# Patient Record
Sex: Female | Born: 1955 | Race: White | Hispanic: No | Smoking: Current every day smoker
Health system: Southern US, Community
[De-identification: ages and names within clinical notes are randomized; demographics above are authoritative.]

## PROBLEM LIST (undated history)

## (undated) DIAGNOSIS — I4819 Other persistent atrial fibrillation: Secondary | ICD-10-CM

## (undated) DIAGNOSIS — Z951 Presence of aortocoronary bypass graft: Secondary | ICD-10-CM

## (undated) DIAGNOSIS — R519 Headache, unspecified: Secondary | ICD-10-CM

## (undated) DIAGNOSIS — I4891 Unspecified atrial fibrillation: Secondary | ICD-10-CM

## (undated) DIAGNOSIS — I10 Essential (primary) hypertension: Secondary | ICD-10-CM

## (undated) DIAGNOSIS — R51 Headache: Secondary | ICD-10-CM

## (undated) DIAGNOSIS — I63511 Cerebral infarction due to unspecified occlusion or stenosis of right middle cerebral artery: Secondary | ICD-10-CM

## (undated) DIAGNOSIS — F329 Major depressive disorder, single episode, unspecified: Secondary | ICD-10-CM

## (undated) DIAGNOSIS — E785 Hyperlipidemia, unspecified: Secondary | ICD-10-CM

## (undated) DIAGNOSIS — R918 Other nonspecific abnormal finding of lung field: Secondary | ICD-10-CM

## (undated) DIAGNOSIS — F32A Depression, unspecified: Secondary | ICD-10-CM

## (undated) DIAGNOSIS — J449 Chronic obstructive pulmonary disease, unspecified: Secondary | ICD-10-CM

## (undated) DIAGNOSIS — I429 Cardiomyopathy, unspecified: Secondary | ICD-10-CM

## (undated) DIAGNOSIS — Z9289 Personal history of other medical treatment: Secondary | ICD-10-CM

## (undated) DIAGNOSIS — I2510 Atherosclerotic heart disease of native coronary artery without angina pectoris: Secondary | ICD-10-CM

## (undated) DIAGNOSIS — Z590 Homelessness: Secondary | ICD-10-CM

## (undated) DIAGNOSIS — Z8614 Personal history of Methicillin resistant Staphylococcus aureus infection: Secondary | ICD-10-CM

## (undated) DIAGNOSIS — IMO0002 Reserved for concepts with insufficient information to code with codable children: Secondary | ICD-10-CM

## (undated) DIAGNOSIS — L309 Dermatitis, unspecified: Secondary | ICD-10-CM

## (undated) DIAGNOSIS — K219 Gastro-esophageal reflux disease without esophagitis: Secondary | ICD-10-CM

## (undated) DIAGNOSIS — G4730 Sleep apnea, unspecified: Secondary | ICD-10-CM

## (undated) DIAGNOSIS — E039 Hypothyroidism, unspecified: Secondary | ICD-10-CM

## (undated) HISTORY — DX: Other nonspecific abnormal finding of lung field: R91.8

## (undated) HISTORY — DX: Chronic obstructive pulmonary disease, unspecified: J44.9

## (undated) HISTORY — PX: KNEE ARTHROSCOPY: SHX127

## (undated) HISTORY — DX: Personal history of Methicillin resistant Staphylococcus aureus infection: Z86.14

## (undated) HISTORY — PX: OTHER SURGICAL HISTORY: SHX169

## (undated) HISTORY — DX: Other persistent atrial fibrillation: I48.19

## (undated) HISTORY — DX: Sleep apnea, unspecified: G47.30

## (undated) HISTORY — PX: CORONARY ARTERY BYPASS GRAFT: SHX141

## (undated) HISTORY — DX: Reserved for concepts with insufficient information to code with codable children: IMO0002

## (undated) HISTORY — DX: Cerebral infarction due to unspecified occlusion or stenosis of right middle cerebral artery: I63.511

## (undated) HISTORY — PX: BLADDER SURGERY: SHX569

## (undated) HISTORY — DX: Homelessness: Z59.0

## (undated) HISTORY — DX: Hyperlipidemia, unspecified: E78.5

## (undated) HISTORY — DX: Hypothyroidism, unspecified: E03.9

## (undated) HISTORY — PX: CHEST WALL RECONSTRUCTION: SHX1339

## (undated) HISTORY — PX: HERNIA REPAIR: SHX51

---

## 1998-05-11 ENCOUNTER — Ambulatory Visit (HOSPITAL_BASED_OUTPATIENT_CLINIC_OR_DEPARTMENT_OTHER): Admission: RE | Admit: 1998-05-11 | Discharge: 1998-05-11 | Payer: Self-pay | Admitting: Orthopaedic Surgery

## 1999-03-06 ENCOUNTER — Other Ambulatory Visit: Admission: RE | Admit: 1999-03-06 | Discharge: 1999-03-06 | Payer: Self-pay | Admitting: Obstetrics and Gynecology

## 2000-04-18 ENCOUNTER — Other Ambulatory Visit: Admission: RE | Admit: 2000-04-18 | Discharge: 2000-04-18 | Payer: Self-pay | Admitting: Obstetrics and Gynecology

## 2001-05-13 ENCOUNTER — Other Ambulatory Visit: Admission: RE | Admit: 2001-05-13 | Discharge: 2001-05-13 | Payer: Self-pay | Admitting: Obstetrics and Gynecology

## 2001-09-25 ENCOUNTER — Encounter: Admission: RE | Admit: 2001-09-25 | Discharge: 2001-09-25 | Payer: Self-pay | Admitting: Otolaryngology

## 2001-09-25 ENCOUNTER — Encounter: Payer: Self-pay | Admitting: Otolaryngology

## 2002-01-08 ENCOUNTER — Ambulatory Visit (HOSPITAL_COMMUNITY): Admission: RE | Admit: 2002-01-08 | Discharge: 2002-01-08 | Payer: Self-pay | Admitting: Cardiology

## 2002-01-08 ENCOUNTER — Encounter: Payer: Self-pay | Admitting: Cardiology

## 2002-01-21 ENCOUNTER — Ambulatory Visit (HOSPITAL_BASED_OUTPATIENT_CLINIC_OR_DEPARTMENT_OTHER): Admission: RE | Admit: 2002-01-21 | Discharge: 2002-01-21 | Payer: Self-pay | Admitting: Family Medicine

## 2002-03-29 ENCOUNTER — Ambulatory Visit (HOSPITAL_BASED_OUTPATIENT_CLINIC_OR_DEPARTMENT_OTHER): Admission: RE | Admit: 2002-03-29 | Discharge: 2002-03-29 | Payer: Self-pay | Admitting: Pulmonary Disease

## 2004-07-27 ENCOUNTER — Ambulatory Visit: Payer: Self-pay | Admitting: Internal Medicine

## 2004-08-03 ENCOUNTER — Ambulatory Visit: Payer: Self-pay | Admitting: Internal Medicine

## 2004-08-27 ENCOUNTER — Ambulatory Visit: Payer: Self-pay | Admitting: Internal Medicine

## 2004-10-02 ENCOUNTER — Ambulatory Visit: Payer: Self-pay | Admitting: Internal Medicine

## 2004-10-30 ENCOUNTER — Ambulatory Visit: Payer: Self-pay | Admitting: Internal Medicine

## 2004-11-12 ENCOUNTER — Ambulatory Visit (HOSPITAL_COMMUNITY): Admission: RE | Admit: 2004-11-12 | Discharge: 2004-11-12 | Payer: Self-pay | Admitting: Internal Medicine

## 2005-02-06 ENCOUNTER — Ambulatory Visit: Payer: Self-pay | Admitting: Internal Medicine

## 2005-03-06 ENCOUNTER — Ambulatory Visit: Payer: Self-pay | Admitting: Endocrinology

## 2005-03-11 ENCOUNTER — Ambulatory Visit: Payer: Self-pay | Admitting: Internal Medicine

## 2005-03-13 ENCOUNTER — Ambulatory Visit (HOSPITAL_COMMUNITY): Admission: RE | Admit: 2005-03-13 | Discharge: 2005-03-13 | Payer: Self-pay | Admitting: Endocrinology

## 2005-03-18 ENCOUNTER — Encounter (HOSPITAL_COMMUNITY): Admission: RE | Admit: 2005-03-18 | Discharge: 2005-06-04 | Payer: Self-pay | Admitting: Endocrinology

## 2005-04-04 ENCOUNTER — Ambulatory Visit (HOSPITAL_COMMUNITY): Admission: RE | Admit: 2005-04-04 | Discharge: 2005-04-04 | Payer: Self-pay | Admitting: Endocrinology

## 2005-04-04 ENCOUNTER — Encounter (INDEPENDENT_AMBULATORY_CARE_PROVIDER_SITE_OTHER): Payer: Self-pay | Admitting: Specialist

## 2005-04-12 ENCOUNTER — Ambulatory Visit (HOSPITAL_COMMUNITY): Admission: RE | Admit: 2005-04-12 | Discharge: 2005-04-12 | Payer: Self-pay | Admitting: Endocrinology

## 2005-09-10 ENCOUNTER — Ambulatory Visit: Payer: Self-pay | Admitting: Internal Medicine

## 2005-09-12 ENCOUNTER — Ambulatory Visit: Payer: Self-pay | Admitting: Endocrinology

## 2005-09-26 ENCOUNTER — Other Ambulatory Visit: Admission: RE | Admit: 2005-09-26 | Discharge: 2005-09-26 | Payer: Self-pay | Admitting: Obstetrics & Gynecology

## 2005-11-05 ENCOUNTER — Inpatient Hospital Stay (HOSPITAL_COMMUNITY): Admission: EM | Admit: 2005-11-05 | Discharge: 2005-11-09 | Payer: Self-pay | Admitting: Emergency Medicine

## 2005-11-05 ENCOUNTER — Ambulatory Visit: Payer: Self-pay | Admitting: Cardiology

## 2005-11-07 ENCOUNTER — Encounter: Payer: Self-pay | Admitting: Internal Medicine

## 2005-11-20 ENCOUNTER — Ambulatory Visit: Payer: Self-pay | Admitting: Cardiology

## 2005-11-25 ENCOUNTER — Ambulatory Visit: Payer: Self-pay | Admitting: Endocrinology

## 2005-12-02 ENCOUNTER — Ambulatory Visit: Payer: Self-pay | Admitting: Internal Medicine

## 2005-12-25 ENCOUNTER — Ambulatory Visit: Payer: Self-pay | Admitting: Cardiology

## 2005-12-27 ENCOUNTER — Ambulatory Visit: Payer: Self-pay | Admitting: Internal Medicine

## 2006-01-08 ENCOUNTER — Ambulatory Visit: Payer: Self-pay | Admitting: Cardiology

## 2006-01-08 ENCOUNTER — Ambulatory Visit: Payer: Self-pay | Admitting: Endocrinology

## 2006-01-23 ENCOUNTER — Ambulatory Visit: Payer: Self-pay | Admitting: Cardiology

## 2006-04-15 ENCOUNTER — Ambulatory Visit: Payer: Self-pay | Admitting: Cardiology

## 2006-05-06 ENCOUNTER — Ambulatory Visit: Payer: Self-pay | Admitting: Internal Medicine

## 2006-05-28 ENCOUNTER — Ambulatory Visit: Payer: Self-pay | Admitting: Internal Medicine

## 2006-06-02 ENCOUNTER — Ambulatory Visit: Payer: Self-pay | Admitting: Internal Medicine

## 2006-06-03 ENCOUNTER — Encounter: Admission: RE | Admit: 2006-06-03 | Discharge: 2006-06-03 | Payer: Self-pay | Admitting: Internal Medicine

## 2006-06-13 ENCOUNTER — Ambulatory Visit: Payer: Self-pay | Admitting: Cardiovascular Disease

## 2006-06-13 ENCOUNTER — Inpatient Hospital Stay (HOSPITAL_COMMUNITY): Admission: EM | Admit: 2006-06-13 | Discharge: 2006-06-17 | Payer: Self-pay | Admitting: Emergency Medicine

## 2006-06-19 ENCOUNTER — Ambulatory Visit: Payer: Self-pay | Admitting: Endocrinology

## 2006-07-03 ENCOUNTER — Observation Stay (HOSPITAL_COMMUNITY): Admission: AD | Admit: 2006-07-03 | Discharge: 2006-07-04 | Payer: Self-pay | Admitting: Cardiology

## 2006-07-03 ENCOUNTER — Ambulatory Visit: Payer: Self-pay | Admitting: Cardiology

## 2006-07-11 ENCOUNTER — Ambulatory Visit: Payer: Self-pay | Admitting: Cardiology

## 2006-07-21 ENCOUNTER — Ambulatory Visit: Payer: Self-pay

## 2006-07-30 ENCOUNTER — Ambulatory Visit: Payer: Self-pay | Admitting: Cardiology

## 2006-08-06 ENCOUNTER — Inpatient Hospital Stay (HOSPITAL_COMMUNITY)
Admission: RE | Admit: 2006-08-06 | Discharge: 2006-08-11 | Payer: Self-pay | Admitting: Thoracic Surgery (Cardiothoracic Vascular Surgery)

## 2006-08-26 ENCOUNTER — Ambulatory Visit: Payer: Self-pay | Admitting: Cardiology

## 2006-09-08 ENCOUNTER — Ambulatory Visit: Payer: Self-pay | Admitting: Cardiology

## 2006-09-14 ENCOUNTER — Inpatient Hospital Stay (HOSPITAL_COMMUNITY): Admission: EM | Admit: 2006-09-14 | Discharge: 2006-10-01 | Payer: Self-pay | Admitting: Emergency Medicine

## 2006-09-18 ENCOUNTER — Ambulatory Visit: Payer: Self-pay | Admitting: Infectious Diseases

## 2006-10-13 ENCOUNTER — Encounter
Admission: RE | Admit: 2006-10-13 | Discharge: 2006-10-13 | Payer: Self-pay | Admitting: Thoracic Surgery (Cardiothoracic Vascular Surgery)

## 2006-10-13 ENCOUNTER — Ambulatory Visit: Payer: Self-pay | Admitting: Thoracic Surgery (Cardiothoracic Vascular Surgery)

## 2006-10-27 ENCOUNTER — Ambulatory Visit: Payer: Self-pay | Admitting: Infectious Diseases

## 2006-10-27 LAB — CONVERTED CEMR LAB
Eosinophils Absolute: 0.4 10*3/uL (ref 0.0–0.7)
Eosinophils Relative: 8 % — ABNORMAL HIGH (ref 0–5)
HCT: 36.6 % (ref 36.0–46.0)
Lymphs Abs: 1.2 10*3/uL (ref 0.7–3.3)
MCV: 88.8 fL (ref 78.0–100.0)
Platelets: 239 10*3/uL (ref 150–400)
RDW: 15.3 % — ABNORMAL HIGH (ref 11.5–14.0)
Sed Rate: 46 mm/hr — ABNORMAL HIGH (ref 0–22)
WBC: 5.6 10*3/uL (ref 4.0–10.5)

## 2006-10-28 ENCOUNTER — Ambulatory Visit: Payer: Self-pay | Admitting: Internal Medicine

## 2006-10-28 DIAGNOSIS — E039 Hypothyroidism, unspecified: Secondary | ICD-10-CM

## 2006-10-28 DIAGNOSIS — J45909 Unspecified asthma, uncomplicated: Secondary | ICD-10-CM | POA: Insufficient documentation

## 2006-10-28 DIAGNOSIS — I2510 Atherosclerotic heart disease of native coronary artery without angina pectoris: Secondary | ICD-10-CM | POA: Insufficient documentation

## 2006-10-28 DIAGNOSIS — G4733 Obstructive sleep apnea (adult) (pediatric): Secondary | ICD-10-CM | POA: Insufficient documentation

## 2006-10-28 DIAGNOSIS — I10 Essential (primary) hypertension: Secondary | ICD-10-CM | POA: Insufficient documentation

## 2006-11-26 ENCOUNTER — Ambulatory Visit: Payer: Self-pay | Admitting: Internal Medicine

## 2006-12-08 ENCOUNTER — Ambulatory Visit: Payer: Self-pay | Admitting: Thoracic Surgery (Cardiothoracic Vascular Surgery)

## 2006-12-15 ENCOUNTER — Encounter
Admission: RE | Admit: 2006-12-15 | Discharge: 2006-12-15 | Payer: Self-pay | Admitting: Thoracic Surgery (Cardiothoracic Vascular Surgery)

## 2006-12-15 ENCOUNTER — Ambulatory Visit: Payer: Self-pay | Admitting: Thoracic Surgery (Cardiothoracic Vascular Surgery)

## 2006-12-30 ENCOUNTER — Ambulatory Visit: Payer: Self-pay | Admitting: Endocrinology

## 2007-01-01 ENCOUNTER — Encounter (HOSPITAL_COMMUNITY): Admission: RE | Admit: 2007-01-01 | Discharge: 2007-02-26 | Payer: Self-pay | Admitting: Cardiology

## 2007-02-06 ENCOUNTER — Encounter: Admission: RE | Admit: 2007-02-06 | Discharge: 2007-02-06 | Payer: Self-pay | Admitting: Cardiothoracic Surgery

## 2007-02-06 ENCOUNTER — Ambulatory Visit: Payer: Self-pay | Admitting: Cardiothoracic Surgery

## 2007-02-13 ENCOUNTER — Ambulatory Visit: Payer: Self-pay | Admitting: Cardiothoracic Surgery

## 2007-02-23 ENCOUNTER — Ambulatory Visit: Payer: Self-pay | Admitting: Thoracic Surgery (Cardiothoracic Vascular Surgery)

## 2007-03-02 ENCOUNTER — Ambulatory Visit: Payer: Self-pay | Admitting: Internal Medicine

## 2007-03-17 ENCOUNTER — Ambulatory Visit: Payer: Self-pay | Admitting: Cardiology

## 2007-03-23 ENCOUNTER — Ambulatory Visit: Payer: Self-pay | Admitting: Internal Medicine

## 2007-03-23 ENCOUNTER — Ambulatory Visit: Payer: Self-pay | Admitting: Cardiology

## 2007-03-23 LAB — CONVERTED CEMR LAB
ALT: 17 units/L (ref 0–35)
AST: 18 units/L (ref 0–37)
Basophils Relative: 0.3 % (ref 0.0–1.0)
Bilirubin, Direct: 0.1 mg/dL (ref 0.0–0.3)
CO2: 31 meq/L (ref 19–32)
Calcium: 9.8 mg/dL (ref 8.4–10.5)
Chloride: 104 meq/L (ref 96–112)
Creatinine, Ser: 0.9 mg/dL (ref 0.4–1.2)
Eosinophils Relative: 2.4 % (ref 0.0–5.0)
Glucose, Bld: 112 mg/dL — ABNORMAL HIGH (ref 70–99)
Lymphocytes Relative: 14.1 % (ref 12.0–46.0)
Platelets: 220 10*3/uL (ref 150–400)
RBC: 3.64 M/uL — ABNORMAL LOW (ref 3.87–5.11)
RDW: 12.2 % (ref 11.5–14.6)
Total CHOL/HDL Ratio: 4.2
Total Protein: 7.3 g/dL (ref 6.0–8.3)
Triglycerides: 134 mg/dL (ref 0–149)
VLDL: 27 mg/dL (ref 0–40)
WBC: 5.7 10*3/uL (ref 4.5–10.5)

## 2007-03-25 ENCOUNTER — Encounter: Admission: RE | Admit: 2007-03-25 | Discharge: 2007-03-25 | Payer: Self-pay | Admitting: Internal Medicine

## 2007-03-25 ENCOUNTER — Encounter: Payer: Self-pay | Admitting: Internal Medicine

## 2007-03-30 ENCOUNTER — Ambulatory Visit: Payer: Self-pay | Admitting: Internal Medicine

## 2007-03-30 ENCOUNTER — Encounter: Payer: Self-pay | Admitting: Internal Medicine

## 2007-03-30 DIAGNOSIS — G43909 Migraine, unspecified, not intractable, without status migrainosus: Secondary | ICD-10-CM | POA: Insufficient documentation

## 2007-04-06 ENCOUNTER — Ambulatory Visit: Payer: Self-pay | Admitting: Thoracic Surgery (Cardiothoracic Vascular Surgery)

## 2007-06-02 ENCOUNTER — Ambulatory Visit: Payer: Self-pay | Admitting: Internal Medicine

## 2007-06-02 ENCOUNTER — Encounter: Payer: Self-pay | Admitting: Internal Medicine

## 2007-06-09 ENCOUNTER — Ambulatory Visit: Payer: Self-pay | Admitting: Cardiology

## 2007-06-09 LAB — CONVERTED CEMR LAB
Albumin: 3.3 g/dL — ABNORMAL LOW (ref 3.5–5.2)
Bilirubin, Direct: 0.1 mg/dL (ref 0.0–0.3)
HDL: 44.2 mg/dL (ref >39.0)
Total Bilirubin: 0.6 mg/dL (ref 0.3–1.2)
Total CHOL/HDL Ratio: 3.7
Triglycerides: 116 mg/dL (ref 0–149)
VLDL: 23 mg/dL (ref 0–40)

## 2007-06-15 ENCOUNTER — Ambulatory Visit: Payer: Self-pay | Admitting: Cardiology

## 2007-06-24 ENCOUNTER — Encounter
Admission: RE | Admit: 2007-06-24 | Discharge: 2007-06-24 | Payer: Self-pay | Admitting: Thoracic Surgery (Cardiothoracic Vascular Surgery)

## 2007-07-06 ENCOUNTER — Ambulatory Visit: Payer: Self-pay | Admitting: Internal Medicine

## 2007-07-06 DIAGNOSIS — F4321 Adjustment disorder with depressed mood: Secondary | ICD-10-CM

## 2007-07-06 DIAGNOSIS — R071 Chest pain on breathing: Secondary | ICD-10-CM

## 2007-07-31 ENCOUNTER — Ambulatory Visit: Payer: Self-pay | Admitting: Internal Medicine

## 2007-08-05 ENCOUNTER — Ambulatory Visit: Payer: Self-pay | Admitting: Internal Medicine

## 2007-08-10 ENCOUNTER — Ambulatory Visit: Payer: Self-pay | Admitting: Physical Medicine & Rehabilitation

## 2007-08-10 ENCOUNTER — Encounter
Admission: RE | Admit: 2007-08-10 | Discharge: 2007-08-11 | Payer: Self-pay | Admitting: Physical Medicine & Rehabilitation

## 2007-09-14 ENCOUNTER — Ambulatory Visit: Payer: Self-pay | Admitting: Physical Medicine & Rehabilitation

## 2007-10-14 ENCOUNTER — Telehealth (INDEPENDENT_AMBULATORY_CARE_PROVIDER_SITE_OTHER): Payer: Self-pay | Admitting: *Deleted

## 2007-10-14 ENCOUNTER — Telehealth: Payer: Self-pay | Admitting: Internal Medicine

## 2007-10-19 ENCOUNTER — Telehealth: Payer: Self-pay | Admitting: *Deleted

## 2007-11-03 ENCOUNTER — Ambulatory Visit: Payer: Self-pay | Admitting: Internal Medicine

## 2007-12-02 ENCOUNTER — Encounter: Payer: Self-pay | Admitting: Internal Medicine

## 2008-01-28 ENCOUNTER — Encounter: Payer: Self-pay | Admitting: Internal Medicine

## 2008-02-04 ENCOUNTER — Telehealth: Payer: Self-pay | Admitting: *Deleted

## 2008-04-18 ENCOUNTER — Telehealth: Payer: Self-pay | Admitting: *Deleted

## 2008-05-11 ENCOUNTER — Ambulatory Visit: Payer: Self-pay | Admitting: Cardiology

## 2008-07-06 ENCOUNTER — Telehealth: Payer: Self-pay | Admitting: Internal Medicine

## 2008-07-12 ENCOUNTER — Ambulatory Visit: Payer: Self-pay | Admitting: Internal Medicine

## 2008-08-17 ENCOUNTER — Ambulatory Visit: Payer: Self-pay | Admitting: Internal Medicine

## 2008-10-25 ENCOUNTER — Ambulatory Visit: Payer: Self-pay | Admitting: Internal Medicine

## 2008-11-01 LAB — CONVERTED CEMR LAB
AST: 26 units/L (ref 0–37)
Albumin: 3.6 g/dL (ref 3.5–5.2)
Alkaline Phosphatase: 66 units/L (ref 39–117)
BUN: 14 mg/dL (ref 6–23)
Chloride: 109 meq/L (ref 96–112)
Cholesterol: 145 mg/dL (ref 0–200)
Creatinine, Ser: 0.9 mg/dL (ref 0.4–1.2)
GFR calc non Af Amer: 70 mL/min
Glucose, Bld: 100 mg/dL — ABNORMAL HIGH (ref 70–99)
LDL Cholesterol: 75 mg/dL (ref 0–99)
TSH: 0.72 microintl units/mL (ref 0.35–5.50)
Total Protein: 6.8 g/dL (ref 6.0–8.3)
Triglycerides: 163 mg/dL — ABNORMAL HIGH (ref 0–149)
VLDL: 33 mg/dL (ref 0–40)

## 2008-11-28 ENCOUNTER — Telehealth: Payer: Self-pay | Admitting: Internal Medicine

## 2008-12-06 ENCOUNTER — Ambulatory Visit: Payer: Self-pay | Admitting: Internal Medicine

## 2008-12-07 LAB — CONVERTED CEMR LAB
BUN: 10 mg/dL (ref 6–23)
Calcium: 7.7 mg/dL — ABNORMAL LOW (ref 8.4–10.5)
Chloride: 105 meq/L (ref 96–112)
Creatinine, Ser: 0.9 mg/dL (ref 0.4–1.2)
GFR calc non Af Amer: 70.11 mL/min (ref >60)

## 2009-01-02 ENCOUNTER — Telehealth (INDEPENDENT_AMBULATORY_CARE_PROVIDER_SITE_OTHER): Payer: Self-pay | Admitting: *Deleted

## 2009-01-05 ENCOUNTER — Ambulatory Visit: Payer: Self-pay | Admitting: Internal Medicine

## 2009-01-05 LAB — CONVERTED CEMR LAB
Calcium Ionized: 1.32 mmol/L (ref 1.12–1.32)
Magnesium: 2 mg/dL (ref 1.5–2.5)

## 2009-01-09 ENCOUNTER — Telehealth: Payer: Self-pay | Admitting: Internal Medicine

## 2009-01-09 LAB — CONVERTED CEMR LAB
Calcium: 9.6 mg/dL (ref 8.4–10.5)
GFR calc non Af Amer: 70.09 mL/min (ref >60)
Potassium: 4.5 meq/L (ref 3.5–5.1)
Sodium: 139 meq/L (ref 135–145)

## 2009-01-16 ENCOUNTER — Telehealth: Payer: Self-pay | Admitting: Internal Medicine

## 2009-01-26 ENCOUNTER — Telehealth: Payer: Self-pay | Admitting: Internal Medicine

## 2009-01-29 ENCOUNTER — Encounter: Payer: Self-pay | Admitting: Internal Medicine

## 2009-02-10 ENCOUNTER — Telehealth: Payer: Self-pay | Admitting: Internal Medicine

## 2009-02-15 ENCOUNTER — Ambulatory Visit: Payer: Self-pay | Admitting: Internal Medicine

## 2009-02-16 ENCOUNTER — Encounter: Payer: Self-pay | Admitting: Internal Medicine

## 2009-03-01 ENCOUNTER — Telehealth (INDEPENDENT_AMBULATORY_CARE_PROVIDER_SITE_OTHER): Payer: Self-pay | Admitting: *Deleted

## 2009-03-01 ENCOUNTER — Telehealth: Payer: Self-pay | Admitting: Internal Medicine

## 2009-04-03 ENCOUNTER — Telehealth: Payer: Self-pay | Admitting: Internal Medicine

## 2009-04-18 ENCOUNTER — Ambulatory Visit: Payer: Self-pay | Admitting: Thoracic Surgery (Cardiothoracic Vascular Surgery)

## 2009-05-10 ENCOUNTER — Telehealth: Payer: Self-pay | Admitting: Internal Medicine

## 2009-05-23 ENCOUNTER — Encounter: Payer: Self-pay | Admitting: Internal Medicine

## 2009-05-29 ENCOUNTER — Telehealth (INDEPENDENT_AMBULATORY_CARE_PROVIDER_SITE_OTHER): Payer: Self-pay | Admitting: *Deleted

## 2009-07-10 ENCOUNTER — Encounter: Payer: Self-pay | Admitting: Internal Medicine

## 2009-07-13 ENCOUNTER — Ambulatory Visit: Payer: Self-pay | Admitting: Internal Medicine

## 2009-07-19 ENCOUNTER — Telehealth (INDEPENDENT_AMBULATORY_CARE_PROVIDER_SITE_OTHER): Payer: Self-pay | Admitting: *Deleted

## 2009-07-21 ENCOUNTER — Ambulatory Visit: Payer: Self-pay | Admitting: Internal Medicine

## 2009-07-21 LAB — CONVERTED CEMR LAB
Basophils Relative: 0.9 % (ref 0.0–3.0)
Eosinophils Relative: 2.5 % (ref 0.0–5.0)
Lymphocytes Relative: 20.1 % (ref 12.0–46.0)
Monocytes Absolute: 0.7 10*3/uL (ref 0.1–1.0)
Neutrophils Relative %: 67 % (ref 43.0–77.0)
Platelets: 238 10*3/uL (ref 150.0–400.0)
Pro B Natriuretic peptide (BNP): 162 pg/mL — ABNORMAL HIGH (ref 0.0–100.0)
RBC: 3.86 M/uL — ABNORMAL LOW (ref 3.87–5.11)
WBC: 7.3 10*3/uL (ref 4.5–10.5)

## 2009-08-14 ENCOUNTER — Telehealth: Payer: Self-pay | Admitting: *Deleted

## 2009-08-22 ENCOUNTER — Ambulatory Visit: Payer: Self-pay | Admitting: Internal Medicine

## 2009-12-20 ENCOUNTER — Telehealth (INDEPENDENT_AMBULATORY_CARE_PROVIDER_SITE_OTHER): Payer: Self-pay | Admitting: *Deleted

## 2009-12-20 ENCOUNTER — Ambulatory Visit: Payer: Self-pay | Admitting: Internal Medicine

## 2009-12-22 LAB — CONVERTED CEMR LAB
CO2: 30 meq/L (ref 19–32)
Calcium: 8.6 mg/dL (ref 8.4–10.5)
GFR calc non Af Amer: 79.99 mL/min (ref >60)
Glucose, Bld: 105 mg/dL — ABNORMAL HIGH (ref 70–99)
Potassium: 3.6 meq/L (ref 3.5–5.1)
Sodium: 141 meq/L (ref 135–145)

## 2009-12-27 ENCOUNTER — Encounter: Payer: Self-pay | Admitting: Internal Medicine

## 2009-12-27 ENCOUNTER — Ambulatory Visit (HOSPITAL_COMMUNITY): Admission: RE | Admit: 2009-12-27 | Discharge: 2009-12-27 | Payer: Self-pay | Admitting: Internal Medicine

## 2009-12-27 ENCOUNTER — Ambulatory Visit: Payer: Self-pay | Admitting: Cardiology

## 2010-01-15 ENCOUNTER — Ambulatory Visit: Payer: Self-pay | Admitting: Internal Medicine

## 2010-01-15 LAB — CONVERTED CEMR LAB
ALT: 32 units/L (ref 0–35)
Bilirubin, Direct: 0.1 mg/dL (ref 0.0–0.3)
Direct LDL: 153 mg/dL
HDL: 47.2 mg/dL (ref >39.00)
TSH: 11.16 microintl units/mL — ABNORMAL HIGH (ref 0.35–5.50)
Total Bilirubin: 0.7 mg/dL (ref 0.3–1.2)
Total Protein: 7 g/dL (ref 6.0–8.3)
Triglycerides: 126 mg/dL (ref 0.0–149.0)
VLDL: 25.2 mg/dL (ref 0.0–40.0)

## 2010-01-19 ENCOUNTER — Ambulatory Visit: Payer: Self-pay | Admitting: Internal Medicine

## 2010-01-24 ENCOUNTER — Ambulatory Visit: Payer: Self-pay | Admitting: Internal Medicine

## 2010-03-02 ENCOUNTER — Ambulatory Visit: Payer: Self-pay | Admitting: Internal Medicine

## 2010-03-05 ENCOUNTER — Encounter: Payer: Self-pay | Admitting: Internal Medicine

## 2010-03-16 ENCOUNTER — Encounter: Payer: Self-pay | Admitting: Internal Medicine

## 2010-03-27 ENCOUNTER — Ambulatory Visit: Payer: Self-pay | Admitting: Internal Medicine

## 2010-04-03 LAB — CONVERTED CEMR LAB
ALT: 23 units/L (ref 0–35)
AST: 18 units/L (ref 0–37)
Bilirubin, Direct: 0.1 mg/dL (ref 0.0–0.3)
Cholesterol: 137 mg/dL (ref 0–200)
Total Bilirubin: 0.7 mg/dL (ref 0.3–1.2)
Total Protein: 6.9 g/dL (ref 6.0–8.3)

## 2010-06-14 ENCOUNTER — Ambulatory Visit: Payer: Self-pay | Admitting: Internal Medicine

## 2010-06-15 ENCOUNTER — Telehealth: Payer: Self-pay | Admitting: *Deleted

## 2010-06-15 LAB — CONVERTED CEMR LAB
BUN: 10 mg/dL (ref 6–23)
Basophils Absolute: 0 10*3/uL (ref 0.0–0.1)
CO2: 29 meq/L (ref 19–32)
Calcium: 8.2 mg/dL — ABNORMAL LOW (ref 8.4–10.5)
Creatinine, Ser: 0.9 mg/dL (ref 0.4–1.2)
Eosinophils Absolute: 0.2 10*3/uL (ref 0.0–0.7)
GFR calc non Af Amer: 73.45 mL/min (ref >60)
Glucose, Bld: 74 mg/dL (ref 70–99)
Hgb A1c MFr Bld: 5.4 % (ref 4.6–6.5)
Lymphocytes Relative: 19.1 % (ref 12.0–46.0)
MCHC: 35.1 g/dL (ref 30.0–36.0)
Neutro Abs: 4.8 10*3/uL (ref 1.4–7.7)
Neutrophils Relative %: 69.1 % (ref 43.0–77.0)
Platelets: 262 10*3/uL (ref 150.0–400.0)
RDW: 13.4 % (ref 11.5–14.6)
TSH: 0.53 microintl units/mL (ref 0.35–5.50)

## 2010-06-26 ENCOUNTER — Ambulatory Visit: Payer: Self-pay | Admitting: Internal Medicine

## 2010-07-04 ENCOUNTER — Telehealth: Payer: Self-pay | Admitting: Internal Medicine

## 2010-07-04 LAB — CONVERTED CEMR LAB
BUN: 15 mg/dL (ref 6–23)
Calcium: 9.5 mg/dL (ref 8.4–10.5)
Chloride: 108 meq/L (ref 96–112)
Creatinine, Ser: 0.9 mg/dL (ref 0.4–1.2)
Magnesium: 1.6 mg/dL (ref 1.5–2.5)

## 2010-07-13 ENCOUNTER — Telehealth: Payer: Self-pay | Admitting: Cardiology

## 2010-08-06 ENCOUNTER — Ambulatory Visit: Payer: Self-pay | Admitting: Cardiology

## 2010-08-10 ENCOUNTER — Ambulatory Visit: Payer: Self-pay | Admitting: Cardiology

## 2010-08-10 ENCOUNTER — Ambulatory Visit (HOSPITAL_COMMUNITY)
Admission: RE | Admit: 2010-08-10 | Discharge: 2010-08-10 | Payer: Self-pay | Source: Home / Self Care | Admitting: Cardiology

## 2010-08-13 ENCOUNTER — Encounter: Payer: Self-pay | Admitting: Cardiology

## 2010-08-16 ENCOUNTER — Telehealth: Payer: Self-pay | Admitting: Cardiology

## 2010-08-17 ENCOUNTER — Encounter: Payer: Self-pay | Admitting: Cardiology

## 2010-08-22 ENCOUNTER — Encounter: Payer: Self-pay | Admitting: Cardiology

## 2010-08-24 ENCOUNTER — Ambulatory Visit: Payer: Self-pay | Admitting: Cardiology

## 2010-08-29 ENCOUNTER — Ambulatory Visit: Payer: Self-pay | Admitting: Internal Medicine

## 2010-09-04 ENCOUNTER — Telehealth (INDEPENDENT_AMBULATORY_CARE_PROVIDER_SITE_OTHER): Payer: Self-pay | Admitting: *Deleted

## 2010-09-05 ENCOUNTER — Ambulatory Visit: Payer: Self-pay | Admitting: Cardiology

## 2010-09-09 HISTORY — PX: CARDIAC CATHETERIZATION: SHX172

## 2010-09-22 ENCOUNTER — Ambulatory Visit
Admission: RE | Admit: 2010-09-22 | Discharge: 2010-09-22 | Payer: Self-pay | Source: Home / Self Care | Attending: Family Medicine | Admitting: Family Medicine

## 2010-09-22 DIAGNOSIS — K115 Sialolithiasis: Secondary | ICD-10-CM | POA: Insufficient documentation

## 2010-09-24 ENCOUNTER — Other Ambulatory Visit: Payer: Self-pay | Admitting: Cardiology

## 2010-09-24 ENCOUNTER — Ambulatory Visit: Admission: RE | Admit: 2010-09-24 | Discharge: 2010-09-24 | Payer: Self-pay | Source: Home / Self Care

## 2010-09-25 LAB — BASIC METABOLIC PANEL
BUN: 15 mg/dL (ref 6–23)
CO2: 27 mEq/L (ref 19–32)
Calcium: 9.4 mg/dL (ref 8.4–10.5)
Chloride: 105 mEq/L (ref 96–112)
Creatinine, Ser: 1 mg/dL (ref 0.4–1.2)
GFR: 65.41 mL/min (ref >60.00)
Glucose, Bld: 76 mg/dL (ref 70–99)
Potassium: 3.8 mEq/L (ref 3.5–5.1)
Sodium: 141 mEq/L (ref 135–145)

## 2010-09-25 LAB — CBC WITH DIFFERENTIAL/PLATELET
Basophils Absolute: 0 10*3/uL (ref 0.0–0.1)
Basophils Relative: 0.5 % (ref 0.0–3.0)
Eosinophils Absolute: 0.2 10*3/uL (ref 0.0–0.7)
Eosinophils Relative: 3.2 % (ref 0.0–5.0)
HCT: 37.6 % (ref 36.0–46.0)
Hemoglobin: 13.1 g/dL (ref 12.0–15.0)
Lymphocytes Relative: 23 % (ref 12.0–46.0)
Lymphs Abs: 1.6 10*3/uL (ref 0.7–4.0)
MCHC: 34.8 g/dL (ref 30.0–36.0)
MCV: 92.6 fl (ref 78.0–100.0)
Monocytes Absolute: 0.6 10*3/uL (ref 0.1–1.0)
Monocytes Relative: 7.9 % (ref 3.0–12.0)
Neutro Abs: 4.7 10*3/uL (ref 1.4–7.7)
Neutrophils Relative %: 65.4 % (ref 43.0–77.0)
Platelets: 266 10*3/uL (ref 150.0–400.0)
RBC: 4.05 Mil/uL (ref 3.87–5.11)
RDW: 14.5 % (ref 11.5–14.6)
WBC: 7.1 10*3/uL (ref 4.5–10.5)

## 2010-09-25 LAB — PROTIME-INR
INR: 1.1 ratio — ABNORMAL HIGH (ref 0.8–1.0)
Prothrombin Time: 12.2 s (ref 10.2–12.4)

## 2010-09-27 ENCOUNTER — Ambulatory Visit
Admission: RE | Admit: 2010-09-27 | Discharge: 2010-09-27 | Payer: Self-pay | Source: Home / Self Care | Attending: Cardiology | Admitting: Cardiology

## 2010-09-29 ENCOUNTER — Encounter: Payer: Self-pay | Admitting: Endocrinology

## 2010-09-30 ENCOUNTER — Encounter: Payer: Self-pay | Admitting: Cardiology

## 2010-09-30 ENCOUNTER — Encounter: Payer: Self-pay | Admitting: Thoracic Surgery (Cardiothoracic Vascular Surgery)

## 2010-10-01 ENCOUNTER — Ambulatory Visit: Admit: 2010-10-01 | Payer: Self-pay | Admitting: Cardiology

## 2010-10-01 ENCOUNTER — Encounter: Payer: Self-pay | Admitting: Cardiology

## 2010-10-02 ENCOUNTER — Ambulatory Visit
Admission: RE | Admit: 2010-10-02 | Discharge: 2010-10-02 | Payer: Self-pay | Source: Home / Self Care | Attending: Internal Medicine | Admitting: Internal Medicine

## 2010-10-02 ENCOUNTER — Other Ambulatory Visit: Payer: Self-pay | Admitting: Internal Medicine

## 2010-10-02 LAB — MAGNESIUM: Magnesium: 1.7 mg/dL (ref 1.5–2.5)

## 2010-10-03 ENCOUNTER — Ambulatory Visit: Payer: Self-pay | Admitting: Cardiovascular Disease

## 2010-10-04 NOTE — Procedures (Addendum)
NAMEKENSLEI, HEARTY NO.:  [id number]  MEDICAL RECORD NO.:  [id number]          PATIENT TYPE:  OIB  LOCATION:  1962                         FACILITY:  [facility]  PHYSICIAN:  Arturo Morton. Riley Kill, MD, FACCDATE OF BIRTH:  Apr 23, 1956  DATE OF PROCEDURE:  09/27/2010 DATE OF DISCHARGE:  09/27/2010                           CARDIAC CATHETERIZATION   INDICATIONS:  Ms. Shackleton is a 55 year old.  She presents with some recurrent chest tightness associated with exertion.  She has been evaluated in the office.  CT has suggested COPD and central lobular emphysema with the upper limits of normal pulmonary artery.  However, her echocardiogram has demonstrated no increased transvalvular gradient across the tricuspid valve.  The patient has known obstructive sleep apnea and has been on CPAP.  She has had a V/Q scan to rule out a pulmonary MRI.  The current study was done to assess her grafts.  Her revascularization surgery was done in 2007.  She has been a longstanding smoker.  She was brought to the laboratory for further evaluation.  PROCEDURES: 1. Left heart catheterization. 2. Selective coronary arteriography. 3. Selective left ventriculography. 4. Saphenous vein graft angiography. 5. Selective left internal mammary angiography.  DESCRIPTION OF PROCEDURE:  She was brought to the Catheterization Laboratory and prepped and draped in the usual fashion.  We have extremely difficult time getting access to the femoral artery.  The vessels were really quite deep.  I took as about a part of an hour and we switched groins to the left side.  Ultimately, we were able to get access up to the left femoral artery and a 4-French sheath was placed. Views of the left and right coronary arteries were obtained in multiple angiographic projections.  The vein grafts and internal mammary were injected as well.  Central aortic and left ventricular pressures were measured with pigtail.   Ventriculography was performed in the RAO projection.  On top of this, we then tried for short period to get access to the femoral vein, but the patient had pretty considerable pain with attempts at access and so we elected not to continue.  She was taken to the holding area in satisfactory clinical condition after sheath removal.  HEMODYNAMIC DATA: 1. Central weight pressure is 144/81, mean 107. 2. LV pressure 159/22. 3. No gradient or pullback across the aortic valve.  ANGIOGRAPHIC DATA: 1. The left main is free of critical disease. 2. The left anterior descending artery has a previously placed stent     in its midportion.  There is probably about 30-40% diffuse     narrowing throughout this segment.  There is a diagonal that comes     out in the midportion of the stent and it has about 40-50%     narrowing and then 50-70% narrowing in a very small subbranch.  The     diagonal is diffusely irregular.  The middistal vessel courses to     the apex where it wraps the apical tip. 3. There is a tiny ramus branch which is diffusely plaqued, but     without critical narrowing. 4. There is a fairly  large ramus that has competitive filling.  There     is probably 30-40% narrowing noted proximal to the graft insertion     after.  The graft insertion, there is about 50% narrowing prior to     the distal bifurcation.  There is competitive filling throughout     this area noted. 5. The circumflex coronary artery native vessel has about 40%     narrowing and then a subtotal occlusion prior to the graft     insertion.  There is retrograde filling of this graft. 6. The right coronary artery has about 50% eccentric narrowing     proximally and 50% in the posterolateral branch. 7. The saphenous vein graft sequentially to the intermediate and     distal circumflex is widely patent with good flow. 8. The vein graft to the distal PDA is widely patent with about 40%     narrowing proximally and then  diffuse 40% narrowing in the     midportion. 9. The internal mammary to the distal LAD is atretic at this point in     time. 10.Ventriculography in the RAO projection reveals preserved global     systolic function with vigorous ejection fraction.  CONCLUSION: 1. Normal left ventricular systolic function. 2. Continued patency of the previously placed stent with moderate     diffuse narrowing and an atretic internal mammary to the distal     vessel. 3. Continued patency of the saphenous vein graft sequentially to the     intermediate and distal circumflex. 4. Fairly patent native flow to the distal right coronary artery with     also continued patency of the saphenous vein graft.  DISPOSITION:  At the present time, her coronary anatomy appears to be intact.  Fortunately, she has stopped smoking.  She does have obstructive sleep apnea.  CT exam suggests centrilobular emphysema.  Her echocardiogram does not clearly demonstrate increase in transvalvular velocity on the right.  We may consider a right heart catheterization, but likely would consider an alternative verapamil for this in part because of the difficulty gaining access to the femoral vasculature.     Arturo Morton. Riley Kill, MD, [facility]     TDS/MEDQ  D:  09/27/2010  T:  09/27/2010  Job:  [id number]  cc:   Joni Fears D. Maple Hudson, MD, Tonny Bollman, FACP CV Laboratory  Electronically Signed by Shawnie Pons MD [facility] on 10/04/2010 04:44:07 AM

## 2010-10-09 ENCOUNTER — Telehealth: Payer: Self-pay | Admitting: Cardiology

## 2010-10-09 NOTE — Assessment & Plan Note (Signed)
Summary: 4 MTH ROV // RS   Vital Signs:  Patient profile:   55 year old female Menstrual status:  postmenopausal Weight:      235 pounds O2 Sat:      97 % on Room air Pulse rate:   78 / minute BP sitting:   130 / 80  (left arm) Cuff size:   large  Vitals Entered By: Romualdo Bolk, CMA (AAMA) (June 14, 2010 10:34 AM)  O2 Flow:  Room air CC: follow-up visit, Hypertension Management- Pt wants to discuss going on something less expensive than Crestor.   History of Present Illness: [name] comes in today  for  return office visit of multiple medical problems . Since last visit she has been evaluated for increaseingDOE and less exercise tolerance without new chest pain  fever or cough. Saw Dr young and he did echo and chest ct   copd and ? pah  grade 2 diastolic dysfunction. Felt  from part lund issues but given lasix to use as needed.  Had rle edema   off an on   .  has used med over the last wee.  ? if any change in breathing.    had had to use lasix recently to get fluid off .  Has also   had leg cramps more than ususual.     Chest wall pain is porblematic again . dodoene causes icthing and voltaren gel some help in the past  Mood : as good as possible still no insurance and working fulltime   at [employer]. LIPIDS:  ran out of crestor a month ago  andjsut cant afford this. OSA: on cpap   Hypertension History:      She complains of headache, dyspnea with exertion, orthopnea, peripheral edema, visual symptoms, and neurologic problems, but denies chest pain, palpitations, PND, syncope, and side effects from treatment.  She notes no problems with any antihypertensive medication side effects.        Positive major cardiovascular risk factors include hyperlipidemia and hypertension.  Negative major cardiovascular risk factors include female age less than 71 years old and non-tobacco-user status.        Positive history for target organ damage include ASHD (either angina/prior  MI/prior CABG).     Preventive Screening-Counseling & Management  Alcohol-Tobacco     Alcohol drinks/day: <1     Alcohol type: wine     Smoking Status: quit > 6 months     Year Quit: 2006     Tobacco Counseling: to remain off tobacco products  Caffeine-Diet-Exercise     Caffeine use/day: 1-2     Does Patient Exercise: yes     Type of exercise: walking on campus  Current Medications (verified): 1)  Metoprolol Tartrate 25 Mg Tabs (Metoprolol Tartrate) .... 1/2 By Mouth Two Times A Day 2)  Fluoxetine Hcl 20 Mg Caps (Fluoxetine Hcl) .... Take 1 Tablet By Mouth Two Times A Day 3)  Adult Aspirin Ec Low Strength 81 Mg  Tbec (Aspirin) .... .qd 4)  Prilosec Otc 20 Mg  Tbec (Omeprazole Magnesium) 5)  Klor-Con M10 10 Meq Cr-Tabs (Potassium Chloride Crys Cr) .[name].. 1 By Mouth Three Times A Day 6)  Fluocinonide 0.05 % Oint (Fluocinonide) .... Apply To Hands     Two Times A Day For Hand Dermatitis 7)  Cpap 14 8)  Replacement Cpap Mask of Choice and Supplies 9)  Nuvigil 150 Mg Tabs (Armodafinil) .[name].. 1 Daily If Needed 10)  Temazepam 15  Mg Caps (Temazepam) .[name].. 1-2 For Sleep If Needed 11)  Furosemide 20 Mg Tabs (Furosemide) .[name].. 1 Daily 12)  Levothroid 137 Mcg Tabs (Levothyroxine Sodium) .[name].. 1 By Mouth Once Daily 13)  Crestor 20 Mg Tabs (Rosuvastatin Calcium) .... 1/2 By Mouth Once Daily or As Directed 14)  Cpap Mask of Choice and Supplies  Allergies (verified): 1)  ! Penicillin 2)  ! Sulfa 3)  Amoxicillin (Amoxicillin) 4)  Hydrocodone  Past History:  Past medical, surgical, family and social histories (including risk factors) reviewed, and no changes noted (except as noted below).  Past Medical History: Reviewed history from 07/13/2009 and no changes required. 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 Riley Kill Dr. Everardo All  Past Surgical History: Reviewed history from 08/17/2008 and no changes required. Coronary artery bypass graft Debriment for infection in chest  Chest reconstruction Bilateral knee surgery  Bladder surgery Lf Mastoidectomy Hernia repair  Past History:  Care Management: Pulmonary: [facility]  Family History: Reviewed history from 07/13/2009 and no changes required. Family History of CAD Female 1st degree relative <60 Family History High cholesterol Family History Hypertension Family History of Arthritis RA mom Son  Substance abuse  Mom Dm and recently weight loss       Social History: Reviewed history from 07/13/2009 and no changes required. Divorced Former Smoker still tobacco free Working [employer] no med insurance  Alcohol use-no   HH of 3 pet dog.  and MOM.  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  The patient denies anorexia, fever, weight loss, weight gain, vision loss, decreased hearing, syncope, prolonged cough, abdominal pain, melena, hematochezia, muscle weakness, transient blindness, abnormal bleeding, enlarged lymph nodes, and angioedema.    Physical Exam  General:  Well-developed,well-nourished,in no acute distress; alert,appropriate and cooperative throughout examination   Head:  normocephalic and atraumatic.   Eyes:  vision grossly intact,  pupils equal, and pupils round.   Ears:  R ear normal and L ear normal.   Nose:  no external deformity and no external erythema.   Mouth:  pharynx pink and moist.   Neck:  No deformities, masses, or tenderness noted. n jvd  Chest Wall:  well healed scar midline Lungs:  Normal respiratory effort, chest expands symmetrically. Lungs are clear to auscultation, no crackles or wheezes.no dullness.   Heart:  Normal rate and regular rhythm. S1 and S2 normal without gallop, murmur, click, rub or other extra sounds. Abdomen:  Bowel sounds positive,abdomen soft and non-tender without masses, organomegaly or   noted. Pulses:  pulses intact without delay   Extremities:  trace right pedal edema.   no left edema Neurologic:  alert & oriented X3, strength normal in all extremities, gait normal, and DTRs symmetrical and normal.   no clonus  or fasciculations Skin:  turgor normal, color normal, no ecchymoses, and no petechiae.   Cervical Nodes:  No lymphadenopathy noted Psych:  Oriented X3, memory intact for recent and remote, normally interactive, good eye contact, and not anxious appearing.  tearul at times when discussing situation and appropriate for situation   Impression & Recommendations:  Problem # 1:  HYPERTENSION (ICD-401.9) controlled  Her updated medication list for this problem includes:    Metoprolol Tartrate 25 Mg Tabs (Metoprolol tartrate) .[name]... 1/2 by mouth two times a day    Furosemide 20 Mg Tabs (Furosemide) .[name]... 1 daily  Orders: Specimen Handling (16109) Venipuncture (60454) TLB-BMP (Basic Metabolic Panel-BMET) (80048-METABOL)  Her updated medication list for this problem includes:    Metoprolol Tartrate 25 Mg Tabs (Metoprolol tartrate) .[name]... 1/2 by mouth two times a day    Furosemide 20 Mg Tabs (Furosemide) .[name]... 1 daily  Problem # 2:  DYSPNEA (ICD-786.05) DOE   poss combo cv and pilm  this is more obver the last 3 months    Problem # 3:  MUSCLE CRAMPS (ICD-729.82) r/o  metabolic  potass and mg   she doesnt think this is from the statin and off this recently anyway  Orders: Specimen Handling (09811) Venipuncture (91478) TLB-BMP (Basic Metabolic Panel-BMET) (80048-METABOL) TLB-CBC Platelet - w/Differential (85025-CBCD) TLB-Magnesium (Mg) (83735-MG) TLB-CK Total Only(Creatine Kinase/CPK) (82550-CK)  Problem # 4:  CHEST WALL PAIN, ANTERIOR (ICD-786.52) more bothersome recently   woudl avoid even topical nsaid at this time with hx of doe and edema    disc poss serotonoin syndrome risk with tramadol but can use cautiously at night Her updated medication list for this problem includes:    Adult Aspirin Ec Low Strength 81 Mg Tbec (Aspirin) ..... Marland Kitchenqd  Problem # 5:  CORONARY ARTERY DISEASE (ICD-414.00) Assessment: Comment Only  Her updated medication list for this problem includes:    Metoprolol Tartrate 25 Mg Tabs (Metoprolol tartrate) .[name]... 1/2 by mouth two times a day    Adult Aspirin Ec Low Strength 81 Mg Tbec (Aspirin) ..... Marland Kitchenqd    Furosemide 20 Mg Tabs (Furosemide) .[name]... 1 daily  Problem # 6:  HYPERLIPIDEMIA (ICD-272.4) rand out of med for  cost reasons samples of 10 mg    will give samples as much as possible and  continue to check into onfoing programs . can consider other statins but   for now  stay on crestor .  lipitor generic may be as  expensive .   and others may not be as effective.   Her updated medication list for this problem includes:    Crestor 20 Mg Tabs (Rosuvastatin calcium) .[name]... 1/2 by mouth once daily or as directed  Orders: Specimen Handling (29562) Venipuncture (13086)  Problem # 7:  INADEQUATE MATERIAL RESOURCES (ICD-V60.2) no insurance despite fulltime for  years and college.      interfereing in care  and evaluation.     Complete Medication List: 1)  Metoprolol Tartrate 25 Mg Tabs (Metoprolol tartrate) .... 1/2 by mouth two times a day 2)  Fluoxetine Hcl 20 Mg Caps (Fluoxetine hcl) .... Take 1 tablet by mouth two times a  day 3)  Adult Aspirin Ec Low Strength 81 Mg Tbec (Aspirin) .... .qd 4)  Prilosec Otc 20 Mg Tbec (Omeprazole magnesium) 5)  Klor-con M10 10 Meq Cr-tabs (Potassium chloride crys cr) .[name].. 1 by mouth three times a day 6)  Fluocinonide 0.05 % Oint (Fluocinonide) .... Apply to hands     two times a day for hand dermatitis 7)  Cpap 14  8)  Replacement Cpap Mask of Choice and Supplies  9)  Nuvigil  150 Mg Tabs (Armodafinil) .[name].. 1 daily if needed 10)  Temazepam 15 Mg Caps (Temazepam) .[name].. 1-2 for sleep if needed 11)  Furosemide 20 Mg Tabs (Furosemide) .[name].. 1 daily 12)  Levothroid 137 Mcg Tabs (Levothyroxine sodium) .[name].. 1 by mouth once daily 13)  Crestor 20 Mg Tabs (Rosuvastatin calcium) .... 1/2 by mouth once daily or as directed 14)  Cpap Mask of Choice and Supplies  15)  Tramadol Hcl 50 Mg Tabs (Tramadol hcl) .[name].. 1 by mouth three times a day  as neede for chest wall pain  Other Orders: Admin 1st Vaccine (09811) Flu Vaccine 30yrs + (91478) TLB-TSH (Thyroid Stimulating Hormone) (84443-TSH) TLB-A1C / Hgb A1C (Glycohemoglobin) (83036-A1C)  Hypertension Assessment/Plan:      The patient's hypertensive risk group is category C: Target organ damage and/or diabetes.  Today's blood pressure is 130/80.  Her blood pressure goal is < 140/90.  Patient Instructions: 1)  You will be informed of lab results when available.  2)  I want you to see Dr Riley Kill about your shortness of breath. 3)  try tramadol   for the chest wall pain if needed at night . 4)  Care with this as  can get too much serotonin with the prozac  5)  stop if too jittery . Prescriptions: TRAMADOL HCL 50 MG TABS (TRAMADOL HCL) 1 by mouth three times a day  as neede for chest wall pain  #40 x 0   Entered and Authorized by:   Madelin Headings MD   Signed by:   Madelin Headings MD on 06/14/2010   Method used:   Print then Give to Patient   RxID:   [id number]     Flu Vaccine Consent Questions     Do you have a history of severe  allergic reactions to this vaccine? no    Any prior history of allergic reactions to egg and/or gelatin? no    Do you have a sensitivity to the preservative Thimersol? no    Do you have a past history of Guillan-Barre Syndrome? no    Do you currently have an acute febrile illness? no    Have you ever had a severe reaction to latex? no    Vaccine information given and explained to patient? yes    Are you currently pregnant? no    Lot Number:[id number]   Exp Date:03/09/2011   Site Given  Left Deltoid IMflu1 Romualdo Bolk, CMA (AAMA)  June 14, 2010 10:38 AM

## 2010-10-09 NOTE — Progress Notes (Signed)
   Walk in Patient Form Recieved."Pt needs RX Refill" forwarded to Message Nurse [facility]  December 20, 2009 1:45 PM

## 2010-10-09 NOTE — Assessment & Plan Note (Signed)
Summary: 42M RECK/KLW   Primary Provider/Referring Provider:  Madelin Headings MD  CC:  4 month recheck-cough-? alleriges x 1 month; no help from Symbicort.[name]  History of Present Illness: August 22, 2009  OSA, Dyspnea Returns for f/u and lab review. Dyspneic with 4 minute walk from parking to school, but no real pattern.  Keeps a nonproductive cough with substernal soreness residual after her CABG. Denies smoking now. Did get Flu vax, with pneumovax in 2007. Denies reflux and takes an occasional prilosec. CPAP- still compliannt and helpful PFT-Mild obstruction, small airways, normal lung volumes. DLCO moderately reduced 50%. - 100, 98, 97%, 397  CBC- Nl hgb BNP- 162 CXR-Mild pulm vasc cong  December 20, 2009- OSA, dyspnea, Hx CABG Progressive dyspnea, mainly exertional, waliking one end of home to another. Only sitting relieves it.Marland KitchenMarland KitchenGetting slowly worse since last here.. Denies chest pain, palpitation. swelling. Cough nonproductive, wakes her. Right upper leg ache come and goes. No nocturia. Could not see benefit from Symbicort or Proair.        Current Medications (verified): 1)  Levothroid 125 Mcg Tabs (Levothyroxine Sodium) .... Take 1 Tablet By Mouth Once A Day 2)  Metoprolol Tartrate 25 Mg Tabs (Metoprolol Tartrate) .... 1/2 By Mouth Two Times A Day 3)  Fluoxetine Hcl 20 Mg Caps (Fluoxetine Hcl) .... Take 1 Tablet By Mouth Two Times A Day 4)  Proair Hfa 108 (90 Base) Mcg/act Aers (Albuterol Sulfate) .... 2 Puffs Qid As Needed 5)  Zocor 80 Mg  Tabs (Simvastatin) .[name].. 1 By Mouth Once Daily Stuckey 6)  Adult Aspirin Ec Low Strength 81 Mg  Tbec (Aspirin) .... .qd 7)  Prilosec Otc 20 Mg  Tbec (Omeprazole Magnesium) 8)  Cyclobenzaprine Hcl 10 Mg Tabs (Cyclobenzaprine Hcl) .[name].. 1 By Mouth Hs or As Directed 9)  Klor-Con M10 10 Meq Cr-Tabs (Potassium Chloride Crys Cr) .[name].. 1 By Mouth Three Times A Day 10)  Fluocinonide 0.05 % Oint (Fluocinonide) .... Apply To Hands     Two Times A  Day For Hand Dermatitis 11)  Cpap 14 12)  Replacement Cpap Mask of Choice and Supplies 13)  Nuvigil 150 Mg Tabs (Armodafinil) .[name].. 1 Daily If Needed 14)  Temazepam 15 Mg Caps (Temazepam) .[name].. 1-2 For Sleep If Needed  Allergies (verified): 1)  ! Penicillin 2)  ! 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 Riley Kill Dr. 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  and MOM.  Mom increased med needs and  her health declining School- psychology  [employer]   grad in May. 2011     Risk Factors: Alcohol Use: <1 (07/13/2009) Caffeine Use: 1-2 (07/13/2009) Exercise: yes (07/13/2009)  Risk  Factors: Smoking Status: quit (07/13/2009)  Review of Systems      See HPI       The patient complains of dyspnea on exertion and prolonged cough.  The patient denies anorexia, fever, weight loss, weight gain, vision loss, decreased hearing, hoarseness, chest pain, syncope, peripheral edema, headaches, hemoptysis, abdominal pain, and severe indigestion/heartburn.    Vital Signs:  Patient profile:   55 year old female Menstrual status:  postmenopausal Height:      64 inches Weight:      238.25 pounds BMI:     41.04 O2 Sat:      97 % on Room air Pulse rate:   68 / minute BP sitting:   134 / 86  (right arm) Cuff size:   large  Vitals Entered By: Reynaldo Minium CMA (December 20, 2009 9:07 AM)  O2 Flow:  Room air  Physical Exam  Additional Exam:  General: A/Ox3; pleasant and cooperative, NAD, overweight SKIN: no rash, lesions NODES: no lymphadenopathy HEENT: [place]/AT, EOM- WNL, Conjuctivae- clear, PERRLA, TM-WNL, Nose- clear, Throat- clear and wnl, Melampatti III NECK: Supple w/ fair ROM, JVD- none, normal carotid impulses w/o bruits Thyroid- normal to palpation CHEST: Clear to P&A, sternotomy scar, rough occasional cough, unlabored HEART: RRR, no m/g/r heard ABDOMEN: Soft  DGU:YQIH, nl pulses, no edema , negative Homan's/ some discomfort behind left knee NEURO: Grossly intact to observation        Impression & Recommendations:  Problem # 1:  DYSPNEA (ICD-786.05)  Hx of CABG, without angina or elevated BNP last visit. Not helped by bronchodilators. Question PE, ischemic CM or pulmonary hypertension, although her room air resting sat here is 97%. She may respond to a diuretic, which she will take after her Echo is done. Will order D-dimer and Spiral CT to exclude pulmonary embolism.  Problem # 2:  OBSTRUCTIVE SLEEP APNEA (ICD-327.23)  Still fully compliant and helped by CPAP. With recent power failure she couldn't sleep due to her snoring.  Medications Added to Medication List  This Visit: 1)  Furosemide 20 Mg Tabs (Furosemide) .[name].. 1 daily  Other Orders: Est. Patient Level III (47425) TLB-BMP (Basic Metabolic Panel-BMET) (80048-METABOL) T-D-Dimer Fibrin Derivatives Quantitive (95638-75643) Radiology Referral (Radiology) Echo Referral (Echo)  Patient Instructions: 1)  Please schedule a follow-up appointment in 1 month. 2)  A Spiral CT of the Chest has been recommended.  Your imaging study may require preauthorization.  3)  Lab 4)  A 2D Echocardiogram has been recommended.  Your imaging study may require preauthorization. 5)  Script for lasix 20 mg. After ECHO is done, begin taking one daily alont with 1 extra KlorCon daily, x 7 days. Then one of each daily as needed. Prescriptions: FUROSEMIDE 20 MG TABS (FUROSEMIDE) 1 daily  #30 x 3   Entered and Authorized by:   Waymon Budge MD   Signed by:   Waymon Budge MD on 12/20/2009   Method used:   Print then Give to Patient   RxID:   [id number]

## 2010-10-09 NOTE — Progress Notes (Signed)
Summary: Question about SBE   Phone Note Call from Patient Call back at Home Phone [phone] Call back at [phone]   Reason for Call: Talk to Nurse Details for Reason: Need antibiotic  Summary of Call: Having dental work next week.  Need antibiotic call to   [facility] on [address] [place] [phone] Initial call taken by: Omar Person,  July 13, 2010 10:31 AM  Follow-up for Phone Call        The pt was last seen by Dr Riley Kill 05/14/08.  The pt called because she wanted to know if she needs antibiotics prior to dental appt.  I told the pt that she does not require SBE.  Follow-up by: Julieta Gutting, RN, BSN,  July 13, 2010 11:37 AM

## 2010-10-09 NOTE — Progress Notes (Signed)
Summary: rtc  Phone Note Call from Patient Call back at [phone]   Caller: Patient Call For: Madelin Headings MD Summary of Call: pt is returning shannon call Initial call taken by: Heron Sabins,  June 15, 2010 2:58 PM  Follow-up for Phone Call        Pt aware of results. Follow-up by: Romualdo Bolk, CMA (AAMA),  June 15, 2010 3:07 PM

## 2010-10-09 NOTE — Assessment & Plan Note (Signed)
Summary: ROV 1 MONTH ///KP   Primary Provider/Referring Provider:  Madelin Headings MD  CC:  1 month follow up visit-Dyspnea and OSA; doing good.[name]  History of Present Illness: History of Present Illness: August 22, 2009  OSA, Dyspnea Returns for f/u and lab review. Dyspneic with 4 minute walk from parking to school, but no real pattern.  Keeps a nonproductive cough with substernal soreness residual after her CABG. Denies smoking now. Did get Flu vax, with pneumovax in 2007. Denies reflux and takes an occasional prilosec. CPAP- still compliannt and helpful PFT-Mild obstruction, small airways, normal lung volumes. DLCO moderately reduced 50%. - 100, 98, 97%, 397  CBC- Nl hgb BNP- 162 CXR-Mild pulm vasc cong  December 20, 2009- OSA, dyspnea, Hx CABG Progressive dyspnea, mainly exertional, waliking one end of home to another. Only sitting relieves it.Marland KitchenMarland KitchenGetting slowly worse since last here.. Denies chest pain, palpitation. swelling. Cough nonproductive, wakes her. Right upper leg ache come and goes. No nocturia. Could not see benefit from Symbicort or Proair.   Jan 19, 2010- OSA, Dyspnea, Hx CABG Still some days with worse dyspnea- no pattern, but overall dyspnea is better than last visit. A little dry cough, no wheeze. Feet may swell a little. Denies chest pain or palpitation. Continues well with CPAP. She uses Nuvigil, 150 mg,  5 days/ week, napping on weekends.  Reviewed tests-    Current Medications (verified): 1)  Levothroid 125 Mcg Tabs (Levothyroxine Sodium) .... Take 1 Tablet By Mouth Once A Day 2)  Metoprolol Tartrate 25 Mg Tabs (Metoprolol Tartrate) .... 1/2 By Mouth Two Times A Day 3)  Fluoxetine Hcl 20 Mg Caps (Fluoxetine Hcl) .... Take 1 Tablet By Mouth Two Times A Day 4)  Proair Hfa 108 (90 Base) Mcg/act Aers (Albuterol Sulfate) .... 2 Puffs Qid As Needed 5)  Zocor 80 Mg  Tabs (Simvastatin) .[name].. 1 By Mouth Once Daily Stuckey 6)  Adult Aspirin Ec Low Strength 81 Mg   Tbec (Aspirin) .... .qd 7)  Prilosec Otc 20 Mg  Tbec (Omeprazole Magnesium) 8)  Cyclobenzaprine Hcl 10 Mg Tabs (Cyclobenzaprine Hcl) .[name].. 1 By Mouth Hs or As Directed 9)  Klor-Con M10 10 Meq Cr-Tabs (Potassium Chloride Crys Cr) .[name].. 1 By Mouth Three Times A Day 10)  Fluocinonide 0.05 % Oint (Fluocinonide) .... Apply To Hands     Two Times A Day For Hand Dermatitis 11)  Cpap 14 12)  Replacement Cpap Mask of Choice and Supplies 13)  Nuvigil 150 Mg Tabs (Armodafinil) .[name].. 1 Daily If Needed 14)  Temazepam 15 Mg Caps (Temazepam) .[name].. 1-2 For Sleep If Needed 15)  Furosemide 20 Mg Tabs (Furosemide) .[name].. 1 Daily  Allergies (verified): 1)  ! Penicillin 2)  ! 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 Riley Kill Dr. Everardo All  Past Surgical History: Last updated: 08/17/2008 Coronary artery bypass graft Debriment for infection in chest  Chest reconstruction Bilateral knee surgery  Bladder surgery Lf Mastoidectomy Hernia repair  Family History: Last updated: 07/13/2009  Family History of CAD Female 1st degree relative <60 Family History High cholesterol Family History Hypertension Family History of Arthritis RA mom Son  Substance abuse  Mom Dm and recently  weight loss       Social History: Last updated: 07/13/2009 Divorced Former Smoker still tobacco free Working [employer] Alcohol use-no   HH of 3 pet dog.  and MOM.  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  The patient denies shortness of breath at rest, productive cough, coughing up blood, chest pain, irregular heartbeats, acid heartburn, indigestion, loss of appetite, weight change, abdominal pain, difficulty swallowing, sore throat, tooth/dental problems, headaches, nasal congestion/difficulty breathing through nose, and sneezing.    Vital Signs:  Patient profile:   55 year old female Menstrual status:  postmenopausal Height:      64 inches Weight:      238 pounds BMI:     41.00 O2 Sat:      96 % on Room air Pulse rate:   68 / minute BP sitting:   124 / 68  (left arm) Cuff size:   large  Vitals Entered By: Reynaldo Minium CMA (Jan 19, 2010 10:03 AM)  O2 Flow:  Room air  Physical Exam  Additional Exam:  General: A/Ox3; pleasant and cooperative, NAD, overweight                  Room air sat 96% SKIN: no rash, lesions NODES: no lymphadenopathy HEENT: [place]/AT, EOM- WNL, Conjuctivae- clear, PERRLA, TM-WNL, Nose- clear, Throat- clear and wnl, Mallampati III NECK: Supple w/ fair ROM, JVD- none, normal carotid impulses w/o bruits Thyroid- normal to palpation CHEST: Clear to P&A, sternotomy scar, no cough or wheeze, unlabored HEART: RRR, no m/g/r heard ABDOMEN: Soft  ZOX:WRUE, nl pulses, no edema , no edema, clubbing or cyanosis NEURO: Grossly intact to observation        Impression & Recommendations:  Problem # 1:  DYSPNEA  (ICD-786.05)  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 Her PFT showed mild obstruction. Dyspnea will be a multifactorial result of cardiopulmonary issues. I suggested best intervention for now would be a regular walking program.   Problem # 2:  OBSTRUCTIVE SLEEP APNEA (ICD-327.23)  Good compliance and control with her CPAP. She doesnt think Nuvigil works quite as well as it did. We will give sample of Nuvigil 250. She is going back to gym today.  Other Orders: Est. Patient Level III (45409)  Patient Instructions: 1)  Please schedule a follow-up appointment in 6 months. 2)  regular cardio/ aerobic exercise is likely to be good for you. 3)  Try sample Nuvigil 250, 1 daily as needed.

## 2010-10-09 NOTE — Progress Notes (Signed)
Summary: Still having leg cramps  Phone Note Call from Patient Call back at [phone] (cell)   Caller: Patient Summary of Call: Pt aware of results and still has the leg cramps. She states that there is no difference in them. Initial call taken by: Romualdo Bolk, CMA Duncan Dull),  July 04, 2010 1:39 PM  Follow-up for Phone Call        i am not sure why she is having leg cramps.   However she still mayneed to stay on a MG supplement if mg level keeps dropping.    Repeat  Mg  and  calcium level in 3 -4 weeks  .  Dx  low mg  leg cramps. Follow-up by: Madelin Headings MD,  July 18, 2010 9:37 AM  Additional Follow-up for Phone Call Additional follow up Details #1::        Pt. notified. Additional Follow-up by: Lynann Beaver CMA AAMA,  July 18, 2010 11:13 AM

## 2010-10-09 NOTE — Medication Information (Signed)
Summary: [facility] [facility]  [employer]   Imported By: Lester [place] 03/28/2010 10:58:35  _____________________________________________________________________  External Attachment:    Type:   Image     Comment:   External Document

## 2010-10-09 NOTE — Medication Information (Signed)
Summary: Assist form for Nuvigil/[facility] [facility]  Assist form for Nuvigil/[facility] [facility]   Imported By: Sherian Rein 03/08/2010 07:48:42  _____________________________________________________________________  External Attachment:    Type:   Image     Comment:   External Document

## 2010-10-09 NOTE — Assessment & Plan Note (Signed)
Summary: 1 year return/mhh   Primary Provider/Referring Provider:  Madelin Headings MD  CC:  Follow up visit-sleep; needs new mask and refill for temazepam and proair hfa..  History of Present Illness: December 20, 2009- OSA, dyspnea, Hx CABG Progressive dyspnea, mainly exertional, [place] home [place]. Only sitting relieves it.Marland KitchenMarland KitchenGetting slowly worse since last here.. Denies chest pain, palpitation. swelling. Cough nonproductive, wakes her. Right upper leg ache come and goes. No nocturia. Could not see benefit from Symbicort or Proair.   Jan 19, 2010- OSA, Dyspnea, Hx CABG Still some days with worse dyspnea- no pattern, but overall dyspnea is better than last visit. A little dry cough, no wheeze. Feet may swell a little. Denies chest pain or palpitation. Continues well with CPAP. She uses Nuvigil, 150 mg,  5 days/ week, napping on weekends.  Reviewed tests-  March 02, 2010- OSA, Dyspnea/COPD, Hx CABG Again reviewed CT from April re her dyspnea- no PE but emphysema and ? PHTN. Brings assistance form for Nuvigil. it makes her life better. Fell asleep at work one day last week when she forgot it. She often skips it on weekends, wouldn't go on long drive without it. Well tolerated.  needs new CPAP mask- uses all night every night. Also asks renewal of temazepam. Some weight gain. Years ago told she had sarcoidosis- never mentioned since, never bx'd. She doesn't see need for ACE level now. Still aware of dyspnea as "sizeable  problem" but it was gone 3-4 weeks, then came back. Denies wheeze or cough. Reviewed prior PFT- mild obstruction, moderate reduction of DLCO attributed to emphysema, obesity and her cardiac status. Has been out of rescue inhaler for awhile but admits she would use it if she had it.     Asthma History    Initial Asthma Severity Rating:    Age range: 12+ years    Symptoms: 0-2 days/week    Nighttime Awakenings: 0-2/month    Interferes w/ normal activity: no  limitations    SABA use (not for EIB): 0-2 days/week    Asthma Severity Assessment: Intermittent   Preventive Screening-Counseling & Management  Alcohol-Tobacco     Smoking Status: quit > 6 months     Year Quit: 2006     Tobacco Counseling: to remain off tobacco products  Current Medications (verified): 1)  Levothroid 150 Mcg Tabs (Levothyroxine Sodium) .... Take 1 By Mouth Once Daily 2)  Metoprolol Tartrate 25 Mg Tabs (Metoprolol Tartrate) .... 1/2 By Mouth Two Times A Day 3)  Fluoxetine Hcl 20 Mg Caps (Fluoxetine Hcl) .... Take 1 Tablet By Mouth Two Times A Day 4)  Proair Hfa 108 (90 Base) Mcg/act Aers (Albuterol Sulfate) .... 2 Puffs Qid As Needed 5)  Adult Aspirin Ec Low Strength 81 Mg  Tbec (Aspirin) .... .qd 6)  Prilosec Otc 20 Mg  Tbec (Omeprazole Magnesium) 7)  Klor-Con M10 10 Meq Cr-Tabs (Potassium Chloride Crys Cr) .[name].. 1 By Mouth Three Times A Day 8)  Fluocinonide 0.05 % Oint (Fluocinonide) .... Apply To Hands     Two Times A Day For Hand Dermatitis 9)  Cpap 14 10)  Replacement Cpap Mask of Choice and Supplies 11)  Nuvigil 150 Mg Tabs (Armodafinil) .[name].. 1 Daily If Needed 12)  Temazepam 15 Mg Caps (Temazepam) .[name].. 1-2 For Sleep If Needed 13)  Furosemide 20 Mg Tabs (Furosemide) .[name].. 1 Daily 14)  Levothroid 150 Mcg Tabs (Levothyroxine Sodium) .[name].. 1 By Mouth Once Daily 15)  Crestor 20 Mg  Tabs (Rosuvastatin Calcium) .... 1/2 By Mouth Once Daily or As Directed  Allergies (verified): 1)  ! Penicillin 2)  ! 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 Riley Kill Dr. 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  and MOM.  Mom increased med needs and her health declining School- psychology  [employer]   grad in May. 2011     Risk Factors: Alcohol Use: <1 (01/24/2010) Caffeine Use: 1-2 (01/24/2010) Exercise: yes (01/24/2010)  Risk Factors: Smoking Status: quit > 6 months (03/02/2010)  Social History: Smoking Status:  quit > 6 months  Review of Systems      See HPI       The patient complains of shortness of breath with activity.  The patient denies shortness of breath at rest, productive cough, non-productive cough, coughing up blood, chest pain, irregular heartbeats, acid heartburn, indigestion, loss of appetite, weight change, abdominal pain, difficulty swallowing, sore throat, tooth/dental problems, headaches, nasal congestion/difficulty breathing through nose, and sneezing.    Vital Signs:  Patient profile:   55 year old female Menstrual status:  postmenopausal Height:      64 inches Weight:      235 pounds BMI:     40.48 O2 Sat:      95 % on Room  air Pulse rate:   56 / minute BP sitting:   130 / 80  (left arm) Cuff size:   regular  Vitals Entered By: Reynaldo Minium CMA (March 02, 2010 9:07 AM)  O2 Flow:  Room air  Physical Exam  Additional Exam:  General: A/Ox3; pleasant and cooperative, NAD, overweight                  Room air sat 95% SKIN: no rash, lesions NODES: no lymphadenopathy HEENT: [place]/AT, EOM- WNL, Conjuctivae- clear, PERRLA, TM-WNL, Nose- clear, Throat- clear and wnl, Mallampati III NECK: Supple w/ fair ROM, JVD- none, normal carotid impulses w/o bruits Thyroid- normal to palpation CHEST: Clear to P&A, sternotomy scar, no cough or wheeze, unlabored. There is an audible and palpable click to right of midline anterior chest reflecting lack of sternum complicating her prior CABG surgery. HEART: RRR, no m/g/r heard ABDOMEN: Soft, obese ZOX:WRUE, nl pulses, no edema , no edema, clubbing or cyanosis NEURO: Grossly intact to observation        CT of Chest  Procedure date:  4/11  Findings:      CT ANGIO CHEST W/CM &/OR WO/CM - [id number]  Clinical Data:  Short of breath with exertion.  Chronic sternal pain.  Cough.  CABG.  Staph infection of the sternum.  Previous smoker.   CT ANGIOGRAPHY CHEST WITH CONTRAST   Technique:  Multidetector CT imaging of the chest was performed using the standard protocol during bolus administration of intravenous contrast.  Multiplanar CT image reconstructions including MIPs were obtained to evaluate the vascular anatomy.   Contrast:  80 ml Omnipaque 350 IV.   Comparison:  None.   Findings:  COPD/centrilobular emphysema.  Mild dependent atelectasis at the posterior lung bases.  Main pulmonary artery trunk measures 3 cm in diameter.  This is the upper limits of normal.  It may reflect PAH.  Right lower paratracheal and right perihilar nodes are slightly prominent in size.  There is a 1.2 cm low attenuation right hilar node.   Negative for PE.  No acute aortic abnormality.   Median sternotomy with interposed to soft tissue.  No acute sternal or peri sternal abnormality.  No pericardial or pleural effusions.   Review of the MIP images confirms the above findings.   IMPRESSION: Negative for PE.  Centrilobular emphysema. Query PAH.  Negative for pneumonia.   Read By:  Jonne Ply,  M.D.     Released By:  Jonne Ply,  M.D.  ___________  Impression & Recommendations:  Problem # 1:  DYSPNEA (ICD-786.05)  Multifactorial- deconditioning/ obesity, mild/mod COPD, cardiac disease. She feels some help from a rescue inhaler indicating an occasional reversible bronchospastic component. We will give sample and script.  Problem # 2:  OBSTRUCTIVE SLEEP APNEA (ICD-327.23)  Great compliance and control with CPAP, but with residual sleepiness for which Nuvigil has been very helpful.  Problem # 3:  ASTHMA (ICD-493.90) As per her COPD dx- we will give rescue inhaler with reminder about use and indication.  Medications Added to Medication List This Visit: 1)  Levothroid 150 Mcg Tabs (Levothyroxine sodium) .... Take 1 by mouth once daily 2)  Cpap Mask of Choice and Supplies   Other Orders: Est. Patient Level IV ([address])  Patient Instructions: 1)  Please schedule a follow-up appointment in 6 months. 2)  Sample and script Proair rescue inhaler: 2 puffs qid as needed  3)  Renewed Nuvigil and I will fill out the assistance form 4)  Script for replacement CPAP mask  5)  Scrpt for temazepam refill 6)  Continue CPAP at 14 Prescriptions: TEMAZEPAM 15 MG CAPS (TEMAZEPAM) 1-2 for sleep if needed  #30 x prn   Entered and Authorized by:   Waymon Budge MD   Signed by:   Waymon Budge MD on 03/02/2010   Method used:   Print then Give to Patient   RxID:   [id number] NUVIGIL 150 MG TABS (ARMODAFINIL) 1 daily if needed  #90 x 3   Entered and Authorized by:   Waymon Budge MD   Signed by:   Waymon Budge MD on 03/02/2010   Method used:   Print then  Give to Patient   RxID:   [id number] PROAIR HFA 108 (90 BASE) MCG/ACT AERS (ALBUTEROL SULFATE) 2 puffs qid as needed  #1 x 11   Entered and Authorized by:   Waymon Budge MD   Signed by:   Waymon Budge MD on 03/02/2010   Method used:   Print then Give to Patient   RxID:   [id number] CPAP MASK OF CHOICE AND SUPPLIES   #1 x prn   Entered and  Authorized by:   Waymon Budge MD   Signed by:   Waymon Budge MD on 03/02/2010   Method used:   Print then Give to Patient   RxID:   [id number]      CT of Chest  Procedure date:  4/11  Findings:      CT ANGIO CHEST W/CM &/OR WO/CM - [id number]   Clinical Data:  Short of breath with exertion.  Chronic sternal pain.  Cough.  CABG.  Staph infection of the sternum.  Previous smoker.   CT ANGIOGRAPHY CHEST WITH CONTRAST   Technique:  Multidetector CT imaging of the chest was performed using the standard protocol during bolus administration of intravenous contrast.  Multiplanar CT image reconstructions including MIPs were obtained to evaluate the vascular anatomy.   Contrast:  80 ml Omnipaque 350 IV.   Comparison:  None.   Findings:  COPD/centrilobular emphysema.  Mild dependent atelectasis at the posterior lung bases.  Main pulmonary artery trunk measures 3 cm in diameter.  This is the upper limits of normal.  It may reflect PAH.  Right lower paratracheal and right perihilar nodes are slightly prominent in size.  There is a 1.2 cm low attenuation right hilar node.   Negative for PE.  No acute aortic abnormality.  Median sternotomy with interposed to soft tissue.  No acute sternal or peri sternal abnormality.  No pericardial or pleural effusions.   Review of the MIP images confirms the above findings.   IMPRESSION: Negative for PE.  Centrilobular emphysema. Query PAH.  Negative for pneumonia.   Read By:  Jonne Ply,  M.D.     Released By:  Jonne Ply,  M.D.  ___________

## 2010-10-09 NOTE — Assessment & Plan Note (Signed)
Summary: follow up/ssc   Vital Signs:  Patient profile:   55 year old female Menstrual status:  postmenopausal Weight:      232 pounds BP sitting:   130 / 80  (right arm)  Vitals Entered By: Kathrynn Speed CMA (Jan 24, 2010 11:38 AM) CC: FU meds & labs, Hypertension Management   History of Present Illness: [name]  comes in today  for above  follow up of multiple medical problems .    BP:   doing   ok and no seo fo meds  LIPIDS: on simvastatin for cost reasons   had been on lipitor in the past.  THroid no missed med s.  SOB   eval per dr Maple Hudson  and followed .  Mood:   fluoxetine  less in past .  and better  . Chest pain  sternum  about the same worse at times   coping.   Hypertension History:      She complains of headache, dyspnea with exertion, peripheral edema, and neurologic problems, but denies chest pain, palpitations, visual symptoms, and side effects from treatment.  She notes no problems with any antihypertensive medication side effects.  numbness in hands.        Positive major cardiovascular risk factors include hyperlipidemia and hypertension.  Negative major cardiovascular risk factors include female age less than 39 years old and non-tobacco-user status.        Positive history for target organ damage include ASHD (either angina/prior MI/prior CABG).     Preventive Screening-Counseling & Management  Alcohol-Tobacco     Alcohol drinks/day: <1     Alcohol type: wine     Smoking Status: quit     Year Quit: 05/10/2006  Caffeine-Diet-Exercise     Caffeine use/day: 1-2     Does Patient Exercise: yes     Type of exercise: walking on campus  Current Medications (verified): 1)  Levothroid 125 Mcg Tabs (Levothyroxine Sodium) .... Take 1 Tablet By Mouth Once A Day 2)  Metoprolol Tartrate 25 Mg Tabs (Metoprolol Tartrate) .... 1/2 By Mouth Two Times A Day 3)  Fluoxetine Hcl 20 Mg Caps (Fluoxetine Hcl) .... Take 1 Tablet By Mouth Two Times A Day 4)  Proair Hfa 108  (90 Base) Mcg/act Aers (Albuterol Sulfate) .... 2 Puffs Qid As Needed 5)  Zocor 80 Mg  Tabs (Simvastatin) .[name].. 1 By Mouth Once Daily Stuckey 6)  Adult Aspirin Ec Low Strength 81 Mg  Tbec (Aspirin) .... .qd 7)  Prilosec Otc 20 Mg  Tbec (Omeprazole Magnesium) 8)  Cyclobenzaprine Hcl 10 Mg Tabs (Cyclobenzaprine Hcl) .[name].. 1 By Mouth Hs or As Directed 9)  Klor-Con M10 10 Meq Cr-Tabs (Potassium Chloride Crys Cr) .[name].. 1 By Mouth Three Times A Day 10)  Fluocinonide 0.05 % Oint (Fluocinonide) .... Apply To Hands     Two Times A Day For Hand Dermatitis 11)  Cpap 14 12)  Replacement Cpap Mask of Choice and Supplies 13)  Nuvigil 150 Mg Tabs (Armodafinil) .[name].. 1 Daily If Needed 14)  Temazepam 15 Mg Caps (Temazepam) .[name].. 1-2 For Sleep If Needed 15)  Furosemide 20 Mg Tabs (Furosemide) .[name].. 1 Daily  Allergies (verified): 1)  ! Penicillin 2)  ! Sulfa 3)  Amoxicillin (Amoxicillin) 4)  Hydrocodone  Past History:  Past medical, surgical, family and social histories (including risk factors) reviewed, and no changes noted (except as noted below).  Past Medical History: Reviewed history from 07/13/2009 and no changes required. 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 Riley Kill Dr. Everardo All  Past Surgical History: Reviewed history from 08/17/2008 and no changes required. Coronary artery  bypass graft Debriment for infection in chest  Chest reconstruction Bilateral knee surgery  Bladder surgery Lf Mastoidectomy Hernia repair  Family History: Reviewed history from 07/13/2009 and no changes required. Family History of CAD Female 1st degree relative <60 Family History High cholesterol Family History Hypertension Family History of Arthritis RA mom Son  Substance abuse  Mom Dm and recently weight loss       Social History: Reviewed history from 07/13/2009 and no changes required. Divorced Former Smoker still tobacco free Working [employer] Alcohol use-no   HH of 3 Development worker, international aid.  and MOM.  Mom increased med needs and her health declining School- psychology  [employer]   grad in May. 2011     Review of Systems  The patient denies anorexia, fever, weight loss, weight gain, vision loss, decreased hearing, hoarseness, chest pain, syncope, peripheral edema, prolonged cough, hemoptysis, melena, hematochezia, severe indigestion/heartburn, hematuria, muscle weakness, transient blindness, difficulty walking, depression, abnormal bleeding, enlarged lymph nodes, and angioedema.    Physical Exam  General:  Well-developed,well-nourished,in no acute distress; alert,appropriate and cooperative throughout examination Head:  normocephalic and atraumatic.   Eyes:  vision grossly intact, pupils equal, and pupils round.   Ears:  R ear normal and L ear normal.  no external deformities.   Nose:  no external deformity, no external erythema, and no nasal discharge.   Mouth:  pharynx pink and moist.   Chest Wall:  well healed mid line scar no swelling or crepitus Lungs:  Normal respiratory effort, chest expands symmetrically. Lungs are clear to auscultation, no crackles or wheezes.no dullness.   Heart:  Normal rate and regular rhythm. S1 and S2 normal without gallop, murmur, click, rub or other extra sounds.no lifts.   Abdomen:  Bowel sounds positive,abdomen soft and non-tender without masses,  organomegaly or   noted. Extremities:  no clubbing cyanosis or edema  Neurologic:  alert & oriented X3, strength normal in all extremities, and gait normal.   Skin:  turgor normal, color normal, no ecchymoses, and no petechiae.   Cervical Nodes:  No lymphadenopathy noted Psych:  Oriented X3, normally interactive, good eye contact, and not depressed appearing.     Impression & Recommendations:  Problem # 1:  HYPERLIPIDEMIA (ICD-272.4)  Her updated medication list for this problem includes:    Zocor 80 Mg Tabs (Simvastatin) .[name]... 1 by mouth once daily stuckey    Crestor 20 Mg Tabs (Rosuvastatin calcium) .[name]... 1/2 by mouth once daily or as directed  Labs Reviewed: SGOT: 29 (01/15/2010)   SGPT: 32 (01/15/2010)  Lipid Goals: Chol Goal: 200 (03/30/2007)   HDL Goal: 40 (03/30/2007)   LDL Goal: 100 (03/30/2007)   TG Goal: 150 (03/30/2007)  Prior 10 Yr Risk Heart Disease: N/A (03/30/2007)   HDL:47.20 (01/15/2010), 37.3 (10/25/2008)  LDL:75 (10/25/2008), 96 (06/09/2007)  Chol:212 (01/15/2010), 145 (10/25/2008)  Trig:126.0 (01/15/2010), 163 (10/25/2008)  Problem # 2:  MIGRAINE HEADACHE (ICD-346.90)  Her updated medication list for this problem includes:  Metoprolol Tartrate 25 Mg Tabs (Metoprolol tartrate) .[name]... 1/2 by mouth two times a day    Adult Aspirin Ec Low Strength 81 Mg Tbec (Aspirin) ..... Marland Kitchenqd  Problem # 3:  CHEST WALL PAIN, ANTERIOR (ICD-786.52)  Her updated medication list for this problem includes:    Adult Aspirin Ec Low Strength 81 Mg Tbec (Aspirin) ..... 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 1/2 by mouth two times a day    Adult Aspirin Ec Low  Strength 81 Mg Tbec (Aspirin) ..... Marland Kitchenqd    Furosemide 20 Mg Tabs (Furosemide) .[name]... 1 daily  Problem # 7:  HYPOTHYROIDISM (ICD-244.9) no missed doses per patient     ongeneric   at present.     poss adding to  other signs  Her updated medication list for this problem includes:    Levothroid 125 Mcg Tabs (Levothyroxine sodium) .[name]... Take 1 tablet by mouth once a day    Levothroid 150 Mcg Tabs (Levothyroxine sodium) .[name]... 1 by mouth once daily  Labs Reviewed: TSH: 11.16 (01/15/2010)    Chol: 212 (01/15/2010)   HDL: 47.20 (01/15/2010)   LDL: 75 (10/25/2008)   TG: 126.0 (01/15/2010)  Complete Medication List: 1)  Levothroid 125 Mcg Tabs (Levothyroxine sodium) .... Take 1 tablet by mouth once a day 2)  Metoprolol Tartrate 25 Mg Tabs (Metoprolol tartrate) .... 1/2 by mouth two times a day 3)  Fluoxetine Hcl 20 Mg Caps (Fluoxetine hcl) .... Take 1 tablet by mouth two times a day 4)  Proair Hfa 108 (90 Base) Mcg/act Aers (Albuterol sulfate) .... 2 puffs qid as needed 5)  Zocor 80 Mg Tabs (Simvastatin) .[name].. 1 by mouth once daily stuckey 6)  Adult Aspirin Ec Low Strength 81 Mg Tbec (Aspirin) .... .qd 7)  Prilosec Otc 20 Mg Tbec (Omeprazole magnesium) 8)  Cyclobenzaprine Hcl 10 Mg Tabs (Cyclobenzaprine hcl) .[name].. 1 by mouth hs or as directed 9)  Klor-con M10 10 Meq Cr-tabs (Potassium chloride crys cr) .[name].. 1 by mouth three times a day 10)  Fluocinonide 0.05 % Oint (Fluocinonide) .... Apply to hands     two times a day for hand dermatitis 11)  Cpap 14  12)  Replacement Cpap Mask of Choice and Supplies  13)  Nuvigil 150 Mg Tabs (Armodafinil) .[name].. 1 daily if needed 14)  Temazepam 15 Mg Caps (Temazepam) .[name].. 1-2 for sleep if needed 15)  Furosemide 20 Mg Tabs (Furosemide) .[name].. 1 daily 16)  Levothroid 150 Mcg Tabs (Levothyroxine sodium) .[name].. 1 by mouth once daily 17)  Crestor 20 Mg Tabs (Rosuvastatin calcium) .... 1/2 by mouth once daily or as directed  Hypertension Assessment/Plan:      The patient's  hypertensive risk group is category C: Target organ damage and/or diabetes.  Today's blood pressure is 130/80.  Her blood pressure goal is < 140/90.  Patient Instructions: 1)  check into cost  of lipitor or crestor for your lipids . 2)  Increase thyroid medication.  3)  repeat TSh in 2-3 months on new dose  of thyroid med .  4)  take 10 mg of crestor for now   5)  TSH  LIPID LFTS in  2 months   and then  plan follow up.  Prescriptions: KLOR-CON M10 10 MEQ CR-TABS (POTASSIUM CHLORIDE CRYS CR) 1 by mouth three times a day  #270 x 1   Entered by:   Romualdo Bolk, CMA (AAMA)   Authorized by:   Madelin Headings MD   Signed by:   Romualdo Bolk, CMA (AAMA) on 01/24/2010   Method used:   Electronically to        [facility] [address] (retail)       [address]       [place], [place]  [address]       Ph: [phone]       Fax: [phone]   RxID:   [id number] FLUOXETINE HCL 20 MG CAPS (FLUOXETINE HCL) Take 1 tablet by mouth two times a day  #180 x 1   Entered by:   Romualdo Bolk, CMA (AAMA)   Authorized by:   Madelin Headings MD   Signed by:   Romualdo Bolk, CMA (AAMA) on 01/24/2010   Method used:   Electronically to        [facility] [address]* (retail)       [address]       [place], [place]  [address]       Ph: [phone]       Fax: [phone]   RxID:   [id number] METOPROLOL TARTRATE 25 MG TABS (METOPROLOL TARTRATE) 1/2 by mouth two times a day  #90 x 1   Entered by:   Romualdo Bolk, CMA (AAMA)   Authorized by:   Madelin Headings MD   Signed by:   Romualdo Bolk, CMA (AAMA) on 01/24/2010   Method used:   Electronically to        [facility] [address] (retail)       [address]       [place], [place]  [address]       Ph: [phone]       Fax: [phone]   RxID:   [id number] LEVOTHROID 150 MCG TABS (LEVOTHYROXINE SODIUM) 1 by mouth once daily  #90 x 1   Entered and Authorized by:   Madelin Headings MD   Signed by:   Madelin Headings MD on 01/24/2010   Method used:   Electronically to        [facility] # [address]* (retail)       [address]       [place], [place]  [address]       Ph: [phone]       Fax: [phone]   RxID:   [id number]

## 2010-10-11 NOTE — Assessment & Plan Note (Signed)
Summary: [name]   Primary Provider:  Madelin Headings MD   History of Present Illness: I had [name] come in today to have a sit down visit with both her and her sister regarding her findings, and the workup to date.  Her main complaint has been that of progressive shortness of breath over the past year.  I have reviewed her findings in detail  I reveiwed her CT with the radiologists.  They suggested a repeat in that she had some nodular densities.  They agreed with several items:  emphysema noted, upper normal pulm arteries, and normal R heart size.  She has just modest airflow obstruction.  We did her cath, which was quite difficult, and this demonstrated patent grafts, albeit with better LAD than before, and an atretic LIMA graft.    She has gained some weight, but notes that she continues to be short of breath she thinks out of proportion to her findings.    Allergies: 1)  ! Penicillin 2)  ! Sulfa 3)  Amoxicillin (Amoxicillin) 4)  Hydrocodone   Cardiac Cath  Procedure date:  09/27/2010  Findings:      ANGIOGRAPHIC DATA: 1. The left main is free of critical disease. 2. The left anterior descending artery has a previously placed stent     in its midportion.  There is probably about 30-40% diffuse     narrowing throughout this segment.  There is a diagonal that comes     out in the midportion of the stent and it has about 40-50%     narrowing and then 50-70% narrowing in a very small subbranch.  The     diagonal is diffusely irregular.  The middistal vessel courses to     the apex where it wraps the apical tip. 3. There is a tiny ramus branch which is diffusely plaqued, but     without critical narrowing. 4. There is a fairly large ramus that has competitive filling.  There     is probably 30-40% narrowing noted proximal to the graft insertion     after.  The graft insertion, there is about 50% narrowing prior to     the distal bifurcation.  There is competitive filling  throughout     this area noted. 5. The circumflex coronary artery native vessel has about 40%     narrowing and then a subtotal occlusion prior to the graft     insertion.  There is retrograde filling of this graft. 6. The right coronary artery has about 50% eccentric narrowing     proximally and 50% in the posterolateral branch. 7. The saphenous vein graft sequentially to the intermediate and     distal circumflex is widely patent with good flow. 8. The vein graft to the distal PDA is widely patent with about 40%     narrowing proximally and then diffuse 40% narrowing in the     midportion. 9. The internal mammary to the distal LAD is atretic at this point in     time. 10.Ventriculography in the RAO projection reveals preserved global     systolic function with vigorous ejection fraction.   CONCLUSION: 1. Normal left ventricular systolic function. 2. Continued patency of the previously placed stent with moderate     diffuse narrowing and an atretic internal mammary to the distal     vessel. 3. Continued patency of the saphenous vein graft sequentially to the       VQ Scan  Procedure date:  08/10/2010  Findings:      Comparison:  Chest x-ray from today.    Findings: Multiplanar perfusion imaging shows some patchy decreased   perfusion in the upper lobes, but there is no peripheral wedge   shaped perfusion defect in either lung.    Ventilation imaging shows slightly asymmetric wash-in, decreased on   the right, with some subtle air trapping in the upper lobes.    IMPRESSION:   Low probability for pulmonary embolus.    Emphysema.    Read By:  Kennith Center,  M.D.  CXR  Procedure date:  08/10/2010  Findings:       CHEST - 2 VIEW    Comparison: 07/21/2009    Findings: Previous coronary bypass noted.  Stable cardiomegaly with   vascular congestion and interstitial prominence.  Postop clips over   the right upper lobe.  No definite edema, consolidation, pneumonia,    effusion or pneumothorax.  Midline trachea.  Stable exam.    IMPRESSION:   Postop changes.   Cardiomegaly with vascular congestion.  CT Scan  Procedure date:  12/20/2009  Findings:       IMPRESSION:   Negative for PE.  Centrilobular emphysema. Query PAH.  Negative for   pneumonia.    Read By:  Jonne Ply,  M.D.  Echocardiogram  Procedure date:  12/27/2009  Findings:      Study Conclusions    - Left ventricle: The cavity size was normal. Wall thickness was     increased in a pattern of mild LVH. There was mild focal basal     hypertrophy of the septum. Systolic function was normal. The     estimated ejection fraction was in the range of 60% to 65%.     Regional wall motion abnormalities cannot be excluded. Doppler     parameters are consistent with abnormal left ventricular     relaxation (grade 1 diastolic dysfunction).   - Mitral valve: Calcified annulus.   - Left atrium: The atrium was mildly dilated.   - Right ventricle: The cavity size was mildly dilated.   - Right atrium: The atrium was mildly dilated.   Impressions:  Impression & Recommendations:  Problem # 1:  DYSPNEA (ICD-786.05) I have reviewed all of her studies.  Notably, the RH does not look really large by echo, and there is not a definite increase in RVSP.  ALthough this would be difficult to  eclude.  VQ is neg, CT consistent with emphysema, and cath findings are as noted.  Echo reviewers did not feel RV wqas significantly enlarge, and evidence for PAH is not very high.  We talked about repeat CT based on the findings, as suggested by radiology.  we may require one more fomrla meeting.   Her updated medication list for this problem includes:    Metoprolol Tartrate 25 Mg Tabs (Metoprolol tartrate) .[name]... 1/2 by mouth two times a day    Adult Aspirin Ec Low Strength 81 Mg Tbec (Aspirin) ..... Marland Kitchenqd  Orders: CT Scan  (CT Scan)  Problem # 2:  CAD, ARTERY BYPASS GRAFT (ICD-414.04) extensvely  documented.  Her updated medication list for this problem includes:    Metoprolol Tartrate 25 Mg Tabs (Metoprolol tartrate) .[name]... 1/2 by mouth two times a day    Adult Aspirin Ec Low Strength 81 Mg Tbec (Aspirin) ..... Marland Kitchenqd  Orders: CT Scan  (CT Scan)

## 2010-10-11 NOTE — Cardiovascular Report (Signed)
Summary: Pre Cath Orders   Pre Cath Orders   Imported By: Roderic Ovens 09/25/2010 14:36:59  _____________________________________________________________________  External Attachment:    Type:   Image     Comment:   External Document

## 2010-10-11 NOTE — Assessment & Plan Note (Signed)
Summary: salivary stone   Vital Signs:  Patient profile:   55 year old female Menstrual status:  postmenopausal Height:      64 inches Weight:      233 pounds BMI:     40.14 O2 Sat:      97 % on Room air Temp:     98.9 degrees F oral Pulse rate:   64 / minute BP sitting:   142 / 82  (left arm) Cuff size:   large  Vitals Entered By: Payton Spark CMA (September 22, 2010 10:02 AM)  O2 Flow:  Room air CC: R side of neck swollen and tender x 2 days.   Primary Care Provider:  Madelin Headings MD  CC:  R side of neck swollen and tender x 2 days.[name]  History of Present Illness: 55 yo WF presents for R sided neck swelling.  No sore throat.  No fevers or chills.  She tried a warm washclot.  she has some tenderness with moving her neck.  She had all of her vaccinations as a child.  She lives alone.  she has had dental problems but denies pain over the R lower side.     Allergies: 1)  ! Penicillin 2)  ! Sulfa 3)  Amoxicillin (Amoxicillin) 4)  Hydrocodone  Past History:  Past Medical History: Reviewed history from 07/13/2009 and no changes required. 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 Riley Kill Dr. Everardo All  Social History: Reviewed history from 06/14/2010 and no changes required. Divorced Former Smoker still tobacco free Working [employer] no med insurance    Alcohol use-no   HH of 3 pet dog.  and MOM.  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  obese Head:  normocephalic.  R parotid swellling with mild tenderness, soft Eyes:  conjunctiva clear Nose:  no nasal discharge.   Mouth:  pharynx pink and moist and fair dentition.  no gingivval/ dental abscess in the R lower side.  Stensons Duct appears mildly inflammed able to swallow w/o difficulty Neck:  supple and no masses.  no anterior cervical or submandibular enlargement Lungs:  Normal respiratory effort, chest expands symmetrically. Lungs are clear to auscultation, no crackles or wheezes. Heart:  Normal rate and regular rhythm. S1 and S2 normal without gallop, murmur, click, rub or other extra sounds. Skin:  color normal and no rashes.     Impression & Recommendations:  Problem # 1:  SIALOLITHIASIS (ICD-527.5) Likely R parotid sialolithiasis given exam findings and lack of fevers, sore throat, dental pain. Since pain is mild and this is only day 2 and she does not have fevers or redness, advised her to try lemon drops today and tomororw and to apply topical warm compresses for comfort.  If not resolved by Monday, She needs to f/u with her PCP.  She will likely need a CT scan to make sure this is a  salivary stone which may need retrieval by ENT.  Info on this condition given to pt today.  Complete Medication List: 1)  Metoprolol Tartrate 25 Mg Tabs (Metoprolol tartrate) .... 1/2 by mouth two times a day 2)  Fluoxetine Hcl 20 Mg Caps (Fluoxetine hcl) .... Take 1 tablet by mouth two times a day 3)  Adult Aspirin Ec Low Strength 81 Mg Tbec (Aspirin) .... .qd 4)  Prilosec Otc 20 Mg Tbec (Omeprazole magnesium) .... As needed 5)  Klor-con M10 10 Meq Cr-tabs (Potassium chloride crys cr) .[name].. 1 by mouth three times a  day 6)  Fluocinonide 0.05 % Oint (Fluocinonide) .... Apply to hands     two times a day for hand dermatitis 7)  Nuvigil 150 Mg Tabs (Armodafinil) .[name].. 1 daily if needed 8)  Temazepam 15 Mg Caps (Temazepam) .[name].. 1-2 for sleep if needed 9)  Furosemide 20 Mg Tabs (Furosemide) .[name].. 1 daily as needed 10)  Levothroid 137 Mcg Tabs (Levothyroxine sodium) .[name].. 1 by mouth once daily 11)  Crestor 20 Mg Tabs (Rosuvastatin calcium) .... 1/2 by mouth once daily or as directed. ran out about a week ago 12)  Tramadol Hcl 50 Mg Tabs (Tramadol hcl) .[name].. 1 by mouth three times a day  as neede for chest wall pain 13)  Slow-mag 71.5-119 Mg Tbec (Magnesium cl-calcium carbonate) .... 2 by mouth two times a day  Patient Instructions: 1)  Read thru info on salivary gland stones. 2)  Suck on lemon drops this weekend and apply a warm compress over your neck/ jaw for comfort. 3)  If parotid swelling has not resolved by Monday, call Dr Fabian Sharp for further evalutation.   Orders Added: 1)  Est. Patient Level III [95621]

## 2010-10-11 NOTE — Assessment & Plan Note (Signed)
Summary: ROV   Visit Type:  Follow-up Primary Provider:  Madelin Headings MD  CC:  sob.  History of Present Illness: Patient has had some increased shortness of breath over the past few years.  Has been on CPAP, which she thinks was transfomational in her life.  She had daytime drowsiness before this, but did better after.  Still getting some mild chest tightness with walking.  She is not sure if related to chest wall, or if related to a cardiac cause.  Her SOB has definitely gotten worse.  Now it occurs if she walks to her car on flat ground, and some days would have to stop half way to the ccar.  Now has her degree in psychology.    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) 5)  Adverse Reaction To Medication  (ICD-995.29) 6)  Migraine Headache  (ICD-346.90) 7)  Chest Wall Pain, Anterior  (ICD-786.52) 8)  Disorder, Adjustment W/depressed Mood  (ICD-309.0) 9)  Family History of Cad Female 1st Degree Relative <60  (ICD-V16.49) 10)  Headache  (ICD-784.0) 11)  Hyperglycemia, Fasting  (ICD-790.29) 12)  Hx of Migraine Headache  (ICD-346.90) 13)  Symptom, Nausea With Vomiting  (ICD-787.01) 14)  Arthroscopy, Hx of  (ICD-V45.4) 15)  Bladder Suspension, Hx of  (ICD-V45.89) 16)  Herniorrhaphy, Hx of  (ICD-V45.89) 17)  Obstructive Sleep Apnea  (ICD-327.23) 18)  Osteomyelitis, Acute, Other Spec Site  (ICD-730.08) 19)  Atherosclerosis, Coronary, Artery Bypass Grft  (ICD-414.04) 20)  Hypothyroidism  (ICD-244.9) 21)  Hypertension  (ICD-401.9) 22)  Hyperlipidemia  (ICD-272.4) 23)  Coronary Artery Disease  (ICD-414.00) 24)  Asthma  (ICD-493.90)  Current Medications (verified): 1)  Metoprolol Tartrate 25 Mg Tabs (Metoprolol Tartrate) .... 1/2 By Mouth Two Times A Day 2)  Fluoxetine Hcl 20 Mg Caps (Fluoxetine Hcl) .... Take 1 Tablet By Mouth Two Times A Day 3)  Adult  Aspirin Ec Low Strength 81 Mg  Tbec (Aspirin) .... .qd 4)  Prilosec Otc 20 Mg  Tbec (Omeprazole Magnesium) 5)  Klor-Con M10 10 Meq Cr-Tabs (Potassium Chloride Crys Cr) .[name].. 1 By Mouth Three Times A Day 6)  Fluocinonide 0.05 % Oint (Fluocinonide) .... Apply To Hands     Two Times A Day For Hand Dermatitis 7)  Cpap 14 8)  Replacement Cpap Mask of Choice and Supplies 9)  Nuvigil 150 Mg Tabs (Armodafinil) .[name].. 1 Daily If Needed 10)  Temazepam 15 Mg Caps (Temazepam) .[name].. 1-2 For Sleep If Needed 11)  Furosemide 20 Mg Tabs (Furosemide) .[name].. 1 Daily As Needed 12)  Levothroid 137 Mcg Tabs (Levothyroxine Sodium) .[name].. 1 By Mouth Once Daily 13)  Crestor 20 Mg Tabs (Rosuvastatin Calcium) .... 1/2 By Mouth Once Daily or As Directed 14)  Cpap Mask of Choice and Supplies 15)  Tramadol Hcl 50 Mg Tabs (Tramadol Hcl) .[name].. 1 By Mouth Three Times A Day  As Neede For Chest Wall Pain 16)  Slow-Mag 71.5-119 Mg Tbec (Magnesium Cl-Calcium Carbonate) .... 2 By Mouth Two Times A Day  Allergies (verified): 1)  ! Penicillin 2)  ! 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 Riley Kill Dr. 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  and MOM.  Mom increased med needs and her health declining School- psychology  [employer]   grad in May. 2011     Risk Factors: Alcohol Use: <1 (06/14/2010) Caffeine Use: 1-2 (06/14/2010) Exercise: yes (06/14/2010)  Risk Factors: Smoking Status: quit > 6 months (06/14/2010)  Vital Signs:  Patient profile:   55 year old female Menstrual status:  postmenopausal Height:      64 inches Weight:      232 pounds Pulse rate:   60 / minute Pulse rhythm:   regular BP sitting:   142 / 80  Vitals Entered By: Jacquelin Hawking, CMA (August 06, 2010 2:36 PM)  Physical Exam  General:  Well developed, well nourished, in no acute distress. Head:  normocephalic and atraumatic Eyes:  PERRLA/EOM intact; conjunctiva and lids normal. Lungs:  Clear bilaterally to auscultation and percussion. Heart:  Normal S1 and S2.  Mild SEM.  No DM.   Extremities:  No clubbing or cyanosis. Neurologic:  Alert and oriented x 3.   EKG  Procedure date:  08/06/2010  Findings:      SB..  iRBBB.  Nonspecific T abnormality.  Echocardiogram  Procedure date:  12/27/2009  Findings:      Study Conclusions            - Left ventricle: The cavity size was normal. Wall thickness was       increased in a pattern of mild LVH. There was mild focal basal       hypertrophy of the septum. Systolic function was normal. The       estimated ejection fraction was in the range of 60% to 65%.        Regional wall motion abnormalities cannot be excluded. Doppler       parameters are consistent with abnormal left ventricular       relaxation (grade 1 diastolic dysfunction).     - Mitral valve: Calcified annulus.     - Left atrium: The atrium was mildly dilated.     - Right ventricle: The cavity size was mildly dilated.     - Right atrium: The atrium was mildly dilated.     Impressions:  Impression & Recommendations:  Problem # 1:  DYSPNEA (ICD-786.05) Has some exertional chest pain and also has had prior CABG associated with complications.  Previous stenting with restenosis, could not afford clopidogrel and chose non DES.  With recurrent symptoms would consider possible repeat catheterization, but some challenges with cost of procedure.  Also has mildly dilated RA and RV on last echo in April, and would favor checking CXR and VQ scan.   Her updated medication  list for this problem includes:    Metoprolol Tartrate 25 Mg Tabs (Metoprolol tartrate) .[name]... 1/2 by mouth two times a day    Adult Aspirin Ec Low Strength 81 Mg Tbec (Aspirin) ..... Marland Kitchenqd    Furosemide 20 Mg Tabs (Furosemide) .[name]... 1 daily as needed  Problem # 2:  HYPOTHYROIDISM (ICD-244.9) on replacement treatment. Her updated medication list for this problem includes:    Levothroid 137 Mcg Tabs (Levothyroxine sodium) .[name]... 1 by mouth once daily  Problem # 3:  HYPERTENSION (ICD-401.9) moderate control at present.   Her updated medication list for this problem includes:    Metoprolol Tartrate 25 Mg Tabs (Metoprolol tartrate) .[name]... 1/2 by mouth two times a day    Adult Aspirin Ec Low Strength 81 Mg Tbec (Aspirin) ..... Marland Kitchenqd    Furosemide 20 Mg Tabs (Furosemide) .[name]... 1 daily as needed  Problem # 4:  HYPERLIPIDEMIA (ICD-272.4) followed by Dr. Fabian Sharp.  Her updated medication list for this problem includes:    Crestor 20 Mg Tabs (Rosuvastatin calcium) .[name]... 1/2 by mouth once daily or as directed. ran out about a week ago  Problem  # 5:  CAD, ARTERY BYPASS GRAFT (ICD-414.04) See number 1.  Will do sequential workup as tolerated.   Her updated medication list for this problem includes:    Metoprolol Tartrate 25 Mg Tabs (Metoprolol tartrate) .[name]... 1/2 by mouth two times a day    Adult Aspirin Ec Low Strength 81 Mg Tbec (Aspirin) ..... Marland Kitchenqd  Other Orders: EKG w/ Interpretation (93000) Misc. Referral (Misc. Ref) T-2 View CXR (71020TC)  Patient Instructions: 1)  Your physician recommends that you schedule a follow-up appointment in: 1 WEEK 2)  Your physician recommends that you continue on your current medications as directed. Please refer to the Current Medication list given to you today. 3)  You have been referred for a VQ Scan.

## 2010-10-11 NOTE — Assessment & Plan Note (Signed)
Summary: f/u vq scan done 08-10-10 @ 7:15/sl   Visit Type:  Follow-up Primary Daphane Odekirk:  Madelin Headings MD  CC:  Some palpitations.  History of Present Illness: Does not do well without CPAP.  Psychologically does not seem correct without it.  Results of CXR and VQ reviewed with patient at time of office visit.  No change in clinical status.   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) 6)  Adverse Reaction To Medication  (ICD-995.29) 7)  Migraine Headache  (ICD-346.90) 8)  Chest Wall Pain, Anterior  (ICD-786.52) 9)  Disorder, Adjustment W/depressed Mood  (ICD-309.0) 10)  Family History of Cad Female 1st Degree Relative <60  (ICD-V16.49) 11)  Headache  (ICD-784.0) 12)  Hyperglycemia, Fasting  (ICD-790.29) 13)  Hx of Migraine Headache  (ICD-346.90) 14)  Symptom, Nausea With Vomiting  (ICD-787.01) 15)  Arthroscopy, Hx of  (ICD-V45.4) 16)  Bladder Suspension, Hx of  (ICD-V45.89) 17)  Herniorrhaphy, Hx of  (ICD-V45.89) 18)  Obstructive Sleep Apnea  (ICD-327.23) 19)  Osteomyelitis, Acute, Other Spec Site  (ICD-730.08) 20)  Atherosclerosis, Coronary, Artery Bypass Grft  (ICD-414.04) 21)  Hypothyroidism  (ICD-244.9) 22)  Hypertension  (ICD-401.9) 23)  Hyperlipidemia  (ICD-272.4) 24)  Coronary Artery Disease  (ICD-414.00) 25)  Asthma  (ICD-493.90)  Current Medications (verified): 1)  Metoprolol Tartrate 25 Mg Tabs (Metoprolol Tartrate) .... 1/2 By Mouth Two Times A Day 2)  Fluoxetine Hcl 20 Mg Caps (Fluoxetine Hcl) .... Take 1 Tablet By Mouth Two Times A Day 3)  Adult Aspirin Ec Low Strength 81 Mg  Tbec (Aspirin) .... .qd 4)  Prilosec Otc 20 Mg  Tbec (Omeprazole Magnesium) .... As Needed 5)  Klor-Con M10 10 Meq Cr-Tabs (Potassium Chloride Crys Cr) .[name].. 1 By Mouth Three Times A Day 6)  Fluocinonide 0.05 % Oint  (Fluocinonide) .... Apply To Hands     Two Times A Day For Hand Dermatitis 7)  Nuvigil 150 Mg Tabs (Armodafinil) .[name].. 1 Daily If Needed 8)  Temazepam 15 Mg Caps (Temazepam) .[name].. 1-2 For Sleep If Needed 9)  Furosemide 20 Mg Tabs (Furosemide) .[name].. 1 Daily As Needed 10)  Levothroid 137 Mcg Tabs (Levothyroxine Sodium) .[name].. 1 By Mouth Once Daily 11)  Crestor 20 Mg Tabs (Rosuvastatin Calcium) .... 1/2 By Mouth Once Daily or As Directed. Ran Out About A Week Ago 12)  Tramadol Hcl 50 Mg Tabs (Tramadol Hcl) .[name].. 1 By Mouth Three Times A Day  As Neede For Chest Wall Pain 13)  Slow-Mag 71.5-119 Mg Tbec (Magnesium Cl-Calcium Carbonate) .... 2 By Mouth Two Times A Day  Allergies: 1)  ! Penicillin 2)  ! Sulfa 3)  Amoxicillin (Amoxicillin) 4)  Hydrocodone  Vital Signs:  Patient profile:   55 year old female Menstrual status:  postmenopausal Height:      64 inches Weight:      230.75 pounds BMI:     39.75 Pulse rate:   62 / minute Pulse rhythm:   regular Resp:     18 per minute BP sitting:   160 / 90  (left arm) Cuff size:   large  Vitals Entered By: Vikki Ports (August 10, 2010 10:50 AM)  Physical Exam  General:  Well developed, well nourished, in no acute distress. Head:  normocephalic and atraumatic Lungs:  Clear bilaterally to auscultation and  percussion. Heart:  Pos S4 gallop. Extremities:  No clubbing or cyanosis. Neurologic:  Alert and oriented x 3.   EKG  Procedure date:  08/10/2010  Findings:      NSR.  IRBBB.  LVH with repolarization abnormality.  CXR  Procedure date:  08/10/2010  Findings:      CHEST - 2 VIEW   Comparison: 07/21/2009   Findings: Previous coronary bypass noted.  Stable cardiomegaly with vascular congestion and interstitial prominence.  Postop clips over the right upper lobe.  No definite edema, consolidation, pneumonia, effusion or pneumothorax.  Midline trachea.  Stable exam.   IMPRESSION: Postop changes. Cardiomegaly with vascular  congestion.   Read By:  Sigurd Sos.,  M.D.     Released By:  Sigurd Sos.,  M.D.  VQ Scan  Procedure date:  08/10/2010  Findings:      Comparison:  Chest x-ray from today.   Findings: Multiplanar perfusion imaging shows some patchy decreased perfusion in the upper lobes, but there is no peripheral wedge shaped perfusion defect in either lung.   Ventilation imaging shows slightly asymmetric wash-in, decreased on the right, with some subtle air trapping in the upper lobes.   IMPRESSION: Low probability for pulmonary embolus.   Emphysema.    Impression & Recommendations:  Problem # 1:  DYSPNEA (ICD-786.05) Talked about various strategies.  Would favor recath to assess coronary anatomy and findings.  She is not in disagreement.  Currently insurance situation is a challenge for her, and I told her I would help in any way possible.  Will continue to see in close follow up.  Her updated medication list for this problem includes:    Metoprolol Tartrate 25 Mg Tabs (Metoprolol tartrate) .[name]... 1/2 by mouth two times a day    Adult Aspirin Ec Low Strength 81 Mg Tbec (Aspirin) ..... Marland Kitchenqd    Furosemide 20 Mg Tabs (Furosemide) .[name]... 1 daily as needed  Orders: EKG w/ Interpretation (93000)  Patient Instructions: 1)  Your physician recommends that you schedule a follow-up appointment in: 4 TO 6 WEEKS. 2)  Your physician recommends that you continue on your current medications as directed. Please refer to the Current Medication list given to you today.

## 2010-10-11 NOTE — Letter (Signed)
Summary: Generic Letter  [facility], Main Office  [address]   [place], [place] [address]   Phone: [phone]  Fax: [phone]        August 22, 2010 MRN: [id number]    [name] [address] [place], [place]  [address]    To whom it may concern:  [name] is a patient under my care.   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  She has hypertension, hyperlipidemia, and had post op surgical incision infection.  She also has obstructive sleep apnea.  This verifies a presumptive medical condition.     Sincerely,  Ronaldo Miyamoto, MD, [facility]  This letter has been electronically signed by your physician.

## 2010-10-11 NOTE — Progress Notes (Signed)
Summary: Temazepam refills requested  Phone Note Refill Request Message from:  Fax from Pharmacy on September 04, 2010 4:43 PM  Refills Requested: Medication #1:  TEMAZEPAM 15 MG CAPS 1-2 for sleep if needed   Dosage confirmed as above?Dosage Confirmed   Brand Name Necessary? No   Supply Requested: 1 month   Last Refilled: 08/06/2010   Notes: Dr. Jetty Duhamel is prescribing physician Refill request from [facility] [phone]. Pt cancelled OV w/ CDY on 08/29/2010.   Method Requested: Telephone to Pharmacy Next Appointment Scheduled: no pending appts  Follow-up for Phone Call        Pls advise if okay to refill this medication.Michel Bickers [facility]  September 04, 2010 4:47 PM   Per CDY-okay to refill 1 month x 1 refill no more refills until seen by CDY.Reynaldo Minium CMA  September 04, 2010 5:09 PM   Called refill to [facility] [address]Reynaldo Minium CMA  September 04, 2010 5:09 PM

## 2010-10-11 NOTE — Assessment & Plan Note (Signed)
Summary: 4-6 [email or web address]   Visit Type:  Follow-up Primary Provider:  Madelin Headings MD  CC:  Sob.  History of Present Illness: Now has some insurance, and wants to proceed with cath study.  She says there is a definite change in exercise tolerance gradually worsening over past six months.    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) 6)  Adverse Reaction To Medication  (ICD-995.29) 7)  Migraine Headache  (ICD-346.90) 8)  Chest Wall Pain, Anterior  (ICD-786.52) 9)  Disorder, Adjustment W/depressed Mood  (ICD-309.0) 10)  Family History of Cad Female 1st Degree Relative <60  (ICD-V16.49) 11)  Headache  (ICD-784.0) 12)  Hyperglycemia, Fasting  (ICD-790.29) 13)  Hx of Migraine Headache  (ICD-346.90) 14)  Symptom, Nausea With Vomiting  (ICD-787.01) 15)  Arthroscopy, Hx of  (ICD-V45.4) 16)  Bladder Suspension, Hx of  (ICD-V45.89) 17)  Herniorrhaphy, Hx of  (ICD-V45.89) 18)  Obstructive Sleep Apnea  (ICD-327.23) 19)  Osteomyelitis, Acute, Other Spec Site  (ICD-730.08) 20)  Atherosclerosis, Coronary, Artery Bypass Grft  (ICD-414.04) 21)  Hypothyroidism  (ICD-244.9) 22)  Hypertension  (ICD-401.9) 23)  Hyperlipidemia  (ICD-272.4) 24)  Coronary Artery Disease  (ICD-414.00) 25)  Asthma  (ICD-493.90)  Current Medications (verified): 1)  Metoprolol Tartrate 25 Mg Tabs (Metoprolol Tartrate) .... 1/2 By Mouth Two Times A Day 2)  Fluoxetine Hcl 20 Mg Caps (Fluoxetine Hcl) .... Take 1 Tablet By Mouth Two Times A Day 3)  Adult Aspirin Ec Low Strength 81 Mg  Tbec (Aspirin) .... .qd 4)  Prilosec Otc 20 Mg  Tbec (Omeprazole Magnesium) .... As Needed 5)  Klor-Con M10 10 Meq Cr-Tabs (Potassium Chloride Crys Cr) .[name].. 1 By Mouth Three Times A Day 6)  Fluocinonide 0.05 % Oint (Fluocinonide) .... Apply To Hands     Two Times A Day For  Hand Dermatitis 7)  Nuvigil 150 Mg Tabs (Armodafinil) .[name].. 1 Daily If Needed 8)  Temazepam 15 Mg Caps (Temazepam) .[name].. 1-2 For Sleep If Needed 9)  Furosemide 20 Mg Tabs (Furosemide) .[name].. 1 Daily As Needed 10)  Levothroid 137 Mcg Tabs (Levothyroxine Sodium) .[name].. 1 By Mouth Once Daily 11)  Crestor 20 Mg Tabs (Rosuvastatin Calcium) .... 1/2 By Mouth Once Daily or As Directed. Ran Out About A Week Ago 12)  Tramadol Hcl 50 Mg Tabs (Tramadol Hcl) .[name].. 1 By Mouth Three Times A Day  As Neede For Chest Wall Pain 13)  Slow-Mag 71.5-119 Mg Tbec (Magnesium Cl-Calcium Carbonate) .... 2 By Mouth Two Times A Day  Allergies: 1)  ! Penicillin 2)  ! Sulfa 3)  Amoxicillin (Amoxicillin) 4)  Hydrocodone  Vital Signs:  Patient profile:   55 year old female Menstrual status:  postmenopausal Height:      64 inches Weight:      234 pounds BMI:     40.31 O2 Sat:      98 % on Room air Pulse rate:   76 / minute Pulse rhythm:   regular Resp:     20 per minute BP sitting:   140 / 80  (left arm) Cuff size:   large  Vitals Entered By: Vikki Ports (September 05, 2010 10:11 AM)  O2 Flow:  Room air  Physical Exam  General:  Well developed, well nourished, in no acute distress. Head:  normocephalic and atraumatic Neck:  No carotid bruits Lungs:  Clear bilaterally to auscultation and percussion. Heart:  PMI not related due to sternal removal..  Normal S1 and S2 with occassional extrasystole.  Pulses:  pulses normal in all 4 extremities Extremities:  No clubbing or cyanosis. Neurologic:  Alert and oriented x 3.   Impression & Recommendations:  Problem # 1:  CAD, ARTERY BYPASS GRAFT (ICD-414.04) She would like to proceed in mid January.  I think we do need to find out status of grafts with progressive symptoms.  She is agreeable, and understands the risks Her updated medication list for this problem includes:    Metoprolol Tartrate 25 Mg Tabs (Metoprolol tartrate) .[name]... 1/2 by mouth two times a day     Adult Aspirin Ec Low Strength 81 Mg Tbec (Aspirin) ..... Marland Kitchenqd  Problem # 2:  DYSPNEA (ICD-786.05) likely secondary to number 1--we will evaluate in more detail. Her updated medication list for this problem includes:    Metoprolol Tartrate 25 Mg Tabs (Metoprolol tartrate) .[name]... 1/2 by mouth two times a day    Adult Aspirin Ec Low Strength 81 Mg Tbec (Aspirin) ..... Marland Kitchenqd    Furosemide 20 Mg Tabs (Furosemide) .[name]... 1 daily as needed  Problem # 3:  HYPERLIPIDEMIA (ICD-272.4) remains on lipid lowering therapy. Her updated medication list for this problem includes:    Crestor 20 Mg Tabs (Rosuvastatin calcium) .[name]... 1/2 by mouth once daily or as directed. ran out about a week ago  Other Orders: Cardiac Catheterization (Cardiac Cath)  Patient Instructions: 1)  Your physician recommends that you schedule a follow-up appointment. Will be scheduled after your cath.  2)  Your physician recommends that you return for lab work on 09/24/10 any time after 8:30am.  3)  Your physician recommends that you continue on your current medications as directed. Please refer to the Current Medication list given to you today. 4)  Your physician has requested that you have a cardiac catheterization on 09/27/10 @ 0830. Cardiac catheterization is used to diagnose and/or treat various heart conditions. Doctors may recommend this procedure for a number of different reasons. The most common reason is to evaluate chest pain. Chest pain can be a symptom of coronary artery disease (CAD), and cardiac catheterization can show whether plaque is narrowing or blocking your heart's arteries. This procedure is also used to evaluate the valves, as well as measure the blood flow and oxygen levels in different parts of your heart.  For further information please visit [email or web address]  Please follow instruction sheet, as given.

## 2010-10-11 NOTE — Letter (Signed)
Summary: [employer]  [employer]   Imported By: Marylou Mccoy 09/11/2010 17:10:33  _____________________________________________________________________  External Attachment:    Type:   Image     Comment:   External Document

## 2010-10-11 NOTE — Progress Notes (Signed)
Summary: Need letter from Dr.Stuckey for insurance  Phone Note Call from Patient Call back at [phone]   Caller: Patient Summary of Call: pt apply for insurance and need a letter from Dr.Stuckey with her D/X information  Initial call taken by: Judie Grieve,  August 16, 2010 4:53 PM  Follow-up for Phone Call        Pt. needs a letter from Dr. Riley Kill for [employer] verifying what her diagnosis are for her medical condition of open heart surgery, HTN, angioplasty, CAD, COPD.  She will fax the information to our office. I explained to her that Dr. Riley Kill will not be in the office until Tuesday 12/13. She understands. Whitney Maeola Sarah RN  August 16, 2010 5:12 PM  Follow-up by: Whitney Maeola Sarah RN,  August 16, 2010 5:12 PM

## 2010-10-11 NOTE — Letter (Signed)
Summary: Cardiac Catheterization Instructions- [facility]  [employer], Main Office  [address]   [place], [place] [address]   Phone: [phone]  Fax: [phone]     09/05/2010 MRN: [id number]  [name] [address] [place], [place]  [address]  Dear [name],   You are scheduled for a Cardiac Catheterization on 09/27/10 with Dr.Stuckey.   Please arrive to the 1st floor of the [facility] and V[facility] Center at [facility] at 7:30 am on the day of your procedure. Please do not arrive before 6:30 a.m. Call the Heart and Vascular Center at [phone] if you are unable to make your appointmnet. The Code to get into the parking garage under the building is 0030. Take the elevators to the 1st floor. You must have someone to drive you home. Someone must be with you for the first 24 hours after you arrive home. Please wear clothes that are easy to get on and off and wear slip-on shoes. Do not eat or drink after midnight except water with your medications that morning. Bring all your medications and current insurance cards with you.    _x__ Make sure you take your aspirin.  __x_ You may take ALL of your medications with water that morning. ________________________________________________________________________________________________________________________________    The usual length of stay after your procedure is 2 to 3 hours. This can vary.  If you have any questions, please call the office at the number listed above.   Whitney Maeola Sarah RN

## 2010-10-12 ENCOUNTER — Other Ambulatory Visit: Payer: Self-pay | Admitting: Internal Medicine

## 2010-10-17 NOTE — Progress Notes (Signed)
Summary: pt calling ct results  Phone Note Call from Patient   Caller: Patient [phone] Reason for Call: Talk to Nurse, Lab or Test Results Summary of Call: pt calling for ct results Initial call taken by: Glynda Jaeger,  October 09, 2010 1:36 PM  Follow-up for Phone Call        Pt. given CT results. Impression read to her as per instructions from Dr. Riley Kill.  Pt given information regarding possible cath. Pt aware Dr. Riley Kill will call her when he is back in office next week. Pt states she would like to proceed with cath as soon as recommended by Dr. Riley Kill. Follow-up by: Dossie Arbour, RN, BSN,  October 09, 2010 2:13 PM

## 2010-10-17 NOTE — Assessment & Plan Note (Signed)
Summary: Med recheck/cb   Vital Signs:  Patient profile:   55 year old female Menstrual status:  postmenopausal Weight:      233 pounds Pulse rate:   75 / minute BP sitting:   140 / 82  (left arm) Cuff size:   large  Vitals Entered By: Romualdo Bolk, CMA (AAMA) (October 02, 2010 1:15 PM) CC: Pt is having problems with depression. Prozac is not working.   History of Present Illness: [name] comes in today  for Sda for above reason. Having increaing mood issue and crying ov er the last 3-4 weeks  and  feels more depressed.  had a recnet cath showed stable CAD  mom sick recently  just got out of rehab for hip fx and  having COPD flare.    Leg cramps are better  on mg and now on 1 by mouth two times a day but not checked recnetly   had lab per Dr Kathie Rhodes last week and K was nl.  ? not on a diuretic.  IN the past has used paxil pamelor,effexor and paxil with some se .  had used wellbutrin but ? went off when lost insurance .  Is  Preventive Screening-Counseling & Management  Alcohol-Tobacco     Alcohol drinks/day: <1     Alcohol type: wine     Smoking Status: quit > 6 months     Year Quit: 2006     Tobacco Counseling: to remain off tobacco products  Caffeine-Diet-Exercise     Caffeine use/day: 1-2     Does Patient Exercise: yes     Type of exercise: walking on campus  Current Medications (verified): 1)  Metoprolol Tartrate 25 Mg Tabs (Metoprolol Tartrate) .... 1/2 By Mouth Two Times A Day 2)  Fluoxetine Hcl 20 Mg Caps (Fluoxetine Hcl) .... Take 2 Tablet By Mouth Once Daily 3)  Adult Aspirin Ec Low Strength 81 Mg  Tbec (Aspirin) .... .qd 4)  Prilosec Otc 20 Mg  Tbec (Omeprazole Magnesium) .... As Needed 5)  Klor-Con M10 10 Meq Cr-Tabs (Potassium Chloride Crys Cr) .[name].. 1 By Mouth Three Times A Day 6)  Fluocinonide 0.05 % Oint (Fluocinonide) .... Apply To Hands     Two Times A Day For Hand Dermatitis 7)  Nuvigil 150 Mg Tabs (Armodafinil) .[name].. 1 Daily If Needed 8)  Temazepam  15 Mg Caps (Temazepam) .[name].. 1-2 For Sleep If Needed 9)  Levothroid 137 Mcg Tabs (Levothyroxine Sodium) .[name].. 1 By Mouth Once Daily 10)  Crestor 20 Mg Tabs (Rosuvastatin Calcium) .... 1/2 By Mouth Once Daily or As Directed. Ran Out About A Week Ago 11)  Tramadol Hcl 50 Mg Tabs (Tramadol Hcl) .[name].. 1 By Mouth Three Times A Day  As Neede For Chest Wall Pain 12)  Slow-Mag 71.5-119 Mg Tbec (Magnesium Cl-Calcium Carbonate) .... 2 By Mouth Two Times A Day  Allergies (verified): 1)  ! Penicillin 2)  ! Sulfa 3)  Amoxicillin (Amoxicillin) 4)  Hydrocodone  Past History:  Past medical, surgical, family and social histories (including risk factors) reviewed for relevance to current acute and chronic problems.  Past Medical History: Reviewed history from 07/13/2009 and no changes required. 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 Riley Kill Dr. Everardo All  Past Surgical History: Coronary artery bypass graft Debriment for infection in chest  Chest reconstruction Bilateral knee surgery  Bladder surgery Lf Mastoidectomy Hernia repair Cath 2012   Past History:  Care Management: Pulmonary: [facility]  Family History: Reviewed history from 07/13/2009 and no changes required. Family History of CAD Female 1st degree relative <60 Family History High cholesterol Family History  Hypertension Family History of Arthritis RA mom Son  Substance abuse  Mom Dm and recently weight loss   MOm hip fracture .      Social History: Reviewed history from 06/14/2010 and no changes required. Divorced Former Smoker still tobacco free Working [employer] no med insurance  just got [employer]  Alcohol use-no   HH of 3 pet dog.  and MOM.  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         inc hair on chin past mennopause ? if a hormonal problem .  no inc hair on trunk  or stria.   no acne no  vertexbalding  Physical Exam  General:  Well-developed,well-nourished,in no acute distress; alert,appropriate and cooperative throughout examination Head:  normocephalic and atraumatic.   Eyes:  vision grossly intact.   Neck:  No deformities, masses, or tenderness noted. Lungs:  Normal respiratory effort, chest expands symmetrically. Lungs are clear to auscultation, no crackles or wheezes. Heart:  Normal rate and regular rhythm. S1 and S2 normal without gallop, murmur, click, rub or other extra sounds. Neurologic:  no nfo cal  Skin:  some inc hair on chin but not elsewhere Psych:  Oriented X3, normally interactive, and not depressed appearing.  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    no se of wellb in past .. will add augmentation.  therapy with wellbutrin Expectant management   Problem # 2:  HYPOMAGNESEMIA (ICD-275.2) still on low dose mg    need to check and may need to stop since she is no longer on  diuretics    will have to review record  as to this situation about her K and Mg issues.  She says only lasix a few day s amonth.  Orders: TLB-Magnesium (Mg) (83735-MG)  Problem # 3:  CORONARY ARTERY DISEASE (ICD-414.00) under reevaluation  and  recent cath The following medications were removed from the medication list:    Furosemide 20 Mg Tabs (Furosemide) .[name]... 1 daily as needed Her updated medication list for this problem includes:    Metoprolol Tartrate 25 Mg Tabs (Metoprolol tartrate) .[name]... 1/2 by mouth two times a day    Adult Aspirin Ec Low Strength 81 Mg Tbec (Aspirin) ..... Marland Kitchenqd  Problem # 4:  HYPERTENSION (ICD-401.9)  The following medications were removed from the medication list:    Furosemide 20 Mg Tabs (Furosemide) .[name]... 1 daily as needed Her updated medication list for this problem includes:    Metoprolol Tartrate  25 Mg Tabs (Metoprolol tartrate) .[name]... 1/2 by mouth two times a day  Problem # 5:  MUSCLE CRAMPS (ICD-729.82) slightly improved.  Complete Medication List: 1)  Metoprolol Tartrate 25 Mg Tabs (Metoprolol tartrate) .... 1/2 by mouth two times a day 2)  Fluoxetine Hcl 20 Mg Caps (Fluoxetine hcl) .... Take 2 tablet by mouth once daily 3)  Adult Aspirin Ec Low Strength 81 Mg Tbec (Aspirin) .... .qd 4)  Prilosec Otc 20 Mg Tbec (Omeprazole magnesium) .... As needed 5)  Klor-con M10 10 Meq Cr-tabs (Potassium chloride crys cr) .[name].. 1 by mouth three times a day 6)  Fluocinonide 0.05 % Oint (Fluocinonide) .... Apply to hands     two times a day for hand dermatitis 7)  Nuvigil 150 Mg Tabs (Armodafinil) .[name].. 1 daily if needed 8)  Temazepam 15 Mg Caps (Temazepam) .[name].. 1-2 for sleep if needed 9)  Levothroid 137 Mcg Tabs (Levothyroxine sodium) .[name].. 1 by mouth once daily 10)  Crestor 20 Mg Tabs (Rosuvastatin calcium) .... 1/2 by mouth once daily or as directed. ran out about a week ago 11)  Tramadol Hcl 50 Mg Tabs (Tramadol hcl) .[name].. 1 by mouth three times a day  as neede for  chest wall pain 12)  Slow-mag 71.5-119 Mg Tbec (Magnesium cl-calcium carbonate) .... 2 by mouth two times a day 13)  Bupropion Hcl 150 Mg Xr24h-tab (Bupropion hcl) .[name].. 1 by mouth once daily  may increase to 2 by mouth once daily after 3-4 weeks  Other Orders: Specimen Handling (16109) Venipuncture (60454)  Patient Instructions: 1)  continue prozac 40 per day 2)  add on wellbutrin 150 can increase to 2 by mouth once daily after 3 weeks or so.  3)  return office visit in 3-4 weeks or as needed.  Prescriptions: FLUOXETINE HCL 20 MG CAPS (FLUOXETINE HCL) Take 2 tablet by mouth once daily  #180 x 1   Entered and Authorized by:   Madelin Headings MD   Signed by:   Madelin Headings MD on 10/02/2010   Method used:   Electronically to        [id number]* (retail)       [address]       [place], [place]  [address]       Ph: [phone]       Fax: [phone]   RxID:   [id number] BUPROPION HCL 150 MG XR24H-TAB (BUPROPION HCL) 1 by mouth once daily  may increase to 2 by mouth once daily after 3-4 weeks  #60 x 1   Entered and Authorized by:   Madelin Headings MD   Signed by:   Madelin Headings MD on 10/02/2010   Method used:   Electronically to        [facility] [address] (retail)       [address]       [place], [place]  [address]       Ph: [phone]       Fax: [phone]   RxID:   [id number]    Orders Added: 1)  TLB-Magnesium (Mg) [83735-MG] 2)  Specimen Handling [99000] 3)  Venipuncture [16010] 4)  Est. Patient Level IV [93235]

## 2010-10-24 ENCOUNTER — Telehealth: Payer: Self-pay | Admitting: Cardiology

## 2010-10-30 ENCOUNTER — Ambulatory Visit (HOSPITAL_COMMUNITY)
Admission: RE | Admit: 2010-10-30 | Discharge: 2010-10-30 | Disposition: A | Discharge status: Home / Self Care | Payer: PRIVATE HEALTH INSURANCE | Source: Ambulatory Visit | Attending: Cardiology | Admitting: Cardiology

## 2010-10-30 ENCOUNTER — Other Ambulatory Visit: Payer: PRIVATE HEALTH INSURANCE

## 2010-10-30 DIAGNOSIS — Z01810 Encounter for preprocedural cardiovascular examination: Secondary | ICD-10-CM | POA: Insufficient documentation

## 2010-10-30 DIAGNOSIS — R0989 Other specified symptoms and signs involving the circulatory and respiratory systems: Secondary | ICD-10-CM | POA: Insufficient documentation

## 2010-10-30 DIAGNOSIS — R0602 Shortness of breath: Secondary | ICD-10-CM

## 2010-10-30 DIAGNOSIS — R0609 Other forms of dyspnea: Secondary | ICD-10-CM | POA: Insufficient documentation

## 2010-10-30 DIAGNOSIS — I2510 Atherosclerotic heart disease of native coronary artery without angina pectoris: Secondary | ICD-10-CM | POA: Insufficient documentation

## 2010-10-30 LAB — POCT I-STAT 3, VENOUS BLOOD GAS (G3P V)
Bicarbonate: 26.5 mEq/L — ABNORMAL HIGH (ref 20.0–24.0)
TCO2: 28 mmol/L (ref 0–100)

## 2010-10-30 LAB — BASIC METABOLIC PANEL
BUN: 8 mg/dL (ref 6–23)
Chloride: 108 mEq/L (ref 96–112)
Glucose, Bld: 148 mg/dL — ABNORMAL HIGH (ref 70–99)
Potassium: 3.4 mEq/L — ABNORMAL LOW (ref 3.5–5.1)

## 2010-10-30 LAB — CBC
HCT: 33.9 % — ABNORMAL LOW (ref 36.0–46.0)
MCV: 87.1 fL (ref 78.0–100.0)
RBC: 3.89 MIL/uL (ref 3.87–5.11)
RDW: 13 % (ref 11.5–15.5)
WBC: 6.2 10*3/uL (ref 4.0–10.5)

## 2010-10-31 NOTE — Progress Notes (Addendum)
Summary: Want to schedule cath  Phone Note Call from Patient Call back at [phone]   Caller: Patient Summary of Call: Pt calling about scheduling her cath and need samples of Crestor  Initial call taken by: Judie Grieve,  October 24, 2010 11:47 AM  Follow-up for Phone Call        This is best number to reach pt.  The pt is aware that Dr Riley Kill is going to contact her about treatment plan.  Crestor samples placed at the front desk.  Julieta Gutting, RN, BSN  October 24, 2010 12:11 PM  Additional Follow-up for Phone Call Additional follow up Details #1::        Please schedule patient for cath with Dr. Shirlee Latch R heart for next week.  Case will likely need to be done from internal jugular with doppler.   Additional Follow-up by: Ronaldo Miyamoto, MD, [facility],  October 24, 2010 4:22 PM    Additional Follow-up for Phone Call Additional follow up Details #2::    Note sent to Dr Riley Kill because I am unsure if he spoke with this pt about procedure.  Dr Shirlee Latch is not scheduled in the cath lab again until 11/20/10. I received a response from Dr Riley Kill and he has not spoken with the pt at this time.  He will contact the pt before I schedule cath. Dr Riley Kill would like to try and have Dr Shirlee Latch perform cath next week in main lab as a 7:30 case.  Julieta Gutting, RN, BSN  October 25, 2010 11:17 AM  Additional Follow-up for Phone Call Additional follow up Details #3:: Details for Additional Follow-up Action Taken: I reviewed with Dr Arnold Long heart cath scheduled for 10/30/10 at 7:30am Main Lab- [facility] approach--I discussed with pt and she verbalized understanding

## 2010-11-02 NOTE — Procedures (Signed)
  NAMEDORE, OQUIN NO.:  [id number]  MEDICAL RECORD NO.:  [id number]           PATIENT TYPE:  O  LOCATION:  [facility]                         FACILITY:  [facility]  PHYSICIAN:  Marca Ancona, MD      DATE OF BIRTH:  1956-03-25  DATE OF PROCEDURE:  10/30/2010 DATE OF DISCHARGE:                           CARDIAC CATHETERIZATION   PROCEDURE:  Right heart catheterization.  INDICATIONS:  This is a 55 year old with a history of CAD status post bypass grafting, who has dyspnea that seems to be out of proportion to her degree of coronary disease.  PROCEDURE NOTE:  After informed consent was obtained, the right neck was sterilely prepped and draped.  Lidocaine 1% was used to locally anesthetize the right neck area.  The right internal jugular vein was accessed using modified Seldinger technique under ultrasound guidance. A 6-French venous sheath was placed.  5-French Swan-Ganz balloon tip catheter was used to carry out the right heart catheterization with a sample removed from the PA for oxygen saturation measurement.  There were no complications.  FINDINGS:  Mean right atrial pressure 13 mmHg, RV pressure 49/11 mmHg, PA 49/21 mmHg with mean PA pressure 37 mmHg, mean pulmonary capillary wedge pressure 24 mmHg.  Cardiac output 5.2 liters per minute.  Cardiac index 2.55.  mixed venous oxygen saturation 64%.  IMPRESSION:  This is a 55 year old with coronary artery disease, status post bypass grafting.  She was found on this right heart catheterization to have elevated left and right heart filling pressures.  The pressure do not appear consistent with pericardial constriction.  I would add a diuretic, and we will discuss this with Dr. Riley Kill.     Marca Ancona, MD     DM/MEDQ  D:  10/30/2010  T:  10/30/2010  Job:  [id number]  cc:   Arturo Morton. Riley Kill, MD, [facility]  Electronically Signed by Marca Ancona MD on 11/02/2010 11:03:26 AM

## 2010-11-06 ENCOUNTER — Encounter: Payer: Self-pay | Admitting: Cardiology

## 2010-11-06 ENCOUNTER — Other Ambulatory Visit (INDEPENDENT_AMBULATORY_CARE_PROVIDER_SITE_OTHER): Payer: PRIVATE HEALTH INSURANCE

## 2010-11-06 ENCOUNTER — Other Ambulatory Visit: Payer: Self-pay | Admitting: Cardiology

## 2010-11-06 DIAGNOSIS — I25810 Atherosclerosis of coronary artery bypass graft(s) without angina pectoris: Secondary | ICD-10-CM

## 2010-11-06 LAB — BASIC METABOLIC PANEL
Calcium: 9.1 mg/dL (ref 8.4–10.5)
Creatinine, Ser: 0.9 mg/dL (ref 0.4–1.2)

## 2010-11-07 ENCOUNTER — Encounter (HOSPITAL_COMMUNITY): Payer: Self-pay | Admitting: Radiology

## 2010-11-07 ENCOUNTER — Emergency Department (HOSPITAL_COMMUNITY): Payer: PRIVATE HEALTH INSURANCE

## 2010-11-07 ENCOUNTER — Inpatient Hospital Stay (HOSPITAL_COMMUNITY)
Admission: EM | Admit: 2010-11-07 | Discharge: 2010-11-13 | DRG: 065 | Disposition: A | Discharge status: Rehabilitation Facility | Payer: PRIVATE HEALTH INSURANCE | Attending: Internal Medicine | Admitting: Internal Medicine

## 2010-11-07 DIAGNOSIS — G8190 Hemiplegia, unspecified affecting unspecified side: Secondary | ICD-10-CM | POA: Diagnosis present

## 2010-11-07 DIAGNOSIS — N390 Urinary tract infection, site not specified: Secondary | ICD-10-CM | POA: Diagnosis not present

## 2010-11-07 DIAGNOSIS — Z882 Allergy status to sulfonamides status: Secondary | ICD-10-CM

## 2010-11-07 DIAGNOSIS — I2510 Atherosclerotic heart disease of native coronary artery without angina pectoris: Secondary | ICD-10-CM | POA: Diagnosis present

## 2010-11-07 DIAGNOSIS — R1310 Dysphagia, unspecified: Secondary | ICD-10-CM | POA: Diagnosis present

## 2010-11-07 DIAGNOSIS — R29810 Facial weakness: Secondary | ICD-10-CM | POA: Diagnosis present

## 2010-11-07 DIAGNOSIS — J449 Chronic obstructive pulmonary disease, unspecified: Secondary | ICD-10-CM | POA: Diagnosis present

## 2010-11-07 DIAGNOSIS — E785 Hyperlipidemia, unspecified: Secondary | ICD-10-CM | POA: Diagnosis present

## 2010-11-07 DIAGNOSIS — G4733 Obstructive sleep apnea (adult) (pediatric): Secondary | ICD-10-CM | POA: Diagnosis present

## 2010-11-07 DIAGNOSIS — F3289 Other specified depressive episodes: Secondary | ICD-10-CM | POA: Diagnosis present

## 2010-11-07 DIAGNOSIS — Z951 Presence of aortocoronary bypass graft: Secondary | ICD-10-CM

## 2010-11-07 DIAGNOSIS — E876 Hypokalemia: Secondary | ICD-10-CM | POA: Diagnosis present

## 2010-11-07 DIAGNOSIS — E039 Hypothyroidism, unspecified: Secondary | ICD-10-CM | POA: Diagnosis present

## 2010-11-07 DIAGNOSIS — R471 Dysarthria and anarthria: Secondary | ICD-10-CM | POA: Diagnosis present

## 2010-11-07 DIAGNOSIS — F329 Major depressive disorder, single episode, unspecified: Secondary | ICD-10-CM | POA: Diagnosis present

## 2010-11-07 DIAGNOSIS — Z87891 Personal history of nicotine dependence: Secondary | ICD-10-CM

## 2010-11-07 DIAGNOSIS — J4489 Other specified chronic obstructive pulmonary disease: Secondary | ICD-10-CM | POA: Diagnosis present

## 2010-11-07 DIAGNOSIS — Z880 Allergy status to penicillin: Secondary | ICD-10-CM

## 2010-11-07 DIAGNOSIS — E669 Obesity, unspecified: Secondary | ICD-10-CM | POA: Diagnosis present

## 2010-11-07 DIAGNOSIS — I6330 Cerebral infarction due to thrombosis of unspecified cerebral artery: Principal | ICD-10-CM | POA: Diagnosis present

## 2010-11-07 DIAGNOSIS — I63511 Cerebral infarction due to unspecified occlusion or stenosis of right middle cerebral artery: Secondary | ICD-10-CM

## 2010-11-07 DIAGNOSIS — I10 Essential (primary) hypertension: Secondary | ICD-10-CM | POA: Diagnosis present

## 2010-11-07 HISTORY — DX: Presence of aortocoronary bypass graft: Z95.1

## 2010-11-07 HISTORY — DX: Cerebral infarction due to unspecified occlusion or stenosis of right middle cerebral artery: I63.511

## 2010-11-07 HISTORY — DX: Essential (primary) hypertension: I10

## 2010-11-07 HISTORY — DX: Atherosclerotic heart disease of native coronary artery without angina pectoris: I25.10

## 2010-11-07 LAB — URINALYSIS, ROUTINE W REFLEX MICROSCOPIC
Protein, ur: NEGATIVE mg/dL
Specific Gravity, Urine: 1.025 (ref 1.005–1.030)
Urine Glucose, Fasting: NEGATIVE mg/dL
Urobilinogen, UA: 1 mg/dL (ref 0.0–1.0)

## 2010-11-07 LAB — URINE MICROSCOPIC-ADD ON

## 2010-11-07 LAB — CBC
HCT: 37.9 % (ref 36.0–46.0)
Hemoglobin: 13.8 g/dL (ref 12.0–15.0)
MCV: 86.9 fL (ref 78.0–100.0)
RBC: 4.36 MIL/uL (ref 3.87–5.11)
RDW: 12.7 % (ref 11.5–15.5)
WBC: 11.9 10*3/uL — ABNORMAL HIGH (ref 4.0–10.5)

## 2010-11-07 LAB — CK TOTAL AND CKMB (NOT AT ARMC)
CK, MB: 3.4 ng/mL (ref 0.3–4.0)
Relative Index: 1.9 (ref 0.0–2.5)
Total CK: 180 U/L — ABNORMAL HIGH (ref 7–177)

## 2010-11-07 LAB — DIFFERENTIAL
Basophils Absolute: 0 10*3/uL (ref 0.0–0.1)
Lymphocytes Relative: 8 % — ABNORMAL LOW (ref 12–46)
Lymphs Abs: 0.9 10*3/uL (ref 0.7–4.0)
Neutro Abs: 10.3 10*3/uL — ABNORMAL HIGH (ref 1.7–7.7)

## 2010-11-07 LAB — COMPREHENSIVE METABOLIC PANEL
Albumin: 3.8 g/dL (ref 3.5–5.2)
BUN: 12 mg/dL (ref 6–23)
Calcium: 9 mg/dL (ref 8.4–10.5)
Chloride: 106 mEq/L (ref 96–112)
Creatinine, Ser: 0.83 mg/dL (ref 0.4–1.2)
GFR calc Af Amer: >60 mL/min (ref >60)
Total Bilirubin: 0.7 mg/dL (ref 0.3–1.2)

## 2010-11-07 LAB — PROTIME-INR: INR: 1 (ref 0.00–1.49)

## 2010-11-07 LAB — APTT: aPTT: 27 seconds (ref 24–37)

## 2010-11-08 ENCOUNTER — Inpatient Hospital Stay (HOSPITAL_COMMUNITY): Payer: PRIVATE HEALTH INSURANCE

## 2010-11-08 DIAGNOSIS — Z9289 Personal history of other medical treatment: Secondary | ICD-10-CM

## 2010-11-08 DIAGNOSIS — I6789 Other cerebrovascular disease: Secondary | ICD-10-CM

## 2010-11-08 HISTORY — DX: Personal history of other medical treatment: Z92.89

## 2010-11-08 LAB — APTT: aPTT: 30 seconds (ref 24–37)

## 2010-11-08 LAB — COMPREHENSIVE METABOLIC PANEL
ALT: 17 U/L (ref 0–35)
AST: 16 U/L (ref 0–37)
Albumin: 3.7 g/dL (ref 3.5–5.2)
Calcium: 9.1 mg/dL (ref 8.4–10.5)
GFR calc Af Amer: >60 mL/min (ref >60)
Sodium: 143 mEq/L (ref 135–145)
Total Protein: 6.8 g/dL (ref 6.0–8.3)

## 2010-11-08 LAB — CBC
Hemoglobin: 12.5 g/dL (ref 12.0–15.0)
MCH: 30.4 pg (ref 26.0–34.0)
MCV: 88.3 fL (ref 78.0–100.0)
RBC: 4.11 MIL/uL (ref 3.87–5.11)

## 2010-11-08 LAB — CARDIAC PANEL(CRET KIN+CKTOT+MB+TROPI)
Relative Index: 2.1 (ref 0.0–2.5)
Total CK: 173 U/L (ref 7–177)
Troponin I: <0.01 ng/mL (ref 0.00–0.06)

## 2010-11-08 LAB — PROTIME-INR
INR: 1.01 (ref 0.00–1.49)
Prothrombin Time: 13.5 seconds (ref 11.6–15.2)

## 2010-11-08 LAB — LIPID PANEL
HDL: 40 mg/dL (ref >39)
Total CHOL/HDL Ratio: 3.7 RATIO

## 2010-11-08 LAB — URINALYSIS, ROUTINE W REFLEX MICROSCOPIC
Specific Gravity, Urine: >1.03 — ABNORMAL HIGH (ref 1.005–1.030)
Urobilinogen, UA: 0.2 mg/dL (ref 0.0–1.0)

## 2010-11-08 LAB — URINE CULTURE: Colony Count: NO GROWTH

## 2010-11-08 LAB — URINE MICROSCOPIC-ADD ON

## 2010-11-08 LAB — HEMOGLOBIN A1C: Hgb A1c MFr Bld: 5.5 % (ref <5.7)

## 2010-11-09 DIAGNOSIS — I6330 Cerebral infarction due to thrombosis of unspecified cerebral artery: Secondary | ICD-10-CM

## 2010-11-09 LAB — BASIC METABOLIC PANEL
BUN: 15 mg/dL (ref 6–23)
CO2: 27 mEq/L (ref 19–32)
Chloride: 106 mEq/L (ref 96–112)
Creatinine, Ser: 0.88 mg/dL (ref 0.4–1.2)
Glucose, Bld: 107 mg/dL — ABNORMAL HIGH (ref 70–99)

## 2010-11-09 LAB — CBC
Hemoglobin: 12 g/dL (ref 12.0–15.0)
MCH: 30.8 pg (ref 26.0–34.0)
MCV: 88.9 fL (ref 78.0–100.0)
Platelets: 206 10*3/uL (ref 150–400)
RBC: 3.89 MIL/uL (ref 3.87–5.11)

## 2010-11-10 ENCOUNTER — Inpatient Hospital Stay (HOSPITAL_COMMUNITY): Payer: PRIVATE HEALTH INSURANCE

## 2010-11-10 LAB — URINALYSIS, ROUTINE W REFLEX MICROSCOPIC
Nitrite: POSITIVE — AB
Specific Gravity, Urine: 1.024 (ref 1.005–1.030)
Urobilinogen, UA: 1 mg/dL (ref 0.0–1.0)

## 2010-11-10 LAB — BASIC METABOLIC PANEL
Calcium: 9.1 mg/dL (ref 8.4–10.5)
GFR calc Af Amer: >60 mL/min (ref >60)
GFR calc non Af Amer: >60 mL/min (ref >60)
Sodium: 142 mEq/L (ref 135–145)

## 2010-11-10 LAB — URINE MICROSCOPIC-ADD ON

## 2010-11-10 LAB — CBC
MCHC: 33.9 g/dL (ref 30.0–36.0)
Platelets: 202 10*3/uL (ref 150–400)
RDW: 12.6 % (ref 11.5–15.5)
WBC: 8.6 10*3/uL (ref 4.0–10.5)

## 2010-11-11 ENCOUNTER — Inpatient Hospital Stay (HOSPITAL_COMMUNITY): Payer: PRIVATE HEALTH INSURANCE

## 2010-11-11 LAB — URINE CULTURE
Colony Count: 3000
Culture  Setup Time: 201203031907

## 2010-11-11 NOTE — H&P (Signed)
[name], [name]                ACCOUNT NO.:  [id number]  MEDICAL RECORD NO.:  [id number]           PATIENT TYPE:  E  LOCATION:  [facility]                         FACILITY:  [facility]  PHYSICIAN:  Michiel Cowboy, MDDATE OF BIRTH:  Jul 20, 1956  DATE OF ADMISSION:  11/07/2010 DATE OF DISCHARGE:                             HISTORY & PHYSICAL   ATTENDING PHYSICIAN:  Michiel Cowboy, MD  PRIMARY CARE PROVIDER:  Neta Mends. Panosh, MD  CHIEF COMPLAINT:  The patient was found down.  The patient is a 55 year old female with history of coronary artery disease status post CABG and status post recent right heart catheterization done on October 30, 2010.  Yesterday, she went to bed at 10:00 p.m. and was not heard from until around 4:00 p.m. when she was found by her son laying down on the floor.  Her family thought that she actually got up in the morning, went to work, but she apparently never did.  She stated she woke up at some point and felt very nauseous, tried to get to the bathroom and could not and was trying to crawl to the bathroom, but she never called out for help.  She could not tell me what time that she actually woke up.  She was brought into the emergency department.  She was not considered code stroke given the fact that she last was seen normal at 10:00 p.m. last day.  Otherwise, the patient that did not endorse any chest pain.  She did report some headache and nausea which is currently improving.  She has trouble moving her left hand but able to lift her left leg off the bed.  CT scan of the head was done in the emergency department and showed right MCA stroke.  REVIEW OF SYSTEMS:  Otherwise unremarkable except for HPI.  PAST MEDICAL HISTORY: 1. History of coronary artery disease status post CABG. 2. High cholesterol. 3. Hypertension. 4. Sleep apnea. 5. Obesity.  SOCIAL HISTORY:  The patient is a former smoker, quit 4 years ago, does not drink or abuse drugs.   Lives at home with her elderly mother.  FAMILY HISTORY:  Significant for father who died of a heart attack in his early 40s.  ALLERGIES:  The patient is allergic to AMOXICILLIN, CEPHALOSPORINS, CODEINE, PENICILLIN, and SULFA.  MEDICATIONS: 1. Potassium 10 mEq three times a day. 2. Cyclobenzaprine 10 mg at bedtime. 3. Metoprolol 12.5 mg twice daily. 4. Levothyroxine 137 mcg daily. 5. Lasix, recently started, 20 mg daily. 6. Temazepam 1-2 tablets 15 mg as needed at bedtime. 7. Paroxetine 40 mg daily. 8. Nuvigil as needed 150 mg for excessive sleepiness. 9. Bupropion 150 mg every other day.  PHYSICAL EXAMINATION:  VITAL SIGNS:  Temperature 98.4, blood pressure 145/85, pulse 86, respirations 20, satting 94% on room air, so on 2 liters the patient appears to be in no acute distress. HEENT:  Head:  Nontraumatic.  Moist mucous membranes. LUNGS:  Clear to auscultation bilaterally. HEART:  Regular rate and rhythm.  No murmurs appreciated. ABDOMEN:  Obese but nontender, nondistended. EXTREMITIES:  Lower extremities without clubbing, cyanosis, or edema.  NEUROLOGIC:  Strength appears to be intact in both lower extremities and densely hemiparesis of left upper extremity.  There also appears to be left facial droop.  This patient is having somewhat slurred speech.  He is not able to follow my fingers with her gaze, I am not sure if she is not cooperating or if this is evidence of neurological dysfunction.  LABORATORY DATA:  White blood cell count 11.9, hemoglobin is 13.8. Sodium 140, potassium 4.0, creatinine 0.83, glucose 123.  INR 1.0.  LFTs within normal limits.  Cardiac enzymes, slightly elevated total CK at 180, but otherwise unremarkable.  UA is unremarkable.  CT scan of the head showing acute right MCA stroke. EKG unchanged from prior showing normal sinus rhythm.  ASSESSMENT AND PLAN:  This is a 55 year old female with history of coronary artery disease now with new acute  cerebrovascular accident. 1. Cerebrovascular accident.  We will admit to 3000 telemetry.  The     patient is already ordered MRI/MRA, is going to get it now.  In the     morning, we will get 2-D echo, carotid Dopplers.  Neurology is     aware, I am not sure at this point if able to consult but     requesting the emergency physician to request this. 2. History of coronary artery disease.  Right now, we will cycle     cardiac markers.  Currently chest pain free.  Continue metoprolol     if able to pass swallow eval.  We will make sure she is on full-     dose aspirin. 3. History of sleep apnea.  We will continue continuous positive     airway pressure. 4. Prophylaxis.  We will write for Protonix if able to take and SCDs.     Michiel Cowboy, MD     AVD/MEDQ  D:  11/07/2010  T:  11/07/2010  Job:  [id number]  cc:   Neta Mends. Fabian Sharp, MD  Electronically Signed by Therisa Doyne MD on 11/11/2010 10:13:45 PM

## 2010-11-12 ENCOUNTER — Inpatient Hospital Stay (HOSPITAL_COMMUNITY): Payer: PRIVATE HEALTH INSURANCE

## 2010-11-12 LAB — BASIC METABOLIC PANEL WITH GFR
BUN: 18 mg/dL (ref 6–23)
CO2: 29 meq/L (ref 19–32)
Calcium: 9.1 mg/dL (ref 8.4–10.5)
Chloride: 103 meq/L (ref 96–112)
Creatinine, Ser: 0.85 mg/dL (ref 0.4–1.2)
GFR calc non Af Amer: >60 mL/min
Glucose, Bld: 142 mg/dL — ABNORMAL HIGH (ref 70–99)
Potassium: 3.1 meq/L — ABNORMAL LOW (ref 3.5–5.1)
Sodium: 140 meq/L (ref 135–145)

## 2010-11-12 LAB — CBC
HCT: 36.8 % (ref 36.0–46.0)
Hemoglobin: 12.8 g/dL (ref 12.0–15.0)
MCH: 30.5 pg (ref 26.0–34.0)
MCHC: 34.8 g/dL (ref 30.0–36.0)
MCV: 87.6 fL (ref 78.0–100.0)
Platelets: 241 K/uL (ref 150–400)
RBC: 4.2 MIL/uL (ref 3.87–5.11)
RDW: 12.7 % (ref 11.5–15.5)
WBC: 8.7 K/uL (ref 4.0–10.5)

## 2010-11-13 ENCOUNTER — Inpatient Hospital Stay (HOSPITAL_COMMUNITY)
Admission: RE | Admit: 2010-11-13 | Discharge: 2010-12-04 | DRG: 945 | Disposition: A | Discharge status: Skilled Nursing Facility | Payer: PRIVATE HEALTH INSURANCE | Source: Other Acute Inpatient Hospital | Attending: Physical Medicine & Rehabilitation | Admitting: Physical Medicine & Rehabilitation

## 2010-11-13 ENCOUNTER — Inpatient Hospital Stay (HOSPITAL_COMMUNITY): Payer: PRIVATE HEALTH INSURANCE

## 2010-11-13 DIAGNOSIS — I2510 Atherosclerotic heart disease of native coronary artery without angina pectoris: Secondary | ICD-10-CM

## 2010-11-13 DIAGNOSIS — E876 Hypokalemia: Secondary | ICD-10-CM

## 2010-11-13 DIAGNOSIS — I498 Other specified cardiac arrhythmias: Secondary | ICD-10-CM

## 2010-11-13 DIAGNOSIS — R339 Retention of urine, unspecified: Secondary | ICD-10-CM

## 2010-11-13 DIAGNOSIS — Z5189 Encounter for other specified aftercare: Principal | ICD-10-CM

## 2010-11-13 DIAGNOSIS — G4733 Obstructive sleep apnea (adult) (pediatric): Secondary | ICD-10-CM

## 2010-11-13 DIAGNOSIS — Z7902 Long term (current) use of antithrombotics/antiplatelets: Secondary | ICD-10-CM

## 2010-11-13 DIAGNOSIS — J438 Other emphysema: Secondary | ICD-10-CM

## 2010-11-13 DIAGNOSIS — I10 Essential (primary) hypertension: Secondary | ICD-10-CM

## 2010-11-13 DIAGNOSIS — K5900 Constipation, unspecified: Secondary | ICD-10-CM

## 2010-11-13 DIAGNOSIS — Z8249 Family history of ischemic heart disease and other diseases of the circulatory system: Secondary | ICD-10-CM

## 2010-11-13 DIAGNOSIS — Z7982 Long term (current) use of aspirin: Secondary | ICD-10-CM

## 2010-11-13 DIAGNOSIS — I6350 Cerebral infarction due to unspecified occlusion or stenosis of unspecified cerebral artery: Secondary | ICD-10-CM

## 2010-11-13 DIAGNOSIS — R51 Headache: Secondary | ICD-10-CM

## 2010-11-13 DIAGNOSIS — G8929 Other chronic pain: Secondary | ICD-10-CM

## 2010-11-13 DIAGNOSIS — Z6379 Other stressful life events affecting family and household: Secondary | ICD-10-CM

## 2010-11-13 DIAGNOSIS — Z882 Allergy status to sulfonamides status: Secondary | ICD-10-CM

## 2010-11-13 DIAGNOSIS — R1310 Dysphagia, unspecified: Secondary | ICD-10-CM

## 2010-11-13 DIAGNOSIS — I6330 Cerebral infarction due to thrombosis of unspecified cerebral artery: Secondary | ICD-10-CM

## 2010-11-13 DIAGNOSIS — G8190 Hemiplegia, unspecified affecting unspecified side: Secondary | ICD-10-CM

## 2010-11-13 DIAGNOSIS — Z881 Allergy status to other antibiotic agents status: Secondary | ICD-10-CM

## 2010-11-13 DIAGNOSIS — F3289 Other specified depressive episodes: Secondary | ICD-10-CM

## 2010-11-13 DIAGNOSIS — F329 Major depressive disorder, single episode, unspecified: Secondary | ICD-10-CM

## 2010-11-13 DIAGNOSIS — Z951 Presence of aortocoronary bypass graft: Secondary | ICD-10-CM

## 2010-11-13 DIAGNOSIS — Z87891 Personal history of nicotine dependence: Secondary | ICD-10-CM

## 2010-11-13 DIAGNOSIS — E039 Hypothyroidism, unspecified: Secondary | ICD-10-CM

## 2010-11-13 DIAGNOSIS — Z6838 Body mass index (BMI) 38.0-38.9, adult: Secondary | ICD-10-CM

## 2010-11-13 DIAGNOSIS — G4710 Hypersomnia, unspecified: Secondary | ICD-10-CM

## 2010-11-14 DIAGNOSIS — I6330 Cerebral infarction due to thrombosis of unspecified cerebral artery: Secondary | ICD-10-CM

## 2010-11-14 DIAGNOSIS — I69991 Dysphagia following unspecified cerebrovascular disease: Secondary | ICD-10-CM

## 2010-11-14 DIAGNOSIS — G8110 Spastic hemiplegia affecting unspecified side: Secondary | ICD-10-CM

## 2010-11-14 LAB — COMPREHENSIVE METABOLIC PANEL
AST: 19 U/L (ref 0–37)
Albumin: 3.3 g/dL — ABNORMAL LOW (ref 3.5–5.2)
Alkaline Phosphatase: 68 U/L (ref 39–117)
Chloride: 108 mEq/L (ref 96–112)
GFR calc Af Amer: >60 mL/min (ref >60)
Potassium: 4.4 mEq/L (ref 3.5–5.1)
Total Bilirubin: 0.7 mg/dL (ref 0.3–1.2)

## 2010-11-14 LAB — CBC
Platelets: 237 10*3/uL (ref 150–400)
RBC: 4.14 MIL/uL (ref 3.87–5.11)
WBC: 6.5 10*3/uL (ref 4.0–10.5)

## 2010-11-14 LAB — DIFFERENTIAL
Basophils Relative: 1 % (ref 0–1)
Eosinophils Absolute: 0.3 10*3/uL (ref 0.0–0.7)
Neutrophils Relative %: 69 % (ref 43–77)

## 2010-11-15 ENCOUNTER — Encounter: Payer: Self-pay | Admitting: Internal Medicine

## 2010-11-15 ENCOUNTER — Inpatient Hospital Stay (HOSPITAL_COMMUNITY): Payer: PRIVATE HEALTH INSURANCE

## 2010-11-16 ENCOUNTER — Inpatient Hospital Stay (HOSPITAL_COMMUNITY): Payer: PRIVATE HEALTH INSURANCE

## 2010-11-16 ENCOUNTER — Encounter: Payer: Self-pay | Admitting: Cardiology

## 2010-11-16 LAB — BASIC METABOLIC PANEL
BUN: 12 mg/dL (ref 6–23)
CO2: 27 mEq/L (ref 19–32)
Calcium: 9 mg/dL (ref 8.4–10.5)
Creatinine, Ser: 0.88 mg/dL (ref 0.4–1.2)
GFR calc non Af Amer: >60 mL/min (ref >60)
Glucose, Bld: 102 mg/dL — ABNORMAL HIGH (ref 70–99)
Sodium: 136 mEq/L (ref 135–145)

## 2010-11-20 ENCOUNTER — Inpatient Hospital Stay (HOSPITAL_COMMUNITY): Payer: PRIVATE HEALTH INSURANCE

## 2010-11-20 DIAGNOSIS — I69991 Dysphagia following unspecified cerebrovascular disease: Secondary | ICD-10-CM

## 2010-11-20 DIAGNOSIS — I6330 Cerebral infarction due to thrombosis of unspecified cerebral artery: Secondary | ICD-10-CM

## 2010-11-20 DIAGNOSIS — G8110 Spastic hemiplegia affecting unspecified side: Secondary | ICD-10-CM

## 2010-11-21 DIAGNOSIS — I6350 Cerebral infarction due to unspecified occlusion or stenosis of unspecified cerebral artery: Secondary | ICD-10-CM

## 2010-11-21 DIAGNOSIS — F4322 Adjustment disorder with anxiety: Secondary | ICD-10-CM

## 2010-11-21 LAB — BASIC METABOLIC PANEL
BUN: 10 mg/dL (ref 6–23)
Calcium: 9.4 mg/dL (ref 8.4–10.5)
Creatinine, Ser: 0.98 mg/dL (ref 0.4–1.2)
GFR calc non Af Amer: 59 mL/min — ABNORMAL LOW (ref >60)
Glucose, Bld: 99 mg/dL (ref 70–99)
Potassium: 3.8 mEq/L (ref 3.5–5.1)

## 2010-11-21 NOTE — Discharge Summary (Signed)
[name], [name]                ACCOUNT NO.:  [id number]  MEDICAL RECORD NO.:  [id number]           PATIENT TYPE:  I  LOCATION:  3012                         FACILITY:  [facility]  PHYSICIAN:  Thad Ranger, MD       DATE OF BIRTH:  August 11, 1956  DATE OF ADMISSION:  11/07/2010 DATE OF DISCHARGE:  11/13/2010                              DISCHARGE SUMMARY   DISCHARGE DIAGNOSES: 1. 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 2. Right middle cerebral artery thrombus. 3. Residual dysphagia from the acute cerebrovascular accident on     dysphagia I diet. 4. Hypokalemia. 5. History of coronary artery disease status post CABG and recent     heart catheterization in February 2012. 6. Hypertension. 7. Hyperlipidemia. 8. Obesity. 9. Obstructive sleep apnea. 10.Hypothyroidism. 11.History of depression.  CONSULTATIONS:  Neurology, cardiology, Dr. Gala Romney.  DISCHARGE MEDICATIONS: 1. Metoprolol increased to 25 mg p.o. b.i.d.2. Plavix 75 mg p.o. daily. 3. Crestor 20 mg p.o. daily. 4. Cyclobenzaprine 10 mg p.o. daily at bedtime as needed for muscle     spasms. 5. Fluoxetine 80 mg p.o. b.i.d. 6. Levothroid 137 mcg p.o. q.a.m. 7. Nuvigil  150 mg p.o. q.a.m. 8. Potassium 10 mEq 2 q.a.m., 20 mEq p.o. at bedtime. 9. Prilosec 10 mg p.o. daily. 10.Temazepam 7.5 mg p.o. daily at bedtime as needed for sleep. 11.Tramadol 50 mg p.o. t.i.d. 12.Wellbutrin 75 mg p.o. q.a.m.  BRIEF HISTORY OF PRESENT ILLNESS AT THE TIME OF ADMISSION:  [name] is a 55 year old female with multiple medical problems with a history of coronary disease status post CABG, status post recent right heart catheterization on October 30, 2010.  She went to bed around 10:00 p.m. prior to the admission and was not heard until 4:00 p.m. when she was found by her son lying down on the floor.  Her family thought that she actually got up in the morning, went to work, but she apparently never did.  The patient  stated that she woke up at some point, felt very nauseous, tried to go to the bathroom and could not and hence was trying to crawl to the bathroom but she never called out for help.  The patient was brought to the emergency department.  She was not considered code stroke given that she was seen normal around 10:00 p.m. a day before. CT scan of the head done in the emergency room did show right MCA stroke.  RADIOLOGICAL DATA:  CT head without contrast on February 29 showed acute right MCA distribution infarct.  MRI of the head March 1 showed moderate size right MCA acute infarctions, cytotoxic edema without associated hemorrhage or significant mass effect, evidence of occlusive right MCA thrombus, bilateral mastoid air cell inflammatory changes likely __________ effusion.  MRA 1, right MCA occlusion, 5-mm beyond the region, negative intracranial MRA.  Chest x-ray 2-view March 1 low lung volumes with resulting bibasilar atelectasis.  No airspace disease or acute findings.  Chest x-ray 2-view March 3, right hilar fullness and right suprahilar opacity stable.  This is new since October 03, 2010. Pneumonia would be a consideration.  Abdominal x-ray  March 3, 4th and 5th were in context of the panda tube.  March 4, successful transpyloric placement of 10-French feeding tube.  A 2-D echocardiogram/TEE March 2 showed EF of 50-65% normal wall motion.  No avid dense of agitation on mitral valve.  The atrial septum agitated saline contrast study showed trivial shunt with Valsalva.  Carotid Doppler's on November 09, 2010 showed no evidence of significant ICA stenosis.  BRIEF HOSPITALIZATION COURSE:  [name] is a 55 year old female with history of coronary artery disease status post CABG who presented with unknown time of onset of right MCA and CVA. 1. Acute right MCA infarct.  The patient was admitted to the neurology     floor and underwent the stroke workup.  CT head and MRI confirmed     right  MCA infarct and MRA showed right middle cerebral artery     occlusion.  The patient had presented beyond the time window for     aggressive intervention for t-PA.  She underwent a TEE and carotid     Doppler's which were essentially negative.  The patient had     significant residual dysphagia secondary to acute CVA.  Multiple     attempts of panda tube placements were done.  The patient, however,     successfully passed the swallow evaluation on November 12, 2010.  The     patient will continue dysphagia I diet with honey thick liquids via     teaspoon and meds to be crushed.  There was a question of Plavix in     this patient and she was receiving rectal aspirin all the time due     to dysphagia.  At the time of the discharge to inpatient rehab, I     discussed in detail with Dr. Thana Farr from Neurology     Service.  Given that the patient has a history of coronary artery     disease as well and was on aspirin, her recommendation was to stop     the Plavix and discontinue the aspirin.  The patient was also     closely followed by inpatient rehab and was accepted to the     inpatient rehab on November 13, 2010. 2. Hypokalemia.  This was replaced. 3. Hypertension.  Blood pressure actually was on softer side.  Hence     the patient's home dose of 100 mg metoprolol was decreased to 25 mg     p.o. b.i.d.  It can be titrated up per her BP readings. 4. History of obstructive sleep apnea.  The patient is also on CPAP at     home settings. 5. Depression.  The patient does have a significant depression and     since she has passed her swallow test, she will be started on     Wellbutrin and Prozac.  DISCHARGE PHYSICAL EXAMINATION:  VITAL SIGNS:  At the time of discharge to the inpatient rehab today:  Temperature 97.5, pulse 50, respirations 18, blood pressure 144/93, O2 sats 94%. GENERAL:  The patient is on CPAP this morning and resting. CVS:  S1, S2 clear. CHEST:  Clear anteriorly. ABDOMEN:   Soft, obese. EXTREMITIES:  No cyanosis, clubbing, or edema. NEUROLOGIC:  The patient is resting and did not cooperate with exam. However, per the examinations every day she has 0/5 strength on the left upper extremity with facial droop and dysarthria.  DISPOSITION:  The patient will be discharged to inpatient rehab today.  DISCHARGE TIME: DICTATION ENDS  AT THIS POINT.     Thad Ranger, MD     RR/MEDQ  D:  11/13/2010  T:  11/13/2010  Job:  [id number]  cc:   Bevelyn Buckles. Bensimhon, MD Neta Mends. Panosh, MD Pramod P. Pearlean Brownie, MD  Electronically Signed by Andres Labrum RAI  on 11/21/2010 07:51:48 AM

## 2010-11-23 DIAGNOSIS — I69991 Dysphagia following unspecified cerebrovascular disease: Secondary | ICD-10-CM

## 2010-11-23 DIAGNOSIS — I6330 Cerebral infarction due to thrombosis of unspecified cerebral artery: Secondary | ICD-10-CM

## 2010-11-23 DIAGNOSIS — G8110 Spastic hemiplegia affecting unspecified side: Secondary | ICD-10-CM

## 2010-11-23 NOTE — H&P (Signed)
[name], WILLETTS NO.:  [id number]  MEDICAL RECORD NO.:  [id number]           PATIENT TYPE:  I  LOCATION:  4034                         FACILITY:  [facility]  PHYSICIAN:  Ranelle Oyster, M.D.DATE OF BIRTH:  [birth year]  DATE OF ADMISSION:  11/13/2010 DATE OF DISCHARGE:                             HISTORY & PHYSICAL   CHIEF COMPLAINT:  Weakness and chest pain, which is chronic.  CARDIOLOGIST:  Marca Ancona, MD.  NEUROLOGIST:  Pramod P. Pearlean Brownie, MD.  HISTORY OF PRESENT ILLNESS:  This is a 55 year old white female with history of CAD, obstructive sleep apnea with her recent heart catheterization of February 12 revealing elevated left and right sling pressures.  She was admitted on February 29 with a fall and was found on the floor by son.  She had left-sided weakness and CT showed right MCA infarct.  MRI of the brain showed the same with cytotoxic edema, evidence of occlusive right MCA thrombus, right MCA occlusion 5 mm beyond the origin.  TE was done, ejection fraction of 60-65%, borderline atrial septal aneurysm.  No wall abnormalities, otherwise.  No thrombus was seen.  Carotid Dopplers were done showing no ICA stenosis.  She was placed on Plavix for stroke prophylaxis.  The patient with dysphagia and Panda tube was placed initially.  MBS was done March 5 after she awoke and was more alert and showed severe delay in oral transit and penetration of liquids.  She was able to tolerate a honey D1 thick liquid diet which was started.  The patient has problem with decreased awareness and attention impacting her overall picture.  Rehab has been following along for inpatient rehab admission and felt she could benefit from an inpatient stay.  REVIEW OF SYSTEMS:  Notable for chronic chest pain, right knee pain, weakness, fatigue.  She has had a Foley catheter taken out today.  She has been constipated as well.  Full 12-point review is in the written H and  P.  PAST MEDICAL HISTORY:  Positive for CAD with CABG in 2007 with wound dehiscence and sternal infection with I and D and chronic instability in the chest with chronic pain.  She had hernia repair in 1991, hypothyroidism, hypertension, bilateral knee scopes, T and A in 1963, emphysema per recent CT of the chest, obstructive sleep apnea with hypersomnia, bladder sling, and mastoidectomy in 1998.  FAMILY HISTORY:  Positive CAD and substance abuse.  SOCIAL HISTORY:  The patient lives with her mother, 1-level house, 5 to 7 steps to enter.  Quit smoking in 2008 and occasionally drinks.  ALLERGIES:  SULFA, AVELOX, AMOXICILLIN, VICODIN, CEPHALOSPORINS.  HOME MEDICATIONS:  Prilosec, levothyroxine, Prozac, Wellbutrin, Restoril, metoprolol, Flexeril, Crestor, Ultram, aspirin, KCl.  LABORATORY DATA:  Hemoglobin 12.8, white count 8.7, platelets 241. Sodium 140, potassium 3.1, BUN 18, creatinine 0.85.  PHYSICAL EXAMINATION:  VITAL SIGNS:  Blood pressure is 144/93, pulse 50, respiratory rate 18, temperature 97.5. GENERAL:  The patient is obese, lying in bed, in no acute distress.  She has been lethargic. HEENT:  Pupils equally round and reactive to light.  Ears, nose, and throat exam is  notable for borderline dentition and dry to borderline mucosa. NECK:  Supple without JVD or lymphadenopathy.  There is some pain in the anterior neck with palpation. CHEST:  Notable for occasional rhonchi. HEART:  Regular rate and rhythm with occasional PAC.  She had pain with palpation over the midsternum and a sternal defect noted. ABDOMEN:  Notable for intact bowel sounds and obese abdomen. SKIN:  Notable for chronic changes in wounds at the chest. NEUROLOGIC:  Cranial nerves II-XII notable for left central VII tongue deviation.  She is in grossly intact visual fields and extraocular eye movement.  Does have significant inattention to the left.  Sensation is diminished throughout the left hand and left  leg and arm to pinprick and light touch.  She can still discern the two.  Proprioception is decreased in left.  Judgment was a bit inhibited with poor insight and awareness.  Orientation was generally intact.  Memory was fair to good. Mood was flat to depressed.  Strength in the left upper extremity is trace at the deltoid, biceps, and triceps.  A 0 to 5 with hand intrinsic to the left.  Left lower extremity strength is 1/5 left hip, 1 to 2 left knee, 1/5 left ankle dorsiflexors, plantar flexors.  Right lower extremity, she is 2 to 3 out 5 at the hip, 3 to 4 out of 5 in the knee, 4/5 in the ankle plantar flexion and dorsiflexion.  POST ADMISSION PHYSICIAN EVALUATION: 1. Functional deficits secondary to right MCA infarct with dense left     hemiparesis, left facial droop, dysphagia, left inattention, and     cognitive deficits. 2. The patient is admitted to receive collaborative interdisciplinary     care between the physiatrist, rehab nursing staff, and therapy     team. 3. The patient's level of medical complexity and substantial therapy     needs in context of that medical necessity cannot be provided with     lesser intensity of care.4. The patient has experienced substantial functional loss from her     baseline.  Premorbidly, she is independent and working full time.     Currently, she is max assist dressing lower body, min assist     grooming.  She is total assist 60% for bed mobility, +2 total     assist, 70% for weight shifting and motor planning.  Judging by the     patient's diagnosis, physical exam, functional history, she has     potential for functional progress which will result in measurable     gains while on inpatient rehab.  These gains will be of substantial     and practical use upon discharge to home in facilitating mobility     and self-care.  Interim changes in the medical status since our     rehab consult are detailed above. 5. The physiatrist will provide  24-hour management of the medical     needs as well as oversight of therapy plan/treatment and provide     guidance as appropriate regarding interaction of the two.  Medical     problem list and plan are listed below. 6. Twenty four-hour rehab nursing will assist in management of the     patient's skin care needs as well as bowel and bladder function,     integration of therapy concept, techniques. 7. PT will assess and treat for lower extremity strength,     neuromuscular reeducation, visual perceptual and visual-spatial     training, adaptive techniques, equipment  goals, modified     independent to occasional min assist. 8. OT will assess and treat for upper extremity use, ADLs, adaptive     techniques, equipment, neuromuscular education, strengthening of     left upper extremity, cognitive perceptual and visual perceptual     training with goals modified independent to min assist. 9. Speech Language Pathology will follow for cognitive deficits as     well as swallowing with goals modified independent with swallowing     to supervision with cognition. 10.Case management/social worker will assess and treat for     psychosocial issues and discharge planning. 11.Team conferences will be held weekly to assess progress towards     goals and to determine barriers at discharge. 12.The patient has demonstrated sufficient medical stability, exercise     capacity to tolerate at least 3 hours of therapy per day at least 5     days per week. 13.Estimated length of stay is 3 to 4 weeks.  Prognosis is good.  MEDICAL PROBLEM LIST AND PLAN: 1. DVT prophylaxis with subcu Lovenox.  Follow for any bleeding     complications or any signs or symptoms of DVT. 2. Pain management p.r.n. Ultram.  Need to also support the left upper     and lower extremity and actively and passively range as tolerated. 3. Lethargy:  Resume Provigil to help activation and follow     clinically.  Optimize sleep and diet. 4.  Obstructive sleep apnea:  CPAP at bedtime. 5. Hypokalemia:  Somewhat delusional.  Resume supplementation and     followup admission laboratory values. 6. Honey thick liquids for dysphagia:  Continue honey thick liquids at     D1 diet.  Consider IV fluid at bedtime to avoid dehydration,     although her sister states that she had been drinking vigorously. 7. GU:  Foley has been discharge.  We will check PVRs and toilet the     patient q.2-3 h. for improved continence.     Ranelle Oyster, M.D.     ZTS/MEDQ  D:  11/13/2010  T:  11/14/2010  Job:  [id number]  Electronically Signed by Faith Rogue M.D. on 11/23/2010 04:16:12 PM

## 2010-11-23 NOTE — Consult Note (Signed)
[name], [name]                ACCOUNT NO.:  [id number]  MEDICAL RECORD NO.:  [id number]           PATIENT TYPE:  I  LOCATION:  3012                         FACILITY:  [facility]  PHYSICIAN:  Reshanda Lewey P. Pearlean Brownie, MD    DATE OF BIRTH:  1955/11/19  DATE OF CONSULTATION: DATE OF DISCHARGE:                                CONSULTATION   REFERRING PHYSICIAN:  [facility].  REASON FOR REFERRAL:  Stroke.  HISTORY OF PRESENT ILLNESS:  [name] is a 55 year old Caucasian lady who was unclear as to when she was last seen normal.  She went to sleep on Tuesday at 10 p.m.  She woke up next day but did not show up for work.  Her son returned at 4 p.m. and found her on the floor next to the bed.  She was complaining of nausea and throwing up.  She also had left- sided weakness.  The patient, when asked, is unable to tell us clearly as to when she noticed left-sided weakness whether she had been lying on the floor in the morning or she woke up from sleep with her complaints. Hence, the patient was not a candidate for aggressive intervention because the time of onset was not clear.  Her NIH stroke scale on admission was 5 and a baseline modified Rankin scale was 0.  Her symptoms have remained unchanged since admission without significant improvement.  PAST MEDICAL HISTORY:  Hypertension, hyperlipidemia, obesity, obstructive sleep apnea, coronary artery disease.  HOME MEDICATIONS:  Potassium, Flexeril, metoprolol, levothyroxine, Lasix, temazepam, Paxil, Nuvigil, Wellbutrin.  MEDICATION ALLERGIES:  AMOXICILLIN, CEPHALOSPORINS, CODEINE, PENICILLIN, and SULFA.  FAMILY HISTORY:  Noncontributory.  SOCIAL HISTORY:  The patient lives her mother who is elder and is not in good health.  The patient quit smoking 4 years ago.  She does not drink alcohol.  She does not do drugs.  REVIEW OF SYSTEMS:  Negative for any recent fever, cough, chest pain, diarrhea, or other illness.  PHYSICAL  EXAMINATION:  GENERAL:  Reveals obese middle-aged Caucasian lady who is currently not in distress. VITAL SIGNS:  Temperature 98.2, blood pressure 132/82, heart rate 74 per minute and regular, respiratory rate 19 per minute, distal pulses well felt. HEENT:  Head is nontraumatic. NECK:  Supple.  There is no bruit.  Hearing is normal. CARDIAC:  No murmur, rub, or gallop. LUNGS:  Clear to auscultation. NEUROLOGIC:  The patient is drowsy but arouses easily, opens eyes, follows commands.  She has right gaze preference, but is able to cross the midline and look to the left.  She does not blink to threat on the left, but does so on the right.  There is moderate left lower facial weakness.  Tongue is midline. MUSCULOSKELETAL:  Motor system exam reveals dense left upper extremity flaccid  and weakness with minimal withdrawal to painful stimuli.  She has good left lower extremity strength and has very minimum drift.  She had decreased touch and pinprick sensation on the left hemibody. Coordination is impaired in the left upper extremities, but not in the lower extremities.  Gait was not tested.  DATA REVIEWED:  MRI scan of the brain showed right frontal, middle cerebral artery branch infarct including the insula cortex.  MRA shows abrupt occlusion of the terminal right middle cerebral artery in the distal M1 portion.  Total cholesterol, LDL, HDL, triglycerides are normal.  Hemoglobin A1c is pending.  UA is negative.  Electrolytes are normal.  IMPRESSION:  A 55-year lady with unknown time of onset of right middle cerebral artery branch infarct and the right middle cerebral artery occlusion.  She has presented beyond time window for aggressive intervention.  She has multiple vascular risk factors of hypertension, hyperlipidemia, obesity, sleep apnea, coronary artery disease.  PLAN:  Check carotid Dopplers, 2D echo, and transesophageal echocardiogram.  Consider Plavix when she is able to  swallow, till then use rectal aspirin.[name]  Physical, Speech and Occupation therapy consults, Rehab consults.  I had long discussion with the patient and her sister and son regarding the stroke prognosis, plan for evaluation and treatment, answered questions.  I will be happy to follow the patient in consult.     Travers Goodley P. Pearlean Brownie, MD     PPS/MEDQ  D:  11/08/2010  T:  11/09/2010  Job:  [id number]  Electronically Signed by Delia Heady MD on 11/23/2010 11:16:57 AM

## 2010-11-26 DIAGNOSIS — I69991 Dysphagia following unspecified cerebrovascular disease: Secondary | ICD-10-CM

## 2010-11-26 DIAGNOSIS — I6330 Cerebral infarction due to thrombosis of unspecified cerebral artery: Secondary | ICD-10-CM

## 2010-11-26 DIAGNOSIS — G8110 Spastic hemiplegia affecting unspecified side: Secondary | ICD-10-CM

## 2010-11-27 LAB — BASIC METABOLIC PANEL
Calcium: 9.4 mg/dL (ref 8.4–10.5)
GFR calc non Af Amer: >60 mL/min (ref >60)
Glucose, Bld: 98 mg/dL (ref 70–99)
Potassium: 4.2 mEq/L (ref 3.5–5.1)
Sodium: 133 mEq/L — ABNORMAL LOW (ref 135–145)

## 2010-11-27 LAB — CBC
HCT: 34.9 % — ABNORMAL LOW (ref 36.0–46.0)
MCHC: 34.4 g/dL (ref 30.0–36.0)
Platelets: 206 10*3/uL (ref 150–400)
RDW: 12.8 % (ref 11.5–15.5)
WBC: 4.4 10*3/uL (ref 4.0–10.5)

## 2010-11-27 LAB — TSH: TSH: 2.447 u[IU]/mL (ref 0.350–4.500)

## 2010-12-01 LAB — BASIC METABOLIC PANEL
BUN: 8 mg/dL (ref 6–23)
Calcium: 9.4 mg/dL (ref 8.4–10.5)
GFR calc non Af Amer: >60 mL/min (ref >60)
Glucose, Bld: 95 mg/dL (ref 70–99)
Sodium: 139 mEq/L (ref 135–145)

## 2010-12-04 DIAGNOSIS — I6330 Cerebral infarction due to thrombosis of unspecified cerebral artery: Secondary | ICD-10-CM

## 2010-12-04 DIAGNOSIS — G8110 Spastic hemiplegia affecting unspecified side: Secondary | ICD-10-CM

## 2010-12-04 DIAGNOSIS — I69991 Dysphagia following unspecified cerebrovascular disease: Secondary | ICD-10-CM

## 2010-12-06 NOTE — Consult Note (Signed)
NAMEDAJUANA, PALEN NO.:  [id number]  MEDICAL RECORD NO.:  [id number]           PATIENT TYPE:  I  LOCATION:  3012                         FACILITY:  [facility]  PHYSICIAN:  Bevelyn Buckles. Bensimhon, MDDATE OF BIRTH:  May 01, 1956  DATE OF CONSULTATION:  11/08/2010 DATE OF DISCHARGE:                                CONSULTATION   REQUESTING PROVIDER:  Stroke Service.  REASON FOR CONSULT:  Acute stroke.  Ms. Landers is a 55 year old woman with a history of hypertension, hyperlipidemia, obesity, COPD, and coronary artery disease status post bypass surgery in 2010.  She has been followed by Dr. Riley Kill.  She underwent left heart catheterization on September 28, 2010, which showed patent revascularization.  This was uncomplicated.  She had a followup right heart cath on October 30, 2010, by Dr. Shirlee Latch via the right internal jugular approach to further evaluate dyspnea.  This too was relatively unremarkable.  She did well after both procedures without any problems.  Yesterday, she was found down at home and brought to the ER.  She had evidence of a stroke on exam.  MRI showed total occlusion of the right middle cerebral artery.  She currently has a left-sided facial droop and her left upper extremity is plegic.  She can move her left leg some. She denies any chest pain, shortness of breath, or palpitations.  There is no known history of atrial fibrillation.  Her current EKG and telemetry shows sinus rhythm.  She is pending a TEE for tomorrow.  REVIEW OF SYSTEMS:  Hard to obtain due to her dysarthria.  She does have a headache.  She denies any fevers or chills.  No apparent heart failure symptoms.  The remainder of review of systems is negative except for HPI and problem list.  PROBLEM LIST: 1. Coronary artery disease as described above. 2. Hypertension. 3. Hyperlipidemia. 4. COPD. 5. Obesity. 6. Sleep apnea.  CURRENT MEDICATIONS: 1. Aspirin 325. 2. Prozac 10 mg a  day. 3. Metoprolol 12.5 b.i.d. 4. Protonix 40 mg IV a day.  ALLERGIES:  SULFA, CEPHALOSPORINS, MOXIFLOXACIN, AMOXICILLIN, and VICODIN.  SOCIAL HISTORY:  She is a former smoker, quit 4 years ago.  She is currently retired.  She lives at home with her elderly mother.  FAMILY HISTORY:  Father died of a heart attack in his early 3s.  PHYSICAL EXAMINATION:  GENERAL:  She is lying in bed with a towel over her forehead.  She has significant dysarthria and is very difficult to understand.  Respirations are unlabored. VITAL SIGNS:  Blood pressure is 136/81, heart rate is 74, she is satting 97% on 2 L. HEENT:  Notable for a left-sided facial droop, otherwise normal. NECK:  Supple.  There is no JVD.  Carotids are 2+ bilaterally without any bruits.  There is no lymphadenopathy or thyromegaly appreciated. CARDIAC:  PMI is nondisplaced.  She is regular with soft systolic ejection murmur at the left sternal border. LUNGS:  Clear. ABDOMEN:  Obese, nontender, nondistended. EXTREMITIES:  Warm.  There is no cyanosis or clubbing.  She cannot move her left upper extremity.  She can wiggle toes on  both feet.  Has obvious left-sided facial droop and dysarthria.  EKG shows sinus rhythm at a rate of 86, no ST-T wave abnormalities. There is RSR prime in V1.  Labs show a white count of 8.4, hemoglobin of 12.5, platelet 251.  Sodium 143, potassium 3.7, BUN of 14, creatinine 0.93.  Troponin of less than 0.01.  Hemoglobin A1c of 5.5.  Chest shows low lung volumes, but otherwise clear.  MRI of the brain which I reviewed shows a total occlusion of the right MCA just after its take- off.  There is also evidence of a right MCA stroke.  ASSESSMENT: 1. Acute right middle cerebral artery stroke. 2. Coronary artery disease status post bypass surgery with left heart     cath on September 28, 2010. 3. Hyperlipidemia. 4. Hypertension.  PLAN/DISCUSSION:  Given the timing of the events with a remote cath, her stroke  is not procedurally related.  I explained this to her and her family.  I suspect this is likely secondary to intrinsic vascular disease versus a possible embolic phenomenon, though there is no clear evidence of atrial fibrillation.  We will recommend continued management of her stroke per the Neuro Service, she is scheduled for a TEE tomorrow.  We would resume her Crestor.  Otherwise, no new Rx at this time.  We appreciate the care of the Stroke Service.  Please do not hesitate to call us with questions.     Bevelyn Buckles. Bensimhon, MD     DRB/MEDQ  D:  11/08/2010  T:  11/09/2010  Job:  [id number]  Electronically Signed by Arvilla Meres MD on 12/06/2010 06:45:01 PM

## 2010-12-06 NOTE — Discharge Summary (Signed)
[name], BILLER                ACCOUNT NO.:  [id number]  MEDICAL RECORD NO.:  [id number]           PATIENT TYPE:  I  LOCATION:  4034                         FACILITY:  [facility]  PHYSICIAN:  Erick Colace, M.D.DATE OF BIRTH:  1956-08-09  DATE OF ADMISSION:  11/13/2010 DATE OF DISCHARGE:  12/04/2010                              DISCHARGE SUMMARY   DISCHARGE DIAGNOSES: 1. 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 2. Chronic headaches. 3. Depression. 4. Obstructive sleep apnea. 5. Daytime hypersomnia. 6. Hypertension. 7. Coronary artery disease.  HISTORY OF PRESENT ILLNESS:  [name] is a 55 year old female with history of coronary artery disease, obstructive sleep apnea, chest pain with exertion and recent cath February 2012 revealing elevated left and right filling pressures.  She was admitted on November 07, 2010, with fall and after being found on the floor by her son.  The patient was noted to have left-sided weakness at admission.  CT of head done showed right MCA infarct.  MRI of brain showed right MCA infarct with cytotoxic edema, evidence of occlusive right MCA thrombus with right MCA occlusion 5 mm beyond origin.  TEE done showed EF of 60-65% with borderline atrial septal aneurysm.  No wall abnormality and no thrombus noted.  Carotid Dopplers showed no ICA stenosis.  The patient on Plavix for CVA prophylaxis.  She was noted to have dysphagia and Panda was placed initially.  MBS done on November 22, 2010, shows the patient with severe delay of oral transit with penetration of liquids, thinner than honey. She was started on D1 honey liquids.  Currently, the patient with lethargy that is improving.  Noted to have decreased awareness and attention impacting her overall swallow.  She continues to require cues for safety with mobility as well as noted to have left inattention.  She was started on IV Rocephin for questionable UTI.  The patient  was evaluated by rehab as it was felt that she would benefit from a CIR program.  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  The patient with hernia repair in 1991, hypothyroidism, hypertension, bilateral knee scope, T and A in 1963, emphysema per recent CT of chest, obstructive sleep apnea and hypersomnia.  Bladder sling and mastoidectomy in 1998.  REVIEW OF SYMPTOMS:  Positive for chronic chest pain, right knee pain, weakness, fatigue.  FAMILY HISTORY:  Positive for coronary artery disease and substance abuse.  SOCIAL HISTORY:  The patient lives with mother in 1-level home with 5-7 steps at entry. Patient denis smoking but family reports patient still smoking.  Uses alcohol occasionally.  ALLERGIES:  SULFA, AVELOX, AMOXICILLIN, VICODIN and CEPHALOSPORINS.  PHYSICAL EXAMINATION:  VITAL SIGNS:  Blood pressure 144/93, pulse 50, respiratory rate 18, temperature 97.5. GENERAL:  The patient is obese female lying in bed in no acute distress. Noted to be lethargic. HEENT:  Pupils equal, round, reactive to light.  Oral mucosa is dry with borderline dentition. NECK:  Supple without JVD or lymphadenopathy.  Some  pain in anterior neck with palpation. LUNGS:  Occasional rhonchi. HEART:  Regular rate and rhythm with occasional PACs.  She has pain with palpation over the mid sternum and a sternal defect noted. ABDOMEN:  Soft and nontender with positive bowel sounds. SKIN:  Chronic wound changes in chest. NEUROLOGIC:  Cranial nerves II-XII notable for left central VII with tongue deviation.  She has grossly intact visual field and extraocular eye movement.  Does have significant inattention to the left.  Sensation is diminished throughout on left hand, left leg as well as left arm to pinprick and light touch.  Proprioception is decreased on  left. Judgment is a bit inhibited with poor insight and of poor awareness. Orientation is intact.  Memory is fair to good.  Mood is flat to depressed.  Strength in left upper extremities trace at deltoids, biceps, triceps, 0-5 hand intrinsic on left, left lower extremity 1/5 in left hip, 1 to 2 at left knee, 1/5 left ankle dorsiflexion and plantar flexion.  Right lower extremity she is 2-3/5 at the hip, 3-4/5 at the knee, 4/5 at ankle plantar flexion and dorsiflexion.  HOSPITAL COURSE:  [name] was admitted to rehab on November 12, 2009, for inpatient therapies to consist of PT, OT and speech therapy at least 3 hours 5 days a week.  Past admission physiatrist, rehab RN and therapy team have worked together to provide customized collaborative interdisciplinary care.  Rehab RN has worked with the patient on bowel and bladder program as well as close monitoring of the patient's p.o. intake.  Routine check of labs done during this stay as the patient has had some issues with hypokalemia.  Labs of November 14, 2010, showed potassium at 4.4.  Renal status stable.  LFTs normal.  Last check of labs from December 01, 2010, revealed sodium 139, potassium 4.0, chloride 104, CO2 30, BUN 8, creatinine 0.93, glucose 95.  The patient was noted to have a depressed appearing mood.  Her Effexor was increased to b.i.d. basis.  TSH was checked and was noted to be normal at 2.447.  CBC with diff check reveals hemoglobin 12.0, hematocrit 34.9, white count 4.4, platelets 206.  The patient with some issues with cough as well as chronic shortness of breath.  Therefore, CT of the chest was done on November 16, 2010, revealing stable 1 cm superior segment left lower lobe pleural based ground-glass airspace nodular opacity.  Followup CT recommended in 1 year.  Allowing for technique no new acute abnormality noted.  Speech Therapy has been following the patient on dysphagia treatment.  The patient with dislike of her  dysphagia diet.  This was slowly advanced to D3 diet thin liquids.  The patient also expressed dislike for heart-healthy diet.  To help the patient improve her p.o. intake, good food choices were discussed by dietitian as well as staff. Her diet was liberalized to low salt to prevent fluid overload for now. She is advised to continue a heart-healthy diet past discharge to home. Blood pressures have been checked on b.i.d. basis during this stay. These have been reasonable ranging from 120s to 130s systolic, 70s to 16X diastolic.  Heart rate has been in 50s to low 60s range overall.  O2 sats have been stable.  The patient with chronic insomnia.  She reports being in an unfamiliar environment as well as problems staying asleep. Therefore, her sleeping meds were changed midnight to 1 a.m. to help with better sleep hygiene through the night and this  has been somewhat effective.  She continues on Provigil during the day.  During the patient's stay in rehab, weekly team conferences were held to monitor the patient's progress, set goals as well as discuss barriers to discharge.  At admission, the patient was noted to have dysphagia with decreased speech intelligibility and cognitive deficits characterized by decreased attention for problem solving as well as decreased self monitoring during her self-care tasks.  She was also impacted in her mobility by a left inattention, left lower extremity weakness, decreased sensation, proprioception as well as poor standing balance.  She was noted to have inattention to her left body and environment which further impacted her ability to carry out her ADLs.  At admission, the patient was overall min assist for stand pivot transfers.  She was supervision to sit at the edge of mat.  She could ambulate with +2 total assist for safety for short distances without assisted device.  Berg score was at 1956 100% fall risk.  Physical Therapy has been working with the  patient on strengthening as well as balance deficits to help improve functional movement pattern as well as overall safety.  Currently, the patient is close supervision with intermittent use of cane for ambulating household distances.  She is at close supervision to min assist for navigating stairs.  Balance has greatly improved.  OT has been working with the patient on self-care task.  They have also worked with the patient on utilizing left upper extremities during self-care tasks.  Currently, the patient is at supervision level for bathing, dressing as well as toileting.  Speech Therapy has been working with the patient on monitoring dysphagia diet.  They have also worked with the patient on attending to left environment during functional tasks as well as demonstrating improvement in selective attention for her high-level tasks.  She is currently modified independent for safe swallow strategies.  Able to maintain selective attention to meals at mealtimes with min assist with min directional cues to decrease distraction.  She was able to attend to her left meal at modified independent level during her meal times.  She requires min verbal cues for oral motor exercises. The patient requires 24-hour supervision currently which is unavailable. SNF is recommended and [facility].  The patient is to be discharged to this facility with further follow-up PT, OT and speech therapy to continue past discharge.  DISCHARGE MEDICATIONS: 1. Artificial tears 1 drop each eye t.i.d. p.r.n. 2. Protonix 40 mg a day. 3. Crestor 10 mg p.o. at bedtime. 4. Synthroid 135 mcg p.o. per day. 5. Plavix 75 mg p.o. per day. 6. Provigil 200 mg p.o. q.a.m. 7. Lopressor 12.5 mg p.o. b.i.d. 8. Hydrocortisone cream to irritated areas on face b.i.d. p.r.n. 9. Prozac 40 mg p.o. per day. 10.Wellbutrin 75 mg p.o. b.i.d. 11.Senokot-S two p.o. q. noon and two at bedtime. 12.Flonase  two squirts at each nostril q.p.m. 13.Zyrtec 10 mg p.o. at bedtime. 14.Restoril 7.5 mg p.o. at bedtime and she was supplemented b.i.d. 15.Zanaflex 2 mg p.o. t.i.d.  DIET:  Low-salt with heart healthy restrictions recommended.  ACTIVITY LEVEL:  24-hour supervision.  SPECIAL INSTRUCTIONS:  Progressive PT, OT, speech therapy to continue past discharge.  FOLLOWUP:  The patient to follow up with Dr. Wynn Banker on January 04, 2011, at 11 a.m. for 11:30 appointment.  Follow up with LMD for medical issues.  Follow up with Dr. Riley Kill in 2 weeks.  Follow up with Dr. Janalyn Shy  Sethi in 4 weeks.     Delle Reining, P.A.   ______________________________ Erick Colace, M.D.    PL/MEDQ  D:  12/03/2010  T:  12/04/2010  Job:  [id number]  cc:   Arturo Morton. Riley Kill, MD, [facility] Neta Mends. Panosh, MD Pramod P. Pearlean Brownie, MD  Electronically Signed by Osvaldo Shipper. on 12/06/2010 09:00:41 AM Electronically Signed by Claudette Laws M.D. on 12/06/2010 04:40:21 PM

## 2010-12-18 ENCOUNTER — Ambulatory Visit (INDEPENDENT_AMBULATORY_CARE_PROVIDER_SITE_OTHER): Payer: PRIVATE HEALTH INSURANCE | Admitting: Physician Assistant

## 2010-12-18 ENCOUNTER — Encounter: Payer: Self-pay | Admitting: Physician Assistant

## 2010-12-18 VITALS — BP 124/80 | HR 60 | Ht 64.0 in | Wt 212.0 lb

## 2010-12-18 DIAGNOSIS — I10 Essential (primary) hypertension: Secondary | ICD-10-CM

## 2010-12-18 DIAGNOSIS — I6350 Cerebral infarction due to unspecified occlusion or stenosis of unspecified cerebral artery: Secondary | ICD-10-CM

## 2010-12-18 DIAGNOSIS — E039 Hypothyroidism, unspecified: Secondary | ICD-10-CM | POA: Insufficient documentation

## 2010-12-18 DIAGNOSIS — I63511 Cerebral infarction due to unspecified occlusion or stenosis of right middle cerebral artery: Secondary | ICD-10-CM

## 2010-12-18 DIAGNOSIS — I2510 Atherosclerotic heart disease of native coronary artery without angina pectoris: Secondary | ICD-10-CM

## 2010-12-18 DIAGNOSIS — R002 Palpitations: Secondary | ICD-10-CM

## 2010-12-18 DIAGNOSIS — I5032 Chronic diastolic (congestive) heart failure: Secondary | ICD-10-CM | POA: Insufficient documentation

## 2010-12-18 DIAGNOSIS — R918 Other nonspecific abnormal finding of lung field: Secondary | ICD-10-CM | POA: Insufficient documentation

## 2010-12-18 DIAGNOSIS — I509 Heart failure, unspecified: Secondary | ICD-10-CM

## 2010-12-18 LAB — BASIC METABOLIC PANEL
Calcium: 9 mg/dL (ref 8.4–10.5)
Chloride: 107 mEq/L (ref 96–112)
Creatinine, Ser: 0.8 mg/dL (ref 0.4–1.2)
Sodium: 143 mEq/L (ref 135–145)

## 2010-12-18 MED ORDER — FUROSEMIDE 20 MG PO TABS
20.0000 mg | ORAL_TABLET | Freq: Every day | ORAL | Status: DC
Start: 2010-12-18 — End: 2011-05-22

## 2010-12-18 MED ORDER — POTASSIUM CHLORIDE 10 MEQ PO TBCR: 10.0000 meq | EXTENDED_RELEASE_TABLET | Freq: Every day | ORAL | Status: DC

## 2010-12-18 NOTE — Assessment & Plan Note (Signed)
Repeat chest CT due in 2013

## 2010-12-18 NOTE — Assessment & Plan Note (Signed)
She has some increased dyspnea since she has been off diuretics.  I will place her back on Lasix 20 mg a day along with potassium 10 mEq a day.  She will have a basic metabolic panel obtained today.  She will have a repeat basic metabolic panel in one week.  Followup with Dr. Tedra Senegal in 6 weeks.

## 2010-12-18 NOTE — Assessment & Plan Note (Signed)
Stable without angina.  Continue  Plavix.

## 2010-12-18 NOTE — Patient Instructions (Signed)
Your physician recommends that you return for lab work in: TODAY BMET 428.32.      A PAPER PRESCRIPTION HAS BEEN GIVEN TO YOU TODAY TO BRING BACK TO [facility] FOR LAB WORK, PLEASE GIVE TO YOUR NURSE WHEN YOU RETURN  TODAY.  Your physician has recommended you make the following change in your medication: RESTART LASIX 20 MG 1 TAB DAILY, POTASSIUM 10 mEq 1 TAB DAILY. YOU MAY TAKE AN EXTRA METOPROLOL WHEN YOU FEEL PALPITATIONS AS PER SCOTT WEAVER, PA-C.  Your physician has recommended that you wear a holter monitor 785.1, PT GOES TO WATER THERAPY. Holter monitors are medical devices that record the heart's electrical activity. Doctors most often use these monitors to diagnose arrhythmias. Arrhythmias are problems with the speed or rhythm of the heartbeat. The monitor is a small, portable device. You can wear one while you do your normal daily activities. This is usually used to diagnose what is causing palpitations/syncope (passing out).

## 2010-12-18 NOTE — Progress Notes (Signed)
History of Present Illness: Primary Cardiologist:  Dr.  Shawnie Pons  [name] is a 55 y.o. female with a h/o CAD, s/p CABG, stable anatomy by cath in 09/2010, recent h/o dyspnea thought to be related to diastolic dysfunction after right heart cath and pulmonary nodules monitored by CT scans who was admitted to [facility] 2/29 to 3/6 with an acute right MCA CVA.  She was d/c to rehab for 3/6-3/27 and was discharged to SNF.  She presents for follow up.  She is currently residing at [facility] nursing facility.  She is working with rehabilitation.  She continues to have left-sided weakness in her upper extremity.  Her swallowing is improving.  Her balance is slowly improving.  She still has some left facial droop.  Her appetite is not great but improving.  She has chronic chest soreness from a sternal wound infection after her bypass.  This is unchanged.  She has an occasional palpitation that sounds like a PVC.  This is somewhat uncomfortable.  She denies syncope.  She does have exertional shortness of breath and she works at physical therapy.  She had previously been placed on Lasix for diastolic dysfunction.  This was improving her shortness of breath.  She is currently not on a diuretic.  She sleeps on one pillow do to orthopnea.  She denies PND.  She denies significant pedal edema.   Past Medical History  Diagnosis Date  . CAD (coronary artery disease)     cath 09/2010: LAD stent patent, S-Int/dCFX ok, S-PDA ok, L-LAD atretic  . Hypertension   . Hx of CABG   . Diastolic dysfunction     TEE 11/2010: EF 60-65%, BAE, trivial atrial septal shunt;  right heart cath in 10/2010 with elevated R and L heart pressures and diuretic started  . HLD (hyperlipidemia)   . Hypothyroidism   . Sleep apnea   . COPD (chronic obstructive pulmonary disease)   . Pulmonary nodules     repeat CT due in 11/2011  . Acute right MCA stroke 11/07/10    Current Outpatient Prescriptions  Medication Sig Dispense Refill  .  acetaminophen (TYLENOL) 650 MG CR tablet Take 650 mg by mouth every 8 (eight) hours as needed.        [name] buPROPion (WELLBUTRIN) 75 MG tablet Take 75 mg by mouth 2 (two) times daily.        . cetirizine (ZYRTEC) 10 MG tablet Take 10 mg by mouth daily.        . clopidogrel (PLAVIX) 75 MG tablet Take 75 mg by mouth daily.        [name] FLUoxetine (PROZAC) 40 MG capsule Take 40 mg by mouth at bedtime.        . fluticasone (FLONASE) 50 MCG/ACT nasal spray 2 sprays by Nasal route daily.        [name] levothyroxine (SYNTHROID, LEVOTHROID) 137 MCG tablet TAKE ONE TABLET BY MOUTH ONE TIME DAILY  30 tablet  0  . metoprolol tartrate (LOPRESSOR) 12.5 mg TABS Take 12.5 mg by mouth 2 (two) times daily.        . modafinil (PROVIGIL) 200 MG tablet Take 200 mg by mouth daily.        . NON FORMULARY NYROXYZINE --- 100mg  every 8 hours as needed for itching       . pantoprazole (PROTONIX) 40 MG tablet Take 40 mg by mouth daily.        . rosuvastatin (CRESTOR) 10 MG tablet Take 10 mg  by mouth daily.        [name] senna (SENOKOT) 8.6 MG tablet Take 2 tablets by mouth daily.        . temazepam (RESTORIL) 7.5 MG capsule Take 7.5 mg by mouth at bedtime as needed.        . tizanidine (ZANAFLEX) 2 MG capsule Take 2 mg by mouth 3 (three) times daily.        . traMADol (ULTRAM) 50 MG tablet Take 50 mg by mouth as needed.        . zolpidem (AMBIEN) 5 MG tablet Take 5 mg by mouth at bedtime as needed.        . cyclobenzaprine (FLEXERIL) 10 MG tablet TAKE ONE TABLET BY MOUTH AT BEDTIME OR AS DIRECTED  30 tablet  0    Allergies  Allergen Reactions  . Amoxicillin     REACTION: unspecified  . Avelox (Moxifloxacin Hcl In Nacl)   . Hydrocodone     REACTION: itching  . Penicillins     REACTION: hives  . Sulfonamide Derivatives     REACTION: ?   ROS:  Please see the history of present illness.  No fevers, chills.  She has a nonproductive cough.  No melena.  All other systems reviewed and negative.  Vital Signs: BP 124/80  Pulse 60   Ht 5\' 4"  (1.626 m)  Wt 212 lb (96.163 kg)  BMI 36.39 kg/m2  PHYSICAL EXAM: Well nourished, well developed, in no acute distress, in a wheelchair HEENT: normal except for slight left facial droop Neck: no JVD at 90 degrees Cardiac:  normal S1, S2; RRR; no murmur Lungs:  clear to auscultation bilaterally, no wheezing, rhonchi or rales Abd: soft, nontender Ext: no edema Skin: warm and dry Neuro:  Facial droop as noted; she is AAOx3  EKG:  Sinus rhythm, heart rate 60, normal axis, T wave inversions in leads one, 2, 3, V4-V6; no significant change in compared to tracing dated 08/10/10  ASSESSMENT AND PLAN:

## 2010-12-18 NOTE — Assessment & Plan Note (Signed)
Slowly recovering.  She remains in SNF.

## 2010-12-18 NOTE — Assessment & Plan Note (Signed)
These sound like PVCs.  I will arrange a Holter monitor.  She can take an extra 12.5 mg of metoprolol as needed.  Check a basic metabolic panel today.  Of note, her TSH was normal last month.

## 2010-12-20 ENCOUNTER — Encounter: Payer: Self-pay | Admitting: Internal Medicine

## 2010-12-28 ENCOUNTER — Encounter: Payer: Self-pay | Admitting: Cardiology

## 2010-12-28 ENCOUNTER — Ambulatory Visit (INDEPENDENT_AMBULATORY_CARE_PROVIDER_SITE_OTHER): Payer: PRIVATE HEALTH INSURANCE

## 2010-12-28 DIAGNOSIS — R002 Palpitations: Secondary | ICD-10-CM

## 2011-01-03 ENCOUNTER — Telehealth: Payer: Self-pay | Admitting: Cardiology

## 2011-01-03 NOTE — Telephone Encounter (Signed)
I spoke with the pt and made her aware that per the lab work she had rechecked on 12/25/10 in the rehab facility her potassium was 3.2.  Per the documentation on lab the pt's potassium was increased to daily.  The pt said she has both the and tablets at home and just wanted to clarify which dose she should be taking.  The pt also also a cold at this time and I made her aware that she can take Coricidin HBP OTC for symptoms.

## 2011-01-04 ENCOUNTER — Inpatient Hospital Stay: Payer: PRIVATE HEALTH INSURANCE | Admitting: Physical Medicine & Rehabilitation

## 2011-01-04 ENCOUNTER — Encounter: Payer: PRIVATE HEALTH INSURANCE | Attending: Physical Medicine & Rehabilitation

## 2011-01-04 DIAGNOSIS — I69959 Hemiplegia and hemiparesis following unspecified cerebrovascular disease affecting unspecified side: Secondary | ICD-10-CM | POA: Insufficient documentation

## 2011-01-04 DIAGNOSIS — I2510 Atherosclerotic heart disease of native coronary artery without angina pectoris: Secondary | ICD-10-CM | POA: Insufficient documentation

## 2011-01-04 DIAGNOSIS — G4733 Obstructive sleep apnea (adult) (pediatric): Secondary | ICD-10-CM | POA: Insufficient documentation

## 2011-01-04 DIAGNOSIS — G8110 Spastic hemiplegia affecting unspecified side: Secondary | ICD-10-CM

## 2011-01-04 DIAGNOSIS — Z79899 Other long term (current) drug therapy: Secondary | ICD-10-CM | POA: Insufficient documentation

## 2011-01-04 DIAGNOSIS — I6330 Cerebral infarction due to thrombosis of unspecified cerebral artery: Secondary | ICD-10-CM

## 2011-01-04 NOTE — Assessment & Plan Note (Signed)
REASON FOR VISIT:  Right MCA distribution CVA with left hemiparesis.  A 55 year old female with prior history of coronary artery disease, OSA who was found on the floor by her son November 07, 2010, with fall.  She was noted to have left-sided weakness at admission.  CT showed right MCA infarct.  She had a right MCA thrombus, had a TEE showing a borderline atrial septal aneurysm.  No trauma detected.  Carotid Doppler showed no ICA stenosis.  She was placed on Plavix for CVA prophylaxis.  She was admitted to inpatient rehab November 13, 2010, and was there until December 04, 2010.  She was discharged at a supervision level.  Modified independent with swallowing.  Because of her need for 24-hour supervision, she was transferred to skilled nursing facility.  She stayed there until she was discharged to her sister's home in the last week or two.  She states that she does not like being in her sisters home and wants to move out of there as soon as possible even today.  She states she has not had any further falls since discharging to home.  She has had no home health therapy after getting therapy at the skilled nursing facility.  She would like to go to the outpatient therapy.  She has had no seizures. She has been seen by the doctor at the skilled nursing facility.  She has Dr. Riley Kill for Cardiology followup.  Review of E-chart does not show any recent Cardiology visits, but she has talked to Dr. Rosalyn Charters office by phone.  Her current meds include; Crestor, Slow-Mag, omeprazole, levothyroxine, Provigil, hydroxyzine, temazepam, Plavix, venlafaxine and potassium.  ALLERGIES:  AMOXICILLIN, AVELOX, CODEINE and SULFA.  PHYSICAL EXAMINATION:  She has 5/5 strength in the right side.  On the left side, she has 4/5 in the deltoid, biceps, triceps and grip, 4+ in the left hip flexor, knee extensor and ankle dorsiflexor.  Her gait shows no evidence of toe drag or knee instability.  She can walk with  a cane, in fact does not really need to use it for the most part.  She does have difficulty with tandem gait and needs to intermittently balance with her cane in the wall to do this.  Extraocular movements are intact.  Her visual fields are intact to confrontation testing.  There is no evidence of visual neglect or tactile inattention to confrontation testing.  Her sensory exam is mildly abnormal on the left side.  She is able to distinguish which finger is touched, although she states it does not have full normal feeling as it does on the right side.  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  I think she could live at home with intermittent supervision, but I strongly discouraged her from just moving out of her sister's house and trying to find a apartment today.  She states she would get with her son and talked this over and see if they could coordinate this.  She had concerns that her sister has taken over some of her finances as well.  I received some paperwork.  I have made referral to outpatient therapy, no driving for now and no return to work for now.  Discussed the patient, agrees with plan.     Erick Colace, M.D. Electronically Signed    AEK/MedQ D:  01/04/2011 14:09:56  T:  01/04/2011 23:41:49  Job #:  [id number]  cc:   Arturo Morton. Riley Kill, MD, [facility] [address]  [address] [place] [address]

## 2011-01-10 ENCOUNTER — Other Ambulatory Visit: Payer: Self-pay | Admitting: Internal Medicine

## 2011-01-16 ENCOUNTER — Telehealth: Payer: Self-pay | Admitting: *Deleted

## 2011-01-16 NOTE — Telephone Encounter (Signed)
Pt needs a refill on ultram and effexor 75. No appts made for our office. Pt is seeing Dr. Trecia Rogers at the [facility]

## 2011-01-16 NOTE — Telephone Encounter (Signed)
Per Dr. Fabian Sharp- can refill effexor x 2 months if still on it but ultram needs to be refilled by Dr. Trecia Rogers office. I also denied effexor because I don't see anything in EPIC or centricity on where she is taking this medication. Pt must call our office first about this.

## 2011-01-16 NOTE — Telephone Encounter (Signed)
Please see who first prescribed it.. perhaps the pain management doctor can prescribe this medicine also. I have not seen this patient since her hospitalization.

## 2011-01-17 ENCOUNTER — Ambulatory Visit: Payer: PRIVATE HEALTH INSURANCE | Attending: Physical Medicine & Rehabilitation | Admitting: Occupational Therapy

## 2011-01-17 DIAGNOSIS — M6281 Muscle weakness (generalized): Secondary | ICD-10-CM | POA: Insufficient documentation

## 2011-01-17 DIAGNOSIS — I69919 Unspecified symptoms and signs involving cognitive functions following unspecified cerebrovascular disease: Secondary | ICD-10-CM | POA: Insufficient documentation

## 2011-01-17 DIAGNOSIS — I69998 Other sequelae following unspecified cerebrovascular disease: Secondary | ICD-10-CM | POA: Insufficient documentation

## 2011-01-17 DIAGNOSIS — R279 Unspecified lack of coordination: Secondary | ICD-10-CM | POA: Insufficient documentation

## 2011-01-17 DIAGNOSIS — Z5189 Encounter for other specified aftercare: Secondary | ICD-10-CM | POA: Insufficient documentation

## 2011-01-17 DIAGNOSIS — R269 Unspecified abnormalities of gait and mobility: Secondary | ICD-10-CM | POA: Insufficient documentation

## 2011-01-17 NOTE — Telephone Encounter (Signed)
Left message for pt to call back  °

## 2011-01-18 ENCOUNTER — Encounter: Payer: Self-pay | Admitting: Internal Medicine

## 2011-01-18 ENCOUNTER — Ambulatory Visit (INDEPENDENT_AMBULATORY_CARE_PROVIDER_SITE_OTHER): Payer: PRIVATE HEALTH INSURANCE | Admitting: Internal Medicine

## 2011-01-18 VITALS — BP 120/80 | HR 60 | Temp 97.6°F | Wt 207.0 lb

## 2011-01-18 DIAGNOSIS — L750 Bromhidrosis: Secondary | ICD-10-CM

## 2011-01-18 DIAGNOSIS — J329 Chronic sinusitis, unspecified: Secondary | ICD-10-CM

## 2011-01-18 DIAGNOSIS — G4730 Sleep apnea, unspecified: Secondary | ICD-10-CM

## 2011-01-18 DIAGNOSIS — I639 Cerebral infarction, unspecified: Secondary | ICD-10-CM

## 2011-01-18 DIAGNOSIS — F4321 Adjustment disorder with depressed mood: Secondary | ICD-10-CM

## 2011-01-18 DIAGNOSIS — R350 Frequency of micturition: Secondary | ICD-10-CM

## 2011-01-18 DIAGNOSIS — I6350 Cerebral infarction due to unspecified occlusion or stenosis of unspecified cerebral artery: Secondary | ICD-10-CM

## 2011-01-18 DIAGNOSIS — L748 Other eccrine sweat disorders: Secondary | ICD-10-CM

## 2011-01-18 LAB — POCT URINALYSIS DIPSTICK
Glucose, UA: NEGATIVE
Protein, UA: NEGATIVE
Urobilinogen, UA: 0.2

## 2011-01-18 MED ORDER — CEFUROXIME AXETIL 250 MG PO TABS: 250.0000 mg | ORAL_TABLET | Freq: Two times a day (BID) | ORAL | Status: DC

## 2011-01-18 NOTE — Assessment & Plan Note (Signed)
Agree with followup with neuro psychiatrist.  For medicine management at this time.

## 2011-01-18 NOTE — Progress Notes (Signed)
Subjective:    Patient ID: [name], female    DOB: 07-May-1956, 55 y.o.   MRN: [id number]  HPI Patient comes in today for an acute visit. Since her last visit with [place] much has happened. She was hospitalized February 29 for an acute CVA with left upper arm and facial weakness. Since that time she has been discharged was in the nursing home and is being followed up with rehabilitation medicine  .  She is now able to walk pretty well without falling although she has some difficulty focusing and remembering some things. She states she had neurocognitive functions and did well on nose testing.   However in the meantime there has been a falling out with her home situation.  She gave her sister temporary access to her banking accounts and legal signature. She was previously in a rented home and at least was terminated and she moved in with her sister and her mother.  There was discord and The feeling that her sister was controlling everything about her life.  When the patient refused her sister to go into the exam room with the neurologist  she told her to get out of her to leave her house.  So she did and is now living in a temporary extended stay hotel.  sHe plans on finding a  one from rental. As per dictation is her family son or taxi. While she was in the rehabilitation and hospital she was placed on Effexor for depression. She states that she still has depression but is not suicidal.  She is a reasonably good memory but has a cold the concentrating and staying on task. She has residual weakness in her left upper  extremity. and face.  She then began to have upper respiratory congestion and face pain frontal and right maxillary onset about 3 weeks ago.   Not getting better  No fever  And her breathing is not worse. She has a mild cough which does aggravate her sternal pain  No sob or wheezing. She also has having a change in her urine  being a foul odor sometimes it is difficult to void possibly  from medications. Living in extended stay  .    Review of Systems Neg sob  Cough makes  chest pain worse. No falling no joint swelling no unusual rashes.  Past Medical History  Diagnosis Date  . CAD (coronary artery disease)     cath 09/2010: LAD stent patent, S-Int/dCFX ok, S-PDA ok, L-LAD atretic  . Hypertension   . Hx of CABG   . Diastolic dysfunction     TEE 11/2010: EF 60-65%, BAE, trivial atrial septal shunt;  right heart cath in 10/2010 with elevated R and L heart pressures and diuretic started  . HLD (hyperlipidemia)   . Hypothyroidism   . Sleep apnea   . COPD (chronic obstructive pulmonary disease)   . Pulmonary nodules     repeat CT due in 11/2011  . 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 No pertinent past surgical history.  reports that she quit smoking about 4 years ago. She does not have any smokeless tobacco history on file. She reports that she does not drink alcohol or use illicit drugs. family history includes Arthritis in her mother; COPD in her mother; Depression in her father; Heart attack in her father; Heart disease in her mother; and Hypertension in her father. Allergies  Allergen Reactions  . Amoxicillin     REACTION: unspecified  .  Avelox (Moxifloxacin Hcl In Nacl)   . Hydrocodone     REACTION: itching  . Penicillins     REACTION: hives  . Sulfonamide Derivatives     REACTION: ?        Objective:   Physical Exam Well-developed well-nourished in no acute distress. She is verbal and pretty articulate oriented x3 She has left facial weakness and mild left upper sternal E. Weakness.  She walks pretty steadily although uses a cane for support if needed the right. There is no foot drop. HEENT: Normocephalic ;atraumatic , Eyes;  PERRL, EOMs  Full, lids and conjunctiva clear,,Ears: no deformities, canals nl, TM landmarks normal, Nose: no deformity or congested  Face midly tender   Frontal and right maxilla  OP  without lesion or edema . Airway  adequate.  Chest:  Clear to A&P without wheezes rales or rhonchi CV:  S1-S2 no gallops or murmurs peripheral perfusion is normal Skin no acute rashes      Assessment & Plan:  Prolonged URI  Poss sinusitis  Over 3 weeks  Sensitivity to many antibiotics   Had taken cipro in the past without problem but had hallucination with avelox in hospital per patient. She states that she is not known to be allergic to cephalosporins and has had Ceftin in the remote past.. we'll try this medication with caution.  Urinary difficulties  Small amount of urine  Showed  1+ blood and leuk  S/P cva mca right with left UE paresis.  Depression  Aggravated by all of above and lack of control .     rec psych manage meds for now   Will help and facilitate as much as possible. Difficult social situation  Total visit 30 mins > 50% spent counseling and coordinating care

## 2011-01-18 NOTE — Patient Instructions (Signed)
Take antibiotic for sinusitis and possible UTI. Does not appear that you are allergic to this medicine but there is a possibility. Call or stop if he did any rash itching or swelling.  G mental when the culture is back. Agree with having neuropsychiatrist manage her depression medicines at this point in time. Keep [place] informed in how we can help. Expect improvement in the next 5 days.

## 2011-01-18 NOTE — Telephone Encounter (Signed)
Pt came in for an appt

## 2011-01-18 NOTE — Assessment & Plan Note (Signed)
Residual left upper extremity e and left facial weakness with some dysphasia. Currently on regular liquids  soft solids no popcorn or pretzels.  Currently in rehabilitation.

## 2011-01-19 ENCOUNTER — Emergency Department (HOSPITAL_COMMUNITY): Payer: PRIVATE HEALTH INSURANCE

## 2011-01-19 ENCOUNTER — Emergency Department (HOSPITAL_COMMUNITY)
Admission: EM | Admit: 2011-01-19 | Discharge: 2011-01-19 | Disposition: A | Discharge status: Home / Self Care | Payer: PRIVATE HEALTH INSURANCE | Attending: Emergency Medicine | Admitting: Emergency Medicine

## 2011-01-19 DIAGNOSIS — R51 Headache: Secondary | ICD-10-CM | POA: Insufficient documentation

## 2011-01-19 DIAGNOSIS — R4789 Other speech disturbances: Secondary | ICD-10-CM | POA: Insufficient documentation

## 2011-01-19 DIAGNOSIS — G459 Transient cerebral ischemic attack, unspecified: Secondary | ICD-10-CM | POA: Insufficient documentation

## 2011-01-19 DIAGNOSIS — E7800 Pure hypercholesterolemia, unspecified: Secondary | ICD-10-CM | POA: Insufficient documentation

## 2011-01-19 DIAGNOSIS — I10 Essential (primary) hypertension: Secondary | ICD-10-CM | POA: Insufficient documentation

## 2011-01-19 DIAGNOSIS — I2510 Atherosclerotic heart disease of native coronary artery without angina pectoris: Secondary | ICD-10-CM | POA: Insufficient documentation

## 2011-01-19 DIAGNOSIS — Z8679 Personal history of other diseases of the circulatory system: Secondary | ICD-10-CM | POA: Insufficient documentation

## 2011-01-19 DIAGNOSIS — E039 Hypothyroidism, unspecified: Secondary | ICD-10-CM | POA: Insufficient documentation

## 2011-01-19 DIAGNOSIS — R404 Transient alteration of awareness: Secondary | ICD-10-CM | POA: Insufficient documentation

## 2011-01-19 DIAGNOSIS — R29898 Other symptoms and signs involving the musculoskeletal system: Secondary | ICD-10-CM | POA: Insufficient documentation

## 2011-01-19 DIAGNOSIS — Z951 Presence of aortocoronary bypass graft: Secondary | ICD-10-CM | POA: Insufficient documentation

## 2011-01-19 DIAGNOSIS — I509 Heart failure, unspecified: Secondary | ICD-10-CM | POA: Insufficient documentation

## 2011-01-19 LAB — POCT CARDIAC MARKERS: Myoglobin, poc: 68.9 ng/mL (ref 12–200)

## 2011-01-19 LAB — CBC
Hemoglobin: 11.5 g/dL — ABNORMAL LOW (ref 12.0–15.0)
MCH: 30.1 pg (ref 26.0–34.0)
MCV: 87.7 fL (ref 78.0–100.0)
RBC: 3.82 MIL/uL — ABNORMAL LOW (ref 3.87–5.11)

## 2011-01-19 LAB — DIFFERENTIAL
Eosinophils Absolute: 0.2 10*3/uL (ref 0.0–0.7)
Eosinophils Relative: 3 % (ref 0–5)
Lymphs Abs: 1.2 10*3/uL (ref 0.7–4.0)
Monocytes Absolute: 0.4 10*3/uL (ref 0.1–1.0)
Monocytes Relative: 6 % (ref 3–12)

## 2011-01-19 LAB — URINALYSIS, ROUTINE W REFLEX MICROSCOPIC
Glucose, UA: NEGATIVE mg/dL
Leukocytes, UA: NEGATIVE
Nitrite: NEGATIVE
Protein, ur: NEGATIVE mg/dL
pH: 6 (ref 5.0–8.0)

## 2011-01-19 LAB — APTT: aPTT: 30 seconds (ref 24–37)

## 2011-01-19 LAB — PROTIME-INR: Prothrombin Time: 13.8 seconds (ref 11.6–15.2)

## 2011-01-19 LAB — URINE MICROSCOPIC-ADD ON

## 2011-01-20 LAB — URINE CULTURE: Colony Count: >=100000

## 2011-01-21 ENCOUNTER — Ambulatory Visit: Payer: PRIVATE HEALTH INSURANCE | Admitting: Physical Therapy

## 2011-01-22 NOTE — Assessment & Plan Note (Signed)
[facility]                            CARDIOLOGY OFFICE NOTE   MEGHANA, TULLO                       MRN:          [id number]  DATE:05/11/2008                            DOB:          October 14, 1955    [name] is in for followup.  Most recently, she has noticed some shortness  of breath between classes.  She has kind of felt bad, rather tired and  weak.  She did the second summer session.  She decided not to return  because of a lot of her symptoms.  She is not able to really exercise or  walk, and recently she has not been improved.  Unfortunately, she also  has run out of some of her medicines and not resumed these.  She is  concerned because she thinks a lot of these symptoms are similar to what  she had previously.   MEDICINES:  1. Metoprolol 12.5 mg p.o. b.i.d., which she has run out of.  2. Simvastatin 80, which she ran out of.  3. Levothyroxine 125 mcg, which I believe she ran out of.  4. Wellbutrin 1 daily.  5. Prilosec daily.  6. She is also taking Topamax.   On her examination today, she is somewhat tearful with a blood pressure  of 140/82 and a pulse of 58.  The lung fields are clear and the cardiac  rhythm is regular.  Extremities reveal no definite edema.   Her electrocardiogram demonstrates sinus rhythm with nonspecific T-wave  abnormality.   I am obviously concerned about her symptoms.  She does have small  vessels.  Fortunately, she has not resumed any kind of smoking.  The  patient had a difficult sternal wound repair, and is albeit quite  frustrated.  I suspect the only way to really determine whether there is  a graft issue at this point would be to recommend a repeat  catheterization.  We could do noninvasive testing, but we would not  definitely get the answer.  She is going to think about this, and we  will talk with her about the potential options.     Arturo Morton. Tina Kill, MD, [facility]  Electronically Signed    TDS/MedQ   DD: 05/14/2008  DT: 05/14/2008  Job #: [id number]

## 2011-01-22 NOTE — Assessment & Plan Note (Signed)
OFFICE VISIT   [name], [name]  DOB:  19-Dec-1955                                        April 06, 2007  CHART #:  [id number]   HISTORY OF PRESENT ILLNESS:  The patient returns to the office today for  further evaluation followup related to chronic chest wall pain, status  post sternal wound debridement with bilateral pectoralis muscle flap  reconstruction in January 2008.  She was last seen here in the office on  February 23, 2007.  She reports that since then she has seen a little bit of  improvement.  She tried Neurontin for a month, but she did not think it  helped at all, so she stopped taking it.  Despite that, she has seen  some improvement in her pain.  She still has problems with pain, but the  amount of pain relievers she is taking has decreased considerably.  She  ran out of Fentanyl patches 2 weeks ago, and she decided to just stay  off of it.  She has had to use a little bit more Ultram since then but  overall she is only using about 4 tablets daily at present.  She  otherwise feels well.  Her activity level is quite good and she can  exercise on a treadmill and bicycle without any exertional shortness of  breath or chest discomfort.  She is back at work and getting back to  more normal activity.   The remainder of her review of systems is unrevealing.   The remainder of her past medical history is unchanged.   PHYSICAL EXAMINATION:  General:  Notable for a well-appearing, obese  female.  Vital signs:  Blood pressure 118/66, pulse 56, oxygen  saturation 98% on room air.  Chest:  Reveals surgical incision that has  healed completely.  There is no palpable fluctuance of tenderness to  suggest fluid collection beneath either flap.  Breath sounds are clear  to auscultation and symmetrical bilaterally.  Cardiovascular:  Includes  a regular rate and rhythm.  No murmurs, rubs, or gallops are noted.  Abdomen:  Soft, nontender.  Extremities:  Warm and well  perfused.   IMPRESSION:  Slow improvement with chronic pain related to previous  sternal wound debridement with bilateral pectoralis major muscle flap  closure in January 2008.   The patient is slowly improving, although she still has some pain.   PLAN:  We will continue with Ultram as needed for pain and the patient  will continue to try and wean herself off as tolerated.  She uses  nonsteroidal antiinflammatory agents occasionally as well with some  improvement.  We will plan to see her back in 6 months if needed.   Salvatore Decent. Cornelius Moras, M.D.  Electronically Signed   CHO/MEDQ  D:  04/06/2007  T:  04/06/2007  Job:  [id number]

## 2011-01-22 NOTE — Assessment & Plan Note (Signed)
[facility]                             PULMONARY OFFICE NOTE   MELESSIA, KAUS                       MRN:          [id number]  DATE:07/31/2007                            DOB:          Jul 15, 1956    PROBLEMS:  1. Obstructive sleep apnea.  2. Dyspnea.  3. Coronary artery disease/bypass graft/stent.   HISTORY:  Her primary concern remains excessive daytime sleepiness.  She  has prior experience with stimulant alerting medications, but none  recent.  She is concerned about a teenage child getting access to some  classes of medicines.  Auto titration suggested we increase her CPAP  pressure 1 CWP, and I cannot anticipate that that will make a dramatic  change in the tiredness that she feels.  We do need to keep watching to  be sure that her complaint really is sleepiness, and we may end up  needing to do a multiple sleep latency test if the issue remains  ambiguous.  Medicines were reviewed.  She is using CPAP every night.   OBJECTIVE:  Weight 219 pounds, BP 124/78, pulse 70, room air saturation  99%.  She is alert, no pressure marks on her face, no evident nasal  congestion.  Speech is clear.  Breathing unlabored, pulse regular.   IMPRESSION:  Obstructive sleep apnea with excessive daytime somnolence.  Dyspnea is not currently a primary complaint.   PLAN:  1. [facility] is going to increase her continuous positive airway      pressure to 14 CWP, and she is given written prescription for that.      She will contact them.  2. Sample and prescription Provigil 200 mg to use 1/2 or 1 daily      p.r.n.  We discussed this very carefully.  Continue efforts of good      sleep hygiene.  Schedule return 6 months, earlier p.r.n.  Question      multiple sleep latency test.     [name], MD, [facility], FACP  Electronically Signed    CDY/MedQ  DD: 07/31/2007  DT: 07/31/2007  Job #: [id number]   cc:   Neta Mends. Fabian Sharp, MD

## 2011-01-22 NOTE — Assessment & Plan Note (Signed)
[facility]                             PULMONARY OFFICE NOTE   [name]                       MRN:          [id number]  DATE:07/31/2007                            DOB:          13-Apr-1956    PROBLEMS:  1. Obstructive sleep apnea.  2. Dyspnea.  3. Coronary artery disease/bypass graft/stent.  4. Excessive daytime somnolence.   HISTORY:  We titrated her CPAP which had been at 13. The new pressure  suggestion is 13.8 and we will take her up to 14, but realistically, it  does not seem likely that this will make a great difference in her  persistent complaint of excessive daytime somnolence, which we have  discussed again. She sleeps alone with no reports available, but she is  not aware of significant snoring or movement disorder in her sleep. She  is trying to be pretty consistent with her sleep hygiene.   MEDICATIONS:  Reviewed and charted. Noting that she wears a Phentanyl  patch and takes Topamax, both of which may contribute to daytime  sleepiness.   She avoids caffeine because it may raise her blood pressure. I do not  get a history suggesting cataplexy or sleep paralysis and we discussed  possible interventions for her complaint of sleepiness because we do not  want to compound emotional problems or her blood pressure.   OBJECTIVE:  Weight 219 pounds, blood pressure 124/78, pulse 70, room air  saturation 99%. Alert and appropriate. There is no tremor. Neurologic  was unremarkable to observation. Mood seemed normal. There were no  pressure marks on her face from CPAP.   IMPRESSION:  Obstructive sleep apnea with residual complaint of daytime  sleepiness. We will adjust her CPAP pressure, but I am going to let her  try Provigil for therapeutic affect after a detailed discussion of the  medication. She had used it years ago and does not remember why she  stopped. She wants to avoid Ritalin.   PLAN:  1. [facility] is going to  increase CPAP to 14 CWP.  2. Provigil 200 mg samples and prescription 1/2 to 1 daily p.r.n.  3. Schedule return 6 months, question MSLT.     [name], MD, [facility], FACP  Electronically Signed   CDY/MedQ  DD: 08/01/2007  DT: 08/02/2007  Job #: [id number]   cc:   Tina Mends. Fabian Sharp, MD

## 2011-01-22 NOTE — Assessment & Plan Note (Signed)
[facility]                            CARDIOLOGY OFFICE NOTE   [name], [name]                       MRN:          [id number]  DATE:06/15/2007                            DOB:          [birth year]    [name] is in for a followup visit.   In general, she is stable.  She has not been having any ongoing chest  pain or shortness of breath.  The biggest problem has been popping of  the sternum.  She has had to substantially decrease her activity because  her sternum is popping again.  This has just developed in the last month  or two.  She had seen both Dr. Odis Luster and Dr. Barry Dienes in the past, and  this problem had stabilized but seems to be worse at the present time  limiting her activity.   MEDICATIONS:  1. Metoprolol 12.5 mg p.o. b.i.d.  2. Aspirin 81 mg daily.  3. Simvastatin 80 mg q.h.s.  4. Levothyroxine 125 mcg daily.  5. Fluoxetine 20 mg p.o. b.i.d.  6. Wellbutrin daily.  7. Prilosec 20 mg daily.   PHYSICAL EXAMINATION:  GENERAL:  She is alert and oriented.  VITAL SIGNS:  Blood pressure is 102/70 and the pulse is 56.  The weight  is 212 pounds.  LUNGS:  Even with breathing one can feel the sternal pop.  CARDIAC:  Rhythm is regular without a significant murmur, rub, or  gallop.   EKG today reveals normal sinus rhythm.  There is nonspecific T wave  abnormality.   Recent lipid profile reveals normal liver functions and an LDL of 96  with an HDL of 44 on simvastatin 80.   IMPRESSION:  1. Coronary artery disease status post coronary artery bypass graft      surgery complicated by a sternal infection.  2. Status post sternal reconstruction with recent recurrence of      popping with breaths.  3. Hypercholesterolemia on lipid-lowering therapy, not at target.  4. Prior history of smoking, now resolved.  5. Obstructive sleep apnea.   PLAN:  1. The patient is on a titration program for her sleep apnea at the      present time.  2. I  have suggested that she see Dr. Barry Dienes back in followup given the      recurrence of the problem at the present time, I think he would be      the one most appropriate to see at present.  3. We talked about options with regard to her lipids.  One could add      Zetia but in light of her recent data, we probably will leave her      where she is.  Weight loss would be helpful in trying to normalize      her LDL.  Because of the economic challenges she faces, it will be      difficult to switch her from a generic to a non-generic form.  4. Return to the clinic in 3 months.     Arturo Morton. Riley Kill, MD, [facility]  Electronically Signed  TDS/MedQ  DD: 06/15/2007  DT: 06/15/2007  Job #: [id number]

## 2011-01-22 NOTE — Assessment & Plan Note (Signed)
[facility]                             PULMONARY OFFICE NOTE   [name], [name]                       MRN:          [id number]  DATE:06/02/2007                            DOB:          01/28/1956    PROBLEM:  1. Obstructive sleep apnea.  2. Dyspnea.  3. Coronary artery disease/bypass graft/stent.   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  She had been using CPAP, initiated by Dr.  Shelle Iron.  She did not return for followup.  She sleeps alone.  Daytime  sleepiness is getting oppressive.  Night-time sleep was irregular when  she was out of work for her heart problems.  Now, bedtime is between 9  and 10 p.m., recognizing only a short sleep latency, and she is getting  up between 5:30 and 5:45 a.m.  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  She is using CPAP every night and has a recent  new mask.  She is not sure what her CPAP pressure setting is.  She  remains off cigarettes, does drink some caffeine.   MEDICATION:  1. Aspirin 81 mg.  2. Levothyroxine 125 micrograms.  3. Furosemide 20 mg and potassium 20 mEq are taken every other day.  4. Metoprolol 25 mg b.i.d.  5. Vytorin 10/40.  6. Fluoxetine 20 mg b.i.d.  7. Wellbutrin.  8. The p.r.n. use of nitroglycerin.  9. Albuterol inhaler p.r.n.   DRUG INTOLERANCE:  SULFA and AMOXICILLIN.   OBJECTIVE:  Weight 207 pounds, BP 136/78, pulse regular 56, room air  saturation 98%.  She is alert.  There is a well-healed sternal incision  scar.  Heart sounds are regular, no murmur or gallop is heard.  Neck  veins are not distended.  There is no peripheral edema.  Lung sounds are  clear.   IMPRESSION:  Obstructive sleep apnea with excessive daytime sleepiness.  Sleep hygiene seems adequate.   PLAN:  1. We are going to check with [facility] to see what they have as her      current CPAP pressure.  2. We are then going to  re-titrate her CPAP.  3. Schedule return in two months and we will look at treatment options      depending on how adequate her current CPAP control seems to be.     [name], MD, [facility], FACP  Electronically Signed    CDY/MedQ  DD: 06/02/2007  DT: 06/03/2007  Job #: [id number]   cc:   Neta Mends. Fabian Sharp, MD

## 2011-01-22 NOTE — Group Therapy Note (Signed)
Consult requested by Salvatore Decent. Cornelius Moras, M.D., in regard to chronic chest  wall pain, chronic postoperative post-CABG pain.   HISTORY:  A 55 year old female who has a history of coronary artery  disease and underwent coronary artery bypass grafting x4 on August 06, 2006.  She had a postoperative mediastinitis and underwent sternal  debridement, right pectoralis muscle turnover flap, left pectoralis  major muscle advancement flap by Drs. Achille Rich on September 23, 2006.  She has had, as expected, chronic instability of the sternal  area.  She has had chronic pain since that time.  She was initially  managed with narcotic analgesics and was on a fentanyl patch 25 mcg  q.72h.  She actually took herself off of this and maintained on  tramadol.  She has been able to get to work on a full-time basis.  She  has used some intermittent nonsteroidal anti-inflammatories as well.  She has had repeat CT of the chest on June 24, 2007, showing no  evidence of infection in the chest area.  There was an incidental  finding of a ground-glass nodule, right lower lobe, with follow-up  recommended.  She has been seeing pulmonary, although they have mainly  followed her for her sleep apnea.   She has gone to [facility] for evaluation of some reconstructive surgery of  the chest to help with instability, but the patient has decided against  undergoing further surgery at this time.  Her pain is worst at night  when she is trying to sleep, and she sleeps on her side.  She has  difficulty sleeping on her back, and this is habitual for her.  Her  average pain is 4/10, currently 6/10, and it interferes with activity at  a 2/10 level.  She can walk on a treadmill 15 minutes at a time until  her chest starts bothering her.  She notices when she takes a deep  breath it bothers her as well.  She feels an intermittent clicking when  she takes a deep breath.  Her relief from medications is poor.  She  walks without  assistance.  She climbs steps.  She drives.  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  She cannot do certain household  duties such as vacuuming.   REVIEW OF SYSTEMS:  Positive for depression as well as shortness of  breath and coughing, but this is more recent since she has had a cold.   PAST SURGICAL HISTORY:  Also significant for a bladder sling in November  1999, mastoidectomy in 1998, arthroscopic surgery of the knee in 1997,  hernia repair in 1991, tonsillectomy in 1963.   She also has a history of hypothyroidism and hypertension.   She is divorced, lives with her two sons and her mother.  She uses  alcohol less than one time per month.   FAMILY HISTORY:  Positive for heart disease, lung disease, high blood  pressure, psychiatric problems, alcohol abuse.   Her blood pressure is 123/68, pulse 70, respiratory rate is 18, O2  saturation is 98% on room air.  GENERAL:  In no acute distress.  Mood and affect appropriate.  BACK:  No tenderness to palpation.  NECK:  No tenderness to palpation.  Deep tendon reflexes are 3+ bilateral biceps, triceps, brachioradialis,  knees and ankles.  Her strength in the upper extremities is 4/5 in the  triceps, otherwise 5/5 in the deltoids, biceps and grip.  Lower  extremity  strength is 5/5.  Her sternotomy scar is nontender to palpation.  No keloid or hypertrophy  of the scar.  She does have tenderness along the incision.  She has  audible clicking in the sternal area with deep inspiration and the chest  freely moves at multiple areas when the ribs are pressed upon.  LUNGS:  Clear.  HEART:  Regular rate and rhythm.  ABDOMEN:  Positive bowel sounds, soft, nontender.   IMPRESSION:  1. 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  2. Coronary artery disease, status post coronary artery bypass      grafting.  No cardiac symptoms.  Has gone through cardiac rehab.  3. Depression contributing  to her overall distress about her      situation.  She has already seen Dr. Fabian Sharp and has been on      bupropion 300 mg a day and fluoxetine 40 mg a day.  She is also on      Topamax for headaches, 50 mg twice a day, as well as Provigil.   RECOMMENDATIONS:  1. Given that she wants to avoid narcotic analgesic medications, will      start her on a Lidoderm patch on each side of this incision at      night, given this is her main time when she is having difficulties.  2. If this does not prove to be successful, will consider Voltaren      patch versus gel to that area.  3. Consider physical therapy, more for desensitization in that area as      well as increasing exercise tolerance, although upper extremity      weights cannot be done secondary to her sternal instability.   I will see her back in about 2 weeks to see how she is doing with the  medication change.  Acupuncture may be a consideration should other  treatments fail.   Thank you very much for this interesting consultation.  I discussed this  case with Dr. Cornelius Moras via phone on the date of consultation, August 11, 2007.      Erick Colace, M.D.  Electronically Signed     AEK/MedQ  D:  08/11/2007 17:02:14  T:  08/12/2007 07:33:09  Job #:  [id number]

## 2011-01-22 NOTE — Assessment & Plan Note (Signed)
OFFICE VISIT   [name], [name]  DOB:  July 31, 1956                                        February 23, 2007  CHART #:  [id number]   SUBJECTIVE:  The patient returns for further followup today related to  chronic pain, status post sternal wound debridement with muscle flap  reconstruction in January 2008.  She was last seen here in the office on  February 13, 2007 by Dr. Donata Clay.  Since then, she has seen no improvement  in her chronic pain.  She continues to deny any fevers, chills, or  cough.  She has not had any shortness of breath.  Her pain is  exacerbated by motions and moving her arms and shoulders, and seems  clearly to be musculoskeletal in origin.  At times, she feels her  pectoralis muscles twitching.  The remainder of her review of systems is  unchanged.   PAST MEDICAL HISTORY:  The remainder of her past medical history is  unchanged.   PHYSICAL EXAMINATION:  General:  Notable for a well-appearing female.  Vital Signs:  Blood pressure 121/76, pulse 59, oxygen saturation 97%.  Chest:  Examination is notable for entirely benign physical exam with  completely healed skin incision.  There are no areas of palpable  crepitus or fluctuance to suggest fluid collection.  Some pain is  elicited on palpation of her chest wall.  Breath sounds are clear.  Cardiovascular:  Exam is notable for a regular rhythm.  The remainder of  her physical exam is unrevealing.   IMPRESSION:  Continued problems with chronic pain, status post  pectoralis major muscle flap reconstruction following deep sternal wound  infection with sternal debridement.   PLAN:  I have attempted to reassure the patient that usually, with time,  symptoms of pain abate, although it has already been 5 months and she  continues to have considerable trouble.  At some point, if she does not  see some improvement, she may need referral to a chronic pain  specialist.  She is scheduled to see Dr. Odis Luster  later this week for  further followup as well.  At this point, we will continue her fentanyl  patch 25 mcg per hour, with Ultram tablets as necessary for severe pain.  I have also given her a prescription for Neurontin 300 mg three times  daily to begin this week to see if this may assist with some of the  chronic pain.  This is typically more helpful for patients with  neuropathic pain, but in this situation, it may be of some benefit and  should be without any significant side effects.  We will have the  patient return for further followup in six weeks.  She has been given  100-tablet refill for her Ultram tablets today, and she has been given  10 additional Duragesic patches with refills available.   Salvatore Decent. Cornelius Moras, M.D.  Electronically Signed   CHO/MEDQ  D:  02/23/2007  T:  02/23/2007  Job:  [id number]

## 2011-01-22 NOTE — Assessment & Plan Note (Signed)
OFFICE VISIT   [name], [name]  DOB:  02/19/1956                                        Feb 06, 2007  CHART #:  [id number]   CURRENT PROBLEMS:  1. CABG, November 2007, by Dr. Tressie Stalker.  2. 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  3. Severe chest pain, unbearable, times 1 week.   PRESENT ILLNESS:  The patient presents to the office with assistance  with unbearable anterior chest pain for the past week.  She has had a  prior CABG with a sternal infection, debridement, and subsequent  pectoralis flap reconstruction.  She has been off antibiotics for well  over a month and has been undergoing rehab.  She has been more active  recently.  This past week she has had constant, unbearable pain which  has prevented her sleeping or performing normal daily activities.  She  has been taking tramadol with some assistance.  She denies fever.  She  denies redness, shortness of breath, or angina.   PAST MEDICAL HISTORY:  1. Hypertension.  2. Hyperlipidemia.  3. Sleep apnea.  4. History of tobacco abuse.   PHYSICAL EXAMINATION:  Her blood pressure is 130/80, pulse is 60,  respirations are 18, saturation 97%.  She is upset and anxious.  Breath  sounds are clear and equal.  The sternum has been debrided, but the  sternal incision is perfectly healed without any erythema.  She has  instability of the sternal edges with respiration or movement of the  trunk.  Cardiac rhythm is regular, there is no murmur or gallop.  Abdominal exam is soft.  Extremities are not cool or modeled.  Neurological exam is intact.   IMPRESSION/PLAN:  The patient has fairly atypical chest pain which  sounds musculoskeletal.  Reviewing her records, she was evaluated in  March by Dr. Cornelius Moras and was found to have similar pain, and a CT scan was  performed which showed proper healing of the mediastinum.  I feel  that a  repeat CT scan would be indicated to make sure there is no possibility  of occult infection or herniation of any mediastinal structures.  I  provided her with a prescription for some fentanyl patches, as she  states the oxycodone or hydrocodone have not helped or have, in fact,  made her anxious and worse.  I plan on seeing her back in 1 week with a  followup exam, and I told her not to do any upper extremity exercises or  lifting.  I suspect this may just be more soft tissue/musculoskeletal  pain from increased activity of her upper extremities and pulling on  some fresh scar tissue.   Kerin Perna, M.D.  Electronically Signed   PV/MEDQ  D:  02/06/2007  T:  02/06/2007  Job:  [id number]

## 2011-01-22 NOTE — Assessment & Plan Note (Signed)
[facility]                            CARDIOLOGY OFFICE NOTE   HILARY, MILKS                       MRN:          [id number]  DATE:03/17/2007                            DOB:          1956-03-27    Ms. Cavness is in for a followup visit.  She actually is doing reasonably  well.  She had a prolonged hospitalization for an infection in the  sternum.  She has slowly and gradually improved.  She says her heart  rate gets pretty slow.  She is not smoking whatsoever.  She denies any  ongoing chest pain.  She does have some soreness though.   MEDICATIONS:  Include:  1. Aspirin 81 mg daily.  2. Levothyroxine 125 mcg daily.  3. Furosemide 20 mg daily.  4. KCl 20 mg daily.  5. Metoprolol 25 mg b.i.d.  6. Zocor daily.  7. Fluoxetine 20 mg b.i.d.   PHYSICAL EXAMINATION:  Blood pressure 110/60.  The pulse is 48.  LUNG FIELDS:  Are actually quite clear.  The sternum is unchanged, but healing.   EKG reveals marked sinus bradycardia, nonspecific T wave abnormality.   IMPRESSION:  Coronary artery disease status post coronary artery bypass  graft surgery following stent restenosis.   PLAN:  1. Continue aspirin.  2. Check TSH.  3. Discontinue furosemide and KCl.  4. Decrease metoprolol to 12.5 mg p.o. b.i.d.  5. Check lipid and liver profile.  6. Return to clinic in 6 months.     Arturo Morton. Riley Kill, MD, [facility]  Electronically Signed    TDS/MedQ  DD: 03/17/2007  DT: 03/17/2007  Job #: [id number]

## 2011-01-22 NOTE — Assessment & Plan Note (Signed)
OFFICE VISIT   RAMANDA, PAULES  DOB:  April 13, 1956                                        February 13, 2007  CHART #:  [id number]   CURRENT PROBLEMS:  1. Coronary artery bypass graft in November 2007, by Dr. Tressie Stalker.  2. 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  3. Severe chest pain and problems with instability and clicking.   HISTORY OF PRESENT ILLNESS:  Ms. Osorto returns for her second recent  office visit for incisional pain and discomfort.  She was seen last  Friday with severe discomfort, very distraught and a CT scan was  performed to rule out recurrent deep infection.  The CT scan, in fact,  showed the deep structures to be healing well without any fluid or  evidence of recurrent infection.  She was given narcotics and Ultram and  she now feels better, but still has episodes where she has difficulty  laying down due to instability of the sternal edges with popping and  clicking.  She appears to be a better emotional state this week.   PHYSICAL EXAMINATION:  VITAL SIGNS:  Blood pressure 110/70, pulse 60,  respirations 18, saturations 98%.  GENERAL:  She is alert and fairly pleasant this week.  CHEST:  The incision remains entirely benign and appears to be healing  well.  With breathing, you can feel some clicking of the sternal edge on  the left side of her thorax.  CARDIAC:  Regular rhythm without rub or gallop with no peripheral edema.   IMPRESSION/PLAN:  I told Ms. Engelbrecht that these problems will improve as  the site of reconstruction scars in.  The muscle flaps were performed in  February, and will need at least 6 months to scar and become more  stable.  One additional intervention to help the patient would be to use  a sternal brace apparatus which has been used in the past by our  practice.  This could be placed at  night and may help her symptoms when  she is supine in bed.  At this point, our office does not have one of  these items in stock, however, our cardiac surgery nurse, Julious Payer, who has been involved with this program in the past, have some  in her possession and we will try to obtain one of these braces in our  office so Ms. Dorfman can try that at her next visit later this month.  In the meantime, I have provided her with additional prescription for  Ultram and a Fentanyl patch and she plans on returning in approximately  3 weeks.   Kerin Perna, M.D.  Electronically Signed   PV/MEDQ  D:  02/13/2007  T:  02/14/2007  Job:  [id number]

## 2011-01-23 ENCOUNTER — Encounter: Payer: PRIVATE HEALTH INSURANCE | Attending: Physical Medicine & Rehabilitation | Admitting: Neurosurgery

## 2011-01-23 DIAGNOSIS — I6330 Cerebral infarction due to thrombosis of unspecified cerebral artery: Secondary | ICD-10-CM

## 2011-01-23 DIAGNOSIS — Z79899 Other long term (current) drug therapy: Secondary | ICD-10-CM | POA: Insufficient documentation

## 2011-01-23 DIAGNOSIS — I2510 Atherosclerotic heart disease of native coronary artery without angina pectoris: Secondary | ICD-10-CM | POA: Insufficient documentation

## 2011-01-23 DIAGNOSIS — G4733 Obstructive sleep apnea (adult) (pediatric): Secondary | ICD-10-CM | POA: Insufficient documentation

## 2011-01-23 DIAGNOSIS — G894 Chronic pain syndrome: Secondary | ICD-10-CM

## 2011-01-23 DIAGNOSIS — I69959 Hemiplegia and hemiparesis following unspecified cerebrovascular disease affecting unspecified side: Secondary | ICD-10-CM | POA: Insufficient documentation

## 2011-01-24 NOTE — Assessment & Plan Note (Signed)
[name] is a patient who is scheduled to see Dr. Wynn Banker following a CVA in February 2012.  She is scheduled for Feb 03, 2011.  She states she was put on unscheduled day for a quick check status post TIA she had this past Saturday.  She was hospitalized for the day but not overnight.  The patient has no real pain associated with it.  She states her pain level is about 2-3 on average, describes what pain as she does have a sharp in nature.  General activity level is not really affected, problems are worse at night.  She is not sure what aggravates things, rest tend to help.  Mobility, she does carry a cane.  She appears to ambulate independently today.  She can walk for about 30 minutes or so without any difficulty.  She can climb steps.  She does not drive.  She is on disability.  REVIEW OF SYSTEMS:  Notable for those problems as well some respiratory infection, shortness of breath, sleep apnea, cough, fever and chills, anxiety, depression.  She does have some numbness from time to time.  PRIMARY CARE DOCTOR:  Neta Mends. Fabian Sharp, MD and Arturo Morton. Riley Kill, MD, [facility] has worked with her as well.  PAST MEDICAL HISTORY:  Significant for heart problems, lung problems, stroke, thyroid, and high blood pressure.  She had a CABG in 2007. Chest wall problem in 2008.  SOCIAL HISTORY:  She is single, divorced, lives alone.  FAMILY HISTORY:  Significant for heart disease, lung disease, diabetes, hypertension, psychiatric problems.  PHYSICAL EXAMINATION:  VITAL SIGNS:  Blood pressure 147/80, pulse 60, respirations 18, O2 sats 97 on room air. GENERAL:  She appears to be alert and oriented x3.  Her affect is bright.  She is constitutionally within normal limits. NEUROLOGIC:  Shows her extraocular movements to be intact.  Rapid arm movement is noticed.  Finger-to-nose is adequate although she is somewhat tremor on the left.  Her grip strength and intrinsics are about 4/5 on the left and 4+/5  on the right.  Her speech appears to be clear and fluent, but she does have some left-sided facial weakness as residual from the February 2012 stroke.  She does not have any word- finding difficulties.  She is not aphasic at all.  She does not have any dysphagia.  As described, she was placed on Plavix.  She states she had taken herself off blood thinners which may what caused the TIA.  She is to stay on her Plavix religiously.  She is taking aspirin also  ASSESSMENT:  Status post cerebrovascular accident in February 2012, with transient ischemic attack, resulting this past Saturday.  She was not hospitalized overnight.  PLAN:  She will continue her Plavix.  She is going to follow up with Dr. Wynn Banker on Feb 03, 2011.  If she notices any difficulty, she is to go back to the emergency room and contact primary care doctor emergently. Her questions were encouraged and answered.  No prescriptions were given.     Brian Zeitlin L. Blima Dessert Electronically Signed    RLW/MedQ D:  01/23/2011 12:27:23  T:  01/23/2011 23:56:44  Job #:  [id number]

## 2011-01-25 NOTE — H&P (Signed)
NAMESHALEE, PAOLO NO.:  [id number]   MEDICAL RECORD NO.:  [id number]          PATIENT TYPE:  EMS   LOCATION:  MAJO                         FACILITY:  [facility]   PHYSICIAN:  Salvadore Farber, M.D. LHCDATE OF BIRTH:  1955-12-14   DATE OF ADMISSION:  11/05/2005  DATE OF DISCHARGE:                                HISTORY & PHYSICAL   PHYSICIANS:  Primary care physician: Neta Mends. Panosh, M.D.  Primary cardiologist: Arturo Morton. Riley Kill, M.D.   CHIEF COMPLAINT:  Chest pain and shortness of breath.   HISTORY OF PRESENT ILLNESS:  Ms. Latorre is a 55 year old female with no  known history of coronary artery disease.  She describes increasing  shortness of breath over the last 2 days.  Normally when she gets these  symptoms, they are associated with edema, and she treats them with p.r.n.  Lasix.  Today she was in a meeting and developed left-sided chest pain that  was associated with diaphoresis as well as shortness of breath but no nausea  or vomiting.  It started between 8 and 9 a.m. today.  It got worse after  noon and reached a 5/10.  She was at work, but when she told them she was  having chest pain, the sent her to Urgent Care.  She received oxygen and  aspirin there, and her symptoms resolved.  She is still short of breath with  exertion, but on O2 she is not short of breath at rest.   Ms. Regal has a history of episodic shortness of breath that improves with  diuretics. She had Lasix which she planned to take today but had not done so  as yet.  She has never had chest pain before today except for occasionally  she gets a tingling or fluttering substernal chest pain at night.  She has  never been evaluated by catheterization. She is currently symptoms free.   PAST MEDICAL HISTORY:  She has no history of diabetes but has a history of  hypertension, untreated hyperlipidemia, ongoing tobacco use, family history  of coronary artery disease and obesity.  She also has a  history of COPD,  obstructive sleep apnea, depression, gastroesophageal reflux disease, and in  2003 she had an abnormal chest CT without contrast for which followup was  recommended, but I do not see further imaging studies.  There was a question  of sarcoid.   PAST SURGICAL HISTORY:  She has had bilateral knee arthroscopes as well as  bladder tack, mastoidectomy, and a thyroid nodule biopsy.   ALLERGIES:  AMOXICILLIN and SULFA.   CURRENT MEDICATIONS:  1.  Metoprolol 25 mg daily (prescribed twice daily).  2.  Levothyroxine 100 mcg a day.  3.  Albuterol MDI.   Of note, she states that she has been on Effexor in the past but could not  afford it, so she stopped it 4 to 6 weeks ago.   SOCIAL HISTORY:  She lives in [place] with two sons and her mother.  She  is a Recruitment consultant.  She has approximately a 25-pack-year history of  tobacco  use.  Does not abuse alcohol or drugs.   FAMILY HISTORY:  Her mother is alive at age 32 and has had bypass surgery.  Her father died at age 28 with an MI.  She has one sibling without heart  disease.   REVIEW OF SYSTEMS:  Positive for the chest pain, shortness of breath, as  well as dyspnea on exertion, orthopnea, PND, and lower extremity edema.  She  has a nonproductive cough. She is having problems with depression. She has a  history of stress incontinence.  She has occasional diarrhea which has been  worse recently. She has had no reflux symptoms recently.  She has  arthralgias.  Review of Systems is otherwise negative.   PHYSICAL EXAMINATION:  VITAL SIGNS:  Temperature 98.6, blood pressure  138/80, pulse 62, respiratory rate 20, O2 saturation 100% on 2 liters.  GENERAL: Well-developed, obese white female in no acute distress.  HEENT: Her head is normocephalic and atraumatic.  Pupils equal, round, and  reactive to light and accommodation.  Extraocular movements intact.  Sclerae  clear. Nares without discharge.  NECK:  Supple and  without lymphadenopathy, thyromegaly, or bruit.  JVD is  difficult to assess secondary to body habitus.  CARDIOVASCULAR:  Her heart is regular in rate and rhythm with an S1 and S2  and a 2/6 systolic ejection murmur at the left upper sternal border.  Distal  pulses are 2+, and no femoral bruits are appreciated.  LUNGS:  She has some rales in the bases but are otherwise clear.  SKIN: No rashes or lesions are noted.  ABDOMEN:  Soft and nontender with active bowel sounds.  No  hepatosplenomegaly is noted.  EXTREMITIES:  She has 1 to 2+ lower extremity edema.  MUSCULOSKELETAL: She has no joint deformity or effusion and no spine or CVA  tenderness.  NEUROLOGIC: She is alert and oriented.  Cranial nerves II-XII grossly  intact.   Chest x-ray performed at Urgent Care reportedly shows edema.   EKG: Sinus rhythm, rate 62 with inferolateral T wave changes that, by  description, are similar to a prior EKG.   LABORATORY DATA:  CK-MB and troponin I negative x1.  Sodium 140, potassium  3.9, chloride 108, BUN 12, creatinine 0.7, glucose 98.  Hemoglobin 13.5,  hematocrit 38.4, WBC 7.2, platelets 235.   IMPRESSION:  Ms. Candy is a 55 year old female with a longstanding history  of hypertension as well as ongoing tobacco use, family history of premature  atherosclerotic disease.  She has a history of an abnormal electrocardiogram  and questionable hypertrophic cardiomyopathy with minimal anteroseptal  hypertrophy but no systolic anterior motion.  She now presents with several  months of dyspnea and new substernal chest pain today.  Examination is  remarkable for bibasilar rales and 2+ dorsalis pedis pulses.  No murmurs  appreciated.  Electrocardiogram shows lateral ST and T wave changes.  Chest  x-ray shows hilar prominence and interstitial prominence.   The impression is possible unstable angina versus anxiety in the setting of hypertensive cardiomyopathy.  Chest x-ray raises concern for  sarcoid.   PLAN:  1.  The plan is, therefore, right and left heart catheterization tomorrow.  2.  CT of the chest will be performed without contrast.  3.  We will check a BMP as well as ACE level.  4.  Cardiac enzymes will be cycled to rule out myocardial infarction.  5.  Further evaluation and treatment will depend on the results of the above  testing.   This is Theodore Demark, P.A.-C. dictating for Salvadore Farber, M.D., who  saw the patient and determined the plan of care.      Theodore Demark, P.A. [facility]      Salvadore Farber, M.D. [facility]  Electronically Signed    RB/MEDQ  D:  11/05/2005  T:  11/05/2005  Job:  [id number]

## 2011-01-25 NOTE — Cardiovascular Report (Signed)
[name], [name] NO.:  [id number]   MEDICAL RECORD NO.:  [id number]          PATIENT TYPE:  INP   LOCATION:  3732                         FACILITY:  [facility]   PHYSICIAN:  Arturo Morton. Riley Kill, M.D. 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 She has had a previous history of some left ventricular  hypertrophy with some proximal septal thickening. She also had an abnormal  CT and has a history of sleep apnea with wearing a mask. She has continued  to smoke since that time. She has had some episode of chest pain and was  admitted. She had a CT which did not demonstrate progression of her  pulmonary findings. Cardiac catheterization was recommended. She was brought  to the catheterization laboratory after being evaluated by Dr. Samule Ohm.   PROCEDURES:  1.  Right and left heart catheterization.  2.  Selective coronary arteriography.  3.  Selective left ventriculography in biplane.   DESCRIPTION OF PROCEDURE:  The patient was brought to the catheterization  laboratory and prepped and draped in the usual fashion. Through an anterior  puncture, using a Smart needle, the femoral vein was entered. A 7-French  sheath was placed. We then did a superior vena cava saturation. Sequential  right heart pressures were then obtained and a pulmonary artery saturation  was obtained. Thermodilution cardiac outputs were performed. Following this,  the femoral artery was entered and 6-French sheath was placed using a Smart  needle. The central aortic and left ventricular pressures were measured with  a pigtail catheter. Simultaneous wedge LV was performed. Saturations were  obtained in the systemic circuit. Ventriculography was then performed both  in the RAO and LAO projections. Following this, we did  simultaneous RV/LV to  document that they were not equivalent pressure. After removal of the LV and  RV catheters, coronary arteriography was then performed in multiple  angiographic views. Other than her back bothering her, the patient tolerated  the procedure reasonably well and was taken to the holding area in  satisfactory clinical condition.   HEMODYNAMIC DATA:  1.  Right atrial pressure 16.  2.  RV 54/19.  3.  54/30.  4.  Pulmonary capillary wedge 29.  5.  Aortic 169/93, mean 123.  6.  LV 171/26.  7.  No gradient on pullback across the aortic valve.  8.  Aortic saturation 97%.  9.  Superior vena cava saturation 59%.  10. Pulmonary artery saturation 58%.  11. Thermodilution cardiac output 4.2 liters per minute.  12. Thermodilution cardiac index 2.06 liters per minute per meter squared.   ANGIOGRAPHIC DATA:  1.  Ventriculography was done in the RAO and LAO projections. Ejection      fraction was greater than 60%. There does not appear to be midsystolic      closure in the left ventricular outflow tract. Significant mitral      regurgitation also does not appear to be  present.  2.  Left main is free of critical disease.  3.  The LAD courses to the apex. The LAD itself was somewhat diffusely      diseased. There is 80% focal stenosis prior to the bifurcation of the      diagonal and the LAD. The diagonal itself also has a fair amount luminal      irregularity.  4.  The circumflex provides a first marginal that is without critical      narrowing. The AV circumflex has about 50% narrowing prior to takeoff of      the small marginal branch and 70% just after supplying a smaller      posterolateral branch.  5.  The right coronary is large dominant vessel with about 30% proximal      narrowing. There is a 75% area of focal stenosis in the midportion of      the posterior descending branch.   CONCLUSION:  1.  Preserved overall left ventricular systolic function by contrast       angiography.  2.  Pulmonary hypertension with elevated left ventricular end-diastolic      pressure.  3.  No evidence of shunt.  4.  Coronary artery disease with an 80% focal stenosis of the proximal left      anterior descending artery and a 75% stenosis in the midposterior      descending artery.   DISPOSITION:  We will review the various options. The patient likely will  need percutaneous intervention. She absolutely needs to stop smoking.  Aggressive management of her sleep apnea is warranted in light of her  elevated left heart pressures.      Arturo Morton. Riley Kill, M.D. [facility]  Electronically Signed     TDS/MEDQ  D:  11/06/2005  T:  11/06/2005  Job:  [id number]   cc:   Joni Fears D. Maple Hudson, M.D.  [facility]  [address]  [place]  [place] [address]   Neta Mends. Fabian Sharp, M.D. [facility]  [address] [place]  [place] [address]   [facility]   Patient's chart

## 2011-01-25 NOTE — Discharge Summary (Signed)
[name], [name] NO.:  [id number]   MEDICAL RECORD NO.:  [id number]          PATIENT TYPE:  INP   LOCATION:  2306                         FACILITY:  [facility]   PHYSICIAN:  Arturo Morton. Riley Kill, MD, FACCDATE OF BIRTH:  12/23/1955   DATE OF ADMISSION:  06/13/2006  DATE OF DISCHARGE:  06/17/2006                                 DISCHARGE SUMMARY   PRIMARY CARE PHYSICIAN:  Neta Mends. Panosh, M.D.   PRIMARY CARDIOLOGIST:  Dr. Shawnie Pons.   PRINCIPAL DIAGNOSIS:  Unstable angina and coronary artery disease.   SECONDARY DIAGNOSES:  1. Hypertension.  2. Hyperlipidemia.  3. Obesity.  4. Obstructive sleep apnea.  5. Pulmonary hypertension.  6. Hypothyroidism.  7. Ongoing tobacco abuse.  8. COPD.   ALLERGIES:  AMOXICILLIN.   PROCEDURE:  Left heart cardiac catheterization and successful PCI and  cutting balloon angioplasty of in-stent restenosis in the proximal LAD   HISTORY OF PRESENT ILLNESS:  A 55 year old Caucasian female with prior  history of CAD status post stenting of the LAD with Express II bare metal  stent in February 2007 who was in usual state of health until approximately  3-4 weeks prior to admission when she began to experience daily 2-3/10  exertional substernal chest pressure and tightness associated with shortness  of breath and resolving with rest after approximately 1 to 10 minutes or  with sublingual nitroglycerin immediately when taken.  Symptoms were felt to  be similar to previous angina, and she was seen our office on October 5, and  decision was made to admit her from the office for left heart cardiac  catheterization.   HOSPITAL COURSE:  Ms. Trani was admitted from the office and underwent left  heart cardiac catheterization on October 5 revealing 70-80% in-stent  restenosis within the proximal LAD stent as well as a 60% stenosis in the  first diagonal, a 70% stenosis in the mid left circumflex, and a 75%  stenosis in the distal RCA.   EF was normal.  It was felt that she was both a  candidate for redo PCI within the LAD stent as well as bypass surgery given  multivessel disease. Films were reviewed with Dr. Tyrone Sage, from [facility], who  agreed that she would be a candidate for bypass surgery if that were what  she opted for.  After further discussion with the patient, the decision was  made to pursue one-vessel PCI, and she was taken back to the cardiac  catheterization lab on October 8 and underwent successful cutting balloon  angioplasty within the previously placed LAD stent with good results.  She  did have some postprocedural chest pain without any ECG changes as well as  brief episode of mild hypotension and bradycardia that responded well to  atropine.  This morning she has been ambulating without recurrent  discomfort, hypertension or bradycardia and is being discharged home today  in satisfactory condition.   DISCHARGE LABORATORY DATA:  Hemoglobin 12.2, hematocrit 35.5, WBC 5.6,  platelets 196, MCV 96.5.  Sodium 137, potassium 4.1, chloride 104, CO2 25,  BUN 9, creatinine 0.8, glucose  108, PT 13.6, INR 1.0, PTT 32. Total  bilirubin 0.4, alkaline phosphatase 59, AST 16, ALT 17. Albumin 3.6. CK 63,  MB 1.5. Calcium 8.6.  TSH 2.057. Hemoglobin A1c 5.7.   DISPOSITION:  The patient is being discharged home today in good condition.   FOLLOWUP APPOINTMENTS:  She will have followup appointment with Dr.  [name]. on November 5 at 9:15 a.m.   DISCHARGE MEDICATIONS:  1. Metoprolol 25 mg b.i.d.  2. Synthroid 0.1250 mg daily.  3. Lasix 20 mg daily.  4. Zocor 40 mg daily.  5. Zetia 10 mg daily.  6. Potassium chloride 20 mEq daily.  7. Prozac 20 mg nightly.  8. Aspirin 325 mg daily.  9. Chantix as previously prescribed.  10.Plavix 75 mg daily.  11.Nitroglycerin 0.4 mg sublingual p.r.n. chest pain.   PENDING LABORATORY STUDIES:  None.   Duration of discharge encounter:  40 minutes including physician  time.     ______________________________  Nicolasa Ducking, ANP      Arturo Morton. Riley Kill, MD, [facility]  Electronically Signed    CB/MEDQ  D:  06/17/2006  T:  06/18/2006  Job:  [id number]   cc:   Neta Mends. Fabian Sharp, MD

## 2011-01-25 NOTE — Cardiovascular Report (Signed)
NAMECHANTEL, [name] NO.:  [id number]   MEDICAL RECORD NO.:  [id number]          PATIENT TYPE:  INP   LOCATION:  4733                         FACILITY:  [facility]   PHYSICIAN:  Veverly Fells. Excell Seltzer, MD  DATE OF BIRTH:  17-Jun-1956   DATE OF PROCEDURE:  DATE OF DISCHARGE:  07/04/2006                              CARDIAC CATHETERIZATION   DATE OF BIRTH:  Jan 28, 1956   PROCEDURE:  Left heart catheterization, selective coronary angiography, left  ventricular angiography.   INDICATION:  Stable angina.  [name] is a very pleasant 55 year old woman  with triple vessel coronary artery disease.  She has been treated with a  bare-metal stent in her LAD and has required cutting balloon angioplasty for  in-stent restenosis.  She has moderate disease in her left circumflex system  and right coronary artery system.  She has been treated medically for those  lesions.  She presents today for persistent symptoms of chest pain and  fatigue.   PROCEDURAL DETAILS:  Risks and indications of the procedure were explained  in detail to the patient.  Informed consent was obtained.  The right groin  was prepped, draped, and anesthetized with 1% lidocaine.  Using the modified  Seldinger technique, a 6-French sheath was placed in the right femoral  artery.  Multiple angiographic views of the left and right coronary arteries  were taken.  For the left coronary artery, a 6-French JL4 catheter was used.  For the right coronary artery a 6-French JR4 catheter was used.  Following  selective coronary angiography, an angled pigtail catheter was placed in the  left ventricle and left ventricular pressures were recorded.  A 30-degree  RAO left ventriculogram was performed.  Following left ventriculography, a  pullback across the aortic valve was performed.  All catheter exchanges were  performed over a guide wire.   FINDINGS:  Aortic pressure 109/54 with a mean of 78.  Left ventricular  pressure 110/7 with an end-diastolic pressure of 22.   The left main stem has nonobstructive plaque.  It trifurcates into the LAD,  ramus intermedius, and left circumflex.  The LAD is a medium caliber vessel  that courses down to the left ventricular apex.  There is a stent in the  proximal LAD that is patent.  There is mild in-stent restenosis in the  proximal portion of the stent.  It does not appear obstructive.  There is a  diagonal branch originating from the proximal region of the stent that has  an ostial stenosis in the range of 50% to 70%.  The diagonal branch is small  in diameter.  The remainder of the LAD has mild diffuse disease that is  nonobstructive.  The ramus intermedius is a large diameter vessel and has  diffuse nonobstructive disease.   The left circumflex system is small caliber.  There are serial lesions in  the mid left circumflex, with the most proximal lesion 60% and the more  distal lesion 70% to 75%.  The vessel appears to be 2 mm in diameter.   The right coronary artery is  dominant.  There is a proximal 30% stenosis  that is nonobstructive.  It gives off an RV marginal branch in its mid  portion.  Distally, it bifurcates into the PDA and posterior AV segment.  The posterior AV segment gives off 2 posterolateral branches and the AV  nodal artery.  The posterolateral branches have diffuse disease.  The  posterior AV segment has a 30% lesion.  The PDA has a 70% lesion that  appears unchanged from prior angiogram.   The left ventriculogram performed in the 30-degree RAO projection shows  hyperdynamic left ventricular function with an estimated left ventricular  ejection fraction of 75%.   ASSESSMENT:  1. Moderate 3-vessel coronary artery disease with a patent stent in the      proximal left anterior descending.  2. Hyperdynamic left ventricular function.   PLAN:  Recommend continuation of medical therapy.  The patient does not have  any critical stenoses.   She has moderate disease in a small circumflex, as  well as her PDA.  Her LAD stent is patent.  She would be a candidate for PCI  of her PDA lesion.  Her left circumflex lesion could be treated as well, but  it would require a small diameter, fairly long, bare-metal stent, which  would not be optimal.  I reviewed her case with Dr. Riley Kill and we have  elected to increase her antianginal medications.  She will continue on  aspirin and Plavix.      Veverly Fells. Excell Seltzer, MD  Electronically Signed     MDC/MEDQ  D:  07/04/2006  T:  07/05/2006  Job:  [id number]   cc:   Arturo Morton. Riley Kill, MD, [facility]  Neta Mends. Fabian Sharp, MD

## 2011-01-25 NOTE — Letter (Signed)
August 06, 2006     RE:  [name], [name]  MRN:  [id number]  /  DOB:  1956/07/04   To Whom It May Concern,   [name] is a patient under my care.  She has coronary artery disease  and is undergoing coronary artery bypass graft surgery.  With this, she  will need to be out of work and more than likely, this will require  treatment until March 1.  We will keep you updated.  She will be  completely and totally disabled until that time.    Sincerely,      Arturo Morton. Riley Kill, MD, [facility]  Electronically Signed    TDS/MedQ  DD: 08/06/2006  DT: 08/06/2006  Job #: [id number]

## 2011-01-25 NOTE — Discharge Summary (Signed)
[name], [name]                ACCOUNT NO.:  [id number]   MEDICAL RECORD NO.:  [id number]          PATIENT TYPE:  INP   LOCATION:  2019                         FACILITY:  [facility]   PHYSICIAN:  Salvatore Decent. Cornelius Moras, M.D. DATE OF BIRTH:  06-Dec-1955   DATE OF ADMISSION:  08/06/2006  DATE OF DISCHARGE:                               DISCHARGE SUMMARY   ADMIT DIAGNOSIS:  Multivessel coronary artery disease.   PAST MEDICAL HISTORY AND DISCHARGE DIAGNOSES:  1. Hypertension.  2. Hyperlipidemia.  3. Obstructive sleep apnea.  4. Asthma.  5. Long-standing tobacco abuse.  6. Hernia repair.  7. Bladder suspension.  8. Left ear surgery.  9. Bilateral knee arthroscopy.  10.Coronary artery disease status post percutaneous intervention with      stenting x2 of the left anterior descending, status post coronary      artery bypass grafting x4.   ALLERGIES:  SULFA.   BRIEF HISTORY:  The patient is a 55 year old obese Caucasian female with  a known history of coronary artery disease who originally presented in  February 2007 with symptoms of chest pain consistent with angina.  She  underwent cardiac cath by Dr. Riley Kill on November 06, 2005, and was  found to have an 80% stenosis of her LAD with three-vessel coronary  artery disease and a normal left ventricular function.  She was also  noted to have significant pulmonary hypertension.  She underwent  percutaneous coronary intervention and stenting of the LAD, utilizing a  nondrug-eluting stent.  The patient reports that she initially did well  after this intervention.  However, over the last 2 to 3 months, she has  had recurrent symptoms of exertional chest tightness and shortness of  breath.  She returned to Dr. Riley Kill and underwent repeat cardiac cath  in early October at which time she was found to have persistent three-  vessel coronary artery disease with some in-stent restenosis of the LAD.  She underwent rotablator atherectomy and repeat  percutaneous  intervention of the LAD.  After that time, she reports that her symptoms  did not improve.  A followup cath was also performed in late October,  confirming the persistence of significant in-stent restenosis of the LAD  as well as three-vessel coronary artery disease and a normal left  ventricular function.  Secondary to these findings, the patient was  referred to Dr. Tressie Stalker of the [facility] Service regarding surgical  revascularization.  Dr. Cornelius Moras evaluated the patient.  It was his opinion  that the patient should proceed with coronary artery bypass grafting.   The patient was admitted and came to the OR on August 06, 2006, for  coronary artery bypass grafting x4.  The left internal mammary artery  was grafted to the LAD, saphenous vein was grafted to the PD, and  saphenous vein was grafted in sequence to the distal circumflex and  intermediate.  Endoscopic vessel harvesting was performed on the right  lower extremity.  The patient tolerated the procedure well and was  hemodynamically stable immediately postoperatively.  The patient was  transferred from the OR to the SICU  in stable condition.  The patient  was extubated without complication and woke up from anesthesia  neurologically intact.   On postoperative day one , the patient's only complaint was of soreness  in her chest and right shoulder.  She was breathing comfortably.  She  was afebrile with stable vital signs and AAI-paced at 70.  Her chest x-  ray was clear.  She was doing well on postoperative day one.  All  invasive lines and chest tubes were discontinued in a routine manner,  and she tolerated this well.  The patient has been significantly volume  overloaded postoperatively and has been diuresed accordingly.   The patient began cardiac rehab on postoperative day one and has  increased her tolerance to a satisfactory level at this time.  The  patient was noted to have an acute blood loss anemia  postoperatively,  for which she was started on iron and folic acid.  This has stabilized.  On postoperative day four, the patient is without complaint.  Her bowel  function has yet to return, but she was given a suppository.  She is  afebrile with stable vital signs and maintaining a normal sinus rhythm.   On physical exam, cardiac is regular rate and rhythm.  There are  expiratory wheezes present bilaterally on the lung exam with decreased  breath sounds in the left base.  Abdomen is benign.  The incisions are  clean, dry, and intact, and there is edema present in the bilateral  lower extremities.   The patient is in stable condition at this time and as long as she  continues to progress in the current manner, she will be ready for  discharge home within the next one to two days pending morning round  reevaluation.   LABORATORY:  CBC on August 09, 2006:  White count 8.6, hemoglobin 8.7,  hematocrit 25.2, platelets 232.  BMP on August 09, 2006:  Sodium 135,  potassium 3.9, BUN 11, creatinine 0.7, glucose 105.   CONDITION ON DISCHARGE:  Improved.   INSTRUCTIONS:  Medications:  1. Aspirin 325 mg daily.  2. Toprol XL 25 mg daily.  3. Vytorin 10/40 mg daily.  4. Synthroid 125 mcg daily.  5. Prozac 10 mg b.i.d.  6. Lasix 40 mg daily x7 days.  7. K-Dur 20 mEq daily x7 days.  8. Pulmicort 0.5 mg every 12 hours.  9. Iron 325 mg daily.  10.Folic acid 1 mg daily.  11.Tylox one to two every 4-6 hours p.r.n. pain.   The patient received specific instructions regarding activity, diet, and  wound care.   Followup appointment with Dr. Riley Kill.  The patient was instructed to  contact his office for an appointment two weeks after discharge, at  which time a PA and lateral chest x-ray will be taken.   Dr. Cornelius Moras three weeks after discharge; the CVTS office will arrange the date and time of this appointment for the patient and contact her with  this information.      Pecola Leisure,  PA      Salvatore Decent. Cornelius Moras, M.D.  Electronically Signed    AY/MEDQ  D:  08/10/2006  T:  08/11/2006  Job:  [id number]   cc:   Salvatore Decent. Cornelius Moras, M.D.  Tina Morton. Riley Kill, MD, [facility]

## 2011-01-25 NOTE — Discharge Summary (Signed)
NAMEMASHELL, SIEBEN NO.:  [id number]   MEDICAL RECORD NO.:  [id number]          PATIENT TYPE:  INP   LOCATION:  2019                         FACILITY:  [facility]   PHYSICIAN:  Evelene Croon, M.D.     DATE OF BIRTH:  08-03-1956   DATE OF ADMISSION:  09/14/2006  DATE OF DISCHARGE:  10/01/2006                               DISCHARGE SUMMARY   HISTORY OF PRESENT ILLNESS:  The patient is a 55 year old female who  underwent coronary artery bypass grafting x4 by Dr. Cornelius Moras August 06, 2006.  Her initial postoperative course was uncomplicated.  She saw Dr.  Cornelius Moras in the office a week or two ago, and at the time there was some  drainage from the inferior aspect of her sternal incision, and she was  treated with local peroxide.  Over the previous 48 hours prior to  admission, she noted increasing pain and drainage of the area.  She then  measured her temperature of 99.5 but did not have any shaking chills or  sweats.  She presented to the emergency room on the day of admission and  was felt to require admission for further evaluation and treatment  including pain control and management of her wound.   PAST MEDICAL HISTORY:  Significant for:  1. Coronary artery disease status post coronary artery bypass      grafting.  2. Hypertension.  3. Hyperlipidemia.  4. Asthma.  5. Obstructive sleep apnea.  6. Tobacco abuse.  7. Hernia repair.  8. Bladder suspension.  9. Knee arthroscopies.   MEDICATIONS PRIOR TO ADMISSION.:  1. Simvastatin 40 mg daily,  2. Aspirin 325 mg daily.  3. Levothyroxine 125 mcg daily.  4. Lasix 20 mg daily.  5. Potassium chloride 10 mEq daily.  6. Zetia 10 mg daily.  7. Metoprolol 25 mg b.i.d.  8. Fluoxetine 20 mg b.i.d.   ALLERGIES:  PENICILLIN and SULFA.   FAMILY HISTORY SOCIAL HISTORY REVIEW OF SYSTEMS AND PHYSICAL  EXAMINATION:  Please see the History and Physical done at time of  admission.   HOSPITAL COURSE:  The patient was admitted, had  increasing pain and  drainage from the inferior aspect of her sternal wound and was felt to  require aggressive intravenous antibiotics as well as pain control and  local wound care.  It was initially hoped that she could be managed  conservatively.  However, she did show worsening evidence of infection  in the sternal wound.  She ultimately was felt to require exploration  for debridement of the sternal wound, and this was performed on September 17, 2006, by Dr. Laneta Simmers.  Findings included pus and necrotic debris  extending from the bottom of the sternum on the top of the sternum.  There was also pus beneath the sternum in the mid and lower portions.  The three lower-most sternal wires were noted to have been pulled  through the sternum.  The wound was packed with saline, and the patient  was started on a course of VAC dressing changes.  The patient was  treated aggressively with intravenous antibiotics,  and infectious  disease consultation was obtained.  The patient was found to have  methicillin-sensitive staph aureus.  The patient was initially started  on vancomycin.  However, the patient developed leukopenia and  thrombocytopenia, and this was subsequently changed Avelox.  The patient  then developed an acute delirium, and it was uncertain as the exact  etiology, and she is known to have depression and anxiety.  However, due  to his, the Avelox was discontinued, and the patient was started Cubicin  intravenously.  The patient was also seen during this acute delirium by  psychiatry, and medications were given, and she has had a complete  resolution to her baseline mental status.   The patient was monitored and treated aggressively with wound care;  however, it ultimately was deemed that the patient would require muscle  flaps, and she was seen in plastic surgery consultation by Dr. Odis Luster.  The patient subsequently was felt to require this procedure, and on  September 23, 2006, she  underwent #1, sternal debridement; #2, bilateral  pectoralis muscle flaps.  The patient tolerated this procedure well and  was taken to the postanesthesia care unit in stable condition.   Second procedure postoperative course:  The patient has done well.  She  was continued to be managed closely with both infectious disease and  plastic surgery monitoring her wound and antibiotic care.  The patient  did develop a urinary tract infection but has been placed  postoperatively on Cipro for this, and this will be continued as an  outpatient.  Additionally, the patient did require a course of Diflucan  for a yeast infection associated with her urinary tract.  She overall is  felt to be stable, and discharge is currently pending.  Her current  medical status is felt to be fairly stable, although she is requiring  aggressive pulmonary toilet and is having frequent coughing spells and  is still requiring nebulized treatments.  Overall, the patient is felt  to be stable, and tentatively the discharge will be on October 01, 2006,  pending morning round reevaluation.   MEDICATIONS CURRENTLY AT THE TIME OF DISCHARGE:  1. Diflucan 100 mg daily until October 03, 2006.  2. Beneprotein powder one scoop three times daily with Ensure or other      liquid.  3. Seroquel 50 mg b.i.d.  4. Bacitracin ointment to sternal wound daily.  5. She is to continue Levothyroxine 125 mcg daily.  6. Zetia 10 mg daily.  7. Simvastatin 40 mg daily.  8. Metoprolol 25 mg daily.  9. Metoprolol 25 mg b.i.d.  10.Fluoxetine 20 mg b.i.d.  11.Lasix 20 mg daily.  12.Potassium chloride 10 mEq daily.  13.Cipro 400 mg IV twice daily through October 21, 2006, then 500      b.i.d. for 1 month p.o.   FOLLOWUP:  With Dr. Cornelius Moras, Dr. Odis Luster, Dr. Roxan Hockey, and psychiatry.  This will be determined at time of discharge.  Tentatively, Dr. Odis Luster will see the patient on Monday, October 06, 2006.   CONDITION ON DISCHARGE:  Stable,  improved.   INSTRUCTIONS:  The patient received written instructions regarding  medications, activity, diet, wound care followup.   FINAL DIAGNOSES:  1. Status post sternal debridement and muscle flap as described above.  2. Urinary tract infection.  3. Urinary yeast infection.  4. Coronary artery disease status post coronary artery bypass      grafting.  5. Hypertension.  6. Hyperlipidemia.  7. Asthma.  8. Obstructive sleep apnea.  9. Tobacco abuse.  10.Hernia repair.  11.Bladder suspension.  12.Knee arthroscopies.      Rowe Clack, P.A.-C.      Evelene Croon, M.D.  Electronically Signed    WEG/MEDQ  D:  09/30/2006  T:  09/30/2006  Job:  [id number]   cc:   Etter Sjogren, M.D.  Antonietta Breach, M.D.  Rockey Situ. Flavia Shipper., M.D.

## 2011-01-25 NOTE — H&P (Signed)
NAMESURINA, STORTS                ACCOUNT NO.:  [id number]   MEDICAL RECORD NO.:  [id number]            PATIENT TYPE:   LOCATION:                                 FACILITY:   PHYSICIAN:  Salvatore Decent. Cornelius Moras, M.D.      DATE OF BIRTH:   DATE OF ADMISSION:  08/07/2006  DATE OF DISCHARGE:                                HISTORY & PHYSICAL   PRESENTING CHIEF COMPLAINT:  Exertional chest pain and shortness of breath.   HISTORY OF PRESENT ILLNESS:  Ms. Petitfrere is a 55 year old obese white female  with history of coronary artery disease, hypertension and hyperlipidemia.  The patient originally presented in February 2007 with symptoms of chest  pain consistent with angina pectoris.  She underwent cardiac catheterization  by Dr. Riley Kill on November 06, 2005.  She was found to have 80% stenosis of  the left anterior descending coronary artery with three-vessel coronary  artery disease and normal left ventricular function.  She was also noted to  have significant pulmonary hypertension which was attributed to her history  of obstructive sleep apnea and obesity.  She underwent percutaneous coronary  intervention and stenting of the left anterior descending coronary artery  using nondrug-eluting stent.  This was chosen due to concerns regarding the  patient's ability to take Plavix in the long-term.   Ms. Stipp reports that initially she did well after this intervention.  However, over the last 2-3 months, she has had recurrent symptoms of  exertional chest tightness and shortness of breath.  She returned to see Dr.  Riley Kill and underwent cardiac catheterization in early October.  She was  found to have persistent three-vessel coronary artery disease with some in-  stent restenosis in the left anterior descending coronary artery.  She  underwent Rotablator atherectomy and repeat percutaneous coronary  intervention on the left anterior descending coronary artery.  However, the  patient reports that  her symptoms did not improve.  A follow-up  catheterization was also performed in late October, confirming the  persistence of significant in-stent restenosis of the left anterior  descending coronary artery as well as three-vessel coronary artery disease  and normal left ventricular function.  Ms. Franko was strongly encouraged to  quit smoking which she finally did.  She has now been referred to consider  surgical revascularization.  She continues to have frequent symptoms of  exertional chest tightness and shortness of breath.  She denies any chest  discomfort or shortness of breath occurring at rest.  She denies any  nocturnal angina.  She denies orthopnea or lower extremity edema.  She has  not had palpitations.   REVIEW OF SYSTEMS:  GENERAL:  The patient reports that she has gained 10  pounds in weight over the last 6 weeks since she quit smoking.  She blames  this on her lipid lowering drug that she is taking.  CARDIAC:  Notable for  exertional angina and shortness of breath.  The patient denies symptoms  suggestive of congestive heart failure.  The patient denies resting angina.  RESPIRATORY:  Notable for the absence  of productive cough, hemoptysis,  wheezing.  GASTROINTESTINAL:  Notable for some occasional mild symptoms of  reflux that have been controlled on medical therapy.  She reports no  difficulty swallowing.  She reports normal bowel function.  She denies  hematochezia, hematemesis, melena.  GENITOURINARY:  Notable for the absence  of urinary urgency or frequency.  MUSCULOSKELETAL:  Notable for the absence  of arthritis or arthralgias.  NEUROLOGIC:  Negative.  The patient does  report occasional headaches.  She denies transient monocular blindness or  transient numbness or weakness.  HEMATOLOGIC:  Negative.  PSYCHIATRIC:  Notable for some problems with depression.  HEENT:  Negative.   PAST MEDICAL HISTORY:  1. Coronary artery disease.  2. Hypertension.  3.  Hyperlipidemia.  4. Obstructive sleep apnea.  5. Asthma.  6. Longstanding tobacco abuse, quit smoking October 2007.   PAST SURGICAL HISTORY:  1. Hernia repair.  2. Bladder resuspension.  3. Left ear surgery.  4. Bilateral knee arthroscopy.   FAMILY HISTORY:  The patient's father died of acute myocardial infarction at  age 58.   SOCIAL HISTORY:  The patient is divorced and lives with her mother and two  teenage sons here in [place].  She works as a Warden/ranger at [employer].  She quit smoking in October having previously smoked one-half to one pack of  cigarettes per day for many years.  The patient denies excessive alcohol  consumption.   CURRENT MEDICATIONS:  1. Simvastatin 40 mg daily.  2. Aspirin 325 mg daily.  3. Levothyroxine 125 mcg daily.  4. Metoprolol 25 mg twice daily.  5. Lasix 20 mg once daily.  6. Potassium chloride 20 mEq once daily.  7. Fluoxetine 10 mg twice daily.  8. Zetia 10 mg daily.  9. Naprosyn 500 mg one to two daily as needed.  10.Plavix 75 mg daily.  11.Prilosec over-the-counter 1 tablet daily as needed.   DRUG ALLERGIES:  SULFA.   PHYSICAL EXAM:  The patient is a well-appearing obese female who appears her  stated age in no acute distress.  Blood pressure is 112/60 and pulse is 58  and regular, oxygen saturation is 99% on room air.  She is afebrile.  HEENT  exam is grossly unremarkable.  The neck is supple.  There is no cervical nor  supraclavicular lymphadenopathy.  There is no jugular venous distension.  No  carotid bruits are noted.  Auscultation of the chest demonstrates clear  breath sounds which are symmetrical bilaterally.  No wheezes or rhonchi  demonstrated.  Cardiovascular exam includes regular rate and rhythm.  No  murmurs, rubs or gallops are noted.  Heart sounds are distant.  The abdomen  is obese but soft and nontender.  There are no palpable masses.  Bowel sounds are present.  The extremities are warm and adequately perfused.   There is no lower extremity edema.  Distal pulses are palpable, although  somewhat thready and slightly diminished bilaterally.  There is no sign of  significant venous insufficiency.  The skin is clean, dry, healthy-appearing  throughout.  Rectal and GU exams are both deferred.  Neurologic examination  is grossly nonfocal.   DIAGNOSTIC TESTS:  Cardiac catheterization performed November 06, 2005,  June 13, 2006, June 16, 2006 and July 04, 2006 are all reviewed.  These demonstrates three-vessel coronary artery disease with initially high-  grade stenosis of the left anterior descending coronary artery which was  initially corrected with percutaneous coronary intervention.  The patient  did develop restenosis  of this vessel which persists at this time.  There  are no critical high-grade lesions in any of the major coronary arteries,  but nonetheless the patient has significant flow-limiting lesions in all  three vascular territories.  Specifically, there is long segment tubular 70%  stenosis of the mid left anterior descending coronary artery.  There is 80%  ostial stenosis of the diagonal branch which is somewhat small but  nonetheless may be large enough for grafting.  There is a huge ramus  intermediate branch that has perhaps 30-40% proximal stenosis.  The distal  left circumflex coronary artery has 75-80% stenosis.  There is right-  dominant coronary circulation.  There is 70% stenosis of the mid-posterior  descending coronary artery.  Left ventricular function appears normal.   IMPRESSION:  Severe three-vessel coronary artery disease with normal left  ventricular function and persistent symptoms of exertional chest tightness  and shortness of breath consistent with angina pectoris.  The patient's  symptoms are reliably relieved by administration of sublingual  nitroglycerin.  She has quit smoking and appears to be on optimal medical  therapy.  She has had attempted percutaneous  coronary intervention in the  past and her symptoms persist.  I believe she would probably best be treated  with surgical revascularization.   PLAN:  I have discussed options at length with Ms. Maultsby here in the office  today.  Alternative treatment strategies have been discussed.  She  understands and accepts all associated risks of surgery including but not  limited to risk of death, stroke, myocardial infarction, congestive heart  failure, respiratory failure, pneumonia, bleeding requiring blood  transfusion, arrhythmia, heart block with bradycardia, recurrent coronary  artery disease.  She understands the potential increased risk for possible  wound complications due to her obesity.  This may also increase her risk of  respiratory compromise as well.  All of her questions have been addressed.  We tentatively plan to proceed with surgery on Thursday, August 07, 2006. Ms. Osland has been instructed not to take any more  Plavix between now and  the time of surgery.  She will otherwise continue on all of her other  previous medications.      Salvatore Decent. Cornelius Moras, M.D.  Electronically Signed     CHO/MEDQ  D:  07/28/2006  T:  07/29/2006  Job:  [id number]   cc:   Arturo Morton. Riley Kill, MD, [facility]  Neta Mends. Fabian Sharp, MD

## 2011-01-25 NOTE — Discharge Summary (Signed)
[name], [name] NO.:  [id number]   MEDICAL RECORD NO.:  [id number]          PATIENT TYPE:  INP   LOCATION:  6522                         FACILITY:  [facility]   PHYSICIAN:  Ok Anis, NPDATE OF BIRTH:  1956-08-18   DATE OF ADMISSION:  11/05/2005  DATE OF DISCHARGE:  11/09/2005                                 DISCHARGE SUMMARY   PRINCIPAL DIAGNOSIS:  Coronary artery disease.   OTHER DIAGNOSES:  1.  Hypertension.  2.  Hyperlipidemia.  3.  Obstructive sleep apnea.  4.  Obesity.  5.  Pulmonary hypertension.  6.  History of hyperthyroidism, status post I-131 with subsequent      hypothyroidism.  7.  Chronic obstructive pulmonary disease.  8.  Ongoing tobacco abuse.   ALLERGIES:  AMOXICILLIN, SULFA.   PROCEDURE:  Right and left heart cardiac catheterization, successful PCI and  stenting of the left LAD.   HISTORY OF PRESENT ILLNESS:  A 55 year old white female with no prior  history of coronary artery disease, who was admitted November 05, 2005  following a 2 day history of increasing shortness of breath along with left  sided chest pain and diaphoresis.   HOSPITAL COURSE:  Following admission, cardiac markers were negative. ECG  revealed sinus rhythm with T wave inversion in leads 1, AVL, and V4 through  V6, which was not a new finding. She underwent right and left heart cardiac  catheterization on November 06, 2005 revealing a significant 80% stenosis in  the proximal LAD with otherwise, non-obstructive coronary disease. Right  heart pressures were moderately elevated with an RA of 16, RV of 54/19, PA  of 54/30 and a wedge of 29. EF was greater than 60% without evidence of  mitral regurgitation. A followup echocardiogram revealed an EF of 70% to 75%  with normal wall motion and normal RV function without significant valvular  abnormalities. She was taken back to the cardiac catheterization lab on  November 08, 2005 for successful PCI and stenting  of the proximal LAD with  placement of a 2.5 x 24 mm Express II Baer metal stent. She tolerated this  procedure well and was enrolled in the [place] study. This morning, she has  been ambulating without any chest pain or shortness of breath. Cardiac  markers are negative and ECG is stable. She is being discharged home today  in satisfactory condition.   LABORATORY DATA:  On discharge hemoglobin 12.3, hematocrit 34.4. White blood  cell count 6.1. Platelets 193,000. MCV 95.4. Sodium 138, potassium 3.4,  chloride 107, CO2 27, BUN 7, creatinine 0.9, glucose 112. Total bilirubin  0.8. Alkaline phosphatase 51. AST 14, ALT 15, albumin 3.3. CK 53, MB 1.0.  Troponin I 0.04. Total cholesterol 199, triglycerides 173, HDL 34, LDL 130.  Calcium is 8.4. TSH is 9.996.   DISPOSITION:  The patient is being discharged home today in good condition.   FOLLOW UP:  1.  She is asked to followup with Dr. Riley Kill in approximately 2 weeks and      she will contacted with an appointment.  2.  She is  also asked to followup with her Endocrinologist, Dr. Everardo All in 3      to 4 weeks for additional hypothyroidism management.   SPECIAL INSTRUCTIONS:  She has been counseled on the importance of smoking  cessation.   DISCHARGE MEDICATIONS:  1.  Aspirin 325 mg daily.  2.  Plavix 75 mg daily x30 days.  3.  Metoprolol 25 mg b.i.d.  4.  Simvastatin 40 mg q.h.s.  5.  Levothyroxine 100 mcg daily.  6.  Albuterol MDI q.4 hours p.r.n.  7.  Nitroglycerin 0.4 mg sublingual p.r.n. chest pain.  8.  Wellbutrin SR 150 mg b.i.d. (for smoking cessation).  9.  Lasix 20 mg daily.  10. Potassium chloride 20 meq daily.   OUTSTANDING LABORATORY STUDIES:  None.   DURATION OF DISCHARGE ENCOUNTER:  45 minutes including physician time.      Ok Anis, NP     CRB/MEDQ  D:  11/09/2005  T:  11/10/2005  Job:  [id number]   cc:   Neta Mends. Fabian Sharp, M.D. [facility]  [address] [place]  [place] [address]   Arturo Morton.  Riley Kill, M.D. [facility]  [address]  [address]  [place]  [place] [address]   Cleophas Dunker. Everardo All, M.D. [facility]  [address]  [place]  [place] [address]

## 2011-01-25 NOTE — H&P (Signed)
[facility] ADMISSION   [name], [name]                       MRN:          [id number]  DATE:06/13/2006                            DOB:          1955/09/27    [name] is a patient of Dr. Rosalyn Charters.  She was seen by the PA today.  I  actually take care of [name].   She is status post bare-metal stenting of Tina LAD earlier in 2007.  She had  some residual 70% AV groove circumflex disease as well.   She has been having increasing chest pain relived with nitroglycerin.  This  tends to occur after walking only 200-400 feet.  She has immediate relief  with nitroglycerin.  She has not had any prolonged pain.  I had along  discussion with Tina patient and explained to Tina that these symptoms were  worrisome and I though she should be hospitalized and have a heart  catheterization today.  We will call over to Tina hospital and discuss with  Dr. Riley Kill.  She has been compliant with Tina medications.   She has not had any PND, orthopnea, cough, or lower extremity edema.   Tina family is under a little bit of stress.  Tina patient's [name] lost a  child recently and they are trying to get custody.   Tina review of systems otherwise benign.   MEDICATIONS:  1. Lopressor 25 b.i.d.  2. Simvastatin 40 a day.  3. Levoxyl 120 mcg a day.  4. Lasix 20 a day.  5. KCl  20 a day.  6. Zetia 10 a day.  7. Fluoxetine 10 mg a day.   She is allergic to SULFA and AMOXICILLIN.  She last ate at 8 o'clock.  She  did have a chest x-ray last week.   EXAMINATION:  GENERAL:  She is overweight.  VITAL SIGNS:  Tina blood pressure is 122/80, pulse of 50 and regular.  LUNGS:  Clear.  CARDIOVASCULAR:  Carotids normal.  There is an S1, S2 with normal heart  sounds.  NECK:  There is no thyromegaly.  There is no lymphadenopathy.  ABDOMEN:  Benign.  EXTREMITIES:  Distal pulses intact with no edema.   We will do an EKG here in Tina  office.   IMPRESSION:  1. Recurrent angina with minimal exertion.  2. History of bare-metal stenting of Tina left anterior descending coronary      artery.   Hospital today.  Place on heparin with Plavix bolus.  Catheterization today  by Dr. Riley Kill.   Continue beta blocker, followup lipid profile on simvastatin 40 mg a day.            ______________________________  Noralyn Pick. Eden Emms, MD, [facility]      PCN/MedQ  DD:  06/13/2006  DT:  06/13/2006  Job #:  [id number]

## 2011-01-25 NOTE — H&P (Signed)
Tina, [name] NO.:  [id number]   MEDICAL RECORD NO.:  [id number]          PATIENT TYPE:  INP   LOCATION:  4733                         FACILITY:  [facility]   PHYSICIAN:  Arturo Morton. Riley Kill, MD, FACCDATE OF BIRTH:  01-17-1956   DATE OF ADMISSION:  07/03/2006  DATE OF DISCHARGE:                                HISTORY & PHYSICAL   CHIEF COMPLAINT:  Chest pain.   HISTORY OF PRESENT ILLNESS:  [name] is a very pleasant 55 year old female who  was in her usual state of health until February 2007.  At that time she  presented with chest pain and some shortness of breath.  She was admitted to  the hospital, and ultimately underwent cardiac catheterization.  The  catheterization study was remarkable for the fact that she had some proximal  septal thickening by echocardiography, and she had had a history of sleep  apnea.  She underwent right and left heart catheterization demonstrating  some pulmonary hypertension with a right atrial pressure 16, mean wedge of  629, and elevated pulmonary pressures.  There was no step-up between the  superior vena cava and pulmonary artery.  Of note, the patient had an 80%  stenosis of the left anterior descending artery.  There was scattered  disease in both the PDA and the circumflex coronary artery.  Her overall LV  function was preserved.  We talked about various options.  The patient has  had some struggle with finances, and she was not felt to be a good candidate  for drug-eluting stent implantation.  We ultimately placed a 2.5 x 24  Express non-drug-eluting platform in the LAD and she had an uncomplicated  hospital course.  She then re-presented this past month with recurrent  symptoms, was noted to have in-stent restenosis and underwent repeat  dilatation of the LAD.  This was done with a cutting balloon and a regular  balloon.  A DES was considered again, but again there were issues about  finances, and there was also a side  branch involvement.  I also had Dr. Ofilia Neas review her films.  Dr. Tyrone Sage felt that she would be a candidate  for surgery, but we decided to treat her medically initially.   PAST MEDICAL HISTORY:  Remarkable for:  1. Hypertension.  2. Hyperlipidemia.  3. Obstructive sleep apnea.  4. Obesity.  5. Pulmonary hypertension.  6. History of hyperthyroidism status post I-131.  7. COPD.  8. History of tobacco use, although she quit 2 weeks ago.   MEDICINES:  1. Aspirin 325 mg daily.  2. Metoprolol 25 mg b.i.d.  3. Simvastatin 40 mg q.h.s.  4. Levothyroxine 125 mcg daily.  5. Lasix 20 mg daily.  6. KCl 20 mEq daily.  7. Zetia 10 mg daily.  8. Fluoxetine 10 mg two tablets daily.  9. Plavix 75 mg daily.  10.Naproxen 500 mg p.r.n.   REVIEW OF SYSTEMS:  The patient has been able to stop smoking.  She has not  had substantial weight loss.   FAMILY HISTORY:  Father had CABG at 47.  Mother  had an MI.   SOCIAL HISTORY:  The patient goes to [employer].  She has been a  chronic smoker, but this has improved recently.   PHYSICAL EXAMINATION:  GENERAL:  She is an alert, oriented female in no  acute distress.  VITAL SIGNS:  Weight is 228 pounds, blood pressure 115/70, pulse 52.  LUNG FIELDS:  Clear.  CARDIAC:  Rhythm is regular.  EXTREMITIES:  Real no edema.  Pulses are intact.  NEUROLOGIC:  Exam was nonfocal.   EKG reveals sinus bradycardia, otherwise unremarkable.   Recent chest x-ray suggested changes of sarcoidosis without acute findings.   IMPRESSION:  1. Recurrent chest pain over the past few days on a daily basis suggestive      of recurrent ischemic symptoms.  2. 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  3. History of obstructive sleep apnea.  4. Chronic tobacco abuse.  5. Hyperthyroidism status post treatment.   The patient will be admitted and undergo repeat cardiac catheterization.  She understands the risks and benefits and is agreeable to proceed.   I will  arrange this with my partners.      Arturo Morton. Riley Kill, MD, [facility]  Electronically Signed     TDS/MEDQ  D:  07/03/2006  T:  07/04/2006  Job:  [id number]

## 2011-01-25 NOTE — Assessment & Plan Note (Signed)
[facility]                            CARDIOLOGY OFFICE NOTE   [name], [name]                       MRN:          [id number]  DATE:08/26/2006                            DOB:          [birth year]    [name] is in for followup. She recently underwent revascularization  surgery, slowly and then surely she has continued to get better.  She is  a little bit sore over the incision.   Her medications include:  1. Simvastatin 40.  2. Aspirin. 325 mg daily.  3. Levothyroxine 125 mcg daily.  4. Furosemide 20 mg daily.  5. KCl 20 mEq daily.  6. Zetia 10 mg daily.  7. Metoprolol 25 mg b.i.d.  8. Fluoxetine 20 mg b.i.d.   PHYSICAL EXAMINATION:  The blood pressure is 108/70, pulse 55, weight  216 pounds.  LUNGS:  Fields are really quite clear with slight decrease in the bases.   The patient's chest x-ray is under penetrated and there appears to be a  small left effusion which would be appropriate from a post bypass  standpoint.   Overall, this nice lady is slowly improving.  She will need followup  labs.  She needs to continue to move and not smoke.  We will plan to see  her back in followup in 4 to 6 weeks.     Arturo Morton. Riley Kill, MD, [facility]  Electronically Signed    TDS/MedQ  DD: 09/10/2006  DT: 09/10/2006  Job #: [id number]

## 2011-01-25 NOTE — Op Note (Signed)
NAMEZAIRE, VANBUSKIRK NO.:  [id number]   MEDICAL RECORD NO.:  [id number]          PATIENT TYPE:  INP   LOCATION:  2315                         FACILITY:  [facility]   PHYSICIAN:  Etter Sjogren, M.D.     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   POSTOPERATIVE DIAGNOSIS:  Sternal dehiscence, mediastinitis and  complicated wound to the chest.   PROCEDURE:  1. Sternal debridement.  2. Right pectoralis muscle turnover flap.  3. Left pectoralis major muscle advancement flap.   ASSISTANT:  Evelene Croon, M.D.   ANESTHESIA:  General.   ESTIMATED BLOOD LOSS:  400 mL.   DRAINS:  Four Blake's were left.   CLINICAL NOTE:  This is a 55 year old woman who has had a coronary  bypass graft back in November.  She recently suffered a sternal  infection and a dehiscence and had debridement and placement of a VAC.  She now presents for sternal debridement and wound closure using  pectoralis major muscle flaps.  Bilateral muscle flaps were planned.  A  right turnover flap and a left advancement flap.  Procedure risks and  possible complications were discussed with her in detail and she  understood those risks and possible complications and wished to proceed.   DESCRIPTION OF PROCEDURE:  The patient was taken to the operating room,  placed supine.  After successful administration of general anesthesia,  she was prepped with Betadine and draped with sterile drapes.  Sternal  debridement was performed using a rongeur.  This was a meticulous  debridement.  The wound was then excised using a scalpel.  Meticulous  hemostasis using electrocautery.  The dissection then carried out on top  of the left pectoralis major muscle, back to the shoulder where the  muscle was divided from its insertion under  direct vision.  The muscle  was then lifted on its posterior surface and the thoracoacromial pedicle  was identified and preserved.  The muscle was separated from the  clavicular head and also was advanced in position, advanced very easily  to fill the upper half of the wound.  Noticed it had excellent color and  was viable.   Attention was then directed to the right side where the dissection was  carried out on the anterior surface of the muscle, out laterally.  A  counter incision was made at the axilla and the pectoralis major was  released off of the insertion.  Again, it was separated from the  clavicular head and the muscle was swept medially with the  thoracoacromial pedicle identified and then this pedicle was divided  carefully, triple ligated proximally, and double ligated distally with  Liga clips and divided.  The muscle was reflected in an inferomedial  direction and was taken down off of the first two intercostales.  Meticulous hemostasis with electrocautery.  The muscle reflected easily  into position to fill the inferior half of the wound.  Thorough  irrigation with saline.  Excellent hemostasis was confirmed.  Blake  drains were positioned, brought through separate stab wounds inferiorly  and secured with 3-0 Prolene sutures.  One was placed in the right  chest, one in the left chest, one deep to the muscle flaps, and one  anterior to the muscle flaps.  The muscles were inset with 3-0 Vicryl  interrupted horizontal mattress sutures and sutured to one another also  using 3-0 Vicryl sutures.  The skin was advanced and closed with 2-0  Vicryl and 2-0 Monocryl interrupted inverted deep dermal sutures and  skin staples.  Skin edges appeared to have good color at the conclusion  of this closure.  Antibiotic ointment and dry sterile dressing lightly  applied and she is transported back to the intensive care unit in stable  condition, having tolerated the procedure  well.      Etter Sjogren, M.D.  Electronically Signed     DB/MEDQ  D:  09/23/2006  T:  09/24/2006  Job:  [id number]

## 2011-01-25 NOTE — Cardiovascular Report (Signed)
NAMESELINE, ENZOR NO.:  [id number]   MEDICAL RECORD NO.:  [id number]          PATIENT TYPE:  INP   LOCATION:  3799                         FACILITY:  [facility]   PHYSICIAN:  Arturo Morton. Riley Kill, M.D. [facility] OF BIRTH:  1956/09/04   DATE OF PROCEDURE:  11/08/2005  DATE OF DISCHARGE:                              CARDIAC CATHETERIZATION   INDICATIONS:  Ms. Bergey is a delightful 55 year old female who has recently  presented with chest pain.  She underwent catheterization demonstrating a  moderate stenosis in the PDA, some disease in the distal circumflex and a  moderately high-grade stenosis in the mid left anterior descending artery.  We were worried about the LAD lesion and felt that percutaneous intervention  was needed.  The patient does have also evidence of pulmonary hypertension  associated with sleep apnea, hypertension, and elevated end-diastolic  pressure.  She has been diuresed while in the hospital.  We have encouraged  discontinuation of smoking.  Percutaneous intervention was recommended.  After a long discussion with the patient, she desired to have a non-DES  platform.  She was brought to the lab for further evaluation.  She was  agreeable to enrollment in the [place] protocol.   PROCEDURE:  Percutaneous stenting of the left anterior descending artery.   DESCRIPTION OF PROCEDURE:  The patient was brought to the catheterization  laboratory after informed consent AND prepped and draped in usual fashion.  using a Smart needle, We entered the front wall of the left femoral artery.  A 6-French sheath was placed.  a JL-3.5 guide was utilized.  Bivalrudin was  given according to protocol.  Randomization in the [place] protocol with  cangrelor and/or clopidogrel was begun.  A Prowater wire was then placed  down the left anterior descending artery and predilatation done with a 15 x  2.25 Maverick balloon.  Following this a 2.5 x 24 Express non drug-eluting  platform was placed across the lesion.  This also subtended across the  diagonal branch.  We were able to take this stent up to 14 atmospheres.  Post dilatation was then done with a 2.75 Quantum Maverick balloon at  moderately high pressures ,with careful attention to stay with inside the  edges of the stent.  Intracoronary nitroglycerin was then administered and  final angiographic views obtained.  The final angiographic result was felt  to be excellent.  There were no complications.  The femoral sheath sewn into  place and she was taken to the holding area in satisfactory clinical  condition.   ANGIOGRAPHIC DATA:  The left anterior descending artery demonstrates a  moderately high-grade stenosis of approximately 80% in the midvessel.  Just  distal to this is the takeoff of the diagonal branch but at a reasonable  angle.  Following the deployment of the stent the stenosis was reduced to 0%  residual luminal narrowing with an excellent angiographic appearance.  There  were no complications.  There was excellent runoff into the distal vessel.  The distal vessel does have a mild area of about 50% narrowing in the distal-  most portion  of the artery.  Also, the diagonal has a mid stenosis of about  50%.  There were no complications noted.   CONCLUSION:  Successful percutaneous stenting of the left anterior  descending artery using a non-drug-eluting stent platform.   DISPOSITION:  The patient will be treated with aspirin and Plavix.  She will  need Plavix for about a month.  There has been significant concern among the  patient about her ability to afford Plavix on a long-term basis as she works  a full-time job and is also a Consulting civil engineer.  We discussed these issues in some  detail.      Arturo Morton. Riley Kill, M.D. [facility]  Electronically Signed     TDS/MEDQ  D:  11/08/2005  T:  11/09/2005  Job:  [id number]   cc:   Neta Mends. Fabian Sharp, M.D. [facility]  [address] [place]  [place] [address]    Arturo Morton. Riley Kill, M.D. [facility]  [address]  [address]  [place]  [place] [address]   Cardiovascular Laboratory

## 2011-01-25 NOTE — Assessment & Plan Note (Signed)
[facility]                              CARDIOLOGY OFFICE NOTE   [name], [name]                       MRN:          [id number]  DATE:07/11/2006                            DOB:          1955/12/22    [name] is in for followup.  She has had some mild intermittent chest  discomfort.  She was readmitted with chest discomfort, and underwent repeat  catheterization by Dr. Excell Seltzer.  This demonstrated about the same findings.  There is modest restenosis of her LAD, although it certainly does not appear  to be critical in any way.  There is a moderate stenosis of her mid PDA.  There is scattered irregularity of the circumflex vessel.  Her overall LV  function is excellent.  The length of the LAD lesion is really quite long,  and I suspect it may continue to re-narrow.  The patient did not feel that  she was a candidate for a drug-eluting stent, in part because of finances  and inability to afford Plavix.  At the present time, her major complaint is  that she feels inordinately fatigued.  She has had an occasional episode of  chest pain, but it has been mostly the fatigue.  She wishes to consider  revascularization surgery as an option, and I have previously reviewed her  films with Dr. Ofilia Neas.   PHYSICAL EXAMINATION:  The blood pressure is 118/70, the pulse is 54.  The lung fields are clear.  The cardiac exam is unremarkable.   We are going to go ahead at this point and cut back her metoprolol to 25 mg  p.o. b.i.d.  It is quite conceivable that this is responsible for the  fatigue.  If she should experience increasing chest discomfort, she is to  notify our office promptly.  In the interim we are also going to get her an  appointment to see Dr. Ofilia Neas as a possible candidate for  revascularization surgery.  Although the LAD is not critical, it is at least  moderate, and going forward it is quite conceivable that she will require a  revascularization surgery, and she would like to go ahead and meet the  surgeon and possibly explore current options.  I will see her back in  followup in 2 weeks, and she is to keep close contact with our office with  any change in symptomatology.     Arturo Morton. Riley Kill, MD, [facility]  Electronically Signed    TDS/MedQ  DD: 07/11/2006  DT: 07/12/2006  Job #: [id number]

## 2011-01-25 NOTE — Cardiovascular Report (Signed)
[name], [name] NO.:  [id number]   MEDICAL RECORD NO.:  [id number]          PATIENT TYPE:  INP   LOCATION:  2306                         FACILITY:  [facility]   PHYSICIAN:  Tina Morton. Riley Kill, MD, FACCDATE OF BIRTH:  [birth year]   DATE OF PROCEDURE:  06/16/2006  DATE OF DISCHARGE:                              CARDIAC CATHETERIZATION   INDICATIONS:  [name] is a 55 year old who is well-known to me.  She  previously underwent stenting of the left anterior descending artery.  The  stent was a non drug-eluting platform, and this was placed after an  extensive discussion with the patient regarding stent options and potential  long-term consequences.  She has developed some recent chest discomfort.  She underwent repeat cardiac catheterization.  The catheterization study  demonstrated a fair amount of in-stent restenosis with some stenosis just  distal to the stent. There remains some disease of the distal circumflex as  well as the PDA.  [name] and I reviewed the films and it was felt that  best option would be to leave both the RCA and circumflex lesions along with  a strong recommendation to discontinue smoking.  Repeat PCI was thought to  be a possibility but we entertained three different options with the  patient.  One was to place a drug-eluting stent in the left anterior  descending artery, with the understanding that there could be side branch  loss due to the dual stents.  There would also be the long-term necessity of  dual antiplatelet therapy potentially.  The other option was to repeat  dilatation of the LAD.  The third option was to consider revascularization  surgery, and I reviewed the films with [name] who felt that she  potentially was a surgical candidate.  I discussed all these options with  the patient, and we explained to her that surgery would be the most  definitive option.  However, there are multiple competing issues right  now  and the patient preferred an attempt at repeat dilatation of the restenosed  stent.  With this, the plan was to proceed on.   She understood the risks, benefits and alternatives.   PROCEDURE:  PTCA of the left anterior descending artery.   DESCRIPTION OF PROCEDURE:  The patient was brought to the catheterization  laboratory, prepped and draped in the usual fashion.  With a Smart needle,  the right femoral artery was entered.  A 6-French sheath was placed.  Bivalirudin was given according to protocol and ACT checked and found to be  appropriate.  As in the previous procedure, a JL-3.5 guiding catheter was  utilized to intubate the left main.  A Prowater wire was then passed down  the left anterior descending artery and we initially used a 2.5 x 15 cutting  balloon.  Cutting balloon angioplasty was done just proximal to the stent  and also just distal to the stent.  After this, we took a 2.75 x 20 Maverick  and dilated distal to the stent and proximal to the stent.  Intracoronary  nitroglycerin was administer because  of some spasm and documented on the  film.  We waited to reassess the lesion in the laboratory and based on the  final angiographic results felt that this would be the best angiographic  result at the present time.  The procedure was completed and the patient was  taken to the holding area in satisfactory clinical condition after removal  of the catheters, and sewing in of the femoral sheath.  There were no major  complications.   ANGIOGRAPHIC DATA:  The left main coronary has a little bit of tapering but  it is not significant angiographically.  The LAD has about 30% proximal  narrowing, and then there is about 40-50% proximal narrowing just proximal  to the stent and there is diffuse 70-80% in the proximal portion of the  stent.  The distal portion of the stent is a little bit more open but there  is a second lesion just distal to the stent site.  Following  intracoronary  nitroglycerin, this area opens up.  Following balloon dilatation, there was  marked improvement in the entire segment and there was no loss of the  diagonal which itself has about 70-80% narrowing right at the ostium.  We  elected to leave this alone.   There was TIMI III flow in the distal vessel.  The AV circumflex appeared  unchanged.   CONCLUSION:  Successful percutaneous angioplasty of the left anterior  descending artery.   DISPOSITION:  We will try to treat her with aspirin and Plavix, continued  medical therapy will be warranted.  Discontinuation of smoking would be  helpful.      Tina Morton. Riley Kill, MD, [facility]  Electronically Signed     TDS/MEDQ  D:  06/16/2006  T:  06/17/2006  Job:  [id number]   cc:   Tina Mends. Panosh, MD  CV Laboratory  Patient's medical record

## 2011-01-25 NOTE — Op Note (Signed)
NAMECHRISTEAN, SILVESTRI NO.:  [id number]   MEDICAL RECORD NO.:  [id number]          PATIENT TYPE:  INP   LOCATION:  2305                         FACILITY:  [facility]   PHYSICIAN:  Salvatore Decent. Cornelius Moras, M.D. DATE OF BIRTH:  14-Mar-1956   DATE OF PROCEDURE:  08/06/2006  DATE OF DISCHARGE:                               OPERATIVE REPORT   PREOPERATIVE DIAGNOSIS:  Severe 3-vessel coronary artery disease.   POSTOPERATIVE DIAGNOSES:  Severe 3-vessel coronary artery disease.   PROCEDURE:  Median sternotomy for coronary artery bypass grafting x4  (left internal mammary artery to distal left anterior descending  coronary artery, saphenous vein graft to ramus intermediate branch and  sequential saphenous vein graft to distal left circumflex coronary  artery, saphenous vein graft to posterior descending coronary artery,  endoscopic saphenous vein harvest from right thigh.)   SURGEON:  Salvatore Decent. Cornelius Moras, M.D.   ASSISTANT:  Constance Holster, PA.   ANESTHESIA:  General.   BRIEF CLINICAL NOTE:  The patient is a 55 year old female with a history  of hypertension, hyperlipidemia, obstructive sleep apnea, obesity,  longstanding tobacco abuse, and a family history of coronary artery  disease.  The patient originally presented in February 2007 with  symptoms consistent with angina pectoris.  She underwent cardiac  catheterization followed by percutaneous coronary intervention of the  left anterior descending coronary artery.  The patient initially did  well, but developed recurrent symptoms of chest pain.  She has had  multiple repeat cardiac catheterizations and percutaneous coronary  intervention; and most recent catheterization demonstrates severe 3-  vessel coronary artery disease with restenosis of the stent involving  the left anterior descending coronary artery.  Left ventricular function  remains normal.  A full consultation note has been dictated previously.   OPERATIVE CONSENT:   The patient has been counseled regarding the  indications, risks, and potential benefits of surgery.  Alternative  treatment strategies have been discussed.  She understands and accepts  all associated risks of surgery; including but not limited to risk of  death, stroke, myocardial infarction, congestive heart failure,  respiratory failure, pneumonia, bleeding requiring blood transfusion,  arrhythmia, infection, recurrent coronary artery disease.  All of her  questions have been addressed.   OPERATIVE FINDINGS:  1. Normal left ventricular systolic function with mild left      ventricular hypertrophy and mild diastolic dysfunction.  2. Good-quality left internal mammary artery and saphenous vein      conduit.  3. Intramyocardial left anterior descending coronary artery.  4. Good-quality target vessels for grafting with the exception of the      distal left circumflex coronary artery which was small and      diffusely diseased.  5. Diagonal branch off the left anterior descending coronary artery      too small for grafting.   OPERATIVE NOTE IN DETAIL:  The patient was brought to the operating room  on the above-mentioned date; and central monitoring was established by  the anesthesia service under the care and direction of Dr. Quita Skye.  Krista Blue.  Specifically, a Swan-Ganz catheter is  placed through the right  internal jugular approach.  A radial arterial line is placed.  Intravenous antibiotics are administered.  Following induction with  general endotracheal anesthesia, a Foley catheter is placed.  The  patient's chest, abdomen, both groins, and both lower extremities are  prepared and draped in a sterile manner.   A median sternotomy incision is performed and the left internal mammary  artery was dissected from the chest wall and prepared for bypass  grafting.  The left internal mammary artery is of good-quality conduit.  Simultaneously, saphenous vein was obtained from the  patient's right  thigh using endoscopic vein harvest technique.  The saphenous vein is  good-quality conduit.  After the saphenous vein is removed, the small  surgical incision in the right lower extremity is closed in multiple  layers with running absorbable suture.  The patient is heparinized  systemically.  The left internal mammary artery is transected distally  and is noted to have excellent flow.   The pericardium is opened.  The ascending aorta is normal in appearance.  The ascending aorta and the right atrium are cannulated for  cardiopulmonary bypass.  Adequate heparinization is verified.  Cardiopulmonary bypass is begun and the surface of the heart is  inspected.  Distal sites are selected for coronary bypass grafting.  A  temperature probe is placed in the left ventricular septum.  A  cardioplegic catheter placed in the ascending aorta.   The patient is allowed to cool passively to 32 degrees systemic  temperature.  The aortic crossclamp is applied and cold blood  cardioplegia is administered in antegrade fashion through the aortic  root.  Iced saline slush is applied for topical hypothermia.  The  initial cardioplegic arrest and myocardial cooling are felt to be  excellent.  Repeat doses of cardioplegia are administered intermittently  throughout the crossclamp portion of the operation, through the aortic  root and down subsequently placed vein grafts to maintain left  ventricular septal temperature below 15 degrees centigrade.   The following distal coronary anastomoses are performed:  1. The posterior descending coronary artery is grafted with a      saphenous vein graft in an end-to-side fashion.  This vessel      measures 2.0 mm in diameter and is of good quality at the site of      distal bypass.  2. The ramus intermediate branch is grafted with a saphenous vein      graft in a side-to-side fashion.  This vessel measures 2.0 mm in     diameter and is a fair-to-good  quality target.  It is diffusely      diseased throughout.  3. The distal left circumflex coronary artery is grafted using a      sequential saphenous vein graft off of the vein placed to the ramus      intermediate branch.  This vessel measures 1.0 mm in diameter; and      is diffusely diseased at the site of distal bypass.  4. The distal left anterior descending coronary artery is grafted with      the left internal mammary artery in an end-to-side fashion.  This      vessel is intramyocardial.  It measures 1.5 mm in diameter at the      site of distal bypass; and is a good-quality target vessel.   Both proximal saphenous vein anastomoses are performed directly to the  ascending aorta prior to removal of the aortic crossclamp.  The  left  ventricular septal temperature rises rapidly with reperfusion of the  left internal mammary artery.  The aortic crossclamp is removed after a  total crossclamp time of 74 minutes.  The heart begins to beat  spontaneously without the need for cardioversion.  All proximal and  distal coronary anastomoses are inspected for hemostasis and appropriate  graft orientation.  Epicardial pacing wires are fixed to the right  ventricular outflow tract into the right atrial appendage.  The patient  is rewarmed to 37 degrees centigrade temperature.  The patient is weaned  from cardiopulmonary bypass without difficulty.  The patient's rhythm at  separation from bypass is sinus rhythm.  Atrial pacing is employed to  increase the heart rate.  Total cardiopulmonary bypass time for the  operation is 88 minutes.  No inotropic support is required.   The venous and arterial cannulae are removed uneventfully.  Protamine is  administered to reverse the anticoagulation.  The mediastinum and the  left chest are irrigated with saline solution containing vancomycin.  Meticulous surgical hemostasis is ascertained.  The mediastinum and the  left chest are drained using 3 chest  tubes exited through separate stab  incisions inferiorly.  The soft tissues anterior to the aorta are  reapproximated loosely.  The sternum is closed with double-strength  sternal wire.  The soft tissues anterior to the sternum are closed in  multiple layers; and the skin is closed with a running subcuticular skin  closure.   The patient tolerated the procedure well and is transported to the  surgical intensive care unit in stable condition.  There are no  intraoperative complications.  All sponge, instrument and needle counts  were verified correct at completion of the operation.  No blood products  were administered.      Salvatore Decent. Cornelius Moras, M.D.  Electronically Signed     CHO/MEDQ  D:  08/06/2006  T:  08/06/2006  Job:  [id number]   cc:   Arturo Morton. Riley Kill, MD, [facility]  Neta Mends. Fabian Sharp, MD

## 2011-01-25 NOTE — Consult Note (Signed)
[name], CLAY NO.:  [id number]   MEDICAL RECORD NO.:  [id number]          PATIENT TYPE:  INP   LOCATION:  2315                         FACILITY:  [facility]   PHYSICIAN:  Etter Sjogren, M.D.     DATE OF BIRTH:  Apr 02, 1956   DATE OF CONSULTATION:  DATE OF DISCHARGE:                                 CONSULTATION   REQUESTING PHYSICIAN:  Evelene Croon, M.D.   CHIEF COMPLAINT:  Sternal dehiscence and infection.   HISTORY OF PRESENT ILLNESS:  This 55 year old woman is status post  coronary artery bypass grafting x4 in November 2007.  She has developed  some fever and positive blood cultures and underwent incision and  drainage of her sternum yesterday by Dr. Evelene Croon.  A saline  dressing was placed, and she was left on the vent overnight.  A plastic  surgery consultation was requested for evaluation for pectoralis muscle  flaps and wound closure.   PAST MEDICAL HISTORY:  Positive for coronary artery disease, positive  for asthma, positive for sleep apnea, and positive for hypertension.  Negative diabetes, renal disease, or GI disease.   SOCIAL HISTORY:  Positive for tobacco abuse.   REVIEW OF SYSTEMS:  Positive for the sleep apnea and no other noted in  the chart.   PHYSICAL EXAMINATION:  The sternal wound is fairly clean.  There is not  much granulation but there would not be much expected due to the recent  incision and drainage.   The recommendation will be for sternal debridement and pectoralis muscle  flaps.  A VAC will be placed in the time being, and prealbumin is  pending.  We will return at a later time to discuss these surgical plans  with her once she is extubated and out from under the sedation.      Etter Sjogren, M.D.  Electronically Signed     DB/MEDQ  D:  09/18/2006  T:  09/18/2006  Job:  [id number]   cc:   Evelene Croon, M.D.

## 2011-01-25 NOTE — Assessment & Plan Note (Signed)
[facility]                              CARDIOLOGY OFFICE NOTE   ELLISA, DEVIVO                       MRN:          [id number]  DATE:04/15/2006                            DOB:          19-Dec-1955    HISTORY OF PRESENT ILLNESS:  [name] is in for followup.  She is doing well.  She is not having any ongoing chest pain or shortness of breath.  She is now  out six months from percutaneous intervention.  She had a Bare metal stent  to the proximal left anterior descending on November 06, 2005.  She has  pulmonary hypertension.  She has obstructive sleep apnea, obesity, and  continued tobacco use.  Nonetheless, she is continuing to get along well.   PHYSICAL EXAMINATION:  VITAL SIGNS:  Blood pressure is 118/78, pulse is 58.  LUNGS:  Lung fields are clear.  CARDIAC:  The cardiac rhythm is regular.   LABORATORY DATA:  Electrocardiogram demonstrates normal sinus rhythm with  minor nonspecific T-wave abnormality.   Overall, the patient is doing well.  She will need to have lipid and liver  profile when she sees Dr. Fabian Sharp back, and also can talk with her about  strategies for discontinuation of smoking.  We will see her back in followup  in six months.                              Arturo Morton. Riley Kill, MD, [facility]    TDS/MedQ  DD:  04/15/2006  DT:  04/15/2006  Job #:  [id number]   cc:   Neta Mends. Fabian Sharp, MD

## 2011-01-25 NOTE — Cardiovascular Report (Signed)
[name], [name] NO.:  [id number]   MEDICAL RECORD NO.:  [id number]          PATIENT TYPE:  INP   LOCATION:  2028                         FACILITY:  [facility]   PHYSICIAN:  Arturo Morton. Riley Kill, MD, FACCDATE OF BIRTH:  04-21-1956   DATE OF PROCEDURE:  DATE OF DISCHARGE:                              CARDIAC CATHETERIZATION   INDICATIONS:  [name] is a very nice 55 year old white female with a history  of sleep apnea, moderate pulmonary hypertension, and coronary artery  disease.  She continues to smoke.  She has developed recent recurrence of  symptoms, and has been referred for repeat cardiac catheterization.   The patient underwent stenting of the left anterior descending artery with a  non drug-eluting stent by choice.  She has felt all long that she would not  be able to afford Plavix.   PROCEDURE:  1. Left heart catheterization.  2. Selective coronary angiography.  3. Selective left ventriculography.  4. Subclavian angiography.   DESCRIPTION OF PROCEDURE:  The procedure was performed from the right  femoral artery using 5-French catheter.  She tolerated the procedure without  complication and was taken to the holding area in satisfactory clinical  condition.   Angiographic data:  1. Ventriculography was done in the RAO projection.  Overall systolic      function is vigorous with an ejection fraction in excess of 60%.  2. The subclavian artery is widely patent as is the internal mammary.  3. The left main is free of critical disease.  4. The left anterior descending has a stent in the midportion there is an      area of diffuse in-stent restenosis of about 70 to 80% in the proximal      portion and perhaps slightly less in the more distal portion.  It      extends beyond the area of the stent about 3-5 mm.  The ostium of the      diagonal has about 50% narrowing in the diagonal which is a 1.5 mm      vessel, has about 60% narrowing in its midportion.  5.  The circumflex has a first marginal with about 30-40% mid-narrowing.      The AV circumflex has 50 and 70% tandem lesions.  6. The right coronary artery is a large dominant vessel.  There is mild      irregularity of the proximal mid junction of 20%. The mid PDA has a      stenosis of about 75% which is slightly hyperdense, the posterolateral      branches without critical narrowing.   CONCLUSION:  1. Well-preserved left ventricular function.  2. Patent internal mammary artery.  3. Moderate in-stent restenosis of the previously placed LAD stent with      some involvement of the diagonal.  4. Moderately high-grade stenosis of the mid posterior descending branch.   RECOMMENDATIONS:  The patient has continued to smoke.  I have reviewed three  basic options with her.  These include repeat PTCA with a standard balloon  which has a moderate chance of restenosis.  The second option would be drug-  eluting stent treatment for in-stent restenosis, and this would carry at  least some risk of side branch occlusion as well as the potential for late  stent thrombosis in the situation where the patient cannot afford Plavix.  Finally, revascularization surgery is an option with an internal mammary to  the LAD and possibly a vein graft to the distal right coronary and possibly  circumflex marginal.  However, the patient is fairly young, she does have  modest pulmonary hypertension, probably on the basis of some component of  lung disease and/or sleep apnea.  We will review all of the options with the  patient.  I have reviewed the films of Dr. Ofilia Neas, and she is a  candidate for surgery from a cardiac standpoint.      Arturo Morton. Riley Kill, MD, [facility]  Electronically Signed     TDS/MEDQ  D:  06/13/2006  T:  06/15/2006  Job:  [id number]   cc:   Arturo Morton. Riley Kill, MD, [facility]  Neta Mends. Panosh, MD  ?

## 2011-01-25 NOTE — Consult Note (Signed)
NAMEKALSEY, LULL NO.:  [id number]   MEDICAL RECORD NO.:  [id number]          PATIENT TYPE:  INP   LOCATION:  2019                         FACILITY:  [facility]   PHYSICIAN:  Antonietta Breach, M.D.  DATE OF BIRTH:  Feb 06, 1956   DATE OF CONSULTATION:  09/19/2006  DATE OF DISCHARGE:  10/01/2006                                 CONSULTATION   REASON FOR CONSULTATION:  Psychosis.   HISTORY OF PRESENT ILLNESS:  Ms. Fredenburg is a 55 year old female admitted  to the [facility] on September 14, 2006 due to increased pain and  drainage from a sternal surgical wound.   Ms. Skilton underwent coronary artery bypass grafting on August 06, 2006. She has had increased drainage from the wound site, and just prior  to admission, she began to have an elevated temperature. She has had  some positive blood cultures and underwent incision and drainage of her  sternum on January 9.   The patient has developed severe ideas of reference regarding something  on the gauze that the R.N.'s are using. She has intermittent thought  disorganization, agitation and easy distractibility. She also is having  a severe amount of self condemnation and critique regarding her past  without evidence that she has committed any significant moral  violations.   The patient is not combative, and she is cooperative with bedside care.  She does maintain time orientation as well as place orientation and self-  orientation.   She is not having any thoughts of harming herself or others.   PAST PSYCHIATRIC HISTORY:  The patient has no prior history of symptoms  of psychosis and delirium as above. She does have a history of several  weeks of depressed mood, decreased concentration, poor energy, decreased  interest and insomnia.   She also has a history of excessive worry, feeling on edge and muscle  tension for several years. She has undergone psychotherapy in the past.  She has no past psychiatric  admissions. She has never tried to harm  herself.   Past psychotropic trials include Pamelor, Paxil, Prozac, Effexor and  Zoloft.   She is currently on Prozac 20 mg b.i.d. and has had good results from  that.   FAMILY PSYCHIATRIC HISTORY:  None known.   SOCIAL HISTORY:  Marital status:  Divorced. She does not use any illicit  drugs. She has occasion use of alcohol with temperance and no social,  occupational, legal or financial complications.   She lives with her mother and her 2 children, 14 years and 57 years old.   She is a Consulting civil engineer at [employer]. She also  continues to work at this time. Religion is Methodist. She has a strong  support system.   GENERAL MEDICAL PROBLEMS:  Please see above.  1. Hypertension.  2. Asthma.  3. Obstructive sleep apnea.  4. History of hernia repair.  5. History of bladder suspension.  6. History of knee arthroscopy.   LABORATORY DATA:  SGOT 16, albumin 3.3, total protein 6.6, prealbumin  5.9, SGPT 15.   REVIEW OF SYSTEMS:  CONSTITUTIONAL:  Afebrile. HEAD:  No trauma. EYES:  No visual changes. EARS:  No hearing impairment. NOSE:  No rhinorrhea.  MOUTH AND THROAT:  No sore throat. NEUROLOGICAL:  Unremarkable.  PSYCHIATRIC:  As above. CARDIOVASCULAR:  She has chest pain from the  surgical wound. There are no palpitations or edema. RESPIRATORY:  No  coughing or wheezing. GASTROINTESTINAL:  No nausea, vomiting, diarrhea.  GENITOURINARY:  No dysuria. SKIN:  Unremarkable except for the surgical  wound. HEMATOLOGIC/LYMPHATIC:  Unremarkable. ENDOCRINE/METABOLIC:  Unremarkable. MUSCULOSKELETAL:  No deformities.   VITAL SIGNS:  Temperature afebrile. Pulse 78. Respirations 24. Blood  pressure is 117/76.   MENTAL STATUS EXAMINATION:  Ms. Orrison is a 55 year old female appearing  her chronologic age, lying in a supine position in her intensive care  unit bed with anxious affect and anxious mood. Her eye contact is  partial.  Her psychomotor tone is slightly agitated. She is well groomed.  She has anxious faces. She is oriented to the year, the day of the week  and the day of the month as well as place and person. Her thought  process involves intermittent disorganization on thought content. She  has some paranoid delusions and ideas of reference. She has no thoughts  of harming herself or others. Concentration:  Easily distractable. Fund  of knowledge and intelligence below that of her estimated premorbid  baseline. Sleep is slightly pressured but no dysarthria. Judgment is  impaired. Insight is poor. Memory is partially impaired for short term  and recent.   ASSESSMENT:  AXIS 1:  293.00, delirium not otherwise specified. 293.84,  anxiety disorder not otherwise specified. 293.83, mood disorder not  otherwise specified.   The patient appears to have a history of major depression and  generalized anxiety with delirium superimposed. She does have a number  of factors from a general medical point of view that could predispose  her to acute delirium.   AXIS II:  Deferred.  AXIS III:  See general medical problems.  AXIS IV:  General medical.  AXIS V:  20.   RECOMMENDATIONS:  1. Would begin Seroquel at 25 mg b.i.d. and then titrate as tolerated      and needed to an approximate trial dose of 50 mg b.i.d. for      antipsychosis agitation.  2. No change in the Prozac 20 mg b.i.d.  3. Low-stimulation ego supportive psychotherapy with memory and      orientation cues in the room.  4. Preliminary discharge planning is pending.      Antonietta Breach, M.D.  Electronically Signed     JW/MEDQ  D:  10/03/2006  T:  10/03/2006  Job:  [id number]

## 2011-01-25 NOTE — Op Note (Signed)
[name], WITTING NO.:  [id number]   MEDICAL RECORD NO.:  [id number]          PATIENT TYPE:  INP   LOCATION:  6705                         FACILITY:  [facility]   PHYSICIAN:  Evelene Croon, M.D.     DATE OF BIRTH:  17-Sep-1955   DATE OF PROCEDURE:  09/17/2006  DATE OF DISCHARGE:                               OPERATIVE REPORT   PREOPERATIVE AND POSTOPERATIVE DIAGNOSIS:  Deep sternal wound infection.   OPERATIVE PROCEDURE:  Excisional debridement of sternal wound.   ATTENDING SURGEON:  Evelene Croon, M.D.   ANESTHESIA:  General endotracheal.   CLINICAL HISTORY:  This patient is a 55 year old obese woman who  underwent coronary bypass graft surgery by Dr. Cornelius Moras on August 06, 2006.  Her initial course was apparent uncomplicated she was seen back  in the office a couple of weeks ago by Dr. Mechele Collin at which time she was  noted to have a small amount of drainage from the inferior aspect of her  chest incision.  This was treated with local wound care.  She was  admitted to the hospital on 09/14/2006 by Dr. Dorris Fetch after  presenting with low grade temperature of 99.5 and increasing pain and  drainage from that area.  She was also found have positive blood  cultures for gram positive cocci.  CT scan of the chest was obtained and  showed some retrosternal edema posterior to the sternum that ran the  length of the sternum and was suspicious for possible developing  abscess.  There was some bone resorption on the edges of the sternotomy  defect.  Further examination of the wound over the ensuing days showed  increasing drainage and it was obvious that this would require  debridement in the operating room.  I was asked to debride the patient  by Dr. Dorris Fetch.  I examined the wound and discussed the operative  procedure with the patient including the likely need to open the entire  wound and remove the sternal wires.  I discussed alternatives, benefits,  and risks  including but not limited to bleeding, blood transfusion,  persistent infection, injury to the heart, need for further surgical  procedures to debride and close the defect with the patient.  We  discussed the and the possible long course of treating this wound  infection.  She understood and agreed to proceed.   OPERATIVE PROCEDURE:  The patient was taken to operating room, placed on  table supine position.  After induction of general endotracheal  anesthesia a Foley cath was placed in bladder using sterile technique.  Then the chest were prepped with Betadine soap and solution and draped  usual sterile manner.  The lower end of the sternal wound was opened  further.  It was quickly obvious that this wound infection continued  cephalad.  There was creamy white pus as well as well as the necrotic  fibrinous material at the level of the sternum.  This extended  superiorly up to the upper end of the sternum.  The lower end of the  sternum had separated due to lower three  wires pulling through the  sternum.  There was pus coming from beneath the sternum and I felt it  was necessary to remove all the sternal wires.  The sternal edges  inferiorly were separated by about 5 mm.  In the mid and upper portion  of the sternum, the sternum appeared adherent to the structures below  it.  The lower edges of the sternum were debrided with rongeurs and  there was some bleeding present although the lower most edges of the  sternum appeared friable and necrotic with no bleeding.  The patient did  ooze some blood and since she had been on Lovenox as well as aspirin.  Total blood loss about 50-100 mL.  The wound was packed with a saline  moistened Kerlix sponge to allow for hemostasis.  My plan would be to  keep her on the ventilator overnight and plan dressing change the  morning with insertion of a vac sponge.  We will consult plastic surgery  for further wound evaluation and possible closure  with  musculocutaneous flaps.  The sponge, needle and instrument counts  correct according to scrub nurses.  Dry sterile dressing applied over  the wound.  The patient was then transported to the post anesthesia care  unit intubated in satisfactory stable condition.      Evelene Croon, M.D.  Electronically Signed     BB/MEDQ  D:  09/17/2006  T:  09/17/2006  Job:  [id number]

## 2011-01-25 NOTE — H&P (Signed)
[name], [name] NO.:  [id number]   MEDICAL RECORD NO.:  [id number]          PATIENT TYPE:  INP   LOCATION:  6705                         FACILITY:  [facility]   PHYSICIAN:  Salvatore Decent. Dorris Fetch, M.D.DATE OF BIRTH:  1956-02-10   DATE OF ADMISSION:  09/14/2006  DATE OF DISCHARGE:                              HISTORY & PHYSICAL   CHIEF COMPLAINT:  Increased pain and drainage from sternal wound.   HISTORY OF PRESENT ILLNESS:  [name] is a 55 year old female who  underwent coronary artery bypass grafting x4 by Dr. Cornelius Moras August 06, 2006.  Her initial postoperative course was uncomplicated.  She saw Dr.  Cornelius Moras in the office a week or two ago, at which time there was some  drainage from the inferior aspect of her sternal incision and she was  treating this locally with peroxide.  Over the past 48 hours she has  noted increasing pain and drainage in that area.  She said that she  measured her temperature at 99.5 but has not had any shaking, chills, or  sweats.  She came into the emergency room today for further evaluation  and while here required morphine for pain.  She currently is more  comfortable.   PAST MEDICAL HISTORY:  Significant for:  1. Coronary artery disease status post coronary artery bypass      grafting.  2. Hypertension.  3. Hyperlipidemia.  4. Asthma.  5. Obstructive sleep apnea.  6. Tobacco abuse.  7. Hernia repair.  8. Bladder suspension.  9. Knee arthroscopies.   MEDICATIONS:  Medications as of her last office visit on August 26, 2006 were:  1. Simvastatin 40 mg daily.  2. Aspirin 325 mg daily.  3. Levothyroxine 125 mcg daily.  4. Lasix 20 mg daily.  5. KCl 10 mEq daily.  6. Zetia 10 mg daily.  7. Metoprolol 25 mg b.i.d.  8. Fluoxetine 20 mg b.i.d.   ALLERGIES:  1. PENICILLIN.  2. SULFA.   FAMILY HISTORY:  Noncontributory.   SOCIAL HISTORY:  She has a history of tobacco abuse, she is divorced,  she lives with her mother,  she is a Designer, multimedia for [employer].   REVIEW OF SYSTEMS:  See HPI, otherwise unremarkable.   PHYSICAL EXAMINATION:  GENERAL:  [name] is a mildly obese 55-year-  old white female in no acute distress.  VITAL SIGNS:  Blood pressure 100/52, pulse 84 and regular, respirations  20, temperature 99.6, 99% saturated on room air.  NEURO:  She alert and oriented x3.  She is appropriate and grossly  intact.  HEENT:  Within normal limits.  NECK:  Supple without adenopathy.  CHEST:  Clear breath sounds bilaterally.  CARDIAC:  Regular rate and rhythm, normal S1 S2.  Her sternal wound is  intact superiorly.  The inferior 1/3 there is 2 areas of skin  separation.  The most inferior part appears to be very superficial.  The  more superior part was probed and a pocket that was entered, there was  some murky serous fluid.  Cultures were sent.  There is no  extension  deep or significantly superiorly or inferiorly from this area.  The  wound was packed with 2 x 2 gauze.  The sternum itself is stable with  coughing and deep breathing maneuvers.  EXTREMITIES:  1+ edema.  CHEST X-RAY:  Shows mild edema, the wires are intact and inline.   IMPRESSION:  [name] is a 55-yeaer-old female patient of Dr. Barry Dienes  who presents with increasing pain and drainage from the inferior aspect  of her sternal wound.  She needs to be admitted for aggressive IV  antibiotics as well as IV pain control and local wound care.  Hopefully  this will remain very limited and she would be able to be switched to  oral antibiotics and discharged home over the next 2 to 3 days, but I do  not think we can take any chance with this, particularly with her  significant pain.  This may turn out to be more than it initially would  appear.           ______________________________  Salvatore Decent. Dorris Fetch, M.D.     SCH/MEDQ  D:  09/14/2006  T:  09/14/2006  Job:  [id number]   cc:   Salvatore Decent. Cornelius Moras, M.D.  Arturo Morton. Riley Kill, MD, [facility]

## 2011-01-25 NOTE — Discharge Summary (Signed)
[name], [name] NO.:  [id number]   MEDICAL RECORD NO.:  [id number]          PATIENT TYPE:  INP   LOCATION:  4733                         FACILITY:  [facility]   PHYSICIAN:  Veverly Fells. Excell Seltzer, MD  DATE OF BIRTH:  [birth year]   DATE OF ADMISSION:  07/03/2006  DATE OF DISCHARGE:  07/04/2006                           DISCHARGE SUMMARY - REFERRING   DISCHARGE DIAGNOSES:  1. Chest discomfort of uncertain etiology.  2. Moderate coronary artery disease with normal left ventricular function,      continue medical therapy.   History as noted below.   PROCEDURES PERFORMED:  Cardiac catheterization, July 04, 2006, by Dr.  Excell Seltzer.   SUMMARY OF HISTORY:  [name] is a 55 year old white female, who is well  known by Dr. Riley Kill, reoccurring chest discomfort thus her admission.  Please refer to Dr. Rosalyn Charters history and physical dictation.   HISTORY:  1. Her history is notable for prior angioplasty of the LAD with stent in      2007.  2. She also has history of obesity.  3. Hyperlipidemia.  4. Hypothyroidism   LABORATORY:  Chest x-ray from June 13, 2006, shows chronic changes of  sarcoidosis, no acute findings.  Admission weight was 227.4.  H&H 12.0,  34.5, normal indices, platelets 181, WBCs 5.4.  PTT 30, PT 13.8.  Sodium  140, potassium 4.3, BUN 15, creatinine 0.7.  Normal LFTs.  Glucose 102.  EKGs showed normal sinus rhythm, left axis deviation, LVH, nonspecific ST-T  wave changes.   HOSPITAL COURSE:  [name] was admitted to [facility] and  underwent cardiac catheterization on July 04, 2006, by Dr. Excell Seltzer.  According to Dr. Earmon Phoenix progress note, her EF was hyperdynamic with a 75%  ejection fraction.  He felt that she had moderate three-vessel coronary  artery disease.  However, her angiogram grossly appeared unchanged.  He  adjusted her medications and added Imdur 30 mg and recommended follow-up  with Dr. Riley Kill for continued medical  treatment.  Post sheath removal and  bedrest, she was ambulating in the halls without difficulty.  I removed the  dressing at the catheterization site.  This did not show any signs of  ecchymosis, hematoma or bruit.  Pulses were intact.  Thus she was discharged  home.   DISPOSITION:  [name] is discharged home, asked to maintain a low salt,  fat, cholesterol diet.   Her activity and wound care as per supplemental discharge sheet.   She was asked to bring all medications to all appointments.   1. Her metoprolol was increased from 25 mg to 50 mg, twice a day.  2. She also received a new prescription for Imdur 30 mg every day.  3. She is asked to continue on her simvastatin 40 mg q.h.s.  4. Aspirin 325 mg p.o. every day.  5. Levothyroxine 125 mcg every day.  6. Furosemide 20 mg every day.  7. KCl 20 mEq every day.  8. Fluoxetine 10 mg b.i.d.  9. Zetia 10 mg every day.  10.Plavix 75 every day.  11.Naprosyn 500 mg as previously.  12.Nitroglycerin 0.4 as needed.  13.Imdur 30 mg p.o. every day.   She will follow up with Dr. Rosalyn Charters PA on July 14, 2006 at 9:15 a.m.  since Dr. Riley Kill is not available.   Discharge time less than 30 minutes.      Joellyn Rued, PA-C      Veverly Fells. Excell Seltzer, MD  Electronically Signed    EW/MEDQ  D:  07/04/2006  T:  07/04/2006  Job:  [id number]   cc:   Arturo Morton. Riley Kill, MD, [facility]  Neta Mends. Fabian Sharp, MD

## 2011-01-25 NOTE — Consult Note (Signed)
NAMELEAANN, NEVILS NO.:  [id number]   MEDICAL RECORD NO.:  [id number]          PATIENT TYPE:  INP   LOCATION:  2315                         FACILITY:  [facility]   PHYSICIAN:  Antonietta Breach, M.D.  DATE OF BIRTH:  [birth year]   DATE OF CONSULTATION:  09/22/2006  DATE OF DISCHARGE:                                 CONSULTATION   FOLLOWUP CONSULTATION   Ms. Dishon is much more improved.  She is no longer having  hallucinations or delusions.  She is not having any adverse psychotropic  effects.  She is able to not catastrophize about her future general  medical care and condition.  Also, she is not looking back with self-  condemnation regarding her past life.  She is able to enjoy visits with  a friend.  She is not having any thoughts of harming herself or others.  She is cooperative and socially appropriate with medical care.   LABORATORY DATA:  WBC 4.0, hemoglobin 10.9, platelet count 219.  The INR  is 1.2.  The metabolic panel is unremarkable.  Calcium was 8.4.   EXAMINATION:  VITAL SIGNS:  Temperature 98.8.  Pulse 71.  Respirations  is 22.  Blood pressure is 122/75.   MENTAL STATUS EXAM:  Ms. Perlow has good eye contact.  She is lying in a  partially reclined position in her ICU hospital bed.  She is oriented to  all spheres.  Her memory is intact to immediate, recent and remote.  Her  affect is calm.  Thought process is coherent.  Thought content:  No  thoughts of harming herself.  No thoughts of harming others.  No  delusions.  No hallucinations.  Judgment is intact.  Insight is good.   ASSESSMENT:  1. 293.81 psychotic disorder, not otherwise specified.  2. 293.84 anxiety disorder, not otherwise specified.  3. Major depression.   The undersigned provided ego supportive psychotherapy and education.   RECOMMENDATIONS:  1. Would continue with the Prozac 20 mg b.i.d. for antianxiety, as      well as antidepression.  2. Would also continue with Seroquel 50  mg b.i.d. for antipsychosis.  3. Xanax 0.25 mg q.4 hours p.r.n. mild anxiety.  4. Preliminary discharge planning:  Would ask the case manager to set      this patient up with her psychiatrist of choice upon discharge.      Other options include the [facility], [facility] or [facility].     Antonietta Breach, M.D.  Electronically Signed    JW/MEDQ  D:  09/24/2006  T:  09/24/2006  Job:  [id number]

## 2011-01-31 ENCOUNTER — Ambulatory Visit: Payer: PRIVATE HEALTH INSURANCE | Admitting: Occupational Therapy

## 2011-01-31 ENCOUNTER — Ambulatory Visit (INDEPENDENT_AMBULATORY_CARE_PROVIDER_SITE_OTHER): Payer: PRIVATE HEALTH INSURANCE | Admitting: Internal Medicine

## 2011-01-31 ENCOUNTER — Telehealth: Payer: Self-pay | Admitting: Internal Medicine

## 2011-01-31 ENCOUNTER — Encounter: Payer: Self-pay | Admitting: Internal Medicine

## 2011-01-31 VITALS — BP 120/80 | Temp 98.5°F | Wt 204.0 lb

## 2011-01-31 DIAGNOSIS — I6350 Cerebral infarction due to unspecified occlusion or stenosis of unspecified cerebral artery: Secondary | ICD-10-CM

## 2011-01-31 DIAGNOSIS — L538 Other specified erythematous conditions: Secondary | ICD-10-CM

## 2011-01-31 DIAGNOSIS — R350 Frequency of micturition: Secondary | ICD-10-CM

## 2011-01-31 DIAGNOSIS — E039 Hypothyroidism, unspecified: Secondary | ICD-10-CM

## 2011-01-31 DIAGNOSIS — I2510 Atherosclerotic heart disease of native coronary artery without angina pectoris: Secondary | ICD-10-CM

## 2011-01-31 DIAGNOSIS — R252 Cramp and spasm: Secondary | ICD-10-CM

## 2011-01-31 DIAGNOSIS — G4733 Obstructive sleep apnea (adult) (pediatric): Secondary | ICD-10-CM

## 2011-01-31 DIAGNOSIS — L304 Erythema intertrigo: Secondary | ICD-10-CM

## 2011-01-31 DIAGNOSIS — M62838 Other muscle spasm: Secondary | ICD-10-CM

## 2011-01-31 DIAGNOSIS — F4321 Adjustment disorder with depressed mood: Secondary | ICD-10-CM

## 2011-01-31 DIAGNOSIS — I10 Essential (primary) hypertension: Secondary | ICD-10-CM

## 2011-01-31 DIAGNOSIS — I63511 Cerebral infarction due to unspecified occlusion or stenosis of right middle cerebral artery: Secondary | ICD-10-CM

## 2011-01-31 LAB — POCT URINALYSIS DIPSTICK
Glucose, UA: NEGATIVE
Ketones, UA: NEGATIVE
Spec Grav, UA: 1.03
Urobilinogen, UA: 0.2

## 2011-01-31 LAB — BASIC METABOLIC PANEL
BUN: 12 mg/dL (ref 6–23)
CO2: 28 mEq/L (ref 19–32)
GFR: 63.77 mL/min (ref >60.00)
Glucose, Bld: 102 mg/dL — ABNORMAL HIGH (ref 70–99)
Potassium: 3.5 mEq/L (ref 3.5–5.1)

## 2011-01-31 MED ORDER — KETOCONAZOLE 2 % EX CREA: TOPICAL_CREAM | Freq: Two times a day (BID) | CUTANEOUS | Status: DC

## 2011-01-31 MED ORDER — NITROFURANTOIN MONOHYD MACRO 100 MG PO CAPS: 100.0000 mg | ORAL_CAPSULE | Freq: Two times a day (BID) | ORAL | Status: AC

## 2011-01-31 MED ORDER — VENLAFAXINE HCL ER 75 MG PO CP24: 75.0000 mg | ORAL_CAPSULE | Freq: Every day | ORAL | Status: DC

## 2011-01-31 NOTE — Progress Notes (Signed)
  Subjective:    Patient ID: [name], female    DOB: 08-Oct-1955, 55 y.o.   MRN: [id number]  HPI Patient comes in today is an acute care visit. She has a number of  Concerns to address.She is initially accompanied by her son who helped drive her #Charley horses legs at night and all over Including abdomen and chest..  postional numbness in legs. After sitting a long time. Mostly when she's on the toilet. # UTI symptoms have not been resolved after taking the Ceftin. Her sinus infection is better. # has a vaginal yeast symptom which has responded to over-the-counter Monistat. # she has a rash under her skin folds on the left lower abdomen that is been difficult to treat. It is now odiferous and weepy. Some itching some pain. She has used Lotrimin on it. #She is out of her affects are for almost a week. She does not have an appointment with her physical medicine doctor who prescribes until next week. # she has needs for refills of the number of medications from her specialists including old tram Plavix and the rest role which was given to her in the nursing home rehab.  Her last laboratory tests were over a month ago .  She has some itching all over that is not new. Was taking Benadryl and switch to hydroxyzine. # she may need refills on her new vigil that got changed to Provigil per Dr. Maple Hudson.  Review of Systems Negative for chest pain that is new except for her anterior chest pain no change in shortness of breath sinus congestion is better. No incontinence headache fever. Rest as per HPI  Past history family history social history reviewed in the electronic medical record.     Objective:   Physical Exam Well developed well-nourished with mild paresis  but no obvious spasms abnormal movements.  HEENT: nose congestion is improved  Neck no masses Chest:  Clear to A&P without wheezes rales or rhonchi CV:  S1-S2 no gallops or murmurs peripheral perfusion is normal Legs  No spasm or  fasiculations walks with a cane. Speech somehat flat but  Coherent and articulate.  Skin:  A large area of intertrigo on the left lower abdomen. This is moist but non  Blisters. Review of lab s     Assessment & Plan:  Muscle    Spasms  More than leg cramps  Could be multiple causes consider drug withdrawal effects   Effexor?  She is taking Zanaflex three times a day at the discharge after her cva  S/p CVA CAD Intertrigo   Disc rx and management keep dry and new antifungal  Poss uti not resolved. after  Ceftin.   The cx showed multiple spps OSA Polypharmacy multiple medicines still a bit confusing about who is prescribing which medications.  At this time we will refill her effexor  To avoid withdrawal symptoms.. Check electrolytes and magnesium in the meantime Urine culture and begin another antibiotic. ITching  ? Cause   But apparently predated discharge from hospital.

## 2011-01-31 NOTE — Telephone Encounter (Signed)
Pls advise status of this form thanks

## 2011-01-31 NOTE — Patient Instructions (Addendum)
Keep area dry  And add different  Anti fungal.   Change antibioitc for UTI sx  Will notify you  of labs when available. Will refill effexor for once   To avoid withdrawal. Have Dr Hezzie Bump the rest of spasm pain management and meds   Management.

## 2011-02-01 ENCOUNTER — Telehealth: Payer: Self-pay | Admitting: Internal Medicine

## 2011-02-01 ENCOUNTER — Telehealth: Payer: Self-pay | Admitting: Cardiology

## 2011-02-01 ENCOUNTER — Ambulatory Visit: Payer: PRIVATE HEALTH INSURANCE | Admitting: Occupational Therapy

## 2011-02-01 ENCOUNTER — Other Ambulatory Visit: Payer: Self-pay

## 2011-02-01 DIAGNOSIS — F331 Major depressive disorder, recurrent, moderate: Secondary | ICD-10-CM

## 2011-02-01 LAB — URINE CULTURE: Colony Count: >=100000

## 2011-02-01 MED ORDER — CLOPIDOGREL BISULFATE 75 MG PO TABS: 75.0000 mg | ORAL_TABLET | Freq: Every day | ORAL | Status: DC

## 2011-02-01 MED ORDER — MAGNESIUM CHLORIDE 64 MG PO TBEC: DELAYED_RELEASE_TABLET | ORAL | Status: DC

## 2011-02-01 NOTE — Telephone Encounter (Signed)
Refill plavix 75 mg.  [facility] on [address] [phone]

## 2011-02-01 NOTE — Telephone Encounter (Signed)
RX sent into pharmacy. PT notified. 

## 2011-02-05 ENCOUNTER — Other Ambulatory Visit: Payer: Self-pay | Admitting: Internal Medicine

## 2011-02-05 ENCOUNTER — Telehealth: Payer: Self-pay | Admitting: Internal Medicine

## 2011-02-05 ENCOUNTER — Other Ambulatory Visit (INDEPENDENT_AMBULATORY_CARE_PROVIDER_SITE_OTHER): Payer: PRIVATE HEALTH INSURANCE

## 2011-02-05 ENCOUNTER — Ambulatory Visit: Payer: PRIVATE HEALTH INSURANCE | Admitting: Physical Medicine & Rehabilitation

## 2011-02-05 LAB — CALCIUM: Calcium: 9.1 mg/dL (ref 8.4–10.5)

## 2011-02-05 LAB — MAGNESIUM: Magnesium: 1.4 mg/dL — ABNORMAL LOW (ref 1.5–2.5)

## 2011-02-05 MED ORDER — ARMODAFINIL 150 MG PO TABS: 150.0000 mg | ORAL_TABLET | Freq: Every day | ORAL | Status: DC

## 2011-02-05 MED ORDER — TEMAZEPAM 7.5 MG PO CAPS: 7.5000 mg | ORAL_CAPSULE | Freq: Every evening | ORAL | Status: DC | PRN

## 2011-02-05 NOTE — Telephone Encounter (Signed)
Spoke with patient-states she now has insurance and would like for CY to call in Rx's for Nuvigil and Temazepam. CY has okayed for patient to have both Rx's as requested sent to [facility] on [address].

## 2011-02-05 NOTE — Telephone Encounter (Signed)
I spoke with ION and informed them we are waiting on paper chart to come from [facility] to get old sleep study to fax with paperwork-they are aware this could take a few days but I will hold the message and fax asap.

## 2011-02-06 ENCOUNTER — Ambulatory Visit: Payer: PRIVATE HEALTH INSURANCE | Admitting: Physical Therapy

## 2011-02-06 ENCOUNTER — Telehealth: Payer: Self-pay | Admitting: *Deleted

## 2011-02-06 ENCOUNTER — Ambulatory Visit: Payer: PRIVATE HEALTH INSURANCE | Admitting: Occupational Therapy

## 2011-02-06 NOTE — Telephone Encounter (Signed)
Pt aware and appt made.

## 2011-02-06 NOTE — Procedures (Signed)
[name], [name] NO.:  [id number]  MEDICAL RECORD NO.:  [id number]           PATIENT TYPE:  O  LOCATION:  TPC                          FACILITY:  [facility]  PHYSICIAN:  Erick Colace, M.D.DATE OF BIRTH:  23-May-1956  DATE OF PROCEDURE: DATE OF DISCHARGE:                              OPERATIVE REPORT  This is a left subacromial bursa injection.  INDICATION:  Left subacromial bursitis status post stroke causing left hemiparesis.  Pain is only partially responsive to medication management.  She is not a good candidate for nonsteroidals due to recent stroke.  Informed consent was obtained after describing risks and benefits of the procedure with the patient.  These include bleeding, bruising, infection.  She elects to proceed and has given written consent. Posterolateral approach utilized.  Area marked and prepped with Betadine alcohol, entered with 25-gauge inch and half needle.  After negative drawback for blood, 1 mL of 40 mg/mL Depo-Medrol and 4 mL of 1% lidocaine injected.  The patient tolerated procedure well. Postprocedure instructions given.     Erick Colace, M.D. Electronically Signed    AEK/MEDQ  D:  02/05/2011 15:00:47  T:  02/06/2011 03:17:24  Job:  [id number]

## 2011-02-06 NOTE — Telephone Encounter (Signed)
I am not finding this form. If we haven't sent it yet, then we need to ask for another copy.

## 2011-02-06 NOTE — Telephone Encounter (Signed)
Message copied by Romualdo Bolk on Wed Feb 06, 2011  1:47 PM ------      Message from: [facility] [place] [facility]      Created: Tue Feb 05, 2011  5:49 PM       Tell patient that her magnesium level is better  but still slightly below normal.    ? Are her spasms better?       . Continue on the medication slo mag  4 -6 pills a day.      Recheck magnesium level  in another    7 days.

## 2011-02-06 NOTE — Assessment & Plan Note (Signed)
Please refer to health and history form.  CHIEF COMPLAINT:  Left shoulder pain.  SECONDARY COMPLAINT:  Right-sided chest pain.  A 55 year old female who has had CVA, right MCA distribution infarct, left hemiparesis, left shoulder pain.  She has gone through inpatient rehab from November 13, 2010, to December 04, 2010, discharged initially to home health, but now with outpatient.  SOCIAL HISTORY:  Has a disagreement with her sister, moved out of sister's home, lives in extended stay, but has a new apartment that she is moving into on Friday.  Her average pain is 7/10 in the left shoulder, interferes with sleep. Pain improves with rest and medication and relief from meds is only fair.  VOCATIONAL:  She is on short-term disability as business Warden/ranger. She would like to return to work when possible.  REVIEW OF SYSTEMS:  Positive for numbness, tremor, depression anxiety, spasms, fever, constipation, painful urination, abdominal pain, poor appetite, coughing, shortness breath, sleep apnea.  She has been fitted for CPAP.  Other providers include Dr. Eula Flax from Neuropsychology.  PHYSICAL EXAMINATION:  VITAL SIGNS:  Blood pressure 130/83, pulse 65, respirations 18, O2 sat 96% on room air. GENERAL:  No acute distress.  Mood and affect appropriate. MUSCULOSKELETAL:  Her left shoulder has positive impingement sign. Right chest has some tenderness around the pectoralis as well as the costochondral junction.  She has normal strength in right upper extremity.  Left upper extremity is 3/5 strength in deltoid, biceps, triceps grip and 4- in the hip flexor, knee extensor, and ankle dorsiflexor.  IMPRESSION: 1. Right subcortical infarct with left hemiparesis. 2. Left post-stroke shoulder pain. 3. Left subacromial bursitis. 4. Costochondritis on the right.  PLAN: 1. We will do injection, left shoulder. 2. Counter irritant cream on the right chest followed by stretches     that I  instructed how to do corner stretching of the pectoralis     muscles.  If this is not helpful, may trial some tramadol, avoid     nonsteroidals given her history of CVA, consider topical NSAIDs.  Discussed with patient, agrees with plan.     Erick Colace, M.D. Electronically Signed    AEK/MedQ D:  02/05/2011 14:59:43  T:  02/06/2011 03:14:47  Job #:  [id number]  cc:   Neta Mends. Fabian Sharp, MD [address] [place], [place] [address]  Arturo Morton. Riley Kill, MD, [facility] [address] [place] [address]

## 2011-02-07 ENCOUNTER — Telehealth: Payer: Self-pay | Admitting: Cardiology

## 2011-02-07 NOTE — Telephone Encounter (Signed)
This form and sleep study have been faxed back.

## 2011-02-07 NOTE — Telephone Encounter (Signed)
I spoke with the pt and today is the first day that she has checked her BP at home and it was high.  The pt was seen last week by PCP and BP was normal.  I instructed the pt to check her BP on a daily basis and keep a record.  I also made the pt aware of her heart monitor results.  I scheduled the pt to follow-up with Dr Riley Kill on 02/13/11.  The pt will bring her BP readings and cuff into the office (check for accuracy). The pt agreed with plan and will contact our office with any other questions or concerns.

## 2011-02-07 NOTE — Telephone Encounter (Signed)
Pt wanted report for moniter she wore last month and her b/p is running 164/95 and she wants to know what she can do

## 2011-02-07 NOTE — Telephone Encounter (Signed)
This has been faxed to [facility] already as well with sleep study results.

## 2011-02-08 ENCOUNTER — Emergency Department (HOSPITAL_COMMUNITY)
Admission: EM | Admit: 2011-02-08 | Discharge: 2011-02-08 | Disposition: A | Discharge status: Home / Self Care | Payer: PRIVATE HEALTH INSURANCE | Attending: Emergency Medicine | Admitting: Emergency Medicine

## 2011-02-08 ENCOUNTER — Ambulatory Visit: Payer: PRIVATE HEALTH INSURANCE | Admitting: Physical Therapy

## 2011-02-08 ENCOUNTER — Telehealth: Payer: Self-pay | Admitting: Internal Medicine

## 2011-02-08 ENCOUNTER — Ambulatory Visit: Payer: PRIVATE HEALTH INSURANCE | Attending: Internal Medicine | Admitting: Occupational Therapy

## 2011-02-08 ENCOUNTER — Emergency Department (HOSPITAL_COMMUNITY): Payer: PRIVATE HEALTH INSURANCE

## 2011-02-08 DIAGNOSIS — R5381 Other malaise: Secondary | ICD-10-CM | POA: Insufficient documentation

## 2011-02-08 DIAGNOSIS — R29810 Facial weakness: Secondary | ICD-10-CM | POA: Insufficient documentation

## 2011-02-08 DIAGNOSIS — R5383 Other fatigue: Secondary | ICD-10-CM | POA: Insufficient documentation

## 2011-02-08 DIAGNOSIS — N390 Urinary tract infection, site not specified: Secondary | ICD-10-CM | POA: Insufficient documentation

## 2011-02-08 DIAGNOSIS — R29898 Other symptoms and signs involving the musculoskeletal system: Secondary | ICD-10-CM | POA: Insufficient documentation

## 2011-02-08 DIAGNOSIS — R269 Unspecified abnormalities of gait and mobility: Secondary | ICD-10-CM | POA: Insufficient documentation

## 2011-02-08 DIAGNOSIS — I2510 Atherosclerotic heart disease of native coronary artery without angina pectoris: Secondary | ICD-10-CM | POA: Insufficient documentation

## 2011-02-08 DIAGNOSIS — R279 Unspecified lack of coordination: Secondary | ICD-10-CM | POA: Insufficient documentation

## 2011-02-08 DIAGNOSIS — I69919 Unspecified symptoms and signs involving cognitive functions following unspecified cerebrovascular disease: Secondary | ICD-10-CM | POA: Insufficient documentation

## 2011-02-08 DIAGNOSIS — E789 Disorder of lipoprotein metabolism, unspecified: Secondary | ICD-10-CM | POA: Insufficient documentation

## 2011-02-08 DIAGNOSIS — Z8673 Personal history of transient ischemic attack (TIA), and cerebral infarction without residual deficits: Secondary | ICD-10-CM | POA: Insufficient documentation

## 2011-02-08 DIAGNOSIS — I10 Essential (primary) hypertension: Secondary | ICD-10-CM | POA: Insufficient documentation

## 2011-02-08 DIAGNOSIS — M6281 Muscle weakness (generalized): Secondary | ICD-10-CM | POA: Insufficient documentation

## 2011-02-08 DIAGNOSIS — Z5189 Encounter for other specified aftercare: Secondary | ICD-10-CM | POA: Insufficient documentation

## 2011-02-08 DIAGNOSIS — I69998 Other sequelae following unspecified cerebrovascular disease: Secondary | ICD-10-CM | POA: Insufficient documentation

## 2011-02-08 DIAGNOSIS — R4789 Other speech disturbances: Secondary | ICD-10-CM | POA: Insufficient documentation

## 2011-02-08 DIAGNOSIS — E039 Hypothyroidism, unspecified: Secondary | ICD-10-CM | POA: Insufficient documentation

## 2011-02-08 DIAGNOSIS — Z951 Presence of aortocoronary bypass graft: Secondary | ICD-10-CM | POA: Insufficient documentation

## 2011-02-08 DIAGNOSIS — I509 Heart failure, unspecified: Secondary | ICD-10-CM | POA: Insufficient documentation

## 2011-02-08 DIAGNOSIS — Z79899 Other long term (current) drug therapy: Secondary | ICD-10-CM | POA: Insufficient documentation

## 2011-02-08 LAB — DIFFERENTIAL
Basophils Relative: 0 % (ref 0–1)
Eosinophils Absolute: 0.6 10*3/uL (ref 0.0–0.7)
Eosinophils Relative: 7 % — ABNORMAL HIGH (ref 0–5)
Neutrophils Relative %: 68 % (ref 43–77)

## 2011-02-08 LAB — COMPREHENSIVE METABOLIC PANEL
Albumin: 3.9 g/dL (ref 3.5–5.2)
Alkaline Phosphatase: 90 U/L (ref 39–117)
BUN: 15 mg/dL (ref 6–23)
Chloride: 102 mEq/L (ref 96–112)
Creatinine, Ser: 0.85 mg/dL (ref 0.4–1.2)
Glucose, Bld: 81 mg/dL (ref 70–99)
Potassium: 3.9 mEq/L (ref 3.5–5.1)
Total Bilirubin: 0.3 mg/dL (ref 0.3–1.2)

## 2011-02-08 LAB — TROPONIN I: Troponin I: <0.3 ng/mL (ref <0.30)

## 2011-02-08 LAB — CBC
MCH: 30.2 pg (ref 26.0–34.0)
MCV: 85.5 fL (ref 78.0–100.0)
Platelets: 211 10*3/uL (ref 150–400)
RBC: 4.01 MIL/uL (ref 3.87–5.11)
RDW: 12.3 % (ref 11.5–15.5)
WBC: 8.3 10*3/uL (ref 4.0–10.5)

## 2011-02-08 LAB — URINE MICROSCOPIC-ADD ON

## 2011-02-08 LAB — POCT I-STAT, CHEM 8
Calcium, Ion: 1.27 mmol/L (ref 1.12–1.32)
Glucose, Bld: 81 mg/dL (ref 70–99)
HCT: 35 % — ABNORMAL LOW (ref 36.0–46.0)
Hemoglobin: 11.9 g/dL — ABNORMAL LOW (ref 12.0–15.0)
TCO2: 29 mmol/L (ref 0–100)

## 2011-02-08 LAB — CK TOTAL AND CKMB (NOT AT ARMC)
CK, MB: 1.4 ng/mL (ref 0.3–4.0)
Relative Index: INVALID (ref 0.0–2.5)

## 2011-02-08 LAB — URINALYSIS, ROUTINE W REFLEX MICROSCOPIC
Bilirubin Urine: NEGATIVE
Specific Gravity, Urine: 1.018 (ref 1.005–1.030)
Urobilinogen, UA: 0.2 mg/dL (ref 0.0–1.0)

## 2011-02-08 NOTE — Telephone Encounter (Signed)
Katie pls track this down and see what is needed so we can fax it back, thanks

## 2011-02-08 NOTE — Telephone Encounter (Signed)
Spoke with Wendi at [facility] that we needed to check the box at bottom of page stating Administrator, Civil Service. This has been done and re-faxed.[name]

## 2011-02-09 LAB — URINE CULTURE: Colony Count: >=100000

## 2011-02-10 ENCOUNTER — Emergency Department (HOSPITAL_COMMUNITY): Payer: PRIVATE HEALTH INSURANCE

## 2011-02-10 ENCOUNTER — Inpatient Hospital Stay (HOSPITAL_COMMUNITY)
Admission: EM | Admit: 2011-02-10 | Discharge: 2011-02-14 | DRG: 315 | Disposition: A | Discharge status: Home / Self Care | Payer: PRIVATE HEALTH INSURANCE | Attending: Internal Medicine | Admitting: Internal Medicine

## 2011-02-10 DIAGNOSIS — I10 Essential (primary) hypertension: Secondary | ICD-10-CM | POA: Diagnosis present

## 2011-02-10 DIAGNOSIS — I69959 Hemiplegia and hemiparesis following unspecified cerebrovascular disease affecting unspecified side: Secondary | ICD-10-CM

## 2011-02-10 DIAGNOSIS — Z7902 Long term (current) use of antithrombotics/antiplatelets: Secondary | ICD-10-CM

## 2011-02-10 DIAGNOSIS — N390 Urinary tract infection, site not specified: Secondary | ICD-10-CM | POA: Diagnosis present

## 2011-02-10 DIAGNOSIS — J449 Chronic obstructive pulmonary disease, unspecified: Secondary | ICD-10-CM | POA: Diagnosis present

## 2011-02-10 DIAGNOSIS — I69992 Facial weakness following unspecified cerebrovascular disease: Secondary | ICD-10-CM

## 2011-02-10 DIAGNOSIS — G4733 Obstructive sleep apnea (adult) (pediatric): Secondary | ICD-10-CM | POA: Diagnosis present

## 2011-02-10 DIAGNOSIS — Z9861 Coronary angioplasty status: Secondary | ICD-10-CM

## 2011-02-10 DIAGNOSIS — R471 Dysarthria and anarthria: Secondary | ICD-10-CM | POA: Diagnosis present

## 2011-02-10 DIAGNOSIS — Z79899 Other long term (current) drug therapy: Secondary | ICD-10-CM

## 2011-02-10 DIAGNOSIS — E785 Hyperlipidemia, unspecified: Secondary | ICD-10-CM | POA: Diagnosis present

## 2011-02-10 DIAGNOSIS — Z5900 Homelessness unspecified: Secondary | ICD-10-CM

## 2011-02-10 DIAGNOSIS — J4489 Other specified chronic obstructive pulmonary disease: Secondary | ICD-10-CM | POA: Diagnosis present

## 2011-02-10 DIAGNOSIS — I2510 Atherosclerotic heart disease of native coronary artery without angina pectoris: Secondary | ICD-10-CM | POA: Diagnosis present

## 2011-02-10 DIAGNOSIS — Z951 Presence of aortocoronary bypass graft: Secondary | ICD-10-CM

## 2011-02-10 DIAGNOSIS — E039 Hypothyroidism, unspecified: Secondary | ICD-10-CM | POA: Diagnosis present

## 2011-02-10 DIAGNOSIS — I498 Other specified cardiac arrhythmias: Secondary | ICD-10-CM | POA: Diagnosis present

## 2011-02-10 DIAGNOSIS — I959 Hypotension, unspecified: Principal | ICD-10-CM | POA: Diagnosis present

## 2011-02-10 DIAGNOSIS — T448X5A Adverse effect of centrally-acting and adrenergic-neuron-blocking agents, initial encounter: Secondary | ICD-10-CM | POA: Diagnosis present

## 2011-02-10 LAB — CBC
HCT: 33.7 % — ABNORMAL LOW (ref 36.0–46.0)
Hemoglobin: 12 g/dL (ref 12.0–15.0)
MCH: 30.1 pg (ref 26.0–34.0)
MCHC: 35.6 g/dL (ref 30.0–36.0)
MCV: 84.5 fL (ref 78.0–100.0)

## 2011-02-10 LAB — DIFFERENTIAL
Basophils Relative: 0 % (ref 0–1)
Lymphocytes Relative: 18 % (ref 12–46)
Lymphs Abs: 1.4 10*3/uL (ref 0.7–4.0)
Monocytes Absolute: 0.7 10*3/uL (ref 0.1–1.0)
Monocytes Relative: 9 % (ref 3–12)
Neutro Abs: 5.1 10*3/uL (ref 1.7–7.7)

## 2011-02-10 LAB — PRO B NATRIURETIC PEPTIDE: Pro B Natriuretic peptide (BNP): 238.8 pg/mL — ABNORMAL HIGH (ref 0–125)

## 2011-02-10 LAB — BASIC METABOLIC PANEL
Calcium: 9.3 mg/dL (ref 8.4–10.5)
Creatinine, Ser: 0.92 mg/dL (ref 0.4–1.2)
GFR calc Af Amer: >60 mL/min (ref >60)
GFR calc non Af Amer: >60 mL/min (ref >60)
Sodium: 138 mEq/L (ref 135–145)

## 2011-02-10 LAB — CK TOTAL AND CKMB (NOT AT ARMC)
CK, MB: 1.5 ng/mL (ref 0.3–4.0)
Total CK: 49 U/L (ref 7–177)

## 2011-02-10 LAB — TROPONIN I: Troponin I: <0.3 ng/mL (ref <0.30)

## 2011-02-11 ENCOUNTER — Inpatient Hospital Stay (HOSPITAL_COMMUNITY): Payer: PRIVATE HEALTH INSURANCE

## 2011-02-11 DIAGNOSIS — I517 Cardiomegaly: Secondary | ICD-10-CM

## 2011-02-11 LAB — COMPREHENSIVE METABOLIC PANEL
ALT: 8 U/L (ref 0–35)
AST: 11 U/L (ref 0–37)
Calcium: 8.8 mg/dL (ref 8.4–10.5)
Creatinine, Ser: 0.82 mg/dL (ref 0.4–1.2)
GFR calc Af Amer: >60 mL/min (ref >60)
GFR calc non Af Amer: >60 mL/min (ref >60)
Glucose, Bld: 99 mg/dL (ref 70–99)
Sodium: 140 mEq/L (ref 135–145)
Total Protein: 6 g/dL (ref 6.0–8.3)

## 2011-02-11 LAB — URINALYSIS, MICROSCOPIC ONLY
Bilirubin Urine: NEGATIVE
Glucose, UA: NEGATIVE mg/dL
Specific Gravity, Urine: 1.014 (ref 1.005–1.030)
Urobilinogen, UA: 0.2 mg/dL (ref 0.0–1.0)

## 2011-02-11 LAB — GLUCOSE, CAPILLARY: Glucose-Capillary: 98 mg/dL (ref 70–99)

## 2011-02-11 LAB — LIPID PANEL
HDL: 40 mg/dL (ref >39)
LDL Cholesterol: 75 mg/dL (ref 0–99)
Total CHOL/HDL Ratio: 3.4 RATIO
Triglycerides: 94 mg/dL (ref <150)

## 2011-02-11 LAB — HEMOGLOBIN A1C
Hgb A1c MFr Bld: 5.4 % (ref <5.7)
Mean Plasma Glucose: 108 mg/dL (ref <117)

## 2011-02-12 ENCOUNTER — Ambulatory Visit: Payer: PRIVATE HEALTH INSURANCE | Admitting: Occupational Therapy

## 2011-02-12 ENCOUNTER — Telehealth: Payer: Self-pay | Admitting: Internal Medicine

## 2011-02-12 ENCOUNTER — Ambulatory Visit: Payer: PRIVATE HEALTH INSURANCE | Admitting: Physical Therapy

## 2011-02-12 ENCOUNTER — Encounter: Payer: Self-pay | Admitting: Cardiology

## 2011-02-12 DIAGNOSIS — I498 Other specified cardiac arrhythmias: Secondary | ICD-10-CM

## 2011-02-12 LAB — URINE CULTURE
Colony Count: NO GROWTH
Culture  Setup Time: 201206041225
Culture: NO GROWTH

## 2011-02-12 LAB — GLUCOSE, CAPILLARY: Glucose-Capillary: 84 mg/dL (ref 70–99)

## 2011-02-12 NOTE — Telephone Encounter (Signed)
Received PA form from [employer].  Hamilton Capri at [phone], spoke with Weston Brass.  He will fax form to triage fax #.  Will await fax.

## 2011-02-12 NOTE — Telephone Encounter (Signed)
Form received and placed on CY's cart.  Will forward message to him so he is aware.

## 2011-02-12 NOTE — Telephone Encounter (Signed)
Called, spoke with pt.  State nuvigil needs PA.  Advised we will get the process started for this and could take up to 2 wks.  Pt verbalized understanding of this.  I do not see PA form in triage.  Called [employer], they will refax to triage.

## 2011-02-13 ENCOUNTER — Ambulatory Visit: Payer: PRIVATE HEALTH INSURANCE | Admitting: Cardiology

## 2011-02-13 ENCOUNTER — Other Ambulatory Visit: Payer: PRIVATE HEALTH INSURANCE

## 2011-02-13 LAB — GLUCOSE, CAPILLARY
Glucose-Capillary: 89 mg/dL (ref 70–99)
Glucose-Capillary: 97 mg/dL (ref 70–99)

## 2011-02-13 NOTE — Telephone Encounter (Signed)
I have faxed the needed information to the insurance company-papers are in Triage waiting for approval/denial.

## 2011-02-14 ENCOUNTER — Telehealth: Payer: Self-pay | Admitting: *Deleted

## 2011-02-14 LAB — GLUCOSE, CAPILLARY: Glucose-Capillary: 79 mg/dL (ref 70–99)

## 2011-02-14 NOTE — Telephone Encounter (Signed)
I called patient at [phone](hospital room) to find out what troubles she is having with her CPAP mask fitting. I was unable to reach patient and will need to call back later.   If trouble with mask while in hospital she needs to discuss with her nurse-as they can have Resp help with this. If trouble with mask at home then we can place order for her DME company to help pt with fitting of mask.

## 2011-02-15 ENCOUNTER — Ambulatory Visit: Payer: PRIVATE HEALTH INSURANCE | Admitting: *Deleted

## 2011-02-15 ENCOUNTER — Encounter: Payer: PRIVATE HEALTH INSURANCE | Admitting: Occupational Therapy

## 2011-02-18 NOTE — Telephone Encounter (Signed)
Per e-chart, pt was discharged from [facility] on 6.7.12.  LMOM TCB x1 for pt to see if anything further is needed.  Per discharge, pt is to follow up w/ CDY, though no time frame is provided.  No pending appts with CDY.

## 2011-02-19 ENCOUNTER — Ambulatory Visit: Payer: PRIVATE HEALTH INSURANCE | Admitting: Occupational Therapy

## 2011-02-19 ENCOUNTER — Ambulatory Visit: Payer: PRIVATE HEALTH INSURANCE | Admitting: Physical Therapy

## 2011-02-20 NOTE — Consult Note (Signed)
[name], [name] NO.:  [id number]  MEDICAL RECORD NO.:  [id number]  LOCATION:  2508                         FACILITY:  [facility]  PHYSICIAN:  Levie Heritage, MD       DATE OF BIRTH:  06/27/1956  DATE OF CONSULTATION:  02/11/2011 DATE OF DISCHARGE:                                CONSULTATION   REASON FOR CONSULTATION:  Questionable new stroke in the setting of slurred speech.  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  The patient states that approximately 4 weeks ago, she was diagnosed with the UTI and 3 weeks ago, she had a transient period of slurred speech.  This transient slurred speech only lasted for about 15 minutes and then resolved.  On February 09, 2011, the patient noted a second 30-40-minute period of slurred speech that has now fully resolved.  During this episode, she states that she had a sensation with generalized weakness.  During this time period, the patient denies any numbness, tingling, abnormal muscle movements, headache, or blurred vision.  The patient states that the slurred speech was very much like the symptoms she had when she had had her previous stroke which was back in March 2012.  At that time, the patient presented with left leg, left arm weakness, slurred speech, and headache.  At the time of arrival, the patient's speech had cleared, however, it should be noted that the patient was found to be in sinus bradycardia with a rate of 40 and her blood pressure being hypotensive at 90/50. She remains bradycardic at this time with a heart rate of 45.  She states that she has been on aspirin and Plavix for some time secondary to having cardiac stent.  She has had no problems with being on aspirin and Plavix.  Neurology was consulted for further  evaluation.  PAST MEDICAL HISTORY:  CAD, COPD, hypertension, right MCA CVA, hypothyroidism.  PAST SURGICAL HISTORY:  Heart bypass and cardiac stent.  ALLERGIES:  PENICILLIN, SULFA, CODEINE, and AVELOX.  MEDICATIONS: 1. The patient is on PROVIGIL which she has not taken for about 10     days. 2. Lopressor 25 mg b.i.d. 3. Plavix. 4. Crestor. 5. Synthroid 137 mcg/day. 6. Temazepam. 7. Tramadol. 8. Zanaflex.  REVIEW OF SYSTEMS:  Otherwise, unremarkable for the 10 review of systems.  SOCIAL HISTORY:  She is currently homeless.  She lives in a hotel.  Her son has been taken care of her and she is anticipating an apartment in a week or two.  She denies drug use, tobacco, or alcohol use.  FAMILY HISTORY:  Noncontributory.  PHYSICAL EXAMINATION:  VITAL SIGNS:  Blood pressure is 110/63, heart rate is 48, temperature is 98.6, respirations 18. GENERAL:  The patient is alert and oriented x3.  Carries about 2- and 3- step commands without difficulty. NEUROLOGIC:  Pupils were equal, round, and reactive to light and accommodating.  Conjugate gaze.  Extraocular movements are intact. Vision is intact to double simultaneous stimuli.  Face shows a left  nasolabial and left corner of her mouth facial droop.  Tongue is midline.  Uvula is midline.  The patient shows no dysarthria, no aphasia.  No slurred speech.  Facial sensation is stated to be intact bilaterally.  Shoulder shrug, head turn is within normal limits. Coordination:  Finger-to-nose and heel-to-shin are smooth.  Fine motor movements smooth.  Motor:  The patient shows slight residual weakness with her left shoulder abduction, triceps extension, and left hip flexion, otherwise the patient shows a good strong 5/5 strength.  Deep tendon reflexes are 2+ throughout with equivocal toes.  The patient does show a drift in her left upper extremity, however, it should be noted that she is having some shoulder pain at this time.  Sensation is  stated to be intact to pinprick, light touch, vibration. PULMONARY:  Clear to auscultation. CARDIOVASCULAR:  S1 and S2 were audible. NECK:  Negative for bruits.  LABS:  Urinalysis was negative.  Urine culture pending.  TSH was 0.796. CMP shows sodium of 140, potassium 3.9, chloride 106, CO2 27, BUN 15, creatinine 0.82, glucose 99.  HbA1c is 5.4.  Cholesterol 134, triglycerides 94, HDL 40, LDL 75.  BNP shows 238.  IMAGING:  CT of head showed no acute infarction.  It did show low attenuation, changes in the deep white matter consistent with small vessel ischemia, focal area of encephalomalacia involving the right posterior frontal lobe and anterior temporal region, consistent with old MCA infarct.  No mass effect and no mass or lesion.  ASSESSMENT:  This is a 55 year old female with previous right middle cerebral artery infarct in March 2012.  She now presents with transient slurring of speech in the setting of urinary tract infection, bradycardia, and hypotension.  Symptoms most likely due to low cerebral fusion with heart rate in the 40s and hypotension.  RECOMMENDATIONS:  At this time; 1. Obtain MRI of brain to fully rule out any new infarct. 2. Continue the patient on aspirin and Plavix as she has been placed     on by Cardiology secondary to having cardiac stents placed.  Dr. Hoy Morn has seen the patient and agrees with the above-mentioned.  If MRI is negative, anticipating no further followup.     Felicie Morn, PA-C  I have examined the patient and agree with above clinical findings. ______________________________ Levie Heritage, MD    DS/MEDQ  D:  02/11/2011  T:  02/12/2011  Job:  [id number]  cc:   Levie Heritage, MD  Electronically Signed by Felicie Morn PA-C on 02/20/2011 12:38:25 PM Electronically Signed by Levie Heritage MD on 02/20/2011 01:10:37 PM

## 2011-02-20 NOTE — H&P (Signed)
[name], [name] NO.:  [id number]  MEDICAL RECORD NO.:  [id number]  LOCATION:                                 FACILITY:  PHYSICIAN:  Houston Siren, MD           DATE OF BIRTH:  1956-07-26  DATE OF ADMISSION: DATE OF DISCHARGE:                             HISTORY & PHYSICAL   PRIMARY CARE PHYSICIAN:  None.  ADVANCE DIRECTIVE:  Full code.  REASON FOR ADMISSION:  Bradycardia and TIA.  HISTORY OF PRESENT ILLNESS:  This is a 55 year old female with history of coronary artery disease with sternal infection, status post sternotomy, COPD, hypertension, prior CVA with residual left facial droop, hypothyroidism, sleep apnea, on Provigil, presents with 45 minutes of increased slurred speech.  She denied any focal weakness, lightheadedness, or any other neurological problems.  Her symptoms resolved spontaneously upon arrival to the emergency room.  She said that she has been on Lopressor 25 mg b.i.d. for many years along with Provigil, but she did not get her Provigil about a week ago.  This was due to insurance problems.  Evaluation in the emergency room included a head CT, which shows diffuse atrophy and small-vessel ischemic disease, and old right MCA infarct, but otherwise no acute changes.  Her electrolytes were rather unremarkable and her troponin was negative.  Her EKG survey shows sinus bradycardia with a rate of 40 and her blood pressure is 90/50.  Her blood pressure was low, and she is bradycardic, but in the emergency room even with these parameters, she is rather asymptomatic and was able to ambulate to the bathroom without any problem.  Because of that, no intervention was implemented. Hospitalist was asked to admit the patient for observation and to further evaluate her possible TIA.  PAST MEDICAL HISTORY:  Coronary artery disease, COPD, hypertension, CVA, hypothyroidism.  PAST SURGICAL HISTORY:  Heart bypass and cardiac stent.  ALLERGIES:   PENICILLIN, SULFA, CODEINE, AND AVELOX.  CURRENT MEDICATIONS: 1. Provigil which she had not taken for about 10 days. 2. Lopressor 25 mg b.i.d. 3. Plavix. 4. Crestor. 5. Synthroid 137 mcg per day. 6. Temazepam. 7. Tramadol. 8. Zanaflex.  REVIEW OF SYSTEMS:  Otherwise unremarkable.  SOCIAL HISTORY:  She is currently homeless.  She lives in a hotel.  Her son has been taking care of her and she is anticipating getting an apartment in a week or two.  She denied tobacco, alcohol, or drug use.  FAMILY HISTORY:  Noncontributory.  PHYSICAL EXAM:  GENERAL:  She is alert, oriented, and conversing, mentating well.  She has a very mild dysarthria. VITAL SIGNS:  Blood pressure 90/60, pulse of 40, respiratory rate of 16, temperature 97.9. HEENT:  Pupils equal and reactive.  Tongue is midline.  Uvula elevated with phonation.  She does have slight left facial droop. CARDIAC:  Revealed S1-S2, bradycardia.  No murmur, rub, or gallop. LUNGS:  Clear. ABDOMEN:  Slightly obese, nondistended, nontender. EXTREMITIES:  No edema.  No calf tenderness.  She does have strength that are strong bilaterally.  Babinski is negative. NEUROLOGICAL:  Unremarkable otherwise PSYCHIATRIC:  Unremarkable otherwise.  OBJECTIVE FINDINGS:  Troponin less than 0.3,  BUN of 238, potassium 3.9, creatinine 0.92, blood glucose of 98.  White count of 7500, hemoglobin of 12.0, platelet count 223,000.  INR 0.99.  Chest x-ray shows shallow inspiration, but otherwise negative.  IMPRESSION:  This is a 55 year old female with prior cerebrovascular accident, heart disease, chronic obstructive pulmonary disease, hypertension, hypothyroidism, and sleep apnea, on Provigil, presented to the emergency room with what appeared to be a transient ischemic attack. She is now found to be hypotensive and bradycardic, on beta-blocker, but with little symptoms.  I suspect that she had been able to tolerate the Lopressor because she was on  Provigil in the past, but since the discontinuation of the Provigil, she might unmask the bradycardia with her metoprolol.  In any event, we will admit her for TIA workup and to monitor her.  She should have her metoprolol discontinued perhaps indefinitely.  We will give her aspirin and Plavix and continue her medications.  Hypothyroid can certainly do this, and we will check TSH as well as continue her on her 137 mcg of Synthroid.  She is stable and will be admitted to telemetry unit under [facility].     Houston Siren, MD     PL/MEDQ  D:  02/11/2011  T:  02/11/2011  Job:  [id number]  Electronically Signed by Houston Siren  on 02/20/2011 03:05:10 AM

## 2011-02-21 ENCOUNTER — Ambulatory Visit: Payer: PRIVATE HEALTH INSURANCE | Admitting: Occupational Therapy

## 2011-02-21 ENCOUNTER — Ambulatory Visit: Payer: PRIVATE HEALTH INSURANCE | Admitting: Physical Therapy

## 2011-02-22 ENCOUNTER — Telehealth: Payer: Self-pay | Admitting: *Deleted

## 2011-02-22 DIAGNOSIS — R252 Cramp and spasm: Secondary | ICD-10-CM

## 2011-02-22 NOTE — Telephone Encounter (Signed)
Pt was in the hospital last week and today she woke up with leg cramps. They d/c her magnesium in the hospital and she started it back up on 6/8 when she got out of the hospital. She is wanting to know if she needs to stay on it, increase it or d/c it?

## 2011-02-23 NOTE — Discharge Summary (Signed)
[name], [name] NO.:  [id number]  MEDICAL RECORD NO.:  [id number]  LOCATION:  2508                         FACILITY:  [facility]  PHYSICIAN:  Erick Blinks, MD     DATE OF BIRTH:  March 13, 1956  DATE OF ADMISSION:  02/10/2011 DATE OF DISCHARGE:  02/14/2011                              DISCHARGE SUMMARY   PRIMARY CARE PHYSICIAN:  Neta Mends. Panosh, MD, of [place].  CARDIOLOGIST:  Arturo Morton. Riley Kill, MD, [facility]  PULMONOLOGIST:  Rennis Chris. Young, MD, FCCP, FACP  DISCHARGE DIAGNOSES: 1. Transient dysarthria secondary to decreased cerebral perfusion due     to bradycardia and hypotension, resolved. 2. Sinus bradycardia, secondary to beta-blocker and also uncontrolled     sleep apnea. 3. Obstructive sleep apnea. 4. Obesity. 5. Recent right middle cerebral artery infarct with left hemiparesis. 6. Coronary artery disease. 7. 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 8. Daytime somnolence on Provigil. 9. Urinary tract infection, present on admission, treated. 10.Hyperlipidemia. 11.Hypothyroidism.  DISCHARGE MEDICATIONS: 1. Aspirin 81 mg daily p.o. 2. Provigil 200 mg p.o. daily. 3. Plavix 75 mg p.o. daily. 4. Amlodipine 5 mg p.o. daily. 5. Crestor 20 mg p.o. daily. 6. Levothyroxine 137 mcg 1 tablet p.o. daily. 7. Potassium chloride 20 mEq p.o. daily. 8. Temazepam 7.5 mg 1 capsule p.o. at bedtime p.r.n. 9. Tramadol 50 mg 1 tablet p.o. t.i.d. p.r.n. 10.Prilosec 10 mg over the counter 1 capsule p.o. daily. 11.Magnesium chloride 2 tablets in the morning, 4 tablets in the     evening p.o.  MEDICATIONS STOPPED IN THE HOSPITAL:  Metoprolol 25 mg p.o. b.i.d.  ADMISSION HISTORY:  This is a 55 year old female with history of sleep apnea, coronary artery disease, as well as recent stroke, who presents to the emergency room with bradycardia and transient dysarthria.  The patient was found to be bradycardic with heart rate of 40 as  well as a blood pressure of 90/50.  She has been not using her CPAP for last 2 weeks due to problems with her mask.  She has also been on a beta- blocker.  She was subsequently admitted for further workup.  For details, please refer to the history and physical dictated by Dr. Nedra Hai.  HOSPITAL COURSE: 1. Transient dysarthria.  The patient was seen in consultation by Dr.     Hoy Morn from the [facility].  It was felt that her     symptoms were likely secondary to cerebral hypoperfusion in the     setting of bradycardia and hypotension.  She had an MRI of the     brain done which did not show any acute ischemia.  Her beta-blocker     was held.  Her blood pressure has since improved as has her heart     rate and her symptoms have since resolved.  She should be continued     on aspirin and Plavix as she was previously doing and she does not     need any further workup for her symptoms.  She is to continue     physical therapy as she was doing prior to admission for  her recent     stroke. 2. Bradycardia.  This was felt to be secondary to her beta-blocker     along with her uncontrolled sleep apnea.  Her beta-blocker was     held.  Her blood pressure has since improved as has her heart rate.     She is working on getting a CPAP mask through her pulmonologist,     Dr. Roxy Cedar office.  She was started on amlodipine to help control     her blood pressure.  She may follow up with Dr. Riley Kill for further     treatment for this. 3. The remainder the patient's medical problems have remained stable.  CONSULTATIONS: 1. Neurology, Dr. Hoy Morn. 2. Cardiology, Dr. Riley Kill.  PROCEDURES:  None.  DIAGNOSTIC IMAGING: 1. Chest x-ray on admission shows shallow inspiration.  No definite     evidence of active pulmonary disease. 2. CT head on admission shows diffuse atrophy and small vessel     ischemic changes with right middle cerebral artery infarct, no     significant change since  previous study.  No evidence of acute     intracranial hemorrhage, mass, lesion, or acute infarct. 3. MRI of the brain on February 11, 2011, shows no evidence of acute     ischemia, sequela of remote ischemia involving the right putamen     extending into the right peri-insular cortex, moderate inflammatory     mucosal thickening of the paranasal sinuses with stable appearance     of maxillary sinus, mucus retention cyst, retained secretions     versus inflammatory reaction of the mastoid air cells, also grossly     stable. 4. Carotid Dopplers were recently done on March 2, 202, and therefore     were not repeated.  These studies had showed no significant     extracranial carotid artery stenosis.  Vertebral arteries were     patent with antegrade flow. 5. A 2-D echocardiogram done on February 11, 2011, shows wall thickness     with increasing in a pattern of mild LVH.  Systolic functional     normal.  Ejection fraction of 60% to 65%.  Left atrium was mildly     dilated.  DISCHARGE INSTRUCTIONS:  The patient is to follow up with her primary care physician in next 1-2 weeks.  She will also need to follow up with Dr. Riley Kill in the next few weeks.  She will follow up with Dr. Maple Hudson to obtain her CPAP mask.  She should continue on a heart-healthy diet, conduct her activity as tolerated.  She will need to continue with outpatient physical therapy as she was doing prior to admission.  She is stable for discharge.  CONDITION AT TIME OF DISCHARGE:  Improved.     Erick Blinks, MD     JM/MEDQ  D:  02/14/2011  T:  02/15/2011  Job:  [id number]  cc:   Joni Fears D. Maple Hudson, MD, FCCP, FACP Arturo Morton. Riley Kill, MD, [facility] Neta Mends. Fabian Sharp, MD  Electronically Signed by Erick Blinks  on 02/23/2011 03:30:34 PM

## 2011-02-24 NOTE — Telephone Encounter (Signed)
This needs to be checked .Marland KitchenMarland KitchenI dont see any evidence of  addressing this in the hospital summaries .  check BMP and MAGNESIUM LEVEL    Plan  Post hospital visit depending on  her labs .    Please document how many pills she is taking per day so I can interpret the tests.  She may not remain on daily  doses chronically . It depends on her other meds.    Continue her neuro rehab  In the meantime.

## 2011-02-25 NOTE — Telephone Encounter (Signed)
Pt aware and will call back to schedule this appt.

## 2011-02-26 ENCOUNTER — Ambulatory Visit: Payer: PRIVATE HEALTH INSURANCE | Admitting: Occupational Therapy

## 2011-02-26 ENCOUNTER — Ambulatory Visit: Payer: PRIVATE HEALTH INSURANCE | Admitting: Physical Therapy

## 2011-02-26 ENCOUNTER — Other Ambulatory Visit: Payer: Self-pay | Admitting: *Deleted

## 2011-02-26 ENCOUNTER — Other Ambulatory Visit (INDEPENDENT_AMBULATORY_CARE_PROVIDER_SITE_OTHER): Payer: PRIVATE HEALTH INSURANCE

## 2011-02-26 DIAGNOSIS — R252 Cramp and spasm: Secondary | ICD-10-CM

## 2011-02-26 LAB — BASIC METABOLIC PANEL
BUN: 21 mg/dL (ref 6–23)
CO2: 28 mEq/L (ref 19–32)
Calcium: 9.6 mg/dL (ref 8.4–10.5)
Chloride: 106 mEq/L (ref 96–112)
Creatinine, Ser: 1 mg/dL (ref 0.4–1.2)
Glucose, Bld: 112 mg/dL — ABNORMAL HIGH (ref 70–99)

## 2011-02-26 MED ORDER — LEVOTHYROXINE SODIUM 137 MCG PO TABS: 137.0000 ug | ORAL_TABLET | Freq: Every day | ORAL | Status: DC

## 2011-02-28 ENCOUNTER — Encounter: Payer: PRIVATE HEALTH INSURANCE | Admitting: Occupational Therapy

## 2011-02-28 ENCOUNTER — Ambulatory Visit: Payer: PRIVATE HEALTH INSURANCE | Admitting: Physical Therapy

## 2011-03-04 ENCOUNTER — Telehealth: Payer: Self-pay | Admitting: *Deleted

## 2011-03-04 NOTE — Telephone Encounter (Signed)
Refills on tizanidine 2mg  and K-dur 

## 2011-03-05 ENCOUNTER — Ambulatory Visit: Payer: PRIVATE HEALTH INSURANCE | Admitting: Physical Therapy

## 2011-03-05 ENCOUNTER — Ambulatory Visit: Payer: PRIVATE HEALTH INSURANCE | Admitting: Physical Medicine & Rehabilitation

## 2011-03-05 ENCOUNTER — Encounter: Payer: PRIVATE HEALTH INSURANCE | Attending: Physical Medicine & Rehabilitation

## 2011-03-05 DIAGNOSIS — G8110 Spastic hemiplegia affecting unspecified side: Secondary | ICD-10-CM

## 2011-03-05 DIAGNOSIS — G4733 Obstructive sleep apnea (adult) (pediatric): Secondary | ICD-10-CM | POA: Insufficient documentation

## 2011-03-05 DIAGNOSIS — Z79899 Other long term (current) drug therapy: Secondary | ICD-10-CM | POA: Insufficient documentation

## 2011-03-05 DIAGNOSIS — I2510 Atherosclerotic heart disease of native coronary artery without angina pectoris: Secondary | ICD-10-CM | POA: Insufficient documentation

## 2011-03-05 DIAGNOSIS — I6340 Cerebral infarction due to embolism of unspecified cerebral artery: Secondary | ICD-10-CM

## 2011-03-05 DIAGNOSIS — I69959 Hemiplegia and hemiparesis following unspecified cerebrovascular disease affecting unspecified side: Secondary | ICD-10-CM | POA: Insufficient documentation

## 2011-03-05 DIAGNOSIS — M75100 Unspecified rotator cuff tear or rupture of unspecified shoulder, not specified as traumatic: Secondary | ICD-10-CM

## 2011-03-06 ENCOUNTER — Other Ambulatory Visit: Payer: Self-pay | Admitting: Internal Medicine

## 2011-03-06 ENCOUNTER — Emergency Department (HOSPITAL_COMMUNITY)
Admission: EM | Admit: 2011-03-06 | Discharge: 2011-03-07 | Disposition: A | Discharge status: Home / Self Care | Payer: PRIVATE HEALTH INSURANCE | Attending: Emergency Medicine | Admitting: Emergency Medicine

## 2011-03-06 ENCOUNTER — Telehealth: Payer: Self-pay | Admitting: *Deleted

## 2011-03-06 DIAGNOSIS — Z79899 Other long term (current) drug therapy: Secondary | ICD-10-CM | POA: Insufficient documentation

## 2011-03-06 DIAGNOSIS — R404 Transient alteration of awareness: Secondary | ICD-10-CM | POA: Insufficient documentation

## 2011-03-06 DIAGNOSIS — I10 Essential (primary) hypertension: Secondary | ICD-10-CM | POA: Insufficient documentation

## 2011-03-06 DIAGNOSIS — I69992 Facial weakness following unspecified cerebrovascular disease: Secondary | ICD-10-CM | POA: Insufficient documentation

## 2011-03-06 DIAGNOSIS — E876 Hypokalemia: Secondary | ICD-10-CM

## 2011-03-06 DIAGNOSIS — I2510 Atherosclerotic heart disease of native coronary artery without angina pectoris: Secondary | ICD-10-CM | POA: Insufficient documentation

## 2011-03-06 DIAGNOSIS — J4489 Other specified chronic obstructive pulmonary disease: Secondary | ICD-10-CM | POA: Insufficient documentation

## 2011-03-06 DIAGNOSIS — H538 Other visual disturbances: Secondary | ICD-10-CM | POA: Insufficient documentation

## 2011-03-06 DIAGNOSIS — G459 Transient cerebral ischemic attack, unspecified: Secondary | ICD-10-CM | POA: Insufficient documentation

## 2011-03-06 DIAGNOSIS — E039 Hypothyroidism, unspecified: Secondary | ICD-10-CM | POA: Insufficient documentation

## 2011-03-06 DIAGNOSIS — R4182 Altered mental status, unspecified: Secondary | ICD-10-CM | POA: Insufficient documentation

## 2011-03-06 DIAGNOSIS — Z951 Presence of aortocoronary bypass graft: Secondary | ICD-10-CM | POA: Insufficient documentation

## 2011-03-06 DIAGNOSIS — F29 Unspecified psychosis not due to a substance or known physiological condition: Secondary | ICD-10-CM | POA: Insufficient documentation

## 2011-03-06 DIAGNOSIS — J449 Chronic obstructive pulmonary disease, unspecified: Secondary | ICD-10-CM | POA: Insufficient documentation

## 2011-03-06 DIAGNOSIS — I69959 Hemiplegia and hemiparesis following unspecified cerebrovascular disease affecting unspecified side: Secondary | ICD-10-CM | POA: Insufficient documentation

## 2011-03-06 DIAGNOSIS — Z7982 Long term (current) use of aspirin: Secondary | ICD-10-CM | POA: Insufficient documentation

## 2011-03-06 MED ORDER — POTASSIUM CHLORIDE CRYS ER 20 MEQ PO TBCR: 20.0000 meq | EXTENDED_RELEASE_TABLET | Freq: Every day | ORAL | Status: DC

## 2011-03-06 NOTE — Telephone Encounter (Signed)
Message copied by Romualdo Bolk on Wed Mar 06, 2011  8:51 AM ------      Message from: [facility], [place]      Created: Tue Mar 05, 2011  5:07 PM       Advise patient continue same  Potassium dose and take   Magnesium 4 pills per day        Her level is  1.7 low normal.               Repeat Potassium and Magnesium  level in  1 month.

## 2011-03-06 NOTE — Assessment & Plan Note (Signed)
CHIEF COMPLAINT:  Left shoulder pain.  HISTORY:  [name] is a 55 year old female, right MCP distribution infarct left hemiparesis.  She went through inpatient rehab from November 13, 2010, through December 04, 2010.  Initially, discharged through home health now, then went through outpatient therapy.  She had to quit outpatient therapy because she became hypotensive, she was reportedly admitted to [facility] as a result.  Her beta blocker was stopped. She was felt to have problems with sleep apnea.  She has had chronic problems with this, had some problem with mask-fitting, has followed up in the past with Dr. Jetty Duhamel, but she really has not followed up with him for long time.  She is currently on short-term disability.  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  She has had no falls.  She lives alone.  She is divorced.  She sees Dr. Leonides Cave, for Psychology, used to see a psychiatrist, but does not see one currently, Dr. Fabian Sharp, her primary physician has been handling her medications.  I did look over her admission February 10, 2011, to February 14, 2011, for transient dysarthria due to cerebral perfusion, reduction due to bradycardia, hypertension which resolved.  She was seen by Neurology, not felt to have a new stroke.  Continue on aspirin and Plavix.  REVIEW OF SYSTEMS:  Positive for numbness, tremor, spasms, confusion, depression, also has difficulty with taste and smell.  In regards to her left shoulder, no recent trauma.  She has had good results with previous injection, left subacromial bursa.  PHYSICAL EXAMINATION:  VITAL SIGNS:  Blood pressure 143/88, pulse 84, respirations 18, O2 sat 98% on room air. MUSCULOSKELETAL:  She has some mild pain in subacromial areas, no pain over the [facility] joint.  She has positive impingement sign.  She has 3-/5 strength in deltoid biceps triceps grip, 4- in the hip flexion, knee extension, ankle dorsiflexor. PSYCHIATRIC:   Mood and affect are flat.  IMPRESSION: 1. 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 2. Left post-stroke shoulder pain. 3. Left subacromial bursitis.  PLAN: 1. Repeat injection in left shoulder. 2. Shoulder ultrasound. Discussed with patient, agrees with plan.  She will continue outpatient therapy. For her post-stroke depression and cough, followup with Dr. Leonides Cave, she may need a dosage adjustment for her venlafaxine, I would defer to Dr. Fabian Sharp on this. 1. I will resume her tramadol 50 b.i.d. as well as her Zanaflex 2 mg     t.i.d.     Erick Colace, M.D. Electronically Signed    AEK/MedQ D:  03/05/2011 13:53:57  T:  03/06/2011 01:07:45  Job #:  [id number]  cc:   Arturo Morton. Riley Kill, MD, [facility] [address]  [address] [place] [address]

## 2011-03-06 NOTE — Telephone Encounter (Signed)
Tried to call pt but this number doesn't accept incoming calls. I mailed pt a letter to call the office and about the results.

## 2011-03-07 ENCOUNTER — Ambulatory Visit: Payer: PRIVATE HEALTH INSURANCE | Admitting: Physical Therapy

## 2011-03-07 ENCOUNTER — Emergency Department (HOSPITAL_COMMUNITY): Payer: PRIVATE HEALTH INSURANCE

## 2011-03-07 LAB — BASIC METABOLIC PANEL
Calcium: 9 mg/dL (ref 8.4–10.5)
Chloride: 104 mEq/L (ref 96–112)
Creatinine, Ser: 0.87 mg/dL (ref 0.50–1.10)
GFR calc Af Amer: >60 mL/min (ref >60)
Sodium: 135 mEq/L (ref 135–145)

## 2011-03-07 LAB — DIFFERENTIAL
Eosinophils Absolute: 0.1 10*3/uL (ref 0.0–0.7)
Eosinophils Relative: 2 % (ref 0–5)
Lymphocytes Relative: 20 % (ref 12–46)
Lymphs Abs: 1.2 10*3/uL (ref 0.7–4.0)
Monocytes Absolute: 0.6 10*3/uL (ref 0.1–1.0)
Monocytes Relative: 9 % (ref 3–12)

## 2011-03-07 LAB — TROPONIN I: Troponin I: <0.3 ng/mL (ref <0.30)

## 2011-03-07 LAB — CK TOTAL AND CKMB (NOT AT ARMC): Relative Index: INVALID (ref 0.0–2.5)

## 2011-03-07 LAB — APTT: aPTT: 33 seconds (ref 24–37)

## 2011-03-07 LAB — CBC
HCT: 33.2 % — ABNORMAL LOW (ref 36.0–46.0)
MCH: 29.3 pg (ref 26.0–34.0)
MCV: 82.4 fL (ref 78.0–100.0)
Platelets: 188 10*3/uL (ref 150–400)
RDW: 12.2 % (ref 11.5–15.5)
WBC: 6.3 10*3/uL (ref 4.0–10.5)

## 2011-03-07 LAB — GLUCOSE, CAPILLARY: Glucose-Capillary: 109 mg/dL — ABNORMAL HIGH (ref 70–99)

## 2011-03-07 MED ORDER — GADOBENATE DIMEGLUMINE 529 MG/ML IV SOLN: 19.0000 mL | Freq: Once | INTRAVENOUS | Status: DC

## 2011-03-11 ENCOUNTER — Ambulatory Visit: Payer: PRIVATE HEALTH INSURANCE | Attending: Internal Medicine | Admitting: Occupational Therapy

## 2011-03-11 ENCOUNTER — Ambulatory Visit: Payer: PRIVATE HEALTH INSURANCE | Admitting: Physical Therapy

## 2011-03-11 DIAGNOSIS — R269 Unspecified abnormalities of gait and mobility: Secondary | ICD-10-CM | POA: Insufficient documentation

## 2011-03-11 DIAGNOSIS — R279 Unspecified lack of coordination: Secondary | ICD-10-CM | POA: Insufficient documentation

## 2011-03-11 DIAGNOSIS — M6281 Muscle weakness (generalized): Secondary | ICD-10-CM | POA: Insufficient documentation

## 2011-03-11 DIAGNOSIS — Z5189 Encounter for other specified aftercare: Secondary | ICD-10-CM | POA: Insufficient documentation

## 2011-03-11 DIAGNOSIS — I69998 Other sequelae following unspecified cerebrovascular disease: Secondary | ICD-10-CM | POA: Insufficient documentation

## 2011-03-11 DIAGNOSIS — I69919 Unspecified symptoms and signs involving cognitive functions following unspecified cerebrovascular disease: Secondary | ICD-10-CM | POA: Insufficient documentation

## 2011-03-12 NOTE — Telephone Encounter (Signed)
[name], have you received any approval or denial on this pt? Please advise, thanks!

## 2011-03-12 NOTE — Telephone Encounter (Signed)
Paperwork was in my in-box on my wall-I have RE-FAXED them again!!! Will wait for decision and re-check on Thursday from insurance company.

## 2011-03-15 ENCOUNTER — Ambulatory Visit: Payer: PRIVATE HEALTH INSURANCE | Admitting: Physical Therapy

## 2011-03-15 ENCOUNTER — Ambulatory Visit: Payer: PRIVATE HEALTH INSURANCE | Admitting: Occupational Therapy

## 2011-03-15 NOTE — Telephone Encounter (Signed)
Patient and pharmacy aware medication approved from now to 02-13-2012.

## 2011-03-18 ENCOUNTER — Encounter: Payer: Self-pay | Admitting: Internal Medicine

## 2011-03-18 ENCOUNTER — Telehealth: Payer: Self-pay | Admitting: Cardiology

## 2011-03-18 ENCOUNTER — Ambulatory Visit (INDEPENDENT_AMBULATORY_CARE_PROVIDER_SITE_OTHER): Payer: PRIVATE HEALTH INSURANCE | Admitting: Internal Medicine

## 2011-03-18 DIAGNOSIS — R431 Parosmia: Secondary | ICD-10-CM

## 2011-03-18 DIAGNOSIS — R439 Unspecified disturbances of smell and taste: Secondary | ICD-10-CM

## 2011-03-18 DIAGNOSIS — I639 Cerebral infarction, unspecified: Secondary | ICD-10-CM

## 2011-03-18 DIAGNOSIS — M62838 Other muscle spasm: Secondary | ICD-10-CM

## 2011-03-18 DIAGNOSIS — I10 Essential (primary) hypertension: Secondary | ICD-10-CM

## 2011-03-18 DIAGNOSIS — F4321 Adjustment disorder with depressed mood: Secondary | ICD-10-CM

## 2011-03-18 DIAGNOSIS — I6350 Cerebral infarction due to unspecified occlusion or stenosis of unspecified cerebral artery: Secondary | ICD-10-CM

## 2011-03-18 NOTE — Progress Notes (Signed)
  Subjective:    Patient ID: [name], female    DOB: September 12, 1955, 55 y.o.   MRN: [id number]  HPI Come  In today after fu of hospital evaluation for episode of slurred speech without motor effects .  June 27. Had neg eval and felt not to be new stroke although TIA possible.   Mg  4 x per day .   Not  Addressed while in hospital  needs repeat  Had just started taking Tramadol and xanaflex.       3 x per day. When this event happened  Left shoulder still problematic    Had steroid shot with some help Now has an apt  At at [facility]  and ground floor.   And son helps.  Review of Systems Difficulty swallowing so puts her pills into pudding and takes them twice a day does have her son help check the pill count. No falling. Still gets some leg cramps nocturnally on the left lower extremity. Food smells bad and has affected her appetit Past history family history social history reviewed in the electronic medical record.     Objective:   Physical Exam Alert and good eye contact  Independent walking  Left hemiparesis No acute distress somewhat flat affect Ambulates with a cane unassisted otherwise. No foot drop. Left upper extremity weakness and left facial droopiness. Speech however appears to be normal. Chest:  Clear to A&P without wheezes rales or rhonchi CV:  S1-S2 no gallops or murmurs peripheral perfusion is normal BP os 106/76 right  No clubbing cyanosis or edema     See hosp records.  Assessment & Plan:  Episodes   of slurred speech emergency room thought it could have been something else with negative MRI and chemistries. Possible side effects of medications that she had taken but unsure.  Depression Has been problematic difficulty concentrating unsure how much is from the stroke and how much is reaction in life events Agree with seeing a psychiatrist  for optimal medicine management.. Name is given.    Smell   alteration has been problematic for her . Unsure cause to try to have her  neurology appointment moved up. Hypertension blood pressure running on the low side but above 100.  Hypomagnesemia   is much better still in normal taking 4 pills a day we'll plan followup with BMP and magnesium in 2-3 weeks. We will then plan followup depending on results. ? from diuretic use lasix.    [name]

## 2011-03-18 NOTE — Telephone Encounter (Signed)
Pt needs refill on crestor 10mg  qd and norvasc 5mg  qd called into [facility] on [address] ([address]

## 2011-03-18 NOTE — Patient Instructions (Signed)
Continue magnesium  Check mag and bmp in 2-3 weeks. Then plan follow up . Agree with seeing psychiatrist for   Depression medication Will  Contact neurology about seeing you earlier than end of august.

## 2011-03-19 ENCOUNTER — Ambulatory Visit: Payer: PRIVATE HEALTH INSURANCE | Admitting: Physical Therapy

## 2011-03-19 ENCOUNTER — Ambulatory Visit: Payer: PRIVATE HEALTH INSURANCE | Admitting: Occupational Therapy

## 2011-03-19 MED ORDER — AMLODIPINE BESYLATE 5 MG PO TABS: 5.0000 mg | ORAL_TABLET | Freq: Every day | ORAL | Status: DC

## 2011-03-19 MED ORDER — ROSUVASTATIN CALCIUM 5 MG PO TABS: 5.0000 mg | ORAL_TABLET | Freq: Every day | ORAL | Status: DC

## 2011-03-21 ENCOUNTER — Ambulatory Visit: Payer: PRIVATE HEALTH INSURANCE | Admitting: Physical Therapy

## 2011-03-21 ENCOUNTER — Ambulatory Visit: Payer: PRIVATE HEALTH INSURANCE | Admitting: Occupational Therapy

## 2011-03-21 DIAGNOSIS — F331 Major depressive disorder, recurrent, moderate: Secondary | ICD-10-CM

## 2011-03-24 ENCOUNTER — Encounter: Payer: Self-pay | Admitting: Internal Medicine

## 2011-03-25 ENCOUNTER — Other Ambulatory Visit: Payer: Self-pay | Admitting: Internal Medicine

## 2011-03-25 ENCOUNTER — Ambulatory Visit: Payer: PRIVATE HEALTH INSURANCE | Admitting: Physical Therapy

## 2011-03-25 ENCOUNTER — Encounter: Payer: PRIVATE HEALTH INSURANCE | Admitting: Occupational Therapy

## 2011-03-26 ENCOUNTER — Telehealth: Payer: Self-pay | Admitting: *Deleted

## 2011-03-26 MED ORDER — POTASSIUM CHLORIDE CRYS ER 20 MEQ PO TBCR: EXTENDED_RELEASE_TABLET | ORAL | Status: DC

## 2011-03-26 NOTE — Telephone Encounter (Signed)
Go ahead and give her the 2 tabs daily per Dr. Fabian Sharp.

## 2011-03-26 NOTE — Telephone Encounter (Signed)
Received a fax from the pharmacy stating that pt is suppose to be taking 2 tabs daily for her potassium.  I called pt and pt states that she thought she was suppose to be taking 2 potassium and 4 magn. Please advise on this.

## 2011-03-26 NOTE — Telephone Encounter (Signed)
Rx sent to pharmacy   

## 2011-03-27 ENCOUNTER — Ambulatory Visit: Payer: PRIVATE HEALTH INSURANCE | Admitting: Occupational Therapy

## 2011-03-27 ENCOUNTER — Ambulatory Visit: Payer: PRIVATE HEALTH INSURANCE | Admitting: Physical Therapy

## 2011-03-28 ENCOUNTER — Encounter: Payer: Self-pay | Admitting: Cardiology

## 2011-03-28 ENCOUNTER — Ambulatory Visit (INDEPENDENT_AMBULATORY_CARE_PROVIDER_SITE_OTHER): Payer: PRIVATE HEALTH INSURANCE | Admitting: Cardiology

## 2011-03-28 DIAGNOSIS — I6350 Cerebral infarction due to unspecified occlusion or stenosis of unspecified cerebral artery: Secondary | ICD-10-CM

## 2011-03-28 DIAGNOSIS — I63511 Cerebral infarction due to unspecified occlusion or stenosis of right middle cerebral artery: Secondary | ICD-10-CM

## 2011-03-28 DIAGNOSIS — I2510 Atherosclerotic heart disease of native coronary artery without angina pectoris: Secondary | ICD-10-CM

## 2011-03-28 DIAGNOSIS — G4733 Obstructive sleep apnea (adult) (pediatric): Secondary | ICD-10-CM

## 2011-03-28 DIAGNOSIS — I5032 Chronic diastolic (congestive) heart failure: Secondary | ICD-10-CM

## 2011-03-28 DIAGNOSIS — I509 Heart failure, unspecified: Secondary | ICD-10-CM

## 2011-03-28 DIAGNOSIS — D649 Anemia, unspecified: Secondary | ICD-10-CM

## 2011-03-28 DIAGNOSIS — E785 Hyperlipidemia, unspecified: Secondary | ICD-10-CM

## 2011-03-28 MED ORDER — POTASSIUM CHLORIDE 20 MEQ/15ML (10%) PO LIQD: 20.0000 meq | Freq: Every day | ORAL | Status: DC

## 2011-03-28 MED ORDER — PANTOPRAZOLE SODIUM 40 MG PO TBEC: 40.0000 mg | DELAYED_RELEASE_TABLET | Freq: Every day | ORAL | Status: DC

## 2011-03-28 NOTE — Patient Instructions (Signed)
Your physician recommends that you schedule a follow-up appointment in: 2 months  Your physician has recommended you make the following change in your medication: STOP PRILOSEC and START PROTONIX 40 mg daily. START POTASSIUM 20 meq/ 15 ml daily.

## 2011-04-01 ENCOUNTER — Telehealth: Payer: Self-pay | Admitting: *Deleted

## 2011-04-01 MED ORDER — FLUOCINONIDE 0.05 % EX OINT: TOPICAL_OINTMENT | Freq: Two times a day (BID) | CUTANEOUS | Status: DC

## 2011-04-01 NOTE — Telephone Encounter (Signed)
rx sent to pharmacy

## 2011-04-01 NOTE — Assessment & Plan Note (Addendum)
Her shortness of breath has improved a lot since she was initiated on furosemide after right heart cath.  Will continue, and monitor. Getting some magnesium and potassium supplementation.

## 2011-04-01 NOTE — Assessment & Plan Note (Signed)
Given all the things she has faced, this is important.  She is dedicated to getting treated, and it made a substantial difference in her fatigue in the past.  She is working with Dr. Roxy Cedar office on other masks at this point.

## 2011-04-01 NOTE — Progress Notes (Signed)
HPI:  [name] is in for follow up.  Her shortness of breath is improved.  She has slowly improved, and is working with physical therapy.  She still has some limitations, specifically with the left hand.  She is living independently with her son's help, and does remains somewhat estranged from her sister.  She denies chest pain. Her ambulation is still not great, but improving I think.  I am please overall with her progress.   The one problematic thing is that she has been having trouble with her CPAP.  The mask has not fit well, and she has not been able to use without it cutting her face.  She is working with Dr. Roxy Cedar office on this.   Her event monitor demonstrated some short bursts of SVT, but not evident evidence of atrial fib.  She is on DAPT, which seems appropriate and was done by the Stroke team.  Currently stable on her regimen, and making some progression.  I congratulated her on her toughness through all of this.    Current Outpatient Prescriptions  Medication Sig Dispense Refill  . amLODipine (NORVASC) 5 MG tablet Take 1 tablet (5 mg total) by mouth daily.  30 tablet  9  . Armodafinil (NUVIGIL) 150 MG tablet Take 1 tablet (150 mg total) by mouth daily.  30 tablet  2  . aspirin 81 MG tablet Take 81 mg by mouth daily.        . clopidogrel (PLAVIX) 75 MG tablet Take 1 tablet (75 mg total) by mouth daily.  30 tablet  6  . furosemide (LASIX) 20 MG tablet Take 1 tablet (20 mg total) by mouth daily.  30 tablet  11  . levothyroxine (SYNTHROID, LEVOTHROID) 137 MCG tablet Take 137 mcg by mouth daily.  30 tablet  2  . magnesium chloride (SLOW-MAG) 64 MG TBEC Take 2 tablets by mouth 2 (two) times daily. Take 4 tabs per day       . potassium chloride SA (K-DUR,KLOR-CON) 20 MEQ tablet 20 mEq daily.        . rosuvastatin (CRESTOR) 5 MG tablet Take 1 tablet (5 mg total) by mouth daily.  30 tablet  9  . temazepam (RESTORIL) 7.5 MG capsule Take 1 capsule (7.5 mg total) by mouth at bedtime as needed.  30  capsule  2  . tizanidine (ZANAFLEX) 2 MG capsule Take 2 mg by mouth 3 (three) times daily.        . traMADol (ULTRAM) 50 MG tablet Take 50 mg by mouth as needed.        . venlafaxine (EFFEXOR-XR) 75 MG 24 hr capsule TAKE ONE CAPSULE BY MOUTH ONE TIME DAILY  30 capsule  4  . fluocinonide (LIDEX) 0.05 % ointment Apply topically 2 (two) times daily.  30 g  0  . pantoprazole (PROTONIX) 40 MG tablet Take 1 tablet (40 mg total) by mouth daily.  30 tablet  12  . potassium chloride 20 MEQ/15ML (10%) solution Take 15 mLs (20 mEq total) by mouth daily.  450 mL  12    Allergies  Allergen Reactions  . Amoxicillin     REACTION: unspecified  . Avelox (Moxifloxacin Hcl In Nacl)   . Hydrocodone     REACTION: itching  . Penicillins     REACTION: hives  . Sulfonamide Derivatives     REACTION: ?    Past Medical History  Diagnosis Date  . CAD (coronary artery disease)     cath 09/2010: LAD stent  patent, S-Int/dCFX ok, S-PDA ok, L-LAD atretic  . Hypertension   . Hx of CABG   . Diastolic dysfunction     TEE 11/2010: EF 60-65%, BAE, trivial atrial septal shunt;  right heart cath in 10/2010 with elevated R and L heart pressures and diuretic started  . HLD (hyperlipidemia)   . Hypothyroidism   . Sleep apnea   . COPD (chronic obstructive pulmonary disease)   . Pulmonary nodules     repeat CT due in 11/2011  . Acute right MCA stroke 11/07/10  . Hx MRSA infection     Chest wall syndrome post CABG    Past Surgical History  Procedure Date  . Coronary artery bypass graft   . Debriment for infection in chest   . Chest wall reconstruction   . Bilateral knee surgery   . Bladder surgery   . Left mastoidectomy   . Hernia repair   . Cath 2012     Family History  Problem Relation Age of Onset  . COPD Mother   . Heart disease Mother   . Arthritis Mother     Rheumatoid and PMR  . Heart attack Father   . Depression Father   . Hypertension Father   . Osteoporosis Mother     Mom fractured hip  .  Diabetes type II Mother     History   Social History  . Marital Status: Divorced    Spouse Name: N/A    Number of Children: N/A  . Years of Education: N/A   Occupational History  . Not on file.   Social History Main Topics  . Smoking status: Former Smoker    Quit date: 06/14/2006  . Smokeless tobacco: Not on file  . Alcohol Use: No  . Drug Use: No  . Sexually Active: Not on file   Other Topics Concern  . Not on file   Social History Narrative   DivorcedAfter stroke was living with sister and mother after sister asked her to leave.Difficulties over control.Ex-smokerCurrently living in  An appt. 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  All other systems reviewed and negative.  PHYSICAL EXAM:  BP 138/80  Pulse 60  Resp 18  Ht 5\' 4"  (1.626 m)  Wt 186 lb 12.8 oz (84.732 kg)  BMI 32.06 kg/m2  General: Well developed, well nourished, in no acute distress. Head:  Normocephalic and atraumatic.  Mild facial droop on the left.   Neck: no JVD Lungs: Clear to auscultation and percussion. Heart: Normal S1 and S2.  No murmur, rubs or gallops.  Abdomen:  Normal bowel sounds; soft; non tender; no organomegaly Extremities: No clubbing or cyanosis. No edema. Neurologic: Alert and oriented x 3.  L facial droop.  Some assistance to left hand from the right, with perhaps some clumsiness.  We reviewed typing issues. df  EKG:  ASSESSMENT AND PLAN:

## 2011-04-01 NOTE — Assessment & Plan Note (Signed)
LDL is 75 on rosuva at low dose.  Will reassess at next office visit, and then discuss adjustments if they are necessary.

## 2011-04-01 NOTE — Assessment & Plan Note (Signed)
She seems pretty stable.  On DAPT at this point.  Currently in rehab and doing quite well with it.  Pleased with her progress.  Will continue medical therapy at this point, and physical therapy.  Hopefully she will continue to do well.

## 2011-04-01 NOTE — Assessment & Plan Note (Signed)
Remains stable.  No angina at present.

## 2011-04-03 ENCOUNTER — Telehealth: Payer: Self-pay | Admitting: *Deleted

## 2011-04-03 ENCOUNTER — Observation Stay (HOSPITAL_COMMUNITY)
Admission: EM | Admit: 2011-04-03 | Discharge: 2011-04-03 | Disposition: A | Discharge status: Home / Self Care | Payer: PRIVATE HEALTH INSURANCE | Source: Ambulatory Visit | Attending: Emergency Medicine | Admitting: Emergency Medicine

## 2011-04-03 ENCOUNTER — Emergency Department (HOSPITAL_COMMUNITY): Payer: PRIVATE HEALTH INSURANCE

## 2011-04-03 ENCOUNTER — Ambulatory Visit: Payer: PRIVATE HEALTH INSURANCE | Admitting: Physical Therapy

## 2011-04-03 ENCOUNTER — Encounter: Payer: PRIVATE HEALTH INSURANCE | Admitting: Occupational Therapy

## 2011-04-03 DIAGNOSIS — I2510 Atherosclerotic heart disease of native coronary artery without angina pectoris: Secondary | ICD-10-CM | POA: Insufficient documentation

## 2011-04-03 DIAGNOSIS — I10 Essential (primary) hypertension: Secondary | ICD-10-CM | POA: Insufficient documentation

## 2011-04-03 DIAGNOSIS — I69998 Other sequelae following unspecified cerebrovascular disease: Secondary | ICD-10-CM | POA: Insufficient documentation

## 2011-04-03 DIAGNOSIS — R112 Nausea with vomiting, unspecified: Secondary | ICD-10-CM | POA: Insufficient documentation

## 2011-04-03 DIAGNOSIS — R5381 Other malaise: Secondary | ICD-10-CM | POA: Insufficient documentation

## 2011-04-03 DIAGNOSIS — J449 Chronic obstructive pulmonary disease, unspecified: Secondary | ICD-10-CM | POA: Insufficient documentation

## 2011-04-03 DIAGNOSIS — J4489 Other specified chronic obstructive pulmonary disease: Secondary | ICD-10-CM | POA: Insufficient documentation

## 2011-04-03 DIAGNOSIS — E860 Dehydration: Principal | ICD-10-CM | POA: Insufficient documentation

## 2011-04-03 LAB — DIFFERENTIAL
Basophils Absolute: 0 10*3/uL (ref 0.0–0.1)
Basophils Relative: 0 % (ref 0–1)
Monocytes Absolute: 0.8 10*3/uL (ref 0.1–1.0)
Neutro Abs: 5.8 10*3/uL (ref 1.7–7.7)
Neutrophils Relative %: 75 % (ref 43–77)

## 2011-04-03 LAB — URINALYSIS, ROUTINE W REFLEX MICROSCOPIC
Bilirubin Urine: NEGATIVE
Hgb urine dipstick: NEGATIVE
Nitrite: NEGATIVE
Protein, ur: NEGATIVE mg/dL
Urobilinogen, UA: 0.2 mg/dL (ref 0.0–1.0)

## 2011-04-03 LAB — TROPONIN I: Troponin I: <0.3 ng/mL (ref <0.30)

## 2011-04-03 LAB — COMPREHENSIVE METABOLIC PANEL
ALT: 15 U/L (ref 0–35)
Albumin: 4 g/dL (ref 3.5–5.2)
Alkaline Phosphatase: 96 U/L (ref 39–117)
Glucose, Bld: 110 mg/dL — ABNORMAL HIGH (ref 70–99)
Potassium: 3.1 mEq/L — ABNORMAL LOW (ref 3.5–5.1)
Sodium: 141 mEq/L (ref 135–145)
Total Protein: 7.7 g/dL (ref 6.0–8.3)

## 2011-04-03 LAB — CBC
Hemoglobin: 13.1 g/dL (ref 12.0–15.0)
MCHC: 35.8 g/dL (ref 30.0–36.0)
RDW: 13.2 % (ref 11.5–15.5)
WBC: 7.8 10*3/uL (ref 4.0–10.5)

## 2011-04-03 LAB — CK TOTAL AND CKMB (NOT AT ARMC)
CK, MB: 5.8 ng/mL — ABNORMAL HIGH (ref 0.3–4.0)
Relative Index: 5.8 — ABNORMAL HIGH (ref 0.0–2.5)

## 2011-04-03 LAB — URINE MICROSCOPIC-ADD ON

## 2011-04-03 MED ORDER — IOHEXOL 300 MG/ML  SOLN
100.0000 mL | Freq: Once | INTRAMUSCULAR | Status: AC | PRN
  Administered 2011-04-03: 100 mL via INTRAVENOUS

## 2011-04-03 NOTE — Telephone Encounter (Signed)
[facility] is at [facility]. They took her bp it was 152/108, pt has been vomiting since 7/25 and hasn't kept water down. Her pulse was 96 and is now 60. She is pale and shaky. I spoke to pt and is constipated with high abd. Pain. She has been siping on soda. Her last meal was early Sunday and doesn't feel like eating anything due to nausea.   Per Dr. Fabian Sharp- Pt needs to go to the ED to be evaluated.

## 2011-04-03 NOTE — Telephone Encounter (Signed)
Doesn't sound like GE   Concern about Bowel obstruc pattern with constipation abd pain and vomiting  For days.

## 2011-04-03 NOTE — Telephone Encounter (Signed)
Dana from [facility] calls b/c pt is vomiting. She states pt has been vomiting "everything she eats since Sunday".  Her blood pressure is 152/108 pulse 96 now is 60.   Advised to go to ED and call pcp.  Annabelle Harman will talk with pt and see what she wants to do.  She is not having any chest pain or anginal symptoms.  She does have someone to drive her if she does not want to call 911 Mylo Red RN

## 2011-04-04 ENCOUNTER — Ambulatory Visit: Payer: PRIVATE HEALTH INSURANCE | Admitting: Physical Therapy

## 2011-04-05 ENCOUNTER — Ambulatory Visit: Payer: PRIVATE HEALTH INSURANCE | Admitting: Occupational Therapy

## 2011-04-05 ENCOUNTER — Encounter: Payer: Self-pay | Admitting: Internal Medicine

## 2011-04-05 ENCOUNTER — Ambulatory Visit (INDEPENDENT_AMBULATORY_CARE_PROVIDER_SITE_OTHER): Payer: PRIVATE HEALTH INSURANCE | Admitting: Internal Medicine

## 2011-04-05 VITALS — BP 130/72 | HR 91 | Temp 97.5°F | Wt 186.0 lb

## 2011-04-05 DIAGNOSIS — I25810 Atherosclerosis of coronary artery bypass graft(s) without angina pectoris: Secondary | ICD-10-CM

## 2011-04-05 DIAGNOSIS — R439 Unspecified disturbances of smell and taste: Secondary | ICD-10-CM

## 2011-04-05 DIAGNOSIS — Z8673 Personal history of transient ischemic attack (TIA), and cerebral infarction without residual deficits: Secondary | ICD-10-CM

## 2011-04-05 DIAGNOSIS — R112 Nausea with vomiting, unspecified: Secondary | ICD-10-CM

## 2011-04-05 DIAGNOSIS — R431 Parosmia: Secondary | ICD-10-CM

## 2011-04-05 DIAGNOSIS — L299 Pruritus, unspecified: Secondary | ICD-10-CM

## 2011-04-05 DIAGNOSIS — M542 Cervicalgia: Secondary | ICD-10-CM

## 2011-04-05 DIAGNOSIS — E876 Hypokalemia: Secondary | ICD-10-CM

## 2011-04-05 DIAGNOSIS — K5900 Constipation, unspecified: Secondary | ICD-10-CM

## 2011-04-05 DIAGNOSIS — E039 Hypothyroidism, unspecified: Secondary | ICD-10-CM

## 2011-04-05 LAB — BASIC METABOLIC PANEL
CO2: 33 mEq/L — ABNORMAL HIGH (ref 19–32)
Glucose, Bld: 82 mg/dL (ref 70–99)
Potassium: 2.7 mEq/L — CL (ref 3.5–5.1)
Sodium: 141 mEq/L (ref 135–145)

## 2011-04-05 NOTE — Progress Notes (Signed)
Subjective:    Patient ID: [name], female    DOB: 02-25-1956, 55 y.o.   MRN: [id number]  HPI  Patient comes in today for followup from hospital ED  visit  7/ 25 where she presented with nausea vomiting for 3 days with epigastric pain and constipation. She had an evaluation which showed a potassium level of 3.1 and abdominal CT the chest show constipation. She was given IV fluids including potassium and sent home with Phenergan. She hasn't taken it because of concern about sedation side effects.  ? If whole thing was triggered  By potassium  Prep  she has a hard time swallowing the potassium anyway and then the smells make her nauseous she was given a liquid version of potassium by Dr. Tedra Senegal has been having a hard time keeping it down. Wonders what else to do. Today she has no significant abdominal pain but does feel a bit weak and washed out as usual. There is no fever or vomiting today or diarrhea  .  She is on Lasix once a day and this hasn't changed.  She has  recurrence of her left neck pain that was diagnosed as a blocked salivary didn't gland in the past. She had to cancel and reschedule her neurologic appointment because of her visit to the emergency room. She still has difficulty with smells and swallowing. There are no new neurologic events. She is taking magnesium for a day.   Review of Systems No new chest pain but some from vomiting and lower terminal discomfort. Constipation as above. Itching all over that she has had for a while needs to change dermatologist because of network problems.    Objective:   Physical Exam Well developed moderately nourished not acute but tired and more frail appearing in no acute distress. She is nonicteric her speech is monotone she has some left hemiparesthesias. Thought process appears to be normal. HEENT normocephalic eyes clear OP no obvious lesions left neck no major masses but some tenderness in the left salivary gland area Chest:  Clear  to A&P without wheezes rales or rhonchi CV:  S1-S2 no gallops or murmurs peripheral perfusion is normal Abdomen:  Sof,t normal bowel sounds without hepatosplenomegaly, no guarding rebound or masses no CVA tenderness   CBC was normal in the emergency department chemistries showed a creatinine of 1.4 potassium 3.1 lipase was normal troponin negative UA 7-10 wbcxs    Assessment & Plan:  Status post nausea vomiting constipation Could be from medications she thinks from the potassium but also perhaps the tramadol although she's only taking it a couple times a day. Hypokalemia and history of low magnesium  problem taking potassium supplement.  On Lasix  Dysphasia and aversion to certain smells and tastes. She is having difficulty taking her potassium but is able to take magnesium. A recent change to liquid which hasn't really helped I discussed the importance of taking her medicine and we'll check her level today however discussed with her pharmacy other options.   Left neck pain presumed recurrent  salivary gland problem of them ensure that today no alarm symptoms with this. Because of her problems with taste smell and left neck pain we discussed seeing the ear nose and throat doctor will do a referral.  Itching she's had this off and on has Dr. Danella Deis is a dermatologist but now out of network. This is possibly a medicine but she wants to see a dermatologist. Maryclare Labrador do referral.   We'll get BMP TSH  and magnesium level today was due for this next week anyway and plan followup.  Total visit > 50% spent counseling and coordinating care

## 2011-04-05 NOTE — Patient Instructions (Signed)
Will notify you  of labs when available. Will do ENT .    Consults about the pain and smell issues .  Consitpation and urin ary issues. could be from medication Will Do derm  Consult.

## 2011-04-08 ENCOUNTER — Other Ambulatory Visit: Payer: PRIVATE HEALTH INSURANCE

## 2011-04-08 ENCOUNTER — Emergency Department (HOSPITAL_COMMUNITY)
Admission: EM | Admit: 2011-04-08 | Discharge: 2011-04-08 | Disposition: A | Discharge status: Home / Self Care | Payer: PRIVATE HEALTH INSURANCE | Attending: Emergency Medicine | Admitting: Emergency Medicine

## 2011-04-08 ENCOUNTER — Telehealth: Payer: Self-pay | Admitting: *Deleted

## 2011-04-08 ENCOUNTER — Ambulatory Visit: Payer: PRIVATE HEALTH INSURANCE | Admitting: Physical Medicine & Rehabilitation

## 2011-04-08 ENCOUNTER — Encounter: Payer: PRIVATE HEALTH INSURANCE | Attending: Physical Medicine & Rehabilitation

## 2011-04-08 ENCOUNTER — Emergency Department (HOSPITAL_COMMUNITY): Payer: PRIVATE HEALTH INSURANCE

## 2011-04-08 DIAGNOSIS — J4489 Other specified chronic obstructive pulmonary disease: Secondary | ICD-10-CM | POA: Insufficient documentation

## 2011-04-08 DIAGNOSIS — J449 Chronic obstructive pulmonary disease, unspecified: Secondary | ICD-10-CM | POA: Insufficient documentation

## 2011-04-08 DIAGNOSIS — Z79899 Other long term (current) drug therapy: Secondary | ICD-10-CM | POA: Insufficient documentation

## 2011-04-08 DIAGNOSIS — E876 Hypokalemia: Secondary | ICD-10-CM | POA: Insufficient documentation

## 2011-04-08 DIAGNOSIS — K5900 Constipation, unspecified: Secondary | ICD-10-CM | POA: Insufficient documentation

## 2011-04-08 DIAGNOSIS — E039 Hypothyroidism, unspecified: Secondary | ICD-10-CM | POA: Insufficient documentation

## 2011-04-08 DIAGNOSIS — I2510 Atherosclerotic heart disease of native coronary artery without angina pectoris: Secondary | ICD-10-CM | POA: Insufficient documentation

## 2011-04-08 DIAGNOSIS — R5383 Other fatigue: Secondary | ICD-10-CM | POA: Insufficient documentation

## 2011-04-08 DIAGNOSIS — R1110 Vomiting, unspecified: Secondary | ICD-10-CM | POA: Insufficient documentation

## 2011-04-08 DIAGNOSIS — R5381 Other malaise: Secondary | ICD-10-CM | POA: Insufficient documentation

## 2011-04-08 DIAGNOSIS — I10 Essential (primary) hypertension: Secondary | ICD-10-CM | POA: Insufficient documentation

## 2011-04-08 DIAGNOSIS — I69959 Hemiplegia and hemiparesis following unspecified cerebrovascular disease affecting unspecified side: Secondary | ICD-10-CM | POA: Insufficient documentation

## 2011-04-08 DIAGNOSIS — Z8673 Personal history of transient ischemic attack (TIA), and cerebral infarction without residual deficits: Secondary | ICD-10-CM | POA: Insufficient documentation

## 2011-04-08 DIAGNOSIS — M75100 Unspecified rotator cuff tear or rupture of unspecified shoulder, not specified as traumatic: Secondary | ICD-10-CM

## 2011-04-08 DIAGNOSIS — G4733 Obstructive sleep apnea (adult) (pediatric): Secondary | ICD-10-CM | POA: Insufficient documentation

## 2011-04-08 LAB — POCT I-STAT, CHEM 8
Chloride: 100 mEq/L (ref 96–112)
Creatinine, Ser: 1.1 mg/dL (ref 0.50–1.10)
Glucose, Bld: 95 mg/dL (ref 70–99)
Potassium: 3.2 mEq/L — ABNORMAL LOW (ref 3.5–5.1)

## 2011-04-08 NOTE — Consult Note (Signed)
[name], [name] NO.:  [id number]  MEDICAL RECORD NO.:  [id number]  LOCATION:  2508                         FACILITY:  [facility]  PHYSICIAN:  Colleen Can. Deborah Chalk, M.D.DATE OF BIRTH:  09/14/1955  DATE OF CONSULTATION:  02/12/2011 DATE OF DISCHARGE:                                CONSULTATION   Thank you very much for the opportunity to see [name] for evaluation of bradycardia.  Tina is a very pleasant 55 year old female with a complex medical history.  Tina had the onset of coronary artery disease in approximately 2007, had stents placed, and subsequently lead to coronary artery bypass grafting in 2007.  This was complicated by a sternal wound infection with mediastinitis and Tina had Tina sternum debrided and subsequent flaps to recreate Tina sternal and anterior chest repair.  Tina has been having chronic chest pain since that time.  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  Tina has had episodes of recurrent TIAs on at least 2-3 occasions since that time. Tina had one episode of urinary tract infection that also presented as if Tina was having recurrent neurologic symptoms.  The current issue started over the last couple of weeks but particularly on February 10, 2011.  Tina son noted that Tina had more slurred speech and appeared to be "drugged."  It was noted that in the emergency room that Tina was hypotensive with bradycardia at times during these events.  Tina has had issues with insurance reasons for having a CPAP over the last 2 weeks.  Tina is off Provigil over the last 2 weeks also because of insurance reasons.  Tina has been taking Provigil for management of daytime drowsiness.  Other medical problems include a history of COPD related to approximately 32-pack years of smoking.  Tina stopped smoking 4-1/2 years ago.  Tina has a longstanding history of hypertension that has been well- controlled.  Tina has a history of sleep  apnea secondary obesity but Tina has lost approximately 20-30 pounds of weight since Tina stroke.  Tina has had a history of hypothyroidism created by radioactive iodine and has been on replacement.  Tina has had a history of intermittent depression in the past.  Tina has been on metoprolol for several years in large part for management of Tina hypertension.  ALLERGIES:  PENICILLIN, SULFA, CODEINE, and AVELOX.  ADMISSION MEDICATIONS: 1. Provigil (not taken for about 10 days to 2 weeks). 2. Lopressor 25 mg b.i.d. 3. Plavix. 4. Crestor. 5. Synthroid 137 mcg per day. 6. Temazepam. 7. Tramadol. 8. Zanaflex.  REVIEW OF SYSTEMS:  Remarkable for some intermittent spasms in Tina legs as well as some tingling.  Tina does have a history of emphysema secondary to Tina cigarette smoking.  Tina has been more depressed since Tina stroke.  Tina has had poor conditioning since Tina had Tina sternal repair.  SOCIAL HISTORY:  Tina is currently homeless and lives in a hotel.  Tina son has been taking care of Tina as Tina is anticipating an apartment in 1- 2 weeks.  Tina denies any drug or tobacco abuse.  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  Tina graduated from [employer] in May 2011 with a degree in psychology.  PAST SURGICAL HISTORY:  As noted above and include bladder repair, arthroscopy, and mastoid surgery on the left ear.  PHYSICAL EXAMINATION:  VITAL SIGNS:  Current heart rate is 55.  Blood pressure is 148/75.  Temperature is 98.  Respirations 23.  Room air O2 sats 98%. GENERAL:  Tina is pleasant and gives appropriate history.  Tina has mild left facial palsy and mild decreased movement on Tina left side with Tina arm being weaker than Tina leg. HEENT:  Unremarkable otherwise. NECK:  Supple without bruits. LUNGS:  Clear. HEART:  Bradycardia but without murmur. ABDOMEN:  Soft and nontender. EXTREMITIES:  Without edema.  PERTINENT LABORATORY DATA:  Tina TSH is 0.79.  Urine  culture showed no growth.  Tina total cholesterol is 134, HDL is 40, and LDL is 75.  Tina BUN is 15, creatinine 0.8, and potassium is 3.9.  Pro-BNP was 238.  2-D echocardiogram showed LV wall thickness with slight increase in pattern of mild LVH with a normal ejection fraction.  Left atrium was mildly enlarged.  IMPRESSION: 1. Bradycardia with associated hypotension, probably associated with     recurrent neurological symptoms in watershed areas. 2. Status post coronary artery bypass grafting with sternal wound     infection in 2007. 3. Left hemiplegia secondary to cerebrovascular accident on November 07, 2010. 4. History of daytime drowsiness, on Provigil until 2 weeks ago     secondary to insurance. 5. Sleep apnea, off CPAP for approximately 2 weeks secondary to     insurance reasons.  PLAN: 1. I agree with holding metoprolol. 2. I would like to avoid permanent transvenous pacemaker if at all     possible for several reasons of which some include the history of     staph infection in Tina sternum.  It would be satisfactory to place     a pacemaker in Tina sternum if needed. 3. While not on Provigil, it may be beneficial with increased     alertness and I do not think we need to be     concerned about tachycardia at this point in time. 4. I would like to watch on telemetry for approximately 24-48 hours to     see if heart rate increases off metoprolol. 5. I would restart CPAP.  We will follow with you on telemetry.     Colleen Can. Deborah Chalk, M.D.     SNT/MEDQ  D:  02/12/2011  T:  02/13/2011  Job:  [id number]  cc:   Arturo Morton. Riley Kill, MD, [facility]  Electronically Signed by Roger Shelter M.D. on 04/08/2011 11:16:18 AM

## 2011-04-08 NOTE — Telephone Encounter (Signed)
Message copied by Romualdo Bolk on Mon Apr 08, 2011  9:29 AM ------      Message from: [facility], [place] K      Created: Fri Apr 05, 2011  5:04 PM       Tell patient that her potassium is now even lower than when she went to the emergency room and now in the  critical range   2.7 . Her magnesium is low normal.              She needs to stop the Lasix over the weekend and she absolutely  has to take oral potassium twice a day and if she cannot do this then she should go to the emergency room again to get IV  .              Her potassium and creatinine should be repeated next week or depending on how she is doing

## 2011-04-08 NOTE — Telephone Encounter (Signed)
Pt called saying that she can't keep the potassium down. She is going to go to the ED to get IV fluids.

## 2011-04-08 NOTE — Telephone Encounter (Signed)
Pt aware of this. Pt is to come in on Friday for labs to be done. Order placed in Epic.

## 2011-04-09 ENCOUNTER — Ambulatory Visit: Payer: PRIVATE HEALTH INSURANCE | Admitting: Physical Therapy

## 2011-04-09 ENCOUNTER — Ambulatory Visit: Payer: PRIVATE HEALTH INSURANCE | Admitting: Occupational Therapy

## 2011-04-09 DIAGNOSIS — F331 Major depressive disorder, recurrent, moderate: Secondary | ICD-10-CM

## 2011-04-09 NOTE — Procedures (Signed)
[name], [name] NO.:  [id number]  MEDICAL RECORD NO.:  [id number]           PATIENT TYPE:  O  LOCATION:  [facility]H                         FACILITY:  [facility]  PHYSICIAN:  Erick Colace, M.D.DATE OF BIRTH:  08-24-1956  DATE OF PROCEDURE: DATE OF DISCHARGE:                              OPERATIVE REPORT  This is a left shoulder complete ultrasound.  INDICATION:  Left shoulder pain, status post CVA.  PROCEDURE:  The patient was placed in a seated position.  She was positioned without difficulty.  The right biceps tendon was measured at 0.5 cm which is increased compared to normal.  Longitudinal views were taken as well biceps tendon, no discontinuity.  There was some irregularity of the bicipital groove.  The infraspinatus long axis view showed no evidence of discontinuity, no evidence of tears.  The supraspinatus tendon long axis thickness was 0.23 cm which is reduced. There is evidence of articular surface tear extending at least 1.7 cm. Supraspinatus short axis view measurements of 0.27 cm and saw no lucency at the articular surface.  The short axis infraspinatus was 0.2.  The subacromial bursa was normal at less than or equal to 2 mm.  AC joint showed no evidence of joint capsule tear.  Articular surface was mildly irregular.  IMPRESSION: 1. Abnormal study. 2. Evidence of left biceps tendinosis. 3. Evidence of articular surface supraspinatus tear, partial.  Results     discussed with the patient.     Erick Colace, M.D. Electronically Signed    AEK/MEDQ  D:  04/09/2011 09:41:13  T:  04/09/2011 12:57:58  Job:  [id number]

## 2011-04-10 ENCOUNTER — Telehealth: Payer: Self-pay | Admitting: Cardiology

## 2011-04-10 NOTE — Telephone Encounter (Signed)
Pt called and has several things she needs to discuss.  Please call her when you get a few minutes.

## 2011-04-11 ENCOUNTER — Telehealth: Payer: Self-pay | Admitting: *Deleted

## 2011-04-11 NOTE — Telephone Encounter (Signed)
lmtcb

## 2011-04-11 NOTE — Telephone Encounter (Signed)
Pt wanted Dr. Riley Kill to know that pt stopped lasix and double pt potassium. Pt wanted to know what the ramifications of that were if regards to heart failure. Pt said wanted MD to also know he can go ahead and starting working towards scheduling pts cardio-pulmonary rehab. Please return pt call to advise/discuss.

## 2011-04-12 ENCOUNTER — Ambulatory Visit: Payer: PRIVATE HEALTH INSURANCE | Admitting: Occupational Therapy

## 2011-04-12 ENCOUNTER — Other Ambulatory Visit (INDEPENDENT_AMBULATORY_CARE_PROVIDER_SITE_OTHER): Payer: PRIVATE HEALTH INSURANCE

## 2011-04-12 ENCOUNTER — Ambulatory Visit: Payer: PRIVATE HEALTH INSURANCE | Attending: Physical Medicine & Rehabilitation | Admitting: Physical Therapy

## 2011-04-12 DIAGNOSIS — I10 Essential (primary) hypertension: Secondary | ICD-10-CM

## 2011-04-12 DIAGNOSIS — I69919 Unspecified symptoms and signs involving cognitive functions following unspecified cerebrovascular disease: Secondary | ICD-10-CM | POA: Insufficient documentation

## 2011-04-12 DIAGNOSIS — R269 Unspecified abnormalities of gait and mobility: Secondary | ICD-10-CM | POA: Insufficient documentation

## 2011-04-12 DIAGNOSIS — I69998 Other sequelae following unspecified cerebrovascular disease: Secondary | ICD-10-CM | POA: Insufficient documentation

## 2011-04-12 DIAGNOSIS — M6281 Muscle weakness (generalized): Secondary | ICD-10-CM | POA: Insufficient documentation

## 2011-04-12 DIAGNOSIS — R279 Unspecified lack of coordination: Secondary | ICD-10-CM | POA: Insufficient documentation

## 2011-04-12 DIAGNOSIS — Z5189 Encounter for other specified aftercare: Secondary | ICD-10-CM | POA: Insufficient documentation

## 2011-04-12 LAB — BASIC METABOLIC PANEL
BUN: 14 mg/dL (ref 6–23)
CO2: 30 mEq/L (ref 19–32)
Chloride: 104 mEq/L (ref 96–112)
Creatinine, Ser: 1 mg/dL (ref 0.4–1.2)
Glucose, Bld: 88 mg/dL (ref 70–99)

## 2011-04-16 ENCOUNTER — Ambulatory Visit: Payer: PRIVATE HEALTH INSURANCE | Admitting: Physical Therapy

## 2011-04-16 ENCOUNTER — Ambulatory Visit: Payer: PRIVATE HEALTH INSURANCE | Admitting: Occupational Therapy

## 2011-04-16 ENCOUNTER — Telehealth: Payer: Self-pay | Admitting: *Deleted

## 2011-04-16 NOTE — Telephone Encounter (Signed)
[name] wanted to inform Dr. Riley Kill that Dr. Fabian Sharp stopped her Lasix due to hypokalemia. Her Potassium was increased. She denies LE edema, weight gain, or SOB. Informed her to contact us if she starts to experience any of these symptoms. She is to f/u with Dr.Panosh next week.

## 2011-04-16 NOTE — Telephone Encounter (Signed)
Would like lab results, please.

## 2011-04-17 NOTE — Telephone Encounter (Signed)
Pt aware that Dr. Fabian Sharp is out of the office this week and we will call her as soon as she looks at them.

## 2011-04-18 ENCOUNTER — Ambulatory Visit: Payer: PRIVATE HEALTH INSURANCE | Admitting: Occupational Therapy

## 2011-04-18 ENCOUNTER — Ambulatory Visit: Payer: PRIVATE HEALTH INSURANCE | Admitting: Physical Therapy

## 2011-04-23 ENCOUNTER — Encounter: Payer: Self-pay | Admitting: Internal Medicine

## 2011-04-23 ENCOUNTER — Ambulatory Visit (INDEPENDENT_AMBULATORY_CARE_PROVIDER_SITE_OTHER): Payer: PRIVATE HEALTH INSURANCE | Admitting: Internal Medicine

## 2011-04-23 ENCOUNTER — Telehealth: Payer: Self-pay | Admitting: *Deleted

## 2011-04-23 DIAGNOSIS — R790 Abnormal level of blood mineral: Secondary | ICD-10-CM

## 2011-04-23 DIAGNOSIS — E876 Hypokalemia: Secondary | ICD-10-CM

## 2011-04-23 DIAGNOSIS — G4733 Obstructive sleep apnea (adult) (pediatric): Secondary | ICD-10-CM

## 2011-04-23 DIAGNOSIS — R439 Unspecified disturbances of smell and taste: Secondary | ICD-10-CM

## 2011-04-23 DIAGNOSIS — R431 Parosmia: Secondary | ICD-10-CM

## 2011-04-23 DIAGNOSIS — R252 Cramp and spasm: Secondary | ICD-10-CM

## 2011-04-23 DIAGNOSIS — I25810 Atherosclerosis of coronary artery bypass graft(s) without angina pectoris: Secondary | ICD-10-CM

## 2011-04-23 DIAGNOSIS — F4321 Adjustment disorder with depressed mood: Secondary | ICD-10-CM

## 2011-04-23 NOTE — Progress Notes (Signed)
  Subjective:    Patient ID: [name], female    DOB: [birth year], 55 y.o.   MRN: [id number]  HPI Patient comes in today for follow up of  multiple medical problems.   Since last visit  She has seen ENT for abnormal smell and poor taste  Difficulties taking some foods and meds  ENT   No intervention for now. To see Dr Pearlean Brownie next week.  Trying to take 15 cc kcl bid ( 20 bid ) very difficult.No cough cp sob currently still feels week and has muscle cramps. Taking 4 slo mag per day.  Is holding lasix temporarily as requested to help with  Low potassium and mg that has taken her to the ed in the recent  past. . No edema  . To fu with dr Riley Kill soon.  Mood : now in cymbalta with some help but depressed about finances.  On cpap for slttp apnea  Recently finished doxycycline for infection  Review of Systems NO fever vision change  Falling .  Had rehab.  No cough sob. See hpi no new CP sob. No edema  Past history family history social history reviewed in the electronic medical record.     Objective:   Physical Exam WDWN in nad  oriented and speech normal except somewhat flat.  Cognition seems adequate.  Neck supple no masses Chest:  Clear to A&P without wheezes rales or rhonchi CV:  S1-S2 no gallops or murmurs peripheral perfusion is normal Abdomen:  Sof,t normal bowel sounds without hepatosplenomegaly, no guarding rebound or masses no CVA tenderness Ext no fasciculations left arm minimal weakness decrease grip no  atrophy     Assessment & Plan:  Hypokalemia Low magnesium  Felt to be from lasix and ppi and poor intake with potassium although seems quite severe for this situation but is  adding to her weak feeling. Otherwise recovering from major stroke and under treatment by psych for depression. Loss of smell adding to intake issues

## 2011-04-23 NOTE — Patient Instructions (Signed)
Will notify you  of labs when available. And then plan   Of follow up.

## 2011-04-23 NOTE — Telephone Encounter (Signed)
Message copied by Romualdo Bolk on Tue Apr 23, 2011  4:57 PM ------      Message from: [facility], [place] K      Created: Tue Apr 23, 2011  4:40 PM       Tell patient that magnesium is dangerously low again.    Increase magnesium to 6-8 per day for the next week  . Her potassium is 3.0 still low .        I am unsure why she has such a continued low magnesium now off the lasix.  It is possible that the protonix is interfering  with the magnesium absorption  But usually not that severe.  Continue same dose of potassium .            Repeat magnesium  And  Bmp in  1 week.

## 2011-04-23 NOTE — Telephone Encounter (Signed)
Pt aware and will call back to schedule an appt 

## 2011-04-26 ENCOUNTER — Ambulatory Visit: Payer: PRIVATE HEALTH INSURANCE | Admitting: Occupational Therapy

## 2011-04-28 ENCOUNTER — Encounter: Payer: Self-pay | Admitting: Internal Medicine

## 2011-04-30 ENCOUNTER — Ambulatory Visit: Payer: PRIVATE HEALTH INSURANCE | Admitting: Occupational Therapy

## 2011-05-01 NOTE — Progress Notes (Signed)
See phone note about this. Pt is already aware of this.

## 2011-05-02 ENCOUNTER — Ambulatory Visit: Payer: PRIVATE HEALTH INSURANCE | Admitting: Occupational Therapy

## 2011-05-06 ENCOUNTER — Encounter: Payer: PRIVATE HEALTH INSURANCE | Admitting: Physical Medicine & Rehabilitation

## 2011-05-07 ENCOUNTER — Encounter: Payer: PRIVATE HEALTH INSURANCE | Admitting: Occupational Therapy

## 2011-05-07 ENCOUNTER — Encounter: Payer: PRIVATE HEALTH INSURANCE | Attending: Physical Medicine & Rehabilitation

## 2011-05-07 ENCOUNTER — Ambulatory Visit: Payer: PRIVATE HEALTH INSURANCE | Admitting: Physical Medicine & Rehabilitation

## 2011-05-07 DIAGNOSIS — Z951 Presence of aortocoronary bypass graft: Secondary | ICD-10-CM | POA: Insufficient documentation

## 2011-05-07 DIAGNOSIS — Z8673 Personal history of transient ischemic attack (TIA), and cerebral infarction without residual deficits: Secondary | ICD-10-CM | POA: Insufficient documentation

## 2011-05-07 DIAGNOSIS — R259 Unspecified abnormal involuntary movements: Secondary | ICD-10-CM | POA: Insufficient documentation

## 2011-05-07 DIAGNOSIS — M25519 Pain in unspecified shoulder: Secondary | ICD-10-CM | POA: Insufficient documentation

## 2011-05-07 DIAGNOSIS — M7520 Bicipital tendinitis, unspecified shoulder: Secondary | ICD-10-CM | POA: Insufficient documentation

## 2011-05-07 DIAGNOSIS — M75100 Unspecified rotator cuff tear or rupture of unspecified shoulder, not specified as traumatic: Secondary | ICD-10-CM

## 2011-05-07 DIAGNOSIS — R209 Unspecified disturbances of skin sensation: Secondary | ICD-10-CM | POA: Insufficient documentation

## 2011-05-08 ENCOUNTER — Ambulatory Visit: Payer: PRIVATE HEALTH INSURANCE | Admitting: Occupational Therapy

## 2011-05-08 DIAGNOSIS — F331 Major depressive disorder, recurrent, moderate: Secondary | ICD-10-CM

## 2011-05-08 NOTE — Assessment & Plan Note (Signed)
HISTORY:  A 55 year old female who had a right MCA distribution infarct. She has had previous CABG with post sternotomy pain syndrome.  She had left shoulder ultrasound for persistent left shoulder pain, had evidence of partial tear of the supraspinatus articular surface.  In addition, the patient had evidence of fluid around the biceps tendon as well as some irregularity around the bicipital tendon groove.  She is having more anterior shoulder pain, although her range of motion is improved after some further therapy.  She is starting cardiopulmonary rehab next month.  She continues to have some memory and focus problems since her right MCA distribution stroke.  She needs assistance with meal prep, household duties, and shopping, but otherwise is independent.  REVIEW OF SYSTEMS:  Numbness, tremor, tingling, confusion, depression, anxiety, suicidal thoughts, more passive ideation nothing active, limb swelling, shortness of breath.  Sees Dr. Carmon Ginsberg at [facility] for psychiatry .  SOCIAL HISTORY:  Divorced, lives alone.  She has been taken off of Effexor and started on the Cymbalta.  PHYSICAL EXAMINATION:  VITAL SIGNS:  Blood pressure 140/94, pulse 76, respirations 18, and O2 sat 98% on room air. GENERAL:  No acute stress.  Mood and affect appropriate. NEUROLOGIC:  Her left shoulder has mildly positive impingement sign. She has more pain with resisted biceps flexion as well as tenderness over the bicipital tendon, sheath or bicipital groove.  Her upper extremity strength is 4-/5  in deltoid, biceps, grip.  She has decreased fine motor in left upper extremity, some hyperactive reflexes, but no overt spasticity.  IMPRESSION:  Bicipital tendonitis I believe this is causing the majority of her shoulder pain.  No evidence of RSD.  She does have some elements of her rotator cuff syndrome as well.  PLAN: 1. We will set up for bicipital tendon sheath injection under  ultrasound guidance. 2. Trial Voltaren gel first.  Certainly if this is very helpful, may     be able to skip the injection.  Discussed with the patient and     agrees with plan.  Continue outpatient therapy.     Erick Colace, M.D. Electronically Signed    AEK/MedQ D:  05/07/2011 13:57:38  T:  05/07/2011 16:45:54  Job #:  [id number]  cc:   Neta Mends. Fabian Sharp, MD [address] [place], [place] [address]  Arturo Morton. Riley Kill, MD, [facility] [address]  [address] [place] [address]

## 2011-05-14 ENCOUNTER — Ambulatory Visit: Payer: PRIVATE HEALTH INSURANCE | Attending: Internal Medicine | Admitting: Occupational Therapy

## 2011-05-14 DIAGNOSIS — R279 Unspecified lack of coordination: Secondary | ICD-10-CM | POA: Insufficient documentation

## 2011-05-14 DIAGNOSIS — R269 Unspecified abnormalities of gait and mobility: Secondary | ICD-10-CM | POA: Insufficient documentation

## 2011-05-14 DIAGNOSIS — Z5189 Encounter for other specified aftercare: Secondary | ICD-10-CM | POA: Insufficient documentation

## 2011-05-14 DIAGNOSIS — I69998 Other sequelae following unspecified cerebrovascular disease: Secondary | ICD-10-CM | POA: Insufficient documentation

## 2011-05-14 DIAGNOSIS — M6281 Muscle weakness (generalized): Secondary | ICD-10-CM | POA: Insufficient documentation

## 2011-05-14 DIAGNOSIS — I69919 Unspecified symptoms and signs involving cognitive functions following unspecified cerebrovascular disease: Secondary | ICD-10-CM | POA: Insufficient documentation

## 2011-05-21 DIAGNOSIS — F331 Major depressive disorder, recurrent, moderate: Secondary | ICD-10-CM

## 2011-05-22 ENCOUNTER — Ambulatory Visit (INDEPENDENT_AMBULATORY_CARE_PROVIDER_SITE_OTHER): Payer: PRIVATE HEALTH INSURANCE | Admitting: Cardiology

## 2011-05-22 ENCOUNTER — Other Ambulatory Visit (INDEPENDENT_AMBULATORY_CARE_PROVIDER_SITE_OTHER): Payer: PRIVATE HEALTH INSURANCE | Admitting: *Deleted

## 2011-05-22 ENCOUNTER — Encounter: Payer: Self-pay | Admitting: Cardiology

## 2011-05-22 DIAGNOSIS — E876 Hypokalemia: Secondary | ICD-10-CM

## 2011-05-22 DIAGNOSIS — R790 Abnormal level of blood mineral: Secondary | ICD-10-CM

## 2011-05-22 DIAGNOSIS — E039 Hypothyroidism, unspecified: Secondary | ICD-10-CM

## 2011-05-22 DIAGNOSIS — I2510 Atherosclerotic heart disease of native coronary artery without angina pectoris: Secondary | ICD-10-CM

## 2011-05-22 DIAGNOSIS — I5032 Chronic diastolic (congestive) heart failure: Secondary | ICD-10-CM

## 2011-05-22 DIAGNOSIS — Z8673 Personal history of transient ischemic attack (TIA), and cerebral infarction without residual deficits: Secondary | ICD-10-CM

## 2011-05-22 DIAGNOSIS — I10 Essential (primary) hypertension: Secondary | ICD-10-CM

## 2011-05-22 LAB — HEPATIC FUNCTION PANEL
ALT: 15 U/L (ref 0–35)
AST: 24 U/L (ref 0–37)
Alkaline Phosphatase: 67 U/L (ref 39–117)
Bilirubin, Direct: 0.1 mg/dL (ref 0.0–0.3)
Total Bilirubin: 0.8 mg/dL (ref 0.3–1.2)

## 2011-05-22 LAB — BASIC METABOLIC PANEL
Creatinine, Ser: 1.1 mg/dL (ref 0.4–1.2)
Potassium: 2.6 mEq/L — CL (ref 3.5–5.1)

## 2011-05-22 NOTE — Assessment & Plan Note (Signed)
Controlled.  Despite low K, BP is controlled, and does not have the appearance of a hyperaldo state.

## 2011-05-22 NOTE — Assessment & Plan Note (Addendum)
Will be rechecked today--she missed last week at gen med and send to Dr. Fabian Sharp.  I do not understand mechanism.  No diuretic or diahreaa.  Not eating at present.  Depression has zapped appetite.  Will discuss with Dr. Fabian Sharp and recheck levels.  She has had some nausea and vomiting, but not seemingly enough to account for levels of K and Mg.  If these remain this low, she may require admission to get fixed as this has been going on for a bit.

## 2011-05-22 NOTE — Patient Instructions (Addendum)
Your physician recommends that you return for lab work in: today  (BMET, Hepatic, Magnesium, TSH)  (414.01)  Your physician recommends that you schedule a follow-up appointment in: 2 months

## 2011-05-22 NOTE — Progress Notes (Signed)
HPI:  She has been incredibly depressed.  Her meds have been adjusted.  She has trouble concentrating and has not been eating.  She has had a low K and Mg, unclear reasons.  She has been followed in general medicine fairly closely, and missed her recent lab studies.    Denies any diahrrea, and denies taking a diuretic.  Had some feet swelling and mild SOB, but not obvious today.  Will eat popcorn to the exclusion of other things.   Chart reviewed regarding recent low levels of k and Mg.    Current Outpatient Prescriptions  Medication Sig Dispense Refill  . amLODipine (NORVASC) 5 MG tablet Take 1 tablet (5 mg total) by mouth daily.  30 tablet  9  . Armodafinil (NUVIGIL) 150 MG tablet Take 1 tablet (150 mg total) by mouth daily.  30 tablet  2  . aspirin 81 MG tablet Take 81 mg by mouth daily.        . clopidogrel (PLAVIX) 75 MG tablet Take 1 tablet (75 mg total) by mouth daily.  30 tablet  6  . diclofenac sodium (VOLTAREN) 1 % GEL Apply 1 application topically 4 (four) times daily.        . DULoxetine (CYMBALTA) 30 MG capsule Take 30 mg by mouth daily.        . DULoxetine (CYMBALTA) 60 MG capsule Take 60 mg by mouth daily.       . fluocinonide (LIDEX) 0.05 % ointment Apply topically 2 (two) times daily.  30 g  0  . levothyroxine (SYNTHROID, LEVOTHROID) 137 MCG tablet Take 137 mcg by mouth daily.  30 tablet  2  . magnesium chloride (SLOW-MAG) 64 MG TBEC Take 2 tablets by mouth 2 (two) times daily. Take 4 tabs per day       . pantoprazole (PROTONIX) 40 MG tablet Take 1 tablet (40 mg total) by mouth daily.  30 tablet  12  . potassium chloride 20 MEQ/15ML (10%) solution Take by mouth daily. 6 teaspoons daily       . rosuvastatin (CRESTOR) 5 MG tablet Take 1 tablet (5 mg total) by mouth daily.  30 tablet  9  . senna (SENOKOT) 8.6 MG tablet Take 1 tablet by mouth daily.        . temazepam (RESTORIL) 7.5 MG capsule Take 1 capsule (7.5 mg total) by mouth at bedtime as needed.  30 capsule  2  . tizanidine  (ZANAFLEX) 2 MG capsule Take 2 mg by mouth 2 (two) times daily.       . traMADol (ULTRAM) 50 MG tablet Take 50 mg by mouth as needed.          Allergies  Allergen Reactions  . Amoxicillin     REACTION: unspecified  . Avelox (Moxifloxacin Hcl In Nacl)   . Hydrocodone     REACTION: itching  . Penicillins     REACTION: hives  . Sulfonamide Derivatives     REACTION: ?    Past Medical History  Diagnosis Date  . CAD (coronary artery disease)     cath 09/2010: LAD stent patent, S-Int/dCFX ok, S-PDA ok, L-LAD atretic  . Hypertension   . Hx of CABG   . Diastolic dysfunction     TEE 11/2010: EF 60-65%, BAE, trivial atrial septal shunt;  right heart cath in 10/2010 with elevated R and L heart pressures and diuretic started  . HLD (hyperlipidemia)   . Hypothyroidism   . Sleep apnea   . COPD (chronic  obstructive pulmonary disease)   . Pulmonary nodules     repeat CT due in 11/2011  . Acute right MCA stroke 11/07/10  . Hx MRSA infection     Chest wall syndrome post CABG    Past Surgical History  Procedure Date  . Coronary artery bypass graft   . Debriment for infection in chest   . Chest wall reconstruction   . Bilateral knee surgery   . Bladder surgery   . Left mastoidectomy   . Hernia repair   . Cath 2012     Family History  Problem Relation Age of Onset  . COPD Mother   . Heart disease Mother   . Arthritis Mother     Rheumatoid and PMR  . Heart attack Father   . Depression Father   . Hypertension Father   . Osteoporosis Mother     Mom fractured hip  . Diabetes type II Mother     History   Social History  . Marital Status: Divorced    Spouse Name: N/A    Number of Children: N/A  . Years of Education: N/A   Occupational History  . Not on file.   Social History Main Topics  . Smoking status: Former Smoker    Quit date: 06/14/2006  . Smokeless tobacco: Not on file  . Alcohol Use: No  . Drug Use: No  . Sexually Active: Not on file   Other Topics Concern  .  Not on file   Social History Narrative   DivorcedAfter stroke was living with sister and mother after sister asked her to leave.Difficulties over control.Living by self now. tranportaion difficult son  helpingEx-smokerCurrently living in  An appt. alone son lives close by and helps with the medication in the pill box.Work status regular  before got sick on short-term disabilityCollege graduate ;psychology [employer] graduated May 2011Has children    ROS: Please see the HPI.  All other systems reviewed and negative.  PHYSICAL EXAM:  BP 116/86  Pulse 66  Ht 5\' 4"  (1.626 m)  Wt 174 lb 1.9 oz (78.98 kg)  BMI 29.89 kg/m2  General: Well developed, well nourished, in no acute distress. Head:  Normocephalic and atraumatic. Neck: no JVD Lungs: Clear to auscultation and percussion. Heart: Normal S1 and S2.  No murmur, rubs or gallops.  Abdomen:  Normal bowel sounds; soft; non tender; no organomegaly Pulses: Pulses normal in all 4 extremities. Extremities: No clubbing or cyanosis. No edema. Neurologic: Alert and oriented x 3.  Still using cane, but more mobile.   EKG:    ASSESSMENT AND PLAN:

## 2011-05-22 NOTE — Assessment & Plan Note (Signed)
No current symptoms

## 2011-05-23 ENCOUNTER — Inpatient Hospital Stay (HOSPITAL_COMMUNITY): Payer: PRIVATE HEALTH INSURANCE

## 2011-05-23 ENCOUNTER — Inpatient Hospital Stay (HOSPITAL_COMMUNITY)
Admission: AD | Admit: 2011-05-23 | Discharge: 2011-05-26 | DRG: 641 | Disposition: A | Discharge status: Home Health | Payer: PRIVATE HEALTH INSURANCE | Source: Ambulatory Visit | Attending: Family Medicine | Admitting: Family Medicine

## 2011-05-23 DIAGNOSIS — R339 Retention of urine, unspecified: Secondary | ICD-10-CM | POA: Diagnosis not present

## 2011-05-23 DIAGNOSIS — I498 Other specified cardiac arrhythmias: Secondary | ICD-10-CM

## 2011-05-23 DIAGNOSIS — R404 Transient alteration of awareness: Secondary | ICD-10-CM | POA: Diagnosis present

## 2011-05-23 DIAGNOSIS — Z951 Presence of aortocoronary bypass graft: Secondary | ICD-10-CM

## 2011-05-23 DIAGNOSIS — F329 Major depressive disorder, single episode, unspecified: Secondary | ICD-10-CM | POA: Diagnosis present

## 2011-05-23 DIAGNOSIS — I2510 Atherosclerotic heart disease of native coronary artery without angina pectoris: Secondary | ICD-10-CM | POA: Diagnosis present

## 2011-05-23 DIAGNOSIS — Z7982 Long term (current) use of aspirin: Secondary | ICD-10-CM

## 2011-05-23 DIAGNOSIS — E785 Hyperlipidemia, unspecified: Secondary | ICD-10-CM | POA: Diagnosis present

## 2011-05-23 DIAGNOSIS — R630 Anorexia: Secondary | ICD-10-CM | POA: Diagnosis present

## 2011-05-23 DIAGNOSIS — G4733 Obstructive sleep apnea (adult) (pediatric): Secondary | ICD-10-CM | POA: Diagnosis present

## 2011-05-23 DIAGNOSIS — I69991 Dysphagia following unspecified cerebrovascular disease: Secondary | ICD-10-CM

## 2011-05-23 DIAGNOSIS — K5900 Constipation, unspecified: Secondary | ICD-10-CM | POA: Diagnosis present

## 2011-05-23 DIAGNOSIS — L259 Unspecified contact dermatitis, unspecified cause: Secondary | ICD-10-CM | POA: Diagnosis present

## 2011-05-23 DIAGNOSIS — E876 Hypokalemia: Principal | ICD-10-CM | POA: Diagnosis present

## 2011-05-23 DIAGNOSIS — E669 Obesity, unspecified: Secondary | ICD-10-CM | POA: Diagnosis present

## 2011-05-23 DIAGNOSIS — E039 Hypothyroidism, unspecified: Secondary | ICD-10-CM | POA: Diagnosis present

## 2011-05-23 LAB — POTASSIUM: Potassium: 2.5 mEq/L — CL (ref 3.5–5.1)

## 2011-05-23 LAB — CBC
HCT: 29.8 % — ABNORMAL LOW (ref 36.0–46.0)
Hemoglobin: 10.9 g/dL — ABNORMAL LOW (ref 12.0–15.0)
MCHC: 36.6 g/dL — ABNORMAL HIGH (ref 30.0–36.0)
RBC: 3.7 MIL/uL — ABNORMAL LOW (ref 3.87–5.11)

## 2011-05-23 LAB — GLUCOSE, CAPILLARY: Glucose-Capillary: 99 mg/dL (ref 70–99)

## 2011-05-23 LAB — BASIC METABOLIC PANEL
BUN: 13 mg/dL (ref 6–23)
Chloride: 103 mEq/L (ref 96–112)
GFR calc Af Amer: >60 mL/min (ref >60)
GFR calc non Af Amer: >60 mL/min (ref >60)
Glucose, Bld: 88 mg/dL (ref 70–99)
Potassium: 2.4 mEq/L — CL (ref 3.5–5.1)
Sodium: 139 mEq/L (ref 135–145)

## 2011-05-23 LAB — MAGNESIUM: Magnesium: 1.4 mg/dL — ABNORMAL LOW (ref 1.5–2.5)

## 2011-05-24 LAB — CBC
Hemoglobin: 10.7 g/dL — ABNORMAL LOW (ref 12.0–15.0)
MCH: 29.9 pg (ref 26.0–34.0)
Platelets: 196 10*3/uL (ref 150–400)
RBC: 3.58 MIL/uL — ABNORMAL LOW (ref 3.87–5.11)
WBC: 3.9 10*3/uL — ABNORMAL LOW (ref 4.0–10.5)

## 2011-05-24 LAB — PREALBUMIN: Prealbumin: 13.7 mg/dL — ABNORMAL LOW (ref 17.0–34.0)

## 2011-05-24 LAB — MAGNESIUM: Magnesium: 1.8 mg/dL (ref 1.5–2.5)

## 2011-05-25 LAB — BASIC METABOLIC PANEL
CO2: 28 mEq/L (ref 19–32)
CO2: 28 mEq/L (ref 19–32)
Calcium: 8.7 mg/dL (ref 8.4–10.5)
Chloride: 104 mEq/L (ref 96–112)
Chloride: 107 mEq/L (ref 96–112)
Glucose, Bld: 92 mg/dL (ref 70–99)
Glucose, Bld: 107 mg/dL — ABNORMAL HIGH (ref 70–99)
Potassium: 2.6 mEq/L — CL (ref 3.5–5.1)
Potassium: 4.6 mEq/L (ref 3.5–5.1)
Sodium: 141 mEq/L (ref 135–145)
Sodium: 140 mEq/L (ref 135–145)

## 2011-05-25 LAB — MAGNESIUM: Magnesium: 1.7 mg/dL (ref 1.5–2.5)

## 2011-05-26 NOTE — Assessment & Plan Note (Signed)
She had what we thought was likely some diastolic dysfunction.  She has OSA, and does use CPAP regularly, but it has been a bit spotty.  Her SOB was better with diuretics, but she had a stroke, and also had hypokalemia and hypomagnesemia.  She also had a bit of a watershed when she got volume contracted and hypotensive, so it is a mixed bag with regarding to using diuretics for her.  Probably better not to use at this point until the issues with electrolyte disturbances get resolved.

## 2011-05-26 NOTE — Consult Note (Signed)
[name], [name] NO.:  [id number]  MEDICAL RECORD NO.:  [id number]  LOCATION:                                 FACILITY:  PHYSICIAN:  Natalia Leatherwood, MD    DATE OF BIRTH:  01/05/1956  DATE OF CONSULTATION:  05/26/2011 DATE OF DISCHARGE:                                CONSULTATION   REQUESTING PROVIDER:  Mauro Kaufmann, MD  CHIEF COMPLAINT:  Urinary retention.  HISTORY OF PRESENT ILLNESS:  This is a 55 year old female who was admitted for hypokalemia and hypomagnesemia.  She was found to be having difficulty voiding.  The patient refused catheterization but a straight catheter resulted in 600 mL of urine being drained from her bladder.  The patient has pertinent past medical history of procedure for urinary incontinence.  The patient states she does not remember the name of the physician who performed the procedure but she believes it was one of the urologists in the area.  The patient also has a pertinent past medical history of having a stroke.  Her urinary problems are as follows.  She states that she has had urgency, frequency, and nocturia.  She denies any dysuria or gross hematuria.  She states she does not have a weak stream, but occasionally needs to strain to void.  Today we discussed the risk of urinary retention.  I explained to the patient that retaining urine ultimately damages the kidneys and that is why it is important that we make sure the bladder empties.  We discussed different options and I explained due to her past history with surgery and a stroke that her bladder may be very complicated as far as its function. Unfortunately the patient does not remember the type of surgical procedure she had or the physician who performed it.  I explained to her that she would likely need urodynamics to better assess her bladder function.  I said in the meantime in order to protect her kidneys, I recommended indwelling Foley catheter versus learning  self intermittent catheterization.  We discussed the risks and benefits of each, both of which having urinary tract infection as a possibility with the benefit of preserving renal function.  At this point, the patient is interested in learning self intermittent catheterization.  PAST MEDICAL HISTORY: 1. Sleep apnea. 2. 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 3. Coronary artery disease. 4. Hyperlipidemia. 5. Hypothyroidism. 6. Obesity. 7. Congestive heart failure. 8. Eczema.  PAST SURGICAL HISTORY: 1. Bladder sling. 2. Coronary artery bypass graft. 3. Arthroscopic knee surgery. 4. Mastoidectomy. 5. Tonsils and adenoids.  ALLERGIES: 1. PENICILLIN. 2. CELL PHONE 3. CODEINE. 4. AVELOX.  MEDICATIONS:  Medical reconciliation reviewed.  This is available in the medical record.  SOCIAL HISTORY:  The patient has a significant history of smoking.  She states she has quit approximately 5 years ago.  She denies use of daily alcohol.  FAMILY HISTORY:  Negative for bladder cancer and renal cancer as far she knows.  REVIEW OF SYSTEMS:  Positive for weight loss, emesis, nausea, and constipation.  She denies any chest pain or shortness of breath.  She also denies abdominal pain or flank pain.  She is also positive for fatigue, dysphasia, numbness in her left face, and depression.  Further 10-point review of systems is negative.  PHYSICAL EXAMINATION:  GENERAL:  No acute distress.  Awake and alert. ENT:  Normocephalic.  Atraumatic. NECK:  Supple.  No masses. CARDIOVASCULAR:  Regular rate.  Regular rhythm. LUNGS:  Equal effort bilaterally.  Clear to auscultation bilaterally. ABDOMEN:  Soft.  No masses palpated. EXTREMITIES:  No gross deformity in the bilateral upper extremity.  No gross deformity in bilateral lower extremity. NEUROLOGICAL:  Able to move bilateral upper extremities appropriately. Able to move bilateral lower extremities  appropriately. SKIN:  Good turgor.  No visible rash. GENITOURINARY:  No Foley catheter is in place.  ASSESSMENT:  Urinary retention due to unknown etiology.  PLAN: 1. As discussed in the history of present illness I have recommended     the patient have her urine drained from her bladder in some form.     She has elected for intermittent catheterization.  I have written     an order for this to be taught by the nursing staff and for her to     be discharged home with lubrication and sterile catheters to be     used once daily.  She will be discharged with 3 weeks worth of     catheters. 2. I recommended the patient follow up with a urologist in 2 weeks.     She would prefer to follow up with the urologist who performed her     sling surgery.  I will try to help find out who this is if she     cannot remember and if     I cannot find out, then I will be happy to make an appointment for     her to follow with me. 3. She did give me her cell phone number as [phone].  I will contact     her with an appointment for me or give her an appointment with     another physician.  I also gave her the number of our practice.          ______________________________ Natalia Leatherwood, MD     DW/MEDQ  D:  05/26/2011  T:  05/26/2011  Job:  [id number]  Electronically Signed by Natalia Leatherwood MD on 05/26/2011 09:54:46 PM

## 2011-05-26 NOTE — Assessment & Plan Note (Signed)
She seems to be improving in terms of functional status, but remains incredibly depressed.  She has issues with her sister, and her mother lives with her sister.  She is supported by her son, but the history of depression extends back a long way.  She no longer smokes fortunately, and is well tuned to her disease process, but how this may be contributing to her electrolyte issues is not clear.  I will speak with Dr. Fabian Sharp.

## 2011-05-29 ENCOUNTER — Telehealth: Payer: Self-pay | Admitting: *Deleted

## 2011-05-29 NOTE — Telephone Encounter (Signed)
Needs an order faxed to them for a Child psychotherapist

## 2011-05-30 ENCOUNTER — Ambulatory Visit: Payer: PRIVATE HEALTH INSURANCE | Admitting: Physical Medicine & Rehabilitation

## 2011-05-30 ENCOUNTER — Encounter: Payer: PRIVATE HEALTH INSURANCE | Attending: Physical Medicine & Rehabilitation

## 2011-05-30 DIAGNOSIS — I2510 Atherosclerotic heart disease of native coronary artery without angina pectoris: Secondary | ICD-10-CM | POA: Insufficient documentation

## 2011-05-30 DIAGNOSIS — Z79899 Other long term (current) drug therapy: Secondary | ICD-10-CM | POA: Insufficient documentation

## 2011-05-30 DIAGNOSIS — I69959 Hemiplegia and hemiparesis following unspecified cerebrovascular disease affecting unspecified side: Secondary | ICD-10-CM | POA: Insufficient documentation

## 2011-05-30 DIAGNOSIS — M7520 Bicipital tendinitis, unspecified shoulder: Secondary | ICD-10-CM

## 2011-05-30 DIAGNOSIS — G4733 Obstructive sleep apnea (adult) (pediatric): Secondary | ICD-10-CM | POA: Insufficient documentation

## 2011-05-30 NOTE — Telephone Encounter (Signed)
Order faxed.

## 2011-05-31 ENCOUNTER — Ambulatory Visit: Payer: PRIVATE HEALTH INSURANCE | Admitting: Internal Medicine

## 2011-06-03 ENCOUNTER — Encounter: Payer: Self-pay | Admitting: Internal Medicine

## 2011-06-03 ENCOUNTER — Ambulatory Visit (INDEPENDENT_AMBULATORY_CARE_PROVIDER_SITE_OTHER): Payer: PRIVATE HEALTH INSURANCE | Admitting: Internal Medicine

## 2011-06-03 ENCOUNTER — Telehealth: Payer: Self-pay | Admitting: Internal Medicine

## 2011-06-03 DIAGNOSIS — M62838 Other muscle spasm: Secondary | ICD-10-CM

## 2011-06-03 DIAGNOSIS — I10 Essential (primary) hypertension: Secondary | ICD-10-CM

## 2011-06-03 DIAGNOSIS — E46 Unspecified protein-calorie malnutrition: Secondary | ICD-10-CM

## 2011-06-03 DIAGNOSIS — R339 Retention of urine, unspecified: Secondary | ICD-10-CM

## 2011-06-03 DIAGNOSIS — F4321 Adjustment disorder with depressed mood: Secondary | ICD-10-CM

## 2011-06-03 DIAGNOSIS — Z8673 Personal history of transient ischemic attack (TIA), and cerebral infarction without residual deficits: Secondary | ICD-10-CM

## 2011-06-03 DIAGNOSIS — E876 Hypokalemia: Secondary | ICD-10-CM

## 2011-06-03 NOTE — Progress Notes (Signed)
  Subjective:    Patient ID: [name], female    DOB: [birth year], 55 y.o.   MRN: [id number]  HPI Patient comes in for followup of hospitalization for her low magnesium/ potassium which was replaced by IV therapy.   She is no longer on a diuretic and has no vomiting or diarrhea. She was discharged on September 16. Since that time her muscle cramps are better.due for repeat lab test.  Urinary retention: In hospital she had some difficulty and describes this as"Hesitancy  "  Sometimes cant urinate   Supposed to do post void  In and out cath... out of catheters.    And supposed to follow up with urology.   Went overnight  Didn't void.   Nutrition: Appears to be somewhat insecure and difficulties with her swallowing nutrition consult in hospital. Protein calorie malnutrition ; Supposed to take in Ensure twice a day. To use MiraLax as needed  Depression: Tearful most of the time when she is not asleep she is on 90 mg of Cymbalta. She has no money in the bank and financial distress. She is dependent on others for transportation additionally. She is to see psychiatry soon. She is tearful and pretty hopeless feeling.   Review of Systems No fever cp   Some dyspnea at times no cough leg edema slight  No bleeding no falling  No drooling or other dysphagia  No new Nausea. walks with a  Cane . Has lost weight  Has some teeth abscess no money to work on this.  Glasses  No money to get scrip.  Past history family history social history reviewed in the electronic medical record.     Objective:   Physical Exam WD alert orented depressed appearing in nad  Walks with cane slow motor planning No tremor or rigiditity  Chest:  Clear to A&P without wheezes rales or rhonchi CV:  S1-S2 no gallops or murmurs peripheral perfusion is normal No clubbing cyanosis trc + edema Psych depressed but good eye contact and thought process Skin no bleeding nodule on scalp Hosp summary reviewed       Assessment & Plan:    Fu Hypo mag and HYpokal severe and persistent  Now corrected and seemingly has no reason to relapse in this area. Avoiding diuretics and is not vomiting. 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  To psych for meds  Urinary retention hx     Call uro about the cath supply and plan . / if meds contributing  Hypertension  Continue to monitor  Financial distress SW consult pending  To go  On food stamps   Total visit 40 mins > 50% spent counseling and coordinating care

## 2011-06-03 NOTE — Patient Instructions (Signed)
Will notify you  of labs when available. Continue with depression evaluation and medication management   and call  Urology about catheters.   Plan follow up depending on labs .

## 2011-06-03 NOTE — Telephone Encounter (Signed)
Pt returned call & can be reached at [phone].  [name]

## 2011-06-03 NOTE — Assessment & Plan Note (Signed)
Improved after electrolytes normalized

## 2011-06-03 NOTE — Assessment & Plan Note (Signed)
Significant depression and tearfulness today seems to be her major  difficulty now that her electrolytes are replaced.  She has a planned followup with psychiatry. We also have a social work consult pending to see if there is more help for her severe financial situation. She apparently was denied social security disability . Although she appears significantly disabled to me in regard to her motor executive function/ from the CVA  depression and other medical difficulties.

## 2011-06-03 NOTE — Telephone Encounter (Signed)
PATIENT CALLED BACK AND ADDED THAT SHE NEEDS REFILL OF TEMAZEPAM.  PHARMACY IS [facility] ON [address].  SHE SAYS THAT SHE CANNOT SLEEP WITHOUT IT.

## 2011-06-03 NOTE — Telephone Encounter (Signed)
LMTCB

## 2011-06-04 DIAGNOSIS — F331 Major depressive disorder, recurrent, moderate: Secondary | ICD-10-CM

## 2011-06-04 LAB — MAGNESIUM: Magnesium: 1.6 mg/dL (ref 1.5–2.5)

## 2011-06-04 MED ORDER — TEMAZEPAM 7.5 MG PO CAPS: 7.5000 mg | ORAL_CAPSULE | Freq: Every evening | ORAL | Status: DC | PRN

## 2011-06-04 NOTE — Telephone Encounter (Signed)
Spoke with pt. She is requesting refill on temazepam, completely out of med. She also states having some issues with her CPAP and wanted ov with [facility] to discuss. I have sched her the first available appt for 06/12/11 and pt okay with this. Please advise on temazepam. Thanks!

## 2011-06-04 NOTE — Procedures (Signed)
NAMEHAYLIN, CAMILLI NO.:  [id number]  MEDICAL RECORD NO.:  [id number]           PATIENT TYPE:  LOCATION:                                 FACILITY:  PHYSICIAN:  Erick Colace, M.D.DATE OF BIRTH:  Dec 08, 1955  DATE OF PROCEDURE: DATE OF DISCHARGE:                              OPERATIVE REPORT  PROCEDURE:  Left bicipital tendon injection under ultrasound guidance.  INDICATION:  Left biceps tenosynovitis, status post CVA.  This was demonstrated on musculoskeletal ultrasound.  She has tried Voltaren gel.  She is not a good candidate for nonsteroidals due to recent stroke.  Informed consent was obtained after describing risks and benefits of the procedure with the patient.  These include bleeding, bruising and infection.  She elects to proceed and has given written consent.  The patient placed prone on fluoroscopy table.  Betadine prep, sterile drape, 25-gauge inch and half needle was used to anesthetize skin and subcu tissue 1% lidocaine x2 mL.  Then a, 22-gauge, 40-mm echo block needle was inserted under ultrasound guidance with direct real time visualization of the bicipital tendon sheath.  Needle easily entered this tendon sheath and after negative drawback for blood 1 mL of 40 mg/mL Depo-Medrol and 4 mL of 1% lidocaine were injected with observed distention of the bicipital tendon sheath both anterior and posterior to the tendon.  The patient tolerated the procedure well.  Postprocedure instructions given.     Erick Colace, M.D. Electronically Signed    AEK/MEDQ  D:  05/30/2011 11:40:26  T:  05/30/2011 11:52:30  Job:  [id number]

## 2011-06-04 NOTE — Telephone Encounter (Signed)
Per CY-okay to refill this time;keep appt for 06-12-11, get new RX then.

## 2011-06-04 NOTE — Telephone Encounter (Signed)
Pt aware rx was called [facility] and needs to keep OV for new rx

## 2011-06-05 LAB — BASIC METABOLIC PANEL
CO2: 26 mEq/L (ref 19–32)
Chloride: 103 mEq/L (ref 96–112)
Creatinine, Ser: 0.9 mg/dL (ref 0.4–1.2)
Sodium: 141 mEq/L (ref 135–145)

## 2011-06-05 NOTE — Progress Notes (Signed)
Addended by: Rita Ohara R on: 06/05/2011 04:02 PM   Modules accepted: Orders

## 2011-06-06 ENCOUNTER — Telehealth: Payer: Self-pay | Admitting: *Deleted

## 2011-06-06 NOTE — Telephone Encounter (Signed)
Pt aware of results. Orders placed in epic. 

## 2011-06-06 NOTE — Telephone Encounter (Signed)
Message copied by Romualdo Bolk on Thu Jun 06, 2011 11:22 AM ------      Message from: [facility] [place] [facility]      Created: Wed Jun 05, 2011 12:16 PM       Tell patient mg is  Low normal now   .      (i do not see the bmp in the system please call and see where this is  So we can tell her result. )            If normal   BMP, then ROV in about 2 months or if needed before then.

## 2011-06-10 ENCOUNTER — Other Ambulatory Visit (HOSPITAL_COMMUNITY): Payer: Self-pay | Admitting: Urology

## 2011-06-11 ENCOUNTER — Encounter: Payer: PRIVATE HEALTH INSURANCE | Admitting: Physician Assistant

## 2011-06-12 ENCOUNTER — Ambulatory Visit (INDEPENDENT_AMBULATORY_CARE_PROVIDER_SITE_OTHER): Payer: PRIVATE HEALTH INSURANCE | Admitting: Internal Medicine

## 2011-06-12 ENCOUNTER — Ambulatory Visit (INDEPENDENT_AMBULATORY_CARE_PROVIDER_SITE_OTHER): Payer: PRIVATE HEALTH INSURANCE | Admitting: Physician Assistant

## 2011-06-12 ENCOUNTER — Ambulatory Visit (HOSPITAL_COMMUNITY)
Admission: RE | Admit: 2011-06-12 | Discharge: 2011-06-12 | Disposition: A | Discharge status: Home / Self Care | Payer: PRIVATE HEALTH INSURANCE | Source: Ambulatory Visit | Attending: Urology | Admitting: Urology

## 2011-06-12 ENCOUNTER — Encounter: Payer: Self-pay | Admitting: Internal Medicine

## 2011-06-12 ENCOUNTER — Encounter: Payer: Self-pay | Admitting: Physician Assistant

## 2011-06-12 VITALS — BP 140/92 | HR 92 | Ht 64.0 in | Wt 176.0 lb

## 2011-06-12 DIAGNOSIS — R0602 Shortness of breath: Secondary | ICD-10-CM

## 2011-06-12 DIAGNOSIS — I10 Essential (primary) hypertension: Secondary | ICD-10-CM

## 2011-06-12 DIAGNOSIS — E876 Hypokalemia: Secondary | ICD-10-CM

## 2011-06-12 DIAGNOSIS — G4733 Obstructive sleep apnea (adult) (pediatric): Secondary | ICD-10-CM

## 2011-06-12 DIAGNOSIS — E279 Disorder of adrenal gland, unspecified: Secondary | ICD-10-CM | POA: Insufficient documentation

## 2011-06-12 DIAGNOSIS — R002 Palpitations: Secondary | ICD-10-CM

## 2011-06-12 DIAGNOSIS — K8020 Calculus of gallbladder without cholecystitis without obstruction: Secondary | ICD-10-CM | POA: Insufficient documentation

## 2011-06-12 DIAGNOSIS — I2510 Atherosclerotic heart disease of native coronary artery without angina pectoris: Secondary | ICD-10-CM

## 2011-06-12 MED ORDER — GADOBENATE DIMEGLUMINE 529 MG/ML IV SOLN
16.0000 mL | Freq: Once | INTRAVENOUS | Status: AC | PRN
  Administered 2011-06-12: 16 mL via INTRAVENOUS

## 2011-06-12 NOTE — Progress Notes (Signed)
HPI- 06/12/11- 55 year old female former smoker followed for obstructive sleep apnea complicated by dyspnea, depression, hypothyroid, CAD/CABG, CVA. PCP Dr Fabian Sharp Last here 03/02/2010. Has had flu vaccine for this year. Since last here she reports having had a stroke with weakness left side and some confusion. She spent time with rehabilitation and PT. Her sense of smell has been affected which has reduced her appetite and lead to weight loss. Hospitalized twice for abnormal electrolytes. Her weight is now down about 60 pounds. Since her stroke, Nuvigil no longer has much affect on the alertness and she feels sleepy throughout the day. CPAP remains at 14 but she is having difficulty with it, blaming nasal congestion. She had to change to a full face mask but it leaks too much.  ROS Constitutional:   +  weight loss, no- night sweats, fevers, chills,+ fatigue, lassitude. HEENT:   No-  headaches, difficulty swallowing, tooth/dental problems, sore throat,       No-  sneezing, itching, ear ache, nasal congestion, post nasal drip,  CV:  No-   chest pain, orthopnea, PND, swelling in lower extremities, anasarca, dizziness, palpitations Resp: +  shortness of breath with exertion or at rest.              No-   productive cough,  No non-productive cough,  No-  coughing up of blood.              No-   change in color of mucus.  No- wheezing.   Skin: No-   rash or lesions. GI:  No-   heartburn, indigestion, abdominal pain, nausea, vomiting, diarrhea,                 change in bowel habits, loss of appetite GU: No-   dysuria, change in color of urine, no urgency or frequency.  No- flank pain. MS:  No-   joint pain or swelling.  No- decreased range of motion.  No- back pain. Neuro- grossly normal to observation, Or:  Psych:  No- change in mood or affect. + depression or anxiety.  No memory loss.   OBJ- General- Alert, Oriented, Affect-appropriate, Distress- none acute Skin- rash-none, lesions- none,  excoriation- none Lymphadenopathy- none Head- atraumatic            Eyes- Gross vision intact, PERRLA, conjunctivae clear secretions            Ears- Hearing, canals-normal            Nose- Clear, no-Septal dev, mucus, polyps, erosion, perforation             Throat- Mallampati III , mucosa clear , drainage- none, tonsils- atrophic Neck- flexible , trachea midline, no stridor , thyroid nl, carotid no bruit Chest - symmetrical excursion , unlabored           Heart/CV- RRR , no murmur , no gallop  , no rub, nl s1 s2                           - JVD- none , edema- none, stasis changes- none, varices- none           Lung- faint squeaks, wheeze- none, cough- none , dullness-none, rub- none           Chest wall- sternotomy scar Abd- tender-no, distended-no, bowel sounds-present, HSM- no Br/ Gen/ Rectal- Not done, not indicated Extrem- cyanosis- none, clubbing, none, atrophy- none, strength- nl Neuro- she is alert  and fully oriented. Not moving her left arm much. Droop left lower face, left palate does not rise, speech is clear.

## 2011-06-12 NOTE — Assessment & Plan Note (Signed)
Stable.  No chest pain.  Continue aspirin and Plavix and statin.

## 2011-06-12 NOTE — Assessment & Plan Note (Signed)
Increased recently.  However, her weight is stable.  Her volume appears stable on exam.  Diuretics have been problematic in the past due to electrolyte abnormalities.  I would hold off on adding Lasix unless we have objective evidence of congestive heart failure.  Check a basic metabolic panel today to followup on her potassium and a BNP.  She has had a cough recently.  I will also check a chest x-ray.  She has followup with Dr. Riley Kill in November and she can keep that appointment.

## 2011-06-12 NOTE — Assessment & Plan Note (Signed)
Recent lab work demonstrated low normal potassium and magnesium.  Repeat basic metabolic panel and magnesium today.

## 2011-06-12 NOTE — Patient Instructions (Addendum)
Order- [facility]- autotitrate CPAP x 2 weeks, 5-15 cwp, for pressure recommendation. Dx OSA  Try otc nasal saline spray - use as much as you need for dryness in your nose.

## 2011-06-12 NOTE — Progress Notes (Signed)
History of Present Illness: Primary Cardiologist:  Dr.  Shawnie Pons  IRAIS MOTTRAM is a 55 y.o. female for post hospital follow up.  She has a h/o CAD, s/p CABG, stable anatomy by cath in 09/2010, recent h/o dyspnea thought to be related to diastolic dysfunction after right heart cath and pulmonary nodules monitored by CT scans and right MCA CVA in 2/12.   Other hx includes HTN, HLP, hypothyroidism, sleep apnea, COPD and bradycardia prompting d/c of beta blocker in 6/12.  Echocardiogram 6/12: Mild LVH, EF 60-65%, mild LAE.  She was admitted 9/13-9/16 with severe hypokalemia and hypomagnesemia.  Low magnesium thought to be due to poor po intake and this resulted in low K+.  This was replaced and she was seen by nutrition.  Follow up labs post d/c 9/24: K 3.5, creatinine 0.9, Mg 1.6.    Just had cymbalta changed to seroquel.  Feels tired with this.  Also getting CPAP titrated.  Notes increased DOE this week.  No orthopnea or PND.  Does have some edema.  No significant weight changes since last visit.  Eating ok.  Trying to use supplements.  No chest pain.  No syncope.  Still has palpitations.  Note, holter in 5/12 was negative for AFib.    Past Medical History  Diagnosis Date  . CAD (coronary artery disease)     cath 09/2010: LAD stent patent, S-Int/dCFX ok, S-PDA ok, L-LAD atretic  . Hypertension   . Hx of CABG   . Diastolic dysfunction     TEE 11/2010: EF 60-65%, BAE, trivial atrial septal shunt;  right heart cath in 10/2010 with elevated R and L heart pressures and diuretic started  . HLD (hyperlipidemia)   . Hypothyroidism   . Sleep apnea   . COPD (chronic obstructive pulmonary disease)   . Pulmonary nodules     repeat CT due in 11/2011  . Acute right MCA stroke 11/07/10  . Hx MRSA infection     Chest wall syndrome post CABG    Current Outpatient Prescriptions  Medication Sig Dispense Refill  . amLODipine (NORVASC) 5 MG tablet Take 1 tablet (5 mg total) by mouth daily.  30 tablet  9  .  Armodafinil (NUVIGIL) 150 MG tablet Take 1 tablet (150 mg total) by mouth daily.  30 tablet  2  . aspirin 81 MG tablet Take 81 mg by mouth daily.        . clopidogrel (PLAVIX) 75 MG tablet Take 1 tablet (75 mg total) by mouth daily.  30 tablet  6  . diclofenac sodium (VOLTAREN) 1 % GEL Apply 1 application topically 4 (four) times daily.        . DULoxetine (CYMBALTA) 30 MG capsule Take 30 mg by mouth daily.        . fluocinonide (LIDEX) 0.05 % ointment Apply topically 2 (two) times daily.  30 g  0  . levothyroxine (SYNTHROID, LEVOTHROID) 137 MCG tablet Take 137 mcg by mouth daily.  30 tablet  2  . pantoprazole (PROTONIX) 40 MG tablet Take 1 tablet (40 mg total) by mouth daily.  30 tablet  12  . QUEtiapine (SEROQUEL XR) 200 MG 24 hr tablet 200 mg at bedtime.        . rosuvastatin (CRESTOR) 5 MG tablet Take 1 tablet (5 mg total) by mouth daily.  30 tablet  9  . senna (SENOKOT) 8.6 MG tablet Take 1 tablet by mouth daily.        . temazepam (  RESTORIL) 7.5 MG capsule Take 1 capsule (7.5 mg total) by mouth at bedtime as needed.  30 capsule  0  . tizanidine (ZANAFLEX) 2 MG capsule Take 2 mg by mouth 2 (two) times daily.       . traMADol (ULTRAM) 50 MG tablet Take 50 mg by mouth as needed.          Allergies  Allergen Reactions  . Amoxicillin     REACTION: unspecified  . Avelox (Moxifloxacin Hcl In Nacl)   . Hydrocodone     REACTION: itching  . Penicillins     REACTION: hives  . Sulfonamide Derivatives     REACTION: ?   ROS:  Please see the history of present illness.  ? Fever recently.  Notes a cough that is nonproductive.  All other systems reviewed and negative.  Vital Signs: BP 142/88  Pulse 82  Ht 5\' 4"  (1.626 m)  Wt 174 lb 6.4 oz (79.107 kg)  BMI 29.94 kg/m2  PHYSICAL EXAM: Well nourished, well developed, in no acute distress HEENT: normal  Neck: no JVD at 45 degrees Cardiac:  normal S1, S2; RRR; no murmur Lungs:  clear to auscultation bilaterally, no wheezing, rhonchi or  rales Abd: soft, nontender Ext: very trace bilateral edema Skin: warm and dry Neuro:  Alert and orient x 3 Psych: flat affect  EKG:  Sinus rhythm, heart rate 82, normal axis, NSSTTW changes; no significant change in compared to tracings  ASSESSMENT AND PLAN:

## 2011-06-12 NOTE — Patient Instructions (Signed)
Your physician recommends that you return for lab work in: bmet, magnesium, bnp 786.05 sob, 414.01 cad  A chest x-ray DX 786.05 takes a picture of the organs and structures inside the chest, including the heart, lungs, and blood vessels. This test can show several things, including, whether the heart is enlarges; whether fluid is building up in the lungs; and whether pacemaker / defibrillator leads are still in place.

## 2011-06-12 NOTE — Assessment & Plan Note (Signed)
Of note, Holter monitor in May negative for atrial fibrillation.

## 2011-06-12 NOTE — Assessment & Plan Note (Signed)
Somewhat elevated.  Continue to monitor.  If this continues to be elevated, her amlodipine will need to be adjusted.

## 2011-06-13 ENCOUNTER — Ambulatory Visit (HOSPITAL_COMMUNITY)
Admission: RE | Admit: 2011-06-13 | Discharge: 2011-06-13 | Disposition: A | Discharge status: Home / Self Care | Payer: PRIVATE HEALTH INSURANCE | Source: Ambulatory Visit | Attending: Physician Assistant | Admitting: Physician Assistant

## 2011-06-13 DIAGNOSIS — R5381 Other malaise: Secondary | ICD-10-CM | POA: Insufficient documentation

## 2011-06-13 DIAGNOSIS — R0602 Shortness of breath: Secondary | ICD-10-CM | POA: Insufficient documentation

## 2011-06-13 DIAGNOSIS — I2510 Atherosclerotic heart disease of native coronary artery without angina pectoris: Secondary | ICD-10-CM

## 2011-06-13 LAB — BRAIN NATRIURETIC PEPTIDE: Pro B Natriuretic peptide (BNP): 46 pg/mL (ref 0.0–100.0)

## 2011-06-14 ENCOUNTER — Other Ambulatory Visit: Payer: PRIVATE HEALTH INSURANCE | Admitting: *Deleted

## 2011-06-14 ENCOUNTER — Other Ambulatory Visit: Payer: Self-pay | Admitting: *Deleted

## 2011-06-14 ENCOUNTER — Other Ambulatory Visit: Payer: Self-pay | Admitting: Physician Assistant

## 2011-06-14 DIAGNOSIS — I2510 Atherosclerotic heart disease of native coronary artery without angina pectoris: Secondary | ICD-10-CM

## 2011-06-14 LAB — BASIC METABOLIC PANEL
CO2: 29 mEq/L (ref 19–32)
Calcium: 8.3 mg/dL — ABNORMAL LOW (ref 8.4–10.5)
GFR: 95.95 mL/min (ref >60.00)
Glucose, Bld: 85 mg/dL (ref 70–99)
Potassium: 2.8 mEq/L — CL (ref 3.5–5.1)
Sodium: 138 mEq/L (ref 135–145)

## 2011-06-14 MED ORDER — MAGNESIUM OXIDE 400 MG PO TABS: 400.0000 mg | ORAL_TABLET | Freq: Two times a day (BID) | ORAL | Status: DC

## 2011-06-14 MED ORDER — POTASSIUM CHLORIDE 20 MEQ/15ML (10%) PO LIQD: 40.0000 meq | Freq: Every day | ORAL | Status: DC

## 2011-06-15 NOTE — Assessment & Plan Note (Signed)
I am not impressed by nasal congestion on exam today but it seems to be a focus of concern for her during the night and may be  something positional. We will auto titrate her CPAP for pressure check. She has had significant weight loss and hopefully that will reduce her pressure requirement.

## 2011-06-17 ENCOUNTER — Telehealth: Payer: Self-pay | Admitting: *Deleted

## 2011-06-17 ENCOUNTER — Other Ambulatory Visit (INDEPENDENT_AMBULATORY_CARE_PROVIDER_SITE_OTHER): Payer: PRIVATE HEALTH INSURANCE | Admitting: *Deleted

## 2011-06-17 DIAGNOSIS — I10 Essential (primary) hypertension: Secondary | ICD-10-CM

## 2011-06-17 DIAGNOSIS — I2510 Atherosclerotic heart disease of native coronary artery without angina pectoris: Secondary | ICD-10-CM

## 2011-06-17 DIAGNOSIS — E876 Hypokalemia: Secondary | ICD-10-CM

## 2011-06-17 LAB — BASIC METABOLIC PANEL
CO2: 32 mEq/L (ref 19–32)
Calcium: 7.9 mg/dL — ABNORMAL LOW (ref 8.4–10.5)
GFR: 85.69 mL/min (ref >60.00)
Glucose, Bld: 80 mg/dL (ref 70–99)
Potassium: 2.4 mEq/L — CL (ref 3.5–5.1)
Sodium: 141 mEq/L (ref 135–145)

## 2011-06-17 LAB — MAGNESIUM: Magnesium: 1.1 mg/dL — ABNORMAL LOW (ref 1.5–2.5)

## 2011-06-17 NOTE — Discharge Summary (Signed)
NAMELATEEFAH, [name] NO.:  [id number]  MEDICAL RECORD NO.:  [id number]  LOCATION:  [facility]                         FACILITY:  [facility]  PHYSICIAN:  Mauro Kaufmann, MD         DATE OF BIRTH:  22-Sep-1955  DATE OF ADMISSION:  05/08/2011 DATE OF DISCHARGE:  05/08/2011                              DISCHARGE SUMMARY   ADMISSION DIAGNOSES: 1. Hypokalemia. 2. Hypomagnesemia. 3. Constipation. 4. Severe weight loss. 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 6. Vomiting. 7. Depression. 8. History of cerebrovascular accident.  DISCHARGE DIAGNOSES: 1. Hypokalemia, resolved. 2. Hypomagnesemia, resolved. 3. Constipation. 4. Questionable urinary retention. 5. Protein-calorie malnutrition. 6. Hyperlipidemia. 7. Hypothyroidism. 8. Depression.  IMAGING STUDIES:  Tests performed in the hospital stay include x-ray of the abdomen on June 09, 2011, showed moderate amount of feces, mainly in the right colon and transverse colon.  No obstruction.  LABORATORY DATA:  Pertinent labs, the patient had white count of 3.9, hemoglobin 10.7, hematocrit 29.1, and platelet count of 196.  Sodium 140, potassium 4.6, chloride is 107, CO2 of 28, BUN 10, creatinine 0.73, glucose 107, and magnesium at this time is 1.7.  On the day of admission, the patient's magnesium was 1.1 with a potassium of 2.6.  The patient's prealbumin is 13.7.  CONSULTATIONS:  Consults obtained in hospice include nutrition consult.  BRIEF HISTORY AND PHYSICAL:  This is a 55 year old female, who came to the hospital with a low potassium and low magnesium, which has been going on for the past 20 weeks and found blood work done by Dr. Rosalyn Charters office.  The patient was told to come to the hospital for replacement of magnesium or potassium.  So, the patient was admitted for IV potassium and magnesium.  BRIEF HOSPITAL COURSE: 1. Hypokalemia.  The patient was given IV potassium 10 mEq  IV KCl,     and also required IV magnesium  sulfate.  After replacement, the     patient's potassium has improved to 4.6.  At this time, I am going     to stop the patient's potassium supplements she was taking at home.     The cause of hypokalemia could be secondary to persistent     hypomagnesemia.  Due to the poor p.o. intake. 2. Hypomagnesemia.  Again, the patient's magnesium has been replaced     and she is encouraged to take p.o. diet.  She also got nutritionist     consult for that.  The patient does not need magnesium     supplementation at this time. 3. As her magnesium is good, she should have magnesium levels checked     in 1 week time at Dr. Riley Kill or Dr. Rosezella Florida office, if she     requires any further supplementation at home. 4. Protein-calorie malnutrition.  The patient was seen by Dr. Noreene Filbert     and Dr. Noreene Filbert has recommended the patient to take Ensure 1 can     p.o. t.i.d., which she should take at home.  The patient also has     been tolerating it very well, so hopefully she will take the Ensure     at home. 5.  Depression.  The patient is currently on Cymbalta 90 mg p.o. daily,     which she will be continuing. 6. Urinary retention.  During the hospitalization, the patient was     found to have hesitancy of urination and the bladder scan showed     517 mL.  Foley catheter was inserted.  At this time, the urine     retention is thought to be a combination of the patient's     medication, which she takes Benadryl as well as Cymbalta, which can     have anticholinergic effects and also severe hypokalemia, which     could have controlled to urinary retention.  We are going to     discontinue the Foley and check the voiding trial.  If the patient     is urinate, she will be going home.  The patient does not want to     go home on Foley catheter, so she wants to have voiding trial.     Unfortunately, the patient's medications are the primary factor     contributing to the urinary retention, which needs to be  changed by     the psychiatrist, which include the Cymbalta and also the patient     should not take Benadryl, which also urinary retention.  Hopefully     by correcting the potassium, the hesitancy/urinary retention should     have improved.  We will check it before discharging the patient. 7. Constipation.  The patient has constipation.  She was started on     MiraLax.  We will see if the patient can get enema as constipation     also can make the urine retention worse.  We will see the patient     if she can get enema before going home.  DISCHARGE MEDICATIONS: 1. Amlodipine 5 mg p.o. daily. 2. Aspirin enteric-coated 81 mg p.o. daily. 3. Benadryl 25 mg 2 tablets by mouth daily as needed. 4. Crestor 5 mg 1 tablet p.o. in the morning. 5. Cymbalta 30 mg 1 capsule every morning. 6. Cymbalta 60 mg 1 capsule every morning. 7. Levothyroxine 137 mcg p.o. daily. 8. Lidex one application topically 3 times a day as needed. 9. Nuvigil 1 tablet p.o. every morning. 10.Pantoprazole 40 mg p.o. every morning. 11.Plavix 75 mg p.o. daily. 12.Senokot over-the-counter 1 tablet p.o. daily. 13.Temazepam 7.5 mg p.o. daily at bedtime. 14.Tizanidine 2 mg 1 tablet p.o. b.i.d. 15.Tramadol 50 mg p.o. b.i.d.     Mauro Kaufmann, MD    GL/MEDQ  D:  05/25/2011  T:  05/25/2011  Job:  [id number]  Electronically Signed by Sibyl Parr Carliss Quast  on 06/17/2011 04:10:54 PM

## 2011-06-17 NOTE — Telephone Encounter (Signed)
Notes Recorded by Jacqlyn Krauss, RN on 06/17/2011 at 5:49 PM I discussed with pt. She verbalized understanding. She states she will return for lab tomorrow morning.-pt knows she should be here by 9am but she is not sure she can make it that early. ------  Notes Recorded by Beatrice Lecher, PA on 06/17/2011 at 5:13 PM Spoke with patient. She admits to feeling tired. I spoke with her PCP as well (Dr. Fabian Sharp). We both agreed the patient should go to the ED. I advised the patient to go to the ED tonight. However, she cannot go tonight. I explained to her that not correcting her K+ and Mg2+ soon can be dangerous. She states she is willing to go to the ED tomorrow morning. Of note, she states she did not get the K+ or MagOxide until yesterday. She took the K+ yesterday, but has not taken yet today. She has taken the MagOxide yesterday and today. Have her take a total of 80 mEq of K+ tonight (she can take 30 mL now and repeat in 4 hours - she has elixir with dose of 20 mEq/53mL). Take 2 extra MagOxide tablets tonight. Repeat stat BMET and Magnesium in the morning - she needs to come in by 9am. If numbers unchanged, she will need to go to the ED. I will cc this to Dr. Fabian Sharp as well to keep her informed. Tereso Newcomer, PA-C

## 2011-06-17 NOTE — Discharge Summary (Signed)
  NAMETHEDA, PAYER NO.:  [id number]  MEDICAL RECORD NO.:  [id number]  LOCATION:                                 FACILITY:  PHYSICIAN:  Mauro Kaufmann, MD         DATE OF BIRTH:  03-05-1956  DATE OF ADMISSION: DATE OF DISCHARGE:                              DISCHARGE SUMMARY   ADDENDUM  The patient's discharge was held yesterday because of the urinary retention.  A Urology consultation was obtained and at this time, the patient wants to do straight cath which she will be taught before she is discharged and also, she will have a home health nurse come to her house to help her with the straight cath.  The patient will follow up with the Urology as outpatient.  Dr. Margarita Grizzle saw this patient in the hospital. Also, the patient is not being sent on supplements at this time because her magnesium level is 1.7 and potassium was 4.6 as of May 25, 2011.  The patient should have a followup appointment with Dr. Fabian Sharp and Dr. Riley Kill in 1 week and get labs checked.  If the patient still has low magnesium and potassium, then she has to be on supplements.  At this time, we recommend the patient continue taking her Ensure as advised by the nutritionist and also the patient will be sent home on MiraLax for 7 days for the constipation as well as Senokot tablets.  We will give milk of magnesia before the patient is discharged.  The patient is stable for discharge home.  Please send a copy of the discharge summary which was done earlier on May 25, 2011, as well as the addendum to Dr. Riley Kill, Cardiology.  The job number for the earlier done discharge summary is [id number].  Please send that summary also to Dr. Fabian Sharp.  On the day of discharge, the patient's vitals were stable.  Blood pressure 109/82, respirations 16, pulse 56, temperature 97.8, O2 sat 98% on room air.     Mauro Kaufmann, MD     GL/MEDQ  D:  05/26/2011  T:  05/26/2011  Job:  [id number]  cc:   Arturo Morton. Riley Kill, MD, [facility] Neta Mends. Fabian Sharp, MD  Electronically Signed by Mauro Kaufmann  on 06/17/2011 04:11:06 PM

## 2011-06-18 ENCOUNTER — Inpatient Hospital Stay (HOSPITAL_COMMUNITY)
Admission: EM | Admit: 2011-06-18 | Discharge: 2011-06-20 | DRG: 641 | Disposition: A | Discharge status: Home / Self Care | Payer: PRIVATE HEALTH INSURANCE | Source: Ambulatory Visit | Attending: Internal Medicine | Admitting: Internal Medicine

## 2011-06-18 ENCOUNTER — Telehealth: Payer: Self-pay | Admitting: Physician Assistant

## 2011-06-18 ENCOUNTER — Other Ambulatory Visit (INDEPENDENT_AMBULATORY_CARE_PROVIDER_SITE_OTHER): Payer: PRIVATE HEALTH INSURANCE | Admitting: *Deleted

## 2011-06-18 DIAGNOSIS — Z951 Presence of aortocoronary bypass graft: Secondary | ICD-10-CM

## 2011-06-18 DIAGNOSIS — R071 Chest pain on breathing: Secondary | ICD-10-CM | POA: Diagnosis present

## 2011-06-18 DIAGNOSIS — E669 Obesity, unspecified: Secondary | ICD-10-CM | POA: Diagnosis present

## 2011-06-18 DIAGNOSIS — E279 Disorder of adrenal gland, unspecified: Secondary | ICD-10-CM | POA: Diagnosis present

## 2011-06-18 DIAGNOSIS — D649 Anemia, unspecified: Secondary | ICD-10-CM | POA: Diagnosis present

## 2011-06-18 DIAGNOSIS — F331 Major depressive disorder, recurrent, moderate: Secondary | ICD-10-CM

## 2011-06-18 DIAGNOSIS — E785 Hyperlipidemia, unspecified: Secondary | ICD-10-CM | POA: Diagnosis present

## 2011-06-18 DIAGNOSIS — I2510 Atherosclerotic heart disease of native coronary artery without angina pectoris: Secondary | ICD-10-CM | POA: Diagnosis present

## 2011-06-18 DIAGNOSIS — E039 Hypothyroidism, unspecified: Secondary | ICD-10-CM | POA: Diagnosis present

## 2011-06-18 DIAGNOSIS — Z7902 Long term (current) use of antithrombotics/antiplatelets: Secondary | ICD-10-CM

## 2011-06-18 DIAGNOSIS — I10 Essential (primary) hypertension: Secondary | ICD-10-CM

## 2011-06-18 DIAGNOSIS — Z8673 Personal history of transient ischemic attack (TIA), and cerebral infarction without residual deficits: Secondary | ICD-10-CM

## 2011-06-18 DIAGNOSIS — E876 Hypokalemia: Secondary | ICD-10-CM

## 2011-06-18 DIAGNOSIS — G4733 Obstructive sleep apnea (adult) (pediatric): Secondary | ICD-10-CM | POA: Diagnosis present

## 2011-06-18 DIAGNOSIS — Z79899 Other long term (current) drug therapy: Secondary | ICD-10-CM

## 2011-06-18 DIAGNOSIS — Z9861 Coronary angioplasty status: Secondary | ICD-10-CM

## 2011-06-18 LAB — POCT I-STAT, CHEM 8
BUN: 12 mg/dL (ref 6–23)
Calcium, Ion: 1.05 mmol/L — ABNORMAL LOW (ref 1.12–1.32)
Creatinine, Ser: 1 mg/dL (ref 0.50–1.10)
Glucose, Bld: 87 mg/dL (ref 70–99)
Hemoglobin: 9.9 g/dL — ABNORMAL LOW (ref 12.0–15.0)
Sodium: 140 mEq/L (ref 135–145)
TCO2: 27 mmol/L (ref 0–100)

## 2011-06-18 LAB — BASIC METABOLIC PANEL
BUN: 11 mg/dL (ref 6–23)
Chloride: 102 mEq/L (ref 96–112)
Creatinine, Ser: 0.9 mg/dL (ref 0.4–1.2)
Glucose, Bld: 92 mg/dL (ref 70–99)
Potassium: 2.8 mEq/L — CL (ref 3.5–5.1)

## 2011-06-18 NOTE — Telephone Encounter (Signed)
K+ and Magnesium too low. Discussed with Dr. Riley Kill. Please call patient and instruct her to go to the ED. I have called the Triage RN at [facility]. Thanks Tereso Newcomer, PA-C

## 2011-06-18 NOTE — Telephone Encounter (Signed)
I spoke with the patient and made her aware that she should go to the ER immediately due to low potassium and magnesium.  Pt agreed with plan.

## 2011-06-19 ENCOUNTER — Emergency Department (HOSPITAL_COMMUNITY): Payer: PRIVATE HEALTH INSURANCE

## 2011-06-19 ENCOUNTER — Observation Stay (HOSPITAL_COMMUNITY): Payer: PRIVATE HEALTH INSURANCE

## 2011-06-19 LAB — CARDIAC PANEL(CRET KIN+CKTOT+MB+TROPI)
CK, MB: 2.2 ng/mL (ref 0.3–4.0)
CK, MB: 2 ng/mL (ref 0.3–4.0)
Relative Index: INVALID (ref 0.0–2.5)
Total CK: 42 U/L (ref 7–177)
Troponin I: <0.3 ng/mL (ref <0.30)
Troponin I: <0.3 ng/mL

## 2011-06-19 LAB — CBC
HCT: 29.7 % — ABNORMAL LOW (ref 36.0–46.0)
MCH: 30.1 pg (ref 26.0–34.0)
MCV: 84.4 fL (ref 78.0–100.0)
Platelets: 230 10*3/uL (ref 150–400)
RDW: 13.5 % (ref 11.5–15.5)
WBC: 4.6 10*3/uL (ref 4.0–10.5)

## 2011-06-19 LAB — BASIC METABOLIC PANEL
Chloride: 104 mEq/L (ref 96–112)
GFR calc Af Amer: >90 mL/min (ref >90)
Potassium: 3.5 mEq/L (ref 3.5–5.1)

## 2011-06-19 LAB — TSH: TSH: 0.295 u[IU]/mL — ABNORMAL LOW (ref 0.350–4.500)

## 2011-06-19 LAB — CK TOTAL AND CKMB (NOT AT ARMC)
CK, MB: 2.1 ng/mL (ref 0.3–4.0)
Relative Index: INVALID (ref 0.0–2.5)
Total CK: 49 U/L (ref 7–177)

## 2011-06-19 LAB — DIFFERENTIAL
Eosinophils Absolute: 0.1 10*3/uL (ref 0.0–0.7)
Eosinophils Relative: 3 % (ref 0–5)
Lymphocytes Relative: 23 % (ref 12–46)
Lymphs Abs: 1.1 10*3/uL (ref 0.7–4.0)
Monocytes Absolute: 0.6 10*3/uL (ref 0.1–1.0)
Monocytes Relative: 12 % (ref 3–12)

## 2011-06-19 LAB — MAGNESIUM: Magnesium: 1.7 mg/dL (ref 1.5–2.5)

## 2011-06-19 LAB — TROPONIN I: Troponin I: <0.3 ng/mL (ref <0.30)

## 2011-06-20 DIAGNOSIS — I2510 Atherosclerotic heart disease of native coronary artery without angina pectoris: Secondary | ICD-10-CM

## 2011-06-20 DIAGNOSIS — E46 Unspecified protein-calorie malnutrition: Secondary | ICD-10-CM

## 2011-06-20 DIAGNOSIS — Z8673 Personal history of transient ischemic attack (TIA), and cerebral infarction without residual deficits: Secondary | ICD-10-CM

## 2011-06-20 DIAGNOSIS — F339 Major depressive disorder, recurrent, unspecified: Secondary | ICD-10-CM

## 2011-06-20 DIAGNOSIS — R339 Retention of urine, unspecified: Secondary | ICD-10-CM

## 2011-06-20 LAB — BASIC METABOLIC PANEL
BUN: 8 mg/dL (ref 6–23)
Chloride: 108 mEq/L (ref 96–112)
GFR calc Af Amer: >90 mL/min (ref >90)
Glucose, Bld: 90 mg/dL (ref 70–99)
Potassium: 3.7 mEq/L (ref 3.5–5.1)

## 2011-06-20 LAB — CBC
HCT: 30.2 % — ABNORMAL LOW (ref 36.0–46.0)
Hemoglobin: 10.6 g/dL — ABNORMAL LOW (ref 12.0–15.0)
RDW: 13.5 % (ref 11.5–15.5)
WBC: 4.4 10*3/uL (ref 4.0–10.5)

## 2011-06-20 NOTE — Progress Notes (Signed)
Spoke with hospitalist today. To be discharged after repletion iv and po. Aldo pra levels pending because she was noted to have a small adrenal adenoma in the past admissions. As poss cause of her recurrent hypokalemia  With out other obvious cause.   Will get lab this week here with rx and then fu  As scheduled on oct 23.   Consider endo  Consult.  WKP

## 2011-06-24 ENCOUNTER — Other Ambulatory Visit (INDEPENDENT_AMBULATORY_CARE_PROVIDER_SITE_OTHER): Payer: PRIVATE HEALTH INSURANCE

## 2011-06-24 ENCOUNTER — Ambulatory Visit: Payer: PRIVATE HEALTH INSURANCE | Admitting: Physical Medicine & Rehabilitation

## 2011-06-24 ENCOUNTER — Encounter: Payer: PRIVATE HEALTH INSURANCE | Attending: Physical Medicine & Rehabilitation

## 2011-06-24 DIAGNOSIS — D649 Anemia, unspecified: Secondary | ICD-10-CM

## 2011-06-24 DIAGNOSIS — Z79899 Other long term (current) drug therapy: Secondary | ICD-10-CM | POA: Insufficient documentation

## 2011-06-24 DIAGNOSIS — G4733 Obstructive sleep apnea (adult) (pediatric): Secondary | ICD-10-CM | POA: Insufficient documentation

## 2011-06-24 DIAGNOSIS — I2510 Atherosclerotic heart disease of native coronary artery without angina pectoris: Secondary | ICD-10-CM

## 2011-06-24 DIAGNOSIS — E876 Hypokalemia: Secondary | ICD-10-CM

## 2011-06-24 DIAGNOSIS — I69959 Hemiplegia and hemiparesis following unspecified cerebrovascular disease affecting unspecified side: Secondary | ICD-10-CM | POA: Insufficient documentation

## 2011-06-24 LAB — CBC WITH DIFFERENTIAL/PLATELET
Basophils Relative: 0.4 % (ref 0.0–3.0)
Eosinophils Absolute: 0.1 10*3/uL (ref 0.0–0.7)
Eosinophils Relative: 2 % (ref 0.0–5.0)
Hemoglobin: 12.9 g/dL (ref 12.0–15.0)
Lymphocytes Relative: 17.5 % (ref 12.0–46.0)
MCHC: 33.9 g/dL (ref 30.0–36.0)
Monocytes Relative: 7.3 % (ref 3.0–12.0)
Neutro Abs: 5.1 10*3/uL (ref 1.4–7.7)
RBC: 4.19 Mil/uL (ref 3.87–5.11)
WBC: 7 10*3/uL (ref 4.5–10.5)

## 2011-06-24 LAB — BASIC METABOLIC PANEL
CO2: 28 mEq/L (ref 19–32)
Calcium: 9.5 mg/dL (ref 8.4–10.5)
Sodium: 142 mEq/L (ref 135–145)

## 2011-06-24 LAB — MAGNESIUM: Magnesium: 1.8 mg/dL (ref 1.5–2.5)

## 2011-06-24 LAB — POTASSIUM: Potassium: 4 mEq/L (ref 3.5–5.1)

## 2011-06-26 NOTE — Progress Notes (Signed)
Pt aware of results 

## 2011-06-26 NOTE — H&P (Signed)
NAMEJAYDALEE, [name] NO.:  [id number]  MEDICAL RECORD NO.:  [id number]  LOCATION:                                 FACILITY:  PHYSICIAN:  Calvert Cantor, M.D.     DATE OF BIRTH:  1956-05-28  DATE OF ADMISSION:  05/23/2011 DATE OF DISCHARGE:                             HISTORY & PHYSICAL   PRIMARY CARE PHYSICIAN:  Neta Mends. Panosh, MD  CARDIOLOGIST:  Arturo Morton. Riley Kill, MD, [facility]  PRESENTING COMPLAINT:  Sent for abnormal lab.  HISTORY OF PRESENT ILLNESS:  This is a 55 year old female, who is being admitted directly to [facility] for low potassium and low magnesium. The patient states that she had blood work done in Dr. Rosalyn Charters office yesterday and apparently this was found to be of normal and she was told to come to the hospital.  She states that her doctors have been having trouble with her potassium and magnesium for few months now.  The patient was apparently on Lasix from February until early August andthis was discontinued in early August.  However, her potassium and magnesium continue to be low.  The patient states that she was initially having trouble swallowing potassium tablets and this was causing a significant amount of vomiting.  These tablets were switched over to potassium liquid, which then was irritating her stomach and causing continued vomiting.  However, in the past 2 weeks, she has had very limited amount of vomiting and states she has been able to tolerate her potassium.  She states she has been tolerating her magnesium just fine.  The patient had a CVA in February 2012, which according to her records was a left MCA infarct.  She states that since then her depression has gotten worse.  She has had a poor appetite.  She has lost a lot of weight.  She attributes part of her poor appetite to change in the sense of smell ever since her infarct.  She does have trouble swallowing tablets, but does not have trouble swallowing any other food.   In eChart, it appears that she has had multiple swallow studies under fluoroscopy, although the results are not in eChart.  The patient states that her diet has not been changed based on these studies.  She also states that she has not had a bowel movement in the past 3 weeks, which could be because she is barely eating anything and also because she was vomiting.  PAST MEDICAL HISTORY: 1. 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 2. Residual dysphagia from the CVA.  The patient was discharged on a     dysphagia I diet.  No mention of thickened liquids. 3. Obstructive sleep apnea. 4. Episode of bradycardia, hypotension and a 45-minute episode of     slurred speech on February 11, 2011, which was diagnosed as a TIA. 5. Coronary artery disease status post stenting and then a subsequent     CABG. 6. Daytime somnolence. 7. Hyperlipidemia. 8. Hypothyroidism. 9. Obesity. 10.The patient states she has diastolic heart failure, but I have     looked back  at her last 2 echoes and there is no mention of this. 11.Eczema.  PAST SURGICAL HISTORY: 1. CABG. 2. Arthroscopic knee surgeries. 3. Bladder sling. 4. Mastoidectomy. 5. Tonsillectomy and adenoidectomy.  ALLERGIES:  PENICILLIN, SULFA, CODEINE, AVELOX is also mentioned in her allergies, however, the patient is uncertain of this.  MEDICATIONS:  Per med rec, 1. Lidex three times a day as needed on hands. 2. Senna OTC 1 tablet daily. 3. Pantoprazole 40 mg daily. 4. Benadryl 25 mg 2 tablets daily as needed for itching. 5. Temazepam 7.5 mg daily at bedtime. 6. Tramadol 50 mg twice a day. 7. Nuvigil 125 mg daily. 8. Slow-Mag 2 tablets twice a day. 9. Potassium chloride oral liquid 10% three teaspoons twice a day. 10.Amlodipine 5 mg daily. 11.Aspirin enteric-coated 81 mg daily. 12.Tizanidine 2 mg twice a day. 13.Levothyroxine 137 mg daily. 14.Plavix 75 mg  daily. 15.Crestor 5 mg daily. 16.Cymbalta 30 mg plus 60 mg tab every morning.  SOCIAL HISTORY:  The patient stopped smoking about 5 years ago, does not drink any alcohol, is not working after her stroke.  She lives alone. Her son lives nearby.  FAMILY HISTORY:  Heart disease.  Mother has diabetes type 2.  Also history of depression in the family.  REVIEW OF SYSTEMS:  Positive for a 60-pound weight loss since February 2012 associated with poor appetite and change in sense of smell.  She is quite fatigued all the time.  HEENT:  No sore throat, sinus trouble, earache, or change in vision.  She only has trouble swallowing pills and usually need some applesauce or a lot of water.  Otherwise, she states she is fine with liquids and with food.  RESPIRATORY:  No cough or wheezing lately.  CARDIAC:  No chest pain, palpitations, or pedal edema. GI:  Positive for nausea and vomiting attributed to her potassium tablets and then the potassium liquid, which she was taking on an empty stomach.  She does not admit to any abdominal pain.  She has been constipated for 3 weeks.  She states she has not had a single bowel movement in 3 weeks.  GU:  No dysuria or hematuria.  She does have difficulty starting urination, but afterwards has no trouble. MUSCULOSKELETAL:  No arthritis or back pain.  NEUROLOGIC:  Occasional numbness in the left side of her face.  Otherwise, she states or stroke symptoms have resolved.  No history of seizures.  PSYCHOLOGIC:  She states she is very depressed.  She does see her psychiatrist and therapist regularly.  PHYSICAL EXAM:  GENERAL:  Middle-aged female lying in bed, in no acute distress. VITALS:  Temperature 98.1, pulse 74, respirations 22, blood pressure 118/78, pulse ox 99% on room air. HEENT:  Pupils equal, round, reactive to light.  Extraocular movements are intact.  Conjunctiva is pink.  No scleral icterus.  Oral mucosa is quite dry.  Oropharynx clear. NECK:   Supple.  No thyromegaly, lymphadenopathy, or carotid bruits. HEART:  Regular rate and rhythm.  No murmurs. LUNGS:  Clear bilaterally.  Normal respiratory effort.  No use of accessory muscles. ABDOMEN:  Soft.  There is mild diffuse tenderness throughout her abdomen.  It is nondistended.  Bowel sounds are positive. EXTREMITIES:  No cyanosis, clubbing, or edema.  Pedal pulses positive. NEUROLOGIC:  Cranial nerves II through XII intact.  She is able to move all four extremities. PSYCHOLOGIC:  She is awake, alert, oriented x3.  She has a very, very flat affect.  LABORATORY DATA:  Blood work  thus far have metabolic panel only which is from yesterday.  Sodium was 140, potassium 2.6, chloride 101, bicarb 29, glucose 94, BUN 15, creatinine 1.11.  LFTs are within normal limits. Magnesium is low at 1.1.  ASSESSMENT AND PLAN: 1. Hypokalemia and hypomagnesemia.  I am not sure if the patient is     having some absorption difficulties or the dosages that she is on     are just inadequate for her.  For now, we will replace these IV.     We will recheck levels regularly.  Eventually, we will place her     back on her oral medications and try to find the right dosages for     her. 2. Severe constipation.  This may be related to her poor p.o. intake.     I will get a KUB to ensure that she is not impacted. 3. Severe weight loss due to depression and related to anorexia.  We     may need to start her on an appetite stimulant.  For now, I will     get a prealbumin level to assess her nutritional status. 4. Vomiting.  Apparently was essentially due to taking the potassium     tablets and then taking the potassium liquid on an empty stomach.     However, we will continue to watch her while she is here.  If she     does have a significant amount of vomiting, we may need to get GI     involved.  She is already on a proton pump inhibitor which I will     continue. 5. Major depression.  This is being managed  by her psychiatrist and     psychologist as an outpatient. 6. Right middle cerebral artery infarct. 7. Coronary artery disease. 8. Obstructive sleep apnea.  We will get her a machine to use while     she is here.  Time on admission was 60 minutes.     Calvert Cantor, M.D.     SR/MEDQ  D:  05/23/2011  T:  05/23/2011  Job:  [id number]  cc:   Neta Mends. Fabian Sharp, MD Arturo Morton. Riley Kill, MD, [facility]  Electronically Signed by Calvert Cantor M.D. on 06/26/2011 07:15:38 PM

## 2011-06-27 ENCOUNTER — Telehealth: Payer: Self-pay | Admitting: Cardiology

## 2011-06-27 NOTE — Telephone Encounter (Signed)
PT WAS GIVEN A PERSCRIPTION FOR K+ AND MAGNISIUM AT LAST VISIT WITH [facility] AND SHE LOST IT PLEASE CALL INTO [facility] ON [address]

## 2011-06-28 ENCOUNTER — Other Ambulatory Visit: Payer: Self-pay | Admitting: Internal Medicine

## 2011-06-29 LAB — ALDOSTERONE + RENIN ACTIVITY W/ RATIO: Aldosterone: 13 ng/dL

## 2011-07-01 ENCOUNTER — Telehealth: Payer: Self-pay | Admitting: *Deleted

## 2011-07-01 NOTE — Telephone Encounter (Signed)
Agree. Patient has not taken K+ supplementation since discharge.  Recent K+ normal by labs done at PCPs office. She can discuss with Dr. Fabian Sharp at [facility] tomorrow what dose of K+ to be on, etc. Tereso Newcomer, PA-C

## 2011-07-01 NOTE — Telephone Encounter (Signed)
This is an addendum to pt's call. I d/w Tereso Newcomer, PA-C about pt wanting to go back on the tablet form of K+ he reviewed her last ov note with him as well as the hospital d/c med list. He was fine with switching pt back to tablet form of K+ however she needed to f/u bmet with Dr. Fabian Sharp office. I cb pt and advised about the K+ rx and when I verified again the K+ dose pt now states to me that she did not have the tablets since the hospital and she d/c liquid K+ due to nausea and so she has not had any K+ since the hospital. Due to the NEW information from the pt I d/w again with Tereso Newcomer, PA-C and he advised we do not send in the K+ today and that Dr. Fabian Sharp needs to d/w pt since she drew her last labs and has appt 07/02/11 w/pt and that Dr. Fabian Sharp needs to be the one to adjust K+ for the pt. I re-inerated this message several times to pt but I still am not sure if she completely understood.

## 2011-07-01 NOTE — Telephone Encounter (Signed)
When I called pt back to tell her that we would send in rx K+, I verified again what she states she has been taking and the pt now states she has not taken any K+ since she has been out of the hospital because she did not have the tablets and said the liquid makes her nauseated, also states since she was told her K+ level normal she has not taken K+. As per Tereso Newcomer, PA-C after the new information from the pt we are not going to fill her K+ and that she needs to d/w PCP Dr. Fabian Sharp @ 07/02/11 OV and since their office the last lab work last week that Dr. Fabian Sharp needs to be the one to follow up on her K+, I re-inerated this message several times to the pt but I am still not sure if she truly understood. Danielle Rankin

## 2011-07-01 NOTE — Telephone Encounter (Signed)
S/w pt about RX for K+ and magnesium. Pt said she does not need mag filled. Pt did not pick up meds on the 06/14/11 and so the pharmacy put meds back on the shelf, pt calling today and wanting refills now. Pt states she was in the hospital again and was switched back over to K+ 20 meq tablet 2 tabs bid, from previously being on liquid K+, pt states she wants to stay on the tablet form. I explained that I needed to d/w PA to have the change made. Advised wcb when rx called in, pt gave verbal understanding today. Danielle Rankin  Ok per Tereso Newcomer, PA-C to change K+ back to tablet form 20 meq 2 tabs bid per hospital d/c 06/20/11  Advised pt she needs to have f/u bmet with her PCP Dr. Fabian Sharp as per Tereso Newcomer, PA-C

## 2011-07-01 NOTE — Telephone Encounter (Signed)
error 

## 2011-07-02 ENCOUNTER — Encounter: Payer: Self-pay | Admitting: Internal Medicine

## 2011-07-02 ENCOUNTER — Ambulatory Visit (INDEPENDENT_AMBULATORY_CARE_PROVIDER_SITE_OTHER): Payer: PRIVATE HEALTH INSURANCE | Admitting: Internal Medicine

## 2011-07-02 VITALS — BP 126/80 | Temp 98.6°F | Wt 170.0 lb

## 2011-07-02 DIAGNOSIS — R1110 Vomiting, unspecified: Secondary | ICD-10-CM

## 2011-07-02 DIAGNOSIS — G4730 Sleep apnea, unspecified: Secondary | ICD-10-CM

## 2011-07-02 DIAGNOSIS — K8020 Calculus of gallbladder without cholecystitis without obstruction: Secondary | ICD-10-CM

## 2011-07-02 DIAGNOSIS — E876 Hypokalemia: Secondary | ICD-10-CM

## 2011-07-02 DIAGNOSIS — E785 Hyperlipidemia, unspecified: Secondary | ICD-10-CM

## 2011-07-02 DIAGNOSIS — R110 Nausea: Secondary | ICD-10-CM

## 2011-07-02 DIAGNOSIS — F4321 Adjustment disorder with depressed mood: Secondary | ICD-10-CM

## 2011-07-02 DIAGNOSIS — E039 Hypothyroidism, unspecified: Secondary | ICD-10-CM

## 2011-07-02 DIAGNOSIS — I10 Essential (primary) hypertension: Secondary | ICD-10-CM

## 2011-07-02 DIAGNOSIS — R431 Parosmia: Secondary | ICD-10-CM

## 2011-07-02 DIAGNOSIS — R439 Unspecified disturbances of smell and taste: Secondary | ICD-10-CM

## 2011-07-02 DIAGNOSIS — E46 Unspecified protein-calorie malnutrition: Secondary | ICD-10-CM

## 2011-07-02 LAB — MAGNESIUM: Magnesium: 1.6 mg/dL (ref 1.5–2.5)

## 2011-07-02 LAB — TSH: TSH: 0.14 u[IU]/mL — ABNORMAL LOW (ref 0.35–5.50)

## 2011-07-02 LAB — BASIC METABOLIC PANEL
CO2: 28 mEq/L (ref 19–32)
Chloride: 101 mEq/L (ref 96–112)
Potassium: 3.1 mEq/L — ABNORMAL LOW (ref 3.5–5.1)
Sodium: 140 mEq/L (ref 135–145)

## 2011-07-02 LAB — T4, FREE: Free T4: 1.04 ng/dL (ref 0.60–1.60)

## 2011-07-02 NOTE — H&P (Signed)
[name], KAHAN NO.:  [id number]  MEDICAL RECORD NO.:  [id number]  LOCATION:                                 FACILITY:  PHYSICIAN:  Osvaldo Shipper, MD     DATE OF BIRTH:  [birth year]  DATE OF ADMISSION:  06/19/2011 DATE OF DISCHARGE:                             HISTORY & PHYSICAL   PRIMARY CARE PHYSICIAN:  Neta Mends. Panosh, MD  CARDIOLOGIST:  Arturo Morton. Riley Kill, MD, [facility] with [facility]  PULMONOLOGIST AND SLEEP SPECIALIST:  [name], MD, FCCP, FACP  ADMISSION DIAGNOSES: 1. Severe hypokalemia along with hypomagnesemia. 2. History of coronary artery disease, status post coronary artery     bypass graft. 3. History of stroke earlier this year. 4. History of sleep apnea. 5. Recent admission for hypokalemia and hypomagnesemia.  CHIEF COMPLAINT:  Low potassium levels.  HISTORY OF PRESENT ILLNESS:  The patient is a 55 year old Caucasian female with a past medical history of coronary artery disease, stroke, obstructive sleep apnea, who was in the hospital in September when she was admitted for about 4 days because of hypokalemia.  She was found to have hypomagnesemia as well.  This was aggressively corrected in the hospital, and she was discharged home.  The patient tells me that when she was discharged she was not prescribed any magnesium or potassium. She went to see her cardiologist a few days ago, had lab work done, which revealed a low potassium level and low magnesium level.  He was recommended that the patient be admitted to the hospital; however, she wanted to wait a day or two.  She tried taking some potassium at home. However, despite this, the potassium levels not come up.  So after discussions with the primary care physician, the patient was recommended to come back into the emergency department.  The patient otherwise denies any muscle cramps per se at this time.  She tells me that she has been dealing with her low magnesium and low  potassium levels for the past 3 months or so.  At that time, she was on Lasix, however, she was taken off of it.  The exact reasons for her low magnesium and low potassium levels are not clear, however, has been blamed on nutritional etiology.  She tells me that over the last few weeks she has noticed some worsening shortness of breath and swelling of her ankles.  She did see her cardiologist for the same; however; they were not quite impressed by her symptomatology.  I did see her ambulate in the emergency department without any difficulty.  MEDICATIONS AT HOME: 1. Nuvigil 150 mg daily. 2. Crestor 5 mg daily. 3. Hydroxyzine 10 mg every 4-6 hours as needed for itching. 4. Levothyroxine 137 mcg daily. 5. Norvasc 5 mg daily. 6. Plavix 75 mg daily. 7. Protonix 40 mg daily. 8. Seroquel XR 200 mg q.p.m. 9. Temazepam 7.5 mg at bedtime, however, she has not been taking this     since she started taking Seroquel recently. 10.Tizanidine 2 mg b.i.d. 11.Tramadol 50 mg b.i.d. to t.i.d. as needed for shoulder pain as well     as chest pain. 12.Cymbalta 30 mg daily.  ALLERGIES: 1. PENICILLIN. 2. SULFA. 3. CODEINE. She tells me that when she took AVELOX she had some episode with psychosis.  SURGICAL HISTORY:  Includes coronary artery bypass grafting and this was apparently complicated by infection and she required multiple debridements.  As a result, she has chronic chest wall pain.  She also had arthroscopic knee surgeries, bladder sling, mastoidectomy, tonsillectomy and adenoidectomy.  PAST MEDICAL HISTORY:  Coronary artery disease, status post CABG.  She has had stenting in the past as well.  History of right MCA infarct in February 2012 resulting in left upper extremity weakness, left facial droop, and slurred speech.  She has had some dysphagia in the past.  She has had a history of obstructive sleep apnea, history of bradycardia, hypertension and a 45-minute episode of slurred  speech on February 11, 2011, which was diagnosed with TIA.  History of daytime somnolence, hyperlipidemia, hypothyroidism, obesity.  She tells me she had diastolic heart failure, but no mention of this in the echocardiograms.  History of eczema.  SOCIAL HISTORY:  Lives in [place] by herself.  Her son lives close by.  She quit smoking 5 years ago.  Denies any alcohol use.  No illicit drug use.  FAMILY HISTORY:  Positive for heart disease in her father who died of a sudden heart attack at the age of 53.  Mother had rheumatoid arthritis, depression, COPD, hypertension.  REVIEW OF SYSTEMS:  GENERAL:  Positive for a 53-pound weight loss since February with decreased appetite and change in her sense of smell.  She is complains of weakness and fatigue.  HEENT:  Otherwise unremarkable. CARDIOVASCULAR:  As in HPI.  RESPIRATORY:  As in HPI.  GI:  Denies any diarrhea.  GU:  Admits to some urinary retention issues for which she is followed by a urologist, Dr. Margarita Grizzle.  NEUROLOGIC:  Unremarkable currently.  PSYCHIATRIC:  Unremarkable currently.  Other systems reviewed and found to be negative.  PHYSICAL EXAMINATION:  VITAL SIGNS:  Temperature 97.7, blood pressure 126/85, heart rate 62, respiratory rate 20, saturation 100% on room air. GENERAL:  She is a thin white female, in no distress. HEENT:  Head is normocephalic, atraumatic.  Pupils are equal, reacting. No pallor.  No icterus.  Oral mucous membrane is moist.  No oral lesions are noted. NECK:  Soft and supple.  No thyromegaly is appreciated. LUNGS:  Clear to auscultation bilaterally with no wheezing, rales, or rhonchi. CARDIOVASCULAR:  S1 and S2 is normal, regular.  No S3, S4, rubs, murmurs, or bruits. ABDOMEN:  Soft, nontender, and nondistended.  Bowel sounds are present. No masses or organomegaly is appreciated.  There is no cervical, supraclavicular, or inguinal lymphadenopathy appreciated. GU:  Deferred. MUSCULOSKELETAL:  Normal  muscle mass and tone. NEUROLOGIC:  She is alert, oriented x3.  No focal neurological deficits are present. SKIN:  Does not reveal any rashes.  LABORATORY DATA:  Her white cell count is 4.6, hemoglobin is 10.6, platelet count is 230, MCV is 84.  Potassium 2.6.  Other electrolytes are normal.  Renal function is normal.  Magnesium level was 1.2 initially and after she got intravenous magnesium, it came up to 2.0. No imaging studies have been done.  She did have an EKG, which shows sinus rhythm, 67, normal axis, intervals appear to be in the normal range.  No Q-waves.  No concerning ST changes.  There is diffuse T inversion.  This EKG is similar to the one from July.  ASSESSMENT:  This is a 55 year old Caucasian  female with medical history as stated earlier presents with hypokalemia.  The last time she was discharged from the hospital, she was not put on any supplementation. The reason for her hypokalemia and, hypomagnesemia is thought to be due to her nutritional status.  No other etiology is found at this time.  PLAN: 1. Hypokalemia and hypomagnesemia.  This will be repleted     aggressively.  Magnesium and potassium levels will be checked over     the course of the day today. 2. Subjective dyspnea.  We will check a chest x-ray.  She does not     have any wheezing at this time, so no treatment will be provided     for now.  Her lungs are clear on examination. 3. History of coronary artery disease, status post CABG is stable.     Continue with current medications. 4. History of depression.  Continue with Cymbalta and Seroquel. 5. History of hypothyroidism.  Continue with levothyroxine.  She     mentions constipation.  We will check her thyroid function tests,     put her on laxatives as well. 6. Chronic chest wall pain.  Continue with as needed tramadol. 7. Anemia, appears to be chronic.  Defer management and evaluation to     PCP. 8. The patient is a full code. 9. History of  sleep apnea, CPAP at night. 10.Further management decisions will depend on results of further     testing and the patient's response to treatment.   Osvaldo Shipper, MD     GK/MEDQ  D:  06/19/2011  T:  06/19/2011  Job:  [id number]  cc:   Neta Mends. Fabian Sharp, MD  Electronically Signed by Osvaldo Shipper MD on 07/02/2011 07:06:56 PM

## 2011-07-02 NOTE — Progress Notes (Signed)
  Subjective:    Patient ID: [name], female    DOB: [birth year], 55 y.o.   MRN: [id number]  HPI Patient comes in today for follow up of  multiple medical problems.   Depression ;   Under care  off the cymbalta  Cause of urinary se but thinks it helped. GI: ocass upper abd pain  Vomited x 1 ? Cause  Nausea nd throat closed up even when eating  pudding    .   Still smells bad odors no suggestion by neuro  and hard to eat. No diuretics at all and no supplement since hospitalization. CVA motor:   Larey Seat out of be once a few weeks ago  Messing with cpap to adjust    Thyroid taking med regularly.  Review of Systems No fever  New ha cp has had some leg cramps at night reviewed medication at length. Chronic pain  rx no help  Past history family history social history reviewed in the electronic medical record. Just hospitalized 10 10 and rx for low mg and potassium    Adrenal MRI and aldo pra done pending at dc since normal ad adrenal i determinant   rec fu imaging in future.    Objective:   Physical Exam Wdwn in nad  Looks somewhat sad but more alert. heent : No lesion facial drooping Neck no masses  Chest:  Clear to A&P without wheezes rales or rhonchi CV:  S1-S2 no gallops or murmurs peripheral perfusion is normal No clubbing cyanosis or edema  Reviewed hospital labs and abd mri  Non definitive adrenal nodule  PRA also ratio is normal     Assessment & Plan:  Hypokalemia and mg  Unsure of cause as persists despite d/cing poss inciters  Better  Still has  Nausea and vomit one time ? gastroparesis  Vs poss gall bladder disease .  No diuretic adrenal lesion non speci fic  and pra /aldo is normal  Thyroid     Abnormal could be   Sick thyroid issue  / if need to increase. Depression  Better on cymbalta  But had gu se . Social  Living   Alone   Looking into shared household.  OSA reeval  Equipment that may help fatigue.  CVA and cad Very difficult situation for patient and many issues  causing her distress but actually improved from prev visits . Get labs today and then plan next step  Poss gi and endo input.   Total visit > 50% spent counseling and coordinating care  Review information .

## 2011-07-02 NOTE — Discharge Summary (Signed)
NAMETANJA, GIFT NO.:  [id number]  MEDICAL RECORD NO.:  [id number]  LOCATION:                                 FACILITY:  PHYSICIAN:  Osvaldo Shipper, MD     DATE OF BIRTH:  Jul 30, 1956  DATE OF ADMISSION:  06/18/2011 DATE OF DISCHARGE:  06/20/2011                              DISCHARGE SUMMARY   PRIMARY CARE PHYSICIAN:  Neta Mends. Panosh, MD  No consultations obtained during this admission.  IMAGING STUDIES:  A chest x-ray, which showed no evidence for acute cardiopulmonary process.  Chronic interstitial markings were noted.  PERTINENT LABORATORY DATA:  Hemoglobin of 10.6 with normal MCV. Potassium was 2.6, magnesium was 1.2 at admission, currently 3.7 and 1.7 respectively.  TSH is 0.29, however free T4 is 0.72.  DISCHARGE DIAGNOSES: 1. Hypokalemia and hypomagnesemia, etiology unclear. 2. Left adrenal mass. 3. Rule out aldosterone secreting adrenal tumor. 4. History of hypertension. 5. History of coronary artery disease status post coronary artery     bypass grafting, stable. 6. History of sleep apnea, stable.  BRIEF HOSPITAL COURSE: 1. Hypokalemia and hypomagnesemia.  This is a 55 year old Caucasian     female who presented to the hospital after she was found to have     persistent hypokalemia.  She was admitted back in mid September     with similar complaints.  She was evaluated in the ED, they tried     to replete her potassium in the ED with no success, and so we were     called for inpatient hospitalization.  The patient was given IV     potassium as well as oral potassium along with oral magnesium.     Both her levels have improved.  Again reason for her low magnesium     and potassium levels are not entirely clear.  Dr. Margarita Grizzle, her     urologist who was following her for some urinary retention issues     called me up once he found out that she was admitted for     hypokalemia.  He had done an MRI of her renal system and had     detected  an adrenal mass in the left side.  It was 1.2 cm, it was     nonspecific.  However in view of her electrolyte abnormalities, an     aldosterone secreting tumor cannot be excluded.  So in order to     further characterize this, we have ordered renin activity levels as     well as aldosterone levels.  All of these are pending at this time.     I have spoken to Dr. Fabian Sharp this morning and I have apprised her     that these labs are pending and should be followed up as an     outpatient.  If these are abnormal, the patient should be referred     to an endocrinologist.  Dr. Margarita Grizzle also told me that he did order     some other labs in his office and her serum free metanephrine level     was less than 25, which is the normal range and his serum  free     normetanephrine was 162, normal being less than 148. 2. Rest of her medical issues were all stable during this     hospitalization.  She did require CPAP machine during the night for     sleep apnea.  On the day of discharge, the patient is feeling well, keen on going home.  Denies any new complaints.  Her vital signs are all stable.  Her examination is completely benign.  ASSESSMENT/PLAN:  As per above.  DISCHARGE MEDICATIONS: 1. Magnesium oxide 400 mg 3 times a day. 2. Potassium chloride 20 mEq tablets, 2 tablets twice daily. 3. Amlodipine 5 mg every morning. 4. Aspirin 81 mg every morning. 5. Crestor 5 mg every morning. 6. Cymbalta 30 mg every morning. 7. Hydroxyzine 10 mg 4 times a day and 2 tablets at bedtime. 8. Levothyroxine 137 mcg every morning. 9. Lidex cream 3 times a day as needed topically for eczema. 10.Nuvigil 150 mg every morning. 11.Protonix 40 mg every morning. 12.Plavix 75 mg every morning. 13.Senokot over-the-counter daily. 14.Seroquel 200 mg every evening. 15.Tizanidine 2 mg 3 times a day. 16.Tramadol 50 mg 3 times a day.  DISCHARGE INSTRUCTIONS: 1. Follow up with Dr. Fabian Sharp, she has an appointment on July 02, 2011. 2. BMET and magnesium level on June 24, 2011, a prescription has     been written.  RECOMMENDATIONS TO THE PCP: 1. Please follow up on results of renin activity and aldosterone renin     ratios. 2. Please repeat her thyroid function tests in 4-6 weeks. 3. Diet, heart healthy. 4. Physical activity as before.  Total time on this discharge encounter 35 minutes.  Osvaldo Shipper, MD     GK/MEDQ  D:  06/20/2011  T:  06/20/2011  Job:  [id number]  cc:   Neta Mends. Fabian Sharp, MD  Electronically Signed by Osvaldo Shipper MD on 07/02/2011 07:06:16 PM

## 2011-07-02 NOTE — Patient Instructions (Addendum)
Will notify you  of labs when available. For now no Mg and no potassium supplements.  We need to look into the reason for the nausea now. POss glallbladder or meds or ? gastoparesis  pain.   Tak with your psychiatrist about the cymbalta.  You thyroid may need to be adjutsed.   Plan follow up depending on labs

## 2011-07-03 ENCOUNTER — Encounter: Payer: PRIVATE HEALTH INSURANCE | Attending: Neurosurgery | Admitting: Neurosurgery

## 2011-07-03 DIAGNOSIS — G894 Chronic pain syndrome: Secondary | ICD-10-CM

## 2011-07-03 DIAGNOSIS — M7520 Bicipital tendinitis, unspecified shoulder: Secondary | ICD-10-CM

## 2011-07-03 DIAGNOSIS — Z951 Presence of aortocoronary bypass graft: Secondary | ICD-10-CM | POA: Insufficient documentation

## 2011-07-03 DIAGNOSIS — R071 Chest pain on breathing: Secondary | ICD-10-CM | POA: Insufficient documentation

## 2011-07-03 LAB — PROLACTIN: Prolactin: 7.4 ng/mL

## 2011-07-03 MED ORDER — POTASSIUM CHLORIDE 10 MEQ PO TBCR: 10.0000 meq | EXTENDED_RELEASE_TABLET | Freq: Three times a day (TID) | ORAL | Status: DC

## 2011-07-03 NOTE — Progress Notes (Signed)
Quick Note:  Pt is aware and will call back tomorrow to make an appt for labs. ______

## 2011-07-04 ENCOUNTER — Other Ambulatory Visit: Payer: Self-pay | Admitting: Internal Medicine

## 2011-07-04 ENCOUNTER — Encounter: Payer: Self-pay | Admitting: Internal Medicine

## 2011-07-04 DIAGNOSIS — E876 Hypokalemia: Secondary | ICD-10-CM

## 2011-07-05 ENCOUNTER — Other Ambulatory Visit (INDEPENDENT_AMBULATORY_CARE_PROVIDER_SITE_OTHER): Payer: PRIVATE HEALTH INSURANCE

## 2011-07-05 DIAGNOSIS — E876 Hypokalemia: Secondary | ICD-10-CM

## 2011-07-05 LAB — POTASSIUM: Potassium: 3 mEq/L — ABNORMAL LOW (ref 3.5–5.1)

## 2011-07-07 ENCOUNTER — Encounter: Payer: Self-pay | Admitting: Internal Medicine

## 2011-07-08 NOTE — Assessment & Plan Note (Signed)
This is a patient of Dr. Wynn Banker, seen status post right MCA distribution infarct.  She is status post CABG with post-sternotomy pain syndrome.  She has also had some left shoulder pain.  She states that her left shoulder injection she had some relief for a few days, but nothing that lasted.  She states she feels like her sternal pain is worse.  She rates her pain at 7-9, sharp, burning, stabbing pain. General activity level is 6-10.  Pain is worse in the morning.  Sleep patterns are fair.  She states aggravating factors vary.  Med helps some.  MOBILITY:  She uses a cane for ambulation.  She does not climb steps or drive.  She can walk about 30 minutes at a time.  She is on disability. She needs help with meal prep, household duties, and shopping.  REVIEW OF SYSTEMS:  Notable for difficulties described above as well as fevers, night sweats, weight loss, constipation, nausea, poor appetite, abdominal pain, urinary retention, cough and shortness of breath, sleep apnea, numbness, tingling, dizziness, confusion, depression, and anxiety.  No suicidal thoughts.  She is not on narcotics from this office.  PAST MEDICAL HISTORY:  Unchanged.  SOCIAL HISTORY:  She lives alone.  FAMILY HISTORY:  Unchanged.  PHYSICAL EXAM:  Blood pressure is 125/82, pulse 77, respirations 18, O2 sats 96 on room air.  Motor strength and sensation are intact.  She does have tenderness across her sternum.  Constitutionally, she is within normal limits.  She is oriented x3, somewhat depressed and anxious.  Has a normal gait.  IMPRESSION: 1. History of biceps tendinitis, resolved with injection. 2. Chest wall pain, status post coronary artery bypass grafting.  PLAN:  She will continue her current medications which are Voltaren gel and Zanaflex.  She states she also takes tramadol which appears to be discontinued on her chart.  She had no refill needs today.  No prescriptions were given.  Questions were  encouraged and answered.  She has an appointment with Dr. Wynn Banker for 2 weeks.     Eder Macek L. Blima Dessert Electronically Signed    RLW/MedQ D:  07/03/2011 16:10:96  T:  07/03/2011 13:01:15  Job #:  [id number]

## 2011-07-09 ENCOUNTER — Telehealth: Payer: Self-pay | Admitting: Internal Medicine

## 2011-07-09 DIAGNOSIS — E876 Hypokalemia: Secondary | ICD-10-CM

## 2011-07-09 NOTE — Telephone Encounter (Signed)
Message copied by Romualdo Bolk on Tue Jul 09, 2011  3:15 PM ------      Message from: [facility], [place] [facility]      Created: Tue Jul 09, 2011  9:26 AM       Tell patient that her potassium 3.0 on Friday .  We need to recheck potassium with magnesium level tomorrow and if not coming up then will add aldactone  Med .             Repeat aldosterone level is not back yet.   ( will probably  Be normal)        Please arrange  Referral to  Nephrology  In the meantime.

## 2011-07-09 NOTE — Telephone Encounter (Signed)
Pt called req lab results. Pls call back asap.

## 2011-07-09 NOTE — Telephone Encounter (Signed)
Pt aware of results and order placed in epic

## 2011-07-10 ENCOUNTER — Ambulatory Visit: Payer: PRIVATE HEALTH INSURANCE | Admitting: Internal Medicine

## 2011-07-10 ENCOUNTER — Ambulatory Visit (INDEPENDENT_AMBULATORY_CARE_PROVIDER_SITE_OTHER): Payer: PRIVATE HEALTH INSURANCE | Admitting: Physician Assistant

## 2011-07-10 ENCOUNTER — Encounter: Payer: Self-pay | Admitting: Physician Assistant

## 2011-07-10 ENCOUNTER — Other Ambulatory Visit (INDEPENDENT_AMBULATORY_CARE_PROVIDER_SITE_OTHER): Payer: PRIVATE HEALTH INSURANCE

## 2011-07-10 VITALS — BP 110/76 | HR 58 | Ht 64.0 in | Wt 167.2 lb

## 2011-07-10 DIAGNOSIS — E876 Hypokalemia: Secondary | ICD-10-CM

## 2011-07-10 DIAGNOSIS — Z7901 Long term (current) use of anticoagulants: Secondary | ICD-10-CM

## 2011-07-10 DIAGNOSIS — R110 Nausea: Secondary | ICD-10-CM

## 2011-07-10 DIAGNOSIS — R1013 Epigastric pain: Secondary | ICD-10-CM

## 2011-07-10 LAB — POTASSIUM: Potassium: 3.3 mEq/L — ABNORMAL LOW (ref 3.5–5.1)

## 2011-07-10 LAB — MAGNESIUM: Magnesium: 1.4 mg/dL — ABNORMAL LOW (ref 1.5–2.5)

## 2011-07-10 NOTE — Patient Instructions (Signed)
You have been scheduled for an endoscopy on 08-07-11.  Instructions have been given You may continue taking Protonix. Stay on your aspirin and plavix.

## 2011-07-10 NOTE — Progress Notes (Signed)
Subjective:    Patient ID: [name], female    DOB: [birth year], 55 y.o.   MRN: [id number]  HPI [name] is a complicated 55 year old female who 2 GI today who is referred with complaints of epigastric pain weight loss and nausea. She reports a very remote history of peptic ulcer disease. She says her current symptoms started in April of 2012 after she had a stroke at the end of February 2012. She is maintained on aspirin and Plavix. She has been on protonic 40 mg daily for several months with no improvement in her symptoms. She complains primarily of epigastric discomfort intermittent nausea and has had a couple of occasions of vomiting. She says her pain is fairly constant. She says her appetite has been poor since she had her stroke and that her sense of smell and taste or off. She says she takes easily with certain textures and feels that her weight loss is doing prior to these factors as well as some neurogenic dysphasia which present persists. She says generally she feels better after meals. She does not noted any changes in her symptoms after bowel movements she has been somewhat constipated in using senna on a daily basis which does work. She had been given MiraLax but I cannot tolerate the texture. She has not noted any melena or hematochezia. Patient has lost weight since February 2012, patent patient unsure of amount probably 15-20 pounds.  Recent lab done 06/24/2011 showed WBC of 7 hemoglobin 12.9 hematocrit 37.9 MCV of 90 platelets 299 potassium 4 magnesium 1.8 BUN 14 creatinine 1  MRI of the abdomen done 06/12/2011 to followup on an indeterminate left adrenal mass seen on CT scan shows a 1.2 cm nonspecific left adrenal mass no worrisome imaging features reasonably benign recommended followup in 12 months cholelithiasis is noted but no evidence of cholecystitis or biliary ductal dilation liver pancreas spleen and kidneys have normal appearance.  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    Review of Systems  Constitutional: Positive for fatigue.  HENT: Positive for trouble swallowing.   Eyes: Negative.   Respiratory: Negative.   Cardiovascular: Positive for chest pain.  Gastrointestinal: Positive for nausea and abdominal pain.  Genitourinary: Negative.   Musculoskeletal: Positive for gait problem.  Skin: Negative.   Neurological: Positive for facial asymmetry and weakness.  Hematological: Negative.   Psychiatric/Behavioral: Negative.    Outpatient Prescriptions Prior to Visit  Medication Sig Dispense Refill  . amLODipine (NORVASC) 5 MG tablet Take 1 tablet (5 mg total) by mouth daily.  30 tablet  9  . aspirin 81 MG tablet Take 81 mg by mouth daily.        . clopidogrel (PLAVIX) 75 MG tablet Take 1 tablet (75 mg total) by mouth daily.  30 tablet  6  . fluocinonide (LIDEX) 0.05 % ointment Apply topically 2 (two) times daily.  30 g  0  . levothyroxine (SYNTHROID, LEVOTHROID) 137 MCG tablet TAKE ONE TABLET BY MOUTH ONE TIME DAILY  30 tablet  1  . pantoprazole (PROTONIX) 40 MG tablet Take 1 tablet (40 mg total) by mouth daily.  30 tablet  12  . QUEtiapine (SEROQUEL XR) 200 MG 24 hr tablet 200 mg at bedtime.        . rosuvastatin (CRESTOR) 5 MG tablet Take 1  tablet (5 mg total) by mouth daily.  30 tablet  9  . senna (SENOKOT) 8.6 MG tablet Take 1 tablet by mouth daily.        . temazepam (RESTORIL) 7.5 MG capsule Take 1 capsule (7.5 mg total) by mouth at bedtime as needed.  30 capsule  0  . tizanidine (ZANAFLEX) 2 MG capsule Take 2 mg by mouth 2 (two) times daily.       . traMADol (ULTRAM) 50 MG tablet Take 50 mg by mouth as needed.        . Armodafinil (NUVIGIL) 150 MG tablet Take 1 tablet (150 mg total) by mouth daily.  30 tablet  2  .  diclofenac sodium (VOLTAREN) 1 % GEL Apply 1 application topically 4 (four) times daily.        . potassium chloride (KLOR-CON) 10 MEQ CR tablet Take 1 tablet (10 mEq total) by mouth 3 (three) times daily.  90 tablet  1   Allergies  Allergen Reactions  . Amoxicillin     REACTION: unspecified  . Avelox (Moxifloxacin Hcl In Nacl)   . Hydrocodone     REACTION: itching  . Penicillins     REACTION: hives  . Sulfonamide Derivatives     REACTION: ?       Objective:   Physical Exam Well-developed white female in no acute distress, chronically ill-appearing blood pressure 110/76 pulse 58 HEENT mild facial droop on the left, anicteric,Neck; Supple no JVD, Cardiovascular; regular rate and rhythm with S1 and S2, soft systolic murmur, Pulmonary; clear bilaterally ,Abdomen; soft mildly tender in the epigastrium ,no guarding, no rebound, no palpable mass or hepatosplenomegaly ,bowel sounds active, Rectal; Hemoccult-negative, Extremities; no clubbing cyanosis or edema ,she does have weakness of the left upper ext and LLe,Psych; mood and affect appropriate.        Assessment & Plan:  #28 55 year old female with history of coronary artery disease status post CABG and CVA  February 2012 with residual deficits as outlined above including taste and smell disorder presenting with several month history of epigastric discomfort nausea and weight loss. She does have documented cholelithiasis, but symptoms at this time are not consistent with biliary colic. Rule out peptic ulcer disease or medication-induced gastric gastropathy. Suspect at least a component of her weight loss is due to altered taste and smell with her CVA.  Plan; increase Protonix to 40 mg by mouth twice daily Schedule for upper endoscopy with Dr. Russella Dar with propofol. Procedure discussed in detail with patient and she is agreeable to proceed, she will remain on her aspirin and Plavix given recent stroke. If the EGD is negative would consider CCK  HIDA scan, however she is a poor surgical candidate given her multiple comorbidities.

## 2011-07-11 ENCOUNTER — Telehealth: Payer: Self-pay | Admitting: *Deleted

## 2011-07-11 ENCOUNTER — Encounter: Payer: Self-pay | Admitting: Physician Assistant

## 2011-07-11 DIAGNOSIS — E876 Hypokalemia: Secondary | ICD-10-CM

## 2011-07-11 MED ORDER — SPIRONOLACTONE 25 MG PO TABS: 25.0000 mg | ORAL_TABLET | Freq: Every day | ORAL | Status: DC

## 2011-07-11 NOTE — Progress Notes (Signed)
Reviewed and agree with management. Malcolm T. Stark MD FACG 

## 2011-07-11 NOTE — Progress Notes (Signed)
Reviewed and agree with management. Lashunta Frieden T. Yeng Frankie MD FACG 

## 2011-07-11 NOTE — Telephone Encounter (Signed)
Spoke to pt- She thought that we d/c her potassium.  Spirolactone 25mg  daily. Do not take klor con. Repeat bmp in thur. Next dx hypoklamia and hypomagemia.  Pt aware of this. She states that she may not be able to get this filled due to her having ins problems. Pt to call us and let us know what she is going to do.

## 2011-07-16 ENCOUNTER — Encounter: Payer: Self-pay | Admitting: Internal Medicine

## 2011-07-16 ENCOUNTER — Telehealth: Payer: Self-pay | Admitting: Cardiology

## 2011-07-16 DIAGNOSIS — E876 Hypokalemia: Secondary | ICD-10-CM

## 2011-07-16 NOTE — Telephone Encounter (Signed)
[name]-from [facility] wants to talk to you about her meds. She has been off of her meds because she couldn't afford them please call

## 2011-07-17 NOTE — Telephone Encounter (Signed)
Phone number for [name] is incorrect.  I spoke with the pt and she lost her insurance and is going to run out of her medications in the next few weeks.  I placed a 35 day supply of crestor at the front desk.  I also had some samples of Plavix to help assist the pt with this medication.  The pt will call back with the correct phone number for [facility].

## 2011-07-17 NOTE — Telephone Encounter (Signed)
Pt wants to talk to you about phone call from [facility]

## 2011-07-18 ENCOUNTER — Other Ambulatory Visit (INDEPENDENT_AMBULATORY_CARE_PROVIDER_SITE_OTHER): Payer: Self-pay

## 2011-07-18 DIAGNOSIS — E876 Hypokalemia: Secondary | ICD-10-CM

## 2011-07-18 LAB — BASIC METABOLIC PANEL
Chloride: 103 mEq/L (ref 96–112)
GFR: 67.67 mL/min (ref >60.00)
Potassium: 3 mEq/L — ABNORMAL LOW (ref 3.5–5.1)
Sodium: 141 mEq/L (ref 135–145)

## 2011-07-19 ENCOUNTER — Encounter: Payer: Self-pay | Admitting: Internal Medicine

## 2011-07-19 ENCOUNTER — Other Ambulatory Visit: Payer: Self-pay | Admitting: Internal Medicine

## 2011-07-19 ENCOUNTER — Ambulatory Visit: Payer: PRIVATE HEALTH INSURANCE | Admitting: Physical Medicine & Rehabilitation

## 2011-07-19 DIAGNOSIS — E876 Hypokalemia: Secondary | ICD-10-CM

## 2011-07-22 LAB — ALDOSTERONE + RENIN ACTIVITY W/ RATIO: Aldosterone: 16 ng/dL

## 2011-07-23 NOTE — Telephone Encounter (Signed)
I called the pt and she gave me [facility]'s phone number.  The phone number is [phone].  I left a message for Misty Stanley to call our office.

## 2011-07-24 ENCOUNTER — Ambulatory Visit: Payer: PRIVATE HEALTH INSURANCE | Admitting: Internal Medicine

## 2011-07-25 ENCOUNTER — Telehealth: Payer: Self-pay | Admitting: Internal Medicine

## 2011-07-25 ENCOUNTER — Other Ambulatory Visit (INDEPENDENT_AMBULATORY_CARE_PROVIDER_SITE_OTHER): Payer: Self-pay

## 2011-07-25 DIAGNOSIS — E876 Hypokalemia: Secondary | ICD-10-CM

## 2011-07-25 LAB — MAGNESIUM: Magnesium: 1.1 mg/dL — ABNORMAL LOW (ref 1.5–2.5)

## 2011-07-25 LAB — POTASSIUM: Potassium: 2.6 mEq/L — CL (ref 3.5–5.1)

## 2011-07-25 NOTE — Telephone Encounter (Signed)
Lab results show low potassium of 2.6.  Per Dr Kirtland Bouchard - potassium 10 2 tabs bid and Mg oxide 1 tab bid.  Patient is aware. FYI.  Patient also was told  A lab appointment and office visit will be needed on Monday.

## 2011-07-26 NOTE — Telephone Encounter (Signed)
Opened in error

## 2011-07-29 ENCOUNTER — Ambulatory Visit: Payer: PRIVATE HEALTH INSURANCE | Admitting: Physical Medicine & Rehabilitation

## 2011-07-30 ENCOUNTER — Encounter: Payer: Self-pay | Admitting: Cardiology

## 2011-07-30 ENCOUNTER — Ambulatory Visit (INDEPENDENT_AMBULATORY_CARE_PROVIDER_SITE_OTHER): Payer: Self-pay | Admitting: Cardiology

## 2011-07-30 DIAGNOSIS — I6350 Cerebral infarction due to unspecified occlusion or stenosis of unspecified cerebral artery: Secondary | ICD-10-CM

## 2011-07-30 DIAGNOSIS — M6281 Muscle weakness (generalized): Secondary | ICD-10-CM

## 2011-07-30 DIAGNOSIS — E876 Hypokalemia: Secondary | ICD-10-CM

## 2011-07-30 DIAGNOSIS — I2510 Atherosclerotic heart disease of native coronary artery without angina pectoris: Secondary | ICD-10-CM

## 2011-07-30 DIAGNOSIS — I5032 Chronic diastolic (congestive) heart failure: Secondary | ICD-10-CM

## 2011-07-30 DIAGNOSIS — I509 Heart failure, unspecified: Secondary | ICD-10-CM

## 2011-07-30 DIAGNOSIS — I10 Essential (primary) hypertension: Secondary | ICD-10-CM

## 2011-07-30 DIAGNOSIS — R918 Other nonspecific abnormal finding of lung field: Secondary | ICD-10-CM

## 2011-07-30 DIAGNOSIS — I639 Cerebral infarction, unspecified: Secondary | ICD-10-CM

## 2011-07-30 LAB — BASIC METABOLIC PANEL
GFR: 62.17 mL/min (ref >60.00)
Potassium: 3.1 mEq/L — ABNORMAL LOW (ref 3.5–5.1)
Sodium: 141 mEq/L (ref 135–145)

## 2011-07-30 LAB — MAGNESIUM: Magnesium: 1.2 mg/dL — ABNORMAL LOW (ref 1.5–2.5)

## 2011-07-30 NOTE — Progress Notes (Signed)
HPI: She is in for follow up.  She was called by Dr. Fabian Sharp with her abnormal labs, but did not go to the ER to get reevaluated.  Her situation with her family is still not that improved overall, she talks to her mom, but not to her sister.  She is having trouble with her living arrangements, but her son is helping her quite a bit.  Her meds have been adjusted.  Using CPAP, but not all of the time.  SOB not a lot worse.  Scheduled to see Dr. Fabian Sharp soon in follow up.    Current Outpatient Prescriptions  Medication Sig Dispense Refill  . amLODipine (NORVASC) 5 MG tablet Take 1 tablet (5 mg total) by mouth daily.  30 tablet  9  . aspirin 81 MG tablet Take 81 mg by mouth daily.        . clopidogrel (PLAVIX) 75 MG tablet Take 1 tablet (75 mg total) by mouth daily.  30 tablet  6  . fluocinonide (LIDEX) 0.05 % ointment Apply topically 2 (two) times daily.  30 g  0  . hydrOXYzine (ATARAX/VISTARIL) 10 MG tablet 10 mg. Take one tablet by mouth 3 times a day and 2 at bedtime       . levothyroxine (SYNTHROID, LEVOTHROID) 137 MCG tablet TAKE ONE TABLET BY MOUTH ONE TIME DAILY  30 tablet  1  . pantoprazole (PROTONIX) 40 MG tablet Take 1 tablet (40 mg total) by mouth daily.  30 tablet  12  . potassium chloride (K-DUR) 10 MEQ tablet Take 10 mEq by mouth daily.        . QUEtiapine (SEROQUEL XR) 200 MG 24 hr tablet 200 mg at bedtime.        . rosuvastatin (CRESTOR) 5 MG tablet Take 1 tablet (5 mg total) by mouth daily.  30 tablet  9  . senna (SENOKOT) 8.6 MG tablet Take 1 tablet by mouth daily.        [name] spironolactone (ALDACTONE) 25 MG tablet Take 25 mg by mouth 2 (two) times daily.        . temazepam (RESTORIL) 7.5 MG capsule Take 1 capsule (7.5 mg total) by mouth at bedtime as needed.  30 capsule  0  . tizanidine (ZANAFLEX) 2 MG capsule Take 2 mg by mouth 2 (two) times daily.       . traMADol (ULTRAM) 50 MG tablet Take 50 mg by mouth as needed.          Allergies  Allergen Reactions  . Amoxicillin    REACTION: unspecified  . Avelox (Moxifloxacin Hcl In Nacl)   . Hydrocodone     REACTION: itching  . Penicillins     REACTION: hives  . Sulfonamide Derivatives     REACTION: ?    Past Medical History  Diagnosis Date  . CAD (coronary artery disease)     cath 09/2010: LAD stent patent, S-Int/dCFX ok, S-PDA ok, L-LAD atretic  . Hypertension   . Hx of CABG   . Diastolic dysfunction     TEE 11/2010: EF 60-65%, BAE, trivial atrial septal shunt;  right heart cath in 10/2010 with elevated R and L heart pressures and diuretic started  . HLD (hyperlipidemia)   . Hypothyroidism   . Sleep apnea   . COPD (chronic obstructive pulmonary disease)   . Pulmonary nodules     repeat CT due in 11/2011  . Acute right MCA stroke 11/07/10  . Hx MRSA infection     Chest  wall syndrome post CABG    Past Surgical History  Procedure Date  . Coronary artery bypass graft   . Debriment for infection in chest   . Chest wall reconstruction   . Bilateral knee surgery   . Bladder surgery   . Left mastoidectomy   . Hernia repair   . Cath 2012     Family History  Problem Relation Age of Onset  . COPD Mother   . Heart disease Mother   . Arthritis Mother     Rheumatoid and PMR  . Heart attack Father   . Depression Father   . Hypertension Father   . Osteoporosis Mother     Mom fractured hip  . Diabetes type II Mother     History   Social History  . Marital Status: Divorced    Spouse Name: N/A    Number of Children: 2  . Years of Education: N/A   Occupational History  . DISABLE     Social History Main Topics  . Smoking status: Former Smoker -- 1.0 packs/day for 30 years    Quit date: 06/14/2006  . Smokeless tobacco: Never Used  . Alcohol Use: No  . Drug Use: No  . Sexually Active: Not on file   Other Topics Concern  . Not on file   Social History Narrative   DivorcedAfter stroke was living with sister and mother after sister asked her to leave.Difficulties over control.Living by self  now. tranportaion difficult son  helpingEx-smokerCurrently living in  An appt. alone son lives close by and helps with the medication in the pill box.Work status regular  before got sick on short-term disabilityCollege graduate ;psychology [place] graduated May 2011Has children    ROS: Please see the HPI.  All other systems reviewed and negative.  PHYSICAL EXAM:  BP136/82  P70 R 20 No fever. General: Well developed, well nourished, somewhat depressed affect. Head:  Normocephalic and atraumatic. Neck: no JVD Lungs: Clear to auscultation and percussion. Heart: Normal S1 and S2.  No murmur, rubs or gallops. Chest:  Stable  Pulses: Pulses normal in all 4 extremities. Extremities: No clubbing or cyanosis. No edema. Neurologic: Alert and oriented x 3.  EKG:  NSR.  LVH.  Non specific T abnormality.  QTc 414 msec.   ASSESSMENT AND PLAN:

## 2011-07-30 NOTE — Progress Notes (Signed)
Patient ID: [name], female   DOB: [birth year], 55 y.o.   MRN: [id number]

## 2011-07-30 NOTE — Patient Instructions (Signed)
Your physician recommends that you schedule a follow-up appointment in: 3 months.   Your physician recommends that you continue on your current medications as directed. Please refer to the Current Medication list given to you today.  You had lab work done today 

## 2011-07-31 ENCOUNTER — Telehealth: Payer: Self-pay | Admitting: Cardiology

## 2011-07-31 DIAGNOSIS — E876 Hypokalemia: Secondary | ICD-10-CM

## 2011-07-31 MED ORDER — POTASSIUM CHLORIDE CRYS ER 10 MEQ PO TBCR: EXTENDED_RELEASE_TABLET | ORAL | Status: DC

## 2011-07-31 MED ORDER — MAGNESIUM OXIDE 400 MG PO TABS: 400.0000 mg | ORAL_TABLET | Freq: Two times a day (BID) | ORAL | Status: DC

## 2011-07-31 NOTE — Telephone Encounter (Signed)
New Msg: pt call to find out lab results. Please return pt call to discuss further.

## 2011-07-31 NOTE — Telephone Encounter (Signed)
Left message for pt to call back.  I will speak with this pt when she call back into the office.

## 2011-07-31 NOTE — Telephone Encounter (Signed)
Pt aware of results by phone.  The pt will increase Potassium Chloride take 2 tablets by mouth three times a day and Mag Ox 400mg  take one by mouth twice a day.  Pt will come into the office on Friday for a repeat BMP.

## 2011-08-02 ENCOUNTER — Other Ambulatory Visit (INDEPENDENT_AMBULATORY_CARE_PROVIDER_SITE_OTHER): Payer: Self-pay | Admitting: *Deleted

## 2011-08-02 DIAGNOSIS — E876 Hypokalemia: Secondary | ICD-10-CM

## 2011-08-02 LAB — BASIC METABOLIC PANEL
CO2: 25 mEq/L (ref 19–32)
Calcium: 9.2 mg/dL (ref 8.4–10.5)
Chloride: 111 mEq/L (ref 96–112)
Glucose, Bld: 105 mg/dL — ABNORMAL HIGH (ref 70–99)
Potassium: 3.6 mEq/L (ref 3.5–5.1)
Sodium: 145 mEq/L (ref 135–145)

## 2011-08-05 ENCOUNTER — Other Ambulatory Visit: Payer: Self-pay

## 2011-08-05 ENCOUNTER — Telehealth: Payer: Self-pay | Admitting: Gastroenterology

## 2011-08-05 DIAGNOSIS — E876 Hypokalemia: Secondary | ICD-10-CM

## 2011-08-05 NOTE — Telephone Encounter (Signed)
If procedure was scheduled for today or Wednesday please charge her. I doubt her insurance was just cancelled today.

## 2011-08-06 ENCOUNTER — Other Ambulatory Visit (INDEPENDENT_AMBULATORY_CARE_PROVIDER_SITE_OTHER): Payer: Self-pay | Admitting: *Deleted

## 2011-08-06 DIAGNOSIS — E876 Hypokalemia: Secondary | ICD-10-CM

## 2011-08-06 LAB — MAGNESIUM: Magnesium: 1.1 mg/dL — ABNORMAL LOW (ref 1.5–2.5)

## 2011-08-06 LAB — BASIC METABOLIC PANEL
BUN: 13 mg/dL (ref 6–23)
CO2: 24 mEq/L (ref 19–32)
Chloride: 111 mEq/L (ref 96–112)
Glucose, Bld: 90 mg/dL (ref 70–99)
Potassium: 4.6 mEq/L (ref 3.5–5.1)

## 2011-08-07 ENCOUNTER — Encounter: Payer: PRIVATE HEALTH INSURANCE | Admitting: Gastroenterology

## 2011-08-08 ENCOUNTER — Other Ambulatory Visit: Payer: Self-pay

## 2011-08-08 DIAGNOSIS — I2510 Atherosclerotic heart disease of native coronary artery without angina pectoris: Secondary | ICD-10-CM

## 2011-08-08 DIAGNOSIS — E876 Hypokalemia: Secondary | ICD-10-CM

## 2011-08-09 ENCOUNTER — Telehealth: Payer: Self-pay | Admitting: *Deleted

## 2011-08-09 NOTE — Telephone Encounter (Signed)
Pt is having pain in both legs and arms, muscle weakness in legs and arms, some back pain, sob all started 1 week ago. Also some chest wall pain but that has been going on for 6 years ago.   Per Dr. Fabian Sharp go to ED- Pt is staying with friends. Pt has been going to [address] and cards checking labs. She is suppose to have a repeat on Monday.

## 2011-08-10 ENCOUNTER — Encounter (HOSPITAL_COMMUNITY): Payer: Self-pay | Admitting: *Deleted

## 2011-08-10 ENCOUNTER — Emergency Department (HOSPITAL_COMMUNITY)
Admission: EM | Admit: 2011-08-10 | Discharge: 2011-08-10 | Disposition: A | Discharge status: Home / Self Care | Payer: Self-pay | Attending: Emergency Medicine | Admitting: Emergency Medicine

## 2011-08-10 ENCOUNTER — Emergency Department (HOSPITAL_COMMUNITY): Payer: Self-pay

## 2011-08-10 DIAGNOSIS — I10 Essential (primary) hypertension: Secondary | ICD-10-CM | POA: Insufficient documentation

## 2011-08-10 DIAGNOSIS — E785 Hyperlipidemia, unspecified: Secondary | ICD-10-CM | POA: Insufficient documentation

## 2011-08-10 DIAGNOSIS — M79605 Pain in left leg: Secondary | ICD-10-CM

## 2011-08-10 DIAGNOSIS — J449 Chronic obstructive pulmonary disease, unspecified: Secondary | ICD-10-CM | POA: Insufficient documentation

## 2011-08-10 DIAGNOSIS — I2510 Atherosclerotic heart disease of native coronary artery without angina pectoris: Secondary | ICD-10-CM | POA: Insufficient documentation

## 2011-08-10 DIAGNOSIS — M79604 Pain in right leg: Secondary | ICD-10-CM

## 2011-08-10 DIAGNOSIS — Z8673 Personal history of transient ischemic attack (TIA), and cerebral infarction without residual deficits: Secondary | ICD-10-CM | POA: Insufficient documentation

## 2011-08-10 DIAGNOSIS — M25529 Pain in unspecified elbow: Secondary | ICD-10-CM | POA: Insufficient documentation

## 2011-08-10 DIAGNOSIS — N390 Urinary tract infection, site not specified: Secondary | ICD-10-CM | POA: Insufficient documentation

## 2011-08-10 DIAGNOSIS — J4489 Other specified chronic obstructive pulmonary disease: Secondary | ICD-10-CM | POA: Insufficient documentation

## 2011-08-10 DIAGNOSIS — M79609 Pain in unspecified limb: Secondary | ICD-10-CM | POA: Insufficient documentation

## 2011-08-10 DIAGNOSIS — Z79899 Other long term (current) drug therapy: Secondary | ICD-10-CM | POA: Insufficient documentation

## 2011-08-10 DIAGNOSIS — Z7982 Long term (current) use of aspirin: Secondary | ICD-10-CM | POA: Insufficient documentation

## 2011-08-10 DIAGNOSIS — R29898 Other symptoms and signs involving the musculoskeletal system: Secondary | ICD-10-CM | POA: Insufficient documentation

## 2011-08-10 LAB — CBC
HCT: 33.7 % — ABNORMAL LOW (ref 36.0–46.0)
Hemoglobin: 12 g/dL (ref 12.0–15.0)
RDW: 12.7 % (ref 11.5–15.5)
WBC: 6 10*3/uL (ref 4.0–10.5)

## 2011-08-10 LAB — URINALYSIS, ROUTINE W REFLEX MICROSCOPIC
Nitrite: NEGATIVE
Specific Gravity, Urine: 1.016 (ref 1.005–1.030)
Urobilinogen, UA: 0.2 mg/dL (ref 0.0–1.0)

## 2011-08-10 LAB — DIFFERENTIAL
Basophils Absolute: 0 10*3/uL (ref 0.0–0.1)
Lymphocytes Relative: 17 % (ref 12–46)
Monocytes Absolute: 0.5 10*3/uL (ref 0.1–1.0)
Monocytes Relative: 9 % (ref 3–12)
Neutro Abs: 4.3 10*3/uL (ref 1.7–7.7)

## 2011-08-10 LAB — URINE MICROSCOPIC-ADD ON

## 2011-08-10 LAB — BASIC METABOLIC PANEL
CO2: 26 mEq/L (ref 19–32)
Chloride: 105 mEq/L (ref 96–112)
Creatinine, Ser: 0.98 mg/dL (ref 0.50–1.10)
Potassium: 4.3 mEq/L (ref 3.5–5.1)

## 2011-08-10 LAB — CARDIAC PANEL(CRET KIN+CKTOT+MB+TROPI)
CK, MB: 2.1 ng/mL (ref 0.3–4.0)
Total CK: 34 U/L (ref 7–177)

## 2011-08-10 LAB — MAGNESIUM: Magnesium: 1.3 mg/dL — ABNORMAL LOW (ref 1.5–2.5)

## 2011-08-10 MED ORDER — MAGNESIUM SULFATE 50 % IJ SOLN
2.0000 g | Freq: Once | INTRAMUSCULAR | Status: DC
  Filled 2011-08-10: qty 4

## 2011-08-10 MED ORDER — SODIUM CHLORIDE 0.9 % IV BOLUS (SEPSIS): 1000.0000 mL | Freq: Once | INTRAVENOUS | Status: DC

## 2011-08-10 MED ORDER — NITROFURANTOIN MONOHYD MACRO 100 MG PO CAPS: 100.0000 mg | ORAL_CAPSULE | Freq: Two times a day (BID) | ORAL | Status: AC

## 2011-08-10 MED ORDER — MAGNESIUM SULFATE 40 MG/ML IJ SOLN
2.0000 g | Freq: Once | INTRAMUSCULAR | Status: AC
  Administered 2011-08-10: 2 g via INTRAVENOUS
  Filled 2011-08-10: qty 50

## 2011-08-10 NOTE — ED Notes (Signed)
In and out cath was completed at 1700 and was unsuccessful

## 2011-08-10 NOTE — ED Notes (Signed)
I tried two times to in and out cath the patient. I was unsuccessful both times.

## 2011-08-10 NOTE — ED Notes (Signed)
Pt reports hx of stroke in February. Since then pt reports weight loss due to decreased appetite. At this time pt c/o weakness in bilateral legs/ arms. Pt reports weakness has worsened over past week. Pt c/o chest wall pain, which states " is normal for me". Pt is alert and oriented. NAD. Vitals stable

## 2011-08-10 NOTE — ED Notes (Signed)
Pt has multiple complaints.  Pt has chest wall pain that is chronic.  Pt has bilateral leg pain and weakness that started x 1 week ago.  Pt went to PCP on Wed and has abnormal magnesium and potassium values.  Pt states that she has a little SOB that is an ongoing issue

## 2011-08-10 NOTE — ED Notes (Signed)
Patient transported to X-ray 

## 2011-08-10 NOTE — ED Notes (Signed)
Pt undressed and placed in gown. Pt on cardiac monitor, bp cuff, and pulse ox.

## 2011-08-10 NOTE — ED Provider Notes (Signed)
History     CSN: [id number] Arrival date & time: 08/10/2011 11:41 AM   First MD Initiated Contact with Patient 08/10/11 1325      Chief Complaint  Patient presents with  . Extremity Weakness  . Leg Pain  . Pleurisy    (Consider location/radiation/quality/duration/timing/severity/associated sxs/prior treatment) HPI  Past Medical History  Diagnosis Date  . CAD (coronary artery disease)     cath 09/2010: LAD stent patent, S-Int/dCFX ok, S-PDA ok, L-LAD atretic  . Hypertension   . Hx of CABG   . Diastolic dysfunction     TEE 11/2010: EF 60-65%, BAE, trivial atrial septal shunt;  right heart cath in 10/2010 with elevated R and L heart pressures and diuretic started  . HLD (hyperlipidemia)   . Hypothyroidism   . Sleep apnea   . COPD (chronic obstructive pulmonary disease)   . Pulmonary nodules     repeat CT due in 11/2011  . Acute right MCA stroke 11/07/10  . Hx MRSA infection     Chest wall syndrome post CABG    Past Surgical History  Procedure Date  . Coronary artery bypass graft   . Debriment for infection in chest   . Chest wall reconstruction   . Bilateral knee surgery   . Bladder surgery   . Left mastoidectomy   . Hernia repair   . Cath 2012     Family History  Problem Relation Age of Onset  . COPD Mother   . Heart disease Mother   . Arthritis Mother     Rheumatoid and PMR  . Heart attack Father   . Depression Father   . Hypertension Father   . Osteoporosis Mother     Mom fractured hip  . Diabetes type II Mother     History  Substance Use Topics  . Smoking status: Former Smoker -- 1.0 packs/day for 30 years    Quit date: 06/14/2006  . Smokeless tobacco: Never Used  . Alcohol Use: No    OB History    Grav Para Term Preterm Abortions TAB SAB Ect Mult Living                  Review of Systems  Allergies  Amoxicillin; Avelox; Hydrocodone; Penicillins; and Sulfa antibiotics  Home Medications   Current Outpatient Rx  Name Route Sig Dispense  Refill  . ASPIRIN 81 MG PO TABS Oral Take 81 mg by mouth daily.      [name] CLOPIDOGREL BISULFATE 75 MG PO TABS Oral Take 1 tablet (75 mg total) by mouth daily. 30 tablet 6  . DULOXETINE HCL 30 MG PO CPEP Oral Take 30 mg by mouth daily.      [name] FLUOCINONIDE 0.05 % EX OINT Topical Apply 1 application topically 2 (two) times daily.      [name] HYDROXYZINE HCL 10 MG PO TABS Oral Take 10-20 mg by mouth 4 (four) times daily. Take one tablet by mouth 3 times a day and 2 at bedtime    . LEVOTHYROXINE SODIUM 137 MCG PO TABS Oral Take 137 mcg by mouth daily.      [name] MAGNESIUM OXIDE 400 MG PO TABS Oral Take 1 tablet (400 mg total) by mouth 2 (two) times daily. 60 tablet 6  . POTASSIUM CHLORIDE CRYS CR 10 MEQ PO TBCR  Take 2 tablets by mouth two times a day    . QUETIAPINE FUMARATE 200 MG PO TB24 Oral Take 200 mg by mouth every evening.     [name]  ROSUVASTATIN CALCIUM 5 MG PO TABS Oral Take 1 tablet (5 mg total) by mouth daily. 30 tablet 9  . SENNOSIDES 8.6 MG PO TABS Oral Take 1 tablet by mouth daily.      [name] SPIRONOLACTONE 25 MG PO TABS Oral Take 25 mg by mouth 2 (two) times daily.      [name] TIZANIDINE HCL 2 MG PO CAPS Oral Take 2 mg by mouth 2 (two) times daily.       BP 122/86  Pulse 100  Temp(Src) 98.1 F (36.7 C) (Oral)  Resp 19  SpO2 98%  Physical Exam  ED Course  Procedures (including critical care time)   Labs Reviewed  CBC  DIFFERENTIAL  BASIC METABOLIC PANEL  MAGNESIUM  CK  URINALYSIS, ROUTINE W REFLEX MICROSCOPIC   No results found.   No diagnosis found.    MDM  Patient here complaining of extremity cramping and pain. Was seen by her cardiologist on Wednesday and found to have low magnesium and potassium. She has been taking replacements however no improvement. Also about 2 weeks ago she stopped taking her tramadol. Also over the last few days patient patient has had cough and shortness of breath. She has chronic chest pain due to a staph infection after a CABG and reconstructive surgery on  her chest. She states the pain in her chest at the same but the shortness of breath and cough is new. She denies any infectious symptoms at this time. On exam there is no sign of lower extremity edema. We will get an EKG and chest x-ray, CBC, BMP, magnesium, UA to further evaluate.         Gwyneth Sprout, MD 08/10/11 [phone]

## 2011-08-10 NOTE — ED Notes (Signed)
ekg completed in triage.

## 2011-08-10 NOTE — ED Notes (Signed)
Pt has left sided facial drooping, left sided weakness from previous stroke in February. No new deficits noted.

## 2011-08-10 NOTE — ED Provider Notes (Signed)
History    55yF with multiple complaints. B/l leg pain. Diffuse from hips to toes. Denies trauma. NO swelling no fever or chills. Gradual onset about a week ago. Intermittent withotu exacerbating or relieving factors. No n/v. No acute abdominal or back pain. No n/v. Chest pain which "is an antique." Denies acute change in her CP. No rash. No recent med changes. Denies hx of blood clot. Recently saw pcp and told had low potassium and magnesium. Also c/o b/l elbow pain. No swelling. No trauma.  CSN: [id number] Arrival date & time: 08/10/2011 11:41 AM   First MD Initiated Contact with Patient 08/10/11 1325      Chief Complaint  Patient presents with  . Extremity Weakness  . Leg Pain  . Pleurisy    (Consider location/radiation/quality/duration/timing/severity/associated sxs/prior treatment) HPI  Past Medical History  Diagnosis Date  . CAD (coronary artery disease)     cath 09/2010: LAD stent patent, S-Int/dCFX ok, S-PDA ok, L-LAD atretic  . Hypertension   . Hx of CABG   . Diastolic dysfunction     TEE 11/2010: EF 60-65%, BAE, trivial atrial septal shunt;  right heart cath in 10/2010 with elevated R and L heart pressures and diuretic started  . HLD (hyperlipidemia)   . Hypothyroidism   . Sleep apnea   . COPD (chronic obstructive pulmonary disease)   . Pulmonary nodules     repeat CT due in 11/2011  . Acute right MCA stroke 11/07/10  . Hx MRSA infection     Chest wall syndrome post CABG    Past Surgical History  Procedure Date  . Coronary artery bypass graft   . Debriment for infection in chest   . Chest wall reconstruction   . Bilateral knee surgery   . Bladder surgery   . Left mastoidectomy   . Hernia repair   . Cath 2012     Family History  Problem Relation Age of Onset  . COPD Mother   . Heart disease Mother   . Arthritis Mother     Rheumatoid and PMR  . Heart attack Father   . Depression Father   . Hypertension Father   . Osteoporosis Mother     Mom fractured hip   . Diabetes type II Mother     History  Substance Use Topics  . Smoking status: Former Smoker -- 1.0 packs/day for 30 years    Quit date: 06/14/2006  . Smokeless tobacco: Never Used  . Alcohol Use: No    OB History    Grav Para Term Preterm Abortions TAB SAB Ect Mult Living                  Review of Systems   Review of symptoms negative unless otherwise noted in HPI.   Allergies  Amoxicillin; Avelox; Hydrocodone; Penicillins; and Sulfa antibiotics  Home Medications   Current Outpatient Rx  Name Route Sig Dispense Refill  . ASPIRIN 81 MG PO TABS Oral Take 81 mg by mouth daily.      [name] CLOPIDOGREL BISULFATE 75 MG PO TABS Oral Take 1 tablet (75 mg total) by mouth daily. 30 tablet 6  . DULOXETINE HCL 30 MG PO CPEP Oral Take 30 mg by mouth daily.      [name] FLUOCINONIDE 0.05 % EX OINT Topical Apply 1 application topically 2 (two) times daily.      [name] HYDROXYZINE HCL 10 MG PO TABS Oral Take 10-20 mg by mouth 4 (four) times daily. Take one tablet by  mouth 3 times a day and 2 at bedtime    . LEVOTHYROXINE SODIUM 137 MCG PO TABS Oral Take 137 mcg by mouth daily.      [name] MAGNESIUM OXIDE 400 MG PO TABS Oral Take 1 tablet (400 mg total) by mouth 2 (two) times daily. 60 tablet 6  . POTASSIUM CHLORIDE CRYS CR 10 MEQ PO TBCR  Take 2 tablets by mouth two times a day    . QUETIAPINE FUMARATE 200 MG PO TB24 Oral Take 200 mg by mouth every evening.     [name] ROSUVASTATIN CALCIUM 5 MG PO TABS Oral Take 1 tablet (5 mg total) by mouth daily. 30 tablet 9  . SENNOSIDES 8.6 MG PO TABS Oral Take 1 tablet by mouth daily.      [name] SPIRONOLACTONE 25 MG PO TABS Oral Take 25 mg by mouth 2 (two) times daily.      [name] TIZANIDINE HCL 2 MG PO CAPS Oral Take 2 mg by mouth 2 (two) times daily.       BP 120/80  Pulse 76  Temp(Src) 98.1 F (36.7 C) (Oral)  Resp 24  SpO2 98%  Physical Exam  Nursing note and vitals reviewed. Constitutional: She is oriented to person, place, and time. She appears well-developed and  well-nourished. No distress.  HENT:  Head: Normocephalic and atraumatic.  Right Ear: External ear normal.  Left Ear: External ear normal.  Eyes: Conjunctivae are normal. Pupils are equal, round, and reactive to light. Right eye exhibits no discharge. Left eye exhibits no discharge. No scleral icterus.  Neck: Neck supple.  Cardiovascular: Normal rate, regular rhythm and normal heart sounds.  Exam reveals no gallop and no friction rub.   No murmur heard. Pulmonary/Chest: Effort normal and breath sounds normal. No respiratory distress.  Abdominal: Soft. She exhibits no distension. There is no tenderness.  Musculoskeletal: She exhibits no edema and no tenderness.  Neurological: She is alert and oriented to person, place, and time. No cranial nerve deficit.       Mild decrease in strength L upper/lower extremities otherwise nonfocal neuro exam.  Skin: Skin is warm and dry. No erythema. No pallor.  Psychiatric: She has a normal mood and affect. Her behavior is normal. Thought content normal.    ED Course  Procedures (including critical care time)  Labs Reviewed  CBC - Abnormal; Notable for the following:    HCT 33.7 (*)    All other components within normal limits  BASIC METABOLIC PANEL - Abnormal; Notable for the following:    GFR calc non Af Amer 65 (*)    GFR calc Af Amer 75 (*)    All other components within normal limits  MAGNESIUM - Abnormal; Notable for the following:    Magnesium 1.3 (*)    All other components within normal limits  DIFFERENTIAL  CK  CARDIAC PANEL(CRET KIN+CKTOT+MB+TROPI)  URINALYSIS, ROUTINE W REFLEX MICROSCOPIC   Dg Chest 2 View  08/10/2011  *RADIOLOGY REPORT*  Clinical Data: Shortness of breath.  CHEST - 2 VIEW  Comparison: Chest x-ray 06/19/2011.  Findings: The cardiac silhouette, mediastinal and hilar contours are within normal limits and stable.  There are chronic bronchitic type lung changes but no infiltrates, edema or effusions.  The bony thorax is  intact and stable.  IMPRESSION: Chronic bronchitic type lung changes but no acute pulmonary findings.  Original Report Authenticated By: P. Loralie Champagne, M.D.     1. Hypomagnesemia   2. Leg pain, bilateral   3. Elbow  pain       MDM  55yF with intermittent b/l leg and elbow pain. Exam nonsuggestive. Mild L sided weakness which is chronic from prior cva. Non focal neuro exam otherwise. Doubt dvt. No trauma. Doubt b/l septic hips and elbows. hypoMag but doubt etiology of symptoms. repleted in ED. Pt on potassium and mag supplementation and told to increase mag for next couple days and fu back up with pcp.         Raeford Razor, MD 08/10/11 1754

## 2011-08-10 NOTE — ED Notes (Signed)
Patient is unable to void at present. 

## 2011-08-10 NOTE — ED Notes (Signed)
Pt passed stroke swallow screen. Temp 98.4, lung sounds clear bilaterally.

## 2011-08-10 NOTE — ED Notes (Signed)
3 attempts for In and Out Cath. Unable to get any urine.

## 2011-08-12 ENCOUNTER — Other Ambulatory Visit (INDEPENDENT_AMBULATORY_CARE_PROVIDER_SITE_OTHER): Payer: Self-pay | Admitting: *Deleted

## 2011-08-12 ENCOUNTER — Other Ambulatory Visit: Payer: Self-pay | Admitting: Internal Medicine

## 2011-08-12 DIAGNOSIS — E876 Hypokalemia: Secondary | ICD-10-CM

## 2011-08-12 LAB — BASIC METABOLIC PANEL
CO2: 27 mEq/L (ref 19–32)
Chloride: 106 mEq/L (ref 96–112)
Glucose, Bld: 88 mg/dL (ref 70–99)
Potassium: 3.5 mEq/L (ref 3.5–5.1)
Sodium: 141 mEq/L (ref 135–145)

## 2011-08-12 NOTE — Telephone Encounter (Signed)
sched is full. will have to work her in end of  Clinic this Thursday dec 6th   at 11 30.am  Also please  Block/ save the 2 remaining slots for triage  Or sick kids only on that day.  thanks

## 2011-08-12 NOTE — Telephone Encounter (Signed)
Pt went to ED on Saturday for sob, legs and arm pain. Pt was told to follow up with doc panosh in 2 day. Pt is somewhat better. Where can I work pt in?

## 2011-08-13 NOTE — Telephone Encounter (Signed)
Pt aware and appt made.

## 2011-08-15 ENCOUNTER — Ambulatory Visit (INDEPENDENT_AMBULATORY_CARE_PROVIDER_SITE_OTHER): Payer: Self-pay | Admitting: Internal Medicine

## 2011-08-15 ENCOUNTER — Encounter: Payer: Self-pay | Admitting: Internal Medicine

## 2011-08-15 DIAGNOSIS — R071 Chest pain on breathing: Secondary | ICD-10-CM

## 2011-08-15 DIAGNOSIS — R0602 Shortness of breath: Secondary | ICD-10-CM

## 2011-08-15 DIAGNOSIS — E876 Hypokalemia: Secondary | ICD-10-CM

## 2011-08-15 DIAGNOSIS — I25810 Atherosclerosis of coronary artery bypass graft(s) without angina pectoris: Secondary | ICD-10-CM

## 2011-08-15 DIAGNOSIS — Z91120 Patient's intentional underdosing of medication regimen due to financial hardship: Secondary | ICD-10-CM

## 2011-08-15 DIAGNOSIS — G4733 Obstructive sleep apnea (adult) (pediatric): Secondary | ICD-10-CM

## 2011-08-15 DIAGNOSIS — G4730 Sleep apnea, unspecified: Secondary | ICD-10-CM

## 2011-08-15 DIAGNOSIS — F4321 Adjustment disorder with depressed mood: Secondary | ICD-10-CM

## 2011-08-15 DIAGNOSIS — M6281 Muscle weakness (generalized): Secondary | ICD-10-CM

## 2011-08-15 DIAGNOSIS — Y639 Failure in dosage during unspecified surgical and medical care: Secondary | ICD-10-CM

## 2011-08-15 NOTE — Patient Instructions (Signed)
Take  Magnesium 1 twice a day as on med list  Continue  Same potassium 2 twice a day Repeat BMP magnesium in 1 week or so and cbc diff.  Then plan follow up.

## 2011-08-16 ENCOUNTER — Encounter: Payer: Self-pay | Admitting: Internal Medicine

## 2011-08-16 NOTE — Progress Notes (Signed)
  Subjective:    Patient ID: [name], female    DOB: 27-Oct-1955, 55 y.o.   MRN: [id number]  HPI Pt comes in for fu from ed visit for sob and feeling weak.  c xray more c/w chronic bronchitis and has had uti recently rx with macrobid .   Mg was again low and was repleted with K  Only taking 1 magnesium and 40 me of potassium .  She is off the spironolactone cause of cost (? 70 $)  No insurance  CWP ongoing  Used patches in past  .  Mood ran out of cymbalta  Got appt time wrong at behavioral health.  SOB about the same no change and no progressive cough or fever . Had NAD on x ray in ed. Was rx for uti  With macrobid  Cannot find culture  cx appt with uro cause of cost  At present and no Hinsurance   Review of Systems NO v or d able to keep meds down no fever change in vision bleeding  Depression ongoing  Past history family history social history reviewed in the electronic medical record.     Objective:   Physical Exam WD depressed appearing but verbal in nad quiet respirations. Neck: Supple without adenopathy or masses or bruits Chest:  Clear to A&P without wheezes rales or rhonchi CV:  S1-S2 no gallops or murmurs peripheral perfusion is normal No clubbing cyanosis or edema Neruo walks with cane and paresis mild tremor left . Skin no bruising or bleeding Reviewed labs with pt done at cardiology Mg up to 1.6 range and k 3.5          Assessment & Plan:  Sob no change  Low mg and k  Recurrent no active vomiting  Has stopped the ppi incase   Has appt with but has no insurance at this point.  Has had neg pra/ aldo ratio normal x 2 in am . No confirmed hyper also. Unsure why she has magnesium wasting at this time but continue orals and increase to 2 per day as on med list.  Stopped th spirono cause of cost . Others told her to stop the mg in the past and then mg level went down.  Lack of clarity about doing of med but currently seems stable.  Take 2 mg per day ( was only taking 1)  continue potassium  Has dropped from 4.3 to 3.5 in less than a week.   Depression pain  Out of cymbalta  For a week  Poss contributing samples x 56 given in themeantime. Restart this. Hesitant to use any narcotic or mind altering med  in her situation at this time.  Encouraged to get help to get disability  And thus health insurance benefits.

## 2011-08-21 ENCOUNTER — Other Ambulatory Visit (INDEPENDENT_AMBULATORY_CARE_PROVIDER_SITE_OTHER): Payer: Self-pay

## 2011-08-21 DIAGNOSIS — E876 Hypokalemia: Secondary | ICD-10-CM

## 2011-08-21 LAB — CBC WITH DIFFERENTIAL/PLATELET
Basophils Absolute: 0 10*3/uL (ref 0.0–0.1)
HCT: 36 % (ref 36.0–46.0)
Hemoglobin: 12.4 g/dL (ref 12.0–15.0)
Lymphs Abs: 1 10*3/uL (ref 0.7–4.0)
MCV: 88.3 fl (ref 78.0–100.0)
Monocytes Absolute: 0.3 10*3/uL (ref 0.1–1.0)
Monocytes Relative: 6.6 % (ref 3.0–12.0)
Neutro Abs: 3.2 10*3/uL (ref 1.4–7.7)
Platelets: 225 10*3/uL (ref 150.0–400.0)
RDW: 13 % (ref 11.5–14.6)

## 2011-08-21 LAB — BASIC METABOLIC PANEL
BUN: 16 mg/dL (ref 6–23)
CO2: 27 mEq/L (ref 19–32)
Chloride: 106 mEq/L (ref 96–112)
Glucose, Bld: 102 mg/dL — ABNORMAL HIGH (ref 70–99)
Potassium: 4.8 mEq/L (ref 3.5–5.1)
Sodium: 140 mEq/L (ref 135–145)

## 2011-08-22 ENCOUNTER — Other Ambulatory Visit: Payer: Self-pay | Admitting: Internal Medicine

## 2011-08-22 DIAGNOSIS — E876 Hypokalemia: Secondary | ICD-10-CM

## 2011-08-22 NOTE — Progress Notes (Signed)
Quick Note:  Left message on machine about results. ______ 

## 2011-08-30 ENCOUNTER — Other Ambulatory Visit: Payer: Self-pay

## 2011-08-30 DIAGNOSIS — E876 Hypokalemia: Secondary | ICD-10-CM

## 2011-09-03 NOTE — Assessment & Plan Note (Signed)
Controlled at present.  

## 2011-09-03 NOTE — Assessment & Plan Note (Signed)
Slowly improving from this.  Remains on DAPT.

## 2011-09-03 NOTE — Assessment & Plan Note (Signed)
In part related to OSA.

## 2011-09-03 NOTE — Assessment & Plan Note (Signed)
Repeat CT due in March 2013

## 2011-09-03 NOTE — Assessment & Plan Note (Signed)
Possibly due to low K and Mg.  Values very low the other day.  Recheck again today.

## 2011-09-03 NOTE — Assessment & Plan Note (Signed)
No recurrent chest pain at this time.  

## 2011-09-03 NOTE — Assessment & Plan Note (Signed)
Accompanied by low Mg.  Recheck values today, and replace appropriately.

## 2011-09-04 ENCOUNTER — Telehealth: Payer: Self-pay | Admitting: Cardiology

## 2011-09-04 ENCOUNTER — Other Ambulatory Visit: Payer: Self-pay | Admitting: *Deleted

## 2011-09-04 NOTE — Telephone Encounter (Signed)
I spoke with the pt and she is not coming into the office today for lab work.  The pt has not taken her potassium supplement for almost a week.  The pt said she did not have the money to purchase med or transportation to pick up her potassium refill.  The pt is going to try and get her son to pick up her potassium today.  The pt's normal dosage is take 2 tablets twice a day.  I instructed the pt to take tonight when she gets her medication and in the morning and then go back to her previous dose.  I further instructed the pt that she needs to come into the office to have her lab checked this week.  The pt said she would try and get transportation to have her lab checked on Friday.  I have rescheduled her lab work to Friday.

## 2011-09-04 NOTE — Telephone Encounter (Signed)
New message:  Pt called and is out of potassium for 1 week and will not come for lab today.  How many days should she take before coming in for potassium check.

## 2011-09-06 ENCOUNTER — Other Ambulatory Visit: Payer: Self-pay | Admitting: *Deleted

## 2011-09-06 NOTE — Progress Notes (Signed)
Addended by: Micki Riley C on: 09/06/2011 11:49 AM   Modules accepted: Orders

## 2011-09-11 ENCOUNTER — Other Ambulatory Visit: Payer: Self-pay | Admitting: *Deleted

## 2011-09-11 ENCOUNTER — Telehealth: Payer: Self-pay | Admitting: Cardiology

## 2011-09-11 ENCOUNTER — Other Ambulatory Visit: Payer: Self-pay | Admitting: Internal Medicine

## 2011-09-11 NOTE — Telephone Encounter (Signed)
New msg Pt wants to know if she soul get samples of crestor

## 2011-09-11 NOTE — Telephone Encounter (Signed)
Pt was notified that all we have are 21 days of Crestor samples.  These were left at the front desk for her.  She also wanted to report that she will come in tomorrow for labs.

## 2011-09-12 ENCOUNTER — Other Ambulatory Visit (INDEPENDENT_AMBULATORY_CARE_PROVIDER_SITE_OTHER): Payer: Self-pay | Admitting: *Deleted

## 2011-09-12 DIAGNOSIS — E876 Hypokalemia: Secondary | ICD-10-CM

## 2011-09-12 LAB — BASIC METABOLIC PANEL
CO2: 27 mEq/L (ref 19–32)
Calcium: 9.8 mg/dL (ref 8.4–10.5)
Creatinine, Ser: 1.4 mg/dL — ABNORMAL HIGH (ref 0.4–1.2)
GFR: 41.66 mL/min — ABNORMAL LOW (ref >60.00)
Glucose, Bld: 104 mg/dL — ABNORMAL HIGH (ref 70–99)
Sodium: 140 mEq/L (ref 135–145)

## 2011-09-13 ENCOUNTER — Telehealth: Payer: Self-pay

## 2011-09-13 DIAGNOSIS — K219 Gastro-esophageal reflux disease without esophagitis: Secondary | ICD-10-CM

## 2011-09-13 DIAGNOSIS — E876 Hypokalemia: Secondary | ICD-10-CM

## 2011-09-13 NOTE — Telephone Encounter (Signed)
The pt's BMP is in her chart (it did not result at the same time as Magnesium).  The pt will decrease Mag Ox to once a day and decrease potassium to take two in the morning (previously two by mouth twice a day). The pt will come into the office on 09/19/11 for a repeat BMP.  The pt also wants to know what she can take for reflux.  She has been taking MOM and I recommended that she take TUMS as needed. I will forward this note to Dr Riley Kill to review.

## 2011-09-16 NOTE — Telephone Encounter (Signed)
Per Dr Riley Kill the pt can take Pepcid 20mg  daily.  I made the pt aware of this information.

## 2011-09-20 ENCOUNTER — Other Ambulatory Visit (INDEPENDENT_AMBULATORY_CARE_PROVIDER_SITE_OTHER): Payer: Self-pay | Admitting: *Deleted

## 2011-09-20 DIAGNOSIS — E876 Hypokalemia: Secondary | ICD-10-CM

## 2011-09-20 LAB — BASIC METABOLIC PANEL
BUN: 22 mg/dL (ref 6–23)
CO2: 28 mEq/L (ref 19–32)
Calcium: 9.6 mg/dL (ref 8.4–10.5)
Chloride: 105 mEq/L (ref 96–112)
Creatinine, Ser: 1.1 mg/dL (ref 0.4–1.2)
GFR: 58.06 mL/min — ABNORMAL LOW (ref >60.00)
Glucose, Bld: 88 mg/dL (ref 70–99)
Potassium: 3.7 mEq/L (ref 3.5–5.1)
Sodium: 141 mEq/L (ref 135–145)

## 2011-09-21 ENCOUNTER — Other Ambulatory Visit: Payer: Self-pay | Admitting: Internal Medicine

## 2011-09-23 ENCOUNTER — Telehealth: Payer: Self-pay

## 2011-09-23 DIAGNOSIS — E876 Hypokalemia: Secondary | ICD-10-CM

## 2011-09-23 NOTE — Telephone Encounter (Signed)
I spoke with the pt and she will have a BMP and Magnesium rechecked on 09/30/11.  Samples of Crestor placed at the front desk. The pt has not tried Pepcid at this time.

## 2011-09-30 ENCOUNTER — Other Ambulatory Visit: Payer: Self-pay | Admitting: *Deleted

## 2011-10-02 ENCOUNTER — Encounter: Payer: Self-pay | Admitting: Internal Medicine

## 2011-10-04 ENCOUNTER — Ambulatory Visit (INDEPENDENT_AMBULATORY_CARE_PROVIDER_SITE_OTHER): Payer: Self-pay | Admitting: *Deleted

## 2011-10-04 DIAGNOSIS — I25810 Atherosclerosis of coronary artery bypass graft(s) without angina pectoris: Secondary | ICD-10-CM

## 2011-10-04 DIAGNOSIS — G43909 Migraine, unspecified, not intractable, without status migrainosus: Secondary | ICD-10-CM

## 2011-10-04 DIAGNOSIS — E785 Hyperlipidemia, unspecified: Secondary | ICD-10-CM

## 2011-10-04 LAB — BASIC METABOLIC PANEL
Calcium: 9.5 mg/dL (ref 8.4–10.5)
GFR: 52.26 mL/min — ABNORMAL LOW (ref >60.00)
Potassium: 3.9 mEq/L (ref 3.5–5.1)
Sodium: 139 mEq/L (ref 135–145)

## 2011-10-04 LAB — MAGNESIUM: Magnesium: 1.7 mg/dL (ref 1.5–2.5)

## 2011-10-11 ENCOUNTER — Encounter: Payer: Self-pay | Admitting: Internal Medicine

## 2011-10-11 ENCOUNTER — Ambulatory Visit (INDEPENDENT_AMBULATORY_CARE_PROVIDER_SITE_OTHER): Payer: Self-pay | Admitting: Internal Medicine

## 2011-10-11 VITALS — BP 120/80 | HR 78 | Temp 97.8°F | Wt 154.0 lb

## 2011-10-11 DIAGNOSIS — R300 Dysuria: Secondary | ICD-10-CM

## 2011-10-11 DIAGNOSIS — I2510 Atherosclerotic heart disease of native coronary artery without angina pectoris: Secondary | ICD-10-CM

## 2011-10-11 DIAGNOSIS — Z8673 Personal history of transient ischemic attack (TIA), and cerebral infarction without residual deficits: Secondary | ICD-10-CM

## 2011-10-11 DIAGNOSIS — E876 Hypokalemia: Secondary | ICD-10-CM

## 2011-10-11 DIAGNOSIS — R339 Retention of urine, unspecified: Secondary | ICD-10-CM

## 2011-10-11 DIAGNOSIS — I509 Heart failure, unspecified: Secondary | ICD-10-CM

## 2011-10-11 DIAGNOSIS — I5032 Chronic diastolic (congestive) heart failure: Secondary | ICD-10-CM

## 2011-10-11 LAB — POCT URINALYSIS DIPSTICK
Glucose, UA: NEGATIVE
Nitrite, UA: NEGATIVE
Urobilinogen, UA: 0.2

## 2011-10-11 MED ORDER — NITROFURANTOIN MONOHYD MACRO 100 MG PO CAPS: 100.0000 mg | ORAL_CAPSULE | Freq: Two times a day (BID) | ORAL | Status: DC

## 2011-10-11 NOTE — Patient Instructions (Signed)
Will notify you  of labs when available. macrobid   Keep [place] informed  About how you are doing.

## 2011-10-12 ENCOUNTER — Encounter: Payer: Self-pay | Admitting: Internal Medicine

## 2011-10-12 NOTE — Progress Notes (Signed)
  Subjective:    Patient ID: [name], female    DOB: 26-Apr-1956, 56 y.o.   MRN: [id number]  HPI Patient comes in today for SDA  For acute problem evaluation. Reported as possible UTI with increase frequency some dysuria a lot like previous uti. No fever or chills.   Had to move back to sisters home far away because of her financial destitution and no insurance. Acceptance to medicare etc still pending.    ON a number of different meds per psych and rehab . No new dx recently Potassium and magnesium supplements and spironolactone  Continue and  Got last labs per dr Riley Kill.  No reg vomiting diarrhea per pat.  She has delayed renal consult because of cost at this point but no recent hospitalization.    Review of Systems Non fever still had anterior cp and sob but no wheezing edema  Itches all the time and takes hydroxyzine at night with her other meds  And now on seroquel x r   Past history family history social history reviewed in the electronic medical record.     Objective:   Physical Exam WD ambulatory by cane forlorn looking in and alert verbal and articulate. Neck no mass or jvd Chest:  Clear to A&P without wheezes rales or rhonchi CV:  S1-S2 no gallops or murmurs peripheral perfusion is normal No flank pain  Skin: No clubbing cyanosis or edema rough red hands some excoriation  UA + leuk 1+ protein       Assessment & Plan:  Change in urinary sx x UTI with hx of same   Ur cx done reviewed record and has allergy to many  meds but tolerated macrobid in the past without problem  Financial social situation   Discussed  Dependent on family and other transportation at this point.   Hypokalemia and hypomagnesia    On supplementation   With low dose spironolactone  Now coordinated per Dr Louretta Shorten office  Better   Disc last lab with her but should expect call from their office.  Unclear cause of this metabolic situation but appears stable a t this point.

## 2011-10-13 LAB — URINE CULTURE: Colony Count: >=100000

## 2011-10-13 NOTE — Progress Notes (Signed)
Quick Note:  Tell pt urine culture showed multiple bacteria but not predominant germ. Cannot tell if she has UTI. But she probably does ... Finish the antibiotic And if not getting better then Plan repeat culture trying to get a good clean catch urine. ______

## 2011-10-15 NOTE — Progress Notes (Signed)
Quick Note:  Left message on machine about results. ______ 

## 2011-10-25 ENCOUNTER — Telehealth: Payer: Self-pay | Admitting: *Deleted

## 2011-10-25 NOTE — Telephone Encounter (Signed)
Left message to call back in regards to pt's hip pain appt.

## 2011-10-28 NOTE — Telephone Encounter (Signed)
Left message to call back  

## 2011-10-28 NOTE — Progress Notes (Signed)
Quick Note:  Left message to call back. ______ 

## 2011-10-29 ENCOUNTER — Ambulatory Visit: Payer: Self-pay | Admitting: Internal Medicine

## 2011-10-29 DIAGNOSIS — Z0289 Encounter for other administrative examinations: Secondary | ICD-10-CM

## 2011-10-30 ENCOUNTER — Encounter: Payer: Self-pay | Admitting: Cardiology

## 2011-10-30 ENCOUNTER — Ambulatory Visit (INDEPENDENT_AMBULATORY_CARE_PROVIDER_SITE_OTHER): Payer: Self-pay | Admitting: Cardiology

## 2011-10-30 DIAGNOSIS — E876 Hypokalemia: Secondary | ICD-10-CM

## 2011-10-30 DIAGNOSIS — I509 Heart failure, unspecified: Secondary | ICD-10-CM

## 2011-10-30 DIAGNOSIS — I2510 Atherosclerotic heart disease of native coronary artery without angina pectoris: Secondary | ICD-10-CM

## 2011-10-30 DIAGNOSIS — I10 Essential (primary) hypertension: Secondary | ICD-10-CM

## 2011-10-30 DIAGNOSIS — I6350 Cerebral infarction due to unspecified occlusion or stenosis of unspecified cerebral artery: Secondary | ICD-10-CM

## 2011-10-30 DIAGNOSIS — I639 Cerebral infarction, unspecified: Secondary | ICD-10-CM

## 2011-10-30 DIAGNOSIS — I5032 Chronic diastolic (congestive) heart failure: Secondary | ICD-10-CM

## 2011-10-30 LAB — BASIC METABOLIC PANEL
BUN: 17 mg/dL (ref 6–23)
Creatinine, Ser: 1.1 mg/dL (ref 0.4–1.2)
GFR: 55.58 mL/min — ABNORMAL LOW (ref >60.00)
Glucose, Bld: 89 mg/dL (ref 70–99)
Potassium: 3.9 mEq/L (ref 3.5–5.1)

## 2011-10-30 NOTE — Assessment & Plan Note (Signed)
Current BP is ok.

## 2011-10-30 NOTE — Assessment & Plan Note (Signed)
Seems improved at the present time.  No current symptoms except still some issue with her left hand.

## 2011-10-30 NOTE — Assessment & Plan Note (Signed)
Will recheck.  See note on hypokalemia.

## 2011-10-30 NOTE — Progress Notes (Signed)
HPI:  [name] is here in follow up.  She is stable.  She was living with her sister, but that did not go so well, so for now she is at the shelter at [facility].  I am a bit worried about her, as she continues to seem down.  She does have some shortness of breath, but has picked up some weight, is less active, and currently has not been using her CPAP machine because of her mask.  Regarding her stroke, her speech seems much improved.  However, she remains with difficulty in using her left hand which does not allow her to type well.    Current Outpatient Prescriptions  Medication Sig Dispense Refill  . aspirin 81 MG tablet Take 81 mg by mouth daily.        . clopidogrel (PLAVIX) 75 MG tablet Take 1 tablet (75 mg total) by mouth daily.  30 tablet  6  . DULoxetine (CYMBALTA) 30 MG capsule Take 30 mg by mouth daily.        . famotidine (PEPCID) 20 MG tablet Take 1 tablet (20 mg total) by mouth daily.      . fluocinonide ointment (LIDEX) 0.05 % Apply 1 application topically 2 (two) times daily.        . hydrOXYzine (ATARAX/VISTARIL) 10 MG tablet Take 10-20 mg by mouth 4 (four) times daily. Take one tablet by mouth 3 times a day and 2 at bedtime      . magnesium oxide (MAG-OX) 400 MG tablet Take 1 tablet (400 mg total) by mouth daily.      . potassium chloride (K-DUR,KLOR-CON) 10 MEQ tablet Take 2 tablets (20 mEq total) by mouth daily.      . QUEtiapine (SEROQUEL XR) 200 MG 24 hr tablet Take 200 mg by mouth every evening. 300mg       . rosuvastatin (CRESTOR) 5 MG tablet Take 1 tablet (5 mg total) by mouth daily.  30 tablet  9  . senna (SENOKOT) 8.6 MG tablet Take 1 tablet by mouth daily.        [name] spironolactone (ALDACTONE) 25 MG tablet TAKE ONE TABLET BY MOUTH ONE TIME DAILY  30 tablet  0  . tizanidine (ZANAFLEX) 2 MG capsule Take 2 mg by mouth 2 (two) times daily.       [name] levothyroxine (SYNTHROID, LEVOTHROID) 137 MCG tablet TAKE ONE TABLET BY MOUTH ONE TIME DAILY  30 tablet  0    Allergies    Allergen Reactions  . Amoxicillin Hives  . Avelox (Moxifloxacin Hcl In Nacl) Other (See Comments)    Mental breakdown.  [name] Hydrocodone Itching  . Penicillins Hives  . Sulfa Antibiotics Other (See Comments)    unknown    Past Medical History  Diagnosis Date  . CAD (coronary artery disease)     cath 09/2010: LAD stent patent, S-Int/dCFX ok, S-PDA ok, L-LAD atretic  . Hypertension   . Hx of CABG   . Diastolic dysfunction     TEE 11/2010: EF 60-65%, BAE, trivial atrial septal shunt;  right heart cath in 10/2010 with elevated R and L heart pressures and diuretic started  . HLD (hyperlipidemia)   . Hypothyroidism   . Sleep apnea   . COPD (chronic obstructive pulmonary disease)   . Pulmonary nodules     repeat CT due in 11/2011  . Acute right MCA stroke 11/07/10  . Hx MRSA infection     Chest wall syndrome post CABG    Past Surgical  History  Procedure Date  . Coronary artery bypass graft   . Debriment for infection in chest   . Chest wall reconstruction   . Bilateral knee surgery   . Bladder surgery   . Left mastoidectomy   . Hernia repair   . Cath 2012     Family History  Problem Relation Age of Onset  . COPD Mother   . Heart disease Mother   . Arthritis Mother     Rheumatoid and PMR  . Heart attack Father   . Depression Father   . Hypertension Father   . Osteoporosis Mother     Mom fractured hip  . Diabetes type II Mother     History   Social History  . Marital Status: Divorced    Spouse Name: N/A    Number of Children: 2  . Years of Education: N/A   Occupational History  . DISABLE     Social History Main Topics  . Smoking status: Former Smoker -- 1.0 packs/day for 30 years    Quit date: 06/14/2006  . Smokeless tobacco: Never Used  . Alcohol Use: No  . Drug Use: No  . Sexually Active: Not on file   Other Topics Concern  . Not on file   Social History Narrative   DivorcedAfter stroke was living with sister and mother after sister asked her to  leave.Difficulties over control. Now having to move back in with sister in a difficult situation.Living by self now. tranportaion difficult son  helpingEx-smokerWas living at friends and is near homeless son lives  helps with the medication in the pill box.Work status regular  before got sick on short-term disability now lost insurance  And denied ssi reapplyingCollege graduate ;psychology [place] graduated May 2011Has children    ROS: Please see the HPI.  All other systems reviewed and negative.  PHYSICAL EXAM:  BP 112/74  Pulse 68  Ht 5\' 4"  (1.626 m)  Wt 160 lb 12.8 oz (72.938 kg)  BMI 27.60 kg/m2  General: Well developed, well nourished, in no acute distress. Head:  Normocephalic and atraumatic. Neck: no JVD Lungs: Clear to auscultation and percussion. Heart: Normal S1 and S2. Pos S4 gallop.   Pulses: Pulses normal in all 4 extremities. Extremities: No clubbing or cyanosis. No edema. Neurologic: Alert and oriented x 3.  EKG:  NSR.  Nonspecific T wave abnormality.    ASSESSMENT AND PLAN:

## 2011-10-30 NOTE — Patient Instructions (Addendum)
Your physician recommends that you have lab work today:  BMP and Magnesium  Your physician recommends that you schedule a follow-up appointment in: 4 MONTHS  Your physician recommends that you continue on your current medications as directed. Please refer to the Current Medication list given to you today.

## 2011-10-30 NOTE — Assessment & Plan Note (Signed)
Will recheck.  Thought to be made worse by hypomagnesemia. This was thought exacerbated by PPI.

## 2011-10-30 NOTE — Assessment & Plan Note (Signed)
Noted at [facility] to have elevated pressures.  Suspect she had diastolic heart failure and that this is exacerbated by incresaed weight, untreated OSA, and reduced activity.  Her SOB was thought to be a direct issue related to this.

## 2011-10-31 ENCOUNTER — Encounter: Payer: Self-pay | Admitting: *Deleted

## 2011-10-31 ENCOUNTER — Telehealth: Payer: Self-pay | Admitting: *Deleted

## 2011-10-31 NOTE — Telephone Encounter (Signed)
Pt called back saying that still having hip pain but has lost her cain and she thinks this is the problem. She is having a lot of pressure in the abd., odor and trying to get a stream. Pt doesn't know when she can get back over here until next week. Pt is going to call us next week and I told her that we would try to work her in when she called [place].  She is not trusting [facility] because they left her stranded at Dr. Rosalyn Charters office yesterday. Pt is at DSS now to try to get help.

## 2011-10-31 NOTE — Telephone Encounter (Signed)
Pt didn't show up for appt

## 2011-11-05 ENCOUNTER — Encounter (HOSPITAL_COMMUNITY): Payer: Self-pay | Admitting: Emergency Medicine

## 2011-11-05 ENCOUNTER — Emergency Department (HOSPITAL_COMMUNITY)
Admission: EM | Admit: 2011-11-05 | Discharge: 2011-11-06 | Disposition: A | Discharge status: Home / Self Care | Payer: Self-pay | Attending: Emergency Medicine | Admitting: Emergency Medicine

## 2011-11-05 DIAGNOSIS — E039 Hypothyroidism, unspecified: Secondary | ICD-10-CM | POA: Insufficient documentation

## 2011-11-05 DIAGNOSIS — L301 Dyshidrosis [pompholyx]: Secondary | ICD-10-CM | POA: Insufficient documentation

## 2011-11-05 DIAGNOSIS — I25810 Atherosclerosis of coronary artery bypass graft(s) without angina pectoris: Secondary | ICD-10-CM | POA: Insufficient documentation

## 2011-11-05 DIAGNOSIS — I10 Essential (primary) hypertension: Secondary | ICD-10-CM | POA: Insufficient documentation

## 2011-11-05 DIAGNOSIS — M79609 Pain in unspecified limb: Secondary | ICD-10-CM | POA: Insufficient documentation

## 2011-11-05 HISTORY — DX: Dermatitis, unspecified: L30.9

## 2011-11-05 MED ORDER — MUPIROCIN CALCIUM 2 % EX CREA: TOPICAL_CREAM | Freq: Two times a day (BID) | CUTANEOUS | Status: DC

## 2011-11-05 MED ORDER — MUPIROCIN CALCIUM 2 % EX CREA
TOPICAL_CREAM | CUTANEOUS | Status: AC
  Administered 2011-11-05: 1 via TOPICAL
  Filled 2011-11-05: qty 15

## 2011-11-05 NOTE — ED Provider Notes (Signed)
History     CSN: [id number]  Arrival date & time 11/05/11  1959   First MD Initiated Contact with Patient 11/05/11 2137      Chief Complaint  Patient presents with  . Hand Pain    (Consider location/radiation/quality/duration/timing/severity/associated sxs/prior treatment) HPI Comments: Patient with a history of eczema, especially on her hands now has an outbreak, worse on the left and right including the finger webs, now with skin breakdown and edema, erythema  Patient is a 56 y.o. female presenting with hand pain. The history is provided by the patient.  Hand Pain This is a recurrent problem. The current episode started in the past 7 days. The problem occurs constantly. Associated symptoms include a rash. Pertinent negatives include no fever. The symptoms are aggravated by exertion. She has tried nothing for the symptoms. The treatment provided no relief.    Past Medical History  Diagnosis Date  . CAD (coronary artery disease)     cath 09/2010: LAD stent patent, S-Int/dCFX ok, S-PDA ok, L-LAD atretic  . Hypertension   . Hx of CABG   . Diastolic dysfunction     TEE 11/2010: EF 60-65%, BAE, trivial atrial septal shunt;  right heart cath in 10/2010 with elevated R and L heart pressures and diuretic started  . HLD (hyperlipidemia)   . Hypothyroidism   . Sleep apnea   . COPD (chronic obstructive pulmonary disease)   . Pulmonary nodules     repeat CT due in 11/2011  . Acute right MCA stroke 11/07/10  . 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 Eczema     Past Surgical History  Procedure Date  . Coronary artery bypass graft   . Debriment for infection in chest   . Chest wall reconstruction   . Bilateral knee surgery   . Bladder surgery   . Left mastoidectomy   . Hernia repair   . Cath 2012     Family History  Problem Relation Age of Onset  . COPD Mother   . Heart disease Mother   . Arthritis Mother     Rheumatoid and PMR  . Heart attack Father   .  Depression Father   . Hypertension Father   . Osteoporosis Mother     Mom fractured hip  . Diabetes type II Mother     History  Substance Use Topics  . Smoking status: Former Smoker -- 1.0 packs/day for 30 years    Quit date: 06/14/2006  . Smokeless tobacco: Never Used  . Alcohol Use: No    OB History    Grav Para Term Preterm Abortions TAB SAB Ect Mult Living                  Review of Systems  Constitutional: Negative for fever.  Skin: Positive for rash and wound.  Neurological: Positive for dizziness.    Allergies  Amoxicillin; Avelox; Hydrocodone; Penicillins; and Sulfa antibiotics  Home Medications   Current Outpatient Rx  Name Route Sig Dispense Refill  . ASPIRIN 81 MG PO TABS Oral Take 81 mg by mouth daily.      [name] CLOPIDOGREL BISULFATE 75 MG PO TABS Oral Take 1 tablet (75 mg total) by mouth daily. 30 tablet 6  . DULOXETINE HCL 60 MG PO CPEP Oral Take 60 mg by mouth daily.    [name] FAMOTIDINE 20 MG PO TABS Oral Take 1 tablet (20 mg total) by mouth daily.    [name] FLUOCINONIDE  0.05 % EX OINT Topical Apply 1 application topically 2 (two) times daily.      [name] HYDROXYZINE HCL 10 MG PO TABS Oral Take 10-20 mg by mouth 4 (four) times daily. Take one tablet by mouth 3 times a day and 2 at bedtime    . LEVOTHYROXINE SODIUM 137 MCG PO TABS Oral Take 137 mcg by mouth daily.    [name] MAGNESIUM OXIDE 400 MG PO TABS Oral Take 1 tablet (400 mg total) by mouth daily.    [name] POTASSIUM CHLORIDE CRYS ER 10 MEQ PO TBCR Oral Take 2 tablets (20 mEq total) by mouth daily.    . QUETIAPINE FUMARATE ER 200 MG PO TB24 Oral Take 200 mg by mouth every evening.     [name] ROSUVASTATIN CALCIUM 5 MG PO TABS Oral Take 1 tablet (5 mg total) by mouth daily. 30 tablet 9  . SENNOSIDES 8.6 MG PO TABS Oral Take 1 tablet by mouth daily.      [name] SPIRONOLACTONE 25 MG PO TABS Oral Take 25 mg by mouth daily.    [name] TIZANIDINE HCL 2 MG PO CAPS Oral Take 2 mg by mouth 2 (two) times daily.       BP 154/85  Pulse 62  Temp(Src)  98.1 F (36.7 C) (Oral)  Resp 20  SpO2 95%  Physical Exam  Constitutional: She appears well-developed and well-nourished.  HENT:  Head: Normocephalic.  Eyes: Pupils are equal, round, and reactive to light.  Cardiovascular: Normal rate.   Pulmonary/Chest: Effort normal.  Musculoskeletal: Normal range of motion. She exhibits edema.  Neurological: She is alert.  Skin:       ED Course  Procedures (including critical care time)  Labs Reviewed - No data to display No results found.   1. Eczema, dyshidrotic     Due to patient's multiple drug allergies discussed at length.  Best treatment for her eczema breakdown with superimposed infection and decided to go with go with an oral antibiotic  MDM  Infected eczema with multiple drug allergies        Arman Filter, NP 11/05/11 2222  Arman Filter, NP 11/05/11 2344  Arman Filter, NP 11/05/11 2344

## 2011-11-05 NOTE — Discharge Instructions (Signed)
Use the provided Bactroban twice a day for 7 days.  Please return at the end of that time for recheck.  Change.  Your appointment for better treatment of just the eczema without the superimposed infection.  Please return sooner if not improving

## 2011-11-05 NOTE — ED Notes (Signed)
Patient complaining of an eczema flare-up for about a week.  Patient complaining of left hand swelling with bloody/yellow seeping drainage.  Patient states that the nurse at the shelter where the patient is living recommended that she come to the ED.  Patient states that she has been scratching at both hands; both hands wrapped in gauze (wrapped by nurse in triage).  Patient has been using band-aids at the shelter; has not applied any ointment/medications to the sores.  Patient complaining of generalized itching all over her body; patient states that this is a chronic problem.  Patient alert and oriented x4; PERRL present.  Will continue to monitor.

## 2011-11-05 NOTE — ED Notes (Signed)
Bandages applied to fingers and hand after Bactroban has been applied.

## 2011-11-05 NOTE — ED Notes (Signed)
Pt c/o bil hand swelling and drainage from hands  Pt st's she has eczema and is having a flare up.  Has been living at the shelter and is out of her meds for same

## 2011-11-05 NOTE — ED Notes (Signed)
Waiting on Bactroban (second tube) to be sent from pharmacy.

## 2011-11-05 NOTE — ED Notes (Signed)
Patient currently resting quietly; no respiratory or acute distress noted.  Patient updated on plan of care; informed patient that we are waiting on Bactroban from pharmacy.  Patient has no other questions or concerns at this time; will continue to monitor.

## 2011-11-05 NOTE — ED Notes (Signed)
Waiting from pharmacy for bactroban cream.

## 2011-11-06 MED ORDER — MUPIROCIN CALCIUM 2 % EX CREA: TOPICAL_CREAM | CUTANEOUS | Status: DC

## 2011-11-06 NOTE — ED Provider Notes (Signed)
Medical screening examination/treatment/procedure(s) were performed by non-physician practitioner and as supervising physician I was immediately available for consultation/collaboration.   Celene Kras, MD 11/06/11 [phone]

## 2011-11-08 ENCOUNTER — Emergency Department (HOSPITAL_COMMUNITY): Payer: Self-pay

## 2011-11-08 ENCOUNTER — Encounter (HOSPITAL_COMMUNITY): Payer: Self-pay | Admitting: Emergency Medicine

## 2011-11-08 ENCOUNTER — Emergency Department (HOSPITAL_COMMUNITY)
Admission: EM | Admit: 2011-11-08 | Discharge: 2011-11-08 | Disposition: A | Discharge status: Home Health | Payer: Self-pay | Attending: Emergency Medicine | Admitting: Emergency Medicine

## 2011-11-08 DIAGNOSIS — L2989 Other pruritus: Secondary | ICD-10-CM | POA: Insufficient documentation

## 2011-11-08 DIAGNOSIS — E785 Hyperlipidemia, unspecified: Secondary | ICD-10-CM | POA: Insufficient documentation

## 2011-11-08 DIAGNOSIS — Z7982 Long term (current) use of aspirin: Secondary | ICD-10-CM | POA: Insufficient documentation

## 2011-11-08 DIAGNOSIS — E039 Hypothyroidism, unspecified: Secondary | ICD-10-CM | POA: Insufficient documentation

## 2011-11-08 DIAGNOSIS — L02519 Cutaneous abscess of unspecified hand: Secondary | ICD-10-CM | POA: Insufficient documentation

## 2011-11-08 DIAGNOSIS — L0390 Cellulitis, unspecified: Secondary | ICD-10-CM

## 2011-11-08 DIAGNOSIS — L309 Dermatitis, unspecified: Secondary | ICD-10-CM

## 2011-11-08 DIAGNOSIS — R21 Rash and other nonspecific skin eruption: Secondary | ICD-10-CM | POA: Insufficient documentation

## 2011-11-08 DIAGNOSIS — L298 Other pruritus: Secondary | ICD-10-CM | POA: Insufficient documentation

## 2011-11-08 DIAGNOSIS — I10 Essential (primary) hypertension: Secondary | ICD-10-CM | POA: Insufficient documentation

## 2011-11-08 DIAGNOSIS — L259 Unspecified contact dermatitis, unspecified cause: Secondary | ICD-10-CM | POA: Insufficient documentation

## 2011-11-08 DIAGNOSIS — I2510 Atherosclerotic heart disease of native coronary artery without angina pectoris: Secondary | ICD-10-CM | POA: Insufficient documentation

## 2011-11-08 DIAGNOSIS — Z79899 Other long term (current) drug therapy: Secondary | ICD-10-CM | POA: Insufficient documentation

## 2011-11-08 MED ORDER — AZITHROMYCIN 250 MG PO TABS: 250.0000 mg | ORAL_TABLET | Freq: Every day | ORAL | Status: DC

## 2011-11-08 MED ORDER — HYDROXYZINE HCL 25 MG PO TABS
25.0000 mg | ORAL_TABLET | Freq: Once | ORAL | Status: AC
  Administered 2011-11-08: 25 mg via ORAL
  Filled 2011-11-08: qty 1

## 2011-11-08 MED ORDER — HYDROXYZINE HCL 25 MG PO TABS: 25.0000 mg | ORAL_TABLET | Freq: Four times a day (QID) | ORAL | Status: DC

## 2011-11-08 MED ORDER — AZITHROMYCIN 250 MG PO TABS
500.0000 mg | ORAL_TABLET | Freq: Once | ORAL | Status: AC
  Administered 2011-11-08: 500 mg via ORAL
  Filled 2011-11-08: qty 2

## 2011-11-08 MED ORDER — DEXTROSE 5 % IV SOLN: 500.0000 mg | Freq: Once | INTRAVENOUS | Status: DC

## 2011-11-08 NOTE — ED Notes (Signed)
Pt ambulated to and from x-ray.  NP in to see pt.  Pt denies any needs at this time.

## 2011-11-08 NOTE — ED Notes (Signed)
Pt was seen in here Tuesday night for  Eczema to both hands, pt was given a topical antibiotic and wants her hands rechecked

## 2011-11-08 NOTE — ED Notes (Signed)
Went in to assess pt.  Pt not found in room.

## 2011-11-08 NOTE — ED Notes (Signed)
Provided pt with snack and drinks

## 2011-11-08 NOTE — ED Provider Notes (Signed)
History     CSN: [id number]  Arrival date & time 11/08/11  1354   First MD Initiated Contact with Patient 11/08/11 1632      Chief Complaint  Patient presents with  . Rash    (Consider location/radiation/quality/duration/timing/severity/associated sxs/prior treatment) Patient is a 56 y.o. female presenting with rash. The history is provided by the patient. No language interpreter was used.  Rash  This is a chronic problem. The current episode started more than 1 week ago. The problem has been gradually worsening. There has been no fever. The rash is present on the right hand, left hand and face. The pain is moderate. The pain has been fluctuating since onset. Associated symptoms include itching. She has tried antibiotic cream for the symptoms. The treatment provided no relief.    Past Medical History  Diagnosis Date  . CAD (coronary artery disease)     cath 09/2010: LAD stent patent, S-Int/dCFX ok, S-PDA ok, L-LAD atretic  . Hypertension   . Hx of CABG   . Diastolic dysfunction     TEE 11/2010: EF 60-65%, BAE, trivial atrial septal shunt;  right heart cath in 10/2010 with elevated R and L heart pressures and diuretic started  . HLD (hyperlipidemia)   . Hypothyroidism   . Sleep apnea   . COPD (chronic obstructive pulmonary disease)   . Pulmonary nodules     repeat CT due in 11/2011  . Acute right MCA stroke 11/07/10  . 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 Eczema     Past Surgical History  Procedure Date  . Coronary artery bypass graft   . Debriment for infection in chest   . Chest wall reconstruction   . Bilateral knee surgery   . Bladder surgery   . Left mastoidectomy   . Hernia repair   . Cath 2012     Family History  Problem Relation Age of Onset  . COPD Mother   . Heart disease Mother   . Arthritis Mother     Rheumatoid and PMR  . Heart attack Father   . Depression Father   . Hypertension Father   . Osteoporosis Mother     Mom fractured hip   . Diabetes type II Mother     History  Substance Use Topics  . Smoking status: Former Smoker -- 1.0 packs/day for 30 years    Quit date: 06/14/2006  . Smokeless tobacco: Never Used  . Alcohol Use: No    OB History    Grav Para Term Preterm Abortions TAB SAB Ect Mult Living                  Review of Systems  Skin: Positive for itching and rash.  All other systems reviewed and are negative.    Allergies  Amoxicillin; Avelox; Hydrocodone; Penicillins; and Sulfa antibiotics  Home Medications   Current Outpatient Rx  Name Route Sig Dispense Refill  . ASPIRIN EC 81 MG PO TBEC Oral Take 81 mg by mouth daily.    [name] CLOPIDOGREL BISULFATE 75 MG PO TABS Oral Take 75 mg by mouth daily.    . DULOXETINE HCL 60 MG PO CPEP Oral Take 60 mg by mouth daily.    [name] FAMOTIDINE 20 MG PO TABS Oral Take 1 tablet (20 mg total) by mouth daily.    [name] FLUOCINONIDE 0.05 % EX OINT Topical Apply 1 application topically 2 (two) times daily.      [name]  HYDROXYZINE HCL 10 MG PO TABS Oral Take 10-20 mg by mouth 4 (four) times daily. Take one tablet by mouth 3 times a day and 2 at bedtime    . LEVOTHYROXINE SODIUM 137 MCG PO TABS Oral Take 137 mcg by mouth daily.    [name] MAGNESIUM OXIDE 400 MG PO TABS Oral Take 1 tablet (400 mg total) by mouth daily.    [name] MUPIROCIN CALCIUM 2 % EX CREA Topical Apply topically to Emergency Dept-[facility].    [name] POTASSIUM CHLORIDE CRYS ER 10 MEQ PO TBCR Oral Take 2 tablets (20 mEq total) by mouth daily.    . QUETIAPINE FUMARATE ER 200 MG PO TB24 Oral Take 200 mg by mouth every evening.     [name] ROSUVASTATIN CALCIUM 5 MG PO TABS Oral Take 5 mg by mouth daily.    . SENNOSIDES 8.6 MG PO TABS Oral Take 1 tablet by mouth daily.      [name] SPIRONOLACTONE 25 MG PO TABS Oral Take 25 mg by mouth daily.    [name] TIZANIDINE HCL 2 MG PO CAPS Oral Take 2 mg by mouth 2 (two) times daily.       BP 139/82  Pulse 64  Temp(Src) 97.5 F (36.4 C) (Oral)  Resp 18  SpO2 100%  Physical Exam  Nursing note and  vitals reviewed. Constitutional: She is oriented to person, place, and time. She appears well-developed and well-nourished.  HENT:  Head: Normocephalic and atraumatic.  Eyes: Conjunctivae are normal. Pupils are equal, round, and reactive to light.  Neck: Normal range of motion. Neck supple.  Cardiovascular: Normal rate, regular rhythm, normal heart sounds and intact distal pulses.   Pulmonary/Chest: Breath sounds normal.  Abdominal: Soft. Bowel sounds are normal.  Musculoskeletal: Normal range of motion.  Neurological: She is alert and oriented to person, place, and time.  Skin: Skin is warm and dry. Rash noted. There is erythema.       ED Course  Procedures (including critical care time)  Labs Reviewed - No data to display Dg Hand Complete Left  11/08/2011  *RADIOLOGY REPORT*  Clinical Data: Left hand pain with swelling.  Infection.  Eczema.  LEFT HAND - COMPLETE 3+ VIEW  Comparison: None.  Findings: There is diffuse soft tissue swelling in the dorsum of the hand.  No foreign body or soft tissue emphysema is identified. There is generalized osteopenia without evidence of acute bone destruction, fracture or dislocation.  Mild interphalangeal and intercarpal degenerative changes are noted.  IMPRESSION:  1.  Dorsal soft tissue swelling without evidence of foreign body or soft tissue emphysema. 2.  No acute or focal osseous abnormalities identified.  Original Report Authenticated By: Gerrianne Scale, M.D.     No diagnosis found.   Eczema with early cellulitis. MDM   5:53 PM Discussed with Dr. Juleen China.  Will treat with azithromycin d/t PCN, sulfa allergy.       Jimmye Norman, NP 11/08/11 [phone]

## 2011-11-08 NOTE — Discharge Instructions (Signed)

## 2011-11-09 NOTE — ED Provider Notes (Signed)
Medical screening examination/treatment/procedure(s) were performed by non-physician practitioner and as supervising physician I was immediately available for consultation/collaboration.  Ayham Word, MD 11/09/11 0058 

## 2011-11-11 ENCOUNTER — Telehealth: Payer: Self-pay | Admitting: Internal Medicine

## 2011-11-12 ENCOUNTER — Ambulatory Visit (INDEPENDENT_AMBULATORY_CARE_PROVIDER_SITE_OTHER): Payer: Self-pay | Admitting: Internal Medicine

## 2011-11-12 ENCOUNTER — Encounter: Payer: Self-pay | Admitting: Internal Medicine

## 2011-11-12 VITALS — BP 130/90 | HR 78 | Wt 158.0 lb

## 2011-11-12 DIAGNOSIS — I2510 Atherosclerotic heart disease of native coronary artery without angina pectoris: Secondary | ICD-10-CM

## 2011-11-12 DIAGNOSIS — E039 Hypothyroidism, unspecified: Secondary | ICD-10-CM

## 2011-11-12 DIAGNOSIS — L0390 Cellulitis, unspecified: Secondary | ICD-10-CM

## 2011-11-12 DIAGNOSIS — L0291 Cutaneous abscess, unspecified: Secondary | ICD-10-CM

## 2011-11-12 DIAGNOSIS — L309 Dermatitis, unspecified: Secondary | ICD-10-CM | POA: Insufficient documentation

## 2011-11-12 DIAGNOSIS — Z5900 Homelessness unspecified: Secondary | ICD-10-CM

## 2011-11-12 DIAGNOSIS — L259 Unspecified contact dermatitis, unspecified cause: Secondary | ICD-10-CM

## 2011-11-12 HISTORY — DX: Homelessness unspecified: Z59.00

## 2011-11-12 LAB — BASIC METABOLIC PANEL
BUN: 16 mg/dL (ref 6–23)
Calcium: 8.5 mg/dL (ref 8.4–10.5)
Creatinine, Ser: 1 mg/dL (ref 0.4–1.2)

## 2011-11-12 LAB — TSH: TSH: 0.06 u[IU]/mL — ABNORMAL LOW (ref 0.35–5.50)

## 2011-11-12 LAB — MAGNESIUM: Magnesium: 1.2 mg/dL — ABNORMAL LOW (ref 1.5–2.5)

## 2011-11-12 MED ORDER — SPIRONOLACTONE 25 MG PO TABS: 25.0000 mg | ORAL_TABLET | Freq: Every day | ORAL | Status: DC

## 2011-11-12 MED ORDER — LEVOTHYROXINE SODIUM 125 MCG PO TABS: 125.0000 ug | ORAL_TABLET | Freq: Every day | ORAL | Status: DC

## 2011-11-12 MED ORDER — CEPHALEXIN 500 MG PO CAPS: 500.0000 mg | ORAL_CAPSULE | Freq: Three times a day (TID) | ORAL | Status: DC

## 2011-11-12 MED ORDER — HYDROXYZINE HCL 25 MG PO TABS: 25.0000 mg | ORAL_TABLET | Freq: Four times a day (QID) | ORAL | Status: AC

## 2011-11-12 MED ORDER — FLUOCINONIDE 0.05 % EX OINT: 1.0000 "application " | TOPICAL_OINTMENT | Freq: Two times a day (BID) | CUTANEOUS | Status: DC

## 2011-11-12 NOTE — Progress Notes (Signed)
Subjective:    Patient ID: [name], female    DOB: Mar 29, 1956, 56 y.o.   MRN: [id number]  HPI  Patient comes in for followup from emergency room evaluation for infection cellulitis of her left hand and also refills on medications that we can help her with.  Since her last visit with [place] she and her sister had another falling out and she had to leave the residence. She has been in a homeless shelter for the last 3-4 weeks currently in the [facility]. She has no insurance no income and   was recently denied SS DI again. She is not eligible for Medicaid because she has no dependent children.  At the homeless shelter she has to wipe down tables with warm to hot waters of bleach for cleaning. This has irritated her hands although recently they have let her use gloves. Her hand dermatitis has gotten worse and more recently has had left hand swelling and pain without using. She went to the emergency room they did an x-ray and eventually placed her on azithromycin. After 3 days she noticed some itching in her abdomen with some kind of rash so she stopped that medication in case it was an allergy. She never had hives chest pain or shortness of breath over baseline She had been using a steroid topical and was given an antibiotic ointment. .   She was told to go back on her magnesium which I believe she has needs a refill on Spiriva lactone Lidex ointment and her thyroid medication. She also asks for the muscle relaxant although hasn't been on it a while.  She is no longer in rehabilitation because they cannot see her without insurance.  She has seen specialists for her depression and this is getting worse and she is still on these medications.    Review of Systems No change in her chest pain no fever or UTI symptoms are better from last time. No bleeding or bruising no recent falling she lost her cane in his having to walk without assistance has some increased hip pain recently. She  still has some weakness in her left upper extremity difficulty swallowing pain in her left shoulder especially with elevation. She feels like her speech is sometimes off.  She remember simple things but feels like she has lost her high functioning executive function.  That enabled her to get a college degree.  Past history family history social history reviewed in the electronic medical record.     Objective:   Physical Exam WD  Alert WF in nad  Able to walk with out cane but sme minimal limp.  Neck no masses  Chest:  Clear to A&P without wheezes rales or rhonchi CV:  S1-S2 no gallops or murmurs peripheral perfusion is normal Skin shows severely cracked eczematous dermatitis bilaterally but the left dorsal hand is severely swollen +2 without streaking there is no joint involvement. There a few pustules on the palm.  She has some dry skin  and on her to have some excoriated rash no hives or pustules. Back is almost clear. Face is clear.  Review emergency room visits.    Assessment & Plan:  Left hand cellulitis infection underlying eczema hand dermatitis probably aggravated by having to use Clorox hot water and hand chills at the homeless shelter.   Unsure if she really has an allergy to the azithromycin. She was able to take Keflex in the past why this for anti-staph coverage she is now able  to use gloves at the homeless shelter to avoid contact dermatitis and she can use the steroid ointment as needed.   She should follow up if she is not getting better over the next few days. Or certainly if she's having any fever. Can refill the Atarax but care with [place].   Homelessness currently now in a shelter.  Unclear future  Plans. Says she needs to try to get a job.   Incredible fall into poverty for medical  and psychiatric ( depressive and cognitive ) reasons after her stroke and denial from the medical care disability Social Security system  Unable to  obtain Medicare health coverage  which appears  unbelievable.    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  Recurrent hypomagnesemia and hypokalemia with history of hospitalizations has been somewhat stable recently just put back on her magnesium. Due for a check. refilled his spironolactone also.  Depression   on multiple meds some increase in Cymbalta. Would recommend trying off of the muscle relaxant at this point in time as this could cause mental fogginess also.  Thyroid replacement   her last TSH was in the fall and was a bit over suppressed but this was not attended to because of her inability to followup and her other metabolic factors. She states she has been taking her medicine daily. We'll plan on decreasing her dose appeared only and checking the level today. Her prescription and given  for .125 mg  Instead of  .137.    Addendum  :See tsh  Plan top go down to 0.100mg  per day  If can contact her about this.

## 2011-11-12 NOTE — Patient Instructions (Addendum)
Will let you know about the labs when available. for now plan on lower dose of thyroid replacement. [name]125  Mg per day.   Your hand dermatitis has a secondary infection on top of the eczema  Agree keeping your hands out of harsh chemicals and such. Posey Rea if rash is from the azithromycin or not.  Take keflex for  this infection for now.

## 2011-11-13 NOTE — Progress Notes (Signed)
Quick Note:  Left a message for pt to return call. ______ 

## 2011-11-14 ENCOUNTER — Telehealth: Payer: Self-pay | Admitting: *Deleted

## 2011-11-14 NOTE — Telephone Encounter (Signed)
Left message to call back  

## 2011-11-14 NOTE — Telephone Encounter (Signed)
[facility] called pt dropped of rx's there and they didn't have md's dea number. So they called for it and while I was on the phone with them, I got them to change the levothyroxine as well.

## 2011-11-14 NOTE — Progress Notes (Signed)
Quick Note:  Left message for pt to call back ______ 

## 2011-11-14 NOTE — Telephone Encounter (Signed)
Message copied by Romualdo Bolk on Thu Nov 14, 2011  8:41 AM ------      Message from: [facility] [place] [facility]      Created: Tue Nov 12, 2011  6:59 PM       Sande Rives             I  Wrote  for levothyroxine 0.125  Mg but if she hasn't gotten it filled yet I would prefer her go down to .100 mg of levothyroxin eper day.            Will have to re write or call in to where she thinks would be most economical .            See result note also.         thnaks      wp

## 2011-11-15 LAB — WOUND CULTURE
Gram Stain: NONE SEEN
Gram Stain: NONE SEEN
Gram Stain: NONE SEEN
Organism ID, Bacteria: NO GROWTH

## 2011-11-15 NOTE — Progress Notes (Signed)
Quick Note:  Left message to call back. ______ 

## 2011-11-18 ENCOUNTER — Telehealth: Payer: Self-pay

## 2011-11-18 DIAGNOSIS — E876 Hypokalemia: Secondary | ICD-10-CM

## 2011-11-18 NOTE — Telephone Encounter (Signed)
I left a message on the number listed for the pt. I also called the pt's son and spoke with him about the pt's lab results.  He said that the pt had recently gotten a new cell phone and that he did not have the new number.  He will be seeing his mom and said that he would give her the message about her results and medication.  He does help fill her medication bottles.  I made him aware that the pt needs to come into the office next week for a repeat BMP and Magnesium.

## 2011-11-21 ENCOUNTER — Telehealth: Payer: Self-pay | Admitting: Cardiology

## 2011-11-21 ENCOUNTER — Telehealth: Payer: Self-pay | Admitting: Internal Medicine

## 2011-11-21 DIAGNOSIS — E876 Hypokalemia: Secondary | ICD-10-CM

## 2011-11-21 DIAGNOSIS — E039 Hypothyroidism, unspecified: Secondary | ICD-10-CM

## 2011-11-21 MED ORDER — MAGNESIUM OXIDE 400 MG PO TABS: 400.0000 mg | ORAL_TABLET | Freq: Two times a day (BID) | ORAL | Status: DC

## 2011-11-21 NOTE — Telephone Encounter (Signed)
Addended by: Dossie Arbour on: 11/21/2011 12:40 PM   Modules accepted: Orders

## 2011-11-21 NOTE — Progress Notes (Signed)
Quick Note:  Left message on machine about results. ______ 

## 2011-11-21 NOTE — Telephone Encounter (Signed)
Spoke with pt.  Her magnesium was low when seen by Dr. Riley Kill on Feb. 20, 2013. When results received orders given to increase Magnesium. Julieta Gutting tried several times to reach pt but was unable to reach her. Pt states she did receive message from November 18, 2011 with instructions regarding Magnesium but she has not started it as she has changed pharmacies.  Pt reports she was taking Magnesium 400 mg daily when she saw Dr. Riley Kill on Feb. 20. Since then she has run out and has not been taking for "a couple of weeks". Pt not sure when she ran out. She saw Dr. Fabian Sharp on November 12, 2011 and lab work was done that day but pt does not remember if she had run out of Magnesium prior to that office visit.  Will review with Dr. Daleen Squibb as to dose of Magnesium pt should be on and when follow up lab work needed. Pt is changing to [facility] --[address]Phone is [phone]

## 2011-11-21 NOTE — Telephone Encounter (Signed)
FU Call: Pt returning call to Lauren. Please return pt call to discuss further.  

## 2011-11-21 NOTE — Telephone Encounter (Signed)
.  Prescribe 400mg  po bid . Stress compliance and needs Mg level in 10 days.

## 2011-11-21 NOTE — Progress Notes (Signed)
Quick Note:  Left message on machine about results and to call back to make a lab appt in 6 weeks. ______

## 2011-11-21 NOTE — Telephone Encounter (Signed)
Pt is return [name] °

## 2011-11-21 NOTE — Telephone Encounter (Signed)
Spoke with pt and gave her instructions regarding increase in Magnesium dosage and lab work. I told her prescription has been sent electronically to her pharmacy.  She is unable to start medication until Saturday due to financial reasons so will come in for BMP and Mg on December 02, 2011.  Pt aware of need to be compliant with meds and lab work.

## 2011-11-25 ENCOUNTER — Other Ambulatory Visit: Payer: Self-pay

## 2011-12-02 ENCOUNTER — Other Ambulatory Visit: Payer: Self-pay | Admitting: *Deleted

## 2011-12-02 ENCOUNTER — Other Ambulatory Visit: Payer: Self-pay

## 2011-12-02 ENCOUNTER — Telehealth: Payer: Self-pay

## 2011-12-02 ENCOUNTER — Telehealth: Payer: Self-pay | Admitting: Cardiology

## 2011-12-02 DIAGNOSIS — E876 Hypokalemia: Secondary | ICD-10-CM

## 2011-12-02 MED ORDER — POTASSIUM CHLORIDE CRYS ER 10 MEQ PO TBCR: 20.0000 meq | EXTENDED_RELEASE_TABLET | Freq: Every day | ORAL | Status: DC

## 2011-12-02 NOTE — Telephone Encounter (Signed)
Done

## 2011-12-02 NOTE — Telephone Encounter (Signed)
Per Dr. Fabian Sharp called pt to get symptoms due to pt having a history of stokes.  Left a message for pt to return call.  Pls triage in the morning.

## 2011-12-02 NOTE — Telephone Encounter (Signed)
Pt needs refill of potassium chloride, uses [facility]

## 2011-12-03 ENCOUNTER — Encounter: Payer: Self-pay | Admitting: Internal Medicine

## 2011-12-03 ENCOUNTER — Ambulatory Visit (INDEPENDENT_AMBULATORY_CARE_PROVIDER_SITE_OTHER): Payer: Self-pay | Admitting: Internal Medicine

## 2011-12-03 VITALS — BP 164/98 | HR 60 | Wt 162.0 lb

## 2011-12-03 DIAGNOSIS — I10 Essential (primary) hypertension: Secondary | ICD-10-CM

## 2011-12-03 DIAGNOSIS — I6350 Cerebral infarction due to unspecified occlusion or stenosis of unspecified cerebral artery: Secondary | ICD-10-CM

## 2011-12-03 DIAGNOSIS — E039 Hypothyroidism, unspecified: Secondary | ICD-10-CM

## 2011-12-03 DIAGNOSIS — Z590 Homelessness: Secondary | ICD-10-CM

## 2011-12-03 DIAGNOSIS — I639 Cerebral infarction, unspecified: Secondary | ICD-10-CM

## 2011-12-03 MED ORDER — METOPROLOL SUCCINATE ER 25 MG PO TB24: 12.5000 mg | ORAL_TABLET | Freq: Every day | ORAL | Status: DC

## 2011-12-03 NOTE — Patient Instructions (Addendum)
Our blood pressure if high today.    Low dose metoprolol.  For now. 12. 5 mg per day   Check bp readings in 1-2 weeks  .and cal this in .   We may increase dose as tolerated.   Get your labs done.    As planned .

## 2011-12-03 NOTE — Assessment & Plan Note (Signed)
Elevated blood pressure today similar to what the nurses taught.  She basically only been on spironolactone concerned her elevations are high risk for her. She was on low-dose metoprolol in the past we'll restart this at a 12.5 mg a day half of the 25 extended release.   We will plan some kind of followup she states that she can try to get someone to take her blood pressure and call in readings. She should make sure that she follows up with laboratory studies with her potassium levels and thyroid as we discussed. She delayed this test because she had run out.

## 2011-12-03 NOTE — Telephone Encounter (Signed)
Left message to call back  

## 2011-12-03 NOTE — Assessment & Plan Note (Signed)
Still at the [facility] at this point

## 2011-12-03 NOTE — Progress Notes (Signed)
  Subjective:    Patient ID: [name], female    DOB: 1956/04/29, 56 y.o.   MRN: [id number]  HPI Patient comes in today for NDA for  New problem worsening problem.  See above.  Complex medical patinet without insurance with manu risk factors  Living [facility] a [facility] today with  Concern about her blood pressure and trying to have a TIA.    After talking to son on phone  [facility]  Son said her speech was a little bit like when she had her TIA. She went and got her blood pressure checked and it was. 180/95 range it was repeated and was also elevated 1 6170 range.  She has been out ofout of spironolactone for a while .  Just restarted  spriolonalcton 5 days ago.  Out of  potassium for about a week  To get today.  Delayed labs because was out of med.  To get done next week.  Feeling stange  Yesterday.  on asa and plavix.v some better today  Still weak in left hand grip. And left lower extremity she also has some pain in her left buttocks hip area and is tired.  No new shortness of breath or chest pain no fever no falling no bleeding Review of Systems See history of present illness.   No current coughing some of the cosmetic odor sprayed by her roommate bothers her lungs but she is unable to afford these things. Past history family history social history reviewed in the electronic medical record.     Objective:   Physical Exam  WDWN in nad  No change in speech  By my view Alert in nad \ BP 140/80  Pulse 60  Wt 162 lb (73.483 kg)  SpO2 100% Wt Readings from Last 3 Encounters:  12/03/11 162 lb (73.483 kg)  11/12/11 158 lb (71.668 kg)  10/30/11 160 lb 12.8 oz (72.938 kg)    Repeat blood pressure readings right 164/98 left 160/100    These are both sitting. Pulse is in the 70 range Speech is somewhat flat left facial droop no obvious change  .  No obvious dysarthria otherwise. Neck without masses  Cardiac S1-S2 regular rhythm. Skin no  acute bruising.  Dermatitis on hands is much better but still a bit peeling She walks with a slight limp and is able to ambulate without a cane. Little bit unsteady.      Assessment & Plan:   Some symptoms yesterday with associated elevated blood pressure readings.  Uncertain  Causes but her blood pressure readings have been elevated at least 3 times and was on medication in the past. We'll put her back on low-dose metoprolol. She's been off spironolactone and a potassium unsure what her metabolic state is but she will followup.  Hypothyroid a bit over suppressed adjustments have been made  Homelessness lack of funds  Relying  on her son who helps put medications out. This  make her  Sad if he does not have financial resources.

## 2011-12-03 NOTE — Telephone Encounter (Signed)
Carollee Herter please call pt per Dr Rosezella [place] request

## 2011-12-03 NOTE — Assessment & Plan Note (Signed)
Over suppressed at this time on a reduced dose plan is to check her TSH in the next month.

## 2011-12-04 NOTE — Telephone Encounter (Signed)
Pt came in for appt. 

## 2011-12-05 ENCOUNTER — Telehealth: Payer: Self-pay | Admitting: *Deleted

## 2011-12-05 NOTE — Telephone Encounter (Signed)
Please update her med list. Thanks.

## 2011-12-05 NOTE — Telephone Encounter (Signed)
Pt wanted Dr. Fabian Sharp to know that her pyschologist? Lowered her Cymbalta to 30 mg. And Lexapro to 10 mg.  She thinks it was 10 mg.

## 2011-12-05 NOTE — Telephone Encounter (Signed)
Medication list updated.

## 2011-12-16 ENCOUNTER — Ambulatory Visit (INDEPENDENT_AMBULATORY_CARE_PROVIDER_SITE_OTHER): Payer: Self-pay | Admitting: *Deleted

## 2011-12-16 ENCOUNTER — Ambulatory Visit (INDEPENDENT_AMBULATORY_CARE_PROVIDER_SITE_OTHER): Payer: Self-pay

## 2011-12-16 VITALS — BP 124/72 | HR 72 | Ht 64.0 in | Wt 161.4 lb

## 2011-12-16 DIAGNOSIS — E876 Hypokalemia: Secondary | ICD-10-CM

## 2011-12-16 DIAGNOSIS — I2510 Atherosclerotic heart disease of native coronary artery without angina pectoris: Secondary | ICD-10-CM

## 2011-12-16 DIAGNOSIS — E039 Hypothyroidism, unspecified: Secondary | ICD-10-CM

## 2011-12-16 LAB — BASIC METABOLIC PANEL
Calcium: 9.6 mg/dL (ref 8.4–10.5)
Creatinine, Ser: 1.1 mg/dL (ref 0.4–1.2)
GFR: 56.76 mL/min — ABNORMAL LOW (ref >60.00)
Sodium: 141 mEq/L (ref 135–145)

## 2011-12-16 LAB — TSH: TSH: 0.21 u[IU]/mL — ABNORMAL LOW (ref 0.35–5.50)

## 2011-12-16 LAB — POTASSIUM: Potassium: 4.2 mEq/L (ref 3.5–5.1)

## 2011-12-16 NOTE — Progress Notes (Signed)
Patient was at office this am to have lab done,wanted to have B/P checked .

## 2011-12-26 ENCOUNTER — Emergency Department (HOSPITAL_COMMUNITY): Payer: Medicaid Other

## 2011-12-26 ENCOUNTER — Encounter (HOSPITAL_COMMUNITY): Payer: Self-pay | Admitting: Emergency Medicine

## 2011-12-26 ENCOUNTER — Emergency Department (HOSPITAL_COMMUNITY)
Admission: EM | Admit: 2011-12-26 | Discharge: 2011-12-26 | Disposition: A | Discharge status: Home / Self Care | Payer: Medicaid Other | Attending: Emergency Medicine | Admitting: Emergency Medicine

## 2011-12-26 DIAGNOSIS — R29898 Other symptoms and signs involving the musculoskeletal system: Secondary | ICD-10-CM | POA: Insufficient documentation

## 2011-12-26 DIAGNOSIS — I10 Essential (primary) hypertension: Secondary | ICD-10-CM | POA: Insufficient documentation

## 2011-12-26 DIAGNOSIS — M79609 Pain in unspecified limb: Secondary | ICD-10-CM | POA: Insufficient documentation

## 2011-12-26 DIAGNOSIS — R5383 Other fatigue: Secondary | ICD-10-CM | POA: Insufficient documentation

## 2011-12-26 DIAGNOSIS — W19XXXA Unspecified fall, initial encounter: Secondary | ICD-10-CM | POA: Insufficient documentation

## 2011-12-26 DIAGNOSIS — I69998 Other sequelae following unspecified cerebrovascular disease: Secondary | ICD-10-CM | POA: Insufficient documentation

## 2011-12-26 DIAGNOSIS — R5381 Other malaise: Secondary | ICD-10-CM | POA: Insufficient documentation

## 2011-12-26 DIAGNOSIS — M79606 Pain in leg, unspecified: Secondary | ICD-10-CM

## 2011-12-26 DIAGNOSIS — F17200 Nicotine dependence, unspecified, uncomplicated: Secondary | ICD-10-CM | POA: Insufficient documentation

## 2011-12-26 DIAGNOSIS — IMO0002 Reserved for concepts with insufficient information to code with codable children: Secondary | ICD-10-CM | POA: Insufficient documentation

## 2011-12-26 DIAGNOSIS — M25579 Pain in unspecified ankle and joints of unspecified foot: Secondary | ICD-10-CM | POA: Insufficient documentation

## 2011-12-26 DIAGNOSIS — I25810 Atherosclerosis of coronary artery bypass graft(s) without angina pectoris: Secondary | ICD-10-CM | POA: Insufficient documentation

## 2011-12-26 HISTORY — DX: Major depressive disorder, single episode, unspecified: F32.9

## 2011-12-26 HISTORY — DX: Depression, unspecified: F32.A

## 2011-12-26 MED ORDER — OXYCODONE-ACETAMINOPHEN 5-325 MG PO TABS
0.5000 | ORAL_TABLET | Freq: Once | ORAL | Status: AC
  Administered 2011-12-26: 0.5 via ORAL
  Filled 2011-12-26: qty 1

## 2011-12-26 MED ORDER — OXYCODONE-ACETAMINOPHEN 5-325 MG PO TABS: 0.5000 | ORAL_TABLET | Freq: Four times a day (QID) | ORAL | Status: DC | PRN

## 2011-12-26 NOTE — ED Notes (Signed)
Pt unable to place weight on right foot and unable to ambulate.

## 2011-12-26 NOTE — ED Provider Notes (Signed)
History     CSN: [id number]  Arrival date & time 12/26/11  1047   First MD Initiated Contact with Patient 12/26/11 1105      Chief Complaint  Patient presents with  . Fall  . Leg Pain    (Consider location/radiation/quality/duration/timing/severity/associated sxs/prior treatment) HPI Comments: Patient presents after a mechanical fall from standing prior to arrival. Patient states she has felt weak for 3 days. Patient states that her legs "gave out" and she fell onto her left side. Patient currently complains of pain in her right knee and ankle and also has a small abrasion to her left hand. Patient did not hit her head. She denies vision changes, neck pain, vomiting. No CP or SOB. No treatments prior to arrival. Walking makes R lower extremity pain worse. Nothing makes it better.   Patient is a 56 y.o. female presenting with fall and leg pain. The history is provided by the patient.  Fall The accident occurred 1 to 2 hours ago. The fall occurred while walking. She landed on a hard floor. The volume of blood lost was minimal. The point of impact was the left hip and left shoulder. The pain is present in the right knee. Pertinent negatives include no numbness. She has tried nothing for the symptoms.  Leg Pain  Pertinent negatives include no numbness.    Past Medical History  Diagnosis Date  . CAD (coronary artery disease)     cath 09/2010: LAD stent patent, S-Int/dCFX ok, S-PDA ok, L-LAD atretic  . Hypertension   . Hx of CABG   . Diastolic dysfunction     TEE 11/2010: EF 60-65%, BAE, trivial atrial septal shunt;  right heart cath in 10/2010 with elevated R and L heart pressures and diuretic started  . HLD (hyperlipidemia)   . Hypothyroidism   . Sleep apnea   . COPD (chronic obstructive pulmonary disease)   . Pulmonary nodules     repeat CT due in 11/2011  . Acute right MCA stroke 11/07/10  . 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 Eczema   . Depression     Past  Surgical History  Procedure Date  . Coronary artery bypass graft   . Debriment for infection in chest   . Chest wall reconstruction   . Bilateral knee surgery   . Bladder surgery   . Left mastoidectomy   . Hernia repair   . Cath 2012     Family History  Problem Relation Age of Onset  . COPD Mother   . Heart disease Mother   . Arthritis Mother     Rheumatoid and PMR  . Heart attack Father   . Depression Father   . Hypertension Father   . Osteoporosis Mother     Mom fractured hip  . Diabetes type II Mother     History  Substance Use Topics  . Smoking status: Current Some Day Smoker -- 1.0 packs/day for 30 years    Last Attempt to Quit: 06/14/2006  . Smokeless tobacco: Never Used  . Alcohol Use: No    OB History    Grav Para Term Preterm Abortions TAB SAB Ect Mult Living                  Review of Systems  Constitutional: Positive for fatigue. Negative for activity change.  HENT: Negative for neck pain.   Musculoskeletal: Positive for arthralgias and gait problem. Negative for back pain and joint swelling.  Skin: Positive for wound.  Neurological: Negative for weakness, light-headedness and numbness.    Allergies  Amoxicillin; Avelox; Hydrocodone; Penicillins; and Sulfa antibiotics  Home Medications   Current Outpatient Rx  Name Route Sig Dispense Refill  . ASPIRIN EC 81 MG PO TBEC Oral Take 81 mg by mouth daily.    [name] CLOPIDOGREL BISULFATE 75 MG PO TABS Oral Take 75 mg by mouth daily.    . DULOXETINE HCL 30 MG PO CPEP Oral Take 30 mg by mouth daily.    [name] ESCITALOPRAM OXALATE 10 MG PO TABS Oral Take 10 mg by mouth daily.    [name] FLUOCINONIDE 0.05 % EX OINT Topical Apply 1 application topically 2 (two) times daily.    [name] LEVOTHYROXINE SODIUM 125 MCG PO TABS Oral Take 125 mcg by mouth daily.    [name] MAGNESIUM OXIDE 400 MG PO TABS Oral Take 400 mg by mouth 2 (two) times daily.    [name] METOPROLOL SUCCINATE ER 25 MG PO TB24 Oral Take 25 mg by mouth daily.    [name] POTASSIUM  CHLORIDE CRYS ER 10 MEQ PO TBCR Oral Take 10 mEq by mouth 2 (two) times daily.    . QUETIAPINE FUMARATE ER 200 MG PO TB24 Oral Take 200 mg by mouth every evening.     [name] ROSUVASTATIN CALCIUM 5 MG PO TABS Oral Take 5 mg by mouth daily.    . SENNOSIDES 8.6 MG PO TABS Oral Take 1 tablet by mouth daily.      . OXYCODONE-ACETAMINOPHEN 5-325 MG PO TABS Oral Take 0.5 tablets by mouth every 6 (six) hours as needed for pain. 8 tablet 0    BP 101/73  Pulse 62  Temp(Src) 98 F (36.7 C) (Oral)  Resp 18  SpO2 100%  Physical Exam  Nursing note and vitals reviewed. Constitutional: She is oriented to person, place, and time. She appears well-developed and well-nourished.  HENT:  Head: Normocephalic and atraumatic.  Eyes: Pupils are equal, round, and reactive to light.  Neck: Normal range of motion. Neck supple.  Cardiovascular: Exam reveals no decreased pulses.   Pulses:      Radial pulses are 2+ on the right side, and 2+ on the left side.  Musculoskeletal: Normal range of motion. She exhibits tenderness. She exhibits no edema.       Right knee: She exhibits normal range of motion, no swelling and no effusion. tenderness found. Medial joint line and lateral joint line tenderness noted.       Right ankle: She exhibits normal range of motion and no swelling. tenderness. Lateral malleolus and medial malleolus tenderness found. No head of 5th metatarsal and no proximal fibula tenderness found. Achilles tendon normal.       Left hand: She exhibits tenderness and laceration (superficial abrasion). normal sensation noted. Normal strength noted.       Right lower leg: She exhibits tenderness. She exhibits no bony tenderness and no swelling.  Neurological: She is alert and oriented to person, place, and time. No sensory deficit.       Motor, sensation, and vascular distal to the injury is fully intact.   Skin: Skin is warm and dry.  Psychiatric: She has a normal mood and affect.    ED Course  Procedures  (including critical care time)  Labs Reviewed - No data to display Dg Ankle Complete Right  12/26/2011  *RADIOLOGY REPORT*  Clinical Data: Pain after fall  RIGHT ANKLE - COMPLETE 3+ VIEW  Comparison: None.  Findings: There are  fairly large posterior and plantar calcaneal spurs.  No evidence of fracture or dislocation.  The overlying soft tissues are unremarkable.  The ankle mortise is intact.  IMPRESSION:  There is no evidence of acute osseous injury.  Original Report Authenticated By: Brandon Melnick, M.D.     1. Leg pain    Patient seen and examined. Work-up initiated. Medications ordered.   Vital signs reviewed and are as follows: Filed Vitals:   12/26/11 1052  BP: 101/73  Pulse: 62  Temp: 98 F (36.7 C)  Resp: 18   X-ray negative. Pt informed. Pain medicine given in ED (percocet) and patient did not have itching. Order placed for wheelchair as pt has previous stroke with residual left-sided weakness and now has right foot pain.   Urged to return with worsening or other concerns. Allergy to hydrocodone, patient has had ultram in past but I'd like to avoid this as she is on several psychiatric medications. Patient tolerated percocet well in ED.   MDM  Patient after fall. No head or neck injury suspected. Ankle x-ray neg. Will treat patient conservatively with pain medicine and rest.         Renne Crigler, PA 12/26/11 1559

## 2011-12-26 NOTE — ED Notes (Signed)
Per EMS-  Pt has been weak and achy x3 days.  [facility] building today, pt stated  " my legs gave out".  When [facility], she was alert and oriented x3.  Pt has a hx of left sided weakness due to previous stroke in 2012.  Pt denies head injury, no swelling, or deformities.  Small puncture on left hand.

## 2011-12-26 NOTE — Progress Notes (Signed)
Received a return call from [facility] at [address] [place] [phone] who left a voice message at [phone] requesting at return call. CM attempted to return a call at [phone] but not answer at [facility]

## 2011-12-26 NOTE — Progress Notes (Signed)
ED CM spoke with pt after finding her waiting for [facility] transportation outside.  Pt discussed other needs of tub bench, reacher, Rx called in to pharmacy.  Confirmed with [facility] staff that pt will be charged for tub bench and reacher Pt informed.  Attempted to call in pt Percocet to [facility] on [address] [phone] narcotic can not be called in. Cm Left a voice message for Hervey Ard at [address] [place] [phone] including Cm return number

## 2011-12-26 NOTE — Progress Notes (Signed)
ED CM contacted by ED RN, [place] to assist with DME for d/c Orders to attempt ambulation Pt unable to bear wt.  Order for w/c Cm spoke with [facility] staff, Lucretia, for self pay pt referral.

## 2011-12-26 NOTE — Discharge Instructions (Signed)
Please read and follow all provided instructions.  Your diagnoses today include:  1. Leg pain     Tests performed today include:  X-ray of your ankle that showed no broken bones  Vital signs. See below for your results today.   Medications prescribed:   Percocet (oxycodone/acetaminophen) - narcotic pain medication  You have been prescribed narcotic pain medication such as Vicodin or Percocet: DO NOT drive or perform any activities that require you to be awake and alert because this medicine can make you drowsy. BE VERY CAREFUL not to take multiple medicines containing Tylenol (also called acetaminophen). Doing so can lead to an overdose which can damage your liver and cause liver failure and possibly death.  Discontinue if you have significant itching or a rash.   Take any prescribed medications only as directed.  Home care instructions:  Follow any educational materials contained in this packet.  BE VERY CAREFUL not to take multiple medicines containing Tylenol (also called acetaminophen). Doing so can lead to an overdose which can damage your liver and cause liver failure and possibly death.   Follow-up instructions: Please follow-up with your primary care provider in the next 3 days for further evaluation of your symptoms. If you do not have a primary care doctor -- see below for referral information.   Return instructions:   Please return to the Emergency Department if you experience worsening symptoms.   Please return if you have any other emergent concerns.  Additional Information:  Your vital signs today were: BP 101/73  Pulse 62  Temp(Src) 98 F (36.7 C) (Oral)  Resp 18  SpO2 100% If your blood pressure (BP) was elevated above 135/85 this visit, please have this repeated by your doctor within one month. -------------- No Primary Care Doctor Call [facility]  [phone] Other agencies that provide inexpensive medical care    [facility]   [phone]    [facility]  [phone]    [facility]  [phone]    [facility]  [phone]    [facility]  [phone]    [facility]  [phone] -------------- RESOURCE GUIDE:  Dental Problems  Patients with Medicaid: [facility] [phone] [address]                                            [phone] [address] Phone:  [phone]                                                   Phone:  [phone]  If unable to pay or uninsured, contact:  [facility] or [facility]. to become qualified for the adult dental clinic.  Chronic Pain Problems Contact [facility]  [phone] Patients need to be referred by their primary care doctor.  Insufficient Money for Medicine Contact United Way:  call "211" or [facility] ([phone].  [facility]  [phone] [facility]  [phone] [facility]   [phone] (emergency services [phone])  Substance Abuse Resources Alcohol and Drug  Services  4108209823 [facility] [phone] [facility] [phone] Floydene Flock [phone] Residential & Outpatient Substance Abuse Program  [phone]  Abuse/Neglect [facility] Child Abuse Hotline [phone] [facility] Child Abuse Hotline 629-248-0252 (After Hours)  Emergency Shelter [facility] 7695676546  Maternity Homes Room at the [place] [facility] [phone] [place] [facility] [phone]  [facility] Resources  [facility]     United [employer]                          [facility] Dept. [address] [place]                       [address]      [address]  Blondell Reveal Phone:  [phone]                                    Phone:  [phone]                 Phone:  [phone]  [facility] Phone:  [phone]  [facility] Child Abuse Hotline 726-604-5117 [phone] (After Hours)

## 2011-12-27 NOTE — ED Provider Notes (Signed)
Medical screening examination/treatment/procedure(s) were performed by non-physician practitioner and as supervising physician I was immediately available for consultation/collaboration.  Gerhard Munch, MD 12/27/11 [phone]

## 2011-12-30 ENCOUNTER — Encounter: Payer: Self-pay | Admitting: Internal Medicine

## 2011-12-30 ENCOUNTER — Ambulatory Visit (INDEPENDENT_AMBULATORY_CARE_PROVIDER_SITE_OTHER): Payer: Self-pay | Admitting: Internal Medicine

## 2011-12-30 VITALS — BP 132/88 | Temp 98.5°F | Wt 161.0 lb

## 2011-12-30 DIAGNOSIS — M6281 Muscle weakness (generalized): Secondary | ICD-10-CM

## 2011-12-30 DIAGNOSIS — Z8673 Personal history of transient ischemic attack (TIA), and cerebral infarction without residual deficits: Secondary | ICD-10-CM

## 2011-12-30 DIAGNOSIS — S8991XA Unspecified injury of right lower leg, initial encounter: Secondary | ICD-10-CM

## 2011-12-30 DIAGNOSIS — I10 Essential (primary) hypertension: Secondary | ICD-10-CM

## 2011-12-30 DIAGNOSIS — Z9181 History of falling: Secondary | ICD-10-CM

## 2011-12-30 DIAGNOSIS — I2510 Atherosclerotic heart disease of native coronary artery without angina pectoris: Secondary | ICD-10-CM

## 2011-12-30 DIAGNOSIS — Z590 Homelessness: Secondary | ICD-10-CM

## 2011-12-30 DIAGNOSIS — S8990XA Unspecified injury of unspecified lower leg, initial encounter: Secondary | ICD-10-CM

## 2011-12-30 NOTE — Progress Notes (Signed)
Subjective:    Patient ID: [name], female    DOB: 1955/10/17, 56 y.o.   MRN: [id number]  HPI  Comes in for fu of ed visit for falling  When felt weak  Outside [address] Where she is staying because she is homeless. People saw it happened.  No LOC. Felt like clammy.   Bp says has  High bp   ( nl last week and at hospital)  Still in homeless shelter   [employer].  No has wheel chair  And not a lot of access.   Unable to use cpap since January.   Needs new mask not enough  Money.   Last test jan  Time. Little sleep with the environment.    Roommate  Keeps lights on.   Afraid of the dark.   This makes this patient exhausted and sleep deprived.  Review of Systems Negative for fever sweats bleeding chest pain or shortness of breath that is new.  Past history family history social history reviewed in the electronic medical record.     Objective:   Physical Exam BP 132/88  Temp(Src) 98.5 F (36.9 C) (Oral)  Wt 161 lb (73.029 kg) Wt Readings from Last 3 Encounters:  12/30/11 161 lb (73.029 kg)  12/16/11 161 lb 6.4 oz (73.211 kg)  12/03/11 162 lb (73.483 kg)   well-developed alert in no acute distress except when manipulating her right ankle coherent. She is in a wheelchair HEENT is atraumatic. Cardiac regular rhythm. Extremity right lower shows an abrasion on the right proximal shin with significant swelling and hematoma underneath no signs of infection or redness. Right distal extremity shows +2 swelling around the ankle bilaterally medially and laterally tender on squeeze test and on right lateral malleolar. Pulses are present. Skin shows some scaling on both hands but minimal erythema a healed puncture wound on the left hand. Air cast large right was obtained and personally placed on her right ankle with some improvement however we did not give a trial of ambulation.    Assessment & Plan:   Status post fall that did not appear to be a cardiovascular neurologic  event but she felt weak and has left-sided weakness. She had sleep deprivation and untreated sleep apnea because she is living in a homeless shelter.  Evaluation emergency room showed a negative x-ray for fracture and she was given an Ace bandage and a wheelchair. However this has been problematic because she is not in a healthcare facility where she sleeps. She requests a note to give her medical permission to be inside the facility for rest during the day. She may have a high ankle sprain because of the location and swelling that she house if this is not improved in a reasonable amount of time we need to find a way to get her orthopedic care   I feel that she would benefit from occupational therapy and perhaps even PT with her ankle injury. Will refer to [facility]. The right side is her strong side which does make it problematic for mobility at this time. She has no insurance still has reapplied for disability that has been unbelievably denied.  Fortunately her last set of electrolytes were very good. Note written to be able to take her potassium pills with carbonated beverages which helps her swallow them easier. Prolonged visit and interview   Addendum: PATEINT seen in ed next day  back to ed with proximal fracture fibular  Uncertain follow up  with ortho  Will attempt office contact  her to see what  Is the disposition.  Facilitate if possible. Will hold on the OT PT until see what status is.

## 2011-12-30 NOTE — Patient Instructions (Signed)
Use the air cast splint for the next week or so you can take it off and on. It will fitting shoe. I will continue to elevate and use cold if possible.  Make a referral to neuro rehabilitation for OT and PT in regard to your left-sided weakness and now your right lower extremity injury.   I agree with you reapplying for disability.  If your right ankle if not improved in another week or so I recommend orthopedic evaluation. Letter as you requested.

## 2011-12-31 ENCOUNTER — Emergency Department (HOSPITAL_COMMUNITY): Payer: Medicaid Other

## 2011-12-31 ENCOUNTER — Encounter (HOSPITAL_COMMUNITY): Payer: Self-pay

## 2011-12-31 ENCOUNTER — Emergency Department (HOSPITAL_COMMUNITY)
Admission: EM | Admit: 2011-12-31 | Discharge: 2012-01-02 | Disposition: A | Discharge status: Home / Self Care | Payer: Medicaid Other | Attending: Internal Medicine | Admitting: Internal Medicine

## 2011-12-31 DIAGNOSIS — J4489 Other specified chronic obstructive pulmonary disease: Secondary | ICD-10-CM | POA: Insufficient documentation

## 2011-12-31 DIAGNOSIS — Z87891 Personal history of nicotine dependence: Secondary | ICD-10-CM | POA: Insufficient documentation

## 2011-12-31 DIAGNOSIS — S82831A Other fracture of upper and lower end of right fibula, initial encounter for closed fracture: Secondary | ICD-10-CM

## 2011-12-31 DIAGNOSIS — Z951 Presence of aortocoronary bypass graft: Secondary | ICD-10-CM | POA: Insufficient documentation

## 2011-12-31 DIAGNOSIS — F329 Major depressive disorder, single episode, unspecified: Secondary | ICD-10-CM | POA: Insufficient documentation

## 2011-12-31 DIAGNOSIS — M79609 Pain in unspecified limb: Secondary | ICD-10-CM | POA: Insufficient documentation

## 2011-12-31 DIAGNOSIS — R5381 Other malaise: Secondary | ICD-10-CM | POA: Insufficient documentation

## 2011-12-31 DIAGNOSIS — J449 Chronic obstructive pulmonary disease, unspecified: Secondary | ICD-10-CM | POA: Insufficient documentation

## 2011-12-31 DIAGNOSIS — I10 Essential (primary) hypertension: Secondary | ICD-10-CM | POA: Insufficient documentation

## 2011-12-31 DIAGNOSIS — I2510 Atherosclerotic heart disease of native coronary artery without angina pectoris: Secondary | ICD-10-CM | POA: Insufficient documentation

## 2011-12-31 DIAGNOSIS — E039 Hypothyroidism, unspecified: Secondary | ICD-10-CM | POA: Insufficient documentation

## 2011-12-31 DIAGNOSIS — Y9289 Other specified places as the place of occurrence of the external cause: Secondary | ICD-10-CM | POA: Insufficient documentation

## 2011-12-31 DIAGNOSIS — W19XXXA Unspecified fall, initial encounter: Secondary | ICD-10-CM | POA: Insufficient documentation

## 2011-12-31 DIAGNOSIS — S82839A Other fracture of upper and lower end of unspecified fibula, initial encounter for closed fracture: Secondary | ICD-10-CM | POA: Insufficient documentation

## 2011-12-31 DIAGNOSIS — R5383 Other fatigue: Secondary | ICD-10-CM | POA: Insufficient documentation

## 2011-12-31 DIAGNOSIS — E785 Hyperlipidemia, unspecified: Secondary | ICD-10-CM | POA: Insufficient documentation

## 2011-12-31 DIAGNOSIS — F3289 Other specified depressive episodes: Secondary | ICD-10-CM | POA: Insufficient documentation

## 2011-12-31 LAB — DIFFERENTIAL
Eosinophils Absolute: 0.2 10*3/uL (ref 0.0–0.7)
Lymphs Abs: 1.3 10*3/uL (ref 0.7–4.0)
Monocytes Relative: 8 % (ref 3–12)
Neutro Abs: 4.1 10*3/uL (ref 1.7–7.7)
Neutrophils Relative %: 67 % (ref 43–77)

## 2011-12-31 LAB — CBC
Hemoglobin: 12.2 g/dL (ref 12.0–15.0)
MCH: 30.6 pg (ref 26.0–34.0)
RBC: 3.99 MIL/uL (ref 3.87–5.11)

## 2011-12-31 LAB — BASIC METABOLIC PANEL
BUN: 16 mg/dL (ref 6–23)
Chloride: 103 mEq/L (ref 96–112)
Glucose, Bld: 94 mg/dL (ref 70–99)
Potassium: 3.9 mEq/L (ref 3.5–5.1)

## 2011-12-31 MED ORDER — HYDROMORPHONE HCL 2 MG PO TABS
2.0000 mg | ORAL_TABLET | ORAL | Status: DC | PRN
  Administered 2011-12-31 – 2012-01-02 (×8): 2 mg via ORAL
  Filled 2011-12-31 (×8): qty 1

## 2011-12-31 MED ORDER — LEVOTHYROXINE SODIUM 125 MCG PO TABS
125.0000 ug | ORAL_TABLET | Freq: Every day | ORAL | Status: DC
  Administered 2012-01-01 – 2012-01-02 (×2): 125 ug via ORAL
  Filled 2011-12-31 (×4): qty 1

## 2011-12-31 MED ORDER — OXYCODONE-ACETAMINOPHEN 5-325 MG PO TABS
0.5000 | ORAL_TABLET | Freq: Four times a day (QID) | ORAL | Status: DC | PRN
  Filled 2011-12-31: qty 1

## 2011-12-31 MED ORDER — METOPROLOL SUCCINATE ER 25 MG PO TB24
25.0000 mg | ORAL_TABLET | Freq: Every day | ORAL | Status: DC
Start: 2011-12-31 — End: 2012-01-02
  Administered 2012-01-01 – 2012-01-02 (×2): 25 mg via ORAL
  Filled 2011-12-31 (×3): qty 1

## 2011-12-31 MED ORDER — POTASSIUM CHLORIDE CRYS ER 20 MEQ PO TBCR
10.0000 meq | EXTENDED_RELEASE_TABLET | Freq: Two times a day (BID) | ORAL | Status: DC
  Administered 2011-12-31 – 2012-01-02 (×4): 10 meq via ORAL
  Filled 2011-12-31: qty 0.5
  Filled 2011-12-31 (×4): qty 1

## 2011-12-31 MED ORDER — ESCITALOPRAM OXALATE 10 MG PO TABS
10.0000 mg | ORAL_TABLET | Freq: Every day | ORAL | Status: DC
  Administered 2012-01-01 – 2012-01-02 (×2): 10 mg via ORAL
  Filled 2011-12-31 (×3): qty 1

## 2011-12-31 MED ORDER — MAGNESIUM OXIDE 400 MG PO TABS
400.0000 mg | ORAL_TABLET | Freq: Two times a day (BID) | ORAL | Status: DC
  Administered 2011-12-31 – 2012-01-02 (×4): 400 mg via ORAL
  Filled 2011-12-31 (×6): qty 1

## 2011-12-31 MED ORDER — ATORVASTATIN CALCIUM 10 MG PO TABS
10.0000 mg | ORAL_TABLET | Freq: Every day | ORAL | Status: DC
  Filled 2011-12-31 (×2): qty 1

## 2011-12-31 MED ORDER — ASPIRIN EC 81 MG PO TBEC
81.0000 mg | DELAYED_RELEASE_TABLET | Freq: Every day | ORAL | Status: DC
Start: 2011-12-31 — End: 2012-01-02
  Administered 2012-01-01 – 2012-01-02 (×2): 81 mg via ORAL
  Filled 2011-12-31 (×3): qty 1

## 2011-12-31 MED ORDER — DULOXETINE HCL 30 MG PO CPEP
30.0000 mg | ORAL_CAPSULE | Freq: Every day | ORAL | Status: DC
Start: 2011-12-31 — End: 2012-01-02
  Administered 2012-01-01 – 2012-01-02 (×2): 30 mg via ORAL
  Filled 2011-12-31 (×3): qty 1

## 2011-12-31 MED ORDER — QUETIAPINE FUMARATE ER 200 MG PO TB24
200.0000 mg | ORAL_TABLET | Freq: Every evening | ORAL | Status: DC
  Administered 2011-12-31: 200 mg via ORAL
  Filled 2011-12-31 (×3): qty 1

## 2011-12-31 MED ORDER — CLOPIDOGREL BISULFATE 75 MG PO TABS
75.0000 mg | ORAL_TABLET | Freq: Every day | ORAL | Status: DC
  Administered 2012-01-01 – 2012-01-02 (×2): 75 mg via ORAL
  Filled 2011-12-31 (×3): qty 1

## 2011-12-31 NOTE — ED Provider Notes (Addendum)
Consult from [facility] hospitalists appreciated. No medical indication for admission. I've also reviewed the x-rays and the fibular fracture is in an area that is nonweightbearing and does not require surgical management. Patient will be discharged.  Dione Booze, MD 12/31/11 1740 Case management has made arrangements for patient to be admitted to an assisted living facility but transfer cannot be done until morning. She will be placed in TCU overnight until the assisted living facility is ready to accept her.   Dione Booze, MD 12/31/11 1800

## 2011-12-31 NOTE — Progress Notes (Signed)
ED CM consulted with ED SW & EDP, Clarene Duke about pt.  Discussed on last ED visit pt made contact calls to son via her cell phone. CM asked pt if the option of staying with her son was available and she stated that his landlord will not let her stay if she is not on the lease.  Pt denied support other family, friends and church members.  CM offered suggestion of local hotel/motel stay with support from son until she is able to return to shelter.  Pt stated she has previously been in a [employer] handicap room.  CM informed her all hotels are accessed with handicap rooms so she does have a choice of hotel/motels.  Pt states son has helped her with her medications.  Pt seen by ED SW prior to ED CM visit.  Pt states she has a court date for medicaid pending.  CM encouraged pt to review her options for disposition and CM will assist if possible

## 2011-12-31 NOTE — ED Notes (Signed)
EDPA Josh at bedside. 

## 2011-12-31 NOTE — Progress Notes (Signed)
ED CM & SW spoke with pt after consulting with [facility] hospitalist MDs x 2 (who consulted with their director), SW supervisor, Director of [facility] and EDP.  [facility] staff will evaluate pt on 01/01/12 for possible placement as explained to pt by ED SW.  Pt states she believes she has seen [facility] while riding [facility].  Pt voiced understanding and agreed to possible placement if found a candidate to [facility] on 01/01/12.  CM encourage pt to assist also by contacting the "extended family" she presently mentions that "may be able to" assist her "in [place]" and if there is any other suggestion she could offer for disposition that CM/SW could coordinate services for.  Instructions provided on how to use ED room phone (her cell is needing to be charged) Still states son unable to assist. W/c and other personal belongings remain at shelter.  Pt voiced appreciation of assistance from ED CM and SW. Pt inquired about pain medication CM spoke with ED staff about pain medication request

## 2011-12-31 NOTE — Progress Notes (Signed)
CM spoke with pt again to offer assistance but informed she did not know what CM could assist with.  Requested CM have staff assist to bedside commode. CM informed CNA and RN.  Pt stated she saw pcp "yesterday" who provided a letter for her to the shelter stating she needed to rest for 20 hrs Shelter compiled but pt states her room mate kept lights on and she could not rest.

## 2011-12-31 NOTE — ED Notes (Signed)
Pt resides at the [facility]- Seen here last week for ankle injury and is currently in soft splint "air cast" to the right ankle. Pt presents with left sided weakness result from CVA not recent.  No new weakness- Pt 's mobility is with wheelchair. Per pt she is now unable to care for herself and has no where to go.  She reports she is here for placement to facility because she is unable to stay at current place

## 2011-12-31 NOTE — ED Notes (Signed)
Social work Mindi Junker has evaluated this pt
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
## 2011-12-31 NOTE — Progress Notes (Signed)
Gave pt list of extended hotel/motel in [place].  Cm inquired again if pt has suggestion for disposition that CM can assist with. Pt states her son who has called with director of shelter but not with resolution. Pt states again she has no other support systems.  CM updated EDP

## 2011-12-31 NOTE — Progress Notes (Signed)
CSW met with patient to assess needs. Patient reports that the [facility] is not equipped to handle any individual with a wheelchair and she would like to know what her options were. Patient reports she is waiting for her Medicaid Disability hearing date which should be come up soon. Reviewed in previous documentation that patient has a son in the area. When asked if patient can stay with her son temporarily, pt reported that his apartment is not equipped for her and his landlord will not permit her to move in.  Patient also mentioned her mother and sister who she reports have a strained relationship with. CSW encouraged patient to explore possibility of     Staying with her sister until her hearing, which she reported "Absolutely not."  CSW offered patient resources (Sports coach at [facility], Sports coach at [facility], Supportive Housing) in the community, all of which patient reported "They are not at all helpful" and refused to pursue any of them.  Patient asked CSW for a blanket, beverage and crackers. Notified patient's nurse.  CSW will speak with PA Josh about assessment and patient will be cleared from social worker side.  Manson Passey Irene Mitcham ANN S , MSW, LCSWA 12/31/2011 11:37 AM [phone]

## 2011-12-31 NOTE — Discharge Instructions (Signed)
Please read and follow all provided instructions.  Your diagnoses today include:  1. Fracture of right proximal fibula     Tests performed today include:  X-ray of leg that shows mildly displaced fracture of fibula  Blood counts and electrolytes that were normal.  Vital signs. See below for your results today.   Medications prescribed:   None   Home care instructions:  Follow any educational materials contained in this packet.  Follow-up instructions: Please follow-up with your primary care provider and the provided orthopedic physician (bone doctor) in the next 1 week for further evaluation of your symptoms. If you do not have a primary care doctor -- see below for referral information.   Return instructions:   Please return to the Emergency Department if you experience worsening symptoms.   Please return if you have any other emergent concerns.  Additional Information:  Your vital signs today were: BP 116/89  Pulse 58  Temp(Src) 98.6 F (37 C) (Oral)  Resp 18  SpO2 100% If your blood pressure (BP) was elevated above 135/85 this visit, please have this repeated by your doctor within one month. -------------- No Primary Care Doctor Call [facility]  [phone] Other agencies that provide inexpensive medical care    [facility]  [phone]    [facility]  [phone]    [facility]  [phone]    [facility]  [phone]    [facility]  [phone]    [facility]  [phone] -------------- RESOURCE GUIDE:  Dental Problems  Patients with Medicaid: [facility] [phone]                                            [phone] [address] Phone:  [phone]                                                   Phone:  [phone]  If unable to pay or uninsured, contact:  [facility] or [facility]. to become qualified for the adult dental  clinic.  Chronic Pain Problems Contact [facility]  [phone] Patients need to be referred by their primary care doctor.  Insufficient Money for Medicine Contact [employer]:  call "211" or [facility] [phone]) 702-1577.  Psychological Services [facility]  [phone] [facility]  [phone] [facility]   [phone] (emergency services [phone])  Substance Abuse Resources [facility]  [phone] [facility] [phone] [facility] [phone] Floydene Flock [phone] Residential & Outpatient Substance Abuse Program  [phone]  Abuse/Neglect [facility] Child Abuse Hotline 4637033281 [facility] Child Abuse Hotline (361)223-3222 (After Hours)  Emergency Shelter [facility] [phone])[phone]  Maternity Homes Room at the [facility] of [facility] [phone] [facility] [phone]  [facility] [place]  [facility] [address] [place]                       [address]      [address]  [place]                                                [place]                            [place] Phone:  [phone]                                   Phone:  [phone]                 Phone:  [phone]  [place] [facility] Phone:  [phone]  [place] Child Abuse Hotline (336) 342-1394 [phone]) [phone] (After Hours)    

## 2011-12-31 NOTE — ED Notes (Signed)
CSW was contacted by ED CM to assist with creating a safe and appropriate discharge plan for the pt. Pt has a fractured fibula, however does not meet criteria for a medical admission. Per pervious CSW notes, pt has currently been staying at [facility] though she is unable to return due to requiring more assistance in completing ADLs then [facility] is able to provide. Pt states that she has spoken with her son who has assisted her in the past with payment for medications to inquire as to whether he is able to provide assistance with living arrangements. According to pt, son is unable to allow the pt to stay with him due to issues with the apartment and landlord. Pt states that her sister is unable to assist as well. Pt has requested a stay in a ALF or SNF. Pt does not require a SNF at this time and currently has no insurance. Pt states that she has previously applied for Medicaid and was denied, however she has appealed this denial as of December 06, 2011. Pt states that she believes the name of her Medicaid worker at [employer]. DSS is Lavell Islam. Pt is unable to provide CSW with Curry's contact number.   CSW spoke with [facility] Hospitalist Dr. Tyrell Antonio, who states that the pt does not meet inpatient criteria and that Ortho states that there is no reason for admission either.   CSW contacted CSW Director, [facility], to discuss pt's situation to problem solve for a safe discharge plan. Per CSW Director, if [facility]F is able to accept pt for admission, CSW Director will provide ALF with a letter of guarantee for Medicaid. CSW contacted Haroon [phone]), director of admissions at [facility], who states that once he receives the pt's FL2 and reviews it the morning of 01/01/12, pt can be accepted with a Letter of Guarantee. CSW has completed pt's FL2 and is securing a PASSR #.   CSW has reviewed discharge plan with EDP who agrees that pt can remain in the ED overnight. CSW discussed plan with pt who is  agreeable and appreciative. CSW discussed with the pt the fact that she needs to continue contacting family members to discuss placement options in the event that she is not accepted by [facility]. Pt is agreeable and states that she has an extended family member that lives in [place], [place] that she may be able to contact. Pt still will not provide CSW with family member's contact info or provide consent to talk with family members.   CSW will continue to follow for safe and appropriate discharge plan.
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
## 2011-12-31 NOTE — ED Notes (Signed)
Pt c/o pain in right leg and unable to void. Pt refused to take oxycodone d/t pt states "it makes me feel funny and does not work for the pain." MD Preston Fleeting informed and and stated to place foley and he would change pain meds. Pt informed.

## 2012-01-01 NOTE — ED Notes (Signed)
Pt tried to use the bed pan, but still could not go.

## 2012-01-01 NOTE — Progress Notes (Signed)
This assessment/evaluation/note was completed by a Child psychotherapist of Social Work Warden/ranger and as Investment banker, corporate, I was immediately available for consultation/collaboration. I am in agreement with the contents and disposition reflected in the assessment/evaluation/note(s).   Manson Passey Subrena Devereux ANN S , MSW, LCSWA 01/01/2012 3:27 PM [phone]

## 2012-01-01 NOTE — Progress Notes (Signed)
ED CM consulted with ED SW about admission status (unchanged from 12/31/11-outpatient).  CM reviewed EPIC notes, labs, imaging from 12/31/11 Noted no labs, imaging for 01/01/12.  Pending PASSAR

## 2012-01-01 NOTE — Progress Notes (Signed)
This assessment/evaluation/note was completed by a Child psychotherapist of Social Work Warden/ranger and as Investment banker, corporate, I was immediately available for consultation/collaboration. I am in agreement with the contents and disposition reflected in the assessment/evaluation/note(s).   Manson Passey Marjan Rosman ANN S , MSW, LCSWA 01/01/2012 3:09 PM [phone]

## 2012-01-01 NOTE — Progress Notes (Signed)
Patient was assessed by Amy- Administrator of [facility]. They will accept patient pending removal of foley catheter and patient being able to void.  She has a PASARR number for SNF- will require a PASARR number for ALF's. PASARR initiated. Patient will require Home Health at facility; notified Selena Batten- [facility] who will arrange with [facility]. Patient and son are aware of above.  Facility is in standby for admission pending patient being able to void.  Patient requests Health Care Power of Attorney - to be given to her son. Bradly Bienenstock, SW Intern 01/01/2012 2:57 PM

## 2012-01-01 NOTE — Progress Notes (Signed)
Health Care Power of Attorney completed with patient. Copy to chart; original and copies given to son. Patient has not voided as of this time.  Son can be contacted via cell phone to assist with d/c.  Bradly Bienenstock, SW Intern 01/01/2012 3:22 PM
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
## 2012-01-01 NOTE — ED Notes (Signed)
[facility] representative here to see patient, son at bedside, patient repositioned and meal given

## 2012-01-01 NOTE — ED Notes (Signed)
Pt has been accepted to [facility] pending an ALF PASSR# and ability to void now that catheter removed. If pt is unable to void, she will require a higher level of care.   CSW contacted CSW Director, [facility], who states that if pt requires a SNF, a SNF letter of guarantee will be offered. CSW contacted [facility] who states that they are able to accept letters of guarantee and have bed availability. CSW will fax pt's FL2 to Arline Asp, Interior and spatial designer of Admissions at [facility], once ED RN confirms with CSW whether pt will need to have the catheter placed again.   CSW continues to follow to ensure safe and appropriate discharge plan.

## 2012-01-01 NOTE — Progress Notes (Signed)
ED CM spoke with Darl Pikes of [facility]) for referral for home health services for PT/OT

## 2012-01-01 NOTE — Progress Notes (Signed)
ED CM consulted by ED RN, Raynelle Fanning about re evaluation of ortho DME needs.  CM spoke with EDP, Freida Busman, about RN concern He states pt to be re evaluated.  Discussed home health services Orders entered in EPIC.

## 2012-01-01 NOTE — ED Notes (Signed)
Informed patient that her "daughter" had called and she adamantly states that she does not have a daughter and her son is not married and does not have a girlfirend, she states that it could have been her niece or her sister trying to get information, explained to patient that with HIPPA laws we are not allowed to give info over the phone and that we would refer the callers to her the patient to get info. Patient appreciative.

## 2012-01-01 NOTE — Progress Notes (Signed)
This assessment/evaluation/note was completed by a Child psychotherapist of Social Work Warden/ranger and as Investment banker, corporate, I was immediately available for consultation/collaboration. I am in agreement with the contents and disposition reflected in the assessment/evaluation/note(s).   Manson Passey Kaydynce Pat ANN S , MSW, LCSWA 01/01/2012 2:27 PM [phone]

## 2012-01-01 NOTE — ED Notes (Addendum)
Pt stated she did not want to eat anything, b/c the food smell bad.

## 2012-01-01 NOTE — ED Notes (Signed)
Patients daughter called to inquire about patient, stated that patient had her cell phone and she could call into her room and get an update from patient personally, daughter states that she will be coming to visit later today.

## 2012-01-01 NOTE — Progress Notes (Signed)
Call received from [facility], Health visitor. Director of [facility]. Patient's FL2 reviewed and discussed need for Letter of Guarantee. She will need to come and see patient today prior to accepting. Plans to come to hospital by 12 noon.  Will monitor and assist with d/c.  Bradly Bienenstock, SW Intern 01/01/2012 11:15 AM

## 2012-01-01 NOTE — Progress Notes (Signed)
Amy from [facility] in to talk to patient;  Her son is in room as well.  Patient has a foley cath and states that she cannot pivot bed to chair due to fx of right leg and generalized weakness on the right secondary to CVA.  Discussed briefly with Amy- these may present barriers to their ability to accept patient.  Bradly Bienenstock, SW Intern 01/01/2012 1:15 PM

## 2012-01-01 NOTE — Progress Notes (Signed)
This assessment/evaluation/note was completed by a Child psychotherapist of Social Work Warden/ranger and as Investment banker, corporate, I was immediately available for consultation/collaboration. I am in agreement with the contents and disposition reflected in the assessment/evaluation/note(s).   Manson Passey Elizeth Weinrich ANN S , MSW, LCSWA 01/01/2012 11:23 AM [phone]

## 2012-01-01 NOTE — ED Notes (Signed)
Lupita Leash, SW in to see patient

## 2012-01-02 LAB — URINE CULTURE: Culture  Setup Time: 201304240219

## 2012-01-02 MED ORDER — OXYCODONE-ACETAMINOPHEN 5-325 MG PO TABS: 0.5000 | ORAL_TABLET | Freq: Four times a day (QID) | ORAL | Status: DC | PRN

## 2012-01-02 NOTE — Progress Notes (Signed)
CSW checked for PASSAR number. Not in at this time. To continue to follow.  Teron Blais C. Tuck [place] MSW, LCSW [phone]

## 2012-01-02 NOTE — ED Notes (Signed)
Assumed care at this time. Received report from Logan, Charity fundraiser.

## 2012-01-02 NOTE — ED Notes (Signed)
Pt has still not voided per RN. CSW and RN spoke with EDP to see whether pt would require a catheter, however EDP states that is not necessary at this time. Pt is still awaiting placement due to inability to void and waiting ton PASSR#. Oncoming covering CSW has been informed of pt's ongoing placement needs and will follow up with pt on 01/02/12.

## 2012-01-02 NOTE — ED Notes (Signed)
Pt was able to void of urine.

## 2012-01-02 NOTE — Progress Notes (Addendum)
CSW conferenced with CSW director, [facility], Pt will  Need to go to SNF on a letter of guarantee and a 30 day note. CSW submitted 30 date note for SNF passar. Submitted information to [facility] for SNF with LOG.  Izabella Marcantel C. Arhianna Ebey MSW, LCSW [phone]

## 2012-01-02 NOTE — ED Notes (Signed)
I received this pt at approximately midnight. 2 meds (seroquel and lipitor) were ordered at 1830 but were not given. Pt is resting well and in no acute distress.

## 2012-01-02 NOTE — ED Provider Notes (Signed)
  Physical Exam  BP 132/82  Pulse 80  Temp(Src) 98.7 F (37.1 C) (Oral)  Resp 18  SpO2 99%  Physical Exam  ED Course  Procedures  MDM Patient has been accepted at the nursing home. Paperwork has been finished.      Juliet Rude. Rubin Payor, MD 01/02/12 1438

## 2012-01-02 NOTE — ED Provider Notes (Signed)
BP 148/97  Pulse 62  Temp(Src) 98.2 F (36.8 C) (Oral)  Resp 24  SpO2 100%  Discussed with nursing staff. Patient was able to urinate. She will possibly be placed at [facility] in the morning. SW following.  Forbes Cellar, MD 01/02/12 1914

## 2012-01-02 NOTE — Progress Notes (Signed)
Patient accepted to [facility] under letter of guarantee. 30 day passar received. conferenced case with CSW director [facility], she will fax LOG to [facility]. Met with patient. She is agreeable to same and will contact her son. Packet copied and given to nurse. [facility] called for transportation.  Vernida Mcnicholas C. Gearald Stonebraker MSW, LCSW [phone]

## 2012-01-02 NOTE — Progress Notes (Signed)
Late entry for 1535 01/02/12 Spoke with sus[facility] to update that pt will be d/c to snf

## 2012-01-04 DIAGNOSIS — S8991XA Unspecified injury of right lower leg, initial encounter: Secondary | ICD-10-CM | POA: Insufficient documentation

## 2012-01-04 DIAGNOSIS — Z9181 History of falling: Secondary | ICD-10-CM | POA: Insufficient documentation

## 2012-01-04 NOTE — Assessment & Plan Note (Signed)
Air cast elevation if not getting  better  Needs to see ortho.

## 2012-01-20 ENCOUNTER — Encounter: Payer: Self-pay | Admitting: Internal Medicine

## 2012-01-20 ENCOUNTER — Ambulatory Visit (INDEPENDENT_AMBULATORY_CARE_PROVIDER_SITE_OTHER): Payer: Self-pay | Admitting: Internal Medicine

## 2012-01-20 VITALS — BP 120/80 | HR 68 | Temp 98.6°F | Wt 174.0 lb

## 2012-01-20 DIAGNOSIS — Z8673 Personal history of transient ischemic attack (TIA), and cerebral infarction without residual deficits: Secondary | ICD-10-CM

## 2012-01-20 DIAGNOSIS — I509 Heart failure, unspecified: Secondary | ICD-10-CM

## 2012-01-20 DIAGNOSIS — I5032 Chronic diastolic (congestive) heart failure: Secondary | ICD-10-CM

## 2012-01-20 DIAGNOSIS — R439 Unspecified disturbances of smell and taste: Secondary | ICD-10-CM

## 2012-01-20 DIAGNOSIS — F4321 Adjustment disorder with depressed mood: Secondary | ICD-10-CM

## 2012-01-20 DIAGNOSIS — I2510 Atherosclerotic heart disease of native coronary artery without angina pectoris: Secondary | ICD-10-CM

## 2012-01-20 DIAGNOSIS — E039 Hypothyroidism, unspecified: Secondary | ICD-10-CM

## 2012-01-20 DIAGNOSIS — I25810 Atherosclerosis of coronary artery bypass graft(s) without angina pectoris: Secondary | ICD-10-CM

## 2012-01-20 DIAGNOSIS — Z9181 History of falling: Secondary | ICD-10-CM

## 2012-01-20 NOTE — Patient Instructions (Addendum)
You are due to get your thyroid and level checked next month.  Also repeat  chemistry panel.  All see if we can have Dr. Riley Kill softest check thyroid TSH along with her chemistry panel.  rov in 2 months or as needed otherwise

## 2012-01-22 ENCOUNTER — Encounter: Payer: Self-pay | Admitting: Internal Medicine

## 2012-01-22 ENCOUNTER — Other Ambulatory Visit: Payer: Self-pay | Admitting: Internal Medicine

## 2012-01-22 NOTE — Progress Notes (Signed)
  Subjective:    Patient ID: [name], female    DOB: 1956-06-10, 56 y.o.   MRN: [id number]  HPI [name] comes in today for followup of multiple medical problems. Since her last visit her right lower tremor the either was reentered her reevaluated and was found to have a proximal fibular fracture. She has been in a nursing home rehabilitation situation for the last couple weeks. She is now getting physical therapy and occupational therapy. Breathing stable at this time without wheezing. Chest pain her sternum is bothering him little more recently feels like things are pushing out of her chest. No obvious heart pain at this time. She's not had any falling because she is in nursing home. She's been given pain medication she gets itching and nausea with codeine derivatives was switched to the lauded with help but then switched back by Dr. Leanord Hawking to hydrocodone which causes diffuse itching. She's been placed on an antihistamine for the itching. She has a rash on her forearms. She's not having active nausea vomiting or new leg cramps. No significant change in her psychiatric medications. She continues to take her thyroid medicine every day and is due for thyroid check soon. Blood work was done at the nursing home but we do not have access to that.   Review of Systems No fever no new weight loss no change in her dysphasia taste or smell issues no current cough or choking.  Past history family history social history reviewed in the electronic medical record. It is unclear where she will be living after she is discharged from the nursing home. She is considering looking into staying with her aunt in [place]    Objective:   Physical Exam BP 120/80  Pulse 68  Temp(Src) 98.6 F (37 C) (Oral)  Wt 174 lb (78.926 kg)  SpO2 97% Well-developed lady in a wheelchair alert oriented x3 speech somewhat flat looks better than last visit. Normocephalic atraumatic chest clear to auscultation Cardiac S1-S2  don't hear gallop or murmur today sitting in wheelchair Extremities right lower extremity in Aircast with some edema. Skin normal perfusion thickened area over the dorsum of both hands that is improved and some petechial type lesions on both forearms but not on the rest of her body.  Nursing home meds list and forms reviewed.    Assessment & Plan:  Fracture right lower extremity from a fall  fortunately now she is able to get some physical therapy and occupational therapy  but only because of her fracture. Risk for falls status post CVA Hypertension much better possibly because of pain relief and rest Labs done in the emergency room show normal potassium and magnesium which is reassuring. Pulmonary stable at this time. CPAP OSA  Mood  continuing on medications per psychiatry Thyroid  last time she was a bit over suppressed   we are going to repeat after adjustments but it is too early today to repeat her TSH.  Like this repeated in the next 2-4 weeks she has an appointment with Dr. Riley Kill in June and perhaps we can arrange to get her TSH done at that visit. Itching probably from the narcotics given I suspect the rash on her forearms may be from scratching in her sleep I do not see other evidence of bleeding diathesis she is on Plavix and aspirin.

## 2012-02-06 ENCOUNTER — Telehealth: Payer: Self-pay | Admitting: Internal Medicine

## 2012-02-06 NOTE — Telephone Encounter (Signed)
Pt wants to know when she need to sch her next ov?

## 2012-02-06 NOTE — Telephone Encounter (Signed)
Per Dr. Rosezella Florida last office note pt is to be seen in 2 months.  Called and spoke with pt and pt is aware.

## 2012-02-26 ENCOUNTER — Ambulatory Visit: Payer: Self-pay | Admitting: Cardiology

## 2012-11-07 IMAGING — CT CT CHEST W/O CM
2 of 3 series · 15 of 36 positions shown, 18 images · IV contrast (Omnipaque 300)
Comparison: CT chest of 12/20/2009

CLINICAL DATA: Emphysema, cough, shortness of breath with exertion,
follow-up of lung nodule

CT CHEST WITHOUT CONTRAST
TECHNIQUE: Multidetector CT imaging of the chest was performed
following the standard protocol without IV contrast.

[Series 2: chest routine with · axial · 0.72mm/px · z∈[-277,-47]mm · 12 of 54 slices shown, 15 images]
[im 4/54  mediastinal]
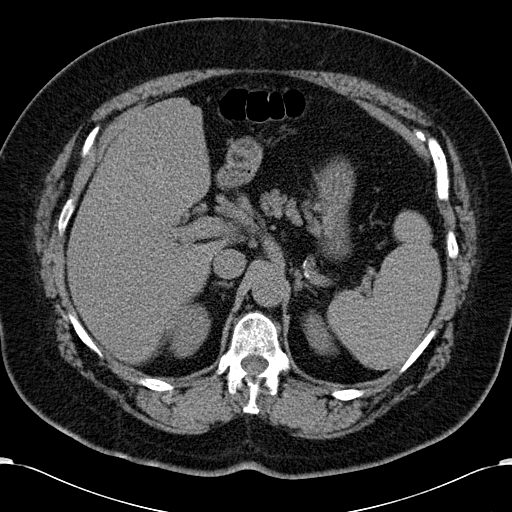
[im 4/54  lung]
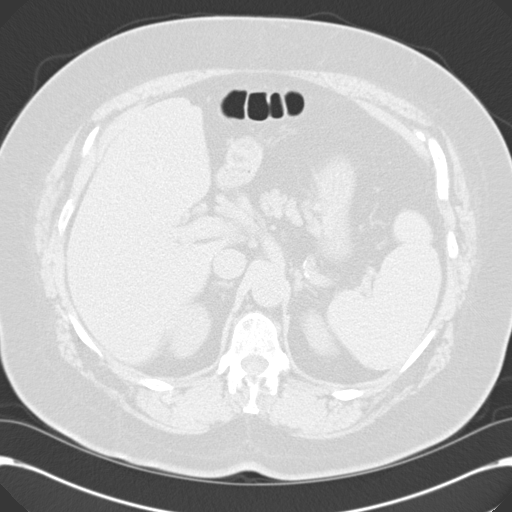
[im 8/54  lung]
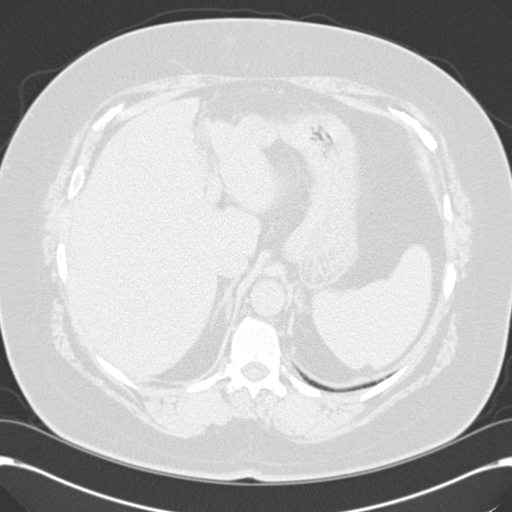
[im 12/54  lung]
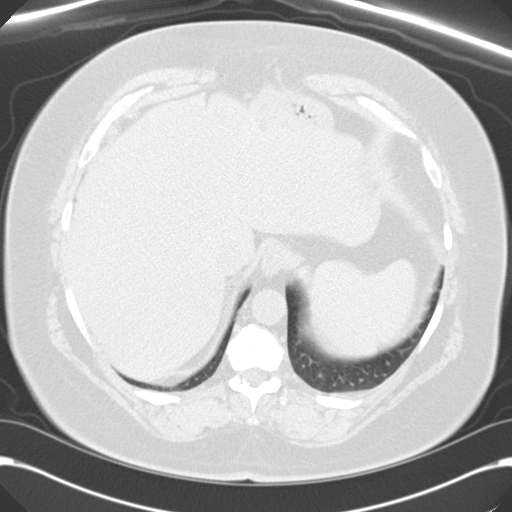
[im 16/54  lung]
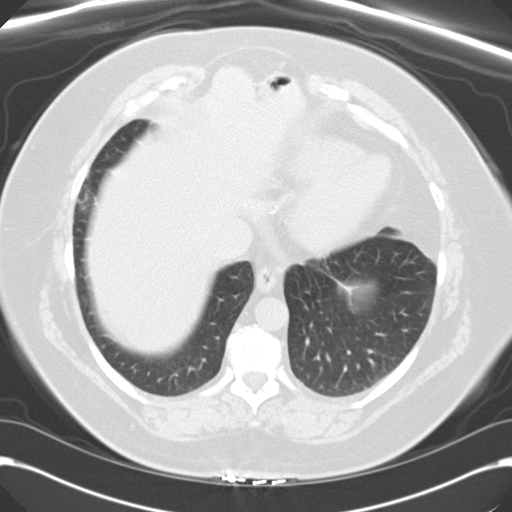
[im 20/54  mediastinal]
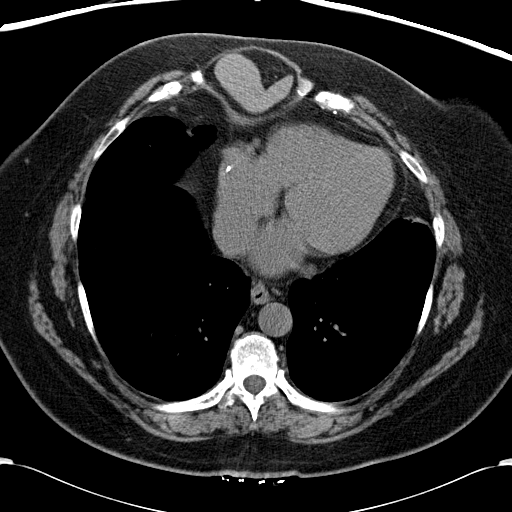
[im 20/54  lung]
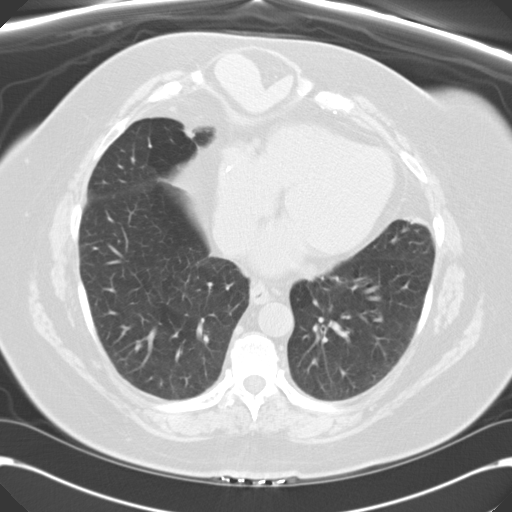
[im 24/54  lung]
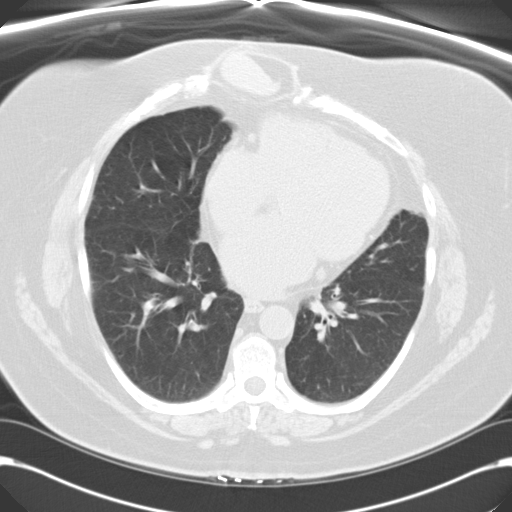
[im 30/54  lung]
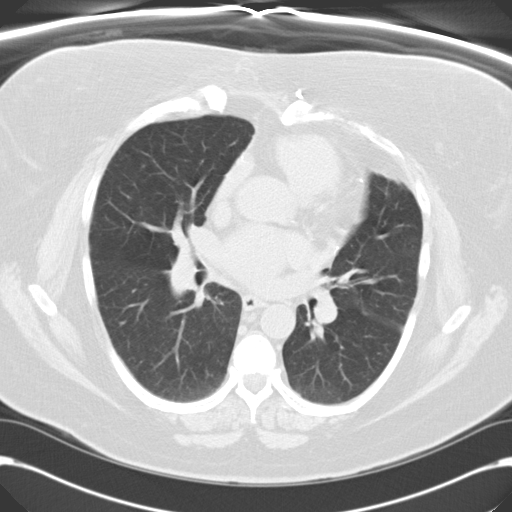
[im 34/54  lung]
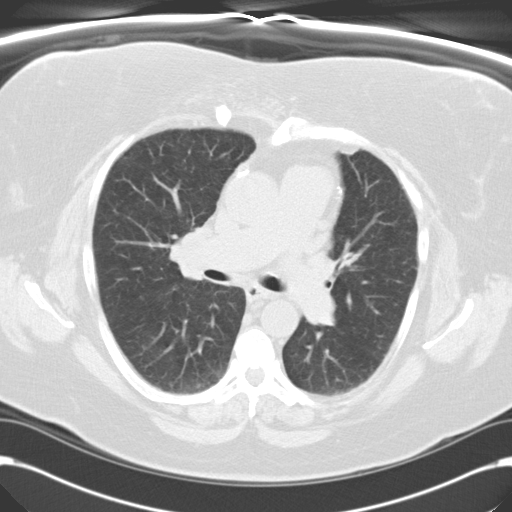
[im 38/54  mediastinal]
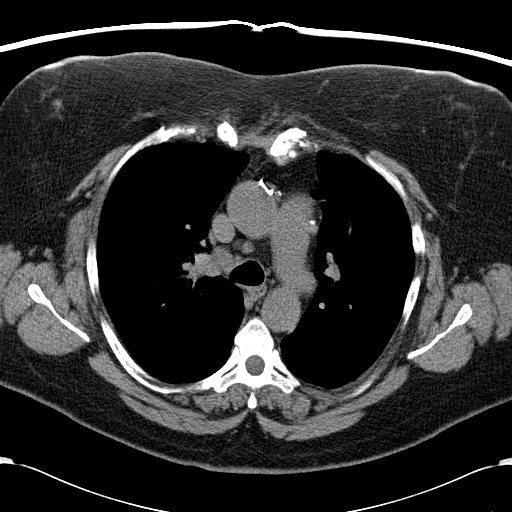
[im 38/54  lung]
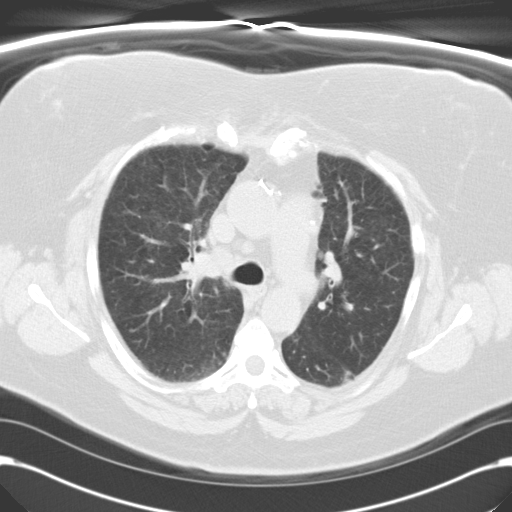
[im 42/54  lung]
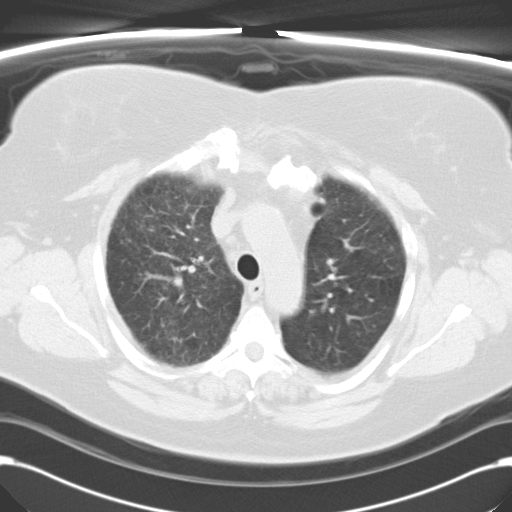
[im 46/54  lung]
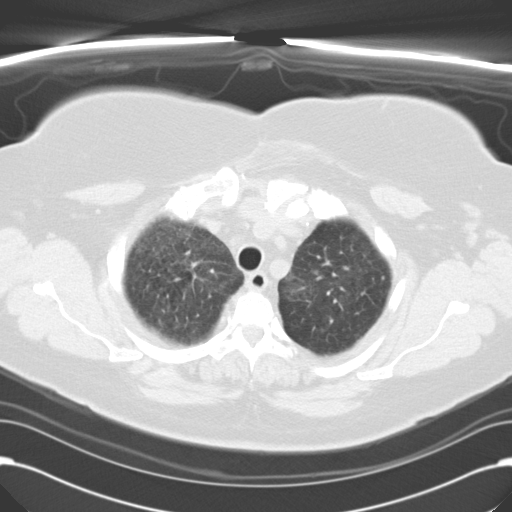
[im 50/54  lung]
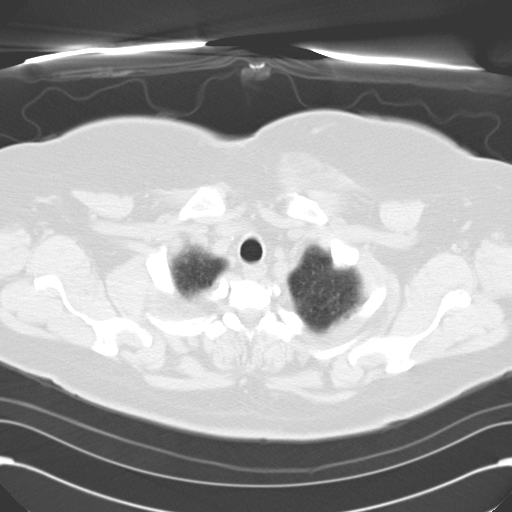

[Series 602: <mpr thick range> · coronal · 0.72mm/px · 3 of 138 slices shown]
[im 28/138  lung]
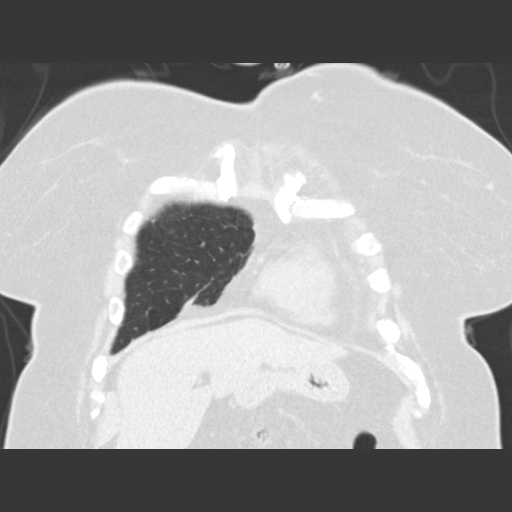
[im 55/138  lung]
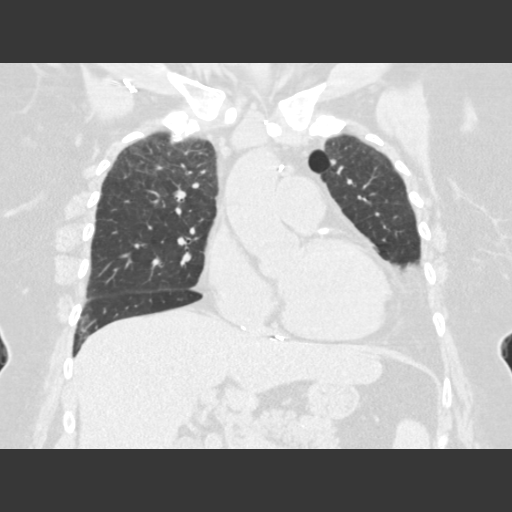
[im 83/138  lung]
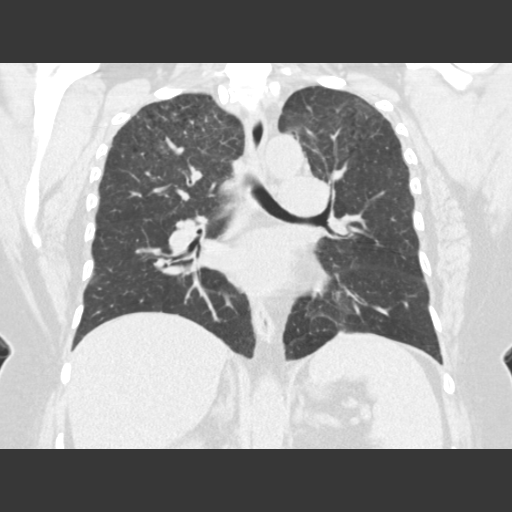

[15 of 36 positions shown; findings below may reference images not displayed]

FINDINGS: Diffuse changes of emphysema are noted particularly
involving the upper lobes.  A 5 mm noncalcified nodule is stable
within the anterior medial left upper lobe.  Ground-glass opacity
posteriorly in the superior segment of the left lower lobe is
relatively stable and may represent scarring.  Continued follow up
of this area is recommended to exclude a developing bronchoalveolar
cell carcinoma which is thought unlikely.  Follow-up is recommended
in 6-12 months.  No pleural effusion is seen.

On soft tissue window images, the right hilar node previously noted
has short-axis diameter of 12 mm compared 11 mm previously, not
significantly changed.  There is a slightly more prominent
precarinal node of 9 x 17 mm compared to my measurements previously
of 9 x 13 mm.  No definite mediastinal or hilar adenopathy is seen.
Coronary artery calcifications are present.  Laxity of the fascial
planes of the upper abdomen in the midline anteriorly appears
stable.
IMPRESSION: 1.  Pleural based ground-glass opacity in the superior segment of
the left lower lobe may represent scarring.  Recommend continued
follow up of this area with CT chest scan and 6-12 months as noted
above.
2.  Stable 5 mm noncalcified nodule in the anterior medial left
upper lobe.
3.  Diffuse changes of emphysema.
4.  Coronary artery calcifications.

## 2013-05-03 DIAGNOSIS — F0630 Mood disorder due to known physiological condition, unspecified: Secondary | ICD-10-CM | POA: Diagnosis not present

## 2013-05-04 DIAGNOSIS — F329 Major depressive disorder, single episode, unspecified: Secondary | ICD-10-CM | POA: Diagnosis not present

## 2013-05-04 DIAGNOSIS — E876 Hypokalemia: Secondary | ICD-10-CM | POA: Diagnosis not present

## 2013-05-04 DIAGNOSIS — E7800 Pure hypercholesterolemia, unspecified: Secondary | ICD-10-CM | POA: Diagnosis not present

## 2013-05-04 DIAGNOSIS — Z780 Asymptomatic menopausal state: Secondary | ICD-10-CM | POA: Diagnosis not present

## 2013-05-04 DIAGNOSIS — Z9989 Dependence on other enabling machines and devices: Secondary | ICD-10-CM | POA: Diagnosis not present

## 2013-05-06 DIAGNOSIS — R3989 Other symptoms and signs involving the genitourinary system: Secondary | ICD-10-CM | POA: Diagnosis not present

## 2013-05-07 DIAGNOSIS — M25539 Pain in unspecified wrist: Secondary | ICD-10-CM | POA: Diagnosis not present

## 2013-05-07 DIAGNOSIS — J449 Chronic obstructive pulmonary disease, unspecified: Secondary | ICD-10-CM | POA: Diagnosis not present

## 2013-05-07 DIAGNOSIS — I2510 Atherosclerotic heart disease of native coronary artery without angina pectoris: Secondary | ICD-10-CM | POA: Diagnosis not present

## 2013-05-07 DIAGNOSIS — R079 Chest pain, unspecified: Secondary | ICD-10-CM | POA: Diagnosis not present

## 2013-05-07 DIAGNOSIS — Z886 Allergy status to analgesic agent status: Secondary | ICD-10-CM | POA: Diagnosis not present

## 2013-05-07 DIAGNOSIS — S20219A Contusion of unspecified front wall of thorax, initial encounter: Secondary | ICD-10-CM | POA: Diagnosis not present

## 2013-05-07 DIAGNOSIS — I10 Essential (primary) hypertension: Secondary | ICD-10-CM | POA: Diagnosis not present

## 2013-05-07 DIAGNOSIS — Z7982 Long term (current) use of aspirin: Secondary | ICD-10-CM | POA: Diagnosis not present

## 2013-05-07 DIAGNOSIS — Z951 Presence of aortocoronary bypass graft: Secondary | ICD-10-CM | POA: Diagnosis not present

## 2013-05-07 DIAGNOSIS — I69998 Other sequelae following unspecified cerebrovascular disease: Secondary | ICD-10-CM | POA: Diagnosis not present

## 2013-05-07 DIAGNOSIS — S79919A Unspecified injury of unspecified hip, initial encounter: Secondary | ICD-10-CM | POA: Diagnosis not present

## 2013-05-07 DIAGNOSIS — S7000XA Contusion of unspecified hip, initial encounter: Secondary | ICD-10-CM | POA: Diagnosis not present

## 2013-05-07 DIAGNOSIS — Z79899 Other long term (current) drug therapy: Secondary | ICD-10-CM | POA: Diagnosis not present

## 2013-05-07 DIAGNOSIS — R5381 Other malaise: Secondary | ICD-10-CM | POA: Diagnosis not present

## 2013-05-31 DIAGNOSIS — R413 Other amnesia: Secondary | ICD-10-CM | POA: Diagnosis not present

## 2013-05-31 DIAGNOSIS — J449 Chronic obstructive pulmonary disease, unspecified: Secondary | ICD-10-CM | POA: Diagnosis not present

## 2013-05-31 DIAGNOSIS — I6350 Cerebral infarction due to unspecified occlusion or stenosis of unspecified cerebral artery: Secondary | ICD-10-CM | POA: Diagnosis not present

## 2013-05-31 DIAGNOSIS — I2510 Atherosclerotic heart disease of native coronary artery without angina pectoris: Secondary | ICD-10-CM | POA: Diagnosis not present

## 2013-05-31 DIAGNOSIS — R259 Unspecified abnormal involuntary movements: Secondary | ICD-10-CM | POA: Diagnosis not present

## 2013-05-31 DIAGNOSIS — M6281 Muscle weakness (generalized): Secondary | ICD-10-CM | POA: Diagnosis not present

## 2013-05-31 DIAGNOSIS — I69998 Other sequelae following unspecified cerebrovascular disease: Secondary | ICD-10-CM | POA: Diagnosis not present

## 2013-06-04 DIAGNOSIS — M899 Disorder of bone, unspecified: Secondary | ICD-10-CM | POA: Diagnosis not present

## 2013-06-15 DIAGNOSIS — F332 Major depressive disorder, recurrent severe without psychotic features: Secondary | ICD-10-CM | POA: Diagnosis not present

## 2013-07-19 DIAGNOSIS — E039 Hypothyroidism, unspecified: Secondary | ICD-10-CM | POA: Diagnosis not present

## 2013-08-17 DIAGNOSIS — F332 Major depressive disorder, recurrent severe without psychotic features: Secondary | ICD-10-CM | POA: Diagnosis not present

## 2013-10-28 ENCOUNTER — Ambulatory Visit (INDEPENDENT_AMBULATORY_CARE_PROVIDER_SITE_OTHER): Payer: Medicare Other | Admitting: Emergency Medicine

## 2013-10-28 VITALS — BP 140/86 | HR 73 | Temp 98.3°F | Resp 16 | Ht 63.0 in | Wt 171.6 lb

## 2013-10-28 DIAGNOSIS — I2510 Atherosclerotic heart disease of native coronary artery without angina pectoris: Secondary | ICD-10-CM

## 2013-10-28 DIAGNOSIS — I10 Essential (primary) hypertension: Secondary | ICD-10-CM | POA: Diagnosis not present

## 2013-10-28 DIAGNOSIS — Z5900 Homelessness unspecified: Secondary | ICD-10-CM

## 2013-10-28 DIAGNOSIS — Z8673 Personal history of transient ischemic attack (TIA), and cerebral infarction without residual deficits: Secondary | ICD-10-CM

## 2013-10-28 DIAGNOSIS — F4321 Adjustment disorder with depressed mood: Secondary | ICD-10-CM

## 2013-10-28 DIAGNOSIS — F17200 Nicotine dependence, unspecified, uncomplicated: Secondary | ICD-10-CM | POA: Diagnosis not present

## 2013-10-28 MED ORDER — ASPIRIN EC 81 MG PO TBEC: 81.0000 mg | DELAYED_RELEASE_TABLET | Freq: Every day | ORAL | Status: DC

## 2013-10-28 MED ORDER — DULOXETINE HCL 30 MG PO CPEP: 60.0000 mg | ORAL_CAPSULE | Freq: Two times a day (BID) | ORAL | Status: DC

## 2013-10-28 MED ORDER — PANTOPRAZOLE SODIUM 40 MG PO TBEC: 40.0000 mg | DELAYED_RELEASE_TABLET | Freq: Every day | ORAL | Status: DC

## 2013-10-28 MED ORDER — ATORVASTATIN CALCIUM 40 MG PO TABS: 40.0000 mg | ORAL_TABLET | Freq: Every day | ORAL | Status: DC

## 2013-10-28 MED ORDER — CLOPIDOGREL BISULFATE 75 MG PO TABS: 75.0000 mg | ORAL_TABLET | Freq: Every day | ORAL | Status: DC

## 2013-10-28 MED ORDER — ARIPIPRAZOLE 10 MG PO TABS: 10.0000 mg | ORAL_TABLET | Freq: Every day | ORAL | Status: DC

## 2013-10-28 MED ORDER — LISINOPRIL 20 MG PO TABS: 20.0000 mg | ORAL_TABLET | Freq: Every day | ORAL | Status: DC

## 2013-10-28 NOTE — Progress Notes (Signed)
[facility] [address], [place] [address] [phone]  Date:  10/28/2013   Name:  [name]   DOB:  1955-12-15   MRN:  [id number]  PCP:  [name], MD    Chief Complaint: Doesnt feel right   History of Present Illness:  [name] is a 58 y.o. very pleasant female patient who presents with the following:  Multiple chronic medical problems.  Is living in a motel now.  Smoker.  History of CVA and CAD.  Disabled.  Has been out of medication for at least two months and requests refills on her medications. She has no acute complaint.  No improvement with over the counter medications or other home remedies. Denies other complaint or health concern today.   Patient Active Problem List   Diagnosis Date Noted  . Right leg injury 01/04/2012  . Hx of completed stroke 01/04/2012  . Hx of falling, presenting hazards to health 01/04/2012  . Homelessness 11/12/2011  . Eczema 11/12/2011  . Dysuria 10/12/2011  . Muscle weakness (generalized) 08/16/2011  . Urinary retention with incomplete bladder emptying 06/03/2011  . Protein calorie malnutrition 06/03/2011  . Neck pain 04/05/2011  . Constipation 04/05/2011  . Hypokalemia 04/05/2011  . History of CVA (cerebrovascular accident) 04/05/2011  . Abnormal smell 03/24/2011  . Smell and taste disorder 03/24/2011  . Muscle spasm 01/31/2011  . Intertrigo 01/31/2011  . Urinary frequency 01/18/2011  . Sleep apnea   . Palpitations 12/18/2010  . CAD (coronary artery disease)   . Hypertension   . Chronic diastolic heart failure   . Pulmonary nodules   . CVA (cerebral infarction) 11/15/2010  . Acute right MCA stroke 11/07/2010  . HYPOMAGNESEMIA 10/02/2010  . SIALOLITHIASIS 09/22/2010  . CHEST PAIN 09/05/2010  . MUSCLE CRAMPS 06/14/2010  . DYSPNEA 07/21/2009  . ADVERSE REACTION TO MEDICATION 10/25/2008  . DISORDER, ADJUSTMENT W/DEPRESSED MOOD 07/06/2007  . HEADACHE 07/06/2007  . CHEST WALL PAIN,  ANTERIOR 07/06/2007  . MIGRAINE HEADACHE 03/30/2007  . HYPERGLYCEMIA, FASTING 03/30/2007  . HYPOTHYROIDISM 10/28/2006  . HYPERLIPIDEMIA 10/28/2006  . OBSTRUCTIVE SLEEP APNEA 10/28/2006  . ASTHMA 10/28/2006  . ARTHROSCOPY, HX OF 10/28/2006  . Other Postprocedural Status 10/28/2006  . OSTEOMYELITIS, ACUTE, OTHER [facility] SITE 09/13/2006  . ATHEROSCLEROSIS, CORONARY, ARTERY BYPASS GRFT 07/29/2006    Past Medical History  Diagnosis Date  . CAD (coronary artery disease)     cath 09/2010: LAD stent patent, S-Int/dCFX ok, S-PDA ok, L-LAD atretic  . Hypertension   . Hx of CABG   . Diastolic dysfunction     TEE 11/2010: EF 60-65%, BAE, trivial atrial septal shunt;  right heart cath in 10/2010 with elevated R and L heart pressures and diuretic started  . HLD (hyperlipidemia)   . Hypothyroidism   . Sleep apnea   . COPD (chronic obstructive pulmonary disease)   . Pulmonary nodules     repeat CT due in 11/2011  . Acute right MCA stroke 11/07/10  . 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 Eczema   . Depression     Past Surgical History  Procedure Laterality Date  . Coronary artery bypass graft    . Debriment for infection in chest    . Chest wall reconstruction    . Bilateral knee surgery    . Bladder surgery    . Left mastoidectomy    . Hernia repair    . Cath 2012  History  Substance Use Topics  . Smoking status: Current Every Day Smoker -- 0.25 packs/day for 30 years  . Smokeless tobacco: Never Used  . Alcohol Use: No    Family History  Problem Relation Age of Onset  . COPD Mother   . Heart disease Mother   . Arthritis Mother     Rheumatoid and PMR  . Heart attack Father   . Depression Father   . Hypertension Father   . Osteoporosis Mother     Mom fractured hip  . Diabetes type II Mother     Allergies  Allergen Reactions  . Amoxicillin Hives  . Avelox [Moxifloxacin Hcl In Nacl] Other (See Comments)    Mental breakdown.  [name]. Hydrocodone Itching  .  Penicillins Hives  . Sulfa Antibiotics Other (See Comments)    unknown    Medication list has been reviewed and updated.  Current Outpatient Prescriptions on File Prior to Visit  Medication Sig Dispense Refill  . aspirin EC 81 MG tablet Take 81 mg by mouth daily.      . DULoxetine (CYMBALTA) 30 MG capsule Take 60 mg by mouth 2 (two) times daily.       [name]. levothyroxine (SYNTHROID, LEVOTHROID) 125 MCG tablet Take 125 mcg by mouth daily.      . clopidogrel (PLAVIX) 75 MG tablet Take 75 mg by mouth daily.      . fluocinonide ointment (LIDEX) 0.05 % Apply 1 application topically 2 (two) times daily.      . magnesium oxide (MAG-OX) 400 MG tablet Take 400 mg by mouth 2 (two) times daily.      . metoprolol succinate (TOPROL-XL) 25 MG 24 hr tablet Take 25 mg by mouth daily.      . potassium chloride (K-DUR,KLOR-CON) 10 MEQ tablet Take 10 mEq by mouth 2 (two) times daily.      . QUEtiapine (SEROQUEL) 400 MG tablet Take 400 mg by mouth at bedtime.      . rosuvastatin (CRESTOR) 5 MG tablet Take 5 mg by mouth daily.       No current facility-administered medications on file prior to visit.    Review of Systems:  As per HPI, otherwise negative.    Physical Examination: Filed Vitals:   10/28/13 2016  BP: 140/86  Pulse: 73  Temp: 98.3 F (36.8 C)  Resp: 16   Filed Vitals:   10/28/13 2016  Height: 5\' 3"  (1.6 m)  Weight: 171 lb 9.6 oz (77.837 kg)   Body mass index is 30.41 kg/(m^2). Ideal Body Weight: Weight in (lb) to have BMI = 25: 140.8  GEN: WDWN, NAD, Non-toxic, A & O x 3 HEENT: Atraumatic, Normocephalic. Neck supple. No masses, No LAD. Ears and Nose: No external deformity. CV: RRR, No M/G/R. No JVD. No thrill. No extra heart sounds. PULM: CTA B, no wheezes, crackles, rhonchi. No retractions. No resp. distress. No accessory muscle use. ABD: S, NT, ND, +BS. No rebound. No HSM. EXTR: No c/c/e NEURO Normal gait.  PSYCH: Normally interactive. Conversant. Not depressed or anxious  appearing.  Calm demeanor.    Assessment and Plan: Depression Hypertension Hypothyroid CVA CAD MUST come in one month from now fasting for labs   Signed,  Phillips OdorJeffery Derinda Bartus, MD

## 2013-10-28 NOTE — Patient Instructions (Signed)

## 2013-10-29 ENCOUNTER — Telehealth: Payer: Self-pay

## 2013-10-29 NOTE — Telephone Encounter (Signed)
Pharm faxed req for PA for cymbalta. Called [place] [employer] and was advised that if pharm runs through w/brand name it will be covered w/out a PA. Called pharm who stated it is still giving them problems but they will contact pharm help desk if needed.

## 2013-11-16 ENCOUNTER — Ambulatory Visit (INDEPENDENT_AMBULATORY_CARE_PROVIDER_SITE_OTHER): Payer: Medicare Other | Admitting: Family Medicine

## 2013-11-16 VITALS — BP 132/92 | HR 59 | Temp 98.2°F | Resp 16 | Ht 64.25 in | Wt 177.0 lb

## 2013-11-16 DIAGNOSIS — R071 Chest pain on breathing: Secondary | ICD-10-CM | POA: Diagnosis not present

## 2013-11-16 DIAGNOSIS — F329 Major depressive disorder, single episode, unspecified: Secondary | ICD-10-CM | POA: Diagnosis not present

## 2013-11-16 DIAGNOSIS — F3289 Other specified depressive episodes: Secondary | ICD-10-CM

## 2013-11-16 DIAGNOSIS — I25810 Atherosclerosis of coronary artery bypass graft(s) without angina pectoris: Secondary | ICD-10-CM

## 2013-11-16 DIAGNOSIS — K219 Gastro-esophageal reflux disease without esophagitis: Secondary | ICD-10-CM

## 2013-11-16 DIAGNOSIS — H6690 Otitis media, unspecified, unspecified ear: Secondary | ICD-10-CM

## 2013-11-16 DIAGNOSIS — I6350 Cerebral infarction due to unspecified occlusion or stenosis of unspecified cerebral artery: Secondary | ICD-10-CM | POA: Diagnosis not present

## 2013-11-16 DIAGNOSIS — I639 Cerebral infarction, unspecified: Secondary | ICD-10-CM

## 2013-11-16 DIAGNOSIS — Z0000 Encounter for general adult medical examination without abnormal findings: Secondary | ICD-10-CM

## 2013-11-16 DIAGNOSIS — E039 Hypothyroidism, unspecified: Secondary | ICD-10-CM | POA: Diagnosis not present

## 2013-11-16 DIAGNOSIS — R0789 Other chest pain: Secondary | ICD-10-CM

## 2013-11-16 DIAGNOSIS — F32A Depression, unspecified: Secondary | ICD-10-CM

## 2013-11-16 LAB — POCT URINALYSIS DIPSTICK
Bilirubin, UA: NEGATIVE
Blood, UA: NEGATIVE
Glucose, UA: NEGATIVE
Ketones, UA: NEGATIVE
Nitrite, UA: NEGATIVE
Spec Grav, UA: 1.025
Urobilinogen, UA: 0.2
pH, UA: 6

## 2013-11-16 LAB — POCT CBC
Granulocyte percent: 65.1 %G (ref 37–80)
HCT, POC: 40.9 % (ref 37.7–47.9)
Hemoglobin: 13.1 g/dL (ref 12.2–16.2)
Lymph, poc: 1.9 (ref 0.6–3.4)
MCH, POC: 31.2 pg (ref 27–31.2)
MCHC: 32 g/dL (ref 31.8–35.4)
MCV: 97.5 fL — AB (ref 80–97)
MID (cbc): 0.5 (ref 0–0.9)
MPV: 10 fL (ref 0–99.8)
POC Granulocyte: 4.4 (ref 2–6.9)
POC LYMPH PERCENT: 27.8 %L (ref 10–50)
POC MID %: 7.1 %M (ref 0–12)
Platelet Count, POC: 232 10*3/uL (ref 142–424)
RBC: 4.2 M/uL (ref 4.04–5.48)
RDW, POC: 16.9 %
WBC: 6.8 10*3/uL (ref 4.6–10.2)

## 2013-11-16 LAB — COMPREHENSIVE METABOLIC PANEL
ALT: 8 U/L (ref 0–35)
AST: 13 U/L (ref 0–37)
Albumin: 4.2 g/dL (ref 3.5–5.2)
Alkaline Phosphatase: 61 U/L (ref 39–117)
BUN: 13 mg/dL (ref 6–23)
CO2: 31 mEq/L (ref 19–32)
Calcium: 8.9 mg/dL (ref 8.4–10.5)
Chloride: 102 mEq/L (ref 96–112)
Creat: 1.01 mg/dL (ref 0.50–1.10)
Glucose, Bld: 81 mg/dL (ref 70–99)
Potassium: 4.1 mEq/L (ref 3.5–5.3)
Sodium: 139 mEq/L (ref 135–145)
Total Bilirubin: 0.5 mg/dL (ref 0.2–1.2)
Total Protein: 6.8 g/dL (ref 6.0–8.3)

## 2013-11-16 LAB — LIPID PANEL
Cholesterol: 237 mg/dL — ABNORMAL HIGH (ref 0–200)
HDL: 68 mg/dL (ref >39)
LDL Cholesterol: 152 mg/dL — ABNORMAL HIGH (ref 0–99)
Total CHOL/HDL Ratio: 3.5 Ratio
Triglycerides: 85 mg/dL (ref <150)
VLDL: 17 mg/dL (ref 0–40)

## 2013-11-16 LAB — POCT SEDIMENTATION RATE: POCT SED RATE: 29 mm/hr — AB (ref 0–22)

## 2013-11-16 LAB — TSH: TSH: 255.666 u[IU]/mL — ABNORMAL HIGH (ref 0.350–4.500)

## 2013-11-16 MED ORDER — LAMOTRIGINE 100 MG PO TABS: 100.0000 mg | ORAL_TABLET | Freq: Every day | ORAL | Status: DC

## 2013-11-16 MED ORDER — ATORVASTATIN CALCIUM 40 MG PO TABS: 40.0000 mg | ORAL_TABLET | Freq: Every day | ORAL | Status: DC

## 2013-11-16 MED ORDER — CEFUROXIME AXETIL 250 MG PO TABS: 250.0000 mg | ORAL_TABLET | Freq: Two times a day (BID) | ORAL | Status: DC

## 2013-11-16 MED ORDER — TIZANIDINE HCL 4 MG PO CAPS: 4.0000 mg | ORAL_CAPSULE | Freq: Every evening | ORAL | Status: DC | PRN

## 2013-11-16 MED ORDER — CLOPIDOGREL BISULFATE 75 MG PO TABS: 75.0000 mg | ORAL_TABLET | Freq: Every day | ORAL | Status: DC

## 2013-11-16 MED ORDER — DULOXETINE HCL 30 MG PO CPEP: 60.0000 mg | ORAL_CAPSULE | Freq: Two times a day (BID) | ORAL | Status: DC

## 2013-11-16 MED ORDER — PANTOPRAZOLE SODIUM 40 MG PO TBEC: 40.0000 mg | DELAYED_RELEASE_TABLET | Freq: Every day | ORAL | Status: DC

## 2013-11-16 MED ORDER — LEVOTHYROXINE SODIUM 125 MCG PO TABS: 125.0000 ug | ORAL_TABLET | Freq: Every day | ORAL | Status: DC

## 2013-11-16 NOTE — Progress Notes (Signed)
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  Other problems include chronic otitis, depression, GERD, h/o hyperthyroidism with Radioactive I131 ablation.  She has two sons.  Objective:   Alert, NAD HEENT:  Retracted TM's bilat

## 2013-11-16 NOTE — Progress Notes (Signed)
Is a 58 year old woman who has been homeless and comes in for getting reestablished. She's had a stroke and heart attack. Her heart attack was followed by CABG and then sternal cellulitis. She has left hemiparesis. She is an unstable gait.  Patient has had hyperthyroidism treated with radioactive iodine and now she is hypothyroid being replaced.  Patient has been complaining of left ear pain for several days. Has long history of chronic otitis which has been treated with myringotomy tubes in the past.   Objective: Patient in no acute distress but has a flat affect HEENT: Bilateral scars on TMs with retraction of the eardrums. Oropharynx clear. Pupils equal and reactive with normal extraocular movement Neck: Supple no adenopathy or thyromegaly Chest: Clear Heart: Regular with no murmur, scarred sternum Abdomen: Soft nontender Extremities: Weak on left side diffusely  Assessment: Otitis media, chronic depression, status post CABG, status post CVA on anticoagulation chronically, hypothyroidism  Plan:CVA (cerebral infarction) - Plan: POCT CBC, POCT SEDIMENTATION RATE, POCT urinalysis dipstick, Comprehensive metabolic panel, Lipid panel, TSH, clopidogrel (PLAVIX) 75 MG tablet  CAD (coronary artery disease) of artery bypass graft - Plan: POCT CBC, POCT SEDIMENTATION RATE, POCT urinalysis dipstick, Comprehensive metabolic panel, Lipid panel, TSH, clopidogrel (PLAVIX) 75 MG tablet, atorvastatin (LIPITOR) 40 MG tablet  Hypothyroidism - Plan: TSH, levothyroxine (SYNTHROID, LEVOTHROID) 125 MCG tablet  Chest wall pain - Plan: tiZANidine (ZANAFLEX) 4 MG capsule  GERD (gastroesophageal reflux disease) - Plan: pantoprazole (PROTONIX) 40 MG tablet  Depression - Plan: lamoTRIgine (LAMICTAL) 100 MG tablet, DULoxetine (CYMBALTA) 30 MG capsule  Otitis media - Plan: cefUROXime (CEFTIN) 250 MG tablet  Health care maintenance - Plan: Ambulatory referral to Gastroenterology  Signed, Elvina Sidle,  MD \

## 2013-11-29 ENCOUNTER — Other Ambulatory Visit: Payer: Self-pay | Admitting: Emergency Medicine

## 2013-12-01 ENCOUNTER — Encounter: Payer: Self-pay | Admitting: Gastroenterology

## 2013-12-06 ENCOUNTER — Encounter: Payer: Self-pay | Admitting: Gastroenterology

## 2013-12-06 ENCOUNTER — Other Ambulatory Visit: Payer: Self-pay

## 2013-12-06 ENCOUNTER — Ambulatory Visit (INDEPENDENT_AMBULATORY_CARE_PROVIDER_SITE_OTHER): Payer: Medicare Other | Admitting: Gastroenterology

## 2013-12-06 VITALS — BP 144/80 | HR 72 | Ht 64.0 in | Wt 176.0 lb

## 2013-12-06 DIAGNOSIS — I2510 Atherosclerotic heart disease of native coronary artery without angina pectoris: Secondary | ICD-10-CM

## 2013-12-06 DIAGNOSIS — Z1211 Encounter for screening for malignant neoplasm of colon: Secondary | ICD-10-CM | POA: Insufficient documentation

## 2013-12-06 MED ORDER — MOVIPREP 100 G PO SOLR: 1.0000 | Freq: Once | ORAL | Status: DC

## 2013-12-06 NOTE — Progress Notes (Signed)
12/06/2013 [name] [id number] 1956-06-18   History of Present Illness:  This is a 58 year old female who presents to our office today to schedule a screening colonoscopy.  She has past medical history of CAD with stent as well as CABG (cath ok in 2012), hypertension, HLD, hypothyroid, OSA (on CPAP but currently without a well-fitting mask), and right MCA stroke in 10/2010.  She was referred here by Dr. Milus Glazier to discuss colonoscopy.  She is on Plavix.  She has never undergone colonoscopy in the past.  She has some mild constipation that is controlled with fiber and fluids in her diet.  Denies rectal bleeding, abdominal pain, or weight loss.  She just recently moved back to the [place] area from [place] and has not yet established care with any physicians, except for Dr. Milus Glazier, who has been following her and prescribing her medications.  She is on pantoprazole 40 mg once daily, which controls her GERD.  Has occasional dysphagia to pills only since her stroke.   Current Medications, Allergies, Past Medical History, Past Surgical History, Family History and Social History were reviewed in [employer] record.   Physical Exam: BP 144/80  Pulse 72  Ht 5\' 4"  (1.626 m)  Wt 176 lb (79.833 kg)  BMI 30.20 kg/m2 General: Well developed white female in no acute distress Head: Normocephalic and atraumatic Eyes:  Sclerae anicteric, conjunctiva pink  Ears: Normal auditory acuity Lungs: Clear throughout to auscultation Heart: Regular rate and rhythm Abdomen: Soft, non-distended.  Normal bowel sounds.  Non-tender. Rectal:  Deferred.  Will be done at the time of colonoscopy. Musculoskeletal: Symmetrical with no gross deformities  Extremities: No edema  Neurological: Alert oriented x 4, grossly non-focal Psychological:  Alert and cooperative. Normal mood and affect  Assessment and Recommendations: -Screening colonoscopy:  Will schedule with Dr. Russella Dar.  The risks,  benefits, and alternatives were discussed with the patient and she consents to proceed.  The risks benefits and alternatives to a temporary hold of anti-coagulants/anti-platelets for the procedure were discussed with the patient she consents to proceed. Obtain clearance from Dr. Milus Glazier. -GERD:  Well-controlled on pantoprazole 40 mg daily.  Will continue.

## 2013-12-06 NOTE — Patient Instructions (Signed)
You have been scheduled for a colonoscopy with propofol. Please follow written instructions given to you at your visit today.  Please pick up your prep kit at the pharmacy within the next 1-3 days. If you use inhalers (even only as needed), please bring them with you on the day of your procedure. Your physician has requested that you go to www.startemmi.com and enter the access code given to you at your visit today. This web site gives a general overview about your procedure. However, you should still follow specific instructions given to you by our office regarding your preparation for the procedure. You will be contacted by our office prior to your procedure for directions on holding your Plavix.  If you do not hear from our office 1 week prior to your scheduled procedure, please call [phone] to discuss.  CC:  Shanon Ace MD

## 2013-12-07 NOTE — Progress Notes (Signed)
Reviewed and agree with management plan.  Rhemi Balbach T. Aurther Harlin, MD FACG 

## 2013-12-12 ENCOUNTER — Ambulatory Visit (INDEPENDENT_AMBULATORY_CARE_PROVIDER_SITE_OTHER): Payer: Medicare Other | Admitting: Emergency Medicine

## 2013-12-12 VITALS — BP 122/72 | HR 62 | Temp 97.4°F | Resp 16 | Ht 64.0 in | Wt 181.2 lb

## 2013-12-12 DIAGNOSIS — I25810 Atherosclerosis of coronary artery bypass graft(s) without angina pectoris: Secondary | ICD-10-CM | POA: Diagnosis not present

## 2013-12-12 DIAGNOSIS — F3289 Other specified depressive episodes: Secondary | ICD-10-CM

## 2013-12-12 DIAGNOSIS — I6350 Cerebral infarction due to unspecified occlusion or stenosis of unspecified cerebral artery: Secondary | ICD-10-CM | POA: Diagnosis not present

## 2013-12-12 DIAGNOSIS — I639 Cerebral infarction, unspecified: Secondary | ICD-10-CM

## 2013-12-12 DIAGNOSIS — F32A Depression, unspecified: Secondary | ICD-10-CM

## 2013-12-12 DIAGNOSIS — F329 Major depressive disorder, single episode, unspecified: Secondary | ICD-10-CM | POA: Diagnosis not present

## 2013-12-12 MED ORDER — TRAMADOL HCL 50 MG PO TABS: 50.0000 mg | ORAL_TABLET | Freq: Two times a day (BID) | ORAL | Status: DC

## 2013-12-12 NOTE — Progress Notes (Signed)
Subjective:    Patient ID: [name], female    DOB: [birth year], 58 y.o.   MRN: [id number]  Chief Complaint  Patient presents with  . referral for dermatologist    pt needs referral for psych also needs cpap mask replacement and med refills   This chart was scribed for Arlyss Queen, MD by Zettie Pho, ED Scribe.   HPI [name] is a 58 y.o. female who presents to [facility] requesting a dermatology referral. Patient reports that she went to have laser hair removal surgery, but was told that the procedure could not be done while she is taking Plavix. Patient presents today requesting written permission to temporarily stop taking Plavix so she can have the procedure done. 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 She reports that she was was seen by Dr. Leonie Man at [facility] after the stroke.   Patient is also requesting replacement of her CPAP mask. She states she last had a sleep study 6 months ago. She reports that she has not had any problems with the mask, but that it does not fit as well as she would like. Patient reports some associated decreased sleep lately. Patient has a history of obstructive sleep apnea.   Patient is also requesting a cardiology referral. Patient reports that she last saw a cardiologist in [place], [place], and had a catheterization about 1.5 years ago that she states was normal. Patient also has a history of CAD, HTN, hyperlipidemia, diastolic dysfunction/chronic diastolic heart failure, and CABG. Patient also has a history of COPD and pulmonary nodules. She reports that she recently quit smoking.  Patient is also requesting a psychiatric referral. She states that she will often start crying "for no reason." Patient reports that she has seen Dr. Valentina Shaggy (psychologist) in the past. She also has a history of depression. Patient also has a history of homelessness, but states that she  currently has somewhere to stay.   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   Patient Active Problem List   Diagnosis Date Noted  . Special screening for malignant neoplasms, colon 12/06/2013  . Right leg injury 01/04/2012  . Hx of falling, presenting hazards to health 01/04/2012  . Homelessness 11/12/2011  . Eczema 11/12/2011  . Chronic diastolic heart failure   . Pulmonary nodules   . Acute right MCA stroke 11/07/2010  . SIALOLITHIASIS 09/22/2010  . DISORDER, ADJUSTMENT W/DEPRESSED MOOD 07/06/2007  . CHEST WALL PAIN, ANTERIOR 07/06/2007  . MIGRAINE HEADACHE 03/30/2007  . HYPOTHYROIDISM 10/28/2006  . OBSTRUCTIVE SLEEP APNEA 10/28/2006  . ASTHMA 10/28/2006   Past Medical History  Diagnosis Date  . CAD (coronary artery disease)     cath 09/2010: LAD stent patent, S-Int/dCFX ok, S-PDA ok, L-LAD atretic  . Hypertension   . Hx of CABG   . Diastolic dysfunction     TEE 11/2010: EF 60-65%, BAE, trivial atrial septal shunt;  right heart cath in 10/2010 with elevated R and L heart pressures and diuretic started  . HLD (hyperlipidemia)   . Hypothyroidism   . Sleep apnea   . COPD (chronic obstructive pulmonary disease)   . Pulmonary nodules     repeat CT due in 11/2011  . Acute right MCA stroke 11/07/10  . Hx MRSA infection  Chest wall syndrome post CABG  . Eczema   . Depression    Current Outpatient Prescriptions on File Prior to Visit  Medication Sig Dispense Refill  . ABILIFY 10 MG tablet TAKE 1 TABLET (10 MG TOTAL) BY MOUTH DAILY.  30 tablet  5  . aspirin EC 81 MG tablet Take 1 tablet (81 mg total) by mouth daily.  30 tablet  0  . atorvastatin (LIPITOR) 40 MG tablet Take 1 tablet (40 mg total) by mouth daily.  30 tablet  11  . cefUROXime (CEFTIN) 250 MG tablet Take 1 tablet (250 mg total) by mouth 2 (two) times daily with a meal.  14 tablet  0    . clopidogrel (PLAVIX) 75 MG tablet Take 1 tablet (75 mg total) by mouth daily.  30 tablet  11  . DULoxetine (CYMBALTA) 30 MG capsule Take 2 capsules (60 mg total) by mouth 2 (two) times daily.  60 capsule  11  . lamoTRIgine (LAMICTAL) 100 MG tablet Take 1 tablet (100 mg total) by mouth daily.  30 tablet  11  . levothyroxine (SYNTHROID, LEVOTHROID) 125 MCG tablet Take 1 tablet (125 mcg total) by mouth daily.  30 tablet  11  . lisinopril (PRINIVIL,ZESTRIL) 20 MG tablet TAKE 1 TABLET BY MOUTH DAILY.  30 tablet  5  . MOVIPREP 100 G SOLR Take 1 kit (200 g total) by mouth once.  1 kit  0  . pantoprazole (PROTONIX) 40 MG tablet Take 1 tablet (40 mg total) by mouth daily.  30 tablet  11  . tiZANidine (ZANAFLEX) 4 MG capsule Take 1 capsule (4 mg total) by mouth at bedtime as needed and may repeat dose one time if needed for muscle spasms. Take one tablet by mouth at bedtime PRn Spasm  30 capsule  11   No current facility-administered medications on file prior to visit.   Allergies  Allergen Reactions  . Amoxicillin Hives  . Avelox [Moxifloxacin Hcl In Nacl] Other (See Comments)    Mental breakdown.  [name] Hydrocodone Itching  . Penicillins Hives  . Sulfa Antibiotics Other (See Comments)    unknown      Review of Systems  All other systems reviewed and are negative.       Objective:   Physical Exam  CONSTITUTIONAL: Well developed/well nourished; tearful female with no acute distress HEAD: Normocephalic/atraumatic EYES: EOMI/PERRL ENMT: Mucous membranes moist NECK: supple no meningeal signs SPINE:entire spine nontender CV: S1/S2 noted, no murmurs/rubs/gallops noted; CABG scar, no palpable sternum LUNGS: Lungs are clear to auscultation bilaterally, no apparent distress ABDOMEN: soft, nontender, no rebound or guarding GU:no cva tenderness NEURO: Pt is awake/alert, moves all extremitiesx4; spastic left hemiparesis  EXTREMITIES: pulses normal, full ROM SKIN: warm, color normal PSYCH: no  abnormalities of mood noted  BP 122/72  Pulse 62  Temp(Src) 97.4 F (36.3 C) (Oral)  Resp 16  Ht _0  (1.626 m)  Wt 181 lb 3.2 oz (82.192 kg)  BMI 31.09 kg/m2  SpO2 99%       Assessment & Plan:  2:14 PM- Will refer patient to Dr. Leonie Man at [facility]. Discussed that she will need to be evaluated there to determine if it is safe for her to stop taking the Plavix. Will provide patient with a new CPAP mask. Will refer patient to cardiology for routine follow up of her significant cardiac problems. Will refer patient to Dr. Valentina Shaggy and discussed that he will be able to refer her to a psychiatrist if needed.  Will provide patient with a 30 day supply of tramadol. Discussed treatment plan with patient at bedside and patient verbalized agreement. The patient has been on Ultram for 2 years. I discussed the situation with serotonin syndrome with her. She has never had any difficulty with this and understands the risk of taking the medication but feels that she needs ultram to help her. She is comfortable taking the medication since she has been on for 2 years without difficulty. She is going to followup with Dr. Joseph Art.  I personally performed the services described in this documentation, which was scribed in my presence. The recorded information has been reviewed and is accurate.

## 2013-12-12 NOTE — Patient Instructions (Signed)
I have made referrals for you to see the neurologist, cardiologist, and the psychologist. Please be sure you followup with Dr. Milus Glazier

## 2013-12-12 NOTE — Progress Notes (Signed)
Subjective:    Patient ID: [name], female    DOB: 12-08-1955, 58 y.o.   MRN: [id number]  Chief Complaint  Patient presents with   referral for dermatologist    pt needs referral for psych also needs cpap mask replacement and med refills   This chart was scribed for Arlyss Queen, MD by Zettie Pho, ED Scribe.   HPI [name] is a 58 y.o. female who presents to [facility] requesting a dermatology referral. Patient reports that she went to have laser hair removal surgery, but was told that the procedure could not be done while she is taking Plavix. Patient presents today requesting written permission to temporarily stop taking Plavix so she can have the procedure done. 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 She reports that she was was seen by Dr. Leonie Man at [facility] after the stroke.   Patient is also requesting replacement of her CPAP mask. She states she last had a sleep study 6 months ago. She reports that she has not had any problems with the mask, but that it does not fit as well as she would like. Patient reports some associated decreased sleep lately. Patient has a history of obstructive sleep apnea.   Patient is also requesting a cardiology referral. Patient reports that she last saw a cardiologist in [place], [place], and had a catheterization about 1.5 years ago that she states was normal. Patient also has a history of CAD, HTN, hyperlipidemia, diastolic dysfunction/chronic diastolic heart failure, and CABG. Patient also has a history of COPD and pulmonary nodules. She reports that she recently quit smoking.  Patient is also requesting a psychiatric referral. She states that she will often start crying "for no reason." Patient reports that she has seen Dr. Valentina Shaggy (psychologist) in the past. She also has a history of depression. Patient also has a history of homelessness, but states that she  currently has somewhere to stay.   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   Patient Active Problem List   Diagnosis Date Noted   Special screening for malignant neoplasms, colon 12/06/2013   Right leg injury 01/04/2012   Hx of falling, presenting hazards to health 01/04/2012   Homelessness 11/12/2011   Eczema 11/12/2011   Chronic diastolic heart failure    Pulmonary nodules    Acute right MCA stroke 11/07/2010   SIALOLITHIASIS 09/22/2010   DISORDER, ADJUSTMENT W/DEPRESSED MOOD 07/06/2007   CHEST WALL PAIN, ANTERIOR 07/06/2007   MIGRAINE HEADACHE 03/30/2007   HYPOTHYROIDISM 10/28/2006   OBSTRUCTIVE SLEEP APNEA 10/28/2006   ASTHMA 10/28/2006   Past Medical History  Diagnosis Date   CAD (coronary artery disease)     cath 09/2010: LAD stent patent, S-Int/dCFX ok, S-PDA ok, L-LAD atretic   Hypertension    Hx of CABG    Diastolic dysfunction     TEE 11/2010: EF 60-65%, BAE, trivial atrial septal shunt;  right heart cath in 10/2010 with elevated R and L heart pressures and diuretic started   HLD (hyperlipidemia)    Hypothyroidism    Sleep apnea    COPD (chronic obstructive pulmonary disease)    Pulmonary nodules     repeat CT due in 11/2011   Acute right MCA stroke 11/07/10   Hx MRSA infection  Chest wall syndrome post CABG   Eczema    Depression    Current Outpatient Prescriptions on File Prior to Visit  Medication Sig Dispense Refill   ABILIFY 10 MG tablet TAKE 1 TABLET (10 MG TOTAL) BY MOUTH DAILY.  30 tablet  5   aspirin EC 81 MG tablet Take 1 tablet (81 mg total) by mouth daily.  30 tablet  0   atorvastatin (LIPITOR) 40 MG tablet Take 1 tablet (40 mg total) by mouth daily.  30 tablet  11   cefUROXime (CEFTIN) 250 MG tablet Take 1 tablet (250 mg total) by mouth 2 (two) times daily with a meal.  14 tablet  0     clopidogrel (PLAVIX) 75 MG tablet Take 1 tablet (75 mg total) by mouth daily.  30 tablet  11   DULoxetine (CYMBALTA) 30 MG capsule Take 2 capsules (60 mg total) by mouth 2 (two) times daily.  60 capsule  11   lamoTRIgine (LAMICTAL) 100 MG tablet Take 1 tablet (100 mg total) by mouth daily.  30 tablet  11   levothyroxine (SYNTHROID, LEVOTHROID) 125 MCG tablet Take 1 tablet (125 mcg total) by mouth daily.  30 tablet  11   lisinopril (PRINIVIL,ZESTRIL) 20 MG tablet TAKE 1 TABLET BY MOUTH DAILY.  30 tablet  5   MOVIPREP 100 G SOLR Take 1 kit (200 g total) by mouth once.  1 kit  0   pantoprazole (PROTONIX) 40 MG tablet Take 1 tablet (40 mg total) by mouth daily.  30 tablet  11   tiZANidine (ZANAFLEX) 4 MG capsule Take 1 capsule (4 mg total) by mouth at bedtime as needed and may repeat dose one time if needed for muscle spasms. Take one tablet by mouth at bedtime PRn Spasm  30 capsule  11   traMADol (ULTRAM) 50 MG tablet Take 50 mg by mouth 2 (two) times daily.       No current facility-administered medications on file prior to visit.   Allergies  Allergen Reactions   Amoxicillin Hives   Avelox [Moxifloxacin Hcl In Nacl] Other (See Comments)    Mental breakdown.   Hydrocodone Itching   Penicillins Hives   Sulfa Antibiotics Other (See Comments)    unknown      Review of Systems  All other systems reviewed and are negative.       Objective:   Physical Exam  CONSTITUTIONAL: Well developed/well nourished; tearful female with no acute distress HEAD: Normocephalic/atraumatic EYES: EOMI/PERRL ENMT: Mucous membranes moist NECK: supple no meningeal signs SPINE:entire spine nontender CV: S1/S2 noted, no murmurs/rubs/gallops noted; CABG scar, no palpable sternum LUNGS: Lungs are clear to auscultation bilaterally, no apparent distress ABDOMEN: soft, nontender, no rebound or guarding GU:no cva tenderness NEURO: Pt is awake/alert, moves all extremitiesx4; spastic left  hemiparesis  EXTREMITIES: pulses normal, full ROM SKIN: warm, color normal PSYCH: no abnormalities of mood noted  BP 122/72   Pulse 62   Temp(Src) 97.4 F (36.3 C) (Oral)   Resp 16   Ht '5\' 4"'  (1.626 m)   Wt 181 lb 3.2 oz (82.192 kg)   BMI 31.09 kg/m2   SpO2 99%       Assessment & Plan:  2:14 PM- Will refer patient to Dr. Leonie Man at [facility]. Discussed that she will need to be evaluated there to determine if it is safe for her to stop taking the Plavix. Will provide patient with a new CPAP mask. Will refer patient to cardiology for routine follow  up of her significant cardiac problems. Will refer patient to Dr. Valentina Shaggy and discussed that he will be able to refer her to a psychiatrist if needed. Will provide patient with a 30 day supply of tramadol. Discussed treatment plan with patient at bedside and patient verbalized agreement. The patient has been on Ultram for 2 years. I discussed the situation with serotonin syndrome with her. She has never had any difficulty with this and understands the risk of taking the medication but feels that she needs ultram to help her. She is comfortable taking the medication since she has been on for 2 years without difficulty. She is going to followup with Dr. Joseph Art.  I personally performed the services described in this documentation, which was scribed in my presence. The recorded information has been reviewed and is accurate.

## 2013-12-13 ENCOUNTER — Telehealth: Payer: Self-pay

## 2013-12-13 NOTE — Telephone Encounter (Signed)
Message copied by Donata Duff on Mon Dec 13, 2013  9:14 AM ------      Message from: Donata Duff      Created: Mon Dec 06, 2013  9:50 AM       Waiting on anti coag  ------

## 2013-12-13 NOTE — Telephone Encounter (Signed)
Anti coag letter resent 

## 2013-12-20 ENCOUNTER — Telehealth: Payer: Self-pay

## 2013-12-20 NOTE — Telephone Encounter (Signed)
Call placed to Dr Milus Glazier regarding anticoag I spoke with the nurse and she will speak with Dr Johnnette Litter today and get an answer for me

## 2013-12-20 NOTE — Telephone Encounter (Signed)
Message copied by Donata Duff on Mon Dec 20, 2013  8:32 AM ------      Message from: Jessee Avers      Created: Fri Dec 17, 2013  8:22 AM                   ----- Message -----         From: Donata Duff, CMA         Sent: 12/17/2013           To: Donata Duff, CMA                        ----- Message -----         From: Donata Duff, CMA         Sent: 12/13/2013           To: Donata Duff, CMA            Waiting on anti coag  ------

## 2013-12-22 NOTE — Telephone Encounter (Signed)
I spoke with Huntley Dec and she will make sure I get a response today

## 2013-12-24 NOTE — Telephone Encounter (Signed)
No. We need 5 day hold of Plavix. She should see her Cardiologist to evaluate and recommend. If we need to postpone the colonoscopy to allow for Cardiology evaluation that is fine.

## 2013-12-24 NOTE — Telephone Encounter (Signed)
Left message on machine to call back  

## 2013-12-24 NOTE — Telephone Encounter (Signed)
I spoke with Huntley Dec again today and she will get the letter signed and faxed to me today

## 2013-12-24 NOTE — Telephone Encounter (Signed)
Dr Russella Dar, Dr Milus Glazier only gave ok to hold plavix for  48 hours prior to 02/03/14 colon.  Is this ok?

## 2013-12-27 NOTE — Telephone Encounter (Signed)
Left message on machine to call back  

## 2013-12-28 NOTE — Telephone Encounter (Signed)
Multiple messages left with the pt to call regarding colon appt, no response appt has been cx and letter mailed to the pt to call our office

## 2013-12-29 ENCOUNTER — Telehealth: Payer: Self-pay | Admitting: Gastroenterology

## 2013-12-29 NOTE — Telephone Encounter (Signed)
I have spoken with the pt her procedure was cx until she has cardio appt

## 2013-12-29 NOTE — Telephone Encounter (Signed)
error 

## 2014-01-03 ENCOUNTER — Encounter: Payer: Self-pay | Admitting: Neurology

## 2014-01-03 ENCOUNTER — Encounter (INDEPENDENT_AMBULATORY_CARE_PROVIDER_SITE_OTHER): Payer: Self-pay

## 2014-01-03 ENCOUNTER — Ambulatory Visit (INDEPENDENT_AMBULATORY_CARE_PROVIDER_SITE_OTHER): Payer: Medicare Other | Admitting: Neurology

## 2014-01-03 VITALS — BP 142/97 | HR 66 | Ht 63.5 in | Wt 179.0 lb

## 2014-01-03 DIAGNOSIS — I2510 Atherosclerotic heart disease of native coronary artery without angina pectoris: Secondary | ICD-10-CM | POA: Diagnosis not present

## 2014-01-03 DIAGNOSIS — G44209 Tension-type headache, unspecified, not intractable: Secondary | ICD-10-CM | POA: Diagnosis not present

## 2014-01-03 DIAGNOSIS — I6789 Other cerebrovascular disease: Secondary | ICD-10-CM | POA: Diagnosis not present

## 2014-01-03 MED ORDER — TRAMADOL HCL 50 MG PO TABS: 100.0000 mg | ORAL_TABLET | Freq: Four times a day (QID) | ORAL | Status: DC | PRN

## 2014-01-03 MED ORDER — TOPIRAMATE 50 MG PO TABS: 50.0000 mg | ORAL_TABLET | Freq: Two times a day (BID) | ORAL | Status: DC

## 2014-01-03 NOTE — Patient Instructions (Addendum)
I had a long discussion with the patient regarding her headaches, discussed plan for evaluation, treatment and answered questions. Recommend increasing the tramadol to 100 mg every 6 hourly when necessary but not more than 2 days per week. Start Topamax 50 mg at night for one week increase if tolerated without side effects to 11m  twice daily.Check MRI scan of the brain with and without contrast, carotid Dopplers, transcranial Dopplers, polysomnogram and ESR.  Continue Plavix for secondary stroke prevention with strict control of hypertension with blood pressure goal below 130/90, lipids with LDL cholesterol goal below 100 mg percent and diabetes with HbA1c goal  below 6.5%. Patient has an upcoming visit with her cardiologist Dr NMeda Coffeeand plans to change Lipitor to Crestor which has worked better for her in the past. I also advised her to keep a headache diary and participate in activities for stress relaxation like regular exercise, swimming, meditation and yoga. Return for followup in 2 months, call earlier if necessary.  Tension Headache A tension headache is a feeling of pain, pressure, or aching often felt over the front and sides of the head. The pain can be dull or can feel tight (constricting). It is the most common type of headache. Tension headaches are not normally associated with nausea or vomiting and do not get worse with physical activity. Tension headaches can last 30 minutes to several days.  CAUSES  The exact cause is not known, but it may be caused by chemicals and hormones in the brain that lead to pain. Tension headaches often begin after stress, anxiety, or depression. Other triggers may include:  Alcohol.  Caffeine (too much or withdrawal).  Respiratory infections (colds, flu, sinus infections).  Dental problems or teeth clenching.  Fatigue.  Holding your head and neck in one position too long while using a computer. SYMPTOMS   Pressure around the head.   Dull, aching  head pain.   Pain felt over the front and sides of the head.   Tenderness in the muscles of the head, neck, and shoulders. DIAGNOSIS  A tension headache is often diagnosed based on:   Symptoms.   Physical examination.   A CT scan or MRI of your head. These tests may be ordered if symptoms are severe or unusual. TREATMENT  Medicines may be given to help relieve symptoms.  HOME CARE INSTRUCTIONS   Only take over-the-counter or prescription medicines for pain or discomfort as directed by your caregiver.   Lie down in a dark, quiet room when you have a headache.   Keep a journal to find out what may be triggering your headaches. For example, write down:  What you eat and drink.  How much sleep you get.  Any change to your diet or medicines.  Try massage or other relaxation techniques.   Ice packs or heat applied to the head and neck can be used. Use these 3 to 4 times per day for 15 to 20 minutes each time, or as needed.   Limit stress.   Sit up straight, and do not tense your muscles.   Quit smoking if you smoke.  Limit alcohol use.  Decrease the amount of caffeine you drink, or stop drinking caffeine.  Eat and exercise regularly.  Get 7 to 9 hours of sleep, or as recommended by your caregiver.  Avoid excessive use of pain medicine as recurrent headaches can occur.  SEEK MEDICAL CARE IF:   You have problems with the medicines you were prescribed.  Your  medicines do not work.  You have a change from the usual headache.  You have nausea or vomiting. SEEK IMMEDIATE MEDICAL CARE IF:   Your headache becomes severe.  You have a fever.  You have a stiff neck.  You have loss of vision.  You have muscular weakness or loss of muscle control.  You lose your balance or have trouble walking.  You feel faint or pass out.  You have severe symptoms that are different from your first symptoms. MAKE SURE YOU:   Understand these instructions.  Will  watch your condition.  Will get help right away if you are not doing well or get worse. Document Released: 08/26/2005 Document Revised: 11/18/2011 Document Reviewed: 08/16/2011 [facility] Patient Information 2014 [place], [place].  Stroke Prevention Some medical conditions and behaviors are associated with an increased chance of having a stroke. You may prevent a stroke by making healthy choices and managing medical conditions. HOW CAN I REDUCE MY RISK OF HAVING A STROKE?   Stay physically active. Get at least 30 minutes of activity on most or all days.  Do not smoke. It may also be helpful to avoid exposure to secondhand smoke.  Limit alcohol use. Moderate alcohol use is considered to be:  No more than 2 drinks per day for men.  No more than 1 drink per day for nonpregnant women.  Eat healthy foods. This involves  Eating 5 or more servings of fruits and vegetables a day.  Following a diet that addresses high blood pressure (hypertension), high cholesterol, diabetes, or obesity.  Manage your cholesterol levels.  A diet low in saturated fat, trans fat, and cholesterol and high in fiber may control cholesterol levels.  Take any prescribed medicines to control cholesterol as directed by your health care provider.  Manage your diabetes.  A controlled-carbohydrate, controlled-sugar diet is recommended to manage diabetes.  Take any prescribed medicines to control diabetes as directed by your health care provider.  Control your hypertension.  A low-salt (sodium), low-saturated fat, low-trans fat, and low-cholesterol diet is recommended to manage hypertension.  Take any prescribed medicines to control hypertension as directed by your health care provider.  Maintain a healthy weight.  A reduced-calorie, low-sodium, low-saturated fat, low-trans fat, low-cholesterol diet is recommended to manage weight.  Stop drug abuse.  Avoid taking birth control pills.  Talk to your health  care provider about the risks of taking birth control pills if you are over 51 years old, smoke, get migraines, or have ever had a blood clot.  Get evaluated for sleep disorders (sleep apnea).  Talk to your health care provider about getting a sleep evaluation if you snore a lot or have excessive sleepiness.  Take medicines as directed by your health care provider.  For some people, aspirin or blood thinners (anticoagulants) are helpful in reducing the risk of forming abnormal blood clots that can lead to stroke. If you have the irregular heart rhythm of atrial fibrillation, you should be on a blood thinner unless there is a good reason you cannot take them.  Understand all your medicine instructions.  Make sure that other other conditions (such as anemia or atherosclerosis) are addressed. SEEK IMMEDIATE MEDICAL CARE IF:   You have sudden weakness or numbness of the face, arm, or leg, especially on one side of the body.  Your face or eyelid droops to one side.  You have sudden confusion.  You have trouble speaking (aphasia) or understanding.  You have sudden trouble seeing in one  or both eyes.  You have sudden trouble walking.  You have dizziness.  You have a loss of balance or coordination.  You have a sudden, severe headache with no known cause.  You have new chest pain or an irregular heartbeat. Any of these symptoms may represent a serious problem that is an emergency. Do not wait to see if the symptoms will go away. Get medical help at once. Call your local emergency services  (911 in [place] Do not drive yourself to the hospital. Document Released: 10/03/2004 Document Revised: 06/16/2013 Document Reviewed: 02/26/2013 [facility] Patient Information 2014 [place].

## 2014-01-03 NOTE — Progress Notes (Signed)
[facility] [address] [place]. [place] [address] [phone]       OFFICE CONSULT NOTE  Tina. [name] Date of Birth:  04/07/1956 Medical Record Number:  [id number]   Referring MD:  Robyn Haber  Reason for Referral:   headaches HPI: [name] is 89 year Caucasian lady who has a long-standing history of migraine headaches which were previously quite infrequent but who the last 3-4 months or so she noted increasing frequency and severity of headaches. She describes the headache as bitemporal pressure-like moderate to severe 8/10 in severity. Headache is a complaint by mild nausea but no vomiting. She does have light sensitivity and feels better if she sleeps. She takes Ultram which seems to help but going to sleep as necessary. The past she is to get relief with Tylenol No. 3 but she developed allergy to codeine with itching and had stopped it. Headache frequency is every second or third day. She does remember being on Topamax in the past for migraine prophylaxis but stopped 10 years ago. She denies any visual symptoms with the headache, accompanying focal neurological symptoms. She is unable to address this retrieval for headache. 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 She was seen by me at that time she also had a TIA in June 2012 with an MRI showing no acute findings. She was homeless and had no finances and hence she did not keep subsequent followup in our office. She in fact move to [place] is to stay with family. She wasn't Medicaid for a while and recently got Medicaid approval and has moved back to [place]. She was seen on 11/16/13 in urgent care by Dr. Kern Alberta who ordered some lab work which showed is significantly limited TSH and lipids. The patient had been off her thyroid medication for more than a month at that time. She has subsequently been started on thyroxine but has not noticed  significant improvement. She in fact was seen by a neurologist in [place] for tremor which seems to have resolved. She is on Lipitor 40 mg but states she was switched from Crestor which worked quite well for her but Medicaid would not cover crestor. Now that she has Medicare   she wants to go back on Crestor but wants to wait for this until she sees cardiologist Dr. Meda Coffee next week. She states she takes her medication regularly now and her blood pressure is well controlled though it is slightly limited in the office today and 142/97. She has not had any screening carotid Dopplers done for several years. She denies any recurrent TIA or stroke symptoms for the last 3 years since her last visit. She denies any scalp tenderness, jaw claudication, myalgias or loss of vision or blurred vision with her headaches.  ROS:   14 system review of systems is positive for headache, facial drooping, or easy bruising, confusion, decreased concentration, depression, itching, muscle cramps, joint pain, bladder urgency and frequency, daytime sleepiness, frequent waking, apnea, excessive thirst, itching, swelling, trouble swallowing and ear pain.  PMH:  Past Medical History  Diagnosis Date  . CAD (coronary artery disease)     cath 09/2010: LAD stent patent, S-Int/dCFX ok, S-PDA ok, L-LAD atretic  . Hypertension   . Hx of CABG   . Diastolic dysfunction     TEE 11/2010: EF 60-65%, BAE, trivial atrial septal shunt;  right heart cath in 10/2010 with elevated R and L  heart pressures and diuretic started  . HLD (hyperlipidemia)   . Hypothyroidism   . Sleep apnea   . COPD (chronic obstructive pulmonary disease)   . Pulmonary nodules     repeat CT due in 11/2011  . Acute right MCA stroke 11/07/10  . 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 Eczema   . Depression     Social History:  History   Social History  . Marital Status: Divorced    Spouse Name: N/A    Number of Children: 2  . Years of  Education: N/A   Occupational History  . DISABLE     Social History Main Topics  . Smoking status: Current Every Day Smoker -- 0.25 packs/day for 30 years  . Smokeless tobacco: Never Used  . Alcohol Use: No  . Drug Use: No  . Sexual Activity: Not on file   Other Topics Concern  . Not on file   Social History Narrative   Divorced   After stroke was living with sister and mother after sister asked her to leave.   Difficulties over control.    Then moved back in for about 6 weeks.   . tranportaion difficult son  helping   Ex-smoker   Was living at friends  Sister and her had argument and she was told to leave .       She is now living in a homeless shelter for the last 3 weeks [facility]    son had help with transportation and medication       Work status regular  before got sick on short-term disability now lost t insurance after the stroke and couldn't work was  denied ssi    2 times.  She is not eligible for Medicaid because she doesn't have dependent children.         College graduate ;psychology [employer] graduated May 2011   Has children    Medications:   Current Outpatient Prescriptions on File Prior to Visit  Medication Sig Dispense Refill  . ABILIFY 10 MG tablet TAKE 1 TABLET (10 MG TOTAL) BY MOUTH DAILY.  30 tablet  5  . aspirin EC 81 MG tablet Take 1 tablet (81 mg total) by mouth daily.  30 tablet  0  . atorvastatin (LIPITOR) 40 MG tablet Take 1 tablet (40 mg total) by mouth daily.  30 tablet  11  . clopidogrel (PLAVIX) 75 MG tablet Take 1 tablet (75 mg total) by mouth daily.  30 tablet  11  . DULoxetine (CYMBALTA) 30 MG capsule Take 2 capsules (60 mg total) by mouth 2 (two) times daily.  60 capsule  11  . lamoTRIgine (LAMICTAL) 100 MG tablet Take 1 tablet (100 mg total) by mouth daily.  30 tablet  11  . levothyroxine (SYNTHROID, LEVOTHROID) 125 MCG tablet Take 1 tablet (125 mcg total) by mouth daily.  30 tablet  11  . lisinopril (PRINIVIL,ZESTRIL) 20 MG tablet  TAKE 1 TABLET BY MOUTH DAILY.  30 tablet  5  . pantoprazole (PROTONIX) 40 MG tablet Take 1 tablet (40 mg total) by mouth daily.  30 tablet  11  . tiZANidine (ZANAFLEX) 4 MG capsule Take 1 capsule (4 mg total) by mouth at bedtime as needed and may repeat dose one time if needed for muscle spasms. Take one tablet by mouth at bedtime PRn Spasm  30 capsule  11   No current facility-administered medications on file prior to visit.  Allergies:   Allergies  Allergen Reactions  . Amoxicillin Hives  . Avelox [Moxifloxacin Hcl In Nacl] Other (See Comments)    Mental breakdown.  [name] Hydrocodone Itching  . Penicillins Hives  . Sulfa Antibiotics Other (See Comments)    unknown    Physical Exam General: well developed, well nourished, seated, in no evident distress Head: head normocephalic and atraumatic. Orohparynx benign Neck: supple with no carotid or supraclavicular bruits Cardiovascular: regular rate and rhythm, no murmurs Musculoskeletal: no deformity Skin:  no rash/petichiae Vascular:  Normal pulses all extremities Filed Vitals:   01/03/14 1406  BP: 142/97  Pulse: 66    Neurologic Exam Mental Status: Awake and fully alert. Oriented to place and time. Recent and remote memory intact. Attention span, concentration and fund of knowledge appropriate. Mood and affect appropriate.  Cranial Nerves: Fundoscopic exam reveals sharp disc margins. Pupils equal, briskly reactive to light. Extraocular movements full without nystagmus. Visual fields full to confrontation. Hearing intact. Facial sensation intact. Mild left lower facial asymmetry.Tongue, palate moves normally and symmetrically.  Motor: Normal bulk and tone. Normal strength in all tested extremity muscles. Diminished fine finger movements on the left. Orbits right over left upper extremity. Minimum weakness of the left grip. Sensory.: intact to tough and pinprick and vibratory.  Coordination: Rapid alternating movements normal in all  extremities. Finger-to-nose and heel-to-shin performed accurately bilaterally. Gait and Station: Arises from chair without difficulty. Stance is normal. Gait demonstrates normal stride length and balance . Able to heel, toe and tandem walk without difficulty.  Reflexes: 1+ and symmetric. Toes downgoing.   NIHSS  1 Modified Rankin  1   ASSESSMENT: 89 year lady with long-standing history of migraine headaches with now mixed tension and migraine headaches which have increased in frequency and severity over the last 3-4 months. Remote history of right MCA infarct in February 2012 and TIA in June 2012 with mild residual left sided weakness. Multiple risk factors of hypertension, hyperlipidemia, coronary artery disease and suspected sleep apnoea    PLAN: I had a long discussion with the patient regarding her headaches, discussed plan for evaluation, treatment and answered questions. Recommend increasing the tramadol to 100 mg every 6 hourly when necessary but not more than 2 days per week. Start Topamax 50 mg at night for one week increase if tolerated without side effects to 4m  twice daily. Continue Plavix for secondary stroke prevention with strict control of hypertension with blood pressure goal below 130/90, lipids with LDL cholesterol goal below 100 mg percent and diabetes with HbA1c goal  below 6.5%. Patient has an upcoming visit with her cardiologist Dr NMeda Coffeeand plans to change Lipitor to Crestor which has worked better for her in the past. I also advised her to keep a headache diary and participate in activities for stress relaxation like regular exercise, swimming, meditation and yoga. Check MRI scan of the brain with and without contrast, carotid Dopplers, transcranial Dopplers, polysomnogram and ESR. Return for followup in 2 months, call earlier if necessary.  Note: This document was prepared with digital dictation and possible smart phrase technology. Any transcriptional errors that result  from this process are unintentional.

## 2014-01-04 ENCOUNTER — Institutional Professional Consult (permissible substitution): Payer: Medicare Other | Admitting: Cardiology

## 2014-01-04 LAB — SEDIMENTATION RATE: Sed Rate: 17 mm/hr (ref 0–40)

## 2014-01-08 ENCOUNTER — Ambulatory Visit (INDEPENDENT_AMBULATORY_CARE_PROVIDER_SITE_OTHER): Payer: Medicare Other | Admitting: Family Medicine

## 2014-01-08 VITALS — BP 128/88 | HR 66 | Temp 97.8°F | Resp 16 | Ht 64.0 in | Wt 180.0 lb

## 2014-01-08 DIAGNOSIS — R400 Somnolence: Secondary | ICD-10-CM

## 2014-01-08 DIAGNOSIS — R404 Transient alteration of awareness: Secondary | ICD-10-CM

## 2014-01-08 DIAGNOSIS — R51 Headache: Secondary | ICD-10-CM | POA: Diagnosis not present

## 2014-01-08 LAB — COMPREHENSIVE METABOLIC PANEL
ALT: 12 U/L (ref 0–35)
AST: 15 U/L (ref 0–37)
Albumin: 4.2 g/dL (ref 3.5–5.2)
Alkaline Phosphatase: 72 U/L (ref 39–117)
BUN: 11 mg/dL (ref 6–23)
CO2: 25 mEq/L (ref 19–32)
Calcium: 9.1 mg/dL (ref 8.4–10.5)
Chloride: 108 mEq/L (ref 96–112)
Creat: 1.12 mg/dL — ABNORMAL HIGH (ref 0.50–1.10)
Glucose, Bld: 89 mg/dL (ref 70–99)
Potassium: 4.1 mEq/L (ref 3.5–5.3)
Sodium: 141 mEq/L (ref 135–145)
Total Bilirubin: 0.6 mg/dL (ref 0.2–1.2)
Total Protein: 6.7 g/dL (ref 6.0–8.3)

## 2014-01-08 LAB — POCT CBC
Granulocyte percent: 73.6 %G (ref 37–80)
HCT, POC: 38.9 % (ref 37.7–47.9)
Hemoglobin: 12.7 g/dL (ref 12.2–16.2)
Lymph, poc: 1.3 (ref 0.6–3.4)
MCH, POC: 32.2 pg — AB (ref 27–31.2)
MCHC: 32.6 g/dL (ref 31.8–35.4)
MCV: 98.5 fL — AB (ref 80–97)
MID (cbc): 0.4 (ref 0–0.9)
MPV: 9.6 fL (ref 0–99.8)
POC Granulocyte: 4.7 (ref 2–6.9)
POC LYMPH PERCENT: 20 %L (ref 10–50)
POC MID %: 6.4 %M (ref 0–12)
Platelet Count, POC: 227 10*3/uL (ref 142–424)
RBC: 3.95 M/uL — AB (ref 4.04–5.48)
RDW, POC: 13.5 %
WBC: 6.4 10*3/uL (ref 4.6–10.2)

## 2014-01-08 LAB — TSH: TSH: 4.065 u[IU]/mL (ref 0.350–4.500)

## 2014-01-08 LAB — T4, FREE: Free T4: 1.55 ng/dL (ref 0.80–1.80)

## 2014-01-08 NOTE — Progress Notes (Signed)
58 y.o. Female presents to clinic to follow up on lab work. Reports seeing neuro recently and being started on Topamax at bedtime. Reports having trouble with fatigue and residual left side weakness from stroke. Per pt neuro wanted her to have thyroid lever checked. Pt reports feeling stable on her feet. States that she use to use a cane which she no longer uses x 2 weeks. Reports having taken Provigil in the past and would like to start that again.   Quit smoking 4 weeks ago.  Neuro: Dr. Pearlean Brownie    Objective:  Results for orders placed in visit on 01/03/14  SEDIMENTATION RATE      Result Value Ref Range   Sed Rate 17  0 - 40 mm/hr   Patient is very sluggish and has very poor eye contact. He is slow to respond, moves with shuffling gait.  Cranial nerves III through XII are intact Oropharynx: No acute lesions Neck: Supple no adenopathy or thyromegaly Chest: Clear to auscultation although I do hear some audible expiratory wheezes at times mom just listen to the patient pre- Heart: Regular no murmur Abdomen: Soft nontender no HSM or masses extremities: No edema Skin: 3 separate cysts on scalp  Assessment: I do not feel comfortable ordering Provigil for patient. I do think she needs close followup in the neurology folks to consider this.  Plan: Check thyroid function today along with cemented and CBC Have patient see neurology urgently to reevaluate for somnolence Referral to our physician assistant, cerebral for cyst removal of scalp.  Signed, Sheila Oats.D.

## 2014-01-12 ENCOUNTER — Telehealth: Payer: Self-pay | Admitting: Family Medicine

## 2014-01-12 NOTE — Progress Notes (Signed)
Pt will call back to make an appt when she has her calender for dates

## 2014-01-12 NOTE — Telephone Encounter (Signed)
Message copied by Tilman Neat on Wed Jan 12, 2014  1:30 PM ------      Message from: Elvina Sidle      Created: Sat Jan 08, 2014 11:02 AM       appt for scalp cyst removal with Maralyn Sago needs 45 minutes ------

## 2014-01-12 NOTE — Telephone Encounter (Signed)
Pt will return call when she has access to her calender

## 2014-01-13 ENCOUNTER — Encounter: Payer: Medicare Other | Admitting: Family Medicine

## 2014-01-14 ENCOUNTER — Ambulatory Visit (INDEPENDENT_AMBULATORY_CARE_PROVIDER_SITE_OTHER): Payer: Medicare Other

## 2014-01-14 DIAGNOSIS — I6789 Other cerebrovascular disease: Secondary | ICD-10-CM

## 2014-01-14 DIAGNOSIS — Z0289 Encounter for other administrative examinations: Secondary | ICD-10-CM

## 2014-01-17 ENCOUNTER — Institutional Professional Consult (permissible substitution): Payer: Medicare Other | Admitting: Cardiology

## 2014-01-19 ENCOUNTER — Ambulatory Visit (INDEPENDENT_AMBULATORY_CARE_PROVIDER_SITE_OTHER): Payer: Medicare Other | Admitting: Family Medicine

## 2014-01-19 VITALS — BP 130/98 | HR 61 | Temp 98.0°F | Resp 18 | Ht 62.5 in | Wt 175.6 lb

## 2014-01-19 DIAGNOSIS — R300 Dysuria: Secondary | ICD-10-CM

## 2014-01-19 DIAGNOSIS — R350 Frequency of micturition: Secondary | ICD-10-CM

## 2014-01-19 DIAGNOSIS — N390 Urinary tract infection, site not specified: Secondary | ICD-10-CM

## 2014-01-19 LAB — POCT UA - MICROSCOPIC ONLY
Casts, Ur, LPF, POC: NEGATIVE
Crystals, Ur, HPF, POC: NEGATIVE
Mucus, UA: NEGATIVE
Yeast, UA: NEGATIVE

## 2014-01-19 LAB — POCT URINALYSIS DIPSTICK
Glucose, UA: NEGATIVE
Nitrite, UA: POSITIVE
Protein, UA: 100
Spec Grav, UA: >=1.03
Urobilinogen, UA: 1
pH, UA: 5.5

## 2014-01-19 MED ORDER — NITROFURANTOIN MONOHYD MACRO 100 MG PO CAPS: 100.0000 mg | ORAL_CAPSULE | Freq: Two times a day (BID) | ORAL | Status: DC

## 2014-01-19 MED ORDER — FLUCONAZOLE 150 MG PO TABS
150.0000 mg | ORAL_TABLET | Freq: Once | ORAL | Status: DC
Start: 2014-01-19 — End: 2014-02-16

## 2014-01-19 NOTE — Progress Notes (Signed)
   Subjective:    Patient ID: [name], female    DOB: 11-03-1955, 58 y.o.   MRN: [id number]  HPI Patient presents with UTI symptoms. Used to get UTIs frequently, but has not had one for 25 years. Currently getting burning with urination as well as hesistancy and frequency. No fever, vomiting. Having some left sided back pain for 1 week, much before urinary symptoms started. Does not feel acutely ill.   Review of Systems  All other systems reviewed and are negative.     Objective:   Physical Exam  Constitutional: She is oriented to person, place, and time. She appears well-developed and well-nourished. No distress.  HENT:  Head: Normocephalic and atraumatic.  Eyes: Conjunctivae are normal. No scleral icterus.  Cardiovascular: Normal rate, regular rhythm, normal heart sounds and intact distal pulses.   Pulmonary/Chest: Effort normal and breath sounds normal. No respiratory distress.  Abdominal: Soft. She exhibits no distension. There is no tenderness. There is no rebound and no guarding.  Musculoskeletal: Normal range of motion.  Neurological: She is alert and oriented to person, place, and time.  Skin: Skin is warm and dry. She is not diaphoretic.  Psychiatric: She has a normal mood and affect. Her behavior is normal.  She has lumbar and thoracic paraspinal TTP just with gentle palpation of muscles diffusely. Does not appear to have CVA TTP.     Assessment & Plan:  #1. UTI. Do not suspect pyelo. - send for culture - macrobid - diflucan as patient often gets UTI with abx - return precautions.

## 2014-01-19 NOTE — Patient Instructions (Signed)
Thank you for coming in today  You have a urinary tract infection We will send your urine for culture Start nitrofurantoin 2x per day for 10 days Only use diflucan if you get a yeast infection Return for any fever, vomiting, worsening illness

## 2014-01-20 ENCOUNTER — Ambulatory Visit
Admission: RE | Admit: 2014-01-20 | Discharge: 2014-01-20 | Disposition: A | Discharge status: Home / Self Care | Payer: Medicare Other | Source: Ambulatory Visit | Attending: Neurology | Admitting: Neurology

## 2014-01-20 DIAGNOSIS — I6789 Other cerebrovascular disease: Secondary | ICD-10-CM | POA: Diagnosis not present

## 2014-01-20 DIAGNOSIS — R51 Headache: Secondary | ICD-10-CM

## 2014-01-20 DIAGNOSIS — G44209 Tension-type headache, unspecified, not intractable: Secondary | ICD-10-CM | POA: Diagnosis not present

## 2014-01-20 MED ORDER — GADOBENATE DIMEGLUMINE 529 MG/ML IV SOLN
16.0000 mL | Freq: Once | INTRAVENOUS | Status: AC | PRN
  Administered 2014-01-20: 16 mL via INTRAVENOUS

## 2014-01-22 LAB — URINE CULTURE

## 2014-01-29 DIAGNOSIS — R229 Localized swelling, mass and lump, unspecified: Secondary | ICD-10-CM | POA: Diagnosis not present

## 2014-02-03 ENCOUNTER — Encounter: Payer: Medicare Other | Admitting: Gastroenterology

## 2014-02-04 ENCOUNTER — Telehealth: Payer: Self-pay | Admitting: *Deleted

## 2014-02-04 NOTE — Telephone Encounter (Signed)
I called and relayed to pt that carotid and TCD results normal.  Repeat in 12 months. Pt verbalized understanding.

## 2014-02-16 ENCOUNTER — Encounter: Payer: Self-pay | Admitting: Cardiology

## 2014-02-16 ENCOUNTER — Inpatient Hospital Stay (HOSPITAL_COMMUNITY)
Admission: AD | Admit: 2014-02-16 | Discharge: 2014-02-20 | DRG: 292 | Disposition: A | Discharge status: Home / Self Care | Payer: Medicare Other | Source: Ambulatory Visit | Attending: Cardiology | Admitting: Cardiology

## 2014-02-16 ENCOUNTER — Ambulatory Visit (INDEPENDENT_AMBULATORY_CARE_PROVIDER_SITE_OTHER): Payer: Medicare Other | Admitting: Cardiology

## 2014-02-16 ENCOUNTER — Encounter: Payer: Self-pay | Admitting: Physician Assistant

## 2014-02-16 VITALS — BP 122/80 | HR 116 | Ht 62.5 in | Wt 172.0 lb

## 2014-02-16 DIAGNOSIS — I5041 Acute combined systolic (congestive) and diastolic (congestive) heart failure: Secondary | ICD-10-CM | POA: Insufficient documentation

## 2014-02-16 DIAGNOSIS — F3289 Other specified depressive episodes: Secondary | ICD-10-CM | POA: Diagnosis present

## 2014-02-16 DIAGNOSIS — F329 Major depressive disorder, single episode, unspecified: Secondary | ICD-10-CM | POA: Diagnosis present

## 2014-02-16 DIAGNOSIS — I2510 Atherosclerotic heart disease of native coronary artery without angina pectoris: Secondary | ICD-10-CM | POA: Insufficient documentation

## 2014-02-16 DIAGNOSIS — R071 Chest pain on breathing: Secondary | ICD-10-CM

## 2014-02-16 DIAGNOSIS — I10 Essential (primary) hypertension: Secondary | ICD-10-CM | POA: Diagnosis present

## 2014-02-16 DIAGNOSIS — Z5900 Homelessness unspecified: Secondary | ICD-10-CM | POA: Diagnosis not present

## 2014-02-16 DIAGNOSIS — Z951 Presence of aortocoronary bypass graft: Secondary | ICD-10-CM | POA: Diagnosis not present

## 2014-02-16 DIAGNOSIS — E876 Hypokalemia: Secondary | ICD-10-CM | POA: Diagnosis not present

## 2014-02-16 DIAGNOSIS — Z9861 Coronary angioplasty status: Secondary | ICD-10-CM

## 2014-02-16 DIAGNOSIS — I509 Heart failure, unspecified: Secondary | ICD-10-CM | POA: Diagnosis not present

## 2014-02-16 DIAGNOSIS — J449 Chronic obstructive pulmonary disease, unspecified: Secondary | ICD-10-CM | POA: Diagnosis not present

## 2014-02-16 DIAGNOSIS — J4489 Other specified chronic obstructive pulmonary disease: Secondary | ICD-10-CM | POA: Diagnosis present

## 2014-02-16 DIAGNOSIS — I5031 Acute diastolic (congestive) heart failure: Secondary | ICD-10-CM

## 2014-02-16 DIAGNOSIS — Z79899 Other long term (current) drug therapy: Secondary | ICD-10-CM

## 2014-02-16 DIAGNOSIS — F4321 Adjustment disorder with depressed mood: Secondary | ICD-10-CM | POA: Diagnosis not present

## 2014-02-16 DIAGNOSIS — E039 Hypothyroidism, unspecified: Secondary | ICD-10-CM | POA: Diagnosis present

## 2014-02-16 DIAGNOSIS — Z8673 Personal history of transient ischemic attack (TIA), and cerebral infarction without residual deficits: Secondary | ICD-10-CM

## 2014-02-16 DIAGNOSIS — Z7982 Long term (current) use of aspirin: Secondary | ICD-10-CM

## 2014-02-16 DIAGNOSIS — E785 Hyperlipidemia, unspecified: Secondary | ICD-10-CM | POA: Diagnosis present

## 2014-02-16 DIAGNOSIS — G4733 Obstructive sleep apnea (adult) (pediatric): Secondary | ICD-10-CM | POA: Diagnosis present

## 2014-02-16 DIAGNOSIS — I4891 Unspecified atrial fibrillation: Secondary | ICD-10-CM | POA: Diagnosis present

## 2014-02-16 DIAGNOSIS — R0602 Shortness of breath: Secondary | ICD-10-CM | POA: Diagnosis not present

## 2014-02-16 DIAGNOSIS — Z8614 Personal history of Methicillin resistant Staphylococcus aureus infection: Secondary | ICD-10-CM

## 2014-02-16 DIAGNOSIS — I25810 Atherosclerosis of coronary artery bypass graft(s) without angina pectoris: Secondary | ICD-10-CM

## 2014-02-16 DIAGNOSIS — I5043 Acute on chronic combined systolic (congestive) and diastolic (congestive) heart failure: Secondary | ICD-10-CM | POA: Diagnosis present

## 2014-02-16 DIAGNOSIS — I5032 Chronic diastolic (congestive) heart failure: Secondary | ICD-10-CM | POA: Diagnosis not present

## 2014-02-16 DIAGNOSIS — Z87891 Personal history of nicotine dependence: Secondary | ICD-10-CM

## 2014-02-16 DIAGNOSIS — Z8249 Family history of ischemic heart disease and other diseases of the circulatory system: Secondary | ICD-10-CM | POA: Diagnosis not present

## 2014-02-16 DIAGNOSIS — I428 Other cardiomyopathies: Secondary | ICD-10-CM | POA: Diagnosis present

## 2014-02-16 DIAGNOSIS — I429 Cardiomyopathy, unspecified: Secondary | ICD-10-CM | POA: Diagnosis not present

## 2014-02-16 DIAGNOSIS — I63511 Cerebral infarction due to unspecified occlusion or stenosis of right middle cerebral artery: Secondary | ICD-10-CM

## 2014-02-16 HISTORY — DX: Unspecified atrial fibrillation: I48.91

## 2014-02-16 HISTORY — DX: Personal history of other medical treatment: Z92.89

## 2014-02-16 HISTORY — DX: Cardiomyopathy, unspecified: I42.9

## 2014-02-16 LAB — CBC
HCT: 34.5 % — ABNORMAL LOW (ref 36.0–46.0)
Hemoglobin: 12 g/dL (ref 12.0–15.0)
MCH: 31.7 pg (ref 26.0–34.0)
MCHC: 34.8 g/dL (ref 30.0–36.0)
MCV: 91 fL (ref 78.0–100.0)
Platelets: 186 10*3/uL (ref 150–400)
RBC: 3.79 MIL/uL — ABNORMAL LOW (ref 3.87–5.11)
RDW: 12.7 % (ref 11.5–15.5)
WBC: 5.5 10*3/uL (ref 4.0–10.5)

## 2014-02-16 LAB — PROTIME-INR
INR: 1.2 (ref 0.00–1.49)
Prothrombin Time: 14.9 seconds (ref 11.6–15.2)

## 2014-02-16 LAB — MRSA PCR SCREENING: MRSA by PCR: NEGATIVE

## 2014-02-16 MED ORDER — SODIUM CHLORIDE 0.9 % IJ SOLN
3.0000 mL | Freq: Two times a day (BID) | INTRAMUSCULAR | Status: DC
  Administered 2014-02-17 – 2014-02-19 (×6): 3 mL via INTRAVENOUS

## 2014-02-16 MED ORDER — SODIUM CHLORIDE 0.9 % IV SOLN: INTRAVENOUS | Status: DC

## 2014-02-16 MED ORDER — LEVOTHYROXINE SODIUM 125 MCG PO TABS
125.0000 ug | ORAL_TABLET | Freq: Every day | ORAL | Status: DC
  Administered 2014-02-17 – 2014-02-20 (×4): 125 ug via ORAL
  Filled 2014-02-16 (×5): qty 1

## 2014-02-16 MED ORDER — ATORVASTATIN CALCIUM 40 MG PO TABS
40.0000 mg | ORAL_TABLET | Freq: Every day | ORAL | Status: DC
  Administered 2014-02-16 – 2014-02-17 (×2): 40 mg via ORAL
  Filled 2014-02-16 (×3): qty 1

## 2014-02-16 MED ORDER — SODIUM CHLORIDE 0.9 % IJ SOLN
3.0000 mL | Freq: Two times a day (BID) | INTRAMUSCULAR | Status: DC
  Administered 2014-02-17 – 2014-02-19 (×3): 3 mL via INTRAVENOUS

## 2014-02-16 MED ORDER — HEPARIN (PORCINE) IN NACL 100-0.45 UNIT/ML-% IJ SOLN
1200.0000 [IU]/h | INTRAMUSCULAR | Status: AC
  Administered 2014-02-16 – 2014-02-17 (×2): 1000 [IU]/h via INTRAVENOUS
  Administered 2014-02-18: 1100 [IU]/h via INTRAVENOUS
  Filled 2014-02-16 (×5): qty 250

## 2014-02-16 MED ORDER — METOPROLOL TARTRATE 25 MG PO TABS
25.0000 mg | ORAL_TABLET | Freq: Two times a day (BID) | ORAL | Status: DC
  Administered 2014-02-16 – 2014-02-17 (×2): 25 mg via ORAL
  Filled 2014-02-16 (×3): qty 1

## 2014-02-16 MED ORDER — METOPROLOL TARTRATE 1 MG/ML IV SOLN
5.0000 mg | Freq: Once | INTRAVENOUS | Status: AC
  Administered 2014-02-16: 5 mg via INTRAVENOUS

## 2014-02-16 MED ORDER — DULOXETINE HCL 60 MG PO CPEP
60.0000 mg | ORAL_CAPSULE | Freq: Two times a day (BID) | ORAL | Status: DC
  Administered 2014-02-16 – 2014-02-20 (×8): 60 mg via ORAL
  Filled 2014-02-16 (×9): qty 1

## 2014-02-16 MED ORDER — SODIUM CHLORIDE 0.9 % IV SOLN: 250.0000 mL | INTRAVENOUS | Status: DC

## 2014-02-16 MED ORDER — ARIPIPRAZOLE 10 MG PO TABS
10.0000 mg | ORAL_TABLET | Freq: Every day | ORAL | Status: DC
  Administered 2014-02-16 – 2014-02-20 (×5): 10 mg via ORAL
  Filled 2014-02-16 (×5): qty 1

## 2014-02-16 MED ORDER — ASPIRIN EC 81 MG PO TBEC
81.0000 mg | DELAYED_RELEASE_TABLET | Freq: Every day | ORAL | Status: DC
  Administered 2014-02-17 – 2014-02-20 (×4): 81 mg via ORAL
  Filled 2014-02-16 (×4): qty 1

## 2014-02-16 MED ORDER — LEVOTHYROXINE SODIUM 125 MCG PO TABS: 125.0000 ug | ORAL_TABLET | Freq: Every day | ORAL | Status: DC

## 2014-02-16 MED ORDER — LISINOPRIL 20 MG PO TABS
20.0000 mg | ORAL_TABLET | Freq: Every day | ORAL | Status: DC
  Administered 2014-02-17 – 2014-02-20 (×4): 20 mg via ORAL
  Filled 2014-02-16 (×4): qty 1

## 2014-02-16 MED ORDER — ACETAMINOPHEN 325 MG PO TABS
650.0000 mg | ORAL_TABLET | Freq: Four times a day (QID) | ORAL | Status: DC | PRN
  Administered 2014-02-16: 650 mg via ORAL
  Filled 2014-02-16: qty 2

## 2014-02-16 MED ORDER — HEPARIN BOLUS VIA INFUSION
4000.0000 [IU] | Freq: Once | INTRAVENOUS | Status: AC
  Administered 2014-02-16: 4000 [IU] via INTRAVENOUS
  Filled 2014-02-16: qty 4000

## 2014-02-16 MED ORDER — METOPROLOL TARTRATE 1 MG/ML IV SOLN
INTRAVENOUS | Status: AC
  Filled 2014-02-16: qty 5

## 2014-02-16 MED ORDER — SODIUM CHLORIDE 0.9 % IJ SOLN: 3.0000 mL | INTRAMUSCULAR | Status: DC | PRN

## 2014-02-16 MED ORDER — LAMOTRIGINE 100 MG PO TABS
100.0000 mg | ORAL_TABLET | Freq: Every day | ORAL | Status: DC
  Administered 2014-02-16 – 2014-02-20 (×5): 100 mg via ORAL
  Filled 2014-02-16 (×5): qty 1

## 2014-02-16 NOTE — Progress Notes (Signed)
Admitted from the MD's office by ambulance due to sob and A-fib RVR. Denied any discomfort.`

## 2014-02-16 NOTE — Progress Notes (Signed)
ANTICOAGULATION CONSULT NOTE - Initial Consult  Pharmacy Consult for heparin Indication: atrial fibrillation (new diagnosis)  Allergies  Allergen Reactions  . Amoxicillin Hives  . Avelox [Moxifloxacin Hcl In Nacl] Other (See Comments)    Mental breakdown.  [name] Hydrocodone Itching  . Penicillins Hives  . Sulfa Antibiotics Other (See Comments)    unknown    Patient Measurements: Height: 5\' 3"  (160 cm) Weight: 177 lb 7.5 oz (80.5 kg) IBW/kg (Calculated) : 52.4 Heparin Dosing Weight: 70 kg  Vital Signs: Temp: 98.7 F (37.1 C) (06/10 1645) Temp src: Oral (06/10 1645) BP: 145/99 mmHg (06/10 1730) Pulse Rate: 174 (06/10 1730)  Labs: No results found for this basename: HGB, HCT, PLT, APTT, LABPROT, INR, HEPARINUNFRC, CREATININE, CKTOTAL, CKMB, TROPONINI,  in the last 72 hours  Estimated Creatinine Clearance: 56.3 ml/min (by C-G formula based on Cr of 1.12).   Medical History: Past Medical History  Diagnosis Date  . CAD (coronary artery disease)     cath 09/2010: LAD stent patent, S-Int/dCFX ok, S-PDA ok, L-LAD atretic  . Hypertension   . Hx of CABG   . Diastolic dysfunction     TEE 11/2010: EF 60-65%, BAE, trivial atrial septal shunt;  right heart cath in 10/2010 with elevated R and L heart pressures and diuretic started  . HLD (hyperlipidemia)   . Hypothyroidism   . Sleep apnea   . COPD (chronic obstructive pulmonary disease)   . Pulmonary nodules     repeat CT due in 11/2011  . Acute right MCA stroke 11/07/10  . 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 Eczema   . Depression     Medications:  Scheduled:  . ARIPiprazole  10 mg Oral Daily  . aspirin EC  81 mg Oral Daily  . atorvastatin  40 mg Oral Daily  . DULoxetine  60 mg Oral BID  . lamoTRIgine  100 mg Oral Daily  . levothyroxine  125 mcg Oral Daily  . lisinopril  20 mg Oral Daily  . sodium chloride  3 mL Intravenous Q12H   Infusions:    Assessment: 58 yo female with afib will be started on  heparin therapy.  No baseline CBC yet. Patient is not on any anticoagulants prior to admission.  Patient has a hx of CAD, HTN, CABG, HLD, and diastolic dysfunction. Will potentially have TEE with cardioversion tom per MD's note.  Goal of Therapy:  Heparin level 0.3-0.7 units/ml Monitor platelets by anticoagulation protocol: Yes   Plan:  1) Heparin 4000 units iv bolus x1, then start heparin drip at 1000 units/hr 2) Check a 6hr heparin level after drip is started 3) Daily heparin level and CBC  [name], Tsz-Yin 02/16/2014,5:46 PM

## 2014-02-16 NOTE — Progress Notes (Signed)
Patient ID: [name], female   DOB: 03/05/1956, 58 y.o.   MRN: [id number]     Patient Name: [name] Date of Encounter: 02/16/2014  Primary Care Provider:  [name], MD Primary Cardiologist:  Lars Masson  Problem List   Past Medical History  Diagnosis Date  . CAD (coronary artery disease)     cath 09/2010: LAD stent patent, S-Int/dCFX ok, S-PDA ok, L-LAD atretic  . Hypertension   . Hx of CABG   . Diastolic dysfunction     TEE 11/2010: EF 60-65%, BAE, trivial atrial septal shunt;  right heart cath in 10/2010 with elevated R and L heart pressures and diuretic started  . HLD (hyperlipidemia)   . Hypothyroidism   . Sleep apnea   . COPD (chronic obstructive pulmonary disease)   . Pulmonary nodules     repeat CT due in 11/2011  . Acute right MCA stroke 11/07/10  . 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 Eczema   . Depression    Past Surgical History  Procedure Laterality Date  . Coronary artery bypass graft    . Debriment for infection in chest    . Chest wall reconstruction    . Bilateral knee surgery    . Bladder surgery    . Left mastoidectomy    . Hernia repair    . Cath 2012      Allergies  Allergies  Allergen Reactions  . Amoxicillin Hives  . Avelox [Moxifloxacin Hcl In Nacl] Other (See Comments)    Mental breakdown.  [name] Hydrocodone Itching  . Penicillins Hives  . Sulfa Antibiotics Other (See Comments)    unknown    HPI  58 year old female with prior medical history of coronary artery disease, tension, hyperlipidemia, stroke who is coming for concern of progressively worsening shortness of breath and orthopnea. The patient has a prior medical history of coronary artery bypass disease in 2008 and she was followed in our clinic by Dr. Riley Kill until approximately 20 years ago. She afterwards had a stroke became homeless and moved to [place].  She not get back to [facility] about 6 months ago and was seen in urgent care  for a spider bite which she was told by a physician that her heartbeat are irregular. The patient mentions that over the last months she has noted progressively worsening shortness of breath, orthopnea and paroxysmal nocturnal dyspnea in the last week and inability to sleep. She is currently short of breath at rest and gasping for breath she has noticed that her heart rate has been elevated and on exertion could be as high as 180 beats per minute. She attributed this to smoking, so she quit just 3 days ago and is currently using nicotine patches. She denies any chest pain, syncope.  Home Medications  Prior to Admission medications   Medication Sig Start Date End Date Taking? Authorizing Provider  ABILIFY 10 MG tablet TAKE 1 TABLET (10 MG TOTAL) BY MOUTH DAILY. 11/29/13  Yes Elvina Sidle, MD  aspirin EC 81 MG tablet Take 1 tablet (81 mg total) by mouth daily. 10/28/13  Yes Phillips Odor, MD  atorvastatin (LIPITOR) 40 MG tablet Take 1 tablet (40 mg total) by mouth daily. 11/16/13  Yes Elvina Sidle, MD  clopidogrel (PLAVIX) 75 MG tablet Take 1 tablet (75 mg total) by mouth daily. 11/16/13  Yes Elvina Sidle, MD  DULoxetine (CYMBALTA) 30 MG capsule Take 2 capsules (60 mg total) by  mouth 2 (two) times daily. 11/16/13  Yes Elvina Sidle, MD  lamoTRIgine (LAMICTAL) 100 MG tablet Take 1 tablet (100 mg total) by mouth daily. 11/16/13  Yes Elvina Sidle, MD  levothyroxine (SYNTHROID, LEVOTHROID) 125 MCG tablet Take 1 tablet (125 mcg total) by mouth daily. 11/16/13  Yes Elvina Sidle, MD  lisinopril (PRINIVIL,ZESTRIL) 20 MG tablet TAKE 1 TABLET BY MOUTH DAILY. 11/29/13  Yes Elvina Sidle, MD  topiramate (TOPAMAX) 50 MG tablet Take 1 tablet (50 mg total) by mouth 2 (two) times daily. Start one tablet at night x 1 week then twice daily 01/03/14  Yes Micki Riley, MD  traMADol (ULTRAM) 50 MG tablet Take 2 tablets (100 mg total) by mouth every 6 (six) hours as needed for moderate pain. 01/03/14  Yes  Micki Riley, MD    Family History  Family History  Problem Relation Age of Onset  . COPD Mother   . Heart disease Mother   . Arthritis Mother     Rheumatoid and PMR  . Heart attack Father   . Depression Father   . Hypertension Father   . Osteoporosis Mother     Mom fractured hip  . Diabetes type II Mother     Social History  History   Social History  . Marital Status: Divorced    Spouse Name: N/A    Number of Children: 2  . Years of Education: N/A   Occupational History  . DISABLE     Social History Main Topics  . Smoking status: Former Smoker -- 0.25 packs/day for 30 years    Types: Cigarettes    Quit date: 02/14/2014  . Smokeless tobacco: Never Used  . Alcohol Use: No  . Drug Use: No  . Sexual Activity: Not on file   Other Topics Concern  . Not on file   Social History Narrative   Divorced   After stroke was living with sister and mother after sister asked her to leave.   Difficulties over control.    Then moved back in for about 6 weeks.   . tranportaion difficult son  helping   Ex-smoker   Was living at friends  Sister and her had argument and she was told to leave .       She is now living in a homeless shelter for the last 3 weeks [facility]    son had help with transportation and medication       Work status regular  before got sick on short-term disability now lost t insurance after the stroke and couldn't work was  denied ssi    2 times.  She is not eligible for Medicaid because she doesn't have dependent children.         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  Cardiovascular:  No chest pain, dyspnea on exertion, edema, orthopnea, palpitations, paroxysmal nocturnal dyspnea. Dermatological: No rash, lesions/masses Respiratory: No cough, dyspnea Urologic: No hematuria, dysuria Abdominal:    No nausea, vomiting, diarrhea, bright red blood per rectum, melena, or hematemesis Neurologic:  No visual changes, wkns, changes in mental status. All other systems reviewed and are otherwise negative except as noted above.  Physical Exam  Blood pressure 122/80, pulse 116, height 5' 2.5" (1.588 m), weight 172 lb (78.019 kg).  General: Pleasant, NAD Psych: Normal affect. Neuro: Alert and oriented  X 3. Moves all extremities spontaneously. HEENT: Normal  Neck: Supple without bruits or JVD. Lungs:  Resp regular and unlabored, CTA. Heart: RRR no s3, s4, or murmurs. Abdomen: Soft, non-tender, non-distended, BS + x 4.  Extremities: No clubbing, cyanosis or edema. DP/PT/Radials 2+ and equal bilaterally.  Labs:  No results found for this basename: CKTOTAL, CKMB, TROPONINI,  in the last 72 hours Lab Results  Component Value Date   WBC 6.4 01/08/2014   HGB 12.7 01/08/2014   HCT 38.9 01/08/2014   MCV 98.5* 01/08/2014   PLT 224 12/31/2011       Component Value Date/Time   NA 141 01/08/2014 1106   K 4.1 01/08/2014 1106   CL 108 01/08/2014 1106   CO2 25 01/08/2014 1106   GLUCOSE 89 01/08/2014 1106   BUN 11 01/08/2014 1106   CREATININE 1.12* 01/08/2014 1106   CREATININE 0.85 12/31/2011 1505   CALCIUM 9.1 01/08/2014 1106   PROT 6.7 01/08/2014 1106   ALBUMIN 4.2 01/08/2014 1106   AST 15 01/08/2014 1106   ALT 12 01/08/2014 1106   ALKPHOS 72 01/08/2014 1106   BILITOT 0.6 01/08/2014 1106   GFRNONAA 76* 12/31/2011 1505   GFRAA 88* 12/31/2011 1505   Lab Results  Component Value Date   CHOL 237* 11/16/2013   HDL 68 11/16/2013   LDLCALC 152* 11/16/2013   TRIG 85 11/16/2013   Accessory Clinical Findings  Echocardiogram - 12/27/2009 Left ventricle: The cavity size was normal. Wall thickness was increased in a pattern of mild LVH. There was mild focal basal hypertrophy of the septum. Systolic function was normal. The estimated ejection fraction was in the range of 60% to 65%. Regional wall motion abnormalities cannot be  excluded. Doppler parameters are consistent with abnormal left ventricular relaxation (grade 1 diastolic dysfunction). - Mitral valve: Calcified annulus. - Left atrium: The atrium was mildly dilated. - Right ventricle: The cavity size was mildly dilated. - Right atrium: The atrium was mildly dilated. Impressions:  - Technically difficult; LV function is vigorous; focal basal septal hypertrophy but no significant LVOT gradient at rest.  ECG - atrial fibrillation with rapid ventricular response, 116 beats per minute diffuse ST depressions in inferior anterolateral leads    Assessment & Plan  58 year old female  1. new diagnosis of atrial fibrillation with rapid ventricular response - with symptoms of heart failure and resting shortness of breath. I don't feel comfortable sending the patient with these symptoms home and we'll send her to the emergency room for further management. The patient needs to be anticoagulated as soon as possible especially since she has previous history of stroke. Considering her rapid ventricular response and relatively low blood pressure I would recommend to schedule a TEE with cardioversion for tomorrow.   2. acute heart failure - most probably tachycardia-induced, she will need repeat echocardiogram since she hasn't had one in the last 4 years. The last one in 2011 showed preserved left ventricular ejection fraction with impaired right station and mildly dilated left atrium.  3. CAD - the patient has diffuse ST depressions however those might be just related to rapid ventricular response. If does persist after cardioversion she might need further ischemic workup. Continue aspirin, atorvastatin, lisinopril. Start beta blocker if blood pressure allows.  4. smoking cessation - continue nicotine patch  She will be transported with EMS and admitted to stepdown  Lars MassonNELSON, Graysin Luczynski H, MD, [facility]aksFACC 02/16/2014, 2:49 PM

## 2014-02-16 NOTE — Patient Instructions (Addendum)
Dr Delton See is referring you to the Hospital for Afib with RVR, and SOB.   You will be a direct admit to [facility] at the new [facility] Step Down Unit 2 Heart.   [facility] will transport you telemetry to the Hospital to Step Down.

## 2014-02-16 NOTE — Progress Notes (Signed)
Pt being admitted from office for AF-RVR and SOB. Per request by Dr. Delton See, have scheduled for [facility] for tomorrow at 12pm with Dr. Tenny Craw. Linton Stolp PA-C

## 2014-02-16 NOTE — Progress Notes (Signed)
Heart rate is 120's to 130's, bp-150/118 asymptomatic, PA made aware. Lopressor 5 mg iv given continue to monitor.

## 2014-02-17 ENCOUNTER — Inpatient Hospital Stay (HOSPITAL_COMMUNITY): Admission: RE | Admit: 2014-02-17 | Payer: Medicare Other | Source: Ambulatory Visit | Admitting: Internal Medicine

## 2014-02-17 ENCOUNTER — Encounter (HOSPITAL_COMMUNITY): Payer: Medicare Other | Admitting: Anesthesiology

## 2014-02-17 ENCOUNTER — Encounter (HOSPITAL_COMMUNITY): Payer: Self-pay | Admitting: *Deleted

## 2014-02-17 ENCOUNTER — Inpatient Hospital Stay (HOSPITAL_COMMUNITY): Payer: Medicare Other | Admitting: Anesthesiology

## 2014-02-17 ENCOUNTER — Other Ambulatory Visit: Payer: Self-pay

## 2014-02-17 ENCOUNTER — Encounter (HOSPITAL_COMMUNITY)
Admission: AD | Disposition: A | Discharge status: Home / Self Care | Payer: Self-pay | Source: Ambulatory Visit | Attending: Cardiology

## 2014-02-17 DIAGNOSIS — I4891 Unspecified atrial fibrillation: Secondary | ICD-10-CM

## 2014-02-17 HISTORY — PX: CARDIOVERSION: SHX1299

## 2014-02-17 HISTORY — PX: TEE WITHOUT CARDIOVERSION: SHX5443

## 2014-02-17 LAB — CBC
HCT: 33.7 % — ABNORMAL LOW (ref 36.0–46.0)
Hemoglobin: 11.5 g/dL — ABNORMAL LOW (ref 12.0–15.0)
MCH: 31.5 pg (ref 26.0–34.0)
MCHC: 34.1 g/dL (ref 30.0–36.0)
MCV: 92.3 fL (ref 78.0–100.0)
PLATELETS: 188 10*3/uL (ref 150–400)
RBC: 3.65 MIL/uL — AB (ref 3.87–5.11)
RDW: 12.8 % (ref 11.5–15.5)
WBC: 5.1 10*3/uL (ref 4.0–10.5)

## 2014-02-17 LAB — HEPARIN LEVEL (UNFRACTIONATED)
Heparin Unfractionated: 0.33 IU/mL (ref 0.30–0.70)
Heparin Unfractionated: 0.36 IU/mL (ref 0.30–0.70)

## 2014-02-17 SURGERY — ECHOCARDIOGRAM, TRANSESOPHAGEAL
Anesthesia: Monitor Anesthesia Care

## 2014-02-17 MED ORDER — MIDAZOLAM HCL 5 MG/5ML IJ SOLN
INTRAMUSCULAR | Status: DC | PRN
  Administered 2014-02-17 (×2): 0.5 mg via INTRAVENOUS
  Administered 2014-02-17: 2 mg via INTRAVENOUS

## 2014-02-17 MED ORDER — LACTATED RINGERS IV SOLN: INTRAVENOUS | Status: DC | PRN

## 2014-02-17 MED ORDER — FENTANYL CITRATE 0.05 MG/ML IJ SOLN
INTRAMUSCULAR | Status: DC | PRN
  Administered 2014-02-17: 50 ug via INTRAVENOUS

## 2014-02-17 MED ORDER — FENTANYL CITRATE 0.05 MG/ML IJ SOLN
INTRAMUSCULAR | Status: AC
  Filled 2014-02-17: qty 5

## 2014-02-17 MED ORDER — MIDAZOLAM HCL 2 MG/2ML IJ SOLN
INTRAMUSCULAR | Status: AC
  Filled 2014-02-17: qty 2

## 2014-02-17 MED ORDER — MIDAZOLAM HCL 2 MG/2ML IJ SOLN
INTRAMUSCULAR | Status: AC
Start: 2014-02-17 — End: 2014-02-17
  Filled 2014-02-17: qty 2

## 2014-02-17 MED ORDER — LIDOCAINE HCL (CARDIAC) 20 MG/ML IV SOLN
INTRAVENOUS | Status: DC | PRN
  Administered 2014-02-17: 50 mg via INTRAVENOUS

## 2014-02-17 MED ORDER — EPHEDRINE SULFATE 50 MG/ML IJ SOLN
INTRAMUSCULAR | Status: DC | PRN
  Administered 2014-02-17: 10 mg via INTRAVENOUS
  Administered 2014-02-17 (×2): 5 mg via INTRAVENOUS

## 2014-02-17 MED ORDER — SODIUM CHLORIDE 0.9 % IV SOLN
INTRAVENOUS | Status: DC
Start: 2014-02-18 — End: 2014-02-17
  Administered 2014-02-17: 11:00:00 via INTRAVENOUS
  Administered 2014-02-17: 500 mL via INTRAVENOUS

## 2014-02-17 MED ORDER — SODIUM CHLORIDE 0.9 % IV SOLN: 250.0000 mL | INTRAVENOUS | Status: DC

## 2014-02-17 MED ORDER — SODIUM CHLORIDE 0.9 % IV SOLN
INTRAVENOUS | Status: DC | PRN
  Administered 2014-02-17: 11:00:00 via INTRAVENOUS

## 2014-02-17 MED ORDER — PROPOFOL 10 MG/ML IV BOLUS
INTRAVENOUS | Status: DC | PRN
Start: 2014-02-17 — End: 2014-02-17
  Administered 2014-02-17: 30 mg via INTRAVENOUS
  Administered 2014-02-17: 20 mg via INTRAVENOUS

## 2014-02-17 NOTE — Op Note (Signed)
Patient anesthetized with 40 mg propofol IV  With pads in AP position patient cardioverted to SB with 200 J synchronized biphasic energy.

## 2014-02-17 NOTE — Progress Notes (Signed)
ANTICOAGULATION CONSULT NOTE - Follow Up Consult  Pharmacy Consult for Heparin  Indication: atrial fibrillation  Allergies  Allergen Reactions  . Amoxicillin Hives  . Avelox [Moxifloxacin Hcl In Nacl] Other (See Comments)    Mental breakdown.  [name] Hydrocodone Itching  . Penicillins Hives  . Sulfa Antibiotics Other (See Comments)    unknown   Patient Measurements: Height: 5\' 3"  (160 cm) Weight: 177 lb 7.5 oz (80.5 kg) IBW/kg (Calculated) : 52.4 Heparin Dosing Weight: 70 kg  Vital Signs: Temp: 98.1 F (36.7 C) (06/11 0000) Temp src: Oral (06/11 0000) BP: 129/98 mmHg (06/11 0000) Pulse Rate: 97 (06/11 0000)  Labs:  Recent Labs  02/16/14 1832 02/17/14 0050  HGB 12.0 11.5*  HCT 34.5* 33.7*  PLT 186 188  LABPROT 14.9  --   INR 1.20  --   HEPARINUNFRC  --  0.33   Estimated Creatinine Clearance: 56.3 ml/min (by C-G formula based on Cr of 1.12).  Medications:  Heparin 1000 units/hr  Assessment: 58 y/o F on heparin for afib. HL is 0.33. Other labs as above.   Goal of Therapy:  Heparin level 0.3-0.7 units/ml Monitor platelets by anticoagulation protocol: Yes   Plan:  -Continue heparin at 1000 units/hr -0800 HL to confirm -Daily CBC/HL -Monitor for bleeding  Abran Duke 02/17/2014,2:27 AM

## 2014-02-17 NOTE — Anesthesia Preprocedure Evaluation (Signed)
Anesthesia Evaluation  Patient identified by MRN, date of birth, ID band Patient awake    Reviewed: Allergy & Precautions, H&P , NPO status , Patient's Chart, lab work & pertinent test results  History of Anesthesia Complications Negative for: history of anesthetic complications  Airway Mallampati: III TM Distance: >3 FB Neck ROM: Full    Dental  (+)    Pulmonary shortness of breath and with exertion, sleep apnea and Continuous Positive Airway Pressure Ventilation , COPDformer smoker,  breath sounds clear to auscultation        Cardiovascular hypertension, Pt. on medications + angina + CAD, + Cardiac Stents, + CABG and + DOE + dysrhythmias Atrial Fibrillation Rhythm:Irregular     Neuro/Psych  Headaches, PSYCHIATRIC DISORDERS Depression Left hand numbness CVA, Residual Symptoms    GI/Hepatic Neg liver ROS, GERD-  Poorly Controlled,  Endo/Other  Hypothyroidism   Renal/GU negative Renal ROS     Musculoskeletal   Abdominal   Peds  Hematology  (+) anemia ,   Anesthesia Other Findings   Reproductive/Obstetrics                           Anesthesia Physical Anesthesia Plan  ASA: III  Anesthesia Plan: MAC   Post-op Pain Management:    Induction: Intravenous  Airway Management Planned: Natural Airway and Nasal Cannula  Additional Equipment: None  Intra-op Plan:   Post-operative Plan:   Informed Consent: I have reviewed the patients History and Physical, chart, labs and discussed the procedure including the risks, benefits and alternatives for the proposed anesthesia with the patient or authorized representative who has indicated his/her understanding and acceptance.   Dental advisory given  Plan Discussed with: CRNA and Surgeon  Anesthesia Plan Comments:         Anesthesia Quick Evaluation

## 2014-02-17 NOTE — Progress Notes (Signed)
ANTICOAGULATION CONSULT NOTE   Pharmacy Consult for Heparin  Indication: atrial fibrillation  Allergies  Allergen Reactions  . Amoxicillin Hives  . Avelox [Moxifloxacin Hcl In Nacl] Other (See Comments)    Mental breakdown.  [name] Hydrocodone Itching  . Penicillins Hives  . Sulfa Antibiotics Other (See Comments)    unknown   Patient Measurements: Height: 5\' 3"  (160 cm) Weight: 177 lb 7.5 oz (80.5 kg) IBW/kg (Calculated) : 52.4 Heparin Dosing Weight: 70 kg  Vital Signs: Temp: 98.4 F (36.9 C) (06/11 0746) Temp src: Oral (06/11 0746) BP: 116/76 mmHg (06/11 0746) Pulse Rate: 64 (06/11 0746)  Labs:  Recent Labs  02/16/14 1832 02/17/14 0050 02/17/14 0800  HGB 12.0 11.5*  --   HCT 34.5* 33.7*  --   PLT 186 188  --   LABPROT 14.9  --   --   INR 1.20  --   --   HEPARINUNFRC  --  0.33 0.36   Estimated Creatinine Clearance: 56.3 ml/min (by C-G formula based on Cr of 1.12).  Medications:  Heparin 1000 units/hr  Assessment: 58 y/o F on heparin for afib. HL is at goal on 1000 units/hr. No bleeding issues noted. Plan for TEE/DCCV today.  Goal of Therapy:  Heparin level 0.3-0.7 units/ml Monitor platelets by anticoagulation protocol: Yes   Plan:  -Continue heparin at 1000 units/hr -Daily CBC/HL -Monitor for bleeding  Sheppard Coil PharmD., BCPS Clinical Pharmacist Pager [phone] 02/17/2014 9:36 AM

## 2014-02-17 NOTE — Progress Notes (Signed)
Back from endoscopy awake and alert. breathing easy and regular, on 02 2l [place]

## 2014-02-17 NOTE — Progress Notes (Signed)
Echocardiogram Echocardiogram Transesophageal has been performed.  Dorothey Baseman 02/17/2014, 11:21 AM

## 2014-02-17 NOTE — Anesthesia Postprocedure Evaluation (Signed)
  Anesthesia Post-op Note  Patient: [name]  Procedure(s) Performed: Procedure(s): TRANSESOPHAGEAL ECHOCARDIOGRAM (TEE) (N/A) CARDIOVERSION (N/A)  Patient Location: Endoscopy Unit  Anesthesia Type:General  Level of Consciousness: awake, alert , oriented and patient cooperative  Airway and Oxygen Therapy: Patient Spontanous Breathing and Patient connected to nasal cannula oxygen  Post-op Pain: none  Post-op Assessment: Post-op Vital signs reviewed, Patient's Cardiovascular Status Stable, Respiratory Function Stable, Patent Airway, No signs of Nausea or vomiting, Adequate PO intake, Pain level controlled, No headache, No backache, No residual numbness and No residual motor weakness  Post-op Vital Signs: Reviewed and stable  Last Vitals:  Filed Vitals:   02/17/14 1118  BP: 87/53  Pulse:   Temp:   Resp: 27    Complications: No apparent anesthesia complications

## 2014-02-17 NOTE — Care Management Note (Signed)
    Page 1 of 1   02/17/2014     8:44:09 AM CARE MANAGEMENT NOTE 02/17/2014  Patient:  [name], [name]   Account Number:  [id number]  Date Initiated:  02/17/2014  Documentation initiated by:  Junius Creamer  Subjective/Objective Assessment:   adm w at fib     Action/Plan:   lives w roommates, pcp dr Berniece Andreas   Anticipated DC Date:     Anticipated DC Plan:           Choice offered to / List presented to:             Status of service:   Medicare Important Message given?   (If response is "NO", the following Medicare IM given date fields will be blank) Date Medicare IM given:   Date Additional Medicare IM given:    Discharge Disposition:    Per UR Regulation:  Reviewed for med. necessity/level of care/duration of stay  If discussed at Long Length of Stay Meetings, dates discussed:    Comments:

## 2014-02-17 NOTE — Op Note (Signed)
LA, LA appendage with smoke but no thrombus.  Prominent pectinate muscles LVEF appears to be mildly depressed. Full report to follow.

## 2014-02-17 NOTE — Progress Notes (Signed)
Nursing: Discussed with patient the plan of care for the night shift. This RN asked questions regarding living situation. Patient reports that she is currently living with a roommate and has no financial concerns. I clarified this due to part of the note from Dr. Delton See regarding her being homeless and possible needing social worker consult.

## 2014-02-17 NOTE — H&P (View-Only) (Signed)
Patient ID: [name], female   DOB: 04/17/1956, 58 y.o.   MRN: [id number]     Patient Name: [name] Date of Encounter: 02/16/2014  Primary Care Provider:  PANOSH,[name], MD Primary Cardiologist:  Naeemah Jasmer H  Problem List   Past Medical History  Diagnosis Date  . CAD (coronary artery disease)     cath 09/2010: LAD stent patent, S-Int/dCFX ok, S-PDA ok, L-LAD atretic  . Hypertension   . Hx of CABG   . Diastolic dysfunction     TEE 11/2010: EF 60-65%, BAE, trivial atrial septal shunt;  right heart cath in 10/2010 with elevated R and L heart pressures and diuretic started  . HLD (hyperlipidemia)   . Hypothyroidism   . Sleep apnea   . COPD (chronic obstructive pulmonary disease)   . Pulmonary nodules     repeat CT due in 11/2011  . Acute right MCA stroke 11/07/10  . 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 Eczema   . Depression    Past Surgical History  Procedure Laterality Date  . Coronary artery bypass graft    . Debriment for infection in chest    . Chest wall reconstruction    . Bilateral knee surgery    . Bladder surgery    . Left mastoidectomy    . Hernia repair    . Cath 2012      Allergies  Allergies  Allergen Reactions  . Amoxicillin Hives  . Avelox [Moxifloxacin Hcl In Nacl] Other (See Comments)    Mental breakdown.  . Hydrocodone Itching  . Penicillins Hives  . Sulfa Antibiotics Other (See Comments)    unknown    HPI  58-year-old female with prior medical history of coronary artery disease, tension, hyperlipidemia, stroke who is coming for concern of progressively worsening shortness of breath and orthopnea. The patient has a prior medical history of coronary artery bypass disease in 2008 and she was followed in our clinic by Dr. Stuckey until approximately 20 years ago. She afterwards had a stroke became homeless and moved to [place].  She not get back to [place] about 6 months ago and was seen in urgent care  for a spider bite which she was told by a physician that her heartbeat are irregular. The patient mentions that over the last months she has noted progressively worsening shortness of breath, orthopnea and paroxysmal nocturnal dyspnea in the last week and inability to sleep. She is currently short of breath at rest and gasping for breath she has noticed that her heart rate has been elevated and on exertion could be as high as 180 beats per minute. She attributed this to smoking, so she quit just 3 days ago and is currently using nicotine patches. She denies any chest pain, syncope.  Home Medications  Prior to Admission medications   Medication Sig Start Date End Date Taking? Authorizing Provider  ABILIFY 10 MG tablet TAKE 1 TABLET (10 MG TOTAL) BY MOUTH DAILY. 11/29/13  Yes Kurt Lauenstein, MD  aspirin EC 81 MG tablet Take 1 tablet (81 mg total) by mouth daily. 10/28/13  Yes Jeffery Anderson, MD  atorvastatin (LIPITOR) 40 MG tablet Take 1 tablet (40 mg total) by mouth daily. 11/16/13  Yes Kurt Lauenstein, MD  clopidogrel (PLAVIX) 75 MG tablet Take 1 tablet (75 mg total) by mouth daily. 11/16/13  Yes Kurt Lauenstein, MD  DULoxetine (CYMBALTA) 30 MG capsule Take 2 capsules (60 mg total) by   mouth 2 (two) times daily. 11/16/13  Yes Kurt Lauenstein, MD  lamoTRIgine (LAMICTAL) 100 MG tablet Take 1 tablet (100 mg total) by mouth daily. 11/16/13  Yes Kurt Lauenstein, MD  levothyroxine (SYNTHROID, LEVOTHROID) 125 MCG tablet Take 1 tablet (125 mcg total) by mouth daily. 11/16/13  Yes Kurt Lauenstein, MD  lisinopril (PRINIVIL,ZESTRIL) 20 MG tablet TAKE 1 TABLET BY MOUTH DAILY. 11/29/13  Yes Kurt Lauenstein, MD  topiramate (TOPAMAX) 50 MG tablet Take 1 tablet (50 mg total) by mouth 2 (two) times daily. Start one tablet at night x 1 week then twice daily 01/03/14  Yes Pramod S Sethi, MD  traMADol (ULTRAM) 50 MG tablet Take 2 tablets (100 mg total) by mouth every 6 (six) hours as needed for moderate pain. 01/03/14  Yes  Pramod S Sethi, MD    Family History  Family History  Problem Relation Age of Onset  . COPD Mother   . Heart disease Mother   . Arthritis Mother     Rheumatoid and PMR  . Heart attack Father   . Depression Father   . Hypertension Father   . Osteoporosis Mother     Mom fractured hip  . Diabetes type II Mother     Social History  History   Social History  . Marital Status: Divorced    Spouse Name: N/A    Number of Children: 2  . Years of Education: N/A   Occupational History  . DISABLE     Social History Main Topics  . Smoking status: Former Smoker -- 0.25 packs/day for 30 years    Types: Cigarettes    Quit date: 02/14/2014  . Smokeless tobacco: Never Used  . Alcohol Use: No  . Drug Use: No  . Sexual Activity: Not on file   Other Topics Concern  . Not on file   Social History Narrative   Divorced   After stroke was living with sister and mother after sister asked her to leave.   Difficulties over control.    Then moved back in for about 6 weeks.   . tranportaion difficult son  helping   Ex-smoker   Was living at friends  Sister and her had argument and she was told to leave .       She is now living in a homeless shelter for the last 3 weeks [facility]    son had help with transportation and medication       Work status regular  before got sick on short-term disability now lost t insurance after the stroke and couldn't work was  denied ssi    2 times.  She is not eligible for Medicaid because she doesn't have dependent children.         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  Cardiovascular:  No chest pain, dyspnea on exertion, edema, orthopnea, palpitations, paroxysmal nocturnal dyspnea. Dermatological: No rash, lesions/masses Respiratory: No cough, dyspnea Urologic: No hematuria, dysuria Abdominal:    No nausea, vomiting, diarrhea, bright red blood per rectum, melena, or hematemesis Neurologic:  No visual changes, wkns, changes in mental status. All other systems reviewed and are otherwise negative except as noted above.  Physical Exam  Blood pressure 122/80, pulse 116, height 5' 2.5" (1.588 m), weight 172 lb (78.019 kg).  General: Pleasant, NAD Psych: Normal affect. Neuro: Alert and oriented   X 3. Moves all extremities spontaneously. HEENT: Normal  Neck: Supple without bruits or JVD. Lungs:  Resp regular and unlabored, CTA. Heart: RRR no s3, s4, or murmurs. Abdomen: Soft, non-tender, non-distended, BS + x 4.  Extremities: No clubbing, cyanosis or edema. DP/PT/Radials 2+ and equal bilaterally.  Labs:  No results found for this basename: CKTOTAL, CKMB, TROPONINI,  in the last 72 hours Lab Results  Component Value Date   WBC 6.4 01/08/2014   HGB 12.7 01/08/2014   HCT 38.9 01/08/2014   MCV 98.5* 01/08/2014   PLT 224 12/31/2011       Component Value Date/Time   NA 141 01/08/2014 1106   K 4.1 01/08/2014 1106   CL 108 01/08/2014 1106   CO2 25 01/08/2014 1106   GLUCOSE 89 01/08/2014 1106   BUN 11 01/08/2014 1106   CREATININE 1.12* 01/08/2014 1106   CREATININE 0.85 12/31/2011 1505   CALCIUM 9.1 01/08/2014 1106   PROT 6.7 01/08/2014 1106   ALBUMIN 4.2 01/08/2014 1106   AST 15 01/08/2014 1106   ALT 12 01/08/2014 1106   ALKPHOS 72 01/08/2014 1106   BILITOT 0.6 01/08/2014 1106   GFRNONAA 76* 12/31/2011 1505   GFRAA 88* 12/31/2011 1505   Lab Results  Component Value Date   CHOL 237* 11/16/2013   HDL 68 11/16/2013   LDLCALC 152* 11/16/2013   TRIG 85 11/16/2013   Accessory Clinical Findings  Echocardiogram - 12/27/2009 Left ventricle: The cavity size was normal. Wall thickness was increased in a pattern of mild LVH. There was mild focal basal hypertrophy of the septum. Systolic function was normal. The estimated ejection fraction was in the range of 60% to 65%. Regional wall motion abnormalities cannot be  excluded. Doppler parameters are consistent with abnormal left ventricular relaxation (grade 1 diastolic dysfunction). - Mitral valve: Calcified annulus. - Left atrium: The atrium was mildly dilated. - Right ventricle: The cavity size was mildly dilated. - Right atrium: The atrium was mildly dilated. Impressions:  - Technically difficult; LV function is vigorous; focal basal septal hypertrophy but no significant LVOT gradient at rest.  ECG - atrial fibrillation with rapid ventricular response, 116 beats per minute diffuse ST depressions in inferior anterolateral leads    Assessment & Plan  58-year-old female  1. new diagnosis of atrial fibrillation with rapid ventricular response - with symptoms of heart failure and resting shortness of breath. I don't feel comfortable sending the patient with these symptoms home and we'll send her to the emergency room for further management. The patient needs to be anticoagulated as soon as possible especially since she has previous history of stroke. Considering her rapid ventricular response and relatively low blood pressure I would recommend to schedule a TEE with cardioversion for tomorrow.   2. acute heart failure - most probably tachycardia-induced, she will need repeat echocardiogram since she hasn't had one in the last 4 years. The last one in 2011 showed preserved left ventricular ejection fraction with impaired right station and mildly dilated left atrium.  3. CAD - the patient has diffuse ST depressions however those might be just related to rapid ventricular response. If does persist after cardioversion she might need further ischemic workup. Continue aspirin, atorvastatin, lisinopril. Start beta blocker if blood pressure allows.  4. smoking cessation - continue nicotine patch  She will be transported with EMS and admitted to stepdown  Ngina Royer H, MD, FACC 02/16/2014, 2:49 PM     

## 2014-02-17 NOTE — Interval H&P Note (Signed)
History and Physical Interval Note:  02/17/2014 10:33 AM  [name]  has presented today for surgery, with the diagnosis of A FIB   The various methods of treatment have been discussed with the patient and family. After consideration of risks, benefits and other options for treatment, the patient has consented to  Procedure(s): TRANSESOPHAGEAL ECHOCARDIOGRAM (TEE) (N/A) CARDIOVERSION (N/A) as a surgical intervention .  The patient's history has been reviewed, patient examined, no change in status, stable for surgery.  I have reviewed the patient's chart and labs.  Questions were answered to the patient's satisfaction.     Dietrich Pates

## 2014-02-17 NOTE — Transfer of Care (Signed)
Immediate Anesthesia Transfer of Care Note  Patient: [name]  Procedure(s) Performed: Procedure(s): TRANSESOPHAGEAL ECHOCARDIOGRAM (TEE) (N/A) CARDIOVERSION (N/A)  Patient Location: Endoscopy Unit  Anesthesia Type:General  Level of Consciousness: awake, alert , oriented, patient cooperative and responds to stimulation  Airway & Oxygen Therapy: Patient Spontanous Breathing and Patient connected to face mask oxygen  Post-op Assessment: Report given to PACU RN and Post -op Vital signs reviewed and stable  Post vital signs: Reviewed and stable  Complications: No apparent anesthesia complications

## 2014-02-17 NOTE — Progress Notes (Signed)
To endoscopy for TEE/Cardioversion by bed stable.

## 2014-02-18 ENCOUNTER — Encounter (HOSPITAL_COMMUNITY): Payer: Self-pay | Admitting: Internal Medicine

## 2014-02-18 ENCOUNTER — Encounter: Payer: Self-pay | Admitting: Neurology

## 2014-02-18 DIAGNOSIS — I5041 Acute combined systolic (congestive) and diastolic (congestive) heart failure: Secondary | ICD-10-CM

## 2014-02-18 DIAGNOSIS — E785 Hyperlipidemia, unspecified: Secondary | ICD-10-CM | POA: Diagnosis present

## 2014-02-18 DIAGNOSIS — I429 Cardiomyopathy, unspecified: Secondary | ICD-10-CM

## 2014-02-18 DIAGNOSIS — I428 Other cardiomyopathies: Secondary | ICD-10-CM

## 2014-02-18 DIAGNOSIS — I2510 Atherosclerotic heart disease of native coronary artery without angina pectoris: Secondary | ICD-10-CM

## 2014-02-18 DIAGNOSIS — E039 Hypothyroidism, unspecified: Secondary | ICD-10-CM

## 2014-02-18 DIAGNOSIS — F4321 Adjustment disorder with depressed mood: Secondary | ICD-10-CM

## 2014-02-18 HISTORY — DX: Cardiomyopathy, unspecified: I42.9

## 2014-02-18 LAB — COMPREHENSIVE METABOLIC PANEL
ALT: 7 U/L (ref 0–35)
AST: 9 U/L (ref 0–37)
Albumin: 3.2 g/dL — ABNORMAL LOW (ref 3.5–5.2)
Alkaline Phosphatase: 59 U/L (ref 39–117)
BUN: 12 mg/dL (ref 6–23)
CO2: 25 mEq/L (ref 19–32)
Calcium: 8.3 mg/dL — ABNORMAL LOW (ref 8.4–10.5)
Chloride: 110 mEq/L (ref 96–112)
Creatinine, Ser: 0.97 mg/dL (ref 0.50–1.10)
GFR calc Af Amer: 74 mL/min — ABNORMAL LOW (ref >90)
GFR calc non Af Amer: 64 mL/min — ABNORMAL LOW (ref >90)
Glucose, Bld: 98 mg/dL (ref 70–99)
Potassium: 3.1 mEq/L — ABNORMAL LOW (ref 3.7–5.3)
Sodium: 147 mEq/L (ref 137–147)
Total Bilirubin: 0.4 mg/dL (ref 0.3–1.2)
Total Protein: 5.7 g/dL — ABNORMAL LOW (ref 6.0–8.3)

## 2014-02-18 LAB — TSH: TSH: 1.09 u[IU]/mL (ref 0.350–4.500)

## 2014-02-18 LAB — CBC
HCT: 32.6 % — ABNORMAL LOW (ref 36.0–46.0)
Hemoglobin: 10.9 g/dL — ABNORMAL LOW (ref 12.0–15.0)
MCH: 31.3 pg (ref 26.0–34.0)
MCHC: 33.4 g/dL (ref 30.0–36.0)
MCV: 93.7 fL (ref 78.0–100.0)
Platelets: 158 10*3/uL (ref 150–400)
RBC: 3.48 MIL/uL — ABNORMAL LOW (ref 3.87–5.11)
RDW: 13.2 % (ref 11.5–15.5)
WBC: 4.7 10*3/uL (ref 4.0–10.5)

## 2014-02-18 LAB — HEPARIN LEVEL (UNFRACTIONATED): Heparin Unfractionated: 0.31 IU/mL (ref 0.30–0.70)

## 2014-02-18 LAB — T4, FREE: Free T4: 1.78 ng/dL (ref 0.80–1.80)

## 2014-02-18 LAB — PRO B NATRIURETIC PEPTIDE: Pro B Natriuretic peptide (BNP): 1629 pg/mL — ABNORMAL HIGH (ref 0–125)

## 2014-02-18 MED ORDER — ATORVASTATIN CALCIUM 80 MG PO TABS
80.0000 mg | ORAL_TABLET | Freq: Every day | ORAL | Status: DC
  Administered 2014-02-18 – 2014-02-19 (×2): 80 mg via ORAL
  Filled 2014-02-18 (×3): qty 1

## 2014-02-18 MED ORDER — FUROSEMIDE 10 MG/ML IJ SOLN
40.0000 mg | Freq: Two times a day (BID) | INTRAMUSCULAR | Status: DC
  Administered 2014-02-18 – 2014-02-19 (×3): 40 mg via INTRAVENOUS
  Filled 2014-02-18 (×4): qty 4

## 2014-02-18 MED ORDER — POTASSIUM CHLORIDE CRYS ER 20 MEQ PO TBCR
40.0000 meq | EXTENDED_RELEASE_TABLET | ORAL | Status: AC
  Administered 2014-02-18 (×2): 40 meq via ORAL
  Filled 2014-02-18 (×2): qty 2

## 2014-02-18 NOTE — Progress Notes (Signed)
DAILY PROGRESS NOTE  Subjective:  She says she feels better, but appears dyspneic. S/p successful TEE/Cardioversion yesterday. Echocardiogram report not in the chart, however, I have reviewed her limited TEE images. LVEF is probably 40%, at least moderate to severe biatrial enlargement - there is RV dysfunction as well. Remains on IV heparin.   Objective:  Temp:  [97.6 F (36.4 C)-98.9 F (37.2 C)] 98.6 F (37 C) (06/12 0835) Pulse Rate:  [31-97] 56 (06/12 0800) Resp:  [16-33] 33 (06/12 0800) BP: (87-125)/(53-77) 125/73 mmHg (06/12 0800) SpO2:  [83 %-100 %] 96 % (06/12 0800) Weight change:   Intake/Output from previous day: 06/11 0701 - 06/12 0700 In: 870 [P.O.:300; I.V.:570] Out: -   Intake/Output from this shift: Total I/O In: 180 [P.O.:150; I.V.:30] Out: -   Medications: Current Facility-Administered Medications  Medication Dose Route Frequency Provider Last Rate Last Dose  . 0.9 %  sodium chloride infusion  250 mL Intravenous Continuous Lars MassonKatarina H Nelson, MD      . acetaminophen (TYLENOL) tablet 650 mg  650 mg Oral Q6H PRN Lars MassonKatarina H Nelson, MD   650 mg at 02/16/14 2113  . ARIPiprazole (ABILIFY) tablet 10 mg  10 mg Oral Daily Brittainy Simmons, PA-C   10 mg at 02/18/14 [id number]  . aspirin EC tablet 81 mg  81 mg Oral Daily Brittainy Simmons, PA-C   81 mg at 02/18/14 [id number]  . atorvastatin (LIPITOR) tablet 40 mg  40 mg Oral q1800 Brittainy Simmons, PA-C   40 mg at 02/17/14 1721  . DULoxetine (CYMBALTA) DR capsule 60 mg  60 mg Oral BID Brittainy Simmons, PA-C   60 mg at 02/18/14 0937  . heparin ADULT infusion 100 units/mL (25000 units/250 mL)  1,000 Units/hr Intravenous Continuous Lars MassonKatarina H Nelson, MD 10 mL/hr at 02/17/14 1430 1,000 Units/hr at 02/17/14 1430  . lamoTRIgine (LAMICTAL) tablet 100 mg  100 mg Oral Daily Brittainy Simmons, PA-C   100 mg at 02/18/14 0937  . levothyroxine (SYNTHROID, LEVOTHROID) tablet 125 mcg  125 mcg Oral QAC breakfast Lars MassonKatarina H Nelson, MD   125  mcg at 02/18/14 0757  . lisinopril (PRINIVIL,ZESTRIL) tablet 20 mg  20 mg Oral Daily Brittainy Simmons, PA-C   20 mg at 02/18/14 [id number]  . sodium chloride 0.9 % injection 3 mL  3 mL Intravenous Q12H Lars MassonKatarina H Nelson, MD   3 mL at 02/18/14 0938  . sodium chloride 0.9 % injection 3 mL  3 mL Intravenous Q12H Lars MassonKatarina H Nelson, MD   3 mL at 02/18/14 0938  . sodium chloride 0.9 % injection 3 mL  3 mL Intravenous PRN Lars MassonKatarina H Nelson, MD        Physical Exam: General appearance: alert and mild distress Neck: JVD - 3 cm above sternal notch and no carotid bruit Lungs: diminished breath sounds bibasilar Heart: regular rate and rhythm, S1, S2 normal, no murmur, click, rub or gallop Abdomen: soft, non-tender; bowel sounds normal; no masses,  no organomegaly Extremities: extremities normal, atraumatic, no cyanosis or edema Pulses: 2+ and symmetric Skin: Skin color, texture, turgor normal. No rashes or lesions Neurologic: Mental status: Awake and oriented Psych: Ambivalent mood, flat affect, schizoid  Lab Results: Results for orders placed during the hospital encounter of 02/16/14 (from the past 48 hour(s))  MRSA PCR SCREENING     Status: None   Collection Time    02/16/14  4:35 PM      Result Value Ref Range   MRSA by PCR NEGATIVE  NEGATIVE  Comment:            The GeneXpert MRSA Assay (FDA     approved for NASAL specimens     only), is one component of a     comprehensive MRSA colonization     surveillance program. It is not     intended to diagnose MRSA     infection nor to guide or     monitor treatment for     MRSA infections.  CBC     Status: Abnormal   Collection Time    02/16/14  6:32 PM      Result Value Ref Range   WBC 5.5  4.0 - 10.5 K/uL   RBC 3.79 (*) 3.87 - 5.11 MIL/uL   Hemoglobin 12.0  12.0 - 15.0 g/dL   HCT 63.8 (*) 45.3 - 64.6 %   MCV 91.0  78.0 - 100.0 fL   MCH 31.7  26.0 - 34.0 pg   MCHC 34.8  30.0 - 36.0 g/dL   RDW 80.3  21.2 - 24.8 %   Platelets 186  150 -  400 K/uL  PROTIME-INR     Status: None   Collection Time    02/16/14  6:32 PM      Result Value Ref Range   Prothrombin Time 14.9  11.6 - 15.2 seconds   INR 1.20  0.00 - 1.49  CBC     Status: Abnormal   Collection Time    02/17/14 12:50 AM      Result Value Ref Range   WBC 5.1  4.0 - 10.5 K/uL   RBC 3.65 (*) 3.87 - 5.11 MIL/uL   Hemoglobin 11.5 (*) 12.0 - 15.0 g/dL   HCT 25.0 (*) 03.7 - 04.8 %   MCV 92.3  78.0 - 100.0 fL   MCH 31.5  26.0 - 34.0 pg   MCHC 34.1  30.0 - 36.0 g/dL   RDW 88.9  16.9 - 45.0 %   Platelets 188  150 - 400 K/uL  HEPARIN LEVEL (UNFRACTIONATED)     Status: None   Collection Time    02/17/14 12:50 AM      Result Value Ref Range   Heparin Unfractionated 0.33  0.30 - 0.70 IU/mL   Comment:            IF HEPARIN RESULTS ARE BELOW     EXPECTED VALUES, AND PATIENT     DOSAGE HAS BEEN CONFIRMED,     SUGGEST FOLLOW UP TESTING     OF ANTITHROMBIN III LEVELS.  HEPARIN LEVEL (UNFRACTIONATED)     Status: None   Collection Time    02/17/14  8:00 AM      Result Value Ref Range   Heparin Unfractionated 0.36  0.30 - 0.70 IU/mL   Comment:            IF HEPARIN RESULTS ARE BELOW     EXPECTED VALUES, AND PATIENT     DOSAGE HAS BEEN CONFIRMED,     SUGGEST FOLLOW UP TESTING     OF ANTITHROMBIN III LEVELS.  CBC     Status: Abnormal   Collection Time    02/18/14  2:51 AM      Result Value Ref Range   WBC 4.7  4.0 - 10.5 K/uL   RBC 3.48 (*) 3.87 - 5.11 MIL/uL   Hemoglobin 10.9 (*) 12.0 - 15.0 g/dL   HCT 38.8 (*) 82.8 - 00.3 %   MCV 93.7  78.0 - 100.0 fL  MCH 31.3  26.0 - 34.0 pg   MCHC 33.4  30.0 - 36.0 g/dL   RDW 16.1  09.6 - 04.5 %   Platelets 158  150 - 400 K/uL  HEPARIN LEVEL (UNFRACTIONATED)     Status: None   Collection Time    02/18/14  2:51 AM      Result Value Ref Range   Heparin Unfractionated 0.31  0.30 - 0.70 IU/mL   Comment:            IF HEPARIN RESULTS ARE BELOW     EXPECTED VALUES, AND PATIENT     DOSAGE HAS BEEN CONFIRMED,     SUGGEST  FOLLOW UP TESTING     OF ANTITHROMBIN III LEVELS.    Imaging: No results found.  Assessment:  Principal Problem:   Atrial fibrillation with rapid ventricular response Active Problems:   HYPOTHYROIDISM   OBSTRUCTIVE SLEEP APNEA   Coronary artery disease   Acute combined systolic and diastolic heart failure   Cardiomyopathy   Hyperlipidemia   Plan:  1. Check complete 2D echo today - will need ischemic evaluation. Consider stress test tomorrow versus LHC on Monday. She has a history of CAD and prior stent to the LAD by cath in 2012. New cardiomyopathy may be tachycardia-related or secondary to ischemia. Recent cholesterol from 11/16/13 was elevated with LDL of 152. Increase lipitor to 80 mg QHS. Check am TSH/free t4 with labs, BMP, BNP.  Start IV lasix today 40 mg BID and follow urine output. Keep on heparin until coronary evaluation is complete, then could consider switch to oral anticoagulant. No b-blocker at this time due to bradycardia. She wants to leave the hospital but I highly advised against this.  Time Spent Directly with Patient:  30 minutes  Length of Stay:  LOS: 2 days   Chrystie Nose, MD, [facility] Attending Cardiologist [facility]  Demetre Monaco C 02/18/2014, 10:30 AM

## 2014-02-18 NOTE — Progress Notes (Signed)
ANTICOAGULATION CONSULT NOTE   Pharmacy Consult for Heparin  Indication: atrial fibrillation  Allergies  Allergen Reactions  . Amoxicillin Hives  . Avelox [Moxifloxacin Hcl In Nacl] Other (See Comments)    Mental breakdown.  [name] Hydrocodone Itching  . Penicillins Hives  . Sulfa Antibiotics Other (See Comments)    unknown   Patient Measurements: Height: 5\' 3"  (160 cm) Weight: 177 lb 7.5 oz (80.5 kg) IBW/kg (Calculated) : 52.4 Heparin Dosing Weight: 70 kg  Vital Signs: Temp: 98.6 F (37 C) (06/12 0835) Temp src: Oral (06/12 0835) BP: 125/73 mmHg (06/12 0800) Pulse Rate: 56 (06/12 0800)  Labs:  Recent Labs  02/16/14 1832 02/17/14 0050 02/17/14 0800 02/18/14 0251  HGB 12.0 11.5*  --  10.9*  HCT 34.5* 33.7*  --  32.6*  PLT 186 188  --  158  LABPROT 14.9  --   --   --   INR 1.20  --   --   --   HEPARINUNFRC  --  0.33 0.36 0.31   Estimated Creatinine Clearance: 56.3 ml/min (by C-G formula based on Cr of 1.12).  Medications:  Heparin 1000 units/hr  Assessment: 58 y/o F on heparin for afib. HL is at goal on 1000 units/hr. H/h trending down, no bleeding issues noted. Patient was cardioverted yesterday to nsr/sinus brady - HR still in 50s this am.   Goal of Therapy:  Heparin level 0.3-0.7 units/ml Monitor platelets by anticoagulation protocol: Yes   Plan:  -Continue heparin at 1000 units/hr -Daily CBC/HL -Monitor for bleeding  Sheppard Coil PharmD., BCPS Clinical Pharmacist Pager [phone] 02/18/2014 8:36 AM

## 2014-02-19 ENCOUNTER — Inpatient Hospital Stay (HOSPITAL_COMMUNITY): Payer: Medicare Other

## 2014-02-19 ENCOUNTER — Encounter (HOSPITAL_COMMUNITY): Payer: Self-pay | Admitting: Physician Assistant

## 2014-02-19 ENCOUNTER — Other Ambulatory Visit: Payer: Self-pay

## 2014-02-19 DIAGNOSIS — E876 Hypokalemia: Secondary | ICD-10-CM

## 2014-02-19 DIAGNOSIS — I428 Other cardiomyopathies: Secondary | ICD-10-CM

## 2014-02-19 LAB — BASIC METABOLIC PANEL
BUN: 14 mg/dL (ref 6–23)
CALCIUM: 8.5 mg/dL (ref 8.4–10.5)
CO2: 23 meq/L (ref 19–32)
Chloride: 102 mEq/L (ref 96–112)
Creatinine, Ser: 0.92 mg/dL (ref 0.50–1.10)
GFR calc Af Amer: 79 mL/min — ABNORMAL LOW (ref >90)
GFR calc non Af Amer: 68 mL/min — ABNORMAL LOW (ref >90)
GLUCOSE: 93 mg/dL (ref 70–99)
POTASSIUM: 3.2 meq/L — AB (ref 3.7–5.3)
Sodium: 140 mEq/L (ref 137–147)

## 2014-02-19 LAB — CBC
HEMATOCRIT: 33.8 % — AB (ref 36.0–46.0)
Hemoglobin: 11.6 g/dL — ABNORMAL LOW (ref 12.0–15.0)
MCH: 31.6 pg (ref 26.0–34.0)
MCHC: 34.3 g/dL (ref 30.0–36.0)
MCV: 92.1 fL (ref 78.0–100.0)
Platelets: 164 10*3/uL (ref 150–400)
RBC: 3.67 MIL/uL — ABNORMAL LOW (ref 3.87–5.11)
RDW: 12.9 % (ref 11.5–15.5)
WBC: 5.2 10*3/uL (ref 4.0–10.5)

## 2014-02-19 LAB — POTASSIUM: Potassium: 3.7 mEq/L (ref 3.7–5.3)

## 2014-02-19 LAB — HEPARIN LEVEL (UNFRACTIONATED): Heparin Unfractionated: 0.29 IU/mL — ABNORMAL LOW (ref 0.30–0.70)

## 2014-02-19 MED ORDER — POTASSIUM CHLORIDE CRYS ER 20 MEQ PO TBCR
20.0000 meq | EXTENDED_RELEASE_TABLET | Freq: Two times a day (BID) | ORAL | Status: DC
  Administered 2014-02-19 – 2014-02-20 (×2): 20 meq via ORAL
  Filled 2014-02-19: qty 1
  Filled 2014-02-19: qty 2
  Filled 2014-02-19: qty 1

## 2014-02-19 MED ORDER — FUROSEMIDE 40 MG PO TABS
40.0000 mg | ORAL_TABLET | Freq: Two times a day (BID) | ORAL | Status: DC
  Administered 2014-02-19 – 2014-02-20 (×2): 40 mg via ORAL
  Filled 2014-02-19 (×3): qty 1

## 2014-02-19 MED ORDER — POTASSIUM CHLORIDE CRYS ER 20 MEQ PO TBCR
40.0000 meq | EXTENDED_RELEASE_TABLET | ORAL | Status: AC
  Administered 2014-02-19 (×2): 40 meq via ORAL
  Filled 2014-02-19 (×2): qty 2

## 2014-02-19 MED ORDER — POTASSIUM CHLORIDE CRYS ER 20 MEQ PO TBCR
40.0000 meq | EXTENDED_RELEASE_TABLET | Freq: Once | ORAL | Status: AC
  Administered 2014-02-19: 40 meq via ORAL
  Filled 2014-02-19: qty 2

## 2014-02-19 MED ORDER — TECHNETIUM TC 99M SESTAMIBI GENERIC - CARDIOLITE
30.0000 | Freq: Once | INTRAVENOUS | Status: AC | PRN
  Administered 2014-02-19: 30 via INTRAVENOUS

## 2014-02-19 MED ORDER — RIVAROXABAN 20 MG PO TABS
20.0000 mg | ORAL_TABLET | Freq: Every day | ORAL | Status: DC
  Administered 2014-02-19: 20 mg via ORAL
  Filled 2014-02-19 (×2): qty 1

## 2014-02-19 MED ORDER — REGADENOSON 0.4 MG/5ML IV SOLN
0.4000 mg | Freq: Once | INTRAVENOUS | Status: AC
  Administered 2014-02-19: 0.4 mg via INTRAVENOUS
  Filled 2014-02-19: qty 5

## 2014-02-19 MED ORDER — TECHNETIUM TC 99M SESTAMIBI GENERIC - CARDIOLITE
10.0000 | Freq: Once | INTRAVENOUS | Status: AC | PRN
  Administered 2014-02-19: 10 via INTRAVENOUS

## 2014-02-19 MED ORDER — REGADENOSON 0.4 MG/5ML IV SOLN
INTRAVENOUS | Status: AC
  Filled 2014-02-19: qty 5

## 2014-02-19 NOTE — Progress Notes (Addendum)
ANTICOAGULATION CONSULT NOTE - Follow Up Consult  Pharmacy Consult for Heparin  Indication: atrial fibrillation  Allergies  Allergen Reactions  . Amoxicillin Hives  . Avelox [Moxifloxacin Hcl In Nacl] Other (See Comments)    Mental breakdown.  [name] Hydrocodone Itching  . Penicillins Hives  . Sulfa Antibiotics Other (See Comments)    unknown   Patient Measurements: Height: 5\' 3"  (160 cm) Weight: 177 lb 14.6 oz (80.7 kg) IBW/kg (Calculated) : 52.4 Heparin Dosing Weight: 70 kg  Vital Signs: Temp: 98.1 F (36.7 C) (06/12 2336) Temp src: Oral (06/12 2336) BP: 106/74 mmHg (06/12 2336) Pulse Rate: 54 (06/12 2336)  Labs:  Recent Labs  02/16/14 1832  02/17/14 0050 02/17/14 0800 02/18/14 0251 02/18/14 1205 02/19/14 0259  HGB 12.0  --  11.5*  --  10.9*  --  11.6*  HCT 34.5*  --  33.7*  --  32.6*  --  33.8*  PLT 186  --  188  --  158  --  164  LABPROT 14.9  --   --   --   --   --   --   INR 1.20  --   --   --   --   --   --   HEPARINUNFRC  --   < > 0.33 0.36 0.31  --  0.29*  CREATININE  --   --   --   --   --  0.97 0.92  < > = values in this interval not displayed. Estimated Creatinine Clearance: 68.7 ml/min (by C-G formula based on Cr of 0.92).  Medications:  Heparin 1100 units/hr  Assessment: 58 y/o F on heparin for afib. HL is 0.29. Other labs as above.   Goal of Therapy:  Heparin level 0.3-0.7 units/ml Monitor platelets by anticoagulation protocol: Yes   Plan:  -Increase heparin to 1200 units/hr -1100 HL  -Daily CBC/HL -Monitor for bleeding  Abran Duke 02/19/2014,4:21 AM

## 2014-02-19 NOTE — Progress Notes (Addendum)
  Progress Note   Subjective:  Denies CP.  Still short of breath.  Breathing slightly improved since DCCV. Admitted with newly identified AF RVR EF mild depressed  HR slow with no meds  Objective:  Filed Vitals:   02/18/14 2336 02/19/14 0429 02/19/14 0500 02/19/14 0917  BP: 106/74 112/54  141/94  Pulse: 54 58  57  Temp: 98.1 F (36.7 C) 99 F (37.2 C)    TempSrc: Oral Oral    Resp: 19 18  18   Height:      Weight:   173 lb 4.5 oz (78.6 kg)   SpO2: 98% 99%      Intake/Output from previous day:  Intake/Output Summary (Last 24 hours) at 02/19/14 1033 Last data filed at 02/19/14 0800  Gross per 24 hour  Intake 921.62 ml  Output   3900 ml  Net -2978.38 ml    PHYSICAL EXAM: No acute distress Neck: no JVD Cardiac:  normal S1, S2; RRR; no murmur Lungs:  clear to auscultation bilaterally, no wheezing, rhonchi or rales Abd: soft, nontender, no hepatomegaly Ext: no edema Skin: warm and dry Neuro:  CNs 2-12 intact, no focal abnormalities noted   Lab Results:  Basic Metabolic Panel:  Recent Labs  38/18/40 1205 02/19/14 0259  NA 147 140  K 3.1* 3.2*  CL 110 102  CO2 25 23  GLUCOSE 98 93  BUN 12 14  CREATININE 0.97 0.92  CALCIUM 8.3* 8.5    CBC:  Recent Labs  02/18/14 0251 02/19/14 0259  WBC 4.7 5.2  HGB 10.9* 11.6*  HCT 32.6* 33.8*  MCV 93.7 92.1  PLT 158 164    Cardiac Enzymes: No results found for this basename: CKTOTAL, CKMB, CKMBINDEX, TROPONINI,  in the last 72 hours   Assessment/Plan:   1. Atrial Fibrillation with RVR s/p TEE-DCCV:  Maintaining NSR.  She is on Heparin.  CHADS2-VASc=6.  She will need coumadin vs NOAC.  Will await nuc med results prior to starting.    2. CAD s/p CABG:  Myoview pending today. 3. Cardiomyopathy with EF 40% by TEE:  ? Ischemic vs tachy mediated.  Myoview pending today to assess for ischemia. 4. Acute on Chronic Systolic CHF:  Continue IV lasix. 5. Hypokalemia:  Replete K+. 6. HTN:  Continue Lisinopril. 7. HLD:   Continue Atorvastatin. 8. Disposition:  Lexiscan Myoview done today.  Tolerated well with complaints of HA.  ECG with NSR and inf-lat TWI during infusion.  Images pending. 9. Sinus bradycardia  Tereso Newcomer, PA-C   02/19/2014 10:33 AM  Pager # [phone]   Anxious to go home  Will replete K and recheck this evening Will transition from hep>>Rivaroxaban if myoview unrevealing as i suspect   Home if both occur Will try later and introduce a beta blocker as currently HR is slow

## 2014-02-19 NOTE — Discharge Summary (Signed)
Discharge Summary   Patient ID: [name], MRN: [id number], DOB/AGE: [birth year] 58 y.o.  Admit date: 02/16/2014 Discharge date: 02/20/2014   Primary Care Physician:  Lorretta HarpPANOSH,[name]   Primary Cardiologist:  Dr. Tobias AlexanderKatarina Nelson    Reason for Admission:  A/C Combined Systolic and Diastolic CHF in the setting of AFib with RVR   Primary Discharge Diagnoses:  Principal Problem:   Atrial fibrillation with rapid ventricular response s/p TEE-DCCV (02/17/14) Active Problems:   HYPOTHYROIDISM   OBSTRUCTIVE SLEEP APNEA   Coronary artery disease s/p CABG and prior PCI to LAD   Acute combined systolic and diastolic heart failure   Cardiomyopathy with EF 40% at TEE 02/17/14 (likely tachycardia mediated - Myoview 02/19/14 neg for ischemia with normal EF)   Hyperlipidemia   Hypokalemia     Wt Readings from Last 3 Encounters:  02/20/14 166 lb 3.2 oz (75.388 kg)  02/20/14 166 lb 3.2 oz (75.388 kg)  02/16/14 172 lb (78.019 kg)    Secondary Discharge Diagnoses:   Past Medical History  Diagnosis Date  . CAD (coronary artery disease)     a.  cath 09/2010: LAD stent patent, S-Int/dCFX ok, S-PDA ok, L-LAD atretic;  b. Lexiscan Myoview (02/2014):  no ischemia, EF 55%   . Hypertension   . Hx of CABG   . Hx of transesophageal echocardiography (TEE) for monitoring 11/2010    TEE 11/2010: EF 60-65%, BAE, trivial atrial septal shunt;  right heart cath in 10/2010 with elevated R and L heart pressures and diuretic started  . HLD (hyperlipidemia)   . Hypothyroidism   . Sleep apnea   . COPD (chronic obstructive pulmonary disease)   . Pulmonary nodules     repeat CT due in 11/2011  . Acute right MCA stroke 11/07/10  . 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 Eczema   . Depression   . Atrial fibrillation     a. s/p TEE-DCCV 02/2104; b. Xarelto started  . Cardiomyopathy with EF 40% at TEE 02/17/14 (likely tachycardia mediated - Myoview 02/19/14 neg for ischemia with normal EF)  02/18/2014      Allergies:    Allergies  Allergen Reactions  . Amoxicillin Hives  . Avelox [Moxifloxacin Hcl In Nacl] Other (See Comments)    Mental breakdown.  [name]. Hydrocodone Itching  . Penicillins Hives  . Sulfa Antibiotics Other (See Comments)    unknown      Procedures Performed This Admission:    1. TEE (02/17/14):   Left ventricle: LV systolic function appears mildly depressed. No evidence of thrombus. Aortic valve: AV is mildly thickened. Trace AI. Mitral valve: MV is normal. Mild MR. Left atrium: LA appendage is large with prominent pectinates. There is spontaneous contrast seen sworling but no formed thrombus. There was a thrombusin the atrial cavity or appendage. Right ventricle: RV appears mildly dilated and RVEF is mild to moderately depressed. Tricuspid valve: TV is grossly normal. Mild TR.  2.  DCCV (02/17/14)    Hospital Course:  [name] is a 58 y.o. female with a hx of CAD s/p CABG and prior stent to LAD, HTN, HL, hypothyroidism, OSA, COPD, prior R MCA CVA. Previously followed by Dr.  Shawnie Ponshomas Stuckey.  She had moved to [place] for some time and recently moved back to [place]. Patient presented to the office on the day of admission with progressively worsening SOB, orthopnea, PND.  She was noted to be in AFib with RVR and she was  admitted for further evaluation and management.  She was placed on IV Heparin and underwent TEE guided DCCV on 02/17/14 with restoration of NSR.  Dr. Zoila Shutter reviewed her echo images and felt her EF was 40%.  She was placed on IV Lasix for diuresis and set up for inpatient Lexiscan Myoview.  This was done 02/19/14 and demonstrated no ischemia and an EF 55%.  CHADS2-VASc=6.  Heparin was changed over to Xarelto for stroke prophylaxis.  She did develop hypokalemia in the setting of IV diuresis.  Potassium was replaced.  She was transitioned over to oral Lasix and was continued on a maintainence dose of supplemental potassium. She  had no other issues. Her symptoms had resolved and she continued in NSR. She was last seen and examined by Dr. Graciela Husbands, who determined she was stable for discharge home. Post hospital f/u will be arranged either with Dr. Delton See or an APP.   Discharge Vitals:   Blood pressure 117/65, pulse 57, temperature 97 F (36.1 C), temperature source Oral, resp. rate 20, height 5\' 3"  (1.6 m), weight 166 lb 3.2 oz (75.388 kg), SpO2 99.00%.   Labs:   Recent Labs  02/18/14 0251 02/19/14 0259 02/20/14 0436  WBC 4.7 5.2 5.0  HGB 10.9* 11.6* 12.9  HCT 32.6* 33.8* 38.5  MCV 93.7 92.1 93.7  PLT 158 164 188     Recent Labs  02/18/14 1205 02/19/14 0259 02/19/14 1630 02/20/14 0436  NA 147 140  --  142  K 3.1* 3.2* 3.7 3.7  CL 110 102  --  100  CO2 25 23  --  25  BUN 12 14  --  11  CREATININE 0.97 0.92  --  0.94  CALCIUM 8.3* 8.5  --  9.3  PROT 5.7*  --   --   --   BILITOT 0.4  --   --   --   ALKPHOS 59  --   --   --   ALT 7  --   --   --   AST 9  --   --   --     Lab Results  Component Value Date   TSH 1.090 02/18/2014    No results found for this basename: INR,  in the last 72 hours   Diagnostic Procedures and Studies:   Nm Myocar Multi W/spect W/wall Motion / Ef   02/19/2014    IMPRESSION: No evidence for reversibility or myocardial ischemia.  Calculated ejection fraction is 55%.   Electronically Signed   By: Richarda Overlie M.D.   On: 02/19/2014 14:21     Disposition:   Pt is being discharged home today in good condition.  Follow-up Plans & Appointments      Follow-up Information   Follow up with Lars Masson, MD In 2 weeks. (office will call for appointment)    Specialty:  Cardiology   Contact information:   [address] [place] [address] [phone]       Discharge Medications    Medication List    STOP taking these medications       ARIPiprazole 10 MG tablet  Commonly known as:  ABILIFY     aspirin EC 81 MG tablet     clopidogrel  75 MG tablet  Commonly known as:  PLAVIX      TAKE these medications       atorvastatin 80 MG tablet  Commonly known as:  LIPITOR  Take 0.5 tablets (40 mg  total) by mouth daily.     DULoxetine 30 MG capsule  Commonly known as:  CYMBALTA  Take 2 capsules (60 mg total) by mouth 2 (two) times daily.     furosemide 40 MG tablet  Commonly known as:  LASIX  Take 1 tablet (40 mg total) by mouth 2 (two) times daily.     lamoTRIgine 100 MG tablet  Commonly known as:  LAMICTAL  Take 1 tablet (100 mg total) by mouth daily.     levothyroxine 125 MCG tablet  Commonly known as:  SYNTHROID, LEVOTHROID  Take 1 tablet (125 mcg total) by mouth daily.     lisinopril 20 MG tablet  Commonly known as:  PRINIVIL,ZESTRIL  Take 1 tablet (20 mg total) by mouth daily.     potassium chloride SA 20 MEQ tablet  Commonly known as:  K-DUR,KLOR-CON  Take 1 tablet (20 mEq total) by mouth 2 (two) times daily.     rivaroxaban 20 MG Tabs tablet  Commonly known as:  XARELTO  Take 1 tablet (20 mg total) by mouth daily with supper.     topiramate 50 MG tablet  Commonly known as:  TOPAMAX  Take 1 tablet (50 mg total) by mouth 2 (two) times daily. Start one tablet at night x 1 week then twice daily     traMADol 50 MG tablet  Commonly known as:  ULTRAM  Take 2 tablets (100 mg total) by mouth every 6 (six) hours as needed for moderate pain.         Duration of Discharge Encounter: Greater than 30 minutes including physician and PA time.  Signed, Tereso Newcomer, PA-C   02/20/2014 11:21 AM

## 2014-02-19 NOTE — Progress Notes (Signed)
Received call from RN regarding potential d/c. Patient is due for K+ ~ 5pm and needs a BMET after this to determine if K+ is normal. She is being placed on Xarelto. She is still on Lasix IV. Reviewed with Dr. Sherryl Manges by phone.   Will keep her in the hospital tonight.  Monitor K+.  Will ask CM to see tomorrow regarding cost feasibility of Xarelto. Change Lasix to po. Will plan on d/c tomorrow barring any changes in her condition. Tereso Newcomer, PA-C   02/19/2014 4:22 PM

## 2014-02-19 NOTE — Progress Notes (Signed)
ANTICOAGULATION CONSULT NOTE - Initial Consult  Pharmacy Consult for Xarelto Indication: atrial fibrillation  Allergies  Allergen Reactions  . Amoxicillin Hives  . Avelox [Moxifloxacin Hcl In Nacl] Other (See Comments)    Mental breakdown.  [name] Hydrocodone Itching  . Penicillins Hives  . Sulfa Antibiotics Other (See Comments)    unknown    Patient Measurements: Height: 5\' 3"  (160 cm) Weight: 173 lb 4.5 oz (78.6 kg) IBW/kg (Calculated) : 52.4 Vital Signs: Temp: 97.9 F (36.6 C) (06/13 1250) Temp src: Oral (06/13 1250) BP: 147/87 mmHg (06/13 1250) Pulse Rate: 68 (06/13 1250) Labs:  Recent Labs  02/16/14 1832  02/17/14 0050 02/17/14 0800 02/18/14 0251 02/18/14 1205 02/19/14 0259  HGB 12.0  --  11.5*  --  10.9*  --  11.6*  HCT 34.5*  --  33.7*  --  32.6*  --  33.8*  PLT 186  --  188  --  158  --  164  LABPROT 14.9  --   --   --   --   --   --   INR 1.20  --   --   --   --   --   --   HEPARINUNFRC  --   < > 0.33 0.36 0.31  --  0.29*  CREATININE  --   --   --   --   --  0.97 0.92  < > = values in this interval not displayed.  Estimated Creatinine Clearance: 67.8 ml/min (by C-G formula based on Cr of 0.92).  Medical History: Past Medical History  Diagnosis Date  . CAD (coronary artery disease)     cath 09/2010: LAD stent patent, S-Int/dCFX ok, S-PDA ok, L-LAD atretic  . Hypertension   . Hx of CABG   . Diastolic dysfunction     TEE 11/2010: EF 60-65%, BAE, trivial atrial septal shunt;  right heart cath in 10/2010 with elevated R and L heart pressures and diuretic started  . HLD (hyperlipidemia)   . Hypothyroidism   . Sleep apnea   . COPD (chronic obstructive pulmonary disease)   . Pulmonary nodules     repeat CT due in 11/2011  . Acute right MCA stroke 11/07/10  . 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 Eczema   . Depression    Medications:  Infusions:  . sodium chloride    . heparin 1,200 Units/hr (02/19/14 0423)    Assessment: 58 YOF on IV  heparin for atrial fibrillation now changing to Xarelto this PM. Britt Bottom (Pharmacy resident) is Nature conservation officer to stop heparin and begin Xarelto with evening meal.   Goal of Therapy:  Monitor platelets by anticoagulation protocol: Yes   Plan:  Stop heparin at 1700 PM when start Xarelto.  Xarelto 20mg  po daily with supper.   Link Snuffer, PharmD, BCPS Clinical Pharmacist [phone] 02/19/2014,3:49 PM

## 2014-02-19 NOTE — Progress Notes (Addendum)
Patient ID: [name], female   DOB: 1956/02/27, 58 y.o.   MRN: [id number]   Nuclear stress study shows normal LV function. There is no significant ischemia. With this finding, it is safe to start Xarelto as outlined by Dr. Graciela Husbands. I have ordered this. Heparin is being stopped.  Jerral Bonito, MD

## 2014-02-20 LAB — BASIC METABOLIC PANEL
BUN: 11 mg/dL (ref 6–23)
CALCIUM: 9.3 mg/dL (ref 8.4–10.5)
CHLORIDE: 100 meq/L (ref 96–112)
CO2: 25 meq/L (ref 19–32)
CREATININE: 0.94 mg/dL (ref 0.50–1.10)
GFR calc Af Amer: 77 mL/min — ABNORMAL LOW (ref >90)
GFR calc non Af Amer: 67 mL/min — ABNORMAL LOW (ref >90)
Glucose, Bld: 83 mg/dL (ref 70–99)
Potassium: 3.7 mEq/L (ref 3.7–5.3)
Sodium: 142 mEq/L (ref 137–147)

## 2014-02-20 LAB — CBC
HEMATOCRIT: 38.5 % (ref 36.0–46.0)
Hemoglobin: 12.9 g/dL (ref 12.0–15.0)
MCH: 31.4 pg (ref 26.0–34.0)
MCHC: 33.5 g/dL (ref 30.0–36.0)
MCV: 93.7 fL (ref 78.0–100.0)
Platelets: 188 10*3/uL (ref 150–400)
RBC: 4.11 MIL/uL (ref 3.87–5.11)
RDW: 12.9 % (ref 11.5–15.5)
WBC: 5 10*3/uL (ref 4.0–10.5)

## 2014-02-20 MED ORDER — ATORVASTATIN CALCIUM 80 MG PO TABS: 40.0000 mg | ORAL_TABLET | Freq: Every day | ORAL | Status: DC

## 2014-02-20 MED ORDER — LISINOPRIL 20 MG PO TABS: 20.0000 mg | ORAL_TABLET | Freq: Every day | ORAL | Status: DC

## 2014-02-20 MED ORDER — POTASSIUM CHLORIDE CRYS ER 20 MEQ PO TBCR: 20.0000 meq | EXTENDED_RELEASE_TABLET | Freq: Two times a day (BID) | ORAL | Status: DC

## 2014-02-20 MED ORDER — RIVAROXABAN 20 MG PO TABS: 20.0000 mg | ORAL_TABLET | Freq: Every day | ORAL | Status: DC

## 2014-02-20 MED ORDER — ZOLPIDEM TARTRATE 5 MG PO TABS
5.0000 mg | ORAL_TABLET | Freq: Every evening | ORAL | Status: DC | PRN
  Administered 2014-02-20: 5 mg via ORAL
  Filled 2014-02-20: qty 1

## 2014-02-20 MED ORDER — FUROSEMIDE 40 MG PO TABS: 40.0000 mg | ORAL_TABLET | Freq: Two times a day (BID) | ORAL | Status: DC

## 2014-02-20 NOTE — Care Management (Signed)
1125 6-0-14-15 CM did call [facility] and xarelto is available. Co pay will be 3.60. Pt has 30 day free card. Pt will need Rx for 30 day free card. No further needs from CM at this time. Gala Lewandowsky, RN,BSN [phone]

## 2014-02-20 NOTE — Progress Notes (Signed)
Patient Profile: 58 y/o female with h/o CAD, s/p CABG in 2008, HTN, HLD and stroke, admitted with newly identified AF RVR. EF mild depressed.   Subjective: Feels much better. Denies SOB, palpitations and chest pain.   Objective: Vital signs in last 24 hours: Temp:  [97.4 F (36.3 C)-98.4 F (36.9 C)] 97.8 F (36.6 C) (06/14 0500) Pulse Rate:  [57-77] 57 (06/13 1703) Resp:  [18-24] 20 (06/13 1042) BP: (103-147)/(61-94) 103/61 mmHg (06/14 0500) SpO2:  [95 %-98 %] 95 % (06/13 2338) Weight:  [166 lb 3.2 oz (75.388 kg)] 166 lb 3.2 oz (75.388 kg) (06/14 0500) Last BM Date: 02/14/14  Intake/Output from previous day: 06/13 0701 - 06/14 0700 In: 646 [P.O.:610; I.V.:36] Out: 800 [Urine:800] Intake/Output this shift:    Medications Current Facility-Administered Medications  Medication Dose Route Frequency Provider Last Rate Last Dose  . 0.9 %  sodium chloride infusion  250 mL Intravenous Continuous Lars Masson, MD      . acetaminophen (TYLENOL) tablet 650 mg  650 mg Oral Q6H PRN Lars Masson, MD   650 mg at 02/16/14 2113  . ARIPiprazole (ABILIFY) tablet 10 mg  10 mg Oral Daily Brittainy Simmons, PA-C   10 mg at 02/19/14 1229  . aspirin EC tablet 81 mg  81 mg Oral Daily Duke Salvia, MD   81 mg at 02/19/14 1229  . atorvastatin (LIPITOR) tablet 80 mg  80 mg Oral q1800 Chrystie Nose, MD   80 mg at 02/19/14 1743  . DULoxetine (CYMBALTA) DR capsule 60 mg  60 mg Oral BID Brittainy Simmons, PA-C   60 mg at 02/19/14 2144  . furosemide (LASIX) tablet 40 mg  40 mg Oral BID Beatrice Lecher, PA-C   40 mg at 02/19/14 1743  . lamoTRIgine (LAMICTAL) tablet 100 mg  100 mg Oral Daily Brittainy Simmons, PA-C   100 mg at 02/19/14 1229  . levothyroxine (SYNTHROID, LEVOTHROID) tablet 125 mcg  125 mcg Oral QAC breakfast Lars Masson, MD   125 mcg at 02/19/14 1228  . lisinopril (PRINIVIL,ZESTRIL) tablet 20 mg  20 mg Oral Daily Brittainy Simmons, PA-C   20 mg at 02/19/14 1228  . potassium  chloride SA (K-DUR,KLOR-CON) CR tablet 20 mEq  20 mEq Oral BID Beatrice Lecher, PA-C   20 mEq at 02/19/14 2144  . rivaroxaban (XARELTO) tablet 20 mg  20 mg Oral Q supper Gala Lewandowsky [place], RPH   20 mg at 02/19/14 1743  . sodium chloride 0.9 % injection 3 mL  3 mL Intravenous Q12H Lars Masson, MD   3 mL at 02/19/14 2145  . sodium chloride 0.9 % injection 3 mL  3 mL Intravenous Q12H Lars Masson, MD   3 mL at 02/19/14 2145  . sodium chloride 0.9 % injection 3 mL  3 mL Intravenous PRN Lars Masson, MD      . zolpidem (AMBIEN) tablet 5 mg  5 mg Oral QHS PRN Quintella Reichert, MD   5 mg at 02/20/14 0047    PE: General appearance: alert, cooperative and no distress Neck: no JVD Lungs: clear to auscultation bilaterally Heart: regular rate and rhythm Extremities: no LEE Pulses: 2+ and symmetric Skin: warm and dry Neurologic: Grossly normal  Lab Results:   Recent Labs  02/18/14 0251 02/19/14 0259 02/20/14 0436  WBC 4.7 5.2 5.0  HGB 10.9* 11.6* 12.9  HCT 32.6* 33.8* 38.5  PLT 158 164 188   BMET  Recent Labs  02/18/14 1205 02/19/14 0259 02/19/14 1630 02/20/14 0436  NA 147 140  --  142  K 3.1* 3.2* 3.7 3.7  CL 110 102  --  100  CO2 25 23  --  25  GLUCOSE 98 93  --  83  BUN 12 14  --  11  CREATININE 0.97 0.92  --  0.94  CALCIUM 8.3* 8.5  --  9.3   Studies/Results:  NST 02/19/13 FINDINGS: MYOCARDIAL IMAGING WITH SPECT (REST AND PHARMACOLOGIC-STRESS)  Myocardial perfusion is normal on the stress images. No evidence for reversibility or myocardial ischemia.  GATED LEFT VENTRICULAR WALL MOTION STUDY  Review of the gated images demonstrates normal wall motion.  LEFT VENTRICULAR EJECTION FRACTION  QGS ejection fraction measures 55% , with an end-diastolic volume of 91 ml and an end-systolic volume of 41 ml.  IMPRESSION: No evidence for reversibility or myocardial ischemia.  Calculated ejection fraction is 55%.     Assessment/Plan  Principal  Problem:   Atrial fibrillation with rapid ventricular response s/p TEE-DCCV (02/17/14) Active Problems:   HYPOTHYROIDISM   OBSTRUCTIVE SLEEP APNEA   Coronary artery disease s/p CABG and prior PCI to LAD   Acute combined systolic and diastolic heart failure   Cardiomyopathy with EF 40% at TEE 02/17/14 (likely tachycardia mediated - Myoview 02/19/14 neg for ischemia with normal EF)   Hyperlipidemia   Hypokalemia  1. Atrial Fibrillation: s/p successful TEE/ DCCV back to NSR. She continues in NS/SB with rates ~ 59-62. No BB/CCB at this time due to bradycardia. Will need to re-evaluate as an OP in 1-2 weeks. Continue Xarelto for stroke prophylaxis.    2. Chronic oral anticoagulation: on Xarelto. No signs of abnormal bleeding.   3. CAD: Denies CP. NST yesterday negative for ischemia. Continue ASA and statin. No BB at this time due to bradycardia.   4. Acute Combined Systolic + Diastolic CHF(EF 40%): status improved. Denies dyspnea. Appears euvolemic on physical exam. Continue PO Lasix + Potassium and ACE-I.   5. Hypokalemia: Resolved. Potassium was repleted. K is 3.7 today. Will need to continue daily supplemental K, in the setting of daily PO diuretic.   6. HTN: controlled.   7: HLD: Continue statin. She is on high dose Lipitor (80 mg). Last lipid panel was 11/16/13. LDL was not at goal, at 152 mg/dL. Goal in the setting of CAD is <70. Consider rechecking as an OP. If still not at goal, consider adding Zeita.    LOS: 4 days    Brittainy M. Delmer IslamSimmons, PA-C 02/20/2014 7:19 AM  Discharge to home

## 2014-03-03 ENCOUNTER — Ambulatory Visit (INDEPENDENT_AMBULATORY_CARE_PROVIDER_SITE_OTHER): Payer: Medicare Other | Admitting: Family Medicine

## 2014-03-03 ENCOUNTER — Encounter: Payer: Self-pay | Admitting: Physician Assistant

## 2014-03-03 ENCOUNTER — Other Ambulatory Visit: Payer: Self-pay

## 2014-03-03 ENCOUNTER — Inpatient Hospital Stay (HOSPITAL_COMMUNITY)
Admission: EM | Admit: 2014-03-03 | Discharge: 2014-03-06 | DRG: 309 | Disposition: A | Discharge status: Home Health | Payer: Medicare Other | Attending: Internal Medicine | Admitting: Internal Medicine

## 2014-03-03 ENCOUNTER — Encounter (HOSPITAL_COMMUNITY): Payer: Self-pay | Admitting: Emergency Medicine

## 2014-03-03 ENCOUNTER — Inpatient Hospital Stay (HOSPITAL_COMMUNITY): Payer: Medicare Other

## 2014-03-03 ENCOUNTER — Emergency Department (HOSPITAL_COMMUNITY): Payer: Medicare Other

## 2014-03-03 VITALS — BP 89/64 | HR 73 | Temp 97.5°F | Resp 18 | Ht 63.5 in | Wt 162.4 lb

## 2014-03-03 DIAGNOSIS — Z87891 Personal history of nicotine dependence: Secondary | ICD-10-CM | POA: Diagnosis not present

## 2014-03-03 DIAGNOSIS — I2510 Atherosclerotic heart disease of native coronary artery without angina pectoris: Secondary | ICD-10-CM | POA: Diagnosis not present

## 2014-03-03 DIAGNOSIS — I5022 Chronic systolic (congestive) heart failure: Secondary | ICD-10-CM | POA: Diagnosis present

## 2014-03-03 DIAGNOSIS — J45909 Unspecified asthma, uncomplicated: Secondary | ICD-10-CM

## 2014-03-03 DIAGNOSIS — I428 Other cardiomyopathies: Secondary | ICD-10-CM | POA: Diagnosis present

## 2014-03-03 DIAGNOSIS — R944 Abnormal results of kidney function studies: Secondary | ICD-10-CM | POA: Diagnosis not present

## 2014-03-03 DIAGNOSIS — R5381 Other malaise: Secondary | ICD-10-CM | POA: Diagnosis not present

## 2014-03-03 DIAGNOSIS — J42 Unspecified chronic bronchitis: Secondary | ICD-10-CM

## 2014-03-03 DIAGNOSIS — R5383 Other fatigue: Secondary | ICD-10-CM | POA: Diagnosis not present

## 2014-03-03 DIAGNOSIS — R300 Dysuria: Secondary | ICD-10-CM

## 2014-03-03 DIAGNOSIS — I4891 Unspecified atrial fibrillation: Secondary | ICD-10-CM | POA: Diagnosis not present

## 2014-03-03 DIAGNOSIS — Z833 Family history of diabetes mellitus: Secondary | ICD-10-CM

## 2014-03-03 DIAGNOSIS — E039 Hypothyroidism, unspecified: Secondary | ICD-10-CM

## 2014-03-03 DIAGNOSIS — J4489 Other specified chronic obstructive pulmonary disease: Secondary | ICD-10-CM | POA: Diagnosis present

## 2014-03-03 DIAGNOSIS — Z951 Presence of aortocoronary bypass graft: Secondary | ICD-10-CM | POA: Diagnosis not present

## 2014-03-03 DIAGNOSIS — N390 Urinary tract infection, site not specified: Secondary | ICD-10-CM

## 2014-03-03 DIAGNOSIS — Z8614 Personal history of Methicillin resistant Staphylococcus aureus infection: Secondary | ICD-10-CM | POA: Diagnosis not present

## 2014-03-03 DIAGNOSIS — Z7901 Long term (current) use of anticoagulants: Secondary | ICD-10-CM

## 2014-03-03 DIAGNOSIS — R404 Transient alteration of awareness: Secondary | ICD-10-CM | POA: Diagnosis not present

## 2014-03-03 DIAGNOSIS — Z9861 Coronary angioplasty status: Secondary | ICD-10-CM

## 2014-03-03 DIAGNOSIS — I509 Heart failure, unspecified: Secondary | ICD-10-CM | POA: Diagnosis present

## 2014-03-03 DIAGNOSIS — I5032 Chronic diastolic (congestive) heart failure: Secondary | ICD-10-CM | POA: Diagnosis not present

## 2014-03-03 DIAGNOSIS — Z8249 Family history of ischemic heart disease and other diseases of the circulatory system: Secondary | ICD-10-CM

## 2014-03-03 DIAGNOSIS — F329 Major depressive disorder, single episode, unspecified: Secondary | ICD-10-CM | POA: Diagnosis present

## 2014-03-03 DIAGNOSIS — I272 Pulmonary hypertension, unspecified: Secondary | ICD-10-CM | POA: Insufficient documentation

## 2014-03-03 DIAGNOSIS — E785 Hyperlipidemia, unspecified: Secondary | ICD-10-CM | POA: Diagnosis present

## 2014-03-03 DIAGNOSIS — I959 Hypotension, unspecified: Secondary | ICD-10-CM

## 2014-03-03 DIAGNOSIS — R071 Chest pain on breathing: Secondary | ICD-10-CM | POA: Diagnosis present

## 2014-03-03 DIAGNOSIS — Z5900 Homelessness unspecified: Secondary | ICD-10-CM

## 2014-03-03 DIAGNOSIS — I10 Essential (primary) hypertension: Secondary | ICD-10-CM | POA: Diagnosis present

## 2014-03-03 DIAGNOSIS — J449 Chronic obstructive pulmonary disease, unspecified: Secondary | ICD-10-CM | POA: Diagnosis present

## 2014-03-03 DIAGNOSIS — Z880 Allergy status to penicillin: Secondary | ICD-10-CM

## 2014-03-03 DIAGNOSIS — F3289 Other specified depressive episodes: Secondary | ICD-10-CM | POA: Diagnosis present

## 2014-03-03 DIAGNOSIS — T502X5A Adverse effect of carbonic-anhydrase inhibitors, benzothiadiazides and other diuretics, initial encounter: Secondary | ICD-10-CM | POA: Diagnosis present

## 2014-03-03 DIAGNOSIS — I429 Cardiomyopathy, unspecified: Secondary | ICD-10-CM

## 2014-03-03 DIAGNOSIS — R918 Other nonspecific abnormal finding of lung field: Secondary | ICD-10-CM

## 2014-03-03 DIAGNOSIS — N179 Acute kidney failure, unspecified: Secondary | ICD-10-CM

## 2014-03-03 DIAGNOSIS — Z8673 Personal history of transient ischemic attack (TIA), and cerebral infarction without residual deficits: Secondary | ICD-10-CM

## 2014-03-03 DIAGNOSIS — A498 Other bacterial infections of unspecified site: Secondary | ICD-10-CM | POA: Diagnosis present

## 2014-03-03 DIAGNOSIS — R309 Painful micturition, unspecified: Secondary | ICD-10-CM

## 2014-03-03 DIAGNOSIS — I952 Hypotension due to drugs: Secondary | ICD-10-CM

## 2014-03-03 DIAGNOSIS — G4730 Sleep apnea, unspecified: Secondary | ICD-10-CM | POA: Diagnosis present

## 2014-03-03 DIAGNOSIS — R0602 Shortness of breath: Secondary | ICD-10-CM | POA: Diagnosis not present

## 2014-03-03 DIAGNOSIS — G4733 Obstructive sleep apnea (adult) (pediatric): Secondary | ICD-10-CM

## 2014-03-03 LAB — I-STAT CHEM 8, ED
BUN: 39 mg/dL — ABNORMAL HIGH (ref 6–23)
Calcium, Ion: 1.17 mmol/L (ref 1.12–1.23)
Chloride: 103 mEq/L (ref 96–112)
Creatinine, Ser: 2.9 mg/dL — ABNORMAL HIGH (ref 0.50–1.10)
Glucose, Bld: 78 mg/dL (ref 70–99)
HEMATOCRIT: 43 % (ref 36.0–46.0)
Hemoglobin: 14.6 g/dL (ref 12.0–15.0)
Potassium: 4.4 mEq/L (ref 3.7–5.3)
Sodium: 138 mEq/L (ref 137–147)
TCO2: 20 mmol/L (ref 0–100)

## 2014-03-03 LAB — CBC WITH DIFFERENTIAL/PLATELET
BASOS ABS: 0 10*3/uL (ref 0.0–0.1)
BASOS PCT: 1 % (ref 0–1)
EOS PCT: 3 % (ref 0–5)
Eosinophils Absolute: 0.1 10*3/uL (ref 0.0–0.7)
HEMATOCRIT: 40.5 % (ref 36.0–46.0)
Hemoglobin: 14 g/dL (ref 12.0–15.0)
Lymphocytes Relative: 24 % (ref 12–46)
Lymphs Abs: 1 10*3/uL (ref 0.7–4.0)
MCH: 31.8 pg (ref 26.0–34.0)
MCHC: 34.6 g/dL (ref 30.0–36.0)
MCV: 92 fL (ref 78.0–100.0)
MONO ABS: 0.4 10*3/uL (ref 0.1–1.0)
Monocytes Relative: 10 % (ref 3–12)
Neutro Abs: 2.7 10*3/uL (ref 1.7–7.7)
Neutrophils Relative %: 62 % (ref 43–77)
Platelets: 136 10*3/uL — ABNORMAL LOW (ref 150–400)
RBC: 4.4 MIL/uL (ref 3.87–5.11)
RDW: 12 % (ref 11.5–15.5)
WBC: 4.4 10*3/uL (ref 4.0–10.5)

## 2014-03-03 LAB — URINALYSIS, ROUTINE W REFLEX MICROSCOPIC
Bilirubin Urine: NEGATIVE
Glucose, UA: NEGATIVE mg/dL
KETONES UR: NEGATIVE mg/dL
NITRITE: POSITIVE — AB
PH: 5 (ref 5.0–8.0)
Protein, ur: NEGATIVE mg/dL
Specific Gravity, Urine: 1.013 (ref 1.005–1.030)
Urobilinogen, UA: 0.2 mg/dL (ref 0.0–1.0)

## 2014-03-03 LAB — COMPREHENSIVE METABOLIC PANEL
ALT: 9 U/L (ref 0–35)
AST: 11 U/L (ref 0–37)
Albumin: 3.8 g/dL (ref 3.5–5.2)
Alkaline Phosphatase: 72 U/L (ref 39–117)
BUN: 43 mg/dL — ABNORMAL HIGH (ref 6–23)
CALCIUM: 8.9 mg/dL (ref 8.4–10.5)
CO2: 21 mEq/L (ref 19–32)
Chloride: 101 mEq/L (ref 96–112)
Creatinine, Ser: 2.76 mg/dL — ABNORMAL HIGH (ref 0.50–1.10)
GFR calc non Af Amer: 18 mL/min — ABNORMAL LOW (ref >90)
GFR, EST AFRICAN AMERICAN: 21 mL/min — AB (ref >90)
GLUCOSE: 77 mg/dL (ref 70–99)
Potassium: 4.5 mEq/L (ref 3.7–5.3)
SODIUM: 139 meq/L (ref 137–147)
Total Bilirubin: 0.4 mg/dL (ref 0.3–1.2)
Total Protein: 6.8 g/dL (ref 6.0–8.3)

## 2014-03-03 LAB — I-STAT TROPONIN, ED: Troponin i, poc: 0.03 ng/mL (ref 0.00–0.08)

## 2014-03-03 LAB — URINE MICROSCOPIC-ADD ON

## 2014-03-03 LAB — HEPARIN LEVEL (UNFRACTIONATED): HEPARIN UNFRACTIONATED: 0.28 [IU]/mL — AB (ref 0.30–0.70)

## 2014-03-03 LAB — TSH: TSH: 1.52 u[IU]/mL (ref 0.350–4.500)

## 2014-03-03 LAB — APTT: aPTT: 31 seconds (ref 24–37)

## 2014-03-03 LAB — PRO B NATRIURETIC PEPTIDE: Pro B Natriuretic peptide (BNP): 644.7 pg/mL — ABNORMAL HIGH (ref 0–125)

## 2014-03-03 MED ORDER — ACETAMINOPHEN 325 MG PO TABS
650.0000 mg | ORAL_TABLET | Freq: Four times a day (QID) | ORAL | Status: DC | PRN
  Administered 2014-03-03 – 2014-03-04 (×2): 650 mg via ORAL
  Filled 2014-03-03 (×3): qty 2

## 2014-03-03 MED ORDER — ACETAMINOPHEN 650 MG RE SUPP: 650.0000 mg | Freq: Four times a day (QID) | RECTAL | Status: DC | PRN

## 2014-03-03 MED ORDER — SODIUM CHLORIDE 0.9 % IJ SOLN
3.0000 mL | Freq: Two times a day (BID) | INTRAMUSCULAR | Status: DC
  Administered 2014-03-03 – 2014-03-06 (×6): 3 mL via INTRAVENOUS

## 2014-03-03 MED ORDER — DILTIAZEM HCL 25 MG/5ML IV SOLN
10.0000 mg | Freq: Once | INTRAVENOUS | Status: AC
  Administered 2014-03-03: 5 mg via INTRAVENOUS
  Filled 2014-03-03: qty 5

## 2014-03-03 MED ORDER — LAMOTRIGINE 100 MG PO TABS
100.0000 mg | ORAL_TABLET | Freq: Every day | ORAL | Status: DC
  Administered 2014-03-04 – 2014-03-06 (×3): 100 mg via ORAL
  Filled 2014-03-03 (×3): qty 1

## 2014-03-03 MED ORDER — ARIPIPRAZOLE 10 MG PO TABS
10.0000 mg | ORAL_TABLET | Freq: Every day | ORAL | Status: DC
  Administered 2014-03-03 – 2014-03-06 (×4): 10 mg via ORAL
  Filled 2014-03-03 (×4): qty 1

## 2014-03-03 MED ORDER — RIVAROXABAN 20 MG PO TABS: 20.0000 mg | ORAL_TABLET | Freq: Every day | ORAL | Status: DC

## 2014-03-03 MED ORDER — DULOXETINE HCL 60 MG PO CPEP
60.0000 mg | ORAL_CAPSULE | Freq: Two times a day (BID) | ORAL | Status: DC
  Administered 2014-03-03 – 2014-03-06 (×6): 60 mg via ORAL
  Filled 2014-03-03 (×7): qty 1

## 2014-03-03 MED ORDER — ATORVASTATIN CALCIUM 40 MG PO TABS
40.0000 mg | ORAL_TABLET | Freq: Every day | ORAL | Status: DC
  Administered 2014-03-03 – 2014-03-06 (×4): 40 mg via ORAL
  Filled 2014-03-03 (×4): qty 1

## 2014-03-03 MED ORDER — TOPIRAMATE 25 MG PO TABS
50.0000 mg | ORAL_TABLET | Freq: Two times a day (BID) | ORAL | Status: DC
Start: 2014-03-03 — End: 2014-03-06
  Administered 2014-03-03 – 2014-03-06 (×6): 50 mg via ORAL
  Filled 2014-03-03 (×7): qty 2

## 2014-03-03 MED ORDER — METOPROLOL TARTRATE 12.5 MG HALF TABLET
12.5000 mg | ORAL_TABLET | Freq: Two times a day (BID) | ORAL | Status: DC
  Administered 2014-03-04 (×2): 12.5 mg via ORAL
  Filled 2014-03-03 (×4): qty 1

## 2014-03-03 MED ORDER — SODIUM CHLORIDE 0.9 % IV SOLN: INTRAVENOUS | Status: DC

## 2014-03-03 MED ORDER — LEVOTHYROXINE SODIUM 125 MCG PO TABS
125.0000 ug | ORAL_TABLET | Freq: Every day | ORAL | Status: DC
  Administered 2014-03-04 – 2014-03-06 (×3): 125 ug via ORAL
  Filled 2014-03-03 (×4): qty 1

## 2014-03-03 MED ORDER — TRAMADOL HCL 50 MG PO TABS
50.0000 mg | ORAL_TABLET | Freq: Once | ORAL | Status: AC
  Administered 2014-03-04: 50 mg via ORAL
  Filled 2014-03-03: qty 1

## 2014-03-03 MED ORDER — DILTIAZEM HCL 100 MG IV SOLR
5.0000 mg/h | INTRAVENOUS | Status: DC
  Administered 2014-03-03: 5 mg/h via INTRAVENOUS

## 2014-03-03 MED ORDER — IPRATROPIUM-ALBUTEROL 0.5-2.5 (3) MG/3ML IN SOLN: 3.0000 mL | Freq: Four times a day (QID) | RESPIRATORY_TRACT | Status: DC | PRN

## 2014-03-03 MED ORDER — SODIUM CHLORIDE 0.9 % IV BOLUS (SEPSIS)
1000.0000 mL | Freq: Once | INTRAVENOUS | Status: AC
  Administered 2014-03-03: 1000 mL via INTRAVENOUS

## 2014-03-03 MED ORDER — ONDANSETRON HCL 4 MG PO TABS: 4.0000 mg | ORAL_TABLET | Freq: Four times a day (QID) | ORAL | Status: DC | PRN

## 2014-03-03 MED ORDER — SODIUM CHLORIDE 0.9 % IV BOLUS (SEPSIS): 250.0000 mL | INTRAVENOUS | Status: DC | PRN

## 2014-03-03 MED ORDER — HEPARIN BOLUS VIA INFUSION
3000.0000 [IU] | Freq: Once | INTRAVENOUS | Status: AC
  Administered 2014-03-03: 3000 [IU] via INTRAVENOUS
  Filled 2014-03-03: qty 3000

## 2014-03-03 MED ORDER — HEPARIN (PORCINE) IN NACL 100-0.45 UNIT/ML-% IJ SOLN
700.0000 [IU]/h | INTRAMUSCULAR | Status: AC
  Administered 2014-03-03: 800 [IU]/h via INTRAVENOUS
  Administered 2014-03-04 – 2014-03-05 (×2): 700 [IU]/h via INTRAVENOUS
  Filled 2014-03-03 (×3): qty 250

## 2014-03-03 MED ORDER — AMIODARONE HCL 200 MG PO TABS
400.0000 mg | ORAL_TABLET | Freq: Two times a day (BID) | ORAL | Status: DC
  Administered 2014-03-03 – 2014-03-06 (×6): 400 mg via ORAL
  Filled 2014-03-03 (×7): qty 2

## 2014-03-03 MED ORDER — ONDANSETRON HCL 4 MG/2ML IJ SOLN: 4.0000 mg | Freq: Four times a day (QID) | INTRAMUSCULAR | Status: DC | PRN

## 2014-03-03 NOTE — ED Notes (Addendum)
Pt reports to the ED for eval of atrial fibrillation with RVR. Pts HR 120-170s. Pt was admitted a few weeks ago and dx with A. Fib. She went to an [facility] for an unrelated complaint (cyst on scalp) and they found her HR to be in the 120s-170s. Pt complaining of generalized weakness, dizziness, and SOB. Pt has hx of chronic chest discomfort. Pt hypotensive en route at 90s systolic. Pt was given 2 L of O2 via nasal cannula for subjective sensation of SOB and pt reports this reduced her SOB. Pt 12 lead shows A. Fib RVR. A&Ox4, resp e/u, and skin warm and dry.

## 2014-03-03 NOTE — Progress Notes (Signed)
Pt states she has been feeling dizzy, weak and fatigued for the past two days. She states her urination frequency has increased since 02/20/14 - hospitalization and has become painful in the last two days. She states without having any energy she keeps falling to sleep very easily and does not realize she was drowsy until she wakes up.  PMHx:  CAD, s/p CVA with left facial weakness ROS:  Positive for lightheadness, shortness of breath, chronic chest wall pain (unchanged); dysuria x 2 days Negative for edema, abdominal pain, nausea, headache, loss of consciousness, new skin lesions  She was recently discharged after converting from A.Fib while in hospital.  She had presented with a low potassium and this seemed to be the etiology of the a.fib.  She has been taking her potassium right along.  Objective:  NAD but patient appears weak and unsteady HEENT: NAD Neck:  Supple, no JVD at 30 degrees elevation, no adenopathy or thyromegaly Chest:  Clear Heart:  Irreg, irreg.  I/VI systolic murmur Abdomen:  Soft, nontender, no HSM or mass Ext: livedo changes on thighs, no edema, 1 cm cyst on scalp (parietal) Wide-based gait Neuro:  Left facial weakness  Assessment:  Recurrent A.Fib; unstable with low BP and dizziness  Plan:  To hospital via EMT for further evaluation and treatment  Elvina Sidle, MD

## 2014-03-03 NOTE — Progress Notes (Signed)
ANTICOAGULATION CONSULT NOTE - Initial Consult  Pharmacy Consult for Xarelto to Heparin (renal insufficiency) Indication: atrial fibrillation  Allergies  Allergen Reactions  . Amoxicillin Hives  . Avelox [Moxifloxacin Hcl In Nacl] Other (See Comments)    Mental breakdown.  [name] Hydrocodone Itching  . Penicillins Hives  . Sulfa Antibiotics Other (See Comments)    unknown    Patient Measurements: Height: 5' 3.5" (161.3 cm) Weight: 167 lb 1.7 oz (75.8 kg) IBW/kg (Calculated) : 53.55  Vital Signs: Temp: 97.9 F (36.6 C) (06/25 2016) Temp src: Oral (06/25 2016) BP: 101/74 mmHg (06/25 2016) Pulse Rate: 83 (06/25 2016)  Labs:  Recent Labs  03/03/14 1539 03/03/14 1547  HGB 14.0 14.6  HCT 40.5 43.0  PLT 136*  --   CREATININE 2.76* 2.90*    Estimated Creatinine Clearance: 21.4 ml/min (by C-G formula based on Cr of 2.9).   Medical History: Past Medical History  Diagnosis Date  . CAD (coronary artery disease)     a.  cath 09/2010: LAD stent patent, S-Int/dCFX ok, S-PDA ok, L-LAD atretic;  b. Lexiscan Myoview (02/2014):  no ischemia, EF 55%   . Hypertension   . Hx of CABG   . Hx of transesophageal echocardiography (TEE) for monitoring 11/2010    TEE 11/2010: EF 60-65%, BAE, trivial atrial septal shunt;  right heart cath in 10/2010 with elevated R and L heart pressures and diuretic started  . HLD (hyperlipidemia)   . Hypothyroidism   . Sleep apnea   . COPD (chronic obstructive pulmonary disease)   . Pulmonary nodules     repeat CT due in 11/2011  . Acute right MCA stroke 11/07/10  . 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 Eczema   . Depression   . Atrial fibrillation     a. s/p TEE-DCCV 02/2104; b. Xarelto started  . Cardiomyopathy with EF 40% at TEE 02/17/14 (likely tachycardia mediated - Myoview 02/19/14 neg for ischemia with normal EF) 02/18/2014    Assessment: 58 year old female on Xarelto PTA for Afib.  Now with renal insufficiency -- to transition to  heparin until renal function recovers  Goal of Therapy:  Heparin level 0.3-0.7 units/ml PTT = 66 to 102 seconds Monitor platelets by anticoagulation protocol: Yes   Plan:  1) Obtain baseline PTT / heparin 2) Further heparin dosing based on labs  Thank you. Okey Regal, PharmD [phone]  Elwin Sleight 03/03/2014,9:02 PM

## 2014-03-03 NOTE — ED Provider Notes (Addendum)
CSN: [id number]     Arrival date & time 03/03/14  1449 History   First MD Initiated Contact with Patient 03/03/14 1506     Chief Complaint  Patient presents with  . Atrial Fibrillation     (Consider location/radiation/quality/duration/timing/severity/associated sxs/prior Treatment) Patient is a 58 y.o. female presenting with atrial fibrillation. The history is provided by the patient.  Atrial Fibrillation This is a recurrent (pt went to urgent care today to get a cyst removed and found to be in a.fib rvr) problem. The current episode started 2 days ago. The problem occurs constantly. The problem has not changed since onset.Associated symptoms comments: DOE, lightheaded, fatigue.  Chronic chest wall pain that pt states is no different.  Dry cough without fever, swelling or abd pain or vomiting.. The symptoms are aggravated by walking and standing. The symptoms are relieved by rest. She has tried rest for the symptoms. The treatment provided no relief.    Past Medical History  Diagnosis Date  . CAD (coronary artery disease)     a.  cath 09/2010: LAD stent patent, S-Int/dCFX ok, S-PDA ok, L-LAD atretic;  b. Lexiscan Myoview (02/2014):  no ischemia, EF 55%   . Hypertension   . Hx of CABG   . Hx of transesophageal echocardiography (TEE) for monitoring 11/2010    TEE 11/2010: EF 60-65%, BAE, trivial atrial septal shunt;  right heart cath in 10/2010 with elevated R and L heart pressures and diuretic started  . HLD (hyperlipidemia)   . Hypothyroidism   . Sleep apnea   . COPD (chronic obstructive pulmonary disease)   . Pulmonary nodules     repeat CT due in 11/2011  . Acute right MCA stroke 11/07/10  . 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 Eczema   . Depression   . Atrial fibrillation     a. s/p TEE-DCCV 02/2104; b. Xarelto started  . Cardiomyopathy with EF 40% at TEE 02/17/14 (likely tachycardia mediated - Myoview 02/19/14 neg for ischemia with normal EF) 02/18/2014   Past  Surgical History  Procedure Laterality Date  . Coronary artery bypass graft    . Debriment for infection in chest    . Chest wall reconstruction    . Bilateral knee surgery    . Bladder surgery    . Left mastoidectomy    . Hernia repair    . Cath 2012    . Tee without cardioversion N/A 02/17/2014    Procedure: TRANSESOPHAGEAL ECHOCARDIOGRAM (TEE);  Surgeon: Pricilla RifflePaula Ross V, MD;  [facility];  Service: Cardiovascular;  Laterality: N/A;  . Cardioversion N/A 02/17/2014    Procedure: CARDIOVERSION;  Surgeon: Pricilla RifflePaula Ross V, MD;  [facility];  Service: Cardiovascular;  Laterality: N/A;   Family History  Problem Relation Age of Onset  . COPD Mother   . Heart disease Mother   . Arthritis Mother     Rheumatoid and PMR  . Heart attack Father   . Depression Father   . Hypertension Father   . Osteoporosis Mother     Mom fractured hip  . Diabetes type II Mother    History  Substance Use Topics  . Smoking status: Former Smoker -- 0.25 packs/day for 30 years    Types: Cigarettes    Quit date: 02/14/2014  . Smokeless tobacco: Never Used  . Alcohol Use: No   OB History   Grav Para Term Preterm Abortions TAB SAB Ect Mult Living  Review of Systems  All other systems reviewed and are negative.     Allergies  Amoxicillin; Avelox; Hydrocodone; Penicillins; and Sulfa antibiotics  Home Medications   Prior to Admission medications   Medication Sig Start Date End Date Taking? Authorizing Jerene Yeager  atorvastatin (LIPITOR) 80 MG tablet Take 0.5 tablets (40 mg total) by mouth daily. 02/20/14   Brittainy Simmons, PA-C  DULoxetine (CYMBALTA) 30 MG capsule Take 2 capsules (60 mg total) by mouth 2 (two) times daily. 11/16/13   Elvina Sidle, MD  furosemide (LASIX) 40 MG tablet Take 1 tablet (40 mg total) by mouth 2 (two) times daily. 02/20/14   Brittainy Simmons, PA-C  lamoTRIgine (LAMICTAL) 100 MG tablet Take 1 tablet (100 mg total) by mouth daily. 11/16/13   Elvina Sidle, MD  levothyroxine (SYNTHROID, LEVOTHROID) 125 MCG tablet Take 1 tablet (125 mcg total) by mouth daily. 11/16/13   Elvina Sidle, MD  lisinopril (PRINIVIL,ZESTRIL) 20 MG tablet Take 1 tablet (20 mg total) by mouth daily. 02/20/14   Brittainy Simmons, PA-C  potassium chloride SA (K-DUR,KLOR-CON) 20 MEQ tablet Take 1 tablet (20 mEq total) by mouth 2 (two) times daily. 02/20/14   Brittainy Simmons, PA-C  rivaroxaban (XARELTO) 20 MG TABS tablet Take 1 tablet (20 mg total) by mouth daily with supper. 02/20/14   Brittainy Simmons, PA-C  topiramate (TOPAMAX) 50 MG tablet Take 1 tablet (50 mg total) by mouth 2 (two) times daily. Start one tablet at night x 1 week then twice daily 01/03/14   Micki Riley, MD  traMADol (ULTRAM) 50 MG tablet Take 2 tablets (100 mg total) by mouth every 6 (six) hours as needed for moderate pain. 01/03/14   Micki Riley, MD   BP 81/53  Pulse 126  Temp(Src) 97.2 F (36.2 C) (Oral)  Resp 14  SpO2 99% Physical Exam  Nursing note and vitals reviewed. Constitutional: She is oriented to person, place, and time. She appears well-developed and well-nourished. No distress.  HENT:  Head: Normocephalic and atraumatic.  Mouth/Throat: Oropharynx is clear and moist.  Eyes: Conjunctivae and EOM are normal. Pupils are equal, round, and reactive to light.  Neck: Normal range of motion. Neck supple.  Cardiovascular: Intact distal pulses.  An irregularly irregular rhythm present. Tachycardia present.   No murmur heard. Pulses palpable in all ext  Pulmonary/Chest: Effort normal and breath sounds normal. No respiratory distress. She has no wheezes. She has no rales. She exhibits no tenderness.  Abdominal: Soft. She exhibits no distension. There is no tenderness. There is no rebound and no guarding.  Musculoskeletal: Normal range of motion. She exhibits no edema and no tenderness.  Neurological: She is alert and oriented to person, place, and time.  Skin: Skin is warm and dry.  No rash noted. No erythema.  Feet are pale but <2sec cap refill  Psychiatric: She has a normal mood and affect. Her behavior is normal.    ED Course  Procedures (including critical care time) Labs Review Labs Reviewed  CBC WITH DIFFERENTIAL - Abnormal; Notable for the following:    Platelets 136 (*)    All other components within normal limits  COMPREHENSIVE METABOLIC PANEL - Abnormal; Notable for the following:    BUN 43 (*)    Creatinine, Ser 2.76 (*)    GFR calc non Af Amer 18 (*)    GFR calc Af Amer 21 (*)    All other components within normal limits  PRO B NATRIURETIC PEPTIDE - Abnormal; Notable for the following:  Pro B Natriuretic peptide (BNP) 644.7 (*)    All other components within normal limits  I-STAT CHEM 8, ED - Abnormal; Notable for the following:    BUN 39 (*)    Creatinine, Ser 2.90 (*)    All other components within normal limits  URINALYSIS, ROUTINE W REFLEX MICROSCOPIC  I-STAT TROPOININ, ED    Imaging Review Dg Chest Port 1 View  03/03/2014   CLINICAL DATA:  Shortness breath. Cough. Chest wall pain near sternum. Infection post CABG with removal of portion of the sternum filled with muscle from arm. Hypertension.  EXAM: PORTABLE CHEST - 1 VIEW  COMPARISON:  08/10/2011  FINDINGS: Post CABG.  Cardiomegaly.  Central pulmonary vascular prominence without pulmonary edema.  Surgical clips project over the lateral aspect the right upper lobe.  No segmental consolidation or gross pneumothorax.  Limited evaluation of the sternum by portable chest examination. If there were a clinical suspicion of abnormality at this level, CT imaging would be necessary for further delineation.  IMPRESSION: Post CABG.  Cardiomegaly.  Central pulmonary vascular prominence without pulmonary edema.  No segmental consolidation.  Limited evaluation of the sternum by portable chest examination. If there were a clinical suspicion of abnormality at this level, CT imaging would be necessary for  further delineation.   Electronically Signed   By: Bridgett Larsson M.D.   On: 03/03/2014 15:56     Date: 03/03/2014  Rate: 121  Rhythm: atrial fibrillation  QRS Axis: normal  Intervals: normal  ST/T Wave abnormalities: nonspecific ST/T changes  Conduction Disutrbances:none  Narrative Interpretation:   Old EKG Reviewed: changes noted recurrent a.fib. Last EKG showed sinus bradycardia    MDM   Final diagnoses:  Atrial fibrillation with RVR  Acute renal failure, unspecified acute renal failure type  Hypotension, unspecified hypotension type    Pt with recent a.fib rvr with TEE and cardioversion currently on xarelto.  Presents today with 2 days of weakness, fatigue, DOE and found to be in a.fib RVR at urgent care.  Pt here has HR from 110-140 and BP in the high 80's.  She is mentating normally and perfusing.  No signs of fluid overload on exam.  Pt did take her lisinopril today. Pt's EKG consistent with a.fib RVR.  She denies any recent infectious sx, stressors or new chest pain.  She get chronic chest wall pain all the time but states no different.   Will start with cardizem bolus and gtt for HR control.  Looking at recent hospitalization pt's BP ranged from 90's-120's.  CBC, CMP, BNP, trop, CXR pending.  4:47 PM No signs of fluid overload at this time. However on testing patient can't have new renal failure with a creatinine of 2.76 from .93.  HR controlled in the 90's and BP seems to be the same in the high 80's and low 90's.  Discussed with cardiology who will consult on the pt.  Will have [facility] admit  CRITICAL CARE Performed by: Gwyneth Sprout Total critical care time: 30 Critical care time was exclusive of separately billable procedures and treating other patients. Critical care was necessary to treat or prevent imminent or life-threatening deterioration. Critical care was time spent personally by me on the following activities: development of treatment plan with patient and/or  surrogate as well as nursing, discussions with consultants, evaluation of patient's response to treatment, examination of patient, obtaining history from patient or surrogate, ordering and performing treatments and interventions, ordering and review of laboratory studies, ordering and review of radiographic  studies, pulse oximetry and re-evaluation of patient's condition.   Gwyneth Sprout, MD 03/03/14 1655  Gwyneth Sprout, MD 03/03/14 1709  Gwyneth Sprout, MD 03/03/14 1733  Gwyneth Sprout, MD 03/03/14 1733

## 2014-03-03 NOTE — H&P (Signed)
[facility] History and Physical  [name] UQJ:[id number] DOB: 1956-01-07 DOA: 03/03/2014  Referring physician:  PCP: [name], MD  Specialists:   Chief Complaint: dizziness, palpitations   HPI: [name] is a 58 y.o. female with PMH of HTN, CAD s/p CABG, CHF, COPD, h/o CVA, recently admitted by cardiology for PAF s/p DCCV (6/11)  Presented with SOB, dizziness, palpitations, found a fib RVR, and AKI (new); Pt reports being dizzy for several days, associated with generalized weakness, occasional palpitations; denies chest pain, no PND, or orthopnea, no LE edema; Pt reports stopping smoking tobacco but still have intermittent cough, nonproductive, no fever, chills, no nausea, vomiting or diarrhea    Review of Systems: The patient denies anorexia, fever, weight loss,, vision loss, decreased hearing, hoarseness, chest pain, syncope, dyspnea on exertion, peripheral edema, balance deficits, hemoptysis, abdominal pain, melena, hematochezia, severe indigestion/heartburn, hematuria, incontinence, genital sores, muscle weakness, suspicious skin lesions, transient blindness, difficulty walking, depression, unusual weight change, abnormal bleeding, enlarged lymph nodes, angioedema, and breast masses.    Past Medical History  Diagnosis Date  . CAD (coronary artery disease)     a.  cath 09/2010: LAD stent patent, S-Int/dCFX ok, S-PDA ok, L-LAD atretic;  b. Lexiscan Myoview (02/2014):  no ischemia, EF 55%   . Hypertension   . Hx of CABG   . Hx of transesophageal echocardiography (TEE) for monitoring 11/2010    TEE 11/2010: EF 60-65%, BAE, trivial atrial septal shunt;  right heart cath in 10/2010 with elevated R and L heart pressures and diuretic started  . HLD (hyperlipidemia)   . Hypothyroidism   . Sleep apnea   . COPD (chronic obstructive pulmonary disease)   . Pulmonary nodules     repeat CT due in 11/2011  . Acute right MCA stroke 11/07/10  . Hx MRSA infection     Chest wall  syndrome post CABG  . Eczema   . Depression   . Atrial fibrillation     a. s/p TEE-DCCV 02/2104; b. Xarelto started  . Cardiomyopathy with EF 40% at TEE 02/17/14 (likely tachycardia mediated - Myoview 02/19/14 neg for ischemia with normal EF) 02/18/2014   Past Surgical History  Procedure Laterality Date  . Coronary artery bypass graft    . Debriment for infection in chest    . Chest wall reconstruction    . Bilateral knee surgery    . Bladder surgery    . Left mastoidectomy    . Hernia repair    . Cath 2012    . Tee without cardioversion N/A 02/17/2014    Procedure: TRANSESOPHAGEAL ECHOCARDIOGRAM (TEE);  Surgeon: Pricilla Riffle, MD;  Location[facility] ENDOSCOPY;  Service: Cardiovascular;  Laterality: N/A;  . Cardioversion N/A 02/17/2014    Procedure: CARDIOVERSION;  Surgeon: Pricilla Riffle, MD;  Location: [facility] ENDOSCOPY;  Service: Cardiovascular;  Laterality: N/A;   Social History:  reports that she quit smoking about 2 weeks ago. Her smoking use included Cigarettes. She has a 7.5 pack-year smoking history. She has never used smokeless tobacco. She reports that she does not drink alcohol or use illicit drugs. [place];  where does patient live--home, ALF, SNF? and with whom if at home? Yes;  Can patient participate in ADLs?  Allergies  Allergen Reactions  . Amoxicillin Hives  . Avelox [Moxifloxacin Hcl In Nacl] Other (See Comments)    Mental breakdown.  [name] Hydrocodone Itching  . Penicillins Hives  . Sulfa Antibiotics Other (See Comments)    unknown  Family History  Problem Relation Age of Onset  . COPD Mother   . Heart disease Mother   . Arthritis Mother     Rheumatoid and PMR  . Heart attack Father   . Depression Father   . Hypertension Father   . Osteoporosis Mother     Mom fractured hip  . Diabetes type II Mother     (be sure to complete)  Prior to Admission medications   Medication Sig Start Date End Date Taking? Authorizing Provider  ARIPiprazole (ABILIFY) 10 MG tablet Take 10  mg by mouth daily.   Yes Historical Provider, MD  atorvastatin (LIPITOR) 80 MG tablet Take 0.5 tablets (40 mg total) by mouth daily. 02/20/14  Yes Brittainy Sharol Harness, PA-C  DULoxetine (CYMBALTA) 30 MG capsule Take 2 capsules (60 mg total) by mouth 2 (two) times daily. 11/16/13  Yes Elvina Sidle, MD  furosemide (LASIX) 40 MG tablet Take 1 tablet (40 mg total) by mouth 2 (two) times daily. 02/20/14  Yes Brittainy Sharol Harness, PA-C  lamoTRIgine (LAMICTAL) 100 MG tablet Take 1 tablet (100 mg total) by mouth daily. 11/16/13  Yes Elvina Sidle, MD  levothyroxine (SYNTHROID, LEVOTHROID) 125 MCG tablet Take 1 tablet (125 mcg total) by mouth daily. 11/16/13  Yes Elvina Sidle, MD  lisinopril (PRINIVIL,ZESTRIL) 20 MG tablet Take 1 tablet (20 mg total) by mouth daily. 02/20/14  Yes Brittainy Simmons, PA-C  potassium chloride SA (K-DUR,KLOR-CON) 20 MEQ tablet Take 1 tablet (20 mEq total) by mouth 2 (two) times daily. 02/20/14  Yes Brittainy Sharol Harness, PA-C  rivaroxaban (XARELTO) 20 MG TABS tablet Take 1 tablet (20 mg total) by mouth daily with supper. 02/20/14  Yes Brittainy Sharol Harness, PA-C  topiramate (TOPAMAX) 50 MG tablet Take 1 tablet (50 mg total) by mouth 2 (two) times daily. Start one tablet at night x 1 week then twice daily 01/03/14  Yes Micki Riley, MD  traMADol (ULTRAM) 50 MG tablet Take 2 tablets (100 mg total) by mouth every 6 (six) hours as needed for moderate pain. 01/03/14  Yes Micki Riley, MD   Physical Exam: Filed Vitals:   03/03/14 1715  BP: 98/43  Pulse: 92  Temp:   Resp: 21     General:  alert  Eyes: eom-i  ENT: no oral ulcers   Neck: supple, no JVD  Cardiovascular: s1,s2 tachy, irregular   Respiratory: poor ventilation in LL  Abdomen: soft, nt,nd   Skin: some ecchymosis   Musculoskeletal: no LE edeam  Psychiatric: no hallucinations   Neurologic: CN 2-12 intact; motor 5/5 BL symmetric   Labs on Admission:  Basic Metabolic Panel:  Recent Labs Lab 03/03/14 1539  03/03/14 1547  NA 139 138  K 4.5 4.4  CL 101 103  CO2 21  --   GLUCOSE 77 78  BUN 43* 39*  CREATININE 2.76* 2.90*  CALCIUM 8.9  --    Liver Function Tests:  Recent Labs Lab 03/03/14 1539  AST 11  ALT 9  ALKPHOS 72  BILITOT 0.4  PROT 6.8  ALBUMIN 3.8   No results found for this basename: LIPASE, AMYLASE,  in the last 168 hours No results found for this basename: AMMONIA,  in the last 168 hours CBC:  Recent Labs Lab 03/03/14 1539 03/03/14 1547  WBC 4.4  --   NEUTROABS 2.7  --   HGB 14.0 14.6  HCT 40.5 43.0  MCV 92.0  --   PLT 136*  --    Cardiac Enzymes: No results found for  this basename: CKTOTAL, CKMB, CKMBINDEX, TROPONINI,  in the last 168 hours  BNP (last 3 results)  Recent Labs  02/18/14 1205 03/03/14 1539  PROBNP 1629.0* 644.7*   CBG: No results found for this basename: GLUCAP,  in the last 168 hours  Radiological Exams on Admission: Dg Chest Port 1 View  03/03/2014   CLINICAL DATA:  Shortness breath. Cough. Chest wall pain near sternum. Infection post CABG with removal of portion of the sternum filled with muscle from arm. Hypertension.  EXAM: PORTABLE CHEST - 1 VIEW  COMPARISON:  08/10/2011  FINDINGS: Post CABG.  Cardiomegaly.  Central pulmonary vascular prominence without pulmonary edema.  Surgical clips project over the lateral aspect the right upper lobe.  No segmental consolidation or gross pneumothorax.  Limited evaluation of the sternum by portable chest examination. If there were a clinical suspicion of abnormality at this level, CT imaging would be necessary for further delineation.  IMPRESSION: Post CABG.  Cardiomegaly.  Central pulmonary vascular prominence without pulmonary edema.  No segmental consolidation.  Limited evaluation of the sternum by portable chest examination. If there were a clinical suspicion of abnormality at this level, CT imaging would be necessary for further delineation.   Electronically Signed   By: Bridgett LarssonSteve  Olson M.D.   On:  03/03/2014 15:56    EKG: Independently reviewed.   Assessment/Plan Principal Problem:   Atrial fibrillation with RVR Active Problems:   AKI (acute kidney injury)   Hypotension   58 y.o. female with PMH of HTN, CAD s/p CABG, CHF, COPD, h/o CVA, recently admitted by cardiology for PAF s/p DCCV (6/11)  Presented with SOB, dizziness, palpitations, found in a fib RVR, and AKI (new);  1. A fib RVR; HR improving on IV Cardizem started in ED; but BP is low -monitor in SDU, cont IV cardizem titrate per HR/BP; IVF; cont xarelto;  Check tsh;  -s/p recent DCCV (6/11); cardiology following   2. AKI likely ATN/hypovolemia on diuretics, on top of ACE -obtain UA, renal US, cont IVF, close monitor volume status due to chronic CHF, I/O, urine output  -hold diuretics, ACE 3. Chronic systolic CHF; clinically euvolemic;  -cont gentle IVF for renal failure; monitor fluid status closely; hold ACE; -resume lasix as needed clinical/lab evaluation  4. CAD s/p CABG; denies chest pain; start BB(transition from cardizem) cont statin;  5. Hypothyroidism, recheck tsh; cont levothyroxine  6. COPD, Pt reports stopping smoking this month; no wheezing on exam; cont inhaler prn   Cardiology;  if consultant consulted, please document name and whether formally or informally consulted  Code Status: full (must indicate code status--if unknown or must be presumed, indicate so) Family Communication:  D/w patient  (indicate person spoken with, if applicable, with phone number if by telephone) Disposition Plan: home pend clinical improvement  (indicate anticipated LOS)  Time spent: >35 minutes   Esperanza SheetsBURIEV, ULUGBEK N [facility] Pager [phone]  If 7PM-7AM, please contact night-coverage [email or web address] Password [email or web address] 03/03/2014, 5:23 PM

## 2014-03-03 NOTE — Consult Note (Signed)
Reason for Consult: rapid a fib   Referring Physician:  Dr. Daleen Bo PCP: Lottie Dawson, MD Primary Cardiologist: Dr. Ena Dawley  [name] is an 58 y.o. female.    Chief Complaint: dizzy and palpitations   HPI:   Asked to see this patient for evaluation of recurrent atrial fibrillation. She has a complicated cardiac history with prior coronary artery disease with previous bypass grafting complicated by mediastinitis with extensive sternal debridement to repair it and has left her with chronic chest pain. She also has known left hemiplegia do to a prior CVA in February 2012 with recurrent TIAs.  She was recently admitted with atrial fibrillation and had a TEE cardioversion. Following the cardioversion she had some bradycardia and was not discharged on any AV nodal blocking agents. She does have a prior history of bradycardia in the past going back to 2012. She has a difficult social situation in addition. She was discharged on XARELTO and was doing fairly well until she developed recurrent shortness of breath yesterday and felt poorly overnight. She kept an appointment have a cyst removed from her scalp and she was in urgent care today and was noted to be in rapid atrial fibrillation and was sent to the emergency room here. She was found to be in acute renal failure having been on lisinopril and furosemide. She was on intravenous diltiazem and her rate is currently 80 and she has been mildly hypotensive. She currently has some usual chest wall pain.  Past Medical History  Diagnosis Date  . CAD (coronary artery disease)     a.  cath 09/2010: LAD stent patent, S-Int/dCFX ok, S-PDA ok, L-LAD atretic;  b. Lexiscan Myoview (02/2014):  no ischemia, EF 55%   . Hypertension   . Hx of CABG   . Hx of transesophageal echocardiography (TEE) for monitoring 11/2010    TEE 11/2010: EF 60-65%, BAE, trivial atrial septal shunt;  right heart cath in 10/2010 with elevated R and L heart  pressures and diuretic started  . HLD (hyperlipidemia)   . Hypothyroidism   . Sleep apnea   . COPD (chronic obstructive pulmonary disease)   . Pulmonary nodules     repeat CT due in 11/2011  . Acute right MCA stroke 11/07/10  . 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 Eczema   . Depression   . Atrial fibrillation     a. s/p TEE-DCCV 02/2104; b. Xarelto started  . Cardiomyopathy with EF 40% at TEE 02/17/14 (likely tachycardia mediated - Myoview 02/19/14 neg for ischemia with normal EF) 02/18/2014    Past Surgical History  Procedure Laterality Date  . Coronary artery bypass graft    . Debriment for infection in chest    . Chest wall reconstruction    . Bilateral knee surgery    . Bladder surgery    . Left mastoidectomy    . Hernia repair    . Cath 2012    . Tee without cardioversion N/A 02/17/2014    Procedure: TRANSESOPHAGEAL ECHOCARDIOGRAM (TEE);  Surgeon: Fay Records, MD;  Location: [facility] ENDOSCOPY;  Service: Cardiovascular;  Laterality: N/A;  . Cardioversion N/A 02/17/2014    Procedure: CARDIOVERSION;  Surgeon: Fay Records, MD;  Location: [facility] ENDOSCOPY;  Service: Cardiovascular;  Laterality: N/A;    Family History  Problem Relation Age of Onset  . COPD Mother   . Heart disease Mother   . Arthritis Mother  Rheumatoid and PMR  . Heart attack Father   . Depression Father   . Hypertension Father   . Osteoporosis Mother     Mom fractured hip  . Diabetes type II Mother    Social History:  reports that she quit smoking about 2 weeks ago. Her smoking use included Cigarettes. She has a 7.5 pack-year smoking history. She has never used smokeless tobacco. She reports that she does not drink alcohol or use illicit drugs.  Allergies:  Allergies  Allergen Reactions  . Amoxicillin Hives  . Avelox [Moxifloxacin Hcl In Nacl] Other (See Comments)    Mental breakdown.  [name] Hydrocodone Itching  . Penicillins Hives  . Sulfa Antibiotics Other (See Comments)    unknown   No  current facility-administered medications on file prior to encounter.   Current Outpatient Prescriptions on File Prior to Encounter  Medication Sig Dispense Refill  . atorvastatin (LIPITOR) 80 MG tablet Take 0.5 tablets (40 mg total) by mouth daily.  30 tablet  11  . DULoxetine (CYMBALTA) 30 MG capsule Take 2 capsules (60 mg total) by mouth 2 (two) times daily.  60 capsule  11  . furosemide (LASIX) 40 MG tablet Take 1 tablet (40 mg total) by mouth 2 (two) times daily.  60 tablet  5  . lamoTRIgine (LAMICTAL) 100 MG tablet Take 1 tablet (100 mg total) by mouth daily.  30 tablet  11  . levothyroxine (SYNTHROID, LEVOTHROID) 125 MCG tablet Take 1 tablet (125 mcg total) by mouth daily.  30 tablet  11  . lisinopril (PRINIVIL,ZESTRIL) 20 MG tablet Take 1 tablet (20 mg total) by mouth daily.  30 tablet  5  . potassium chloride SA (K-DUR,KLOR-CON) 20 MEQ tablet Take 1 tablet (20 mEq total) by mouth 2 (two) times daily.  60 tablet  5  . rivaroxaban (XARELTO) 20 MG TABS tablet Take 1 tablet (20 mg total) by mouth daily with supper.  30 tablet  10  . topiramate (TOPAMAX) 50 MG tablet Take 1 tablet (50 mg total) by mouth 2 (two) times daily. Start one tablet at night x 1 week then twice daily  100 tablet  1  . traMADol (ULTRAM) 50 MG tablet Take 2 tablets (100 mg total) by mouth every 6 (six) hours as needed for moderate pain.  30 tablet  1     Results for orders placed during the hospital encounter of 03/03/14 (from the past 48 hour(s))  CBC WITH DIFFERENTIAL     Status: Abnormal   Collection Time    03/03/14  3:39 PM      Result Value Ref Range   WBC 4.4  4.0 - 10.5 K/uL   RBC 4.40  3.87 - 5.11 MIL/uL   Hemoglobin 14.0  12.0 - 15.0 g/dL   HCT 40.5  36.0 - 46.0 %   MCV 92.0  78.0 - 100.0 fL   MCH 31.8  26.0 - 34.0 pg   MCHC 34.6  30.0 - 36.0 g/dL   RDW 12.0  11.5 - 15.5 %   Platelets 136 (*) 150 - 400 K/uL   Neutrophils Relative % 62  43 - 77 %   Neutro Abs 2.7  1.7 - 7.7 K/uL   Lymphocytes  Relative 24  12 - 46 %   Lymphs Abs 1.0  0.7 - 4.0 K/uL   Monocytes Relative 10  3 - 12 %   Monocytes Absolute 0.4  0.1 - 1.0 K/uL   Eosinophils Relative 3  0 -  5 %   Eosinophils Absolute 0.1  0.0 - 0.7 K/uL   Basophils Relative 1  0 - 1 %   Basophils Absolute 0.0  0.0 - 0.1 K/uL  COMPREHENSIVE METABOLIC PANEL     Status: Abnormal   Collection Time    03/03/14  3:39 PM      Result Value Ref Range   Sodium 139  137 - 147 mEq/L   Potassium 4.5  3.7 - 5.3 mEq/L   Chloride 101  96 - 112 mEq/L   CO2 21  19 - 32 mEq/L   Glucose, Bld 77  70 - 99 mg/dL   BUN 43 (*) 6 - 23 mg/dL   Creatinine, Ser 2.76 (*) 0.50 - 1.10 mg/dL   Calcium 8.9  8.4 - 10.5 mg/dL   Total Protein 6.8  6.0 - 8.3 g/dL   Albumin 3.8  3.5 - 5.2 g/dL   AST 11  0 - 37 U/L   ALT 9  0 - 35 U/L   Alkaline Phosphatase 72  39 - 117 U/L   Total Bilirubin 0.4  0.3 - 1.2 mg/dL   GFR calc non Af Amer 18 (*) >90 mL/min   GFR calc Af Amer 21 (*) >90 mL/min   Comment: (NOTE)     The eGFR has been calculated using the CKD EPI equation.     This calculation has not been validated in all clinical situations.     eGFR's persistently <90 mL/min signify possible Chronic Kidney     Disease.  PRO B NATRIURETIC PEPTIDE     Status: Abnormal   Collection Time    03/03/14  3:39 PM      Result Value Ref Range   Pro B Natriuretic peptide (BNP) 644.7 (*) 0 - 125 pg/mL  I-STAT CHEM 8, ED     Status: Abnormal   Collection Time    03/03/14  3:47 PM      Result Value Ref Range   Sodium 138  137 - 147 mEq/L   Potassium 4.4  3.7 - 5.3 mEq/L   Chloride 103  96 - 112 mEq/L   BUN 39 (*) 6 - 23 mg/dL   Creatinine, Ser 2.90 (*) 0.50 - 1.10 mg/dL   Glucose, Bld 78  70 - 99 mg/dL   Calcium, Ion 1.17  1.12 - 1.23 mmol/L   TCO2 20  0 - 100 mmol/L   Hemoglobin 14.6  12.0 - 15.0 g/dL   HCT 43.0  36.0 - 46.0 %  I-STAT TROPOININ, ED     Status: None   Collection Time    03/03/14  3:47 PM      Result Value Ref Range   Troponin i, poc 0.03  0.00 -  0.08 ng/mL   Comment 3            Comment: Due to the release kinetics of cTnI,     a negative result within the first hours     of the onset of symptoms does not rule out     myocardial infarction with certainty.     If myocardial infarction is still suspected,     repeat the test at appropriate intervals.   Dg Chest Port 1 View  03/03/2014   CLINICAL DATA:  Shortness breath. Cough. Chest wall pain near sternum. Infection post CABG with removal of portion of the sternum filled with muscle from arm. Hypertension.  EXAM: PORTABLE CHEST - 1 VIEW  COMPARISON:  08/10/2011  FINDINGS: Post  CABG.  Cardiomegaly.  Central pulmonary vascular prominence without pulmonary edema.  Surgical clips project over the lateral aspect the right upper lobe.  No segmental consolidation or gross pneumothorax.  Limited evaluation of the sternum by portable chest examination. If there were a clinical suspicion of abnormality at this level, CT imaging would be necessary for further delineation.  IMPRESSION: Post CABG.  Cardiomegaly.  Central pulmonary vascular prominence without pulmonary edema.  No segmental consolidation.  Limited evaluation of the sternum by portable chest examination. If there were a clinical suspicion of abnormality at this level, CT imaging would be necessary for further delineation.   Electronically Signed   By: Chauncey Cruel M.D.   On: 03/03/2014 15:56    ROS: She has had a productive cough and has had some mild shortness of breath. She has had intermittent dizziness and has had some weakness on her left side. She has a cyst involving her scalp. She has chest wall tenderness. Other than as noted above the remainder of the review of systems is unremarkable.   Blood pressure 97/64, pulse 54, temperature 97.2 F (36.2 C), temperature source Oral, resp. rate 19, SpO2 92.00%. PE: General:Pleasant affect but flat, NAD Skin:Warm and dry, brisk capillary refill HEENT:normocephalic, sclera clear, mucus  membranes moist Neck:supple, no JVD, no bruits  Heart:S1S2 irreg irreg without murmur, gallup, rub or click Lungs:clear without rales, rhonchi, or wheezes OXB:DZHG, non tender, + BS, do not palpate liver spleen or masses Ext:no lower ext edema, 2+ pedal pulses, 2+ radial pulses Neuro:alert and oriented, MAE, follows commands, + facial symmetry    Assessment/Plan  1. Recurrent atrial fibrillation with rapid response 2. Recent cardiomyopathy thought to be tachycardia mediated 3. Coronary artery disease with previous bypass grafting previous PCI to the LAD 4. Chronic diastolic dysfunction with elevated pulmonary pressures previously 5. 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 History of stroke and TIAs  7. COPD 8. History bradycardia going back several years 9. Acute renal failure  Recommendations:  She currently is controlled and would discontinue diltiazem at the present time. Go ahead and start oral amiodarone. She may not be a candidate for the novel oral anticoagulants if she has fluctuating renal status. Cover with heparin for the time being. May need an early cardioversion if she does not convert on amiodarone. I would  holdher Lasix at the time and stop her lisinopril and follow renal function.   Kerry Hough MD [facility]

## 2014-03-03 NOTE — ED Notes (Signed)
Hospitalist at the bedside assessing patient

## 2014-03-03 NOTE — Patient Instructions (Signed)
Pt states she has been feeling dizzy, weak and fatigued for the past two days. She states her urination frequency has increased since 02/20/14 - hospitalization and has become painful in the last two days. She states without having any energy she keeps falling to sleep very easily and does not realize she was drowsy until she wakes up.  She was recently discharged after converting from A.Fib while in hospital.  She had presented with a low potassium and this seemed to be the etiology of the a.fib  Objective:  NAD but patient appears weak and unsteady HEENT: NAD Neck:  Supple, no JVD at 30 degrees elevation, no adenopathy or thyromegaly Chest:  Clear Heart:  Irreg, irreg.  I/VI systolic murmur Abdomen:  Soft, nontender, no HSM or mass Ext: livedo changes on thighs, no edema, 1 cm cyst on scalp (parietal) Wide-based gait  Assessment:  Recurrent A.Fib; unstable with low BP and dizziness  Plan:  To hospital via EMT for further evaluation and treatment  Elvina Sidle, MD

## 2014-03-03 NOTE — ED Notes (Signed)
Pt wants to wait 20 minutes while she is getting the NS bolus before urinating.

## 2014-03-04 DIAGNOSIS — I5032 Chronic diastolic (congestive) heart failure: Secondary | ICD-10-CM | POA: Diagnosis not present

## 2014-03-04 DIAGNOSIS — I4891 Unspecified atrial fibrillation: Principal | ICD-10-CM

## 2014-03-04 DIAGNOSIS — N179 Acute kidney failure, unspecified: Secondary | ICD-10-CM

## 2014-03-04 LAB — CBC
HCT: 36.8 % (ref 36.0–46.0)
Hemoglobin: 12.9 g/dL (ref 12.0–15.0)
MCH: 32.1 pg (ref 26.0–34.0)
MCHC: 35.1 g/dL (ref 30.0–36.0)
MCV: 91.5 fL (ref 78.0–100.0)
PLATELETS: 126 10*3/uL — AB (ref 150–400)
RBC: 4.02 MIL/uL (ref 3.87–5.11)
RDW: 12.1 % (ref 11.5–15.5)
WBC: 5 10*3/uL (ref 4.0–10.5)

## 2014-03-04 LAB — BASIC METABOLIC PANEL
BUN: 36 mg/dL — AB (ref 6–23)
CHLORIDE: 105 meq/L (ref 96–112)
CO2: 18 meq/L — AB (ref 19–32)
Calcium: 8.3 mg/dL — ABNORMAL LOW (ref 8.4–10.5)
Creatinine, Ser: 1.96 mg/dL — ABNORMAL HIGH (ref 0.50–1.10)
GFR calc Af Amer: 32 mL/min — ABNORMAL LOW (ref >90)
GFR calc non Af Amer: 27 mL/min — ABNORMAL LOW (ref >90)
Glucose, Bld: 87 mg/dL (ref 70–99)
Potassium: 4 mEq/L (ref 3.7–5.3)
Sodium: 139 mEq/L (ref 137–147)

## 2014-03-04 LAB — APTT
aPTT: 110 seconds — ABNORMAL HIGH (ref 24–37)
aPTT: 73 seconds — ABNORMAL HIGH (ref 24–37)

## 2014-03-04 LAB — HEPARIN LEVEL (UNFRACTIONATED): Heparin Unfractionated: 0.44 IU/mL (ref 0.30–0.70)

## 2014-03-04 MED ORDER — SODIUM CHLORIDE 0.9 % IV SOLN
INTRAVENOUS | Status: DC
  Administered 2014-03-04 – 2014-03-05 (×4): via INTRAVENOUS

## 2014-03-04 MED ORDER — TRAMADOL HCL 50 MG PO TABS
50.0000 mg | ORAL_TABLET | Freq: Two times a day (BID) | ORAL | Status: DC | PRN
  Administered 2014-03-04: 50 mg via ORAL
  Filled 2014-03-04: qty 1

## 2014-03-04 MED ORDER — LEVOFLOXACIN 250 MG PO TABS
250.0000 mg | ORAL_TABLET | Freq: Every day | ORAL | Status: DC
  Administered 2014-03-04 – 2014-03-06 (×3): 250 mg via ORAL
  Filled 2014-03-04 (×3): qty 1

## 2014-03-04 NOTE — Care Management Note (Signed)
    Page 1 of 1   03/04/2014     8:36:13 AM CARE MANAGEMENT NOTE 03/04/2014  Patient:  KAYLIANA, ACKERLEY   Account Number:  [id number]  Date Initiated:  03/04/2014  Documentation initiated by:  Junius Creamer  Subjective/Objective Assessment:   adm w at fib     Action/Plan:   lives w roommates, pcpd r wanda panash   Anticipated DC Date:     Anticipated DC Plan:           Choice offered to / List presented to:             Status of service:   Medicare Important Message given?   (If response is "NO", the following Medicare IM given date fields will be blank) Date Medicare IM given:   Date Additional Medicare IM given:    Discharge Disposition:    Per UR Regulation:  Reviewed for med. necessity/level of care/duration of stay  If discussed at Long Length of Stay Meetings, dates discussed:    Comments:

## 2014-03-04 NOTE — Progress Notes (Signed)
ANTICOAGULATION CONSULT NOTE - Follow -Up Consult  Pharmacy Consult for Xarelto to Heparin (renal insufficiency) Indication: atrial fibrillation  Allergies  Allergen Reactions  . Amoxicillin Hives  . Avelox [Moxifloxacin Hcl In Nacl] Other (See Comments)    Mental breakdown.  [name] Hydrocodone Itching  . Penicillins Hives  . Sulfa Antibiotics Other (See Comments)    unknown    Patient Measurements: Height: 5' 3.5" (161.3 cm) Weight: 167 lb 1.7 oz (75.8 kg) IBW/kg (Calculated) : 53.55  Vital Signs: Temp: 97.3 F (36.3 C) (06/26 1635) Temp src: Oral (06/26 1635) BP: 79/54 mmHg (06/26 1641)  Labs:  Recent Labs  03/03/14 1539 03/03/14 1547 03/03/14 2147 03/04/14 0642 03/04/14 0737 03/04/14 1555  HGB 14.0 14.6  --  12.9  --   --   HCT 40.5 43.0  --  36.8  --   --   PLT 136*  --   --  126*  --   --   APTT  --   --  31 110*  --  73*  HEPARINUNFRC  --   --  0.28*  --   --  0.44  CREATININE 2.76* 2.90*  --   --  1.96*  --     Estimated Creatinine Clearance: 31.6 ml/min (by C-G formula based on Cr of 1.96).   Medical History: Past Medical History  Diagnosis Date  . CAD (coronary artery disease)     a.  cath 09/2010: LAD stent patent, S-Int/dCFX ok, S-PDA ok, L-LAD atretic;  b. Lexiscan Myoview (02/2014):  no ischemia, EF 55%   . Hypertension   . Hx of CABG   . Hx of transesophageal echocardiography (TEE) for monitoring 11/2010    TEE 11/2010: EF 60-65%, BAE, trivial atrial septal shunt;  right heart cath in 10/2010 with elevated R and L heart pressures and diuretic started  . HLD (hyperlipidemia)   . Hypothyroidism   . Sleep apnea   . COPD (chronic obstructive pulmonary disease)   . Pulmonary nodules     repeat CT due in 11/2011  . Acute right MCA stroke 11/07/10  . 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 Eczema   . Depression   . Atrial fibrillation     a. s/p TEE-DCCV 02/2104; b. Xarelto started  . Cardiomyopathy with EF 40% at TEE 02/17/14 (likely  tachycardia mediated - Myoview 02/19/14 neg for ischemia with normal EF) 02/18/2014    Assessment: 58 year old female on Xarelto PTA for Afib.  Now with renal insufficiency -- to transition to heparin until renal function recovers. APTT and heparin level now at goal on IV heparin 700 units/hr.  No bleeding or complications noted.  Interestingly, last Xarelto dose reportedly taken 6/24 PTA, but heparin level was not elevated on admission.  Goal of Therapy:  Heparin level 0.3-0.7 units/ml PTT = 66 to 102 seconds Monitor platelets by anticoagulation protocol: Yes   Plan:  1) Continue heparin at current rate. 2) Will d/c PTT monitoring, and just follow heparin levels. 3) Daily heparin level and CBC.  Tad Moore, BCPS  Clinical Pharmacist Pager [phone]  03/04/2014 4:46 PM

## 2014-03-04 NOTE — Progress Notes (Signed)
[facility] PROGRESS NOTE  Laural BenesKathi G Harbeson EXB:[id number] DOB: Oct 01, 1955 DOA: 03/03/2014 PCP: Lorretta HarpPANOSH,[name], MD  Assessment/Plan: Principal Problem:  Atrial fibrillation with RVR  Active Problems:  AKI (acute kidney injury)  Hypotension   58 y.o. female with PMH of HTN, CAD s/p CABG, CHF, COPD, h/o CVA, recently admitted by cardiology for PAF s/p DCCV (6/11) Presented with SOB, dizziness, palpitations, found in a fib RVR, and AKI, UTI  1. A fib RVR; off Cardizem per cardiology; hemodynamically labile BP -monitor in SDU, xarelto on hold due to AKI, on IV heparin; on PO amio per cardiology, BB; appreciate the input  -s/p recent DCCV (6/11);  cardiology following  2. AKI likely ATN/hypovolemia on diuretics, on top of ACE  -renal NU:UVOZDGS:Normal ultrasound appearance of the kidneys and bladder, UA + infection  -pend BMP; cont gentle IVF, close monitor volume status due to chronic CHF, I/O, urine output  -hold diuretics, ACE  3. Chronic systolic CHF;  -cont gentle IVF for renal failure; monitor fluid status closely; hold ACE;  -resume lasix as needed clinical/lab evaluation  4. CAD s/p CABG; denies chest pain; cont statin, added BB  5. Hypothyroidism, recheck tsh-1.5; cont levothyroxine  6. COPD, Pt reports stopping smoking this month; no wheezing on exam; cont inhaler prn  7. UTI, start levofloxacin; (Pt reports rash to amoxicillin, but anxiety, irritation to levofloxacin) -try levofloxacin; monitor   Cardiology; if consultant consulted, please document name and whether formally or informally consulted   Code Status: full (must indicate code status--if unknown or must be presumed, indicate so)  Family Communication: D/w patient she updated her son, called to update her son at [phone] no answer. Try later  (indicate person spoken with, if applicable, with phone number if by telephone)  Disposition Plan: home pend clinical improvement (indicate anticipated  LOS)   Consultants:  cardiology   Procedures:  Pend echo   Antibiotics:  levofloxacin (indicate start date, and stop date if known)  HPI/Subjective: alert  Objective: Filed Vitals:   03/04/14 0700  BP: 94/68  Pulse:   Temp:   Resp: 26    Intake/Output Summary (Last 24 hours) at 03/04/14 0809 Last data filed at 03/04/14 0600  Gross per 24 hour  Intake 2298.67 ml  Output    710 ml  Net 1588.67 ml   Filed Weights   03/03/14 1815  Weight: 75.8 kg (167 lb 1.7 oz)    Exam:   General:  alert  Cardiovascular: s1,s2 rrr  Respiratory: few crackles in LL  Abdomen: soft, nt,nd   Musculoskeletal: pedal mild edema    Data Reviewed: Basic Metabolic Panel:  Recent Labs Lab 03/03/14 1539 03/03/14 1547  NA 139 138  K 4.5 4.4  CL 101 103  CO2 21  --   GLUCOSE 77 78  BUN 43* 39*  CREATININE 2.76* 2.90*  CALCIUM 8.9  --    Liver Function Tests:  Recent Labs Lab 03/03/14 1539  AST 11  ALT 9  ALKPHOS 72  BILITOT 0.4  PROT 6.8  ALBUMIN 3.8   No results found for this basename: LIPASE, AMYLASE,  in the last 168 hours No results found for this basename: AMMONIA,  in the last 168 hours CBC:  Recent Labs Lab 03/03/14 1539 03/03/14 1547 03/04/14 0642  WBC 4.4  --  5.0  NEUTROABS 2.7  --   --   HGB 14.0 14.6 12.9  HCT 40.5 43.0 36.8  MCV 92.0  --  91.5  PLT 136*  --  126*   Cardiac Enzymes: No results found for this basename: CKTOTAL, CKMB, CKMBINDEX, TROPONINI,  in the last 168 hours BNP (last 3 results)  Recent Labs  02/18/14 1205 03/03/14 1539  PROBNP 1629.0* 644.7*   CBG: No results found for this basename: GLUCAP,  in the last 168 hours  No results found for this or any previous visit (from the past 240 hour(s)).   Studies: US Renal  03/04/2014   CLINICAL DATA:  Increasing creatinine.  EXAM: RENAL/URINARY TRACT ULTRASOUND COMPLETE  COMPARISON:  CT abdomen and pelvis 04/03/2011  FINDINGS: Right Kidney:  Length: 9.3 cm.  Echogenicity within normal limits. No mass or hydronephrosis visualized.  Left Kidney:  Length: 9.6 cm. Echogenicity within normal limits. No mass or hydronephrosis visualized.  Bladder:  Appears normal for degree of bladder distention. Prevoid volume is measured at 122 ml.  IMPRESSION: Normal ultrasound appearance of the kidneys and bladder. No evidence of obstruction.   Electronically Signed   By: Burman Nieves M.D.   On: 03/04/2014 02:40   Dg Chest Port 1 View  03/03/2014   CLINICAL DATA:  Shortness breath. Cough. Chest wall pain near sternum. Infection post CABG with removal of portion of the sternum filled with muscle from arm. Hypertension.  EXAM: PORTABLE CHEST - 1 VIEW  COMPARISON:  08/10/2011  FINDINGS: Post CABG.  Cardiomegaly.  Central pulmonary vascular prominence without pulmonary edema.  Surgical clips project over the lateral aspect the right upper lobe.  No segmental consolidation or gross pneumothorax.  Limited evaluation of the sternum by portable chest examination. If there were a clinical suspicion of abnormality at this level, CT imaging would be necessary for further delineation.  IMPRESSION: Post CABG.  Cardiomegaly.  Central pulmonary vascular prominence without pulmonary edema.  No segmental consolidation.  Limited evaluation of the sternum by portable chest examination. If there were a clinical suspicion of abnormality at this level, CT imaging would be necessary for further delineation.   Electronically Signed   By: Bridgett Larsson M.D.   On: 03/03/2014 15:56    Scheduled Meds: . amiodarone  400 mg Oral BID  . ARIPiprazole  10 mg Oral Daily  . atorvastatin  40 mg Oral Daily  . DULoxetine  60 mg Oral BID  . lamoTRIgine  100 mg Oral Daily  . levothyroxine  125 mcg Oral QAC breakfast  . metoprolol tartrate  12.5 mg Oral BID  . sodium chloride  3 mL Intravenous Q12H  . topiramate  50 mg Oral BID   Continuous Infusions: . heparin 800 Units/hr (03/03/14 2310)    Principal  Problem:   Atrial fibrillation with RVR Active Problems:   AKI (acute kidney injury)   Hypotension   Long-term (current) use of anticoagulants    Time spent: >35 minutes     Esperanza Sheets  [facility] Pager [phone]. If 7PM-7AM, please contact night-coverage at [email or web address], password [facility] 03/04/2014, 8:09 AM  LOS: 1 day

## 2014-03-04 NOTE — Progress Notes (Addendum)
ANTICOAGULATION CONSULT NOTE - Initial Consult  Pharmacy Consult for Xarelto to Heparin (renal insufficiency) Indication: atrial fibrillation  Allergies  Allergen Reactions  . Amoxicillin Hives  . Avelox [Moxifloxacin Hcl In Nacl] Other (See Comments)    Mental breakdown.  [name] Hydrocodone Itching  . Penicillins Hives  . Sulfa Antibiotics Other (See Comments)    unknown    Patient Measurements: Height: 5' 3.5" (161.3 cm) Weight: 167 lb 1.7 oz (75.8 kg) IBW/kg (Calculated) : 53.55  Vital Signs: Temp: 98.3 F (36.8 C) (06/26 0400) Temp src: Oral (06/26 0400) BP: 94/68 mmHg (06/26 0700)  Labs:  Recent Labs  03/03/14 1539 03/03/14 1547 03/03/14 2147 03/04/14 0642  HGB 14.0 14.6  --  12.9  HCT 40.5 43.0  --  36.8  PLT 136*  --   --  126*  APTT  --   --  31 110*  HEPARINUNFRC  --   --  0.28*  --   CREATININE 2.76* 2.90*  --   --     Estimated Creatinine Clearance: 21.4 ml/min (by C-G formula based on Cr of 2.9).   Medical History: Past Medical History  Diagnosis Date  . CAD (coronary artery disease)     a.  cath 09/2010: LAD stent patent, S-Int/dCFX ok, S-PDA ok, L-LAD atretic;  b. Lexiscan Myoview (02/2014):  no ischemia, EF 55%   . Hypertension   . Hx of CABG   . Hx of transesophageal echocardiography (TEE) for monitoring 11/2010    TEE 11/2010: EF 60-65%, BAE, trivial atrial septal shunt;  right heart cath in 10/2010 with elevated R and L heart pressures and diuretic started  . HLD (hyperlipidemia)   . Hypothyroidism   . Sleep apnea   . COPD (chronic obstructive pulmonary disease)   . Pulmonary nodules     repeat CT due in 11/2011  . Acute right MCA stroke 11/07/10  . 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 Eczema   . Depression   . Atrial fibrillation     a. s/p TEE-DCCV 02/2104; b. Xarelto started  . Cardiomyopathy with EF 40% at TEE 02/17/14 (likely tachycardia mediated - Myoview 02/19/14 neg for ischemia with normal EF) 02/18/2014     Assessment: 58 year old female on Xarelto PTA for Afib.  Now with renal insufficiency -- to transition to heparin until renal function recovers. Aptt this am was 110, just above therapeutic range, will recheck aptt and anti-xa level this afternoon.  Goal of Therapy:  Heparin level 0.3-0.7 units/ml PTT = 66 to 102 seconds Monitor platelets by anticoagulation protocol: Yes   Plan:  1) Reduce heparin to 700 units/hr 2) Recheck HL/aptt at noon  Sheppard Coil PharmD., BCPS Clinical Pharmacist Pager [phone] 03/04/2014 8:28 AM  Addendum:  Pharmacy consulted to start levofloxacin for uti. Noted renal dysfunction will adjust to low dose daily. U/a shows many bacteria, positive for nitrite/leukocytes. No fevers noted, wbc 5.  Plan: Levaquin 250mg  po daily would recommend 3 days of treatment  03/04/2014 8:45 AM

## 2014-03-04 NOTE — Progress Notes (Signed)
     SUBJECTIVE: Feeling better this am. No SOB or pain.   BP 94/68  Pulse 83  Temp(Src) 98.3 F (36.8 C) (Oral)  Resp 26  Ht 5' 3.5" (1.613 m)  Wt 167 lb 1.7 oz (75.8 kg)  BMI 29.13 kg/m2  SpO2 90%  Intake/Output Summary (Last 24 hours) at 03/04/14 1962 Last data filed at 03/04/14 0800  Gross per 24 hour  Intake 2554.67 ml  Output    710 ml  Net 1844.67 ml    PHYSICAL EXAM General: Well developed, well nourished, in no acute distress. Alert and oriented x 3.  Psych:  Good affect, responds appropriately Neck: No JVD. No masses noted.  Lungs: Clear bilaterally with no wheezes or rhonci noted.  Heart: Irreg irreg with no murmurs noted. Abdomen: Bowel sounds are present. Soft, non-tender.  Extremities: No lower extremity edema.   LABS: Basic Metabolic Panel:  Recent Labs  22/97/98 1539 03/03/14 1547 03/04/14 0737  NA 139 138 139  K 4.5 4.4 4.0  CL 101 103 105  CO2 21  --  18*  GLUCOSE 77 78 87  BUN 43* 39* 36*  CREATININE 2.76* 2.90* 1.96*  CALCIUM 8.9  --  8.3*   CBC:  Recent Labs  03/03/14 1539 03/03/14 1547 03/04/14 0642  WBC 4.4  --  5.0  NEUTROABS 2.7  --   --   HGB 14.0 14.6 12.9  HCT 40.5 43.0 36.8  MCV 92.0  --  91.5  PLT 136*  --  126*   Current Meds: . amiodarone  400 mg Oral BID  . ARIPiprazole  10 mg Oral Daily  . atorvastatin  40 mg Oral Daily  . DULoxetine  60 mg Oral BID  . lamoTRIgine  100 mg Oral Daily  . levofloxacin  250 mg Oral Daily  . levothyroxine  125 mcg Oral QAC breakfast  . metoprolol tartrate  12.5 mg Oral BID  . sodium chloride  3 mL Intravenous Q12H  . topiramate  50 mg Oral BID    ASSESSMENT AND PLAN:  1. Atrial fibrillation with RVR:  Rate controlled on po amiodarone. Would continue amiodarone for now. She has been on Xarelto at home but his has been held due to her acute renal failure. Continue IV Heparin for now. Hopefully her renal function will continue to improve over the weekend and she can be started  back on Xarelto before discharge. Continue low dose beta blocker.   2. Acute renal failure: Likely due to overdiuresis. Lasix on hold. Resume Ace-inh after renal function normalizes.   Should be ok for telemetry unit today.   MCALHANY,CHRISTOPHER  6/26/20159:07 AM

## 2014-03-05 DIAGNOSIS — I4891 Unspecified atrial fibrillation: Secondary | ICD-10-CM | POA: Diagnosis not present

## 2014-03-05 DIAGNOSIS — I959 Hypotension, unspecified: Secondary | ICD-10-CM

## 2014-03-05 DIAGNOSIS — N179 Acute kidney failure, unspecified: Secondary | ICD-10-CM | POA: Diagnosis not present

## 2014-03-05 LAB — CBC
HCT: 35 % — ABNORMAL LOW (ref 36.0–46.0)
Hemoglobin: 12 g/dL (ref 12.0–15.0)
MCH: 31.1 pg (ref 26.0–34.0)
MCHC: 34.3 g/dL (ref 30.0–36.0)
MCV: 90.7 fL (ref 78.0–100.0)
PLATELETS: 101 10*3/uL — AB (ref 150–400)
RBC: 3.86 MIL/uL — ABNORMAL LOW (ref 3.87–5.11)
RDW: 12.1 % (ref 11.5–15.5)
WBC: 2.9 10*3/uL — ABNORMAL LOW (ref 4.0–10.5)

## 2014-03-05 LAB — BASIC METABOLIC PANEL
BUN: 33 mg/dL — ABNORMAL HIGH (ref 6–23)
CO2: 18 mEq/L — ABNORMAL LOW (ref 19–32)
Calcium: 7.9 mg/dL — ABNORMAL LOW (ref 8.4–10.5)
Chloride: 107 mEq/L (ref 96–112)
Creatinine, Ser: 1.49 mg/dL — ABNORMAL HIGH (ref 0.50–1.10)
GFR calc Af Amer: 44 mL/min — ABNORMAL LOW (ref >90)
GFR calc non Af Amer: 38 mL/min — ABNORMAL LOW (ref >90)
Glucose, Bld: 78 mg/dL (ref 70–99)
Potassium: 4.2 mEq/L (ref 3.7–5.3)
SODIUM: 139 meq/L (ref 137–147)

## 2014-03-05 LAB — HEPARIN LEVEL (UNFRACTIONATED): Heparin Unfractionated: 0.35 IU/mL (ref 0.30–0.70)

## 2014-03-05 MED ORDER — RIVAROXABAN 20 MG PO TABS
20.0000 mg | ORAL_TABLET | Freq: Every day | ORAL | Status: DC
  Administered 2014-03-05 – 2014-03-06 (×2): 20 mg via ORAL
  Filled 2014-03-05 (×2): qty 1

## 2014-03-05 MED ORDER — METOPROLOL TARTRATE 12.5 MG HALF TABLET
12.5000 mg | ORAL_TABLET | Freq: Two times a day (BID) | ORAL | Status: DC
  Filled 2014-03-05 (×2): qty 1

## 2014-03-05 NOTE — Progress Notes (Signed)
Subjective:  Feeling better at the present time but had some low blood pressure overnight. Chronic chest wall pain. No shortness of breath area and  Objective:  Vital Signs in the last 24 hours: BP 84/66  Pulse 76  Temp(Src) 97.5 F (36.4 C) (Oral)  Resp 15  Ht 5' 3.5" (1.613 m)  Wt 76.749 kg (169 lb 3.2 oz)  BMI 29.50 kg/m2  SpO2 98%  Physical Exam: Pleasant female in no acute distress, appears depressed Lungs:  Clear Cardiac: Irregular rhythm, normal S1 and S2, no S3 Extremities:  No edema present  Intake/Output from previous day: 06/26 0701 - 06/27 0700 In: 3732 [P.O.:1920; I.V.:1812] Out: 1900 [Urine:1900]  Weight Filed Weights   03/03/14 1815 03/05/14 0500  Weight: 75.8 kg (167 lb 1.7 oz) 76.749 kg (169 lb 3.2 oz)    Lab Results: Basic Metabolic Panel:  Recent Labs  07/37/10 0737 03/05/14 0311  NA 139 139  K 4.0 4.2  CL 105 107  CO2 18* 18*  GLUCOSE 87 78  BUN 36* 33*  CREATININE 1.96* 1.49*   CBC:  Recent Labs  03/03/14 1539  03/04/14 0642 03/05/14 0311  WBC 4.4  --  5.0 2.9*  NEUTROABS 2.7  --   --   --   HGB 14.0  < > 12.9 12.0  HCT 40.5  < > 36.8 35.0*  MCV 92.0  --  91.5 90.7  PLT 136*  --  126* 101*  < > = values in this interval not displayed.  Cardiac Enzymes: Troponin (Point of Care Test)  Recent Labs  03/03/14 1547  TROPIPOC 0.03    Telemetry: Reviewed   Assessment/Plan:  1. Recurrent atrial fibrillation currently rate controlled 2. Hypotension overnight of uncertain cause 3. Coronary artery disease with previous bypass grafting 4. Chronic chest pain syndrome 5. Acute renal failure improving  Recommendations:  I would stop her beta blocker in view of her low blood pressure at this time. Continue hydration. Watch fluid status carefully. Continue amiodarone loading. Watching in  unit for the time being. Consider restarting XARELTO as renal function improves.     Darden Palmer  MD  [facility]  03/05/2014, 12:04 PM

## 2014-03-05 NOTE — Progress Notes (Addendum)
ANTICOAGULATION CONSULT NOTE - Follow -Up Consult  Pharmacy Consult for Xarelto to Heparin (renal insufficiency) Indication: atrial fibrillation  Allergies  Allergen Reactions  . Amoxicillin Hives  . Avelox [Moxifloxacin Hcl In Nacl] Other (See Comments)    Mental breakdown.  [name] Hydrocodone Itching  . Penicillins Hives  . Sulfa Antibiotics Other (See Comments)    unknown    Patient Measurements: Height: 5' 3.5" (161.3 cm) Weight: 169 lb 3.2 oz (76.749 kg) IBW/kg (Calculated) : 53.55  Vital Signs: Temp: 97.6 F (36.4 C) (06/27 0400) Temp src: Oral (06/27 0400) BP: 84/66 mmHg (06/27 0737) Pulse Rate: 76 (06/27 0737)  Labs:  Recent Labs  03/03/14 1539 03/03/14 1547 03/03/14 2147 03/04/14 0642 03/04/14 0737 03/04/14 1555 03/05/14 0311  HGB 14.0 14.6  --  12.9  --   --  12.0  HCT 40.5 43.0  --  36.8  --   --  35.0*  PLT 136*  --   --  126*  --   --  101*  APTT  --   --  31 110*  --  73*  --   HEPARINUNFRC  --   --  0.28*  --   --  0.44 0.35  CREATININE 2.76* 2.90*  --   --  1.96*  --  1.49*    Estimated Creatinine Clearance: 41.8 ml/min (by C-G formula based on Cr of 1.49).   Medical History: Past Medical History  Diagnosis Date  . CAD (coronary artery disease)     a.  cath 09/2010: LAD stent patent, S-Int/dCFX ok, S-PDA ok, L-LAD atretic;  b. Lexiscan Myoview (02/2014):  no ischemia, EF 55%   . Hypertension   . Hx of CABG   . Hx of transesophageal echocardiography (TEE) for monitoring 11/2010    TEE 11/2010: EF 60-65%, BAE, trivial atrial septal shunt;  right heart cath in 10/2010 with elevated R and L heart pressures and diuretic started  . HLD (hyperlipidemia)   . Hypothyroidism   . Sleep apnea   . COPD (chronic obstructive pulmonary disease)   . Pulmonary nodules     repeat CT due in 11/2011  . Acute right MCA stroke 11/07/10  . 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 Eczema   . Depression   . Atrial fibrillation     a. s/p TEE-DCCV 02/2104;  b. Xarelto started  . Cardiomyopathy with EF 40% at TEE 02/17/14 (likely tachycardia mediated - Myoview 02/19/14 neg for ischemia with normal EF) 02/18/2014    Assessment: 58 year old female on Xarelto PTA for Afib.  Now with renal insufficiency -- to transition to heparin until renal function recovers.   Heparin level now at goal on IV heparin 700 units/hr.  No bleeding or complications noted. Anti-xa levels appropriately correlated now, aptt's no longer needed. No rate changes needed  Goal of Therapy:  Heparin level 0.3-0.7 units/ml Monitor platelets by anticoagulation protocol: Yes   Plan:  1) Continue heparin at current rate. 2) Will d/c PTT monitoring, and just follow heparin levels. 3) Daily heparin level and CBC.  Sheppard Coil PharmD., BCPS Clinical Pharmacist Pager [phone] 03/05/2014 8:07 AM  Addendum:  New orders to transition back to xarelto this evening. Will stop IV heparin at that time.   Will restart Xarelto at 20mg  daily (prior home dose), expect renal function to continue to improve.  03/05/2014 11:05 AM

## 2014-03-05 NOTE — Progress Notes (Addendum)
[facility] PROGRESS NOTE  [name] EHO:[id number] DOB: 1956/08/07 DOA: 03/03/2014 PCP: [name], MD  Assessment/Plan: Principal Problem:  Atrial fibrillation with RVR  Active Problems:  AKI (acute kidney injury)  Hypotension   58 y.o. female with PMH of HTN, CAD s/p CABG, CHF, COPD, h/o CVA, recently admitted by cardiology for PAF s/p DCCV (6/11) Presented with SOB, dizziness, palpitations, found in a fib RVR, and AKI, UTI  1. A fib RVR; off Cardizem per cardiology; hemodynamically labile BP -monitor in SDU, on IV heparin, resume xarelto per pharmacy; on PO amio per cardiology, BB; appreciate the input  -s/p recent DCCV (6/11);  cardiology following  2. AKI likely ATN/hypovolemia on diuretics, on top of ACE  -renal BB:CWUGQB ultrasound appearance of the kidneys and bladder, UA + infection  -BMP is improving; cont gentle IVF, close monitor volume status due to chronic CHF, I/O, urine output  -hold diuretics, ACE  3. Chronic systolic CHF;  -euvolemic, cont gentle IVF for renal failure; monitor fluid status closely; hold ACE;  -resume lasix as needed clinical/lab evaluation  4. CAD s/p CABG; denies chest pain; cont statin, added BB  5. Hypothyroidism, recheck tsh-1.5; cont levothyroxine  6. COPD, Pt reports stopping smoking this month; no wheezing on exam; cont inhaler prn  7. UTI, start levofloxacin; (Pt reports rash to amoxicillin, but anxiety, irritation to levofloxacin) -try levofloxacin; monitor  8. Hypotension, l;ikely in the setting of dehydration, AKI, UTI;  -cont IVF, IV atx;  -if BP improves TF to [facility];  monitor   Cardiology; if consultant consulted, please document name and whether formally or informally consulted   Code Status: full (must indicate code status--if unknown or must be presumed, indicate so)  Family Communication: D/w patient she updated her son, called to update her son at [phone] no answer. Try later  (indicate person spoken with,  if applicable, with phone number if by telephone)  Disposition Plan: home pend clinical improvement (indicate anticipated LOS)   Consultants:  cardiology   Procedures:  Pend echo   Antibiotics:  levofloxacin (indicate start date, and stop date if known)  HPI/Subjective: alert  Objective: Filed Vitals:   03/05/14 0737  BP: 84/66  Pulse: 76  Temp: 97.5 F (36.4 C)  Resp: 15    Intake/Output Summary (Last 24 hours) at 03/05/14 0945 Last data filed at 03/05/14 0700  Gross per 24 hour  Intake   2922 ml  Output   1900 ml  Net   1022 ml   Filed Weights   03/03/14 1815 03/05/14 0500  Weight: 75.8 kg (167 lb 1.7 oz) 76.749 kg (169 lb 3.2 oz)    Exam:   General:  alert  Cardiovascular: s1,s2 rrr  Respiratory: few crackles in LL  Abdomen: soft, nt,nd   Musculoskeletal: pedal mild edema    Data Reviewed: Basic Metabolic Panel:  Recent Labs Lab 03/03/14 1539 03/03/14 1547 03/04/14 0737 03/05/14 0311  NA 139 138 139 139  K 4.5 4.4 4.0 4.2  CL 101 103 105 107  CO2 21  --  18* 18*  GLUCOSE 77 78 87 78  BUN 43* 39* 36* 33*  CREATININE 2.76* 2.90* 1.96* 1.49*  CALCIUM 8.9  --  8.3* 7.9*   Liver Function Tests:  Recent Labs Lab 03/03/14 1539  AST 11  ALT 9  ALKPHOS 72  BILITOT 0.4  PROT 6.8  ALBUMIN 3.8   No results found for this basename: LIPASE, AMYLASE,  in the last 168 hours No results  found for this basename: AMMONIA,  in the last 168 hours CBC:  Recent Labs Lab 03/03/14 1539 03/03/14 1547 03/04/14 0642 03/05/14 0311  WBC 4.4  --  5.0 2.9*  NEUTROABS 2.7  --   --   --   HGB 14.0 14.6 12.9 12.0  HCT 40.5 43.0 36.8 35.0*  MCV 92.0  --  91.5 90.7  PLT 136*  --  126* 101*   Cardiac Enzymes: No results found for this basename: CKTOTAL, CKMB, CKMBINDEX, TROPONINI,  in the last 168 hours BNP (last 3 results)  Recent Labs  02/18/14 1205 03/03/14 1539  PROBNP 1629.0* 644.7*   CBG: No results found for this basename: GLUCAP,  in  the last 168 hours  No results found for this or any previous visit (from the past 240 hour(s)).   Studies: [place] Renal  03/04/2014   CLINICAL DATA:  Increasing creatinine.  EXAM: RENAL/URINARY TRACT ULTRASOUND COMPLETE  COMPARISON:  CT abdomen and pelvis 04/03/2011  FINDINGS: Right Kidney:  Length: 9.3 cm. Echogenicity within normal limits. No mass or hydronephrosis visualized.  Left Kidney:  Length: 9.6 cm. Echogenicity within normal limits. No mass or hydronephrosis visualized.  Bladder:  Appears normal for degree of bladder distention. Prevoid volume is measured at 122 ml.  IMPRESSION: Normal ultrasound appearance of the kidneys and bladder. No evidence of obstruction.   Electronically Signed   By: Burman NievesWilliam  Stevens M.D.   On: 03/04/2014 02:40   Dg Chest Port 1 View  03/03/2014   CLINICAL DATA:  Shortness breath. Cough. Chest wall pain near sternum. Infection post CABG with removal of portion of the sternum filled with muscle from arm. Hypertension.  EXAM: PORTABLE CHEST - 1 VIEW  COMPARISON:  08/10/2011  FINDINGS: Post CABG.  Cardiomegaly.  Central pulmonary vascular prominence without pulmonary edema.  Surgical clips project over the lateral aspect the right upper lobe.  No segmental consolidation or gross pneumothorax.  Limited evaluation of the sternum by portable chest examination. If there were a clinical suspicion of abnormality at this level, CT imaging would be necessary for further delineation.  IMPRESSION: Post CABG.  Cardiomegaly.  Central pulmonary vascular prominence without pulmonary edema.  No segmental consolidation.  Limited evaluation of the sternum by portable chest examination. If there were a clinical suspicion of abnormality at this level, CT imaging would be necessary for further delineation.   Electronically Signed   By: Bridgett LarssonSteve  Olson M.D.   On: 03/03/2014 15:56    Scheduled Meds: . amiodarone  400 mg Oral BID  . ARIPiprazole  10 mg Oral Daily  . atorvastatin  40 mg Oral Daily   . DULoxetine  60 mg Oral BID  . lamoTRIgine  100 mg Oral Daily  . levofloxacin  250 mg Oral Daily  . levothyroxine  125 mcg Oral QAC breakfast  . metoprolol tartrate  12.5 mg Oral BID  . sodium chloride  3 mL Intravenous Q12H  . topiramate  50 mg Oral BID   Continuous Infusions: . sodium chloride 75 mL/hr at 03/04/14 2124  . heparin 700 Units/hr (03/05/14 0445)    Principal Problem:   Atrial fibrillation with RVR Active Problems:   AKI (acute kidney injury)   Hypotension   Long-term (current) use of anticoagulants    Time spent: >35 minutes     Esperanza SheetsBURIEV, ULUGBEK N  [facility] Pager [phone]. If 7PM-7AM, please contact night-coverage at [email or web address], password [email or web address] 03/05/2014, 9:45 AM  LOS: 2 days

## 2014-03-05 NOTE — Progress Notes (Signed)
Transferred to 3east room23 by wheelchair, stable. Report given to RN, belongings with pt.

## 2014-03-05 NOTE — Discharge Instructions (Signed)

## 2014-03-06 DIAGNOSIS — I4891 Unspecified atrial fibrillation: Secondary | ICD-10-CM | POA: Diagnosis not present

## 2014-03-06 DIAGNOSIS — N390 Urinary tract infection, site not specified: Secondary | ICD-10-CM

## 2014-03-06 DIAGNOSIS — N179 Acute kidney failure, unspecified: Secondary | ICD-10-CM | POA: Diagnosis not present

## 2014-03-06 DIAGNOSIS — I5032 Chronic diastolic (congestive) heart failure: Secondary | ICD-10-CM | POA: Diagnosis not present

## 2014-03-06 LAB — BASIC METABOLIC PANEL
BUN: 20 mg/dL (ref 6–23)
CO2: 19 meq/L (ref 19–32)
Calcium: 8 mg/dL — ABNORMAL LOW (ref 8.4–10.5)
Chloride: 110 mEq/L (ref 96–112)
Creatinine, Ser: 1.22 mg/dL — ABNORMAL HIGH (ref 0.50–1.10)
GFR calc Af Amer: 56 mL/min — ABNORMAL LOW (ref >90)
GFR calc non Af Amer: 49 mL/min — ABNORMAL LOW (ref >90)
GLUCOSE: 82 mg/dL (ref 70–99)
POTASSIUM: 3.7 meq/L (ref 3.7–5.3)
Sodium: 143 mEq/L (ref 137–147)

## 2014-03-06 LAB — CBC
HCT: 32.8 % — ABNORMAL LOW (ref 36.0–46.0)
Hemoglobin: 11.1 g/dL — ABNORMAL LOW (ref 12.0–15.0)
MCH: 30.7 pg (ref 26.0–34.0)
MCHC: 33.8 g/dL (ref 30.0–36.0)
MCV: 90.9 fL (ref 78.0–100.0)
Platelets: 119 10*3/uL — ABNORMAL LOW (ref 150–400)
RBC: 3.61 MIL/uL — ABNORMAL LOW (ref 3.87–5.11)
RDW: 12 % (ref 11.5–15.5)
WBC: 3.3 10*3/uL — AB (ref 4.0–10.5)

## 2014-03-06 LAB — URINE CULTURE: SPECIAL REQUESTS: NORMAL

## 2014-03-06 MED ORDER — FUROSEMIDE 40 MG PO TABS: 20.0000 mg | ORAL_TABLET | Freq: Every day | ORAL | Status: DC

## 2014-03-06 MED ORDER — LEVOFLOXACIN 250 MG PO TABS: 250.0000 mg | ORAL_TABLET | Freq: Every day | ORAL | Status: DC

## 2014-03-06 MED ORDER — ALBUTEROL SULFATE HFA 108 (90 BASE) MCG/ACT IN AERS: 2.0000 | INHALATION_SPRAY | Freq: Four times a day (QID) | RESPIRATORY_TRACT | Status: DC | PRN

## 2014-03-06 MED ORDER — FUROSEMIDE 40 MG PO TABS: 40.0000 mg | ORAL_TABLET | Freq: Every day | ORAL | Status: DC

## 2014-03-06 MED ORDER — AMIODARONE HCL 400 MG PO TABS: 400.0000 mg | ORAL_TABLET | Freq: Two times a day (BID) | ORAL | Status: DC

## 2014-03-06 NOTE — Discharge Summary (Signed)
Physician Discharge Summary  Tina BenesKathi G [name] ZOX:[id number] DOB: [birth year] DOA: 03/03/2014  PCP: Tina HarpPANOSH,Tina KOTVAN, MD  Admit date: 03/03/2014 Discharge date: 03/06/2014  Time spent: >35 minutes  Recommendations for Outpatient Follow-up:  F/u with PCP next week  F/u with cardiology in 2-3 days  Discharge Diagnoses:  Principal Problem:   Atrial fibrillation with RVR Active Problems:   AKI (acute kidney injury)   Hypotension   Long-term (current) use of anticoagulants   Discharge Condition: stable   Diet recommendation: DM, low sodium   Filed Weights   03/05/14 0500 03/05/14 1730 03/06/14 0601  Weight: 76.749 kg (169 lb 3.2 oz) 77.701 kg (171 lb 4.8 oz) 78.109 kg (172 lb 3.2 oz)    History of present illness:   58 y.o. female with PMH of HTN, CAD s/p CABG, CHF, COPD, h/o CVA, recently admitted by cardiology for PAF s/p DCCV (6/11) Presented with SOB, dizziness, palpitations, found in a fib RVR, and AKI, UTI   Hospital Course:  1. A fib RVR; started on PO amio per cardiology, HR improved; off Cardizem per cardiology; hemodynamically stable;  -resume xarelto per pharmacy; BB on hold due to low BP;  appreciate the input  -s/p recent DCCV (6/11);  cardiology following  2. AKI likely ATN/hypovolemia on diuretics, on top of ACE  -renal US: Normal ultrasound appearance of the kidneys and bladder, UA + infection  -BMP is improved after holding ACE, diuretic; +gentle IVF  -hold ACE; clinically euvolemic; resume lasix upon d/c 20 QD;  BMP next week  3. Chronic systolic CHF;  -euvolemic, hold ACE due to renal falure; resume lasix 20 QD, adjust as needed as OP next week  4. CAD s/p CABG; denies chest pain; cont statin 5. Hypothyroidism, recheck tsh-1.5; cont levothyroxine  6. COPD, Pt reports stopping smoking this month; no wheezing on exam; cont inhaler prn  7. UTI, start levofloxacin; afebrile, no leukocytosis, symptoms improved; (Pt reports rash to amoxicillin, but anxiety,  irritation to levofloxacin) -complete levofloxacin OP; monitor  -cutures E coli pend sens; Pt wants to go home and f/u cultures with PCP on Monday 8. Hypotension, likely in the setting of dehydration, AKI,  -improved;     Procedures:   (i.e. Studies not automatically included, echos, thoracentesis, etc; not x-rays)  Consultations:  Cardiology   Discharge Exam: Filed Vitals:   03/06/14 0601  BP: 103/62  Pulse: 81  Temp: 97.3 F (36.3 C)  Resp: 18    General: alert Cardiovascular: s1,s2 rrr Respiratory: few LL crackles   Discharge Instructions     Medication List    STOP taking these medications       lisinopril 20 MG tablet  Commonly known as:  PRINIVIL,ZESTRIL      TAKE these medications       albuterol 108 (90 BASE) MCG/ACT inhaler  Commonly known as:  PROVENTIL HFA;VENTOLIN HFA  Inhale 2 puffs into the lungs every 6 (six) hours as needed for wheezing or shortness of breath.     amiodarone 400 MG tablet  Commonly known as:  PACERONE  Take 1 tablet (400 mg total) by mouth 2 (two) times daily.     ARIPiprazole 10 MG tablet  Commonly known as:  ABILIFY  Take 10 mg by mouth daily.     atorvastatin 80 MG tablet  Commonly known as:  LIPITOR  Take 0.5 tablets (40 mg total) by mouth daily.     DULoxetine 30 MG capsule  Commonly known as:  CYMBALTA  Take 2  capsules (60 mg total) by mouth 2 (two) times daily.     furosemide 40 MG tablet  Commonly known as:  LASIX  Take 0.5 tablets (20 mg total) by mouth daily.     lamoTRIgine 100 MG tablet  Commonly known as:  LAMICTAL  Take 1 tablet (100 mg total) by mouth daily.     levofloxacin 250 MG tablet  Commonly known as:  LEVAQUIN  Take 1 tablet (250 mg total) by mouth daily.     levothyroxine 125 MCG tablet  Commonly known as:  SYNTHROID, LEVOTHROID  Take 1 tablet (125 mcg total) by mouth daily.     potassium chloride SA 20 MEQ tablet  Commonly known as:  K-DUR,KLOR-CON  Take 1 tablet (20 mEq total)  by mouth 2 (two) times daily.     rivaroxaban 20 MG Tabs tablet  Commonly known as:  XARELTO  Take 1 tablet (20 mg total) by mouth daily with supper.     topiramate 50 MG tablet  Commonly known as:  TOPAMAX  Take 1 tablet (50 mg total) by mouth 2 (two) times daily. Start one tablet at night x 1 week then twice daily     traMADol 50 MG tablet  Commonly known as:  ULTRAM  Take 2 tablets (100 mg total) by mouth every 6 (six) hours as needed for moderate pain.       Allergies  Allergen Reactions  . Amoxicillin Hives  . Avelox [Moxifloxacin Hcl In Nacl] Other (See Comments)    Mental breakdown.  [name] Hydrocodone Itching  . Penicillins Hives  . Sulfa Antibiotics Other (See Comments)    unknown      The results of significant diagnostics from this hospitalization (including imaging, microbiology, ancillary and laboratory) are listed below for reference.    Significant Diagnostic Studies: US Renal  03/04/2014   CLINICAL DATA:  Increasing creatinine.  EXAM: RENAL/URINARY TRACT ULTRASOUND COMPLETE  COMPARISON:  CT abdomen and pelvis 04/03/2011  FINDINGS: Right Kidney:  Length: 9.3 cm. Echogenicity within normal limits. No mass or hydronephrosis visualized.  Left Kidney:  Length: 9.6 cm. Echogenicity within normal limits. No mass or hydronephrosis visualized.  Bladder:  Appears normal for degree of bladder distention. Prevoid volume is measured at 122 ml.  IMPRESSION: Normal ultrasound appearance of the kidneys and bladder. No evidence of obstruction.   Electronically Signed   By: [name] M.D.   On: 03/04/2014 02:40   Nm Myocar Multi W/spect W/wall Motion / Ef  02/19/2014   CLINICAL DATA:  Cardiomyopathy.  EXAM: MYOCARDIAL IMAGING WITH SPECT (REST AND PHARMACOLOGIC-STRESS)  GATED LEFT VENTRICULAR WALL MOTION STUDY  LEFT VENTRICULAR EJECTION FRACTION  TECHNIQUE: Standard myocardial SPECT imaging performed after resting intravenous injection of 10 mCi Tc-13m sestimibi. Subsequently,  intravenous infusion of regadenoson performed under the supervision of the Cardiology staff. At peak effect of the drug, 30 mCi Tc-65m sestimibi injected intravenously and standard myocardial SPECT imaging performed. Quantitative gated imaging also performed to evaluate left ventricular wall motion and estimate left ventricular ejection fraction.  FINDINGS: MYOCARDIAL IMAGING WITH SPECT (REST AND PHARMACOLOGIC-STRESS)  Myocardial perfusion is normal on the stress images. No evidence for reversibility or myocardial ischemia.  GATED LEFT VENTRICULAR WALL MOTION STUDY  Review of the gated images demonstrates normal wall motion.  LEFT VENTRICULAR EJECTION FRACTION  QGS ejection fraction measures 55% , with an end-diastolic volume of 91 ml and an end-systolic volume of 41 ml.  IMPRESSION: No evidence for reversibility or myocardial ischemia.  Calculated  ejection fraction is 55%.   Electronically Signed   By: Richarda Overlie M.D.   On: 02/19/2014 14:21   Dg Chest Port 1 View  03/03/2014   CLINICAL DATA:  Shortness breath. Cough. Chest wall pain near sternum. Infection post CABG with removal of portion of the sternum filled with muscle from arm. Hypertension.  EXAM: PORTABLE CHEST - 1 VIEW  COMPARISON:  08/10/2011  FINDINGS: Post CABG.  Cardiomegaly.  Central pulmonary vascular prominence without pulmonary edema.  Surgical clips project over the lateral aspect the right upper lobe.  No segmental consolidation or gross pneumothorax.  Limited evaluation of the sternum by portable chest examination. If there were a clinical suspicion of abnormality at this level, CT imaging would be necessary for further delineation.  IMPRESSION: Post CABG.  Cardiomegaly.  Central pulmonary vascular prominence without pulmonary edema.  No segmental consolidation.  Limited evaluation of the sternum by portable chest examination. If there were a clinical suspicion of abnormality at this level, CT imaging would be necessary for further  delineation.   Electronically Signed   By: Bridgett Larsson M.D.   On: 03/03/2014 15:56    Microbiology: Recent Results (from the past 240 hour(s))  URINE CULTURE     Status: None   Collection Time    03/04/14  9:25 AM      Result Value Ref Range Status   Specimen Description URINE, RANDOM   Final   Special Requests Normal   Final   Culture  Setup Time     Final   Value: 03/04/2014 12:42     Performed at [facility] Count     Final   Value: >=100,000 COLONIES/ML     Performed at [facility]   Culture     Final   Value: ESCHERICHIA COLI     Performed at [facility]   Report Status PENDING   Incomplete     Labs: Basic Metabolic Panel:  Recent Labs Lab 03/03/14 1539 03/03/14 1547 03/04/14 0737 03/05/14 0311 03/06/14 0430  NA 139 138 139 139 143  K 4.5 4.4 4.0 4.2 3.7  CL 101 103 105 107 110  CO2 21  --  18* 18* 19  GLUCOSE 77 78 87 78 82  BUN 43* 39* 36* 33* 20  CREATININE 2.76* 2.90* 1.96* 1.49* 1.22*  CALCIUM 8.9  --  8.3* 7.9* 8.0*   Liver Function Tests:  Recent Labs Lab 03/03/14 1539  AST 11  ALT 9  ALKPHOS 72  BILITOT 0.4  PROT 6.8  ALBUMIN 3.8   No results found for this basename: LIPASE, AMYLASE,  in the last 168 hours No results found for this basename: AMMONIA,  in the last 168 hours CBC:  Recent Labs Lab 03/03/14 1539 03/03/14 1547 03/04/14 0642 03/05/14 0311 03/06/14 0430  WBC 4.4  --  5.0 2.9* 3.3*  NEUTROABS 2.7  --   --   --   --   HGB 14.0 14.6 12.9 12.0 11.1*  HCT 40.5 43.0 36.8 35.0* 32.8*  MCV 92.0  --  91.5 90.7 90.9  PLT 136*  --  126* 101* 119*   Cardiac Enzymes: No results found for this basename: CKTOTAL, CKMB, CKMBINDEX, TROPONINI,  in the last 168 hours BNP: BNP (last 3 results)  Recent Labs  02/18/14 1205 03/03/14 1539  PROBNP 1629.0* 644.7*   CBG: No results found for this basename: GLUCAP,  in the last 168 hours     Signed:  Jonette Mate N  [facility] 03/06/2014, 8:34 AM

## 2014-03-06 NOTE — Progress Notes (Signed)
[facility] PROGRESS NOTE  [name] WIO:[id number] DOB: 1955/10/24 DOA: 03/03/2014 PCP: [name], MD  Assessment/Plan: Principal Problem:  Atrial fibrillation with RVR  Active Problems:  AKI (acute kidney injury)  Hypotension   58 y.o. female with PMH of HTN, CAD s/p CABG, CHF, COPD, h/o CVA, recently admitted by cardiology for PAF s/p DCCV (6/11) Presented with SOB, dizziness, palpitations, found in a fib RVR, and AKI, UTI  1. A fib RVR; off Cardizem per cardiology; hemodynamically labile BP -resume xarelto per pharmacy; on PO amio per cardiology, BB on hold;  appreciate the input  -s/p recent DCCV (6/11);  cardiology following  2. AKI likely ATN/hypovolemia on diuretics, on top of ACE  -renal EQ:ASTMHD ultrasound appearance of the kidneys and bladder, UA + infection  -BMP is improving; cont gentle IVF, close monitor volume status due to chronic CHF, I/O, urine output  -hold diuretics, ACE; resume as needed  3. Chronic systolic CHF;  -euvolemic, cont gentle IVF for renal failure; monitor fluid status closely; hold ACE;  -resume lasix as needed clinical/lab evaluation  4. CAD s/p CABG; denies chest pain; cont statin, added BB  5. Hypothyroidism, recheck tsh-1.5; cont levothyroxine  6. COPD, Pt reports stopping smoking this month; no wheezing on exam; cont inhaler prn  7. UTI, start levofloxacin; (Pt reports rash to amoxicillin, but anxiety, irritation to levofloxacin) -cont levofloxacin; monitor  8. Hypotension, l;ikely in the setting of dehydration, AKI, UTI;  -cont IVF, IV atx; cutures E coli pend sens; Pt wants to go hoem and f/u cultures with PCP on Monday  Possible d/c today after PT eval   Cardiology; if consultant consulted, please document name and whether formally or informally consulted   Code Status: full (must indicate code status--if unknown or must be presumed, indicate so)  Family Communication: D/w patient she updated her son, called to  update her son at [phone] no answer. Try later  (indicate person spoken with, if applicable, with phone number if by telephone)  Disposition Plan: home pend clinical improvement (indicate anticipated LOS)   Consultants:  cardiology   Procedures:  Pend echo   Antibiotics:  levofloxacin (indicate start date, and stop date if known)  HPI/Subjective: alert  Objective: Filed Vitals:   03/06/14 0601  BP: 103/62  Pulse: 81  Temp: 97.3 F (36.3 C)  Resp: 18    Intake/Output Summary (Last 24 hours) at 03/06/14 0832 Last data filed at 03/06/14 0700  Gross per 24 hour  Intake   2961 ml  Output   1700 ml  Net   1261 ml   Filed Weights   03/05/14 0500 03/05/14 1730 03/06/14 0601  Weight: 76.749 kg (169 lb 3.2 oz) 77.701 kg (171 lb 4.8 oz) 78.109 kg (172 lb 3.2 oz)    Exam:   General:  alert  Cardiovascular: s1,s2 rrr  Respiratory: few crackles in LL  Abdomen: soft, nt,nd   Musculoskeletal: pedal mild edema    Data Reviewed: Basic Metabolic Panel:  Recent Labs Lab 03/03/14 1539 03/03/14 1547 03/04/14 0737 03/05/14 0311 03/06/14 0430  NA 139 138 139 139 143  K 4.5 4.4 4.0 4.2 3.7  CL 101 103 105 107 110  CO2 21  --  18* 18* 19  GLUCOSE 77 78 87 78 82  BUN 43* 39* 36* 33* 20  CREATININE 2.76* 2.90* 1.96* 1.49* 1.22*  CALCIUM 8.9  --  8.3* 7.9* 8.0*   Liver Function Tests:  Recent Labs Lab 03/03/14 1539  AST 11  ALT 9  ALKPHOS 72  BILITOT 0.4  PROT 6.8  ALBUMIN 3.8   No results found for this basename: LIPASE, AMYLASE,  in the last 168 hours No results found for this basename: AMMONIA,  in the last 168 hours CBC:  Recent Labs Lab 03/03/14 1539 03/03/14 1547 03/04/14 0642 03/05/14 0311 03/06/14 0430  WBC 4.4  --  5.0 2.9* 3.3*  NEUTROABS 2.7  --   --   --   --   HGB 14.0 14.6 12.9 12.0 11.1*  HCT 40.5 43.0 36.8 35.0* 32.8*  MCV 92.0  --  91.5 90.7 90.9  PLT 136*  --  126* 101* 119*   Cardiac Enzymes: No results found for this  basename: CKTOTAL, CKMB, CKMBINDEX, TROPONINI,  in the last 168 hours BNP (last 3 results)  Recent Labs  02/18/14 1205 03/03/14 1539  PROBNP 1629.0* 644.7*   CBG: No results found for this basename: GLUCAP,  in the last 168 hours  Recent Results (from the past 240 hour(s))  URINE CULTURE     Status: None   Collection Time    03/04/14  9:25 AM      Result Value Ref Range Status   Specimen Description URINE, RANDOM   Final   Special Requests Normal   Final   Culture  Setup Time     Final   Value: 03/04/2014 12:42     [facility]   [place] Count     Final   Value: >=100,000 COLONIES/ML     [facility]formed at [facility]   Culture     Final   Value: ESCHERICHIA COLI     [facility]formed at [facility]   Report Status PENDING   Incomplete     Studies: No results found.  Scheduled Meds: . amiodarone  400 mg Oral BID  . ARIPiprazole  10 mg Oral Daily  . atorvastatin  40 mg Oral Daily  . DULoxetine  60 mg Oral BID  . lamoTRIgine  100 mg Oral Daily  . levofloxacin  250 mg Oral Daily  . levothyroxine  125 mcg Oral QAC breakfast  . rivaroxaban  20 mg Oral Q supper  . sodium chloride  3 mL Intravenous Q12H  . topiramate  50 mg Oral BID   Continuous Infusions: . sodium chloride 100 mL/hr at 03/05/14 2042    Principal Problem:   Atrial fibrillation with RVR Active Problems:   AKI (acute kidney injury)   Hypotension   Long-term (current) use of anticoagulants    Time spent: >35 minutes     Esperanza SheetsBURIEV, ULUGBEK N  [facility] Pager [phone]. If 7PM-7AM, please contact night-coverage at [email or web address], password [email or web address] 03/06/2014, 8:32 AM  LOS: 3 days

## 2014-03-06 NOTE — Progress Notes (Addendum)
Consulting Cardiologist: Donnie Aho Primary Cardiologist: Andreas Ohm  Subjective:    58 y.o. female with PMH of HTN, CAD s/p CABG, CHF, COPD, h/o CVA, recently admitted by cardiology for PAF s/p DCCV (6/11) Presented with SOB, dizziness, palpitations, found a fib RVR, and AKI (new); Pt reports being dizzy for several days, associated with generalized weakness, occasional palpitations; denies chest pain, no PND, or orthopnea, no LE edema. Now on amiodarone loading.   Complains of continued shortness of breath. No chest pain. No rapid HR.    Objective:   Temp:  [97.3 F (36.3 C)-98.4 F (36.9 C)] 97.3 F (36.3 C) (06/28 0601) Pulse Rate:  [74-81] 81 (06/28 0601) Resp:  [15-26] 18 (06/28 0601) BP: (90-112)/(59-83) 103/62 mmHg (06/28 0601) SpO2:  [93 %-98 %] 98 % (06/28 0601) Weight:  [171 lb 4.8 oz (77.701 kg)-172 lb 3.2 oz (78.109 kg)] 172 lb 3.2 oz (78.109 kg) (06/28 0601) Last BM Date: 03/03/14  Filed Weights   03/05/14 0500 03/05/14 1730 03/06/14 0601  Weight: 169 lb 3.2 oz (76.749 kg) 171 lb 4.8 oz (77.701 kg) 172 lb 3.2 oz (78.109 kg)    Intake/Output Summary (Last 24 hours) at 03/06/14 0905 Last data filed at 03/06/14 0700  Gross per 24 hour  Intake   2879 ml  Output   1700 ml  Net   1179 ml    Telemetry:Atrial fibrillation, rate of 78-80 bpm.  Exam:  General: No acute distress. Sitting up in a chair. HEENT: Conjunctiva and lids normal, oropharynx clear.  Lungs: Clear to auscultation, nonlabored.  Cardiac: No elevated JVP or bruits. Regular RRR, no gallop or rub.   Abdomen: Normoactive bowel sounds, nontender, nondistended.  Extremities: No pitting edema, distal pulses full.  Neuropsychiatric: Alert and oriented x3, affect appropriate.   Lab Results:  Basic Metabolic Panel:  Recent Labs Lab 03/04/14 0737 03/05/14 0311 03/06/14 0430  NA 139 139 143  K 4.0 4.2 3.7  CL 105 107 110  CO2 18* 18* 19  GLUCOSE 87 78 82  BUN 36* 33* 20  CREATININE  1.96* 1.49* 1.22*  CALCIUM 8.3* 7.9* 8.0*    Liver Function Tests:  Recent Labs Lab 03/03/14 1539  AST 11  ALT 9  ALKPHOS 72  BILITOT 0.4  PROT 6.8  ALBUMIN 3.8    CBC:  Recent Labs Lab 03/04/14 0642 03/05/14 0311 03/06/14 0430  WBC 5.0 2.9* 3.3*  HGB 12.9 12.0 11.1*  HCT 36.8 35.0* 32.8*  MCV 91.5 90.7 90.9  PLT 126* 101* 119*    Cardiac Enzymes: No results found for this basename: CKTOTAL, CKMB, CKMBINDEX, TROPONINI,  in the last 168 hours  BNP:  Recent Labs  02/18/14 1205 03/03/14 1539  PROBNP 1629.0* 644.7*     Medications:   Scheduled Medications: . amiodarone  400 mg Oral BID  . ARIPiprazole  10 mg Oral Daily  . atorvastatin  40 mg Oral Daily  . DULoxetine  60 mg Oral BID  . lamoTRIgine  100 mg Oral Daily  . levofloxacin  250 mg Oral Daily  . levothyroxine  125 mcg Oral QAC breakfast  . rivaroxaban  20 mg Oral Q supper  . sodium chloride  3 mL Intravenous Q12H  . topiramate  50 mg Oral BID    Infusions: . sodium chloride 100 mL/hr at 03/05/14 2042    PRN Medications: acetaminophen, acetaminophen, ipratropium-albuterol, ondansetron (ZOFRAN) IV, ondansetron, sodium chloride, traMADol   Assessment and Plan:   1.Atrial Fib with RVR: She is  on amiodarone loading, now on 400mg  twice a day. She denies palpitations, continues to have some shortness of breath with minimal exertion. She has been restarted on Xarelto, with creatinine of 1.22. Dose is 20 mg daily. She has been taken off of Lasix currently due to renal insufficiency with a creatinine of 1.96 on admission. No evidence of fluid retention. However she is complaining of some mild shortness of breath, lungs are clear.   2. Hypotension: Continues hypotensive blood pressures between 84/66-103/62 per last 24-hour documentation. She denies dizziness. Is currently not on any antihypertensive medications. Review of home medications demonstrated she had been on lisinopril 20 mg daily also Lasix 40  mg twice a day. Echocardiogram completed 02/17/2014 demonstrated LV function mildly depressed, as read by Dr. Tenny Crawoss, with no quantification listed. LA appendage was large with prominent pectinate. There was spontaneous contrast seen swirling but no formed thrombus. He had prior myocardial perfusion study revealed 55%.  3. CAD, with previous coronary artery bypass grafting: She is no longer on beta blocker or ACE inhibitor in the setting of hypotension and acute renal failure. Creatinine is improved, heart rate is controlled on amiodarone.   4. UTI: Currently being treated with antibiotics, with Escherichia coli noted and urine culture.  5. Acute renal failure has resolved with holding ACE inhibitors and diuretics    She is quite anxious to go home today. Currently feels fine. Her atrial fibrillation rate is up some today since beta blockers were stopped yesterday.  I think it would be fine to send her home but she should have an early cardiology followup this week. Send home on amiodarone 400 mg twice daily and have her seen this week.  At the present time no diuretics or beta blockers. Hold ACE inhibitor.    Darden PalmerW. Spencer Theola Cuellar, Jr. MD [facility]

## 2014-03-06 NOTE — Evaluation (Addendum)
Physical Therapy Evaluation Patient Details Name: [name] MRN: [id number] DOB: 1956-02-12 Today's Date: 03/06/2014   History of Present Illness  58 y.o. female with PMH of HTN, CAD s/p CABG, CHF, COPD, h/o CVA, recently admitted by cardiology for PAF s/p DCCV (6/11) Presented with SOB, dizziness, palpitations, found in a fib RVR, and AKI, UTI  Clinical Impression  Patient evaluated by Physical Therapy with no further acute PT needs identified. All education has been completed and the patient has no further questions.    Per RN pt is to be discharged today, and is OK for dc home from PT standpoint;   See below for any follow-up Physial Therapy or equipment needs. PT is signing off. Thank you for this referral.     Follow Up Recommendations Home health PT Can potentially benefit form Outpt PT after HHPT course HHRN for chronic disease management    Equipment Recommendations  None recommended by PT    Recommendations for Other Services       Precautions / Restrictions Precautions Precaution Comments: Some decr balance, with noted pt reaching out fo rUE support; typically uses cane at home prn Restrictions Weight Bearing Restrictions: No      Mobility  Bed Mobility Overal bed mobility: Independent                Transfers Overall transfer level: Modified independent Equipment used:  (unilateral UE support on furniture)             General transfer comment: safe with UE assist  Ambulation/Gait Ambulation/Gait assistance: Supervision Ambulation Distance (Feet): 120 Feet Assistive device:  (unilateral UE support prn on hallway rail) Gait Pattern/deviations: Step-through pattern;Wide base of support Gait velocity: decr Gait velocity interpretation: <1.8 ft/sec, indicative of risk for recurrent falls General Gait Details: wide base of support with wide step width and decr translation of center of mass laterally over stance LE R and L; one sitting rest break  (post flight of steps)  Stairs Stairs: Yes Stairs assistance: Min guard Stair Management: Two rails;Alternating pattern;Forwards Number of Stairs: 12 General stair comments: Cues to self-monitor for activity tolerance; Noted dyspnea with exertion of steps  Wheelchair Mobility    Modified Rankin (Stroke Patients Only)       Balance Overall balance assessment: Needs assistance   Sitting balance-Leahy Scale: Good     Standing balance support: Single extremity supported Standing balance-Leahy Scale: Fair                               Pertinent Vitals/Pain Session conducted on [employer]; DOE 3/4; recovered with seated rest; HR max noted 120 with stairs    Home Living Family/patient expects to be discharged to:: Private residence Living Arrangements: Other (Comment) (2 housemates) Available Help at Discharge: Friend(s);Other (Comment) (housemates are largely independent of each other) Type of Home: House Home Access: Stairs to enter Entrance Stairs-Rails: None Entrance Stairs-Number of Steps: 4 Home Layout: Two level;Bed/bath upstairs Home Equipment: Cane - single point      Prior Function Level of Independence: Independent with assistive device(s)         Comments: cane prn     Hand Dominance        Extremity/Trunk Assessment   Upper Extremity Assessment: Overall WFL for tasks assessed (for simple tasks)           Lower Extremity Assessment: Overall WFL for tasks assessed (though noted some gait deviations)  Communication   Communication: No difficulties  Cognition Arousal/Alertness: Awake/alert Behavior During Therapy: WFL for tasks assessed/performed Overall Cognitive Status: Within Functional Limits for tasks assessed                      General Comments      Exercises        Assessment/Plan    PT Assessment All further PT needs can be met in the next venue of care  PT Diagnosis Difficulty walking   PT  Problem List Decreased activity tolerance;Decreased balance;Decreased coordination;Decreased knowledge of use of DME  PT Treatment Interventions     PT Goals (Current goals can be found in the Care Plan section) Acute Rehab PT Goals Patient Stated Goal: Back to driving PT Goal Formulation: No goals set, d/c therapy    Frequency     Barriers to discharge        Co-evaluation               End of Session Equipment Utilized During Treatment: Gait belt Activity Tolerance: Patient tolerated treatment well Patient left: in chair;with call bell/phone within reach Nurse Communication: Mobility status         Time: 7847-8412 PT Time Calculation (min): 26 min   Charges:   PT Evaluation $Initial PT Evaluation Tier I: 1 Procedure PT Treatments $Gait Training: 8-22 mins   PT G Codes:          Quin Hoop 03/06/2014, 9:51 AM  Roney Marion, [place] Pager [phone] Office [phone]

## 2014-03-06 NOTE — Progress Notes (Signed)
Pt. given discharge instruction. Verbalized understanding. Pt states her shirt is missing, called 2H, 2H unable to find. Called SW to get a shirt for discharge. SW was able to provide shirt. Discharged home via wheelchair.

## 2014-03-10 ENCOUNTER — Ambulatory Visit (INDEPENDENT_AMBULATORY_CARE_PROVIDER_SITE_OTHER): Payer: Medicare Other | Admitting: Cardiology

## 2014-03-10 ENCOUNTER — Encounter: Payer: Self-pay | Admitting: *Deleted

## 2014-03-10 ENCOUNTER — Encounter: Payer: Self-pay | Admitting: Cardiology

## 2014-03-10 VITALS — BP 145/111 | HR 123 | Ht 64.0 in | Wt 169.0 lb

## 2014-03-10 DIAGNOSIS — I5032 Chronic diastolic (congestive) heart failure: Secondary | ICD-10-CM | POA: Diagnosis not present

## 2014-03-10 DIAGNOSIS — I483 Typical atrial flutter: Secondary | ICD-10-CM

## 2014-03-10 DIAGNOSIS — I2510 Atherosclerotic heart disease of native coronary artery without angina pectoris: Secondary | ICD-10-CM | POA: Diagnosis not present

## 2014-03-10 DIAGNOSIS — I428 Other cardiomyopathies: Secondary | ICD-10-CM

## 2014-03-10 DIAGNOSIS — I4891 Unspecified atrial fibrillation: Secondary | ICD-10-CM

## 2014-03-10 DIAGNOSIS — I4892 Unspecified atrial flutter: Secondary | ICD-10-CM | POA: Insufficient documentation

## 2014-03-10 DIAGNOSIS — I429 Cardiomyopathy, unspecified: Secondary | ICD-10-CM

## 2014-03-10 MED ORDER — DILTIAZEM HCL ER COATED BEADS 120 MG PO CP24: 120.0000 mg | ORAL_CAPSULE | Freq: Every day | ORAL | Status: DC

## 2014-03-10 NOTE — Addendum Note (Signed)
Addended by: Lars Masson on: 03/10/2014 04:22 PM   Modules accepted: Orders

## 2014-03-10 NOTE — Patient Instructions (Signed)
Your physician has recommended you make the following change in your medication:   START TAKING CARDIZEM CD 120 MG PO DAILY    YOU ARE SCHEDULED FOR A CARDIOVERSION ON Monday March 14, 2014 AT 12 PM- YOU MUST ARRIVE AT THE [facility] AT 10:30 AM.    PLEASE CONTINUE TO TAKE ALL YOUR MEDICATIONS PRESCRIBED

## 2014-03-10 NOTE — Progress Notes (Signed)
Patient ID: [name], female   DOB: 02-06-1956, 58 y.o.   MRN: [id number]    Patient Name: CHANCI COPE Date of Encounter: 03/10/2014  Primary Care Provider:  [name], MD Primary Cardiologist:  Lars Masson  Problem List   Past Medical History  Diagnosis Date  . CAD (coronary artery disease)     a.  cath 09/2010: LAD stent patent, S-Int/dCFX ok, S-PDA ok, L-LAD atretic;  b. Lexiscan Myoview (02/2014):  no ischemia, EF 55%   . Hypertension   . Hx of CABG   . Hx of transesophageal echocardiography (TEE) for monitoring 11/2010    TEE 11/2010: EF 60-65%, BAE, trivial atrial septal shunt;  right heart cath in 10/2010 with elevated R and L heart pressures and diuretic started  . HLD (hyperlipidemia)   . Hypothyroidism   . Sleep apnea   . COPD (chronic obstructive pulmonary disease)   . Pulmonary nodules     repeat CT due in 11/2011  . Acute right MCA stroke 11/07/10  . 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 Eczema   . Depression   . Atrial fibrillation     a. s/p TEE-DCCV 02/2104; b. Xarelto started  . Cardiomyopathy with EF 40% at TEE 02/17/14 (likely tachycardia mediated - Myoview 02/19/14 neg for ischemia with normal EF) 02/18/2014   Past Surgical History  Procedure Laterality Date  . Coronary artery bypass graft    . Debriment for infection in chest    . Chest wall reconstruction    . Bilateral knee surgery    . Bladder surgery    . Left mastoidectomy    . Hernia repair    . Cath 2012    . Tee without cardioversion N/A 02/17/2014    Procedure: TRANSESOPHAGEAL ECHOCARDIOGRAM (TEE);  Surgeon: Pricilla Riffle, MD;  Location: [facility] ENDOSCOPY;  Service: Cardiovascular;  Laterality: N/A;  . Cardioversion N/A 02/17/2014    Procedure: CARDIOVERSION;  Surgeon: Pricilla Riffle, MD;  Location: [facility] ENDOSCOPY;  Service: Cardiovascular;  Laterality: N/A;    Allergies  Allergies  Allergen Reactions  . Amoxicillin Hives  . Avelox [Moxifloxacin Hcl In Nacl] Other  (See Comments)    Mental breakdown.  [name] Hydrocodone Itching  . Penicillins Hives  . Sulfa Antibiotics Other (See Comments)    unknown    HPI  58 year old female with prior medical history of coronary artery disease, tension, hyperlipidemia, stroke who is coming for concern of progressively worsening shortness of breath and orthopnea. The patient has a prior medical history of coronary artery bypass disease in 2008 and she was followed in our clinic by Dr. Riley Kill until approximately 20 years ago. She afterwards had a stroke became homeless and moved to [place].  She not get back to [facility] about 6 months ago and was seen in urgent care for a spider bite which she was told by a physician that her heartbeat are irregular. The patient mentions that over the last months she has noted progressively worsening shortness of breath, orthopnea and paroxysmal nocturnal dyspnea in the last week and inability to sleep. She is currently short of breath at rest and gasping for breath she has noticed that her heart rate has been elevated and on exertion could be as high as 180 beats per minute. She attributed this to smoking, so she quit just 3 days ago and is currently using nicotine patches. She denies any chest pain, syncope.  The patient was admitted  to the hospital and cardioverted on 02/17/2014. The patient was found to have mildly impaired left ventricle systolic function.  The patient was discharged on 02/21/2014 in sinus rhythm on Xarelto. The patient showed up in the ER again on June 26 with recurrent A. fib with RVR. She was symptomatic with shortness of breath and fatigue she was started on oral amiodarone. Because of her in her kidney function her Lasix also hold and lisinopril was held as well. She was euvolemic on the discharge day 03/06/2014. She was hypotensive during the hospital visit and therefore Cardizem was held to.   Home Medications  Prior to Admission medications   Medication Sig  Start Date End Date Taking? Authorizing Provider  ABILIFY 10 MG tablet TAKE 1 TABLET (10 MG TOTAL) BY MOUTH DAILY. 11/29/13  Yes Elvina Sidle, MD  aspirin EC 81 MG tablet Take 1 tablet (81 mg total) by mouth daily. 10/28/13  Yes Phillips Odor, MD  atorvastatin (LIPITOR) 40 MG tablet Take 1 tablet (40 mg total) by mouth daily. 11/16/13  Yes Elvina Sidle, MD  clopidogrel (PLAVIX) 75 MG tablet Take 1 tablet (75 mg total) by mouth daily. 11/16/13  Yes Elvina Sidle, MD  DULoxetine (CYMBALTA) 30 MG capsule Take 2 capsules (60 mg total) by mouth 2 (two) times daily. 11/16/13  Yes Elvina Sidle, MD  lamoTRIgine (LAMICTAL) 100 MG tablet Take 1 tablet (100 mg total) by mouth daily. 11/16/13  Yes Elvina Sidle, MD  levothyroxine (SYNTHROID, LEVOTHROID) 125 MCG tablet Take 1 tablet (125 mcg total) by mouth daily. 11/16/13  Yes Elvina Sidle, MD  lisinopril (PRINIVIL,ZESTRIL) 20 MG tablet TAKE 1 TABLET BY MOUTH DAILY. 11/29/13  Yes Elvina Sidle, MD  topiramate (TOPAMAX) 50 MG tablet Take 1 tablet (50 mg total) by mouth 2 (two) times daily. Start one tablet at night x 1 week then twice daily 01/03/14  Yes Micki Riley, MD  traMADol (ULTRAM) 50 MG tablet Take 2 tablets (100 mg total) by mouth every 6 (six) hours as needed for moderate pain. 01/03/14  Yes Micki Riley, MD    Family History  Family History  Problem Relation Age of Onset  . COPD Mother   . Heart disease Mother   . Arthritis Mother     Rheumatoid and PMR  . Heart attack Father   . Depression Father   . Hypertension Father   . Osteoporosis Mother     Mom fractured hip  . Diabetes type II Mother     Social History  History   Social History  . Marital Status: Divorced    Spouse Name: N/A    Number of Children: 2  . Years of Education: N/A   Occupational History  . DISABLE     Social History Main Topics  . Smoking status: Former Smoker -- 0.25 packs/day for 30 years    Types: Cigarettes    Quit date: 02/14/2014    . Smokeless tobacco: Never Used  . Alcohol Use: No  . Drug Use: No  . Sexual Activity: Not on file   Other Topics Concern  . Not on file   Social History Narrative   Divorced   After stroke was living with sister and mother after sister asked her to leave.   Difficulties over control.    Then moved back in for about 6 weeks.   . tranportaion difficult son  helping   Ex-smoker   Was living at friends  Sister and her had argument and she was  told to leave .       She is now living in a homeless shelter for the last 3 weeks [facility]    son had help with transportation and medication       Work status regular  before got sick on short-term disability now lost t insurance after the stroke and couldn't work was  denied ssi    2 times.  She is not eligible for Medicaid because she doesn't have dependent children.         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  Cardiovascular:  No chest pain, dyspnea on exertion, edema, orthopnea, palpitations, paroxysmal nocturnal dyspnea. Dermatological: No rash, lesions/masses Respiratory: No cough, dyspnea Urologic: No hematuria, dysuria Abdominal:   No nausea, vomiting, diarrhea, bright red blood per rectum, melena, or hematemesis Neurologic:  No visual changes, wkns, changes in mental status. All other systems reviewed and are otherwise negative except as noted above.  Physical Exam  Blood pressure 145/111, pulse 123, height 5\' 4"  (1.626 m), weight 169 lb (76.658 kg).  General: Pleasant, NAD Psych: Normal affect. Neuro: Alert and oriented X 3. Moves all extremities spontaneously. HEENT: Normal  Neck: Supple without bruits or JVD. Lungs:  Resp regular and unlabored, CTA. Heart: RRR no s3, s4, or murmurs. Abdomen: Soft, non-tender, non-distended, BS + x 4.  Extremities: No clubbing,  cyanosis or edema. DP/PT/Radials 2+ and equal bilaterally.  Labs:  No results found for this basename: CKTOTAL, CKMB, TROPONINI,  in the last 72 hours Lab Results  Component Value Date   WBC 3.3* 03/06/2014   HGB 11.1* 03/06/2014   HCT 32.8* 03/06/2014   MCV 90.9 03/06/2014   PLT 119* 03/06/2014       Component Value Date/Time   NA 143 03/06/2014 0430   K 3.7 03/06/2014 0430   CL 110 03/06/2014 0430   CO2 19 03/06/2014 0430   GLUCOSE 82 03/06/2014 0430   BUN 20 03/06/2014 0430   CREATININE 1.22* 03/06/2014 0430   CREATININE 1.12* 01/08/2014 1106   CALCIUM 8.0* 03/06/2014 0430   PROT 6.8 03/03/2014 1539   ALBUMIN 3.8 03/03/2014 1539   AST 11 03/03/2014 1539   ALT 9 03/03/2014 1539   ALKPHOS 72 03/03/2014 1539   BILITOT 0.4 03/03/2014 1539   GFRNONAA 49* 03/06/2014 0430   GFRAA 56* 03/06/2014 0430   Lab Results  Component Value Date   CHOL 237* 11/16/2013   HDL 68 11/16/2013   LDLCALC 152* 11/16/2013   TRIG 85 11/16/2013   Accessory Clinical Findings  Echocardiogram - 12/27/2009 Left ventricle: The cavity size was normal. Wall thickness was increased in a pattern of mild LVH. There was mild focal basal hypertrophy of the septum. Systolic function was normal. The estimated ejection fraction was in the range of 60% to 65%. Regional wall motion abnormalities cannot be excluded. Doppler parameters are consistent with abnormal left ventricular relaxation (grade 1 diastolic dysfunction). - Mitral valve: Calcified annulus. - Left atrium: The atrium was mildly dilated. - Right ventricle: The cavity size was mildly dilated. - Right atrium: The atrium was mildly dilated. Impressions:  - Technically difficult; LV function is vigorous; focal basal septal hypertrophy but no significant LVOT gradient at rest.  ECG - atrial fibrillation with rapid ventricular response, 116 beats per minute diffuse ST depressions in inferior anterolateral leads  Assessment & Plan  58 year old female  1. New  diagnosis of atrial flutter with variable block, ventricular rate 120s rate - patient was just discharged from the hospital, and doesn't want to go back for the Fourth of July weekend. We'll start her back on diltiazem 120 CD daily as her blood pressure allows it right now. We will bring her to the hospital on Monday, July 6 for DC cardioversion, no tea needed as she is anticoagulated with Xarelto.  2. Atrial Fibrillation: s/p successful TEE/ DCCV back to NSR. She continues in NS/SB with rates ~ 59-62. No BB/CCB at this time due to bradycardia. Will need to re-evaluate as an OP in 1-2 weeks. Continue Xarelto for stroke prophylaxis.   3. Chronic oral anticoagulation: on Xarelto. No signs of abnormal bleeding.   4. CAD: Denies CP. NST yesterday negative for ischemia. Continue ASA and statin. No BB at this time due to bradycardia.   5. Acute Combined Systolic + Diastolic CHF(EF 40%): status improved. Denies dyspnea. Appears euvolemic on physical exam. Continue PO Lasix + Potassium and ACE-I.   6. Hypokalemia: Resolved. Potassium was repleted. K is 3.7 today. Will need to continue daily supplemental K, in the setting of daily PO diuretic.   7. HTN: controlled.   8: HLD: Continue statin. She is on high dose Lipitor (80 mg). Last lipid panel was 11/16/13. LDL was not at goal, at 152 mg/dL. Goal in the setting of CAD is <70. Consider rechecking as an OP. If still not at goal, consider adding Zeita.     Lars MassonNELSON, Hobson Lax H, MD, North[facility] Va Medical CenterFACC 03/10/2014, 12:22 PM

## 2014-03-14 ENCOUNTER — Encounter (HOSPITAL_COMMUNITY)
Admission: RE | Disposition: A | Discharge status: Home / Self Care | Payer: Self-pay | Source: Ambulatory Visit | Attending: Cardiology

## 2014-03-14 ENCOUNTER — Encounter (HOSPITAL_COMMUNITY): Payer: Self-pay | Admitting: Anesthesiology

## 2014-03-14 ENCOUNTER — Ambulatory Visit (HOSPITAL_COMMUNITY)
Admission: RE | Admit: 2014-03-14 | Discharge: 2014-03-14 | Disposition: A | Discharge status: Home / Self Care | Payer: Medicare Other | Source: Ambulatory Visit | Attending: Cardiology | Admitting: Cardiology

## 2014-03-14 ENCOUNTER — Other Ambulatory Visit: Payer: Self-pay

## 2014-03-14 ENCOUNTER — Telehealth: Payer: Self-pay | Admitting: Neurology

## 2014-03-14 DIAGNOSIS — Z7901 Long term (current) use of anticoagulants: Secondary | ICD-10-CM | POA: Insufficient documentation

## 2014-03-14 DIAGNOSIS — I498 Other specified cardiac arrhythmias: Secondary | ICD-10-CM | POA: Insufficient documentation

## 2014-03-14 DIAGNOSIS — I4891 Unspecified atrial fibrillation: Secondary | ICD-10-CM | POA: Diagnosis not present

## 2014-03-14 DIAGNOSIS — Z5309 Procedure and treatment not carried out because of other contraindication: Secondary | ICD-10-CM | POA: Diagnosis not present

## 2014-03-14 SURGERY — CANCELLED PROCEDURE

## 2014-03-14 MED ORDER — SODIUM CHLORIDE 0.9 % IV SOLN: INTRAVENOUS | Status: DC

## 2014-03-14 NOTE — Anesthesia Preprocedure Evaluation (Deleted)
Anesthesia Evaluation  Patient identified by MRN, date of birth, ID band Patient awake    Reviewed: Allergy & Precautions, H&P , NPO status , Patient's Chart, lab work & pertinent test results, reviewed documented beta blocker date and time   Airway Mallampati: II TM Distance: >3 FB Neck ROM: full    Dental   Pulmonary asthma , sleep apnea , COPDformer smoker,  breath sounds clear to auscultation        Cardiovascular hypertension, + CAD and + CABG Atrial Fibrillation Rhythm:regular     Neuro/Psych PSYCHIATRIC DISORDERS CVA    GI/Hepatic negative GI ROS, Neg liver ROS,   Endo/Other  negative endocrine ROSHypothyroidism   Renal/GU Renal disease  negative genitourinary   Musculoskeletal   Abdominal   Peds  Hematology negative hematology ROS (+)   Anesthesia Other Findings See surgeon's H&P   Reproductive/Obstetrics negative OB ROS                           Anesthesia Physical Anesthesia Plan  ASA: III  Anesthesia Plan: General   Post-op Pain Management:    Induction: Intravenous  Airway Management Planned: Mask  Additional Equipment:   Intra-op Plan:   Post-operative Plan:   Informed Consent: I have reviewed the patients History and Physical, chart, labs and discussed the procedure including the risks, benefits and alternatives for the proposed anesthesia with the patient or authorized representative who has indicated his/her understanding and acceptance.   Dental Advisory Given  Plan Discussed with: CRNA and Surgeon  Anesthesia Plan Comments:         Anesthesia Quick Evaluation

## 2014-03-14 NOTE — Telephone Encounter (Signed)
Patient calling to schedule an appointment with Dr. Pearlean Brownie soon, states that she is having problems staying awake during the day. Please return call to patient and advise.

## 2014-03-14 NOTE — H&P (Signed)
The patient presented in sinus bradycardia. No cardioversion was performed. Follow up in the office in 2-3 weeks. Continue Xarelto.  Lars Masson 03/14/2014

## 2014-03-17 NOTE — Telephone Encounter (Signed)
Called pt to set up appt with Dr. Pearlean Brownie on 03/28/14. I advised the pt the if she has any other problems, questions or concerns to call the office. Pt verbalized understanding.

## 2014-03-18 ENCOUNTER — Encounter: Payer: Self-pay | Admitting: Internal Medicine

## 2014-03-18 ENCOUNTER — Ambulatory Visit (INDEPENDENT_AMBULATORY_CARE_PROVIDER_SITE_OTHER): Payer: Medicare Other | Admitting: Internal Medicine

## 2014-03-18 VITALS — BP 142/88 | HR 62 | Temp 98.7°F | Ht 63.0 in | Wt 168.0 lb

## 2014-03-18 DIAGNOSIS — I4891 Unspecified atrial fibrillation: Secondary | ICD-10-CM

## 2014-03-18 DIAGNOSIS — I25810 Atherosclerosis of coronary artery bypass graft(s) without angina pectoris: Secondary | ICD-10-CM

## 2014-03-18 DIAGNOSIS — Z114 Encounter for screening for human immunodeficiency virus [HIV]: Secondary | ICD-10-CM

## 2014-03-18 DIAGNOSIS — I2789 Other specified pulmonary heart diseases: Secondary | ICD-10-CM

## 2014-03-18 DIAGNOSIS — Z1159 Encounter for screening for other viral diseases: Secondary | ICD-10-CM | POA: Diagnosis not present

## 2014-03-18 DIAGNOSIS — I428 Other cardiomyopathies: Secondary | ICD-10-CM

## 2014-03-18 DIAGNOSIS — Z23 Encounter for immunization: Secondary | ICD-10-CM | POA: Diagnosis not present

## 2014-03-18 DIAGNOSIS — I10 Essential (primary) hypertension: Secondary | ICD-10-CM

## 2014-03-18 DIAGNOSIS — I272 Pulmonary hypertension, unspecified: Secondary | ICD-10-CM

## 2014-03-18 DIAGNOSIS — F17200 Nicotine dependence, unspecified, uncomplicated: Secondary | ICD-10-CM | POA: Diagnosis not present

## 2014-03-18 DIAGNOSIS — I2510 Atherosclerotic heart disease of native coronary artery without angina pectoris: Secondary | ICD-10-CM

## 2014-03-18 DIAGNOSIS — F39 Unspecified mood [affective] disorder: Secondary | ICD-10-CM

## 2014-03-18 DIAGNOSIS — Z79899 Other long term (current) drug therapy: Secondary | ICD-10-CM

## 2014-03-18 DIAGNOSIS — Z8673 Personal history of transient ischemic attack (TIA), and cerebral infarction without residual deficits: Secondary | ICD-10-CM

## 2014-03-18 DIAGNOSIS — D649 Anemia, unspecified: Secondary | ICD-10-CM | POA: Diagnosis not present

## 2014-03-18 DIAGNOSIS — I5032 Chronic diastolic (congestive) heart failure: Secondary | ICD-10-CM

## 2014-03-18 DIAGNOSIS — J438 Other emphysema: Secondary | ICD-10-CM

## 2014-03-18 DIAGNOSIS — I429 Cardiomyopathy, unspecified: Secondary | ICD-10-CM

## 2014-03-18 LAB — CBC WITH DIFFERENTIAL/PLATELET
BASOS PCT: 0.5 % (ref 0.0–3.0)
Basophils Absolute: 0 10*3/uL (ref 0.0–0.1)
EOS ABS: 0.2 10*3/uL (ref 0.0–0.7)
Eosinophils Relative: 3.1 % (ref 0.0–5.0)
HCT: 37 % (ref 36.0–46.0)
HEMOGLOBIN: 12.5 g/dL (ref 12.0–15.0)
LYMPHS PCT: 24.8 % (ref 12.0–46.0)
Lymphs Abs: 1.3 10*3/uL (ref 0.7–4.0)
MCHC: 33.7 g/dL (ref 30.0–36.0)
MCV: 93.5 fl (ref 78.0–100.0)
Monocytes Absolute: 0.4 10*3/uL (ref 0.1–1.0)
Monocytes Relative: 8.6 % (ref 3.0–12.0)
NEUTROS ABS: 3.3 10*3/uL (ref 1.4–7.7)
Neutrophils Relative %: 63 % (ref 43.0–77.0)
Platelets: 239 10*3/uL (ref 150.0–400.0)
RBC: 3.96 Mil/uL (ref 3.87–5.11)
RDW: 12.8 % (ref 11.5–15.5)
WBC: 5.2 10*3/uL (ref 4.0–10.5)

## 2014-03-18 LAB — BASIC METABOLIC PANEL
BUN: 14 mg/dL (ref 6–23)
CO2: 25 mEq/L (ref 19–32)
CREATININE: 1.1 mg/dL (ref 0.4–1.2)
Calcium: 8.1 mg/dL — ABNORMAL LOW (ref 8.4–10.5)
Chloride: 103 mEq/L (ref 96–112)
GFR: 53.95 mL/min — ABNORMAL LOW (ref >60.00)
Glucose, Bld: 72 mg/dL (ref 70–99)
Potassium: 3.4 mEq/L — ABNORMAL LOW (ref 3.5–5.1)
Sodium: 138 mEq/L (ref 135–145)

## 2014-03-18 NOTE — Patient Instructions (Addendum)
We need help for your medication management.  Psychiatry   Uncertain what to use instead of cymbalta .  Try [facility] outpatient intake [phone] request medication evaluation  And tell them PCP name and office . Dr Donell Beers  Also . Will notify you  of labs when available. Continue stop tobacco. ROV in 2 months or as needed   Ask dr Delton See about the cholesterol meds .

## 2014-03-18 NOTE — Progress Notes (Signed)
Pre visit review using our clinic review tool, if applicable. No additional management support is needed unless otherwise documented below in the visit note.  Chief Complaint  Patient presents with  . Hospitalization Follow-up    HPI: Patient comes in today for follow up hosptialization for cardiology reasons of   Last seen in our practice  May of 2013  And moved out of area when homeless  [facility]   In the last  6 monmonths  . Didn't realize she had medicare coverage and finally go ssd   Here to reestablish care also .[name].    Back from sisters house     recedntly admit to hosp for sob that was from new onset afib.  That self resolved. Now on anticoagulation with xeralto and on amiodarone .   Had uti treated e coli Tobacco  Relapse when homeless and qutitting  3-5 x  On patch.   Finally  Got DSS  .  [place] [employer]  Driving now back  Has licence  .  Went through OT      CAD s/p cabg  NO new sx  S/P cva  Some lef weakness   Doing ok no  Progression no new  Sx no recently falling  PSYCH: now on meds  Had psych prev prescribe meds but since now on medicare needs new psych asks for advice  On abilify lamictal  alos on topamax was on cymbalta but "insurance dosent cover so currently not taking) Below synopsis of hospitalization Admit date: 03/03/2014  Discharge date: 03/06/2014  Time spent: >35 minutes  Recommendations for Outpatient Follow-up:  F/u with PCP next week  F/u with cardiology in 2-3 days  Discharge Diagnoses:  Principal Problem:  Atrial fibrillation with RVR  Active Problems:  AKI (acute kidney injury)  Hypotension  Long-term (current) use of anticoagulants  Discharge Condition: stable  Diet recommendation: DM, low sodium  Filed Weights    03/05/14 0500  03/05/14 1730  03/06/14 0601   Weight:  76.749 kg (169 lb 3.2 oz)  77.701 kg (171 lb 4.8 oz)  78.109 kg (172 lb 3.2 oz)   Hospital Course:  1. A fib RVR; started on PO amio  per cardiology, HR improved; off Cardizem per cardiology; hemodynamically stable;  -resume xarelto per pharmacy; BB on hold due to low BP; appreciate the input  -s/p recent DCCV (6/11); cardiology following  2. AKI likely ATN/hypovolemia on diuretics, on top of ACE  -renal US: Normal ultrasound appearance of the kidneys and bladder, UA + infection  -BMP is improved after holding ACE, diuretic; +gentle IVF  -hold ACE; clinically euvolemic; resume lasix upon d/c 20 QD; BMP next week  3. Chronic systolic CHF;  -euvolemic, hold ACE due to renal falure; resume lasix 20 QD, adjust as needed as OP next week  4. CAD s/p CABG; denies chest pain; cont statin  5. Hypothyroidism, recheck tsh-1.5; cont levothyroxine  6. COPD, Pt reports stopping smoking this month; no wheezing on exam; cont inhaler prn  7. UTI, start levofloxacin; afebrile, no leukocytosis, symptoms improved; (Pt reports rash to amoxicillin, but anxiety, irritation to levofloxacin)  -complete levofloxacin OP; monitor  -cutures E coli pend sens; Pt wants to go home and f/u cultures with PCP on Monday  8. Hypotension, likely in the setting of dehydration, AKI,  -improved;    Health Maintenance  Topic Date Due  . Tetanus/tdap  10/04/1976  . Colonoscopy  10/05/2007  . Pap  Smear  05/08/2012  . Mammogram  07/09/2013  . Influenza Vaccine  04/09/2014   Health Maintenance Review No etoh  Bf from home had done ivd  And concern about upicin past no sx   ROS:  GEN/ HEENT: No fever, significant weight changes sweats vision problems better from stroke hearing changes,no recent falling  Has stable CV/ PULM; No chest pain shortness of breath cough, syncope,edema  change in exercise tolerance. GI /GU: No adominal pain, vomiting, change in bowel habits. No blood in the stool. No significant GU symptoms. SKIN/HEME: ,no acute skin rashes suspicious lesions or bleeding. No lymphadenopathy, nodules, masses. Bruise easy on xeralto NEURO/ PSYCH:   Residual cva  Ambulatory independent better no tremor cognition improved .from last 2 years sleepy a lot  Neg etoh some mj ocass  IMM/ Allergy: No unusual infections.  Allergy .   REST of 12 system review negative except as per HPI   Past Medical History  Diagnosis Date  . CAD (coronary artery disease)     a.  cath 09/2010: LAD stent patent, S-Int/dCFX ok, S-PDA ok, L-LAD atretic;  b. Lexiscan Myoview (02/2014):  no ischemia, EF 55%   . Hypertension   . Hx of CABG   . Hx of transesophageal echocardiography (TEE) for monitoring 11/2010    TEE 11/2010: EF 60-65%, BAE, trivial atrial septal shunt;  right heart cath in 10/2010 with elevated R and L heart pressures and diuretic started  . HLD (hyperlipidemia)   . Hypothyroidism   . Sleep apnea   . COPD (chronic obstructive pulmonary disease)   . Pulmonary nodules     repeat CT due in 11/2011  . Acute right MCA stroke 11/07/10  . 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 Eczema   . Depression   . Atrial fibrillation     a. s/p TEE-DCCV 02/2104; b. Xarelto started  . Cardiomyopathy with EF 40% at TEE 02/17/14 (likely tachycardia mediated - Myoview 02/19/14 neg for ischemia with normal EF) 02/20/14   Mom passed away last year [name] had been our pt in past had sig copd and also pmr and anxiety ht Family History  Problem Relation Age of Onset  . COPD Mother   . Heart disease Mother   . Arthritis Mother     Rheumatoid and PMR  . Heart attack Father   . Depression Father   . Hypertension Father   . Osteoporosis Mother     Mom fractured hip  . Diabetes type II Mother     History   Social History  . Marital Status: Divorced    Spouse Name: N/A    Number of Children: 2  . Years of Education: N/A   Occupational History  . DISABLE     Social History Main Topics  . Smoking status: Former Smoker -- 0.25 packs/day for 30 years    Types: Cigarettes    Quit date: 02/14/2014  . Smokeless tobacco: Never Used  . Alcohol Use: No    . Drug Use: No  . Sexual Activity: None   Other Topics Concern  . None   Social History Narrative   Divorced   After stroke was living with sister and mother after sister asked her to leave.   Difficulties over control.    Then moved back in for about 6 weeks.   . tranportaion difficult son  helping   Ex-smoker   Was living at friends  Sister and her had  argument and she was told to leave .       She is now living in a homeless shelter for the last 3 weeks [facility]    son had help with transportation and medication       Work status regular  before got sick on short-term disability now lost t insurance after the stroke and couldn't work was  denied ssi    2 times.  She is not eligible for Medicaid because she doesn't have dependent children.         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 albuterol (PROVENTIL HFA;VENTOLIN HFA) 108 (90 BASE) MCG/ACT inhaler Inhale 2 puffs into the lungs every 6 (six) hours as needed for wheezing or shortness of breath.  [name] amiodarone (PACERONE) 400 MG tablet Take 1 tablet (400 mg total) by mouth 2 (two) times daily.  . ARIPiprazole (ABILIFY) 10 MG tablet Take 10 mg by mouth daily.  [name] atorvastatin (LIPITOR) 80 MG tablet Take 0.5 tablets (40 mg total) by mouth daily.  [name] diltiazem (CARDIZEM CD) 120 MG 24 hr capsule Take 1 capsule (120 mg total) by mouth daily.  . furosemide (LASIX) 40 MG tablet Take 0.5 tablets (20 mg total) by mouth daily.  [name] lamoTRIgine (LAMICTAL) 100 MG tablet Take 1 tablet (100 mg total) by mouth daily.  [name] levothyroxine (SYNTHROID, LEVOTHROID) 125 MCG tablet Take 1 tablet (125 mcg total) by mouth daily.  . potassium chloride SA (K-DUR,KLOR-CON) 20 MEQ tablet Take 1 tablet (20 mEq total) by mouth 2 (two) times daily.  . rivaroxaban (XARELTO) 20 MG TABS tablet Take 1 tablet (20 mg total) by mouth daily with supper.  .  topiramate (TOPAMAX) 50 MG tablet Take 50 mg by mouth 2 (two) times daily.  . traMADol (ULTRAM) 50 MG tablet Take 2 tablets (100 mg total) by mouth every 6 (six) hours as needed for moderate pain.  . [DISCONTINUED] topiramate (TOPAMAX) 50 MG tablet Take 1 tablet (50 mg total) by mouth 2 (two) times daily. Start one tablet at night x 1 week then twice daily  . [DISCONTINUED] DULoxetine (CYMBALTA) 30 MG capsule Take 2 capsules (60 mg total) by mouth 2 (two) times daily.  . [DISCONTINUED] levofloxacin (LEVAQUIN) 250 MG tablet Take 1 tablet (250 mg total) by mouth daily.    EXAM:  BP 142/88  Pulse 62  Temp(Src) 98.7 F (37.1 C) (Oral)  Ht 5\' 3"  (1.6 m)  Wt 168 lb (76.204 kg)  BMI 29.77 kg/m2  SpO2 99%  Body mass index is 29.77 kg/(m^2).  Physical Exam: Vital signs reviewed RUE:AVWU is a well-developed well-nourished alert cooperative    who appearsr stated age in no acute distress. Monotone speech but normal insighgt and converstaion non toxi  HEENT: normocephalic atraumatic , Eyes: PERRL EOM's full, conjunctiva clear, Nares: paten,t no deformity discharge or tenderness., Ears: no deformity EAC's clear TMs with normal landmarks. OP, no lesions, edema.  Moist mucous membranes. Dentition in adequate repair. NECK: supple without masses, thyromegaly or bruits. CHEST/PULM:  Clear to auscultation and percussion breath sounds equal no wheeze , rales or rhonchi. Well healed midline scar CV: PMI is nondisplaced, S1 S2 no gallops, murmurs,RR rubs. Peripheral pulses are present no JVD . ABDOMEN: Bowel sounds normal nontender  No guard or rebound, no hepato splenomegal no CVA tenderness. . Extremtities:  No clubbing cyanosis or edema, no acute joint swelling or  redness no focal atrophy NEURO:  Oriented x3, cranial nerves 3-12 appear to be intact,  No tremor some cogwheeling  Some left sided weakness  But can raise arms above head without resistance SKIN: No acute rashes normal turgor, color, bruise left  forarm or petechiae. PSYCH: Oriented, good eye contact, no obvious depression anxiety, cognition and judgment appear normal. LN: no cervical  adenopathy  Lab Results  Component Value Date   WBC 5.2 03/18/2014   HGB 12.5 03/18/2014   HCT 37.0 03/18/2014   PLT 239.0 03/18/2014   GLUCOSE 72 03/18/2014   CHOL 237* 11/16/2013   TRIG 85 11/16/2013   HDL 68 11/16/2013   LDLDIRECT 153.0 01/15/2010   LDLCALC 152* 11/16/2013   ALT 9 03/03/2014   AST 11 03/03/2014   NA 138 03/18/2014   K 3.4* 03/18/2014   CL 103 03/18/2014   CREATININE 1.1 03/18/2014   BUN 14 03/18/2014   CO2 25 03/18/2014   TSH 1.520 03/03/2014   INR 1.20 02/16/2014   HGBA1C  Value: 5.4 (NOTE)                                                                       According to the ADA Clinical Practice Recommendations for 2011, when HbA1c is used as a screening test:   >=6.5%   Diagnostic of Diabetes Mellitus           (if abnormal result  is confirmed)  5.7-6.4%   Increased risk of developing Diabetes Mellitus  References:Diagnosis and Classification of Diabetes Mellitus,Diabetes Care,2011,34(Suppl 1):S62-S69 and Standards of Medical Care in         Diabetes - 2011,Diabetes Care,2011,34  (Suppl 1):S11-S61. 02/11/2011    ASSESSMENT AND PLAN:  Discussed the following assessment and plan:  Anemia, unspecified - Plan: HIV antibody, Hepatitis C antibody, CBC with Differential, Basic metabolic panel  Screening for HIV (human immunodeficiency virus) - Plan: HIV antibody, Hepatitis C antibody, CBC with Differential, Basic metabolic panel  Unspecified essential hypertension - Plan: HIV antibody, Hepatitis C antibody, CBC with Differential, Basic metabolic panel  Tobacco use disorder - pt plans on stopping  has done this before relapse with homelessness  High risk medication use  Need for pneumococcal vaccination - Plan: Pneumococcal conjugate vaccine 13-valent  Chronic diastolic heart failure  Coronary artery disease involving autologous vein  coronary bypass graft without angina pectoris  History of right MCA stroke  Other emphysema  Pulmonary hypertension  Cardiomyopathy with EF 40% at TEE 02/17/14 (likely tachycardia mediated - Myoview 02/19/14 neg for ischemia with normal EF)  Atrial fibrillation with RVR  Mood disorder address PV issues at fu prevnar 13 today Patient Care Team: Madelin Headings, MD as PCP - General Gladstone Pih, PsyD (Psychiatry) Waymon Budge, MD (Pulmonary Disease) Lars Masson, MD as Consulting Physician (Cardiology) Elvina Sidle, MD as Consulting Physician [facility] Medicine) Patient Instructions  We need help for your medication management.  Psychiatry   Uncertain what to use instead of cymbalta .  Try [facility] outpatient intake [phone] request medication evaluation  And tell them PCP name and office . Dr Donell Beers  Also . Will notify you  of labs when available. Continue stop tobacco. ROV in 2 months or as needed  Ask dr Delton See about the cholesterol meds .     Neta Mends. Signa Cheek M.D.  Total visit > 50% spent counseling and coordinating care regarding reestablishing care and multiple medical issues and risks   Tobacco cessation important . Counseled. Healthy lifes tyle changes and helps  Dennie Bible does have internet

## 2014-03-19 LAB — HEPATITIS C ANTIBODY: HCV Ab: NEGATIVE

## 2014-03-19 LAB — HIV ANTIBODY (ROUTINE TESTING W REFLEX): HIV 1&2 Ab, 4th Generation: NONREACTIVE

## 2014-03-20 DIAGNOSIS — I10 Essential (primary) hypertension: Secondary | ICD-10-CM | POA: Insufficient documentation

## 2014-03-20 DIAGNOSIS — F17200 Nicotine dependence, unspecified, uncomplicated: Secondary | ICD-10-CM | POA: Insufficient documentation

## 2014-03-20 DIAGNOSIS — D649 Anemia, unspecified: Secondary | ICD-10-CM | POA: Insufficient documentation

## 2014-03-20 DIAGNOSIS — Z79899 Other long term (current) drug therapy: Secondary | ICD-10-CM | POA: Insufficient documentation

## 2014-03-20 DIAGNOSIS — F39 Unspecified mood [affective] disorder: Secondary | ICD-10-CM | POA: Insufficient documentation

## 2014-03-21 ENCOUNTER — Ambulatory Visit (INDEPENDENT_AMBULATORY_CARE_PROVIDER_SITE_OTHER): Payer: Medicare Other | Admitting: Family Medicine

## 2014-03-21 ENCOUNTER — Telehealth: Payer: Self-pay | Admitting: Internal Medicine

## 2014-03-21 VITALS — BP 122/78 | HR 62 | Temp 97.4°F | Resp 16 | Ht 63.0 in | Wt 167.0 lb

## 2014-03-21 DIAGNOSIS — I2510 Atherosclerotic heart disease of native coronary artery without angina pectoris: Secondary | ICD-10-CM | POA: Diagnosis not present

## 2014-03-21 DIAGNOSIS — Z113 Encounter for screening for infections with a predominantly sexual mode of transmission: Secondary | ICD-10-CM | POA: Diagnosis not present

## 2014-03-21 DIAGNOSIS — Z124 Encounter for screening for malignant neoplasm of cervix: Secondary | ICD-10-CM

## 2014-03-21 LAB — POCT WET PREP WITH KOH
Clue Cells Wet Prep HPF POC: NEGATIVE
KOH Prep POC: NEGATIVE
RBC Wet Prep HPF POC: NEGATIVE
Trichomonas, UA: NEGATIVE
Yeast Wet Prep HPF POC: NEGATIVE

## 2014-03-21 NOTE — Telephone Encounter (Signed)
Relevant patient education assigned to patient using Emmi. ° °

## 2014-03-21 NOTE — Progress Notes (Signed)
 Chief Complaint:  Chief Complaint  Patient presents with  . Annual Exam    with pap  std check.         HPI: [name] is a 58 y.o. female who is here for  Pap only  Annual exam was 5 years ago with Dr Fabian Sharp, she recently moved back to the area Last pap was 2010, no prior abnormal paps LMP January 2008 Last mamogram was 2012.  She sees Dr. Fabian Sharp as her PCP. She saw her yesterday for Hep C and HIV since that was all she as worried about since he is an IVDA G2L2   Past Medical History  Diagnosis Date  . CAD (coronary artery disease)     a.  cath 09/2010: LAD stent patent, S-Int/dCFX ok, S-PDA ok, L-LAD atretic;  b. Lexiscan Myoview (02/2014):  no ischemia, EF 55%   . Hypertension   . Hx of CABG   . Hx of transesophageal echocardiography (TEE) for monitoring 11/2010    TEE 11/2010: EF 60-65%, BAE, trivial atrial septal shunt;  right heart cath in 10/2010 with elevated R and L heart pressures and diuretic started  . HLD (hyperlipidemia)   . Hypothyroidism   . Sleep apnea   . COPD (chronic obstructive pulmonary disease)   . Pulmonary nodules     repeat CT due in 11/2011  . Acute right MCA stroke 11/07/10  . 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 Eczema   . Depression   . Atrial fibrillation     a. s/p TEE-DCCV 02/2104; b. Xarelto started  . Cardiomyopathy with EF 40% at TEE 02/17/14 (likely tachycardia mediated - Myoview 02/19/14 neg for ischemia with normal EF) 02/18/2014   Past Surgical History  Procedure Laterality Date  . Coronary artery bypass graft    . Debriment for infection in chest    . Chest wall reconstruction    . Bilateral knee surgery    . Bladder surgery    . Left mastoidectomy    . Hernia repair    . Cath 2012    . Tee without cardioversion N/A 02/17/2014    Procedure: TRANSESOPHAGEAL ECHOCARDIOGRAM (TEE);  Surgeon: Pricilla Riffle, MD;  Location: [facility] ENDOSCOPY;  Service: Cardiovascular;  Laterality: N/A;  . Cardioversion N/A 02/17/2014      Procedure: CARDIOVERSION;  Surgeon: Pricilla Riffle, MD;  Location: [facility] ENDOSCOPY;  Service: Cardiovascular;  Laterality: N/A;   History   Social History  . Marital Status: Divorced    Spouse Name: N/A    Number of Children: 2  . Years of Education: N/A   Occupational History  . DISABLE     Social History Main Topics  . Smoking status: Current Every Day Smoker -- 0.25 packs/day for 30 years    Types: Cigarettes    Last Attempt to Quit: 02/14/2014  . Smokeless tobacco: Never Used  . Alcohol Use: No  . Drug Use: No  . Sexual Activity: None   Other Topics Concern  . None   Social History Narrative   Divorced   After stroke was living with sister and mother after sister asked her to leave.   Difficulties over control.    Then moved back in for about 6 weeks.   . tranportaion difficult son  helping   Ex-smoker   Was living at friends  Sister and her had argument and she was told to leave .  She is now living in a homeless shelter for the last 3 weeks [facility]    son had help with transportation and medication       Work status regular  before got sick on short-term disability now lost t insurance after the stroke and couldn't work was  denied ssi    2 times.  She is not eligible for Medicaid because she doesn't have dependent children.         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 COPD Mother   . Heart disease Mother   . Arthritis Mother     Rheumatoid and PMR  . Heart attack Father   . Depression Father   . Hypertension Father   . Osteoporosis Mother     Mom fractured hip  . Diabetes type II Mother    Allergies  Allergen Reactions  . Amoxicillin Hives  . Avelox [Moxifloxacin Hcl In Nacl] Other (See Comments)    Mental breakdown.  [name]. Hydrocodone Itching  . Penicillins Hives  . Sulfa Antibiotics Other (See Comments)    unknown   Prior to Admission medications    Medication Sig Start Date End Date Taking? Authorizing Provider  albuterol (PROVENTIL HFA;VENTOLIN HFA) 108 (90 BASE) MCG/ACT inhaler Inhale 2 puffs into the lungs every 6 (six) hours as needed for wheezing or shortness of breath. 03/06/14  Yes Esperanza SheetsUlugbek N Buriev, MD  amiodarone (PACERONE) 400 MG tablet Take 1 tablet (400 mg total) by mouth 2 (two) times daily. 03/06/14  Yes Esperanza SheetsUlugbek N Buriev, MD  ARIPiprazole (ABILIFY) 10 MG tablet Take 10 mg by mouth daily.   Yes Historical Provider, MD  atorvastatin (LIPITOR) 80 MG tablet Take 0.5 tablets (40 mg total) by mouth daily. 02/20/14  Yes Brittainy Sharol HarnessSimmons, PA-C  diltiazem (CARDIZEM CD) 120 MG 24 hr capsule Take 1 capsule (120 mg total) by mouth daily. 03/10/14  Yes Lars MassonKatarina H Nelson, MD  furosemide (LASIX) 40 MG tablet Take 0.5 tablets (20 mg total) by mouth daily. 03/06/14  Yes Esperanza SheetsUlugbek N Buriev, MD  lamoTRIgine (LAMICTAL) 100 MG tablet Take 1 tablet (100 mg total) by mouth daily. 11/16/13  Yes Elvina SidleKurt Lauenstein, MD  levothyroxine (SYNTHROID, LEVOTHROID) 125 MCG tablet Take 1 tablet (125 mcg total) by mouth daily. 11/16/13  Yes Elvina SidleKurt Lauenstein, MD  potassium chloride SA (K-DUR,KLOR-CON) 20 MEQ tablet Take 1 tablet (20 mEq total) by mouth 2 (two) times daily. 02/20/14  Yes Brittainy Sharol HarnessSimmons, PA-C  rivaroxaban (XARELTO) 20 MG TABS tablet Take 1 tablet (20 mg total) by mouth daily with supper. 02/20/14  Yes Brittainy Sharol HarnessSimmons, PA-C  topiramate (TOPAMAX) 50 MG tablet Take 50 mg by mouth 2 (two) times daily. 01/03/14  Yes Delia HeadyPramod Sethi, MD  traMADol (ULTRAM) 50 MG tablet Take 2 tablets (100 mg total) by mouth every 6 (six) hours as needed for moderate pain. 01/03/14  Yes Delia HeadyPramod Sethi, MD     ROS: The patient denies fevers, chills, night sweats, unintentional weight loss, chest pain, palpitations, wheezing, dyspnea on exertion, nausea, vomiting, abdominal pain, dysuria, hematuria, melena, acute numbness, weakness, or tingling.   All other systems have been reviewed and were  otherwise negative with the exception of those mentioned in the HPI and as above.    PHYSICAL EXAM: Filed Vitals:   03/21/14 1314  BP: 122/78  Pulse: 62  Temp: 97.4 F (36.3 C)  Resp: 16   Filed Vitals:  03/21/14 1314  Height: 5\' 3"  (1.6 m)  Weight: 167 lb (75.751 kg)   Body mass index is 29.59 kg/(m^2).  General: Alert, no acute distress HEENT:  Normocephalic, atraumatic, oropharynx patent. EOMI, PERRLA Cardiovascular:  Regular rate and rhythm, no rubs murmurs or gallops.  No Carotid bruits, radial pulse intact. No pedal edema.  Respiratory: Clear to auscultation bilaterally.  No wheezes, rales, or rhonchi.  No cyanosis, no use of accessory musculature GI: No organomegaly, abdomen is soft and non-tender, positive bowel sounds.  No masses. Skin: No rashes. Neurologic: Facial musculature symmetric. Psychiatric: Patient is appropriate throughout our interaction. Lymphatic: No cervical lymphadenopathy Musculoskeletal: Gait intact but slowed GU-cerivx is normal NO CMT, rashes + vaginal dc, white but no odor   LABS: Results for orders placed in visit on 03/18/14  HIV ANTIBODY (ROUTINE TESTING)      Result Value Ref Range   HIV 1&2 Ab, 4th Generation NONREACTIVE  NONREACTIVE  HEPATITIS C ANTIBODY      Result Value Ref Range   HCV Ab NEGATIVE  NEGATIVE  CBC WITH DIFFERENTIAL      Result Value Ref Range   WBC 5.2  4.0 - 10.5 K/uL   RBC 3.96  3.87 - 5.11 Mil/uL   Hemoglobin 12.5  12.0 - 15.0 g/dL   HCT 45.8  09.9 - 83.3 %   MCV 93.5  78.0 - 100.0 fl   MCHC 33.7  30.0 - 36.0 g/dL   RDW 82.5  05.3 - 97.6 %   Platelets 239.0  150.0 - 400.0 K/uL   Neutrophils Relative % 63.0  43.0 - 77.0 %   Lymphocytes Relative 24.8  12.0 - 46.0 %   Monocytes Relative 8.6  3.0 - 12.0 %   Eosinophils Relative 3.1  0.0 - 5.0 %   Basophils Relative 0.5  0.0 - 3.0 %   Neutro Abs 3.3  1.4 - 7.7 K/uL   Lymphs Abs 1.3  0.7 - 4.0 K/uL   Monocytes Absolute 0.4  0.1 - 1.0 K/uL   Eosinophils  Absolute 0.2  0.0 - 0.7 K/uL   Basophils Absolute 0.0  0.0 - 0.1 K/uL  BASIC METABOLIC PANEL      Result Value Ref Range   Sodium 138  135 - 145 mEq/L   Potassium 3.4 (*) 3.5 - 5.1 mEq/L   Chloride 103  96 - 112 mEq/L   CO2 25  19 - 32 mEq/L   Glucose, Bld 72  70 - 99 mg/dL   BUN 14  6 - 23 mg/dL   Creatinine, Ser 1.1  0.4 - 1.2 mg/dL   Calcium 8.1 (*) 8.4 - 10.5 mg/dL   GFR 73.41 (*) >93.79 mL/min     EKG/XRAY:   Primary read interpreted by Dr. Conley Rolls at [facility].   ASSESSMENT/PLAN: Encounter Diagnoses  Name Primary?  . Pap smear for cervical cancer screening Yes  . Screening for STD (sexually transmitted disease)    58 y/o female her for pap ony and also screening for STD since worried about STD infection Pap 3 with G/C STD screening labs pending, no HIV since recently done F/u prn  Gross sideeffects, risk and benefits, and alternatives of medications d/w patient. Patient is aware that all medications have potential sideeffects and we are unable to predict every sideeffect or drug-drug interaction that may occur.  ,  PHUONG, DO 03/21/2014 2:00 PM

## 2014-03-22 LAB — HSV(HERPES SIMPLEX VRS) I + II AB-IGG
HSV 1 Glycoprotein G Ab, IgG: 8.82 IV — ABNORMAL HIGH
HSV 2 Glycoprotein G Ab, IgG: 0.36 IV

## 2014-03-22 LAB — RPR

## 2014-03-23 ENCOUNTER — Telehealth: Payer: Self-pay | Admitting: Radiology

## 2014-03-23 DIAGNOSIS — R87610 Atypical squamous cells of undetermined significance on cytologic smear of cervix (ASC-US): Secondary | ICD-10-CM

## 2014-03-23 LAB — PAP IG, CT-NG, RFX HPV ASCU
Chlamydia Probe Amp: NEGATIVE
GC Probe Amp: NEGATIVE

## 2014-03-23 NOTE — Telephone Encounter (Signed)
Pt is calling about labs but they haven't been reviewed yet.

## 2014-03-24 LAB — HUMAN PAPILLOMAVIRUS, HIGH RISK: HPV DNA High Risk: DETECTED — AB

## 2014-03-28 ENCOUNTER — Ambulatory Visit (INDEPENDENT_AMBULATORY_CARE_PROVIDER_SITE_OTHER): Payer: Medicare Other | Admitting: Neurology

## 2014-03-28 ENCOUNTER — Encounter: Payer: Self-pay | Admitting: Neurology

## 2014-03-28 VITALS — BP 149/90 | HR 54 | Wt 168.8 lb

## 2014-03-28 DIAGNOSIS — I2510 Atherosclerotic heart disease of native coronary artery without angina pectoris: Secondary | ICD-10-CM

## 2014-03-28 DIAGNOSIS — G4710 Hypersomnia, unspecified: Secondary | ICD-10-CM

## 2014-03-28 DIAGNOSIS — G4719 Other hypersomnia: Secondary | ICD-10-CM

## 2014-03-28 NOTE — Progress Notes (Signed)
[facility] [address] [place]. [place] [id number] [phone]       OFFICE FOLLOW UP VISIT NOTE  Tina. Laural BenesKathi G [name] Date of Birth:  March 02, 1956 Medical Record Number:  [id number]   Referring MD:  Elvina SidleKurt Lauenstein  Reason for Referral:   headaches HPI: [name] is [age] year Caucasian lady who has a long-standing history of migraine headaches which were previously quite infrequent but who the last 3-4 months or so she noted increasing frequency and severity of headaches. She describes the headache as bitemporal pressure-like moderate to severe 8/10 in severity. Headache is a complaint by mild nausea but no vomiting. She does have light sensitivity and feels better if she sleeps. She takes Ultram which seems to help but going to sleep as necessary. The past she is to get relief with Tylenol No. 3 but she developed allergy to codeine with itching and had stopped it. Headache frequency is every second or third day. She does remember being on Topamax in the past for migraine prophylaxis but stopped 10 years ago. She denies any visual symptoms with the headache, accompanying focal neurological symptoms. She is unable to address this retrieval for headache. 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 She was seen by me at that time she also had a TIA in June 2012 with an MRI showing no acute findings. She was homeless and had no finances and hence she did not keep subsequent followup in our office. She in fact [place] [facility] [place] to stay with family. She wasn't Medicaid for a while and recently got Medicaid approval and has moved back to [place]. She was seen on 11/16/13 in urgent care by Dr. Maye Hidesurt Lowenstein who ordered some lab work which showed is significantly limited TSH and lipids. The patient had been off her thyroid medication for more than a month at that time. She has subsequently been started on thyroxine but has not  noticed significant improvement. She in fact was seen by a neurologist in [place] for tremor which seems to have resolved. She is on Lipitor 40 mg but states she was switched from Crestor which worked quite well for her but Medicaid would not cover crestor. Now that she has Medicare   she wants to go back on Crestor but wants to wait for this until she sees cardiologist Dr. Delton SeeNelson next week. She states she takes her medication regularly now and her blood pressure is well controlled though it is slightly limited in the office today and 142/97. She has not had any screening carotid Dopplers done for several years. She denies any recurrent TIA or stroke symptoms for the last 3 years since her last visit. She denies any scalp tenderness, jaw claudication, myalgias or loss of vision or blurred vision with her headaches. Update 03/28/2014 : She returns for f/u after initial consult on 01/03/14. She states she's noticed improvement in her headaches which now occur only to 3 times per week and only in the mornings and respond well to determine all. She is tolerating Topamax 50 mg twice daily without any side effects and feels overall better. She however is complaining of excessive daytime sleepiness and she in fact his care to drive as she fell asleep once. She does have a history of sleep apnea and was prescribed a CPAP but she has not been taking it for a year. She states she has a mask which fits but does not  have a CPAP machine. She wants referral to a sleep physician. She was diagnosed with atrial flutter transiently and switched from Plavix to Xarelto. She was admitted for elective cardioversion but spontaneously converted to sinus rhythm. She had MRI scan the brain done on 01/20/2014 which I have personally reviewed shows remote age right middle cerebral artery infarct with encephalomalacia and gliosis. There were moderate changes of chronic microvascular ischemia which appeared more advanced compared with previous  MRI scan dated 03/07/2011. Carotid ultrasound dated 01/14/14 personally reviewed shows no significant except anastomosis and passed Doppler studies show mildly abnormal velocities in the right anterior cerebral artery and left middle cerebral artery of unclear significance. ROS:   14 system review of systems is positive for headache, f confusion, decreased concentration, depression, , daytime sleepiness, frequent waking, sleep apnea, and all other systems negative PMH:  Past Medical History  Diagnosis Date  . CAD (coronary artery disease)     a.  cath 09/2010: LAD stent patent, S-Int/dCFX ok, S-PDA ok, L-LAD atretic;  b. Lexiscan Myoview (02/2014):  no ischemia, EF 55%   . Hypertension   . Hx of CABG   . Hx of transesophageal echocardiography (TEE) for monitoring 11/2010    TEE 11/2010: EF 60-65%, BAE, trivial atrial septal shunt;  right heart cath in 10/2010 with elevated R and L heart pressures and diuretic started  . HLD (hyperlipidemia)   . Hypothyroidism   . Sleep apnea   . COPD (chronic obstructive pulmonary disease)   . Pulmonary nodules     repeat CT due in 11/2011  . Acute right MCA stroke 11/07/10  . 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 Eczema   . Depression   . Atrial fibrillation     a. s/p TEE-DCCV 02/2104; b. Xarelto started  . Cardiomyopathy with EF 40% at TEE 02/17/14 (likely tachycardia mediated - Myoview 02/19/14 neg for ischemia with normal EF) 02/18/2014    Social History:  History   Social History  . Marital Status: Divorced    Spouse Name: N/A    Number of Children: 2  . Years of Education: N/A   Occupational History  . DISABLE     Social History Main Topics  . Smoking status: Current Every Day Smoker -- 0.25 packs/day for 30 years    Types: Cigarettes    Last Attempt to Quit: 02/14/2014  . Smokeless tobacco: Never Used  . Alcohol Use: No  . Drug Use: No  . Sexual Activity: Not on file   Other Topics Concern  . Not on file   Social History  Narrative   Divorced   After stroke was living with sister and mother after sister asked her to leave.   Difficulties over control.    Then moved back in for about 6 weeks.   . tranportaion difficult son  helping   Ex-smoker   Was living at friends  Sister and her had argument and she was told to leave .       She is now living in a homeless shelter for the last 3 weeks [facility]    son had help with transportation and medication       Work status regular  before got sick on short-term disability now lost t insurance after the stroke and couldn't work was  denied ssi    2 times.  She is not eligible for Medicaid because she doesn't have dependent children.  College graduate ;psychology [employer] graduated May 2011   Has children    Medications:   Current Outpatient Prescriptions on File Prior to Visit  Medication Sig Dispense Refill  . albuterol (PROVENTIL HFA;VENTOLIN HFA) 108 (90 BASE) MCG/ACT inhaler Inhale 2 puffs into the lungs every 6 (six) hours as needed for wheezing or shortness of breath.  1 Inhaler  2  . amiodarone (PACERONE) 400 MG tablet Take 1 tablet (400 mg total) by mouth 2 (two) times daily.  30 tablet  0  . ARIPiprazole (ABILIFY) 10 MG tablet Take 10 mg by mouth daily.      [name] atorvastatin (LIPITOR) 80 MG tablet Take 0.5 tablets (40 mg total) by mouth daily.  30 tablet  11  . diltiazem (CARDIZEM CD) 120 MG 24 hr capsule Take 1 capsule (120 mg total) by mouth daily.  90 capsule  4  . furosemide (LASIX) 40 MG tablet Take 0.5 tablets (20 mg total) by mouth daily.  60 tablet  5  . lamoTRIgine (LAMICTAL) 100 MG tablet Take 1 tablet (100 mg total) by mouth daily.  30 tablet  11  . levothyroxine (SYNTHROID, LEVOTHROID) 125 MCG tablet Take 1 tablet (125 mcg total) by mouth daily.  30 tablet  11  . potassium chloride SA (K-DUR,KLOR-CON) 20 MEQ tablet Take 1 tablet (20 mEq total) by mouth 2 (two) times daily.  60 tablet  5  . rivaroxaban (XARELTO) 20 MG TABS tablet Take  1 tablet (20 mg total) by mouth daily with supper.  30 tablet  10  . topiramate (TOPAMAX) 50 MG tablet Take 50 mg by mouth 2 (two) times daily.      . traMADol (ULTRAM) 50 MG tablet Take 2 tablets (100 mg total) by mouth every 6 (six) hours as needed for moderate pain.  30 tablet  1   No current facility-administered medications on file prior to visit.    Allergies:   Allergies  Allergen Reactions  . Amoxicillin Hives  . Avelox [Moxifloxacin Hcl In Nacl] Other (See Comments)    Mental breakdown.  [name] Hydrocodone Itching  . Penicillins Hives  . Sulfa Antibiotics Other (See Comments)    unknown    Physical Exam General: well developed, well nourished, seated, in no evident distress Head: head normocephalic and atraumatic. Orohparynx benign Neck: supple with no carotid or supraclavicular bruits Cardiovascular: regular rate and rhythm, no murmurs Musculoskeletal: no deformity Skin:  no rash/petichiae Vascular:  Normal pulses all extremities Filed Vitals:   03/28/14 1050  BP: 149/90  Pulse: 54    Neurologic Exam Mental Status: Awake and fully alert. Oriented to place and time. Recent and remote memory intact. Recall 3/3. Animal naming 9. Attention span, concentration and fund of knowledge appropriate. Mood and affect appropriate.  Cranial Nerves: Fundoscopic exam not done  . Pupils equal, briskly reactive to light. Extraocular movements full without nystagmus. Visual fields full to confrontation. Hearing intact. Facial sensation intact. Mild left lower facial asymmetry.Tongue, palate moves normally and symmetrically.  Motor: Normal bulk and tone. Normal strength in all tested extremity muscles. Diminished fine finger movements on the left. Orbits right over left upper extremity. Minimum weakness of the left grip. Sensory.: intact to tough and pinprick and vibratory.  Coordination: Rapid alternating movements normal in all extremities. Finger-to-nose and heel-to-shin performed  accurately bilaterally. Gait and Station: Arises from chair without difficulty. Stance is normal. Gait demonstrates normal stride length and balance . Able to heel, toe and tandem walk without difficulty.  Reflexes: 1+  and symmetric. Toes downgoing.       ASSESSMENT: 83 year lady with long-standing history of migraine headaches with now mixed tension and migraine headaches which have increased in frequency and severity over the last 3-4 months. Remote history of right MCA infarct in February 2012 and TIA in June 2012 with mild residual left sided weakness. Multiple risk factors of hypertension, hyperlipidemia, coronary artery disease and   sleep apnoea. New complaint of excessive daytime sleepiness likely related to untreated sleep apnea    PLAN: I had a long discussion with the patient regarding her headaches which seem to improve. I recommend increase Topamax to 50 mg at am and 100 mg at night. Continue tramadol as needed. Referral to Dr. Frances Furbish for treatment for sleep apnea and excessive daytime sleepiness. Continue Xarelto for stroke prevention and   maintain strict control of hypertension with blood pressure goal below 130/90, diabetes with hemoglobin A1c goal below 6.5% and lipids with LDL cholesterol goal below 100 mg/dL. Followup in the future with me in  6 months  Note: This document was prepared with digital dictation and possible smart phrase technology. Any transcriptional errors that result from this process are unintentional.

## 2014-03-28 NOTE — Patient Instructions (Addendum)
I had a long discussion with the patient regarding her headaches which seem to improve. I recommend increase Topamax to 50 mg at am and 100 mg at night. Continue tramadol as needed. Referral to Dr. Frances Furbish for treatment for sleep apnea and excessive daytime sleepiness. Continue Xarelto for stroke prevention and   maintain strict control of hypertension with blood pressure goal below 130/90, diabetes with hemoglobin A1c goal below 6.5% and lipids with LDL cholesterol goal below 100 mg/dL. Followup in the future with me in  6 months

## 2014-03-28 NOTE — Telephone Encounter (Signed)
LM to call me back . I am calling in regards to Rest of Pap test came back and had ASCUS with + HPV, thought she only had ASCUS , since + HPV will refer to ob/gyn for ASCUS with + HPV. Had already previously spoken to her about rest of labs.

## 2014-03-30 DIAGNOSIS — R87610 Atypical squamous cells of undetermined significance on cytologic smear of cervix (ASC-US): Secondary | ICD-10-CM | POA: Diagnosis not present

## 2014-03-31 ENCOUNTER — Telehealth: Payer: Self-pay | Admitting: Neurology

## 2014-03-31 DIAGNOSIS — G4733 Obstructive sleep apnea (adult) (pediatric): Secondary | ICD-10-CM

## 2014-03-31 DIAGNOSIS — I639 Cerebral infarction, unspecified: Secondary | ICD-10-CM

## 2014-03-31 NOTE — Telephone Encounter (Signed)
..   Dr. Delia Heady is referring [name], 58 y.o. female, for an attended sleep study.  Wt: 168 lbs Ht: 63 in BMI: 29.59  Diagnoses: Obstructive Sleep Apnea Stroke Headaches Excessive Daytime Sleepiness CAD Hypertension COPD Hyperlipidemia Atrial Fibrillation Cardiomyopathy  Medication List: Current Outpatient Prescriptions  Medication Sig Dispense Refill  . albuterol (PROVENTIL HFA;VENTOLIN HFA) 108 (90 BASE) MCG/ACT inhaler Inhale 2 puffs into the lungs every 6 (six) hours as needed for wheezing or shortness of breath.  1 Inhaler  2  . amiodarone (PACERONE) 400 MG tablet Take 1 tablet (400 mg total) by mouth 2 (two) times daily.  30 tablet  0  . ARIPiprazole (ABILIFY) 10 MG tablet Take 10 mg by mouth daily.      [name] atorvastatin (LIPITOR) 80 MG tablet Take 0.5 tablets (40 mg total) by mouth daily.  30 tablet  11  . diltiazem (CARDIZEM CD) 120 MG 24 hr capsule Take 1 capsule (120 mg total) by mouth daily.  90 capsule  4  . furosemide (LASIX) 40 MG tablet Take 0.5 tablets (20 mg total) by mouth daily.  60 tablet  5  . lamoTRIgine (LAMICTAL) 100 MG tablet Take 1 tablet (100 mg total) by mouth daily.  30 tablet  11  . levothyroxine (SYNTHROID, LEVOTHROID) 125 MCG tablet Take 1 tablet (125 mcg total) by mouth daily.  30 tablet  11  . potassium chloride SA (K-DUR,KLOR-CON) 20 MEQ tablet Take 1 tablet (20 mEq total) by mouth 2 (two) times daily.  60 tablet  5  . rivaroxaban (XARELTO) 20 MG TABS tablet Take 1 tablet (20 mg total) by mouth daily with supper.  30 tablet  10  . topiramate (TOPAMAX) 50 MG tablet Take 50 mg by mouth 2 (two) times daily.      . traMADol (ULTRAM) 50 MG tablet Take 2 tablets (100 mg total) by mouth every 6 (six) hours as needed for moderate pain.  30 tablet  1   No current facility-administered medications for this visit.    This patient presents to Dr. Delia Heady in follow up for migraine/headache.  She has a hx of right MCA infarct.  Pt was dx'd with  obstructive sleep apnea several years ago.  No longer has CPAP equipment.  She is complaining of excessive daytime sleepiness and has reportedly fallen asleep behind the wheel.  She awakens frequently throughout the night and has non-restorative sleep.  Pt has extensive medical hx which includes COPD and afib.  Please review for an attended sleep study.  Insurance:  Medicare - prior approval is not required

## 2014-04-13 NOTE — Telephone Encounter (Signed)
Noted  

## 2014-04-19 ENCOUNTER — Other Ambulatory Visit: Payer: Self-pay | Admitting: Neurology

## 2014-04-20 ENCOUNTER — Ambulatory Visit: Payer: Medicare Other | Admitting: Neurology

## 2014-04-20 NOTE — Telephone Encounter (Signed)
Rx has been faxed.

## 2014-04-20 NOTE — Telephone Encounter (Signed)
Dr Sethi is out of the office.  Forwarding request to [facility] for approval.   

## 2014-04-21 ENCOUNTER — Ambulatory Visit (INDEPENDENT_AMBULATORY_CARE_PROVIDER_SITE_OTHER): Payer: Medicare Other | Admitting: Neurology

## 2014-04-21 DIAGNOSIS — I639 Cerebral infarction, unspecified: Secondary | ICD-10-CM

## 2014-04-21 DIAGNOSIS — G4710 Hypersomnia, unspecified: Secondary | ICD-10-CM | POA: Diagnosis not present

## 2014-04-21 DIAGNOSIS — G4733 Obstructive sleep apnea (adult) (pediatric): Secondary | ICD-10-CM

## 2014-04-29 ENCOUNTER — Telehealth: Payer: Self-pay | Admitting: Cardiology

## 2014-04-29 NOTE — Telephone Encounter (Signed)
New problem ° ° ° °Pt having SOB.. °

## 2014-04-29 NOTE — Telephone Encounter (Signed)
Left message for pt of orders from Dr Delton See and her recommendation to be seen in the ED. Per her chart pt is already taking potassium Chl 20 MEQ BID.  Requested she call back and let us know if is doesn't go to the ED so that we can see about getting her scheduled on Wednesday (Dr Delton See is Reader and does not have an appt schedule)

## 2014-04-29 NOTE — Telephone Encounter (Signed)
Spoke with pt above s/s of CHF - she reports abdominal swelling, bilateral ankle edema "I have cankles", increased SOB and feeling weak and dizzy.  She reports an increase in weight of about 1 lb per day.  Attempted to have her go to the ER for evaluation but she states she can not do that today.  She is taking Furosemide 40 mg 1/2 tablet daily.  Last K+ was 3.4 and renal function has been elevated in the past.  She is requesting to wait to see Dr Delton See next week in the office.  Advised her schedule is full however we may be able to add her on to her schedule on Tuesday at 12N.  Left message for Gilda Crease - Dr Lindaann Slough scheduler to see if pt can be put in that spot.  I again strongly encouraged pt to go to ED for eval and if she doesn't go today and s/s continue to worsen she should report this weekend.  She states understanding.  She will await c/b about appt.

## 2014-04-29 NOTE — Telephone Encounter (Signed)
Please advise her to take 40 mg of Lasix and prescribe her 10 mEq of KCl daily. I strongly encourage her to go to the ER. If she is not able to go, I can see her on Wednesday. KN

## 2014-05-02 NOTE — Telephone Encounter (Signed)
Following up on her call from Fri 8/21.  She states she is feeling much better.  States she got 6 lbs of fluid off the first 24hrs. Ankles are not swollen now and no SOB. States she has an appointment to see Dr. Delton See tomorrow 8/25.

## 2014-05-03 ENCOUNTER — Ambulatory Visit: Payer: Medicare Other | Admitting: Cardiology

## 2014-05-09 ENCOUNTER — Telehealth: Payer: Self-pay | Admitting: Neurology

## 2014-05-09 ENCOUNTER — Other Ambulatory Visit: Payer: Self-pay | Admitting: Neurology

## 2014-05-09 ENCOUNTER — Encounter: Payer: Self-pay | Admitting: Neurology

## 2014-05-09 NOTE — Telephone Encounter (Signed)
I called the patient and reviewed the results of her sleep study.  She is aware that no significant obstructive sleep apnea was found.  She was advised of Dr. Oliva Bustard recommendation to follow up with her cardiologist as her EKG did reveal atrial fibrillation.  Also with her pcp for a referral for a pulmonary work up.  A copy of the sleep study report was sent to the pt's cardiologist and her primary care physician.  The patient will receive her sleep study report via mail.

## 2014-05-11 ENCOUNTER — Ambulatory Visit (INDEPENDENT_AMBULATORY_CARE_PROVIDER_SITE_OTHER): Payer: Medicare Other | Admitting: Family Medicine

## 2014-05-11 VITALS — BP 142/80 | HR 61 | Temp 97.6°F | Resp 15 | Ht 63.0 in | Wt 168.6 lb

## 2014-05-11 DIAGNOSIS — I6990 Unspecified sequelae of unspecified cerebrovascular disease: Secondary | ICD-10-CM | POA: Diagnosis not present

## 2014-05-11 DIAGNOSIS — N390 Urinary tract infection, site not specified: Secondary | ICD-10-CM | POA: Diagnosis not present

## 2014-05-11 DIAGNOSIS — Z9181 History of falling: Secondary | ICD-10-CM | POA: Diagnosis not present

## 2014-05-11 DIAGNOSIS — S50312A Abrasion of left elbow, initial encounter: Secondary | ICD-10-CM

## 2014-05-11 DIAGNOSIS — IMO0002 Reserved for concepts with insufficient information to code with codable children: Secondary | ICD-10-CM

## 2014-05-11 DIAGNOSIS — I2510 Atherosclerotic heart disease of native coronary artery without angina pectoris: Secondary | ICD-10-CM

## 2014-05-11 DIAGNOSIS — I6930 Unspecified sequelae of cerebral infarction: Secondary | ICD-10-CM

## 2014-05-11 DIAGNOSIS — R296 Repeated falls: Secondary | ICD-10-CM

## 2014-05-11 LAB — POCT CBC
Granulocyte percent: 64 %G (ref 37–80)
HCT, POC: 43.2 % (ref 37.7–47.9)
Hemoglobin: 14.1 g/dL (ref 12.2–16.2)
Lymph, poc: 2.6 (ref 0.6–3.4)
MCH, POC: 31.3 pg — AB (ref 27–31.2)
MCHC: 32.7 g/dL (ref 31.8–35.4)
MCV: 95.7 fL (ref 80–97)
MID (cbc): 0.3 (ref 0–0.9)
MPV: 8.6 fL (ref 0–99.8)
POC Granulocyte: 5.2 (ref 2–6.9)
POC LYMPH PERCENT: 32 %L (ref 10–50)
POC MID %: 4 % (ref 0–12)
Platelet Count, POC: 283 10*3/uL (ref 142–424)
RBC: 4.51 M/uL (ref 4.04–5.48)
RDW, POC: 14.9 %
WBC: 8.2 10*3/uL (ref 4.6–10.2)

## 2014-05-11 LAB — POCT UA - MICROSCOPIC ONLY
Casts, Ur, LPF, POC: NEGATIVE
Crystals, Ur, HPF, POC: NEGATIVE
Mucus, UA: NEGATIVE
Yeast, UA: NEGATIVE

## 2014-05-11 LAB — POCT URINALYSIS DIPSTICK
Bilirubin, UA: NEGATIVE
Blood, UA: NEGATIVE
Glucose, UA: NEGATIVE
Ketones, UA: NEGATIVE
Nitrite, UA: NEGATIVE
Protein, UA: NEGATIVE
Spec Grav, UA: 1.01
Urobilinogen, UA: 0.2
pH, UA: 5.5

## 2014-05-11 MED ORDER — NITROFURANTOIN MONOHYD MACRO 100 MG PO CAPS: 100.0000 mg | ORAL_CAPSULE | Freq: Two times a day (BID) | ORAL | Status: DC

## 2014-05-11 MED ORDER — FLUCONAZOLE 150 MG PO TABS: 150.0000 mg | ORAL_TABLET | Freq: Once | ORAL | Status: DC

## 2014-05-11 NOTE — Progress Notes (Signed)
Chief Complaint:  Chief Complaint  Patient presents with  . Arm Pain    patient fell scraped left arm    HPI: [name] is a 58 y.o. female who is here for  Left elbow abrasion, fell, she was standing and the fell and scraped her hand, she states she did not hit her head or have LOC, she feels weak inher legs , she just topples over, she has no warning signs. She states she has just fallen 2-3 times in the last 2 weeks. She feels her legs are weak. She ahs not been exercising much since she was told she had Afib/futter and was put on Xarelto after cardioversion, she has been walking around in her house. She has had no LOC, did not hit head. She is on Xarelto. She ahs some bleeding, she has  Full fxn of her elbow, she is not in pain. She denies CP, SOB, palpitations. She has no urianry sxs. Denies any irregular heart beat.  She has redsidula left sided weakness from a prior stroke. She know she is not well conditoned.  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 CAD (coronary artery disease)     a.  cath 09/2010: LAD stent patent, S-Int/dCFX ok, S-PDA ok, L-LAD atretic;  b. Lexiscan Myoview (02/2014):  no ischemia, EF 55%   . Hypertension   . Hx of CABG   . Hx of transesophageal echocardiography (TEE) for monitoring 11/2010    TEE 11/2010: EF 60-65%, BAE, trivial atrial septal shunt;  right heart cath in 10/2010 with elevated R and L heart pressures and diuretic started  . HLD (hyperlipidemia)   . Hypothyroidism   . COPD (chronic obstructive pulmonary disease)   . Pulmonary nodules     repeat CT due in 11/2011  . Acute right MCA stroke 11/07/10  . Hx MRSA infection     Chest wall syndrome post CABG   . Eczema   . Depression   . Atrial fibrillation     a. s/p TEE-DCCV 02/2104; b. Xarelto started  . Cardiomyopathy with EF 40% at TEE 02/17/14 (likely tachycardia mediated - Myoview 02/19/14 neg for ischemia with normal EF) 02/18/2014  . 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 Sleep apnea     recent sleep study  in 04/2014 per chart review  shows no significant OSA   Past Surgical History  Procedure Laterality Date  . Coronary artery bypass graft    . Debriment for infection in chest    . Chest wall reconstruction    . Bilateral knee surgery    . Bladder surgery    . Left mastoidectomy    . Hernia repair    . Cath 2012    . Tee without cardioversion N/A 02/17/2014    Procedure: TRANSESOPHAGEAL ECHOCARDIOGRAM (TEE);  Surgeon: Pricilla Riffle, MD;  Location: [facility] ENDOSCOPY;  Service: Cardiovascular;  Laterality: N/A;  . Cardioversion N/A 02/17/2014    Procedure: CARDIOVERSION;  Surgeon: Pricilla Riffle, MD;  Location: [facility] ENDOSCOPY;  Service: Cardiovascular;  Laterality: N/A;   History  Social History  . Marital Status: Divorced    Spouse Name: N/A    Number of Children: 2  . Years of Education: N/A   Occupational History  . DISABLE     Social History Main Topics  . Smoking status: Former Smoker -- 0.25 packs/day for 30 years    Types: Cigarettes    Quit date: 05/09/2014  . Smokeless tobacco: Never Used  . Alcohol Use: No  . Drug Use: No  . Sexual Activity: None   Other Topics Concern  . None   Social History Narrative   Divorced   After stroke was living with sister and mother after sister asked her to leave.   Difficulties over control.    Then moved back in for about 6 weeks.   . tranportaion difficult son  helping   Ex-smoker   Was living at friends  Sister and her had argument and she was told to leave .       She is now living in a homeless shelter for the last 3 weeks [facility]    son had help with transportation and medication        Work status regular  before got sick on short-term disability now lost t insurance after the stroke and couldn't work was  denied ssi    2 times.  She is not eligible for Medicaid because she doesn't have dependent children.         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 COPD Mother   . Heart disease Mother   . Arthritis Mother     Rheumatoid and PMR  . Heart attack Father   . Depression Father   . Hypertension Father   . Osteoporosis Mother     Mom fractured hip  . Diabetes type II Mother    Allergies  Allergen Reactions  . Amoxicillin Hives  . Avelox [Moxifloxacin Hcl In Nacl] Other (See Comments)    Mental breakdown.  [name] Hydrocodone Itching  . Penicillins Hives  . Sulfa Antibiotics Other (See Comments)    unknown   Prior to Admission medications   Medication Sig Start Date End Date Taking? Authorizing Provider  albuterol (PROVENTIL HFA;VENTOLIN HFA) 108 (90 BASE) MCG/ACT inhaler Inhale 2 puffs into the lungs every 6 (six) hours as needed for wheezing or shortness of breath. 03/06/14  Yes Esperanza Sheets, MD  amiodarone (PACERONE) 400 MG tablet Take 1 tablet (400 mg total) by mouth 2 (two) times daily. 03/06/14  Yes Esperanza Sheets, MD  ARIPiprazole (ABILIFY) 10 MG tablet Take 10 mg by mouth daily.   Yes Historical Provider, MD  atorvastatin (LIPITOR) 80 MG tablet Take 0.5 tablets (40 mg total) by mouth daily. 02/20/14  Yes Brittainy Sharol Harness, PA-C  diltiazem (CARDIZEM CD) 120 MG 24 hr capsule Take 1 capsule (120 mg total) by mouth daily. 03/10/14  Yes Lars Masson, MD  furosemide (LASIX) 40 MG tablet Take 0.5 tablets (20 mg total) by mouth daily. 03/06/14  Yes Esperanza Sheets, MD  lamoTRIgine (LAMICTAL) 100 MG tablet Take 1 tablet (100 mg total) by mouth daily. 11/16/13  Yes Elvina Sidle, MD  levothyroxine (SYNTHROID, LEVOTHROID) 125 MCG tablet Take 1 tablet (125 mcg total) by mouth daily.  11/16/13  Yes Elvina Sidle, MD  potassium chloride SA (K-DUR,KLOR-CON) 20 MEQ tablet Take 1 tablet (20 mEq total) by mouth 2 (two) times daily. 02/20/14  Yes Brittainy Simmons, PA-C  rivaroxaban (XARELTO) 20 MG TABS tablet Take 1 tablet (20 mg total) by mouth daily with supper. 02/20/14  Yes Brittainy Sharol Harness, PA-C  topiramate (TOPAMAX) 50 MG tablet  in am and  at night 05/09/14  Yes Delia Heady, MD  traMADol (ULTRAM) 50 MG tablet TAKE 2 TABLETS BY MOUTH EVERY 6 HOURS AS NEEDED FOR MODERATE PAIN 04/20/14  Yes Omelia Blackwater, DO     ROS: The patient denies fevers, chills, night sweats, unintentional weight loss, chest pain, palpitations, wheezing, dyspnea on exertion, nausea, vomiting, abdominal pain, dysuria, hematuria, melena, numbness,  or tingling.   All other systems have been reviewed and were otherwise negative with the exception of those mentioned in the HPI and as above.    PHYSICAL EXAM: Filed Vitals:   05/11/14 1941  BP: 142/80  Pulse: 61  Temp: 97.6 F (36.4 C)  Resp: 15   Filed Vitals:   05/11/14 1941  Height:  (1.6 m)  Weight: 168 lb 9.6 oz (76.476 kg)   Body mass index is 29.87 kg/(m^2).  General: Alert, no acute distress HEENT:  Normocephalic, atraumatic, oropharynx patent. EOMI, PERRLA, tm . fundo both bl Cardiovascular:  Regular rate and rhythm, no rubs murmurs or gallops.  No radial pulse intact. No pedal edema.  Respiratory: Clear to auscultation bilaterally.  No wheezes, rales, or rhonchi.  No cyanosis, no use of accessory musculature GI: No organomegaly, abdomen is soft and non-tender, positive bowel sounds.  No masses. Skin: + left superficial elbow abrasion, she has full ROM of her elbow, it is nontender, there is no excessive bleeding.  Neurologic: Facial musculature symmetric. Psychiatric: Patient is appropriate throughout our interaction. Lymphatic: No cervical lymphadenopathy Musculoskeletal: Gait intact. She has 4/5 strength on left  vs right   LABS: Results for orders placed in visit on 05/11/14  COMPLETE METABOLIC PANEL WITH GFR      Result Value Ref Range   Sodium 141  135 - 145 mEq/L   Potassium 3.1 (*) 3.5 - 5.3 mEq/L   Chloride 99  96 - 112 mEq/L   CO2 31  19 - 32 mEq/L   Glucose, Bld 107 (*) 70 - 99 mg/dL   BUN 17  6 - 23 mg/dL   Creat 1.61 (*) 0.96 - 1.10 mg/dL   Total Bilirubin 0.7  0.2 - 1.2 mg/dL   Alkaline Phosphatase 84  39 - 117 U/L   AST 13  0 - 37 U/L   ALT <8  0 - 35 U/L   Total Protein 7.7  6.0 - 8.3 g/dL   Albumin 4.7  3.5 - 5.2 g/dL   Calcium 9.4  8.4 - 04.5 mg/dL   GFR, Est African American 54 (*)    GFR, Est Non African American 47 (*)   POCT CBC      Result Value Ref Range   WBC 8.2  4.6 - 10.2 K/uL   Lymph, poc 2.6  0.6 - 3.4   POC LYMPH PERCENT 32.0  10 - 50 %L   MID (cbc) 0.3  0 - 0.9   POC MID % 4.0  0 - 12 %M   POC Granulocyte 5.2  2 - 6.9   Granulocyte percent 64.0  37 - 80 %G   RBC 4.51  4.04 - 5.48 M/uL   Hemoglobin 14.1  12.2 - 16.2 g/dL   HCT, POC 40.9  81.1 - 47.9 %   MCV 95.7  80 - 97 fL  MCH, POC 31.3 (*) 27 - 31.2 pg   MCHC 32.7  31.8 - 35.4 g/dL   RDW, POC 57.8     Platelet Count, POC 283.0  142 - 424 K/uL   MPV 8.6  0 - 99.8 fL  POCT UA - MICROSCOPIC ONLY      Result Value Ref Range   WBC, Ur, HPF, POC 3-5     RBC, urine, microscopic 0-2     Bacteria, U Microscopic trace     Mucus, UA neg     Epithelial cells, urine per micros 5-10     Crystals, Ur, HPF, POC neg     Casts, Ur, LPF, POC neg     Yeast, UA neg    POCT URINALYSIS DIPSTICK      Result Value Ref Range   Color, UA yellow     Clarity, UA hazy     Glucose, UA neg     Bilirubin, UA neg     Ketones, UA neg     Spec Grav, UA 1.010     Blood, UA neg     pH, UA 5.5     Protein, UA neg     Urobilinogen, UA 0.2     Nitrite, UA neg     Leukocytes, UA Trace       EKG/XRAY:   Primary read interpreted by Dr. Conley Rolls at [facility].   ASSESSMENT/PLAN: Encounter Diagnoses  Name Primary?  . Abrasion  of elbow, left, initial encounter Yes  . Falls frequently   . History of stroke with residual deficit   . UTI (urinary tract infection), uncomplicated    Ms Ismael is a 58 y.o female with a complicated cardiac history ( CM, CAD s/p CABG, recent cardioversion for afib currently on xarelto, prior right MCA stroke with residula left sided weakness, pulm HTN)  And psych history who is here for a left elbow abrasion which she sustained after abruptly falling and scraping her elbow, she states she ahs fallen 2-3 time like this  In last 2 weeks without any warning, she states she does not have any sxs with this ie palpitations, CP, SOB, URI or UTI sxs.  Orthostatics normal, she has no dizziness with ROM of head Prior h/o E coli sensitive to macrobid, will rx macorbid CMP, Urine culture pedning Wound was cleaned with sopa and water and dry dressing places, no eo infecton advise to put very think layer of vasoline for barrier protection adn dry nonadhesive pads.  F/u prn  She is to see Dr Fabian Sharp and Dr Delton See soon.   Gross sideeffects, risk and benefits, and alternatives of medications d/w patient. Patient is aware that all medications have potential sideeffects and we are unable to predict every sideeffect or drug-drug interaction that may occur.  Carlyle Achenbach PHUONG, DO 05/12/2014 4:56 PM   Called pt and left message about labs. Potassium is low at 3.1. She is on LAsix 20 mg daily. Advise her to take KCL 20 meQ bid x 3 days , if already doing that then need to bump it up to TID, and get Potassium level rechecked either with [place] as a lab only or with Dr Fabian Sharp or Delton See, she ahs an appt soon with them. Will put in futire order for BMP.

## 2014-05-12 ENCOUNTER — Encounter: Payer: Self-pay | Admitting: Family Medicine

## 2014-05-12 ENCOUNTER — Other Ambulatory Visit: Payer: Self-pay | Admitting: Family Medicine

## 2014-05-12 ENCOUNTER — Telehealth: Payer: Self-pay | Admitting: Family Medicine

## 2014-05-12 DIAGNOSIS — E876 Hypokalemia: Secondary | ICD-10-CM

## 2014-05-12 LAB — COMPLETE METABOLIC PANEL WITH GFR
Albumin: 4.7 g/dL (ref 3.5–5.2)
BUN: 17 mg/dL (ref 6–23)
CO2: 31 mEq/L (ref 19–32)
Calcium: 9.4 mg/dL (ref 8.4–10.5)
Chloride: 99 mEq/L (ref 96–112)
GFR, Est African American: 54 mL/min — ABNORMAL LOW
GFR, Est Non African American: 47 mL/min — ABNORMAL LOW
Glucose, Bld: 107 mg/dL — ABNORMAL HIGH (ref 70–99)
Potassium: 3.1 mEq/L — ABNORMAL LOW (ref 3.5–5.3)
Sodium: 141 mEq/L (ref 135–145)
Total Bilirubin: 0.7 mg/dL (ref 0.2–1.2)
Total Protein: 7.7 g/dL (ref 6.0–8.3)

## 2014-05-12 LAB — COMPLETE METABOLIC PANEL WITHOUT GFR
ALT: <8 U/L (ref 0–35)
AST: 13 U/L (ref 0–37)
Alkaline Phosphatase: 84 U/L (ref 39–117)
Creat: 1.28 mg/dL — ABNORMAL HIGH (ref 0.50–1.10)

## 2014-05-12 NOTE — Telephone Encounter (Signed)
Spoke with son, he needs to let mom know about labs and also how to take potassium pills. They will call me abck.

## 2014-05-13 LAB — URINE CULTURE: Colony Count: 50000

## 2014-05-15 ENCOUNTER — Other Ambulatory Visit (INDEPENDENT_AMBULATORY_CARE_PROVIDER_SITE_OTHER): Payer: Medicare Other | Admitting: Radiology

## 2014-05-15 DIAGNOSIS — E876 Hypokalemia: Secondary | ICD-10-CM | POA: Diagnosis not present

## 2014-05-15 LAB — BASIC METABOLIC PANEL WITH GFR
BUN: 16 mg/dL (ref 6–23)
Chloride: 103 meq/L (ref 96–112)
Creat: 1.22 mg/dL — ABNORMAL HIGH (ref 0.50–1.10)
Glucose, Bld: 99 mg/dL (ref 70–99)
Potassium: 3.3 meq/L — ABNORMAL LOW (ref 3.5–5.3)

## 2014-05-15 LAB — BASIC METABOLIC PANEL
CO2: 26 mEq/L (ref 19–32)
Calcium: 9.5 mg/dL (ref 8.4–10.5)
Sodium: 141 mEq/L (ref 135–145)

## 2014-05-15 NOTE — Progress Notes (Signed)
Pt here for labs only. 

## 2014-05-19 ENCOUNTER — Ambulatory Visit (INDEPENDENT_AMBULATORY_CARE_PROVIDER_SITE_OTHER): Payer: Medicare Other | Admitting: Internal Medicine

## 2014-05-19 ENCOUNTER — Encounter: Payer: Self-pay | Admitting: Internal Medicine

## 2014-05-19 VITALS — BP 136/78 | Temp 97.8°F | Ht 63.0 in | Wt 167.0 lb

## 2014-05-19 DIAGNOSIS — Z9181 History of falling: Secondary | ICD-10-CM

## 2014-05-19 DIAGNOSIS — R799 Abnormal finding of blood chemistry, unspecified: Secondary | ICD-10-CM

## 2014-05-19 DIAGNOSIS — R404 Transient alteration of awareness: Secondary | ICD-10-CM

## 2014-05-19 DIAGNOSIS — I429 Cardiomyopathy, unspecified: Secondary | ICD-10-CM

## 2014-05-19 DIAGNOSIS — R7989 Other specified abnormal findings of blood chemistry: Secondary | ICD-10-CM

## 2014-05-19 DIAGNOSIS — I2510 Atherosclerotic heart disease of native coronary artery without angina pectoris: Secondary | ICD-10-CM

## 2014-05-19 DIAGNOSIS — R400 Somnolence: Secondary | ICD-10-CM

## 2014-05-19 DIAGNOSIS — E876 Hypokalemia: Secondary | ICD-10-CM | POA: Diagnosis not present

## 2014-05-19 DIAGNOSIS — L680 Hirsutism: Secondary | ICD-10-CM | POA: Diagnosis not present

## 2014-05-19 DIAGNOSIS — I6930 Unspecified sequelae of cerebral infarction: Secondary | ICD-10-CM

## 2014-05-19 DIAGNOSIS — Z23 Encounter for immunization: Secondary | ICD-10-CM | POA: Diagnosis not present

## 2014-05-19 DIAGNOSIS — I6990 Unspecified sequelae of unspecified cerebrovascular disease: Secondary | ICD-10-CM | POA: Diagnosis not present

## 2014-05-19 DIAGNOSIS — I428 Other cardiomyopathies: Secondary | ICD-10-CM

## 2014-05-19 DIAGNOSIS — I25810 Atherosclerosis of coronary artery bypass graft(s) without angina pectoris: Secondary | ICD-10-CM | POA: Diagnosis not present

## 2014-05-19 DIAGNOSIS — L678 Other hair color and hair shaft abnormalities: Secondary | ICD-10-CM

## 2014-05-19 DIAGNOSIS — F39 Unspecified mood [affective] disorder: Secondary | ICD-10-CM

## 2014-05-19 MED ORDER — EFLORNITHINE HCL 13.9 % EX CREA: TOPICAL_CREAM | CUTANEOUS | Status: DC

## 2014-05-19 NOTE — Patient Instructions (Addendum)
Stay on 3   -20 meq dose of potassium a day and add magnesium oxide  One tab to start  Or slo mag  One   Per day. i will put in order for future labs  After review record about hormone levels and plan .ie testosterone dhea etc.  vaniqua is a topical that can beused daily but unknown cost .  Also there may be medications such as spironolactone that  May help but  Have to be carefully monitored ( but can increase potassium level ) this would be best done by advised by endocrinologist )  Keep appt dr Donell Beers and you cardiologist .  need  Fu potassium level with  Magnesium level next week .  Plan ROV depending on  All of the labs etc or   Will put colon cancer screening in on hold for now as discussed . Will arrange PT referral regarding falls.

## 2014-05-19 NOTE — Progress Notes (Signed)
Pre visit review using our clinic review tool, if applicable. No additional management support is needed unless otherwise documented below in the visit note.  Chief Complaint  Patient presents with  . Follow-up    multiple issues     HPI: [name] is a 58 year old ex smoking lady with history of coronary artery disease hyperlipidemia stroke on anticoagulation for A. fib with a history of depression on medications followed by cardiology who comes in today for followup of multiple issues. Her last visit was to reestablish with PCP.   Has been seen x 2 in [facility] once pr pap  And recetly for abrasion from fall . She states she lost her balance no loss of consciousness was on the stairs   Noted to have low K     Elbow  abrasion doing better. She's been taking 3-20 mEq potassium pills per day for the last 4-5 days. Had a repeat potassium done doesn't know the result yet. Cardiology had increased her Lasix from 20 mg to 40 mg.  Tends to feel weak in the legs but no major changes otherwise  Would like a handicap sticker she hasn't been driving since she fell until she gets better balance.  No sleep apnea.  On recheck.  Dates that she has increased facial hair and asks about going off anticoagulation to have the laser treatment. She tends to have to shave every day there is no change in the rest of her body hair such as her chest or back. Wonders if it could be hormonal.  She's due for colonoscopy but doesn't want to take the risk of going off for several to for that procedure negative family history negative history of polyps per se willing to do stool cards. No active bleeding.  She feels sleepy all the time is on a number of pills for her depression to see Dr. Donell Beers tomorrow which hopefully will help  She's recently been treated for UTI on Macrobid.  ROS: See pertinent positives and negatives per HPI. Has questions about boyfriend coming back from [place]. Hard to swallow large  potassium pilss but does this no v diarrhea  Does get headaches   Past Medical History  Diagnosis Date  . CAD (coronary artery disease)     a.  cath 09/2010: LAD stent patent, S-Int/dCFX ok, S-PDA ok, L-LAD atretic;  b. Lexiscan Myoview (02/2014):  no ischemia, EF 55%   . Hypertension   . Hx of CABG   . Hx of transesophageal echocardiography (TEE) for monitoring 11/2010    TEE 11/2010: EF 60-65%, BAE, trivial atrial septal shunt;  right heart cath in 10/2010 with elevated R and L heart pressures and diuretic started  . HLD (hyperlipidemia)   . Hypothyroidism   . COPD (chronic obstructive pulmonary disease)   . Pulmonary nodules     repeat CT due in 11/2011  . Acute right MCA stroke 11/07/10  . 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 Eczema   . Depression   . Atrial fibrillation     a. s/p TEE-DCCV 02/2104; b. Xarelto started  . Cardiomyopathy with EF 40% at TEE 02/17/14 (likely tachycardia mediated - Myoview 02/19/14 neg for ischemia with normal EF) 02/18/2014  . 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 Sleep apnea     recent sleep study  in 04/2014 per chart review  shows no significant OSA  Family History  Problem Relation Age of Onset  . COPD Mother   . Heart disease Mother   . Arthritis Mother     Rheumatoid and PMR  . Heart attack Father   . Depression Father   . Hypertension Father   . Osteoporosis Mother     Mom fractured hip  . Diabetes type II Mother     History   Social History  . Marital Status: Divorced    Spouse Name: N/A    Number of Children: 2  . Years of Education: N/A   Occupational History  . DISABLE     Social History Main Topics  . Smoking status: Former Smoker -- 0.25 packs/day for 30 years    Types: Cigarettes    Quit date: 05/09/2014  . Smokeless tobacco: Never Used  . Alcohol Use: No  . Drug Use: No  . Sexual Activity: None   Other Topics Concern  . None   Social History Narrative    Divorced   After stroke was living with sister and mother after sister asked her to leave. Mom has since deceased    Difficulties over control.    Then moved back in for about 6 weeks.   . tranportaion difficult son  helping   Ex-smoker   Was living at friends  Sister and her had argument and she was told to leave .    She was living in a homeless shelter for the last 3 weeks [facility]    son had help with transportation and medication       Work status regular  before got sick on short-term disability now lost t insurance after the stroke and couldn't work was  denied ssi    2 times.  She is not eligible for Medicaid because she doesn't have dependent children.         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    Outpatient Encounter Prescriptions as of 05/19/2014  Medication Sig  . albuterol (PROVENTIL HFA;VENTOLIN HFA) 108 (90 BASE) MCG/ACT inhaler Inhale 2 puffs into the lungs every 6 (six) hours as needed for wheezing or shortness of breath.  [name] amiodarone (PACERONE) 400 MG tablet Take 1 tablet (400 mg total) by mouth 2 (two) times daily.  [name] atorvastatin (LIPITOR) 80 MG tablet Take 0.5 tablets (40 mg total) by mouth daily.  [name] diltiazem (CARDIZEM CD) 120 MG 24 hr capsule Take 1 capsule (120 mg total) by mouth daily.  . fluconazole (DIFLUCAN) 150 MG tablet Take 1 tablet (150 mg total) by mouth once. May repeat if symptoms persist  . furosemide (LASIX) 40 MG tablet Take 0.5 tablets (20 mg total) by mouth daily.  [name] levothyroxine (SYNTHROID, LEVOTHROID) 125 MCG tablet Take 1 tablet (125 mcg total) by mouth daily.  . nitrofurantoin, macrocrystal-monohydrate, (MACROBID) 100 MG capsule Take 1 capsule (100 mg total) by mouth 2 (two) times daily.  . potassium chloride SA (K-DUR,KLOR-CON) 20 MEQ tablet Take 1 tablet (20 mEq total) by mouth 2  (two) times daily.  . rivaroxaban (XARELTO) 20 MG TABS tablet Take 1 tablet (20 mg total) by mouth daily with supper.  . topiramate (TOPAMAX) 50 MG tablet  in am and  at night  . traMADol (ULTRAM) 50 MG tablet TAKE 2 TABLETS BY MOUTH EVERY 6 HOURS AS  NEEDED FOR MODERATE PAIN  . [DISCONTINUED] ARIPiprazole (ABILIFY) 10 MG tablet Take 10 mg by mouth daily.  . [DISCONTINUED] lamoTRIgine (LAMICTAL) 100 MG tablet Take 1 tablet (100 mg total) by mouth daily.  . Eflornithine HCl (VANIQA) 13.9 % cream Apply to facial  hair bid    EXAM:  BP 136/78  Temp(Src) 97.8 F (36.6 C) (Oral)  Ht  (1.6 m)  Wt 167 lb (75.751 kg)  BMI 29.59 kg/m2  Body mass index is 29.59 kg/(m^2).  GENERAL: vitals reviewed and listed above, alert, oriented, appears well hydrated and in no acute distress speech is normal except for monitor on she is well-groomed HEENT: atraumatic, conjunctiva  clear, no obvious abnormalities on inspection of external nose and ears  NECK: no obvious masses on inspection palpation  LUNGS: clear to auscultation bilaterally, no wheezes, rales or rhonchi,  CV: HRRR, no clubbing cyanosis or slight peripheral edema nl cap refill  MS: moves all extremities walks with cane tilted legs appear straight walking Skin no acute rashes no midline hair has she appeared like hair over her upper lip and beard area. Healing abrasion left elbow no signs of infection Neuro abnormal gait mild facial assymetry PSYCH: pleasant and cooperative,  anxiety Lab Results  Component Value Date   WBC 8.2 05/11/2014   HGB 14.1 05/11/2014   HCT 43.2 05/11/2014   PLT 239.0 03/18/2014   GLUCOSE 99 05/15/2014   CHOL 237* 11/16/2013   TRIG 85 11/16/2013   HDL 68 11/16/2013   LDLDIRECT 153.0 01/15/2010   LDLCALC 152* 11/16/2013   ALT <8 05/11/2014   AST 13 05/11/2014   NA 141 05/15/2014   K 3.3* 05/15/2014   CL 103 05/15/2014   CREATININE 1.22* 05/15/2014   BUN 16 05/15/2014   CO2 26 05/15/2014   TSH 1.520 03/03/2014   INR 1.20  02/16/2014   HGBA1C  Value: 5.4 (NOTE)                                                                       According to the ADA Clinical Practice Recommendations for 2011, when HbA1c is used as a screening test:   >=6.5%   Diagnostic of Diabetes Mellitus           (if abnormal result  is confirmed)  5.7-6.4%   Increased risk of developing Diabetes Mellitus  References:Diagnosis and Classification of Diabetes Mellitus,Diabetes Care,2011,34(Suppl 1):S62-S69 and Standards of Medical Care in         Diabetes - 2011,Diabetes Care,2011,34  (Suppl 1):S11-S61. 02/11/2011    ASSESSMENT AND PLAN:  Discussed the following assessment and plan:  Hypokalemia - History of same on diuretics fairly significant by memory with need for high-dose replacement. Stay on 60 mEq a day add or 1 dose magnesium and check labs next  - Plan: Basic metabolic panel, Magnesium, Testosterone,Free and Total, DHEA-sulfate, TSH  Need for prophylactic vaccination and inoculation against influenza - Plan: Flu Vaccine QUAD 36+ mos PF IM (Fluarix Quad PF)  Abnormal facial hair - fiarly dramatic no other findings cosmetic trial would not go off xeralto for laser rx  - Plan: Testosterone,Free and Total, DHEA-sulfate, TSH  History of stroke with residual deficit - Plan: Ambulatory referral to Physical Therapy  History of recent fall - PT may be helpful handicatpped sticker filled out - Plan: Ambulatory referral to Physical Therapy  Cardiomyopathy  Somnolence - Possibly from medication or underlying  - Plan: Testosterone,Free and Total, DHEA-sulfate, TSH  Coronary artery disease involving autologous vein coronary bypass graft without angina pectoris - Plan: Testosterone,Free and Total, DHEA-sulfate, TSH  Mood disorder - Plan: Testosterone,Free and Total, DHEA-sulfate, TSH  Creatinine elevation - 1.2 on last check  - Plan: Testosterone,Free and Total, DHEA-sulfate, TSH Pt referral hx of fall handicapped ; Sticker. Form completed and  signed  Reviewed  will put in orders future for our facility  but needs potassium check ed next week  With mag  return or notify health care team  if symptoms worsen ,persist or new concerns arise. Pt declines  And plans to delay colonoscopy et al screening at this time  Neg hx and neg fam hx   Patient Instructions  Stay on 3   -20 meq dose of potassium a day and add magnesium oxide  One tab to start  Or slo mag  One   Per day. i will put in order for future labs  After review record about hormone levels and plan .ie testosterone dhea etc.  vaniqua is a topical that can beused daily but unknown cost .  Also there may be medications such as spironolactone that  May help but  Have to be carefully monitored ( but can increase potassium level ) this would be best done by advised by endocrinologist )  Keep appt dr Donell Beers and you cardiologist .  need  Fu potassium level with  Magnesium level next week .  Plan ROV depending on  All of the labs etc or   Will put colon cancer screening in on hold for now as discussed . Will arrange PT referral regarding falls.    Neta Mends. Mackenzey Crownover M.D. Total visit > 50% spent counseling and coordinating care

## 2014-05-20 ENCOUNTER — Ambulatory Visit (INDEPENDENT_AMBULATORY_CARE_PROVIDER_SITE_OTHER): Payer: Medicare Other | Admitting: Psychiatry

## 2014-05-20 VITALS — BP 136/89 | HR 73 | Ht 63.0 in | Wt 170.4 lb

## 2014-05-20 DIAGNOSIS — F329 Major depressive disorder, single episode, unspecified: Secondary | ICD-10-CM

## 2014-05-20 DIAGNOSIS — F320 Major depressive disorder, single episode, mild: Secondary | ICD-10-CM

## 2014-05-20 DIAGNOSIS — F32A Depression, unspecified: Secondary | ICD-10-CM

## 2014-05-20 MED ORDER — DULOXETINE HCL 60 MG PO CPEP: 60.0000 mg | ORAL_CAPSULE | Freq: Every day | ORAL | Status: DC

## 2014-05-20 MED ORDER — LAMOTRIGINE 100 MG PO TABS: 100.0000 mg | ORAL_TABLET | Freq: Every day | ORAL | Status: DC

## 2014-05-20 MED ORDER — ARIPIPRAZOLE 5 MG PO TABS: ORAL_TABLET | ORAL | Status: DC

## 2014-05-20 NOTE — Progress Notes (Signed)
Psychiatric Assessment Adult  Patient Identification:  [name] Date of Evaluation:  05/20/2014 Chief Complaint: Need a new provider This patient is a 58 year old white divorced mother who is a patient who I met and evaluated a decade ago. She was treated by myself for depression and given Pamelor. The patient since had moved to [place] and been there up until this past December. There she was receiving care from a psychiatrist, Dr. Brigitte Pulse. The patient has been divorced since 1994. She has 2 adult children. One of her children her son lives in [place] and she's planning to move in with him. They're looking for an apartment together. Presently she shares an apartment. The patient is on disability for a right CVA. She got on disability 3 years ago. Her CVAs characterized by problems with balance and some left hand weakness. The patient is a Probation officer. Presently the patient is fairly stable. The patient denies daily depression. She is sleeping chronically poorly. She does describe some sleep problems. She describes being drowsy and sleepy during the day and even falling asleep at times. It is noted that she recently had a sleep study that was negative. She was seen in our system and not diagnosed with narcolepsy. The patient is eating well has good energy. She denies any psychomotor dysfunction. She enjoys writing reading watching TV and looking forward to getting back her dog. The patient is concentrating fairly well. She says is off a little bit since the stroke. She denies a feeling of worthlessness. The patient denies being suicidal now or ever. She's never made a suicide attempt. A close evaluation the patient denies ever having symptoms consistent with major depression. This hard to imagine that I treated her 10 years ago with an antidepressant. As noted is the patient has been on multiple antidepressants with poor success. Presently the patient takes Cymbalta at 120 mg of Abilify 10 mg and Lamictal  100 mg. A close of a lesion the patient denies ever having manic symptoms. The patient denies use of alcohol or drugs. She's never been psychotic. She denies specific anxiety symptoms of a generalized anxiety disorder. She denies panic disorder or OCD. The patient has significant medical problems including her CVA in 2012, coronary artery disease with stent placement and bypass surgery and has residual chest wall pain. The patient also has hypertension. This patient denies ever being in a psychiatric hospital. She's been in therapy most recently with Dr. Antionette Poles. The patient smokes one pack a day of cigarettes. 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 Presently she's involved no new relationships. The patient has no past history of physical or sexual abuse. She's never lost consciousness. She's never had a seizure. The patient denies any shortness of breath. The patient denies any new neurological symptoms. History of Chief Complaint:  No chief complaint on file.   HPI Review of Systems Physical Exam  Depressive Symptoms: hypersomnia,  (Hypo) Manic Symptoms:   Elevated Mood:  No Irritable Mood:  No Grandiosity:  No Distractibility:  No Labiality of Mood:  No Delusions:  No Hallucinations:  No Impulsivity:  No Sexually Inappropriate Behavior:  No Financial Extravagance:  No Flight of Ideas:  No  Anxiety Symptoms: Excessive Worry:  No Panic Symptoms:  No Agoraphobia:  NA Obsessive Compulsive: No  Symptoms: None, Specific Phobias:  No Social Anxiety:  No  Psychotic Symptoms:  Hallucinations: NA None Delusions:  No Paranoia:  No   Ideas of Reference:  No  PTSD Symptoms: Ever had a traumatic exposure:  No Had a traumatic exposure in the last month:  No Re-experiencing: No None Hypervigilance:  No Hyperarousal: No  Avoidance:    Traumatic Brain Injury:    Past Psychiatric History: Diagnosis: Major depression   Hospitalizations: Never   Outpatient Care: Psychotherapy in the  community   Substance Abuse Care:   Self-Mutilation:   Suicidal Attempts:   Violent Behaviors:    Past Medical History:   Past Medical History  Diagnosis Date  . CAD (coronary artery disease)     a.  cath 09/2010: LAD stent patent, S-Int/dCFX ok, S-PDA ok, L-LAD atretic;  b. Lexiscan Myoview (02/2014):  no ischemia, EF 55%   . Hypertension   . Hx of CABG   . Hx of transesophageal echocardiography (TEE) for monitoring 11/2010    TEE 11/2010: EF 60-65%, BAE, trivial atrial septal shunt;  right heart cath in 10/2010 with elevated R and L heart pressures and diuretic started  . HLD (hyperlipidemia)   . Hypothyroidism   . COPD (chronic obstructive pulmonary disease)   . Pulmonary nodules     repeat CT due in 11/2011  . Acute right MCA stroke 11/07/10  . 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 Eczema   . Depression   . Atrial fibrillation     a. s/p TEE-DCCV 02/2104; b. Xarelto started  . Cardiomyopathy with EF 40% at TEE 02/17/14 (likely tachycardia mediated - Myoview 02/19/14 neg for ischemia with normal EF) 02/18/2014  . HPV test positive     with Ascus on pap 2015, followed by Dr Matthew Saras  . Sleep apnea     recent sleep study  in 04/2014 per chart review  shows no significant OSA   History of Loss of Consciousness:   Seizure History:   Cardiac History:  Yes Allergies:   Allergies  Allergen Reactions  . Amoxicillin Hives  . Avelox [Moxifloxacin Hcl In Nacl] Other (See Comments)    Mental breakdown.  [name] Hydrocodone Itching  . Penicillins Hives  . Sulfa Antibiotics Other (See Comments)    unknown   Current Medications:  Current Outpatient Prescriptions  Medication Sig Dispense Refill  . albuterol (PROVENTIL HFA;VENTOLIN HFA) 108 (90 BASE) MCG/ACT inhaler Inhale 2 puffs into the lungs every 6 (six) hours as needed for wheezing or shortness of breath.  1 Inhaler  2  . amiodarone (PACERONE) 400 MG tablet Take 1 tablet (400 mg total) by mouth 2 (two) times daily.  30 tablet   0  . ARIPiprazole (ABILIFY) 5 MG tablet 1 qam  30 tablet  5  . atorvastatin (LIPITOR) 80 MG tablet Take 0.5 tablets (40 mg total) by mouth daily.  30 tablet  11  . diltiazem (CARDIZEM CD) 120 MG 24 hr capsule Take 1 capsule (120 mg total) by mouth daily.  90 capsule  4  . DULoxetine (CYMBALTA) 60 MG capsule Take 1 capsule (60 mg total) by mouth daily.  30 capsule  3  . Eflornithine HCl (VANIQA) 13.9 % cream Apply to facial  hair bid  30 g  2  . fluconazole (DIFLUCAN) 150 MG tablet Take 1 tablet (150 mg total) by mouth once. May repeat if symptoms persist  2 tablet  0  . furosemide (LASIX) 40 MG tablet Take 0.5 tablets (20 mg total) by mouth daily.  60 tablet  5  . lamoTRIgine (LAMICTAL) 100 MG tablet Take 1 tablet (100 mg total) by mouth daily.  30 tablet  11  .  levothyroxine (SYNTHROID, LEVOTHROID) 125 MCG tablet Take 1 tablet (125 mcg total) by mouth daily.  30 tablet  11  . nitrofurantoin, macrocrystal-monohydrate, (MACROBID) 100 MG capsule Take 1 capsule (100 mg total) by mouth 2 (two) times daily.  14 capsule  0  . potassium chloride SA (K-DUR,KLOR-CON) 20 MEQ tablet Take 1 tablet (20 mEq total) by mouth 2 (two) times daily.  60 tablet  5  . rivaroxaban (XARELTO) 20 MG TABS tablet Take 1 tablet (20 mg total) by mouth daily with supper.  30 tablet  10  . topiramate (TOPAMAX) 50 MG tablet 47m in am and 1013mat night  100 tablet  3  . traMADol (ULTRAM) 50 MG tablet TAKE 2 TABLETS BY MOUTH EVERY 6 HOURS AS NEEDED FOR MODERATE PAIN  30 tablet  3   No current facility-administered medications for this visit.    Previous Psychotropic Medications:  Medication Dose   Cymbalta   120 mg   Abilify   10 mg   Lamictal   100 mg                 Medical Consequences of Substance Abuse:   Legal Consequences of Substance Abuse:   Family Consequences of Substance Abuse:   Blackouts:   DT's:   Withdrawal Symptoms:     Social History: [place] Place of [place] Birth:   Family Members:  Marital Status:  Divorced Children: 2  Sons:   Daughters:  Relationships:  Education:  CoDentistroblems/Performance:  Religious Beliefs/Practices:  History of Abuse:  OcShip brokeristory:   Legal History:  Hobbies/Interests:   Family History:   Family History  Problem Relation Age of Onset  . COPD Mother   . Heart disease Mother   . Arthritis Mother     Rheumatoid and PMR  . Heart attack Father   . Depression Father   . Hypertension Father   . Osteoporosis Mother     Mom fractured hip  . Diabetes type II Mother     Mental Status Examination/Evaluation: Objective:  Appearance: Casual  Eye Contact::  Good  Speech:  Clear and Coherent  Volume:  Normal  Mood: Normal   Affect:  Appropriate  Thought Process:  Goal Directed  Orientation:  Full (Time, Place, and Person)  Thought Content:  WDL  Suicidal Thoughts:  No  Homicidal Thoughts:  No  Judgement:  NA  Insight:  Good  Psychomotor Activity:  Normal  Akathisia:  No  Handed:  Right  AIMS (if indicated):    Assets:  Communication Skills    Laboratory/X-Ray Psychological Evaluation(s)        Assessment:  Axis I: Major Depression, single episode  AXIS I Major Depression, single episode  AXIS II Deferred  AXIS III Past Medical History  Diagnosis Date  . CAD (coronary artery disease)     a.  cath 09/2010: LAD stent patent, S-Int/dCFX ok, S-PDA ok, L-LAD atretic;  b. Lexiscan Myoview (02/2014):  no ischemia, EF 55%   . Hypertension   . Hx of CABG   . Hx of transesophageal echocardiography (TEE) for monitoring 11/2010    TEE 11/2010: EF 60-65%, BAE, trivial atrial septal shunt;  right heart cath in 10/2010 with elevated R and L heart pressures and diuretic started  . HLD (hyperlipidemia)   . Hypothyroidism   . COPD (chronic obstructive pulmonary disease)   . Pulmonary nodules     repeat CT due in 11/2011  . Acute right  MCA stroke 11/07/10  . Hx MRSA infection      Chest wall syndrome post CABG  . Eczema   . Depression   . Atrial fibrillation     a. s/p TEE-DCCV 02/2104; b. Xarelto started  . Cardiomyopathy with EF 40% at TEE 02/17/14 (likely tachycardia mediated - Myoview 02/19/14 neg for ischemia with normal EF) 02/18/2014  . HPV test positive     with Ascus on pap 2015, followed by Dr Matthew Saras  . Sleep apnea     recent sleep study  in 04/2014 per chart review  shows no significant OSA     AXIS IV economic problems  AXIS V 61-70 mild symptoms   Treatment Plan/Recommendations:  Plan of Care: At this time we she'll change her Cymbalta from taking 120 mg to take a dose of 60 mg. It should be noted the patient is not taking Cymbalta for pain. It should be also noted that she does take tramadol on a when necessary basis so that since the last Cymbalta the better. I am not composure this patient benefits from antidepressants but this time it seems reasonable to continue it for the time being. The patient will reduce her Abilify from 10 mg to 5 mg. I shared with her the potential albeit small dangers of Abilify especially with long-term use. We will be getting a release of information from Dr. Brigitte Pulse her previous psychiatrist in [place] to see why Abilify was never used. According to the patient she's never been psychotic.is not clear to me that she has a severe depression at all. At this time we'll reduce her Abilify to 5 mg and hopefully over the next few months discontinue it. For the time being she'll take Lamictal 100 mg but even this agent I do not believe is indicated. We'll reevaluate this agent as well. The patient to return to see me in 2 months. She she'll bring her 29 year old son with her at that time. This patient is not suicidal and is functioning fairly well.   Laboratory:    Psychotherapy: None none  Medications: Abilify 10 mg, Cymbalta 120 mg, Lamictal 100 mg   Routine PRN Medications:    Consultations:   Safety Concerns:    Other:       Zanai Mallari, Charlestine Night, MD 9/11/201510:50 AM

## 2014-05-22 ENCOUNTER — Encounter: Payer: Self-pay | Admitting: Internal Medicine

## 2014-05-22 DIAGNOSIS — E876 Hypokalemia: Secondary | ICD-10-CM | POA: Insufficient documentation

## 2014-05-22 DIAGNOSIS — L678 Other hair color and hair shaft abnormalities: Secondary | ICD-10-CM | POA: Insufficient documentation

## 2014-05-22 DIAGNOSIS — R7989 Other specified abnormal findings of blood chemistry: Secondary | ICD-10-CM | POA: Insufficient documentation

## 2014-05-22 DIAGNOSIS — I6930 Unspecified sequelae of cerebral infarction: Secondary | ICD-10-CM | POA: Insufficient documentation

## 2014-05-22 DIAGNOSIS — R400 Somnolence: Secondary | ICD-10-CM | POA: Insufficient documentation

## 2014-05-22 DIAGNOSIS — Z9181 History of falling: Secondary | ICD-10-CM | POA: Insufficient documentation

## 2014-05-23 ENCOUNTER — Encounter: Payer: Self-pay | Admitting: Cardiology

## 2014-05-23 ENCOUNTER — Ambulatory Visit (INDEPENDENT_AMBULATORY_CARE_PROVIDER_SITE_OTHER): Payer: Medicare Other | Admitting: Cardiology

## 2014-05-23 VITALS — BP 114/82 | HR 64 | Ht 63.0 in | Wt 173.6 lb

## 2014-05-23 DIAGNOSIS — I4891 Unspecified atrial fibrillation: Secondary | ICD-10-CM | POA: Diagnosis not present

## 2014-05-23 DIAGNOSIS — I5032 Chronic diastolic (congestive) heart failure: Secondary | ICD-10-CM

## 2014-05-23 DIAGNOSIS — I4892 Unspecified atrial flutter: Secondary | ICD-10-CM

## 2014-05-23 DIAGNOSIS — I2510 Atherosclerotic heart disease of native coronary artery without angina pectoris: Secondary | ICD-10-CM

## 2014-05-23 DIAGNOSIS — I5021 Acute systolic (congestive) heart failure: Secondary | ICD-10-CM | POA: Diagnosis not present

## 2014-05-23 DIAGNOSIS — I429 Cardiomyopathy, unspecified: Secondary | ICD-10-CM

## 2014-05-23 DIAGNOSIS — I428 Other cardiomyopathies: Secondary | ICD-10-CM

## 2014-05-23 LAB — COMPREHENSIVE METABOLIC PANEL
ALT: 11 U/L (ref 0–35)
AST: 14 U/L (ref 0–37)
Albumin: 3.7 g/dL (ref 3.5–5.2)
Alkaline Phosphatase: 75 U/L (ref 39–117)
BUN: 15 mg/dL (ref 6–23)
CO2: 23 mEq/L (ref 19–32)
Calcium: 9 mg/dL (ref 8.4–10.5)
Chloride: 105 mEq/L (ref 96–112)
Creatinine, Ser: 1.1 mg/dL (ref 0.4–1.2)
GFR: 55.06 mL/min — ABNORMAL LOW (ref >60.00)
Glucose, Bld: 82 mg/dL (ref 70–99)
Potassium: 3.8 mEq/L (ref 3.5–5.1)
Sodium: 138 mEq/L (ref 135–145)
Total Bilirubin: 0.4 mg/dL (ref 0.2–1.2)
Total Protein: 7 g/dL (ref 6.0–8.3)

## 2014-05-23 LAB — TSH: TSH: 1.77 u[IU]/mL (ref 0.35–4.50)

## 2014-05-23 MED ORDER — DILTIAZEM HCL ER COATED BEADS 120 MG PO CP24: 60.0000 mg | ORAL_CAPSULE | Freq: Every day | ORAL | Status: DC

## 2014-05-23 NOTE — Progress Notes (Signed)
Patient ID: [name], female   DOB: 1956-08-16, 58 y.o.   MRN: [id number]    Patient Name: [name] Date of Encounter: 05/23/2014  Primary Care Provider:  [name], MD Primary Cardiologist:  Lars Masson  Problem List   Past Medical History  Diagnosis Date  . CAD (coronary artery disease)     a.  cath 09/2010: LAD stent patent, S-Int/dCFX ok, S-PDA ok, L-LAD atretic;  b. Lexiscan Myoview (02/2014):  no ischemia, EF 55%   . Hypertension   . Hx of CABG   . Hx of transesophageal echocardiography (TEE) for monitoring 11/2010    TEE 11/2010: EF 60-65%, BAE, trivial atrial septal shunt;  right heart cath in 10/2010 with elevated R and L heart pressures and diuretic started  . HLD (hyperlipidemia)   . Hypothyroidism   . COPD (chronic obstructive pulmonary disease)   . Pulmonary nodules     repeat CT due in 11/2011  . Acute right MCA stroke 11/07/10  . 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 Eczema   . Depression   . Atrial fibrillation     a. s/p TEE-DCCV 02/2104; b. Xarelto started  . Cardiomyopathy with EF 40% at TEE 02/17/14 (likely tachycardia mediated - Myoview 02/19/14 neg for ischemia with normal EF) 02/18/2014  . 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 Sleep apnea     recent sleep study  in 04/2014 per chart review  shows no significant OSA   Past Surgical History  Procedure Laterality Date  . Coronary artery bypass graft    . Debriment for infection in chest    . Chest wall reconstruction    . Bilateral knee surgery    . Bladder surgery    . Left mastoidectomy    . Hernia repair    . Cath 2012    . Tee without cardioversion N/A 02/17/2014    Procedure: TRANSESOPHAGEAL ECHOCARDIOGRAM (TEE);  Surgeon: Pricilla Riffle, MD;  Location[facility] ENDOSCOPY;  Service: Cardiovascular;  Laterality: N/A;  . Cardioversion N/A 02/17/2014    Procedure: CARDIOVERSION;  Surgeon: Pricilla Riffle, MD;  Location: [facility] ENDOSCOPY;  Service:  Cardiovascular;  Laterality: N/A;    Allergies  Allergies  Allergen Reactions  . Amoxicillin Hives  . Avelox [Moxifloxacin Hcl In Nacl] Other (See Comments)    Mental breakdown.  [name] Hydrocodone Itching  . Penicillins Hives  . Sulfa Antibiotics Other (See Comments)    unknown    HPI  58 year old female with prior medical history of coronary artery disease, tension, hyperlipidemia, stroke who is coming for concern of progressively worsening shortness of breath and orthopnea. The patient has a prior medical history of coronary artery bypass disease in 2008 and she was followed in our clinic by Dr. Riley Kill until approximately 20 years ago. She afterwards had a stroke became homeless and moved to [place].  She not get back to [facility] about 6 months ago and was seen in urgent care for a spider bite which she was told by a physician that her heartbeat are irregular. The patient mentions that over the last months she has noted progressively worsening shortness of breath, orthopnea and paroxysmal nocturnal dyspnea in the last week and inability to sleep. She is currently short of breath at rest and gasping for breath she has noticed that her heart rate has been elevated and on exertion could be as  high as 180 beats per minute. She attributed this to smoking, so she quit just 3 days ago and is currently using nicotine patches. She denies any chest pain, syncope.  The patient was admitted to the hospital and cardioverted on 02/17/2014. The patient was found to have mildly impaired left ventricle systolic function.  The patient was discharged on 02/21/2014 in sinus rhythm on Xarelto. The patient showed up in the ER again on June 26 with recurrent A. fib with RVR. She was symptomatic with shortness of breath and fatigue she was started on oral amiodarone. Because of her in her kidney function her Lasix also hold and lisinopril was held as well. She was euvolemic on the discharge day 03/06/2014. She  was hypotensive during the hospital visit and therefore Cardizem was held.  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 No dizziness or syncope. She underwent a sleep study that showed hypoxia with COPD but no hypoventilation or apnea. They also detected a-fib the entire monitoring time with HR 53-64 BPM. She denies palpitations, chest pain, she has baseline stable SOB, walks with a cane. She is complaint with her meds.    Home Medications  Prior to Admission medications   Medication Sig Start Date End Date Taking? Authorizing Provider  ABILIFY 10 MG tablet TAKE 1 TABLET (10 MG TOTAL) BY MOUTH DAILY. 11/29/13  Yes Elvina Sidle, MD  aspirin EC 81 MG tablet Take 1 tablet (81 mg total) by mouth daily. 10/28/13  Yes Phillips Odor, MD  atorvastatin (LIPITOR) 40 MG tablet Take 1 tablet (40 mg total) by mouth daily. 11/16/13  Yes Elvina Sidle, MD  clopidogrel (PLAVIX) 75 MG tablet Take 1 tablet (75 mg total) by mouth daily. 11/16/13  Yes Elvina Sidle, MD  DULoxetine (CYMBALTA) 30 MG capsule Take 2 capsules (60 mg total) by mouth 2 (two) times daily. 11/16/13  Yes Elvina Sidle, MD  lamoTRIgine (LAMICTAL) 100 MG tablet Take 1 tablet (100 mg total) by mouth daily. 11/16/13  Yes Elvina Sidle, MD  levothyroxine (SYNTHROID, LEVOTHROID) 125 MCG tablet Take 1 tablet (125 mcg total) by mouth daily. 11/16/13  Yes Elvina Sidle, MD  lisinopril (PRINIVIL,ZESTRIL) 20 MG tablet TAKE 1 TABLET BY MOUTH DAILY. 11/29/13  Yes Elvina Sidle, MD  topiramate (TOPAMAX) 50 MG tablet Take 1 tablet (50 mg total) by mouth 2 (two) times daily. Start one tablet at night x 1 week then twice daily 01/03/14  Yes Micki Riley, MD  traMADol (ULTRAM) 50 MG tablet Take 2 tablets (100 mg total) by mouth every 6 (six) hours as needed for moderate pain. 01/03/14  Yes Micki Riley, MD    Family History  Family History  Problem Relation Age of Onset  .  COPD Mother   . Heart disease Mother   . Arthritis Mother     Rheumatoid and PMR  . Heart attack Father   . Depression Father   . Hypertension Father   . Osteoporosis Mother     Mom fractured hip  . Diabetes type II Mother     Social History  History   Social History  . Marital Status: Divorced    Spouse Name: N/A    Number of Children: 2  . Years of Education: N/A   Occupational History  . DISABLE     Social History Main Topics  . Smoking status: Former Smoker -- 0.25 packs/day for 30 years  Types: Cigarettes    Quit date: 05/09/2014  . Smokeless tobacco: Never Used  . Alcohol Use: No  . Drug Use: No  . Sexual Activity: Not on file   Other Topics Concern  . Not on file   Social History Narrative   Divorced   After stroke was living with sister and mother after sister asked her to leave. Mom has since deceased    Difficulties over control.    Then moved back in for about 6 weeks.   . tranportaion difficult son  helping   Ex-smoker   Was living at friends  Sister and her had argument and she was told to leave .    She was living in a homeless shelter for the last 3 weeks [facility]    son had help with transportation and medication       Work status regular  before got sick on short-term disability now lost t insurance after the stroke and couldn't work was  denied ssi    2 times.  She is not eligible for Medicaid because she doesn't have dependent children.         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     Review of Systems, as per HPI, otherwise negative General:  No chills, fever, night sweats or weight changes.  Cardiovascular:  No chest pain, dyspnea on exertion, edema, orthopnea, palpitations, paroxysmal nocturnal dyspnea. Dermatological: No rash, lesions/masses Respiratory: No cough,  dyspnea Urologic: No hematuria, dysuria Abdominal:   No nausea, vomiting, diarrhea, bright red blood per rectum, melena, or hematemesis Neurologic:  No visual changes, wkns, changes in mental status. All other systems reviewed and are otherwise negative except as noted above.  Physical Exam  Height 5\' 3"  (1.6 m), weight 173 lb 9.6 oz (78.744 kg).  General: Pleasant, NAD Psych: Normal affect. Neuro: Alert and oriented X 3. Moves all extremities spontaneously. HEENT: Normal  Neck: Supple without bruits or JVD. Lungs:  Resp regular and unlabored, CTA. Heart: RRR no s3, s4, or murmurs. Abdomen: Soft, non-tender, non-distended, BS + x 4.  Extremities: No clubbing, cyanosis or edema. DP/PT/Radials 2+ and equal bilaterally.  Labs:  No results found for this basename: CKTOTAL, CKMB, TROPONINI,  in the last 72 hours Lab Results  Component Value Date   WBC 8.2 05/11/2014   HGB 14.1 05/11/2014   HCT 43.2 05/11/2014   MCV 95.7 05/11/2014   PLT 239.0 03/18/2014       Component Value Date/Time   NA 141 05/15/2014 1442   K 3.3* 05/15/2014 1442   CL 103 05/15/2014 1442   CO2 26 05/15/2014 1442   GLUCOSE 99 05/15/2014 1442   BUN 16 05/15/2014 1442   CREATININE 1.22* 05/15/2014 1442   CREATININE 1.1 03/18/2014 1215   CALCIUM 9.5 05/15/2014 1442   PROT 7.7 05/11/2014 2059   ALBUMIN 4.7 05/11/2014 2059   AST 13 05/11/2014 2059   ALT <8 05/11/2014 2059   ALKPHOS 84 05/11/2014 2059   BILITOT 0.7 05/11/2014 2059   GFRNONAA 47* 05/11/2014 2059   GFRNONAA 49* 03/06/2014 0430   GFRAA 54* 05/11/2014 2059   GFRAA 56* 03/06/2014 0430   Lab Results  Component Value Date   CHOL 237* 11/16/2013   HDL 68 11/16/2013   LDLCALC 152* 11/16/2013   TRIG 85 11/16/2013  Accessory Clinical Findings  Echocardiogram - 12/27/2009 Left ventricle: The cavity size was normal. Wall thickness was increased in a pattern of mild LVH. There was mild focal basal hypertrophy of the septum. Systolic function was normal. The estimated ejection fraction  was in the range of 60% to 65%. Regional wall motion abnormalities cannot be excluded. Doppler parameters are consistent with abnormal left ventricular relaxation (grade 1 diastolic dysfunction). - Mitral valve: Calcified annulus. - Left atrium: The atrium was mildly dilated. - Right ventricle: The cavity size was mildly dilated. - Right atrium: The atrium was mildly dilated. Impressions:  - Technically difficult; LV function is vigorous; focal basal septal hypertrophy but no significant LVOT gradient at rest.  ECG -SR, 64 BPM, STD and negative T waves in the inferolateral leads.    Assessment & Plan  58 year old female  1. New diagnosis of atrial flutter with variable block, currently in SR, on Diltiazem for flutter but appears to be not tolerating low BP, we will stop.   2. Atrial Fibrillation: s/p successful TEE/ DCCV back to NSR. She continues in NS/SB with rates ~ 53-65. stop Diltiazem. Continue Xarelto for stroke prophylaxis.   3. Chronic oral anticoagulation: on Xarelto. No signs of abnormal bleeding. CHADS-Vasc 5.   4. CAD: Denies CP. NST yesterday negative for ischemia. Continue ASA and statin. No BB at this time due to bradycardia.   5. 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 Denies dyspnea. Appears euvolemic on physical exam. Continue PO Lasix + Potassium, ACEI held for CKD, we will recheck today. We will also repeat echocardiogram to re-evaluate LVEF, LVH.  6. Hypokalemia: recheck today.  7. STD and negative T waves in the inferolateral leads -  Same as the last ECG and also negative recent nuclear stress test, no chest pain.   8. HTN: controlled.   9. HLD: Continue statin. She is on high dose Lipitor (80 mg). Last lipid panel was 11/16/13. LDL was not at goal, at 152 mg/dL. Goal in the setting of CAD is <70. Consider rechecking as an OP. If still not at goal, consider adding Zeita.    Lars Masson, MD, [facility] 05/23/2014, 8:22  AM

## 2014-05-23 NOTE — Patient Instructions (Addendum)
Your physician has recommended you make the following change in your medication:Stop taking Diltiazem   Your physician has requested that you have an echocardiogram. Echocardiography is a painless test that uses sound waves to create images of your heart. It provides your doctor with information about the size and shape of your heart and how well your heart's chambers and valves are working. This procedure takes approximately one hour. There are no restrictions for this procedure.  Your physician recommends that you return for lab work in: today  Your physician recommends that you schedule a follow-up appointment in: 2 months.

## 2014-05-24 ENCOUNTER — Other Ambulatory Visit (HOSPITAL_COMMUNITY): Payer: Self-pay

## 2014-05-27 ENCOUNTER — Ambulatory Visit (HOSPITAL_COMMUNITY): Payer: Medicare Other | Attending: Cardiovascular Disease | Admitting: Radiology

## 2014-05-27 DIAGNOSIS — I5021 Acute systolic (congestive) heart failure: Secondary | ICD-10-CM

## 2014-05-27 DIAGNOSIS — I4891 Unspecified atrial fibrillation: Secondary | ICD-10-CM | POA: Diagnosis not present

## 2014-05-27 DIAGNOSIS — I2510 Atherosclerotic heart disease of native coronary artery without angina pectoris: Secondary | ICD-10-CM | POA: Insufficient documentation

## 2014-05-27 DIAGNOSIS — I10 Essential (primary) hypertension: Secondary | ICD-10-CM | POA: Diagnosis not present

## 2014-05-27 DIAGNOSIS — Z87891 Personal history of nicotine dependence: Secondary | ICD-10-CM | POA: Diagnosis not present

## 2014-05-27 DIAGNOSIS — I509 Heart failure, unspecified: Secondary | ICD-10-CM | POA: Diagnosis present

## 2014-05-27 DIAGNOSIS — I429 Cardiomyopathy, unspecified: Secondary | ICD-10-CM

## 2014-05-27 DIAGNOSIS — E669 Obesity, unspecified: Secondary | ICD-10-CM | POA: Diagnosis not present

## 2014-05-27 DIAGNOSIS — I5032 Chronic diastolic (congestive) heart failure: Secondary | ICD-10-CM

## 2014-05-27 NOTE — Progress Notes (Signed)
Echocardiogram performed.  

## 2014-05-30 ENCOUNTER — Encounter: Payer: Self-pay | Admitting: *Deleted

## 2014-05-30 ENCOUNTER — Telehealth: Payer: Self-pay | Admitting: *Deleted

## 2014-05-30 NOTE — Telephone Encounter (Signed)
Message copied by Loa Socks on Mon May 30, 2014  5:32 PM ------      Message from: Lars Masson      Created: Mon May 30, 2014  5:04 PM       Her echo shows improved LVEF, now 55-60%, it was 40% in June.      Please let her know,      Thank you,      KN ------

## 2014-05-30 NOTE — Telephone Encounter (Signed)
Have tried contacting pt on number listed and on [facility] numbers as well and all numbers listed state that either number will accept incoming calls.  Will send a result letter to follow up to pts current residence.

## 2014-06-16 ENCOUNTER — Ambulatory Visit (INDEPENDENT_AMBULATORY_CARE_PROVIDER_SITE_OTHER): Payer: Medicare Other

## 2014-06-16 ENCOUNTER — Ambulatory Visit (INDEPENDENT_AMBULATORY_CARE_PROVIDER_SITE_OTHER): Payer: Medicare Other | Admitting: Emergency Medicine

## 2014-06-16 VITALS — BP 118/82 | HR 73 | Temp 98.5°F | Resp 18 | Ht 64.0 in | Wt 167.0 lb

## 2014-06-16 DIAGNOSIS — I2510 Atherosclerotic heart disease of native coronary artery without angina pectoris: Secondary | ICD-10-CM | POA: Diagnosis not present

## 2014-06-16 DIAGNOSIS — S161XXA Strain of muscle, fascia and tendon at neck level, initial encounter: Secondary | ICD-10-CM

## 2014-06-16 DIAGNOSIS — Z1231 Encounter for screening mammogram for malignant neoplasm of breast: Secondary | ICD-10-CM | POA: Diagnosis not present

## 2014-06-16 DIAGNOSIS — Z779 Other contact with and (suspected) exposures hazardous to health: Secondary | ICD-10-CM | POA: Diagnosis not present

## 2014-06-16 MED ORDER — CYCLOBENZAPRINE HCL 5 MG PO TABS: 5.0000 mg | ORAL_TABLET | Freq: Three times a day (TID) | ORAL | Status: DC | PRN
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
## 2014-06-16 NOTE — Patient Instructions (Signed)

## 2014-07-13 ENCOUNTER — Other Ambulatory Visit: Payer: Self-pay | Admitting: Neurology

## 2014-07-18 ENCOUNTER — Other Ambulatory Visit: Payer: Self-pay

## 2014-07-18 MED ORDER — TRAMADOL HCL 50 MG PO TABS: ORAL_TABLET | ORAL | Status: DC

## 2014-07-20 ENCOUNTER — Ambulatory Visit (INDEPENDENT_AMBULATORY_CARE_PROVIDER_SITE_OTHER): Payer: Medicare Other | Admitting: Psychiatry

## 2014-07-20 ENCOUNTER — Other Ambulatory Visit: Payer: Self-pay | Admitting: Neurology

## 2014-07-20 VITALS — BP 128/82 | HR 78 | Ht 63.0 in | Wt 160.4 lb

## 2014-07-20 DIAGNOSIS — F332 Major depressive disorder, recurrent severe without psychotic features: Secondary | ICD-10-CM

## 2014-07-20 DIAGNOSIS — F32A Depression, unspecified: Secondary | ICD-10-CM

## 2014-07-20 DIAGNOSIS — F329 Major depressive disorder, single episode, unspecified: Secondary | ICD-10-CM

## 2014-07-20 DIAGNOSIS — F331 Major depressive disorder, recurrent, moderate: Secondary | ICD-10-CM

## 2014-07-20 MED ORDER — ARIPIPRAZOLE 5 MG PO TABS: ORAL_TABLET | ORAL | Status: DC

## 2014-07-20 MED ORDER — DULOXETINE HCL 30 MG PO CPEP: ORAL_CAPSULE | ORAL | Status: DC

## 2014-07-20 MED ORDER — LAMOTRIGINE 100 MG PO TABS: 100.0000 mg | ORAL_TABLET | Freq: Every day | ORAL | Status: DC

## 2014-07-20 MED ORDER — BUPROPION HCL ER (XL) 150 MG PO TB24: ORAL_TABLET | ORAL | Status: DC
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
## 2014-07-25 ENCOUNTER — Encounter: Payer: Self-pay | Admitting: Cardiology

## 2014-07-25 ENCOUNTER — Ambulatory Visit (INDEPENDENT_AMBULATORY_CARE_PROVIDER_SITE_OTHER): Payer: Medicare Other | Admitting: Cardiology

## 2014-07-25 VITALS — BP 124/74 | HR 61 | Ht 64.0 in | Wt 162.0 lb

## 2014-07-25 DIAGNOSIS — I2510 Atherosclerotic heart disease of native coronary artery without angina pectoris: Secondary | ICD-10-CM

## 2014-07-25 DIAGNOSIS — I25810 Atherosclerosis of coronary artery bypass graft(s) without angina pectoris: Secondary | ICD-10-CM

## 2014-07-25 MED ORDER — LISINOPRIL 2.5 MG PO TABS: 2.5000 mg | ORAL_TABLET | Freq: Every day | ORAL | Status: DC

## 2014-07-25 NOTE — Progress Notes (Signed)
Patient ID: [name], female   DOB: [birth year], 58 y.o.   MRN: [id number]    Patient Name: [name] Date of Encounter: 07/25/2014  Primary Care Provider:  [name], MD Primary Cardiologist:  Lars Masson  Problem List   Past Medical History  Diagnosis Date  . CAD (coronary artery disease)     a.  cath 09/2010: LAD stent patent, S-Int/dCFX ok, S-PDA ok, L-LAD atretic;  b. Lexiscan Myoview (02/2014):  no ischemia, EF 55%   . Hypertension   . Hx of CABG   . Hx of transesophageal echocardiography (TEE) for monitoring 11/2010    TEE 11/2010: EF 60-65%, BAE, trivial atrial septal shunt;  right heart cath in 10/2010 with elevated R and L heart pressures and diuretic started  . HLD (hyperlipidemia)   . Hypothyroidism   . COPD (chronic obstructive pulmonary disease)   . Pulmonary nodules     repeat CT due in 11/2011  . Acute right MCA stroke 11/07/10  . 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 Eczema   . Depression   . Atrial fibrillation     a. s/p TEE-DCCV 02/2104; b. Xarelto started  . Cardiomyopathy with EF 40% at TEE 02/17/14 (likely tachycardia mediated - Myoview 02/19/14 neg for ischemia with normal EF) 02/18/2014  . 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 Sleep apnea     recent sleep study  in 04/2014 per chart review  shows no significant OSA   Past Surgical History  Procedure Laterality Date  . Coronary artery bypass graft    . Debriment for infection in chest    . Chest wall reconstruction    . Bilateral knee surgery    . Bladder surgery    . Left mastoidectomy    . Hernia repair    . Cath 2012    . Tee without cardioversion N/A 02/17/2014    Procedure: TRANSESOPHAGEAL ECHOCARDIOGRAM (TEE);  Surgeon: Pricilla Riffle, MD;  Location: [facility] ENDOSCOPY;  Service: Cardiovascular;  Laterality: N/A;  . Cardioversion N/A 02/17/2014    Procedure: CARDIOVERSION;  Surgeon: Pricilla Riffle, MD;  Location: [facility] ENDOSCOPY;   Service: Cardiovascular;  Laterality: N/A;    Allergies  Allergies  Allergen Reactions  . Amoxicillin Hives  . Avelox [Moxifloxacin Hcl In Nacl] Other (See Comments)    Mental breakdown.  [name] Hydrocodone Itching  . Penicillins Hives  . Sulfa Antibiotics Other (See Comments)    unknown    HPI  58 year old female with prior medical history of coronary artery disease, hypertension, hyperlipidemia, stroke who is coming for concern of progressively worsening shortness of breath and orthopnea. The patient has a prior medical history of coronary artery bypass disease in 2008 and she was followed in our clinic by Dr. Riley Kill until approximately 20 years ago. She afterwards had a stroke became homeless and moved to [place].  She not get back to [facility] about 6 months ago and was seen in urgent care for a spider bite which she was told by a physician that her heartbeat are irregular. The patient mentions that over the last months she has noted progressively worsening shortness of breath, orthopnea and paroxysmal nocturnal dyspnea in the last week and inability to sleep. She is currently short of breath at rest and gasping for breath she has noticed that her heart rate has been elevated and on exertion could be as  high as 180 beats per minute. She attributed this to smoking, so she quit just 3 days ago and is currently using nicotine patches. She denies any chest pain, syncope.  The patient was admitted to the hospital and cardioverted on 02/17/2014. The patient was found to have mildly impaired left ventricle systolic function.  The patient was discharged on 02/21/2014 in sinus rhythm on Xarelto. The patient showed up in the ER again on June 26 with recurrent A. fib with RVR. She was symptomatic with shortness of breath and fatigue she was started on oral amiodarone. Because of her in her kidney function her Lasix also hold and lisinopril was held as well. She was euvolemic on the discharge day  03/06/2014. She was hypotensive during the hospital visit and therefore Cardizem was held.  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 No dizziness or syncope. She underwent a sleep study that showed hypoxia with COPD but no hypoventilation or apnea. They also detected SR the entire monitoring time with HR 53-64 BPM. She denies palpitations, chest pain, she has baseline stable SOB, walks with a cane. She is complaint with her meds.   07/25/14 - the patient ran out of amiodarone 2 months ago, no palpitations, no chest pain. No LE edema.    Home Medications  Prior to Admission medications   Medication Sig Start Date End Date Taking? Authorizing Provider  ABILIFY 10 MG tablet TAKE 1 TABLET (10 MG TOTAL) BY MOUTH DAILY. 11/29/13  Yes Elvina SidleKurt Lauenstein, MD  aspirin EC 81 MG tablet Take 1 tablet (81 mg total) by mouth daily. 10/28/13  Yes Phillips OdorJeffery Anderson, MD  atorvastatin (LIPITOR) 40 MG tablet Take 1 tablet (40 mg total) by mouth daily. 11/16/13  Yes Elvina SidleKurt Lauenstein, MD  clopidogrel (PLAVIX) 75 MG tablet Take 1 tablet (75 mg total) by mouth daily. 11/16/13  Yes Elvina SidleKurt Lauenstein, MD  DULoxetine (CYMBALTA) 30 MG capsule Take 2 capsules (60 mg total) by mouth 2 (two) times daily. 11/16/13  Yes Elvina SidleKurt Lauenstein, MD  lamoTRIgine (LAMICTAL) 100 MG tablet Take 1 tablet (100 mg total) by mouth daily. 11/16/13  Yes Elvina SidleKurt Lauenstein, MD  levothyroxine (SYNTHROID, LEVOTHROID) 125 MCG tablet Take 1 tablet (125 mcg total) by mouth daily. 11/16/13  Yes Elvina SidleKurt Lauenstein, MD  lisinopril (PRINIVIL,ZESTRIL) 20 MG tablet TAKE 1 TABLET BY MOUTH DAILY. 11/29/13  Yes Elvina SidleKurt Lauenstein, MD  topiramate (TOPAMAX) 50 MG tablet Take 1 tablet (50 mg total) by mouth 2 (two) times daily. Start one tablet at night x 1 week then twice daily 01/03/14  Yes Micki RileyPramod S Sethi, MD  traMADol (ULTRAM) 50 MG tablet Take 2 tablets (100 mg total) by mouth every 6 (six) hours as needed for  moderate pain. 01/03/14  Yes Micki RileyPramod S Sethi, MD    Family History  Family History  Problem Relation Age of Onset  . COPD Mother   . Heart disease Mother   . Arthritis Mother     Rheumatoid and PMR  . Heart attack Father   . Depression Father   . Hypertension Father   . Osteoporosis Mother     Mom fractured hip  . Diabetes type II Mother     Social History  History   Social History  . Marital Status: Divorced    Spouse Name: N/A    Number of Children: 2  . Years of Education: N/A   Occupational History  . DISABLE  Social History Main Topics  . Smoking status: Former Smoker -- 0.25 packs/day for 30 years    Types: Cigarettes    Quit date: 05/09/2014  . Smokeless tobacco: Never Used  . Alcohol Use: No  . Drug Use: No  . Sexual Activity: Not on file   Other Topics Concern  . Not on file   Social History Narrative   Divorced   After stroke was living with sister and mother after sister asked her to leave. Mom has since deceased    Difficulties over control.    Then moved back in for about 6 weeks.   . tranportaion difficult son  helping   Ex-smoker   Was living at friends  Sister and her had argument and she was told to leave .    She was living in a homeless shelter for the last 3 weeks [facility]    son had help with transportation and medication       Work status regular  before got sick on short-term disability now lost t insurance after the stroke and couldn't work was  denied ssi    2 times.  She is not eligible for Medicaid because she doesn't have dependent children.         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     Review of Systems, as per HPI, otherwise negative General:  No chills, fever, night sweats or weight changes.  Cardiovascular:  No chest pain, dyspnea on exertion, edema,  orthopnea, palpitations, paroxysmal nocturnal dyspnea. Dermatological: No rash, lesions/masses Respiratory: No cough, dyspnea Urologic: No hematuria, dysuria Abdominal:   No nausea, vomiting, diarrhea, bright red blood per rectum, melena, or hematemesis Neurologic:  No visual changes, wkns, changes in mental status. All other systems reviewed and are otherwise negative except as noted above.  Physical Exam  Blood pressure 124/74, pulse 61, height 5\' 4"  (1.626 m), weight 162 lb (73.483 kg).  General: Pleasant, NAD Psych: Normal affect. Neuro: Alert and oriented X 3. Moves all extremities spontaneously. HEENT: Normal  Neck: Supple without bruits or JVD. Lungs:  Resp regular and unlabored, CTA. Heart: RRR no s3, s4, or murmurs. Abdomen: Soft, non-tender, non-distended, BS + x 4.  Extremities: No clubbing, cyanosis or edema. DP/PT/Radials 2+ and equal bilaterally.  Labs:  No results for input(s): CKTOTAL, CKMB, TROPONINI in the last 72 hours. Lab Results  Component Value Date   WBC 8.2 05/11/2014   HGB 14.1 05/11/2014   HCT 43.2 05/11/2014   MCV 95.7 05/11/2014   PLT 239.0 03/18/2014       Component Value Date/Time   NA 138 05/23/2014 0854   K 3.8 05/23/2014 0854   CL 105 05/23/2014 0854   CO2 23 05/23/2014 0854   GLUCOSE 82 05/23/2014 0854   BUN 15 05/23/2014 0854   CREATININE 1.1 05/23/2014 0854   CREATININE 1.22* 05/15/2014 1442   CALCIUM 9.0 05/23/2014 0854   PROT 7.0 05/23/2014 0854   ALBUMIN 3.7 05/23/2014 0854   AST 14 05/23/2014 0854   ALT 11 05/23/2014 0854   ALKPHOS 75 05/23/2014 0854   BILITOT 0.4 05/23/2014 0854   GFRNONAA 47* 05/11/2014 2059   GFRNONAA 49* 03/06/2014 0430   GFRAA 54* 05/11/2014 2059   GFRAA 56* 03/06/2014 0430   Lab Results  Component Value Date  CHOL 237* 11/16/2013   HDL 68 11/16/2013   LDLCALC 152* 11/16/2013   TRIG 85 11/16/2013   Accessory Clinical Findings  Echocardiogram - 05/27/2014 Left ventricle: The cavity size was  normal. Wall thickness was increased in a pattern of moderate LVH. Systolic function was normal. The estimated ejection fraction was in the range of 55% to 60%. Wall motion was normal; there were no regional wall motion abnormalities. Features are consistent with a pseudonormal left ventricular filling pattern, with concomitant abnormal relaxation and increased filling pressure (grade 2 diastolic dysfunction). - Left atrium: The atrium was mildly dilated.    Assessment & Plan  58 year old female  1. Chronic Combined Systolic + Diastolic CHF (EF 81% in 02/2014): improved to 55-60% on echo in September 2015, believed to be sec to a-fib with RVR . Denies dyspnea. Appears euvolemic on physical exam. Continue PO Lasix + Potassium, restart lisinopril 2.5 mg po daily (held for CKD before), recheck BMP in 1 month.  2. New diagnosis of atrial flutter with variable block, currently in SR, on Diltiazem for flutter but appears to be not tolerating low BP, we will stop.   3. Atrial Fibrillation: s/p successful TEE/ DCCV back to NSR. She continues in NS/SB with rates ~ 53-65. stop Diltiazem. Continue Xarelto for stroke prophylaxis. We will hold amiodarone.  4. Chronic oral anticoagulation: on Xarelto. No signs of abnormal bleeding. CHADS-Vasc 5.   5. CAD: Denies CP. NST yesterday negative for ischemia. Continue ASA and statin. No BB at this time due to bradycardia.   6. STD and negative T waves in the inferolateral leads -  Same as the last ECG and also negative recent nuclear stress test, no chest pain.   7. HTN: controlled.   8. HLD: Continue statin. She is on high dose Lipitor (80 mg). Last lipid panel was 11/16/13. LDL was not at goal, at 152 mg/dL. Goal in the setting of CAD is <70. Consider rechecking as an OP. If still not at goal, consider adding Zeita.   Encouraged to continue going to the gym.   Follow up in 6 months.  Lars Masson, MD, [facility] 07/25/2014, 8:30  AM

## 2014-07-25 NOTE — Patient Instructions (Signed)
START LISINOPRIL 2.5 MG 1 TABLET DAILY; RX SENT IN  BMET 08/22/14  Your physician wants you to follow-up in: 6 MONTHS WITH DR. Johnell Comings will receive a reminder letter in the mail two months in advance. If you don't receive a letter, please call our office to schedule the follow-up appointment.

## 2014-08-18 ENCOUNTER — Encounter (HOSPITAL_COMMUNITY): Payer: Self-pay | Admitting: Clinical

## 2014-08-18 ENCOUNTER — Ambulatory Visit (INDEPENDENT_AMBULATORY_CARE_PROVIDER_SITE_OTHER): Payer: Medicare Other | Admitting: Clinical

## 2014-08-18 DIAGNOSIS — F332 Major depressive disorder, recurrent severe without psychotic features: Secondary | ICD-10-CM
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
## 2014-08-22 ENCOUNTER — Other Ambulatory Visit: Payer: Self-pay

## 2014-08-25 ENCOUNTER — Encounter (HOSPITAL_COMMUNITY): Payer: Self-pay | Admitting: Clinical

## 2014-08-25 ENCOUNTER — Encounter: Payer: Self-pay | Admitting: Internal Medicine

## 2014-08-25 ENCOUNTER — Ambulatory Visit (INDEPENDENT_AMBULATORY_CARE_PROVIDER_SITE_OTHER): Payer: Medicare Other | Admitting: Internal Medicine

## 2014-08-25 ENCOUNTER — Ambulatory Visit (INDEPENDENT_AMBULATORY_CARE_PROVIDER_SITE_OTHER): Payer: Medicare Other | Admitting: Clinical

## 2014-08-25 DIAGNOSIS — R748 Abnormal levels of other serum enzymes: Secondary | ICD-10-CM | POA: Diagnosis not present

## 2014-08-25 DIAGNOSIS — I2510 Atherosclerotic heart disease of native coronary artery without angina pectoris: Secondary | ICD-10-CM

## 2014-08-25 DIAGNOSIS — F39 Unspecified mood [affective] disorder: Secondary | ICD-10-CM

## 2014-08-25 DIAGNOSIS — R400 Somnolence: Secondary | ICD-10-CM | POA: Diagnosis not present

## 2014-08-25 DIAGNOSIS — L682 Localized hypertrichosis: Secondary | ICD-10-CM | POA: Diagnosis not present

## 2014-08-25 DIAGNOSIS — I429 Cardiomyopathy, unspecified: Secondary | ICD-10-CM

## 2014-08-25 DIAGNOSIS — E876 Hypokalemia: Secondary | ICD-10-CM | POA: Diagnosis not present

## 2014-08-25 DIAGNOSIS — R229 Localized swelling, mass and lump, unspecified: Secondary | ICD-10-CM

## 2014-08-25 DIAGNOSIS — F332 Major depressive disorder, recurrent severe without psychotic features: Secondary | ICD-10-CM | POA: Diagnosis not present

## 2014-08-25 DIAGNOSIS — I25810 Atherosclerosis of coronary artery bypass graft(s) without angina pectoris: Secondary | ICD-10-CM

## 2014-08-25 DIAGNOSIS — L678 Other hair color and hair shaft abnormalities: Secondary | ICD-10-CM

## 2014-08-25 LAB — TSH: TSH: 2.26 u[IU]/mL (ref 0.35–4.50)

## 2014-08-25 LAB — BASIC METABOLIC PANEL
BUN: 17 mg/dL (ref 6–23)
CHLORIDE: 105 meq/L (ref 96–112)
CO2: 25 mEq/L (ref 19–32)
Calcium: 8.6 mg/dL (ref 8.4–10.5)
Creatinine, Ser: 1.4 mg/dL — ABNORMAL HIGH (ref 0.4–1.2)
GFR: 42.61 mL/min — ABNORMAL LOW (ref >60.00)
Glucose, Bld: 89 mg/dL (ref 70–99)
Potassium: 2.7 mEq/L — CL (ref 3.5–5.1)
SODIUM: 142 meq/L (ref 135–145)

## 2014-08-25 LAB — MAGNESIUM: MAGNESIUM: 1.1 mg/dL — AB (ref 1.5–2.5)

## 2014-08-25 NOTE — Patient Instructions (Signed)
Labs today to check hormone levels regarding the hair growth   We can refer to  Dermatology in the meantime.   Plan on colonoscopy next year or soi unless having problem
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
## 2014-08-26 LAB — TESTOSTERONE, FREE, TOTAL, SHBG
SEX HORMONE BINDING: 115 nmol/L — AB (ref 18–114)
TESTOSTERONE FREE: 4.3 pg/mL (ref 0.6–6.8)
TESTOSTERONE-% FREE: 0.7 % (ref 0.4–2.4)
TESTOSTERONE: 59 ng/dL (ref 10–70)

## 2014-08-26 LAB — DHEA-SULFATE: DHEA-SO4: 50 ug/dL (ref 8–188)

## 2014-08-26 NOTE — Progress Notes (Signed)
   THERAPIST PROGRESS NOTE  Session Time: 12:30 - 1:30 60 min  Participation Level: Active  Behavioral Response: CasualAlertDepressed  Type of Therapy: Individual Therapy  Treatment Goals addressed: Anxiety  And Depression  Interventions: CBT and Grounding techniques  Summary: [name] is a 58 y.o. female who presents with Major Depressive Disorder.   Suicidal/Homicidal: Nowithout intent/plan  Therapist Response:  [name] met with clinician for an individual therapy session. [name] discussed her current life events and her mental health symptoms. [name] shared that she believed her medication ( Wellbutrin) is causing her her sexual problems. Client and clinician discussed her concerns and clinician suggested that she discuss her options with her psychiatrist. [name] and clinician discussed how to share about the dysfunction with her partner.  [name] and clinician discussed herthought process. Clinician introduced grounding techniques as a way of interrupting a negative thought cycle. Client and clinician practiced a grounding technique together. Client and clinciian discussed the practice, application, and purpose. [name] agreed to practice the grounding technique daily until next session.  Clinician introduced cbt and client and clinician discussed how our thoughts inform our emotions and experience. Client and clinician agreed to discuss the topic further at future session.   Plan: Return again in 1 weeks.  Diagnosis: Axis I: MAjor Depressive Disorder      Ladarrell Cornwall A, LCSW 08/26/2014

## 2014-08-30 ENCOUNTER — Other Ambulatory Visit: Payer: Self-pay | Admitting: Family Medicine

## 2014-08-30 ENCOUNTER — Encounter: Payer: Self-pay | Admitting: Internal Medicine

## 2014-08-30 MED ORDER — MAGNESIUM OXIDE 400 MG PO TABS: 400.0000 mg | ORAL_TABLET | Freq: Two times a day (BID) | ORAL | Status: DC

## 2014-08-30 MED ORDER — POTASSIUM CHLORIDE CRYS ER 20 MEQ PO TBCR: 80.0000 meq | EXTENDED_RELEASE_TABLET | Freq: Every day | ORAL | Status: DC

## 2014-09-01 ENCOUNTER — Ambulatory Visit (HOSPITAL_COMMUNITY): Payer: Medicare Other | Admitting: Clinical

## 2014-09-08 ENCOUNTER — Ambulatory Visit (HOSPITAL_COMMUNITY): Payer: Self-pay | Admitting: Clinical

## 2014-09-13 ENCOUNTER — Encounter: Payer: Self-pay | Admitting: Family Medicine

## 2014-09-15 ENCOUNTER — Other Ambulatory Visit: Payer: Self-pay | Admitting: Family Medicine

## 2014-09-15 DIAGNOSIS — E876 Hypokalemia: Secondary | ICD-10-CM

## 2014-09-19 ENCOUNTER — Other Ambulatory Visit (INDEPENDENT_AMBULATORY_CARE_PROVIDER_SITE_OTHER): Payer: Medicare Other

## 2014-09-19 DIAGNOSIS — E876 Hypokalemia: Secondary | ICD-10-CM

## 2014-09-19 DIAGNOSIS — I25810 Atherosclerosis of coronary artery bypass graft(s) without angina pectoris: Secondary | ICD-10-CM

## 2014-09-19 LAB — BASIC METABOLIC PANEL
BUN: 16 mg/dL (ref 6–23)
CO2: 24 mEq/L (ref 19–32)
Calcium: 8.6 mg/dL (ref 8.4–10.5)
Chloride: 106 mEq/L (ref 96–112)
Creatinine, Ser: 1.5 mg/dL — ABNORMAL HIGH (ref 0.4–1.2)
GFR: 37.47 mL/min — ABNORMAL LOW (ref >60.00)
Glucose, Bld: 71 mg/dL (ref 70–99)
Potassium: 4 mEq/L (ref 3.5–5.1)
Sodium: 138 mEq/L (ref 135–145)

## 2014-09-19 LAB — MAGNESIUM: Magnesium: 1.2 mg/dL — ABNORMAL LOW (ref 1.5–2.5)

## 2014-09-20 ENCOUNTER — Telehealth: Payer: Self-pay | Admitting: *Deleted

## 2014-09-20 NOTE — Telephone Encounter (Signed)
-----   Message from Katarina H Nelson, MD sent at 09/20/2014 12:50 PM EST ----- Normal K, elevated Crea, discontinue lisinopril. Have her follow with me in March with BMP and echo. 

## 2014-09-21 ENCOUNTER — Telehealth: Payer: Self-pay | Admitting: *Deleted

## 2014-09-21 DIAGNOSIS — R7989 Other specified abnormal findings of blood chemistry: Secondary | ICD-10-CM

## 2014-09-21 DIAGNOSIS — I25810 Atherosclerosis of coronary artery bypass graft(s) without angina pectoris: Secondary | ICD-10-CM

## 2014-09-21 DIAGNOSIS — I272 Pulmonary hypertension, unspecified: Secondary | ICD-10-CM

## 2014-09-21 DIAGNOSIS — I483 Typical atrial flutter: Secondary | ICD-10-CM

## 2014-09-21 DIAGNOSIS — I5032 Chronic diastolic (congestive) heart failure: Secondary | ICD-10-CM

## 2014-09-21 DIAGNOSIS — E785 Hyperlipidemia, unspecified: Secondary | ICD-10-CM

## 2014-09-21 DIAGNOSIS — I952 Hypotension due to drugs: Secondary | ICD-10-CM

## 2014-09-21 NOTE — Telephone Encounter (Signed)
Contacted the pt to inform her of her lab results per Dr Delton See and new orders.  Informed the pt that per Dr Delton See the pt had normal K, elevated Creatinine level.  Informed the pt that per Dr Delton See she should discontinue her Lisinopril and come in for a OV in March with a BMP and echo prior to that visit.  Informed the pt that I will place these orders in the computer and send the schedulers a message to contact the pt and set these appts up.  Pt verbalized understanding and agrees with this plan.

## 2014-09-21 NOTE — Telephone Encounter (Signed)
-----   Message from Lars Masson, MD sent at 09/20/2014 12:50 PM EST ----- Normal K, elevated Crea, discontinue lisinopril. Have her follow with me in March with BMP and echo.

## 2014-09-27 ENCOUNTER — Encounter (HOSPITAL_COMMUNITY): Payer: Self-pay | Admitting: Clinical

## 2014-09-27 ENCOUNTER — Ambulatory Visit (INDEPENDENT_AMBULATORY_CARE_PROVIDER_SITE_OTHER): Payer: Medicare Other | Admitting: Clinical

## 2014-09-27 DIAGNOSIS — F332 Major depressive disorder, recurrent severe without psychotic features: Secondary | ICD-10-CM

## 2014-09-27 NOTE — Progress Notes (Signed)
   THERAPIST PROGRESS NOTE  Session Time:  11:00 - 11:57  Participation Level: Active  Behavioral Response: CasualAlertDepressed  Type of Therapy: Individual Therapy  Treatment Goals addressed: emotional regulation skills, calming skills   Interventions: CBT, Motivational Interviewing and Other: Mindfullness and grounding techniques  Summary: [name] is [name] 59 y.o. female who presents with Major depressive disorder, recurrent, severe without psychotic features .   Suicidal/Homicidal: Nowithout intent/plan  Therapist Response:  [name] met with clinician for an individual session. [name] discussed her psychiatric symptoms, her current life events and her homework. [name] shared that she had been feeling more depressed. She shared that next week is her birthday and her boyfriend has not yet made it home to visit her. She shared that she had told him if he wasn't [name] by her birthday then she would like to end the relationship. [name] discussed her thoughts and emotions about the relationship. Client and clinician discussed how the relationship compared to the kind of relationship she would like to have. [name] had the insight that the relationship was not what she wanted and that if she is [name] relationship she deserves someone who is available. Client and clinician discussed the cost and the benefits of the relationship. Client and clinician discussed the challenges of ending something with someone you care about. [name] and clinician discussed short term emotions verses long term. [name] and clinician discussed her homework. [name] and clinician discussed her grounding techniques and how to use them to help her through her difficult emotions.   Plan: Return again in 1 week.  Diagnosis: Axis I: Major depressive disorder, recurrent, severe without psychotic features         Tina Kawabata A, LCSW 09/27/2014

## 2014-09-28 ENCOUNTER — Ambulatory Visit (INDEPENDENT_AMBULATORY_CARE_PROVIDER_SITE_OTHER): Payer: Medicare Other | Admitting: Psychiatry

## 2014-09-28 VITALS — BP 141/87 | HR 77 | Ht 64.0 in | Wt 157.2 lb

## 2014-09-28 DIAGNOSIS — F332 Major depressive disorder, recurrent severe without psychotic features: Secondary | ICD-10-CM

## 2014-09-28 MED ORDER — ARIPIPRAZOLE 5 MG PO TABS: ORAL_TABLET | ORAL | Status: DC

## 2014-09-28 MED ORDER — BUPROPION HCL ER (XL) 150 MG PO TB24: ORAL_TABLET | ORAL | Status: DC

## 2014-09-28 NOTE — Progress Notes (Signed)
[facility] MD Progress Note  09/28/2014 4:06 PM [name]  MRN:  [id number] Subjective:  Fair At this time the patient has made some changes in her medicines per our direction. Her mood is actually fairly good. She describes a lot of anxiety but mainly its round finances. She denies daily depression. The patient enjoys the computer and reading. She is sleeping and eating well. She's got fairly good energy. She shows no evidence of psychosis. It should be noted this patient has never been manic. She was on a number of different mood stabilizers for reasons that are not clear. Today the patient requests to discontinue her Lamictal and I agreed with her. The patient takes 5 mg of Abilify and I agreed also to reduce that to half the dose. The patient did switch from Cymbalta to Wellbutrin and seems to be working fairly well. The patient takes tramadol for pain. Unfortunately her son was not here today but the patient wanted to speak about sexual dysfunction. I shared with her the Wellbutrin doesn't cause that. I said none of the medicines that I was giving her was causing that. She would need to look at her other medications. The patient now is in a relationship and I think she sexually active. Principal Problem: @PPROB @ Diagnosis:   Patient Active Problem List   Diagnosis Date Noted  . Abnormal facial hair [L68.2] 05/22/2014  . Hypokalemia [E87.6] 05/22/2014  . History of stroke with residual deficit [I69.30] 05/22/2014  . History of recent fall [Z91.81] 05/22/2014  . Somnolence [R40.0] 05/22/2014  . Creatinine elevation [R74.8] 05/22/2014  . Excessive daytime sleepiness [G47.19] 03/28/2014  . Mood disorder [F39] 03/20/2014  . High risk medication use [Z79.899] 03/20/2014  . Unspecified essential hypertension [I10] 03/20/2014  . Anemia, unspecified [D64.9] 03/20/2014  . Tobacco use disorder [Z72.0] 03/20/2014  . Atrial flutter [I48.92] 03/10/2014  . Atrial fibrillation with RVR [I48.91] 03/03/2014   . AKI (acute kidney injury) [N17.9] 03/03/2014  . Hypotension [I95.9] 03/03/2014  . Pulmonary hypertension [I27.0] 03/03/2014  . COPD (chronic obstructive pulmonary disease) [J44.9] 03/03/2014  . History of right MCA stroke [Z86.73]   . Long-term (current) use of anticoagulants [Z79.01]   . Cardiomyopathy with EF 40% at TEE 02/17/14 (likely tachycardia mediated - Myoview 02/19/14 neg for ischemia with normal EF) [I42.9] 02/18/2014  . Hyperlipidemia [E78.5] 02/18/2014  . Coronary artery disease s/p CABG and prior PCI to LAD [I25.10] 02/16/2014  . Homelessness [Z59.0] 11/12/2011  . Chronic diastolic heart failure [I50.32]   . Pulmonary nodules [R91.8]   . ASTHMA [J45.909] 10/28/2006  . Hypothyroidism [E03.9]   . Obstructive sleep apnea [G47.33]    Total Time spent with patient: 30 minutes   Past Medical History:  Past Medical History  Diagnosis Date  . CAD (coronary artery disease)     a.  cath 09/2010: LAD stent patent, S-Int/dCFX ok, S-PDA ok, L-LAD atretic;  b. Lexiscan Myoview (02/2014):  no ischemia, EF 55%   . Hypertension   . Hx of CABG   . Hx of transesophageal echocardiography (TEE) for monitoring 11/2010    TEE 11/2010: EF 60-65%, BAE, trivial atrial septal shunt;  right heart cath in 10/2010 with elevated R and L heart pressures and diuretic started  . HLD (hyperlipidemia)   . Hypothyroidism   . COPD (chronic obstructive pulmonary disease)   . Pulmonary nodules     repeat CT due in 11/2011  . Acute right MCA stroke 11/07/10  . Hx MRSA infection  Chest wall syndrome post CABG  . Eczema   . Depression   . Atrial fibrillation     a. s/p TEE-DCCV 02/2104; b. Xarelto started  . Cardiomyopathy with EF 40% at TEE 02/17/14 (likely tachycardia mediated - Myoview 02/19/14 neg for ischemia with normal EF) 02/18/2014  . 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 Sleep apnea     recent sleep study  in 04/2014 per chart review  shows no significant OSA     Past Surgical History  Procedure Laterality Date  . Coronary artery bypass graft    . Debriment for infection in chest    . Chest wall reconstruction    . Bilateral knee surgery    . Bladder surgery    . Left mastoidectomy    . Hernia repair    . Cath 2012    . Tee without cardioversion N/A 02/17/2014    Procedure: TRANSESOPHAGEAL ECHOCARDIOGRAM (TEE);  Surgeon: Pricilla Riffle, MD;  Location: [facility] ENDOSCOPY;  Service: Cardiovascular;  Laterality: N/A;  . Cardioversion N/A 02/17/2014    Procedure: CARDIOVERSION;  Surgeon: Pricilla Riffle, MD;  Location: [facility] ENDOSCOPY;  Service: Cardiovascular;  Laterality: N/A;   Family History:  Family History  Problem Relation Age of Onset  . COPD Mother   . Heart disease Mother   . Arthritis Mother     Rheumatoid and PMR  . Osteoporosis Mother     Mom fractured hip  . Diabetes type II Mother   . Depression Mother   . Heart attack Father   . Depression Father   . Hypertension Father   . Alcohol abuse Father   . Depression Sister   . Anxiety disorder Sister   . Drug abuse Sister    Social History:  History  Alcohol Use No     History  Drug Use No    History   Social History  . Marital Status: Divorced    Spouse Name: N/A    Number of Children: 2  . Years of Education: N/A   Occupational History  . DISABLE     Social History Main Topics  . Smoking status: Former Smoker -- 0.25 packs/day for 30 years    Types: Cigarettes    Quit date: 05/09/2014  . Smokeless tobacco: Never Used  . Alcohol Use: No  . Drug Use: No  . Sexual Activity: Not Currently   Other Topics Concern  . Not on file   Social History Narrative   Divorced   After stroke was living with sister and mother after sister asked her to leave. Mom has since deceased    Difficulties over control.    Then moved back in for about 6 weeks.   . tranportaion difficult son  helping   Ex-smoker   Was living at friends  Sister and her had argument and she was told to leave .     She was living in a homeless shelter for the last 3 weeks [facility]    son had help with transportation and medication       Work status regular  before got sick on short-term disability now lost t insurance after the stroke and couldn't work was  denied ssi    2 times.  She is not eligible for Medicaid because she doesn't have dependent children.         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   Lives in  rented house 2 house mates males keep tp self has her own b room  Doing well    Additional History:    Sleep: Good  Appetite:  Good   Assessment:   Musculoskeletal: Strength & Muscle Tone:  Gait & Station:  Patient leans:    Psychiatric Specialty Exam: Physical Exam  ROS  Blood pressure 141/87, pulse 77, height 5\' 4"  (1.626 m), weight 157 lb 3.2 oz (71.305 kg).Body mass index is 26.97 kg/(m^2).  General Appearance: Fairly Groomed  Patent attorney::  Good  Speech:  Normal Rate  Volume:  Normal  Mood:  Anxious  Affect:  Congruent  Thought Process:  Goal Directed  Orientation:  Full (Time, Place, and Person)  Thought Content:  WDL  Suicidal Thoughts:  No  Homicidal Thoughts:  No  Memory:  NA  Judgement:  Good  Insight:  Good  Psychomotor Activity:  Normal  Concentration:  Good  Recall:  Good  Fund of Knowledge:Good  Language: Good  Akathisia:  No  Handed:  Right  AIMS (if indicated):     Assets:  Desire for Improvement  ADL's:  Intact  Cognition: WNL  Sleep:        Current Medications: Current Outpatient Prescriptions  Medication Sig Dispense Refill  . albuterol (PROVENTIL HFA;VENTOLIN HFA) 108 (90 BASE) MCG/ACT inhaler Inhale 2 puffs into the lungs every 6 (six) hours as needed for wheezing or shortness of breath. 1 Inhaler 2  . ARIPiprazole (ABILIFY) 5 MG tablet 1 qam 30 tablet 5  . atorvastatin  (LIPITOR) 80 MG tablet Take 0.5 tablets (40 mg total) by mouth daily. 30 tablet 11  . buPROPion (WELLBUTRIN XL) 150 MG 24 hr tablet 1 qam  For 5 days then 2  qam 60 tablet 5  . cyclobenzaprine (FLEXERIL) 5 MG tablet Take 1 tablet (5 mg total) by mouth 3 (three) times daily as needed for muscle spasms. 30 tablet 1  . DULoxetine (CYMBALTA) 30 MG capsule 1 qam for 2 weeks  Then DC 14 capsule 0  . furosemide (LASIX) 40 MG tablet Take 0.5 tablets (20 mg total) by mouth daily. 60 tablet 5  . lamoTRIgine (LAMICTAL) 100 MG tablet Take 1 tablet (100 mg total) by mouth daily. 30 tablet 11  . levothyroxine (SYNTHROID, LEVOTHROID) 125 MCG tablet Take 1 tablet (125 mcg total) by mouth daily. 30 tablet 11  . magnesium oxide (MAG-OX) 400 MG tablet Take 1 tablet (400 mg total) by mouth 2 (two) times daily. 60 tablet 1  . potassium chloride SA (K-DUR,KLOR-CON) 20 MEQ tablet Take 4 tablets (80 mEq total) by mouth daily. 120 tablet 1  . rivaroxaban (XARELTO) 20 MG TABS tablet Take 1 tablet (20 mg total) by mouth daily with supper. 30 tablet 10  . topiramate (TOPAMAX) 50 MG tablet 50mg  in am and 100mg  at night 100 tablet 3  . traMADol (ULTRAM) 50 MG tablet TAKE 2 TABLETS BY MOUTH EVERY 6 HOURS AS NEEDED FOR MODERATE PAIN 30 tablet 5   No current facility-administered medications for this visit.    Lab Results: No results found for this or any previous visit (from the past 48 hour(s)).  Physical Findings: AIMS:  , ,  ,  ,    CIWA:    COWS:     Treatment Plan Summary: Patient is doing well.  She'll continue taking 300 mg Wellbutrin. To continue the Abilify but will take half of a 5 mg pill. At this time she will discontinue her Lamictal. This patient to return to see me in 2 months   Medical Decision Making:  Established Problem, Stable/Improving (1) Problem Points:  Established problem, worsening (2) Data Points:  Review of medication regiment & side effects (2)    Shawntay Prest IRVING 09/28/2014, 4:06  PM

## 2014-10-07 ENCOUNTER — Ambulatory Visit (INDEPENDENT_AMBULATORY_CARE_PROVIDER_SITE_OTHER): Payer: Medicare Other | Admitting: Clinical

## 2014-10-07 ENCOUNTER — Encounter (HOSPITAL_COMMUNITY): Payer: Self-pay | Admitting: Clinical

## 2014-10-07 DIAGNOSIS — F332 Major depressive disorder, recurrent severe without psychotic features: Secondary | ICD-10-CM

## 2014-10-07 NOTE — Progress Notes (Signed)
   THERAPIST PROGRESS NOTE  Session Time: 1:30 -2:25  Participation Level: Active  Behavioral Response: CasualAlertDepressed  Type of Therapy: Individual Therapy  Treatment Goals addressed: Depression, coping skills, emotional regulation  Interventions: CBT and Motivational Interviewing  Summary: [name] is a 59 y.o. female who presents with Major depressive disorder, recurrent, severe without psychotic features  Suicidal/Homicidal: Nowithout intent/plan  Therapist Response: Alexzandria met with clinician for individual session. [name] discussed her mental health and her current life stressors. [name] shared that she had broken up with her boyfriend, that she was behind on bills and was concerned about her son. [name] discussed the coping skills she was currently using. Client and clinician discussed additional healthy skills to use. [name] mad a list of her stresses and listed next to them what she could do about them and when. [name] had the insight that while everything wouldn't be magically fixed she would be able to make progress toward resolving the things that where causing her stress. [name] set forth a plan. Client and clinician discussed the plan. [name] agreed to continue her homework until next session  Plan: Return again in 1 week.  Diagnosis: Axis I: Major depressive disorder, recurrent, severe without psychotic features    Orry Sigl A, LCSW 10/07/2014

## 2014-10-10 ENCOUNTER — Ambulatory Visit: Payer: Medicare Other | Admitting: Neurology

## 2014-10-10 DIAGNOSIS — I4819 Other persistent atrial fibrillation: Secondary | ICD-10-CM

## 2014-10-10 HISTORY — DX: Other persistent atrial fibrillation: I48.19

## 2014-10-11 ENCOUNTER — Encounter (HOSPITAL_COMMUNITY): Payer: Self-pay | Admitting: Physical Medicine and Rehabilitation

## 2014-10-11 ENCOUNTER — Inpatient Hospital Stay (HOSPITAL_COMMUNITY)
Admission: EM | Admit: 2014-10-11 | Discharge: 2014-10-14 | DRG: 308 | Disposition: A | Discharge status: Home Health | Payer: Medicare Other | Attending: Internal Medicine | Admitting: Internal Medicine

## 2014-10-11 ENCOUNTER — Ambulatory Visit: Payer: Medicare Other | Admitting: Neurology

## 2014-10-11 ENCOUNTER — Emergency Department (HOSPITAL_COMMUNITY): Payer: Medicare Other

## 2014-10-11 DIAGNOSIS — Z951 Presence of aortocoronary bypass graft: Secondary | ICD-10-CM

## 2014-10-11 DIAGNOSIS — E785 Hyperlipidemia, unspecified: Secondary | ICD-10-CM | POA: Diagnosis present

## 2014-10-11 DIAGNOSIS — I248 Other forms of acute ischemic heart disease: Secondary | ICD-10-CM | POA: Diagnosis present

## 2014-10-11 DIAGNOSIS — G4719 Other hypersomnia: Secondary | ICD-10-CM | POA: Diagnosis present

## 2014-10-11 DIAGNOSIS — I480 Paroxysmal atrial fibrillation: Secondary | ICD-10-CM | POA: Diagnosis not present

## 2014-10-11 DIAGNOSIS — Z87891 Personal history of nicotine dependence: Secondary | ICD-10-CM | POA: Diagnosis not present

## 2014-10-11 DIAGNOSIS — I25810 Atherosclerosis of coronary artery bypass graft(s) without angina pectoris: Secondary | ICD-10-CM | POA: Diagnosis not present

## 2014-10-11 DIAGNOSIS — I10 Essential (primary) hypertension: Secondary | ICD-10-CM | POA: Diagnosis present

## 2014-10-11 DIAGNOSIS — I959 Hypotension, unspecified: Secondary | ICD-10-CM | POA: Diagnosis present

## 2014-10-11 DIAGNOSIS — I5032 Chronic diastolic (congestive) heart failure: Secondary | ICD-10-CM | POA: Diagnosis present

## 2014-10-11 DIAGNOSIS — Z66 Do not resuscitate: Secondary | ICD-10-CM | POA: Diagnosis present

## 2014-10-11 DIAGNOSIS — Z885 Allergy status to narcotic agent status: Secondary | ICD-10-CM

## 2014-10-11 DIAGNOSIS — I2510 Atherosclerotic heart disease of native coronary artery without angina pectoris: Secondary | ICD-10-CM | POA: Diagnosis present

## 2014-10-11 DIAGNOSIS — J9601 Acute respiratory failure with hypoxia: Secondary | ICD-10-CM | POA: Diagnosis not present

## 2014-10-11 DIAGNOSIS — Z880 Allergy status to penicillin: Secondary | ICD-10-CM

## 2014-10-11 DIAGNOSIS — Z833 Family history of diabetes mellitus: Secondary | ICD-10-CM

## 2014-10-11 DIAGNOSIS — F39 Unspecified mood [affective] disorder: Secondary | ICD-10-CM | POA: Diagnosis present

## 2014-10-11 DIAGNOSIS — Z9861 Coronary angioplasty status: Secondary | ICD-10-CM

## 2014-10-11 DIAGNOSIS — E876 Hypokalemia: Secondary | ICD-10-CM | POA: Diagnosis present

## 2014-10-11 DIAGNOSIS — E039 Hypothyroidism, unspecified: Secondary | ICD-10-CM | POA: Diagnosis present

## 2014-10-11 DIAGNOSIS — E279 Disorder of adrenal gland, unspecified: Secondary | ICD-10-CM | POA: Diagnosis not present

## 2014-10-11 DIAGNOSIS — R339 Retention of urine, unspecified: Secondary | ICD-10-CM | POA: Diagnosis not present

## 2014-10-11 DIAGNOSIS — Z825 Family history of asthma and other chronic lower respiratory diseases: Secondary | ICD-10-CM | POA: Diagnosis not present

## 2014-10-11 DIAGNOSIS — Z881 Allergy status to other antibiotic agents status: Secondary | ICD-10-CM

## 2014-10-11 DIAGNOSIS — D509 Iron deficiency anemia, unspecified: Secondary | ICD-10-CM | POA: Diagnosis present

## 2014-10-11 DIAGNOSIS — Z8614 Personal history of Methicillin resistant Staphylococcus aureus infection: Secondary | ICD-10-CM | POA: Diagnosis not present

## 2014-10-11 DIAGNOSIS — I4891 Unspecified atrial fibrillation: Secondary | ICD-10-CM | POA: Diagnosis present

## 2014-10-11 DIAGNOSIS — Z8262 Family history of osteoporosis: Secondary | ICD-10-CM | POA: Diagnosis not present

## 2014-10-11 DIAGNOSIS — I6931 Cognitive deficits following cerebral infarction: Secondary | ICD-10-CM | POA: Diagnosis not present

## 2014-10-11 DIAGNOSIS — R944 Abnormal results of kidney function studies: Secondary | ICD-10-CM | POA: Diagnosis not present

## 2014-10-11 DIAGNOSIS — Z811 Family history of alcohol abuse and dependence: Secondary | ICD-10-CM

## 2014-10-11 DIAGNOSIS — N179 Acute kidney failure, unspecified: Secondary | ICD-10-CM | POA: Diagnosis present

## 2014-10-11 DIAGNOSIS — Z8261 Family history of arthritis: Secondary | ICD-10-CM

## 2014-10-11 DIAGNOSIS — J449 Chronic obstructive pulmonary disease, unspecified: Secondary | ICD-10-CM | POA: Diagnosis present

## 2014-10-11 DIAGNOSIS — Z8249 Family history of ischemic heart disease and other diseases of the circulatory system: Secondary | ICD-10-CM | POA: Diagnosis not present

## 2014-10-11 DIAGNOSIS — Z7901 Long term (current) use of anticoagulants: Secondary | ICD-10-CM

## 2014-10-11 DIAGNOSIS — N289 Disorder of kidney and ureter, unspecified: Secondary | ICD-10-CM | POA: Diagnosis not present

## 2014-10-11 DIAGNOSIS — Z818 Family history of other mental and behavioral disorders: Secondary | ICD-10-CM

## 2014-10-11 DIAGNOSIS — R0600 Dyspnea, unspecified: Secondary | ICD-10-CM | POA: Diagnosis not present

## 2014-10-11 DIAGNOSIS — E538 Deficiency of other specified B group vitamins: Secondary | ICD-10-CM | POA: Diagnosis present

## 2014-10-11 DIAGNOSIS — R079 Chest pain, unspecified: Secondary | ICD-10-CM | POA: Diagnosis not present

## 2014-10-11 DIAGNOSIS — Z882 Allergy status to sulfonamides status: Secondary | ICD-10-CM | POA: Diagnosis not present

## 2014-10-11 HISTORY — DX: Headache, unspecified: R51.9

## 2014-10-11 HISTORY — DX: Gastro-esophageal reflux disease without esophagitis: K21.9

## 2014-10-11 HISTORY — DX: Headache: R51

## 2014-10-11 LAB — COMPREHENSIVE METABOLIC PANEL
ALBUMIN: 4.4 g/dL (ref 3.5–5.2)
ALT: 9 U/L (ref 0–35)
AST: 18 U/L (ref 0–37)
Alkaline Phosphatase: 85 U/L (ref 39–117)
Anion gap: 10 (ref 5–15)
BUN: 29 mg/dL — ABNORMAL HIGH (ref 6–23)
CALCIUM: 9.3 mg/dL (ref 8.4–10.5)
CO2: 30 mmol/L (ref 19–32)
Chloride: 98 mmol/L (ref 96–112)
Creatinine, Ser: 2.21 mg/dL — ABNORMAL HIGH (ref 0.50–1.10)
GFR calc Af Amer: 27 mL/min — ABNORMAL LOW (ref >90)
GFR calc non Af Amer: 24 mL/min — ABNORMAL LOW (ref >90)
GLUCOSE: 118 mg/dL — AB (ref 70–99)
Potassium: 2.4 mmol/L — CL (ref 3.5–5.1)
SODIUM: 138 mmol/L (ref 135–145)
Total Bilirubin: 0.6 mg/dL (ref 0.3–1.2)
Total Protein: 7.4 g/dL (ref 6.0–8.3)

## 2014-10-11 LAB — CBC WITH DIFFERENTIAL/PLATELET
BASOS ABS: 0 10*3/uL (ref 0.0–0.1)
Basophils Relative: 0 % (ref 0–1)
Eosinophils Absolute: 0.1 10*3/uL (ref 0.0–0.7)
Eosinophils Relative: 1 % (ref 0–5)
HCT: 40.8 % (ref 36.0–46.0)
Hemoglobin: 14.8 g/dL (ref 12.0–15.0)
LYMPHS PCT: 14 % (ref 12–46)
Lymphs Abs: 1.2 10*3/uL (ref 0.7–4.0)
MCH: 32.4 pg (ref 26.0–34.0)
MCHC: 36.3 g/dL — ABNORMAL HIGH (ref 30.0–36.0)
MCV: 89.3 fL (ref 78.0–100.0)
MONO ABS: 1.1 10*3/uL — AB (ref 0.1–1.0)
MONOS PCT: 13 % — AB (ref 3–12)
NEUTROS ABS: 6.1 10*3/uL (ref 1.7–7.7)
NEUTROS PCT: 72 % (ref 43–77)
Platelets: 373 10*3/uL (ref 150–400)
RBC: 4.57 MIL/uL (ref 3.87–5.11)
RDW: 13.4 % (ref 11.5–15.5)
WBC: 8.6 10*3/uL (ref 4.0–10.5)

## 2014-10-11 LAB — TROPONIN I
TROPONIN I: 0.09 ng/mL — AB (ref <0.031)
TROPONIN I: 0.3 ng/mL — AB (ref <0.031)
Troponin I: 0.25 ng/mL — ABNORMAL HIGH (ref <0.031)

## 2014-10-11 LAB — BRAIN NATRIURETIC PEPTIDE: B Natriuretic Peptide: 213 pg/mL — ABNORMAL HIGH (ref 0.0–100.0)

## 2014-10-11 LAB — MRSA PCR SCREENING: MRSA BY PCR: NEGATIVE

## 2014-10-11 LAB — PROTIME-INR
INR: 1.2 (ref 0.00–1.49)
Prothrombin Time: 15.4 seconds — ABNORMAL HIGH (ref 11.6–15.2)

## 2014-10-11 LAB — TSH: TSH: 0.092 u[IU]/mL — ABNORMAL LOW (ref 0.350–4.500)

## 2014-10-11 MED ORDER — SODIUM CHLORIDE 0.9 % IJ SOLN
3.0000 mL | Freq: Two times a day (BID) | INTRAMUSCULAR | Status: DC
  Administered 2014-10-12 – 2014-10-14 (×3): 3 mL via INTRAVENOUS

## 2014-10-11 MED ORDER — SODIUM CHLORIDE 0.9 % IV BOLUS (SEPSIS)
500.0000 mL | Freq: Once | INTRAVENOUS | Status: AC
  Administered 2014-10-11: 500 mL via INTRAVENOUS

## 2014-10-11 MED ORDER — ALUM & MAG HYDROXIDE-SIMETH 200-200-20 MG/5ML PO SUSP: 30.0000 mL | Freq: Four times a day (QID) | ORAL | Status: DC | PRN

## 2014-10-11 MED ORDER — DOCUSATE SODIUM 100 MG PO CAPS
100.0000 mg | ORAL_CAPSULE | Freq: Two times a day (BID) | ORAL | Status: DC
  Administered 2014-10-11 – 2014-10-14 (×5): 100 mg via ORAL
  Filled 2014-10-11 (×7): qty 1

## 2014-10-11 MED ORDER — ACETAMINOPHEN 650 MG RE SUPP: 650.0000 mg | Freq: Four times a day (QID) | RECTAL | Status: DC | PRN

## 2014-10-11 MED ORDER — ONDANSETRON HCL 4 MG PO TABS: 4.0000 mg | ORAL_TABLET | Freq: Four times a day (QID) | ORAL | Status: DC | PRN

## 2014-10-11 MED ORDER — ARIPIPRAZOLE 5 MG PO TABS
2.5000 mg | ORAL_TABLET | Freq: Every day | ORAL | Status: DC
Start: 2014-10-12 — End: 2014-10-14
  Administered 2014-10-12 – 2014-10-14 (×3): 2.5 mg via ORAL
  Filled 2014-10-11 (×3): qty 1

## 2014-10-11 MED ORDER — POTASSIUM CHLORIDE CRYS ER 20 MEQ PO TBCR: 40.0000 meq | EXTENDED_RELEASE_TABLET | Freq: Once | ORAL | Status: DC

## 2014-10-11 MED ORDER — POTASSIUM CHLORIDE 10 MEQ/100ML IV SOLN
10.0000 meq | Freq: Once | INTRAVENOUS | Status: AC
  Administered 2014-10-11: 10 meq via INTRAVENOUS
  Filled 2014-10-11: qty 100

## 2014-10-11 MED ORDER — POTASSIUM CHLORIDE 20 MEQ/15ML (10%) PO SOLN
40.0000 meq | Freq: Every day | ORAL | Status: DC
  Administered 2014-10-11: 40 meq via ORAL
  Filled 2014-10-11: qty 30

## 2014-10-11 MED ORDER — TRAMADOL HCL 50 MG PO TABS
100.0000 mg | ORAL_TABLET | Freq: Four times a day (QID) | ORAL | Status: DC | PRN
  Administered 2014-10-12: 100 mg via ORAL
  Filled 2014-10-11: qty 2

## 2014-10-11 MED ORDER — SODIUM CHLORIDE 0.9 % IV SOLN
INTRAVENOUS | Status: AC
  Administered 2014-10-11: 14:00:00 via INTRAVENOUS

## 2014-10-11 MED ORDER — DILTIAZEM HCL 25 MG/5ML IV SOLN
10.0000 mg | Freq: Once | INTRAVENOUS | Status: AC
  Administered 2014-10-11: 10 mg via INTRAVENOUS
  Filled 2014-10-11: qty 5

## 2014-10-11 MED ORDER — RIVAROXABAN 15 MG PO TABS
15.0000 mg | ORAL_TABLET | Freq: Every day | ORAL | Status: DC
  Administered 2014-10-11 – 2014-10-13 (×3): 15 mg via ORAL
  Filled 2014-10-11 (×3): qty 1

## 2014-10-11 MED ORDER — ONDANSETRON HCL 4 MG/2ML IJ SOLN: 4.0000 mg | Freq: Four times a day (QID) | INTRAMUSCULAR | Status: DC | PRN

## 2014-10-11 MED ORDER — CYCLOBENZAPRINE HCL 10 MG PO TABS: 5.0000 mg | ORAL_TABLET | Freq: Three times a day (TID) | ORAL | Status: DC | PRN

## 2014-10-11 MED ORDER — ALBUTEROL SULFATE (2.5 MG/3ML) 0.083% IN NEBU: 2.5000 mg | INHALATION_SOLUTION | Freq: Four times a day (QID) | RESPIRATORY_TRACT | Status: DC | PRN

## 2014-10-11 MED ORDER — LEVOTHYROXINE SODIUM 25 MCG PO TABS
125.0000 ug | ORAL_TABLET | Freq: Every day | ORAL | Status: DC
  Administered 2014-10-12 – 2014-10-14 (×3): 125 ug via ORAL
  Filled 2014-10-11 (×6): qty 1

## 2014-10-11 MED ORDER — BUPROPION HCL ER (XL) 150 MG PO TB24
300.0000 mg | ORAL_TABLET | Freq: Every day | ORAL | Status: DC
  Administered 2014-10-12 – 2014-10-14 (×3): 300 mg via ORAL
  Filled 2014-10-11 (×3): qty 2

## 2014-10-11 MED ORDER — TOPIRAMATE 25 MG PO TABS
50.0000 mg | ORAL_TABLET | Freq: Every day | ORAL | Status: DC
  Administered 2014-10-11 – 2014-10-13 (×3): 50 mg via ORAL
  Filled 2014-10-11 (×5): qty 2

## 2014-10-11 MED ORDER — ACETAMINOPHEN 325 MG PO TABS
650.0000 mg | ORAL_TABLET | Freq: Four times a day (QID) | ORAL | Status: DC | PRN
  Administered 2014-10-11: 650 mg via ORAL
  Filled 2014-10-11: qty 2

## 2014-10-11 MED ORDER — TOPIRAMATE 25 MG PO TABS: 50.0000 mg | ORAL_TABLET | Freq: Every day | ORAL | Status: DC

## 2014-10-11 MED ORDER — TOPIRAMATE 25 MG PO TABS
25.0000 mg | ORAL_TABLET | Freq: Every day | ORAL | Status: DC
  Administered 2014-10-12 – 2014-10-14 (×3): 25 mg via ORAL
  Filled 2014-10-11 (×3): qty 1

## 2014-10-11 MED ORDER — POTASSIUM CHLORIDE 10 MEQ/100ML IV SOLN: 10.0000 meq | Freq: Once | INTRAVENOUS | Status: DC

## 2014-10-11 MED ORDER — POTASSIUM CHLORIDE 10 MEQ/100ML IV SOLN
10.0000 meq | INTRAVENOUS | Status: AC
  Administered 2014-10-11 (×3): 10 meq via INTRAVENOUS
  Filled 2014-10-11 (×2): qty 100

## 2014-10-11 NOTE — H&P (Signed)
[facility] History and Physical                                                                                    [name], is a 59 y.o. female  MRN: [id number]   DOB - 1956-03-16  Admit Date - 10/11/2014  Outpatient Primary MD for the patient is [name], MD  With History of -  Past Medical History  Diagnosis Date  . CAD (coronary artery disease)     a.  cath 09/2010: LAD stent patent, S-Int/dCFX ok, S-PDA ok, L-LAD atretic;  b. Lexiscan Myoview (02/2014):  no ischemia, EF 55%   . Hypertension   . Hx of CABG   . Hx of transesophageal echocardiography (TEE) for monitoring 11/2010    TEE 11/2010: EF 60-65%, BAE, trivial atrial septal shunt;  right heart cath in 10/2010 with elevated R and L heart pressures and diuretic started  . HLD (hyperlipidemia)   . Hypothyroidism   . COPD (chronic obstructive pulmonary disease)   . Pulmonary nodules     repeat CT due in 11/2011  . Acute right MCA stroke 11/07/10  . 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 Eczema   . Depression   . Atrial fibrillation     a. s/p TEE-DCCV 02/2104; b. Xarelto started  . Cardiomyopathy with EF 40% at TEE 02/17/14 (likely tachycardia mediated - Myoview 02/19/14 neg for ischemia with normal EF) 02/18/2014  . 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 Sleep apnea     recent sleep study  in 04/2014 per chart review  shows no significant OSA      Past Surgical History  Procedure Laterality Date  . Coronary artery bypass graft    . Debriment for infection in chest    . Chest wall reconstruction    . Bilateral knee surgery    . Bladder surgery    . Left mastoidectomy    . Hernia repair    . Cath 2012    . Tee without cardioversion N/A 02/17/2014    Procedure: TRANSESOPHAGEAL ECHOCARDIOGRAM (TEE);  Surgeon: Pricilla Riffle, MD;  Location: [facility] ENDOSCOPY;  Service: Cardiovascular;  Laterality: N/A;  . Cardioversion N/A 02/17/2014    Procedure: CARDIOVERSION;   Surgeon: Pricilla Riffle, MD;  Location: [facility] ENDOSCOPY;  Service: Cardiovascular;  Laterality: N/A;    in for   Chief Complaint  Patient presents with  . Shortness of Breath  . Chest Pain     HPI   [name]  is a 59 y.o. female, with a past medical history of coronary artery disease status post CABG, CVA, grade 2 diastolic dysfunction, A. fib on Xarelto, and asthma/COPD who presents to the emergency department today for chest pain and shortness of breath. She reports that she has been fighting a cold for a couple of weeks and has been becoming progressively short of breath for the past several days. She denies any fever, vomiting, diarrhea or dysuria. In the ER she was found to be in rapid A. fib with RVR. She appears in mild  respiratory distress with increased work of breathing.   Her troponin is slightly elevated, her potassium is 2.4, and her creatinine is significantly elevated above baseline at 2.21 (baseline is 1.2).  Review of Systems   In addition to the HPI above,  No Fever-chills, No Headache, No changes with Vision or hearing, No problems swallowing food or Liquids, No Abdominal pain, No Nausea or Vomiting, Bowel movements are regular, No Blood in stool or Urine, No dysuria, No new skin rashes or bruises, No new joints pains-aches,  No new weakness, tingling, numbness in any extremity, No recent weight gain or loss, No polyuria, polydypsia or polyphagia, A full 10 point Review of Systems was done, except as stated above, all other Review of Systems were negative.  Social History History  Substance Use Topics  . Smoking status: Former Smoker -- 0.25 packs/day for 30 years    Types: Cigarettes    Quit date: 05/09/2014  . Smokeless tobacco: Never Used  . Alcohol Use: No    Family History Family History  Problem Relation Age of Onset  . COPD Mother   . Heart disease Mother   . Arthritis Mother     Rheumatoid and PMR  . Osteoporosis Mother     Mom fractured hip   . Diabetes type II Mother   . Depression Mother   . Heart attack Father   . Depression Father   . Hypertension Father   . Alcohol abuse Father   . Depression Sister   . Anxiety disorder Sister   . Drug abuse Sister     Prior to Admission medications   Medication Sig Start Date End Date Taking? Authorizing Provider  ARIPiprazole (ABILIFY) 5 MG tablet 1 qam Patient taking differently: Take 2.5 mg by mouth daily.  09/28/14  Yes Archer Asa, MD  buPROPion (WELLBUTRIN XL) 150 MG 24 hr tablet 1 qam  For 5 days then 2  qam 09/28/14  Yes Archer Asa, MD  cyclobenzaprine (FLEXERIL) 5 MG tablet Take 1 tablet (5 mg total) by mouth 3 (three) times daily as needed for muscle spasms. 06/16/14  Yes Carmelina Dane, MD  furosemide (LASIX) 40 MG tablet Take 0.5 tablets (20 mg total) by mouth daily. Patient taking differently: Take 40 mg by mouth daily.  03/06/14  Yes Esperanza Sheets, MD  levothyroxine (SYNTHROID, LEVOTHROID) 125 MCG tablet Take 1 tablet (125 mcg total) by mouth daily. 11/16/13  Yes Elvina Sidle, MD  potassium chloride SA (K-DUR,KLOR-CON) 20 MEQ tablet Take 4 tablets (80 mEq total) by mouth daily. 08/30/14  Yes Madelin Headings, MD  rivaroxaban (XARELTO) 20 MG TABS tablet Take 1 tablet (20 mg total) by mouth daily with supper. 02/20/14  Yes Brittainy Sherlynn Carbon, PA-C  topiramate (TOPAMAX) 50 MG tablet  in am and  at night 05/09/14  Yes Delia Heady, MD  traMADol (ULTRAM) 50 MG tablet TAKE 2 TABLETS BY MOUTH EVERY 6 HOURS AS NEEDED FOR MODERATE PAIN Patient taking differently: Take 100 mg by mouth every 6 (six) hours as needed for moderate pain.  07/18/14  Yes Delia Heady, MD  albuterol (PROVENTIL HFA;VENTOLIN HFA) 108 (90 BASE) MCG/ACT inhaler Inhale 2 puffs into the lungs every 6 (six) hours as needed for wheezing or shortness of breath. 03/06/14   Esperanza Sheets, MD  atorvastatin (LIPITOR) 80 MG tablet Take 0.5 tablets (40 mg total) by mouth daily. Patient not taking:  Reported on 10/11/2014 02/20/14   Brittainy Sherlynn Carbon, PA-C  magnesium oxide (MAG-OX) 400  MG tablet Take 1 tablet (400 mg total) by mouth 2 (two) times daily. Patient not taking: Reported on 10/11/2014 08/30/14   Madelin Headings, MD    Allergies  Allergen Reactions  . Amoxicillin Hives  . Avelox [Moxifloxacin Hcl In Nacl] Other (See Comments)    Mental breakdown.  [name] Hydrocodone Itching  . Penicillins Hives  . Sulfa Antibiotics Other (See Comments)    unknown    Physical Exam  Vitals  Blood pressure 104/79, pulse 78, temperature 97.6 F (36.4 C), temperature source Oral, resp. rate 16, height 5\' 4"  (1.626 m), weight 67.314 kg (148 lb 6.4 oz), SpO2 100 %.  General:  Young, but Chronically ill appearing female, lying in bed with mildly increased work of breathing.  Psych:  Normal affect and insight, Not Suicidal or Homicidal, Awake Alert, Oriented X 3.  ENT:  Ears and Eyes appear Normal, Conjunctivae clear, PER. Moist oral mucosa without erythema or exudates.  Neck:  Supple, No lymphadenopathy appreciated  Respiratory:  Symmetrical chest wall movement, Good air movement bilaterally, CTAB.  Cardiac: Chest has central scar and sterum appears elevated. RRR, No Murmurs, no LE edema noted, no JVD.    Abdomen:  Positive bowel sounds, Soft, Non tender, Non distended,  No masses appreciated  Skin:  No Cyanosis, Normal Skin Turgor, No Skin Rash or Bruise.  Extremities:  Able to move all 4. 5/5 strength in each,  no effusions.    Data Review  CBC  Recent Labs Lab 10/11/14 0954  WBC 8.6  HGB 14.8  HCT 40.8  PLT 373  MCV 89.3  MCH 32.4  MCHC 36.3*  RDW 13.4  LYMPHSABS 1.2  MONOABS 1.1*  EOSABS 0.1  BASOSABS 0.0    Chemistries   Recent Labs Lab 10/11/14 0954  NA 138  K 2.4*  CL 98  CO2 30  GLUCOSE 118*  BUN 29*  CREATININE 2.21*  CALCIUM 9.3  AST 18  ALT 9  ALKPHOS 85  BILITOT 0.6    Coagulation profile  Recent Labs Lab 10/11/14 0954  INR 1.20      Cardiac Enzymes  Recent Labs Lab 10/11/14 0954  TROPONINI 0.09*    Imaging results:   Dg Chest Port 1 View  10/11/2014   CLINICAL DATA:  Chest pain and difficulty breathing  EXAM: PORTABLE CHEST - 1 VIEW  COMPARISON:  March 03, 2014  FINDINGS: There is no edema or consolidation. The heart size and pulmonary vascularity are normal. No adenopathy. Patient is status post coronary artery bypass grafting.  IMPRESSION: No edema or consolidation.   Electronically Signed   By: Bretta Bang M.D.   On: 10/11/2014 11:01    My personal review of EKG: initial EKG showed rapid afib with RVR, follow up EKG showed sinus rhythm   Assessment & Plan  Principal Problem:   Atrial fibrillation with RVR Active Problems:   Chronic diastolic heart failure   Coronary artery disease s/p CABG and prior PCI to LAD   AKI (acute kidney injury)   COPD (chronic obstructive pulmonary disease)   Mood disorder   Excessive daytime sleepiness   Hypokalemia   Renal insufficiency   A-fib   Afib with RVR Has been on Xeralto.  Will ask for pharmacy consultation for anticoagulation due to the patient's Acute renal failure. Check TSH, Cycle enzymes Cardiology consulted.  She received cardizem injection in the ER and is now back in sinus rhythm  Hypoxic respiratory failure with increased work of breathing. Likely secondary  to afib with RVR.  Worsened by recent cold & congestion. Continuous pulse ox, PRN oxygen. Nebulizers PRN.  Chronic diastolic heart failure  Grade 2 diastolic dysfunction on 9/15 echo.  Patient is euvolemic on exam.  Holding lasix due to acute renal failure.  Acute renal failure Likely secondary to dehydration and medications. Baseline 1.2.  According to outpatient notes the patient's lisinopril was discontinued 1/11 due to rising creatinine. Will hold nephrotoxic medications and give gentle IVF.  Hypokalemia (2.4) Patient reports she is unable to swallow potassium pills. Will  utilize potassium solution.  Recommend she be discharged on potassium solution as well. Replete with oral and IV.  CAD s/p CABG and stenting  Patient has a history of staph infection post CABG resulting in deformity to her chest.  HLD Patient stopped taking lipitor because she was unable to swallow the pill.  Hypothyroid Patient on synthroid.    Dysphagia to pills. Patient denies problems with swallowing food or liquids.  Only medications.    DVT Prophylaxis: Xeralto per pharmacy  AM Labs Ordered, also please review Full Orders  Family Communication:   None at bedside  Code Status:  DO NOT RESUSCITATE  Condition:  Guarded  Time spent in minutes : 60   York, Xiao Graul L PA-C on 10/11/2014 at 2:57 PM  Between 7am to 7pm - Pager - [phone]  After 7pm go to [email or web address] - password [email or web address]  And look for the night coverage person covering me after hours  [facility]

## 2014-10-11 NOTE — Progress Notes (Signed)
ANTICOAGULATION CONSULT NOTE - Initial Consult  Pharmacy Consult for Xarelto Indication: Nonvalvular Atrial Fibrilation  Allergies  Allergen Reactions  . Amoxicillin Hives  . Avelox [Moxifloxacin Hcl In Nacl] Other (See Comments)    Mental breakdown.  [name] Hydrocodone Itching  . Penicillins Hives  . Sulfa Antibiotics Other (See Comments)    unknown    Patient Measurements: Height:  (162.6 cm) Weight: 148 lb 6.4 oz (67.314 kg) IBW/kg (Calculated) : 54.7   Vital Signs: Temp: 97.6 F (36.4 C) (02/02 1417) Temp Source: Oral (02/02 1417) BP: 104/79 mmHg (02/02 1417) Pulse Rate: 78 (02/02 1417)  Labs:  Recent Labs  10/11/14 0954  HGB 14.8  HCT 40.8  PLT 373  LABPROT 15.4*  INR 1.20  CREATININE 2.21*  TROPONINI 0.09*    Estimated Creatinine Clearance: 26.5 mL/min (by C-G formula based on Cr of 2.21).   Medical History: Past Medical History  Diagnosis Date  . CAD (coronary artery disease)     a.  cath 09/2010: LAD stent patent, S-Int/dCFX ok, S-PDA ok, L-LAD atretic;  b. Lexiscan Myoview (02/2014):  no ischemia, EF 55%   . Hypertension   . Hx of CABG   . Hx of transesophageal echocardiography (TEE) for monitoring 11/2010    TEE 11/2010: EF 60-65%, BAE, trivial atrial septal shunt;  right heart cath in 10/2010 with elevated R and L heart pressures and diuretic started  . HLD (hyperlipidemia)   . Hypothyroidism   . COPD (chronic obstructive pulmonary disease)   . Pulmonary nodules     repeat CT due in 11/2011  . Acute right MCA stroke 11/07/10  . 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 Eczema   . Depression   . Atrial fibrillation     a. s/p TEE-DCCV 02/2104; b. Xarelto started  . Cardiomyopathy with EF 40% at TEE 02/17/14 (likely tachycardia mediated - Myoview 02/19/14 neg for ischemia with normal EF) 02/18/2014  . 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 Sleep apnea     recent sleep study  in 04/2014 per chart review   shows no significant OSA    Medications:  Prescriptions prior to admission  Medication Sig Dispense Refill Last Dose  . ARIPiprazole (ABILIFY) 5 MG tablet 1 qam (Patient taking differently: Take 2.5 mg by mouth daily. ) 30 tablet 5 10/11/2014 at Unknown time  . buPROPion (WELLBUTRIN XL) 150 MG 24 hr tablet 1 qam  For 5 days then 2  qam 60 tablet 5 10/11/2014 at Unknown time  . cyclobenzaprine (FLEXERIL) 5 MG tablet Take 1 tablet (5 mg total) by mouth 3 (three) times daily as needed for muscle spasms. 30 tablet 1 Past Week at Unknown time  . furosemide (LASIX) 40 MG tablet Take 0.5 tablets (20 mg total) by mouth daily. (Patient taking differently: Take 40 mg by mouth daily. ) 60 tablet 5 10/11/2014 at Unknown time  . levothyroxine (SYNTHROID, LEVOTHROID) 125 MCG tablet Take 1 tablet (125 mcg total) by mouth daily. 30 tablet 11 10/11/2014 at Unknown time  . potassium chloride SA (K-DUR,KLOR-CON) 20 MEQ tablet Take 4 tablets (80 mEq total) by mouth daily. 120 tablet 1 Past Week at Unknown time  . rivaroxaban (XARELTO) 20 MG TABS tablet Take 1 tablet (20 mg total) by mouth daily with supper. 30 tablet 10 10/10/2014 at Unknown time  . topiramate (TOPAMAX) 50 MG tablet  in am and  100mg  at night 100 tablet 3 10/11/2014 at Unknown time  . traMADol (ULTRAM) 50 MG tablet TAKE 2 TABLETS BY MOUTH EVERY 6 HOURS AS NEEDED FOR MODERATE PAIN (Patient taking differently: Take 100 mg by mouth every 6 (six) hours as needed for moderate pain. ) 30 tablet 5 Past Week at Unknown time  . albuterol (PROVENTIL HFA;VENTOLIN HFA) 108 (90 BASE) MCG/ACT inhaler Inhale 2 puffs into the lungs every 6 (six) hours as needed for wheezing or shortness of breath. 1 Inhaler 2 rescue  . atorvastatin (LIPITOR) 80 MG tablet Take 0.5 tablets (40 mg total) by mouth daily. (Patient not taking: Reported on 10/11/2014) 30 tablet 11 Taking  . magnesium oxide (MAG-OX) 400 MG tablet Take 1 tablet (400 mg total) by mouth 2 (two) times daily. (Patient not  taking: Reported on 10/11/2014) 60 tablet 1    Scheduled:  . [START ON 10/12/2014] ARIPiprazole  2.5 mg Oral Daily  . [START ON 10/12/2014] buPROPion  300 mg Oral Daily  . docusate sodium  100 mg Oral BID  . [START ON 10/12/2014] levothyroxine  125 mcg Oral QAC breakfast  . potassium chloride  10 mEq Intravenous Q1 Hr x 3  . potassium chloride  40 mEq Oral Daily  . sodium chloride  3 mL Intravenous Q12H  . [START ON 10/12/2014] topiramate  25 mg Oral Daily   And  . topiramate  50 mg Oral QHS    Assessment: 59 y.o presented with SOB and  chronic diffuse chest pain. Admitted 10/11/14 for Afib with RVR.  Patient converted to sinus rhythm while in the ED. She was on xarelto prior to admission for PAF. Dr. Eden Emms notes that patient has been compliant with her xarelto. She was on Xarelto 20mg  daily with supper. Last taken on 10/10/14. Her Scr is 2.21 today; increased from 1.5 on 09/19/14.  For nonvalvular Afib in patient with CrCl 15-50 mL/min, the  Maximum dose of xarelto is 15 mg/day.  CBC is within normal limits.    Goal of Therapy:  Monitor platelets by anticoagulation protocol: Yes   Plan:  Decrease Xarelto to 15 mg daily with supper.  Monitor of bleeding and renal function.   Noah Delaine, RPh Clinical Pharmacist Pager: [phone] 10/11/2014,2:36 PM

## 2014-10-11 NOTE — ED Notes (Signed)
Pt presents to department for evaluation of SOB and chronic diffuse chest pain. Ongoing for several days. Pt reports increased difficulty breathing with exertion. Pt is alert and oriented x4. Respirations unlabored.
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
## 2014-10-11 NOTE — ED Notes (Signed)
Diet tray ordered 

## 2014-10-11 NOTE — Consult Note (Addendum)
CARDIOLOGY CONSULT NOTE       Patient ID: MALCOLM HETZ MRN: [id number] DOB/AGE: [birth year] 59 y.o.  Admit date: 10/11/2014 Referring Physician:  Patria Mane Primary Physician: [name], MD Primary Cardiologist:  Tobias Alexander Reason for Consultation:  PAF chest pain  Principal Problem:   Atrial fibrillation with RVR Active Problems:   Chronic diastolic heart failure   Coronary artery disease s/p CABG and prior PCI to LAD   AKI (acute kidney injury)   COPD (chronic obstructive pulmonary disease)   Mood disorder   Excessive daytime sleepiness   Hypokalemia   Renal insufficiency   HPI:   59 y.o.  female with prior medical history of coronary artery disease, hypertension, hyperlipidemia, stroke who is coming for concern of progressively worsening shortness of breath and orthopnea. The patient has a prior medical history of coronary artery bypass disease in 2008 and she was followed in our clinic by Dr Delton See last seen in November . She afterwards had a stroke became homeless and moved to [place]. I took care of her mother [name] who passed 2 years ago and unfortunatley Jackquelyn is alone now   Admitted with worsening dyspnea and recurrent rapid afib.    The patient was admitted to the hospital and cardioverted on 02/17/2014. The patient was found to have mildly impaired left ventricle systolic function. This is her 3rd episode of PAF this year  Was on amiodarone In past but stopped 11/15.  Echo 05/17/14 only mild LAE and normal EF 60-65%     ROS All other systems reviewed and negative except as noted above  Past Medical History  Diagnosis Date  . CAD (coronary artery disease)     a.  cath 09/2010: LAD stent patent, S-Int/dCFX ok, S-PDA ok, L-LAD atretic;  b. Lexiscan Myoview (02/2014):  no ischemia, EF 55%   . Hypertension   . Hx of CABG   . Hx of transesophageal echocardiography (TEE) for monitoring 11/2010    TEE 11/2010: EF 60-65%, BAE, trivial atrial septal shunt;   right heart cath in 10/2010 with elevated R and L heart pressures and diuretic started  . HLD (hyperlipidemia)   . Hypothyroidism   . COPD (chronic obstructive pulmonary disease)   . Pulmonary nodules     repeat CT due in 11/2011  . Acute right MCA stroke 11/07/10  . 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 Eczema   . Depression   . Atrial fibrillation     a. s/p TEE-DCCV 02/2104; b. Xarelto started  . Cardiomyopathy with EF 40% at TEE 02/17/14 (likely tachycardia mediated - Myoview 02/19/14 neg for ischemia with normal EF) 02/18/2014  . 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 Sleep apnea     recent sleep study  in 04/2014 per chart review  shows no significant OSA    Family History  Problem Relation Age of Onset  . COPD Mother   . Heart disease Mother   . Arthritis Mother     Rheumatoid and PMR  . Osteoporosis Mother     Mom fractured hip  . Diabetes type II Mother   . Depression Mother   . Heart attack Father   . Depression Father   . Hypertension Father   . Alcohol abuse Father   . Depression Sister   . Anxiety disorder Sister   . Drug abuse Sister     History  Social History  . Marital Status: Divorced    Spouse Name: N/A    Number of Children: 2  . Years of Education: N/A   Occupational History  . DISABLE     Social History Main Topics  . Smoking status: Former Smoker -- 0.25 packs/day for 30 years    Types: Cigarettes    Quit date: 05/09/2014  . Smokeless tobacco: Never Used  . Alcohol Use: No  . Drug Use: No  . Sexual Activity: Not Currently   Other Topics Concern  . Not on file   Social History Narrative   Divorced   After stroke was living with sister and mother after sister asked her to leave. Mom has since deceased    Difficulties over control.    Then moved back in for about 6 weeks.   . tranportaion difficult son  helping   Ex-smoker   Was living at friends  Sister and her had argument and she was  told to leave .    She was living in a homeless shelter for the last 3 weeks [facility]    son had help with transportation and medication       Work status regular  before got sick on short-term disability now lost t insurance after the stroke and couldn't work was  denied ssi    2 times.  She is not eligible for Medicaid because she doesn't have dependent children.         College graduate ;psychology [employer] graduated May 2011   Has children   Now on medicare /medicaid disability  So she has some acess to health services    Has a Designer, industrial/product and drives some .   Lives in  rented house 2 house mates males keep tp self has her own b room  Doing well     Past Surgical History  Procedure Laterality Date  . Coronary artery bypass graft    . Debriment for infection in chest    . Chest wall reconstruction    . Bilateral knee surgery    . Bladder surgery    . Left mastoidectomy    . Hernia repair    . Cath 2012    . Tee without cardioversion N/A 02/17/2014    Procedure: TRANSESOPHAGEAL ECHOCARDIOGRAM (TEE);  Surgeon: Pricilla Riffle, MD;  Location: [facility] ENDOSCOPY;  Service: Cardiovascular;  Laterality: N/A;  . Cardioversion N/A 02/17/2014    Procedure: CARDIOVERSION;  Surgeon: Pricilla Riffle, MD;  Location: [facility] ENDOSCOPY;  Service: Cardiovascular;  Laterality: N/A;     . potassium chloride SA  40 mEq Oral Once   . potassium chloride      Physical Exam: Blood pressure 98/59, pulse 62, temperature 98.2 F (36.8 C), temperature source Oral, resp. rate 20, SpO2 100 %.   Affect appropriate Chronically ill female  HEENT: normal Neck supple with no adenopathy JVP normal no bruits no thyromegaly Lungs clear with no wheezing and good diaphragmatic motion Heart:  S1/S2 no murmur, no rub, gallop or click PMI normal Abdomen: benighn, BS positve, no tenderness, no AAA no bruit.  No HSM or HJR Distal pulses intact with no bruits No edema Neuro non-focal  Cocnitive defect and halting speech  from previous stroke Skin warm and dry No muscular weakness   Labs:   Lab Results  Component Value Date   WBC 8.6 10/11/2014   HGB 14.8 10/11/2014   HCT 40.8 10/11/2014   MCV 89.3 10/11/2014   PLT 373  10/11/2014    Recent Labs Lab 10/11/14 0954  NA 138  K 2.4*  CL 98  CO2 30  BUN 29*  CREATININE 2.21*  CALCIUM 9.3  PROT 7.4  BILITOT 0.6  ALKPHOS 85  ALT 9  AST 18  GLUCOSE 118*   Lab Results  Component Value Date   CKTOTAL 34 08/10/2011   CKMB 2.1 08/10/2011   TROPONINI 0.09* 10/11/2014    Lab Results  Component Value Date   CHOL 237* 11/16/2013   CHOL 134 02/11/2011   CHOL  11/08/2010    148        ATP III CLASSIFICATION:  <200     mg/dL   Desirable  161-096  mg/dL   Borderline High  >=045    mg/dL   High          Lab Results  Component Value Date   HDL 68 11/16/2013   HDL 40 02/11/2011   HDL 40 11/08/2010   Lab Results  Component Value Date   LDLCALC 152* 11/16/2013   LDLCALC  02/11/2011    75        Total Cholesterol/HDL:CHD Risk Coronary Heart Disease Risk Table                     Men   Women  1/2 Average Risk   3.4   3.3  Average Risk       5.0   4.4  2 X Average Risk   9.6   7.1  3 X Average Risk  23.4   11.0        Use the calculated Patient Ratio above and the CHD Risk Table to determine the patient's CHD Risk.        ATP III CLASSIFICATION (LDL):  <100     mg/dL   Optimal  409-811  mg/dL   Near or Above                    Optimal  130-159  mg/dL   Borderline  914-782  mg/dL   High  >956     mg/dL   Very High   LDLCALC  11/08/2010    87        Total Cholesterol/HDL:CHD Risk Coronary Heart Disease Risk Table                     Men   Women  1/2 Average Risk   3.4   3.3  Average Risk       5.0   4.4  2 X Average Risk   9.6   7.1  3 X Average Risk  23.4   11.0        Use the calculated Patient Ratio above and the CHD Risk Table to determine the patient's CHD Risk.        ATP III CLASSIFICATION (LDL):  <100     mg/dL    Optimal  213-086  mg/dL   Near or Above                    Optimal  130-159  mg/dL   Borderline  578-469  mg/dL   High  >629     mg/dL   Very High   Lab Results  Component Value Date   TRIG 85 11/16/2013   TRIG 94 02/11/2011   TRIG 103 11/08/2010   Lab Results  Component Value Date   CHOLHDL 3.5 11/16/2013  CHOLHDL 3.4 02/11/2011   CHOLHDL 3.7 11/08/2010   Lab Results  Component Value Date   LDLDIRECT 153.0 01/15/2010       Current facility-administered medications:  .  potassium chloride 10 mEq in 100 mL IVPB, 10 mEq, Intravenous, Once, Marissa Sciacca, PA-C .  potassium chloride SA (K-DUR,KLOR-CON) CR tablet 40 mEq, 40 mEq, Oral, Once, Raymon Mutton, PA-C  Current outpatient prescriptions:  .  ARIPiprazole (ABILIFY) 5 MG tablet, 1 qam (Patient taking differently: Take 2.5 mg by mouth daily. ), Disp: 30 tablet, Rfl: 5 .  buPROPion (WELLBUTRIN XL) 150 MG 24 hr tablet, 1 qam  For 5 days then 2  qam, Disp: 60 tablet, Rfl: 5 .  cyclobenzaprine (FLEXERIL) 5 MG tablet, Take 1 tablet (5 mg total) by mouth 3 (three) times daily as needed for muscle spasms., Disp: 30 tablet, Rfl: 1 .  furosemide (LASIX) 40 MG tablet, Take 0.5 tablets (20 mg total) by mouth daily. (Patient taking differently: Take 40 mg by mouth daily. ), Disp: 60 tablet, Rfl: 5 .  levothyroxine (SYNTHROID, LEVOTHROID) 125 MCG tablet, Take 1 tablet (125 mcg total) by mouth daily., Disp: 30 tablet, Rfl: 11 .  potassium chloride SA (K-DUR,KLOR-CON) 20 MEQ tablet, Take 4 tablets (80 mEq total) by mouth daily., Disp: 120 tablet, Rfl: 1 .  rivaroxaban (XARELTO) 20 MG TABS tablet, Take 1 tablet (20 mg total) by mouth daily with supper., Disp: 30 tablet, Rfl: 10 .  topiramate (TOPAMAX) 50 MG tablet, 50mg  in am and 100mg  at night, Disp: 100 tablet, Rfl: 3 .  traMADol (ULTRAM) 50 MG tablet, TAKE 2 TABLETS BY MOUTH EVERY 6 HOURS AS NEEDED FOR MODERATE PAIN (Patient taking differently: Take 100 mg by mouth every 6 (six) hours as  needed for moderate pain. ), Disp: 30 tablet, Rfl: 5 .  albuterol (PROVENTIL HFA;VENTOLIN HFA) 108 (90 BASE) MCG/ACT inhaler, Inhale 2 puffs into the lungs every 6 (six) hours as needed for wheezing or shortness of breath., Disp: 1 Inhaler, Rfl: 2 .  atorvastatin (LIPITOR) 80 MG tablet, Take 0.5 tablets (40 mg total) by mouth daily. (Patient not taking: Reported on 10/11/2014), Disp: 30 tablet, Rfl: 11 .  magnesium oxide (MAG-OX) 400 MG tablet, Take 1 tablet (400 mg total) by mouth 2 (two) times daily. (Patient not taking: Reported on 10/11/2014), Disp: 60 tablet, Rfl: 1 Radiology: Dg Chest Port 1 View  10/11/2014   CLINICAL DATA:  Chest pain and difficulty breathing  EXAM: PORTABLE CHEST - 1 VIEW  COMPARISON:  March 03, 2014  FINDINGS: There is no edema or consolidation. The heart size and pulmonary vascularity are normal. No adenopathy. Patient is status post coronary artery bypass grafting.  IMPRESSION: No edema or consolidation.   Electronically Signed   By: Bretta Bang M.D.   On: 10/11/2014 11:01    EKG:  Rapid afib nonspecific ST chagnes   ASSESSMENT AND PLAN:  PAF:  Resume amiodarone 200 bid  Outpatient f/u TSH and LFTs  Since she is on synthroid replacement.  Continue xarelto she has been compliant   This patients CHA2DS2-VASc Score and unadjusted Ischemic Stroke Rate (% per year) is equal to 7.2 % stroke rate/year from a score of 5  Above score calculated as 1 point each if present [CHF, HTN, DM, Vascular=MI/PAD/Aortic Plaque, Age if 65-74, or Female] Above score calculated as 2 points each if present [Age > 75, or Stroke/TIA/TE]   CAD:  Stable r/o no acute changes no need for further evaluation Cr:  Hydrate elevated Cr ? 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  On abilify  May need social service consult   Signed: Charlton Haws 10/11/2014, 12:44 PM

## 2014-10-12 DIAGNOSIS — I25810 Atherosclerosis of coronary artery bypass graft(s) without angina pectoris: Secondary | ICD-10-CM

## 2014-10-12 DIAGNOSIS — N289 Disorder of kidney and ureter, unspecified: Secondary | ICD-10-CM

## 2014-10-12 LAB — CBC
HCT: 30.3 % — ABNORMAL LOW (ref 36.0–46.0)
Hemoglobin: 10.9 g/dL — ABNORMAL LOW (ref 12.0–15.0)
MCH: 32.1 pg (ref 26.0–34.0)
MCHC: 36 g/dL (ref 30.0–36.0)
MCV: 89.1 fL (ref 78.0–100.0)
Platelets: 207 10*3/uL (ref 150–400)
RBC: 3.4 MIL/uL — AB (ref 3.87–5.11)
RDW: 13.4 % (ref 11.5–15.5)
WBC: 4.8 10*3/uL (ref 4.0–10.5)

## 2014-10-12 LAB — BASIC METABOLIC PANEL
Anion gap: 8 (ref 5–15)
Anion gap: 6 (ref 5–15)
BUN: 24 mg/dL — ABNORMAL HIGH (ref 6–23)
BUN: 19 mg/dL (ref 6–23)
CALCIUM: 7.7 mg/dL — AB (ref 8.4–10.5)
CO2: 24 mmol/L (ref 19–32)
CO2: 27 mmol/L (ref 19–32)
CREATININE: 1.54 mg/dL — AB (ref 0.50–1.10)
Calcium: 8.1 mg/dL — ABNORMAL LOW (ref 8.4–10.5)
Chloride: 106 mmol/L (ref 96–112)
Chloride: 107 mmol/L (ref 96–112)
Creatinine, Ser: 1.66 mg/dL — ABNORMAL HIGH (ref 0.50–1.10)
GFR calc Af Amer: 39 mL/min — ABNORMAL LOW (ref >90)
GFR calc Af Amer: 42 mL/min — ABNORMAL LOW (ref >90)
GFR, EST NON AFRICAN AMERICAN: 33 mL/min — AB (ref >90)
GFR, EST NON AFRICAN AMERICAN: 36 mL/min — AB (ref >90)
Glucose, Bld: 107 mg/dL — ABNORMAL HIGH (ref 70–99)
Glucose, Bld: 129 mg/dL — ABNORMAL HIGH (ref 70–99)
POTASSIUM: 2.4 mmol/L — AB (ref 3.5–5.1)
Potassium: 3 mmol/L — ABNORMAL LOW (ref 3.5–5.1)
SODIUM: 138 mmol/L (ref 135–145)
Sodium: 140 mmol/L (ref 135–145)

## 2014-10-12 LAB — URINALYSIS, ROUTINE W REFLEX MICROSCOPIC
Bilirubin Urine: NEGATIVE
Glucose, UA: NEGATIVE mg/dL
Hgb urine dipstick: NEGATIVE
Ketones, ur: NEGATIVE mg/dL
Leukocytes, UA: NEGATIVE
Nitrite: NEGATIVE
Protein, ur: NEGATIVE mg/dL
Specific Gravity, Urine: 1.007 (ref 1.005–1.030)
Urobilinogen, UA: 0.2 mg/dL (ref 0.0–1.0)
pH: 5.5 (ref 5.0–8.0)

## 2014-10-12 LAB — TROPONIN I: Troponin I: 0.22 ng/mL — ABNORMAL HIGH (ref <0.031)

## 2014-10-12 LAB — MAGNESIUM: MAGNESIUM: 1.2 mg/dL — AB (ref 1.5–2.5)

## 2014-10-12 LAB — T4, FREE: Free T4: 1.76 ng/dL (ref 0.80–1.80)

## 2014-10-12 MED ORDER — POTASSIUM CHLORIDE 20 MEQ/15ML (10%) PO SOLN
40.0000 meq | Freq: Once | ORAL | Status: AC
  Administered 2014-10-12: 40 meq via ORAL
  Filled 2014-10-12: qty 30

## 2014-10-12 MED ORDER — SODIUM CHLORIDE 0.9 % IV SOLN
INTRAVENOUS | Status: DC
  Administered 2014-10-12: 12:00:00 via INTRAVENOUS

## 2014-10-12 MED ORDER — TAMSULOSIN HCL 0.4 MG PO CAPS
0.4000 mg | ORAL_CAPSULE | Freq: Every day | ORAL | Status: DC
  Administered 2014-10-12 – 2014-10-14 (×3): 0.4 mg via ORAL
  Filled 2014-10-12 (×3): qty 1

## 2014-10-12 MED ORDER — AMIODARONE HCL 200 MG PO TABS
400.0000 mg | ORAL_TABLET | Freq: Every day | ORAL | Status: DC
  Administered 2014-10-12 – 2014-10-14 (×3): 400 mg via ORAL
  Filled 2014-10-12 (×3): qty 2

## 2014-10-12 MED ORDER — POTASSIUM CHLORIDE CRYS ER 10 MEQ PO TBCR
40.0000 meq | EXTENDED_RELEASE_TABLET | Freq: Every day | ORAL | Status: DC
Start: 2014-10-12 — End: 2014-10-12
  Administered 2014-10-12: 40 meq via ORAL
  Filled 2014-10-12: qty 4

## 2014-10-12 MED ORDER — ENSURE COMPLETE PO LIQD
237.0000 mL | Freq: Three times a day (TID) | ORAL | Status: DC
  Administered 2014-10-13: 237 mL via ORAL

## 2014-10-12 MED ORDER — POTASSIUM CHLORIDE CRYS ER 10 MEQ PO TBCR
40.0000 meq | EXTENDED_RELEASE_TABLET | Freq: Every day | ORAL | Status: DC
  Administered 2014-10-13: 40 meq via ORAL
  Filled 2014-10-12: qty 4

## 2014-10-12 MED ORDER — POTASSIUM CHLORIDE 10 MEQ/100ML IV SOLN
10.0000 meq | Freq: Once | INTRAVENOUS | Status: AC
  Administered 2014-10-12: 10 meq via INTRAVENOUS
  Filled 2014-10-12: qty 100

## 2014-10-12 MED ORDER — MAGNESIUM SULFATE 2 GM/50ML IV SOLN
2.0000 g | Freq: Once | INTRAVENOUS | Status: AC
  Administered 2014-10-12: 2 g via INTRAVENOUS
  Filled 2014-10-12: qty 50

## 2014-10-12 MED ORDER — POTASSIUM CHLORIDE 10 MEQ/100ML IV SOLN
10.0000 meq | INTRAVENOUS | Status: AC
  Administered 2014-10-12 (×3): 10 meq via INTRAVENOUS
  Filled 2014-10-12 (×4): qty 100

## 2014-10-12 MED ORDER — OFF THE BEAT BOOK
Freq: Once | Status: AC
  Administered 2014-10-12: 17:00:00
  Filled 2014-10-12: qty 1

## 2014-10-12 NOTE — Progress Notes (Signed)
UR Completed Netha Dafoe Graves-Bigelow, RN,BSN [phone]  

## 2014-10-12 NOTE — Progress Notes (Signed)
CRITICAL VALUE ALERT  Critical value received:  K+ 2.4  Date of notification:  10/12/14  Time of notification:  0400  Critical value read back:Yes.    Nurse who received alert:  Beryle Quant, RN  MD notified (1st page):  Kirtland Bouchard Schorr - TR[facility]  Time of first page:  0405  MD notified (2nd page): Time of second page:  Responding MD:  Merdis Delay - TR[facility]  Time MD responded:  [phone]  Replacement orders received.

## 2014-10-12 NOTE — Progress Notes (Signed)
[facility] PROGRESS NOTE  [name] ZOX:[id number] DOB: [birth year] DOA: 10/11/2014 PCP: [name], MD  Assessment/Plan: 1-Acute renal failure;  Pre renal. Improved with IV fluids.  Has history of urine retention. Will start Flomax. If continue to have retention might need foley placement. Cr peak to 2.2 , it has decrease to 1.5.  2-Severe Hypokalemia; repletion IV and oral KCL. Check mg level. Replete magnesium.   3-A fib; started on amiodarone.   4-Hypothyroidism;  TSH low. Will order Free T 3 and T 4.  Adjust synthroid depending on Free T 3 and T 4 results.   5-Hypomagnesemia; replete IV.   6-Acute hypoxic respiratory Failure;  Related to A fib RVR.  Improved. Nebulizer PRN.   Chronic diastolic heart failure  Grade 2 diastolic dysfunction on 9/15 echo. Patient is euvolemic on exam. Holding lasix due to acute renal failure. Carefull with IV fluids.   CAD s/p CABG and stenting  Patient has a history of staph infection post CABG resulting in deformity to her chest.  Mild elevated troponin; in setting A fib RVR.   Code Status: DNR Family Communication: care discussed with patient.  Disposition Plan: correction of electrolytes, awaiting improvement of renal function. PT consulted. SW, CM.    Consultants:  Cardiology  Procedures:  none  Antibiotics:  none  HPI/Subjective: Feeling better, no chest pain other than her chronic soreness on the chest.  She is breathing better.    Objective: Filed Vitals:   10/12/14 0552  BP: 107/72  Pulse: 67  Temp: 97.2 F (36.2 C)  Resp: 18    Intake/Output Summary (Last 24 hours) at 10/12/14 0752 Last data filed at 10/12/14 0555  Gross per 24 hour  Intake 2456.25 ml  Output    800 ml  Net 1656.25 ml   Filed Weights   10/11/14 1417 10/12/14 0301  Weight: 67.314 kg (148 lb 6.4 oz) 70.171 kg (154 lb 11.2 oz)    Exam:   General:  Alert in no distress.   Cardiovascular: S 1, S 2  RRR  Respiratory: CTA   Abdomen: bs present, soft, nt  Musculoskeletal: no edema.   Data Reviewed: Basic Metabolic Panel:  Recent Labs Lab 10/11/14 0954 10/12/14 0222  NA 138 138  K 2.4* 2.4*  CL 98 106  CO2 30 24  GLUCOSE 118* 107*  BUN 29* 24*  CREATININE 2.21* 1.66*  CALCIUM 9.3 7.7*   Liver Function Tests:  Recent Labs Lab 10/11/14 0954  AST 18  ALT 9  ALKPHOS 85  BILITOT 0.6  PROT 7.4  ALBUMIN 4.4   No results for input(s): LIPASE, AMYLASE in the last 168 hours. No results for input(s): AMMONIA in the last 168 hours. CBC:  Recent Labs Lab 10/11/14 0954 10/12/14 0222  WBC 8.6 4.8  NEUTROABS 6.1  --   HGB 14.8 10.9*  HCT 40.8 30.3*  MCV 89.3 89.1  PLT 373 207   Cardiac Enzymes:  Recent Labs Lab 10/11/14 0954 10/11/14 1544 10/11/14 1952 10/12/14 0222  TROPONINI 0.09* 0.25* 0.30* 0.22*   BNP (last 3 results)  Recent Labs  10/11/14 0954  BNP 213.0*    ProBNP (last 3 results)  Recent Labs  02/18/14 1205 03/03/14 1539  PROBNP 1629.0* 644.7*    CBG: No results for input(s): GLUCAP in the last 168 hours.  Recent Results (from the past 240 hour(s))  MRSA PCR Screening     Status: None   Collection Time: 10/11/14  3:49 PM  Result Value  Ref Range Status   MRSA by PCR NEGATIVE NEGATIVE Final    Comment:        The GeneXpert MRSA Assay (FDA approved for NASAL specimens only), is one component of a comprehensive MRSA colonization surveillance program. It is not intended to diagnose MRSA infection nor to guide or monitor treatment for MRSA infections.      Studies: Dg Chest Port 1 View  10/11/2014   CLINICAL DATA:  Chest pain and difficulty breathing  EXAM: PORTABLE CHEST - 1 VIEW  COMPARISON:  March 03, 2014  FINDINGS: There is no edema or consolidation. The heart size and pulmonary vascularity are normal. No adenopathy. Patient is status post coronary artery bypass grafting.  IMPRESSION: No edema or consolidation.    Electronically Signed   By: Bretta Bang M.D.   On: 10/11/2014 11:01    Scheduled Meds: . ARIPiprazole  2.5 mg Oral Daily  . buPROPion  300 mg Oral Daily  . docusate sodium  100 mg Oral BID  . levothyroxine  125 mcg Oral QAC breakfast  . potassium chloride  10 mEq Intravenous Q1 Hr x 4  . potassium chloride  40 mEq Oral Daily  . rivaroxaban  15 mg Oral Q supper  . sodium chloride  3 mL Intravenous Q12H  . topiramate  25 mg Oral Daily   And  . topiramate  50 mg Oral QHS   Continuous Infusions:   Principal Problem:   Atrial fibrillation with RVR Active Problems:   Chronic diastolic heart failure   Coronary artery disease s/p CABG and prior PCI to LAD   AKI (acute kidney injury)   COPD (chronic obstructive pulmonary disease)   Mood disorder   Excessive daytime sleepiness   Hypokalemia   Renal insufficiency   A-fib    Time spent: 35 minutes.     Hartley Barefoot A  [facility] Pager [phone]. If 7PM-7AM, please contact night-coverage at [email or web address], password [facility] 10/12/2014, 7:52 AM  LOS: 1 day

## 2014-10-12 NOTE — Progress Notes (Signed)
INITIAL NUTRITION ASSESSMENT  DOCUMENTATION CODES Per approved criteria  -Not Applicable   INTERVENTION:  Ensure Complete PO TID, each supplement provides 350 kcal and 13 grams of protein  Nutritional Services Ambassador to assist patient with meal options.  NUTRITION DIAGNOSIS: Inadequate oral intake related to personal food preferences as evidenced by patient report.   Goal: Intake to meet >90% of estimated nutrition needs.  Monitor:  PO intake, labs, weight trend.  Reason for Assessment: MD Consult for assessment of nutrition status  59 y.o. female  Admitting Dx: Atrial fibrillation with RVR  ASSESSMENT: Patient reports that she is a picky eater. Does not like vegetables. Reviewed meal options and menu with patient. Nutritional Services Ambassador assisting patient with meal choices. She requests Ensure supplements between meals for when she does not like what she receives on her meal tray.  Nutrition focused physical exam completed.  No muscle or subcutaneous fat depletion noticed. Patient reports that she has lost weight intentionally and wants to continue to lose more weight.  Height: Ht Readings from Last 1 Encounters:  10/11/14  (1.626 m)    Weight: Wt Readings from Last 1 Encounters:  10/12/14 154 lb 11.2 oz (70.171 kg)    Ideal Body Weight: 120 lbs  % Ideal Body Weight: 128%  Wt Readings from Last 10 Encounters:  10/12/14 154 lb 11.2 oz (70.171 kg)  09/28/14 157 lb 3.2 oz (71.305 kg)  08/25/14 156 lb (70.761 kg)  07/25/14 162 lb (73.483 kg)  07/20/14 160 lb 6.4 oz (72.757 kg)  06/16/14 167 lb (75.751 kg)  05/23/14 173 lb 9.6 oz (78.744 kg)  05/20/14 170 lb 6.4 oz (77.293 kg)  05/19/14 167 lb (75.751 kg)  05/11/14 168 lb 9.6 oz (76.476 kg)    Usual Body Weight: 156 lbs  % Usual Body Weight: 99%  BMI:  Body mass index is 26.54 kg/(m^2).  Estimated Nutritional Needs: Kcal: 1550-1750 Protein: 75-85 gm Fluid: 1.6-1.8 L  Skin: no  issues  Diet Order: Diet Heart  EDUCATION NEEDS: -Education needs addressed   Intake/Output Summary (Last 24 hours) at 10/12/14 1252 Last data filed at 10/12/14 1220  Gross per 24 hour  Intake 2796.25 ml  Output   1450 ml  Net 1346.25 ml    Last BM: PTA   Labs:   Recent Labs Lab 10/11/14 0954 10/12/14 0222 10/12/14 0857  NA 138 138 140  K 2.4* 2.4* 3.0*  CL 98 106 107  CO2 BUN 29* 24* 19  CREATININE 2.21* 1.66* 1.54*  CALCIUM 9.3 7.7* 8.1*  MG  --   --  1.2*  GLUCOSE 118* 107* 129*    CBG (last 3)  No results for input(s): GLUCAP in the last 72 hours.  Scheduled Meds: . ARIPiprazole  2.5 mg Oral Daily  . buPROPion  300 mg Oral Daily  . docusate sodium  100 mg Oral BID  . levothyroxine  125 mcg Oral QAC breakfast  . [START ON 10/13/2014] potassium chloride  40 mEq Oral Daily  . rivaroxaban  15 mg Oral Q supper  . sodium chloride  3 mL Intravenous Q12H  . topiramate  25 mg Oral Daily   And  . topiramate  50 mg Oral QHS    Continuous Infusions: . sodium chloride 50 mL/hr at 10/12/14 1207    Past Medical History  Diagnosis Date  . CAD (coronary artery disease)     a.  cath 09/2010: LAD stent patent, S-Int/dCFX ok, S-PDA ok,  L-LAD atretic;  b. Lexiscan Myoview (02/2014):  no ischemia, EF 55%   . Hypertension   . Hx of CABG   . Hx of transesophageal echocardiography (TEE) for monitoring 11/2010    TEE 11/2010: EF 60-65%, BAE, trivial atrial septal shunt;  right heart cath in 10/2010 with elevated R and L heart pressures and diuretic started  . HLD (hyperlipidemia)   . Hypothyroidism   . COPD (chronic obstructive pulmonary disease)   . Pulmonary nodules     repeat CT due in 11/2011  . Acute right MCA stroke 11/07/10  . 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 Eczema   . Depression   . Atrial fibrillation     a. s/p TEE-DCCV 02/2104; b. Xarelto started  . Cardiomyopathy with EF 40% at TEE 02/17/14 (likely tachycardia mediated - Myoview  02/19/14 neg for ischemia with normal EF) 02/18/2014  . 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 Sleep apnea     recent sleep study  in 04/2014 per chart review  shows no significant OSA  . Dysrhythmia 10/2014    atrial fib with rvr  . GERD (gastroesophageal reflux disease)   . Headache     Past Surgical History  Procedure Laterality Date  . Coronary artery bypass graft    . Debriment for infection in chest    . Chest wall reconstruction    . Bilateral knee surgery    . Bladder surgery    . Left mastoidectomy    . Hernia repair    . Cath 2012    . Tee without cardioversion N/A 02/17/2014    Procedure: TRANSESOPHAGEAL ECHOCARDIOGRAM (TEE);  Surgeon: Pricilla Riffle, MD;  Location: [facility] ENDOSCOPY;  Service: Cardiovascular;  Laterality: N/A;  . Cardioversion N/A 02/17/2014    Procedure: CARDIOVERSION;  Surgeon: Pricilla Riffle, MD;  Location: [facility] ENDOSCOPY;  Service: Cardiovascular;  Laterality: N/A;    [name], RD, LDN, CNSC Pager [phone] After Hours Pager [phone]

## 2014-10-12 NOTE — Care Management Note (Signed)
    Page 1 of 1   10/14/2014     1:24:59 PM CARE MANAGEMENT NOTE 10/14/2014  Patient:  [name], [name]   Account Number:  [id number]  Date Initiated:  10/12/2014  Documentation initiated by:  GRAVES-BIGELOW,Jaielle Dlouhy  Subjective/Objective Assessment:   Pt admitted for afib, increased troponin and low potassium.     Action/Plan:   Per PT recommendation pt will need a RW for home. DME orders to be placed if pt agrees to DME.   Anticipated DC Date:  10/13/2014   Anticipated DC Plan:  HOME/SELF CARE      DC Planning Services  CM consult      [facility] Choice  HOME HEALTH   Choice offered to / List presented to:  C-1 Patient        HH arranged  HH-1 RN  HH-10 DISEASE MANAGEMENT  HH-6 SOCIAL WORKER      HH agency  [facility]   Status of service:  Completed, signed off Medicare Important Message given?  YES (If response is "NO", the following Medicare IM given date fields will be blank) Date Medicare IM given:  10/14/2014 Medicare IM given by:  GRAVES-BIGELOW,[employer] Date Additional Medicare IM given:   Additional Medicare IM given by:    Discharge Disposition:  HOME W HOME HEALTH SERVICES  Per UR Regulation:  Reviewed for med. necessity/level of care/duration of stay  If discussed at Long Length of Stay Meetings, dates discussed:    Comments:  10-14-14 1321 Tomi Bamberger, RN, BSN [phone] CM did speak with pt in regards to [facility] services and pt chose [facility]. Referral made and SOC to begin within 24-48 hrs post d/c. Pt aware to go to [facility] store to pick up Catheter supplies. Address placed on AVS form. Per pt she will not need RW for home use.  No further needs from CM at this time.

## 2014-10-12 NOTE — Progress Notes (Signed)
Patient Name: [name] Date of Encounter: 10/12/2014     Principal Problem:   Atrial fibrillation with RVR Active Problems:   Chronic diastolic heart failure   Coronary artery disease s/p CABG and prior PCI to LAD   AKI (acute kidney injury)   COPD (chronic obstructive pulmonary disease)   Mood disorder   Excessive daytime sleepiness   Hypokalemia   Renal insufficiency   A-fib    SUBJECTIVE  Chronic CP, no SOB. Wish to leave today  CURRENT MEDS . ARIPiprazole  2.5 mg Oral Daily  . buPROPion  300 mg Oral Daily  . docusate sodium  100 mg Oral BID  . levothyroxine  125 mcg Oral QAC breakfast  . potassium chloride  40 mEq Oral Daily  . rivaroxaban  15 mg Oral Q supper  . sodium chloride  3 mL Intravenous Q12H  . topiramate  25 mg Oral Daily   And  . topiramate  50 mg Oral QHS    OBJECTIVE  Filed Vitals:   10/11/14 1417 10/11/14 2022 10/12/14 0301 10/12/14 0552  BP: 104/79 117/80  107/72  Pulse: 78 72  67  Temp: 97.6 F (36.4 C) 98 F (36.7 C)  97.2 F (36.2 C)  TempSrc: Oral Oral  Oral  Resp: Height:  (1.626 m)     Weight: 148 lb 6.4 oz (67.314 kg)  154 lb 11.2 oz (70.171 kg)   SpO2: 100% 99%  100%    Intake/Output Summary (Last 24 hours) at 10/12/14 0859 Last data filed at 10/12/14 0555  Gross per 24 hour  Intake 2456.25 ml  Output    800 ml  Net 1656.25 ml   Filed Weights   10/11/14 1417 10/12/14 0301  Weight: 148 lb 6.4 oz (67.314 kg) 154 lb 11.2 oz (70.171 kg)    PHYSICAL EXAM  General: Pleasant, NAD. Neuro: Alert and oriented X 3. Moves all extremities spontaneously. Psych: Normal affect. HEENT:  Normal  Neck: Supple without bruits or JVD. Lungs:  Resp regular and unlabored, CTA. Heart: RRR no s3, s4, or murmurs. Abdomen: Soft, non-tender, non-distended, BS + x 4.  Extremities: No clubbing, cyanosis or edema. DP/PT/Radials 2+ and equal bilaterally.  Accessory Clinical Findings  CBC  Recent Labs  10/11/14 0954  10/12/14 0222  WBC 8.6 4.8  NEUTROABS 6.1  --   HGB 14.8 10.9*  HCT 40.8 30.3*  MCV 89.3 89.1  PLT 373 207   Basic Metabolic Panel  Recent Labs  10/11/14 0954 10/12/14 0222  NA 138 138  K 2.4* 2.4*  CL 98 106  CO2 30 24  GLUCOSE 118* 107*  BUN 29* 24*  CREATININE 2.21* 1.66*  CALCIUM 9.3 7.7*   Liver Function Tests  Recent Labs  10/11/14 0954  AST 18  ALT 9  ALKPHOS 85  BILITOT 0.6  PROT 7.4  ALBUMIN 4.4   Cardiac Enzymes  Recent Labs  10/11/14 1544 10/11/14 1952 10/12/14 0222  TROPONINI 0.25* 0.30* 0.22*   Thyroid Function Tests  Recent Labs  10/11/14 1544  TSH 0.092*    TELE NSR with HR 60-70s    ECG  No new EKG  Echocardiogram 05/27/2014  LV EF: 55% -  60%  ------------------------------------------------------------------- Indications:   Atrial fibrillation - 427.31. CHF - 428.0.  ------------------------------------------------------------------- History:  PMH: Acquired from the patient and from the patient&'s chart. Atrial fibrillation. Coronary artery disease. Cardiomyopathy, with an ejection fraction of 53%by radionuclide ventriculography. The dysfunction is  primarily diastolic. Stroke. Chronic obstructive pulmonary disease. Risk factors: Former tobacco use. Hypertension. Obese.  ------------------------------------------------------------------- Study Conclusions  - Left ventricle: The cavity size was normal. Wall thickness was increased in a pattern of moderate LVH. Systolic function was normal. The estimated ejection fraction was in the range of 55% to 60%. Wall motion was normal; there were no regional wall motion abnormalities. Features are consistent with a pseudonormal left ventricular filling pattern, with concomitant abnormal relaxation and increased filling pressure (grade 2 diastolic dysfunction). - Left atrium: The atrium was mildly dilated.    Radiology/Studies  Dg Chest Port 1  View  10/11/2014   CLINICAL DATA:  Chest pain and difficulty breathing  EXAM: PORTABLE CHEST - 1 VIEW  COMPARISON:  March 03, 2014  FINDINGS: There is no edema or consolidation. The heart size and pulmonary vascularity are normal. No adenopathy. Patient is status post coronary artery bypass grafting.  IMPRESSION: No edema or consolidation.   Electronically Signed   By: Bretta Bang M.D.   On: 10/11/2014 11:01    ASSESSMENT AND PLAN  59 yo female with PMH of CAD s/p CABG 2008, PAF, HTN, HLD and CVA present with worsening SOB and orthopnea. Found to have recurrent rapid a-fib  1. PAF with RVR: converted  - continue Xarelto. Relative hypotension does not allow any rate control medication  - TSH low, pending free T4 and T3. H/o hypothyroidism, has been taking synthroid  - Per Dr. Fabio Bering recommendation, resume amiodarone (given the fact patient has already converted, may even consider 200mg  daily of amiodarone to decrease effect on thyroid). Converted yesterday in ED.   2. CAD s/p CABG 2008: stable  - mildly elevated trop, mainly flat, likely demand ischemia  - patient does have chronic chest wall pain after infection post CABG, worse with deep inspiration and palpation. Unlikely cardiac  3. HTN 4. HLD  5. CVA  6. Chronic diastolic HF 7. Hypokalemia 8. Complicated social situation: need social worker  Signed, Azalee Course PA-C Pager: [phone]  I have examined the patient and reviewed assessment and plan and discussed with patient.  Agree with above as stated.  Started amiodarone 400 mg daily per Dr. Eden Emms recs.  Could decrease to 200 mg daily at a later time.   Mayes Sangiovanni S.

## 2014-10-12 NOTE — Evaluation (Signed)
Physical Therapy Evaluation Patient Details Name: [name] MRN: [id number] DOB: [birth year] Today's Date: 10/12/2014   History of Present Illness    59 yo female with PMH of CAD s/p CABG 2008, PAF, HTN, HLD and CVA present with worsening SOB and orthopnea. Found to have recurrent rapid a-fib   Clinical Impression  Pt admitted with above diagnosis. Pt currently with functional limitations due to the deficits listed below (see PT Problem List). Will follow pt acutely to incr ambulation however do not feel that pt will need PT f/u.  Pt has a cane and may want a RW.  Pt will benefit from skilled PT to increase their independence and safety with mobility to allow discharge to the venue listed below.      Follow Up Recommendations Supervision - Intermittent;No PT follow up    Equipment Recommendations  Rolling walker with 5" wheels (pt to decide if she wants one)    Recommendations for Other Services       Precautions / Restrictions Precautions Precautions: Fall Restrictions Weight Bearing Restrictions: No      Mobility  Bed Mobility               General bed mobility comments: Pt sitting on EOB on arrival.    Transfers Overall transfer level: Needs assistance Equipment used: None Transfers: Sit to/from Stand Sit to Stand: Min guard         General transfer comment: Needed steadying assist.    Ambulation/Gait Ambulation/Gait assistance: Min assist;Min guard Ambulation Distance (Feet): 135 Feet Assistive device: None Gait Pattern/deviations: Step-to pattern;Decreased stride length;Ataxic;Staggering left;Staggering right;Narrow base of support   Gait velocity interpretation: Below normal speed for age/gender General Gait Details: Pt with slightly unsteady gait at times.  Pt slightly shaky.  Needed UE support for balance.  Has cane at home but discussed that pt may need RW for safety but pt to think about it.  Feel pt may be close to baseline and as long as she uses  cane at all times she should be ok.    Stairs            Wheelchair Mobility    Modified Rankin (Stroke Patients Only)       Balance Overall balance assessment: Needs assistance;History of Falls Sitting-balance support: No upper extremity supported;Feet supported Sitting balance-Leahy Scale: Fair     Standing balance support: Bilateral upper extremity supported;During functional activity Standing balance-Leahy Scale: Fair Standing balance comment: Pt can stand statically without UE support.               High level balance activites: Direction changes;Turns;Sudden stops High Level Balance Comments: Needs Min guard asssit for some challenges without cane.  With cane, pt supervision.               Pertinent Vitals/Pain Pain Assessment: No/denies pain  VSS    Home Living Family/patient expects to be discharged to:: Private residence Living Arrangements: Non-relatives/Friends Available Help at Discharge: Friend(s);Other (Comment) (housemates are largely independent of each other) Type of Home: House Home Access: Stairs to enter Entrance Stairs-Rails: None Entrance Stairs-Number of Steps: 6 Home Layout: Two level;Bed/bath upstairs Home Equipment: Cane - single point Additional Comments: Pt and son share car.  Pt does shop herself she states and cooks and cleans.      Prior Function Level of Independence: Independent with assistive device(s)         Comments: cane prn     Hand Dominance  Extremity/Trunk Assessment   Upper Extremity Assessment: Defer to OT evaluation           Lower Extremity Assessment: Generalized weakness         Communication   Communication: No difficulties  Cognition Arousal/Alertness: Awake/alert Behavior During Therapy: WFL for tasks assessed/performed Overall Cognitive Status: Within Functional Limits for tasks assessed                      General Comments      Exercises         Assessment/Plan    PT Assessment Patient needs continued PT services  PT Diagnosis Generalized weakness   PT Problem List Decreased activity tolerance;Decreased balance;Decreased mobility;Decreased knowledge of use of DME;Decreased safety awareness;Decreased knowledge of precautions  PT Treatment Interventions Gait training;Stair training;DME instruction;Functional mobility training;Therapeutic activities;Therapeutic exercise;Balance training;Patient/family education   PT Goals (Current goals can be found in the Care Plan section) Acute Rehab PT Goals Patient Stated Goal: to go home PT Goal Formulation: With patient Time For Goal Achievement: 10/19/14 Potential to Achieve Goals: Good    Frequency Min 3X/week   Barriers to discharge Decreased caregiver support      Co-evaluation               End of Session Equipment Utilized During Treatment: Gait belt Activity Tolerance: Patient limited by fatigue Patient left: in bed;with call bell/phone within reach (sitting on EOB) Nurse Communication: Mobility status         Time: 8115-7262 PT Time Calculation (min) (ACUTE ONLY): 15 min   Charges:   PT Evaluation $Initial PT Evaluation Tier I: 1 Procedure     PT G CodesBerline Lopes 20-Oct-2014, 1:39 PM Cortlan Dolin [facility] [place] [facility] [phone] (pager)

## 2014-10-13 DIAGNOSIS — I5032 Chronic diastolic (congestive) heart failure: Secondary | ICD-10-CM

## 2014-10-13 LAB — T3, FREE: T3, Free: 3 pg/mL (ref 2.0–4.4)

## 2014-10-13 LAB — CBC
HEMATOCRIT: 27.3 % — AB (ref 36.0–46.0)
Hemoglobin: 9.6 g/dL — ABNORMAL LOW (ref 12.0–15.0)
MCH: 31.7 pg (ref 26.0–34.0)
MCHC: 35.2 g/dL (ref 30.0–36.0)
MCV: 90.1 fL (ref 78.0–100.0)
PLATELETS: 178 10*3/uL (ref 150–400)
RBC: 3.03 MIL/uL — ABNORMAL LOW (ref 3.87–5.11)
RDW: 13.6 % (ref 11.5–15.5)
WBC: 3.5 10*3/uL — ABNORMAL LOW (ref 4.0–10.5)

## 2014-10-13 LAB — RETICULOCYTES
RBC.: 3.16 MIL/uL — AB (ref 3.87–5.11)
Retic Count, Absolute: 44.2 10*3/uL (ref 19.0–186.0)
Retic Ct Pct: 1.4 % (ref 0.4–3.1)

## 2014-10-13 LAB — BASIC METABOLIC PANEL
Anion gap: 7 (ref 5–15)
BUN: 15 mg/dL (ref 6–23)
CALCIUM: 8.2 mg/dL — AB (ref 8.4–10.5)
CO2: 22 mmol/L (ref 19–32)
CREATININE: 1.28 mg/dL — AB (ref 0.50–1.10)
Chloride: 113 mmol/L — ABNORMAL HIGH (ref 96–112)
GFR calc Af Amer: 53 mL/min — ABNORMAL LOW (ref >90)
GFR calc non Af Amer: 45 mL/min — ABNORMAL LOW (ref >90)
GLUCOSE: 98 mg/dL (ref 70–99)
Potassium: 3 mmol/L — ABNORMAL LOW (ref 3.5–5.1)
Sodium: 142 mmol/L (ref 135–145)

## 2014-10-13 LAB — MAGNESIUM: MAGNESIUM: 1.9 mg/dL (ref 1.5–2.5)

## 2014-10-13 MED ORDER — MAGNESIUM OXIDE 400 (241.3 MG) MG PO TABS
200.0000 mg | ORAL_TABLET | Freq: Two times a day (BID) | ORAL | Status: DC
  Administered 2014-10-13 (×2): 200 mg via ORAL
  Administered 2014-10-14: 13:00:00 via ORAL
  Filled 2014-10-13 (×4): qty 0.5

## 2014-10-13 MED ORDER — POTASSIUM CHLORIDE 20 MEQ PO PACK: 40.0000 meq | PACK | Freq: Two times a day (BID) | ORAL | Status: DC

## 2014-10-13 MED ORDER — ZOLPIDEM TARTRATE 5 MG PO TABS
5.0000 mg | ORAL_TABLET | Freq: Every evening | ORAL | Status: DC | PRN
  Administered 2014-10-14: 5 mg via ORAL
  Filled 2014-10-13: qty 1

## 2014-10-13 MED ORDER — POTASSIUM CHLORIDE CRYS ER 20 MEQ PO TBCR
40.0000 meq | EXTENDED_RELEASE_TABLET | Freq: Two times a day (BID) | ORAL | Status: DC
  Administered 2014-10-13 – 2014-10-14 (×3): 40 meq via ORAL
  Filled 2014-10-13 (×3): qty 2

## 2014-10-13 NOTE — Progress Notes (Signed)
[facility] PROGRESS NOTE  [name] OZH:[id number] DOB: 06-26-1956 DOA: 10/11/2014 PCP: [name], MD  Assessment/Plan: 1-Acute renal failure// Urine retention.  Pre renal. Improved with IV fluids.  Has history of urine retention. Develop urine retention. Continue with foley.  Urology consulted.  Cr peak to 2.2 , it has decrease to 1.5.--1.2  2-Severe Hypokalemia; improved. Will order Kcl 40 Meq BID. Replete mg.   3-A fib; continue with amiodarone. On xarelto.   4-Hypothyroidism;  TSH low.  Free T 3 and T 4 normal. Continue with same dose of synthroid.   5-Hypomagnesemia; received IV mag. Mg this Am at 1.9. Start oral supplementation.   6-Acute hypoxic respiratory Failure:  Related to A fib RVR.  Improved. Nebulizer PRN.   Anemia;  Check anemia panel. Follow hb trend.   Chronic diastolic heart failure  Grade 2 diastolic dysfunction on 9/15 echo. Patient is euvolemic on exam. Holding lasix due to acute renal failure. NSL fluids. 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  Mild elevated troponin; in setting A fib RVR.   Code Status: DNR Family Communication: care discussed with patient.  Disposition Plan: urology consulted.    Consultants:  Cardiology  Procedures:  none  Antibiotics:  none  HPI/Subjective: Wants to go home. Feeling ok, breathing ok.    Objective: Filed Vitals:   10/13/14 1145  BP: 116/63  Pulse: 58  Temp: 96.3 F (35.7 C)  Resp:     Intake/Output Summary (Last 24 hours) at 10/13/14 1605 Last data filed at 10/13/14 1146  Gross per 24 hour  Intake 729.17 ml  Output   1475 ml  Net -745.83 ml   Filed Weights   10/11/14 1417 10/12/14 0301 10/13/14 0445  Weight: 67.314 kg (148 lb 6.4 oz) 70.171 kg (154 lb 11.2 oz) 68.901 kg (151 lb 14.4 oz)    Exam:   General:  Alert in no distress.   Cardiovascular: S 1, S 2  RRR  Respiratory: CTA   Abdomen: bs present, soft, nt  Musculoskeletal: no edema.   Data Reviewed: Basic Metabolic Panel:  Recent Labs Lab 10/11/14 0954 10/12/14 0222 10/12/14 0857 10/13/14 0545  NA 138 138 140 142  K 2.4* 2.4* 3.0* 3.0*  CL 98 106 107 113*  CO2 30 24 27 22   GLUCOSE 118* 107* 129* 98  BUN 29* 24* 19 15  CREATININE 2.21* 1.66* 1.54* 1.28*  CALCIUM 9.3 7.7* 8.1* 8.2*  MG  --   --  1.2* 1.9   Liver Function Tests:  Recent Labs Lab 10/11/14 0954  AST 18  ALT 9  ALKPHOS 85  BILITOT 0.6  PROT 7.4  ALBUMIN 4.4   No results for input(s): LIPASE, AMYLASE in the last 168 hours. No results for input(s): AMMONIA in the last 168 hours. CBC:  Recent Labs Lab 10/11/14 0954 10/12/14 0222 10/13/14 0545  WBC 8.6 4.8 3.5*  NEUTROABS 6.1  --   --   HGB 14.8 10.9* 9.6*  HCT 40.8 30.3* 27.3*  MCV 89.3 89.1 90.1  PLT 373 207 178   Cardiac Enzymes:  Recent Labs Lab 10/11/14 0954 10/11/14 1544 10/11/14 1952 10/12/14 0222  TROPONINI 0.09* 0.25* 0.30* 0.22*   BNP (last 3 results)  Recent Labs  10/11/14 0954  BNP 213.0*    ProBNP (last 3 results)  Recent Labs  02/18/14 1205 03/03/14 1539  PROBNP 1629.0* 644.7*  CBG: No results for input(s): GLUCAP in the last 168 hours.  Recent Results (from the past 240 hour(s))  MRSA PCR Screening     Status: None   Collection Time: 10/11/14  3:49 PM  Result Value Ref Range Status   MRSA by PCR NEGATIVE NEGATIVE Final    Comment:        The GeneXpert MRSA Assay (FDA approved for NASAL specimens only), is one component of a comprehensive MRSA colonization surveillance program. It is not intended to diagnose MRSA infection nor to guide or monitor treatment for MRSA infections.      Studies: No results found.  Scheduled Meds: . amiodarone  400 mg Oral Daily  . ARIPiprazole  2.5 mg Oral Daily  . buPROPion  300 mg Oral Daily  . docusate sodium  100 mg Oral BID  . feeding supplement  (ENSURE COMPLETE)  237 mL Oral TID BM  . levothyroxine  125 mcg Oral QAC breakfast  . magnesium oxide  200 mg Oral BID  . potassium chloride  40 mEq Oral BID  . rivaroxaban  15 mg Oral Q supper  . sodium chloride  3 mL Intravenous Q12H  . tamsulosin  0.4 mg Oral Daily  . topiramate  25 mg Oral Daily   And  . topiramate  50 mg Oral QHS   Continuous Infusions:   Principal Problem:   Atrial fibrillation with RVR Active Problems:   Chronic diastolic heart failure   Coronary artery disease s/p CABG and prior PCI to LAD   AKI (acute kidney injury)   COPD (chronic obstructive pulmonary disease)   Mood disorder   Excessive daytime sleepiness   Hypokalemia   Renal insufficiency   A-fib    Time spent: 35 minutes.     Hartley Barefoot A  [facility] Pager [phone]. If 7PM-7AM, please contact night-coverage at [email or web address], password [facility] 10/13/2014, 4:05 PM  LOS: 2 days

## 2014-10-13 NOTE — Progress Notes (Signed)
Patient Name: [name] Date of Encounter: 10/13/2014     Principal Problem:   Atrial fibrillation with RVR Active Problems:   Chronic diastolic heart failure   Coronary artery disease s/p CABG and prior PCI to LAD   AKI (acute kidney injury)   COPD (chronic obstructive pulmonary disease)   Mood disorder   Excessive daytime sleepiness   Hypokalemia   Renal insufficiency   A-fib    SUBJECTIVE  Chronic CP. Did not have any SOB until I started talking to her (anxiety?)  CURRENT MEDS . amiodarone  400 mg Oral Daily  . ARIPiprazole  2.5 mg Oral Daily  . buPROPion  300 mg Oral Daily  . docusate sodium  100 mg Oral BID  . feeding supplement (ENSURE COMPLETE)  237 mL Oral TID BM  . levothyroxine  125 mcg Oral QAC breakfast  . magnesium oxide  200 mg Oral BID  . potassium chloride  40 mEq Oral Daily  . rivaroxaban  15 mg Oral Q supper  . sodium chloride  3 mL Intravenous Q12H  . tamsulosin  0.4 mg Oral Daily  . topiramate  25 mg Oral Daily   And  . topiramate  50 mg Oral QHS    OBJECTIVE  Filed Vitals:   10/12/14 1430 10/12/14 2012 10/13/14 0445 10/13/14 0749  BP: 127/79 121/72 96/73 96/60   Pulse: 70 74 65 60  Temp: 97.9 F (36.6 C) 98.2 F (36.8 C) 97.3 F (36.3 C) 98.1 F (36.7 C)  TempSrc: Oral Oral Oral Axillary  Resp: Height:      Weight:   151 lb 14.4 oz (68.901 kg)   SpO2: 100% 100% 100% 98%    Intake/Output Summary (Last 24 hours) at 10/13/14 0812 Last data filed at 10/13/14 0446  Gross per 24 hour  Intake 1249.17 ml  Output   1900 ml  Net -650.83 ml   Filed Weights   10/11/14 1417 10/12/14 0301 10/13/14 0445  Weight: 148 lb 6.4 oz (67.314 kg) 154 lb 11.2 oz (70.171 kg) 151 lb 14.4 oz (68.901 kg)    PHYSICAL EXAM  General: Pleasant, NAD. Neuro: Alert and oriented X 3. Moves all extremities spontaneously. Psych: Normal affect. HEENT:  Normal  Neck: Supple without bruits or JVD. Lungs:  Resp regular and unlabored,  CTA. Heart: RRR no s3, s4, or murmurs. Abdomen: Soft, non-tender, non-distended, BS + x 4.  Extremities: No clubbing, cyanosis or edema. DP/PT/Radials 2+ and equal bilaterally.  Accessory Clinical Findings  CBC  Recent Labs  10/11/14 0954 10/12/14 0222  WBC 8.6 4.8  NEUTROABS 6.1  --   HGB 14.8 10.9*  HCT 40.8 30.3*  MCV 89.3 89.1  PLT 373 207   Basic Metabolic Panel  Recent Labs  10/12/14 0857 10/13/14 0545  NA 140 142  K 3.0* 3.0*  CL 107 113*  CO2 27 22  GLUCOSE 129* 98  BUN 19 15  CREATININE 1.54* 1.28*  CALCIUM 8.1* 8.2*  MG 1.2* 1.9   Liver Function Tests  Recent Labs  10/11/14 0954  AST 18  ALT 9  ALKPHOS 85  BILITOT 0.6  PROT 7.4  ALBUMIN 4.4   Cardiac Enzymes  Recent Labs  10/11/14 1544 10/11/14 1952 10/12/14 0222  TROPONINI 0.25* 0.30* 0.22*   Thyroid Function Tests  Recent Labs  10/11/14 1544 10/12/14 0907  TSH 0.092*  --   T3FREE  --  3.0    TELE NSR with HR 60s  ECG  No new EKG  Echocardiogram 05/27/2014  LV EF: 55% -  60%  ------------------------------------------------------------------- Indications:   Atrial fibrillation - 427.31. CHF - 428.0.  ------------------------------------------------------------------- History:  PMH: Acquired from the patient and from the patient&'s chart. Atrial fibrillation. Coronary artery disease. Cardiomyopathy, with an ejection fraction of 53%by radionuclide ventriculography. The dysfunction is primarily diastolic. Stroke. Chronic obstructive pulmonary disease. Risk factors: Former tobacco use. Hypertension. Obese.  ------------------------------------------------------------------- Study Conclusions  - Left ventricle: The cavity size was normal. Wall thickness was increased in a pattern of moderate LVH. Systolic function was normal. The estimated ejection fraction was in the range of 55% to 60%. Wall motion was normal; there were no regional  wall motion abnormalities. Features are consistent with a pseudonormal left ventricular filling pattern, with concomitant abnormal relaxation and increased filling pressure (grade 2 diastolic dysfunction). - Left atrium: The atrium was mildly dilated.    Radiology/Studies  Dg Chest Port 1 View  10/11/2014   CLINICAL DATA:  Chest pain and difficulty breathing  EXAM: PORTABLE CHEST - 1 VIEW  COMPARISON:  March 03, 2014  FINDINGS: There is no edema or consolidation. The heart size and pulmonary vascularity are normal. No adenopathy. Patient is status post coronary artery bypass grafting.  IMPRESSION: No edema or consolidation.   Electronically Signed   By: Bretta Bang M.D.   On: 10/11/2014 11:01    ASSESSMENT AND PLAN  59 yo female with PMH of CAD s/p CABG 2008, PAF, HTN, HLD and CVA present with worsening SOB and orthopnea. Found to have recurrent rapid a-fib  1. PAF with RVR: converted  - CHA2DS2-Vasc score 3 (female, CHF, HTN)   - continue Xarelto. Relative hypotension does not allow any rate control medication  - TSH low, free T4 and T3 normal. H/o hypothyroidism, has been taking synthroid  - per Dr. Fabio Bering recommendation, amiodarone 400mg  daily added. Will decrease to 200mg  daily in 1 month.    - has been maintaining NSR in last 24 hours, no further recommendation, will sign off, likely discharge today after potassium repletion   2. CAD s/p CABG 2008: stable  - mildly elevated trop, mainly flat, likely demand ischemia  - patient does have chronic chest wall pain after infection post CABG, worse with deep inspiration and palpation. No further recommendation by cardiology team. Will arrange post hospital followup. Will sign off today.  3. HTN 4. HLD  5. CVA  6. Chronic diastolic HF: diuretic held with AKI on admission, unclear if AKI is result of decreased perfusion during a-fib with RVR. Per pt she has been taking lasix 40mg  daily for 4 yrs even since she has been  diagnosed with HF. Can potentially discharge on 20mg  lasix daily with additional 20mg  as needed for symptom.   7. Hypokalemia: surprisingly has been going on for 4 yrs, take 80 meq KCL when only on 40mg  daily of lasix, Seems out of proportion. ?if has other etiology responsible for hypokalemia.  8. Complicated social situation: need social worker  Signed, Azalee Course PA-C Pager: [phone] Patient seen and examined and history reviewed. Agree with above findings and plan. Patient is maintaining NSR well on amiodarone. Had 8 beat run of PAT yesterday afternoon. Continue Xarelto for anticoagulation. Continue current amiodarone dose for one month then reduce dose. She is stable for DC from a cardiac standpoint. Will need follow up in our office with Dr. Delton See in 3-4 weeks.   Cortasia Screws [place], MDFACC 10/13/2014 9:26 AM

## 2014-10-13 NOTE — Consult Note (Signed)
Urology Consult   Physician requesting consult: Regalado  Reason for consult: Urinary retention  History of Present Illness: [name] is a 59 y.o. female with PMH significant for CVA, CAD s/p CABG, HTN, COPD, urinary retention, and a fib who was admitted 2 days ago for treatment of rapid afib with RVR.  After admission she complained of an inability to void.  Several PVRs showed greater than 500cc and she was I/O cathed twice.  She was still unable to void with an elevated residual therefore a foley was placed and she was started on Flomax.  UA yesterday was negative. Cr was elevated at 2.21 on admission but has trended down over the past few days.  No abdominal imaging was obtained, therefore, no way to determine if pt had hydro present with rentention.  Renal U/S from 02/2014 was WNL.  She states she has not been able to void "normally" since she had a stroke 4 years ago.  She was started on CICs in the hospital and then followed by Dr. Margarita Grizzle in our office in 2012 for her retention issues.  She did not have urodynamics performed at that time.  She was eventually instructed to perform timed and double voiding and referred to Dr. Sherron Monday for further workup but she moved and was lost to f/u.   She has continued to have issues with voiding but has not returned for care. She has the urge to void but cannot start the urine stream.  She will try several times and then eventually she will void.  She thinks she empties when she voids but is uncertain.  This occurs daily.  She denies dysuria, incontinence, and hematuria.  She had a bladder sling placed for incontinence many years ago.  She has had 2-3 UTIs over the past several years.    During her previous eval with Dr. Margarita Grizzle she was also found to have a 12 x 10mm adrenal nodule. It did not have suspicious features for malignancy or pheochromocytoma, but it could not be classified as a benign adenoma based upon her MRI. He recommended the pt be  evaled by a endocrinologist to determine if she had a functioning adrenal adenoma and for her to have a repeat CT scan in 1 year.  Again, this did not occur as she was lost to f/u.     Past Medical History  Diagnosis Date  . CAD (coronary artery disease)     a.  cath 09/2010: LAD stent patent, S-Int/dCFX ok, S-PDA ok, L-LAD atretic;  b. Lexiscan Myoview (02/2014):  no ischemia, EF 55%   . Hypertension   . Hx of CABG   . Hx of transesophageal echocardiography (TEE) for monitoring 11/2010    TEE 11/2010: EF 60-65%, BAE, trivial atrial septal shunt;  right heart cath in 10/2010 with elevated R and L heart pressures and diuretic started  . HLD (hyperlipidemia)   . Hypothyroidism   . COPD (chronic obstructive pulmonary disease)   . Pulmonary nodules     repeat CT due in 11/2011  . Acute right MCA stroke 11/07/10  . 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 Eczema   . Depression   . Atrial fibrillation     a. s/p TEE-DCCV 02/2104; b. Xarelto started  . Cardiomyopathy with EF 40% at TEE 02/17/14 (likely tachycardia mediated - Myoview 02/19/14 neg for ischemia with normal EF) 02/18/2014  . 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 Sleep apnea     recent sleep study  in 04/2014 per chart review  shows no significant OSA  . Dysrhythmia 10/2014    atrial fib with rvr  . GERD (gastroesophageal reflux disease)   . Headache     Past Surgical History  Procedure Laterality Date  . Coronary artery bypass graft    . Debriment for infection in chest    . Chest wall reconstruction    . Bilateral knee surgery    . Bladder surgery    . Left mastoidectomy    . Hernia repair    . Cath 2012    . Tee without cardioversion N/A 02/17/2014    Procedure: TRANSESOPHAGEAL ECHOCARDIOGRAM (TEE);  Surgeon: Pricilla Riffle, MD;  Location: [facility] ENDOSCOPY;  Service: Cardiovascular;  Laterality: N/A;  . Cardioversion N/A 02/17/2014    Procedure: CARDIOVERSION;  Surgeon: Pricilla Riffle,  MD;  Location: [facility] ENDOSCOPY;  Service: Cardiovascular;  Laterality: N/A;     Current Hospital Medications:  Home meds:    Medication List    ASK your doctor about these medications        albuterol 108 (90 BASE) MCG/ACT inhaler  Commonly known as:  PROVENTIL HFA;VENTOLIN HFA  Inhale 2 puffs into the lungs every 6 (six) hours as needed for wheezing or shortness of breath.     ARIPiprazole 5 MG tablet  Commonly known as:  ABILIFY  1 qam     atorvastatin 80 MG tablet  Commonly known as:  LIPITOR  Take 0.5 tablets (40 mg total) by mouth daily.     buPROPion 150 MG 24 hr tablet  Commonly known as:  WELLBUTRIN XL  1 qam  For 5 days then 2  qam     cyclobenzaprine 5 MG tablet  Commonly known as:  FLEXERIL  Take 1 tablet (5 mg total) by mouth 3 (three) times daily as needed for muscle spasms.     furosemide 40 MG tablet  Commonly known as:  LASIX  Take 0.5 tablets (20 mg total) by mouth daily.     levothyroxine 125 MCG tablet  Commonly known as:  SYNTHROID, LEVOTHROID  Take 1 tablet (125 mcg total) by mouth daily.     magnesium oxide 400 MG tablet  Commonly known as:  MAG-OX  Take 1 tablet (400 mg total) by mouth 2 (two) times daily.     potassium chloride SA 20 MEQ tablet  Commonly known as:  K-DUR,KLOR-CON  Take 4 tablets (80 mEq total) by mouth daily.     rivaroxaban 20 MG Tabs tablet  Commonly known as:  XARELTO  Take 1 tablet (20 mg total) by mouth daily with supper.     topiramate 50 MG tablet  Commonly known as:  TOPAMAX  50mg  in am and 100mg  at night     traMADol 50 MG tablet  Commonly known as:  ULTRAM  TAKE 2 TABLETS BY MOUTH EVERY 6 HOURS AS NEEDED FOR MODERATE PAIN        Scheduled Meds: . amiodarone  400 mg Oral Daily  . ARIPiprazole  2.5 mg Oral Daily  . buPROPion  300 mg Oral Daily  . docusate sodium  100 mg Oral BID  . feeding supplement (ENSURE COMPLETE)  237 mL Oral TID BM  . levothyroxine  125 mcg Oral QAC breakfast  . magnesium oxide   200 mg Oral BID  . potassium chloride  40 mEq Oral BID  .  rivaroxaban  15 mg Oral Q supper  . sodium chloride  3 mL Intravenous Q12H  . tamsulosin  0.4 mg Oral Daily  . topiramate  25 mg Oral Daily   And  . topiramate  50 mg Oral QHS   Continuous Infusions:  PRN Meds:.acetaminophen **OR** acetaminophen, albuterol, alum & mag hydroxide-simeth, cyclobenzaprine, ondansetron **OR** ondansetron (ZOFRAN) IV, traMADol  Allergies:  Allergies  Allergen Reactions  . Amoxicillin Hives  . Avelox [Moxifloxacin Hcl In Nacl] Other (See Comments)    Mental breakdown.  [name] Hydrocodone Itching  . Penicillins Hives  . Sulfa Antibiotics Other (See Comments)    unknown    Family History  Problem Relation Age of Onset  . COPD Mother   . Heart disease Mother   . Arthritis Mother     Rheumatoid and PMR  . Osteoporosis Mother     Mom fractured hip  . Diabetes type II Mother   . Depression Mother   . Heart attack Father   . Depression Father   . Hypertension Father   . Alcohol abuse Father   . Depression Sister   . Anxiety disorder Sister   . Drug abuse Sister     Social History:  reports that she quit smoking about 4 weeks ago. Her smoking use included Cigarettes. She has a 7.5 pack-year smoking history. She has never used smokeless tobacco. She reports that she does not drink alcohol or use illicit drugs.  ROS: A complete review of systems was performed.  All systems are negative except for pertinent findings as noted.  Physical Exam:  Vital signs in last 24 hours: Temp:  [96.3 F (35.7 C)-98.2 F (36.8 C)] 96.3 F (35.7 C) (02/04 1145) Pulse Rate:  [58-74] 58 (02/04 1145) Resp:  [16-18] 18 (02/04 0445) BP: (96-121)/(53-73) 116/63 mmHg (02/04 1145) SpO2:  [98 %-100 %] 100 % (02/04 1145) Weight:  [68.901 kg (151 lb 14.4 oz)] 68.901 kg (151 lb 14.4 oz) (02/04 0445) Constitutional:  Alert and oriented, No acute distress Cardiovascular: Regular rate and rhythm Respiratory: Normal  respiratory effort, Lungs clear bilaterally GI: Abdomen is soft, nontender, nondistended, no abdominal masses GU: foley in place with clear/yellow urine Lymphatic: No lymphadenopathy Neurologic: Grossly intact, no focal deficits Psychiatric: Normal mood and affect  Laboratory Data:   Recent Labs  10/11/14 0954 10/12/14 0222 10/13/14 0545  WBC 8.6 4.8 3.5*  HGB 14.8 10.9* 9.6*  HCT 40.8 30.3* 27.3*  PLT 373 207 178     Recent Labs  10/11/14 0954 10/12/14 0222 10/12/14 0857 10/13/14 0545  NA 138 138 140 142  K 2.4* 2.4* 3.0* 3.0*  CL 98 106 107 113*  GLUCOSE 118* 107* 129* 98  BUN 29* 24* 19 15  CALCIUM 9.3 7.7* 8.1* 8.2*  CREATININE 2.21* 1.66* 1.54* 1.28*     Results for orders placed or performed during the hospital encounter of 10/11/14 (from the past 24 hour(s))  Urinalysis, Routine w reflex microscopic     Status: Abnormal   Collection Time: 10/12/14 10:50 PM  Result Value Ref Range   Color, Urine STRAW (A) YELLOW   APPearance CLEAR CLEAR   Specific Gravity, Urine 1.007 1.005 - 1.030   pH 5.5 5.0 - 8.0   Glucose, UA NEGATIVE NEGATIVE mg/dL   Hgb urine dipstick NEGATIVE NEGATIVE   Bilirubin Urine NEGATIVE NEGATIVE   Ketones, ur NEGATIVE NEGATIVE mg/dL   Protein, ur NEGATIVE NEGATIVE mg/dL   Urobilinogen, UA 0.2 0.0 - 1.0 mg/dL   Nitrite NEGATIVE  NEGATIVE   Leukocytes, UA NEGATIVE NEGATIVE  Basic metabolic panel     Status: Abnormal   Collection Time: 10/13/14  5:45 AM  Result Value Ref Range   Sodium 142 135 - 145 mmol/L   Potassium 3.0 (L) 3.5 - 5.1 mmol/L   Chloride 113 (H) 96 - 112 mmol/L   CO2 22 19 - 32 mmol/L   Glucose, Bld 98 70 - 99 mg/dL   BUN 15 6 - 23 mg/dL   Creatinine, Ser 1.61 (H) 0.50 - 1.10 mg/dL   Calcium 8.2 (L) 8.4 - 10.5 mg/dL   GFR calc non Af Amer 45 (L) >90 mL/min   GFR calc Af Amer 53 (L) >90 mL/min   Anion gap 7 5 - 15  Magnesium     Status: None   Collection Time: 10/13/14  5:45 AM  Result Value Ref Range   Magnesium  1.9 1.5 - 2.5 mg/dL  CBC     Status: Abnormal   Collection Time: 10/13/14  5:45 AM  Result Value Ref Range   WBC 3.5 (L) 4.0 - 10.5 K/uL   RBC 3.03 (L) 3.87 - 5.11 MIL/uL   Hemoglobin 9.6 (L) 12.0 - 15.0 g/dL   HCT 09.6 (L) 04.5 - 40.9 %   MCV 90.1 78.0 - 100.0 fL   MCH 31.7 26.0 - 34.0 pg   MCHC 35.2 30.0 - 36.0 g/dL   RDW 81.1 91.4 - 78.2 %   Platelets 178 150 - 400 K/uL   Recent Results (from the past 240 hour(s))  MRSA PCR Screening     Status: None   Collection Time: 10/11/14  3:49 PM  Result Value Ref Range Status   MRSA by PCR NEGATIVE NEGATIVE Final    Comment:        The GeneXpert MRSA Assay (FDA approved for NASAL specimens only), is one component of a comprehensive MRSA colonization surveillance program. It is not intended to diagnose MRSA infection nor to guide or monitor treatment for MRSA infections.     Renal Function:  Recent Labs  10/11/14 0954 10/12/14 0222 10/12/14 0857 10/13/14 0545  CREATININE 2.21* 1.66* 1.54* 1.28*   Estimated Creatinine Clearance: 46.2 mL/min (by C-G formula based on Cr of 1.28).  Radiologic Imaging: No results found.  Impression/Recommendation: Urinary retention--is a long term issue since her stroke in 2012.  Continue flomax and d/c indwelling foley tomorrow.  Perform void trial after foley d/c and measure PVRs.  If residual is >300cc then  I/O cath.  Pt will likely need to be taught intermittent cathing again which will continue after d/c from hospital.  If she requires CICs then she will need to maintain a diary with the amount voided and amount obtained with cathing.  She will need urodynamics as an outpatient.    Adrenal mass--pt should have f/u CT scan which can be performed as an inpatient or an outpatient. Eval will need to be completed for possible functioning adrenal adenoma.     Valarie Cones, AMANDA 10/13/2014, 3:15 PM   Patient was seen, examined,treatment plan was discussed with the Advance Practice Provider.  I have  directly reviewed the clinical findings, lab, imaging studies and management of this patient in detail. I have made the necessary changes and/or additions to the above noted documentation, and agree with the documentation, as recorded by the Advance Practice Provider.   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 She understands that  there is no medication that will improve this condition however the use of an alpha blocker such as Flomax may reduce her outlet resistance enough that she notes improvement in her voiding and better bladder emptying. She has performed intermittent self-catheterization in the past successfully and said that she feels it will be easier now that she has lost weight. She understands that she will continue to perform CIC until her residuals run 100 mL or less which is her baseline. Her catheter can be removed first thing in the morning at 0500 and if she has an elevated residual or is unable to urinate she will begin performing CIC until her residuals decrease.

## 2014-10-13 NOTE — Progress Notes (Signed)
CSW (Clinical Child psychotherapist) notified that pt reporting having issues getting food. CSW visited pt room and spoke with pt about statement. Pt reports that prior to admission she was having this issue but she has since worked this out. CSW asked pt to clarify what the issue was and what has changed. Pt reported that she was not able to afford food but she is going to budget her check differently now. CSW asked if pt would still like resources. Pt agreeable to receiving resources. At this time, pt reports no other social work needs. CSW signing off.  Judea Riches, LCSWA [phone]

## 2014-10-14 ENCOUNTER — Other Ambulatory Visit: Payer: Self-pay | Admitting: Emergency Medicine

## 2014-10-14 LAB — BASIC METABOLIC PANEL
Anion gap: 5 (ref 5–15)
BUN: 14 mg/dL (ref 6–23)
CALCIUM: 8.7 mg/dL (ref 8.4–10.5)
CHLORIDE: 114 mmol/L — AB (ref 96–112)
CO2: 22 mmol/L (ref 19–32)
Creatinine, Ser: 1.15 mg/dL — ABNORMAL HIGH (ref 0.50–1.10)
GFR calc Af Amer: 60 mL/min — ABNORMAL LOW (ref >90)
GFR calc non Af Amer: 52 mL/min — ABNORMAL LOW (ref >90)
GLUCOSE: 92 mg/dL (ref 70–99)
POTASSIUM: 4.4 mmol/L (ref 3.5–5.1)
SODIUM: 141 mmol/L (ref 135–145)

## 2014-10-14 LAB — IRON AND TIBC
Iron: 27 ug/dL — ABNORMAL LOW (ref 42–145)
Saturation Ratios: 16 % — ABNORMAL LOW (ref 20–55)
TIBC: 174 ug/dL — AB (ref 250–470)
UIBC: 147 ug/dL (ref 125–400)

## 2014-10-14 LAB — FOLATE: Folate: 6.1 ng/mL

## 2014-10-14 LAB — VITAMIN B12: Vitamin B-12: 180 pg/mL — ABNORMAL LOW (ref 211–911)

## 2014-10-14 LAB — FERRITIN: Ferritin: 150 ng/mL (ref 10–291)

## 2014-10-14 MED ORDER — CYANOCOBALAMIN 1000 MCG/ML IJ SOLN
1000.0000 ug | Freq: Once | INTRAMUSCULAR | Status: AC
Start: 2014-10-14 — End: 2014-10-14
  Administered 2014-10-14: 1000 ug via INTRAMUSCULAR
  Filled 2014-10-14: qty 1

## 2014-10-14 MED ORDER — ENSURE COMPLETE PO LIQD: 237.0000 mL | Freq: Three times a day (TID) | ORAL | Status: DC

## 2014-10-14 MED ORDER — FUROSEMIDE 20 MG PO TABS: 20.0000 mg | ORAL_TABLET | Freq: Every day | ORAL | Status: DC

## 2014-10-14 MED ORDER — RIVAROXABAN 20 MG PO TABS: 20.0000 mg | ORAL_TABLET | Freq: Every day | ORAL | Status: DC

## 2014-10-14 MED ORDER — FUROSEMIDE 20 MG PO TABS
20.0000 mg | ORAL_TABLET | Freq: Every day | ORAL | Status: DC
Start: 2014-10-14 — End: 2014-10-14
  Administered 2014-10-14: 20 mg via ORAL
  Filled 2014-10-14: qty 1

## 2014-10-14 MED ORDER — TAMSULOSIN HCL 0.4 MG PO CAPS: 0.4000 mg | ORAL_CAPSULE | Freq: Every day | ORAL | Status: DC

## 2014-10-14 MED ORDER — POTASSIUM CHLORIDE CRYS ER 20 MEQ PO TBCR: 40.0000 meq | EXTENDED_RELEASE_TABLET | Freq: Every day | ORAL | Status: DC

## 2014-10-14 MED ORDER — FERROUS SULFATE 325 (65 FE) MG PO TABS: 325.0000 mg | ORAL_TABLET | Freq: Two times a day (BID) | ORAL | Status: DC

## 2014-10-14 MED ORDER — VITAMIN B-12 1000 MCG PO TABS: 1000.0000 ug | ORAL_TABLET | Freq: Every day | ORAL | Status: DC

## 2014-10-14 MED ORDER — ATORVASTATIN CALCIUM 80 MG PO TABS: 40.0000 mg | ORAL_TABLET | Freq: Every day | ORAL | Status: DC

## 2014-10-14 MED ORDER — CYANOCOBALAMIN 1000 MCG PO TABS: 1000.0000 ug | ORAL_TABLET | Freq: Every day | ORAL | Status: DC

## 2014-10-14 MED ORDER — BUPROPION HCL ER (XL) 300 MG PO TB24: 300.0000 mg | ORAL_TABLET | Freq: Every day | ORAL | Status: DC

## 2014-10-14 MED ORDER — ARIPIPRAZOLE 5 MG PO TABS: 2.5000 mg | ORAL_TABLET | Freq: Every day | ORAL | Status: DC

## 2014-10-14 MED ORDER — MAGNESIUM OXIDE 400 MG PO TABS: 400.0000 mg | ORAL_TABLET | Freq: Two times a day (BID) | ORAL | Status: DC

## 2014-10-14 MED ORDER — AMIODARONE HCL 400 MG PO TABS: 400.0000 mg | ORAL_TABLET | Freq: Every day | ORAL | Status: DC

## 2014-10-14 NOTE — Progress Notes (Signed)
Pt discharged to home per MD order. Pt received and reviewed all discharge instructions and medication information including follow-up appointments and prescription information. Pt verbalized understanding. Pt alert and oriented at discharge with no complaints of pain. Pt IV and telemetry box removed prior to discharge. Pt escorted to private vehicle via wheelchair by nurse tech. Wynston Romey C  

## 2014-10-14 NOTE — Discharge Summary (Signed)
Physician Discharge Summary  [name] TGY:[id number] DOB: [birth year] DOA: 10/11/2014  PCP: Lottie Dawson, MD  Admit date: 10/11/2014 Discharge date: 10/14/2014  Time spent: 35 minutes  Recommendations for Outpatient Follow-up:  Patient will need further prescription for B 12 supplement.  Needs to follow up with urology for further evaluation of urine retention and urodynamic studies.  Needs B-met to follow up renal function and potasium level. Check also mg level.  Follow up with cardiology, need further decrease on amiodarone dose.   Discharge Diagnoses:    Atrial fibrillation with RVR   Chronic diastolic heart failure   Coronary artery disease s/p CABG and prior PCI to LAD   AKI (acute kidney injury)   COPD (chronic obstructive pulmonary disease)   Mood disorder   Excessive daytime sleepiness   Hypokalemia   Renal insufficiency   A-fib   Discharge Condition: Stable.   Diet recommendation: Heart Healthy  Filed Weights   10/12/14 0301 10/13/14 0445 10/14/14 0526  Weight: 70.171 kg (154 lb 11.2 oz) 68.901 kg (151 lb 14.4 oz) 70.8 kg (156 lb 1.4 oz)    History of present illness:  [name] is a 59 y.o. female, with a past medical history of coronary artery disease status post CABG, CVA, grade 2 diastolic dysfunction, A. fib on Xarelto, and asthma/COPD who presents to the emergency department today for chest pain and shortness of breath. She reports that she has been fighting a cold for a couple of weeks and has been becoming progressively short of breath for the past several days. She denies any fever, vomiting, diarrhea or dysuria. In the ER she was found to be in rapid A. fib with RVR. She appears in mild respiratory distress with increased work of breathing. Her troponin is slightly elevated, her potassium is 2.4, and her creatinine is significantly elevated above baseline at 2.21 (baseline is 1.2).  Hospital Course:  1-Acute renal failure// Urine retention.   Pre renal. Improved with IV fluids.  Has history of urine retention. Develop urine retention. Continue with foley.  Urology consulted. Recommend discontinue catheter. Patient can do CIC as needed.  Cr peak to 2.2 , it has decrease to 1.5.--1.2  2-Severe Hypokalemia; improved. Discharge on  Kcl 40 Meq BID. Replete mg.   3-A fib; continue with amiodarone. On xarelto.   4-Hypothyroidism;  TSH low. Free T 3 and T 4 normal. Continue with same dose of synthroid.   5-Hypomagnesemia; received IV mag. Mg this Am at 1.9. Start oral supplementation.   6-Acute hypoxic respiratory Failure:  Related to A fib RVR.  Improved. Nebulizer PRN.   Anemia;  Iron deficiency and B 12 deficiency. IM B 12 given. Will provide prescription for iron and B 12 supplement.   Chronic diastolic heart failure  Grade 2 diastolic dysfunction on 2/87 echo. Patient is euvolemic on exam. Holding lasix due to acute renal failure. NSL fluids. 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  Mild elevated troponin; in setting A fib RVR.   Procedures:  none  Consultations:  Cardiology  Discharge Exam: Filed Vitals:   10/14/14 1341  BP: 137/77  Pulse: 74  Temp: 98 F (36.7 C)  Resp:     General: Alert in no distress.  Cardiovascular: S 1, S 2 RRR Respiratory: CTA  Discharge Instructions   Discharge Instructions    Diet - low sodium heart healthy  Complete by:  As directed      Increase activity slowly    Complete by:  As directed           Discharge Medication List as of 10/14/2014  1:35 PM    START taking these medications   Details  amiodarone (PACERONE) 400 MG tablet Take 1 tablet (400 mg total) by mouth daily., Starting 10/14/2014, Until Discontinued, Print    feeding supplement, ENSURE COMPLETE, (ENSURE COMPLETE) LIQD Take 237 mLs by mouth 3 (three) times daily between meals., Starting 10/14/2014,  Until Discontinued, Print    ferrous sulfate 325 (65 FE) MG tablet Take 1 tablet (325 mg total) by mouth 2 (two) times daily with a meal., Starting 10/14/2014, Until Discontinued, Print    tamsulosin (FLOMAX) 0.4 MG CAPS capsule Take 1 capsule (0.4 mg total) by mouth daily., Starting 10/14/2014, Until Discontinued, Print    vitamin B-12 1000 MCG tablet Take 1 tablet (1,000 mcg total) by mouth daily., Starting 10/15/2014, Until Discontinued, Print      CONTINUE these medications which have CHANGED   Details  ARIPiprazole (ABILIFY) 5 MG tablet Take 0.5 tablets (2.5 mg total) by mouth daily., Starting 10/14/2014, Until Discontinued, Print    atorvastatin (LIPITOR) 80 MG tablet Take 0.5 tablets (40 mg total) by mouth daily., Starting 10/14/2014, Until Discontinued, Print    buPROPion (WELLBUTRIN XL) 300 MG 24 hr tablet Take 1 tablet (300 mg total) by mouth daily., Starting 10/14/2014, Until Discontinued, Print    furosemide (LASIX) 20 MG tablet Take 1 tablet (20 mg total) by mouth daily., Starting 10/14/2014, Until Discontinued, Print    magnesium oxide (MAG-OX) 400 MG tablet Take 1 tablet (400 mg total) by mouth 2 (two) times daily., Starting 10/14/2014, Until Discontinued, Print    potassium chloride SA (K-DUR,KLOR-CON) 20 MEQ tablet Take 2 tablets (40 mEq total) by mouth daily., Starting 10/14/2014, Until Discontinued, Print      CONTINUE these medications which have NOT CHANGED   Details  cyclobenzaprine (FLEXERIL) 5 MG tablet Take 1 tablet (5 mg total) by mouth 3 (three) times daily as needed for muscle spasms., Starting 06/16/2014, Until Discontinued, Normal    levothyroxine (SYNTHROID, LEVOTHROID) 125 MCG tablet Take 1 tablet (125 mcg total) by mouth daily., Starting 11/16/2013, Until Discontinued, Normal    rivaroxaban (XARELTO) 20 MG TABS tablet Take 1 tablet (20 mg total) by mouth daily with supper., Starting 02/20/2014, Until Discontinued, Normal    topiramate (TOPAMAX) 50 MG tablet 60m in am and  1023mat night, Normal    traMADol (ULTRAM) 50 MG tablet TAKE 2 TABLETS BY MOUTH EVERY 6 HOURS AS NEEDED FOR MODERATE PAIN, Print    albuterol (PROVENTIL HFA;VENTOLIN HFA) 108 (90 BASE) MCG/ACT inhaler Inhale 2 puffs into the lungs every 6 (six) hours as needed for wheezing or shortness of breath., Starting 03/06/2014, Until Discontinued, Normal       Allergies  Allergen Reactions  . Amoxicillin Hives  . Avelox [Moxifloxacin Hcl In Nacl] Other (See Comments)    Mental breakdown.  . Marland Kitchenydrocodone Itching  . Penicillins Hives  . Sulfa Antibiotics Other (See Comments)    unknown   Follow-up Information    Follow up with OTClaybon JabsMD In 1 week.   Specialty:  Urology   Contact information:   [address] [phone]     Follow up with NEDorothy SparkMD.   Specialty:  Cardiology   Contact information:   [address] [place] [address] [phone]       Follow up with [facility].   Why:  To pick up in and out cathether supplies.    Contact information:   Address: [address] [place], [place] [address] [phone]      Follow up with [facility]   Specialty:  Home Health Services   Why:  Home Health Services: Registered Nurse and Education officer, museum.    Contact information:   [place] [address] [phone]        The results of significant diagnostics from this hospitalization (including imaging, microbiology, ancillary and laboratory) are listed below for reference.    Significant Diagnostic Studies: Dg Chest Port 1 View  10/11/2014   CLINICAL DATA:  Chest pain and difficulty breathing  EXAM: PORTABLE CHEST - 1 VIEW  COMPARISON:  March 03, 2014  FINDINGS: There is no edema or consolidation. The heart size and pulmonary vascularity are normal. No adenopathy. Patient is status post coronary artery bypass grafting.  IMPRESSION: No edema or consolidation.   Electronically  Signed   By: Lowella Grip M.D.   On: 10/11/2014 11:01    Microbiology: Recent Results (from the past 240 hour(s))  MRSA PCR Screening     Status: None   Collection Time: 10/11/14  3:49 PM  Result Value Ref Range Status   MRSA by PCR NEGATIVE NEGATIVE Final    Comment:        The GeneXpert MRSA Assay (FDA approved for NASAL specimens only), is one component of a comprehensive MRSA colonization surveillance program. It is not intended to diagnose MRSA infection nor to guide or monitor treatment for MRSA infections.      Labs: Basic Metabolic Panel:  Recent Labs Lab 10/11/14 0954 10/12/14 0222 10/12/14 0857 10/13/14 0545 10/14/14 0413  NA 138 138 140 142 141  K 2.4* 2.4* 3.0* 3.0* 4.4  CL 98 106 107 113* 114*  CO2 '30 24 27 22 22  ' GLUCOSE 118* 107* 129* 98 92  BUN 29* 24* '19 15 14  ' CREATININE 2.21* 1.66* 1.54* 1.28* 1.15*  CALCIUM 9.3 7.7* 8.1* 8.2* 8.7  MG  --   --  1.2* 1.9  --    Liver Function Tests:  Recent Labs Lab 10/11/14 0954  AST 18  ALT 9  ALKPHOS 85  BILITOT 0.6  PROT 7.4  ALBUMIN 4.4   No results for input(s): LIPASE, AMYLASE in the last 168 hours. No results for input(s): AMMONIA in the last 168 hours. CBC:  Recent Labs Lab 10/11/14 0954 10/12/14 0222 10/13/14 0545  WBC 8.6 4.8 3.5*  NEUTROABS 6.1  --   --   HGB 14.8 10.9* 9.6*  HCT 40.8 30.3* 27.3*  MCV 89.3 89.1 90.1  PLT 373 207 178   Cardiac Enzymes:  Recent Labs Lab 10/11/14 0954 10/11/14 1544 10/11/14 1952 10/12/14 0222  TROPONINI 0.09* 0.25* 0.30* 0.22*   BNP: BNP (last 3 results)  Recent Labs  10/11/14 0954  BNP 213.0*    ProBNP (last 3 results)  Recent Labs  02/18/14 1205 03/03/14 1539  PROBNP 1629.0* 644.7*    CBG: No results for input(s): GLUCAP in the last 168 hours.     SignedNiel Hummer A  [facility] 10/14/2014, 7:47 PM

## 2014-10-14 NOTE — Progress Notes (Signed)
Patient Name: [name] Date of Encounter: 10/14/2014     Principal Problem:   Atrial fibrillation with RVR Active Problems:   Chronic diastolic heart failure   Coronary artery disease s/p CABG and prior PCI to LAD   AKI (acute kidney injury)   COPD (chronic obstructive pulmonary disease)   Mood disorder   Excessive daytime sleepiness   Hypokalemia   Renal insufficiency   A-fib    SUBJECTIVE  Chronic CP, no SOB. Wishes to leave today  CURRENT MEDS . amiodarone  400 mg Oral Daily  . ARIPiprazole  2.5 mg Oral Daily  . buPROPion  300 mg Oral Daily  . cyanocobalamin  1,000 mcg Intramuscular Once  . docusate sodium  100 mg Oral BID  . feeding supplement (ENSURE COMPLETE)  237 mL Oral TID BM  . ferrous sulfate  325 mg Oral BID WC  . furosemide  20 mg Oral Daily  . levothyroxine  125 mcg Oral QAC breakfast  . magnesium oxide  200 mg Oral BID  . potassium chloride  40 mEq Oral BID  . rivaroxaban  20 mg Oral Q supper  . sodium chloride  3 mL Intravenous Q12H  . tamsulosin  0.4 mg Oral Daily  . topiramate  25 mg Oral Daily   And  . topiramate  50 mg Oral QHS  . [START ON 10/15/2014] vitamin B-12  1,000 mcg Oral Daily    OBJECTIVE  Filed Vitals:   10/14/14 0000 10/14/14 0400 10/14/14 0526 10/14/14 0800  BP: 113/65 128/73  117/70  Pulse: 70 70  74  Temp: 97.4 F (36.3 C) 97.6 F (36.4 C)  97.5 F (36.4 C)  TempSrc: Oral Oral  Oral  Resp: 14 16  18   Height:      Weight:   156 lb 1.4 oz (70.8 kg)   SpO2: 100% 99%  100%    Intake/Output Summary (Last 24 hours) at 10/14/14 1209 Last data filed at 10/14/14 1045  Gross per 24 hour  Intake    960 ml  Output    860 ml  Net    100 ml   Filed Weights   10/12/14 0301 10/13/14 0445 10/14/14 0526  Weight: 154 lb 11.2 oz (70.171 kg) 151 lb 14.4 oz (68.901 kg) 156 lb 1.4 oz (70.8 kg)    PHYSICAL EXAM  General: Pleasant, NAD. Neuro: Alert and oriented X 3. Moves all extremities spontaneously. Psych: Normal  affect. HEENT:  Normal  Neck: Supple without bruits or JVD. Lungs:  Resp regular and unlabored, CTA. Heart: RRR no s3, s4, or murmurs. Abdomen: Soft, non-tender, non-distended, BS + x 4.  Extremities: No clubbing, cyanosis or edema. DP/PT/Radials 2+ and equal bilaterally.  Accessory Clinical Findings  CBC  Recent Labs  10/12/14 0222 10/13/14 0545  WBC 4.8 3.5*  HGB 10.9* 9.6*  HCT 30.3* 27.3*  MCV 89.1 90.1  PLT 207 178   Basic Metabolic Panel  Recent Labs  10/12/14 0857 10/13/14 0545 10/14/14 0413  NA 140 142 141  K 3.0* 3.0* 4.4  CL 107 113* 114*  CO2 27 22 22   GLUCOSE 129* 98 92  BUN 19 15 14   CREATININE 1.54* 1.28* 1.15*  CALCIUM 8.1* 8.2* 8.7  MG 1.2* 1.9  --    Liver Function Tests No results for input(s): AST, ALT, ALKPHOS, BILITOT, PROT, ALBUMIN in the last 72 hours. Cardiac Enzymes  Recent Labs  10/11/14 1544 10/11/14 1952 10/12/14 0222  TROPONINI 0.25* 0.30* 0.22*  Thyroid Function Tests  Recent Labs  10/11/14 1544 10/12/14 0907  TSH 0.092*  --   T3FREE  --  3.0    TELE NSR with HR 60-70s    ECG  No new EKG  Echocardiogram 05/27/2014  LV EF: 55% -  60%  ------------------------------------------------------------------- Indications:   Atrial fibrillation - 427.31. CHF - 428.0.  ------------------------------------------------------------------- History:  PMH: Acquired from the patient and from the patient&'s chart. Atrial fibrillation. Coronary artery disease. Cardiomyopathy, with an ejection fraction of 53%by radionuclide ventriculography. The dysfunction is primarily diastolic. Stroke. Chronic obstructive pulmonary disease. Risk factors: Former tobacco use. Hypertension. Obese.  ------------------------------------------------------------------- Study Conclusions  - Left ventricle: The cavity size was normal. Wall thickness was increased in a pattern of moderate LVH. Systolic function was normal. The  estimated ejection fraction was in the range of 55% to 60%. Wall motion was normal; there were no regional wall motion abnormalities. Features are consistent with a pseudonormal left ventricular filling pattern, with concomitant abnormal relaxation and increased filling pressure (grade 2 diastolic dysfunction). - Left atrium: The atrium was mildly dilated.    Radiology/Studies  Dg Chest Port 1 View  10/11/2014   CLINICAL DATA:  Chest pain and difficulty breathing  EXAM: PORTABLE CHEST - 1 VIEW  COMPARISON:  March 03, 2014  FINDINGS: There is no edema or consolidation. The heart size and pulmonary vascularity are normal. No adenopathy. Patient is status post coronary artery bypass grafting.  IMPRESSION: No edema or consolidation.   Electronically Signed   By: Bretta Bang M.D.   On: 10/11/2014 11:01    ASSESSMENT AND PLAN  59 yo female with PMH of CAD s/p CABG 2008, PAF, HTN, HLD and CVA present with worsening SOB and orthopnea. Found to have recurrent rapid a-fib  1. PAF with RVR: converted  - continue Xarelto. Relative hypotension does not allow any rate control medication  - hypothyroid: has been taking synthroid  Reviewed tele. She had some PAT early this morning.  No VTach noted.    2. CAD s/p CABG 2008: stable  - mildly elevated trop, mainly flat, likely demand ischemia  - patient does have chronic chest wall pain after infection post CABG, worse with deep inspiration and palpation.   3. HTN 4. HLD  5. CVA  6. Chronic diastolic HF 7. Hypokalemia 8. Complicated social situation: need Child psychotherapist.  Encouraged her to have HHRN to visit.  Stressed importance of avoiding falls due to anticoagulation.   Renal function improving.  Signed,     Elizeth Weinrich S.

## 2014-10-14 NOTE — Progress Notes (Signed)
Cardiologist on call notified of pt having a run of SVT hr up to 150. Pt asymptomatic & asleep. No new orders given. Will continue to monitor the pt. Sanda Linger, RN

## 2014-10-14 NOTE — Progress Notes (Signed)
Physical Therapy Treatment and D/C Patient Details Name: [name] MRN: [id number] DOB: [birth year] Today's Date: 10/14/2014    History of Present Illness   59 y.o. female with PMH of HTN, CAD s/p CABG, CHF, COPD, h/o CVA, recently admitted by cardiology for PAF s/p DCCV (6/11) Presented with SOB, dizziness, palpitations, found in a fib RVR, and AKI, UTI    PT Comments    Pt admitted with above diagnosis. Pt currently without significant functional limitations and is independent in controlled environment.  Pt will  Not need skilled PT at this time.  Met goals.  Does not need equipment at home other than cane that she has already and does not need HHPT.  Thanks. Will sign off.      Follow Up Recommendations  Supervision - Intermittent;No PT follow up     Equipment Recommendations  None recommended by PT    Recommendations for Other Services       Precautions / Restrictions Precautions Precautions: Fall Restrictions Weight Bearing Restrictions: No    Mobility  Bed Mobility               General bed mobility comments: Pt sitting on EOB on arrival.    Transfers Overall transfer level: Independent                  Ambulation/Gait Ambulation/Gait assistance: Supervision Ambulation Distance (Feet): 180 Feet Assistive device: None Gait Pattern/deviations: Step-through pattern;Decreased stride length   Gait velocity interpretation: at or above normal speed for age/gender General Gait Details: Pt ambulating much better today.  Not unsteady or shaky.  no significant LOB.  Pt will be ok to use cane on d/c prn.     Stairs            Wheelchair Mobility    Modified Rankin (Stroke Patients Only)       Balance Overall balance assessment: Independent         Standing balance support: During functional activity;No upper extremity supported Standing balance-Leahy Scale: Good               High level balance activites: Direction  changes;Turns;Sudden stops;Head turns High Level Balance Comments: supervision for above.    Cognition Arousal/Alertness: Awake/alert Behavior During Therapy: WFL for tasks assessed/performed Overall Cognitive Status: Within Functional Limits for tasks assessed                      Exercises      General Comments General comments (skin integrity, edema, etc.): Pt scored 21/24 on DGI placing at low risk of falls.  Improved gait since last visit.        Pertinent Vitals/Pain Pain Assessment: No/denies pain  VSS    Home Living                      Prior Function            PT Goals (current goals can now be found in the care plan section) Progress towards PT goals: Goals met/education completed, patient discharged from PT    Frequency  Min 3X/week    PT Plan Current plan remains appropriate    Co-evaluation             End of Session Equipment Utilized During Treatment: Gait belt Activity Tolerance: Patient tolerated treatment well Patient left: in bed;with call bell/phone within reach (sitting on EOB)     Time: 1141-1150 PT Time Calculation (min) (ACUTE ONLY):  9 min  Charges:  $Gait Training: 8-22 mins                    G Codes:      Denice Paradise Nov 09, 2014, 1:34 PM [facility] [phone] (pager)

## 2014-10-15 DIAGNOSIS — J449 Chronic obstructive pulmonary disease, unspecified: Secondary | ICD-10-CM | POA: Diagnosis not present

## 2014-10-15 DIAGNOSIS — F329 Major depressive disorder, single episode, unspecified: Secondary | ICD-10-CM | POA: Diagnosis not present

## 2014-10-15 DIAGNOSIS — I5042 Chronic combined systolic (congestive) and diastolic (congestive) heart failure: Secondary | ICD-10-CM | POA: Diagnosis not present

## 2014-10-15 DIAGNOSIS — I4891 Unspecified atrial fibrillation: Secondary | ICD-10-CM | POA: Diagnosis not present

## 2014-10-15 DIAGNOSIS — N19 Unspecified kidney failure: Secondary | ICD-10-CM | POA: Diagnosis not present

## 2014-10-15 DIAGNOSIS — J45909 Unspecified asthma, uncomplicated: Secondary | ICD-10-CM | POA: Diagnosis not present

## 2014-10-15 DIAGNOSIS — Z87891 Personal history of nicotine dependence: Secondary | ICD-10-CM | POA: Diagnosis not present

## 2014-10-15 DIAGNOSIS — Z9181 History of falling: Secondary | ICD-10-CM | POA: Diagnosis not present

## 2014-10-15 DIAGNOSIS — I69354 Hemiplegia and hemiparesis following cerebral infarction affecting left non-dominant side: Secondary | ICD-10-CM | POA: Diagnosis not present

## 2014-10-15 DIAGNOSIS — I2510 Atherosclerotic heart disease of native coronary artery without angina pectoris: Secondary | ICD-10-CM | POA: Diagnosis not present

## 2014-10-17 DIAGNOSIS — J449 Chronic obstructive pulmonary disease, unspecified: Secondary | ICD-10-CM | POA: Diagnosis not present

## 2014-10-17 DIAGNOSIS — I69354 Hemiplegia and hemiparesis following cerebral infarction affecting left non-dominant side: Secondary | ICD-10-CM | POA: Diagnosis not present

## 2014-10-17 DIAGNOSIS — J45909 Unspecified asthma, uncomplicated: Secondary | ICD-10-CM | POA: Diagnosis not present

## 2014-10-17 DIAGNOSIS — I4891 Unspecified atrial fibrillation: Secondary | ICD-10-CM | POA: Diagnosis not present

## 2014-10-17 DIAGNOSIS — I5042 Chronic combined systolic (congestive) and diastolic (congestive) heart failure: Secondary | ICD-10-CM | POA: Diagnosis not present

## 2014-10-17 DIAGNOSIS — N19 Unspecified kidney failure: Secondary | ICD-10-CM | POA: Diagnosis not present

## 2014-10-18 DIAGNOSIS — J449 Chronic obstructive pulmonary disease, unspecified: Secondary | ICD-10-CM | POA: Diagnosis not present

## 2014-10-18 DIAGNOSIS — I69354 Hemiplegia and hemiparesis following cerebral infarction affecting left non-dominant side: Secondary | ICD-10-CM | POA: Diagnosis not present

## 2014-10-18 DIAGNOSIS — N19 Unspecified kidney failure: Secondary | ICD-10-CM | POA: Diagnosis not present

## 2014-10-18 DIAGNOSIS — I5042 Chronic combined systolic (congestive) and diastolic (congestive) heart failure: Secondary | ICD-10-CM | POA: Diagnosis not present

## 2014-10-18 DIAGNOSIS — J45909 Unspecified asthma, uncomplicated: Secondary | ICD-10-CM | POA: Diagnosis not present

## 2014-10-18 DIAGNOSIS — I4891 Unspecified atrial fibrillation: Secondary | ICD-10-CM | POA: Diagnosis not present

## 2014-10-20 ENCOUNTER — Ambulatory Visit (HOSPITAL_COMMUNITY): Payer: Self-pay | Admitting: Clinical

## 2014-10-20 ENCOUNTER — Telehealth: Payer: Self-pay | Admitting: Cardiology

## 2014-10-20 DIAGNOSIS — I5032 Chronic diastolic (congestive) heart failure: Secondary | ICD-10-CM

## 2014-10-20 DIAGNOSIS — N179 Acute kidney failure, unspecified: Secondary | ICD-10-CM

## 2014-10-20 MED ORDER — FUROSEMIDE 40 MG PO TABS: 40.0000 mg | ORAL_TABLET | Freq: Every day | ORAL | Status: DC

## 2014-10-20 NOTE — Telephone Encounter (Signed)
Pt calling as instructed to, to report weight gain greater than 2 lbs since being discharged from the hospital.  Pt reports weight readings as :  10/18/14-  153.4 lbs,  10/19/14-  156.6 lbs,  10/20/14-  160 lbs.  Pt complains of no increase in sob, for she states is always sob.  Pt denies any DOE, cp, palpitations, dizziness, pre-syncopal, or syncopal episodes.  Pt states she has very mild edema in her lower extremities.  Pt states she has great urine output with clear appearance.  Pt states she did have pizza for the past 2 nights, which was high in sodium.  Pt states she's in no acute distress, just worried about her weight gain in such a short period of time.  Pt states when she was in the hospital they decreased her lasix from 40 mg to 20 mg due to abnormal bmet/creatinine level was elevated.  Pt would like for Dr Delton See to advise on a different regimen or plan for the weight gain.  Advised the pt to make sure she is staying hydrated with water, and to decrease her intake of sodium to a very healthy minimum.  Informed the pt that Dr Delton See is currently out of the office today, but I will route this message to her for further review and recommendation and follow-up thereafter.  Pt verbalized understanding and agrees with this plan.

## 2014-10-20 NOTE — Telephone Encounter (Signed)
New Message    Patient needs a call back regarding HF, Wt gain. Needs to speak with nurse.  Thanks,

## 2014-10-20 NOTE — Telephone Encounter (Signed)
She should increase lasix to 40 mg po daily and come for BMP on Monday.

## 2014-10-20 NOTE — Telephone Encounter (Signed)
Pt contacted that per Dr Delton See she should increase her Lasix to 40 mg po daily and come in for a bmet for next Monday 10/24/14.  Pt states she has enough supply of Lasix 40 mg because of being on this med in the past.  Scheduled the pt for lab appt for 10/24/14 to check a bmet.  Pt verbalized understanding and agrees with this plan.

## 2014-10-21 DIAGNOSIS — I69354 Hemiplegia and hemiparesis following cerebral infarction affecting left non-dominant side: Secondary | ICD-10-CM | POA: Diagnosis not present

## 2014-10-21 DIAGNOSIS — J45909 Unspecified asthma, uncomplicated: Secondary | ICD-10-CM | POA: Diagnosis not present

## 2014-10-21 DIAGNOSIS — I4891 Unspecified atrial fibrillation: Secondary | ICD-10-CM | POA: Diagnosis not present

## 2014-10-21 DIAGNOSIS — I5042 Chronic combined systolic (congestive) and diastolic (congestive) heart failure: Secondary | ICD-10-CM | POA: Diagnosis not present

## 2014-10-21 DIAGNOSIS — N19 Unspecified kidney failure: Secondary | ICD-10-CM | POA: Diagnosis not present

## 2014-10-21 DIAGNOSIS — J449 Chronic obstructive pulmonary disease, unspecified: Secondary | ICD-10-CM | POA: Diagnosis not present

## 2014-10-24 ENCOUNTER — Other Ambulatory Visit: Payer: Self-pay

## 2014-10-24 ENCOUNTER — Ambulatory Visit: Payer: Self-pay | Admitting: Neurology

## 2014-10-24 DIAGNOSIS — I4891 Unspecified atrial fibrillation: Secondary | ICD-10-CM | POA: Diagnosis not present

## 2014-10-25 ENCOUNTER — Other Ambulatory Visit (INDEPENDENT_AMBULATORY_CARE_PROVIDER_SITE_OTHER): Payer: Medicare Other | Admitting: *Deleted

## 2014-10-25 DIAGNOSIS — N179 Acute kidney failure, unspecified: Secondary | ICD-10-CM | POA: Diagnosis not present

## 2014-10-25 DIAGNOSIS — I5032 Chronic diastolic (congestive) heart failure: Secondary | ICD-10-CM | POA: Diagnosis not present

## 2014-10-25 LAB — BASIC METABOLIC PANEL
BUN: 25 mg/dL — ABNORMAL HIGH (ref 6–23)
CO2: 29 mEq/L (ref 19–32)
Calcium: 9.2 mg/dL (ref 8.4–10.5)
Chloride: 104 mEq/L (ref 96–112)
Creatinine, Ser: 1.52 mg/dL — ABNORMAL HIGH (ref 0.40–1.20)
GFR: 37.46 mL/min — ABNORMAL LOW (ref >60.00)
Glucose, Bld: 80 mg/dL (ref 70–99)
Potassium: 3.6 mEq/L (ref 3.5–5.1)
Sodium: 140 mEq/L (ref 135–145)

## 2014-10-26 ENCOUNTER — Telehealth: Payer: Self-pay | Admitting: *Deleted

## 2014-10-26 DIAGNOSIS — I5032 Chronic diastolic (congestive) heart failure: Secondary | ICD-10-CM

## 2014-10-26 DIAGNOSIS — R7989 Other specified abnormal findings of blood chemistry: Secondary | ICD-10-CM

## 2014-10-26 DIAGNOSIS — N179 Acute kidney failure, unspecified: Secondary | ICD-10-CM

## 2014-10-26 NOTE — Addendum Note (Signed)
Addended by: Loa Socks on: 10/26/2014 06:02 PM   Modules accepted: Orders

## 2014-10-26 NOTE — Telephone Encounter (Signed)
-----   Message from Katarina H Nelson, MD sent at 10/25/2014  4:54 PM EST ----- Her Crea is mildly elevated, I would discontinue lasix and KCL, recheck BMP in 1 month. 

## 2014-10-26 NOTE — Telephone Encounter (Signed)
-----   Message from Lars Masson, MD sent at 10/25/2014  4:54 PM EST ----- Her Crea is mildly elevated, I would discontinue lasix and KCL, recheck BMP in 1 month.

## 2014-10-26 NOTE — Telephone Encounter (Signed)
Called the pt to inform her of her lab results and that per Dr Delton See her creatinine is mildly elevated and she recommends that she hold her Lasix and KCL and recheck BMET in one month.  Pt concerned with this new order to hold both meds, for she states she gained 7 lbs in less than 24 hours.  Pt reports she is mildly sob, and does have LEE, and mild abdominal bloating.  Pt states her urine output has remained the same, and she is putting out appropriately.  Pt reports she is in no acute distress at this time.  Pt does report that she has not been eating appropriately over the course of 2 days, stating that she has eaten foods loaded with sodium, like bacon.  Pt would like clarification from Dr Delton See if she should still hold both meds with the weight gain.  Informed the pt that Dr Delton See is currently out of the office, but I will route this message to her for further review and recommendation and follow-up thereafter.  Pt verbalized understanding and agrees with this plan.

## 2014-10-26 NOTE — Telephone Encounter (Signed)
Informed the pt that I received a clarification order for the pt to continue her current dose of Lasix and KCL due to the recent acute weight gain, and come in for repeat lab to check a BMET in one week per DOD Flex Wilburt Finlay PA-C.  Scheduled pt a lab appt for next Wednesday 11/02/14 to check a bmet.  Advised the pt to be compliant with minimizing her sodium intake to a healthy minimum and drink water.  Advised the to continue weighing herself every morning after urination and logging this information.  Advised the pt to contact immediate medical attention if she becomes acutely ill with cp, sob, palpitations, dizziness, increase in LEE, pre-syncopal or syncopal episodes.  Informed the pt that I will route this message to Dr Delton See for her review.  Pt verbalized instructions given and agrees with this plan.

## 2014-10-29 DIAGNOSIS — I4891 Unspecified atrial fibrillation: Secondary | ICD-10-CM | POA: Diagnosis not present

## 2014-10-29 DIAGNOSIS — N19 Unspecified kidney failure: Secondary | ICD-10-CM | POA: Diagnosis not present

## 2014-10-29 DIAGNOSIS — I69354 Hemiplegia and hemiparesis following cerebral infarction affecting left non-dominant side: Secondary | ICD-10-CM | POA: Diagnosis not present

## 2014-10-29 DIAGNOSIS — I5042 Chronic combined systolic (congestive) and diastolic (congestive) heart failure: Secondary | ICD-10-CM | POA: Diagnosis not present

## 2014-10-29 DIAGNOSIS — J45909 Unspecified asthma, uncomplicated: Secondary | ICD-10-CM | POA: Diagnosis not present

## 2014-10-29 DIAGNOSIS — J449 Chronic obstructive pulmonary disease, unspecified: Secondary | ICD-10-CM | POA: Diagnosis not present

## 2014-11-01 ENCOUNTER — Ambulatory Visit (INDEPENDENT_AMBULATORY_CARE_PROVIDER_SITE_OTHER): Payer: Medicare Other | Admitting: Clinical

## 2014-11-01 ENCOUNTER — Encounter (HOSPITAL_COMMUNITY): Payer: Self-pay | Admitting: Clinical

## 2014-11-01 DIAGNOSIS — F332 Major depressive disorder, recurrent severe without psychotic features: Secondary | ICD-10-CM

## 2014-11-01 DIAGNOSIS — J449 Chronic obstructive pulmonary disease, unspecified: Secondary | ICD-10-CM | POA: Diagnosis not present

## 2014-11-01 DIAGNOSIS — N19 Unspecified kidney failure: Secondary | ICD-10-CM | POA: Diagnosis not present

## 2014-11-01 DIAGNOSIS — I5042 Chronic combined systolic (congestive) and diastolic (congestive) heart failure: Secondary | ICD-10-CM | POA: Diagnosis not present

## 2014-11-01 DIAGNOSIS — J45909 Unspecified asthma, uncomplicated: Secondary | ICD-10-CM | POA: Diagnosis not present

## 2014-11-01 DIAGNOSIS — I69354 Hemiplegia and hemiparesis following cerebral infarction affecting left non-dominant side: Secondary | ICD-10-CM | POA: Diagnosis not present

## 2014-11-01 DIAGNOSIS — I4891 Unspecified atrial fibrillation: Secondary | ICD-10-CM | POA: Diagnosis not present

## 2014-11-02 ENCOUNTER — Other Ambulatory Visit: Payer: Self-pay | Admitting: Cardiology

## 2014-11-02 ENCOUNTER — Other Ambulatory Visit (INDEPENDENT_AMBULATORY_CARE_PROVIDER_SITE_OTHER): Payer: Medicare Other | Admitting: *Deleted

## 2014-11-02 ENCOUNTER — Telehealth: Payer: Self-pay | Admitting: *Deleted

## 2014-11-02 DIAGNOSIS — I5032 Chronic diastolic (congestive) heart failure: Secondary | ICD-10-CM

## 2014-11-02 DIAGNOSIS — N179 Acute kidney failure, unspecified: Secondary | ICD-10-CM

## 2014-11-02 DIAGNOSIS — R7989 Other specified abnormal findings of blood chemistry: Secondary | ICD-10-CM

## 2014-11-02 DIAGNOSIS — R748 Abnormal levels of other serum enzymes: Secondary | ICD-10-CM

## 2014-11-02 LAB — BASIC METABOLIC PANEL
BUN: 22 mg/dL (ref 6–23)
CO2: 28 mEq/L (ref 19–32)
Calcium: 9.5 mg/dL (ref 8.4–10.5)
Chloride: 104 mEq/L (ref 96–112)
Creatinine, Ser: 1.37 mg/dL — ABNORMAL HIGH (ref 0.40–1.20)
GFR: 42.23 mL/min — ABNORMAL LOW (ref >60.00)
Glucose, Bld: 104 mg/dL — ABNORMAL HIGH (ref 70–99)
Potassium: 3.3 mEq/L — ABNORMAL LOW (ref 3.5–5.1)
Sodium: 139 mEq/L (ref 135–145)

## 2014-11-02 NOTE — Telephone Encounter (Signed)
-----   Message from Lars Masson, MD sent at 11/02/2014  2:48 PM EST ----- She should increase potassium to 60 mEq daily until the next visit on 11/08/14.

## 2014-11-02 NOTE — Telephone Encounter (Signed)
Informed the pt that per Dr Delton See she should increase her potassium to 60 mEq daily until the next OV on 11/08/14.  Informed the pt that she is currently taking 40 mEq daily, so she should add an extra tablet on 20 mEq of K to that regimen for she is taking the 20 mEq tablets currently.  Pt verbalized understanding and agrees with this plan.

## 2014-11-02 NOTE — Progress Notes (Signed)
   THERAPIST PROGRESS NOTE  Session Time: 8:05 -9:05  Participation Level: Active  Behavioral Response: Casual and NeatAlertDepressed  Type of Therapy: Individual Therapy  Treatment Goals addressed: improve psychiatric symptoms, emotional regulation skills, calming skills   Interventions: Motivational Interviewing and Other: Grounding and mindfulness  Summary: [name] is a 59 y.o. female who presents with Major depressive disorder, recurrent, severe without psychotic features .   Suicidal/Homicidal: Nowithout intent/plan  Therapist Response:  Marveline met with clinician for an individual session. [name] discussed her psychiatric symptoms and her current life events. [name] shared that she had been in the hospital due to issues with her heart. She reported that she had stayed for several days. [name] discussed her thoughts and emotions about the stay. She shared that this exasperated her depression because of her constant concern about being able to pay her bills. She shared that she is living month to month and is behind. She shared that her health issues make it hard for her to get caught up. Client and clinician discussed some resources. [name] shared that she has added stress because she does not want to burden her children by letting them know about her current challenges. Clinician asked open ended questions. [name] had the insight that it would be better to let her sons know so that they could be supportive (if only emotionally) of her challenges. [name] and clinician discussed self care.  [name] shared that she had not completed her homework because she had not been feeling well. Client and clinician reviewed her homework, Client and clinician discussed the purpose of the homework. [name] agreed to continue her homework this week.  Plan: Return again in 1 weeks.  Diagnosis: Axis I: Major depressive disorder, recurrent, severe without psychotic features        Claris Pech A,  LCSW 11/02/2014

## 2014-11-08 ENCOUNTER — Other Ambulatory Visit: Payer: Self-pay | Admitting: *Deleted

## 2014-11-08 ENCOUNTER — Ambulatory Visit (HOSPITAL_COMMUNITY): Payer: Medicare Other | Attending: Cardiology | Admitting: Cardiology

## 2014-11-08 ENCOUNTER — Other Ambulatory Visit (INDEPENDENT_AMBULATORY_CARE_PROVIDER_SITE_OTHER): Payer: Medicare Other | Admitting: *Deleted

## 2014-11-08 ENCOUNTER — Encounter: Payer: Self-pay | Admitting: *Deleted

## 2014-11-08 ENCOUNTER — Encounter: Payer: Self-pay | Admitting: Cardiology

## 2014-11-08 ENCOUNTER — Ambulatory Visit (INDEPENDENT_AMBULATORY_CARE_PROVIDER_SITE_OTHER): Payer: Medicare Other | Admitting: Cardiology

## 2014-11-08 VITALS — BP 124/64 | HR 58 | Ht 64.0 in | Wt 151.0 lb

## 2014-11-08 DIAGNOSIS — E785 Hyperlipidemia, unspecified: Secondary | ICD-10-CM | POA: Diagnosis not present

## 2014-11-08 DIAGNOSIS — I10 Essential (primary) hypertension: Secondary | ICD-10-CM | POA: Diagnosis not present

## 2014-11-08 DIAGNOSIS — I5032 Chronic diastolic (congestive) heart failure: Secondary | ICD-10-CM

## 2014-11-08 DIAGNOSIS — I480 Paroxysmal atrial fibrillation: Secondary | ICD-10-CM

## 2014-11-08 DIAGNOSIS — R0602 Shortness of breath: Secondary | ICD-10-CM

## 2014-11-08 DIAGNOSIS — R748 Abnormal levels of other serum enzymes: Secondary | ICD-10-CM | POA: Diagnosis not present

## 2014-11-08 DIAGNOSIS — Z7901 Long term (current) use of anticoagulants: Secondary | ICD-10-CM

## 2014-11-08 DIAGNOSIS — I4891 Unspecified atrial fibrillation: Secondary | ICD-10-CM

## 2014-11-08 DIAGNOSIS — I483 Typical atrial flutter: Secondary | ICD-10-CM

## 2014-11-08 DIAGNOSIS — R9431 Abnormal electrocardiogram [ECG] [EKG]: Secondary | ICD-10-CM | POA: Diagnosis not present

## 2014-11-08 DIAGNOSIS — R7989 Other specified abnormal findings of blood chemistry: Secondary | ICD-10-CM

## 2014-11-08 DIAGNOSIS — I270 Primary pulmonary hypertension: Secondary | ICD-10-CM | POA: Diagnosis not present

## 2014-11-08 DIAGNOSIS — I272 Pulmonary hypertension, unspecified: Secondary | ICD-10-CM

## 2014-11-08 DIAGNOSIS — I952 Hypotension due to drugs: Secondary | ICD-10-CM

## 2014-11-08 DIAGNOSIS — I25810 Atherosclerosis of coronary artery bypass graft(s) without angina pectoris: Secondary | ICD-10-CM

## 2014-11-08 DIAGNOSIS — I5042 Chronic combined systolic (congestive) and diastolic (congestive) heart failure: Secondary | ICD-10-CM | POA: Diagnosis not present

## 2014-11-08 DIAGNOSIS — R0609 Other forms of dyspnea: Secondary | ICD-10-CM

## 2014-11-08 DIAGNOSIS — I69354 Hemiplegia and hemiparesis following cerebral infarction affecting left non-dominant side: Secondary | ICD-10-CM | POA: Diagnosis not present

## 2014-11-08 DIAGNOSIS — J45909 Unspecified asthma, uncomplicated: Secondary | ICD-10-CM | POA: Diagnosis not present

## 2014-11-08 DIAGNOSIS — R0600 Dyspnea, unspecified: Secondary | ICD-10-CM

## 2014-11-08 DIAGNOSIS — J449 Chronic obstructive pulmonary disease, unspecified: Secondary | ICD-10-CM | POA: Diagnosis not present

## 2014-11-08 DIAGNOSIS — N19 Unspecified kidney failure: Secondary | ICD-10-CM | POA: Diagnosis not present

## 2014-11-08 LAB — CBC WITH DIFFERENTIAL/PLATELET
Basophils Absolute: 0 10*3/uL (ref 0.0–0.1)
Basophils Relative: 0.5 % (ref 0.0–3.0)
Eosinophils Absolute: 0.2 10*3/uL (ref 0.0–0.7)
Eosinophils Relative: 3.3 % (ref 0.0–5.0)
HCT: 34.2 % — ABNORMAL LOW (ref 36.0–46.0)
Hemoglobin: 11.8 g/dL — ABNORMAL LOW (ref 12.0–15.0)
Lymphocytes Relative: 19.8 % (ref 12.0–46.0)
Lymphs Abs: 1 10*3/uL (ref 0.7–4.0)
MCHC: 34.5 g/dL (ref 30.0–36.0)
MCV: 92.7 fl (ref 78.0–100.0)
Monocytes Absolute: 0.5 10*3/uL (ref 0.1–1.0)
Monocytes Relative: 10.4 % (ref 3.0–12.0)
Neutro Abs: 3.5 10*3/uL (ref 1.4–7.7)
Neutrophils Relative %: 66 % (ref 43.0–77.0)
Platelets: 285 10*3/uL (ref 150.0–400.0)
RBC: 3.69 Mil/uL — ABNORMAL LOW (ref 3.87–5.11)
RDW: 13.1 % (ref 11.5–15.5)
WBC: 5.3 10*3/uL (ref 4.0–10.5)

## 2014-11-08 LAB — BASIC METABOLIC PANEL
BUN: 20 mg/dL (ref 6–23)
CO2: 29 mEq/L (ref 19–32)
Calcium: 9.2 mg/dL (ref 8.4–10.5)
Chloride: 105 mEq/L (ref 96–112)
Creatinine, Ser: 1.55 mg/dL — ABNORMAL HIGH (ref 0.40–1.20)
GFR: 36.62 mL/min — ABNORMAL LOW (ref >60.00)
Glucose, Bld: 94 mg/dL (ref 70–99)
Potassium: 3.2 mEq/L — ABNORMAL LOW (ref 3.5–5.1)
Sodium: 140 mEq/L (ref 135–145)

## 2014-11-08 LAB — BRAIN NATRIURETIC PEPTIDE: Pro B Natriuretic peptide (BNP): 89 pg/mL (ref 0.0–100.0)

## 2014-11-08 LAB — PROTIME-INR
INR: 1.2 ratio — ABNORMAL HIGH (ref 0.8–1.0)
Prothrombin Time: 12.9 s (ref 9.6–13.1)

## 2014-11-08 MED ORDER — TRAMADOL HCL 50 MG PO TABS: ORAL_TABLET | ORAL | Status: DC

## 2014-11-08 NOTE — Progress Notes (Signed)
Patient ID: [name], female   DOB: 1956/02/12, 59 y.o.   MRN: [id number]    Patient Name: [name] Date of Encounter: 11/08/2014  Primary Care Provider:  [name], MD Primary Cardiologist:  Lars Masson  Problem List   Past Medical History  Diagnosis Date  . CAD (coronary artery disease)     a.  cath 09/2010: LAD stent patent, S-Int/dCFX ok, S-PDA ok, L-LAD atretic;  b. Lexiscan Myoview (02/2014):  no ischemia, EF 55%   . Hypertension   . Hx of CABG   . Hx of transesophageal echocardiography (TEE) for monitoring 11/2010    TEE 11/2010: EF 60-65%, BAE, trivial atrial septal shunt;  right heart cath in 10/2010 with elevated R and L heart pressures and diuretic started  . HLD (hyperlipidemia)   . Hypothyroidism   . COPD (chronic obstructive pulmonary disease)   . Pulmonary nodules     repeat CT due in 11/2011  . Acute right MCA stroke 11/07/10  . 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 Eczema   . Depression   . Atrial fibrillation     a. s/p TEE-DCCV 02/2104; b. Xarelto started  . Cardiomyopathy with EF 40% at TEE 02/17/14 (likely tachycardia mediated - Myoview 02/19/14 neg for ischemia with normal EF) 02/18/2014  . 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 Sleep apnea     recent sleep study  in 04/2014 per chart review  shows no significant OSA  . Dysrhythmia 10/2014    atrial fib with rvr  . GERD (gastroesophageal reflux disease)   . Headache    Past Surgical History  Procedure Laterality Date  . Coronary artery bypass graft    . Debriment for infection in chest    . Chest wall reconstruction    . Bilateral knee surgery    . Bladder surgery    . Left mastoidectomy    . Hernia repair    . Cath 2012    . Tee without cardioversion N/A 02/17/2014    Procedure: TRANSESOPHAGEAL ECHOCARDIOGRAM (TEE);  Surgeon: Pricilla Riffle, MD;  Location: [facility] ENDOSCOPY;  Service: Cardiovascular;  Laterality: N/A;  . Cardioversion  N/A 02/17/2014    Procedure: CARDIOVERSION;  Surgeon: Pricilla Riffle, MD;  Location: [facility] ENDOSCOPY;  Service: Cardiovascular;  Laterality: N/A;    Allergies  Allergies  Allergen Reactions  . Amoxicillin Hives  . Avelox [Moxifloxacin Hcl In Nacl] Other (See Comments)    Mental breakdown.  [name] Hydrocodone Itching  . Penicillins Hives  . Sulfa Antibiotics Other (See Comments)    unknown    HPI  59 year old female with prior medical history of coronary artery disease, s/p CABG in 2008, s/p cath 09/2010: LAD stent patent, S-Int/dCFX ok, S-PDA ok, L-LAD atretic;   Lexiscan Myoview (02/2014) was normal, she also has h/o hypertension, hyperlipidemia, paroxysmal atrial fibrillation and atrial flutter on chronic anticoagulation with xarelto, s/p stroke.   The patient was followed in our clinic by Dr. Riley Kill until approximately 3 years ago when she had a stroke, became homeless and moved to [place].  She got back to [facility] about 1 year ago and was seen in urgent care for a spider bite which she was told by a physician that her heartbeat are irregular. The patient was admitted to the hospital and cardioverted on 02/17/2014, on chronic anticoagulation with Xarelto. She was found to have mildly  impaired left ventricle systolic function with LVEF 40%, that was believed to be secondary to a-fib with RVR and improved back to normal, most recent LVEF 65-70%. She was started on Xarelto.   She had another paroxysm of a-fib with RVR on 03/06/14 and was started on amiodarone.  At that time patients ECG was suspicious of anterolateral ischemia but she underwent nuclear Lexiscan stress test that was negative. Since then she has been complaining of SOB and was treated for volume overload with Lasix that was recently held because of worsening Creatinine.   Today she comes and states that her SOB is progressively worsening. She complains of dyspnea with minimal exertion despite loosing  11 pounds in the last 3 months.    She denies palpitations, chest pain, lower extremity edema   Home Medications  Prior to Admission medications   Medication Sig Start Date End Date Taking? Authorizing Provider  ABILIFY 10 MG tablet TAKE 1 TABLET (10 MG TOTAL) BY MOUTH DAILY. 11/29/13  Yes Elvina Sidle, MD  aspirin EC 81 MG tablet Take 1 tablet (81 mg total) by mouth daily. 10/28/13  Yes Phillips Odor, MD  atorvastatin (LIPITOR) 40 MG tablet Take 1 tablet (40 mg total) by mouth daily. 11/16/13  Yes Elvina Sidle, MD  clopidogrel (PLAVIX) 75 MG tablet Take 1 tablet (75 mg total) by mouth daily. 11/16/13  Yes Elvina Sidle, MD  DULoxetine (CYMBALTA) 30 MG capsule Take 2 capsules (60 mg total) by mouth 2 (two) times daily. 11/16/13  Yes Elvina Sidle, MD  lamoTRIgine (LAMICTAL) 100 MG tablet Take 1 tablet (100 mg total) by mouth daily. 11/16/13  Yes Elvina Sidle, MD  levothyroxine (SYNTHROID, LEVOTHROID) 125 MCG tablet Take 1 tablet (125 mcg total) by mouth daily. 11/16/13  Yes Elvina Sidle, MD  lisinopril (PRINIVIL,ZESTRIL) 20 MG tablet TAKE 1 TABLET BY MOUTH DAILY. 11/29/13  Yes Elvina Sidle, MD  topiramate (TOPAMAX) 50 MG tablet Take 1 tablet (50 mg total) by mouth 2 (two) times daily. Start one tablet at night x 1 week then twice daily 01/03/14  Yes Micki Riley, MD  traMADol (ULTRAM) 50 MG tablet Take 2 tablets (100 mg total) by mouth every 6 (six) hours as needed for moderate pain. 01/03/14  Yes Micki Riley, MD    Family History  Family History  Problem Relation Age of Onset  . COPD Mother   . Heart disease Mother   . Arthritis Mother     Rheumatoid and PMR  . Osteoporosis Mother     Mom fractured hip  . Diabetes type II Mother   . Depression Mother   . Heart attack Father   . Depression Father   . Hypertension Father   . Alcohol abuse Father   . Depression Sister   . Anxiety disorder Sister   . Drug abuse Sister     Social History  History   Social History  . Marital Status: Divorced     Spouse Name: N/A  . Number of Children: 2  . Years of Education: N/A   Occupational History  . DISABLE     Social History Main Topics  . Smoking status: Former Smoker -- 0.25 packs/day for 30 years    Types: Cigarettes    Quit date: 09/10/2014  . Smokeless tobacco: Never Used  . Alcohol Use: No  . Drug Use: No  . Sexual Activity: Not Currently   Other Topics Concern  . Not on file   Social History Narrative   Divorced  After stroke was living with sister and mother after sister asked her to leave. Mom has since deceased    Difficulties over control.    Then moved back in for about 6 weeks.   . tranportaion difficult son  helping   Ex-smoker   Was living at friends  Sister and her had argument and she was told to leave .    She was living in a homeless shelter for the last 3 weeks [facility]    son had help with transportation and medication       Work status regular  before got sick on short-term disability now lost t insurance after the stroke and couldn't work was  denied ssi    2 times.  She is not eligible for Medicaid because she doesn't have dependent children.         College graduate ;psychology [employer] graduated May 2011   Has children   Now on medicare /medicaid disability  So she has some acess to health services    Has a Designer, industrial/product and drives some .   Lives in  rented house 2 house mates males keep tp self has her own b room  Doing well      Review of Systems, as per HPI, otherwise negative General:  No chills, fever, night sweats or weight changes.  Cardiovascular:  No chest pain, dyspnea on exertion, edema, orthopnea, palpitations, paroxysmal nocturnal dyspnea. Dermatological: No rash, lesions/masses Respiratory: No cough, dyspnea Urologic: No hematuria, dysuria Abdominal:   No nausea, vomiting, diarrhea, bright red blood per rectum, melena, or hematemesis Neurologic:  No visual changes, wkns, changes in mental status. All other systems  reviewed and are otherwise negative except as noted above.  Physical Exam  Blood pressure 124/64, pulse 58, height  (1.626 m), weight 151 lb (68.493 kg).  General: Pleasant, NAD Psych: Normal affect. Neuro: Alert and oriented X 3. Moves all extremities spontaneously. HEENT: Normal  Neck: Supple without bruits or JVD. Lungs:  Resp regular and unlabored, CTA. Heart: RRR no s3, s4, or murmurs. Abdomen: Soft, non-tender, non-distended, BS + x 4.  Extremities: No clubbing, cyanosis or edema. DP/PT/Radials 2+ and equal bilaterally.  Labs:  No results for input(s): CKTOTAL, CKMB, TROPONINI in the last 72 hours. Lab Results  Component Value Date   WBC 3.5* 10/13/2014   HGB 9.6* 10/13/2014   HCT 27.3* 10/13/2014   MCV 90.1 10/13/2014   PLT 178 10/13/2014       Component Value Date/Time   NA 139 11/02/2014 1125   K 3.3* 11/02/2014 1125   CL 104 11/02/2014 1125   CO2 28 11/02/2014 1125   GLUCOSE 104* 11/02/2014 1125   BUN 22 11/02/2014 1125   CREATININE 1.37* 11/02/2014 1125   CREATININE 1.22* 05/15/2014 1442   CALCIUM 9.5 11/02/2014 1125   PROT 7.4 10/11/2014 0954   ALBUMIN 4.4 10/11/2014 0954   AST 18 10/11/2014 0954   ALT 9 10/11/2014 0954   ALKPHOS 85 10/11/2014 0954   BILITOT 0.6 10/11/2014 0954   GFRNONAA 52* 10/14/2014 0413   GFRNONAA 47* 05/11/2014 2059   GFRAA 60* 10/14/2014 0413   GFRAA 54* 05/11/2014 2059   Lab Results  Component Value Date   CHOL 237* 11/16/2013   HDL 68 11/16/2013   LDLCALC 152* 11/16/2013   TRIG 85 11/16/2013   Accessory Clinical Findings  Echocardiogram - 05/27/2014 Left ventricle: The cavity size was normal. Wall thickness was increased in a pattern of moderate LVH. Systolic function  was normal. The estimated ejection fraction was in the range of 55% to 60%. Wall motion was normal; there were no regional wall motion abnormalities. Features are consistent with a pseudonormal left ventricular filling pattern, with  concomitant abnormal relaxation and increased filling pressure (grade 2 diastolic dysfunction). - Left atrium: The atrium was mildly dilated.  Echocardiogram: 11/08/2014 Study Conclusions  - Left ventricle: The cavity size was normal. There was mild focal basal hypertrophy of the septum. Systolic function was vigorous. The estimated ejection fraction was in the range of 65% to 70%. There is hypokinesis of the basalinferior myocardium. Doppler parameters are consistent with abnormal left ventricular relaxation (grade 1 diastolic dysfunction). - Aortic valve: There was trivial regurgitation. - Left atrium: The atrium was mildly dilated.  Impressions: - Compared to the prior study, there has been no significant interval change.    Assessment & Plan  59 year old female  1. Dyspnea on exertion - progressively worsening - ECG shows negative T waves in the inferolateral leads that are unchanged from prior when she had negative stress test, however worsening symptoms despite optimal therapy and signs of CHF.  We will schedule for a left and right cardiac cath. We will start bicarb drip prior to the cath.   2. Chronic combined Systolic + Diastolic CHF (EF 10% in 02/2014): improved to 65-70% on echo today, only impaired relaxation with normal filling pressures, BNP today normal (previously  1600), Appears euvolemic on physical exam. Hold lasix.  3. CAD, S/P CABG and LAD stenting, continue ASA, atorvastatin, plavix, lisinopril.  4. Parox atrial flutter with variable block, currently in SR, on amiodarone.   5. Parox Atrial Fibrillation: s/p successful TEE/ DCCV back to NSR. On amiodarone and xarelto. CHADS-Vasc 5.   6. HTN: controlled.   8. HLD: Continue statin. She is on high dose Lipitor (80 mg). Last lipid panel was 11/16/13. LDL was not at goal, at 152 mg/dL. Goal in the setting of CAD is <70.   9. Hypokalemia - replace with KCl  Follow up after the cath.  Lars Masson, MD, [facility] 11/08/2014, 11:51 AM

## 2014-11-08 NOTE — Patient Instructions (Signed)
Your physician recommends that you continue on your current medications as directed. Please refer to the Current Medication list given to you today.    Your physician recommends that you return for lab work in: TODAY --PT/INR, BMET, BNP, CBC W DIFF--PRE CATH LABS   Your physician has requested that you have a cardiac catheterization. Cardiac catheterization is used to diagnose and/or treat various heart conditions. Doctors may recommend this procedure for a number of different reasons. The most common reason is to evaluate chest pain. Chest pain can be a symptom of coronary artery disease (CAD), and cardiac catheterization can show whether plaque is narrowing or blocking your heart's arteries. This procedure is also used to evaluate the valves, as well as measure the blood flow and oxygen levels in different parts of your heart. For further information please visit [email or web address] Please follow instruction sheet, as given.  YOUR CATH IS SCHEDULED FOR THIS Thursday 11/10/14 AT 1030 AM WITH DR COOPER---YOU MUST ARRIVE AT 8:30 AM AT [facility].   Your physician recommends that you schedule a follow-up appointment in: WITH DR NELSON BASED ON YOUR CATH RESULTS

## 2014-11-08 NOTE — Progress Notes (Signed)
Echo performed. 

## 2014-11-09 ENCOUNTER — Encounter (HOSPITAL_COMMUNITY): Payer: Self-pay | Admitting: Psychiatry

## 2014-11-09 ENCOUNTER — Telehealth: Payer: Self-pay | Admitting: *Deleted

## 2014-11-09 ENCOUNTER — Ambulatory Visit (INDEPENDENT_AMBULATORY_CARE_PROVIDER_SITE_OTHER): Payer: Medicare Other | Admitting: Psychiatry

## 2014-11-09 VITALS — BP 132/87 | HR 52 | Ht 64.0 in | Wt 154.0 lb

## 2014-11-09 DIAGNOSIS — F329 Major depressive disorder, single episode, unspecified: Secondary | ICD-10-CM | POA: Diagnosis not present

## 2014-11-09 DIAGNOSIS — F331 Major depressive disorder, recurrent, moderate: Secondary | ICD-10-CM

## 2014-11-09 MED ORDER — POTASSIUM CHLORIDE CRYS ER 20 MEQ PO TBCR: EXTENDED_RELEASE_TABLET | ORAL | Status: DC

## 2014-11-09 MED ORDER — BUPROPION HCL ER (XL) 300 MG PO TB24: 300.0000 mg | ORAL_TABLET | Freq: Every day | ORAL | Status: DC

## 2014-11-09 MED ORDER — LORAZEPAM 1 MG PO TABS: ORAL_TABLET | ORAL | Status: DC

## 2014-11-09 MED ORDER — TRAMADOL HCL 50 MG PO TABS: ORAL_TABLET | ORAL | Status: DC

## 2014-11-09 NOTE — Telephone Encounter (Signed)
Pt wanted to inform Dr Delton See that if she is discharged from her cath tomorrow with Dr Excell Seltzer, she will not have anyone to safely transport her home and stay with her 24 hours thereafter.  Pt thought that her son would be able to stay with her, but his place of work is requiring him to report to his job after transporting his mother home from the cath. Pt agreed to the cath date, time, and instruction letter which stated you must have transportation arranged on discharge and have someone stay with her 24 hours thereafter, at OV with Dr Delton See on 3/1.  Pt states that her son is her only resource. Pt states she has no one else to reach out to. Pt states he will be able to drive her home, but will not be able to stay with her 24 hours thereafter. Being Dr Delton See is out of the office today, informed the pt that I will notify Dr Excell Seltzer of unsafe discharge, being he will perform her cath tomorrow 3/3 at 1030. Paged Dr Excell Seltzer at the hospital to inform him that he will be performing her cath tomorrow 3/3 at 1030, and Dr Randolm Idol assist answered the page.  Informed Dr Randolm Idol assist that the pt she will have an unsafe discharge if her cath is deemed normal.  Informed Dr Earmon Phoenix assistant that she will need to be admitted overnight, because the pt will have no one to stay in the home with her 24 hours after cath discharge.  Dr Earmon Phoenix assist verbalized understanding and state's he will endorse this information to him.  Pt is aware of this and agrees with this plan.

## 2014-11-09 NOTE — Telephone Encounter (Signed)
Noted in pts chart that Dr Excell Seltzer is now off of the pts cath scheduled for 3/3 at 1030, due to inaccurate scheduling issues.  Dr Herbie Baltimore will be performing the pts cath per [facility].  Informed Trish that the pt will need arrangements to be admitted to the hospital after the cath due to unsafe discharge circumstances.  Informed Trish that the pt has nobody to stay home with her 24 hours after being discharged from her cath.  Per Rosann Auerbach she states that the arrangements will be made for the pt to be admitted for unsafe discharge reasons.

## 2014-11-09 NOTE — Telephone Encounter (Signed)
Contacted the pt that per Dr Delton See based on her pre-procedure lab results her K was low at 3.2.  Informed the pt that Dr Delton See reviewed her labs on 3/1, and recommends for her to start taking KCL 40 mEq x 1 day and then 20 mEq for the next week, then d/c after that.  Noted that pt is currently taking KCL 20 mEq tablets and was instructed on 2/24 to take 3 tabs to total 60 mEq daily until pts next OV with Dr Delton See on 3/1. This was based on an abnormal bmet with a K of 3.3. Pt verbalized she is taking KCL 60 mEq daily as she was instructed to.  Informed the pt that Dr Delton See is out of the office today, but I will go to our DOD Dr Patty Sermons for further review and recommendations and follow-up with her there after.  Pt verbalized understanding and agrees with this plan.

## 2014-11-09 NOTE — Progress Notes (Addendum)
[facility] MD Progress Note  11/09/2014 3:56 PM [name]  MRN:  [id number] Subjective: Fragile The patient says that she medically is not been well. She went into atrial fibrillation. Her arrhythmia was corrected with medications but she has a cardiac catheterization scheduled for tomorrow morning. It is noted this patient has had bypass surgery in the past. The patient is handling it fairly well she denies daily depression. She does acknowledge she's very anxious these days. This is not like her last visit. At this time she is having a hard time falling asleep and staying asleep. Her energy clearly is impacted likely by the fact that she's not sleeping well. She's having difficulty concentrating. The patient generally enjoys reading but is unable to write these days. She does get on the computer. The relationship with a man that she had last time has changed to a friendship. The patient still is contact with her son on a daily basis. The patient present is on disability. On her last visit we reduced her Abilify from 5 mg down to 2.5 mg without really any affects. The patient does not drink any alcohol and uses no drugs. Generally she is fairly stable. At a guitar for her to deal with his atrial fibrillation and in fact she'll need to be hospitalized for couple of days afterwards. The patient clearly is distressed from her physical status. Principal Problem:Major Deprssion Diagnosis:   Patient Active Problem List   Diagnosis Date Noted  . SOB (shortness of breath) [R06.02] 11/08/2014  . Renal insufficiency [N28.9] 10/11/2014  . A-fib [I48.91] 10/11/2014  . Abnormal facial hair [L68.2] 05/22/2014  . Hypokalemia [E87.6] 05/22/2014  . History of stroke with residual deficit [I69.30] 05/22/2014  . History of recent fall [Z91.81] 05/22/2014  . Somnolence [R40.0] 05/22/2014  . Creatinine elevation [R74.8] 05/22/2014  . Excessive daytime sleepiness [G47.19] 03/28/2014  . Mood disorder [F39] 03/20/2014  .  High risk medication use [Z79.899] 03/20/2014  . HTN (hypertension) [I10] 03/20/2014  . Anemia, unspecified [D64.9] 03/20/2014  . Tobacco use disorder [Z72.0] 03/20/2014  . Atrial flutter [I48.92] 03/10/2014  . Atrial fibrillation with RVR [I48.91] 03/03/2014  . AKI (acute kidney injury) [N17.9] 03/03/2014  . Hypotension [I95.9] 03/03/2014  . Pulmonary hypertension [I27.0] 03/03/2014  . COPD (chronic obstructive pulmonary disease) [J44.9] 03/03/2014  . History of right MCA stroke [Z86.73]   . Long-term (current) use of anticoagulants [Z79.01]   . Cardiomyopathy with EF 40% at TEE 02/17/14 (likely tachycardia mediated - Myoview 02/19/14 neg for ischemia with normal EF) [I42.9] 02/18/2014  . Hyperlipidemia [E78.5] 02/18/2014  . Coronary artery disease s/p CABG and prior PCI to LAD [I25.10] 02/16/2014  . Homelessness [Z59.0] 11/12/2011  . Chronic diastolic heart failure [I50.32]   . Pulmonary nodules [R91.8]   . ASTHMA [J45.909] 10/28/2006  . Hypothyroidism [E03.9]   . Obstructive sleep apnea [G47.33]    Total Time spent with patient: 30 minutes   Past Medical History:  Past Medical History  Diagnosis Date  . CAD (coronary artery disease)     a.  cath 09/2010: LAD stent patent, S-Int/dCFX ok, S-PDA ok, L-LAD atretic;  b. Lexiscan Myoview (02/2014):  no ischemia, EF 55%   . Hypertension   . Hx of CABG   . Hx of transesophageal echocardiography (TEE) for monitoring 11/2010    TEE 11/2010: EF 60-65%, BAE, trivial atrial septal shunt;  right heart cath in 10/2010 with elevated R and L heart pressures and diuretic started  . HLD (hyperlipidemia)   .  Hypothyroidism   . COPD (chronic obstructive pulmonary disease)   . Pulmonary nodules     repeat CT due in 11/2011  . Acute right MCA stroke 11/07/10  . 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 Eczema   . Depression   . Atrial fibrillation     a. s/p TEE-DCCV 02/2104; b. Xarelto started  . Cardiomyopathy with EF 40% at TEE  02/17/14 (likely tachycardia mediated - Myoview 02/19/14 neg for ischemia with normal EF) 02/18/2014  . 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 Sleep apnea     recent sleep study  in 04/2014 per chart review  shows no significant OSA  . Dysrhythmia 10/2014    atrial fib with rvr  . GERD (gastroesophageal reflux disease)   . Headache     Past Surgical History  Procedure Laterality Date  . Coronary artery bypass graft    . Debriment for infection in chest    . Chest wall reconstruction    . Bilateral knee surgery    . Bladder surgery    . Left mastoidectomy    . Hernia repair    . Cath 2012    . Tee without cardioversion N/A 02/17/2014    Procedure: TRANSESOPHAGEAL ECHOCARDIOGRAM (TEE);  Surgeon: Pricilla Riffle, MD;  Location: [facility] ENDOSCOPY;  Service: Cardiovascular;  Laterality: N/A;  . Cardioversion N/A 02/17/2014    Procedure: CARDIOVERSION;  Surgeon: Pricilla Riffle, MD;  Location: [facility] ENDOSCOPY;  Service: Cardiovascular;  Laterality: N/A;   Family History:  Family History  Problem Relation Age of Onset  . COPD Mother   . Heart disease Mother   . Arthritis Mother     Rheumatoid and PMR  . Osteoporosis Mother     Mom fractured hip  . Diabetes type II Mother   . Depression Mother   . Heart attack Father   . Depression Father   . Hypertension Father   . Alcohol abuse Father   . Depression Sister   . Anxiety disorder Sister   . Drug abuse Sister    Social History:  History  Alcohol Use No     History  Drug Use No    History   Social History  . Marital Status: Divorced    Spouse Name: N/A  . Number of Children: 2  . Years of Education: N/A   Occupational History  . DISABLE     Social History Main Topics  . Smoking status: Former Smoker -- 0.25 packs/day for 30 years    Types: Cigarettes    Quit date: 09/10/2014  . Smokeless tobacco: Never Used  . Alcohol Use: No  . Drug Use: No  . Sexual Activity: Not Currently   Other Topics  Concern  . None   Social History Narrative   Divorced   After stroke was living with sister and mother after sister asked her to leave. Mom has since deceased    Difficulties over control.    Then moved back in for about 6 weeks.   . tranportaion difficult son  helping   Ex-smoker   Was living at friends  Sister and her had argument and she was told to leave .    She was living in a homeless shelter for the last 3 weeks [facility]    son had help with transportation and medication       Work status regular  before got sick on short-term disability now lost t insurance after the stroke and couldn't work was  denied ssi    2 times.  She is not eligible for Medicaid because she doesn't have dependent children.         College graduate ;psychology [employer] graduated May 2011   Has children   Now on medicare /medicaid disability  So she has some acess to health services    Has a Designer, industrial/product and drives some .   Lives in  rented house 2 house mates males keep tp self has her own b room  Doing well    Additional History:    Sleep: Poor  Appetite:  Fair   Assessment:   Musculoskeletal: Strength & Muscle Tone:  Gait & Station:  Patient leans:    Psychiatric Specialty Exam: Physical Exam  ROS  Blood pressure 132/87, pulse 52, height  (1.626 m), weight 154 lb (69.854 kg).Body mass index is 26.42 kg/(m^2).  General Appearance: Fairly Groomed  Patent attorney::  Good  Speech:  Clear and Coherent  Volume:  Normal  Mood:  Anxious  Affect:  Appropriate  Thought Process:  Coherent  Orientation:  Full (Time, Place, and Person)  Thought Content:  WDL  Suicidal Thoughts:  No  Homicidal Thoughts:  No  Memory:  NA  Judgement:  Fair  Insight:  Good  Psychomotor Activity:  Normal  Concentration:  Fair  Recall:  Fair  Fund of Knowledge:Good  Language: Good  Akathisia:  No  Handed:  Right  AIMS (if indicated):     Assets:  Communication Skills  ADL's:  Intact  Cognition:    Sleep:        Current Medications: Current Outpatient Prescriptions  Medication Sig Dispense Refill  . albuterol (PROVENTIL HFA;VENTOLIN HFA) 108 (90 BASE) MCG/ACT inhaler Inhale 2 puffs into the lungs every 6 (six) hours as needed for wheezing or shortness of breath. 1 Inhaler 2  . amiodarone (PACERONE) 400 MG tablet Take 1 tablet (400 mg total) by mouth daily. 30 tablet 0  . atorvastatin (LIPITOR) 80 MG tablet Take 0.5 tablets (40 mg total) by mouth daily. 30 tablet 11  . buPROPion (WELLBUTRIN XL) 300 MG 24 hr tablet Take 1 tablet (300 mg total) by mouth daily. 30 tablet 2  . feeding supplement, ENSURE COMPLETE, (ENSURE COMPLETE) LIQD Take 237 mLs by mouth 3 (three) times daily between meals. 30 Bottle 0  . ferrous sulfate 325 (65 FE) MG tablet Take 1 tablet (325 mg total) by mouth 2 (two) times daily with a meal. (Patient taking differently: Take 325 mg by mouth daily with breakfast. ) 60 tablet 3  . furosemide (LASIX) 40 MG tablet Take 1 tablet (40 mg total) by mouth daily. 90 tablet 0  . levothyroxine (SYNTHROID, LEVOTHROID) 125 MCG tablet Take 1 tablet (125 mcg total) by mouth daily. 30 tablet 11  . LORazepam (ATIVAN) 1 MG tablet Half qam  1  qhs 45 tablet 2  . potassium chloride SA (K-DUR,KLOR-CON) 20 MEQ tablet Take 1 tablet (20 mEq) by mouth 4 times a day to total 80 mEq daily. 180 tablet   . rivaroxaban (XARELTO) 20 MG TABS tablet Take 1 tablet (20 mg total) by mouth daily with supper. 30 tablet 10  . tamsulosin (FLOMAX) 0.4 MG CAPS capsule Take 1 capsule (0.4 mg total) by mouth daily. 30 capsule 0  . topiramate (TOPAMAX) 50 MG tablet  in am and  at night 100 tablet 3  .  traMADol (ULTRAM) 50 MG tablet TAKE 2 TABLETS BY MOUTH EVERY 6 HOURS AS NEEDED FOR MODERATE PAIN 30 tablet 5  . vitamin B-12 1000 MCG tablet Take 1 tablet (1,000 mcg total) by mouth daily. 7 tablet 0   No current facility-administered medications for this visit.    Lab Results:  Results for orders  placed or performed in visit on 11/08/14 (from the past 48 hour(s))  INR/PT     Status: Abnormal   Collection Time: 11/08/14 12:47 PM  Result Value Ref Range   INR 1.2 (H) 0.8 - 1.0 ratio   Prothrombin Time 12.9 9.6 - 13.1 sec  Basic Metabolic Panel (BMET)     Status: Abnormal   Collection Time: 11/08/14 12:47 PM  Result Value Ref Range   Sodium 140 135 - 145 mEq/L   Potassium 3.2 (L) 3.5 - 5.1 mEq/L   Chloride 105 96 - 112 mEq/L   CO2 29 19 - 32 mEq/L   Glucose, Bld 94 70 - 99 mg/dL   BUN 20 6 - 23 mg/dL   Creatinine, Ser 7.90 (H) 0.40 - 1.20 mg/dL   Calcium 9.2 8.4 - 38.3 mg/dL   GFR 33.83 (L) >29.19 mL/min  CBC w/Diff     Status: Abnormal   Collection Time: 11/08/14 12:47 PM  Result Value Ref Range   WBC 5.3 4.0 - 10.5 K/uL   RBC 3.69 (L) 3.87 - 5.11 Mil/uL   Hemoglobin 11.8 (L) 12.0 - 15.0 g/dL   HCT 16.6 (L) 06.0 - 04.5 %   MCV 92.7 78.0 - 100.0 fl   MCHC 34.5 30.0 - 36.0 g/dL   RDW 99.7 74.1 - 42.3 %   Platelets 285.0 150.0 - 400.0 K/uL   Neutrophils Relative % 66.0 43.0 - 77.0 %   Lymphocytes Relative 19.8 12.0 - 46.0 %   Monocytes Relative 10.4 3.0 - 12.0 %   Eosinophils Relative 3.3 0.0 - 5.0 %   Basophils Relative 0.5 0.0 - 3.0 %   Neutro Abs 3.5 1.4 - 7.7 K/uL   Lymphs Abs 1.0 0.7 - 4.0 K/uL   Monocytes Absolute 0.5 0.1 - 1.0 K/uL   Eosinophils Absolute 0.2 0.0 - 0.7 K/uL   Basophils Absolute 0.0 0.0 - 0.1 K/uL  B Nat Peptide     Status: None   Collection Time: 11/08/14 12:47 PM  Result Value Ref Range   Pro B Natriuretic peptide (BNP) 89.0 0.0 - 100.0 pg/mL    Physical Findings: AIMS:  , ,  ,  ,    CIWA:    COWS:     Treatment Plan Summary: At this time this patient will discontinue all her Abilify. Was difficult for this patient to afford it and this is a big issue in terms of financial stresses. The patient is going to be hospitalized tomorrow for cardiac catheterization. She's very anxious about this and cannot sleep. Today we'll go ahead and start  her on a low-dose of Ativan taking 1 mg half in the morning and one at night. The patient continue taking Wellbutrin 300 mg. We have successfully discontinued her Lamictal and now her Abilify without any real emotional problems. This patient will call me if there are any changes. This patient is not suicidal. At this time she denies any chronic shortness of breath or chest pain. This patient she'll return to see me in 4 weeks.   Medical Decision Making:  Established Problem, Stable/Improving (1)     Keontay Vora IRVING 11/09/2014, 3:56 PM

## 2014-11-09 NOTE — Telephone Encounter (Signed)
Went to Dr Patty Sermons DOD over the pts lab results from 11/08/14 and to inform him that the pt is currently taking 60 mEq of KCL daily, as instructed by Dr Delton See based on 11/02/14 abnormal lab results.  Informed Dr Patty Sermons that the pts current K is now at 3.2.  Per Dr Patty Sermons the pt should increase her KCL to 20 mEq po 4 times a day to total 80 mEq and have her bmet rechecked tomorrow inpatient prior to cath at 1030.  Arrangements made for bmet to be drawn tomorrow prior to cath and pt is aware to take 4 tablets of KCL today, for she takes 20 mEq tablets.  Pt verbalized understanding of instructions given with verbal read back that she should increase her KCL to 80 mEq.  Will route this message to Dr Delton See for further review of message.

## 2014-11-09 NOTE — Telephone Encounter (Signed)
-----   Message from Lars Masson, MD sent at 11/08/2014  4:59 PM EST ----- Please start her on KCl, to take 40 mEq x 1 today and then 20 meQ for the next week. Then she can discontinue

## 2014-11-10 ENCOUNTER — Encounter (HOSPITAL_COMMUNITY)
Admission: RE | Disposition: A | Discharge status: Home / Self Care | Payer: Medicare Other | Source: Ambulatory Visit | Attending: Cardiology

## 2014-11-10 ENCOUNTER — Encounter (HOSPITAL_COMMUNITY): Payer: Self-pay | Admitting: Cardiology

## 2014-11-10 ENCOUNTER — Ambulatory Visit (HOSPITAL_COMMUNITY)
Admission: RE | Admit: 2014-11-10 | Discharge: 2014-11-11 | Disposition: A | Discharge status: Home / Self Care | Payer: Medicare Other | Source: Ambulatory Visit | Attending: Cardiology | Admitting: Cardiology

## 2014-11-10 DIAGNOSIS — I480 Paroxysmal atrial fibrillation: Secondary | ICD-10-CM | POA: Diagnosis not present

## 2014-11-10 DIAGNOSIS — I25118 Atherosclerotic heart disease of native coronary artery with other forms of angina pectoris: Secondary | ICD-10-CM | POA: Diagnosis not present

## 2014-11-10 DIAGNOSIS — Z8673 Personal history of transient ischemic attack (TIA), and cerebral infarction without residual deficits: Secondary | ICD-10-CM | POA: Insufficient documentation

## 2014-11-10 DIAGNOSIS — Z87891 Personal history of nicotine dependence: Secondary | ICD-10-CM | POA: Diagnosis not present

## 2014-11-10 DIAGNOSIS — E039 Hypothyroidism, unspecified: Secondary | ICD-10-CM | POA: Diagnosis not present

## 2014-11-10 DIAGNOSIS — F329 Major depressive disorder, single episode, unspecified: Secondary | ICD-10-CM | POA: Diagnosis not present

## 2014-11-10 DIAGNOSIS — Z7982 Long term (current) use of aspirin: Secondary | ICD-10-CM | POA: Insufficient documentation

## 2014-11-10 DIAGNOSIS — E785 Hyperlipidemia, unspecified: Secondary | ICD-10-CM | POA: Insufficient documentation

## 2014-11-10 DIAGNOSIS — I429 Cardiomyopathy, unspecified: Secondary | ICD-10-CM | POA: Diagnosis not present

## 2014-11-10 DIAGNOSIS — K219 Gastro-esophageal reflux disease without esophagitis: Secondary | ICD-10-CM | POA: Insufficient documentation

## 2014-11-10 DIAGNOSIS — I129 Hypertensive chronic kidney disease with stage 1 through stage 4 chronic kidney disease, or unspecified chronic kidney disease: Secondary | ICD-10-CM | POA: Insufficient documentation

## 2014-11-10 DIAGNOSIS — Z9861 Coronary angioplasty status: Secondary | ICD-10-CM

## 2014-11-10 DIAGNOSIS — I25719 Atherosclerosis of autologous vein coronary artery bypass graft(s) with unspecified angina pectoris: Secondary | ICD-10-CM | POA: Insufficient documentation

## 2014-11-10 DIAGNOSIS — I4892 Unspecified atrial flutter: Secondary | ICD-10-CM | POA: Insufficient documentation

## 2014-11-10 DIAGNOSIS — Z7901 Long term (current) use of anticoagulants: Secondary | ICD-10-CM | POA: Insufficient documentation

## 2014-11-10 DIAGNOSIS — I200 Unstable angina: Secondary | ICD-10-CM | POA: Diagnosis present

## 2014-11-10 DIAGNOSIS — G4733 Obstructive sleep apnea (adult) (pediatric): Secondary | ICD-10-CM | POA: Diagnosis not present

## 2014-11-10 DIAGNOSIS — I25119 Atherosclerotic heart disease of native coronary artery with unspecified angina pectoris: Secondary | ICD-10-CM | POA: Insufficient documentation

## 2014-11-10 DIAGNOSIS — I5032 Chronic diastolic (congestive) heart failure: Secondary | ICD-10-CM | POA: Diagnosis not present

## 2014-11-10 DIAGNOSIS — I209 Angina pectoris, unspecified: Secondary | ICD-10-CM | POA: Insufficient documentation

## 2014-11-10 DIAGNOSIS — I10 Essential (primary) hypertension: Secondary | ICD-10-CM | POA: Diagnosis present

## 2014-11-10 DIAGNOSIS — N183 Chronic kidney disease, stage 3 unspecified: Secondary | ICD-10-CM

## 2014-11-10 DIAGNOSIS — I2510 Atherosclerotic heart disease of native coronary artery without angina pectoris: Secondary | ICD-10-CM | POA: Diagnosis present

## 2014-11-10 DIAGNOSIS — J449 Chronic obstructive pulmonary disease, unspecified: Secondary | ICD-10-CM | POA: Diagnosis not present

## 2014-11-10 DIAGNOSIS — Z79899 Other long term (current) drug therapy: Secondary | ICD-10-CM | POA: Diagnosis not present

## 2014-11-10 DIAGNOSIS — Z955 Presence of coronary angioplasty implant and graft: Secondary | ICD-10-CM | POA: Insufficient documentation

## 2014-11-10 DIAGNOSIS — I25810 Atherosclerosis of coronary artery bypass graft(s) without angina pectoris: Secondary | ICD-10-CM

## 2014-11-10 HISTORY — PX: LEFT AND RIGHT HEART CATHETERIZATION WITH CORONARY ANGIOGRAM: SHX5449

## 2014-11-10 LAB — POCT I-STAT 3, VENOUS BLOOD GAS (G3P V)
ACID-BASE DEFICIT: 5 mmol/L — AB (ref 0.0–2.0)
Bicarbonate: 20.6 mEq/L (ref 20.0–24.0)
O2 SAT: 62 %
TCO2: 22 mmol/L (ref 0–100)
pCO2, Ven: 37.2 mmHg — ABNORMAL LOW (ref 45.0–50.0)
pH, Ven: 7.351 — ABNORMAL HIGH (ref 7.250–7.300)
pO2, Ven: 33 mmHg (ref 30.0–45.0)

## 2014-11-10 LAB — POCT I-STAT 3, ART BLOOD GAS (G3+)
Acid-base deficit: 5 mmol/L — ABNORMAL HIGH (ref 0.0–2.0)
BICARBONATE: 19.7 meq/L — AB (ref 20.0–24.0)
O2 Saturation: 95 %
PCO2 ART: 34.3 mmHg — AB (ref 35.0–45.0)
PH ART: 7.367 (ref 7.350–7.450)
PO2 ART: 77 mmHg — AB (ref 80.0–100.0)
TCO2: 21 mmol/L (ref 0–100)

## 2014-11-10 LAB — BASIC METABOLIC PANEL
ANION GAP: 5 (ref 5–15)
BUN: 16 mg/dL (ref 6–23)
CALCIUM: 9.2 mg/dL (ref 8.4–10.5)
CHLORIDE: 111 mmol/L (ref 96–112)
CO2: 22 mmol/L (ref 19–32)
CREATININE: 1.48 mg/dL — AB (ref 0.50–1.10)
GFR, EST AFRICAN AMERICAN: 44 mL/min — AB (ref >90)
GFR, EST NON AFRICAN AMERICAN: 38 mL/min — AB (ref >90)
Glucose, Bld: 85 mg/dL (ref 70–99)
POTASSIUM: 3.6 mmol/L (ref 3.5–5.1)
Sodium: 138 mmol/L (ref 135–145)

## 2014-11-10 LAB — POCT ACTIVATED CLOTTING TIME: Activated Clotting Time: 509 seconds

## 2014-11-10 SURGERY — LEFT AND RIGHT HEART CATHETERIZATION WITH CORONARY ANGIOGRAM

## 2014-11-10 MED ORDER — ALBUTEROL SULFATE (2.5 MG/3ML) 0.083% IN NEBU: 2.5000 mg | INHALATION_SOLUTION | Freq: Four times a day (QID) | RESPIRATORY_TRACT | Status: DC | PRN

## 2014-11-10 MED ORDER — MIDAZOLAM HCL 2 MG/2ML IJ SOLN
INTRAMUSCULAR | Status: AC
  Filled 2014-11-10: qty 2

## 2014-11-10 MED ORDER — FENTANYL CITRATE 0.05 MG/ML IJ SOLN
INTRAMUSCULAR | Status: AC
  Filled 2014-11-10: qty 2

## 2014-11-10 MED ORDER — ATORVASTATIN CALCIUM 40 MG PO TABS
40.0000 mg | ORAL_TABLET | Freq: Every day | ORAL | Status: DC
  Administered 2014-11-10 – 2014-11-11 (×2): 40 mg via ORAL
  Filled 2014-11-10 (×2): qty 1

## 2014-11-10 MED ORDER — SODIUM BICARBONATE BOLUS VIA INFUSION: INTRAVENOUS | Status: DC

## 2014-11-10 MED ORDER — TAMSULOSIN HCL 0.4 MG PO CAPS
0.4000 mg | ORAL_CAPSULE | Freq: Every day | ORAL | Status: DC
  Administered 2014-11-11: 0.4 mg via ORAL
  Filled 2014-11-10: qty 1

## 2014-11-10 MED ORDER — TRAMADOL HCL 50 MG PO TABS
50.0000 mg | ORAL_TABLET | Freq: Four times a day (QID) | ORAL | Status: DC | PRN
  Administered 2014-11-10 – 2014-11-11 (×3): 50 mg via ORAL
  Filled 2014-11-10 (×3): qty 1

## 2014-11-10 MED ORDER — ASPIRIN 81 MG PO CHEW
81.0000 mg | CHEWABLE_TABLET | ORAL | Status: AC
  Administered 2014-11-10: 81 mg via ORAL

## 2014-11-10 MED ORDER — SODIUM BICARBONATE BOLUS VIA INFUSION
INTRAVENOUS | Status: DC
  Filled 2014-11-10: qty 1

## 2014-11-10 MED ORDER — SODIUM CHLORIDE 0.9 % IJ SOLN: 3.0000 mL | Freq: Two times a day (BID) | INTRAMUSCULAR | Status: DC

## 2014-11-10 MED ORDER — ENSURE COMPLETE PO LIQD
237.0000 mL | Freq: Three times a day (TID) | ORAL | Status: DC
  Filled 2014-11-10 (×6): qty 237

## 2014-11-10 MED ORDER — ACETAMINOPHEN 325 MG PO TABS: 650.0000 mg | ORAL_TABLET | ORAL | Status: DC | PRN

## 2014-11-10 MED ORDER — HEPARIN (PORCINE) IN NACL 2-0.9 UNIT/ML-% IJ SOLN
INTRAMUSCULAR | Status: AC
  Filled 2014-11-10: qty 1000

## 2014-11-10 MED ORDER — VITAMIN B-12 1000 MCG PO TABS
1000.0000 ug | ORAL_TABLET | Freq: Every day | ORAL | Status: DC
  Administered 2014-11-10 – 2014-11-11 (×2): 1000 ug via ORAL
  Filled 2014-11-10 (×2): qty 1

## 2014-11-10 MED ORDER — SODIUM CHLORIDE 0.9 % IV SOLN
0.2500 mg/kg/h | INTRAVENOUS | Status: DC
  Administered 2014-11-10: 0.25 mg/kg/h via INTRAVENOUS
  Filled 2014-11-10: qty 250

## 2014-11-10 MED ORDER — FENTANYL CITRATE 0.05 MG/ML IJ SOLN
INTRAMUSCULAR | Status: AC
  Administered 2014-11-10: 25 ug via INTRAVENOUS
  Filled 2014-11-10: qty 2

## 2014-11-10 MED ORDER — SODIUM CHLORIDE 0.9 % IJ SOLN: 3.0000 mL | INTRAMUSCULAR | Status: DC | PRN

## 2014-11-10 MED ORDER — HEPARIN (PORCINE) IN NACL 2-0.9 UNIT/ML-% IJ SOLN
INTRAMUSCULAR | Status: AC
  Filled 2014-11-10: qty 500

## 2014-11-10 MED ORDER — RIVAROXABAN 20 MG PO TABS
20.0000 mg | ORAL_TABLET | Freq: Every day | ORAL | Status: DC
  Administered 2014-11-10 – 2014-11-11 (×2): 20 mg via ORAL
  Filled 2014-11-10 (×2): qty 1

## 2014-11-10 MED ORDER — AMIODARONE HCL 200 MG PO TABS
400.0000 mg | ORAL_TABLET | Freq: Every day | ORAL | Status: DC
Start: 2014-11-10 — End: 2014-11-11
  Administered 2014-11-10 – 2014-11-11 (×2): 400 mg via ORAL
  Filled 2014-11-10 (×2): qty 2

## 2014-11-10 MED ORDER — FENTANYL CITRATE 0.05 MG/ML IJ SOLN
25.0000 ug | INTRAMUSCULAR | Status: DC | PRN
  Administered 2014-11-10 (×2): 25 ug via INTRAVENOUS
  Filled 2014-11-10 (×2): qty 2

## 2014-11-10 MED ORDER — ASPIRIN 81 MG PO CHEW
CHEWABLE_TABLET | ORAL | Status: AC
  Filled 2014-11-10: qty 1

## 2014-11-10 MED ORDER — SODIUM CHLORIDE 0.9 % IV SOLN: 250.0000 mL | INTRAVENOUS | Status: DC | PRN

## 2014-11-10 MED ORDER — CLOPIDOGREL BISULFATE 75 MG PO TABS
75.0000 mg | ORAL_TABLET | Freq: Every day | ORAL | Status: DC
  Administered 2014-11-11: 75 mg via ORAL
  Filled 2014-11-10: qty 1

## 2014-11-10 MED ORDER — NITROGLYCERIN 1 MG/10 ML FOR IR/CATH LAB
INTRA_ARTERIAL | Status: AC
  Filled 2014-11-10: qty 10

## 2014-11-10 MED ORDER — BIVALIRUDIN 250 MG IV SOLR
INTRAVENOUS | Status: AC
  Filled 2014-11-10: qty 250

## 2014-11-10 MED ORDER — CLOPIDOGREL BISULFATE 300 MG PO TABS
ORAL_TABLET | ORAL | Status: AC
  Filled 2014-11-10: qty 2

## 2014-11-10 MED ORDER — ALBUTEROL SULFATE HFA 108 (90 BASE) MCG/ACT IN AERS: 2.0000 | INHALATION_SPRAY | Freq: Four times a day (QID) | RESPIRATORY_TRACT | Status: DC | PRN

## 2014-11-10 MED ORDER — POTASSIUM CHLORIDE CRYS ER 20 MEQ PO TBCR
20.0000 meq | EXTENDED_RELEASE_TABLET | Freq: Every day | ORAL | Status: DC
  Administered 2014-11-10 – 2014-11-11 (×2): 20 meq via ORAL
  Filled 2014-11-10 (×2): qty 1

## 2014-11-10 MED ORDER — DEXTROSE 5 % IV SOLN
INTRAVENOUS | Status: DC
  Filled 2014-11-10: qty 1000

## 2014-11-10 MED ORDER — TOPIRAMATE 25 MG PO TABS
50.0000 mg | ORAL_TABLET | Freq: Every evening | ORAL | Status: DC
  Administered 2014-11-10 – 2014-11-11 (×2): 50 mg via ORAL
  Filled 2014-11-10 (×2): qty 2

## 2014-11-10 MED ORDER — LEVOTHYROXINE SODIUM 125 MCG PO TABS
125.0000 ug | ORAL_TABLET | Freq: Every day | ORAL | Status: DC
  Administered 2014-11-11: 125 ug via ORAL
  Filled 2014-11-10 (×2): qty 1

## 2014-11-10 MED ORDER — ONDANSETRON HCL 4 MG/2ML IJ SOLN: 4.0000 mg | Freq: Four times a day (QID) | INTRAMUSCULAR | Status: DC | PRN

## 2014-11-10 MED ORDER — TOPIRAMATE 25 MG PO TABS
50.0000 mg | ORAL_TABLET | Freq: Two times a day (BID) | ORAL | Status: DC
  Administered 2014-11-11: 11:00:00 50 mg via ORAL
  Filled 2014-11-10 (×3): qty 2

## 2014-11-10 MED ORDER — FUROSEMIDE 40 MG PO TABS
40.0000 mg | ORAL_TABLET | Freq: Every day | ORAL | Status: DC
  Administered 2014-11-11: 40 mg via ORAL
  Filled 2014-11-10 (×2): qty 1

## 2014-11-10 MED ORDER — BUPROPION HCL ER (XL) 300 MG PO TB24
300.0000 mg | ORAL_TABLET | Freq: Every day | ORAL | Status: DC
  Administered 2014-11-11: 300 mg via ORAL
  Filled 2014-11-10 (×2): qty 1

## 2014-11-10 MED ORDER — DEXTROSE 5 % IV SOLN
INTRAVENOUS | Status: AC
  Administered 2014-11-10: 10:00:00 via INTRAVENOUS
  Filled 2014-11-10: qty 1000

## 2014-11-10 MED ORDER — LIDOCAINE HCL (PF) 1 % IJ SOLN
INTRAMUSCULAR | Status: AC
  Filled 2014-11-10: qty 30

## 2014-11-10 MED ORDER — BIVALIRUDIN BOLUS VIA INFUSION
0.1000 mg/kg | Freq: Once | INTRAVENOUS | Status: DC
  Filled 2014-11-10: qty 7

## 2014-11-10 MED ORDER — SODIUM CHLORIDE 0.9 % IV SOLN
1.0000 mL/kg/h | INTRAVENOUS | Status: AC
  Administered 2014-11-10: 1 mL/kg/h via INTRAVENOUS

## 2014-11-10 MED ORDER — LORAZEPAM 0.5 MG PO TABS
0.5000 mg | ORAL_TABLET | Freq: Two times a day (BID) | ORAL | Status: DC
  Administered 2014-11-10 – 2014-11-11 (×2): 0.5 mg via ORAL
  Filled 2014-11-10 (×2): qty 1

## 2014-11-10 MED ORDER — FAMOTIDINE IN NACL 20-0.9 MG/50ML-% IV SOLN
INTRAVENOUS | Status: AC
  Filled 2014-11-10: qty 50

## 2014-11-10 NOTE — Interval H&P Note (Signed)
History and Physical Interval Note:  11/10/2014 1:32 PM  [name]  has presented today for surgery, with the diagnosis of shortness of breath/chf  - Class III Symptoms.  The various methods of treatment have been discussed with the patient and family. After consideration of risks, benefits and other options for treatment, the patient has consented to  Procedure(s): LEFT AND RIGHT HEART CATHETERIZATION WITH CORONARY ANGIOGRAM (N/A) & Graft Angiography +/- PCI. as a surgical intervention .  The patient's history has been reviewed, patient examined, no change in status, stable for surgery.  I have reviewed the patient's chart and labs.  Questions were answered to the patient's satisfaction.     Muhamed Luecke W    Cath Lab Visit (complete for each Cath Lab visit)  Clinical Evaluation Leading to the Procedure:   ACS: No.  Non-ACS:    Anginal Classification: CCS III  Anti-ischemic medical therapy: Minimal Therapy (1 class of medications)  Non-Invasive Test Results: Low-risk stress test findings: cardiac mortality <1%/year  Prior CABG: Previous CABG

## 2014-11-10 NOTE — H&P (View-Only) (Signed)
Patient ID: [name], female   DOB: 10/16/1955, 59 y.o.   MRN: [id number]    Patient Name: [name] Date of Encounter: 11/08/2014  Primary Care Provider:  PANOSH,[name], MD Primary Cardiologist:  Aryahi Denzler H  Problem List   Past Medical History  Diagnosis Date  . CAD (coronary artery disease)     a.  cath 09/2010: LAD stent patent, S-Int/dCFX ok, S-PDA ok, L-LAD atretic;  b. Lexiscan Myoview (02/2014):  no ischemia, EF 55%   . Hypertension   . Hx of CABG   . Hx of transesophageal echocardiography (TEE) for monitoring 11/2010    TEE 11/2010: EF 60-65%, BAE, trivial atrial septal shunt;  right heart cath in 10/2010 with elevated R and L heart pressures and diuretic started  . HLD (hyperlipidemia)   . Hypothyroidism   . COPD (chronic obstructive pulmonary disease)   . Pulmonary nodules     repeat CT due in 11/2011  . Acute right MCA stroke 11/07/10  . 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 Eczema   . Depression   . Atrial fibrillation     a. s/p TEE-DCCV 02/2104; b. Xarelto started  . Cardiomyopathy with EF 40% at TEE 02/17/14 (likely tachycardia mediated - Myoview 02/19/14 neg for ischemia with normal EF) 02/18/2014  . HPV test positive     with Ascus on pap 2015, followed by Dr Holland  . Sleep apnea     recent sleep study  in 04/2014 per chart review  shows no significant OSA  . Dysrhythmia 10/2014    atrial fib with rvr  . GERD (gastroesophageal reflux disease)   . Headache    Past Surgical History  Procedure Laterality Date  . Coronary artery bypass graft    . Debriment for infection in chest    . Chest wall reconstruction    . Bilateral knee surgery    . Bladder surgery    . Left mastoidectomy    . Hernia repair    . Cath 2012    . Tee without cardioversion N/A 02/17/2014    Procedure: TRANSESOPHAGEAL ECHOCARDIOGRAM (TEE);  Surgeon: Paula Ross V, MD;  Location: [facility];  Service: Cardiovascular;  Laterality: N/A;  . Cardioversion  N/A 02/17/2014    Procedure: CARDIOVERSION;  Surgeon: Paula Ross V, MD;  Location: [facility];  Service: Cardiovascular;  Laterality: N/A;    Allergies  Allergies  Allergen Reactions  . Amoxicillin Hives  . Avelox [Moxifloxacin Hcl In Nacl] Other (See Comments)    Mental breakdown.  . Hydrocodone Itching  . Penicillins Hives  . Sulfa Antibiotics Other (See Comments)    unknown    HPI  58-year-old female with prior medical history of coronary artery disease, s/p CABG in 2008, s/p cath 09/2010: LAD stent patent, S-Int/dCFX ok, S-PDA ok, L-LAD atretic;   Lexiscan Myoview (02/2014) was normal, she also has h/o hypertension, hyperlipidemia, paroxysmal atrial fibrillation and atrial flutter on chronic anticoagulation with xarelto, s/p stroke.   The patient was followed in our clinic by Dr. Stuckey until approximately 3 years ago when she had a stroke, became homeless and moved to [place].  She got back to [place] about 1 year ago and was seen in urgent care for a spider bite which she was told by a physician that her heartbeat are irregular. The patient was admitted to the hospital and cardioverted on 02/17/2014, on chronic anticoagulation with Xarelto. She was found to have mildly   impaired left ventricle systolic function with LVEF 40%, that was believed to be secondary to a-fib with RVR and improved back to normal, most recent LVEF 65-70%. She was started on Xarelto.   She had another paroxysm of a-fib with RVR on 03/06/14 and was started on amiodarone.  At that time patients ECG was suspicious of anterolateral ischemia but she underwent nuclear Lexiscan stress test that was negative. Since then she has been complaining of SOB and was treated for volume overload with Lasix that was recently held because of worsening Creatinine.   Today she comes and states that her SOB is progressively worsening. She complains of dyspnea with minimal exertion despite loosing  11 pounds in the last 3 months.    She denies palpitations, chest pain, lower extremity edema   Home Medications  Prior to Admission medications   Medication Sig Start Date End Date Taking? Authorizing Provider  ABILIFY 10 MG tablet TAKE 1 TABLET (10 MG TOTAL) BY MOUTH DAILY. 11/29/13  Yes Kurt Lauenstein, MD  aspirin EC 81 MG tablet Take 1 tablet (81 mg total) by mouth daily. 10/28/13  Yes Jeffery Anderson, MD  atorvastatin (LIPITOR) 40 MG tablet Take 1 tablet (40 mg total) by mouth daily. 11/16/13  Yes Kurt Lauenstein, MD  clopidogrel (PLAVIX) 75 MG tablet Take 1 tablet (75 mg total) by mouth daily. 11/16/13  Yes Kurt Lauenstein, MD  DULoxetine (CYMBALTA) 30 MG capsule Take 2 capsules (60 mg total) by mouth 2 (two) times daily. 11/16/13  Yes Kurt Lauenstein, MD  lamoTRIgine (LAMICTAL) 100 MG tablet Take 1 tablet (100 mg total) by mouth daily. 11/16/13  Yes Kurt Lauenstein, MD  levothyroxine (SYNTHROID, LEVOTHROID) 125 MCG tablet Take 1 tablet (125 mcg total) by mouth daily. 11/16/13  Yes Kurt Lauenstein, MD  lisinopril (PRINIVIL,ZESTRIL) 20 MG tablet TAKE 1 TABLET BY MOUTH DAILY. 11/29/13  Yes Kurt Lauenstein, MD  topiramate (TOPAMAX) 50 MG tablet Take 1 tablet (50 mg total) by mouth 2 (two) times daily. Start one tablet at night x 1 week then twice daily 01/03/14  Yes Pramod S Sethi, MD  traMADol (ULTRAM) 50 MG tablet Take 2 tablets (100 mg total) by mouth every 6 (six) hours as needed for moderate pain. 01/03/14  Yes Pramod S Sethi, MD    Family History  Family History  Problem Relation Age of Onset  . COPD Mother   . Heart disease Mother   . Arthritis Mother     Rheumatoid and PMR  . Osteoporosis Mother     Mom fractured hip  . Diabetes type II Mother   . Depression Mother   . Heart attack Father   . Depression Father   . Hypertension Father   . Alcohol abuse Father   . Depression Sister   . Anxiety disorder Sister   . Drug abuse Sister     Social History  History   Social History  . Marital Status: Divorced     Spouse Name: N/A  . Number of Children: 2  . Years of Education: N/A   Occupational History  . DISABLE     Social History Main Topics  . Smoking status: Former Smoker -- 0.25 packs/day for 30 years    Types: Cigarettes    Quit date: 09/10/2014  . Smokeless tobacco: Never Used  . Alcohol Use: No  . Drug Use: No  . Sexual Activity: Not Currently   Other Topics Concern  . Not on file   Social History Narrative   Divorced     After stroke was living with sister and mother after sister asked her to leave. Mom has since deceased    Difficulties over control.    Then moved back in for about 6 weeks.   . tranportaion difficult son  helping   Ex-smoker   Was living at friends  Sister and her had argument and she was told to leave .    She was living in a homeless shelter for the last 3 weeks [facility]    son had help with transportation and medication       Work status regular  before got sick on short-term disability now lost t insurance after the stroke and couldn't work was  denied ssi    2 times.  She is not eligible for Medicaid because she doesn't have dependent children.         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   Lives in  rented house 2 house mates males keep tp self has her own b room  Doing well      Review of Systems, as per HPI, otherwise negative General:  No chills, fever, night sweats or weight changes.  Cardiovascular:  No chest pain, dyspnea on exertion, edema, orthopnea, palpitations, paroxysmal nocturnal dyspnea. Dermatological: No rash, lesions/masses Respiratory: No cough, dyspnea Urologic: No hematuria, dysuria Abdominal:   No nausea, vomiting, diarrhea, bright red blood per rectum, melena, or hematemesis Neurologic:  No visual changes, wkns, changes in mental status. All other systems  reviewed and are otherwise negative except as noted above.  Physical Exam  Blood pressure 124/64, pulse 58, height 5' 4" (1.626 m), weight 151 lb (68.493 kg).  General: Pleasant, NAD Psych: Normal affect. Neuro: Alert and oriented X 3. Moves all extremities spontaneously. HEENT: Normal  Neck: Supple without bruits or JVD. Lungs:  Resp regular and unlabored, CTA. Heart: RRR no s3, s4, or murmurs. Abdomen: Soft, non-tender, non-distended, BS + x 4.  Extremities: No clubbing, cyanosis or edema. DP/PT/Radials 2+ and equal bilaterally.  Labs:  No results for input(s): CKTOTAL, CKMB, TROPONINI in the last 72 hours. Lab Results  Component Value Date   WBC 3.5* 10/13/2014   HGB 9.6* 10/13/2014   HCT 27.3* 10/13/2014   MCV 90.1 10/13/2014   PLT 178 10/13/2014       Component Value Date/Time   NA 139 11/02/2014 1125   K 3.3* 11/02/2014 1125   CL 104 11/02/2014 1125   CO2 28 11/02/2014 1125   GLUCOSE 104* 11/02/2014 1125   BUN 22 11/02/2014 1125   CREATININE 1.37* 11/02/2014 1125   CREATININE 1.22* 05/15/2014 1442   CALCIUM 9.5 11/02/2014 1125   PROT 7.4 10/11/2014 0954   ALBUMIN 4.4 10/11/2014 0954   AST 18 10/11/2014 0954   ALT 9 10/11/2014 0954   ALKPHOS 85 10/11/2014 0954   BILITOT 0.6 10/11/2014 0954   GFRNONAA 52* 10/14/2014 0413   GFRNONAA 47* 05/11/2014 2059   GFRAA 60* 10/14/2014 0413   GFRAA 54* 05/11/2014 2059   Lab Results  Component Value Date   CHOL 237* 11/16/2013   HDL 68 11/16/2013   LDLCALC 152* 11/16/2013   TRIG 85 11/16/2013   Accessory Clinical Findings  Echocardiogram - 05/27/2014 Left ventricle: The cavity size was normal. Wall thickness was increased in a pattern of moderate LVH. Systolic function   was normal. The estimated ejection fraction was in the range of 55% to 60%. Wall motion was normal; there were no regional wall motion abnormalities. Features are consistent with a pseudonormal left ventricular filling pattern, with  concomitant abnormal relaxation and increased filling pressure (grade 2 diastolic dysfunction). - Left atrium: The atrium was mildly dilated.  Echocardiogram: 11/08/2014 Study Conclusions  - Left ventricle: The cavity size was normal. There was mild focal basal hypertrophy of the septum. Systolic function was vigorous. The estimated ejection fraction was in the range of 65% to 70%. There is hypokinesis of the basalinferior myocardium. Doppler parameters are consistent with abnormal left ventricular relaxation (grade 1 diastolic dysfunction). - Aortic valve: There was trivial regurgitation. - Left atrium: The atrium was mildly dilated.  Impressions: - Compared to the prior study, there has been no significant interval change.    Assessment & Plan  58-year-old female  1. Dyspnea on exertion - progressively worsening - ECG shows negative T waves in the inferolateral leads that are unchanged from prior when she had negative stress test, however worsening symptoms despite optimal therapy and signs of CHF.  We will schedule for a left and right cardiac cath. We will start bicarb drip prior to the cath.   2. Chronic combined Systolic + Diastolic CHF (EF 40% in 02/2014): improved to 65-70% on echo today, only impaired relaxation with normal filling pressures, BNP today normal (previously  1600), Appears euvolemic on physical exam. Hold lasix.  3. CAD, S/P CABG and LAD stenting, continue ASA, atorvastatin, plavix, lisinopril.  4. Parox atrial flutter with variable block, currently in SR, on amiodarone.   5. Parox Atrial Fibrillation: s/p successful TEE/ DCCV back to NSR. On amiodarone and xarelto. CHADS-Vasc 5.   6. HTN: controlled.   8. HLD: Continue statin. She is on high dose Lipitor (80 mg). Last lipid panel was 11/16/13. LDL was not at goal, at 152 mg/dL. Goal in the setting of CAD is <70.   9. Hypokalemia - replace with KCl  Follow up after the cath.  Logan Vegh,  Jannae Fagerstrom H, MD, FACC 11/08/2014, 11:51 AM     

## 2014-11-10 NOTE — Interval H&P Note (Signed)
Ischemic Symptoms? CCS III (Marked limitation of ordinary activity) Anti-ischemic Medical Therapy? Minimal Therapy (1 class of medications) Non-invasive Test Results? Low-risk stress test findings: cardiac mortality <1%/year Prior CABG? Previous CABG   Patient Information:   >=1 SVG stenosis  U (6)  Indication: 50; Score: 6   Patient Information:   All bypass grafts patent, >=1 lesion(s) in native coronaries without bypass grafts  U (6)  Indication: 50; Score: 6   Patient Information:   Native 3V-CAD, failure of multiple grafts, depressed LVEF, patent LIMA graft PCI  U (6)  Indication: 68; Score: 6   Patient Information:   Native 3V-CAD, failure of multiple grafts, depressed LVEF, patent LIMA graft CABG  A (7)  Indication: 68; Score: 7   Patient Information:   Native 3V-CAD, failure of multiple grafts, depressed LVEF, nonfunctional LIMA graft PCI  A (8)  Indication: 69; Score: 8   Patient Information:   Native 3V-CAD, failure of multiple grafts, depressed LVEF, nonfunctional LIMA graft CABG  U (6)  Indication: 69; Score: 6

## 2014-11-10 NOTE — CV Procedure (Signed)
CARDIAC CATHETERIZATION AND PERCUTANEOUS CORONARY INTERVENTION REPORT  NAME:  [name]   MRN: [id number] DOB:  [birth year]   ADMIT DATE: 11/10/2014 Procedure Date: 11/10/2014  INTERVENTIONAL CARDIOLOGIST: Leonie Man, M.D., MS PRIMARY CARE PROVIDER: Lottie Dawson, MD PRIMARY CARDIOLOGIST: Ena Dawley, MD  PATIENT:  [name] is a 59 y.o. female with a history of severe multivessel disease status post CABG in 2007 with LIMA-LAD, SVG-RI-OM, SVG-RPDA. Shortly thereafter she was found to have an atretic LIMA to LAD and the proximal LAD was stented. Last catheterization was in January 2012 and the LAD stent was found to be patent as well as the grafts. LIMA to LAD was atretic. She had Lexiscan Myoview in June 2015 which was negative for ischemia. Despite aggressive medical management of her exertional chest tightness and dyspnea, she continues to have progressively worsening exertional dyspnea with intermittent chest tightness. She notes that she has dyspnea with minimal activity. This is despite losing 11 pounds. He does have a history of PAF with an admission in June 2015 with A. fib RVR. She is quite limited with Xarelto and was treated with amiodarone. She Is Referred for Right Left Heart Catheterization with Native Coronary and Graft Angiography plus or minus PCI.  PRE-OPERATIVE DIAGNOSIS:    Progressive Class III Angina  Known CAD status post CABG and PCI of LAD for atretic LIMA  PROCEDURES PERFORMED:    Left & Right Heart Catheterization with Native Coronary And Graft Angiography  via Right Common Femoral Artery & Veing  Percutaneous Balloon Angioplasty Only of the ostial and proximal-mid RPDA - unable to fully expand noncompliant balloon, therefore the decision was made to abort attempts to place a stent  PROCEDURE: The patient was brought to the [facility] Lab in the fasting state and prepped and draped in the usual sterile  fashion for Right Common Femoral Artery and Vein access.  Sterile technique was used including antiseptics, cap, gloves, gown, hand hygiene, mask and sheet. Skin prep: Chlorhexidine.   Consent: Risks of procedure as well as the alternatives and risks of each were explained to the (patient/caregiver). Consent for procedure obtained.   Time Out: Verified patient identification, verified procedure, site/side was marked, verified correct patient position, special equipment/implants available, medications/allergies/relevent history reviewed, required imaging and test results available. Performed.  Access:  Right Common Femoral Artery: 5 Fr Sheath - fluoroscopically guided modified Seldinger Technique (initial attempts were difficult including using Ultrasound needle Right Common Femoral Vein: 7 Fr Sheath - Seldinger Technique..  Right Heart Catheterization: 7 Fr [place] catheter advanced under fluoroscopy with balloon inflated to the RA, RV, then PCWP-PA for hemodynamic measurement.  Simultaneous FA & PA blood gases checked for SaO2% to calculate FICK CO/CI  Thermodilution Injections performed to calculate CO/CI  Simultaneous PCWP/LV & RV/LV pressures monitored with Angled Pigtail in LV.  Catheter removed completely out of the body with balloon deflated.  Left Heart Catheterization: 5 Fr Catheters advanced or exchanged over a J-wire; JL4 Catheter advanced first.  Left Coronary Artery Cineangiography: JL4 Catheter  Right Coronary Artery,  SVG-RI Cineangiography: JR4 Catheter  SVG-RCA Cineangiography: AL1 Catheter  LV Hemodynamics (LV Gram): Angled Pigtail Catheter  Sheaths removed in the post-procedure unit with manual pressure for hemostasis.   FINDINGS:  Hemodynamics:   SaO2%  Pressures mmHg  Mean P  mmHg  EDP  mmHg   Right Atrium    13/9    6   Right Ventricle    31/5  9   Pulmonary Artery   62   27/7   17    PCWP    19/15   16    Central Aortic   95   90/45   65    Left  Ventricle    90/4    18          Cardiac Output:   Cardiac Index:    Fick   4.39    2.51    Thermodilution   4.11    2.35     Left Ventriculography: Deferred  Coronary Anatomy:  Dominance: Right  Left Main: Normal caliber, calcified vessel that trifurcates into the LAD, Ramus Intermedius, and small nondominant Circumflex LAD: Normal/moderate to large caliber vessel that barely reaches the apex. There is a stent in the proximal segment that is roughly 20% in-stent restenosis. There are several small diagonal branches. The LAD beyond the stent is relatively free of disease.  Left Circumflex: Moderate caliber nondominant vessel with 80-90% stenosis in the mid AV groove segment just prior to the anastomosis of a large sequential vein graft.  OM1: The follow on branch in the OM is a small moderate caliber vessel with minimal disease.  Retrograde flow is seen up the sequential SVG-RI-OM showing complete opacification almost all the way up to the aortic ostium Ramus intermedius: Large-caliber vessel with 70% proximal disease. Fills both her native flow and through the vein graft. The vessel bifurcates into 1 larger one moderate caliber branch. Minimal luminal irregularities beyond the anastomosis site.   RCA: Large-caliber, dominant vessel with a proximal tubular 40% stenosis as well as a mid vessel focal 40-50% stenosis. The vessel continues into the right posterior AV groove terminating as a moderate large-caliber posterior lateral branch with 2 smaller branches. Prior to the AV Groove the Right Posterior Descending Artery (RPDA) has an ostial 95% stenosis followed by a slight area of poststenotic dilatation and then a tubular 95-99% stenosis just prior to the anastomosis site. There is mild dye hang up the distal end of the vein graft and then minimal disease until the most distal RPDA with there is a tubular 60%.    Graft Anatomy  LIMA-LAD was known to be atretic - not  visualized  Sequential SVG-RI-OM: Very large caliber widely patent graft to the perfuses the large-caliber ramus intermedius and moderate caliber OM branch. Both antegrade and retrograde flow was seen from native and graft injections.  SVG-RPDA: 100% proximal occlusion  After reviewing the initial angiography, the culprit lesion was thought to be related to the occlusion of the SVG-RPDA with ostial and early proximal stenosis in the RPDA of at least 95-99%..  Preparation were made to proceed with PCI on the native vessel as the graft appeared to be likely chronically occluded..  Percutaneous Coronary Intervention:  Sheath exchanged for 6 Fr Guide: 6 Fr   JR4 Guidewire: P[place], along with Luge for buddy wire Predilation Balloon: Trek 2.35m x 15 mm;   12 Atm x 30 Sec -2 inflations at the RPDA ostium  The balloon would not advance beyond the proximal PDA lesion  Following predilation with a 2.0 mm balloon, the balloon was reinflated just prior to the lesion that would not be successfully crossed to 12 Am for 30 sec. Predilation Balloon: Mini Trek 2.0 mm x 12 mm;   10-14 Atm x 30 Sec - 4 inflations from distal to proximal including the anastomosis site back to the ostium of the RPDA  At this point the  2.5 mm Trek balloon was attempted to advance but was unsuccessful Predilation Balloon: [place] Trek 2.0 mm x 12;   14 Atm 30 Sec,- 3 inflations from the anastomosis site back to the most proximal RPDA lesion Pre-dilation Balloon: Eup[place] 2.5 mm x12 mm;   12 Atm 30 Sec, 2 inflations at the most significant lesion 14 Atm x 30 Sec  One final inflation at 18 Atm x 30 Sec   Still the endoscope or 2.5 mm [place] trek balloon would not advance beyond the proximal PDA lesion.  Post-dilation Balloon: [place] Trek 2.5 mm x 6 mm; finally the shorter balloon was successfully advanced to the anastomosis site. A total of 5 inflations were performed.   10 Atm x 30 Sec at the anastomosis,   16 Atm 45 x 2 inflations  (the balloon inflation was not visualized despite being at 16 atm)  This balloon was pulled back to the ostium of the RPDA and inflated to 12 Atm x 30 Sec with complete opacification of the balloon  Final Diameter - 2.5 mm at the ostium   Despite multiple attempts, the ages culprit 2.0 mm x 12 mm balloon was unable to pass. A 2.5 mm x 15 mm [place] trek balloon would not pass beyond the proximal-mid RPDA lesion.  Post deployment angiography in multiple views, with and without guidewire in place revealed adequate vessel expansion in the ostium and early proximal to mid segment just prior to the anastomosis. The ostial lesion is reduced about 50% and the mid lesion reduced to about 60%..  There was no evidence of dissection or perforation. At this point the decision was made to abort any further interventional procedure as the contrast dye Hedrick encroached over 200 mL with her renal insufficiency. The concern was with the inability to inflate a noncompliant balloon in the proximal PDA, that a stent would not adequately expand.  MEDICATIONS:  Anesthesia:  Local Lidocaine 30 ml  Sedation:  3 mg IV Versed, 50 mcg IV fentanyl ;   Premedication: Bicarbonate Gtt  Omnipaque Contrast: 210 ml  Anticoagulation:  Angiomax/bivalirudin Bolus & drip - continue 2 hr post PCI  Anti-Platelet Agent:  Plavix 300 mg pre PCI & 300 mg Post PCI  Intracoronary nitroglycerin 100 g  Normal saline bolus 1500 mL  Pepcid 20 mg IV  PATIENT DISPOSITION:    The patient was transferred to the PACU holding area in a hemodynamicaly stable, chest pain free condition.  The patient tolerated the procedure well, and there were no complications.  EBL:   < 20 ml  The patient was stable before, during, and after the procedure.  POST-OPERATIVE DIAGNOSIS:    Severe native CAD involving the mid circumflex, mid ramus intermedius and ostial/proximal RPDA.  Occlusion of the SVG-RPDA  Suboptimal PTCA only of the ostial RPDA  and mid RPDA leading to the anastomosis site. Unable to fully expand the balloon.  Normal RV pressures with LVEDP estimated at 18 mmHg corresponding with a wedge pressure of 17 mmHg.  Cardiac output between 4.1-4.4 with cardiac index 2.35-2.51 by thermodilution and Fick respectively - indicating mild to moderately reduced LV function.  PLAN OF CARE:  The patient will be transferred to the postprocedure unit for post PCI care. We will continue bivalirudin at current rate for 2 hours or until by complete.  PTCA only, therefore we would continue dual independent for 1 month and then would continue Plavix alone along with Xarelto to complete 6 months.  Restart Xarelto tonight after bivalirudin  infusion complete  Continue to manage anginal symptoms with medications. If symptoms recur or worsen, would need to discuss with interventional colleagues potential PCI options for this difficult lesion. My concern was the inability to inflate a noncompliant balloon Foley at the most significant stenotic site in the proximal PDA. There was that a stent would not be fully deployed. There is a suspicion that there may be an external compression at that site from the previously placed graft.  Anticipate d/c in AM.    Leonie Man, M.D., M.S. Interventional Cardiologist   Pager # [phone]

## 2014-11-11 ENCOUNTER — Encounter (HOSPITAL_COMMUNITY): Payer: Self-pay | Admitting: *Deleted

## 2014-11-11 DIAGNOSIS — I480 Paroxysmal atrial fibrillation: Secondary | ICD-10-CM

## 2014-11-11 DIAGNOSIS — I25719 Atherosclerosis of autologous vein coronary artery bypass graft(s) with unspecified angina pectoris: Secondary | ICD-10-CM | POA: Diagnosis not present

## 2014-11-11 DIAGNOSIS — Z9861 Coronary angioplasty status: Secondary | ICD-10-CM

## 2014-11-11 DIAGNOSIS — I10 Essential (primary) hypertension: Secondary | ICD-10-CM | POA: Diagnosis not present

## 2014-11-11 DIAGNOSIS — I2510 Atherosclerotic heart disease of native coronary artery without angina pectoris: Secondary | ICD-10-CM

## 2014-11-11 DIAGNOSIS — I5032 Chronic diastolic (congestive) heart failure: Secondary | ICD-10-CM | POA: Diagnosis not present

## 2014-11-11 DIAGNOSIS — I129 Hypertensive chronic kidney disease with stage 1 through stage 4 chronic kidney disease, or unspecified chronic kidney disease: Secondary | ICD-10-CM | POA: Diagnosis not present

## 2014-11-11 DIAGNOSIS — N183 Chronic kidney disease, stage 3 unspecified: Secondary | ICD-10-CM

## 2014-11-11 DIAGNOSIS — I25119 Atherosclerotic heart disease of native coronary artery with unspecified angina pectoris: Secondary | ICD-10-CM | POA: Diagnosis not present

## 2014-11-11 DIAGNOSIS — I200 Unstable angina: Secondary | ICD-10-CM

## 2014-11-11 DIAGNOSIS — I25118 Atherosclerotic heart disease of native coronary artery with other forms of angina pectoris: Secondary | ICD-10-CM | POA: Diagnosis not present

## 2014-11-11 LAB — CBC
HEMATOCRIT: 28.5 % — AB (ref 36.0–46.0)
HEMOGLOBIN: 9.8 g/dL — AB (ref 12.0–15.0)
MCH: 31.9 pg (ref 26.0–34.0)
MCHC: 34.4 g/dL (ref 30.0–36.0)
MCV: 92.8 fL (ref 78.0–100.0)
Platelets: 216 10*3/uL (ref 150–400)
RBC: 3.07 MIL/uL — ABNORMAL LOW (ref 3.87–5.11)
RDW: 13.2 % (ref 11.5–15.5)
WBC: 5.3 10*3/uL (ref 4.0–10.5)

## 2014-11-11 LAB — BASIC METABOLIC PANEL
ANION GAP: 10 (ref 5–15)
BUN: 10 mg/dL (ref 6–23)
CO2: 20 mmol/L (ref 19–32)
Calcium: 8.5 mg/dL (ref 8.4–10.5)
Chloride: 112 mmol/L (ref 96–112)
Creatinine, Ser: 1.24 mg/dL — ABNORMAL HIGH (ref 0.50–1.10)
GFR, EST AFRICAN AMERICAN: 55 mL/min — AB (ref >90)
GFR, EST NON AFRICAN AMERICAN: 47 mL/min — AB (ref >90)
Glucose, Bld: 78 mg/dL (ref 70–99)
Potassium: 3.5 mmol/L (ref 3.5–5.1)
Sodium: 142 mmol/L (ref 135–145)

## 2014-11-11 MED ORDER — CLOPIDOGREL BISULFATE 75 MG PO TABS: 75.0000 mg | ORAL_TABLET | Freq: Every day | ORAL | Status: DC

## 2014-11-11 MED ORDER — ASPIRIN EC 81 MG PO TBEC: 81.0000 mg | DELAYED_RELEASE_TABLET | Freq: Every day | ORAL | Status: DC

## 2014-11-11 MED FILL — Sodium Chloride IV Soln 0.9%: INTRAVENOUS | Qty: 50 | Status: AC

## 2014-11-11 NOTE — Discharge Summary (Signed)
Discharge Summary   Patient ID: KAETLYN MELITA,  MRN: [id number], DOB/AGE: [birth year] 59 y.o.  Admit date: 11/10/2014 Discharge date: 11/11/2014  Primary Care Provider: [name] Primary Cardiologist: Eloy End, MD   Discharge Diagnoses Principal Problem:   Progressive angina: Class III  **S/P PTCA of the RPDA this admission in the setting of an occluded VG RPDA.  Active Problems:   Coronary artery disease s/p CABG and prior PCI to LAD   Chronic diastolic heart failure   Essential hypertension   CKD (chronic kidney disease), stage III   PAF (paroxysmal atrial fibrillation)  **Chronic amio/xarelto.    Allergies Allergies  Allergen Reactions  . Amoxicillin Hives  . Avelox [Moxifloxacin Hcl In Nacl] Other (See Comments)    Mental breakdown.  [name] Hydrocodone Itching  . Oxycodone Itching  . Penicillins Hives  . Sulfa Antibiotics Other (See Comments)    unknown   Procedures  Cardiac Catheterization and Percutaneous Coronary Intervention 3.3.2016  Hemodynamics:     SaO2%   Pressures mmHg   Mean P    mmHg   EDP   mmHg    Right Atrium      13/9      6    Right Ventricle      31/5      9    Pulmonary Artery    62    27/7    17      PCWP      19/15    16      Central Aortic    95    90/45    65      Left Ventricle      90/4      18                 Cardiac Output:     Cardiac Index:      Fick    4.39      2.51      Thermodilution    4.11      2.35       Left Ventriculography: Deferred  Coronary Anatomy:  Dominance: Right  Left Main: Normal caliber, calcified vessel that trifurcates into the LAD, Ramus Intermedius, and small nondominant Circumflex LAD: Normal/moderate to large caliber vessel that barely reaches the apex. There is a stent in the proximal segment that is roughly 20% in-stent restenosis. There are several small diagonal branches. The LAD beyond the stent is relatively free of disease.  Left Circumflex: Moderate caliber nondominant vessel with  80-90% stenosis in the mid AV groove segment just prior to the anastomosis of a large sequential vein graft.  OM1: The follow on branch in the OM is a small moderate caliber vessel with minimal disease.  Retrograde flow is seen up the sequential SVG-RI-OM showing complete opacification almost all the way up to the aortic ostium Ramus intermedius: Large-caliber vessel with 70% proximal disease. Fills both her native flow and through the vein graft. The vessel bifurcates into 1 larger one moderate caliber branch. Minimal luminal irregularities beyond the anastomosis site.     RCA: Large-caliber, dominant vessel with a proximal tubular 40% stenosis as well as a mid vessel focal 40-50% stenosis. The vessel continues into the right posterior AV groove terminating as a moderate large-caliber posterior lateral branch with 2 smaller branches. Prior to the AV Groove the Right Posterior Descending Artery (RPDA) has an ostial 95% stenosis followed by a slight area of poststenotic dilatation and then a tubular 95-99%  stenosis just prior to the anastomosis site. There is mild dye hang up the distal end of the vein graft and then minimal disease until the most distal RPDA with there is a tubular 60%.   Graft Anatomy  LIMA-LAD was known to be atretic - not visualized  Sequential SVG-RI-OM: Very large caliber widely patent graft to the perfuses the large-caliber ramus intermedius and moderate caliber OM branch. Both antegrade and retrograde flow was seen from native and graft injections.  SVG-RPDA: 100% proximal occlusion  **In the setting of severe RPDA disease with an occluded vein graft to the RPDA, balloon angioplasty was successfully performed with reduction of the ostial stenosis to 50% in the mid lesion to 60%.**  History of Present Illness  59 year old female with a prior history of coronary artery disease status post coronary artery bypass grafting in 2008. She was subsequently found to have an  atretic LIMA to the LAD and required stenting of the native LAD. She also has a history of hypertension, stage III chronic kidney disease, diastolic heart failure, and paroxysmal atrial fibrillation. She has been maintained on amiodarone and xarelto since June 2015.  She was seen in clinic on March 1 with complaints of progressive dyspnea. Decision was made to pursue diagnostic cardiac catheterization.  Hospital Course  Patient presented to the [facility] cardiac catheterization laboratory and underwent diagnostic cardiac catheterization revealing patent LAD stent. The sequential vein graft to the ramus intermedius and obtuse marginal was also patent. The vein graft to the PDA was found to be occluded, which was a new finding. The native PDA had severe distal disease and this was successfully treated with balloon angioplasty only. Patient tolerated the procedure well and postprocedure has been ambulatory without recurrent symptoms or limitations. Her renal function has remained stable and her creatinine is 1.24 this morning. She will be discharged home today in good condition. Because she is on chronic xarelto therapy, she will be discharged on aspirin, Plavix, and xarelto. She will need to discontinue aspirin in one month and continue Plavix and xarelto after that time.  Discharge Vitals Blood pressure 90/58, pulse 59, temperature 98.2 F (36.8 C), temperature source Oral, resp. rate 19, height  (1.626 m), weight 157 lb 10.1 oz (71.5 kg), SpO2 99 %.  Filed Weights   11/10/14 0853 11/11/14 0429  Weight: 154 lb (69.854 kg) 157 lb 10.1 oz (71.5 kg)   PHYSICAL EXAM  General: Pleasant, NAD. Neuro: Alert and oriented X 3. Moves all extremities spontaneously. Psych: Normal affect. HEENT:  Normal  Neck: Supple without bruits or JVD. Lungs:  Resp regular and unlabored, CTA. Heart: RRR no s3, s4, or murmurs. Abdomen: Soft, non-tender, non-distended, BS + x 4.  Extremities: No clubbing, cyanosis  or edema. DP/PT/Radials 2+ and equal bilaterally.  Labs  CBC  Recent Labs  11/08/14 1247 11/11/14 0509  WBC 5.3 5.3  NEUTROABS 3.5  --   HGB 11.8* 9.8*  HCT 34.2* 28.5*  MCV 92.7 92.8  PLT 285.0 216   Basic Metabolic Panel  Recent Labs  11/10/14 0919 11/11/14 0509  NA 138 142  K 3.6 3.5  CL 111 112  CO2 22 20  GLUCOSE 85 78  BUN 16 10  CREATININE 1.48* 1.24*  CALCIUM 9.2 8.5   Disposition  Pt is being discharged home today in good condition.  Follow-up Plans & Appointments      Follow-up Information    Follow up with [name], MD.   Specialties:  Internal Medicine,  Pediatrics   Why:  as scheduled.   Contact information:   [address] [place] [address] [phone]       Follow up with Tereso Newcomer, PA-C On 11/28/2014.   Specialty:  Physician Assistant   Why:  2:00 PM - Dr. Lindaann Slough PA   Contact information:   [address] [place] [address] [phone]       Discharge Medications    Medication List    TAKE these medications        albuterol 108 (90 BASE) MCG/ACT inhaler  Commonly known as:  PROVENTIL HFA;VENTOLIN HFA  Inhale 2 puffs into the lungs every 6 (six) hours as needed for wheezing or shortness of breath.     amiodarone 400 MG tablet  Commonly known as:  PACERONE  Take 1 tablet (400 mg total) by mouth daily.     aspirin EC 81 MG tablet  Take 1 tablet (81 mg total) by mouth daily.     atorvastatin 80 MG tablet  Commonly known as:  LIPITOR  Take 0.5 tablets (40 mg total) by mouth daily.     buPROPion 300 MG 24 hr tablet  Commonly known as:  WELLBUTRIN XL  Take 1 tablet (300 mg total) by mouth daily.     clopidogrel 75 MG tablet  Commonly known as:  PLAVIX  Take 1 tablet (75 mg total) by mouth daily with breakfast.     cyanocobalamin 1000 MCG tablet  Take 1 tablet (1,000 mcg total) by mouth daily.     feeding supplement (ENSURE COMPLETE) Liqd  Take 237 mLs by mouth 3  (three) times daily between meals.     ferrous sulfate 325 (65 FE) MG tablet  Take 1 tablet (325 mg total) by mouth 2 (two) times daily with a meal.     furosemide 40 MG tablet  Commonly known as:  LASIX  Take 1 tablet (40 mg total) by mouth daily.     levothyroxine 125 MCG tablet  Commonly known as:  SYNTHROID, LEVOTHROID  Take 1 tablet (125 mcg total) by mouth daily.     LORazepam 1 MG tablet  Commonly known as:  ATIVAN  Half qam  1  qhs     potassium chloride SA 20 MEQ tablet  Commonly known as:  K-DUR,KLOR-CON  Take 1 tablet (20 mEq) by mouth 4 times a day to total 80 mEq daily.     rivaroxaban 20 MG Tabs tablet  Commonly known as:  XARELTO  Take 1 tablet (20 mg total) by mouth daily with supper.     tamsulosin 0.4 MG Caps capsule  Commonly known as:  FLOMAX  Take 1 capsule (0.4 mg total) by mouth daily.     topiramate 50 MG tablet  Commonly known as:  TOPAMAX   in am and  at night     traMADol 50 MG tablet  Commonly known as:  ULTRAM  TAKE 2 TABLETS BY MOUTH EVERY 6 HOURS AS NEEDED FOR MODERATE PAIN       Outstanding Labs/Studies  None  Duration of Discharge Encounter   Greater than 30 minutes including physician time.  Signed, Nicolasa Ducking NP 11/11/2014, 10:58 AM   I saw evaluated the patient this morning. She was mostly complaining of discomfort in her back and hips. Denies any chest tightness or pressure. She has yet to ambulate besides to the bathroom. Last night she had difficulty voiding while laying flat. In and out catheter was used,  but she is now voiding on her own. Renal function is improved with hydration.  Groin is stable status post complex PTCA yesterday.  Provided she is able to ambulate without significant symptoms today. She should be discharged in stable condition. She will continue aspirin and Plavix for one month and then stop aspirin. She will continue Xarelto.  She will follow up with Dr. Delton See.  Marykay Lex,  M.D., M.S. Interventional Cardiologist   Pager # [phone]

## 2014-11-11 NOTE — Progress Notes (Signed)
Pt unable to void. Dr Leeann Must notified and order received for I & O Cath prn.

## 2014-11-11 NOTE — Progress Notes (Signed)
Site area: right groin  Site Prior to Removal:  Level 0  Pressure Applied For 60 MINUTES    Minutes Beginning at 21:00  Manual:   Yes.    Patient Status During Pull:  Alert oriented X 3  Post Pull Groin Site:  Level 0  Post Pull Instructions Given:  Yes.    Post Pull Pulses Present:  Yes.    Dressing Applied:  Yes.    Comments:  Pt tolerated sheath removal.  Will continue to monitor patient.

## 2014-11-11 NOTE — Progress Notes (Signed)
UR Completed Nestor Wieneke Graves-Bigelow, RN,BSN [phone]  

## 2014-11-11 NOTE — Progress Notes (Signed)
CARDIAC REHAB PHASE I   PRE:  Rate/Rhythm: 62 SR  BP:  Supine:   Sitting: 90/58  Standing:    SaO2:   MODE:  Ambulation: 300 ft   POST:  Rate/Rhythm: 73 SR  BP:  Supine:   Sitting: 113/60  Standing:    SaO2: 0810-0907 Pt walked 300 ft with asst x 1,c/o light headedness that did not improve with walking. Stated SOB better but did stop to catch her breath a couple of times. Reviewed NTG use,importance of watching sodium, modified ex ed. Congratulated pt on not smoking for four months. Discussed CRP 2 which pt has done in past. Pt would like to do again so will refer. Pt does not follow heart healthy diet as she does not like fruits or vegetables. She will take ensure but likes breads and sweets. Only vegetable pt likes is cooked carrots. Encouraged pt to read handouts and try to make some changes. Encouraged her to work with dietitian in CRP 2 when she attends. Pt knows needs to watch sodium but eats pizza, bacon,etc.  Diet will be a challenge for pt.   Luetta Nutting, RN BSN  11/11/2014 9:03 AM

## 2014-11-11 NOTE — Discharge Instructions (Signed)
**  PLEASE REMEMBER TO BRING ALL OF YOUR MEDICATIONS TO EACH OF YOUR FOLLOW-UP OFFICE VISITS. ° °NO HEAVY LIFTING OR SEXUAL ACTIVITY X 7 DAYS. °NO DRIVING X 3-5 DAYS. °NO SOAKING BATHS, HOT TUBS, POOLS, ETC., X 7 DAYS.  ° °Groin Site Care °Refer to this sheet in the next few weeks. These instructions provide you with information on caring for yourself after your procedure. Your caregiver may also give you more specific instructions. Your treatment has been planned according to current medical practices, but problems sometimes occur. Call your caregiver if you have any problems or questions after your procedure. °HOME CARE INSTRUCTIONS °· You may shower 24 hours after the procedure. Remove the bandage (dressing) and gently wash the site with plain soap and water. Gently pat the site dry.  °· Do not apply powder or lotion to the site.  °· Do not sit in a bathtub, swimming pool, or whirlpool for 5 to 7 days.  °· No bending, squatting, or lifting anything over 10 pounds (4.5 kg) as directed by your caregiver.  °· Inspect the site at least twice daily.  °· Do not drive home if you are discharged the same day of the procedure. Have someone else drive you.  °· You may drive 24 hours after the procedure unless otherwise instructed by your caregiver.  °What to expect: °· Any bruising will usually fade within 1 to 2 weeks.  °· Blood that collects in the tissue (hematoma) may be painful to the touch. It should usually decrease in size and tenderness within 1 to 2 weeks.  °SEEK IMMEDIATE MEDICAL CARE IF: °· You have unusual pain at the groin site or down the affected leg.  °· You have redness, warmth, swelling, or pain at the groin site.  °· You have drainage (other than a small amount of blood on the dressing).  °· You have chills.  °· You have a fever or persistent symptoms for more than 72 hours.  °· You have a fever and your symptoms suddenly get worse.  °· Your leg becomes pale, cool, tingly, or numb.  °You have heavy  bleeding from the site. Hold pressure on the site.  °

## 2014-11-17 ENCOUNTER — Ambulatory Visit (HOSPITAL_COMMUNITY): Payer: Self-pay | Admitting: Clinical

## 2014-11-17 ENCOUNTER — Other Ambulatory Visit: Payer: Self-pay

## 2014-11-17 ENCOUNTER — Encounter (HOSPITAL_COMMUNITY): Payer: Self-pay | Admitting: Emergency Medicine

## 2014-11-17 ENCOUNTER — Emergency Department (HOSPITAL_COMMUNITY)
Admission: EM | Admit: 2014-11-17 | Discharge: 2014-11-18 | Disposition: A | Discharge status: Home / Self Care | Payer: Medicare Other | Attending: Emergency Medicine | Admitting: Emergency Medicine

## 2014-11-17 ENCOUNTER — Emergency Department (HOSPITAL_COMMUNITY): Payer: Medicare Other

## 2014-11-17 ENCOUNTER — Ambulatory Visit: Payer: Self-pay | Admitting: Cardiology

## 2014-11-17 DIAGNOSIS — Z8719 Personal history of other diseases of the digestive system: Secondary | ICD-10-CM | POA: Diagnosis not present

## 2014-11-17 DIAGNOSIS — Z8614 Personal history of Methicillin resistant Staphylococcus aureus infection: Secondary | ICD-10-CM | POA: Insufficient documentation

## 2014-11-17 DIAGNOSIS — I10 Essential (primary) hypertension: Secondary | ICD-10-CM | POA: Insufficient documentation

## 2014-11-17 DIAGNOSIS — Z8619 Personal history of other infectious and parasitic diseases: Secondary | ICD-10-CM | POA: Insufficient documentation

## 2014-11-17 DIAGNOSIS — Z7902 Long term (current) use of antithrombotics/antiplatelets: Secondary | ICD-10-CM | POA: Diagnosis not present

## 2014-11-17 DIAGNOSIS — J449 Chronic obstructive pulmonary disease, unspecified: Secondary | ICD-10-CM | POA: Insufficient documentation

## 2014-11-17 DIAGNOSIS — Z79899 Other long term (current) drug therapy: Secondary | ICD-10-CM | POA: Diagnosis not present

## 2014-11-17 DIAGNOSIS — Z8669 Personal history of other diseases of the nervous system and sense organs: Secondary | ICD-10-CM | POA: Diagnosis not present

## 2014-11-17 DIAGNOSIS — F329 Major depressive disorder, single episode, unspecified: Secondary | ICD-10-CM | POA: Insufficient documentation

## 2014-11-17 DIAGNOSIS — R112 Nausea with vomiting, unspecified: Secondary | ICD-10-CM | POA: Insufficient documentation

## 2014-11-17 DIAGNOSIS — I2510 Atherosclerotic heart disease of native coronary artery without angina pectoris: Secondary | ICD-10-CM | POA: Diagnosis not present

## 2014-11-17 DIAGNOSIS — Z7901 Long term (current) use of anticoagulants: Secondary | ICD-10-CM | POA: Insufficient documentation

## 2014-11-17 DIAGNOSIS — E039 Hypothyroidism, unspecified: Secondary | ICD-10-CM | POA: Diagnosis not present

## 2014-11-17 DIAGNOSIS — E876 Hypokalemia: Secondary | ICD-10-CM | POA: Insufficient documentation

## 2014-11-17 DIAGNOSIS — Z872 Personal history of diseases of the skin and subcutaneous tissue: Secondary | ICD-10-CM | POA: Diagnosis not present

## 2014-11-17 DIAGNOSIS — Z7982 Long term (current) use of aspirin: Secondary | ICD-10-CM | POA: Diagnosis not present

## 2014-11-17 DIAGNOSIS — J439 Emphysema, unspecified: Secondary | ICD-10-CM | POA: Diagnosis not present

## 2014-11-17 DIAGNOSIS — Z87891 Personal history of nicotine dependence: Secondary | ICD-10-CM | POA: Diagnosis not present

## 2014-11-17 DIAGNOSIS — J42 Unspecified chronic bronchitis: Secondary | ICD-10-CM | POA: Diagnosis not present

## 2014-11-17 DIAGNOSIS — I429 Cardiomyopathy, unspecified: Secondary | ICD-10-CM | POA: Diagnosis not present

## 2014-11-17 DIAGNOSIS — Z951 Presence of aortocoronary bypass graft: Secondary | ICD-10-CM | POA: Diagnosis not present

## 2014-11-17 DIAGNOSIS — Z880 Allergy status to penicillin: Secondary | ICD-10-CM | POA: Diagnosis not present

## 2014-11-17 DIAGNOSIS — E785 Hyperlipidemia, unspecified: Secondary | ICD-10-CM | POA: Insufficient documentation

## 2014-11-17 DIAGNOSIS — K5900 Constipation, unspecified: Secondary | ICD-10-CM | POA: Diagnosis present

## 2014-11-17 LAB — CBC
HCT: 31.3 % — ABNORMAL LOW (ref 36.0–46.0)
Hemoglobin: 11.1 g/dL — ABNORMAL LOW (ref 12.0–15.0)
MCH: 31.9 pg (ref 26.0–34.0)
MCHC: 35.5 g/dL (ref 30.0–36.0)
MCV: 89.9 fL (ref 78.0–100.0)
Platelets: 274 10*3/uL (ref 150–400)
RBC: 3.48 MIL/uL — ABNORMAL LOW (ref 3.87–5.11)
RDW: 12.7 % (ref 11.5–15.5)
WBC: 5.2 10*3/uL (ref 4.0–10.5)

## 2014-11-17 LAB — COMPREHENSIVE METABOLIC PANEL
ALBUMIN: 3.9 g/dL (ref 3.5–5.2)
ALT: 10 U/L (ref 0–35)
ANION GAP: 13 (ref 5–15)
AST: 12 U/L (ref 0–37)
Alkaline Phosphatase: 90 U/L (ref 39–117)
BUN: 24 mg/dL — AB (ref 6–23)
CALCIUM: 9.6 mg/dL (ref 8.4–10.5)
CHLORIDE: 100 mmol/L (ref 96–112)
CO2: 25 mmol/L (ref 19–32)
CREATININE: 1.73 mg/dL — AB (ref 0.50–1.10)
GFR, EST AFRICAN AMERICAN: 37 mL/min — AB (ref >90)
GFR, EST NON AFRICAN AMERICAN: 32 mL/min — AB (ref >90)
Glucose, Bld: 77 mg/dL (ref 70–99)
Potassium: 2.6 mmol/L — CL (ref 3.5–5.1)
SODIUM: 138 mmol/L (ref 135–145)
TOTAL PROTEIN: 7.5 g/dL (ref 6.0–8.3)
Total Bilirubin: 0.8 mg/dL (ref 0.3–1.2)

## 2014-11-17 LAB — I-STAT TROPONIN, ED: Troponin i, poc: 0.02 ng/mL (ref 0.00–0.08)

## 2014-11-17 LAB — BRAIN NATRIURETIC PEPTIDE: B Natriuretic Peptide: 69 pg/mL (ref 0.0–100.0)

## 2014-11-17 MED ORDER — ONDANSETRON 4 MG PO TBDP: 4.0000 mg | ORAL_TABLET | Freq: Three times a day (TID) | ORAL | Status: DC | PRN

## 2014-11-17 MED ORDER — POTASSIUM CHLORIDE 10 MEQ/100ML IV SOLN
10.0000 meq | INTRAVENOUS | Status: AC
Start: 2014-11-17 — End: 2014-11-18
  Administered 2014-11-17 (×2): 10 meq via INTRAVENOUS
  Filled 2014-11-17 (×2): qty 100

## 2014-11-17 MED ORDER — ONDANSETRON HCL 4 MG/2ML IJ SOLN
4.0000 mg | Freq: Once | INTRAMUSCULAR | Status: AC
  Administered 2014-11-17: 4 mg via INTRAVENOUS
  Filled 2014-11-17: qty 2

## 2014-11-17 MED ORDER — SODIUM CHLORIDE 0.9 % IV BOLUS (SEPSIS)
1000.0000 mL | Freq: Once | INTRAVENOUS | Status: AC
  Administered 2014-11-17: 1000 mL via INTRAVENOUS

## 2014-11-17 MED ORDER — METOCLOPRAMIDE HCL 5 MG/ML IJ SOLN
10.0000 mg | Freq: Once | INTRAMUSCULAR | Status: AC
Start: 2014-11-18 — End: 2014-11-17
  Administered 2014-11-17: 10 mg via INTRAVENOUS
  Filled 2014-11-17: qty 2

## 2014-11-17 NOTE — ED Provider Notes (Signed)
CSN: [id number]     Arrival date & time 11/17/14  2046 History   First MD Initiated Contact with Patient 11/17/14 2207     Chief Complaint  Patient presents with  . Emesis  . Constipation     (Consider location/radiation/quality/duration/timing/severity/associated sxs/prior Treatment) Patient is a 59 y.o. female presenting with vomiting. The history is provided by the patient.  Emesis Severity:  Moderate Duration:  4 days Timing:  Intermittent Number of daily episodes:  4 Quality:  Stomach contents Progression:  Unchanged Chronicity:  New Recent urination:  Decreased Relieved by:  None tried Worsened by:  Nothing tried Ineffective treatments:  None tried Associated symptoms: no abdominal pain and no diarrhea   Associated symptoms comment:  Constipation since Saturday, which is not uncommon for her. Risk factors: no diabetes     Past Medical History  Diagnosis Date  . CAD (coronary artery disease)     a.  cath 09/2010: LAD stent patent, S-Int/dCFX ok, S-PDA ok, L-LAD atretic;  b. Lexiscan Myoview (02/2014):  no ischemia, EF 55%; c. 11/2014 Cath/PCI: LM nl, LAD 20pISR, LCX 80-44m, OM1 nl, RI 70p, RCA 40-63m, RPDA 95ost/95-71m (PTCA only w/ reduction to 50p/32m), 60d, L->LAD atretic, VG->RI->OM nl, VG->PDA 100p.  [name] Hypertension   . Hx of CABG   . Hx of transesophageal echocardiography (TEE) for monitoring 11/2010    TEE 11/2010: EF 60-65%, BAE, trivial atrial septal shunt;  right heart cath in 10/2010 with elevated R and L heart pressures and diuretic started  . HLD (hyperlipidemia)   . Hypothyroidism   . COPD (chronic obstructive pulmonary disease)   . Pulmonary nodules     repeat CT due in 11/2011  . Acute right MCA stroke 11/07/10  . 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 Eczema   . Depression   . Atrial fibrillation     a. s/p TEE-DCCV 02/2104; b. Xarelto started  . Cardiomyopathy with EF 40% at TEE 02/17/14 (likely tachycardia mediated - Myoview 02/19/14 neg  for ischemia with normal EF) 02/18/2014  . 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 Sleep apnea     recent sleep study  in 04/2014 per chart review  shows no significant OSA  . Dysrhythmia 10/2014    atrial fib with rvr  . GERD (gastroesophageal reflux disease)   . Headache    Past Surgical History  Procedure Laterality Date  . Coronary artery bypass graft    . Debriment for infection in chest    . Chest wall reconstruction    . Bilateral knee surgery    . Bladder surgery    . Left mastoidectomy    . Hernia repair    . Cath 2012    . Tee without cardioversion N/A 02/17/2014    Procedure: TRANSESOPHAGEAL ECHOCARDIOGRAM (TEE);  Surgeon: Pricilla Riffle, MD;  Location: [facility] ENDOSCOPY;  Service: Cardiovascular;  Laterality: N/A;  . Cardioversion N/A 02/17/2014    Procedure: CARDIOVERSION;  Surgeon: Pricilla Riffle, MD;  Location: [facility] ENDOSCOPY;  Service: Cardiovascular;  Laterality: N/A;  . Left and right heart catheterization with coronary angiogram N/A 11/10/2014    Procedure: LEFT AND RIGHT HEART CATHETERIZATION WITH CORONARY ANGIOGRAM;  Surgeon: Marykay Lex, MD;  Location: [facility] CATH LAB;  Service: Cardiovascular;  Laterality: N/A;   Family History  Problem Relation Age of Onset  . COPD Mother   . Heart disease Mother   .  Arthritis Mother     Rheumatoid and PMR  . Osteoporosis Mother     Mom fractured hip  . Diabetes type II Mother   . Depression Mother   . Heart attack Father   . Depression Father   . Hypertension Father   . Alcohol abuse Father   . Depression Sister   . Anxiety disorder Sister   . Drug abuse Sister    History  Substance Use Topics  . Smoking status: Former Smoker -- 0.25 packs/day for 30 years    Types: Cigarettes    Quit date: 09/10/2014  . Smokeless tobacco: Never Used  . Alcohol Use: No   OB History    No data available     Review of Systems  Constitutional: Negative for activity change and appetite change.  Respiratory:  Negative for cough and shortness of breath.   Cardiovascular: Negative for chest pain.  Gastrointestinal: Positive for nausea, vomiting and constipation. Negative for abdominal pain, diarrhea and blood in stool.  Genitourinary: Negative for dysuria and difficulty urinating.  All other systems reviewed and are negative.     Allergies  Avelox; Amoxicillin; Hydrocodone; Oxycodone; Penicillins; and Sulfa antibiotics  Home Medications   Prior to Admission medications   Medication Sig Start Date End Date Taking? Authorizing Provider  amiodarone (PACERONE) 400 MG tablet Take 1 tablet (400 mg total) by mouth daily. 10/14/14  Yes Belkys A Regalado, MD  aspirin EC 81 MG tablet Take 1 tablet (81 mg total) by mouth daily. 11/11/14  Yes Ok Anis, NP  atorvastatin (LIPITOR) 80 MG tablet Take 0.5 tablets (40 mg total) by mouth daily. Patient taking differently: Take 40 mg by mouth daily at 6 PM.  10/14/14  Yes Belkys A Regalado, MD  buPROPion (WELLBUTRIN XL) 300 MG 24 hr tablet Take 1 tablet (300 mg total) by mouth daily. 11/09/14  Yes Archer Asa, MD  clopidogrel (PLAVIX) 75 MG tablet Take 1 tablet (75 mg total) by mouth daily with breakfast. 11/11/14  Yes Ok Anis, NP  feeding supplement, ENSURE COMPLETE, (ENSURE COMPLETE) LIQD Take 237 mLs by mouth 3 (three) times daily between meals. 10/14/14  Yes Belkys A Regalado, MD  ferrous sulfate 325 (65 FE) MG tablet Take 1 tablet (325 mg total) by mouth 2 (two) times daily with a meal. Patient taking differently: Take 325 mg by mouth daily with breakfast.  10/14/14  Yes Belkys A Regalado, MD  furosemide (LASIX) 40 MG tablet Take 1 tablet (40 mg total) by mouth daily. 10/20/14  Yes Lars Masson, MD  levothyroxine (SYNTHROID, LEVOTHROID) 125 MCG tablet Take 1 tablet (125 mcg total) by mouth daily. 11/16/13  Yes Elvina Sidle, MD  LORazepam (ATIVAN) 1 MG tablet Half qam  1  qhs Patient taking differently: Take 0.5-1 mg by mouth 2 (two) times  daily. 0.5mg  during the day, 1mg  at bedtime 11/09/14  Yes Archer Asa, MD  potassium chloride SA (K-DUR,KLOR-CON) 20 MEQ tablet Take 1 tablet (20 mEq) by mouth 4 times a day to total 80 mEq daily. 11/09/14  Yes Lars Masson, MD  rivaroxaban (XARELTO) 20 MG TABS tablet Take 1 tablet (20 mg total) by mouth daily with supper. 02/20/14  Yes Brittainy Sherlynn Carbon, PA-C  tamsulosin (FLOMAX) 0.4 MG CAPS capsule Take 1 capsule (0.4 mg total) by mouth daily. 10/14/14  Yes Belkys A Regalado, MD  topiramate (TOPAMAX) 50 MG tablet 50mg  in am and 100mg  at night Patient taking differently: Take 50-100 mg by mouth 2 (  two) times daily. 50mg  in am and 100mg  at night 05/09/14  Yes Micki Riley, MD  albuterol (PROVENTIL HFA;VENTOLIN HFA) 108 (90 BASE) MCG/ACT inhaler Inhale 2 puffs into the lungs every 6 (six) hours as needed for wheezing or shortness of breath. 03/06/14   Esperanza Sheets, MD  ondansetron (ZOFRAN ODT) 4 MG disintegrating tablet Take 1 tablet (4 mg total) by mouth every 8 (eight) hours as needed for nausea. 11/17/14   Dorna Leitz, MD  traMADol (ULTRAM) 50 MG tablet TAKE 2 TABLETS BY MOUTH EVERY 6 HOURS AS NEEDED FOR MODERATE PAIN 11/09/14   Micki Riley, MD  vitamin B-12 1000 MCG tablet Take 1 tablet (1,000 mcg total) by mouth daily. 10/15/14   Belkys A Regalado, MD   BP 117/68 mmHg  Pulse 52  Temp(Src) 98 F (36.7 C) (Oral)  Resp 15  SpO2 100% Physical Exam  Constitutional: She is oriented to person, place, and time. She appears well-developed and well-nourished. No distress.  HENT:  Head: Normocephalic and atraumatic.  Mucous membranes dry  Neck: Normal range of motion. Neck supple.  Cardiovascular: Normal rate, regular rhythm and normal heart sounds.  Exam reveals no gallop and no friction rub.   No murmur heard. Pulmonary/Chest: Effort normal and breath sounds normal. No respiratory distress. She has no wheezes. She has no rales.  Abdominal: Soft. Bowel sounds are normal. She exhibits no  distension and no mass. There is no tenderness. There is no rebound and no guarding.  Lymphadenopathy:    She has no cervical adenopathy.  Neurological: She is alert and oriented to person, place, and time. No cranial nerve deficit.  Delayed speech, but appropriate.  Skin: Skin is warm and dry. She is not diaphoretic.  Nursing note and vitals reviewed.   ED Course  Procedures (including critical care time) Labs Review Labs Reviewed  CBC - Abnormal; Notable for the following:    RBC 3.48 (*)    Hemoglobin 11.1 (*)    HCT 31.3 (*)    All other components within normal limits  COMPREHENSIVE METABOLIC PANEL - Abnormal; Notable for the following:    Potassium 2.6 (*)    BUN 24 (*)    Creatinine, Ser 1.73 (*)    GFR calc non Af Amer 32 (*)    GFR calc Af Amer 37 (*)    All other components within normal limits  URINE CULTURE  BRAIN NATRIURETIC PEPTIDE  URINALYSIS, ROUTINE W REFLEX MICROSCOPIC  I-STAT TROPOININ, ED    Imaging Review Dg Chest 2 View  11/17/2014   CLINICAL DATA:  Nausea, vomiting, and constipation for 5 days. Cardiac bypass 9 years ago. Previous smoker, quit 4 months ago.  EXAM: CHEST  2 VIEW  COMPARISON:  10/11/2014  FINDINGS: Mild hyperinflation and diffuse interstitial changes consistent with emphysema and chronic bronchitis. Postoperative changes in the mediastinum. Normal heart size and pulmonary vascularity. No focal airspace disease or consolidation in the lungs. No blunting of costophrenic angles. No pneumothorax.  IMPRESSION: Mild emphysema and chronic bronchitic changes. No evidence of active pulmonary disease.   Electronically Signed   By: Burman Nieves M.D.   On: 11/17/2014 22:03     EKG Interpretation None      MDM   Final diagnoses:  Non-intractable vomiting with nausea, vomiting of unspecified type  Hypokalemia    [name] is a 59 y.o. female who presents with nausea and emesis since Saturday. No diarrhea and in fact has constipation.  Had a  cath last Thursday which was unremarkable s/p CABG a few years ago. No chest pain or dyspnea.  Emesis does not seem to be cardiac or worrisome in nature. Benign abdominal exam and no concern for acute abdominal process. AFVSS. Labs from triage show hypokalemia which is known - she is on potassium supplementation but has been unable to hold it down. Also appears volume depleted. Troponin negative. CXR without cardiopulmonary abnormality. Labs otherwise reassuring and she has a very benign exam. We will fluid hydrate, replace potassium, and treat with IV zofran. If able to tolerate PO, will d/c on PO zofran with PCP follow up. Likely viral as she has no other symptoms worrisome for emergent etiology to emesis.  12:06 AM Tolerating PO. Nausea improved. Will d/c with zofran after last run of potassium and PCP follow up. ED return precautions discussed.    Dorna Leitz, MD 11/18/14 8119  Azalia Bilis, MD 11/18/14 [phone]

## 2014-11-17 NOTE — Discharge Instructions (Signed)

## 2014-11-17 NOTE — ED Notes (Addendum)
Pt had cardiac cath done on Friday but they were unable to place stents- pt reports that since Saturday she has been nauseated and unable to keep food or liquid down.  Pt has not had a BM since Saturday, denies abdominal or chest pain.

## 2014-11-28 ENCOUNTER — Telehealth: Payer: Self-pay | Admitting: Internal Medicine

## 2014-11-28 ENCOUNTER — Encounter: Payer: Self-pay | Admitting: Physician Assistant

## 2014-11-28 DIAGNOSIS — E039 Hypothyroidism, unspecified: Secondary | ICD-10-CM

## 2014-11-28 DIAGNOSIS — E876 Hypokalemia: Secondary | ICD-10-CM

## 2014-11-28 NOTE — Telephone Encounter (Signed)
Pt seen in ED on 11/17/14.  Note does not say when to return to PCP.  Please advise.  Thanks!

## 2014-11-28 NOTE — Telephone Encounter (Signed)
I have placed the lab orders.  Please help the pt make a lab appt and follow up as specified below.

## 2014-11-28 NOTE — Telephone Encounter (Signed)
Unfortunately She had appt with cardiology today for [facility]  but canceled it .  Thursday 845 30 minutes or  10 30 10  45  But would be good if she can get BMP and magnesium level  ,tsh, free t4 drawn this week to get results before visit  Dx low K and hypothyroid   Lab Results  Component Value Date   WBC 5.2 11/17/2014   HGB 11.1* 11/17/2014   HCT 31.3* 11/17/2014   PLT 274 11/17/2014   GLUCOSE 77 11/17/2014   CHOL 237* 11/16/2013   TRIG 85 11/16/2013   HDL 68 11/16/2013   LDLDIRECT 153.0 01/15/2010   LDLCALC 152* 11/16/2013   ALT 10 11/17/2014   AST 12 11/17/2014   NA 138 11/17/2014   K 2.6* 11/17/2014   CL 100 11/17/2014   CREATININE 1.73* 11/17/2014   BUN 24* 11/17/2014   CO2 25 11/17/2014   TSH 0.092* 10/11/2014   INR 1.2* 11/08/2014   HGBA1C  02/11/2011    5.4 (NOTE)                                                                       According to the ADA Clinical Practice Recommendations for 2011, when HbA1c is used as a screening test:   >=6.5%   Diagnostic of Diabetes Mellitus           (if abnormal result  is confirmed)  5.7-6.4%   Increased risk of developing Diabetes Mellitus  References:Diagnosis and Classification of Diabetes Mellitus,Diabetes Care,2011,34(Suppl 1):S62-S69 and Standards of Medical Care in         Diabetes - 2011,Diabetes Care,2011,34  (Suppl 1):S11-S61.

## 2014-11-28 NOTE — Telephone Encounter (Signed)
Pt need a hosp fup 30 minute visit this week. Where can I add her.

## 2014-11-29 DIAGNOSIS — N312 Flaccid neuropathic bladder, not elsewhere classified: Secondary | ICD-10-CM | POA: Diagnosis not present

## 2014-11-29 DIAGNOSIS — R339 Retention of urine, unspecified: Secondary | ICD-10-CM | POA: Diagnosis not present

## 2014-11-30 NOTE — Telephone Encounter (Signed)
Pt scheduled  

## 2014-12-01 ENCOUNTER — Other Ambulatory Visit (INDEPENDENT_AMBULATORY_CARE_PROVIDER_SITE_OTHER): Payer: Medicare Other

## 2014-12-01 DIAGNOSIS — I4891 Unspecified atrial fibrillation: Secondary | ICD-10-CM | POA: Diagnosis not present

## 2014-12-01 DIAGNOSIS — J449 Chronic obstructive pulmonary disease, unspecified: Secondary | ICD-10-CM | POA: Diagnosis not present

## 2014-12-01 DIAGNOSIS — E039 Hypothyroidism, unspecified: Secondary | ICD-10-CM | POA: Diagnosis not present

## 2014-12-01 DIAGNOSIS — I69354 Hemiplegia and hemiparesis following cerebral infarction affecting left non-dominant side: Secondary | ICD-10-CM | POA: Diagnosis not present

## 2014-12-01 DIAGNOSIS — E785 Hyperlipidemia, unspecified: Secondary | ICD-10-CM | POA: Diagnosis not present

## 2014-12-01 DIAGNOSIS — E876 Hypokalemia: Secondary | ICD-10-CM

## 2014-12-01 DIAGNOSIS — N19 Unspecified kidney failure: Secondary | ICD-10-CM | POA: Diagnosis not present

## 2014-12-01 DIAGNOSIS — I5042 Chronic combined systolic (congestive) and diastolic (congestive) heart failure: Secondary | ICD-10-CM | POA: Diagnosis not present

## 2014-12-01 DIAGNOSIS — I10 Essential (primary) hypertension: Secondary | ICD-10-CM | POA: Diagnosis not present

## 2014-12-01 DIAGNOSIS — J45909 Unspecified asthma, uncomplicated: Secondary | ICD-10-CM | POA: Diagnosis not present

## 2014-12-01 LAB — BASIC METABOLIC PANEL
BUN: 20 mg/dL (ref 6–23)
CHLORIDE: 105 meq/L (ref 96–112)
CO2: 26 mEq/L (ref 19–32)
CREATININE: 1.69 mg/dL — AB (ref 0.40–1.20)
Calcium: 9.5 mg/dL (ref 8.4–10.5)
GFR: 33.13 mL/min — ABNORMAL LOW (ref >60.00)
GLUCOSE: 99 mg/dL (ref 70–99)
Potassium: 3.7 mEq/L (ref 3.5–5.1)
Sodium: 140 mEq/L (ref 135–145)

## 2014-12-01 LAB — MAGNESIUM: MAGNESIUM: 1.5 mg/dL (ref 1.5–2.5)

## 2014-12-01 LAB — POTASSIUM: POTASSIUM: 3.7 meq/L (ref 3.5–5.1)

## 2014-12-01 LAB — TSH: TSH: 0.75 u[IU]/mL (ref 0.35–4.50)

## 2014-12-01 LAB — T4, FREE: FREE T4: 1.53 ng/dL (ref 0.60–1.60)

## 2014-12-02 ENCOUNTER — Telehealth: Payer: Self-pay | Admitting: *Deleted

## 2014-12-02 NOTE — Telephone Encounter (Signed)
pt notified of lab results that were faxed over from Dr. Fabian Sharp. K+ ok, creatinine stable. Pt verbalized understanding to results given today. continue current treatment plan.

## 2014-12-10 ENCOUNTER — Inpatient Hospital Stay (HOSPITAL_COMMUNITY)
Admission: EM | Admit: 2014-12-10 | Discharge: 2014-12-14 | DRG: 309 | Disposition: A | Discharge status: Home / Self Care | Payer: Medicare Other | Attending: Internal Medicine | Admitting: Internal Medicine

## 2014-12-10 ENCOUNTER — Encounter (HOSPITAL_COMMUNITY): Payer: Self-pay | Admitting: *Deleted

## 2014-12-10 ENCOUNTER — Emergency Department (HOSPITAL_COMMUNITY): Payer: Medicare Other

## 2014-12-10 DIAGNOSIS — E876 Hypokalemia: Secondary | ICD-10-CM | POA: Diagnosis not present

## 2014-12-10 DIAGNOSIS — K219 Gastro-esophageal reflux disease without esophagitis: Secondary | ICD-10-CM | POA: Diagnosis present

## 2014-12-10 DIAGNOSIS — I10 Essential (primary) hypertension: Secondary | ICD-10-CM | POA: Diagnosis present

## 2014-12-10 DIAGNOSIS — I2510 Atherosclerotic heart disease of native coronary artery without angina pectoris: Secondary | ICD-10-CM | POA: Diagnosis present

## 2014-12-10 DIAGNOSIS — I255 Ischemic cardiomyopathy: Secondary | ICD-10-CM | POA: Diagnosis present

## 2014-12-10 DIAGNOSIS — N183 Chronic kidney disease, stage 3 unspecified: Secondary | ICD-10-CM | POA: Diagnosis present

## 2014-12-10 DIAGNOSIS — I25708 Atherosclerosis of coronary artery bypass graft(s), unspecified, with other forms of angina pectoris: Secondary | ICD-10-CM | POA: Diagnosis not present

## 2014-12-10 DIAGNOSIS — I25119 Atherosclerotic heart disease of native coronary artery with unspecified angina pectoris: Secondary | ICD-10-CM | POA: Diagnosis present

## 2014-12-10 DIAGNOSIS — I2489 Other forms of acute ischemic heart disease: Secondary | ICD-10-CM | POA: Diagnosis present

## 2014-12-10 DIAGNOSIS — Z9861 Coronary angioplasty status: Secondary | ICD-10-CM

## 2014-12-10 DIAGNOSIS — Z7901 Long term (current) use of anticoagulants: Secondary | ICD-10-CM

## 2014-12-10 DIAGNOSIS — J449 Chronic obstructive pulmonary disease, unspecified: Secondary | ICD-10-CM | POA: Diagnosis present

## 2014-12-10 DIAGNOSIS — I25810 Atherosclerosis of coronary artery bypass graft(s) without angina pectoris: Secondary | ICD-10-CM | POA: Diagnosis present

## 2014-12-10 DIAGNOSIS — I429 Cardiomyopathy, unspecified: Secondary | ICD-10-CM | POA: Diagnosis not present

## 2014-12-10 DIAGNOSIS — N179 Acute kidney failure, unspecified: Secondary | ICD-10-CM | POA: Diagnosis present

## 2014-12-10 DIAGNOSIS — F17210 Nicotine dependence, cigarettes, uncomplicated: Secondary | ICD-10-CM | POA: Diagnosis present

## 2014-12-10 DIAGNOSIS — Z8673 Personal history of transient ischemic attack (TIA), and cerebral infarction without residual deficits: Secondary | ICD-10-CM | POA: Diagnosis not present

## 2014-12-10 DIAGNOSIS — R0602 Shortness of breath: Secondary | ICD-10-CM | POA: Diagnosis not present

## 2014-12-10 DIAGNOSIS — Z9114 Patient's other noncompliance with medication regimen: Secondary | ICD-10-CM | POA: Diagnosis present

## 2014-12-10 DIAGNOSIS — Z833 Family history of diabetes mellitus: Secondary | ICD-10-CM

## 2014-12-10 DIAGNOSIS — E785 Hyperlipidemia, unspecified: Secondary | ICD-10-CM | POA: Diagnosis present

## 2014-12-10 DIAGNOSIS — I129 Hypertensive chronic kidney disease with stage 1 through stage 4 chronic kidney disease, or unspecified chronic kidney disease: Secondary | ICD-10-CM | POA: Diagnosis present

## 2014-12-10 DIAGNOSIS — E039 Hypothyroidism, unspecified: Secondary | ICD-10-CM | POA: Diagnosis present

## 2014-12-10 DIAGNOSIS — G4733 Obstructive sleep apnea (adult) (pediatric): Secondary | ICD-10-CM | POA: Diagnosis present

## 2014-12-10 DIAGNOSIS — I4891 Unspecified atrial fibrillation: Secondary | ICD-10-CM | POA: Diagnosis present

## 2014-12-10 DIAGNOSIS — J438 Other emphysema: Secondary | ICD-10-CM

## 2014-12-10 DIAGNOSIS — N3289 Other specified disorders of bladder: Secondary | ICD-10-CM | POA: Diagnosis present

## 2014-12-10 DIAGNOSIS — R339 Retention of urine, unspecified: Secondary | ICD-10-CM | POA: Diagnosis present

## 2014-12-10 DIAGNOSIS — Z811 Family history of alcohol abuse and dependence: Secondary | ICD-10-CM | POA: Diagnosis not present

## 2014-12-10 DIAGNOSIS — I214 Non-ST elevation (NSTEMI) myocardial infarction: Secondary | ICD-10-CM | POA: Diagnosis not present

## 2014-12-10 DIAGNOSIS — Z8249 Family history of ischemic heart disease and other diseases of the circulatory system: Secondary | ICD-10-CM

## 2014-12-10 DIAGNOSIS — Z825 Family history of asthma and other chronic lower respiratory diseases: Secondary | ICD-10-CM | POA: Diagnosis not present

## 2014-12-10 DIAGNOSIS — I9589 Other hypotension: Secondary | ICD-10-CM

## 2014-12-10 DIAGNOSIS — F17200 Nicotine dependence, unspecified, uncomplicated: Secondary | ICD-10-CM | POA: Diagnosis present

## 2014-12-10 DIAGNOSIS — I272 Other secondary pulmonary hypertension: Secondary | ICD-10-CM | POA: Diagnosis present

## 2014-12-10 DIAGNOSIS — I248 Other forms of acute ischemic heart disease: Secondary | ICD-10-CM | POA: Diagnosis not present

## 2014-12-10 DIAGNOSIS — I5032 Chronic diastolic (congestive) heart failure: Secondary | ICD-10-CM | POA: Diagnosis present

## 2014-12-10 DIAGNOSIS — I480 Paroxysmal atrial fibrillation: Principal | ICD-10-CM | POA: Diagnosis present

## 2014-12-10 LAB — I-STAT TROPONIN, ED: TROPONIN I, POC: 0.05 ng/mL (ref 0.00–0.08)

## 2014-12-10 LAB — URINALYSIS, ROUTINE W REFLEX MICROSCOPIC
Bilirubin Urine: NEGATIVE
GLUCOSE, UA: NEGATIVE mg/dL
HGB URINE DIPSTICK: NEGATIVE
Ketones, ur: NEGATIVE mg/dL
LEUKOCYTES UA: NEGATIVE
Nitrite: NEGATIVE
PH: 6.5 (ref 5.0–8.0)
PROTEIN: NEGATIVE mg/dL
Specific Gravity, Urine: 1.01 (ref 1.005–1.030)
UROBILINOGEN UA: 0.2 mg/dL (ref 0.0–1.0)

## 2014-12-10 LAB — COMPREHENSIVE METABOLIC PANEL
ALBUMIN: 4.1 g/dL (ref 3.5–5.2)
ALT: 10 U/L (ref 0–35)
ANION GAP: 16 — AB (ref 5–15)
AST: 18 U/L (ref 0–37)
Alkaline Phosphatase: 87 U/L (ref 39–117)
BUN: 26 mg/dL — ABNORMAL HIGH (ref 6–23)
CO2: 23 mmol/L (ref 19–32)
Calcium: 9.5 mg/dL (ref 8.4–10.5)
Chloride: 100 mmol/L (ref 96–112)
Creatinine, Ser: 1.83 mg/dL — ABNORMAL HIGH (ref 0.50–1.10)
GFR calc non Af Amer: 30 mL/min — ABNORMAL LOW (ref >90)
GFR, EST AFRICAN AMERICAN: 34 mL/min — AB (ref >90)
Glucose, Bld: 133 mg/dL — ABNORMAL HIGH (ref 70–99)
POTASSIUM: 2.4 mmol/L — AB (ref 3.5–5.1)
Sodium: 139 mmol/L (ref 135–145)
TOTAL PROTEIN: 8 g/dL (ref 6.0–8.3)
Total Bilirubin: 0.7 mg/dL (ref 0.3–1.2)

## 2014-12-10 LAB — I-STAT CHEM 8, ED
BUN: 27 mg/dL — ABNORMAL HIGH (ref 6–23)
CALCIUM ION: 1.08 mmol/L — AB (ref 1.12–1.23)
Chloride: 100 mmol/L (ref 96–112)
Creatinine, Ser: 1.7 mg/dL — ABNORMAL HIGH (ref 0.50–1.10)
GLUCOSE: 141 mg/dL — AB (ref 70–99)
HCT: 43 % (ref 36.0–46.0)
HEMOGLOBIN: 14.6 g/dL (ref 12.0–15.0)
Potassium: 2.4 mmol/L — CL (ref 3.5–5.1)
SODIUM: 141 mmol/L (ref 135–145)
TCO2: 20 mmol/L (ref 0–100)

## 2014-12-10 LAB — CBC WITH DIFFERENTIAL/PLATELET
BASOS PCT: 0 % (ref 0–1)
Basophils Absolute: 0 10*3/uL (ref 0.0–0.1)
EOS PCT: 3 % (ref 0–5)
Eosinophils Absolute: 0.3 10*3/uL (ref 0.0–0.7)
HEMATOCRIT: 39.9 % (ref 36.0–46.0)
Hemoglobin: 14.1 g/dL (ref 12.0–15.0)
LYMPHS ABS: 1.6 10*3/uL (ref 0.7–4.0)
LYMPHS PCT: 20 % (ref 12–46)
MCH: 32.1 pg (ref 26.0–34.0)
MCHC: 35.3 g/dL (ref 30.0–36.0)
MCV: 90.9 fL (ref 78.0–100.0)
MONO ABS: 0.6 10*3/uL (ref 0.1–1.0)
Monocytes Relative: 7 % (ref 3–12)
Neutro Abs: 5.7 10*3/uL (ref 1.7–7.7)
Neutrophils Relative %: 70 % (ref 43–77)
Platelets: 255 10*3/uL (ref 150–400)
RBC: 4.39 MIL/uL (ref 3.87–5.11)
RDW: 13 % (ref 11.5–15.5)
WBC: 8.2 10*3/uL (ref 4.0–10.5)

## 2014-12-10 LAB — MAGNESIUM: Magnesium: 1.5 mg/dL (ref 1.5–2.5)

## 2014-12-10 LAB — BASIC METABOLIC PANEL
Anion gap: 9 (ref 5–15)
BUN: 24 mg/dL — ABNORMAL HIGH (ref 6–23)
CO2: 27 mmol/L (ref 19–32)
Calcium: 8.2 mg/dL — ABNORMAL LOW (ref 8.4–10.5)
Chloride: 103 mmol/L (ref 96–112)
Creatinine, Ser: 1.54 mg/dL — ABNORMAL HIGH (ref 0.50–1.10)
GFR calc Af Amer: 42 mL/min — ABNORMAL LOW (ref >90)
GFR, EST NON AFRICAN AMERICAN: 36 mL/min — AB (ref >90)
GLUCOSE: 85 mg/dL (ref 70–99)
Potassium: 2.5 mmol/L — CL (ref 3.5–5.1)
Sodium: 139 mmol/L (ref 135–145)

## 2014-12-10 LAB — TROPONIN I: Troponin I: 0.19 ng/mL — ABNORMAL HIGH (ref <0.031)

## 2014-12-10 LAB — MRSA PCR SCREENING: MRSA BY PCR: NEGATIVE

## 2014-12-10 LAB — TSH: TSH: 3.109 u[IU]/mL (ref 0.350–4.500)

## 2014-12-10 MED ORDER — POTASSIUM CHLORIDE CRYS ER 20 MEQ PO TBCR
40.0000 meq | EXTENDED_RELEASE_TABLET | Freq: Once | ORAL | Status: AC
Start: 2014-12-10 — End: 2014-12-10
  Administered 2014-12-10: 40 meq via ORAL
  Filled 2014-12-10: qty 2

## 2014-12-10 MED ORDER — ALBUTEROL SULFATE HFA 108 (90 BASE) MCG/ACT IN AERS: 2.0000 | INHALATION_SPRAY | Freq: Four times a day (QID) | RESPIRATORY_TRACT | Status: DC | PRN

## 2014-12-10 MED ORDER — ATORVASTATIN CALCIUM 40 MG PO TABS
40.0000 mg | ORAL_TABLET | Freq: Every day | ORAL | Status: DC
  Administered 2014-12-10 – 2014-12-13 (×4): 40 mg via ORAL
  Filled 2014-12-10 (×4): qty 1

## 2014-12-10 MED ORDER — ALUM & MAG HYDROXIDE-SIMETH 200-200-20 MG/5ML PO SUSP
30.0000 mL | Freq: Four times a day (QID) | ORAL | Status: DC | PRN
  Filled 2014-12-10: qty 30

## 2014-12-10 MED ORDER — LEVOTHYROXINE SODIUM 125 MCG PO TABS
125.0000 ug | ORAL_TABLET | Freq: Every day | ORAL | Status: DC
  Administered 2014-12-11 – 2014-12-14 (×4): 125 ug via ORAL
  Filled 2014-12-10 (×6): qty 1

## 2014-12-10 MED ORDER — FERROUS SULFATE 325 (65 FE) MG PO TABS
325.0000 mg | ORAL_TABLET | Freq: Every day | ORAL | Status: DC
  Administered 2014-12-12 – 2014-12-14 (×3): 325 mg via ORAL
  Filled 2014-12-10 (×3): qty 1

## 2014-12-10 MED ORDER — CHLORHEXIDINE GLUCONATE CLOTH 2 % EX PADS
6.0000 | MEDICATED_PAD | Freq: Once | CUTANEOUS | Status: AC
  Administered 2014-12-10: 6 via TOPICAL

## 2014-12-10 MED ORDER — TOPIRAMATE 25 MG PO TABS
50.0000 mg | ORAL_TABLET | Freq: Every day | ORAL | Status: DC
  Administered 2014-12-11 – 2014-12-14 (×4): 50 mg via ORAL
  Filled 2014-12-10 (×5): qty 2

## 2014-12-10 MED ORDER — AMIODARONE HCL IN DEXTROSE 360-4.14 MG/200ML-% IV SOLN
30.0000 mg/h | INTRAVENOUS | Status: DC
  Administered 2014-12-11: 30 mg/h via INTRAVENOUS
  Filled 2014-12-10: qty 200

## 2014-12-10 MED ORDER — SODIUM CHLORIDE 0.9 % IV BOLUS (SEPSIS)
500.0000 mL | Freq: Once | INTRAVENOUS | Status: AC
  Administered 2014-12-10: 500 mL via INTRAVENOUS

## 2014-12-10 MED ORDER — MAGNESIUM SULFATE 4 GM/100ML IV SOLN
4.0000 g | Freq: Once | INTRAVENOUS | Status: AC
  Administered 2014-12-10: 4 g via INTRAVENOUS
  Filled 2014-12-10 (×2): qty 100

## 2014-12-10 MED ORDER — POTASSIUM CHLORIDE 10 MEQ/100ML IV SOLN
10.0000 meq | Freq: Once | INTRAVENOUS | Status: AC
  Administered 2014-12-10: 10 meq via INTRAVENOUS
  Filled 2014-12-10: qty 100

## 2014-12-10 MED ORDER — ASPIRIN EC 81 MG PO TBEC
81.0000 mg | DELAYED_RELEASE_TABLET | Freq: Every day | ORAL | Status: DC
  Administered 2014-12-11 – 2014-12-12 (×2): 81 mg via ORAL
  Filled 2014-12-10 (×2): qty 1

## 2014-12-10 MED ORDER — SODIUM CHLORIDE 0.9 % IJ SOLN
3.0000 mL | Freq: Two times a day (BID) | INTRAMUSCULAR | Status: DC
  Administered 2014-12-11 – 2014-12-14 (×5): 3 mL via INTRAVENOUS

## 2014-12-10 MED ORDER — POTASSIUM CHLORIDE CRYS ER 20 MEQ PO TBCR
40.0000 meq | EXTENDED_RELEASE_TABLET | Freq: Once | ORAL | Status: AC
  Administered 2014-12-10: 40 meq via ORAL
  Filled 2014-12-10: qty 2

## 2014-12-10 MED ORDER — ENSURE ENLIVE PO LIQD
237.0000 mL | Freq: Three times a day (TID) | ORAL | Status: DC
  Administered 2014-12-11 – 2014-12-14 (×3): 237 mL via ORAL
  Filled 2014-12-10 (×12): qty 237

## 2014-12-10 MED ORDER — TOPIRAMATE 100 MG PO TABS
100.0000 mg | ORAL_TABLET | Freq: Every day | ORAL | Status: DC
  Administered 2014-12-10 – 2014-12-13 (×4): 100 mg via ORAL
  Filled 2014-12-10 (×5): qty 1

## 2014-12-10 MED ORDER — ONDANSETRON 4 MG PO TBDP
4.0000 mg | ORAL_TABLET | Freq: Three times a day (TID) | ORAL | Status: DC | PRN
  Filled 2014-12-10: qty 1

## 2014-12-10 MED ORDER — TAMSULOSIN HCL 0.4 MG PO CAPS
0.4000 mg | ORAL_CAPSULE | Freq: Every day | ORAL | Status: DC
  Administered 2014-12-11 – 2014-12-14 (×4): 0.4 mg via ORAL
  Filled 2014-12-10 (×4): qty 1

## 2014-12-10 MED ORDER — CLOPIDOGREL BISULFATE 75 MG PO TABS
75.0000 mg | ORAL_TABLET | Freq: Every day | ORAL | Status: DC
  Administered 2014-12-11 – 2014-12-14 (×4): 75 mg via ORAL
  Filled 2014-12-10 (×4): qty 1

## 2014-12-10 MED ORDER — RIVAROXABAN 20 MG PO TABS
20.0000 mg | ORAL_TABLET | Freq: Every day | ORAL | Status: DC
  Administered 2014-12-10: 20 mg via ORAL
  Filled 2014-12-10: qty 1

## 2014-12-10 MED ORDER — BUPROPION HCL ER (XL) 300 MG PO TB24
300.0000 mg | ORAL_TABLET | Freq: Every day | ORAL | Status: DC
  Administered 2014-12-11 – 2014-12-14 (×4): 300 mg via ORAL
  Filled 2014-12-10 (×6): qty 1

## 2014-12-10 MED ORDER — ADULT MULTIVITAMIN W/MINERALS CH
1.0000 | ORAL_TABLET | Freq: Every day | ORAL | Status: DC
  Administered 2014-12-11 – 2014-12-14 (×4): 1 via ORAL
  Filled 2014-12-10 (×4): qty 1

## 2014-12-10 MED ORDER — SODIUM CHLORIDE 0.9 % IV SOLN
INTRAVENOUS | Status: DC
  Administered 2014-12-10: 20:00:00 via INTRAVENOUS

## 2014-12-10 MED ORDER — TRAMADOL HCL 50 MG PO TABS
50.0000 mg | ORAL_TABLET | Freq: Four times a day (QID) | ORAL | Status: DC | PRN
  Administered 2014-12-12 – 2014-12-14 (×3): 50 mg via ORAL
  Filled 2014-12-10 (×3): qty 1

## 2014-12-10 MED ORDER — AMIODARONE HCL IN DEXTROSE 360-4.14 MG/200ML-% IV SOLN
60.0000 mg/h | INTRAVENOUS | Status: AC
  Administered 2014-12-10: 60 mg/h via INTRAVENOUS
  Filled 2014-12-10: qty 200

## 2014-12-10 MED ORDER — LORAZEPAM 1 MG PO TABS
0.5000 mg | ORAL_TABLET | Freq: Two times a day (BID) | ORAL | Status: DC
  Administered 2014-12-11 – 2014-12-13 (×4): 0.5 mg via ORAL
  Filled 2014-12-10 (×5): qty 1

## 2014-12-10 MED ORDER — FOLIC ACID 1 MG PO TABS
1.0000 mg | ORAL_TABLET | Freq: Every day | ORAL | Status: DC
  Administered 2014-12-11 – 2014-12-14 (×4): 1 mg via ORAL
  Filled 2014-12-10 (×4): qty 1

## 2014-12-10 MED ORDER — TOPIRAMATE 25 MG PO TABS
50.0000 mg | ORAL_TABLET | Freq: Two times a day (BID) | ORAL | Status: DC
  Filled 2014-12-10: qty 4

## 2014-12-10 MED ORDER — ETOMIDATE 2 MG/ML IV SOLN: 10.0000 mg | Freq: Once | INTRAVENOUS | Status: DC

## 2014-12-10 MED ORDER — VITAMIN B-1 100 MG PO TABS
100.0000 mg | ORAL_TABLET | Freq: Every day | ORAL | Status: DC
  Administered 2014-12-11 – 2014-12-14 (×4): 100 mg via ORAL
  Filled 2014-12-10 (×4): qty 1

## 2014-12-10 MED ORDER — ALBUTEROL SULFATE (2.5 MG/3ML) 0.083% IN NEBU: 2.5000 mg | INHALATION_SOLUTION | Freq: Four times a day (QID) | RESPIRATORY_TRACT | Status: DC | PRN

## 2014-12-10 NOTE — H&P (Signed)
[facility] History and Physical                                                                                    [name], is a 59 y.o. female  MRN: [id number]   DOB - 1956-05-13  Admit Date - 12/10/2014  Outpatient Primary MD for the patient is [name], MD  With History of -  Past Medical History  Diagnosis Date  . CAD (coronary artery disease)     a.  cath 09/2010: LAD stent patent, S-Int/dCFX ok, S-PDA ok, L-LAD atretic;  b. Lexiscan Myoview (02/2014):  no ischemia, EF 55%; c. 11/2014 Cath/PCI: LM nl, LAD 20pISR, LCX 80-24m, OM1 nl, RI 70p, RCA 40-56m, RPDA 95ost/95-52m (PTCA only w/ reduction to 50p/40m), 60d, L->LAD atretic, VG->RI->OM nl, VG->PDA 100p.  [name] Hypertension   . Hx of CABG   . Hx of transesophageal echocardiography (TEE) for monitoring 11/2010    TEE 11/2010: EF 60-65%, BAE, trivial atrial septal shunt;  right heart cath in 10/2010 with elevated R and L heart pressures and diuretic started  . HLD (hyperlipidemia)   . Hypothyroidism   . COPD (chronic obstructive pulmonary disease)   . Pulmonary nodules     repeat CT due in 11/2011  . Acute right MCA stroke 11/07/10  . 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 Eczema   . Depression   . Atrial fibrillation     a. s/p TEE-DCCV 02/2104; b. Xarelto started  . Cardiomyopathy with EF 40% at TEE 02/17/14 (likely tachycardia mediated - Myoview 02/19/14 neg for ischemia with normal EF) 02/18/2014  . 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 Sleep apnea     recent sleep study  in 04/2014 per chart review  shows no significant OSA  . Dysrhythmia 10/2014    atrial fib with rvr  . GERD (gastroesophageal reflux disease)   . Headache       Past Surgical History  Procedure Laterality Date  . Coronary artery bypass graft    . Debriment for infection in chest    . Chest wall reconstruction    . Bilateral knee surgery    . Bladder surgery    . Left mastoidectomy    .  Hernia repair    . Cath 2012    . Tee without cardioversion N/A 02/17/2014    Procedure: TRANSESOPHAGEAL ECHOCARDIOGRAM (TEE);  Surgeon: Pricilla Riffle, MD;  Location: [facility] ENDOSCOPY;  Service: Cardiovascular;  Laterality: N/A;  . Cardioversion N/A 02/17/2014    Procedure: CARDIOVERSION;  Surgeon: Pricilla Riffle, MD;  Location: [facility] ENDOSCOPY;  Service: Cardiovascular;  Laterality: N/A;  . Left and right heart catheterization with coronary angiogram N/A 11/10/2014    Procedure: LEFT AND RIGHT HEART CATHETERIZATION WITH CORONARY ANGIOGRAM;  Surgeon: Marykay Lex, MD;  Location: [facility] CATH LAB;  Service: Cardiovascular;  Laterality: N/A;    in for   Chief Complaint  Patient presents with  . Weakness  . Shortness of Breath     HPI 59 year old female with multiple medical problems including known  coronary artery disease/history of CABG status post cardiac catheterization 3/3 with PCI to LAD, chronic atrial fibrillation with recurrent atrial fibrillation and RVR, status post TEE cardioversion June 2015, known COPD with pulmonary hypertension with ongoing tobacco abuse, pulmonary hypertension, previous right MCA stroke, pulmonary nodules, dyslipidemia, hypothyroidism and hypertension. She also has nonischemic cardiomyopathy presumed to be tachycardia mediated with last EF of 40% in June 2015. Patient presents to the ER with complaints of progressive shortness of breath for the past 2 days. She states symptoms initially started as malaise and dyspnea on exertion but today she noticed resting shortness of breath. In addition she was noticing chest heaviness in the center part of her chest without any diaphoresis or nausea and was also noticing that her upper arms were heavy especially when she attempted to comb her hair. Also is reporting anorexia /poor oral intake for at least several days. She has taken most of her medications as prescribed. She reports she ran out of amiodarone 4 days ago because she did not have  a refill but does have a follow-up appointment scheduled with cardiology, she ran out of Flomax recently because she could not afford the medication but her doctor gave her samples of Rapaflo. In addition she reports she forgets to take her potassium supplementation on many occasions. She also has low magnesium and states she has difficulty swallowing the magnesium pills.  Upon presentation to the ER patient was found to be in atrial fibrillation with RVR with ventricular rates up into the 170s. Her potassium was less than 3 and she was given IV potassium. ER physician considered cardioversion and possibility of discharge home but then it was discovered the patient had a brief interruption in her Xarelto for the catheterization procedure on 3/3 and therefore has not been a full month on this medication so the cardiologist recommended to not pursue cardioversion and instead recommended admission for rate control and further evaluation. Patient's blood pressure has been somewhat soft with systolic readings in the 90s. Patient reports she did take her Lasix today so fluid challenges have been ordered by the emergency room physician. Currently at rest her heart rate has been ranging between 120 and 140 bpm. She still is is facing some chest heaviness as described above but not as severe. She was afebrile and maintaining O2 saturations 100% on room air. Laboratory data revealed potassium 2.4 BUN 27, creatinine 1.70. Baseline BUN 20 with creatinine 1.69. Glucose 141, troponin 0.05, hemoglobin 14.6 with a baseline hemoglobin anywhere between 9.8 and 11.1.  Review of Systems   In addition to the HPI above,  No Fever-chills, myalgias or other constitutional symptoms No Headache, changes with Vision or hearing, new weakness, tingling, numbness in any extremity, No problems swallowing food or Liquids, indigestion/reflux No no awareness of palpitations No Abdominal pain, N/V; no melena or hematochezia, no dark tarry  stools, Bowel movements are regular, No dysuria, hematuria or flank pain No new skin rashes, lesions, masses or bruises, No new joints pains-aches No recent weight gain or loss No polyuria, polydypsia or polyphagia,  *A full 10 point Review of Systems was done, except as stated above, all other Review of Systems were negative.  Social History History  Substance Use Topics  . Smoking status:  currently smoking at least one quarter of a pack of cigarettes per day -had stopped smoking until about 2-3 months ago     Types: Cigarettes    Quit date: 09/10/2014  . Smokeless tobacco: Never Used  .  Alcohol Use: No    Family History Family History  Problem Relation Age of Onset  . COPD Mother   . Heart disease Mother   . Arthritis Mother     Rheumatoid and PMR  . Osteoporosis Mother     Mom fractured hip  . Diabetes type II Mother   . Depression Mother   . Heart attack Father   . Depression Father   . Hypertension Father   . Alcohol abuse Father   . Depression Sister   . Anxiety disorder Sister   . Drug abuse Sister     Prior to Admission medications   Medication Sig Start Date End Date Taking? Authorizing Provider  albuterol (PROVENTIL HFA;VENTOLIN HFA) 108 (90 BASE) MCG/ACT inhaler Inhale 2 puffs into the lungs every 6 (six) hours as needed for wheezing or shortness of breath. 03/06/14  Yes Esperanza Sheets, MD  amiodarone (PACERONE) 400 MG tablet Take 1 tablet (400 mg total) by mouth daily. 10/14/14  Yes Belkys A Regalado, MD  aspirin EC 81 MG tablet Take 1 tablet (81 mg total) by mouth daily. 11/11/14  Yes Ok Anis, NP  atorvastatin (LIPITOR) 80 MG tablet Take 0.5 tablets (40 mg total) by mouth daily. Patient taking differently: Take 40 mg by mouth daily at 6 PM.  10/14/14  Yes Belkys A Regalado, MD  buPROPion (WELLBUTRIN XL) 300 MG 24 hr tablet Take 1 tablet (300 mg total) by mouth daily. 11/09/14  Yes Archer Asa, MD  clopidogrel (PLAVIX) 75 MG tablet Take 1 tablet (75  mg total) by mouth daily with breakfast. 11/11/14  Yes Ok Anis, NP  feeding supplement, ENSURE COMPLETE, (ENSURE COMPLETE) LIQD Take 237 mLs by mouth 3 (three) times daily between meals. 10/14/14  Yes Belkys A Regalado, MD  ferrous sulfate 325 (65 FE) MG tablet Take 1 tablet (325 mg total) by mouth 2 (two) times daily with a meal. Patient taking differently: Take 325 mg by mouth daily with breakfast.  10/14/14  Yes Belkys A Regalado, MD  furosemide (LASIX) 40 MG tablet Take 1 tablet (40 mg total) by mouth daily. 10/20/14  Yes Lars Masson, MD  levothyroxine (SYNTHROID, LEVOTHROID) 125 MCG tablet Take 1 tablet (125 mcg total) by mouth daily. 11/16/13  Yes Elvina Sidle, MD  LORazepam (ATIVAN) 1 MG tablet Half qam  1  qhs Patient taking differently: Take 0.5-1 mg by mouth 2 (two) times daily. 0.5mg  during the day,  at bedtime 11/09/14  Yes Archer Asa, MD  ondansetron (ZOFRAN ODT) 4 MG disintegrating tablet Take 1 tablet (4 mg total) by mouth every 8 (eight) hours as needed for nausea. 11/17/14  Yes Dorna Leitz, MD  potassium chloride SA (K-DUR,KLOR-CON) 20 MEQ tablet Take 1 tablet (20 mEq) by mouth 4 times a day to total 80 mEq daily. 11/09/14  Yes Lars Masson, MD  rivaroxaban (XARELTO) 20 MG TABS tablet Take 1 tablet (20 mg total) by mouth daily with supper. 02/20/14  Yes Brittainy Sherlynn Carbon, PA-C  tamsulosin (FLOMAX) 0.4 MG CAPS capsule Take 1 capsule (0.4 mg total) by mouth daily. 10/14/14  Yes Belkys A Regalado, MD  topiramate (TOPAMAX) 50 MG tablet  in am and  at night Patient taking differently: Take 50-100 mg by mouth 2 (two) times daily.  in am and  at night 05/09/14  Yes Micki Riley, MD  traMADol (ULTRAM) 50 MG tablet TAKE 2 TABLETS BY MOUTH EVERY 6 HOURS AS NEEDED FOR MODERATE PAIN 11/09/14  Yes  Micki Riley, MD  vitamin B-12 1000 MCG tablet Take 1 tablet (1,000 mcg total) by mouth daily. 10/15/14  Yes Alba Cory, MD    Allergies  Allergen Reactions   . Avelox [Moxifloxacin Hcl In Nacl] Other (See Comments)    Mental breakdown.  [name] Amoxicillin Hives  . Hydrocodone Itching  . Oxycodone Itching  . Penicillins Hives  . Sulfa Antibiotics Other (See Comments)    unknown    Physical Exam  Vitals  Blood pressure 93/81, pulse 66, temperature 97.5 F (36.4 C), temperature source Oral, resp. rate 22, height 5\' 4"  (1.626 m), weight 150 lb (68.04 kg), SpO2 97 %.   General:  In no acute distress, appears healthy and well nourished  Psych:  Flat affect, Denies Suicidal or Homicidal ideations, Awake Alert, Oriented X 3. Speech and thought patterns are clear and appropriate, no apparent short term memory deficits  Neuro:   No focal neurological deficits, CN II through XII intact, Strength 5/5 all 4 extremities, Sensation intact all 4 extremities.  ENT:  Ears and Eyes appear Normal, Conjunctivae clear, PER. Moist oral mucosa without erythema or exudates.  Neck:  Supple, No lymphadenopathy appreciated  Respiratory:  Symmetrical chest wall movement, Good air movement bilaterally, CTAB except for faint expiratory crackles in left base. Room Air  Cardiac:  Irregular with atrial fibrillation and ventricular rates very between 120 and 140 bpm, No Murmurs, no LE edema noted, no JVD, No carotid bruits, peripheral pulses palpable at 2+  Abdomen:  Positive bowel sounds, Soft, Non tender, Non distended,  No masses appreciated, no obvious hepatosplenomegaly  Skin:  No Cyanosis, Normal Skin Turgor, No Skin Rash or Bruise.  Extremities: Symmetrical without obvious trauma or injury,  no effusions.  Data Review  CBC  Recent Labs Lab 12/10/14 1420 12/10/14 1435  WBC 8.2  --   HGB 14.1 14.6  HCT 39.9 43.0  PLT 255  --   MCV 90.9  --   MCH 32.1  --   MCHC 35.3  --   RDW 13.0  --   LYMPHSABS 1.6  --   MONOABS 0.6  --   EOSABS 0.3  --   BASOSABS 0.0  --     Chemistries   Recent Labs Lab 12/10/14 1420 12/10/14 1435  NA 139 141  K 2.4*  2.4*  CL 100 100  CO2 23  --   GLUCOSE 133* 141*  BUN 26* 27*  CREATININE 1.83* 1.70*  CALCIUM 9.5  --   AST 18  --   ALT 10  --   ALKPHOS 87  --   BILITOT 0.7  --     estimated creatinine clearance is 34.6 mL/min (by C-G formula based on Cr of 1.7).   Recent Labs  12/10/14 1430  TSH 3.109    Coagulation profile No results for input(s): INR, PROTIME in the last 168 hours.  No results for input(s): DDIMER in the last 72 hours.  Cardiac Enzymes No results for input(s): CKMB, TROPONINI, MYOGLOBIN in the last 168 hours.  Invalid input(s): CK  Invalid input(s): POCBNP  Urinalysis    Component Value Date/Time   COLORURINE STRAW* 10/12/2014 2250   APPEARANCEUR CLEAR 10/12/2014 2250   LABSPEC 1.007 10/12/2014 2250   PHURINE 5.5 10/12/2014 2250   GLUCOSEU NEGATIVE 10/12/2014 2250   HGBUR NEGATIVE 10/12/2014 2250   BILIRUBINUR NEGATIVE 10/12/2014 2250   BILIRUBINUR neg 05/11/2014 2056   KETONESUR NEGATIVE 10/12/2014 2250   PROTEINUR NEGATIVE 10/12/2014 2250  PROTEINUR neg 05/11/2014 2056   UROBILINOGEN 0.2 10/12/2014 2250   UROBILINOGEN 0.2 05/11/2014 2056   NITRITE NEGATIVE 10/12/2014 2250   NITRITE neg 05/11/2014 2056   LEUKOCYTESUR NEGATIVE 10/12/2014 2250    Imaging results:   Dg Chest 2 View  11/17/2014   CLINICAL DATA:  Nausea, vomiting, and constipation for 5 days. Cardiac bypass 9 years ago. Previous smoker, quit 4 months ago.  EXAM: CHEST  2 VIEW  COMPARISON:  10/11/2014  FINDINGS: Mild hyperinflation and diffuse interstitial changes consistent with emphysema and chronic bronchitis. Postoperative changes in the mediastinum. Normal heart size and pulmonary vascularity. No focal airspace disease or consolidation in the lungs. No blunting of costophrenic angles. No pneumothorax.  IMPRESSION: Mild emphysema and chronic bronchitic changes. No evidence of active pulmonary disease.   Electronically Signed   By: Burman Nieves M.D.   On: 11/17/2014 22:03   Dg  Chest Portable 1 View  12/10/2014   CLINICAL DATA:  Shortness of breath, weakness, chest heaviness  EXAM: PORTABLE CHEST - 1 VIEW  COMPARISON:  11/17/2014  FINDINGS: Evidence of CABG reidentified. Patient is rotated to the right. Heart size upper limits of normal. Mild prominence of the central pulmonary arteries with tapering may be seen with pulmonary arterial hypertension and is stable. No new focal pulmonary opacity. No pleural effusion. No acute osseous finding.  IMPRESSION: Borderline cardiomegaly without acute focal finding.   Electronically Signed   By: Christiana Pellant M.D.   On: 12/10/2014 14:53     EKG: Atrial fibrillation with ventricular rate 163 bpm, QTC 463 ms   Assessment & Plan  Principal Problem:   Atrial fibrillation with RVR -Admit to stepdown unit -Appreciate cardiology assistance; have discussed case with Dr. Rennis Golden and plan is to attempt rate control with IV amiodarone given patient's soft blood pressure readings -At this time not a candidate for cardioversion due to recent interruption in Xarelto therapy earlier this month -Suspect correction of potassium will also improve rate control  Active Problems:   Hypotension -Seems multifactorial related to increased ventricular rates as well as volume depletion based on review of patient's lab data -Agree with previously ordered fluid challenges and we'll provide IV fluid infusion and monitor closely -Hold any medications that might precipitate hypotension such as antihypertensive medications or diuretics    Hypokalemia -Given IV repletion in ER -Patient reports noncompliance with potassium in setting of regular use of Lasix -We'll increase twice a day potassium to 40 mEq for now and if potassium maintains stability consider decreasing dosage prior to discharge -Check magnesium level since patient also reports history of low magnesium    Hypothyroidism -TSH was normal during previous admission earlier in the month so no  indication to repeat    Chronic diastolic heart failure -Currently compensated but monitor closely with administration of IV fluids in setting of rapid ventricular response -Current chest x-ray without edema    Coronary artery disease s/p CABG and prior PCI to LAD -Patient also reporting chest heaviness and upper arm heaviness but this was in association with RVR so unclear if these symptoms were precipitated by tachycardia or if issues regarding recent PCI i.e. ischemic equivalent -Cardiology aware of above symptoms and we'll be monitoring -Cycle troponin    Cardiomyopathy with EF 40% at TEE 02/17/14 (likely tachycardia mediated - Myoview 02/19/14 neg for ischemia with normal EF) -Repeat echocardiogram 11/08/2014 shows improvement in ejection fraction up to 65-70%    HTN  -Current blood pressure soft so hold any offending  medications    Acute renal failure on CKD stage III -Appears to have minor acute renal insufficiency likely related to low perfusion from tachycardia as well as poor oral intake and setting of diuretics -IV fluid as above    Chronic urinary retention -Continue Flomax -Still having difficulty voiding so check bladder scan-in/out catheterization if needed    OSA  -Follow -Provide hour of sleep CPAP if patient desires    Hyperlipidemia -Continue statin therapy -Note on medication reconciliation from pharmacist documents patient reports has difficulty swallowing medication    History of right MCA stroke -Currently neurologically stable    Pulmonary hypertension/COPD/Tobacco use disorder -Currently without wheezing and appears compensated -Cessation counseling regarding tobacco abuse provided -Nicotine patch if needed-will not apply now given RVR    DVT Prophylaxis: Xarelto  Family Communication:  No family at bedside   Code Status:  Full code  Condition:  Stable  Time spent in minutes : 60   Zaakirah Kistner L. ANP on 12/10/2014 at 5:02 PM  Between 7am  to 7pm - Pager - [phone]  After 7pm go to [email or web address] - password [email or web address]  And look for the night coverage person covering me after hours  [facility]

## 2014-12-10 NOTE — ED Provider Notes (Signed)
CSN: [id number]     Arrival date & time 12/10/14  1352 History   First MD Initiated Contact with Patient 12/10/14 1410     Chief Complaint  Patient presents with  . Weakness  . Shortness of Breath     (Consider location/radiation/quality/duration/timing/severity/associated sxs/prior Treatment) Patient is a 59 y.o. female presenting with weakness and shortness of breath. The history is provided by the patient.  Weakness This is a new problem. Associated symptoms include chest pain and shortness of breath. Pertinent negatives include no abdominal pain and no headaches.  Shortness of Breath Associated symptoms: chest pain   Associated symptoms: no abdominal pain, no headaches, no rash and no vomiting    patient's had weakness for the last few days. States today she began to feel her heart going fast. States she had some chest pressure with it. Does not feel like her previous angina. Found to be in atrial fibrillation with RVR. Has a history of the same. Recent coronary ballooning without stent. No fevers. Has had a somewhat decreased oral intake. She is on Xarelto for her A. fib. Also on aspirin and Plavix.  Past Medical History  Diagnosis Date  . CAD (coronary artery disease)     a.  cath 09/2010: LAD stent patent, S-Int/dCFX ok, S-PDA ok, L-LAD atretic;  b. Lexiscan Myoview (02/2014):  no ischemia, EF 55%; c. 11/2014 Cath/PCI: LM nl, LAD 20pISR, LCX 80-59m, OM1 nl, RI 70p, RCA 40-65m, RPDA 95ost/95-11m (PTCA only w/ reduction to 50p/60m), 60d, L->LAD atretic, VG->RI->OM nl, VG->PDA 100p.  [name] Hypertension   . Hx of CABG   . Hx of transesophageal echocardiography (TEE) for monitoring 11/2010    TEE 11/2010: EF 60-65%, BAE, trivial atrial septal shunt;  right heart cath in 10/2010 with elevated R and L heart pressures and diuretic started  . HLD (hyperlipidemia)   . Hypothyroidism   . COPD (chronic obstructive pulmonary disease)   . Pulmonary nodules     repeat CT due in 11/2011  . Acute right MCA  stroke 11/07/10  . 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 Eczema   . Depression   . Atrial fibrillation     a. s/p TEE-DCCV 02/2104; b. Xarelto started  . Cardiomyopathy with EF 40% at TEE 02/17/14 (likely tachycardia mediated - Myoview 02/19/14 neg for ischemia with normal EF) 02/18/2014  . 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 Sleep apnea     recent sleep study  in 04/2014 per chart review  shows no significant OSA  . Dysrhythmia 10/2014    atrial fib with rvr  . GERD (gastroesophageal reflux disease)   . Headache    Past Surgical History  Procedure Laterality Date  . Coronary artery bypass graft    . Debriment for infection in chest    . Chest wall reconstruction    . Bilateral knee surgery    . Bladder surgery    . Left mastoidectomy    . Hernia repair    . Cath 2012    . Tee without cardioversion N/A 02/17/2014    Procedure: TRANSESOPHAGEAL ECHOCARDIOGRAM (TEE);  Surgeon: Pricilla Riffle, MD;  Location: [facility] ENDOSCOPY;  Service: Cardiovascular;  Laterality: N/A;  . Cardioversion N/A 02/17/2014    Procedure: CARDIOVERSION;  Surgeon: Pricilla Riffle, MD;  Location: [facility] ENDOSCOPY;  Service: Cardiovascular;  Laterality: N/A;  . Left and right heart catheterization with coronary  angiogram N/A 11/10/2014    Procedure: LEFT AND RIGHT HEART CATHETERIZATION WITH CORONARY ANGIOGRAM;  Surgeon: Marykay Lex, MD;  Location: [facility] CATH LAB;  Service: Cardiovascular;  Laterality: N/A;   Family History  Problem Relation Age of Onset  . COPD Mother   . Heart disease Mother   . Arthritis Mother     Rheumatoid and PMR  . Osteoporosis Mother     Mom fractured hip  . Diabetes type II Mother   . Depression Mother   . Heart attack Father   . Depression Father   . Hypertension Father   . Alcohol abuse Father   . Depression Sister   . Anxiety disorder Sister   . Drug abuse Sister    History  Substance Use Topics  . Smoking status: Former Smoker -- 0.25  packs/day for 30 years    Types: Cigarettes    Quit date: 09/10/2014  . Smokeless tobacco: Never Used  . Alcohol Use: No   OB History    No data available     Review of Systems  Constitutional: Positive for fatigue. Negative for activity change and appetite change.  Eyes: Negative for pain.  Respiratory: Positive for shortness of breath. Negative for chest tightness.   Cardiovascular: Positive for chest pain and palpitations. Negative for leg swelling.  Gastrointestinal: Negative for nausea, vomiting, abdominal pain and diarrhea.  Genitourinary: Negative for flank pain.  Musculoskeletal: Negative for back pain and neck stiffness.  Skin: Negative for rash.  Neurological: Positive for weakness. Negative for numbness and headaches.  Psychiatric/Behavioral: Negative for behavioral problems.      Allergies  Avelox; Amoxicillin; Hydrocodone; Oxycodone; Penicillins; and Sulfa antibiotics  Home Medications   Prior to Admission medications   Medication Sig Start Date End Date Taking? Authorizing Provider  albuterol (PROVENTIL HFA;VENTOLIN HFA) 108 (90 BASE) MCG/ACT inhaler Inhale 2 puffs into the lungs every 6 (six) hours as needed for wheezing or shortness of breath. 03/06/14  Yes Esperanza Sheets, MD  amiodarone (PACERONE) 400 MG tablet Take 1 tablet (400 mg total) by mouth daily. 10/14/14  Yes Belkys A Regalado, MD  aspirin EC 81 MG tablet Take 1 tablet (81 mg total) by mouth daily. 11/11/14  Yes Ok Anis, NP  atorvastatin (LIPITOR) 80 MG tablet Take 0.5 tablets (40 mg total) by mouth daily. Patient taking differently: Take 40 mg by mouth daily at 6 PM.  10/14/14  Yes Belkys A Regalado, MD  buPROPion (WELLBUTRIN XL) 300 MG 24 hr tablet Take 1 tablet (300 mg total) by mouth daily. 11/09/14  Yes Archer Asa, MD  clopidogrel (PLAVIX) 75 MG tablet Take 1 tablet (75 mg total) by mouth daily with breakfast. 11/11/14  Yes Ok Anis, NP  feeding supplement, ENSURE COMPLETE,  (ENSURE COMPLETE) LIQD Take 237 mLs by mouth 3 (three) times daily between meals. 10/14/14  Yes Belkys A Regalado, MD  ferrous sulfate 325 (65 FE) MG tablet Take 1 tablet (325 mg total) by mouth 2 (two) times daily with a meal. Patient taking differently: Take 325 mg by mouth daily with breakfast.  10/14/14  Yes Belkys A Regalado, MD  furosemide (LASIX) 40 MG tablet Take 1 tablet (40 mg total) by mouth daily. 10/20/14  Yes Lars Masson, MD  levothyroxine (SYNTHROID, LEVOTHROID) 125 MCG tablet Take 1 tablet (125 mcg total) by mouth daily. 11/16/13  Yes Elvina Sidle, MD  LORazepam (ATIVAN) 1 MG tablet Half qam  1  qhs Patient taking differently: Take 0.5-1 mg  by mouth 2 (two) times daily. 0.5mg  during the day, 1mg  at bedtime 11/09/14  Yes Archer Asa, MD  ondansetron (ZOFRAN ODT) 4 MG disintegrating tablet Take 1 tablet (4 mg total) by mouth every 8 (eight) hours as needed for nausea. 11/17/14  Yes Dorna Leitz, MD  potassium chloride SA (K-DUR,KLOR-CON) 20 MEQ tablet Take 1 tablet (20 mEq) by mouth 4 times a day to total 80 mEq daily. 11/09/14  Yes Lars Masson, MD  rivaroxaban (XARELTO) 20 MG TABS tablet Take 1 tablet (20 mg total) by mouth daily with supper. 02/20/14  Yes Brittainy Sherlynn Carbon, PA-C  tamsulosin (FLOMAX) 0.4 MG CAPS capsule Take 1 capsule (0.4 mg total) by mouth daily. 10/14/14  Yes Belkys A Regalado, MD  topiramate (TOPAMAX) 50 MG tablet 50mg  in am and 100mg  at night Patient taking differently: Take 50-100 mg by mouth 2 (two) times daily. 50mg  in am and 100mg  at night 05/09/14  Yes Micki Riley, MD  traMADol (ULTRAM) 50 MG tablet TAKE 2 TABLETS BY MOUTH EVERY 6 HOURS AS NEEDED FOR MODERATE PAIN 11/09/14  Yes Micki Riley, MD  vitamin B-12 1000 MCG tablet Take 1 tablet (1,000 mcg total) by mouth daily. 10/15/14  Yes Belkys A Regalado, MD   BP 110/85 mmHg  Pulse 103  Temp(Src) 97.5 F (36.4 C) (Oral)  Resp 19  Ht 5\' 4"  (1.626 m)  Wt 150 lb (68.04 kg)  BMI 25.73 kg/m2  SpO2  97% Physical Exam  Constitutional: She appears well-developed.  HENT:  Head: Normocephalic.  Eyes: Pupils are equal, round, and reactive to light.  Cardiovascular:  Irregular tachycardia  Pulmonary/Chest: Effort normal and breath sounds normal.  Abdominal: Soft.  Musculoskeletal: Normal range of motion.  Neurological: She is alert.  Skin: Skin is warm.    ED Course  Procedures (including critical care time) Labs Review Labs Reviewed  COMPREHENSIVE METABOLIC PANEL - Abnormal; Notable for the following:    Potassium 2.4 (*)    Glucose, Bld 133 (*)    BUN 26 (*)    Creatinine, Ser 1.83 (*)    GFR calc non Af Amer 30 (*)    GFR calc Af Amer 34 (*)    Anion gap 16 (*)    All other components within normal limits  I-STAT CHEM 8, ED - Abnormal; Notable for the following:    Potassium 2.4 (*)    BUN 27 (*)    Creatinine, Ser 1.70 (*)    Glucose, Bld 141 (*)    Calcium, Ion 1.08 (*)    All other components within normal limits  CBC WITH DIFFERENTIAL/PLATELET  TSH  URINALYSIS, ROUTINE W REFLEX MICROSCOPIC  I-STAT TROPOININ, ED    Imaging Review Dg Chest Portable 1 View  12/10/2014   CLINICAL DATA:  Shortness of breath, weakness, chest heaviness  EXAM: PORTABLE CHEST - 1 VIEW  COMPARISON:  11/17/2014  FINDINGS: Evidence of CABG reidentified. Patient is rotated to the right. Heart size upper limits of normal. Mild prominence of the central pulmonary arteries with tapering may be seen with pulmonary arterial hypertension and is stable. No new focal pulmonary opacity. No pleural effusion. No acute osseous finding.  IMPRESSION: Borderline cardiomegaly without acute focal finding.   Electronically Signed   By: Christiana Pellant M.D.   On: 12/10/2014 14:53     EKG Interpretation   Date/Time:  Saturday December 10 2014 14:04:43 EDT Ventricular Rate:  165 PR Interval:    QRS Duration: 88 QT Interval:  286 QTC Calculation: 473 R Axis:   68 Text Interpretation:  Atrial fibrillation with  rapid ventricular response  with premature ventricular or aberrantly conducted complexes Marked ST  abnormality, possible inferior subendocardial injury Abnormal ECG  Confirmed by Rubin Payor  MD, Harrold Donath [phone]) on 12/10/2014 4:06:41 PM      MDM   Final diagnoses:  Atrial fibrillation with RVR  Hypokalemia    Patient with atrial fibrillation with RVR. Also feeling somewhat fatigued. History of A. fib on Xarelto and amiodarone. Found to be hypokalemic and started on IV potassium. Will attempt cardioversion. May require admission but has chance of discharge home if she remains in a sinus rhythm. Discussed initially with Dr. Myrtis Ser.    Benjiman Core, MD 12/10/14 [phone]

## 2014-12-10 NOTE — ED Notes (Signed)
Pt reports having sob, chest heaviness, weakness today. HR 170 afib, ekg done at triage.

## 2014-12-10 NOTE — Consult Note (Signed)
CONSULTATION NOTE  Reason for Consult: A-fib with RVR   Requesting Physician: Dr. Alvino Chapel  Cardiologist: Dr. Meda Coffee  HPI: This is a 59 y.o. female with a past medical history significant for hernia disease status post CABG in 2007, hypertension, dyslipidemia, COPD and paroxysmal atrial fibrillation (last undergoing TEE/cardioversion in June 2015) on chronic amiodarone and Xarelto therapy.[name] She was recently seen in the office by Dr. Meda Coffee and noted to have EKG changes in the inferolateral leads which were unchanged from her prior negative stress test however given her worsening shortness of breath, left and right heart catheterization was recommended. This demonstrated an occluded saphenous vein graft to the RPDA. She had balloon angioplasty to the vein graft by Dr. Ellyn Hack with reduction of ostial stenosis to 50% and the mid focal lesion to 60%. TIMI-3 flow was restored. She is on chronic Xarelto therapy. The Xarelto was held recently for her catheterization. It was restarted on 11/11/2014, which is <1 month ago. She has a history of prior stroke and high CHADSVASC score of 5. She reports being out of some of her medications, including amiodarone for several days. Cardiology is asked to consult for evaluation and management.    PMHx:  Past Medical History  Diagnosis Date  . CAD (coronary artery disease)     a.  cath 09/2010: LAD stent patent, S-Int/dCFX ok, S-PDA ok, L-LAD atretic;  b. Lexiscan Myoview (02/2014):  no ischemia, EF 55%; c. 11/2014 Cath/PCI: LM nl, LAD 20pISR, LCX 80-85m OM1 nl, RI 70p, RCA 40-537mRPDA 95ost/95-9564mTCA only w/ reduction to 50p/4m73m0d, L->LAD atretic, VG->RI->OM nl, VG->PDA 100p.  . HyMarland Kitchenertension   . Hx of CABG   . Hx of transesophageal echocardiography (TEE) for monitoring 11/2010    TEE 11/2010: EF 60-65%, BAE, trivial atrial septal shunt;  right heart cath in 10/2010 with elevated R and L heart pressures and diuretic started  . HLD (hyperlipidemia)   .  Hypothyroidism   . COPD (chronic obstructive pulmonary disease)   . Pulmonary nodules     repeat CT due in 11/2011  . Acute right MCA stroke 11/07/10  . 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 Eczema   . Depression   . Atrial fibrillation     a. s/p TEE-DCCV 02/2104; b. Xarelto started  . Cardiomyopathy with EF 40% at TEE 02/17/14 (likely tachycardia mediated - Myoview 02/19/14 neg for ischemia with normal EF) 02/18/2014  . HPV test positive     with Ascus on pap 2015, followed by Dr HollMatthew SarasSleep apnea     recent sleep study  in 04/2014 per chart review  shows no significant OSA  . Dysrhythmia 10/2014    atrial fib with rvr  . GERD (gastroesophageal reflux disease)   . Headache    Past Surgical History  Procedure Laterality Date  . Coronary artery bypass graft    . Debriment for infection in chest    . Chest wall reconstruction    . Bilateral knee surgery    . Bladder surgery    . Left mastoidectomy    . Hernia repair    . Cath 2012    . Tee without cardioversion N/A 02/17/2014    Procedure: TRANSESOPHAGEAL ECHOCARDIOGRAM (TEE);  Surgeon: PaulFay Records;  [facility];  Service: Cardiovascular;  Laterality: N/A;  . Cardioversion N/A 02/17/2014    Procedure: CARDIOVERSION;  Surgeon: PaulFay Records;  [facility]  Service: Cardiovascular;  Laterality: N/A;  . Left and right heart catheterization with coronary angiogram N/A 11/10/2014    Procedure: LEFT AND RIGHT HEART CATHETERIZATION WITH CORONARY ANGIOGRAM;  Surgeon: Leonie Man, MD;  Location: [facility] CATH LAB;  Service: Cardiovascular;  Laterality: N/A;    FAMHx: Family History  Problem Relation Age of Onset  . COPD Mother   . Heart disease Mother   . Arthritis Mother     Rheumatoid and PMR  . Osteoporosis Mother     Mom fractured hip  . Diabetes type II Mother   . Depression Mother   . Heart attack Father   . Depression Father   . Hypertension Father   . Alcohol abuse Father   .  Depression Sister   . Anxiety disorder Sister   . Drug abuse Sister     SOCHx:  reports that she quit smoking about 2 months ago. Her smoking use included Cigarettes. She has a 7.5 pack-year smoking history. She has never used smokeless tobacco. She reports that she does not drink alcohol or use illicit drugs.  ALLERGIES: Allergies  Allergen Reactions  . Avelox [Moxifloxacin Hcl In Nacl] Other (See Comments)    Mental breakdown.  [name] Amoxicillin Hives  . Hydrocodone Itching  . Oxycodone Itching  . Penicillins Hives  . Sulfa Antibiotics Other (See Comments)    unknown    ROS: A comprehensive review of systems was negative except for: Constitutional: positive for fatigue Respiratory: positive for cough and dyspnea on exertion Cardiovascular: positive for chest pain, irregular heart beat and tachycardia  HOME MEDICATIONS:   Medication List    ASK your doctor about these medications        albuterol 108 (90 BASE) MCG/ACT inhaler  Commonly known as:  PROVENTIL HFA;VENTOLIN HFA  Inhale 2 puffs into the lungs every 6 (six) hours as needed for wheezing or shortness of breath.     amiodarone 400 MG tablet  Commonly known as:  PACERONE  Take 1 tablet (400 mg total) by mouth daily.     aspirin EC 81 MG tablet  Take 1 tablet (81 mg total) by mouth daily.     atorvastatin 80 MG tablet  Commonly known as:  LIPITOR  Take 0.5 tablets (40 mg total) by mouth daily.     buPROPion 300 MG 24 hr tablet  Commonly known as:  WELLBUTRIN XL  Take 1 tablet (300 mg total) by mouth daily.     clopidogrel 75 MG tablet  Commonly known as:  PLAVIX  Take 1 tablet (75 mg total) by mouth daily with breakfast.     cyanocobalamin 1000 MCG tablet  Take 1 tablet (1,000 mcg total) by mouth daily.     feeding supplement (ENSURE COMPLETE) Liqd  Take 237 mLs by mouth 3 (three) times daily between meals.     ferrous sulfate 325 (65 FE) MG tablet  Take 1 tablet (325 mg total) by mouth 2 (two) times  daily with a meal.     furosemide 40 MG tablet  Commonly known as:  LASIX  Take 1 tablet (40 mg total) by mouth daily.     levothyroxine 125 MCG tablet  Commonly known as:  SYNTHROID, LEVOTHROID  Take 1 tablet (125 mcg total) by mouth daily.     LORazepam 1 MG tablet  Commonly known as:  ATIVAN  Half qam  1  qhs     ondansetron 4 MG disintegrating tablet  Commonly known as:  ZOFRAN ODT  Take 1 tablet (4 mg total) by mouth every 8 (eight) hours as needed for nausea.     potassium chloride SA 20 MEQ tablet  Commonly known as:  K-DUR,KLOR-CON  Take 1 tablet (20 mEq) by mouth 4 times a day to total 80 mEq daily.     rivaroxaban 20 MG Tabs tablet  Commonly known as:  XARELTO  Take 1 tablet (20 mg total) by mouth daily with supper.     tamsulosin 0.4 MG Caps capsule  Commonly known as:  FLOMAX  Take 1 capsule (0.4 mg total) by mouth daily.     topiramate 50 MG tablet  Commonly known as:  TOPAMAX  90m in am and 1039mat night     traMADol 50 MG tablet  Commonly known as:  ULTRAM  TAKE 2 TABLETS BY MOUTH EVERY 6 HOURS AS NEEDED FOR MODERATE PAIN       HOSPITAL MEDICATIONS: I have reviewed the patient's current medications.  VITALS: Blood pressure 93/81, pulse 66, temperature 97.5 F (36.4 C), temperature source Oral, resp. rate 22, height 5' 4" (1.626 m), weight 150 lb (68.04 kg), SpO2 97 %.  PHYSICAL EXAM: General appearance: alert and no distress Neck: no carotid bruit and no JVD Lungs: diminished breath sounds bibasilar Heart: irregularly irregular rhythm and tachycardic Abdomen: soft, non-tender; bowel sounds normal; no masses,  no organomegaly Extremities: extremities normal, atraumatic, no cyanosis or edema Pulses: 2+ and symmetric Skin: pale, warm, dry Neurologic: Alert and oriented X 3, normal strength and tone. Normal symmetric reflexes. Normal coordination and gait Psych: Flat affect  LABS: Results for orders placed or performed during the hospital  encounter of 12/10/14 (from the past 48 hour(s))  CBC with Differential     Status: None   Collection Time: 12/10/14  2:20 PM  Result Value Ref Range   WBC 8.2 4.0 - 10.5 K/uL   RBC 4.39 3.87 - 5.11 MIL/uL   Hemoglobin 14.1 12.0 - 15.0 g/dL   HCT 39.9 36.0 - 46.0 %   MCV 90.9 78.0 - 100.0 fL   MCH 32.1 26.0 - 34.0 pg   MCHC 35.3 30.0 - 36.0 g/dL   RDW 13.0 11.5 - 15.5 %   Platelets 255 150 - 400 K/uL   Neutrophils Relative % 70 43 - 77 %   Neutro Abs 5.7 1.7 - 7.7 K/uL   Lymphocytes Relative 20 12 - 46 %   Lymphs Abs 1.6 0.7 - 4.0 K/uL   Monocytes Relative 7 3 - 12 %   Monocytes Absolute 0.6 0.1 - 1.0 K/uL   Eosinophils Relative 3 0 - 5 %   Eosinophils Absolute 0.3 0.0 - 0.7 K/uL   Basophils Relative 0 0 - 1 %   Basophils Absolute 0.0 0.0 - 0.1 K/uL  Comprehensive metabolic panel     Status: Abnormal   Collection Time: 12/10/14  2:20 PM  Result Value Ref Range   Sodium 139 135 - 145 mmol/L   Potassium 2.4 (LL) 3.5 - 5.1 mmol/L    Comment: REPEATED TO VERIFY CRITICAL RESULT CALLED TO, READ BACK BY AND VERIFIED WITH: A.THOMPSON,RN 12/10/14 _0  BY V.WILKINS    Chloride 100 96 - 112 mmol/L   CO2 23 19 - 32 mmol/L   Glucose, Bld 133 (H) 70 - 99 mg/dL   BUN 26 (H) 6 - 23 mg/dL   Creatinine, Ser 1.83 (H) 0.50 - 1.10 mg/dL   Calcium 9.5 8.4 - 10.5 mg/dL   Total Protein 8.0 6.0 -  8.3 g/dL   Albumin 4.1 3.5 - 5.2 g/dL   AST 18 0 - 37 U/L   ALT 10 0 - 35 U/L   Alkaline Phosphatase 87 39 - 117 U/L   Total Bilirubin 0.7 0.3 - 1.2 mg/dL   GFR calc non Af Amer 30 (L) >90 mL/min   GFR calc Af Amer 34 (L) >90 mL/min    Comment: (NOTE) The eGFR has been calculated using the CKD EPI equation. This calculation has not been validated in all clinical situations. eGFR's persistently <90 mL/min signify possible Chronic Kidney Disease.    Anion gap 16 (H) 5 - 15  TSH     Status: None   Collection Time: 12/10/14  2:30 PM  Result Value Ref Range   TSH 3.109 0.350 - 4.500 uIU/mL    I-stat troponin, ED     Status: None   Collection Time: 12/10/14  2:33 PM  Result Value Ref Range   Troponin i, poc 0.05 0.00 - 0.08 ng/mL   Comment 3            Comment: Due to the release kinetics of cTnI, a negative result within the first hours of the onset of symptoms does not rule out myocardial infarction with certainty. If myocardial infarction is still suspected, repeat the test at appropriate intervals.   I-stat Chem 8, ED     Status: Abnormal   Collection Time: 12/10/14  2:35 PM  Result Value Ref Range   Sodium 141 135 - 145 mmol/L   Potassium 2.4 (LL) 3.5 - 5.1 mmol/L   Chloride 100 96 - 112 mmol/L   BUN 27 (H) 6 - 23 mg/dL   Creatinine, Ser 1.70 (H) 0.50 - 1.10 mg/dL   Glucose, Bld 141 (H) 70 - 99 mg/dL   Calcium, Ion 1.08 (L) 1.12 - 1.23 mmol/L   TCO2 20 0 - 100 mmol/L   Hemoglobin 14.6 12.0 - 15.0 g/dL   HCT 43.0 36.0 - 46.0 %    IMAGING: Dg Chest Portable 1 View  12/10/2014   CLINICAL DATA:  Shortness of breath, weakness, chest heaviness  EXAM: PORTABLE CHEST - 1 VIEW  COMPARISON:  11/17/2014  FINDINGS: Evidence of CABG reidentified. Patient is rotated to the right. Heart size upper limits of normal. Mild prominence of the central pulmonary arteries with tapering may be seen with pulmonary arterial hypertension and is stable. No new focal pulmonary opacity. No pleural effusion. No acute osseous finding.  IMPRESSION: Borderline cardiomegaly without acute focal finding.   Electronically Signed   By: Conchita Paris M.D.   On: 12/10/2014 14:53    HOSPITAL DIAGNOSES: Principal Problem:   Atrial fibrillation with RVR Active Problems:   Coronary artery disease s/p CABG and prior PCI to LAD   Cardiomyopathy with EF 40% at TEE 02/17/14 (likely tachycardia mediated - Myoview 02/19/14 neg for ischemia with normal EF)   Hypotension   IMPRESSION/RECOMMENDATION: 1. A. fib with rapid ventricular response - she is hypotensive however this seems to be chronic. She is noted to  be hypokalemic which is significant and probably played a role in her A. fib recurrence. I agree with repleting her potassium and would check magnesium and replete that as it was 1.5 during her prior admission. I would recommend IV amiodarone loading without a bolus for rate control. She did have interruption of her anticoagulation for cardiac catheterization. Most likely, she would need a repeat TEE cardioversion if she were not to convert once she's been reloaded  on amiodarone. She does have a high CHADSVASC score of 5 and prior stroke. 2. Chest pain-she reports some chest heaviness and left and right arm heaviness. She did not report any chest pain with her recent catheterization only about one month ago, at which time she underwent balloon angioplasty to a saphenous vein graft to the PDA. It is feasible she's had reocclusion of that graft. I would cycle troponins, but would not place her on heparin at this point. Initial point-of-care troponin is slightly higher at 0.05 3. Ischemic cardiomyopathy, EF 40%- we'll reevaluate LV function by TEE if she does not convert spontaneously. Chest x-ray does not show any acute decompensated heart failure. BNP recently was 69.  Cardiology will follow along closely with you. Thanks for consulting [place].  Time Spent Directly with Patient: 30 minutes  Pixie Casino, MD, [facility] Attending Cardiologist [facility]  Shamiyah Ngu C 12/10/2014, 4:52 PM

## 2014-12-10 NOTE — Progress Notes (Signed)
Patient refuses CPAP, states she no longer has sleep apnea.

## 2014-12-10 NOTE — ED Notes (Signed)
Attempted report 

## 2014-12-11 DIAGNOSIS — G4733 Obstructive sleep apnea (adult) (pediatric): Secondary | ICD-10-CM

## 2014-12-11 DIAGNOSIS — I214 Non-ST elevation (NSTEMI) myocardial infarction: Secondary | ICD-10-CM

## 2014-12-11 DIAGNOSIS — Z8673 Personal history of transient ischemic attack (TIA), and cerebral infarction without residual deficits: Secondary | ICD-10-CM

## 2014-12-11 DIAGNOSIS — I25810 Atherosclerosis of coronary artery bypass graft(s) without angina pectoris: Secondary | ICD-10-CM

## 2014-12-11 DIAGNOSIS — Z720 Tobacco use: Secondary | ICD-10-CM

## 2014-12-11 DIAGNOSIS — I480 Paroxysmal atrial fibrillation: Principal | ICD-10-CM

## 2014-12-11 LAB — HEMOGLOBIN AND HEMATOCRIT, BLOOD
HCT: 29.8 % — ABNORMAL LOW (ref 36.0–46.0)
HCT: 28.4 % — ABNORMAL LOW (ref 36.0–46.0)
HEMATOCRIT: 30.6 % — AB (ref 36.0–46.0)
HEMATOCRIT: 29.2 % — AB (ref 36.0–46.0)
HEMOGLOBIN: 10.2 g/dL — AB (ref 12.0–15.0)
HEMOGLOBIN: 10.7 g/dL — AB (ref 12.0–15.0)
Hemoglobin: 10.1 g/dL — ABNORMAL LOW (ref 12.0–15.0)
Hemoglobin: 10.1 g/dL — ABNORMAL LOW (ref 12.0–15.0)

## 2014-12-11 LAB — CBC
HCT: 31.1 % — ABNORMAL LOW (ref 36.0–46.0)
HEMOGLOBIN: 10.9 g/dL — AB (ref 12.0–15.0)
MCH: 31.9 pg (ref 26.0–34.0)
MCHC: 35 g/dL (ref 30.0–36.0)
MCV: 90.9 fL (ref 78.0–100.0)
Platelets: 169 10*3/uL (ref 150–400)
RBC: 3.42 MIL/uL — ABNORMAL LOW (ref 3.87–5.11)
RDW: 13.2 % (ref 11.5–15.5)
WBC: 6.2 10*3/uL (ref 4.0–10.5)

## 2014-12-11 LAB — BASIC METABOLIC PANEL
Anion gap: 8 (ref 5–15)
BUN: 20 mg/dL (ref 6–23)
CO2: 24 mmol/L (ref 19–32)
CREATININE: 1.45 mg/dL — AB (ref 0.50–1.10)
Calcium: 8.1 mg/dL — ABNORMAL LOW (ref 8.4–10.5)
Chloride: 108 mmol/L (ref 96–112)
GFR calc Af Amer: 45 mL/min — ABNORMAL LOW (ref >90)
GFR calc non Af Amer: 39 mL/min — ABNORMAL LOW (ref >90)
GLUCOSE: 104 mg/dL — AB (ref 70–99)
POTASSIUM: 2.8 mmol/L — AB (ref 3.5–5.1)
SODIUM: 140 mmol/L (ref 135–145)

## 2014-12-11 LAB — TROPONIN I
TROPONIN I: 0.22 ng/mL — AB (ref <0.031)
Troponin I: 0.25 ng/mL — ABNORMAL HIGH (ref <0.031)

## 2014-12-11 LAB — MAGNESIUM: MAGNESIUM: 2.7 mg/dL — AB (ref 1.5–2.5)

## 2014-12-11 MED ORDER — HEPARIN (PORCINE) IN NACL 100-0.45 UNIT/ML-% IJ SOLN
950.0000 [IU]/h | INTRAMUSCULAR | Status: DC
  Administered 2014-12-11: 950 [IU]/h via INTRAVENOUS
  Filled 2014-12-11: qty 250

## 2014-12-11 MED ORDER — OFF THE BEAT BOOK
Freq: Once | Status: AC
  Administered 2014-12-11: 06:00:00
  Filled 2014-12-11: qty 1

## 2014-12-11 MED ORDER — POTASSIUM CHLORIDE CRYS ER 20 MEQ PO TBCR
40.0000 meq | EXTENDED_RELEASE_TABLET | Freq: Two times a day (BID) | ORAL | Status: DC
  Administered 2014-12-11 – 2014-12-13 (×6): 40 meq via ORAL
  Filled 2014-12-11 (×7): qty 2

## 2014-12-11 MED ORDER — AMIODARONE HCL 200 MG PO TABS
400.0000 mg | ORAL_TABLET | Freq: Every day | ORAL | Status: DC
  Administered 2014-12-11 – 2014-12-14 (×4): 400 mg via ORAL
  Filled 2014-12-11 (×4): qty 2

## 2014-12-11 MED ORDER — SODIUM CHLORIDE 0.9 % IV SOLN: INTRAVENOUS | Status: DC

## 2014-12-11 MED ORDER — SODIUM CHLORIDE 0.9 % IV SOLN
INTRAVENOUS | Status: DC
  Administered 2014-12-11 – 2014-12-13 (×2): via INTRAVENOUS

## 2014-12-11 MED ORDER — POTASSIUM CHLORIDE 10 MEQ/100ML IV SOLN
10.0000 meq | INTRAVENOUS | Status: AC
  Administered 2014-12-11 (×3): 10 meq via INTRAVENOUS
  Filled 2014-12-11 (×3): qty 100

## 2014-12-11 NOTE — Progress Notes (Signed)
NP returned page with new orders for 3 runs of IV KCL 10 meq each.  First run infusing now.  Will continue to monitor.

## 2014-12-11 NOTE — Progress Notes (Signed)
Pt converted to SB/SR on tele, IV amio at 30 mg/hr.  EKG obtained.  Cards notified of rhythm change and positive troponin.  Will continue to monitor closely.

## 2014-12-11 NOTE — Progress Notes (Signed)
ANTICOAGULATION CONSULT NOTE - Initial Consult  Pharmacy Consult for Heparin Indication: atrial fibrillation  Allergies  Allergen Reactions  . Avelox [Moxifloxacin Hcl In Nacl] Other (See Comments)    Mental breakdown.  [name] Amoxicillin Hives  . Hydrocodone Itching  . Oxycodone Itching  . Penicillins Hives  . Sulfa Antibiotics Other (See Comments)    unknown    Patient Measurements: Height:  (162.6 cm) Weight: 150 lb (68.04 kg) IBW/kg (Calculated) : 54.7 Heparin Dosing Weight: 68 kg  Vital Signs: Temp: 98.2 F (36.8 C) (04/03 1430) Temp Source: Oral (04/03 1430) BP: 88/63 mmHg (04/03 0608) Pulse Rate: 58 (04/03 0613)  Labs:  Recent Labs  12/10/14 1420 12/10/14 1435 12/10/14 1812 12/10/14 1928 12/10/14 2325 12/11/14 0351 12/11/14 1135 12/11/14 1315  HGB 14.1 14.6  --   --   --  10.9* 10.2* 10.7*  HCT 39.9 43.0  --   --   --  31.1* 29.8* 30.6*  PLT 255  --   --   --   --  169  --   --   CREATININE 1.83* 1.70*  --  1.54*  --  1.45*  --   --   TROPONINI  --   --  0.19*  --  0.25* 0.22*  --   --     Estimated Creatinine Clearance: 40.5 mL/min (by C-G formula based on Cr of 1.45).   Medical History: Past Medical History  Diagnosis Date  . CAD (coronary artery disease)     a.  cath 09/2010: LAD stent patent, S-Int/dCFX ok, S-PDA ok, L-LAD atretic;  b. Lexiscan Myoview (02/2014):  no ischemia, EF 55%; c. 11/2014 Cath/PCI: LM nl, LAD 20pISR, LCX 80-26m, OM1 nl, RI 70p, RCA 40-14m, RPDA 95ost/95-26m (PTCA only w/ reduction to 50p/70m), 60d, L->LAD atretic, VG->RI->OM nl, VG->PDA 100p.  [name] Hypertension   . Hx of CABG   . Hx of transesophageal echocardiography (TEE) for monitoring 11/2010    TEE 11/2010: EF 60-65%, BAE, trivial atrial septal shunt;  right heart cath in 10/2010 with elevated R and L heart pressures and diuretic started  . HLD (hyperlipidemia)   . Hypothyroidism   . COPD (chronic obstructive pulmonary disease)   . Pulmonary nodules     repeat CT due in  11/2011  . Acute right MCA stroke 11/07/10  . 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 Eczema   . Depression   . Atrial fibrillation     a. s/p TEE-DCCV 02/2104; b. Xarelto started  . Cardiomyopathy with EF 40% at TEE 02/17/14 (likely tachycardia mediated - Myoview 02/19/14 neg for ischemia with normal EF) 02/18/2014  . 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 Sleep apnea     recent sleep study  in 04/2014 per chart review  shows no significant OSA  . Dysrhythmia 10/2014    atrial fib with rvr  . GERD (gastroesophageal reflux disease)   . Headache    Assessment:  On Xarelto 20 mg daily prior to admission for afib.  Now converting to IV heparin due to elevated troponins and needs cardiac cath.  Last Xarelto dose ~9pm on 12/10/14.  IV heparin to begin ~ 24hrs after last dose.  Will need to check aPTTs and heparin levels, as recent Xarelto dose will effect heparin levels.  Goal of Therapy:  Heparin level 0.3-0.7 units/ml aPTT 66-102  seconds Monitor platelets by anticoagulation protocol:  Yes   Plan:   Begin heparin drip at 950 units/hr at ~9pm tonight.  Heparin level, aPTT and CBC in am and daily.  Dennie Fetters, [place] Pager: [phone] 12/11/2014,4:46 PM

## 2014-12-11 NOTE — Progress Notes (Signed)
Pt Troponin 0.19 / 0.25, pt denies pain, last K 2.5 prior to 40 meq PO x 2.  Lenny Pastel notified via text.  Pt currently Afib 92-100.

## 2014-12-11 NOTE — Progress Notes (Signed)
Patient refused CPAP for the night. Sp02=95% on room air at this time

## 2014-12-11 NOTE — Progress Notes (Signed)
UR Completed.  [phone]  

## 2014-12-11 NOTE — Progress Notes (Signed)
SUBJECTIVE:  She is not having chest pain.  Breathing OK   PHYSICAL EXAM Filed Vitals:   12/10/14 2300 12/11/14 0207 12/11/14 0608 12/11/14 0613  BP:   Pulse:    58  Temp:    98.1 F (36.7 C)  TempSrc:    Oral  Resp:    20  Height:     (1.626 m)  Weight:    150 lb (68.04 kg)  SpO2:    98%   General:  No distress Lungs:  Clear Heart:  RRR Abdomen:  Positive bowel sounds, no rebound no guarding Extremities:  No edema Neuro:  nonfocal  LABS: Lab Results  Component Value Date   TROPONINI 0.22* 12/11/2014   Results for orders placed or performed during the hospital encounter of 12/10/14 (from the past 24 hour(s))  CBC with Differential     Status: None   Collection Time: 12/10/14  2:20 PM  Result Value Ref Range   WBC 8.2 4.0 - 10.5 K/uL   RBC 4.39 3.87 - 5.11 MIL/uL   Hemoglobin 14.1 12.0 - 15.0 g/dL   HCT 78.2 95.6 - 21.3 %   MCV 90.9 78.0 - 100.0 fL   MCH 32.1 26.0 - 34.0 pg   MCHC 35.3 30.0 - 36.0 g/dL   RDW 08.6 57.8 - 46.9 %   Platelets 255 150 - 400 K/uL   Neutrophils Relative % 70 43 - 77 %   Neutro Abs 5.7 1.7 - 7.7 K/uL   Lymphocytes Relative 20 12 - 46 %   Lymphs Abs 1.6 0.7 - 4.0 K/uL   Monocytes Relative 7 3 - 12 %   Monocytes Absolute 0.6 0.1 - 1.0 K/uL   Eosinophils Relative 3 0 - 5 %   Eosinophils Absolute 0.3 0.0 - 0.7 K/uL   Basophils Relative 0 0 - 1 %   Basophils Absolute 0.0 0.0 - 0.1 K/uL  Comprehensive metabolic panel     Status: Abnormal   Collection Time: 12/10/14  2:20 PM  Result Value Ref Range   Sodium 139 135 - 145 mmol/L   Potassium 2.4 (LL) 3.5 - 5.1 mmol/L   Chloride 100 96 - 112 mmol/L   CO2 23 19 - 32 mmol/L   Glucose, Bld 133 (H) 70 - 99 mg/dL   BUN 26 (H) 6 - 23 mg/dL   Creatinine, Ser 6.29 (H) 0.50 - 1.10 mg/dL   Calcium 9.5 8.4 - 52.8 mg/dL   Total Protein 8.0 6.0 - 8.3 g/dL   Albumin 4.1 3.5 - 5.2 g/dL   AST 18 0 - 37 U/L   ALT 10 0 - 35 U/L   Alkaline Phosphatase 87 39 - 117 U/L   Total  Bilirubin 0.7 0.3 - 1.2 mg/dL   GFR calc non Af Amer 30 (L) >90 mL/min   GFR calc Af Amer 34 (L) >90 mL/min   Anion gap 16 (H) 5 - 15  TSH     Status: None   Collection Time: 12/10/14  2:30 PM  Result Value Ref Range   TSH 3.109 0.350 - 4.500 uIU/mL  I-stat troponin, ED     Status: None   Collection Time: 12/10/14  2:33 PM  Result Value Ref Range   Troponin i, poc 0.05 0.00 - 0.08 ng/mL   Comment 3          I-stat Chem 8, ED     Status: Abnormal   Collection Time: 12/10/14  2:35 PM  Result Value Ref Range   Sodium 141 135 - 145 mmol/L   Potassium 2.4 (LL) 3.5 - 5.1 mmol/L   Chloride 100 96 - 112 mmol/L   BUN 27 (H) 6 - 23 mg/dL   Creatinine, Ser 3.81 (H) 0.50 - 1.10 mg/dL   Glucose, Bld 829 (H) 70 - 99 mg/dL   Calcium, Ion 9.37 (L) 1.12 - 1.23 mmol/L   TCO2 20 0 - 100 mmol/L   Hemoglobin 14.6 12.0 - 15.0 g/dL   HCT 16.9 67.8 - 93.8 %  Magnesium     Status: None   Collection Time: 12/10/14  6:12 PM  Result Value Ref Range   Magnesium 1.5 1.5 - 2.5 mg/dL  Troponin I (q 6hr x 3)     Status: Abnormal   Collection Time: 12/10/14  6:12 PM  Result Value Ref Range   Troponin I 0.19 (H) <0.031 ng/mL  Basic metabolic panel     Status: Abnormal   Collection Time: 12/10/14  7:28 PM  Result Value Ref Range   Sodium 139 135 - 145 mmol/L   Potassium 2.5 (LL) 3.5 - 5.1 mmol/L   Chloride 103 96 - 112 mmol/L   CO2 27 19 - 32 mmol/L   Glucose, Bld 85 70 - 99 mg/dL   BUN 24 (H) 6 - 23 mg/dL   Creatinine, Ser 1.01 (H) 0.50 - 1.10 mg/dL   Calcium 8.2 (L) 8.4 - 10.5 mg/dL   GFR calc non Af Amer 36 (L) >90 mL/min   GFR calc Af Amer 42 (L) >90 mL/min   Anion gap 9 5 - 15  MRSA PCR Screening     Status: None   Collection Time: 12/10/14  9:15 PM  Result Value Ref Range   MRSA by PCR NEGATIVE NEGATIVE  Urinalysis, Routine w reflex microscopic     Status: None   Collection Time: 12/10/14 11:19 PM  Result Value Ref Range   Color, Urine YELLOW YELLOW   APPearance CLEAR CLEAR   Specific  Gravity, Urine 1.010 1.005 - 1.030   pH 6.5 5.0 - 8.0   Glucose, UA NEGATIVE NEGATIVE mg/dL   Hgb urine dipstick NEGATIVE NEGATIVE   Bilirubin Urine NEGATIVE NEGATIVE   Ketones, ur NEGATIVE NEGATIVE mg/dL   Protein, ur NEGATIVE NEGATIVE mg/dL   Urobilinogen, UA 0.2 0.0 - 1.0 mg/dL   Nitrite NEGATIVE NEGATIVE   Leukocytes, UA NEGATIVE NEGATIVE  Troponin I (q 6hr x 3)     Status: Abnormal   Collection Time: 12/10/14 11:25 PM  Result Value Ref Range   Troponin I 0.25 (H) <0.031 ng/mL  Troponin I (q 6hr x 3)     Status: Abnormal   Collection Time: 12/11/14  3:51 AM  Result Value Ref Range   Troponin I 0.22 (H) <0.031 ng/mL  Magnesium     Status: Abnormal   Collection Time: 12/11/14  3:51 AM  Result Value Ref Range   Magnesium 2.7 (H) 1.5 - 2.5 mg/dL  Basic metabolic panel     Status: Abnormal   Collection Time: 12/11/14  3:51 AM  Result Value Ref Range   Sodium 140 135 - 145 mmol/L   Potassium 2.8 (L) 3.5 - 5.1 mmol/L   Chloride 108 96 - 112 mmol/L   CO2 24 19 - 32 mmol/L   Glucose, Bld 104 (H) 70 - 99 mg/dL   BUN 20 6 - 23 mg/dL   Creatinine, Ser 7.51 (H) 0.50 - 1.10 mg/dL   Calcium 8.1 (L) 8.4 -  10.5 mg/dL   GFR calc non Af Amer 39 (L) >90 mL/min   GFR calc Af Amer 45 (L) >90 mL/min   Anion gap 8 5 - 15  CBC     Status: Abnormal   Collection Time: 12/11/14  3:51 AM  Result Value Ref Range   WBC 6.2 4.0 - 10.5 K/uL   RBC 3.42 (L) 3.87 - 5.11 MIL/uL   Hemoglobin 10.9 (L) 12.0 - 15.0 g/dL   HCT 16.1 (L) 09.6 - 04.5 %   MCV 90.9 78.0 - 100.0 fL   MCH 31.9 26.0 - 34.0 pg   MCHC 35.0 30.0 - 36.0 g/dL   RDW 40.9 81.1 - 91.4 %   Platelets 169 150 - 400 K/uL    Intake/Output Summary (Last 24 hours) at 12/11/14 0738 Last data filed at 12/11/14 0600  Gross per 24 hour  Intake 2451.04 ml  Output    725 ml  Net 1726.04 ml     ASSESSMENT AND PLAN:  ATRIAL FIB:  Converted to sinus at 00:50.  Convert to PO amiodarone.  She is on triple therapy with recent stent and a  history of strokes.    CHEST PAIN:  Given elevated troponin and results of prevous cath she will needd a repeat cath. However, the films should be reviewed with the interventionalists first.  I will keep her NPO after MN and hold the Xarelto and use heparin.     CKD:  Hydrate overnight in anticipation that she might need a cath.    HYPOKALEMIA:  Supplement.    Rollene Rotunda 12/11/2014 7:38 AM

## 2014-12-11 NOTE — Progress Notes (Signed)
Pt K 2.8, Lenny Pastel, NP notified via text page.  Awaiting return call.

## 2014-12-11 NOTE — Progress Notes (Signed)
[facility] PROGRESS NOTE  [name] NLG:[id number] DOB: Aug 26, 1956 DOA: 12/10/2014 PCP: [name], MD  Assessment/Plan:  Atrial fibrillation with RVR -Cont with attempted rate control with amiodarone given patient's soft blood pressure readings -At this time not a candidate for cardioversion due to recent interruption in Xarelto therapy earlier this month -Cont to correct lytes as needed   Hypotension -Seems multifactorial related to increased ventricular rates as well as volume depletion based on review of patient's lab data -Holding bp meds -BP remains soft   Hypokalemia -Replaced -Patient reports noncompliance with potassium in setting of regular use of Lasix -Follow lytes and cont to correct as needed   Hypothyroidism -TSH was normal during previous admission earlier in the month so no indication to repeat   Chronic diastolic heart failure -Currently compensated but monitor closely with administration of IV fluids in setting of rapid ventricular response -Most recent chest x-ray without edema   Coronary artery disease s/p CABG and prior PCI to LAD -On admit, pt reported chest heaviness and upper arm heaviness -Cardiology following -Cycle troponin. Peak trop 0.25, trending down -possibility of heart cath per Cardiology   Cardiomyopathy with EF 40% at TEE 02/17/14 (likely tachycardia mediated - Myoview 02/19/14 neg for ischemia with normal EF) -Repeat echocardiogram 11/08/2014 shows improvement in ejection fraction up to 65-70%   HTN  -Current blood pressure soft so hold any offending medications   Acute renal failure on CKD stage III -Appears to have minor acute renal insufficiency likely related to low perfusion from tachycardia as well as poor oral intake and setting of diuretics -IV fluid as above   Chronic urinary retention -Continue Flomax -Still having difficulty voiding so check bladder scan-in/out catheterization if needed   OSA   -Follow -Provide CPAP if patient desires   Hyperlipidemia -Continue statin therapy -Note on medication reconciliation from pharmacist documents patient reports has difficulty swallowing medication   History of right MCA stroke -Currently neurologically stable   Pulmonary hypertension/COPD/Tobacco use disorder -Currently without wheezing and appears compensated -Cessation counseling regarding tobacco abuse provided -Nicotine patch if needed-will not apply now given RVR  Code Status: Full Family Communication: Pt in room (indicate person spoken with, relationship, and if by phone, the number) Disposition Plan: Pending   Consultants:  Cardiology  Procedures:    Antibiotics:  none (indicate start date, and stop date if known)  HPI/Subjective: No complaints.  Objective: Filed Vitals:   12/11/14 0207 12/11/14 0608 12/11/14 0613 12/11/14 1430  BP: 90/65 88/63    Pulse:   58   Temp:   98.1 F (36.7 C) 98.2 F (36.8 C)  TempSrc:   Oral Oral  Resp:   20   Height:   5\' 4"  (1.626 m)   Weight:   68.04 kg (150 lb)   SpO2:   98%     Intake/Output Summary (Last 24 hours) at 12/11/14 1458 Last data filed at 12/11/14 0931  Gross per 24 hour  Intake 2454.04 ml  Output    725 ml  Net 1729.04 ml   Filed Weights   12/10/14 1433 12/11/14 0613  Weight: 68.04 kg (150 lb) 68.04 kg (150 lb)    Exam:   General:  Awake, in nad  Cardiovascular: regular, s1, s2  Respiratory: normal resp effort, no wheezing  Abdomen: soft, nondistended  Musculoskeletal: perfused, no clubbing   Data Reviewed: Basic Metabolic Panel:  Recent Labs Lab 12/10/14 1420 12/10/14 1435 12/10/14 1812 12/10/14 1928 12/11/14 0351  NA 139 141  --  139 140  K 2.4* 2.4*  --  2.5* 2.8*  CL 100 100  --  103 108  CO2 23  --   --  27 24  GLUCOSE 133* 141*  --  85 104*  BUN 26* 27*  --  24* 20  CREATININE 1.83* 1.70*  --  1.54* 1.45*  CALCIUM 9.5  --   --  8.2* 8.1*  MG  --   --  1.5  --   2.7*   Liver Function Tests:  Recent Labs Lab 12/10/14 1420  AST 18  ALT 10  ALKPHOS 87  BILITOT 0.7  PROT 8.0  ALBUMIN 4.1   No results for input(s): LIPASE, AMYLASE in the last 168 hours. No results for input(s): AMMONIA in the last 168 hours. CBC:  Recent Labs Lab 12/10/14 1420 12/10/14 1435 12/11/14 0351 12/11/14 1135 12/11/14 1315  WBC 8.2  --  6.2  --   --   NEUTROABS 5.7  --   --   --   --   HGB 14.1 14.6 10.9* 10.2* 10.7*  HCT 39.9 43.0 31.1* 29.8* 30.6*  MCV 90.9  --  90.9  --   --   PLT 255  --  169  --   --    Cardiac Enzymes:  Recent Labs Lab 12/10/14 1812 12/10/14 2325 12/11/14 0351  TROPONINI 0.19* 0.25* 0.22*   BNP (last 3 results)  Recent Labs  10/11/14 0954 11/17/14 2107  BNP 213.0* 69.0    ProBNP (last 3 results)  Recent Labs  02/18/14 1205 03/03/14 1539 11/08/14 1247  PROBNP 1629.0* 644.7* 89.0    CBG: No results for input(s): GLUCAP in the last 168 hours.  Recent Results (from the past 240 hour(s))  MRSA PCR Screening     Status: None   Collection Time: 12/10/14  9:15 PM  Result Value Ref Range Status   MRSA by PCR NEGATIVE NEGATIVE Final    Comment:        The GeneXpert MRSA Assay (FDA approved for NASAL specimens only), is one component of a comprehensive MRSA colonization surveillance program. It is not intended to diagnose MRSA infection nor to guide or monitor treatment for MRSA infections.      Studies: Dg Chest Portable 1 View  12/10/2014   CLINICAL DATA:  Shortness of breath, weakness, chest heaviness  EXAM: PORTABLE CHEST - 1 VIEW  COMPARISON:  11/17/2014  FINDINGS: Evidence of CABG reidentified. Patient is rotated to the right. Heart size upper limits of normal. Mild prominence of the central pulmonary arteries with tapering may be seen with pulmonary arterial hypertension and is stable. No new focal pulmonary opacity. No pleural effusion. No acute osseous finding.  IMPRESSION: Borderline cardiomegaly  without acute focal finding.   Electronically Signed   By: Christiana Pellant M.D.   On: 12/10/2014 14:53    Scheduled Meds: . amiodarone  400 mg Oral Daily  . aspirin EC  81 mg Oral Daily  . atorvastatin  40 mg Oral q1800  . buPROPion  300 mg Oral Daily  . clopidogrel  75 mg Oral Q breakfast  . feeding supplement (ENSURE ENLIVE)  237 mL Oral TID BM  . ferrous sulfate  325 mg Oral Q breakfast  . folic acid  1 mg Oral Daily  . levothyroxine  125 mcg Oral Daily  . LORazepam  0.5-1 mg Oral BID  . multivitamin with minerals  1 tablet Oral Daily  . potassium chloride  40 mEq Oral BID  .  sodium chloride  3 mL Intravenous Q12H  . tamsulosin  0.4 mg Oral Daily  . thiamine  100 mg Oral Daily  . topiramate  50 mg Oral Daily   And  . topiramate  100 mg Oral QHS   Continuous Infusions: . sodium chloride      Principal Problem:   Atrial fibrillation with RVR Active Problems:   Hypothyroidism   OSA (obstructive sleep apnea)   Chronic diastolic heart failure   Coronary artery disease s/p CABG and prior PCI to LAD   Cardiomyopathy-h/o tachycardia mediated-EF 65% per echo March 2016   Hyperlipidemia   Hypotension   History of right MCA stroke   Pulmonary hypertension   COPD (chronic obstructive pulmonary disease)   HTN (hypertension)   Tobacco use disorder   Hypokalemia   Coronary artery disease involving autologous vein coronary bypass graft without angina pectoris   CKD (chronic kidney disease), stage III   CHIU, STEPHEN K  [facility] Pager [phone]. If 7PM-7AM, please contact night-coverage at [email or web address], password [facility] 12/11/2014, 2:58 PM  LOS: 1 day

## 2014-12-12 ENCOUNTER — Encounter: Payer: Medicare Other | Admitting: Physician Assistant

## 2014-12-12 ENCOUNTER — Encounter: Payer: Self-pay | Admitting: Physician Assistant

## 2014-12-12 DIAGNOSIS — E785 Hyperlipidemia, unspecified: Secondary | ICD-10-CM

## 2014-12-12 LAB — URINE CULTURE
Colony Count: NO GROWTH
Culture: NO GROWTH

## 2014-12-12 LAB — HEMOGLOBIN AND HEMATOCRIT, BLOOD
HCT: 28.1 % — ABNORMAL LOW (ref 36.0–46.0)
HCT: 28 % — ABNORMAL LOW (ref 36.0–46.0)
HEMOGLOBIN: 9.7 g/dL — AB (ref 12.0–15.0)
Hemoglobin: 9.7 g/dL — ABNORMAL LOW (ref 12.0–15.0)

## 2014-12-12 LAB — URINALYSIS, ROUTINE W REFLEX MICROSCOPIC
BILIRUBIN URINE: NEGATIVE
Glucose, UA: NEGATIVE mg/dL
Ketones, ur: NEGATIVE mg/dL
Nitrite: NEGATIVE
PH: 6 (ref 5.0–8.0)
Protein, ur: 100 mg/dL — AB
Specific Gravity, Urine: 1.007 (ref 1.005–1.030)
UROBILINOGEN UA: 0.2 mg/dL (ref 0.0–1.0)

## 2014-12-12 LAB — CBC
HCT: 27 % — ABNORMAL LOW (ref 36.0–46.0)
Hemoglobin: 9.5 g/dL — ABNORMAL LOW (ref 12.0–15.0)
MCH: 32.2 pg (ref 26.0–34.0)
MCHC: 35.2 g/dL (ref 30.0–36.0)
MCV: 91.5 fL (ref 78.0–100.0)
PLATELETS: 135 10*3/uL — AB (ref 150–400)
RBC: 2.95 MIL/uL — AB (ref 3.87–5.11)
RDW: 13.2 % (ref 11.5–15.5)
WBC: 4 10*3/uL (ref 4.0–10.5)

## 2014-12-12 LAB — URINE MICROSCOPIC-ADD ON

## 2014-12-12 LAB — BASIC METABOLIC PANEL
Anion gap: 7 (ref 5–15)
BUN: 13 mg/dL (ref 6–23)
CALCIUM: 8.5 mg/dL (ref 8.4–10.5)
CO2: 22 mmol/L (ref 19–32)
Chloride: 113 mmol/L — ABNORMAL HIGH (ref 96–112)
Creatinine, Ser: 1.23 mg/dL — ABNORMAL HIGH (ref 0.50–1.10)
GFR calc Af Amer: 55 mL/min — ABNORMAL LOW (ref >90)
GFR, EST NON AFRICAN AMERICAN: 48 mL/min — AB (ref >90)
Glucose, Bld: 80 mg/dL (ref 70–99)
Potassium: 3.4 mmol/L — ABNORMAL LOW (ref 3.5–5.1)
SODIUM: 142 mmol/L (ref 135–145)

## 2014-12-12 LAB — APTT: aPTT: 32 seconds (ref 24–37)

## 2014-12-12 MED ORDER — SPIRONOLACTONE 25 MG PO TABS
12.5000 mg | ORAL_TABLET | Freq: Every day | ORAL | Status: DC
  Administered 2014-12-12 – 2014-12-14 (×3): 12.5 mg via ORAL
  Filled 2014-12-12 (×3): qty 1

## 2014-12-12 MED ORDER — MIRABEGRON ER 25 MG PO TB24
25.0000 mg | ORAL_TABLET | Freq: Every day | ORAL | Status: DC
  Administered 2014-12-12 – 2014-12-14 (×2): 25 mg via ORAL
  Filled 2014-12-12 (×3): qty 1

## 2014-12-12 NOTE — Care Management Note (Addendum)
    Page 1 of 1   12/14/2014     3:04:33 PM CARE MANAGEMENT NOTE 12/14/2014  Patient:  [name], [name]   Account Number:  [id number]  Date Initiated:  12/12/2014  Documentation initiated by:  GRAVES-BIGELOW,Tywanna Seifer  Subjective/Objective Assessment:   Pt admitted for Afib RVR. Pt initiated on Iv amio gtt and transitioned to po.     Action/Plan:   Benefits check placed for amiodarone and xarelto. CM will make pt aware once completed.   Anticipated DC Date:  12/13/2014   Anticipated DC Plan:  HOME/SELF CARE      DC Planning Services  CM consult      Choice offered to / List presented to:             Status of service:  Completed, signed off Medicare Important Message given?  YES (If response is "NO", the following Medicare IM given date fields will be blank) Date Medicare IM given:  12/14/2014 Medicare IM given by:  GRAVES-BIGELOW,Jadakiss Barish Date Additional Medicare IM given:   Additional Medicare IM given by:    Discharge Disposition:  HOME/SELF CARE  Per UR Regulation:  Reviewed for med. necessity/level of care/duration of stay  If discussed at Long Length of Stay Meetings, dates discussed:   12/15/2014    Comments:  12-14-14 [address] RN,BSN [phone] CM did call [facility] and xarelto is available. Co pay of 86.48. CM will make pt aware. CM did call to [facility] to see if samples were available for pt. CM was able to get pt 2 wk sample and she is to go to office to pick up samples. Pt will also fill out patient assistance form to see if qualifies for year supply. Pt to call office back next Tuesday to see if more samples available.  Pt stated will not need HH RN services at this time.  No further needs at this time.

## 2014-12-12 NOTE — Progress Notes (Signed)
Late entry:  IV heparin initiated per orders at 9.5 ml/hr at 2100 per MD orders.  In and Out cath performed as well using sterile technique.  No resistance on entry, pt tolerated well.  Amber urine immediate outflow.  After approx. 400 cc amber urine, dark red urine began to flow.  Pt denied any pain or discomfort.  Total of 725 cc urine obtained.  Lenny Pastel, NP notified with orders to insert foley cath and obtain urine specimen.  Dr. Dorris Fetch notified regarding bleeding and heparin initiation.  Orders to stop heparin received.  IV heparin stopped and explained to patient MD new orders.  Pt request to have ativan and delay foley cath insertion until medication administered.  Will continue to monitor.

## 2014-12-12 NOTE — Progress Notes (Signed)
Patient Name: [name] Date of Encounter: 12/12/2014  Principal Problem:   Atrial fibrillation with RVR Active Problems:   Hypothyroidism   OSA (obstructive sleep apnea)   Chronic diastolic heart failure   Coronary artery disease s/p CABG and prior PCI to LAD   Cardiomyopathy-h/o tachycardia mediated-EF 65% per echo March 2016   Hyperlipidemia   Hypotension   History of right MCA stroke   Pulmonary hypertension   COPD (chronic obstructive pulmonary disease)   HTN (hypertension)   Tobacco use disorder   Hypokalemia   Coronary artery disease involving autologous vein coronary bypass graft without angina pectoris   CKD (chronic kidney disease), stage III   Length of Stay: 2  SUBJECTIVE  Feeling well, no angina. NSR  CURRENT MEDS . amiodarone  400 mg Oral Daily  . aspirin EC  81 mg Oral Daily  . atorvastatin  40 mg Oral q1800  . buPROPion  300 mg Oral Daily  . clopidogrel  75 mg Oral Q breakfast  . feeding supplement (ENSURE ENLIVE)  237 mL Oral TID BM  . ferrous sulfate  325 mg Oral Q breakfast  . folic acid  1 mg Oral Daily  . levothyroxine  125 mcg Oral Daily  . LORazepam  0.5-1 mg Oral BID  . multivitamin with minerals  1 tablet Oral Daily  . potassium chloride  40 mEq Oral BID  . sodium chloride  3 mL Intravenous Q12H  . tamsulosin  0.4 mg Oral Daily  . thiamine  100 mg Oral Daily  . topiramate  50 mg Oral Daily   And  . topiramate  100 mg Oral QHS    OBJECTIVE   Intake/Output Summary (Last 24 hours) at 12/12/14 1405 Last data filed at 12/12/14 1227  Gross per 24 hour  Intake      0 ml  Output    375 ml  Net   -375 ml   Filed Weights   12/10/14 1433 12/11/14 0613 12/12/14 0400  Weight: 150 lb (68.04 kg) 150 lb (68.04 kg) 151 lb (68.493 kg)    PHYSICAL EXAM Filed Vitals:   12/12/14 0838 12/12/14 0842 12/12/14 1053 12/12/14 1225  BP: 91/58 90/58 103/73 108/66  Pulse:    57  Temp:    97.7 F (36.5 C)  TempSrc:    Oral  Resp: 14  22 19    Height:      Weight:      SpO2:    100%   General: Alert, oriented x3, no distress Head: no evidence of trauma, PERRL, EOMI, no exophtalmos or lid lag, no myxedema, no xanthelasma; normal ears, nose and oropharynx Neck: normal jugular venous pulsations and no hepatojugular reflux; brisk carotid pulses without delay and no carotid bruits Chest: clear to auscultation, no signs of consolidation by percussion or palpation, normal fremitus, symmetrical and full respiratory excursions Cardiovascular: normal position and quality of the apical impulse, regular rhythm, normal first and second heart sounds, no rubs or gallops, no murmur Abdomen: no tenderness or distention, no masses by palpation, no abnormal pulsatility or arterial bruits, normal bowel sounds, no hepatosplenomegaly Extremities: no clubbing, cyanosis or edema; 2+ radial, ulnar and brachial pulses bilaterally; 2+ right femoral, posterior tibial and dorsalis pedis pulses; 2+ left femoral, posterior tibial and dorsalis pedis pulses; no subclavian or femoral bruits Neurological: grossly nonfocal  LABS  CBC  Recent Labs  12/10/14 1420  12/11/14 0351  12/11/14 2240 12/12/14 0523  WBC 8.2  --  6.2  --   --  4.0  NEUTROABS 5.7  --   --   --   --   --   HGB 14.1  < > 10.9*  < > 10.1* 9.5*  HCT 39.9  < > 31.1*  < > 29.2* 27.0*  MCV 90.9  --  90.9  --   --  91.5  PLT 255  --  169  --   --  135*  < > = values in this interval not displayed. Basic Metabolic Panel  Recent Labs  12/10/14 1812  12/11/14 0351 12/12/14 0523  NA  --   < > 140 142  K  --   < > 2.8* 3.4*  CL  --   < > 108 113*  CO2  --   < > 24 22  GLUCOSE  --   < > 104* 80  BUN  --   < > 20 13  CREATININE  --   < > 1.45* 1.23*  CALCIUM  --   < > 8.1* 8.5  MG 1.5  --  2.7*  --   < > = values in this interval not displayed. Liver Function Tests  Recent Labs  12/10/14 1420  AST 18  ALT 10  ALKPHOS 87  BILITOT 0.7  PROT 8.0  ALBUMIN 4.1   No results for  input(s): LIPASE, AMYLASE in the last 72 hours. Cardiac Enzymes  Recent Labs  12/10/14 1812 12/10/14 2325 12/11/14 0351  TROPONINI 0.19* 0.25* 0.22*    Recent Labs  12/10/14 1430  TSH 3.109    Radiology Studies Imaging results have been reviewed and Dg Chest Portable 1 View  12/10/2014   CLINICAL DATA:  Shortness of breath, weakness, chest heaviness  EXAM: PORTABLE CHEST - 1 VIEW  COMPARISON:  11/17/2014  FINDINGS: Evidence of CABG reidentified. Patient is rotated to the right. Heart size upper limits of normal. Mild prominence of the central pulmonary arteries with tapering may be seen with pulmonary arterial hypertension and is stable. No new focal pulmonary opacity. No pleural effusion. No acute osseous finding.  IMPRESSION: Borderline cardiomegaly without acute focal finding.   Electronically Signed   By: Christiana Pellant M.D.   On: 12/10/2014 14:53    TELE NSR last 24 h  ECG Afib with RVR and marked inferior ST depression  ASSESSMENT AND PLAN  1. Symptomatic paroxysmal atrial fibrillation - likely triggered by hypokalemia, back in NSR. CHADSVASC score of 5. High risk for embolic events, currently on "triple therapy". Drop Aspirin since now 30 days post attempted PCI.  2. CAD s/p CABG and s/p attempted PCI-RCA - incomplete dilation of angioplasty balloon in the RCA with significant residual stenosis. It is not a surprise that she would have angina during tachyarrhythmia. Repeat cath unlikely to help. Manage medically. Low BP limits antianginals. Ranexa would be helpful for both angina and AFib. Wait for normalization of K and QT first  3. Recurrent hypokalemia  - consider evaluation for renal artery stenosis, but this can be done as outpt. Add very low dose spironolactone and monitor BUN/creat.  4. 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 If tolerated, spironolactone will help this as well.  Thurmon Fair, MD, [facility] [phone] office [phone]  pager 12/12/2014 2:05 PM

## 2014-12-12 NOTE — Progress Notes (Signed)
This encounter was created in error - please disregard.

## 2014-12-12 NOTE — Progress Notes (Signed)
[facility] PROGRESS NOTE  [name] RUE:[id number] DOB: 1956-07-22 DOA: 12/10/2014 PCP: Tina Harp, MD  Assessment/Plan:  Atrial fibrillation with RVR -Continued on amiodarone for rate control -Likely development of RVR secondary to hypokalemia, see below -Cont to correct lytes as needed -Hold ASA as it has been 30 days since PCI   Hypotension -Seems multifactorial related to increased ventricular rates as well as volume depletion based on review of patient's lab data -Holding bp meds -BP remains soft   Hypokalemia -Replaced -Patient reports noncompliance with potassium in setting of regular use of Lasix -Follow lytes and cont to correct as needed   Hypothyroidism -TSH was normal during previous admission earlier in the month so no indication to repeat   Chronic diastolic heart failure -Currently compensated but monitor closely with administration of IV fluids in setting of rapid ventricular response -Most recent chest x-ray without edema   Coronary artery disease s/p CABG and prior PCI to LAD -On admit, pt reported chest heaviness and upper arm heaviness -Cardiology following -Cycle troponin. Peak trop 0.25, trending down -Recommendations for medical management. Recs for Ranexa once [name] and QT normalized   Cardiomyopathy with EF 40% at TEE 02/17/14 (likely tachycardia mediated - Myoview 02/19/14 neg for ischemia with normal EF) -Repeat echocardiogram 11/08/2014 shows improvement in ejection fraction up to 65-70%   HTN  -Current blood pressure remains soft so hold any offending medications   Acute renal failure on CKD stage III -Appears to have minor acute renal insufficiency likely related to low perfusion from tachycardia as well as poor oral intake and setting of diuretics -IV fluid as above   Chronic urinary retention -Continue Flomax -Indwelling foley in place   OSA  -Follow -Provide CPAP if patient desires    Hyperlipidemia -Continue statin therapy -Note on medication reconciliation from pharmacist documents patient reports has difficulty swallowing medication   History of right MCA stroke -Currently neurologically stable   Pulmonary hypertension/COPD/Tobacco use disorder -Currently without wheezing and appears compensated -Cessation counseling regarding tobacco abuse provided -Nicotine patch if needed-will not apply now given RVR  Hematuria - Noted overnight while undergoing i/o cath while on anticoagulation - Hematuria improving with indwelling foley - H/h is remaining stable  Bladder spasm - discussed with pharmacy - Myrbetriq appears to be the best option at this time, as other regimen such as oxybutynin may worsen tachyarrhythmias - Will start a trial of myrbetriq  Code Status: Full Family Communication: Pt in room Disposition Plan: Pending   Consultants:  Cardiology  Procedures:    Antibiotics:  none (indicate start date, and stop date if known)  HPI/Subjective: No acute events noted. Reports bladder discomfort with foley in place.  Objective: Filed Vitals:   12/12/14 0838 12/12/14 0842 12/12/14 1053 12/12/14 1225  BP: 91/58 90/58 103/73 108/66  Pulse:    57  Temp:    97.7 F (36.5 C)  TempSrc:    Oral  Resp: Height:      Weight:      SpO2:    100%    Intake/Output Summary (Last 24 hours) at 12/12/14 1721 Last data filed at 12/12/14 1555  Gross per 24 hour  Intake      0 ml  Output    500 ml  Net   -500 ml   Filed Weights   12/10/14 1433 12/11/14 0613 12/12/14 0400  Weight: 68.04 kg (150 lb) 68.04 kg (150 lb) 68.493 kg (151 lb)    Exam:  General:  Awake, in nad  Cardiovascular: regular, s1, s2  Respiratory: normal resp effort, no wheezing  Abdomen: soft, nondistended  Musculoskeletal: perfused, no clubbing   Data Reviewed: Basic Metabolic Panel:  Recent Labs Lab 12/10/14 1420 12/10/14 1435 12/10/14 1812  12/10/14 1928 12/11/14 0351 12/12/14 0523  NA 139 141  --  139 140 142  [name] 2.4* 2.4*  --  2.5* 2.8* 3.4*  CL 100 100  --  103 108 113*  CO2 23  --   --  GLUCOSE 133* 141*  --  85 104* 80  BUN 26* 27*  --  24* 20 13  CREATININE 1.83* 1.70*  --  1.54* 1.45* 1.23*  CALCIUM 9.5  --   --  8.2* 8.1* 8.5  MG  --   --  1.5  --  2.7*  --    Liver Function Tests:  Recent Labs Lab 12/10/14 1420  AST 18  ALT 10  ALKPHOS 87  BILITOT 0.7  PROT 8.0  ALBUMIN 4.1   No results for input(s): LIPASE, AMYLASE in the last 168 hours. No results for input(s): AMMONIA in the last 168 hours. CBC:  Recent Labs Lab 12/10/14 1420  12/11/14 0351 12/11/14 1135 12/11/14 1315 12/11/14 1553 12/11/14 2240 12/12/14 0523  WBC 8.2  --  6.2  --   --   --   --  4.0  NEUTROABS 5.7  --   --   --   --   --   --   --   HGB 14.1  < > 10.9* 10.2* 10.7* 10.1* 10.1* 9.5*  HCT 39.9  < > 31.1* 29.8* 30.6* 28.4* 29.2* 27.0*  MCV 90.9  --  90.9  --   --   --   --  91.5  PLT 255  --  169  --   --   --   --  135*  < > = values in this interval not displayed. Cardiac Enzymes:  Recent Labs Lab 12/10/14 1812 12/10/14 2325 12/11/14 0351  TROPONINI 0.19* 0.25* 0.22*   BNP (last 3 results)  Recent Labs  10/11/14 0954 11/17/14 2107  BNP 213.0* 69.0    ProBNP (last 3 results)  Recent Labs  02/18/14 1205 03/03/14 1539 11/08/14 1247  PROBNP 1629.0* 644.7* 89.0    CBG: No results for input(s): GLUCAP in the last 168 hours.  Recent Results (from the past 240 hour(s))  MRSA PCR Screening     Status: None   Collection Time: 12/10/14  9:15 PM  Result Value Ref Range Status   MRSA by PCR NEGATIVE NEGATIVE Final    Comment:        The GeneXpert MRSA Assay (FDA approved for NASAL specimens only), is one component of a comprehensive MRSA colonization surveillance program. It is not intended to diagnose MRSA infection nor to guide or monitor treatment for MRSA infections.   Urine culture      Status: None   Collection Time: 12/10/14 11:19 PM  Result Value Ref Range Status   Specimen Description URINE, CATHETERIZED  Final   Special Requests NONE  Final   Colony Count NO GROWTH Performed at [facility]   Final   Culture NO GROWTH Performed at [facility]   Final   Report Status 12/12/2014 FINAL  Final     Studies: No results found.  Scheduled Meds: . amiodarone  400 mg Oral Daily  . atorvastatin  40 mg Oral q1800  .  buPROPion  300 mg Oral Daily  . clopidogrel  75 mg Oral Q breakfast  . feeding supplement (ENSURE ENLIVE)  237 mL Oral TID BM  . ferrous sulfate  325 mg Oral Q breakfast  . folic acid  1 mg Oral Daily  . levothyroxine  125 mcg Oral Daily  . LORazepam  0.5-1 mg Oral BID  . multivitamin with minerals  1 tablet Oral Daily  . potassium chloride  40 mEq Oral BID  . sodium chloride  3 mL Intravenous Q12H  . spironolactone  12.5 mg Oral Daily  . tamsulosin  0.4 mg Oral Daily  . thiamine  100 mg Oral Daily  . topiramate  50 mg Oral Daily   And  . topiramate  100 mg Oral QHS   Continuous Infusions: . sodium chloride 75 mL/hr at 12/11/14 2304    Principal Problem:   Atrial fibrillation with RVR Active Problems:   Hypothyroidism   OSA (obstructive sleep apnea)   Chronic diastolic heart failure   Coronary artery disease s/p CABG and prior PCI to LAD   Cardiomyopathy-h/o tachycardia mediated-EF 65% per echo March 2016   Hyperlipidemia   Hypotension   History of right MCA stroke   Pulmonary hypertension   COPD (chronic obstructive pulmonary disease)   HTN (hypertension)   Tobacco use disorder   Hypokalemia   Coronary artery disease involving autologous vein coronary bypass graft without angina pectoris   CKD (chronic kidney disease), stage III   [name]  [facility] Pager [phone]. If 7PM-7AM, please contact night-coverage at [email or web address], password [facility] 12/12/2014, 5:21 PM  LOS: 2 days

## 2014-12-13 DIAGNOSIS — Z7901 Long term (current) use of anticoagulants: Secondary | ICD-10-CM

## 2014-12-13 DIAGNOSIS — I248 Other forms of acute ischemic heart disease: Secondary | ICD-10-CM | POA: Diagnosis present

## 2014-12-13 DIAGNOSIS — I25708 Atherosclerosis of coronary artery bypass graft(s), unspecified, with other forms of angina pectoris: Secondary | ICD-10-CM

## 2014-12-13 LAB — CBC
HCT: 27.1 % — ABNORMAL LOW (ref 36.0–46.0)
Hemoglobin: 9.3 g/dL — ABNORMAL LOW (ref 12.0–15.0)
MCH: 32.1 pg (ref 26.0–34.0)
MCHC: 34.3 g/dL (ref 30.0–36.0)
MCV: 93.4 fL (ref 78.0–100.0)
Platelets: 133 10*3/uL — ABNORMAL LOW (ref 150–400)
RBC: 2.9 MIL/uL — ABNORMAL LOW (ref 3.87–5.11)
RDW: 13.1 % (ref 11.5–15.5)
WBC: 4.4 10*3/uL (ref 4.0–10.5)

## 2014-12-13 LAB — BASIC METABOLIC PANEL
Anion gap: 6 (ref 5–15)
BUN: 11 mg/dL (ref 6–23)
CO2: 18 mmol/L — ABNORMAL LOW (ref 19–32)
Calcium: 8.7 mg/dL (ref 8.4–10.5)
Chloride: 117 mmol/L — ABNORMAL HIGH (ref 96–112)
Creatinine, Ser: 1.19 mg/dL — ABNORMAL HIGH (ref 0.50–1.10)
GFR calc Af Amer: 58 mL/min — ABNORMAL LOW (ref >90)
GFR calc non Af Amer: 50 mL/min — ABNORMAL LOW (ref >90)
Glucose, Bld: 115 mg/dL — ABNORMAL HIGH (ref 70–99)
Potassium: 4.3 mmol/L (ref 3.5–5.1)
Sodium: 141 mmol/L (ref 135–145)

## 2014-12-13 LAB — MAGNESIUM: Magnesium: 1.5 mg/dL (ref 1.5–2.5)

## 2014-12-13 MED ORDER — POLYETHYLENE GLYCOL 3350 17 G PO PACK: 17.0000 g | PACK | Freq: Every day | ORAL | Status: DC | PRN

## 2014-12-13 MED ORDER — DOCUSATE SODIUM 100 MG PO CAPS
200.0000 mg | ORAL_CAPSULE | Freq: Every day | ORAL | Status: DC
  Administered 2014-12-13: 200 mg via ORAL
  Filled 2014-12-13: qty 2

## 2014-12-13 MED ORDER — RIVAROXABAN 20 MG PO TABS
20.0000 mg | ORAL_TABLET | Freq: Every day | ORAL | Status: DC
  Administered 2014-12-13: 20 mg via ORAL
  Filled 2014-12-13: qty 1

## 2014-12-13 MED ORDER — BISACODYL 10 MG RE SUPP
10.0000 mg | Freq: Every day | RECTAL | Status: DC | PRN
  Filled 2014-12-13: qty 1

## 2014-12-13 NOTE — Progress Notes (Signed)
Pt stated she does not wear CPAP at night and does not wish to wear tonight, RT informed pt to call for RT if she changed her mind

## 2014-12-13 NOTE — Progress Notes (Signed)
Paged [facility] for something for constipation.

## 2014-12-13 NOTE — Progress Notes (Signed)
Patient expressed that she in not comfortable doing home with catheter, charge nurse aware and nurse on dayshift reported that doctor is aware and will address tomorrow. I will pass this along to next day shift nurse in the morning.

## 2014-12-13 NOTE — Progress Notes (Signed)
[facility] PROGRESS NOTE  [name] MIW:[id number] DOB: 11-27-1955 DOA: 12/10/2014 PCP: [name], MD  Assessment/Plan:  Atrial fibrillation with RVR -Continued on amiodarone for rate control -HR stable -Likely development of RVR secondary to hypokalemia, see below -Cont to correct lytes as needed -Stopped ASA as it has been 30 days since PCI - Xarelto 20mg  resumed   Hypotension -Seems multifactorial related to increased ventricular rates as well as volume depletion based on review of patient's lab data -Holding bp meds -BP remains soft   Hypokalemia -Replaced -Patient reports noncompliance with potassium in setting of regular use of Lasix -Follow lytes and cont to correct as needed   Hypothyroidism -TSH was normal during previous admission earlier in the month so no indication to repeat   Chronic diastolic heart failure -Currently compensated but monitor closely with administration of IV fluids in setting of rapid ventricular response -Most recent chest x-ray without edema   Coronary artery disease s/p CABG and prior PCI to LAD -On admit, pt reported chest heaviness and upper arm heaviness -Cardiology following -Cycle troponin. Peak trop 0.25, trending down -Recommendations for medical management. -If angina or PAF, Cardiology recommendation for Ranexa 500mg  BID   Cardiomyopathy with EF 40% at TEE 02/17/14 (likely tachycardia mediated - Myoview 02/19/14 neg for ischemia with normal EF) -Repeat echocardiogram 11/08/2014 shows improvement in ejection fraction up to 65-70%   HTN  -Current blood pressure remains soft so hold any offending medications   Acute renal failure on CKD stage III -Appears to have minor acute renal insufficiency likely related to low perfusion from tachycardia as well as poor oral intake and setting of diuretics -IV fluid as above   Chronic urinary retention -Continue Flomax -Indwelling foley in place -Pt follows Dr.  Vernie Ammons as an outpatient   OSA  -Follow -Provide CPAP if patient desires   Hyperlipidemia -Continue statin therapy -Note on medication reconciliation from pharmacist documents patient reports has difficulty swallowing medication   History of right MCA stroke -Currently neurologically stable   Pulmonary hypertension/COPD/Tobacco use disorder -Currently without wheezing and appears compensated -Cessation counseling regarding tobacco abuse provided -Nicotine patch if needed-will not apply now given RVR  Hematuria - Noted overnight while undergoing i/o cath while on anticoagulation - Much improved with indwelling foley - H/h is remaining stable  Bladder spasm - discussed with pharmacy - Myrbetriq appears to be the best option at this time, as other regimen such as oxybutynin may worsen tachyarrhythmias - Little improvement with Myrbetriq  Code Status: Full Family Communication: Pt in room Disposition Plan: Pending   Consultants:  Cardiology  Procedures:    Antibiotics:  none (indicate start date, and stop date if known)  HPI/Subjective: Eager to have foley removed prior to discharge. No acute events noted  Objective: Filed Vitals:   12/12/14 1700 12/12/14 2000 12/13/14 0000 12/13/14 0450  BP: 108/69 108/73 108/58 99/62  Pulse: 62 61 62   Temp: 98.5 F (36.9 C) 97.7 F (36.5 C) 98.2 F (36.8 C) 97.7 F (36.5 C)  TempSrc: Oral   Oral  Resp: 20 22 19 19   Height:      Weight:    70.943 kg (156 lb 6.4 oz)  SpO2: 98% 100% 98% 99%    Intake/Output Summary (Last 24 hours) at 12/13/14 1510 Last data filed at 12/13/14 1200  Gross per 24 hour  Intake    675 ml  Output   1075 ml  Net   -400 ml   [employer]  12/11/14 0613 12/12/14 0400 12/13/14 0450  Weight: 68.04 kg (150 lb) 68.493 kg (151 lb) 70.943 kg (156 lb 6.4 oz)    Exam:   General:  Awake, in nad  Cardiovascular: regular, s1, s2  Respiratory: normal resp effort, no  wheezing  Abdomen: soft, nondistended  Musculoskeletal: perfused, no clubbing   Data Reviewed: Basic Metabolic Panel:  Recent Labs Lab 12/10/14 1420 12/10/14 1435 12/10/14 1812 12/10/14 1928 12/11/14 0351 12/12/14 0523  NA 139 141  --  139 140 142  K 2.4* 2.4*  --  2.5* 2.8* 3.4*  CL 100 100  --  103 108 113*  CO2 23  --   --  GLUCOSE 133* 141*  --  85 104* 80  BUN 26* 27*  --  24* 20 13  CREATININE 1.83* 1.70*  --  1.54* 1.45* 1.23*  CALCIUM 9.5  --   --  8.2* 8.1* 8.5  MG  --   --  1.5  --  2.7*  --    Liver Function Tests:  Recent Labs Lab 12/10/14 1420  AST 18  ALT 10  ALKPHOS 87  BILITOT 0.7  PROT 8.0  ALBUMIN 4.1   No results for input(s): LIPASE, AMYLASE in the last 168 hours. No results for input(s): AMMONIA in the last 168 hours. CBC:  Recent Labs Lab 12/10/14 1420  12/11/14 0351  12/11/14 2240 12/12/14 0523 12/12/14 1630 12/12/14 1833 12/13/14 0422  WBC 8.2  --  6.2  --   --  4.0  --   --  4.4  NEUTROABS 5.7  --   --   --   --   --   --   --   --   HGB 14.1  < > 10.9*  < > 10.1* 9.5* 9.7* 9.7* 9.3*  HCT 39.9  < > 31.1*  < > 29.2* 27.0* 28.1* 28.0* 27.1*  MCV 90.9  --  90.9  --   --  91.5  --   --  93.4  PLT 255  --  169  --   --  135*  --   --  133*  < > = values in this interval not displayed. Cardiac Enzymes:  Recent Labs Lab 12/10/14 1812 12/10/14 2325 12/11/14 0351  TROPONINI 0.19* 0.25* 0.22*   BNP (last 3 results)  Recent Labs  10/11/14 0954 11/17/14 2107  BNP 213.0* 69.0    ProBNP (last 3 results)  Recent Labs  02/18/14 1205 03/03/14 1539 11/08/14 1247  PROBNP 1629.0* 644.7* 89.0    CBG: No results for input(s): GLUCAP in the last 168 hours.  Recent Results (from the past 240 hour(s))  MRSA PCR Screening     Status: None   Collection Time: 12/10/14  9:15 PM  Result Value Ref Range Status   MRSA by PCR NEGATIVE NEGATIVE Final    Comment:        The GeneXpert MRSA Assay (FDA approved for NASAL  specimens only), is one component of a comprehensive MRSA colonization surveillance program. It is not intended to diagnose MRSA infection nor to guide or monitor treatment for MRSA infections.   Urine culture     Status: None   Collection Time: 12/10/14 11:19 PM  Result Value Ref Range Status   Specimen Description URINE, CATHETERIZED  Final   Special Requests NONE  Final   Colony Count NO GROWTH Performed at [facility]   Final   Culture NO GROWTH Performed at  [facility]   Final   Report Status 12/12/2014 FINAL  Final     Studies: No results found.  Scheduled Meds: . amiodarone  400 mg Oral Daily  . atorvastatin  40 mg Oral q1800  . buPROPion  300 mg Oral Daily  . clopidogrel  75 mg Oral Q breakfast  . feeding supplement (ENSURE ENLIVE)  237 mL Oral TID BM  . ferrous sulfate  325 mg Oral Q breakfast  . folic acid  1 mg Oral Daily  . levothyroxine  125 mcg Oral Daily  . LORazepam  0.5-1 mg Oral BID  . mirabegron ER  25 mg Oral Daily  . multivitamin with minerals  1 tablet Oral Daily  . potassium chloride  40 mEq Oral BID  . rivaroxaban  20 mg Oral Q supper  . sodium chloride  3 mL Intravenous Q12H  . spironolactone  12.5 mg Oral Daily  . tamsulosin  0.4 mg Oral Daily  . thiamine  100 mg Oral Daily  . topiramate  50 mg Oral Daily   And  . topiramate  100 mg Oral QHS   Continuous Infusions:    Principal Problem:   Atrial fibrillation with RVR Active Problems:   Hypothyroidism   OSA (obstructive sleep apnea)   Chronic diastolic heart failure   CAD '07, LAD PCI 2012, SVG-PDA PTCA 11/10/14   Cardiomyopathy-h/o tachycardia mediated-EF 65% per echo March 2016   Hyperlipidemia   History of right MCA stroke   Long-term (current) use of anticoagulants   Pulmonary hypertension   COPD (chronic obstructive pulmonary disease)   HTN (hypertension)   Tobacco use disorder   Hypokalemia   CKD (chronic kidney disease), stage III   Demand ischemia  secondary to AF with RVR   CHIU, STEPHEN K  [facility] Pager [phone]. If 7PM-7AM, please contact night-coverage at [email or web address], password [facility] 12/13/2014, 3:10 PM  LOS: 3 days

## 2014-12-13 NOTE — Progress Notes (Signed)
Subjective:  No chest pain  Objective:  Vital Signs in the last 24 hours: Temp:  [97.7 F (36.5 C)-98.5 F (36.9 C)] 97.7 F (36.5 C) (04/05 0450) Pulse Rate:  [57-62] 62 (04/05 0000) Resp:  [19-22] 19 (04/05 0450) BP: (99-108)/(58-73) 99/62 mmHg (04/05 0450) SpO2:  [98 %-100 %] 99 % (04/05 0450) Weight:  [156 lb 6.4 oz (70.943 kg)] 156 lb 6.4 oz (70.943 kg) (04/05 0450)  Intake/Output from previous day:  Intake/Output Summary (Last 24 hours) at 12/13/14 1053 Last data filed at 12/13/14 0800  Gross per 24 hour  Intake    675 ml  Output    925 ml  Net   -250 ml    Physical Exam: General appearance: alert, cooperative and no distress Lungs: clear to auscultation bilaterally Heart: regular rate and rhythm  Abdomen: obese non tender   Rate: 58  Rhythm: sinus bradycardia  Lab Results:  Recent Labs  12/12/14 0523  12/12/14 1833 12/13/14 0422  WBC 4.0  --   --  4.4  HGB 9.5*  < > 9.7* 9.3*  PLT 135*  --   --  133*  < > = values in this interval not displayed.  Recent Labs  12/11/14 0351 12/12/14 0523  NA 140 142  K 2.8* 3.4*  CL 108 113*  CO2 24 22  GLUCOSE 104* 80  BUN 20 13  CREATININE 1.45* 1.23*    Recent Labs  12/10/14 2325 12/11/14 0351  TROPONINI 0.25* 0.22*   No results for input(s): INR in the last 72 hours.  Imaging: Imaging results have been reviewed  Cardiac Studies:  Assessment/Plan:  59 y.o. female with a PMH significant for CAD s/p CABG in 2007, HTN, dyslipidemia, COPD and PAF (last undergoing TEE/cardioversion in June 2015) on chronic amiodarone and Xarelto therapy.[name] She was recently seen in the office by Dr. Delton See and had EKG changes. Cath 11/10/14 demonstrated an occluded SVG to the RPDA. She had balloon angioplasty to the vein graft by Dr. Herbie Baltimore with reduction of ostial stenosis to 50% and the mid focal lesion to 60%. TIMI-3 flow was restored but overall an inconplete result. She has PAF as noted. She has a history of prior  stroke and high CHADSVASC score of 5 and is on Xarelto.  She was admitted 12/10/14 with AF with RVR. She reports being out of some of her medications, including amiodarone for several days. Her Troponin has bumped to 0.25 pk. Plan is for continued medical Rx. Amiodarone resumed and she is in NSR.    Principal Problem:   Atrial fibrillation with RVR Active Problems:   Hypothyroidism   OSA (obstructive sleep apnea)   Chronic diastolic heart failure   Coronary artery disease s/p CABG and prior PCI to LAD   Cardiomyopathy-h/o tachycardia mediated-EF 65% per echo March 2016   Hyperlipidemia   Hypotension   History of right MCA stroke   Pulmonary hypertension   COPD (chronic obstructive pulmonary disease)   HTN (hypertension)   Tobacco use disorder   Hypokalemia   Coronary artery disease involving autologous vein coronary bypass graft without angina pectoris   CKD (chronic kidney disease), stage III   PLAN: Check BMP today since we are actively following K+.  Saline lock IVF and mobilize, B/P is better.  She is on Plavix, ASA was stopped.  She was on Heparin for cath but this has been stopped (no cath planned). She did have gross hematuria on Heparin but this has cleared. Will  resume Xarelto 20 mg daily and see how she does. I understand she does have a Urologist.     [place] [phone] 12/13/2014, 10:53 AM   I have seen and examined the patient along with Corine Shelter PA-C.  I have reviewed the chart, notes and new data.  I agree with PA/NP's note.  Key new complaints: no angina, no bleeding Key examination changes: no signs of CHF, maintaining NSR Key new findings / data: minimal change in Hgb  PLAN: No angina or PAF. If either occurs I would recommend Ranexa 500 mg BID. Monitor K daily while in the hospital.  Thurmon Fair, MD, [facility] [phone] 12/13/2014, 1:41 PM

## 2014-12-14 ENCOUNTER — Telehealth: Payer: Self-pay

## 2014-12-14 DIAGNOSIS — E039 Hypothyroidism, unspecified: Secondary | ICD-10-CM

## 2014-12-14 LAB — CBC
HEMATOCRIT: 29.3 % — AB (ref 36.0–46.0)
HEMOGLOBIN: 9.9 g/dL — AB (ref 12.0–15.0)
MCH: 31.5 pg (ref 26.0–34.0)
MCHC: 33.8 g/dL (ref 30.0–36.0)
MCV: 93.3 fL (ref 78.0–100.0)
Platelets: 149 10*3/uL — ABNORMAL LOW (ref 150–400)
RBC: 3.14 MIL/uL — AB (ref 3.87–5.11)
RDW: 13.2 % (ref 11.5–15.5)
WBC: 4.9 10*3/uL (ref 4.0–10.5)

## 2014-12-14 LAB — BASIC METABOLIC PANEL
Anion gap: 4 — ABNORMAL LOW (ref 5–15)
BUN: 10 mg/dL (ref 6–23)
CO2: 20 mmol/L (ref 19–32)
Calcium: 8.8 mg/dL (ref 8.4–10.5)
Chloride: 115 mmol/L — ABNORMAL HIGH (ref 96–112)
Creatinine, Ser: 1.23 mg/dL — ABNORMAL HIGH (ref 0.50–1.10)
GFR calc Af Amer: 55 mL/min — ABNORMAL LOW (ref >90)
GFR calc non Af Amer: 48 mL/min — ABNORMAL LOW (ref >90)
Glucose, Bld: 84 mg/dL (ref 70–99)
Potassium: 4.7 mmol/L (ref 3.5–5.1)
Sodium: 139 mmol/L (ref 135–145)

## 2014-12-14 LAB — MAGNESIUM: MAGNESIUM: 1.6 mg/dL (ref 1.5–2.5)

## 2014-12-14 MED ORDER — TAMSULOSIN HCL 0.4 MG PO CAPS: 0.4000 mg | ORAL_CAPSULE | Freq: Every day | ORAL | Status: DC

## 2014-12-14 MED ORDER — MAGNESIUM SULFATE 2 GM/50ML IV SOLN
2.0000 g | Freq: Once | INTRAVENOUS | Status: AC
Start: 2014-12-14 — End: 2014-12-14
  Administered 2014-12-14: 2 g via INTRAVENOUS
  Filled 2014-12-14 (×2): qty 50

## 2014-12-14 MED ORDER — SPIRONOLACTONE 25 MG PO TABS: 12.5000 mg | ORAL_TABLET | Freq: Every day | ORAL | Status: DC

## 2014-12-14 MED ORDER — MIRABEGRON ER 25 MG PO TB24: 25.0000 mg | ORAL_TABLET | Freq: Every day | ORAL | Status: DC

## 2014-12-14 MED ORDER — POTASSIUM CHLORIDE CRYS ER 20 MEQ PO TBCR: EXTENDED_RELEASE_TABLET | ORAL | Status: DC

## 2014-12-14 MED ORDER — AMIODARONE HCL 200 MG PO TABS: 200.0000 mg | ORAL_TABLET | Freq: Every day | ORAL | Status: DC

## 2014-12-14 MED ORDER — MAGNESIUM SULFATE 4 GM/100ML IV SOLN: 4.0000 g | Freq: Once | INTRAVENOUS | Status: DC

## 2014-12-14 MED ORDER — TRAMADOL HCL 50 MG PO TABS: ORAL_TABLET | ORAL | Status: DC

## 2014-12-14 MED ORDER — CYANOCOBALAMIN 1000 MCG PO TABS: 1000.0000 ug | ORAL_TABLET | Freq: Every day | ORAL | Status: DC

## 2014-12-14 MED ORDER — SORBITOL 70 % SOLN: 30.0000 mL | Freq: Every day | Status: DC | PRN

## 2014-12-14 NOTE — Progress Notes (Addendum)
Subjective:  No chest pain, no hematuria  Objective:  Vital Signs in the last 24 hours: Temp:  [97.8 F (36.6 C)-98.5 F (36.9 C)] 97.8 F (36.6 C) (04/06 0400) Pulse Rate:  [55-59] 55 (04/06 0400) Resp:  [13-17] 14 (04/06 0919) BP: (116-129)/(76-84) 116/81 mmHg (04/06 0919) SpO2:  [98 %-99 %] 99 % (04/06 0919) Weight:  [156 lb 15.6 oz (71.204 kg)] 156 lb 15.6 oz (71.204 kg) (04/06 0400)  Intake/Output from previous day:  Intake/Output Summary (Last 24 hours) at 12/14/14 1013 Last data filed at 12/14/14 0417  Gross per 24 hour  Intake      0 ml  Output   1200 ml  Net  -1200 ml    Physical Exam: General appearance: alert, cooperative and no distress Lungs: clear to auscultation bilaterally Heart: regular rate and rhythm  Abdomen: obese non tender   Rate: 58  Rhythm: sinus bradycardia  Lab Results:  Recent Labs  12/13/14 0422 12/14/14 0328  WBC 4.4 4.9  HGB 9.3* 9.9*  PLT 133* 149*    Recent Labs  12/13/14 1405 12/14/14 0328  NA 141 139  K 4.3 4.7  CL 117* 115*  CO2 18* 20  GLUCOSE 115* 84  BUN 11 10  CREATININE 1.19* 1.23*   No results for input(s): TROPONINI in the last 72 hours.  Invalid input(s): CK, MB No results for input(s): INR in the last 72 hours.  Imaging: Imaging results have been reviewed  Cardiac Studies:  Assessment/Plan:          59 y.o. female with a PMH significant for CAD s/p CABG in 2007, HTN, dyslipidemia, COPD and PAF (last undergoing TEE/cardioversion in June 2015) on chronic amiodarone and Xarelto therapy.[name] She was recently seen in the office by Dr. Delton See and had EKG changes. Cath 11/10/14 demonstrated an occluded SVG to the RPDA. She had balloon angioplasty to the vein graft by Dr. Herbie Baltimore 11/10/14 with reduction of ostial stenosis to 50% and the mid focal lesion to 60%. TIMI-3 flow was restored but overall an inconplete result. She had PAF and was sent home on Amiodarone. She has a history of prior stroke and high  CHADSVASC score of 5 and is on Xarelto.         She was admitted 12/10/14 with AF with RVR. She reports being out of some of her medications, including amiodarone for several days. Her Troponin has bumped to 0.25 pk. Plan is for continued medical Rx. Amiodarone resumed and she is in NSR. She had hematuria on Heparin (Xarelto had been held for possible cath) but this has resolved. She had hypokalemia but K+ today up to 4.7 with addition of Aldactone- will stop K+ supplement.    Principal Problem:   Atrial fibrillation with RVR Active Problems:   CAD '07, LAD PCI 2012, SVG-PDA PTCA 11/10/14   Demand ischemia secondary to AF with RVR   Chronic diastolic heart failure   Cardiomyopathy-h/o tachycardia mediated-EF 65% per echo March 2016   COPD (chronic obstructive pulmonary disease)   HTN (hypertension)   Hypokalemia   CKD (chronic kidney disease), stage III   Hypothyroidism   OSA- C-pap intol   Hyperlipidemia   History of right MCA stroke   Long-term (current) use of anticoagulants   Pulmonary hypertension   Tobacco use disorder   PLAN: She appears to be stable for discharge from cardiology standpoint, consider decreasing Amiodarone to 200 mg daily-will review with MD. Maryclare Labrador need to DC foley and make sure  she can void before discharge, she has had problems with urinary retention. F/U with Dr Nelson/ office APP in 1-2 weeks.   Corine Shelter PA-C Beeper [phone] 12/14/2014, 10:13 AM  I have seen and examined the patient along with Corine Shelter PA-C.  I have reviewed the chart, notes and new data.  I agree with PA's note.   PLAN: Agree. Amiodarone was started 2 months ago. Reduce amiodarone to 200 mg daily. Repeat BMET in 1-2 weeks for K/renal function evaluation on spironolactone.  Thurmon Fair, MD, [facility] [phone] 12/14/2014, 1:40 PM

## 2014-12-14 NOTE — Progress Notes (Signed)
Pt's iv's removed and placed off telemetry. Reviewed discharge instructions and sent pt home with information on followup appointments, medication list and prescription, general discharge instruction and educational material. Pt will be picked by son at 5pm. Gathered pt belongings and placed in bag for pt.

## 2014-12-14 NOTE — Telephone Encounter (Signed)
Steward Drone at [facility] called to see if we had samples  Of xarelto 20 mg for this patient I told her we had about two weeks, then she asked me about patient assistance program. i told her that the patient could apply for it to see if they can get it. So I placed the samples up front along with a patient application forms

## 2014-12-14 NOTE — Discharge Summary (Addendum)
Physician Discharge Summary  [name] RDE:[id number] DOB: 1956-02-26 DOA: 12/10/2014  PCP: [name], MD  Admit date: 12/10/2014 Discharge date: 12/14/2014  Time spent: 70 minutes  Recommendations for Outpatient Follow-up:  1. Follow-up with Dr. Delton See of cardiology in 2 weeks.on follow-up patient in need a basic metabolic profile done to follow-up on electrolytes and renal function. Patient's A. Fib will need to be reassessed. Patient with also some financial issues in obtaining her xarelto. 2. Follow-up with [name], MD in 1 week. On follow-up patient needs a basic metabolic profile done to follow-up with electro lites and renal function. Patient need a magnesium level checked. Patient's blood pressure need to be reassessed. Patient also need a CBC done to follow-up on H&H. 3. Follow up with Dr. Vernie Ammons, of urology in 2 weeks for follow-up on urinary retention.   Discharge Diagnoses:  Principal Problem:   Atrial fibrillation with RVR Active Problems:   Hypokalemia   Hypothyroidism   OSA- C-pap intol   Chronic diastolic heart failure   CAD '07, LAD PCI 2012, SVG-PDA PTCA 11/10/14   Cardiomyopathy-h/o tachycardia mediated-EF 65% per echo March 2016   Hyperlipidemia   History of right MCA stroke   Long-term (current) use of anticoagulants   Pulmonary hypertension   COPD (chronic obstructive pulmonary disease)   HTN (hypertension)   Tobacco use disorder   CKD (chronic kidney disease), stage III   Demand ischemia secondary to AF with RVR   Discharge Condition: Stable and improved  Diet recommendation: Heart healthy  Filed Weights   12/12/14 0400 12/13/14 0450 12/14/14 0400  Weight: 68.493 kg (151 lb) 70.943 kg (156 lb 6.4 oz) 71.204 kg (156 lb 15.6 oz)    History of present illness:  Per [name], NP/Dr.Elmahi 59 year old female with multiple medical problems including known coronary artery disease/history of CABG status post cardiac catheterization  3/3 with PCI to LAD, chronic atrial fibrillation with recurrent atrial fibrillation and RVR, status post TEE cardioversion June 2015, known COPD with pulmonary hypertension with ongoing tobacco abuse, pulmonary hypertension, previous right MCA stroke, pulmonary nodules, dyslipidemia, hypothyroidism and hypertension. She also has nonischemic cardiomyopathy presumed to be tachycardia mediated with last EF of 40% in June 2015. Patient presented to the ER with complaints of progressive shortness of breath for the past 2 days. She stated symptoms initially started as malaise and dyspnea on exertion but on day of admission,  she noticed resting shortness of breath. In addition she noticed chest heaviness in the center part of her chest without any diaphoresis or nausea and was also noticing that her upper arms were heavy especially when she attempted to comb her hair. Also reported anorexia /poor oral intake for at least several days. She had taken most of her medications as prescribed. She reported she ran out of amiodarone 4 days ago because she did not have a refill but does have a follow-up appointment scheduled with cardiology, she ran out of Flomax recently because she could not afford the medication but her doctor gave her samples of Rapaflo. In addition she reported she forgets to take her potassium supplementation on many occasions. She also has low magnesium and stated she has difficulty swallowing the magnesium pills.  Upon presentation to the ER patient was found to be in atrial fibrillation with RVR with ventricular rates up into the 170s. Her potassium was less than 3 and she was given IV potassium. ER physician considered cardioversion and possibility of discharge home but then it was discovered  the patient had a brief interruption in her Xarelto for the catheterization procedure on 3/3 and therefore had not been a full month on this medication so the cardiologist recommended to not pursue cardioversion  and instead recommended admission for rate control and further evaluation. Patient's blood pressure had been somewhat soft with systolic readings in the 90s. Patient reports she did take her Lasix today so fluid challenges have been ordered by the emergency room physician. Currently at rest her heart rate has been ranging between 120 and 140 bpm. She still was facing some chest heaviness as described above but not as severe. She was afebrile and maintaining O2 saturations 100% on room air. Laboratory data revealed potassium 2.4 BUN 27, creatinine 1.70. Baseline BUN 20 with creatinine 1.69. Glucose 141, troponin 0.05, hemoglobin 14.6 with a baseline hemoglobin anywhere between 9.8 and 11.1.  Hospital Course:  #1 atrial fibrillation RVR Patient was admitted with A. fib with RVR and noted to be severely hypokalemic which was felt to be the etiology of her A. fib with RVR secondary to some medication noncompliance. Patient was loaded with IV amiodarone and electrolytes were repleted. Cardiology was also consulted and saw the patient in consultation. Patient was admitted placed on telemetry. Patient's heart rate improved. Patient's aspirin was discontinued as it been 30 days since the PCI. Patient's xarelto was resumed. Patient had run out of some of her medications and was having problems getting any medications and as such had been noncompliant. Patient's heart rate improved patient was subsequently transitioned to oral amiodarone. Patient will be discharged on 200 mg of amiodarone daily. Spironolactone was also added to her regimen. Patient will follow-up with cardiology as outpatient. Patient be discharged in stable and improved condition.  #2 hypotension Patient was noted to be hypotensive on admission. Felt to be multifactorial secondary to A. fib with RVR volume depletion. Patient's blood pressure medications were held. Patient's heart rate was controlled. Patient was placed on IV fluids. Patient's blood  pressure improved and patient's hypotension had resolved by day of discharge.  #3 hypokalemia On admission patient was noted to be hypokalemic with a potassium of 2.4. Magnesium levels which will check came back at 1.5. It was felt patient's hypokalemia secondary to noncompliance with her potassium supplementation. Patient's potassium was repleted. Patient was also started on low-dose spironolactone per cardiology. Patient was given some magnesium. Patient's hypokalemia had resolved by day of discharge. Outpatient follow-up.  #4 hypothyroidism Remained stable throughout a Hoss laceration. Patient was maintained on home dose Synthroid.  #5 chronic diastolic heart failure Remained compensated throughout the hospitalization. Outpatient follow-up.  #6 coronary artery disease status post CABG and prior PCI to the LAD Admission patient had complained of chest heaviness and upper extremity heaviness. Cardiac enzymes were cycled with a peak troponin of 0.25 which trended down. Patient was seen in consultation by cardiology. Medical management was recommended. Patient's right was also controlled. Outpatient follow-up.  #7 cardiomyopathy with EF of 40% at TEE 02/17/2014 [likely tachycardia mediated-Myoview 02/19/49 negative for ischemia with normal EF]. Patient had a repeat 2-D echo done 11/08/2014 that showed improvement in ejection fraction up to 65-70%. Patient was followed by cardiology throughout the hospitalization. Remained stable.  #8 hypertension On admission patient was noted to be hypotensive. Patient blood pressure improved by day of discharge.  #9 chronic urinary retention/bladder spasms Patient was noted to have a history of chronic urinary retention. Patient was placed on Flomax. Patient had run out of Flomax also prior to admission. Foley  catheter was placed. Patient was followed. Foley catheter was discontinued on day of discharge. Patient was noted to have voided twice prior to discharge.  Patient was also started on myrbetriq for bladder spasms. Patient will follow-up with her urologist as outpatient.  #10 acute on chronic kidney disease stage III Patient was noted to have minor acute renal insufficiency felt to be secondary to low perfusion from her A. fib with RVR as well as poor oral intake in the setting of diuretics and hypotension. Patient was gently hydrated with IV fluids. Resolved by day of discharge.   #11Obstructive sleep apnea Outpatient follow-up.  #12 hematuria Patient was noted to have some hematuria while undergoing iron while on anticoagulation. This was felt to secondary to trauma. Foley catheter was placed. Hematuria resolved. Hemoglobin remained stable. Outpatient follow-up.  The rest of patient's chronic medical issues remained stable throughout the hospitalization and patient be discharged in stable and improved condition.    Procedures:  Chest x-ray 12/10/2014    Consultations:  Cardiology: Dr. Rennis Golden 12/10/2014  Discharge Exam: Filed Vitals:   12/14/14 1230  BP: 110/64  Pulse: 61  Temp: 98.2 F (36.8 C)  Resp: 14    General: NAD Cardiovascular: Irregularly irregular Respiratory: CTAB  Discharge Instructions   Discharge Instructions    Diet - low sodium heart healthy    Complete by:  As directed      Discharge instructions    Complete by:  As directed   Follow up with Dr Delton See, cardiology in 2 weeks. Follow up with [name], MD in 1 week Follow up with Dr Vernie Ammons in 2 weeks.     Increase activity slowly    Complete by:  As directed           Current Discharge Medication List    START taking these medications   Details  mirabegron ER (MYRBETRIQ) 25 MG TB24 tablet Take 1 tablet (25 mg total) by mouth daily. Qty: 30 tablet, Refills: 0    spironolactone (ALDACTONE) 25 MG tablet Take 0.5 tablets (12.5 mg total) by mouth daily. Qty: 31 tablet, Refills: 0      CONTINUE these medications which have CHANGED    Details  amiodarone (PACERONE) 200 MG tablet Take 1 tablet (200 mg total) by mouth daily. Qty: 31 tablet, Refills: 0    cyanocobalamin 1000 MCG tablet Take 1 tablet (1,000 mcg total) by mouth daily. Qty: 30 tablet, Refills: 0    potassium chloride SA (K-DUR,KLOR-CON) 20 MEQ tablet Take 2 tablets (40 mEq) by mouth 2 times a day to total 80 mEq daily. Qty: 180 tablet, Refills: 0    tamsulosin (FLOMAX) 0.4 MG CAPS capsule Take 1 capsule (0.4 mg total) by mouth daily. Qty: 30 capsule, Refills: 0    traMADol (ULTRAM) 50 MG tablet TAKE 2 TABLETS BY MOUTH EVERY 6 HOURS AS NEEDED FOR MODERATE PAIN Qty: 20 tablet, Refills: 0      CONTINUE these medications which have NOT CHANGED   Details  albuterol (PROVENTIL HFA;VENTOLIN HFA) 108 (90 BASE) MCG/ACT inhaler Inhale 2 puffs into the lungs every 6 (six) hours as needed for wheezing or shortness of breath. Qty: 1 Inhaler, Refills: 2    atorvastatin (LIPITOR) 80 MG tablet Take 0.5 tablets (40 mg total) by mouth daily. Qty: 30 tablet, Refills: 11   Associated Diagnoses: CAD (coronary artery disease) of artery bypass graft    buPROPion (WELLBUTRIN XL) 300 MG 24 hr tablet Take 1 tablet (300 mg total) by mouth daily.  Qty: 30 tablet, Refills: 2    clopidogrel (PLAVIX) 75 MG tablet Take 1 tablet (75 mg total) by mouth daily with breakfast. Qty: 30 tablet, Refills: 6    feeding supplement, ENSURE COMPLETE, (ENSURE COMPLETE) LIQD Take 237 mLs by mouth 3 (three) times daily between meals. Qty: 30 Bottle, Refills: 0    ferrous sulfate 325 (65 FE) MG tablet Take 1 tablet (325 mg total) by mouth 2 (two) times daily with a meal. Qty: 60 tablet, Refills: 3    furosemide (LASIX) 40 MG tablet Take 1 tablet (40 mg total) by mouth daily. Qty: 90 tablet, Refills: 0   Associated Diagnoses: Chronic diastolic heart failure; AKI (acute kidney injury)    levothyroxine (SYNTHROID, LEVOTHROID) 125 MCG tablet Take 1 tablet (125 mcg total) by mouth daily. Qty: 30  tablet, Refills: 11   Associated Diagnoses: Hypothyroidism    LORazepam (ATIVAN) 1 MG tablet Half qam  1  qhs Qty: 45 tablet, Refills: 2    ondansetron (ZOFRAN ODT) 4 MG disintegrating tablet Take 1 tablet (4 mg total) by mouth every 8 (eight) hours as needed for nausea. Qty: 10 tablet, Refills: 1    rivaroxaban (XARELTO) 20 MG TABS tablet Take 1 tablet (20 mg total) by mouth daily with supper. Qty: 30 tablet, Refills: 10    topiramate (TOPAMAX) 50 MG tablet  in am and  at night Qty: 100 tablet, Refills: 3      STOP taking these medications     aspirin EC 81 MG tablet        Allergies  Allergen Reactions  . Avelox [Moxifloxacin Hcl In Nacl] Other (See Comments)    Mental breakdown.  [name] Amoxicillin Hives  . Hydrocodone Itching  . Oxycodone Itching  . Penicillins Hives  . Sulfa Antibiotics Other (See Comments)    unknown   Follow-up Information    Follow up with Lars Masson, MD.   Specialty:  Cardiology   Why:  office will call for an apt with a PA or NP in 1-2 weeks   Contact information:   [address] [place] [address] [phone]       Follow up with [name], MD. Go on 12/22/2014.   Specialties:  Internal Medicine, Pediatrics   Why:  follow up @ 10:45am   Contact information:   [address] Christena Flake [facility] [place] [address] [phone]       Follow up with Garnett Farm, MD. Call in 2 weeks.   Specialty:  Urology   Why:  speak with business office first, then can schedule follow up for urinary retention   Contact information:   [address] [place] [address] [phone]        The results of significant diagnostics from this hospitalization (including imaging, microbiology, ancillary and laboratory) are listed below for reference.    Significant Diagnostic Studies: Dg Chest 2 View  11/17/2014   CLINICAL DATA:  Nausea, vomiting, and constipation for 5 days. Cardiac bypass 9 years ago. Previous  smoker, quit 4 months ago.  EXAM: CHEST  2 VIEW  COMPARISON:  10/11/2014  FINDINGS: Mild hyperinflation and diffuse interstitial changes consistent with emphysema and chronic bronchitis. Postoperative changes in the mediastinum. Normal heart size and pulmonary vascularity. No focal airspace disease or consolidation in the lungs. No blunting of costophrenic angles. No pneumothorax.  IMPRESSION: Mild emphysema and chronic bronchitic changes. No evidence of active pulmonary disease.   Electronically Signed   By: Burman Nieves  M.D.   On: 11/17/2014 22:03   Dg Chest Portable 1 View  12/10/2014   CLINICAL DATA:  Shortness of breath, weakness, chest heaviness  EXAM: PORTABLE CHEST - 1 VIEW  COMPARISON:  11/17/2014  FINDINGS: Evidence of CABG reidentified. Patient is rotated to the right. Heart size upper limits of normal. Mild prominence of the central pulmonary arteries with tapering may be seen with pulmonary arterial hypertension and is stable. No new focal pulmonary opacity. No pleural effusion. No acute osseous finding.  IMPRESSION: Borderline cardiomegaly without acute focal finding.   Electronically Signed   By: Christiana Pellant M.D.   On: 12/10/2014 14:53    Microbiology: Recent Results (from the past 240 hour(s))  MRSA PCR Screening     Status: None   Collection Time: 12/10/14  9:15 PM  Result Value Ref Range Status   MRSA by PCR NEGATIVE NEGATIVE Final    Comment:        The GeneXpert MRSA Assay (FDA approved for NASAL specimens only), is one component of a comprehensive MRSA colonization surveillance program. It is not intended to diagnose MRSA infection nor to guide or monitor treatment for MRSA infections.   Urine culture     Status: None   Collection Time: 12/10/14 11:19 PM  Result Value Ref Range Status   Specimen Description URINE, CATHETERIZED  Final   Special Requests NONE  Final   Colony Count NO GROWTH Performed at [facility]   Final   Culture NO  GROWTH Performed at [facility]   Final   Report Status 12/12/2014 FINAL  Final     Labs: Basic Metabolic Panel:  Recent Labs Lab 12/10/14 1812 12/10/14 1928 12/11/14 0351 12/12/14 0523 12/13/14 1405 12/14/14 0328  NA  --  139 140 142 141 139  K  --  2.5* 2.8* 3.4* 4.3 4.7  CL  --  103 108 113* 117* 115*  CO2  --  18* 20  GLUCOSE  --  85 104* 80 115* 84  BUN  --  24* CREATININE  --  1.54* 1.45* 1.23* 1.19* 1.23*  CALCIUM  --  8.2* 8.1* 8.5 8.7 8.8  MG 1.5  --  2.7*  --  1.5 1.6   Liver Function Tests:  Recent Labs Lab 12/10/14 1420  AST 18  ALT 10  ALKPHOS 87  BILITOT 0.7  PROT 8.0  ALBUMIN 4.1   No results for input(s): LIPASE, AMYLASE in the last 168 hours. No results for input(s): AMMONIA in the last 168 hours. CBC:  Recent Labs Lab 12/10/14 1420  12/11/14 0351  12/12/14 0523 12/12/14 1630 12/12/14 1833 12/13/14 0422 12/14/14 0328  WBC 8.2  --  6.2  --  4.0  --   --  4.4 4.9  NEUTROABS 5.7  --   --   --   --   --   --   --   --   HGB 14.1  < > 10.9*  < > 9.5* 9.7* 9.7* 9.3* 9.9*  HCT 39.9  < > 31.1*  < > 27.0* 28.1* 28.0* 27.1* 29.3*  MCV 90.9  --  90.9  --  91.5  --   --  93.4 93.3  PLT 255  --  169  --  135*  --   --  133* 149*  < > = values in this interval not displayed. Cardiac Enzymes:  Recent Labs Lab 12/10/14 1812 12/10/14 2325 12/11/14 4098  TROPONINI 0.19* 0.25* 0.22*   BNP: BNP (last 3 results)  Recent Labs  10/11/14 0954 11/17/14 2107  BNP 213.0* 69.0    ProBNP (last 3 results)  Recent Labs  02/18/14 1205 03/03/14 1539 11/08/14 1247  PROBNP 1629.0* 644.7* 89.0    CBG: No results for input(s): GLUCAP in the last 168 hours.     SignedRamiro Harvest MD [facility] 12/14/2014, 3:31 PM

## 2014-12-15 ENCOUNTER — Other Ambulatory Visit: Payer: Self-pay | Admitting: Internal Medicine

## 2014-12-16 ENCOUNTER — Ambulatory Visit (HOSPITAL_COMMUNITY): Payer: Self-pay | Admitting: Psychiatry

## 2014-12-16 NOTE — Telephone Encounter (Signed)
Sent to the pharmacy by e-scribe. 

## 2014-12-16 NOTE — Telephone Encounter (Signed)
Ok refill x 1

## 2014-12-19 ENCOUNTER — Other Ambulatory Visit: Payer: Self-pay | Admitting: Neurology

## 2014-12-19 NOTE — Telephone Encounter (Signed)
Cancelled last appt, no showed appt prior to that.

## 2014-12-22 ENCOUNTER — Telehealth: Payer: Self-pay

## 2014-12-22 ENCOUNTER — Encounter: Payer: Self-pay | Admitting: Internal Medicine

## 2014-12-22 ENCOUNTER — Telehealth (HOSPITAL_COMMUNITY): Payer: Self-pay | Admitting: *Deleted

## 2014-12-22 ENCOUNTER — Other Ambulatory Visit: Payer: Self-pay | Admitting: Neurology

## 2014-12-22 ENCOUNTER — Ambulatory Visit (INDEPENDENT_AMBULATORY_CARE_PROVIDER_SITE_OTHER): Payer: Medicare Other | Admitting: Internal Medicine

## 2014-12-22 VITALS — BP 124/92 | Temp 97.6°F | Ht 64.0 in | Wt 143.4 lb

## 2014-12-22 DIAGNOSIS — Z789 Other specified health status: Secondary | ICD-10-CM

## 2014-12-22 DIAGNOSIS — D649 Anemia, unspecified: Secondary | ICD-10-CM | POA: Diagnosis not present

## 2014-12-22 DIAGNOSIS — E039 Hypothyroidism, unspecified: Secondary | ICD-10-CM

## 2014-12-22 DIAGNOSIS — N183 Chronic kidney disease, stage 3 unspecified: Secondary | ICD-10-CM

## 2014-12-22 DIAGNOSIS — D519 Vitamin B12 deficiency anemia, unspecified: Secondary | ICD-10-CM

## 2014-12-22 DIAGNOSIS — I5032 Chronic diastolic (congestive) heart failure: Secondary | ICD-10-CM

## 2014-12-22 DIAGNOSIS — I4891 Unspecified atrial fibrillation: Secondary | ICD-10-CM | POA: Diagnosis not present

## 2014-12-22 DIAGNOSIS — L819 Disorder of pigmentation, unspecified: Secondary | ICD-10-CM

## 2014-12-22 DIAGNOSIS — E876 Hypokalemia: Secondary | ICD-10-CM

## 2014-12-22 DIAGNOSIS — J449 Chronic obstructive pulmonary disease, unspecified: Secondary | ICD-10-CM

## 2014-12-22 DIAGNOSIS — E441 Mild protein-calorie malnutrition: Secondary | ICD-10-CM

## 2014-12-22 DIAGNOSIS — Z87898 Personal history of other specified conditions: Secondary | ICD-10-CM

## 2014-12-22 DIAGNOSIS — Z09 Encounter for follow-up examination after completed treatment for conditions other than malignant neoplasm: Secondary | ICD-10-CM

## 2014-12-22 DIAGNOSIS — E538 Deficiency of other specified B group vitamins: Secondary | ICD-10-CM

## 2014-12-22 LAB — BASIC METABOLIC PANEL
BUN: 21 mg/dL (ref 6–23)
CO2: 25 meq/L (ref 19–32)
Calcium: 10.3 mg/dL (ref 8.4–10.5)
Chloride: 100 mEq/L (ref 96–112)
Creatinine, Ser: 2.03 mg/dL — ABNORMAL HIGH (ref 0.40–1.20)
GFR: 26.81 mL/min — ABNORMAL LOW (ref >60.00)
GLUCOSE: 92 mg/dL (ref 70–99)
POTASSIUM: 3.4 meq/L — AB (ref 3.5–5.1)
SODIUM: 136 meq/L (ref 135–145)

## 2014-12-22 LAB — CBC WITH DIFFERENTIAL/PLATELET
BASOS ABS: 0 10*3/uL (ref 0.0–0.1)
BASOS PCT: 0.4 % (ref 0.0–3.0)
EOS ABS: 0.1 10*3/uL (ref 0.0–0.7)
Eosinophils Relative: 1.8 % (ref 0.0–5.0)
HCT: 41.9 % (ref 36.0–46.0)
Hemoglobin: 14.1 g/dL (ref 12.0–15.0)
LYMPHS PCT: 20.3 % (ref 12.0–46.0)
Lymphs Abs: 1.4 10*3/uL (ref 0.7–4.0)
MCHC: 33.7 g/dL (ref 30.0–36.0)
MCV: 94.8 fl (ref 78.0–100.0)
MONO ABS: 0.6 10*3/uL (ref 0.1–1.0)
Monocytes Relative: 8.6 % (ref 3.0–12.0)
NEUTROS ABS: 4.6 10*3/uL (ref 1.4–7.7)
NEUTROS PCT: 68.9 % (ref 43.0–77.0)
Platelets: 286 10*3/uL (ref 150.0–400.0)
RBC: 4.42 Mil/uL (ref 3.87–5.11)
RDW: 14.2 % (ref 11.5–15.5)
WBC: 6.7 10*3/uL (ref 4.0–10.5)

## 2014-12-22 LAB — IBC PANEL
Iron: 80 ug/dL (ref 42–145)
SATURATION RATIOS: 27.2 % (ref 20.0–50.0)
Transferrin: 210 mg/dL — ABNORMAL LOW (ref 212.0–360.0)

## 2014-12-22 LAB — MAGNESIUM: Magnesium: 1.5 mg/dL (ref 1.5–2.5)

## 2014-12-22 MED ORDER — CYANOCOBALAMIN 1000 MCG/ML IJ SOLN
1000.0000 ug | Freq: Once | INTRAMUSCULAR | Status: AC
  Administered 2014-12-22: 1000 ug via INTRAMUSCULAR

## 2014-12-22 MED ORDER — LEVOTHYROXINE SODIUM 125 MCG PO TABS: 125.0000 ug | ORAL_TABLET | Freq: Every day | ORAL | Status: DC

## 2014-12-22 NOTE — Progress Notes (Signed)
Pre visit review using our clinic review tool, if applicable. No additional management support is needed unless otherwise documented below in the visit note.  Chief Complaint  Patient presents with  . Hospitalization Follow-up    HPI: Patient comes in today as follow up from hospitalization for  Cardiac problems  Complex medical situationaf ib rvr and cardia   Directed to pcp for post  [facility]  . 1 week and acardiology 2 weeks   bmp and anemia follow up advised  No current cp sob now  Feels much better than on admission  Ran out of potassium for a while  Just got it  Supposed to take 10 2 po bid  Had appt with cards next week  Had vomiting the   Day home from hosp x 1    Taking left over   Zofran.  Diarrhea and bowel control  And loose .  No more diarrhea.  Better doesn't know why  intaek  Muffins  baloney sandwich.   Lots of water and caffiene fee coke with sugar.  No tob no etoh  breathing is fine now.  New meds amiodarone and spironolactone  Sent home on lasix . And  Xerlato.   living =by self renting room. Son takes her shopping  Down on her self for not eating healthier  . sandwiches  frappachino  3 x per week muffins  Not a lot of fruit and veges take ensure to make up for thjis  Quit driving not comfortable .  No fridge on same for and afraid sometimes of going up and down the 2 flights of stairs to the fridge area.  Had low b12 180 in hosp told to take b12 oral not gotten this yet.   ROS: See pertinent positives and negatives per HPI. Hematuria in hospfrom catheterization and anticoagulation    And no other problem.     Not taking myberecno other sx and back to baseline ; saw Ottelin a month before hosp no hx of hematuria gross Still has scalp lump never go derm appt. Now has new ph number  Past Medical History  Diagnosis Date  . CAD (coronary artery disease)     a.  cath 09/2010: LAD stent patent, S-Int/dCFX ok, S-PDA ok, L-LAD atretic;  b. Lexiscan Myoview (02/2014):  no  ischemia, EF 55%; c. 11/2014 Cath/PCI: LM nl, LAD 20pISR, LCX 80-29m, OM1 nl, RI 70p, RCA 40-23m, RPDA 95ost/95-69m (PTCA only w/ reduction to 50p/61m), 60d, L->LAD atretic, VG->RI->OM nl, VG->PDA 100p.  [name] Hypertension   . Hx of CABG   . Hx of transesophageal echocardiography (TEE) for monitoring 11/2010    TEE 11/2010: EF 60-65%, BAE, trivial atrial septal shunt;  right heart cath in 10/2010 with elevated R and L heart pressures and diuretic started  . HLD (hyperlipidemia)   . Hypothyroidism   . COPD (chronic obstructive pulmonary disease)   . Pulmonary nodules     repeat CT due in 11/2011  . Acute right MCA stroke 11/07/10  . 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 Eczema   . Depression   . Atrial fibrillation     a. s/p TEE-DCCV 02/2104; b. Xarelto started  . Cardiomyopathy with EF 40% at TEE 02/17/14 (likely tachycardia mediated - Myoview 02/19/14 neg for ischemia with normal EF) 02/18/2014  . 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 Sleep apnea  recent sleep study  in 04/2014 per chart review  shows no significant OSA  . Dysrhythmia 10/2014    atrial fib with rvr  . GERD (gastroesophageal reflux disease)   . Headache     Family History  Problem Relation Age of Onset  . COPD Mother   . Heart disease Mother   . Arthritis Mother     Rheumatoid and PMR  . Osteoporosis Mother     Mom fractured hip  . Diabetes type II Mother   . Depression Mother   . Heart attack Father   . Depression Father   . Hypertension Father   . Alcohol abuse Father   . Depression Sister   . Anxiety disorder Sister   . Drug abuse Sister     History   Social History  . Marital Status: Divorced    Spouse Name: N/A  . Number of Children: 2  . Years of Education: N/A   Occupational History  . DISABLE     Social History Main Topics  . Smoking status: Former Smoker -- 0.25 packs/day for 30 years    Types: Cigarettes    Quit date: 09/10/2014  .  Smokeless tobacco: Never Used  . Alcohol Use: No  . Drug Use: No  . Sexual Activity: Not Currently   Other Topics Concern  . None   Social History Narrative   Divorced   After stroke was living with sister and mother after sister asked her to leave. Mom has since deceased    Difficulties over control.    Then moved back in for about 6 weeks.   . tranportaion difficult son  helping   Ex-smoker   Was living at friends  Sister and her had argument and she was told to leave .    She was living in a homeless shelter for the last 3 weeks [facility]    son had help with transportation and medication       Work status regular  before got sick on short-term disability now lost t insurance after the stroke and couldn't work was  denied ssi    2 times.  She is not eligible for Medicaid because she doesn't have dependent children.         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   Lives in  rented house 2 house mates males keep tp self has her own b room  Doing well     Outpatient Encounter Prescriptions as of 12/22/2014  Medication Sig  . albuterol (PROVENTIL HFA;VENTOLIN HFA) 108 (90 BASE) MCG/ACT inhaler Inhale 2 puffs into the lungs every 6 (six) hours as needed for wheezing or shortness of breath.  [name] amiodarone (PACERONE) 200 MG tablet Take 1 tablet (200 mg total) by mouth daily.  [name] atorvastatin (LIPITOR) 80 MG tablet Take 0.5 tablets (40 mg total) by mouth daily. (Patient taking differently: Take 40 mg by mouth daily at 6 PM. )  . buPROPion (WELLBUTRIN XL) 300 MG 24 hr tablet Take 1 tablet (300 mg total) by mouth daily.  . clopidogrel (PLAVIX) 75 MG tablet Take 1 tablet (75 mg total) by mouth daily with breakfast.  . feeding supplement, ENSURE COMPLETE, (ENSURE COMPLETE) LIQD  Take 237 mLs by mouth 3 (three)  times daily between meals.  . ferrous sulfate 325 (65 FE) MG tablet Take 1 tablet (325 mg total) by mouth 2 (two) times daily with a meal. (Patient taking differently: Take 325 mg by mouth daily with breakfast. )  . furosemide (LASIX) 40 MG tablet Take 1 tablet (40 mg total) by mouth daily.  [name] levothyroxine (SYNTHROID, LEVOTHROID) 125 MCG tablet Take 1 tablet (125 mcg total) by mouth daily.  . ondansetron (ZOFRAN-ODT) 4 MG disintegrating tablet DISSOLVE 1 TABLET ON THE TONGUE EVERY 8 HOURS AS NEEDED FOR NAUSEA  . potassium chloride SA (K-DUR,KLOR-CON) 20 MEQ tablet Take 2 tablets (40 mEq) by mouth 2 times a day to total 80 mEq daily.  . rivaroxaban (XARELTO) 20 MG TABS tablet Take 1 tablet (20 mg total) by mouth daily with supper.  [name] spironolactone (ALDACTONE) 25 MG tablet Take 0.5 tablets (12.5 mg total) by mouth daily.  . tamsulosin (FLOMAX) 0.4 MG CAPS capsule Take 1 capsule (0.4 mg total) by mouth daily.  [name] topiramate (TOPAMAX) 50 MG tablet  in am and  at night (Patient taking differently: Take 50-100 mg by mouth 2 (two) times daily.  in am and  at night)  . traMADol (ULTRAM) 50 MG tablet TAKE 2 TABLETS BY MOUTH EVERY 6 HOURS AS NEEDED FOR MODERATE PAIN  . [DISCONTINUED] levothyroxine (SYNTHROID, LEVOTHROID) 125 MCG tablet Take 1 tablet (125 mcg total) by mouth daily.  . cyanocobalamin 1000 MCG tablet Take 1 tablet (1,000 mcg total) by mouth daily. (Patient not taking: Reported on 12/22/2014)  . LORazepam (ATIVAN) 1 MG tablet Half qam  1  qhs (Patient not taking: Reported on 12/22/2014)  . [DISCONTINUED] mirabegron ER (MYRBETRIQ) 25 MG TB24 tablet Take 1 tablet (25 mg total) by mouth daily.  . [EXPIRED] cyanocobalamin ((VITAMIN B-12)) injection 1,000 mcg     EXAM:  BP 124/92 mmHg  Temp(Src) 97.6 F (36.4 C) (Oral)  Ht  (1.626 m)  Wt 143 lb 6.4 oz (65.046 kg)  BMI 24.60 kg/m2  Body mass index is 24.6 kg/(m^2).  GENERAL: vitals  reviewed and listed above, alert, oriented, appears well hydrated and in no acute distress looks chronically ill alert  oriented  HEENT: atraumatic, conjunctiva  clear, no obvious abnormalities on inspection of external nose and ears OP : no lesion edema or exudate  NECK: no obvious masses on inspection palpation  LUNGS: clear to auscultation bilaterally, no wheezes, rales or rhonchi, good air movement CV: HRRR, no clubbing cyanosis or  peripheral edema nl cap refill  Ambulatory no tremor s Skin nodule on scalp also  Back noted darker  irreg mole about 3-4 mm.  MS: moves all extremities without noticeable focal  abnormality PSYCH: pleasant and cooperative,  coherenet oriented  Emotional when speaking of son and eating is a " night mare" for her   Wt Readings from Last 3 Encounters:  12/22/14 143 lb 6.4 oz (65.046 kg)  12/14/14 156 lb 15.6 oz (71.204 kg)  11/11/14 157 lb 10.1 oz (71.5 kg)    ASSESSMENT AND PLAN:  Discussed the following assessment and plan:  Hypokalemia - Plan: Basic metabolic panel, CBC with Differential/Platelet, Magnesium, IBC panel  CKD (chronic kidney disease), stage III - Plan: Basic metabolic panel, CBC with Differential/Platelet, Magnesium, IBC panel  Anemia, unspecified anemia type - Plan: Basic metabolic panel, CBC with Differential/Platelet, Magnesium, IBC panel, CANCELED: Vitamin B12  Low blood potassium - Plan: Basic metabolic panel, CBC with Differential/Platelet, Magnesium, IBC panel  Hospital discharge follow-up - Plan:  Basic metabolic panel, CBC with Differential/Platelet, Magnesium, IBC panel  Atrial fibrillation with RVR - Plan: Basic metabolic panel, CBC with Differential/Platelet, Magnesium, IBC panel  Chronic diastolic heart failure - Plan: Basic metabolic panel, CBC with Differential/Platelet, Magnesium, IBC panel  Chronic obstructive pulmonary disease, unspecified COPD, unspecified chronic bronchitis type - Plan: Basic metabolic panel, CBC with  Differential/Platelet, Magnesium, IBC panel  Vitamin B12 deficiency - 180  in hosp .  no oral s left inj today .  - Plan: CANCELED: Vitamin B12  Pigmented skin lesion of uncertain nature - ref  noted by nurse in hosp also  - Plan: Ambulatory referral to Dermatology  Anemia, B12 deficiency - Plan: cyanocobalamin ((VITAMIN B-12)) injection 1,000 mcg  Hypothyroidism, unspecified hypothyroidism type - refill meds   Mild malnutrition - see text  Medically complex patient  malnourished for many reasons easy acess to fridge etc.  Own issues  b12 def give inj today and take orals  Labs today and then plan fu  To see scott weaver next week .  Refill thryoid meds  -Patient advised to return or notify health care team  if symptoms worsen ,persist or new concerns arise.  Patient Instructions  Keep cards appt. Think about  Getting  Dorm type fridge as discussed.  Will notify you  of labs when available.  ROV in 2-3 months or as needed . Or as needed   Still plan fu with dr Vernie Ammons as we discussed . Ok to refill thryoid med today  b12 today   Get the b12 ask [employer] K. Panosh M.D.  Total visit 60 mins > 50% spent counseling and coordinating care  Related to all of above     Hospital Course:  For review #1 atrial fibrillation RVR Patient was admitted with A. fib with RVR and noted to be severely hypokalemic which was felt to be the etiology of her A. fib with RVR secondary to some medication noncompliance. Patient was loaded with IV amiodarone and electrolytes were repleted. Cardiology was also consulted and saw the patient in consultation. Patient was admitted placed on telemetry. Patient's heart rate improved. Patient's aspirin was discontinued as it been 30 days since the PCI. Patient's xarelto was resumed. Patient had run out of some of her medications and was having problems getting any medications and as such had been noncompliant. Patient's heart rate improved  patient was subsequently transitioned to oral amiodarone. Patient will be discharged on 200 mg of amiodarone daily. Spironolactone was also added to her regimen. Patient will follow-up with cardiology as outpatient. Patient be discharged in stable and improved condition.  #2 hypotension Patient was noted to be hypotensive on admission. Felt to be multifactorial secondary to A. fib with RVR volume depletion. Patient's blood pressure medications were held. Patient's heart rate was controlled. Patient was placed on IV fluids. Patient's blood pressure improved and patient's hypotension had resolved by day of discharge.  #3 hypokalemia On admission patient was noted to be hypokalemic with a potassium of 2.4. Magnesium levels which will check came back at 1.5. It was felt patient's hypokalemia secondary to noncompliance with her potassium supplementation. Patient's potassium was repleted. Patient was also started on low-dose spironolactone per cardiology. Patient was given some magnesium. Patient's hypokalemia had resolved by day of discharge. Outpatient follow-up.  #4 hypothyroidism Remained stable throughout a Hoss laceration. Patient was maintained on home dose Synthroid.  #5 chronic diastolic heart failure Remained compensated throughout the hospitalization. Outpatient follow-up.  #  6 coronary artery disease status post CABG and prior PCI to the LAD Admission patient had complained of chest heaviness and upper extremity heaviness. Cardiac enzymes were cycled with a peak troponin of 0.25 which trended down. Patient was seen in consultation by cardiology. Medical management was recommended. Patient's right was also controlled. Outpatient follow-up.  #7 cardiomyopathy with EF of 40% at TEE 02/17/2014 [likely tachycardia mediated-Myoview 02/19/49 negative for ischemia with normal EF]. Patient had a repeat 2-D echo done 11/08/2014 that showed improvement in ejection fraction up to 65-70%. Patient was  followed by cardiology throughout the hospitalization. Remained stable.  #8 hypertension On admission patient was noted to be hypotensive. Patient blood pressure improved by day of discharge.  #9 chronic urinary retention/bladder spasms Patient was noted to have a history of chronic urinary retention. Patient was placed on Flomax. Patient had run out of Flomax also prior to admission. Foley catheter was placed. Patient was followed. Foley catheter was discontinued on day of discharge. Patient was noted to have voided twice prior to discharge. Patient was also started on myrbetriq for bladder spasms. Patient will follow-up with her urologist as outpatient.  #10 acute on chronic kidney disease stage III Patient was noted to have minor acute renal insufficiency felt to be secondary to low perfusion from her A. fib with RVR as well as poor oral intake in the setting of diuretics and hypotension. Patient was gently hydrated with IV fluids. Resolved by day of discharge.  #11Obstructive sleep apnea Outpatient follow-up.  #12 hematuria Patient was noted to have some hematuria while undergoing iron while on anticoagulation. This was felt to secondary to trauma. Foley catheter was placed. Hematuria resolved. Hemoglobin remained stable. Outpatient follow-up.  The rest of patient's chronic medical issues remained stable throughout the hospitalization and patient be discharged in stable and improved condition.    Procedures:  Chest x-ray 12/10/2014    Consultations:  Cardiology: Dr. Rennis Golden 12/10/2014  Discharge Exam: Filed Vitals:   12/14/14 1230  BP: 110/64  Pulse: 61  Temp: 98.2 F (36.8 C)  Resp: 14    General: NAD Cardiovascular: Irregularly irregular Respiratory: CTAB  Discharge Instructions   Discharge Instructions    Diet - low sodium heart healthy  Complete by: As directed      Discharge instructions  Complete by: As directed   Follow up with  Dr Delton See, cardiology in 2 weeks. Follow up with [name], MD in 1 week Follow up with Dr Vernie Ammons in 2 weeks.     Increase activity slowly  Complete by: As directed

## 2014-12-22 NOTE — Telephone Encounter (Signed)
Pt not ready for CR.  Pt sees scott weaver in the office on 4/20. Will check back after appt completed to assess readiness for pt to participate in CR. Alanson Aly, BSN

## 2014-12-22 NOTE — Patient Instructions (Addendum)
Keep cards appt. Think about  Getting  Dorm type fridge as discussed.  Will notify you  of labs when available.  ROV in 2-3 months or as needed . Or as needed   Still plan fu with dr Vernie Ammons as we discussed . Ok to refill thryoid med today  b12 today   Get the b12 ask oharmacist

## 2014-12-22 NOTE — Telephone Encounter (Signed)
Patient sent someone to the office to pick up more samples of xarelto 20 mg, I called the patient about the samples and about the patient assistants forms . The patient states that she was going to bring the forms back next week when she sees Mildred. I gave the person  A month of samples for the patient

## 2014-12-22 NOTE — Telephone Encounter (Signed)
No showed appt on 02/01, no showed appt on 02/02 cancelled appt on 02/16

## 2014-12-23 NOTE — Telephone Encounter (Signed)
Patient has appt scheduled

## 2014-12-27 NOTE — Progress Notes (Signed)
Cardiology Office Note   Date:  12/28/2014   ID:  [name], DOB Aug 13, 1956, MRN [id number]  PCP:  [name], MD  Cardiologist:  Dr. Tobias Alexander     Chief Complaint  Patient presents with  . Coronary Artery Disease  . Atrial Fibrillation     History of Present Illness: [name] is a 59 y.o. female with a hx of CAD s/p CABG and subsequent stenting of the native LAD (LIMA-LAD found to be atretic), PAF (on amiodarone and Xarelto), chronic combined systolic and diastolic HF (EF 78% in 2015 >> improved to 65-70% in 3/16), HTN, HL, CKD, OSA, CVA in 2012, COPD, bradycardia.    Evaluated by Dr. Tobias Alexander 11/2014 for progressively worsening DOE.  R/L heart cath was arranged.  She was admitted 3/3-3/4. Cardiac catheterization demonstrated patent LAD stent, SVG-RI/OM patent, SVG-PDA occluded (new finding), severe distal PDA disease treated with balloon angioplasty only (This was suboptimal as balloon was not fully expanded).  Because she is on chronic xarelto therapy, she was discharged on aspirin, Plavix, and xarelto. She will need to discontinue aspirin after one month and continue Plavix and xarelto after that time.  Readmitted 4/2-4/6 with atrial fibrillation with RVR. She had run out of amiodarone 4 days prior to admission. She was hypokalemic with a potassium less than 3. She was placed on IV amiodarone. Potassium was replaced. Patient returned to NSR and she was transitioned back to oral amiodarone. She was placed on spironolactone to help with achieving normal potassium levels. Hospitalization was complicated by hypotension as well as worsening renal insufficiency felt to be related to hypoperfusion from AF with RVR.  Troponins were elevated. As the patient had incomplete dilation of the PDA during previous angioplasty, this was not felt to be a surprise in the setting of atrial fibrillation with RVR. This was felt to be demand ischemia. Medical therapy was  continued. Aspirin was stopped and she was >30 days post PCI.    She returns for follow-up.  Since DC, she denies chest pain.  She has noted continued DOE.  She is NYHA 2b-3.  She denies any changes.  She denies orthopnea, PND, edema.  She denies syncope. However, she notes orthostatic intolerance and associated near syncope.  She feels weak and "washed out."  She saw her PCP recently.  Creatinine was higher than previous on lab work (1.23 >> 2.03).     Studies/Reports Reviewed Today:  LHC/PCI 11/10/14 LM: ok LAD: stent in the proximal segment that is roughly 20% in-stent restenosis. RI: 70% proximal disease.  LCx:  80-90% stenosis in the mid AV groove segment RCA: prox 40%, mid 40-50%, RPDA ostial 95% then 95-99% then 60% L-LAD:  Atretic (old) S-RI/OM:  Patent  S-RPDA:  prox 100% PCI:  Suboptimal PTCA only of the ostial RPDA and mid RPDA leading to the anastomosis site. Unable to fully expand the balloon.  Echo 11/08/14 - Mild focal basal hypertrophy of the septum. LVF vigorous. EF 65% to 70%.  HK basalinferior myocardium. Grade 1 diastolic dysfunction. - Aortic valve: There was trivial regurgitation. - Left atrium: The atrium was mildly dilated. Impressions: Compared to the prior study, there has been no significant interval change.  Myoview 02/19/14 IMPRESSION: No evidence for reversibility or myocardial ischemia.  Calculated ejection fraction is 55%.  Carotid US 5/15 No sig ICA stenosis   Past Medical History  Diagnosis Date  . CAD (coronary artery disease)     a.  cath 09/2010: LAD  stent patent, S-Int/dCFX ok, S-PDA ok, L-LAD atretic;  b. Lexiscan Myoview (02/2014):  no ischemia, EF 55%; c. 11/2014 Cath/PCI: LM nl, LAD 20pISR, LCX 80-31m, OM1 nl, RI 70p, RCA 40-43m, RPDA 95ost/95-93m (PTCA only w/ reduction to 50p/73m), 60d, L->LAD atretic, VG->RI->OM nl, VG->PDA 100p.  [name] Hypertension   . Hx of CABG   . Hx of transesophageal echocardiography (TEE) for monitoring 11/2010    TEE  11/2010: EF 60-65%, BAE, trivial atrial septal shunt;  right heart cath in 10/2010 with elevated R and L heart pressures and diuretic started  . HLD (hyperlipidemia)   . Hypothyroidism   . COPD (chronic obstructive pulmonary disease)   . Pulmonary nodules     repeat CT due in 11/2011  . Acute right MCA stroke 11/07/10  . 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 Eczema   . Depression   . Atrial fibrillation     a. s/p TEE-DCCV 02/2104; b. Xarelto started  . Cardiomyopathy with EF 40% at TEE 02/17/14 (likely tachycardia mediated - Myoview 02/19/14 neg for ischemia with normal EF) 02/18/2014  . 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 Sleep apnea     recent sleep study  in 04/2014 per chart review  shows no significant OSA  . Dysrhythmia 10/2014    atrial fib with rvr  . GERD (gastroesophageal reflux disease)   . Headache     Past Surgical History  Procedure Laterality Date  . Coronary artery bypass graft    . Debriment for infection in chest    . Chest wall reconstruction    . Bilateral knee surgery    . Bladder surgery    . Left mastoidectomy    . Hernia repair    . Cath 2012    . Tee without cardioversion N/A 02/17/2014    Procedure: TRANSESOPHAGEAL ECHOCARDIOGRAM (TEE);  Surgeon: Pricilla Riffle, MD;  Location: [facility] ENDOSCOPY;  Service: Cardiovascular;  Laterality: N/A;  . Cardioversion N/A 02/17/2014    Procedure: CARDIOVERSION;  Surgeon: Pricilla Riffle, MD;  Location: [facility] ENDOSCOPY;  Service: Cardiovascular;  Laterality: N/A;  . Left and right heart catheterization with coronary angiogram N/A 11/10/2014    Procedure: LEFT AND RIGHT HEART CATHETERIZATION WITH CORONARY ANGIOGRAM;  Surgeon: Marykay Lex, MD;  Location: [facility] CATH LAB;  Service: Cardiovascular;  Laterality: N/A;     Current Outpatient Prescriptions  Medication Sig Dispense Refill  . albuterol (PROVENTIL HFA;VENTOLIN HFA) 108 (90 BASE) MCG/ACT inhaler Inhale 2 puffs into the lungs  every 6 (six) hours as needed for wheezing or shortness of breath. 1 Inhaler 2  . amiodarone (PACERONE) 200 MG tablet Take 1 tablet (200 mg total) by mouth daily. 31 tablet 0  . atorvastatin (LIPITOR) 80 MG tablet Take 0.5 tablets (40 mg total) by mouth daily. (Patient taking differently: Take 40 mg by mouth daily at 6 PM. ) 30 tablet 11  . buPROPion (WELLBUTRIN XL) 300 MG 24 hr tablet Take 1 tablet (300 mg total) by mouth daily. 30 tablet 2  . clopidogrel (PLAVIX) 75 MG tablet Take 1 tablet (75 mg total) by mouth daily with breakfast. 30 tablet 6  . feeding supplement, ENSURE COMPLETE, (ENSURE COMPLETE) LIQD Take 237 mLs by mouth 3 (three) times daily between meals. 30 Bottle 0  . furosemide (LASIX) 40 MG tablet Take 1 tablet (40 mg total) by mouth daily. 90 tablet 0  .  levothyroxine (SYNTHROID, LEVOTHROID) 125 MCG tablet Take 1 tablet (125 mcg total) by mouth daily. 30 tablet 11  . ondansetron (ZOFRAN-ODT) 4 MG disintegrating tablet DISSOLVE 1 TABLET ON THE TONGUE EVERY 8 HOURS AS NEEDED FOR NAUSEA 10 tablet 0  . potassium chloride SA (K-DUR,KLOR-CON) 20 MEQ tablet Take 2 tablets (40 mEq) by mouth 2 times a day to total 80 mEq daily. 180 tablet 0  . rivaroxaban (XARELTO) 20 MG TABS tablet Take 1 tablet (20 mg total) by mouth daily with supper. 30 tablet 10  . spironolactone (ALDACTONE) 25 MG tablet Take 0.5 tablets (12.5 mg total) by mouth daily. 31 tablet 0  . tamsulosin (FLOMAX) 0.4 MG CAPS capsule Take 1 capsule (0.4 mg total) by mouth daily. 30 capsule 0  . topiramate (TOPAMAX) 50 MG tablet TAKE 1 TABLET BY MOUTH EVERY MORNING AND 2 TABLETS AT NIGHT 90 tablet 1  . traMADol (ULTRAM) 50 MG tablet TAKE 2 TABLETS BY MOUTH EVERY 6 HOURS AS NEEDED FOR MODERATE PAIN 20 tablet 0   No current facility-administered medications for this visit.    Allergies:   Avelox; Amoxicillin; Hydrocodone; Oxycodone; Penicillins; and Sulfa antibiotics    Social History:  The patient  reports that she quit smoking  about 3 months ago. Her smoking use included Cigarettes. She has a 7.5 pack-year smoking history. She has never used smokeless tobacco. She reports that she does not drink alcohol or use illicit drugs.   Family History:  The patient's family history includes Alcohol abuse in her father; Anxiety disorder in her sister; Arthritis in her mother; COPD in her mother; Depression in her father, mother, and sister; Diabetes type II in her mother; Drug abuse in her sister; Heart attack in her father; Heart disease in her mother; Hypertension in her father; Osteoporosis in her mother. There is no history of Stroke.    ROS:   Please see the history of present illness.   Review of Systems  Hematologic/Lymphatic: Bruises/bleeds easily.  Gastrointestinal: Positive for constipation.  Genitourinary: Positive for urgency.  Psychiatric/Behavioral: Positive for depression.  All other systems reviewed and are negative.    PHYSICAL EXAM: VS:  BP 120/93 mmHg  Pulse 78  Ht 5\' 4"  (1.626 m)  Wt 139 lb (63.05 kg)  BMI 23.85 kg/m2    Orthostatic VS for the past 24 hrs:  BP- Lying Pulse- Lying BP- Sitting Pulse- Sitting BP- Standing at 0 minutes Pulse- Standing at 0 minutes  12/28/14 1224 117/89 mmHg 72 114/87 mmHg 80 (!) 89/71 mmHg 96     Wt Readings from Last 3 Encounters:  12/28/14 139 lb (63.05 kg)  12/22/14 143 lb 6.4 oz (65.046 kg)  12/14/14 156 lb 15.6 oz (71.204 kg)     GEN: Well nourished, well developed, in no acute distress HEENT: normal Neck: no JVD,  no masses Cardiac:  Normal S1/S2, RRR; no murmur ,  no rubs or gallops, no edema  R groin without hematoma or bruit  Respiratory:  clear to auscultation bilaterally, no wheezing, rhonchi or rales. GI: soft, nontender, nondistended, + BS MS: no deformity or atrophy Skin: warm and dry  Neuro:  CNs II-XII intact, Strength and sensation are intact Psych: Normal affect   EKG:  EKG is ordered today.  It demonstrates:   NSR, HR 78, normal axis,  inferolateral T-wave inversions, no significant change compared to prior tracing QTc 421   Recent Labs: 11/08/2014: Pro B Natriuretic peptide (BNP) 89.0 11/17/2014: B Natriuretic Peptide 69.0 12/10/2014: ALT 10;  TSH 3.109 12/22/2014: BUN 21; Creatinine 2.03*; Hemoglobin 14.1; Magnesium 1.5; Platelets 286.0; Potassium 3.4*; Sodium 136    Lipid Panel    Component Value Date/Time   CHOL 237* 11/16/2013 0922   TRIG 85 11/16/2013 0922   HDL 68 11/16/2013 0922   CHOLHDL 3.5 11/16/2013 0922   VLDL 17 11/16/2013 0922   LDLCALC 152* 11/16/2013 0922   LDLDIRECT 153.0 01/15/2010 1501      ASSESSMENT AND PLAN:  Coronary artery disease involving autologous vein coronary bypass graft without angina pectoris She denies chest pain.  She has continued DOE.  This is unchanged.  Question if this is her anginal equivalent related to the stenosis in the R PDA.  LHC was arranged due to worsening DOE.  The lesion in the R PDA was not protected (S-RPDA occluded) and the native vessel was suboptimally treated with PTCA.  She is not on beta blocker or nitrate therapy.  I considered adjusting her therapy (adding beta-blocker for anti-anginal control).  However, she has symptomatic orthostatic hypotension. Given her recent worsening creatinine, I suspect she is over diuresed.  Her weight is down (17 lbs).  She does not have evidence of volume excess on exam.  Recent BNPs have been normal.    -  Continue Plavix in addition to Xarelto, statin.  ASA was stopped 30 days post PCI.  -  Adjust medications as below for BP.  -  Consider adding beta-blocker at FU.  -  Consider adding ACE inhibitor back over time.  -  I will contact [facility] and ask them to get her established.  Orthostatic hypotension She has a significant BP drop from lying to standing.  As noted, her volume status is optimal.  Elevated Creatinine likely demonstrates over-diuresis.  -  Hold Lasix, K+, Spironolactone.  -  BMET, CBC today.  -  Repeat BMET 1  week.  PAF (paroxysmal atrial fibrillation) Maintaining NSR. She remains on Amiodarone and Xarelto.  Check BMET, CBC today.  Recent LFTs, TSH ok.  Chronic diastolic heart failure We discussed the importance of monitoring her weights. She has had some issues with her scale. If this is fixed and she can monitor her weights, she will call if her weight increases 3 lbs in one day. Otherwise, she knows to call with increased LE edema or dyspnea.    Essential hypertension Adjust diuretics as noted for orthostatic hypotension.   Hyperlipidemia Continue statin.  CKD (chronic kidney disease), unspecified stage She has had recent worsening of her creatinine as outlined.  Adjust medications as noted.  Check BMET today and repeat in 1 week.    Depression She admits to remaining in her room "all the time."  She does not feel like coming out.  She has some issues with balance probably related to low BP.  However, I have asked her to FU with psych sooner if her depressive symptoms worsen.    Current medicines are reviewed at length with the patient today.  Concerns regarding medicines are as outlined above.  The following changes have been made:    Hold Lasix, K+, Spironolactone   Labs/ tests ordered today include:  Orders Placed This Encounter  Procedures  . Basic Metabolic Panel (BMET)  . CBC w/Diff  . Basic Metabolic Panel (BMET)  . EKG 12-Lead    Disposition:   FU with me 2 weeks.   Signed, Brynda Rim, MHS 12/28/2014 12:33 PM    [facility] [address] [place], [place], [place]  [address] Phone: [phone]; Fax: [phone]

## 2014-12-28 ENCOUNTER — Telehealth: Payer: Self-pay

## 2014-12-28 ENCOUNTER — Telehealth: Payer: Self-pay | Admitting: Internal Medicine

## 2014-12-28 ENCOUNTER — Encounter: Payer: Self-pay | Admitting: Physician Assistant

## 2014-12-28 ENCOUNTER — Ambulatory Visit (INDEPENDENT_AMBULATORY_CARE_PROVIDER_SITE_OTHER): Payer: Medicare Other | Admitting: Physician Assistant

## 2014-12-28 ENCOUNTER — Telehealth: Payer: Self-pay | Admitting: *Deleted

## 2014-12-28 VITALS — BP 120/93 | HR 78 | Ht 64.0 in | Wt 139.0 lb

## 2014-12-28 DIAGNOSIS — N189 Chronic kidney disease, unspecified: Secondary | ICD-10-CM | POA: Diagnosis not present

## 2014-12-28 DIAGNOSIS — I25810 Atherosclerosis of coronary artery bypass graft(s) without angina pectoris: Secondary | ICD-10-CM

## 2014-12-28 DIAGNOSIS — I10 Essential (primary) hypertension: Secondary | ICD-10-CM | POA: Diagnosis not present

## 2014-12-28 DIAGNOSIS — I480 Paroxysmal atrial fibrillation: Secondary | ICD-10-CM

## 2014-12-28 DIAGNOSIS — I5032 Chronic diastolic (congestive) heart failure: Secondary | ICD-10-CM

## 2014-12-28 DIAGNOSIS — I951 Orthostatic hypotension: Secondary | ICD-10-CM

## 2014-12-28 DIAGNOSIS — E785 Hyperlipidemia, unspecified: Secondary | ICD-10-CM

## 2014-12-28 LAB — CBC WITH DIFFERENTIAL/PLATELET
BASOS ABS: 0 10*3/uL (ref 0.0–0.1)
Basophils Relative: 0.6 % (ref 0.0–3.0)
EOS ABS: 0.1 10*3/uL (ref 0.0–0.7)
Eosinophils Relative: 1.4 % (ref 0.0–5.0)
HCT: 41.5 % (ref 36.0–46.0)
Hemoglobin: 14.2 g/dL (ref 12.0–15.0)
LYMPHS PCT: 19.4 % (ref 12.0–46.0)
Lymphs Abs: 1.1 10*3/uL (ref 0.7–4.0)
MCHC: 34.1 g/dL (ref 30.0–36.0)
MCV: 93.9 fl (ref 78.0–100.0)
Monocytes Absolute: 0.4 10*3/uL (ref 0.1–1.0)
Monocytes Relative: 7.3 % (ref 3.0–12.0)
NEUTROS PCT: 71.3 % (ref 43.0–77.0)
Neutro Abs: 4.2 10*3/uL (ref 1.4–7.7)
PLATELETS: 224 10*3/uL (ref 150.0–400.0)
RBC: 4.42 Mil/uL (ref 3.87–5.11)
RDW: 13.8 % (ref 11.5–15.5)
WBC: 5.9 10*3/uL (ref 4.0–10.5)

## 2014-12-28 LAB — BASIC METABOLIC PANEL
BUN: 27 mg/dL — AB (ref 6–23)
CHLORIDE: 101 meq/L (ref 96–112)
CO2: 27 mEq/L (ref 19–32)
Calcium: 10 mg/dL (ref 8.4–10.5)
Creatinine, Ser: 2.4 mg/dL — ABNORMAL HIGH (ref 0.40–1.20)
GFR: 22.1 mL/min — ABNORMAL LOW (ref >60.00)
Glucose, Bld: 102 mg/dL — ABNORMAL HIGH (ref 70–99)
POTASSIUM: 3.8 meq/L (ref 3.5–5.1)
SODIUM: 136 meq/L (ref 135–145)

## 2014-12-28 MED ORDER — RIVAROXABAN 15 MG PO TABS: 15.0000 mg | ORAL_TABLET | Freq: Every day | ORAL | Status: DC

## 2014-12-28 NOTE — Patient Instructions (Addendum)
Medication Instructions:  1. HOLD LASIX 2. HOLD POTASSIUM  3. HOLD SPIRONOLACTONE  Labwork: 1. TODAY; BMET, CBC W/DIFF 2. 1 WEEK REPEAT BMET; WHILE YOU ARE HERE FOR LAB WORK WE WILL CHECK YOUR WEIGHT   Testing/Procedures: NONE  Follow-Up: 01/13/15 @ 3:45 WITH DR. Delton See  Any Other Special Instructions Will Be Listed Below (If Applicable).  MAKE SURE TO WEIGH DAILY AND CALL IF YOUR WEIGHT IS UP 3 LB'S IN 1 DAY OR YOU HAVE MORE SHORTNESS OF BREATH OR SWELLING

## 2014-12-28 NOTE — Telephone Encounter (Signed)
Patient called to notify you she had labs drawn at [facility] and they stopped her lasix, potassium, and spironolactone.

## 2014-12-28 NOTE — Telephone Encounter (Signed)
Pt notified of lab results and to decrease Xarelto to 15 mg daily; I will place samples at the front ddesk. Pt will cb to schedule lab for next week BMET, relies on son for transportation.

## 2014-12-28 NOTE — Telephone Encounter (Signed)
Faxed patient assistance program forms to [employer]

## 2014-12-28 NOTE — Telephone Encounter (Signed)
Noted  

## 2015-01-03 ENCOUNTER — Other Ambulatory Visit (INDEPENDENT_AMBULATORY_CARE_PROVIDER_SITE_OTHER): Payer: Medicare Other | Admitting: *Deleted

## 2015-01-03 DIAGNOSIS — I5032 Chronic diastolic (congestive) heart failure: Secondary | ICD-10-CM | POA: Diagnosis not present

## 2015-01-03 DIAGNOSIS — N189 Chronic kidney disease, unspecified: Secondary | ICD-10-CM | POA: Diagnosis not present

## 2015-01-03 LAB — BASIC METABOLIC PANEL
BUN: 21 mg/dL (ref 6–23)
CO2: 27 mEq/L (ref 19–32)
CREATININE: 1.65 mg/dL — AB (ref 0.40–1.20)
Calcium: 8.9 mg/dL (ref 8.4–10.5)
Chloride: 108 mEq/L (ref 96–112)
GFR: 34.05 mL/min — ABNORMAL LOW (ref >60.00)
Glucose, Bld: 91 mg/dL (ref 70–99)
Potassium: 3 mEq/L — ABNORMAL LOW (ref 3.5–5.1)
Sodium: 141 mEq/L (ref 135–145)

## 2015-01-04 ENCOUNTER — Telehealth: Payer: Self-pay | Admitting: *Deleted

## 2015-01-04 DIAGNOSIS — N183 Chronic kidney disease, stage 3 unspecified: Secondary | ICD-10-CM

## 2015-01-04 MED ORDER — POTASSIUM CHLORIDE CRYS ER 20 MEQ PO TBCR: 20.0000 meq | EXTENDED_RELEASE_TABLET | Freq: Two times a day (BID) | ORAL | Status: DC

## 2015-01-04 NOTE — Telephone Encounter (Signed)
pt notified of lab results and to start K+ 20 meq BID. BMET 5/6 at her appt with Dr. Delton See. Pt verbalized understanding to plan of care.

## 2015-01-13 ENCOUNTER — Ambulatory Visit: Payer: Self-pay | Admitting: Cardiology

## 2015-01-13 ENCOUNTER — Other Ambulatory Visit: Payer: Self-pay

## 2015-01-17 ENCOUNTER — Other Ambulatory Visit: Payer: Self-pay | Admitting: *Deleted

## 2015-01-17 MED ORDER — AMIODARONE HCL 200 MG PO TABS: 200.0000 mg | ORAL_TABLET | Freq: Every day | ORAL | Status: DC

## 2015-01-24 ENCOUNTER — Ambulatory Visit (INDEPENDENT_AMBULATORY_CARE_PROVIDER_SITE_OTHER): Payer: Medicare Other | Admitting: Cardiology

## 2015-01-24 ENCOUNTER — Other Ambulatory Visit (INDEPENDENT_AMBULATORY_CARE_PROVIDER_SITE_OTHER): Payer: Medicare Other

## 2015-01-24 ENCOUNTER — Encounter: Payer: Self-pay | Admitting: Cardiology

## 2015-01-24 VITALS — BP 130/90 | HR 53 | Ht 64.0 in | Wt 141.0 lb

## 2015-01-24 DIAGNOSIS — N183 Chronic kidney disease, stage 3 unspecified: Secondary | ICD-10-CM

## 2015-01-24 DIAGNOSIS — E785 Hyperlipidemia, unspecified: Secondary | ICD-10-CM | POA: Diagnosis not present

## 2015-01-24 DIAGNOSIS — I10 Essential (primary) hypertension: Secondary | ICD-10-CM

## 2015-01-24 DIAGNOSIS — I25810 Atherosclerosis of coronary artery bypass graft(s) without angina pectoris: Secondary | ICD-10-CM

## 2015-01-24 DIAGNOSIS — I4891 Unspecified atrial fibrillation: Secondary | ICD-10-CM

## 2015-01-24 DIAGNOSIS — I5032 Chronic diastolic (congestive) heart failure: Secondary | ICD-10-CM | POA: Insufficient documentation

## 2015-01-24 LAB — BASIC METABOLIC PANEL
BUN: 13 mg/dL (ref 6–23)
CO2: 22 mEq/L (ref 19–32)
Calcium: 9 mg/dL (ref 8.4–10.5)
Chloride: 112 mEq/L (ref 96–112)
Creatinine, Ser: 1.07 mg/dL (ref 0.40–1.20)
GFR: 56.12 mL/min — ABNORMAL LOW (ref >60.00)
Glucose, Bld: 95 mg/dL (ref 70–99)
POTASSIUM: 3.4 meq/L — AB (ref 3.5–5.1)
Sodium: 140 mEq/L (ref 135–145)

## 2015-01-24 MED ORDER — AMIODARONE HCL 200 MG PO TABS: 200.0000 mg | ORAL_TABLET | Freq: Every day | ORAL | Status: DC

## 2015-01-24 MED ORDER — NITROGLYCERIN 0.4 MG SL SUBL: 0.4000 mg | SUBLINGUAL_TABLET | SUBLINGUAL | Status: DC | PRN

## 2015-01-24 NOTE — Progress Notes (Signed)
Patient ID: [name], female   DOB: November 05, 1955, 59 y.o.   MRN: [id number]    Cardiology Office Note   Date:  01/24/2015   ID:  [name], DOB April 07, 1956, MRN [id number]  PCP:  [name], MD  Cardiologist:  Dr. Tobias Alexander     No chief complaint on file.    History of Present Illness: ZORAIDA HAVRILLA is a 59 y.o. female with a hx of CAD s/p CABG and subsequent stenting of the native LAD (LIMA-LAD found to be atretic), PAF (on amiodarone and Xarelto), chronic combined systolic and diastolic HF (EF 78% in 2015 >> improved to 65-70% in 3/16), HTN, HL, CKD, OSA, CVA in 2012, COPD, bradycardia.    Evaluated by Dr. Tobias Alexander 11/2014 for progressively worsening DOE.  R/L heart cath was arranged.  She was admitted 3/3-3/4. Cardiac catheterization demonstrated patent LAD stent, SVG-RI/OM patent, SVG-PDA occluded (new finding), severe distal PDA disease treated with balloon angioplasty only (This was suboptimal as balloon was not fully expanded).  Because she is on chronic xarelto therapy, she was discharged on aspirin, Plavix, and xarelto. She will need to discontinue aspirin after one month and continue Plavix and xarelto after that time.  Readmitted 4/2-4/6 with atrial fibrillation with RVR. She had run out of amiodarone 4 days prior to admission. She was hypokalemic with a potassium less than 3. She was placed on IV amiodarone. Potassium was replaced. Patient returned to NSR and she was transitioned back to oral amiodarone. She was placed on spironolactone to help with achieving normal potassium levels. Hospitalization was complicated by hypotension as well as worsening renal insufficiency felt to be related to hypoperfusion from AF with RVR.  Troponins were elevated. As the patient had incomplete dilation of the PDA during previous angioplasty, this was not felt to be a surprise in the setting of atrial fibrillation with RVR. This was felt to be demand ischemia. Medical  therapy was continued. Aspirin was stopped and she was >30 days post PCI.    01/24/15 - 2 months follow up, she feels significantly better, she has minimal CP< and denies SOB, also her dizziness and orthostatic hypotension has improved.  No orthopnea, PND. No bleeding.  Studies/Reports Reviewed Today:  LHC/PCI 11/10/14 LM: ok LAD: stent in the proximal segment that is roughly 20% in-stent restenosis. RI: 70% proximal disease.  LCx:  80-90% stenosis in the mid AV groove segment RCA: prox 40%, mid 40-50%, RPDA ostial 95% then 95-99% then 60% L-LAD:  Atretic (old) S-RI/OM:  Patent  S-RPDA:  prox 100% PCI:  Suboptimal PTCA only of the ostial RPDA and mid RPDA leading to the anastomosis site. Unable to fully expand the balloon.  Echo 11/08/14 - Mild focal basal hypertrophy of the septum. LVF vigorous. EF 65% to 70%.  HK basalinferior myocardium. Grade 1 diastolic dysfunction. - Aortic valve: There was trivial regurgitation. - Left atrium: The atrium was mildly dilated. Impressions: Compared to the prior study, there has been no significant interval change.  Myoview 02/19/14 IMPRESSION: No evidence for reversibility or myocardial ischemia.  Calculated ejection fraction is 55%.  Carotid US 5/15 No sig ICA stenosis   Past Medical History  Diagnosis Date  . CAD (coronary artery disease)     a.  cath 09/2010: LAD stent patent, S-Int/dCFX ok, S-PDA ok, L-LAD atretic;  b. Lexiscan Myoview (02/2014):  no ischemia, EF 55%; c. 11/2014 Cath/PCI: LM nl, LAD 20pISR, LCX 80-87m, OM1 nl, RI 70p, RCA 40-48m, RPDA 95ost/95-43m (PTCA  only w/ reduction to 50p/87m), 60d, L->LAD atretic, VG->RI->OM nl, VG->PDA 100p.  [name] Hypertension   . Hx of CABG   . Hx of transesophageal echocardiography (TEE) for monitoring 11/2010    TEE 11/2010: EF 60-65%, BAE, trivial atrial septal shunt;  right heart cath in 10/2010 with elevated R and L heart pressures and diuretic started  . HLD (hyperlipidemia)   . Hypothyroidism   .  COPD (chronic obstructive pulmonary disease)   . Pulmonary nodules     repeat CT due in 11/2011  . Acute right MCA stroke 11/07/10  . 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 Eczema   . Depression   . Atrial fibrillation     a. s/p TEE-DCCV 02/2104; b. Xarelto started  . Cardiomyopathy with EF 40% at TEE 02/17/14 (likely tachycardia mediated - Myoview 02/19/14 neg for ischemia with normal EF) 02/18/2014  . 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 Sleep apnea     recent sleep study  in 04/2014 per chart review  shows no significant OSA  . Dysrhythmia 10/2014    atrial fib with rvr  . GERD (gastroesophageal reflux disease)   . Headache     Past Surgical History  Procedure Laterality Date  . Coronary artery bypass graft    . Debriment for infection in chest    . Chest wall reconstruction    . Bilateral knee surgery    . Bladder surgery    . Left mastoidectomy    . Hernia repair    . Cath 2012    . Tee without cardioversion N/A 02/17/2014    Procedure: TRANSESOPHAGEAL ECHOCARDIOGRAM (TEE);  Surgeon: Pricilla Riffle, MD;  Location: [facility] ENDOSCOPY;  Service: Cardiovascular;  Laterality: N/A;  . Cardioversion N/A 02/17/2014    Procedure: CARDIOVERSION;  Surgeon: Pricilla Riffle, MD;  Location: [facility] ENDOSCOPY;  Service: Cardiovascular;  Laterality: N/A;  . Left and right heart catheterization with coronary angiogram N/A 11/10/2014    Procedure: LEFT AND RIGHT HEART CATHETERIZATION WITH CORONARY ANGIOGRAM;  Surgeon: Marykay Lex, MD;  Location: [facility] CATH LAB;  Service: Cardiovascular;  Laterality: N/A;     Current Outpatient Prescriptions  Medication Sig Dispense Refill  . albuterol (PROVENTIL HFA;VENTOLIN HFA) 108 (90 BASE) MCG/ACT inhaler Inhale 2 puffs into the lungs every 6 (six) hours as needed for wheezing or shortness of breath. 1 Inhaler 2  . amiodarone (PACERONE) 200 MG tablet Take 1 tablet (200 mg total) by mouth daily. 30 tablet 0  . atorvastatin  (LIPITOR) 80 MG tablet Take 0.5 tablets (40 mg total) by mouth daily. (Patient taking differently: Take 40 mg by mouth daily at 6 PM. ) 30 tablet 11  . buPROPion (WELLBUTRIN XL) 300 MG 24 hr tablet Take 1 tablet (300 mg total) by mouth daily. 30 tablet 2  . clopidogrel (PLAVIX) 75 MG tablet Take 1 tablet (75 mg total) by mouth daily with breakfast. 30 tablet 6  . feeding supplement, ENSURE COMPLETE, (ENSURE COMPLETE) LIQD Take 237 mLs by mouth 3 (three) times daily between meals. 30 Bottle 0  . levothyroxine (SYNTHROID, LEVOTHROID) 125 MCG tablet Take 1 tablet (125 mcg total) by mouth daily. 30 tablet 11  . ondansetron (ZOFRAN-ODT) 4 MG disintegrating tablet DISSOLVE 1 TABLET ON THE TONGUE EVERY 8 HOURS AS NEEDED FOR NAUSEA 10 tablet 0  . potassium chloride SA (K-DUR,KLOR-CON) 20 MEQ tablet Take 1 tablet (20  mEq total) by mouth 2 (two) times daily.    . rivaroxaban (XARELTO) 15 MG TABS tablet Take 1 tablet (15 mg total) by mouth daily with supper. 52 tablet 0  . tamsulosin (FLOMAX) 0.4 MG CAPS capsule Take 1 capsule (0.4 mg total) by mouth daily. 30 capsule 0  . topiramate (TOPAMAX) 50 MG tablet TAKE 1 TABLET BY MOUTH EVERY MORNING AND 2 TABLETS AT NIGHT 90 tablet 1  . traMADol (ULTRAM) 50 MG tablet TAKE 2 TABLETS BY MOUTH EVERY 6 HOURS AS NEEDED FOR MODERATE PAIN 20 tablet 0   No current facility-administered medications for this visit.    Allergies:   Avelox; Amoxicillin; Hydrocodone; Oxycodone; Penicillins; and Sulfa antibiotics    Social History:  The patient  reports that she quit smoking about 4 months ago. Her smoking use included Cigarettes. She has a 7.5 pack-year smoking history. She has never used smokeless tobacco. She reports that she does not drink alcohol or use illicit drugs.   Family History:  The patient's family history includes Alcohol abuse in her father; Anxiety disorder in her sister; Arthritis in her mother; COPD in her mother; Depression in her father, mother, and sister;  Diabetes type II in her mother; Drug abuse in her sister; Heart attack in her father; Heart disease in her mother; Hypertension in her father; Osteoporosis in her mother. There is no history of Stroke.    ROS:   Please see the history of present illness.   Review of Systems  Hematologic/Lymphatic: Bruises/bleeds easily.  Gastrointestinal: Positive for constipation.  Genitourinary: Positive for urgency.  Psychiatric/Behavioral: Positive for depression.  All other systems reviewed and are negative.    PHYSICAL EXAM: VS:  BP 130/90 mmHg  Pulse 53  Ht  (1.626 m)  Wt 141 lb (63.957 kg)  BMI 24.19 kg/m2    No data found.    Wt Readings from Last 3 Encounters:  01/24/15 141 lb (63.957 kg)  12/28/14 139 lb (63.05 kg)  12/22/14 143 lb 6.4 oz (65.046 kg)     GEN: Well nourished, well developed, in no acute distress HEENT: normal Neck: no JVD,  no masses Cardiac:  Normal S1/S2, RRR; no murmur ,  no rubs or gallops, no edema  R groin without hematoma or bruit  Respiratory:  clear to auscultation bilaterally, no wheezing, rhonchi or rales. GI: soft, nontender, nondistended, + BS MS: no deformity or atrophy Skin: warm and dry  Neuro:  CNs II-XII intact, Strength and sensation are intact Psych: Normal affect   EKG:  EKG is ordered today.  It demonstrates:   NSR, HR 78, normal axis, inferolateral T-wave inversions, no significant change compared to prior tracing QTc 421   Recent Labs: 11/08/2014: Pro B Natriuretic peptide (BNP) 89.0 11/17/2014: B Natriuretic Peptide 69.0 12/10/2014: ALT 10; TSH 3.109 12/22/2014: Magnesium 1.5 12/28/2014: Hemoglobin 14.2; Platelets 224.0 01/03/2015: BUN 21; Creatinine 1.65*; Potassium 3.0*; Sodium 141    Lipid Panel    Component Value Date/Time   CHOL 237* 11/16/2013 0922   TRIG 85 11/16/2013 0922   HDL 68 11/16/2013 0922   CHOLHDL 3.5 11/16/2013 0922   VLDL 17 11/16/2013 0922   LDLCALC 152* 11/16/2013 0922   LDLDIRECT 153.0 01/15/2010 1501        ASSESSMENT AND PLAN:  Coronary artery disease involving autologous vein coronary bypass graft without angina pectoris Improved symptoms. ECG shows unchanged negative T waves in the anterolateral and inferior leads.   -  Continue Plavix in addition to Xarelto,  statin.  ASA was stopped 30 days post PCI.  -  Consider adding ACE inhibitor back over time.  - unable to add betablocker as she is bradycardic  Orthostatic hypotension - resolved after holding diuretics  PAF (paroxysmal atrial fibrillation) Maintaining NSR. She remains on Amiodarone and Xarelto. We will go back to 20 mg of Xarelto. Chronic diastolic heart failure She is euvolemic off diuretics, I would continue holding as she just recovered from acute kidney failure  Essential hypertension Controlled.  Hyperlipidemia Continue statin.  CKD (chronic kidney disease), unspecified stage Resolved with holding diuretics and  ACEI, i would not restart at this time   Depression She admits to remaining in her room "all the time."  She does not feel like coming out.  She has some issues with balance probably related to low BP.  However, I have asked her to FU with psych sooner if her depressive symptoms worsen.    Follow up in 4 months.   Lars Masson 01/24/2015

## 2015-01-24 NOTE — Patient Instructions (Signed)
Medication Instructions:   DR Delton See HAS ADVISED THAT YOU TAKE NITROGLYCERIN 0.4 MG ONLY AS NEEDED FOR CHEST PAIN--PLEASE FOLLOW THE INSTRUCTIONS ON THE BOTTLE VERY CAREFULLY       Follow-Up:  4 MONTHS WITH DR Delton See

## 2015-01-25 ENCOUNTER — Ambulatory Visit (INDEPENDENT_AMBULATORY_CARE_PROVIDER_SITE_OTHER): Payer: Medicare Other | Admitting: Psychiatry

## 2015-01-25 ENCOUNTER — Telehealth: Payer: Self-pay | Admitting: *Deleted

## 2015-01-25 DIAGNOSIS — F331 Major depressive disorder, recurrent, moderate: Secondary | ICD-10-CM

## 2015-01-25 DIAGNOSIS — F332 Major depressive disorder, recurrent severe without psychotic features: Secondary | ICD-10-CM

## 2015-01-25 MED ORDER — RIVAROXABAN 20 MG PO TABS
20.0000 mg | ORAL_TABLET | Freq: Every day | ORAL | Status: DC
Start: 2015-01-25 — End: 2015-08-23

## 2015-01-25 MED ORDER — BUPROPION HCL ER (XL) 150 MG PO TB24: ORAL_TABLET | ORAL | Status: DC

## 2015-01-25 NOTE — Telephone Encounter (Signed)
-----   Message from Lars Masson, MD sent at 01/24/2015 11:32 PM EDT ----- I forgot to change her xarelto back to 20 mg po daily as her kidney function is normal now, please call her Thank you, KN

## 2015-01-25 NOTE — Progress Notes (Signed)
[facility] MD Progress Note  01/25/2015 4:45 PM [name]  MRN:  [id number] Subjective:  Distressed Today the patient comes in crying. She clearly is very upset. She a cardiac catheterization and had one vessel that was occluded needed to be ballooned. She recently saw her cardiologist who told her that she's actually doing very well at this time. She has a return appointment with him in 4 months. The patient has no chest pain or shortness of breath. Shortly after her cardiac catheter patient went back and atrial fibrillation. She is placed back on amiodarone and now is in a normal rhythm. The patient acknowledges persistent daily depression. She feels overwhelmed with her financial stresses. The patient lives with 2 other people sharing a room in a house. The patient's son who she is very close to recently lost his job. She feels very financially overwhelmed and feels like she cannot afford to live. The patient denies being suicidal. She says her son stop her from doing this act. The patient admits to daily persistent depression and persistent anhedonia. The patient's appetite is clearly reduced. She is sleeping fairly well but has a significant reduction in energy. Her psychomotor functioning is reduced. The patient feels worthless. The patient denies any psychotic symptomatology. The patient has no history of bipolar disorder. She is no specific anxiety conditions. Over the last year or so we have successfully been able to get her off of Abilify. The possibility of going back to Abilify should be considered at her next visit but for the time being we should adjust up her antidepressant. When she was seen last time we gave her Ativan 1 mg twice a day for sleep but any dose of Ativan seemed oversedated her. She's no longer taking this agent. Pncipal Problem: major depression, recurrent, severe  Diagnosis:   Patient Active Problem List   Diagnosis Date Noted  . Chronic diastolic CHF (congestive heart  failure), NYHA class 2 [I50.32] 01/24/2015  . Demand ischemia secondary to AF with RVR [I24.8] 12/13/2014  . CKD (chronic kidney disease), stage III [N18.3] 11/11/2014  . Hypokalemia [E87.6] 05/22/2014  . Mood disorder [F39] 03/20/2014  . HTN (hypertension) [I10] 03/20/2014  . Anemia, unspecified [D64.9] 03/20/2014  . Tobacco use disorder [Z72.0] 03/20/2014  . Atrial fibrillation with RVR [I48.91] 03/03/2014  . Hypotension [I95.9] 03/03/2014  . Pulmonary hypertension [I27.0] 03/03/2014  . COPD (chronic obstructive pulmonary disease) [J44.9] 03/03/2014  . History of right MCA stroke [Z86.73]   . Long-term (current) use of anticoagulants [Z79.01]   . 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 Hyperlipidemia [E78.5] 02/18/2014  . CAD '07, LAD PCI 2012, SVG-PDA PTCA 11/10/14 [I25.10] 02/16/2014  . Homelessness [Z59.0] 11/12/2011  . Chronic diastolic heart failure [I50.32]   . Pulmonary nodules [R91.8]   . Hypothyroidism [E03.9]   . OSA- C-pap intol [G47.33]    Total Time spent with patient: 30 minutes   Past Medical History:  Past Medical History  Diagnosis Date  . CAD (coronary artery disease)     a.  cath 09/2010: LAD stent patent, S-Int/dCFX ok, S-PDA ok, L-LAD atretic;  b. Lexiscan Myoview (02/2014):  no ischemia, EF 55%; c. 11/2014 Cath/PCI: LM nl, LAD 20pISR, LCX 80-54m, OM1 nl, RI 70p, RCA 40-99m, RPDA 95ost/95-71m (PTCA only w/ reduction to 50p/50m), 60d, L->LAD atretic, VG->RI->OM nl, VG->PDA 100p.  [name] Hypertension   . Hx of CABG   . Hx of transesophageal echocardiography (TEE) for monitoring 11/2010    TEE  11/2010: EF 60-65%, BAE, trivial atrial septal shunt;  right heart cath in 10/2010 with elevated R and L heart pressures and diuretic started  . HLD (hyperlipidemia)   . Hypothyroidism   . COPD (chronic obstructive pulmonary disease)   . Pulmonary nodules     repeat CT due in 11/2011  . Acute right MCA stroke 11/07/10  . Hx MRSA  infection     Chest wall syndrome post CABG  . Eczema   . Depression   . Atrial fibrillation     a. s/p TEE-DCCV 02/2104; b. Xarelto started  . Cardiomyopathy with EF 40% at TEE 02/17/14 (likely tachycardia mediated - Myoview 02/19/14 neg for ischemia with normal EF) 02/18/2014  . 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 Sleep apnea     recent sleep study  in 04/2014 per chart review  shows no significant OSA  . Dysrhythmia 10/2014    atrial fib with rvr  . GERD (gastroesophageal reflux disease)   . Headache     Past Surgical History  Procedure Laterality Date  . Coronary artery bypass graft    . Debriment for infection in chest    . Chest wall reconstruction    . Bilateral knee surgery    . Bladder surgery    . Left mastoidectomy    . Hernia repair    . Cath 2012    . Tee without cardioversion N/A 02/17/2014    Procedure: TRANSESOPHAGEAL ECHOCARDIOGRAM (TEE);  Surgeon: Pricilla Riffle, MD;  Location: [facility] ENDOSCOPY;  Service: Cardiovascular;  Laterality: N/A;  . Cardioversion N/A 02/17/2014    Procedure: CARDIOVERSION;  Surgeon: Pricilla Riffle, MD;  Location: [facility] ENDOSCOPY;  Service: Cardiovascular;  Laterality: N/A;  . Left and right heart catheterization with coronary angiogram N/A 11/10/2014    Procedure: LEFT AND RIGHT HEART CATHETERIZATION WITH CORONARY ANGIOGRAM;  Surgeon: Marykay Lex, MD;  Location: [facility] CATH LAB;  Service: Cardiovascular;  Laterality: N/A;   Family History:  Family History  Problem Relation Age of Onset  . COPD Mother   . Heart disease Mother   . Arthritis Mother     Rheumatoid and PMR  . Osteoporosis Mother     Mom fractured hip  . Diabetes type II Mother   . Depression Mother   . Heart attack Father   . Depression Father   . Hypertension Father   . Alcohol abuse Father   . Depression Sister   . Anxiety disorder Sister   . Drug abuse Sister   . Stroke Neg Hx    Social History:  History  Alcohol Use No     History   Drug Use No    History   Social History  . Marital Status: Divorced    Spouse Name: N/A  . Number of Children: 2  . Years of Education: N/A   Occupational History  . DISABLE     Social History Main Topics  . Smoking status: Former Smoker -- 0.25 packs/day for 30 years    Types: Cigarettes    Quit date: 09/10/2014  . Smokeless tobacco: Never Used  . Alcohol Use: No  . Drug Use: No  . Sexual Activity: Not Currently   Other Topics Concern  . Not on file   Social History Narrative   Divorced   After stroke was living with sister and mother after sister asked her to leave. Mom has since deceased  Difficulties over control.    Then moved back in for about 6 weeks.   . tranportaion difficult son  helping   Ex-smoker   Was living at friends  Sister and her had argument and she was told to leave .    She was living in a homeless shelter for the last 3 weeks [facility]    son had help with transportation and medication       Work status regular  before got sick on short-term disability now lost t insurance after the stroke and couldn't work was  denied ssi    2 times.  She is not eligible for Medicaid because she doesn't have dependent children.         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   Lives in  rented house 2 house mates males keep tp self has her own b room  Doing well    Additional History:    Sleep: Fair  Appetite:  Poor   Assessment:   Musculoskeletal: Strength & Muscle Tone:  Gait & Station:  Patient leans:    Psychiatric Specialty Exam: Physical Exam  ROS  There were no vitals taken for this visit.There is no weight on file to calculate BMI.  General Appearance: Casual  Eye Contact::  Good  Speech:  Normal Rate  Volume:  Normal   Mood:  Depressed  Affect:  Blunt  Thought Process:  Coherent  Orientation:  Full (Time, Place, and Person)  Thought Content:  WDL  Suicidal Thoughts:  No  Homicidal Thoughts:  No  Memory:  NA  Judgement:  Good  Insight:  Good  Psychomotor Activity:  Decreased  Concentration:  Fair  Recall:  Good  Fund of Knowledge:Fair  Language: Good  Akathisia:  No  Handed:  Right  AIMS (if indicated):     Assets:  Desire for Improvement  ADL's:  Intact  Cognition: WNL  Sleep:        Current Medications: Current Outpatient Prescriptions  Medication Sig Dispense Refill  . albuterol (PROVENTIL HFA;VENTOLIN HFA) 108 (90 BASE) MCG/ACT inhaler Inhale 2 puffs into the lungs every 6 (six) hours as needed for wheezing or shortness of breath. 1 Inhaler 2  . amiodarone (PACERONE) 200 MG tablet Take 1 tablet (200 mg total) by mouth daily. 30 tablet 4  . atorvastatin (LIPITOR) 80 MG tablet Take 0.5 tablets (40 mg total) by mouth daily. (Patient taking differently: Take 40 mg by mouth daily at 6 PM. ) 30 tablet 11  . buPROPion (WELLBUTRIN XL) 150 MG 24 hr tablet 3 qam 90 tablet 5  . clopidogrel (PLAVIX) 75 MG tablet Take 1 tablet (75 mg total) by mouth daily with breakfast. 30 tablet 6  . feeding supplement, ENSURE COMPLETE, (ENSURE COMPLETE) LIQD Take 237 mLs by mouth 3 (three) times daily between meals. 30 Bottle 0  . levothyroxine (SYNTHROID, LEVOTHROID) 125 MCG tablet Take 1 tablet (125 mcg total) by mouth daily. 30 tablet 11  . nitroGLYCERIN (NITROSTAT) 0.4 MG SL tablet Place 1 tablet (0.4 mg total) under the tongue every 5 (five) minutes as needed for chest pain. 90 tablet 3  . ondansetron (ZOFRAN-ODT) 4 MG disintegrating tablet DISSOLVE 1 TABLET ON THE TONGUE EVERY 8 HOURS AS NEEDED FOR  NAUSEA 10 tablet 0  . potassium chloride SA (K-DUR,KLOR-CON) 20 MEQ tablet Take 1 tablet (20 mEq total) by mouth 2 (two) times daily.    . rivaroxaban (XARELTO) 15 MG TABS tablet Take 1 tablet (15 mg total) by  mouth daily with supper. 52 tablet 0  . tamsulosin (FLOMAX) 0.4 MG CAPS capsule Take 1 capsule (0.4 mg total) by mouth daily. 30 capsule 0  . topiramate (TOPAMAX) 50 MG tablet TAKE 1 TABLET BY MOUTH EVERY MORNING AND 2 TABLETS AT NIGHT 90 tablet 1  . traMADol (ULTRAM) 50 MG tablet TAKE 2 TABLETS BY MOUTH EVERY 6 HOURS AS NEEDED FOR MODERATE PAIN 20 tablet 0   No current facility-administered medications for this visit.    Lab Results:  Results for orders placed or performed in visit on 01/24/15 (from the past 48 hour(s))  Basic Metabolic Panel (BMET)     Status: Abnormal   Collection Time: 01/24/15  3:12 PM  Result Value Ref Range   Sodium 140 135 - 145 mEq/L   Potassium 3.4 (L) 3.5 - 5.1 mEq/L   Chloride 112 96 - 112 mEq/L   CO2 22 19 - 32 mEq/L   Glucose, Bld 95 70 - 99 mg/dL   BUN 13 6 - 23 mg/dL   Creatinine, Ser 0.80 0.40 - 1.20 mg/dL   Calcium 9.0 8.4 - 22.3 mg/dL   GFR 36.12 (L) >24.49 mL/min    Physical Findings: AIMS:  , ,  ,  ,    CIWA:    COWS:     Treatment Plan Summary: At this time we'll go ahead and increase her Wellbutrin from 300 mg to 450 mg. The patient was told that if she felt worse and considered suicide that she should contact us immediately. The patient be seen again in 4 weeks. At that time we'll rediscuss the process of psychotherapy but I think this patient essentially is having a significant episode of depression. Characterized by daily depression, anhedonia problems through appetite and concentration and a low energy. She is not acutely suicidal at this time. When her next visit if she's not better I would consider the possibility intensive outpatient care. This patient shared that she has no intentions to end her life and wishes to return to see me in 4 weeks on a higher dose of Wellbutrin. At that time I would consider Abilify.    Medical Decision Making:  New problem, with additional work up planned     Lucas Mallow 01/25/2015, 4:45  PM

## 2015-01-25 NOTE — Telephone Encounter (Signed)
Contacted the pt to inform her that per Dr Delton See she would like for the pt to go back on 20 mg of Xarelto po once daily, for now her kidney function is normal.  Confirmed the pharmacy of choice with the pt.  Pt verbalized understanding and agrees with this plan.

## 2015-01-26 ENCOUNTER — Telehealth: Payer: Self-pay | Admitting: *Deleted

## 2015-01-26 DIAGNOSIS — N183 Chronic kidney disease, stage 3 unspecified: Secondary | ICD-10-CM

## 2015-01-26 MED ORDER — POTASSIUM CHLORIDE CRYS ER 20 MEQ PO TBCR
20.0000 meq | EXTENDED_RELEASE_TABLET | Freq: Three times a day (TID) | ORAL | Status: DC
Start: 2015-01-26 — End: 2016-02-21

## 2015-01-26 NOTE — Telephone Encounter (Signed)
Pt notified of lab results and to increase K+ to 20 meq TID, BMET 5/27. Pt vebralized understanding to plan of care and all instructions.

## 2015-01-27 ENCOUNTER — Telehealth: Payer: Self-pay

## 2015-01-27 NOTE — Telephone Encounter (Signed)
refaxed patient application to [email or web address]

## 2015-01-31 ENCOUNTER — Telehealth: Payer: Self-pay

## 2015-01-31 ENCOUNTER — Telehealth: Payer: Self-pay | Admitting: Cardiology

## 2015-01-31 NOTE — Telephone Encounter (Signed)
I called the patient assistance program for [facility] [name] to check on the status of her application. I spoke with Daphine Deutscher and he placed me on hold for about 3 minutes and he came back to the phone stating that the patient was approvaled for patient assistants and will get it free for 12 months, and he said that the card would be here in two weeks. He gave me the card numbers to give to the patient so she could use it at the pharmacy. I called the patient and gave her the numbers. Member ID # [id number], Group # is [id number], [facility] # [id number].

## 2015-01-31 NOTE — Telephone Encounter (Signed)
New problem   Pt need to speak to nurse concerning the prescription assistance program. Please advise pt.

## 2015-01-31 NOTE — Telephone Encounter (Signed)
Spoke with the pt to inform her that I spoke with Bonita Quin LPN who deals with our prescription pt assistance program and she is aware that pt has questions in regards to this program.  Informed the pt that I will route this message to [facility] to follow-up with the pt.  Pt verbalized understanding and agrees with this plan.

## 2015-01-31 NOTE — Telephone Encounter (Signed)
Rose has initiated this so she is following through.

## 2015-02-03 ENCOUNTER — Other Ambulatory Visit (INDEPENDENT_AMBULATORY_CARE_PROVIDER_SITE_OTHER): Payer: Medicare Other | Admitting: *Deleted

## 2015-02-03 ENCOUNTER — Ambulatory Visit (INDEPENDENT_AMBULATORY_CARE_PROVIDER_SITE_OTHER): Payer: Medicare Other | Admitting: Neurology

## 2015-02-03 ENCOUNTER — Encounter: Payer: Self-pay | Admitting: Neurology

## 2015-02-03 VITALS — BP 143/85 | HR 52 | Wt 140.2 lb

## 2015-02-03 DIAGNOSIS — N183 Chronic kidney disease, stage 3 unspecified: Secondary | ICD-10-CM

## 2015-02-03 DIAGNOSIS — I6529 Occlusion and stenosis of unspecified carotid artery: Secondary | ICD-10-CM | POA: Diagnosis not present

## 2015-02-03 DIAGNOSIS — I25810 Atherosclerosis of coronary artery bypass graft(s) without angina pectoris: Secondary | ICD-10-CM

## 2015-02-03 LAB — BASIC METABOLIC PANEL
BUN: 14 mg/dL (ref 6–23)
CHLORIDE: 112 meq/L (ref 96–112)
CO2: 21 mEq/L (ref 19–32)
Calcium: 8.9 mg/dL (ref 8.4–10.5)
Creatinine, Ser: 1.16 mg/dL (ref 0.40–1.20)
GFR: 51.12 mL/min — AB (ref >60.00)
Glucose, Bld: 70 mg/dL (ref 70–99)
POTASSIUM: 4.3 meq/L (ref 3.5–5.1)
Sodium: 139 mEq/L (ref 135–145)

## 2015-02-03 MED ORDER — TOPIRAMATE 50 MG PO TABS: 50.0000 mg | ORAL_TABLET | Freq: Two times a day (BID) | ORAL | Status: DC

## 2015-02-03 NOTE — Addendum Note (Signed)
Addended by: Tonita Phoenix on: 02/03/2015 11:12 AM   Modules accepted: Orders

## 2015-02-03 NOTE — Patient Instructions (Signed)
I had a long d/w patient about her remote stroke, risk for recurrent stroke/TIAs, personally independently reviewed imaging studies and stroke evaluation results and answered questions.Continue xarelto for atrial fibrillation  for secondary stroke prevention and maintain strict control of hypertension with blood pressure goal below 130/90, diabetes with hemoglobin A1c goal below 6.5% and lipids with LDL cholesterol goal below 100 mg/dL. check screening follow-up carotid ultrasound study. Reduce Topamax to 50 mg twice daily as migraine seemed quite well controlled. Followup in the future with me in one year or call earlier if necessary.

## 2015-02-03 NOTE — Progress Notes (Signed)
[facility] [address] [place]. [name] [phone]       OFFICE FOLLOW UP VISIT NOTE  Ms. Laural BenesKathi G [name] Date of Birth:  March 02, 1956 Medical Record Number:  [id number]   Referring MD:  Tina SidleKurt [name]  Reason for Referral:   headaches HPI: [name] is [age] year Caucasian lady who has a long-standing history of migraine headaches which were previously quite infrequent but who the last 3-4 months or so she noted increasing frequency and severity of headaches. She describes the headache as bitemporal pressure-like moderate to severe 8/10 in severity. Headache is a complaint by mild nausea but no vomiting. She does have light sensitivity and feels better if she sleeps. She takes Ultram which seems to help but going to sleep as necessary. The past she is to get relief with Tylenol No. 3 but she developed allergy to codeine with itching and had stopped it. Headache frequency is every second or third day. She does remember being on Topamax in the past for migraine prophylaxis but stopped 10 years ago. She denies any visual symptoms with the headache, accompanying focal neurological symptoms. She is unable to address this retrieval for headache. 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 She was seen by me at that time she also had a TIA in June 2012 with an MRI showing no acute findings. She was homeless and had no finances and hence she did not keep subsequent followup in our office. [place] in [place] [facility] [place] to [place]. She wasn't Medicaid for a while and recently got Medicaid approval and has moved back [place]. She was seen on 11/16/13 in urgent care by Dr. Maye Hidesurt Lowenstein who ordered some lab work which showed is significantly limited TSH and lipids. The patient had been off her thyroid medication for more than a month at that time. She has subsequently been started on thyroxine but has not  noticed significant improvement. She in fact was seen by a neurologist in [place] for tremor which seems to have resolved. She is on Lipitor 40 mg but states she was switched from Crestor which worked quite well for her but Medicaid would not cover crestor. Now that she has Medicare   she wants to go back on Crestor but wants to wait for this until she sees cardiologist Dr. Delton SeeNelson next week. She states she takes her medication regularly now and her blood pressure is well controlled though it is slightly limited in the office today and 142/97. She has not had any screening carotid Dopplers done for several years. She denies any recurrent TIA or stroke symptoms for the last 3 years since her last visit. She denies any scalp tenderness, jaw claudication, myalgias or loss of vision or blurred vision with her headaches. Update 03/28/2014 : She returns for f/u after initial consult on 01/03/14. She states she's noticed improvement in her headaches which now occur only to 3 times per week and only in the mornings and respond well to determine all. She is tolerating Topamax 50 mg twice daily without any side effects and feels overall better. She however is complaining of excessive daytime sleepiness and she in fact his care to drive as she fell asleep once. She does have a history of sleep apnea and was prescribed a CPAP but she has not been taking it for a year. She states she has a mask which fits but does not  have a CPAP machine. She wants referral to a sleep physician. She was diagnosed with atrial flutter transiently and switched from Plavix to Xarelto. She was admitted for elective cardioversion but spontaneously converted to sinus rhythm. She had MRI scan the brain done on 01/20/2014 which I have personally reviewed shows remote age right middle cerebral artery infarct with encephalomalacia and gliosis. There were moderate changes of chronic microvascular ischemia which appeared more advanced compared with previous  MRI scan dated 03/07/2011. Carotid ultrasound dated 01/14/14 personally reviewed shows no significant except anastomosis and passed Doppler studies show mildly abnormal velocities in the right anterior cerebral artery and left middle cerebral artery of unclear significance. Update 02/03/2015 : She returns for follow-up after last visit in July 2015. She is doing much better with her migraines now and Topamax seems to be working. She is tolerating 50 mg morning and 100 at night without significant side effects. She remains on Xarelto which is causing some minor bruising but no bleeding episodes. She states her blood pressure is very well controlled and lipid profile last checked was fine. She's had no recurrent TIA or stroke symptoms. She continues to have atrial fibrillation off and on and recently has been started on Amiodarone which seems to be working. She had polysomnogram done on 04/21/14 which I have reviewed which shows no evidence of sleep apnea. ROS:   14 system review of systems is positive for headache, activity change only and all other systems negative PMH:  Past Medical History  Diagnosis Date  . CAD (coronary artery disease)     a.  cath 09/2010: LAD stent patent, S-Int/dCFX ok, S-PDA ok, L-LAD atretic;  b. Lexiscan Myoview (02/2014):  no ischemia, EF 55%; c. 11/2014 Cath/PCI: LM nl, LAD 20pISR, LCX 80-57m, OM1 nl, RI 70p, RCA 40-10m, RPDA 95ost/95-16m (PTCA only w/ reduction to 50p/51m), 60d, L->LAD atretic, VG->RI->OM nl, VG->PDA 100p.  [name] Hypertension   . Hx of CABG   . Hx of transesophageal echocardiography (TEE) for monitoring 11/2010    TEE 11/2010: EF 60-65%, BAE, trivial atrial septal shunt;  right heart cath in 10/2010 with elevated R and L heart pressures and diuretic started  . HLD (hyperlipidemia)   . Hypothyroidism   . COPD (chronic obstructive pulmonary disease)   . Pulmonary nodules     repeat CT due in 11/2011  . Acute right MCA stroke 11/07/10  . Hx MRSA infection     Chest wall  syndrome post CABG  . Eczema   . Depression   . Atrial fibrillation     a. s/p TEE-DCCV 02/2104; b. Xarelto started  . Cardiomyopathy with EF 40% at TEE 02/17/14 (likely tachycardia mediated - Myoview 02/19/14 neg for ischemia with normal EF) 02/18/2014  . 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 Sleep apnea     recent sleep study  in 04/2014 per chart review  shows no significant OSA  . Dysrhythmia 10/2014    atrial fib with rvr  . GERD (gastroesophageal reflux disease)   . Headache     Social History:  History   Social History  . Marital Status: Divorced    Spouse Name: N/A  . Number of Children: 2  . Years of Education: N/A   Occupational History  . DISABLE     Social History Main Topics  . Smoking status: Former Smoker -- 0.25 packs/day for 30 years    Types: Cigarettes  Quit date: 09/10/2014  . Smokeless tobacco: Never Used  . Alcohol Use: No  . Drug Use: No  . Sexual Activity: Not Currently   Other Topics Concern  . Not on file   Social History Narrative   Divorced   After stroke was living with sister and mother after sister asked her to leave. Mom has since deceased    Difficulties over control.    Then moved back in for about 6 weeks.   . tranportaion difficult son  helping   Ex-smoker   Was living at friends  Sister and her had argument and she was told to leave .    She was living in a homeless shelter for the last 3 weeks [facility]    son had help with transportation and medication       Work status regular  before got sick on short-term disability now lost t insurance after the stroke and couldn't work was  denied ssi    2 times.  She is not eligible for Medicaid because she doesn't have dependent children.         College graduate ;psychology [employer] graduated May 2011   Has children   Now on medicare /medicaid disability  So she has some acess to health services    Has a drivers licence has stopped driving cause  doesn't feel safe son takes her to get groceries .   Lives in  rented house 2 house mates males keep tp self has her own b room  Doing well     Medications:   Current Outpatient Prescriptions on File Prior to Visit  Medication Sig Dispense Refill  . albuterol (PROVENTIL HFA;VENTOLIN HFA) 108 (90 BASE) MCG/ACT inhaler Inhale 2 puffs into the lungs every 6 (six) hours as needed for wheezing or shortness of breath. 1 Inhaler 2  . amiodarone (PACERONE) 200 MG tablet Take 1 tablet (200 mg total) by mouth daily. 30 tablet 4  . atorvastatin (LIPITOR) 80 MG tablet Take 0.5 tablets (40 mg total) by mouth daily. (Patient taking differently: Take 40 mg by mouth daily at 6 PM. ) 30 tablet 11  . buPROPion (WELLBUTRIN XL) 150 MG 24 hr tablet 3 qam 90 tablet 5  . clopidogrel (PLAVIX) 75 MG tablet Take 1 tablet (75 mg total) by mouth daily with breakfast. 30 tablet 6  . feeding supplement, ENSURE COMPLETE, (ENSURE COMPLETE) LIQD Take 237 mLs by mouth 3 (three) times daily between meals. 30 Bottle 0  . levothyroxine (SYNTHROID, LEVOTHROID) 125 MCG tablet Take 1 tablet (125 mcg total) by mouth daily. 30 tablet 11  . nitroGLYCERIN (NITROSTAT) 0.4 MG SL tablet Place 1 tablet (0.4 mg total) under the tongue every 5 (five) minutes as needed for chest pain. 90 tablet 3  . ondansetron (ZOFRAN-ODT) 4 MG disintegrating tablet DISSOLVE 1 TABLET ON THE TONGUE EVERY 8 HOURS AS NEEDED FOR NAUSEA 10 tablet 0  . potassium chloride SA (K-DUR,KLOR-CON) 20 MEQ tablet Take 1 tablet (20 mEq total) by mouth 3 (three) times daily. 90 tablet 6  . rivaroxaban (XARELTO) 20 MG TABS tablet Take 1 tablet (20 mg total) by mouth daily with supper. 30 tablet 3  . tamsulosin (FLOMAX) 0.4 MG CAPS capsule Take 1 capsule (0.4 mg total) by mouth daily. 30 capsule 0  . traMADol (ULTRAM) 50 MG tablet TAKE 2 TABLETS BY MOUTH EVERY 6 HOURS AS NEEDED FOR MODERATE PAIN 20 tablet 0   No current facility-administered medications on file prior to visit.  Allergies:   Allergies  Allergen Reactions  . Avelox [Moxifloxacin Hcl In Nacl] Other (See Comments)    Mental breakdown.  [name] Amoxicillin Hives  . Hydrocodone Itching  . Oxycodone Itching  . Penicillins Hives  . Sulfa Antibiotics Other (See Comments)    unknown    Physical Exam General: well developed, well nourished middle-age Caucasian lady, seated, in no evident distress Head: head normocephalic and atraumatic.   Neck: supple with no carotid or supraclavicular bruits Cardiovascular: regular rate and rhythm, no murmurs Musculoskeletal: no deformity Skin:  no rash/petichiae Vascular:  Normal pulses all extremities Filed Vitals:   02/03/15 0954  BP: 143/85  Pulse: 52    Neurologic Exam Mental Status: Awake and fully alert. Oriented to place and time. Recent and remote memory intact. Recall 3/3. Animal naming 9. Attention span, concentration and fund of knowledge appropriate. Mood and affect appropriate.  Cranial Nerves: Fundoscopic exam not done  . Pupils equal, briskly reactive to light. Extraocular movements full without nystagmus. Visual fields full to confrontation. Hearing intact. Facial sensation intact. Mild left lower facial asymmetry.Tongue, palate moves normally and symmetrically.  Motor: Normal bulk and tone. Normal strength in all tested extremity muscles. Diminished fine finger movements on the left. Orbits right over left upper extremity. Minimum weakness of the left grip. Sensory.: intact to tough and pinprick and vibratory.  Coordination: Rapid alternating movements normal in all extremities. Finger-to-nose and heel-to-shin performed accurately bilaterally. Gait and Station: Arises from chair without difficulty. Stance is normal. Gait demonstrates normal stride length and balance . Able to heel, toe and tandem walk without difficulty.  Reflexes: 1+ and symmetric. Toes downgoing.       ASSESSMENT: 86 year lady with long-standing history of migraine  headaches with now mixed tension and migraine headaches which have increased in frequency and severity over the last 3-4 months. Remote history of right MCA infarct in February 2012 and TIA in June 2012 with mild residual left sided weakness. Multiple risk factors of hypertension, hyperlipidemia, coronary artery disease and   sleep apnoea.    PLAN: I had a long d/w patient about her remote stroke, risk for recurrent stroke/TIAs, personally independently reviewed imaging studies and stroke evaluation results and answered questions.Continue xarelto for atrial fibrillation  for secondary stroke prevention and maintain strict control of hypertension with blood pressure goal below 130/90, diabetes with hemoglobin A1c goal below 6.5% and lipids with LDL cholesterol goal below 100 mg/dL. check screening follow-up carotid ultrasound study. Reduce Topamax to 50 mg twice daily as migraine seemed quite well controlled. Followup in the future with me in one year or call earlier if necessary.  Delia Heady, MD  Note: This document was prepared with digital dictation and possible smart phrase technology. Any transcriptional errors that result from this process are unintentional.

## 2015-02-10 ENCOUNTER — Other Ambulatory Visit: Payer: Self-pay | Admitting: Cardiology

## 2015-02-11 ENCOUNTER — Other Ambulatory Visit: Payer: Self-pay | Admitting: Cardiology

## 2015-02-13 ENCOUNTER — Other Ambulatory Visit: Payer: Self-pay | Admitting: Cardiology

## 2015-02-15 ENCOUNTER — Telehealth: Payer: Self-pay

## 2015-02-15 ENCOUNTER — Other Ambulatory Visit: Payer: Self-pay | Admitting: Cardiology

## 2015-02-15 NOTE — Telephone Encounter (Signed)
LVM for patient to call office back to schedule US appointment. 

## 2015-02-16 ENCOUNTER — Other Ambulatory Visit: Payer: Self-pay

## 2015-02-16 DIAGNOSIS — I10 Essential (primary) hypertension: Secondary | ICD-10-CM

## 2015-02-16 DIAGNOSIS — I25810 Atherosclerosis of coronary artery bypass graft(s) without angina pectoris: Secondary | ICD-10-CM

## 2015-02-16 DIAGNOSIS — E785 Hyperlipidemia, unspecified: Secondary | ICD-10-CM

## 2015-02-16 DIAGNOSIS — I4891 Unspecified atrial fibrillation: Secondary | ICD-10-CM

## 2015-02-16 MED ORDER — AMIODARONE HCL 200 MG PO TABS: 200.0000 mg | ORAL_TABLET | Freq: Every day | ORAL | Status: DC

## 2015-02-16 NOTE — Telephone Encounter (Signed)
Per note 5.17.16 

## 2015-02-22 ENCOUNTER — Encounter (HOSPITAL_COMMUNITY): Payer: Self-pay | Admitting: Psychiatry

## 2015-02-22 ENCOUNTER — Ambulatory Visit (INDEPENDENT_AMBULATORY_CARE_PROVIDER_SITE_OTHER): Payer: Medicare Other | Admitting: Psychiatry

## 2015-02-22 ENCOUNTER — Telehealth: Payer: Self-pay

## 2015-02-22 VITALS — BP 104/68 | HR 60 | Ht 64.0 in | Wt 137.4 lb

## 2015-02-22 DIAGNOSIS — F330 Major depressive disorder, recurrent, mild: Secondary | ICD-10-CM | POA: Diagnosis not present

## 2015-02-22 DIAGNOSIS — F331 Major depressive disorder, recurrent, moderate: Secondary | ICD-10-CM

## 2015-02-22 MED ORDER — BUPROPION HCL ER (XL) 150 MG PO TB24: ORAL_TABLET | ORAL | Status: DC

## 2015-02-22 NOTE — Progress Notes (Signed)
[facility] MD Progress Note  02/22/2015 2:34 PM [name]  MRN:  [id number] Subjective: Much better Today the patient says she is much improved. She feels a distinct improvement in terms of her mood. Her appetite is only slightly improved and she continues to lose weight. 6 months ago she weighed 187 pounds and today she weighs 137 pounds. The patient oh longer feels worthless. She is sleeping better. She's now beginning to enjoy things again. Her son has got a new job which she thinks helps a lot. The patient was unable to take Ativan isn't oversedated her. The patient did successfully increase her Wellbutrin to 450 mg. The other significant medicine she is on his Topamax for migraine headaches which I think might be affecting her appetite. Nonetheless of this time the patient is not suicidal and showed a distinct improvement. This patient will continue taking Wellbutrin at 450 mg. Medically she is stable. She denies use of alcohol or drugs she shows no evidence of psychosis. She is not suicidal.  Principal Problem: Major depression, chronic mild  Diagnosis:  Major depression, chronic mild  Patient Active Problem List   Diagnosis Date Noted  . Chronic diastolic CHF (congestive heart failure), NYHA class 2 [I50.32] 01/24/2015  . Demand ischemia secondary to AF with RVR [I24.8] 12/13/2014  . CKD (chronic kidney disease), stage III [N18.3] 11/11/2014  . Hypokalemia [E87.6] 05/22/2014  . Mood disorder [F39] 03/20/2014  . HTN (hypertension) [I10] 03/20/2014  . Anemia, unspecified [D64.9] 03/20/2014  . Tobacco use disorder [Z72.0] 03/20/2014  . Atrial fibrillation with RVR [I48.91] 03/03/2014  . Hypotension [I95.9] 03/03/2014  . Pulmonary hypertension [I27.0] 03/03/2014  . COPD (chronic obstructive pulmonary disease) [J44.9] 03/03/2014  . History of right MCA stroke [Z86.73]   . Long-term (current) use of anticoagulants [Z79.01]   . Cardiomyopathy-h/o tachycardia mediated-EF 65% per echo March 2016  [I42.9] 02/18/2014  . Hyperlipidemia [E78.5] 02/18/2014  . CAD '07, LAD PCI 2012, SVG-PDA PTCA 11/10/14 [I25.10] 02/16/2014  . Homelessness [Z59.0] 11/12/2011  . Chronic diastolic heart failure [I50.32]   . Pulmonary nodules [R91.8]   . Hypothyroidism [E03.9]   . OSA- C-pap intol [G47.33]    Total Time spent with patient: 30 minutes   Past Medical History:  Past Medical History  Diagnosis Date  . CAD (coronary artery disease)     a.  cath 09/2010: LAD stent patent, S-Int/dCFX ok, S-PDA ok, L-LAD atretic;  b. Lexiscan Myoview (02/2014):  no ischemia, EF 55%; c. 11/2014 Cath/PCI: LM nl, LAD 20pISR, LCX 80-71m, OM1 nl, RI 70p, RCA 40-90m, RPDA 95ost/95-4m (PTCA only w/ reduction to 50p/67m), 60d, L->LAD atretic, VG->RI->OM nl, VG->PDA 100p.  [name] Hypertension   . Hx of CABG   . Hx of transesophageal echocardiography (TEE) for monitoring 11/2010    TEE 11/2010: EF 60-65%, BAE, trivial atrial septal shunt;  right heart cath in 10/2010 with elevated R and L heart pressures and diuretic started  . HLD (hyperlipidemia)   . Hypothyroidism   . COPD (chronic obstructive pulmonary disease)   . Pulmonary nodules     repeat CT due in 11/2011  . Acute right MCA stroke 11/07/10  . 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 Eczema   . Depression   . Atrial fibrillation     a. s/p TEE-DCCV 02/2104; b. Xarelto started  . Cardiomyopathy with EF 40% at TEE 02/17/14 (likely tachycardia mediated - Myoview 02/19/14 neg for ischemia with normal EF) 02/18/2014  .  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 Sleep apnea     recent sleep study  in 04/2014 per chart review  shows no significant OSA  . Dysrhythmia 10/2014    atrial fib with rvr  . GERD (gastroesophageal reflux disease)   . Headache     Past Surgical History  Procedure Laterality Date  . Coronary artery bypass graft    . Debriment for infection in chest    . Chest wall reconstruction    . Bilateral knee surgery     . Bladder surgery    . Left mastoidectomy    . Hernia repair    . Cath 2012    . Tee without cardioversion N/A 02/17/2014    Procedure: TRANSESOPHAGEAL ECHOCARDIOGRAM (TEE);  Surgeon: Pricilla Riffle, MD;  Location: [facility] ENDOSCOPY;  Service: Cardiovascular;  Laterality: N/A;  . Cardioversion N/A 02/17/2014    Procedure: CARDIOVERSION;  Surgeon: Pricilla Riffle, MD;  Location: [facility] ENDOSCOPY;  Service: Cardiovascular;  Laterality: N/A;  . Left and right heart catheterization with coronary angiogram N/A 11/10/2014    Procedure: LEFT AND RIGHT HEART CATHETERIZATION WITH CORONARY ANGIOGRAM;  Surgeon: Marykay Lex, MD;  Location: [facility] CATH LAB;  Service: Cardiovascular;  Laterality: N/A;   Family History:  Family History  Problem Relation Age of Onset  . COPD Mother   . Heart disease Mother   . Arthritis Mother     Rheumatoid and PMR  . Osteoporosis Mother     Mom fractured hip  . Diabetes type II Mother   . Depression Mother   . Heart attack Father   . Depression Father   . Hypertension Father   . Alcohol abuse Father   . Depression Sister   . Anxiety disorder Sister   . Drug abuse Sister   . Stroke Neg Hx    Social History:  History  Alcohol Use No     History  Drug Use No    History   Social History  . Marital Status: Divorced    Spouse Name: N/A  . Number of Children: 2  . Years of Education: N/A   Occupational History  . DISABLE     Social History Main Topics  . Smoking status: Former Smoker -- 0.25 packs/day for 30 years    Types: Cigarettes    Quit date: 09/10/2014  . Smokeless tobacco: Never Used  . Alcohol Use: No  . Drug Use: No  . Sexual Activity: Not Currently   Other Topics Concern  . None   Social History Narrative   Divorced   After stroke was living with sister and mother after sister asked her to leave. Mom has since deceased    Difficulties over control.    Then moved back in for about 6 weeks.   . tranportaion difficult son  helping   Ex-smoker    Was living at friends  Sister and her had argument and she was told to leave .    She was living in a homeless shelter for the last 3 weeks [facility]    son had help with transportation and medication       Work status regular  before got sick on short-term disability now lost t insurance after the stroke and couldn't work was  denied ssi    2 times.  She is not eligible for Medicaid because she doesn't have dependent children.  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   Lives in  rented house 2 house mates males keep tp self has her own b room  Doing well    Additional History:    Sleep: Good  Appetite:  Good   Assessment:   Musculoskeletal: Strength & Muscle Tone:  Gait & Station:  Patient leans:    Psychiatric Specialty Exam: Physical Exam  ROS  Blood pressure 104/68, pulse 60, height  (1.626 m), weight 137 lb 6.4 oz (62.324 kg).Body mass index is 23.57 kg/(m^2).  General Appearance: Casual  Eye Contact::  Good  Speech:  Clear and Coherent  Volume:  Normal  Mood:  NA  Affect:  NA  Thought Process:  Coherent  Orientation:  Full (Time, Place, and Person)  Thought Content:  WDL  Suicidal Thoughts:  No  Homicidal Thoughts:  No  Memory:  NA  Judgement:  NA  Insight:  NA and Good  Psychomotor Activity:  Normal  Concentration:  Good  Recall:  Good  Fund of Knowledge:Good  Language: Good  Akathisia:  No  Handed:  Right  AIMS (if indicated):     Assets:  Communication Skills  ADL's:  Intact  Cognition: WNL  Sleep:        Current Medications: Current Outpatient Prescriptions  Medication Sig Dispense Refill  . albuterol (PROVENTIL HFA;VENTOLIN HFA) 108 (90 BASE) MCG/ACT inhaler Inhale 2 puffs into the lungs every 6 (six) hours as  needed for wheezing or shortness of breath. 1 Inhaler 2  . amiodarone (PACERONE) 200 MG tablet Take 1 tablet (200 mg total) by mouth daily. 30 tablet 4  . buPROPion (WELLBUTRIN XL) 150 MG 24 hr tablet 3 qam 90 tablet 5  . clopidogrel (PLAVIX) 75 MG tablet Take 1 tablet (75 mg total) by mouth daily with breakfast. 30 tablet 6  . feeding supplement, ENSURE COMPLETE, (ENSURE COMPLETE) LIQD Take 237 mLs by mouth 3 (three) times daily between meals. 30 Bottle 0  . levothyroxine (SYNTHROID, LEVOTHROID) 125 MCG tablet Take 1 tablet (125 mcg total) by mouth daily. 30 tablet 11  . nitroGLYCERIN (NITROSTAT) 0.4 MG SL tablet Place 1 tablet (0.4 mg total) under the tongue every 5 (five) minutes as needed for chest pain. 90 tablet 3  . ondansetron (ZOFRAN-ODT) 4 MG disintegrating tablet DISSOLVE 1 TABLET ON THE TONGUE EVERY 8 HOURS AS NEEDED FOR NAUSEA 10 tablet 0  . potassium chloride SA (K-DUR,KLOR-CON) 20 MEQ tablet Take 1 tablet (20 mEq total) by mouth 3 (three) times daily. 90 tablet 6  . rivaroxaban (XARELTO) 20 MG TABS tablet Take 1 tablet (20 mg total) by mouth daily with supper. 30 tablet 3  . tamsulosin (FLOMAX) 0.4 MG CAPS capsule Take 1 capsule (0.4 mg total) by mouth daily. 30 capsule 0  . topiramate (TOPAMAX) 50 MG tablet Take 1 tablet (50 mg total) by mouth 2 (two) times daily. 90 tablet 1  . traMADol (ULTRAM) 50 MG tablet TAKE 2 TABLETS BY MOUTH EVERY 6 HOURS AS NEEDED FOR MODERATE PAIN 20 tablet 0  . atorvastatin (LIPITOR) 80 MG tablet Take 0.5 tablets (40 mg total) by mouth daily. (Patient not taking: Reported on 02/22/2015) 30 tablet 11   No current facility-administered medications for this visit.  Lab Results: No results found for this or any previous visit (from the past 48 hour(s)).  Physical Findings: AIMS:  , ,  ,  ,    CIWA:    COWS:     Treatment Plan Summary: At this time the patient is doing better.   Medical Decision Making:  Established Problem, Stable/Improving  (1)     [name] 02/22/2015, 2:34 PM

## 2015-02-22 NOTE — Telephone Encounter (Signed)
Called Patient, scheduled US Carotid.  Patient verbalized understanding. 

## 2015-02-24 ENCOUNTER — Encounter: Payer: Self-pay | Admitting: Internal Medicine

## 2015-02-24 ENCOUNTER — Ambulatory Visit (INDEPENDENT_AMBULATORY_CARE_PROVIDER_SITE_OTHER): Payer: Medicare Other | Admitting: Internal Medicine

## 2015-02-24 VITALS — BP 138/84 | Temp 97.9°F | Ht 64.0 in | Wt 138.3 lb

## 2015-02-24 DIAGNOSIS — Z87898 Personal history of other specified conditions: Secondary | ICD-10-CM

## 2015-02-24 DIAGNOSIS — I25810 Atherosclerosis of coronary artery bypass graft(s) without angina pectoris: Secondary | ICD-10-CM

## 2015-02-24 DIAGNOSIS — Z789 Other specified health status: Secondary | ICD-10-CM

## 2015-02-24 DIAGNOSIS — Z79899 Other long term (current) drug therapy: Secondary | ICD-10-CM

## 2015-02-24 DIAGNOSIS — E441 Mild protein-calorie malnutrition: Secondary | ICD-10-CM | POA: Diagnosis not present

## 2015-02-24 DIAGNOSIS — I6930 Unspecified sequelae of cerebral infarction: Secondary | ICD-10-CM | POA: Diagnosis not present

## 2015-02-24 DIAGNOSIS — R269 Unspecified abnormalities of gait and mobility: Secondary | ICD-10-CM

## 2015-02-24 DIAGNOSIS — Z7409 Other reduced mobility: Secondary | ICD-10-CM

## 2015-02-24 DIAGNOSIS — L819 Disorder of pigmentation, unspecified: Secondary | ICD-10-CM

## 2015-02-24 NOTE — Progress Notes (Signed)
Pre visit review using our clinic review tool, if applicable. No additional management support is needed unless otherwise documented below in the visit note.  Chief Complaint  Patient presents with  . Follow-up    multiple issues    HPI: [name] 59 y.o. Chronic disease management and complex medial situation.arrived after appt time cause of transportaion issues  . Has form for scat to help with this. Since last visit has seen neuro, cards, psych and all things considered improving . Eating better after getting used to "disgusting odor" in fridge. Only ocass vomiting now  Wraps things up .   No falling walks unsteady  With cane no walker .   Didt get  Derm check  First appt never got message and was a no show so couldn't go the the may appt .   ROS: See pertinent positives and negatives per HPI. No new cps has chornic sob  No new neur sx  No hypotension at this time recnet dry mouth increase water  No med change  Past Medical History  Diagnosis Date  . CAD (coronary artery disease)     a.  cath 09/2010: LAD stent patent, S-Int/dCFX ok, S-PDA ok, L-LAD atretic;  b. Lexiscan Myoview (02/2014):  no ischemia, EF 55%; c. 11/2014 Cath/PCI: LM nl, LAD 20pISR, LCX 80-1m, OM1 nl, RI 70p, RCA 40-66m, RPDA 95ost/95-60m (PTCA only w/ reduction to 50p/30m), 60d, L->LAD atretic, VG->RI->OM nl, VG->PDA 100p.  [name] Hypertension   . Hx of CABG   . Hx of transesophageal echocardiography (TEE) for monitoring 11/2010    TEE 11/2010: EF 60-65%, BAE, trivial atrial septal shunt;  right heart cath in 10/2010 with elevated R and L heart pressures and diuretic started  . HLD (hyperlipidemia)   . Hypothyroidism   . COPD (chronic obstructive pulmonary disease)   . Pulmonary nodules     repeat CT due in 11/2011  . Acute right MCA stroke 11/07/10  . 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 Eczema   . Depression   . Atrial fibrillation     a. s/p TEE-DCCV 02/2104; b. Xarelto started  .  Cardiomyopathy with EF 40% at TEE 02/17/14 (likely tachycardia mediated - Myoview 02/19/14 neg for ischemia with normal EF) 02/18/2014  . 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 Sleep apnea     recent sleep study  in 04/2014 per chart review  shows no significant OSA  . Dysrhythmia 10/2014    atrial fib with rvr  . GERD (gastroesophageal reflux disease)   . Headache     Family History  Problem Relation Age of Onset  . COPD Mother   . Heart disease Mother   . Arthritis Mother     Rheumatoid and PMR  . Osteoporosis Mother     Mom fractured hip  . Diabetes type II Mother   . Depression Mother   . Heart attack Father   . Depression Father   . Hypertension Father   . Alcohol abuse Father   . Depression Sister   . Anxiety disorder Sister   . Drug abuse Sister   . Stroke Neg Hx     History   Social History  . Marital Status: Divorced    Spouse Name: N/A  . Number of Children: 2  . Years of Education: N/A   Occupational History  . DISABLE     Social  History Main Topics  . Smoking status: Former Smoker -- 0.25 packs/day for 30 years    Types: Cigarettes    Quit date: 09/10/2014  . Smokeless tobacco: Never Used  . Alcohol Use: No  . Drug Use: No  . Sexual Activity: Not Currently   Other Topics Concern  . None   Social History Narrative   Divorced   After stroke was living with sister and mother after sister asked her to leave. Mom has since deceased    Difficulties over control.    Then moved back in for about 6 weeks.   . tranportaion difficult son  helping   Ex-smoker   Was living at friends  Sister and her had argument and she was told to leave .    She was living in a homeless shelter for the last 3 weeks [facility]    son had help with transportation and medication       Work status regular  before got sick on short-term disability now lost t insurance after the stroke and couldn't work was  denied ssi    2 times.  She is not  eligible for Medicaid because she doesn't have dependent children.         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   Lives in  rented house 2 house mates males keep tp self has her own b room  Doing well     Outpatient Prescriptions Prior to Visit  Medication Sig Dispense Refill  . albuterol (PROVENTIL HFA;VENTOLIN HFA) 108 (90 BASE) MCG/ACT inhaler Inhale 2 puffs into the lungs every 6 (six) hours as needed for wheezing or shortness of breath. (Patient not taking: Reported on 02/24/2015) 1 Inhaler 2  . amiodarone (PACERONE) 200 MG tablet Take 1 tablet (200 mg total) by mouth daily. 30 tablet 4  . atorvastatin (LIPITOR) 80 MG tablet Take 0.5 tablets (40 mg total) by mouth daily. (Patient not taking: Reported on 02/22/2015) 30 tablet 11  . buPROPion (WELLBUTRIN XL) 150 MG 24 hr tablet 3 qam 90 tablet 5  . clopidogrel (PLAVIX) 75 MG tablet Take 1 tablet (75 mg total) by mouth daily with breakfast. 30 tablet 6  . feeding supplement, ENSURE COMPLETE, (ENSURE COMPLETE) LIQD Take 237 mLs by mouth 3 (three) times daily between meals. (Patient not taking: Reported on 02/24/2015) 30 Bottle 0  . levothyroxine (SYNTHROID, LEVOTHROID) 125 MCG tablet Take 1 tablet (125 mcg total) by mouth daily. 30 tablet 11  . nitroGLYCERIN (NITROSTAT) 0.4 MG SL tablet Place 1 tablet (0.4 mg total) under the tongue every 5 (five) minutes as needed for chest pain. (Patient not taking: Reported on 02/24/2015) 90 tablet 3  . ondansetron (ZOFRAN-ODT) 4 MG disintegrating tablet DISSOLVE 1 TABLET ON THE TONGUE EVERY 8 HOURS AS NEEDED FOR NAUSEA (Patient not taking: Reported on 02/24/2015) 10 tablet 0  . potassium chloride SA (K-DUR,KLOR-CON) 20 MEQ tablet Take 1 tablet (20 mEq total) by mouth 3 (three) times daily. 90 tablet  6  . rivaroxaban (XARELTO) 20 MG TABS tablet Take 1 tablet (20 mg total) by mouth daily with supper. 30 tablet 3  . tamsulosin (FLOMAX) 0.4 MG CAPS capsule Take 1 capsule (0.4 mg total) by mouth daily. 30 capsule 0  .  topiramate (TOPAMAX) 50 MG tablet Take 1 tablet (50 mg total) by mouth 2 (two) times daily. 90 tablet 1  . traMADol (ULTRAM) 50 MG tablet TAKE 2 TABLETS BY MOUTH EVERY 6 HOURS AS NEEDED FOR MODERATE PAIN 20 tablet 0   No facility-administered medications prior to visit.     EXAM:  BP 138/84 mmHg  Temp(Src) 97.9 F (36.6 C) (Oral)  Ht  (1.626 m)  Wt 138 lb 4.8 oz (62.732 kg)  BMI 23.73 kg/m2  Body mass index is 23.73 kg/(m^2).  Wt Readings from Last 3 Encounters:  02/24/15 138 lb 4.8 oz (62.732 kg)  02/22/15 137 lb 6.4 oz (62.324 kg)  02/03/15 140 lb 3.2 oz (63.594 kg)     GENERAL: vitals reviewed and listed above, alert, oriented, appears well hydrated and in no acute distress looks malnourished but congitivly intact .  HEENT: atraumatic, conjunctiva  clear, no obvious abnormalities on inspection of external nose and ears  Has lip mannerisms   Dystonia?  NECK: no obvious masses on inspection palpation  LUNGS: clear to auscultation bilaterally, no wheezes, rales or rhonchi, dec bs skin abnormal mole on back 3-4  mm irreg  And ? Black  CV: HRRR, no clubbing cyanosis or  peripheral edema nl cap refill  MS: moves all extremities without noticeable focal  Abnormality  Gait  Is abnormal flat and almost leaning back  Without cane of with  PSYCH: pleasant and cooperative, no obvious anxiety Lab Results  Component Value Date   WBC 5.9 12/28/2014   HGB 14.2 12/28/2014   HCT 41.5 12/28/2014   PLT 224.0 12/28/2014   GLUCOSE 70 02/03/2015   CHOL 237* 11/16/2013   TRIG 85 11/16/2013   HDL 68 11/16/2013   LDLDIRECT 153.0 01/15/2010   LDLCALC 152* 11/16/2013   ALT 10 12/10/2014   AST 18 12/10/2014   NA 139 02/03/2015   K 4.3 02/03/2015   CL 112 02/03/2015    CREATININE 1.16 02/03/2015   BUN 14 02/03/2015   CO2 21 02/03/2015   TSH 3.109 12/10/2014   INR 1.2* 11/08/2014   HGBA1C  02/11/2011    5.4 (NOTE)                                                                       According to the ADA Clinical Practice Recommendations for 2011, when HbA1c is used as a screening test:   >=6.5%   Diagnostic of Diabetes Mellitus           (if abnormal result  is confirmed)  5.7-6.4%   Increased risk of developing Diabetes Mellitus  References:Diagnosis and Classification of Diabetes Mellitus,Diabetes Care,2011,34(Suppl 1):S62-S69 and Standards of Medical Care in         Diabetes - 2011,Diabetes Care,2011,34  (Suppl 1):S11-S61.    ASSESSMENT AND PLAN:  Discussed the following assessment and plan:  Mild malnutrition - eating better  weight mantained  counseling  Medically complex patient - compelted and signed form for scat   Mobility impaired  Medication management  Pigmented skin lesion of uncertain nature - work on derm eval didnt know. about appt   History of stroke with residual deficit  Abnormality of gait - no recenet fall Overdue for lipid panel  -  Patient advised to return or notify health care team  if symptoms worsen ,persist or new concerns arise. After patient left noted  Low b12 in hospital and uncertain if still taking supplement   Plan recheck next lab   Patient Instructions   Still need the mole to be checked . Scat form . Continue trying  Nutrition.   Need lipid panel  With next blood draw  If cardiology dose this. ROV in 4 months or as needed   Lab Results  Component Value Date   WBC 5.9 12/28/2014   HGB 14.2 12/28/2014   HCT 41.5 12/28/2014   PLT 224.0 12/28/2014   GLUCOSE 70 02/03/2015   CHOL 237* 11/16/2013   TRIG 85 11/16/2013   HDL 68 11/16/2013   LDLDIRECT 153.0 01/15/2010   LDLCALC 152* 11/16/2013   ALT 10 12/10/2014   AST 18 12/10/2014   NA 139 02/03/2015   K 4.3 02/03/2015   CL 112 02/03/2015    CREATININE 1.16 02/03/2015   BUN 14 02/03/2015   CO2 21 02/03/2015   TSH 3.109 12/10/2014   INR 1.2* 11/08/2014   HGBA1C  02/11/2011    5.4 (NOTE)                                                                       According to the ADA Clinical Practice Recommendations for 2011, when HbA1c is used as a screening test:   >=6.5%   Diagnostic of Diabetes Mellitus           (if abnormal result  is confirmed)  5.7-6.4%   Increased risk of developing Diabetes Mellitus  References:Diagnosis and Classification of Diabetes Mellitus,Diabetes Care,2011,34(Suppl 1):S62-S69 and Standards of Medical Care in         Diabetes - 2011,Diabetes Care,2011,34  (Suppl 1):S11-S61.        Neta Mends. Panosh M.D.  ASSESSMENT: 23 year lady with long-standing history of migraine headaches with now mixed tension and migraine headaches which have increased in frequency and severity over the last 3-4 months. Remote history of right MCA infarct in February 2012 and TIA in June 2012 with mild residual left sided weakness. Multiple risk factors of hypertension, hyperlipidemia, coronary artery disease and sleep apnoea.   PLAN: I had a long d/w patient about her remote stroke, risk for recurrent stroke/TIAs, personally independently reviewed imaging studies and stroke evaluation results and answered questions.Continue xarelto for atrial fibrillation for secondary stroke prevention and maintain strict control of hypertension with blood pressure goal below 130/90, diabetes with hemoglobin A1c goal below 6.5% and lipids with LDL cholesterol goal below 100 mg/dL. check screening follow-up carotid ultrasound study. Reduce Topamax to 50 mg twice daily as migraine seemed quite well controlled. Followup in the future with me in one year or call earlier if necessary.  Delia Heady, MD  ASSESSMENT AND PLAN:  Coronary artery disease involving autologous vein coronary bypass graft without angina pectoris Improved symptoms. ECG  shows unchanged negative T waves in the anterolateral and inferior leads.  - Continue Plavix in addition to Xarelto, statin. ASA was stopped 30 days post PCI. - Consider adding ACE inhibitor back over time. - unable to add betablocker as she is bradycardic  Orthostatic hypotension - resolved after holding  diuretics  PAF (paroxysmal atrial fibrillation) Maintaining NSR. She remains on Amiodarone and Xarelto. We will go back to 20 mg of Xarelto. Chronic diastolic heart failure She is euvolemic off diuretics, I would continue holding as she just recovered from acute kidney failure  Essential hypertension Controlled.  Hyperlipidemia Continue statin.  CKD (chronic kidney disease), unspecified stage Resolved with holding diuretics and ACEI, i would not restart at this time   Depression She admits to remaining in her room "all the time." She does not feel like coming out. She has some issues with balance probably related to low BP. However, I have asked her to FU with psych sooner if her depressive symptoms worsen.   Follow up in 4 months.   Lars Masson 01/24/2015

## 2015-02-24 NOTE — Patient Instructions (Addendum)
Still need the mole to be checked . Scat form . Continue trying  Nutrition.   Need lipid panel  With next blood draw  If cardiology dose this. ROV in 4 months or as needed   Lab Results  Component Value Date   WBC 5.9 12/28/2014   HGB 14.2 12/28/2014   HCT 41.5 12/28/2014   PLT 224.0 12/28/2014   GLUCOSE 70 02/03/2015   CHOL 237* 11/16/2013   TRIG 85 11/16/2013   HDL 68 11/16/2013   LDLDIRECT 153.0 01/15/2010   LDLCALC 152* 11/16/2013   ALT 10 12/10/2014   AST 18 12/10/2014   NA 139 02/03/2015   K 4.3 02/03/2015   CL 112 02/03/2015   CREATININE 1.16 02/03/2015   BUN 14 02/03/2015   CO2 21 02/03/2015   TSH 3.109 12/10/2014   INR 1.2* 11/08/2014   HGBA1C  02/11/2011    5.4 (NOTE)                                                                       According to the ADA Clinical Practice Recommendations for 2011, when HbA1c is used as a screening test:   >=6.5%   Diagnostic of Diabetes Mellitus           (if abnormal result  is confirmed)  5.7-6.4%   Increased risk of developing Diabetes Mellitus  References:Diagnosis and Classification of Diabetes Mellitus,Diabetes Care,2011,34(Suppl 1):S62-S69 and Standards of Medical Care in         Diabetes - 2011,Diabetes Care,2011,34  (Suppl 1):S11-S61.

## 2015-02-27 ENCOUNTER — Telehealth: Payer: Self-pay | Admitting: *Deleted

## 2015-02-27 DIAGNOSIS — E785 Hyperlipidemia, unspecified: Secondary | ICD-10-CM

## 2015-02-27 DIAGNOSIS — E639 Nutritional deficiency, unspecified: Secondary | ICD-10-CM

## 2015-02-27 DIAGNOSIS — I25810 Atherosclerosis of coronary artery bypass graft(s) without angina pectoris: Secondary | ICD-10-CM

## 2015-02-27 DIAGNOSIS — I5032 Chronic diastolic (congestive) heart failure: Secondary | ICD-10-CM

## 2015-02-27 DIAGNOSIS — I10 Essential (primary) hypertension: Secondary | ICD-10-CM

## 2015-02-27 DIAGNOSIS — I480 Paroxysmal atrial fibrillation: Secondary | ICD-10-CM

## 2015-02-27 NOTE — Telephone Encounter (Signed)
Could you please schedule lipid panel, CMP and B12 to her next lab testing?    Thank you,    KN        ----- Message -----     From: Madelin Headings, MD     Sent: 02/24/2015 10:16 PM      To: Lars Masson, MD        When you get blood tests at next visit She is due for lipid panel and could you get a b12 level.     Would be good if you could order that Also     Her nutrition if poor although improving and was on a supplement last level low in hospital.     Thanks       [facility]   02/27/15 Pt states she does not have transportation and cannot come for lab until the first week in July. Pt advised fasting lipid profile/BMET/Liver profile/ B12 scheduled for 03/14/15.

## 2015-03-14 ENCOUNTER — Other Ambulatory Visit (INDEPENDENT_AMBULATORY_CARE_PROVIDER_SITE_OTHER): Payer: Medicare Other | Admitting: *Deleted

## 2015-03-14 DIAGNOSIS — I10 Essential (primary) hypertension: Secondary | ICD-10-CM

## 2015-03-14 DIAGNOSIS — I5032 Chronic diastolic (congestive) heart failure: Secondary | ICD-10-CM | POA: Diagnosis not present

## 2015-03-14 DIAGNOSIS — I480 Paroxysmal atrial fibrillation: Secondary | ICD-10-CM | POA: Diagnosis not present

## 2015-03-14 DIAGNOSIS — Z79899 Other long term (current) drug therapy: Secondary | ICD-10-CM

## 2015-03-14 DIAGNOSIS — E785 Hyperlipidemia, unspecified: Secondary | ICD-10-CM

## 2015-03-14 DIAGNOSIS — I25810 Atherosclerosis of coronary artery bypass graft(s) without angina pectoris: Secondary | ICD-10-CM

## 2015-03-14 DIAGNOSIS — E639 Nutritional deficiency, unspecified: Secondary | ICD-10-CM

## 2015-03-14 LAB — BASIC METABOLIC PANEL
BUN: 16 mg/dL (ref 6–23)
CO2: 23 mEq/L (ref 19–32)
Calcium: 8.9 mg/dL (ref 8.4–10.5)
Chloride: 108 mEq/L (ref 96–112)
Creatinine, Ser: 1.17 mg/dL (ref 0.40–1.20)
GFR: 50.6 mL/min — ABNORMAL LOW (ref >60.00)
Glucose, Bld: 84 mg/dL (ref 70–99)
Potassium: 3.6 mEq/L (ref 3.5–5.1)
Sodium: 140 mEq/L (ref 135–145)

## 2015-03-14 LAB — HEPATIC FUNCTION PANEL
ALT: 7 U/L (ref 0–35)
AST: 11 U/L (ref 0–37)
Albumin: 3.8 g/dL (ref 3.5–5.2)
Alkaline Phosphatase: 58 U/L (ref 39–117)
Bilirubin, Direct: 0.2 mg/dL (ref 0.0–0.3)
Total Bilirubin: 0.7 mg/dL (ref 0.2–1.2)
Total Protein: 6.6 g/dL (ref 6.0–8.3)

## 2015-03-14 LAB — LIPID PANEL
Cholesterol: 210 mg/dL — ABNORMAL HIGH (ref 0–200)
HDL: 51 mg/dL (ref >39.00)
LDL Cholesterol: 143 mg/dL — ABNORMAL HIGH (ref 0–99)
NonHDL: 159
Total CHOL/HDL Ratio: 4
Triglycerides: 82 mg/dL (ref 0.0–149.0)
VLDL: 16.4 mg/dL (ref 0.0–40.0)

## 2015-03-14 LAB — VITAMIN B12: Vitamin B-12: 164 pg/mL — ABNORMAL LOW (ref 211–911)

## 2015-03-14 NOTE — Addendum Note (Signed)
Addended by: Tonita Phoenix on: 03/14/2015 11:59 AM   Modules accepted: Orders

## 2015-03-15 ENCOUNTER — Telehealth: Payer: Self-pay | Admitting: *Deleted

## 2015-03-15 ENCOUNTER — Ambulatory Visit (INDEPENDENT_AMBULATORY_CARE_PROVIDER_SITE_OTHER): Payer: Medicare Other

## 2015-03-15 DIAGNOSIS — I6529 Occlusion and stenosis of unspecified carotid artery: Secondary | ICD-10-CM

## 2015-03-15 MED ORDER — CYANOCOBALAMIN 2000 MCG PO TABS: 1000.0000 ug | ORAL_TABLET | Freq: Every day | ORAL | Status: DC

## 2015-03-15 NOTE — Telephone Encounter (Signed)
Informed the pt that per Dr Delton See her labs indicate her LDL is significantly elevated and she should increase her atorvastatin to 80 mg daily  and she should start taking OTC B12 as her level is low. Per the pt she has never started taking her atorvastatin as recommended by Dr Delton See, because she states the pills were too large for her to swallow.  Pt states she asked her pharmacist if she could break this medication, but was advised not too.  Pt would like for Dr Delton See to advise on a different statin regimen, that's easy to swallow, and is cost effective.  Informed the pt that Dr Delton See is currently out of the office this week, but will route this message to her for further review and recommendation of a new statin that's easy to swallow and is cost effective.  Informed the pt she should start taking OTC B12, and she can obtain this at any local pharmacy, for her B12 has decreased and is low.  Pt verbalized understanding and agrees with the plan mentioned above.

## 2015-03-16 NOTE — Telephone Encounter (Signed)
Kennon Rounds, Could you advise me here? The patient states that she cannot swallow atorvastatin, is there any smaller pill than that? Thank you, Aris Lot

## 2015-03-24 MED ORDER — ATORVASTATIN CALCIUM 20 MG PO TABS
20.0000 mg | ORAL_TABLET | Freq: Every day | ORAL | Status: DC
Start: 2015-03-24 — End: 2015-05-16

## 2015-03-24 NOTE — Telephone Encounter (Signed)
Spoke with our Pharmacist Kennon Rounds about atorvastatin 80 mg pill size, and the pt being unable to swallow this due to the size of this pill.  Per Kennon Rounds the only option is to call in atorvastatin 20 mg, for it does appear to come in a smaller size, and have the pt go to her pharmacy and ask the pharmacist to view the size of the pill before filling it.   Informed the pt that I will cancel out the 80 mg atorvastatin and call her in atorvastatin 20 mg po daily, and inform Dr Delton See of this.  Told the pt after she views the size of the atorvastatin 20 mg tab, and decides to fill this, to call our office back to inform us of this.  Pt states she will be going to her pharmacy tomorrow, due to transportation issues, and will call our office back sometime next week, to inform us of her decision.  Pt states she has never even started taking atorvastatin 40 mg po daily as previously advised by Dr Delton See, before the increase to 80 mg,  for that pill was too large as well. Pt agrees with the plan mentioned above.

## 2015-03-27 ENCOUNTER — Telehealth: Payer: Self-pay | Admitting: Cardiology

## 2015-03-27 NOTE — Telephone Encounter (Signed)
Spoke with the pt to inform her that she is to be taking atorvastatin 20 mg po daily.  Informed the pt that she will only be taking one pill a day of her atorvastatin.  Pt states that she can swallow the atorvastatin 20 mg tabs with no difficulty. Pt verbalized understanding, agrees with this plan, and gracious for all the assistance provided.

## 2015-03-27 NOTE — Telephone Encounter (Signed)
New message      Pt is confused about the dosage on the atovastatin.  Please call

## 2015-03-27 NOTE — Telephone Encounter (Signed)
Left message for the pt to call back.

## 2015-03-29 ENCOUNTER — Telehealth (HOSPITAL_COMMUNITY): Payer: Self-pay

## 2015-03-29 NOTE — Telephone Encounter (Signed)
Met with Dr. Casimiro Needle who reviewed patient's past history and new reported symptom.  Called patient back and informed Dr. Casimiro Needle would like for patient to get checked out medically due to concerns this was a completely new symptom and unlikely due to medication and no history of psychosis.  Requested patient follow-up with an urgent care or medical evaluation as soon as possible, today preferably as patient stated she agreed with plan and would have to work out transportation to go.  Patient agreed to call this nurse back if any problems arranging to go for an evaluation and after to inform of status.

## 2015-03-29 NOTE — Telephone Encounter (Signed)
Telephone call with patient to follow up on message she left requesting to speak to Dr. Donell Beers.  Patient stated she had began to see things, which started as numbers moving on 03/24/15 but now has turned into spiders, particularly late in the evenings and states she is "deafly afraid of spiders".  Patient denied any alcohol or substance use as admitted the first time it occurred she was taking some pain medication but reports stopped and visual hallucinations have continued and mostly spiders being seen now.  Patient requests to discuss with Dr. Donell Beers as a new symptom she is concerned about.

## 2015-03-31 ENCOUNTER — Ambulatory Visit (INDEPENDENT_AMBULATORY_CARE_PROVIDER_SITE_OTHER): Payer: Medicare Other | Admitting: Physician Assistant

## 2015-03-31 VITALS — BP 102/70 | HR 54 | Temp 98.2°F | Resp 18 | Ht 64.0 in | Wt 127.0 lb

## 2015-03-31 DIAGNOSIS — E0590 Thyrotoxicosis, unspecified without thyrotoxic crisis or storm: Secondary | ICD-10-CM

## 2015-03-31 DIAGNOSIS — E538 Deficiency of other specified B group vitamins: Secondary | ICD-10-CM | POA: Diagnosis not present

## 2015-03-31 DIAGNOSIS — R54 Age-related physical debility: Secondary | ICD-10-CM

## 2015-03-31 DIAGNOSIS — F19951 Other psychoactive substance use, unspecified with psychoactive substance-induced psychotic disorder with hallucinations: Secondary | ICD-10-CM

## 2015-03-31 LAB — COMPLETE METABOLIC PANEL WITH GFR
ALT: 10 U/L (ref 0–35)
AST: 13 U/L (ref 0–37)
Albumin: 3.5 g/dL (ref 3.5–5.2)
Alkaline Phosphatase: 69 U/L (ref 39–117)
BUN: 19 mg/dL (ref 6–23)
CALCIUM: 8.3 mg/dL — AB (ref 8.4–10.5)
CHLORIDE: 104 meq/L (ref 96–112)
CO2: 23 mEq/L (ref 19–32)
Creat: 1.07 mg/dL (ref 0.50–1.10)
GFR, EST NON AFRICAN AMERICAN: 58 mL/min — AB
GFR, Est African American: 67 mL/min
GLUCOSE: 82 mg/dL (ref 70–99)
Potassium: 2.8 mEq/L — ABNORMAL LOW (ref 3.5–5.3)
Sodium: 140 mEq/L (ref 135–145)
Total Bilirubin: 0.5 mg/dL (ref 0.2–1.2)
Total Protein: 6.3 g/dL (ref 6.0–8.3)

## 2015-03-31 LAB — CBC WITH DIFFERENTIAL/PLATELET
BASOS ABS: 0 10*3/uL (ref 0.0–0.1)
Basophils Relative: 0 % (ref 0–1)
EOS ABS: 0.1 10*3/uL (ref 0.0–0.7)
EOS PCT: 1 % (ref 0–5)
HCT: 32.5 % — ABNORMAL LOW (ref 36.0–46.0)
HEMOGLOBIN: 10.8 g/dL — AB (ref 12.0–15.0)
LYMPHS ABS: 0.8 10*3/uL (ref 0.7–4.0)
LYMPHS PCT: 13 % (ref 12–46)
MCH: 30.3 pg (ref 26.0–34.0)
MCHC: 33.2 g/dL (ref 30.0–36.0)
MCV: 91.3 fL (ref 78.0–100.0)
MONO ABS: 0.6 10*3/uL (ref 0.1–1.0)
MPV: 9.9 fL (ref 8.6–12.4)
Monocytes Relative: 10 % (ref 3–12)
Neutro Abs: 4.5 10*3/uL (ref 1.7–7.7)
Neutrophils Relative %: 76 % (ref 43–77)
PLATELETS: 293 10*3/uL (ref 150–400)
RBC: 3.56 MIL/uL — ABNORMAL LOW (ref 3.87–5.11)
RDW: 13.4 % (ref 11.5–15.5)
WBC: 5.9 10*3/uL (ref 4.0–10.5)

## 2015-03-31 NOTE — Patient Instructions (Signed)
Stop taking 3 Wellbutrin tabs each morning and start taking 2 Wellbutrin tabs.  Please call your psychiatrist for follow up of this problem.

## 2015-03-31 NOTE — Progress Notes (Signed)
03/31/2015 at 1:47 PM  [name] / DOB: [birth year] / MRN: [id number]  The patient has Hypothyroidism; OSA- C-pap intol; Chronic diastolic heart failure; Pulmonary nodules; Homelessness; CAD '07, LAD PCI 2012, SVG-PDA PTCA 11/10/14; Cardiomyopathy-h/o tachycardia mediated-EF 65% per echo March 2016; Hyperlipidemia; Atrial fibrillation with RVR; Hypotension; History of right MCA stroke; Long-term (current) use of anticoagulants; Pulmonary hypertension; COPD (chronic obstructive pulmonary disease); Mood disorder; HTN (hypertension); Anemia, unspecified; Tobacco use disorder; Hypokalemia; CKD (chronic kidney disease), stage III; Demand ischemia secondary to AF with RVR; and Chronic diastolic CHF (congestive heart failure), NYHA class 2 on her problem list.  SUBJECTIVE  Chief complaint: Hallucinations  [name] is a frail 59 y.o. female here today for chief complaint of "halluciations." States that she has been seeing spiders, spider webs, and also seeing numbers "change from 1 to 3" on her pill bottles. She called her son to investigate these spiders number changes. This problem started last 6 days ago and has been somewhat better over the last two days.  She has a long history of intractable depression and her Wellbutrin was just increased from 300 mg qd to 450 mg qd. She denies the current use of illicit drugs and EtOH.  She has not tried anything for her hallucinations.  She called her psychiatrist's office and was told to be evaluated medically. Her son has been helping manage her medications since last Saturday.    She has a history of right sided MCA infarct in 2012 and is managed by neurology. She denies new weakness, word finding difficulty and difficulty speaking, numbness, gait difficulty and any new difficulty with dexterity.     She  has a past medical history of CAD (coronary artery disease); Hypertension; CABG; transesophageal echocardiography (TEE) for monitoring (11/2010); HLD  (hyperlipidemia); Hypothyroidism; COPD (chronic obstructive pulmonary disease); Pulmonary nodules; Acute right MCA stroke (11/07/10); MRSA infection; Eczema; Depression; Atrial fibrillation; Cardiomyopathy with EF 40% at TEE 02/17/14 (likely tachycardia mediated - Myoview 02/19/14 neg for ischemia with normal EF) (02/18/2014); HPV test positive; Sleep apnea; Dysrhythmia (10/2014); GERD (gastroesophageal reflux disease); and Headache.    Medications reviewed and updated by myself where necessary, and exist elsewhere in the encounter.   [name] is allergic to avelox; amoxicillin; hydrocodone; oxycodone; penicillins; and sulfa antibiotics. She  reports that she quit smoking about 6 months ago. Her smoking use included Cigarettes. She has a 7.5 pack-year smoking history. She has never used smokeless tobacco. She reports that she does not drink alcohol or use illicit drugs. She  reports that she does not currently engage in sexual activity. The patient  has past surgical history that includes Coronary artery bypass graft; debriment for infection in chest; Chest wall reconstruction; bilateral knee surgery; Bladder surgery; Left mastoidectomy; Hernia repair; cath 2012; TEE without cardioversion (N/A, 02/17/2014); Cardioversion (N/A, 02/17/2014); and left and right heart catheterization with coronary angiogram (N/A, 11/10/2014).  Her family history includes Alcohol abuse in her father; Anxiety disorder in her sister; Arthritis in her mother; COPD in her mother; Depression in her father, mother, and sister; Diabetes type II in her mother; Drug abuse in her sister; Heart attack in her father; Heart disease in her mother; Hypertension in her father; Osteoporosis in her mother. There is no history of Stroke.  Review of Systems  Constitutional: Negative for fever and chills.  Respiratory: Negative for cough and shortness of breath.   Cardiovascular: Negative for chest pain.  Skin: Negative for itching and rash.    Neurological:  Negative for headaches.    OBJECTIVE  Her  height is  (1.626 m) and weight is 127 lb (57.607 kg). Her temperature is 98.2 F (36.8 C). Her blood pressure is 102/70 and her pulse is 54. Her respiration is 18.  The patient's body mass index is 21.79 kg/(m^2).  Physical Exam  Constitutional: She is oriented to person, place, and time. She appears well-developed.  Cardiovascular: Normal rate, regular rhythm, normal heart sounds and intact distal pulses.  Exam reveals no gallop and no friction rub.   No murmur heard. Respiratory: Effort normal and breath sounds normal. No respiratory distress. She has no wheezes. She has no rales. She exhibits no tenderness.  GI: Soft. Bowel sounds are normal.  Musculoskeletal: Normal range of motion.  Neurological: She is alert and oriented to person, place, and time. She has normal reflexes. She is not disoriented. She displays no atrophy and no tremor. No cranial nerve deficit or sensory deficit. She exhibits normal muscle tone. She displays no seizure activity. Coordination and gait normal. GCS eye subscore is 4. GCS verbal subscore is 5. GCS motor subscore is 6.  Oriented to person, place and time. Recent and remote memory intact.    Skin: Skin is warm and dry.  Psychiatric: Judgment normal. Her mood appears anxious. Her affect is not angry, not blunt, not labile and not inappropriate. Her speech is not rapid and/or pressured, not delayed, not tangential and not slurred. She is not agitated, not aggressive, not hyperactive, not slowed, not withdrawn, not actively hallucinating and not combative. Thought content is not paranoid. Cognition and memory are not impaired. She does not express impulsivity or inappropriate judgment. She exhibits a depressed mood. She expresses no suicidal plans and no homicidal plans. She is communicative. She exhibits normal recent memory and normal remote memory. She is attentive.     ASSESSMENT & PLAN  [name]  was seen today for hallucinations.  Diagnoses and all orders for this visit:  Hallucination, drug-induced: Wellbutrin increased recently by 1/3.  Given the effect of this medication on dopamine metabolism it is reasonable to decrease the dose by 1/3. Her vitals are stable and her neurological exam is intact to challenge.  I have advised that she follow up with her psychiatrist in the next week and will follow the labs below.  Orders: -     CBC with Differential/Platelet -     COMPLETE METABOLIC PANEL WITH GFR -     J47 and folate panel   Frailty Orders: -     TSH   The patient was advised to call or come back to clinic if she does not see an improvement in symptoms, or worsens with the above plan.   Deliah Boston, MHS, PA-C [facility] 03/31/2015 1:47 PM

## 2015-04-01 ENCOUNTER — Encounter (HOSPITAL_COMMUNITY): Payer: Self-pay | Admitting: Emergency Medicine

## 2015-04-01 ENCOUNTER — Telehealth: Payer: Self-pay | Admitting: Physician Assistant

## 2015-04-01 ENCOUNTER — Observation Stay (HOSPITAL_COMMUNITY)
Admission: EM | Admit: 2015-04-01 | Discharge: 2015-04-03 | Disposition: A | Discharge status: Home / Self Care | Payer: Medicare Other | Attending: Internal Medicine | Admitting: Internal Medicine

## 2015-04-01 DIAGNOSIS — R441 Visual hallucinations: Secondary | ICD-10-CM | POA: Diagnosis not present

## 2015-04-01 DIAGNOSIS — Z882 Allergy status to sulfonamides status: Secondary | ICD-10-CM | POA: Insufficient documentation

## 2015-04-01 DIAGNOSIS — I2510 Atherosclerotic heart disease of native coronary artery without angina pectoris: Secondary | ICD-10-CM | POA: Diagnosis not present

## 2015-04-01 DIAGNOSIS — Z7902 Long term (current) use of antithrombotics/antiplatelets: Secondary | ICD-10-CM | POA: Insufficient documentation

## 2015-04-01 DIAGNOSIS — R7889 Finding of other specified substances, not normally found in blood: Secondary | ICD-10-CM | POA: Diagnosis not present

## 2015-04-01 DIAGNOSIS — R634 Abnormal weight loss: Secondary | ICD-10-CM | POA: Diagnosis not present

## 2015-04-01 DIAGNOSIS — I429 Cardiomyopathy, unspecified: Secondary | ICD-10-CM | POA: Diagnosis not present

## 2015-04-01 DIAGNOSIS — R443 Hallucinations, unspecified: Secondary | ICD-10-CM | POA: Diagnosis not present

## 2015-04-01 DIAGNOSIS — R197 Diarrhea, unspecified: Secondary | ICD-10-CM | POA: Insufficient documentation

## 2015-04-01 DIAGNOSIS — R51 Headache: Secondary | ICD-10-CM | POA: Insufficient documentation

## 2015-04-01 DIAGNOSIS — E860 Dehydration: Secondary | ICD-10-CM | POA: Insufficient documentation

## 2015-04-01 DIAGNOSIS — I4891 Unspecified atrial fibrillation: Secondary | ICD-10-CM | POA: Diagnosis not present

## 2015-04-01 DIAGNOSIS — R531 Weakness: Secondary | ICD-10-CM | POA: Diagnosis not present

## 2015-04-01 DIAGNOSIS — Z87891 Personal history of nicotine dependence: Secondary | ICD-10-CM | POA: Insufficient documentation

## 2015-04-01 DIAGNOSIS — E039 Hypothyroidism, unspecified: Secondary | ICD-10-CM | POA: Diagnosis not present

## 2015-04-01 DIAGNOSIS — L309 Dermatitis, unspecified: Secondary | ICD-10-CM | POA: Insufficient documentation

## 2015-04-01 DIAGNOSIS — G4730 Sleep apnea, unspecified: Secondary | ICD-10-CM | POA: Diagnosis not present

## 2015-04-01 DIAGNOSIS — I10 Essential (primary) hypertension: Secondary | ICD-10-CM | POA: Diagnosis not present

## 2015-04-01 DIAGNOSIS — Z79899 Other long term (current) drug therapy: Secondary | ICD-10-CM | POA: Diagnosis not present

## 2015-04-01 DIAGNOSIS — Z8673 Personal history of transient ischemic attack (TIA), and cerebral infarction without residual deficits: Secondary | ICD-10-CM | POA: Insufficient documentation

## 2015-04-01 DIAGNOSIS — K219 Gastro-esophageal reflux disease without esophagitis: Secondary | ICD-10-CM | POA: Insufficient documentation

## 2015-04-01 DIAGNOSIS — J449 Chronic obstructive pulmonary disease, unspecified: Secondary | ICD-10-CM | POA: Insufficient documentation

## 2015-04-01 DIAGNOSIS — F329 Major depressive disorder, single episode, unspecified: Secondary | ICD-10-CM | POA: Diagnosis not present

## 2015-04-01 DIAGNOSIS — Z8614 Personal history of Methicillin resistant Staphylococcus aureus infection: Secondary | ICD-10-CM | POA: Diagnosis not present

## 2015-04-01 DIAGNOSIS — Z885 Allergy status to narcotic agent status: Secondary | ICD-10-CM | POA: Insufficient documentation

## 2015-04-01 DIAGNOSIS — E876 Hypokalemia: Secondary | ICD-10-CM | POA: Diagnosis not present

## 2015-04-01 DIAGNOSIS — I5032 Chronic diastolic (congestive) heart failure: Secondary | ICD-10-CM | POA: Diagnosis present

## 2015-04-01 DIAGNOSIS — Z880 Allergy status to penicillin: Secondary | ICD-10-CM | POA: Insufficient documentation

## 2015-04-01 DIAGNOSIS — E785 Hyperlipidemia, unspecified: Secondary | ICD-10-CM | POA: Diagnosis not present

## 2015-04-01 DIAGNOSIS — Z951 Presence of aortocoronary bypass graft: Secondary | ICD-10-CM | POA: Diagnosis not present

## 2015-04-01 DIAGNOSIS — E43 Unspecified severe protein-calorie malnutrition: Secondary | ICD-10-CM | POA: Diagnosis not present

## 2015-04-01 DIAGNOSIS — I509 Heart failure, unspecified: Secondary | ICD-10-CM | POA: Insufficient documentation

## 2015-04-01 LAB — CBC WITH DIFFERENTIAL/PLATELET
BASOS ABS: 0 10*3/uL (ref 0.0–0.1)
BASOS PCT: 0 % (ref 0–1)
Eosinophils Absolute: 0.1 10*3/uL (ref 0.0–0.7)
Eosinophils Relative: 2 % (ref 0–5)
HCT: 29.3 % — ABNORMAL LOW (ref 36.0–46.0)
Hemoglobin: 10.3 g/dL — ABNORMAL LOW (ref 12.0–15.0)
Lymphocytes Relative: 13 % (ref 12–46)
Lymphs Abs: 0.7 10*3/uL (ref 0.7–4.0)
MCH: 31.2 pg (ref 26.0–34.0)
MCHC: 35.2 g/dL (ref 30.0–36.0)
MCV: 88.8 fL (ref 78.0–100.0)
Monocytes Absolute: 0.6 10*3/uL (ref 0.1–1.0)
Monocytes Relative: 12 % (ref 3–12)
NEUTROS PCT: 73 % (ref 43–77)
Neutro Abs: 3.9 10*3/uL (ref 1.7–7.7)
PLATELETS: 248 10*3/uL (ref 150–400)
RBC: 3.3 MIL/uL — ABNORMAL LOW (ref 3.87–5.11)
RDW: 12.5 % (ref 11.5–15.5)
WBC: 5.3 10*3/uL (ref 4.0–10.5)

## 2015-04-01 LAB — URINALYSIS, ROUTINE W REFLEX MICROSCOPIC
BILIRUBIN URINE: NEGATIVE
Glucose, UA: NEGATIVE mg/dL
Hgb urine dipstick: NEGATIVE
KETONES UR: NEGATIVE mg/dL
Leukocytes, UA: NEGATIVE
Nitrite: NEGATIVE
PROTEIN: NEGATIVE mg/dL
SPECIFIC GRAVITY, URINE: 1.01 (ref 1.005–1.030)
UROBILINOGEN UA: 1 mg/dL (ref 0.0–1.0)
pH: 6.5 (ref 5.0–8.0)

## 2015-04-01 LAB — TSH: TSH: 0.015 u[IU]/mL — AB (ref 0.350–4.500)

## 2015-04-01 LAB — COMPREHENSIVE METABOLIC PANEL
ALBUMIN: 3 g/dL — AB (ref 3.5–5.0)
ALT: 16 U/L (ref 14–54)
ANION GAP: 8 (ref 5–15)
AST: 17 U/L (ref 15–41)
Alkaline Phosphatase: 65 U/L (ref 38–126)
BILIRUBIN TOTAL: 0.4 mg/dL (ref 0.3–1.2)
BUN: 20 mg/dL (ref 6–20)
CHLORIDE: 109 mmol/L (ref 101–111)
CO2: 23 mmol/L (ref 22–32)
CREATININE: 1.26 mg/dL — AB (ref 0.44–1.00)
Calcium: 8.2 mg/dL — ABNORMAL LOW (ref 8.9–10.3)
GFR calc non Af Amer: 46 mL/min — ABNORMAL LOW (ref >60)
GFR, EST AFRICAN AMERICAN: 54 mL/min — AB (ref >60)
Glucose, Bld: 96 mg/dL (ref 65–99)
Potassium: 2.5 mmol/L — CL (ref 3.5–5.1)
Sodium: 140 mmol/L (ref 135–145)
Total Protein: 6.5 g/dL (ref 6.5–8.1)

## 2015-04-01 LAB — CBC
HCT: 28.7 % — ABNORMAL LOW (ref 36.0–46.0)
HEMOGLOBIN: 9.8 g/dL — AB (ref 12.0–15.0)
MCH: 30.1 pg (ref 26.0–34.0)
MCHC: 34.1 g/dL (ref 30.0–36.0)
MCV: 88 fL (ref 78.0–100.0)
Platelets: 211 10*3/uL (ref 150–400)
RBC: 3.26 MIL/uL — ABNORMAL LOW (ref 3.87–5.11)
RDW: 12.5 % (ref 11.5–15.5)
WBC: 4.7 10*3/uL (ref 4.0–10.5)

## 2015-04-01 LAB — RAPID URINE DRUG SCREEN, HOSP PERFORMED
Amphetamines: NOT DETECTED
BENZODIAZEPINES: NOT DETECTED
Barbiturates: NOT DETECTED
COCAINE: NOT DETECTED
Opiates: NOT DETECTED
Tetrahydrocannabinol: NOT DETECTED

## 2015-04-01 LAB — CORTISOL: Cortisol, Plasma: 14 ug/dL

## 2015-04-01 LAB — CREATININE, SERUM
Creatinine, Ser: 1.1 mg/dL — ABNORMAL HIGH (ref 0.44–1.00)
GFR calc Af Amer: >60 mL/min (ref >60)
GFR, EST NON AFRICAN AMERICAN: 55 mL/min — AB (ref >60)

## 2015-04-01 LAB — T4, FREE: Free T4: 2.54 ng/dL — ABNORMAL HIGH (ref 0.61–1.12)

## 2015-04-01 LAB — MAGNESIUM: MAGNESIUM: 1 mg/dL — AB (ref 1.7–2.4)

## 2015-04-01 MED ORDER — HEPARIN SODIUM (PORCINE) 5000 UNIT/ML IJ SOLN: 5000.0000 [IU] | Freq: Three times a day (TID) | INTRAMUSCULAR | Status: DC

## 2015-04-01 MED ORDER — POTASSIUM CHLORIDE CRYS ER 20 MEQ PO TBCR
40.0000 meq | EXTENDED_RELEASE_TABLET | Freq: Once | ORAL | Status: AC
  Administered 2015-04-01: 40 meq via ORAL
  Filled 2015-04-01: qty 2

## 2015-04-01 MED ORDER — AMIODARONE HCL 200 MG PO TABS
200.0000 mg | ORAL_TABLET | Freq: Every day | ORAL | Status: DC
  Administered 2015-04-01 – 2015-04-03 (×3): 200 mg via ORAL
  Filled 2015-04-01 (×3): qty 1

## 2015-04-01 MED ORDER — RIVAROXABAN 20 MG PO TABS
20.0000 mg | ORAL_TABLET | Freq: Every day | ORAL | Status: DC
  Administered 2015-04-01 – 2015-04-02 (×2): 20 mg via ORAL
  Filled 2015-04-01 (×3): qty 1

## 2015-04-01 MED ORDER — POTASSIUM CHLORIDE 10 MEQ/100ML IV SOLN
10.0000 meq | INTRAVENOUS | Status: AC
  Administered 2015-04-01 (×2): 10 meq via INTRAVENOUS
  Filled 2015-04-01 (×2): qty 100

## 2015-04-01 MED ORDER — ENSURE ENLIVE PO LIQD
237.0000 mL | Freq: Two times a day (BID) | ORAL | Status: DC
  Administered 2015-04-01 – 2015-04-03 (×2): 237 mL via ORAL

## 2015-04-01 MED ORDER — ATORVASTATIN CALCIUM 20 MG PO TABS
20.0000 mg | ORAL_TABLET | Freq: Every day | ORAL | Status: DC
  Administered 2015-04-01 – 2015-04-03 (×3): 20 mg via ORAL
  Filled 2015-04-01 (×3): qty 1

## 2015-04-01 MED ORDER — SODIUM CHLORIDE 0.9 % IJ SOLN
3.0000 mL | Freq: Two times a day (BID) | INTRAMUSCULAR | Status: DC
  Administered 2015-04-02: 3 mL via INTRAVENOUS

## 2015-04-01 MED ORDER — ACETAMINOPHEN 650 MG RE SUPP: 650.0000 mg | Freq: Four times a day (QID) | RECTAL | Status: DC | PRN

## 2015-04-01 MED ORDER — ONDANSETRON HCL 4 MG/2ML IJ SOLN: 4.0000 mg | Freq: Four times a day (QID) | INTRAMUSCULAR | Status: DC | PRN

## 2015-04-01 MED ORDER — BUPROPION HCL ER (XL) 300 MG PO TB24
300.0000 mg | ORAL_TABLET | Freq: Every morning | ORAL | Status: DC
  Administered 2015-04-02 – 2015-04-03 (×2): 300 mg via ORAL
  Filled 2015-04-01 (×2): qty 1

## 2015-04-01 MED ORDER — MAGNESIUM GLUCONATE 500 MG PO TABS
500.0000 mg | ORAL_TABLET | Freq: Every day | ORAL | Status: DC
  Administered 2015-04-01: 500 mg via ORAL
  Filled 2015-04-01: qty 1

## 2015-04-01 MED ORDER — SODIUM CHLORIDE 0.9 % IV SOLN
INTRAVENOUS | Status: AC
  Administered 2015-04-01 – 2015-04-02 (×2): via INTRAVENOUS

## 2015-04-01 MED ORDER — ACETAMINOPHEN 325 MG PO TABS
650.0000 mg | ORAL_TABLET | Freq: Four times a day (QID) | ORAL | Status: DC | PRN
  Administered 2015-04-01: 650 mg via ORAL
  Filled 2015-04-01: qty 2

## 2015-04-01 MED ORDER — SODIUM CHLORIDE 0.9 % IV BOLUS (SEPSIS)
1000.0000 mL | Freq: Once | INTRAVENOUS | Status: AC
  Administered 2015-04-01: 1000 mL via INTRAVENOUS

## 2015-04-01 MED ORDER — LEVOTHYROXINE SODIUM 125 MCG PO TABS
125.0000 ug | ORAL_TABLET | Freq: Every day | ORAL | Status: DC
  Filled 2015-04-01: qty 1

## 2015-04-01 MED ORDER — TOPIRAMATE 25 MG PO TABS
50.0000 mg | ORAL_TABLET | Freq: Two times a day (BID) | ORAL | Status: DC
  Administered 2015-04-01 – 2015-04-03 (×4): 50 mg via ORAL
  Filled 2015-04-01 (×4): qty 2

## 2015-04-01 MED ORDER — TAMSULOSIN HCL 0.4 MG PO CAPS
0.4000 mg | ORAL_CAPSULE | Freq: Every day | ORAL | Status: DC
  Administered 2015-04-02 – 2015-04-03 (×2): 0.4 mg via ORAL
  Filled 2015-04-01 (×2): qty 1

## 2015-04-01 MED ORDER — ONDANSETRON HCL 4 MG PO TABS: 4.0000 mg | ORAL_TABLET | Freq: Four times a day (QID) | ORAL | Status: DC | PRN

## 2015-04-01 MED ORDER — CLOPIDOGREL BISULFATE 75 MG PO TABS
75.0000 mg | ORAL_TABLET | Freq: Every day | ORAL | Status: DC
  Administered 2015-04-02 – 2015-04-03 (×2): 75 mg via ORAL
  Filled 2015-04-01 (×2): qty 1

## 2015-04-01 NOTE — ED Notes (Signed)
Pt sts that she usually has to be catherized to get urine. Giving pt a little more time to see if she can provide urine on her own

## 2015-04-01 NOTE — ED Notes (Signed)
Pt reports that she has "soiled herself." Pt was provided with body wash, clean linen and new underwear. After she cleans herself, I&O will be completed.

## 2015-04-01 NOTE — ED Provider Notes (Signed)
CSN: [id number]     Arrival date & time 04/01/15  1771 History   First MD Initiated Contact with Patient 04/01/15 [phone]     Chief Complaint  Patient presents with  . Abnormal Lab     (Consider location/radiation/quality/duration/timing/severity/associated sxs/prior Treatment) HPI [name] is a 59 y.o. female with history of coronary disease, hypertension, hypothyroidism, COPD, A. fib, CHF, presents to emergency department for abnormal labs. Patient states she was seen yesterday by an urgent care after she was having hallucinations. States  "dosage kept changing on my medication bottle and i saw spiders when there werent any there." pt had blood work performed by them and today was told to go to ED for potassium of 2.8, hgb 10.8, ca of 8.3 and low TSH. Pt denies any changes in her medications. Patient admits to generalized weakness, states she is not eating because she does not have money.  Past Medical History  Diagnosis Date  . CAD (coronary artery disease)     a.  cath 09/2010: LAD stent patent, S-Int/dCFX ok, S-PDA ok, L-LAD atretic;  b. Lexiscan Myoview (02/2014):  no ischemia, EF 55%; c. 11/2014 Cath/PCI: LM nl, LAD 20pISR, LCX 80-42m, OM1 nl, RI 70p, RCA 40-75m, RPDA 95ost/95-66m (PTCA only w/ reduction to 50p/6m), 60d, L->LAD atretic, VG->RI->OM nl, VG->PDA 100p.  [name] Hypertension   . Hx of CABG   . Hx of transesophageal echocardiography (TEE) for monitoring 11/2010    TEE 11/2010: EF 60-65%, BAE, trivial atrial septal shunt;  right heart cath in 10/2010 with elevated R and L heart pressures and diuretic started  . HLD (hyperlipidemia)   . Hypothyroidism   . COPD (chronic obstructive pulmonary disease)   . Pulmonary nodules     repeat CT due in 11/2011  . Acute right MCA stroke 11/07/10  . 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 Eczema   . Depression   . Atrial fibrillation     a. s/p TEE-DCCV 02/2104; b. Xarelto started  . Cardiomyopathy with EF 40% at TEE 02/17/14  (likely tachycardia mediated - Myoview 02/19/14 neg for ischemia with normal EF) 02/18/2014  . 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 Sleep apnea     recent sleep study  in 04/2014 per chart review  shows no significant OSA  . Dysrhythmia 10/2014    atrial fib with rvr  . GERD (gastroesophageal reflux disease)   . Headache    Past Surgical History  Procedure Laterality Date  . Coronary artery bypass graft    . Debriment for infection in chest    . Chest wall reconstruction    . Bilateral knee surgery    . Bladder surgery    . Left mastoidectomy    . Hernia repair    . Cath 2012    . Tee without cardioversion N/A 02/17/2014    Procedure: TRANSESOPHAGEAL ECHOCARDIOGRAM (TEE);  Surgeon: Pricilla Riffle, MD;  Location: [facility] ENDOSCOPY;  Service: Cardiovascular;  Laterality: N/A;  . Cardioversion N/A 02/17/2014    Procedure: CARDIOVERSION;  Surgeon: Pricilla Riffle, MD;  Location: [facility] ENDOSCOPY;  Service: Cardiovascular;  Laterality: N/A;  . Left and right heart catheterization with coronary angiogram N/A 11/10/2014    Procedure: LEFT AND RIGHT HEART CATHETERIZATION WITH CORONARY ANGIOGRAM;  Surgeon: Marykay Lex, MD;  Location: [facility] CATH LAB;  Service: Cardiovascular;  Laterality: N/A;   Family History  Problem  Relation Age of Onset  . COPD Mother   . Heart disease Mother   . Arthritis Mother     Rheumatoid and PMR  . Osteoporosis Mother     Mom fractured hip  . Diabetes type II Mother   . Depression Mother   . Heart attack Father   . Depression Father   . Hypertension Father   . Alcohol abuse Father   . Depression Sister   . Anxiety disorder Sister   . Drug abuse Sister   . Stroke Neg Hx    History  Substance Use Topics  . Smoking status: Former Smoker -- 0.25 packs/day for 30 years    Types: Cigarettes    Quit date: 09/10/2014  . Smokeless tobacco: Never Used  . Alcohol Use: No   OB History    No data available     Review of Systems   Constitutional: Positive for fatigue. Negative for fever and chills.  Respiratory: Negative for cough, chest tightness and shortness of breath.   Cardiovascular: Negative for chest pain, palpitations and leg swelling.  Gastrointestinal: Negative for nausea, vomiting, abdominal pain and diarrhea.  Genitourinary: Negative for dysuria, flank pain, vaginal bleeding, vaginal discharge, vaginal pain and pelvic pain.  Musculoskeletal: Negative for myalgias, arthralgias, neck pain and neck stiffness.  Skin: Negative for rash.  Neurological: Positive for weakness. Negative for dizziness and headaches.  Psychiatric/Behavioral: Positive for hallucinations.  All other systems reviewed and are negative.     Allergies  Avelox; Amoxicillin; Hydrocodone; Oxycodone; Penicillins; and Sulfa antibiotics  Home Medications   Prior to Admission medications   Medication Sig Start Date End Date Taking? Authorizing Provider  albuterol (PROVENTIL HFA;VENTOLIN HFA) 108 (90 BASE) MCG/ACT inhaler Inhale 2 puffs into the lungs every 6 (six) hours as needed for wheezing or shortness of breath. 03/06/14   Esperanza Sheets, MD  amiodarone (PACERONE) 200 MG tablet Take 1 tablet (200 mg total) by mouth daily. 02/16/15   Lars Masson, MD  atorvastatin (LIPITOR) 20 MG tablet Take 1 tablet (20 mg total) by mouth daily. 03/24/15   Lars Masson, MD  buPROPion (WELLBUTRIN XL) 150 MG 24 hr tablet 3 qam 02/22/15   Archer Asa, MD  clopidogrel (PLAVIX) 75 MG tablet Take 1 tablet (75 mg total) by mouth daily with breakfast. 11/11/14   Ok Anis, NP  levothyroxine (SYNTHROID, LEVOTHROID) 125 MCG tablet Take 1 tablet (125 mcg total) by mouth daily. 12/22/14   Madelin Headings, MD  nitroGLYCERIN (NITROSTAT) 0.4 MG SL tablet Place 1 tablet (0.4 mg total) under the tongue every 5 (five) minutes as needed for chest pain. 01/24/15   Lars Masson, MD  ondansetron (ZOFRAN-ODT) 4 MG disintegrating tablet DISSOLVE 1 TABLET ON  THE TONGUE EVERY 8 HOURS AS NEEDED FOR NAUSEA 12/16/14   Madelin Headings, MD  potassium chloride SA (K-DUR,KLOR-CON) 20 MEQ tablet Take 1 tablet (20 mEq total) by mouth 3 (three) times daily. 01/26/15   Beatrice Lecher, PA-C  rivaroxaban (XARELTO) 20 MG TABS tablet Take 1 tablet (20 mg total) by mouth daily with supper. 01/25/15   Lars Masson, MD  tamsulosin (FLOMAX) 0.4 MG CAPS capsule Take 1 capsule (0.4 mg total) by mouth daily. 12/14/14   Rodolph Bong, MD  topiramate (TOPAMAX) 50 MG tablet Take 1 tablet (50 mg total) by mouth 2 (two) times daily. 02/03/15   Micki Riley, MD  traMADol (ULTRAM) 50 MG tablet TAKE 2 TABLETS BY MOUTH EVERY 6  HOURS AS NEEDED FOR MODERATE PAIN 12/14/14   Rodolph Bong, MD   There were no vitals taken for this visit. Physical Exam  Constitutional: She is oriented to person, place, and time. She appears well-developed and well-nourished. No distress.  HENT:  Head: Normocephalic.  Eyes: Conjunctivae are normal.  Neck: Neck supple.  Cardiovascular: Normal rate, regular rhythm and normal heart sounds.   Pulmonary/Chest: Effort normal and breath sounds normal. No respiratory distress. She has no wheezes. She has no rales.  Abdominal: Soft. Bowel sounds are normal. She exhibits no distension. There is no tenderness. There is no rebound.  Musculoskeletal: She exhibits no edema.  Neurological: She is alert and oriented to person, place, and time.  Skin: Skin is warm and dry.  Psychiatric: She has a normal mood and affect. Her behavior is normal.  Nursing note and vitals reviewed.   ED Course  Procedures (including critical care time) Labs Review Labs Reviewed  CBC WITH DIFFERENTIAL/PLATELET - Abnormal; Notable for the following:    RBC 3.30 (*)    Hemoglobin 10.3 (*)    HCT 29.3 (*)    All other components within normal limits  COMPREHENSIVE METABOLIC PANEL - Abnormal; Notable for the following:    Potassium 2.5 (*)    Creatinine, Ser 1.26 (*)     Calcium 8.2 (*)    Albumin 3.0 (*)    GFR calc non Af Amer 46 (*)    GFR calc Af Amer 54 (*)    All other components within normal limits  MAGNESIUM - Abnormal; Notable for the following:    Magnesium 1.0 (*)    All other components within normal limits  URINALYSIS, ROUTINE W REFLEX MICROSCOPIC (NOT AT [facility])  URINE RAPID DRUG SCREEN, HOSP PERFORMED    Imaging Review No results found.   EKG Interpretation None      MDM   Final diagnoses:  Hypokalemia  Hypocalcemia  Dehydration  Weakness  Weight loss  Hallucination   Patient with generalized weakness, hallucinations, not eating or drinking, appears to be dehydrated. Will get labs, yesterday's potassium 2.8, also TSH 0.013. IV fluids started. We'll start potassium. EKG obtained which shows sinus rhythm, with PVC, prolonged QT interval which is unchanged.  3:45 PM Potassium here 2.5, magnesium 1. Creatinine 1.26. Patient is hyper thyroid. She is on levothyroxine. Maybe a medications need adjustment. Will call for admission for electrolyte repletion and monitor.   Spoke with triad, will admit.   Filed Vitals:   04/01/15 0959 04/01/15 1202 04/01/15 1531  BP: 117/93 141/92 142/86  Pulse: 54 56 50  Temp: 97.5 F (36.4 C) 97.8 F (36.6 C) 98 F (36.7 C)  TempSrc: Oral Oral Oral  Resp: 16 19 17   SpO2: 100% 99% 100%      Jaynie Crumble, PA-C 04/01/15 1546  Rolan Bucco, MD 04/01/15 [phone]

## 2015-04-01 NOTE — ED Notes (Signed)
Pt from home via EMS c/o abnormal labs. Pt sts that she received call that her K+ was "3 something"? Pt has no c/o and denies pain. Pt ambulated from ambulance to room w/o difficulty. Pt is A&O and in NAD

## 2015-04-01 NOTE — Telephone Encounter (Signed)
Spoke with patient regarding several abnormal labs.  Given the precipitous drop in her potassium I have recommended that she go to the ED for further eval and possible admission.  In addition to her suddenly worsened hypokalemia, she has a new normocytic anemia, a mild hypocalcemia, and a new hyperthyroidism.  A single etiology for these abrupt changes is unclear at this point and she is frail with multiple comorbid problems. Patient agreeable to plan and reports she will go to [facility], either by ambulance or will get her son to take her.  Called [facility] ED and spoke with charge nurse and and briefly shared my concerns and patient's demographic information. Deliah Boston, MS, PA-C   8:03 AM, 04/01/2015

## 2015-04-01 NOTE — ED Notes (Signed)
Main lab reports that they never received the add on label and they will run it now. PA notified of delay

## 2015-04-01 NOTE — ED Notes (Signed)
Pt reports that she wishes to have a I&O catheter because she cannot urinate on her own. NT to perform catherization

## 2015-04-01 NOTE — H&P (Addendum)
[facility] History and Physical  [name]:[id number] DOB: 1956-06-25 DOA: 04/01/2015  Referring physician: Emergency Department PCP: [name], MD  Specialists:   Chief Complaint: Electrolyte abnormality  HPI: [name] is a 59 y.o. female  CAD, HTN, HLD , COPD, hypothyroid, systolic CHF who presented with recent hallucinations, seen at Urgent Care clinic later found to have low K of 2.8 and TSH of 0.015. Pt was referred to the ED where Mg was noted to be 1.0. Pt started on potassium replacement. Hospitalist consulted for admission. On further questioning, pt admits to not eating well over the past two months secondary to financial strains.  Review of Systems Review of Systems  Constitutional: Negative for fever and chills.  HENT: Negative for ear discharge and ear pain.   Eyes: Negative for pain and discharge.  Respiratory: Negative for sputum production and wheezing.   Cardiovascular: Negative for orthopnea and claudication.  Gastrointestinal: Negative for nausea and vomiting.  Genitourinary: Negative for hematuria.  Musculoskeletal: Negative for back pain, falls and neck pain.  Neurological: Negative for tremors, seizures and loss of consciousness.  Psychiatric/Behavioral: Negative for hallucinations, memory loss and substance abuse. The patient is not nervous/anxious.      Past Medical History  Diagnosis Date  . CAD (coronary artery disease)     a.  cath 09/2010: LAD stent patent, S-Int/dCFX ok, S-PDA ok, L-LAD atretic;  b. Lexiscan Myoview (02/2014):  no ischemia, EF 55%; c. 11/2014 Cath/PCI: LM nl, LAD 20pISR, LCX 80-18m, OM1 nl, RI 70p, RCA 40-3m, RPDA 95ost/95-27m (PTCA only w/ reduction to 50p/44m), 60d, L->LAD atretic, VG->RI->OM nl, VG->PDA 100p.  [name] Hypertension   . Hx of CABG   . Hx of transesophageal echocardiography (TEE) for monitoring 11/2010    TEE 11/2010: EF 60-65%, BAE, trivial atrial septal shunt;  right heart cath in 10/2010 with  elevated R and L heart pressures and diuretic started  . HLD (hyperlipidemia)   . Hypothyroidism   . COPD (chronic obstructive pulmonary disease)   . Pulmonary nodules     repeat CT due in 11/2011  . Acute right MCA stroke 11/07/10  . 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 Eczema   . Depression   . Atrial fibrillation     a. s/p TEE-DCCV 02/2104; b. Xarelto started  . Cardiomyopathy with EF 40% at TEE 02/17/14 (likely tachycardia mediated - Myoview 02/19/14 neg for ischemia with normal EF) 02/18/2014  . 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 Sleep apnea     recent sleep study  in 04/2014 per chart review  shows no significant OSA  . Dysrhythmia 10/2014    atrial fib with rvr  . GERD (gastroesophageal reflux disease)   . Headache    Past Surgical History  Procedure Laterality Date  . Coronary artery bypass graft    . Debriment for infection in chest    . Chest wall reconstruction    . Bilateral knee surgery    . Bladder surgery    . Left mastoidectomy    . Hernia repair    . Cath 2012    . Tee without cardioversion N/A 02/17/2014    Procedure: TRANSESOPHAGEAL ECHOCARDIOGRAM (TEE);  Surgeon: Pricilla Riffle, MD;  Location: [facility] ENDOSCOPY;  Service: Cardiovascular;  Laterality: N/A;  . Cardioversion N/A 02/17/2014    Procedure: CARDIOVERSION;  Surgeon: Pricilla Riffle, MD;  Location: [facility]  ENDOSCOPY;  Service: Cardiovascular;  Laterality: N/A;  . Left and right heart catheterization with coronary angiogram N/A 11/10/2014    Procedure: LEFT AND RIGHT HEART CATHETERIZATION WITH CORONARY ANGIOGRAM;  Surgeon: Marykay Lex, MD;  Location: [facility] CATH LAB;  Service: Cardiovascular;  Laterality: N/A;   Social History:  reports that she quit smoking about 6 months ago. Her smoking use included Cigarettes. She has a 7.5 pack-year smoking history. She has never used smokeless tobacco. She reports that she does not drink alcohol or use illicit drugs.  where does  patient live--home, ALF, SNF? and with whom if at home?  Can patient participate in ADLs?  Allergies  Allergen Reactions  . Avelox [Moxifloxacin Hcl In Nacl] Other (See Comments)    Mental breakdown.  [name] Amoxicillin Hives  . Hydrocodone Itching  . Oxycodone Itching  . Penicillins Hives  . Sulfa Antibiotics Other (See Comments)    unknown    Family History  Problem Relation Age of Onset  . COPD Mother   . Heart disease Mother   . Arthritis Mother     Rheumatoid and PMR  . Osteoporosis Mother     Mom fractured hip  . Diabetes type II Mother   . Depression Mother   . Heart attack Father   . Depression Father   . Hypertension Father   . Alcohol abuse Father   . Depression Sister   . Anxiety disorder Sister   . Drug abuse Sister   . Stroke Neg Hx     (be sure to complete)  Prior to Admission medications   Medication Sig Start Date End Date Taking? Authorizing Provider  amiodarone (PACERONE) 200 MG tablet Take 1 tablet (200 mg total) by mouth daily. 02/16/15  Yes Lars Masson, MD  atorvastatin (LIPITOR) 20 MG tablet Take 1 tablet (20 mg total) by mouth daily. 03/24/15  Yes Lars Masson, MD  buPROPion (WELLBUTRIN XL) 150 MG 24 hr tablet 3 qam Patient taking differently: Take 300 mg by mouth every morning.  02/22/15  Yes Archer Asa, MD  clopidogrel (PLAVIX) 75 MG tablet Take 1 tablet (75 mg total) by mouth daily with breakfast. 11/11/14  Yes Ok Anis, NP  levothyroxine (SYNTHROID, LEVOTHROID) 125 MCG tablet Take 1 tablet (125 mcg total) by mouth daily. 12/22/14  Yes Madelin Headings, MD  rivaroxaban (XARELTO) 20 MG TABS tablet Take 1 tablet (20 mg total) by mouth daily with supper. 01/25/15  Yes Lars Masson, MD  tamsulosin (FLOMAX) 0.4 MG CAPS capsule Take 1 capsule (0.4 mg total) by mouth daily. 12/14/14  Yes Rodolph Bong, MD  topiramate (TOPAMAX) 50 MG tablet Take 1 tablet (50 mg total) by mouth 2 (two) times daily. 02/03/15  Yes Micki Riley, MD   traMADol (ULTRAM) 50 MG tablet TAKE 2 TABLETS BY MOUTH EVERY 6 HOURS AS NEEDED FOR MODERATE PAIN 12/14/14  Yes Rodolph Bong, MD  vitamin B-12 (CYANOCOBALAMIN) 1000 MCG tablet Take 1,000 mcg by mouth daily.   Yes Historical Provider, MD  nitroGLYCERIN (NITROSTAT) 0.4 MG SL tablet Place 1 tablet (0.4 mg total) under the tongue every 5 (five) minutes as needed for chest pain. 01/24/15   Lars Masson, MD  ondansetron (ZOFRAN-ODT) 4 MG disintegrating tablet DISSOLVE 1 TABLET ON THE TONGUE EVERY 8 HOURS AS NEEDED FOR NAUSEA 12/16/14   Madelin Headings, MD  potassium chloride SA (K-DUR,KLOR-CON) 20 MEQ tablet Take 1 tablet (20 mEq total) by mouth 3 (three) times daily.  01/26/15   Beatrice Lecher, PA-C   Physical Exam: Filed Vitals:   04/01/15 0959 04/01/15 1202 04/01/15 1531  BP: 117/93 141/92 142/86  Pulse: 54 56 50  Temp: 97.5 F (36.4 C) 97.8 F (36.6 C) 98 F (36.7 C)  TempSrc: Oral Oral Oral  Resp: SpO2: 100% 99% 100%     General:  Awake, in nad  Eyes: PERRL B  ENT: membranes moist, dentition fair  Neck: trachea midline, neck supple   Cardiovascular: regular, s1,s2  Respiratory: normal resp effort, no wheezing  Abdomen: soft,nondistended  Skin: normal skin turgor, no abnormal skin lesions seen  Musculoskeletal: perfused, no clubbing  Psychiatric: mood/affect normal// no auditory/visual hallucinations  Neurologic: cn2-12 grossly intact, strength/sensation intact  Labs on Admission:  Basic Metabolic Panel:  Recent Labs Lab 03/31/15 1306 04/01/15 1005  NA 140 140  K 2.8* 2.5*  CL 104 109  CO2 23 23  GLUCOSE 82 96  BUN 19 20  CREATININE 1.07 1.26*  CALCIUM 8.3* 8.2*  MG  --  1.0*   Liver Function Tests:  Recent Labs Lab 03/31/15 1306 04/01/15 1005  AST 13 17  ALT 10 16  ALKPHOS 69 65  BILITOT 0.5 0.4  PROT 6.3 6.5  ALBUMIN 3.5 3.0*   No results for input(s): LIPASE, AMYLASE in the last 168 hours. No results for input(s): AMMONIA in the  last 168 hours. CBC:  Recent Labs Lab 03/31/15 1306 04/01/15 1005  WBC 5.9 5.3  NEUTROABS 4.5 3.9  HGB 10.8* 10.3*  HCT 32.5* 29.3*  MCV 91.3 88.8  PLT 293 248   Cardiac Enzymes: No results for input(s): CKTOTAL, CKMB, CKMBINDEX, TROPONINI in the last 168 hours.  BNP (last 3 results)  Recent Labs  10/11/14 0954 11/17/14 2107  BNP 213.0* 69.0    ProBNP (last 3 results)  Recent Labs  11/08/14 1247  PROBNP 89.0    CBG: No results for input(s): GLUCAP in the last 168 hours.  Radiological Exams on Admission: No results found.   Assessment/Plan Active Problems:   Hypothyroidism   Chronic diastolic heart failure   CAD '07, LAD PCI 2012, SVG-PDA PTCA 11/10/14   Cardiomyopathy-h/o tachycardia mediated-EF 65% per echo March 2016   Hyperlipidemia   Hypokalemia   Chronic diastolic CHF (congestive heart failure), NYHA class 2   Hypomagnesemia   1. Hypokalemia 1. Suspect secondary to malnutrition 2. Replace 3. Also replace Mg 4. Hemodynamically stable 5. Admit med-tele obs 2. Hypomagnesemia 1. Low at 1.0 2. Will replace 3. Check tox screen including etoh 3. Hypothyroid 1. Will check free t4 4. Hx diastolic chf 1. Clinically mildly dehydrated 2. Cont on gentle IVF as tolerated 5. HLD 1. Stable 2. On statin 6. HTN 1. BP stable 2. Cont monitor 7. DVT prophylaixs 1. On xarelto   Code Status: Full (must indicate code status--if unknown or must be presumed, indicate so) Family Communication: Pt in room (indicate person spoken with, if applicable, with phone number if by telephone) Disposition Plan: admit med-tele (indicate anticipated LOS)   CHIU, STEPHEN K [facility] Pager [phone]  If 7PM-7AM, please contact night-coverage [email or web address] Password [facility] 04/01/2015, 3:38 PM

## 2015-04-01 NOTE — ED Notes (Signed)
Patient unable to void at this time

## 2015-04-02 DIAGNOSIS — E876 Hypokalemia: Secondary | ICD-10-CM | POA: Diagnosis not present

## 2015-04-02 DIAGNOSIS — I5032 Chronic diastolic (congestive) heart failure: Secondary | ICD-10-CM | POA: Diagnosis not present

## 2015-04-02 DIAGNOSIS — E785 Hyperlipidemia, unspecified: Secondary | ICD-10-CM | POA: Diagnosis not present

## 2015-04-02 LAB — COMPREHENSIVE METABOLIC PANEL
ALK PHOS: 62 U/L (ref 38–126)
ALT: 16 U/L (ref 14–54)
AST: 17 U/L (ref 15–41)
Albumin: 2.8 g/dL — ABNORMAL LOW (ref 3.5–5.0)
Anion gap: 7 (ref 5–15)
BILIRUBIN TOTAL: 0.3 mg/dL (ref 0.3–1.2)
BUN: 13 mg/dL (ref 6–20)
CHLORIDE: 113 mmol/L — AB (ref 101–111)
CO2: 20 mmol/L — AB (ref 22–32)
Calcium: 7.8 mg/dL — ABNORMAL LOW (ref 8.9–10.3)
Creatinine, Ser: 1.01 mg/dL — ABNORMAL HIGH (ref 0.44–1.00)
GFR calc non Af Amer: >60 mL/min (ref >60)
GLUCOSE: 92 mg/dL (ref 65–99)
POTASSIUM: 2.9 mmol/L — AB (ref 3.5–5.1)
SODIUM: 140 mmol/L (ref 135–145)
Total Protein: 5.9 g/dL — ABNORMAL LOW (ref 6.5–8.1)

## 2015-04-02 LAB — CBC
HCT: 28 % — ABNORMAL LOW (ref 36.0–46.0)
Hemoglobin: 9.4 g/dL — ABNORMAL LOW (ref 12.0–15.0)
MCH: 29.7 pg (ref 26.0–34.0)
MCHC: 33.6 g/dL (ref 30.0–36.0)
MCV: 88.3 fL (ref 78.0–100.0)
Platelets: 226 10*3/uL (ref 150–400)
RBC: 3.17 MIL/uL — ABNORMAL LOW (ref 3.87–5.11)
RDW: 12.6 % (ref 11.5–15.5)
WBC: 4.9 10*3/uL (ref 4.0–10.5)

## 2015-04-02 LAB — MAGNESIUM
MAGNESIUM: 0.9 mg/dL — AB (ref 1.7–2.4)
Magnesium: 2.3 mg/dL (ref 1.7–2.4)

## 2015-04-02 MED ORDER — MAGNESIUM SULFATE 4 GM/100ML IV SOLN
4.0000 g | Freq: Once | INTRAVENOUS | Status: AC
  Administered 2015-04-02: 4 g via INTRAVENOUS
  Filled 2015-04-02: qty 100

## 2015-04-02 MED ORDER — LEVOTHYROXINE SODIUM 100 MCG PO TABS
100.0000 ug | ORAL_TABLET | Freq: Every day | ORAL | Status: DC
  Administered 2015-04-02 – 2015-04-03 (×2): 100 ug via ORAL
  Filled 2015-04-02 (×2): qty 1

## 2015-04-02 MED ORDER — POTASSIUM CHLORIDE CRYS ER 20 MEQ PO TBCR
40.0000 meq | EXTENDED_RELEASE_TABLET | Freq: Two times a day (BID) | ORAL | Status: AC
  Administered 2015-04-02 (×2): 40 meq via ORAL
  Filled 2015-04-02 (×2): qty 2

## 2015-04-02 NOTE — Progress Notes (Signed)
[facility] PROGRESS NOTE  [name] WUJ:[id number] DOB: [birth year] DOA: 04/01/2015 PCP: Tina Harp, MD  Assessment/Plan: 1. Hypokalemia 1. Suspect secondary to severe malnutrition as pt reports not eating well for at least 2 months 2. Still low today 3. Given additional doses of KCl 4. Cont to replace Mg 5. Hemodynamically stable 2. Hypomagnesemia 1. Presented low at 1.0 2. Replaced but still low today 3. Check tox screen neg 3. Hypothyroid 1. Free t4 elevated 2. Have decreased dose of levothyroxine from to 3. Would repeat TSH in 4-6 weeks 4. Hx diastolic chf 1. Clinically mildly dehydrated on presentation 2. Tolerating diet 3. Will hold further IVF for now 5. HLD 1. Stable 2. On statin 6. HTN 1. BP stable 2. Cont monitor 7. DVT prophylaixs 1. On xarelto  Code Status: Full Family Communication: Pt in room (indicate person spoken with, relationship, and if by phone, the number) Disposition Plan: Pending   Consultants:    Procedures:    Antibiotics:   (indicate start date, and stop date if known)  HPI/Subjective: Feels better. Eager to go home  Objective: Filed Vitals:   04/01/15 2048 04/02/15 0300 04/02/15 0447 04/02/15 1353  BP: 131/77  123/70 111/70  Pulse: 55  51 58  Temp: 98.5 F (36.9 C)  98.1 F (36.7 C) 98.1 F (36.7 C)  TempSrc: Oral  Oral Oral  Resp: Height:      Weight:   60.3 kg (132 lb 15 oz)   SpO2: 100% 97% 100% 100%    Intake/Output Summary (Last 24 hours) at 04/02/15 1550 Last data filed at 04/02/15 1500  Gross per 24 hour  Intake   2895 ml  Output    751 ml  Net   2144 ml   Filed Weights   04/01/15 1645 04/02/15 0447  Weight: 58.333 kg (128 lb 9.6 oz) 60.3 kg (132 lb 15 oz)    Exam:   General:  Awake, in nad  Cardiovascular: regular, s1, s2  Respiratory: normal resp effort, no wheezing  Abdomen: soft,nondistended  Musculoskeletal: perfused,no clubbing   Data  Reviewed: Basic Metabolic Panel:  Recent Labs Lab 03/31/15 1306 04/01/15 1005 04/01/15 1614 04/02/15 0625  NA 140 140  --  140  K 2.8* 2.5*  --  2.9*  CL 104 109  --  113*  CO2 23 23  --  20*  GLUCOSE 82 96  --  92  BUN 19 20  --  13  CREATININE 1.07 1.26* 1.10* 1.01*  CALCIUM 8.3* 8.2*  --  7.8*  MG  --  1.0*  --  0.9*   Liver Function Tests:  Recent Labs Lab 03/31/15 1306 04/01/15 1005 04/02/15 0625  AST ALT ALKPHOS 69 65 62  BILITOT 0.5 0.4 0.3  PROT 6.3 6.5 5.9*  ALBUMIN 3.5 3.0* 2.8*   No results for input(s): LIPASE, AMYLASE in the last 168 hours. No results for input(s): AMMONIA in the last 168 hours. CBC:  Recent Labs Lab 03/31/15 1306 04/01/15 1005 04/01/15 1614 04/02/15 0625  WBC 5.9 5.3 4.7 4.9  NEUTROABS 4.5 3.9  --   --   HGB 10.8* 10.3* 9.8* 9.4*  HCT 32.5* 29.3* 28.7* 28.0*  MCV 91.3 88.8 88.0 88.3  PLT 293 248 211 226   Cardiac Enzymes: No results for input(s): CKTOTAL, CKMB, CKMBINDEX, TROPONINI in the last 168 hours. BNP (last 3 results)  Recent Labs  10/11/14  3790 11/17/14 2107  BNP 213.0* 69.0    ProBNP (last 3 results)  Recent Labs  11/08/14 1247  PROBNP 89.0    CBG: No results for input(s): GLUCAP in the last 168 hours.  No results found for this or any previous visit (from the past 240 hour(s)).   Studies: No results found.  Scheduled Meds: . [COMPLETED] sodium chloride   Intravenous STAT  . amiodarone  200 mg Oral Daily  . atorvastatin  20 mg Oral Daily  . buPROPion  300 mg Oral q morning - 10a  . clopidogrel  75 mg Oral Q breakfast  . feeding supplement (ENSURE ENLIVE)  237 mL Oral BID BM  . levothyroxine  100 mcg Oral QAC breakfast  . potassium chloride  40 mEq Oral BID  . rivaroxaban  20 mg Oral Q supper  . sodium chloride  3 mL Intravenous Q12H  . tamsulosin  0.4 mg Oral Daily  . topiramate  50 mg Oral BID   Continuous Infusions:   Active Problems:   Hypothyroidism   Chronic  diastolic heart failure   CAD '07, LAD PCI 2012, SVG-PDA PTCA 11/10/14   Cardiomyopathy-h/o tachycardia mediated-EF 65% per echo March 2016   Hyperlipidemia   Hypokalemia   Chronic diastolic CHF (congestive heart failure), NYHA class 2   Hypomagnesemia    [name] K  [facility] Pager [phone]. If 7PM-7AM, please contact night-coverage at [email or web address], password [facility] 04/02/2015, 3:50 PM

## 2015-04-02 NOTE — Progress Notes (Signed)
Pt had small brown stool with one formed ball and some liquid.  Reports to nurse that "I have several small loose stools today"  When pressed for number of stools she states "probably one per hour - I think I am passing gas and some liquid stool comes out".  While performing pericare, moderate amount of stool residue noted.  Per new policy I have placed her on contact precautions and will await rounding doctor to order C-diff specimen or not.  Will monitor stool output tonight.

## 2015-04-02 NOTE — Clinical Social Work Note (Signed)
Clinical Social Work Assessment  Patient Details  Name: [name] MRN: [id number] Date of Birth: 1956/04/04  Date of referral:  04/02/15               Reason for consult:  Other (Comment Required) (transportation needs)                Permission sought to share information with:    Permission granted to share information::     Name::        Agency::     Relationship::     Contact Information:     Housing/Transportation Living arrangements for the past 2 months:  Single Family Home Source of Information:  Patient Patient Interpreter Needed:    Criminal Activity/Legal Involvement Pertinent to Current Situation/Hospitalization:    Significant Relationships:  Adult Children Lives with:  Roommate Do you feel safe going back to the place where you live?    Need for family participation in patient care:     Care giving concerns:  No caregiver   Facilities manager / plan:  CSW met with pt at bedside to assess transportation needs.  CSW explained role and prompted pt to discuss history and current needs.  CSW encouraged pt to explore thoughts and feelings related to a need for mental health, psychiatric or other services.  CSW provided information about [facility] services and pt had already applied but stated she thinks they lost her paperwork.  Pt will complete paperwork again for [employer].  Employment status:  Disabled (Comment on whether or not currently receiving Disability) Insurance information:  Medicare PT Recommendations:  Not assessed at this time Information / Referral to community resources:     Patient/Family's Response to care:  Pt discussed living with 2 roommates that are males and not being happy about the way they live stating that "they do not clean up after themselves.  Pt discussed having a car but having high car payments and not feeling safe driving.  Pt stated that she does not trust her judgement when driving as she takes multiple medications for depression.  Pt  stated that she has fatigue and does not eat right.  Pt stated that she did see a mental health therapist at one time and it was helpful but she stopped 6 months ago.  Pt stated that she sees Dr. Ardis Rowan for her psychotropic needs.  Patient/Family's Understanding of and Emotional Response to Diagnosis, Current Treatment, and Prognosis:  Pt appears to understand her diagnoses and was hopeful that she will get better.  Emotional Assessment Appearance:  Appears stated age Attitude/Demeanor/Rapport:   (cooperative) Affect (typically observed):  Accepting Orientation:  Oriented to Self, Oriented to Place, Oriented to  Time, Oriented to Situation Alcohol / Substance use:    Psych involvement (Current and /or in the community):     Discharge Needs  Concerns to be addressed:    Readmission within the last 30 days:    Current discharge risk:    Barriers to Discharge:  No Barriers Identified   Carlean Jews, LCSW 04/02/2015, 4:47 PM

## 2015-04-02 NOTE — Care Management Note (Signed)
Case Management Note  Patient Details  Name: [name] MRN: [id number] Date of Birth: [birth year]  Subjective/Objective:                    Action/Plan:  Discharge planning  Expected Discharge Date:                  Expected Discharge Plan:  Home/Self Care  In-House Referral:     Discharge planning Services  CM Consult, Medication Assistance  Post Acute Care Choice:    Choice offered to:     DME Arranged:    DME Agency:     HH Arranged:    HH Agency:     Status of Service:  Completed, signed off  Medicare Important Message Given:    Date Medicare IM Given:    Medicare IM give by:    Date Additional Medicare IM Given:    Additional Medicare Important Message give by:     If discussed at Long Length of Stay Meetings, dates discussed:    Additional Comments: CM spoke with patient at the bedside. States she has difficulty affording her medication co-pays. She is participating in the Xarelto Patient Assistance Program. Provided patient with discount cards for all prescription medications and the LIS Program information sheet. Discussed the option of transferring her prescription to [facility] which offers lower prices on several of her prescription medications.  Antony Haste, RN 04/02/2015, 11:30 AM

## 2015-04-03 DIAGNOSIS — E032 Hypothyroidism due to medicaments and other exogenous substances: Secondary | ICD-10-CM

## 2015-04-03 DIAGNOSIS — E876 Hypokalemia: Secondary | ICD-10-CM | POA: Diagnosis not present

## 2015-04-03 DIAGNOSIS — E785 Hyperlipidemia, unspecified: Secondary | ICD-10-CM | POA: Diagnosis not present

## 2015-04-03 DIAGNOSIS — E43 Unspecified severe protein-calorie malnutrition: Secondary | ICD-10-CM

## 2015-04-03 DIAGNOSIS — I429 Cardiomyopathy, unspecified: Secondary | ICD-10-CM | POA: Diagnosis not present

## 2015-04-03 DIAGNOSIS — I5032 Chronic diastolic (congestive) heart failure: Secondary | ICD-10-CM | POA: Diagnosis not present

## 2015-04-03 LAB — COMPREHENSIVE METABOLIC PANEL
ALK PHOS: 63 U/L (ref 38–126)
ALT: 13 U/L — AB (ref 14–54)
AST: 15 U/L (ref 15–41)
Albumin: 2.7 g/dL — ABNORMAL LOW (ref 3.5–5.0)
Anion gap: 3 — ABNORMAL LOW (ref 5–15)
BUN: 11 mg/dL (ref 6–20)
CHLORIDE: 115 mmol/L — AB (ref 101–111)
CO2: 21 mmol/L — ABNORMAL LOW (ref 22–32)
CREATININE: 1.04 mg/dL — AB (ref 0.44–1.00)
Calcium: 7.7 mg/dL — ABNORMAL LOW (ref 8.9–10.3)
GFR calc Af Amer: >60 mL/min (ref >60)
GFR, EST NON AFRICAN AMERICAN: 58 mL/min — AB (ref >60)
Glucose, Bld: 95 mg/dL (ref 65–99)
POTASSIUM: 3.6 mmol/L (ref 3.5–5.1)
Sodium: 139 mmol/L (ref 135–145)
TOTAL PROTEIN: 5.7 g/dL — AB (ref 6.5–8.1)
Total Bilirubin: 0.3 mg/dL (ref 0.3–1.2)

## 2015-04-03 LAB — MAGNESIUM: Magnesium: 2 mg/dL (ref 1.7–2.4)

## 2015-04-03 LAB — PHOSPHORUS: Phosphorus: 2.1 mg/dL — ABNORMAL LOW (ref 2.5–4.6)

## 2015-04-03 LAB — CALCIUM, IONIZED: Calcium, Ionized, Serum: 4.5 mg/dL (ref 4.5–5.6)

## 2015-04-03 MED ORDER — K PHOS MONO-SOD PHOS DI & MONO 155-852-130 MG PO TABS
250.0000 mg | ORAL_TABLET | Freq: Once | ORAL | Status: AC
  Administered 2015-04-03: 250 mg via ORAL
  Filled 2015-04-03: qty 1

## 2015-04-03 MED ORDER — LEVOTHYROXINE SODIUM 100 MCG PO TABS: 100.0000 ug | ORAL_TABLET | Freq: Every day | ORAL | Status: DC

## 2015-04-03 NOTE — Progress Notes (Signed)
Reviewed discharge information with patient and caregiver. Answered all questions. Patient/caregiver able to teach back medications and reasons to contact MD/911. Patient verbalizes importance of PCP follow up appointment.   

## 2015-04-03 NOTE — Care Management Note (Signed)
Case Management Note  Patient Details  Name: SILPA BARHAM MRN: [id number] Date of Birth: 1956/01/31  Subjective/Objective:  59 y/o f admitted w/chest pain. From home.                  Action/Plan:d/c home no needs or orders.   Expected Discharge Date:                  Expected Discharge Plan:  Home/Self Care  In-House Referral:     Discharge planning Services  CM Consult, Medication Assistance  Post Acute Care Choice:    Choice offered to:     DME Arranged:    DME Agency:     HH Arranged:    HH Agency:     Status of Service:  Completed, signed off  Medicare Important Message Given:    Date Medicare IM Given:    Medicare IM give by:    Date Additional Medicare IM Given:    Additional Medicare Important Message give by:     If discussed at Long Length of Stay Meetings, dates discussed:    Additional Comments:  Lanier Clam, RN 04/03/2015, 11:42 AM

## 2015-04-03 NOTE — Discharge Summary (Addendum)
Physician Discharge Summary  [name] JXB:[id number] DOB: Jul 10, 1956 DOA: 04/01/2015  PCP: [name], MD  Admit date: 04/01/2015 Discharge date: 04/03/2015  Time spent: 20 minutes  Recommendations for Outpatient Follow-up:  1. Follow up with PCP in 1-2 weeks 2. Please repeat BMET, Mg, and Phosphorus within 1-2 weeks 3. Please repeat TSH in 4-6 weeks. Levothyroxine dose was decreased this admission  Discharge Diagnoses:  Active Problems:   Hypothyroidism   Chronic diastolic heart failure   CAD '07, LAD PCI 2012, SVG-PDA PTCA 11/10/14   Cardiomyopathy-h/o tachycardia mediated-EF 65% per echo March 2016   Hyperlipidemia   Hypokalemia   Chronic diastolic CHF (congestive heart failure), NYHA class 2   Hypomagnesemia   Protein-calorie malnutrition, severe   Discharge Condition: Improved  Diet recommendation: Heart healthy  Filed Weights   04/01/15 1645 04/02/15 0447 04/03/15 0514  Weight: 58.333 kg (128 lb 9.6 oz) 60.3 kg (132 lb 15 oz) 61.2 kg (134 lb 14.7 oz)    History of present illness:  Please see admit h and p from 7/23 for details. Briefly, pt presented with markedly low K and Mg in the setting of profound malnutrition prior to admission. The patient was admitted for further work up  Hospital Course:  1. Hypokalemia 1. Suspect secondary to severe malnutrition as pt reports not eating well for at least 2 months prior to admission 2. Replaced by day of discharge 3. Replaced Mg per below 4. Remained hemodynamically stable 2. Hypomagnesemia 1. Presented low at 1.0 2. Ultimately replaced 3. Hypothyroid 1. Free T4 was elevated 2. Have decreased dose of levothyroxine from to 3. Would repeat TSH in 4-6 weeks as outpatient 4. Hx diastolic chf 1. Clinically mildly dehydrated on presentation 2. Tolerated diet 5. Hypophosphatemia 1. Secondary to malnutrition 2. replaced 6. Diarrhea 1. Suspect possible from mild refeeding syndrome 2. Lytes  largely corrected 3. Diarrhea improving 7. HLD 1. Stable 2. On statin 8. HTN 1. BP remained stable 9. DVT prophylaixs 1. On xarelto 10. Severe protein calorie malnutrition 1. Nutrition consulted  Discharge Exam: Filed Vitals:   04/02/15 0447 04/02/15 1353 04/02/15 2010 04/03/15 0514  BP: 123/70 111/70 125/68 139/74  Pulse: 51 58 57 59  Temp: 98.1 F (36.7 C) 98.1 F (36.7 C) 98.5 F (36.9 C) 98.3 F (36.8 C)  TempSrc: Oral Oral Oral Oral  Resp: Height:      Weight: 60.3 kg (132 lb 15 oz)   61.2 kg (134 lb 14.7 oz)  SpO2: 100% 100% 100% 100%    General: Awake, in nad Cardiovascular: regular, s1, s2 Respiratory: normal resp effort, no wheezing  Discharge Instructions    Medication List    TAKE these medications        amiodarone 200 MG tablet  Commonly known as:  PACERONE  Take 1 tablet (200 mg total) by mouth daily.     atorvastatin 20 MG tablet  Commonly known as:  LIPITOR  Take 1 tablet (20 mg total) by mouth daily.     buPROPion 150 MG 24 hr tablet  Commonly known as:  WELLBUTRIN XL  3 qam     clopidogrel 75 MG tablet  Commonly known as:  PLAVIX  Take 1 tablet (75 mg total) by mouth daily with breakfast.     levothyroxine 100 MCG tablet  Commonly known as:  SYNTHROID, LEVOTHROID  Take 1 tablet (100 mcg total) by mouth daily before breakfast.     nitroGLYCERIN 0.4 MG  SL tablet  Commonly known as:  NITROSTAT  Place 1 tablet (0.4 mg total) under the tongue every 5 (five) minutes as needed for chest pain.     ondansetron 4 MG disintegrating tablet  Commonly known as:  ZOFRAN-ODT  DISSOLVE 1 TABLET ON THE TONGUE EVERY 8 HOURS AS NEEDED FOR NAUSEA     potassium chloride SA 20 MEQ tablet  Commonly known as:  K-DUR,KLOR-CON  Take 1 tablet (20 mEq total) by mouth 3 (three) times daily.     rivaroxaban 20 MG Tabs tablet  Commonly known as:  XARELTO  Take 1 tablet (20 mg total) by mouth daily with supper.     tamsulosin 0.4 MG Caps  capsule  Commonly known as:  FLOMAX  Take 1 capsule (0.4 mg total) by mouth daily.     topiramate 50 MG tablet  Commonly known as:  TOPAMAX  Take 1 tablet (50 mg total) by mouth 2 (two) times daily.     traMADol 50 MG tablet  Commonly known as:  ULTRAM  TAKE 2 TABLETS BY MOUTH EVERY 6 HOURS AS NEEDED FOR MODERATE PAIN     vitamin B-12 1000 MCG tablet  Commonly known as:  CYANOCOBALAMIN  Take 1,000 mcg by mouth daily.       Allergies  Allergen Reactions  . Avelox [Moxifloxacin Hcl In Nacl] Other (See Comments)    Mental breakdown.  [name] Amoxicillin Hives  . Hydrocodone Itching  . Oxycodone Itching  . Penicillins Hives  . Sulfa Antibiotics Other (See Comments)    unknown   Follow-up Information    Follow up with [name], MD. Schedule an appointment as soon as possible for a visit in 1 week.   Specialties:  Internal Medicine, Pediatrics   Why:  Hospital follow up   Contact information:   [address] [place] [facility] [place] [address] [phone]        The results of significant diagnostics from this hospitalization (including imaging, microbiology, ancillary and laboratory) are listed below for reference.    Significant Diagnostic Studies: No results found.  Microbiology: No results found for this or any previous visit (from the past 240 hour(s)).   Labs: Basic Metabolic Panel:  Recent Labs Lab 03/31/15 1306 04/01/15 1005 04/01/15 1614 04/02/15 0625 04/02/15 1544 04/03/15 0519  NA 140 140  --  140  --  139  K 2.8* 2.5*  --  2.9*  --  3.6  CL 104 109  --  113*  --  115*  CO2 23 23  --  20*  --  21*  GLUCOSE 82 96  --  92  --  95  BUN 19 20  --  13  --  11  CREATININE 1.07 1.26* 1.10* 1.01*  --  1.04*  CALCIUM 8.3* 8.2*  --  7.8*  --  7.7*  MG  --  1.0*  --  0.9* 2.3 2.0  PHOS  --   --   --   --   --  2.1*   Liver Function Tests:  Recent Labs Lab 03/31/15 1306 04/01/15 1005 04/02/15 0625 04/03/15 0519  AST 13 17 17 15   ALT 10 16 16   13*  ALKPHOS 69 65 62 63  BILITOT 0.5 0.4 0.3 0.3  PROT 6.3 6.5 5.9* 5.7*  ALBUMIN 3.5 3.0* 2.8* 2.7*   No results for input(s): LIPASE, AMYLASE in the last 168 hours. No results for input(s): AMMONIA in the last 168 hours. CBC:  Recent Labs Lab  03/31/15 1306 04/01/15 1005 04/01/15 1614 04/02/15 0625  WBC 5.9 5.3 4.7 4.9  NEUTROABS 4.5 3.9  --   --   HGB 10.8* 10.3* 9.8* 9.4*  HCT 32.5* 29.3* 28.7* 28.0*  MCV 91.3 88.8 88.0 88.3  PLT 293 248 211 226   Cardiac Enzymes: No results for input(s): CKTOTAL, CKMB, CKMBINDEX, TROPONINI in the last 168 hours. BNP: BNP (last 3 results)  Recent Labs  10/11/14 0954 11/17/14 2107  BNP 213.0* 69.0    ProBNP (last 3 results)  Recent Labs  11/08/14 1247  PROBNP 89.0    CBG: No results for input(s): GLUCAP in the last 168 hours.   Signed:  CHIU, STEPHEN K  [facility] 04/03/2015, 10:57 AM

## 2015-04-03 NOTE — Progress Notes (Signed)
Initial Nutrition Assessment  DOCUMENTATION CODES:   Severe malnutrition in context of social or environmental circumstances  INTERVENTION:  - Continue Ensure Enlive BID, each supplement provides 350 kcal and 20 grams of protein - Encourage PO intake of meals - RD will continue to monitor for needs  NUTRITION DIAGNOSIS:   Inadequate oral intake related to social / environmental circumstances as evidenced by per patient/family report.  GOAL:   Patient will meet greater than or equal to 90% of their needs  MONITOR:   PO intake, Supplement acceptance, Weight trends, Labs, I & O's  REASON FOR ASSESSMENT:   Malnutrition Screening Tool  ASSESSMENT:  59 y.o. female with PMH CAD, HTN, HLD , COPD, hypothyroid, systolic CHF who presented with recent hallucinations, seen at Urgent Care clinic later found to have low K of 2.8 and TSH of 0.015. Pt was referred to the ED where Mg was noted to be 1.0. Pt started on potassium replacement. Hospitalist consulted for admission. On further questioning, pt admits to not eating well over the past two months secondary to financial strains.   Pt seen for MST. BMI indicates normal weight status. Pt had bacon, a biscuit, and pineapple pieces for breakfast and denies abdominal pain or nausea after intakes. Per chart review and discussion with pt, she has been unable to eat well at home x2 months PTA due to finances. Pt states she has been skipping breakfast and lunch and for dinner her son will bring her items such as a sub from [employer] [employer] or a hamburger from [employer].   She does not have access to a kitchen but is able to store shelf-stable items in her living space which do not require cooking. She has talked to her son about getting a small refrigerator but indicates it will be several months before she is able to save enough to purchase this.  PTA she did have nutrition supplements but states she does not like them if they are not cold. Since admission, she  has been offered Ensure [place] but states she has been declining it due to feeling full from meals.   She asks about good food sources for K and Mg. Talked with her about options available due to food preferences and offerings at restaurants and pt is interested in incorporating more potatoes/sweet potatoes in her diet.  She indicates that 4 years ago she had a stroke. Prior to that she weighed 240 lbs and 1 year ago was 140 lbs. Pt feels she currently weighs ~120 lbs. Per chart review, she has lost 23 lbs (15% body weight) in the past 4 months which is significant for time frame.  Moderate muscle and mild fat wasting noted. Not able to meet needs PTA due to financial constraints and food availability. Medications reviewed. Labs reviewed; Cl: 115 mmol/L, creatinine elevated, Ca: 7.7 mg/dL, GFR: 58.    Diet Order:  Regular  Skin:  Reviewed, no issues  Last BM:  7/24  Height:   Ht Readings from Last 1 Encounters:  04/01/15  (1.626 m)    Weight:   Wt Readings from Last 1 Encounters:  04/03/15 134 lb 14.7 oz (61.2 kg)    Ideal Body Weight:  54.54 kg (kg)  Wt Readings from Last 10 Encounters:  04/03/15 134 lb 14.7 oz (61.2 kg)  03/31/15 127 lb (57.607 kg)  02/24/15 138 lb 4.8 oz (62.732 kg)  02/22/15 137 lb 6.4 oz (62.324 kg)  02/03/15 140 lb 3.2 oz (63.594 kg)  01/24/15 141  lb (63.957 kg)  12/28/14 139 lb (63.05 kg)  12/22/14 143 lb 6.4 oz (65.046 kg)  12/14/14 156 lb 15.6 oz (71.204 kg)  11/11/14 157 lb 10.1 oz (71.5 kg)    BMI:  Body mass index is 23.15 kg/(m^2).  Estimated Nutritional Needs:   Kcal:  1400-1600  Protein:  50-60 grams  Fluid:  2.5 L/day  EDUCATION NEEDS:   No education needs identified at this time    Trenton Gammon, RD, LDN Inpatient Clinical Dietitian Pager # [phone] After hours/weekend pager # [phone]

## 2015-04-04 ENCOUNTER — Telehealth: Payer: Self-pay | Admitting: *Deleted

## 2015-04-04 NOTE — Telephone Encounter (Signed)
Transition Care Management Follow-up Telephone Call  How have you been since you were released from the hospital? okay   Do you understand why you were in the hospital? yes   Do you understand the discharge instrcutions? yes  Items Reviewed:  Medications reviewed: yes  Allergies reviewed: yes  Dietary changes reviewed: yes  Referrals reviewed: yes   Functional Questionnaire:   Activities of Daily Living (ADLs):   She states they are independent in the following: ambulation, bathing and hygiene, feeding, continence, grooming, toileting and dressing States they require assistance with the following: none   Any transportation issues/concerns?: yes - she will bring forms with her to her visit   Any patient concerns? no   Confirmed importance and date/time of follow-up visits scheduled: yes   Confirmed with patient if condition begins to worsen call PCP or go to the ER.  Patient was given the Call-a-Nurse line [phone]: yes Patient was discharged 04/03/15 Patient was discharged to her home Patient to call back to schedule an appointment with Dr Fabian Sharp

## 2015-04-04 NOTE — Telephone Encounter (Signed)
Left message on machine for patient to return our call 

## 2015-04-05 ENCOUNTER — Other Ambulatory Visit: Payer: Self-pay | Admitting: Family Medicine

## 2015-04-05 NOTE — Telephone Encounter (Signed)
Patient has an appointment with Dr Fabian Sharp 04/10/15 at 8:45 am

## 2015-04-10 ENCOUNTER — Encounter: Payer: Self-pay | Admitting: Internal Medicine

## 2015-04-10 ENCOUNTER — Other Ambulatory Visit: Payer: Self-pay | Admitting: Family Medicine

## 2015-04-10 ENCOUNTER — Ambulatory Visit (INDEPENDENT_AMBULATORY_CARE_PROVIDER_SITE_OTHER): Payer: Medicare Other | Admitting: Internal Medicine

## 2015-04-10 VITALS — BP 134/80 | Temp 98.3°F | Ht 64.0 in | Wt 129.9 lb

## 2015-04-10 DIAGNOSIS — E538 Deficiency of other specified B group vitamins: Secondary | ICD-10-CM | POA: Diagnosis not present

## 2015-04-10 DIAGNOSIS — Z91120 Patient's intentional underdosing of medication regimen due to financial hardship: Secondary | ICD-10-CM

## 2015-04-10 DIAGNOSIS — Z724 Inappropriate diet and eating habits: Secondary | ICD-10-CM

## 2015-04-10 DIAGNOSIS — N183 Chronic kidney disease, stage 3 unspecified: Secondary | ICD-10-CM

## 2015-04-10 DIAGNOSIS — Z5900 Homelessness unspecified: Secondary | ICD-10-CM

## 2015-04-10 DIAGNOSIS — E43 Unspecified severe protein-calorie malnutrition: Secondary | ICD-10-CM

## 2015-04-10 DIAGNOSIS — Z87898 Personal history of other specified conditions: Secondary | ICD-10-CM

## 2015-04-10 DIAGNOSIS — Z7409 Other reduced mobility: Secondary | ICD-10-CM

## 2015-04-10 DIAGNOSIS — E039 Hypothyroidism, unspecified: Secondary | ICD-10-CM

## 2015-04-10 DIAGNOSIS — R54 Age-related physical debility: Secondary | ICD-10-CM

## 2015-04-10 DIAGNOSIS — E46 Unspecified protein-calorie malnutrition: Secondary | ICD-10-CM | POA: Diagnosis not present

## 2015-04-10 DIAGNOSIS — D519 Vitamin B12 deficiency anemia, unspecified: Secondary | ICD-10-CM | POA: Diagnosis not present

## 2015-04-10 DIAGNOSIS — E639 Nutritional deficiency, unspecified: Secondary | ICD-10-CM

## 2015-04-10 DIAGNOSIS — T730XXA Starvation, initial encounter: Secondary | ICD-10-CM

## 2015-04-10 DIAGNOSIS — Z789 Other specified health status: Secondary | ICD-10-CM

## 2015-04-10 DIAGNOSIS — Z8673 Personal history of transient ischemic attack (TIA), and cerebral infarction without residual deficits: Secondary | ICD-10-CM

## 2015-04-10 MED ORDER — CYANOCOBALAMIN 1000 MCG/ML IJ SOLN
1000.0000 ug | Freq: Once | INTRAMUSCULAR | Status: AC
  Administered 2015-04-10: 1000 ug via INTRAMUSCULAR

## 2015-04-10 NOTE — Progress Notes (Signed)
Pre visit review using our clinic review tool, if applicable. No additional management support is needed unless otherwise documented below in the visit note.  Chief Complaint  Patient presents with  . Hospital Follow Up    transitional care     HPI: Patient comes in today as follow up from hospitalization formalnutriion with hypokalemia  Diarrhea    Cad spcva lipid hx afib on anticoagulant  ht  Depression  Hypothyroid with current over suppression. Since she has been home she is taking the potassium in filling some better and her diarrhea is better she comes in today with her son who help drive her here. She hands me a scat application and asked to fill it out again. Apparently the agency couldn't find the application and we have to start all over again she and I don't have another copy. Had nutrition consult in hosp bu no op fu noted  Apparently had social work consult in the hospital but she doesn't have transportation yet and her food stamps assistance ran out in February and she hasn't had a chance to get it renewed yet. She tends to be a picky eater and doesn't want to eat some of the foods that are available and the kitchen that she shares is messy and people don't clean up after themselves. She is looking into getting a refrigerator that is small and dorm like but doesn't have the money now but may in a few months. Her son states she really have a talk with the other people in the house. Her TSH was over suppressed when she was in the hospital and her dose was decreased to 100 g of levothyroxine but she hasn't yet been able to do this because she can't afford to refill until this week. She is currently on so security disability. She has no recent falling and is actually walking a little better. No active bleeding. ROS: See pertinent positives and negatives per HPI. Last week she had hallucinations and went in to urgent care ED for evaluation. It was felt to be something related to her  medicines at that time they did do repeat lab tests and her potassium was 3.6 and stable. Hallucinations are resolved. She had a low B12 level and just started taking her oral B12.  Past Medical History  Diagnosis Date  . CAD (coronary artery disease)     a.  cath 09/2010: LAD stent patent, S-Int/dCFX ok, S-PDA ok, L-LAD atretic;  b. Lexiscan Myoview (02/2014):  no ischemia, EF 55%; c. 11/2014 Cath/PCI: LM nl, LAD 20pISR, LCX 80-68m, OM1 nl, RI 70p, RCA 40-45m, RPDA 95ost/95-34m (PTCA only w/ reduction to 50p/87m), 60d, L->LAD atretic, VG->RI->OM nl, VG->PDA 100p.  [name] Hypertension   . Hx of CABG   . Hx of transesophageal echocardiography (TEE) for monitoring 11/2010    TEE 11/2010: EF 60-65%, BAE, trivial atrial septal shunt;  right heart cath in 10/2010 with elevated R and L heart pressures and diuretic started  . HLD (hyperlipidemia)   . Hypothyroidism   . COPD (chronic obstructive pulmonary disease)   . Pulmonary nodules     repeat CT due in 11/2011  . Acute right MCA stroke 11/07/10  . 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 Eczema   . Depression   . Atrial fibrillation     a. s/p TEE-DCCV 02/2104; b. Xarelto started  . Cardiomyopathy with EF 40% at TEE 02/17/14 (likely tachycardia mediated - Myoview 02/19/14 neg for  ischemia with normal EF) 02/18/2014  . 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 Sleep apnea     recent sleep study  in 04/2014 per chart review  shows no significant OSA  . Dysrhythmia 10/2014    atrial fib with rvr  . GERD (gastroesophageal reflux disease)   . Headache     Family History  Problem Relation Age of Onset  . COPD Mother   . Heart disease Mother   . Arthritis Mother     Rheumatoid and PMR  . Osteoporosis Mother     Mom fractured hip  . Diabetes type II Mother   . Depression Mother   . Heart attack Father   . Depression Father   . Hypertension Father   . Alcohol abuse Father   . Depression Sister   . Anxiety  disorder Sister   . Drug abuse Sister   . Stroke Neg Hx     History   Social History  . Marital Status: Divorced    Spouse Name: N/A  . Number of Children: 2  . Years of Education: N/A   Occupational History  . DISABLE     Social History Main Topics  . Smoking status: Former Smoker -- 0.25 packs/day for 30 years    Types: Cigarettes    Quit date: 09/10/2014  . Smokeless tobacco: Never Used  . Alcohol Use: No  . Drug Use: No  . Sexual Activity: Not Currently   Other Topics Concern  . None   Social History Narrative   Divorced   After stroke was living with sister and mother after sister asked her to leave. Mom has since deceased    Difficulties over control.    Then moved back in for about 6 weeks.   . tranportaion difficult son  helping   Ex-smoker   Was living at friends  Sister and her had argument and she was told to leave .    She was living in a homeless shelter for the last 3 weeks [facility]    son had help with transportation and medication       Work status regular  before got sick on short-term disability now lost t insurance after the stroke and couldn't work was  denied ssi    2 times.  She is not eligible for Medicaid because she doesn't have dependent children.         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   Lives in  rented house 2 house mates males keep tp self has her own b room  Doing well     Outpatient Prescriptions Prior to Visit  Medication Sig Dispense Refill  . amiodarone (PACERONE) 200 MG tablet Take 1 tablet (200 mg total) by mouth daily. 30 tablet 4  . atorvastatin (LIPITOR) 20 MG tablet Take 1 tablet (20 mg total) by mouth daily. 30 tablet 1  . buPROPion (WELLBUTRIN XL) 150 MG 24 hr tablet 3 qam (Patient taking  differently: Take 300 mg by mouth every morning. ) 90 tablet 5  . clopidogrel (PLAVIX) 75 MG tablet Take 1 tablet (75 mg total) by mouth daily with breakfast. 30 tablet 6  . nitroGLYCERIN (NITROSTAT)  0.4 MG SL tablet Place 1 tablet (0.4 mg total) under the tongue every 5 (five) minutes as needed for chest pain. 90 tablet 3  . ondansetron (ZOFRAN-ODT) 4 MG disintegrating tablet DISSOLVE 1 TABLET ON THE TONGUE EVERY 8 HOURS AS NEEDED FOR NAUSEA 10 tablet 0  . potassium chloride SA (K-DUR,KLOR-CON) 20 MEQ tablet Take 1 tablet (20 mEq total) by mouth 3 (three) times daily. 90 tablet 6  . rivaroxaban (XARELTO) 20 MG TABS tablet Take 1 tablet (20 mg total) by mouth daily with supper. 30 tablet 3  . tamsulosin (FLOMAX) 0.4 MG CAPS capsule Take 1 capsule (0.4 mg total) by mouth daily. 30 capsule 0  . topiramate (TOPAMAX) 50 MG tablet Take 1 tablet (50 mg total) by mouth 2 (two) times daily. 90 tablet 1  . traMADol (ULTRAM) 50 MG tablet TAKE 2 TABLETS BY MOUTH EVERY 6 HOURS AS NEEDED FOR MODERATE PAIN 20 tablet 0  . vitamin B-12 (CYANOCOBALAMIN) 1000 MCG tablet Take 1,000 mcg by mouth daily.    [name] levothyroxine (SYNTHROID, LEVOTHROID) 100 MCG tablet Take 1 tablet (100 mcg total) by mouth daily before breakfast. (Patient not taking: Reported on 04/10/2015) 30 tablet 0   No facility-administered medications prior to visit.     EXAM:  BP 134/80 mmHg  Temp(Src) 98.3 F (36.8 C) (Oral)  Ht  (1.626 m)  Wt 129 lb 14.4 oz (58.922 kg)  BMI 22.29 kg/m2  Body mass index is 22.29 kg/(m^2).  GENERAL: vitals reviewed and listed above, alert, oriented, appears well hydrated slender and in no acute distress nontoxic well-groomed in one usual movements today HEENT: atraumatic, conjunctiva  clear, no obvious abnormalities on inspection of external nose and earsNECK: no obvious masses on inspection palpation  LUNGS: clear to auscultation bilaterally, no wheezes, rales or rhonchi, good air movement CV: Hr rr no  clubbing cyanosis or  peripheral edema nl cap refill  MS: moves all extremities without noticeable focal  abnormality her gait is more steady today and she can walk without a cane and turn. PSYCH: pleasant and cooperative appears more animated today with her son here and on different medicine Lab Results  Component Value Date   WBC 4.9 04/02/2015   HGB 9.4* 04/02/2015   HCT 28.0* 04/02/2015   PLT 226 04/02/2015   GLUCOSE 95 04/03/2015   CHOL 210* 03/14/2015   TRIG 82.0 03/14/2015   HDL 51.00 03/14/2015   LDLDIRECT 153.0 01/15/2010   LDLCALC 143* 03/14/2015   ALT 13* 04/03/2015   AST 15 04/03/2015   NA 139 04/03/2015   K 3.6 04/03/2015   CL 115* 04/03/2015   CREATININE 1.04* 04/03/2015   BUN 11 04/03/2015   CO2 21* 04/03/2015   TSH 0.015* 03/31/2015   INR 1.2* 11/08/2014   HGBA1C  02/11/2011    5.4 (NOTE)                                                                       According to the ADA Clinical Practice Recommendations for 2011, when HbA1c is used as a screening test:   >=6.5%   Diagnostic of Diabetes Mellitus           (if abnormal result  is confirmed)  5.7-6.4%  Increased risk of developing Diabetes Mellitus  References:Diagnosis and Classification of Diabetes Mellitus,Diabetes Care,2011,34(Suppl 1):S62-S69 and Standards of Medical Care in         Diabetes - 2011,Diabetes Care,2011,34  (Suppl 1):S11-S61.    ASSESSMENT AND PLAN:  Discussed the following assessment and plan:  Protein-calorie malnutrition - see text - Plan: Ambulatory referral to Home Health  B12 deficiency - Plan: cyanocobalamin ((VITAMIN B-12)) injection 1,000 mcg  Anemia, B12 deficiency - Plan: cyanocobalamin ((VITAMIN B-12)) injection 1,000 mcg  Hypothyroidism, unspecified hypothyroidism type - over suppressed  hasnt been able to afford new scrip from hospital until this week.   Intentional underdosing of medication regimen by patient due to financial hardship - unable to get 100 mcg of  synthroid until this weel - Plan: Ambulatory referral to Home Health  Frailty - Plan: Ambulatory referral to Home Health  Mobility impaired - scat  fporm was lost at the agency and need tp fill pout a gain - Plan: Ambulatory referral to Home Health  Nutrition deficiency due to insufficient food -  particular eater , refrig mates , finanacial ran out of snap in feb needs to get back on  transportaion scat  son  - Plan: Ambulatory referral to Home Health  Problems related to inappropriate diet and eating habits  Medically complex patient Patient recently hospitalized with severe hypokalemia and malnutrition. Causes include financial picky eating transportation medications. Concerned about her B12 level as this can certainly add to behavioral health neuropathy balance and gait. Son offered to bring her in for weekly shots and she can take orals in the meantime. I suspect this is a malnourishment problem and not an autoimmune deficiency. We will give weekly B12 shots and follow-up in about 6 weeks. It doesn't appear that social work nutrition evaluation  Support  continued continued after her hospitalization because she still needs help with transportation and finances medication and nutrition. See above thyroid. Told her I will refill out the scat form and get it to her for some by the end of the week.  Advised her son  to be present for nutrition evaluation referral because of barriers and he needs to be on the same team that can help her. She desperately does not want to go to assisted living and in their kinds of food.  Acts  someone who is developing an eating disorder regarding her previous swallowing dysfunction sensitivity to odors and textures after her stroke and may need a lot of encouragement to get back into more normal eating where hopefully financially appropriate and refrigerated food will be available.  She needs to get her food stamps back .   -Patient advised to return or notify  health care team  if symptoms worsen ,persist or new concerns arise. Total visit 50 mins > 50% spent counseling and coordinating care as indicated in above note and in instructions to patient .   Patient Instructions  Your b12 is low ..injection .  Weekly   For the next 6 weeks when can come in  Continue to take oral b12 .   Plan nutrition consult out patient.  Plan recheck thyroid level in about 6- 8 weeks.   Will work .  On the scat form and get it to you   return office visit on about 6-8 weeks   Can do labs at that time      Neta Mends. Panosh M.D. See hospital discharge instructions. Admit date: 04/01/2015 Discharge date: 04/03/2015  Time spent: 20 minutes  Recommendations for Outpatient Follow-up:  1. Follow up with PCP in 1-2 weeks 2. Please repeat BMET, Mg, and Phosphorus within 1-2 weeks 3. Please repeat TSH in 4-6 weeks. Levothyroxine dose was decreased this admission  Discharge Diagnoses:  Active Problems:  Hypothyroidism  Chronic diastolic heart failure  CAD '07, LAD PCI 2012, SVG-PDA PTCA 11/10/14  Cardiomyopathy-h/o tachycardia mediated-EF 65% per echo March 2016  Hyperlipidemia  Hypokalemia  Chronic diastolic CHF (congestive heart failure), NYHA class 2  Hypomagnesemia  Protein-calorie malnutrition, severe   Discharge Condition: Improved  Diet recommendation: Heart healthy  Filed Weights   04/01/15 1645 04/02/15 0447 04/03/15 0514  Weight: 58.333 kg (128 lb 9.6 oz) 60.3 kg (132 lb 15 oz) 61.2 kg (134 lb 14.7 oz)    History of present illness:  Please see admit h and p from 7/23 for details. Briefly, pt presented with markedly low K and Mg in the setting of profound malnutrition prior to admission. The patient was admitted for further work up  Hospital Course:  1. Hypokalemia 1. Suspect secondary to severe malnutrition as pt reports not eating well for at least 2 months prior to admission 2. Replaced by day of  discharge 3. Replaced Mg per below 4. Remained hemodynamically stable 2. Hypomagnesemia 1. Presented low at 1.0 2. Ultimately replaced 3. Hypothyroid 1. Free T4 was elevated 2. Have decreased dose of levothyroxine from to 3. Would repeat TSH in 4-6 weeks as outpatient 4. Hx diastolic chf 1. Clinically mildly dehydrated on presentation 2. Tolerated diet 5. Hypophosphatemia 1. Secondary to malnutrition 2. replaced 6. Diarrhea 1. Suspect possible from mild refeeding syndrome 2. Lytes largely corrected 3. Diarrhea improving 7. HLD 1. Stable 2. On statin 8. HTN 1. BP remained stable 9. DVT prophylaixs 1. On xarelto 10. Severe protein calorie malnutrition 1. Nutrition consulted

## 2015-04-10 NOTE — Patient Instructions (Signed)
Your b12 is low ..injection .  Weekly   For the next 6 weeks when can come in  Continue to take oral b12 .   Plan nutrition consult out patient.  Plan recheck thyroid level in about 6- 8 weeks.   Will work .  On the scat form and get it to you   return office visit on about 6-8 weeks   Can do labs at that time

## 2015-04-11 ENCOUNTER — Other Ambulatory Visit: Payer: Self-pay | Admitting: Neurology

## 2015-04-11 ENCOUNTER — Telehealth: Payer: Self-pay | Admitting: Internal Medicine

## 2015-04-11 DIAGNOSIS — I429 Cardiomyopathy, unspecified: Secondary | ICD-10-CM

## 2015-04-11 DIAGNOSIS — I5032 Chronic diastolic (congestive) heart failure: Secondary | ICD-10-CM

## 2015-04-11 DIAGNOSIS — Z8673 Personal history of transient ischemic attack (TIA), and cerebral infarction without residual deficits: Secondary | ICD-10-CM

## 2015-04-11 DIAGNOSIS — I4891 Unspecified atrial fibrillation: Secondary | ICD-10-CM

## 2015-04-11 DIAGNOSIS — N183 Chronic kidney disease, stage 3 unspecified: Secondary | ICD-10-CM

## 2015-04-11 DIAGNOSIS — I248 Other forms of acute ischemic heart disease: Secondary | ICD-10-CM

## 2015-04-11 DIAGNOSIS — J449 Chronic obstructive pulmonary disease, unspecified: Secondary | ICD-10-CM

## 2015-04-11 DIAGNOSIS — E43 Unspecified severe protein-calorie malnutrition: Secondary | ICD-10-CM

## 2015-04-11 DIAGNOSIS — E876 Hypokalemia: Secondary | ICD-10-CM

## 2015-04-11 NOTE — Telephone Encounter (Signed)
[name], [facility] received orders for nursing and social work. Right now they cannot accept pt for nursing bc they do not have the staff. So they must decline order

## 2015-04-13 NOTE — Telephone Encounter (Signed)
Referral placed for [facility] 

## 2015-04-13 NOTE — Telephone Encounter (Signed)
Spoke to the pt.  Informed her that I will now be sending her referral to another company.  Pt agrees and will wait.

## 2015-04-13 NOTE — Telephone Encounter (Signed)
Received a message that the pt does not qualify for [facility] due to her insurance.  Will try a different referral.

## 2015-04-14 NOTE — Telephone Encounter (Signed)
Then see her next week !

## 2015-04-14 NOTE — Telephone Encounter (Signed)
[facility] called to advise they will not be able to see pt in that 2 day windoe as requested may not be until Tues or Wed  Of next week b/cos staffing issue.  Will try, but doubt it.

## 2015-04-17 NOTE — Telephone Encounter (Signed)
Noted  

## 2015-04-19 ENCOUNTER — Ambulatory Visit (INDEPENDENT_AMBULATORY_CARE_PROVIDER_SITE_OTHER): Payer: Medicare Other | Admitting: Family Medicine

## 2015-04-19 DIAGNOSIS — I480 Paroxysmal atrial fibrillation: Secondary | ICD-10-CM | POA: Diagnosis not present

## 2015-04-19 DIAGNOSIS — I2510 Atherosclerotic heart disease of native coronary artery without angina pectoris: Secondary | ICD-10-CM | POA: Diagnosis not present

## 2015-04-19 DIAGNOSIS — E538 Deficiency of other specified B group vitamins: Secondary | ICD-10-CM | POA: Diagnosis not present

## 2015-04-19 DIAGNOSIS — I272 Other secondary pulmonary hypertension: Secondary | ICD-10-CM | POA: Diagnosis not present

## 2015-04-19 DIAGNOSIS — E43 Unspecified severe protein-calorie malnutrition: Secondary | ICD-10-CM | POA: Diagnosis not present

## 2015-04-19 DIAGNOSIS — I5032 Chronic diastolic (congestive) heart failure: Secondary | ICD-10-CM | POA: Diagnosis not present

## 2015-04-19 DIAGNOSIS — I10 Essential (primary) hypertension: Secondary | ICD-10-CM | POA: Diagnosis not present

## 2015-04-19 MED ORDER — CYANOCOBALAMIN 1000 MCG/ML IJ SOLN
1000.0000 ug | Freq: Once | INTRAMUSCULAR | Status: AC
  Administered 2015-04-19: 1000 ug via INTRAMUSCULAR

## 2015-04-20 ENCOUNTER — Telehealth: Payer: Self-pay | Admitting: Internal Medicine

## 2015-04-20 NOTE — Telephone Encounter (Signed)
[facility] needs verbal orders for skilled nursing 1 x1 / 2 wk 2/ 1 x 1  (one month of nursing)  pls order [facility] (Child psychotherapist)

## 2015-04-24 ENCOUNTER — Ambulatory Visit (INDEPENDENT_AMBULATORY_CARE_PROVIDER_SITE_OTHER): Payer: Medicare Other | Admitting: Family Medicine

## 2015-04-24 ENCOUNTER — Other Ambulatory Visit: Payer: Self-pay | Admitting: Family Medicine

## 2015-04-24 DIAGNOSIS — I2510 Atherosclerotic heart disease of native coronary artery without angina pectoris: Secondary | ICD-10-CM | POA: Diagnosis not present

## 2015-04-24 DIAGNOSIS — I480 Paroxysmal atrial fibrillation: Secondary | ICD-10-CM | POA: Diagnosis not present

## 2015-04-24 DIAGNOSIS — E538 Deficiency of other specified B group vitamins: Secondary | ICD-10-CM

## 2015-04-24 DIAGNOSIS — I10 Essential (primary) hypertension: Secondary | ICD-10-CM | POA: Diagnosis not present

## 2015-04-24 DIAGNOSIS — I272 Other secondary pulmonary hypertension: Secondary | ICD-10-CM | POA: Diagnosis not present

## 2015-04-24 DIAGNOSIS — I5032 Chronic diastolic (congestive) heart failure: Secondary | ICD-10-CM | POA: Diagnosis not present

## 2015-04-24 DIAGNOSIS — E43 Unspecified severe protein-calorie malnutrition: Secondary | ICD-10-CM | POA: Diagnosis not present

## 2015-04-24 MED ORDER — CYANOCOBALAMIN 1000 MCG/ML IJ SOLN
1000.0000 ug | Freq: Once | INTRAMUSCULAR | Status: AC
  Administered 2015-04-24: 1000 ug via INTRAMUSCULAR

## 2015-04-24 MED ORDER — ONDANSETRON 4 MG PO TBDP: ORAL_TABLET | ORAL | Status: DC

## 2015-04-24 NOTE — Telephone Encounter (Signed)
Please send in order as stated

## 2015-04-24 NOTE — Telephone Encounter (Signed)
Spoke to Arley at [facility] and gave verbal order for skilled nursing and community resources.

## 2015-04-24 NOTE — Telephone Encounter (Signed)
#  10 sent to the pharmacy by e-scribe.  Last filled 12/16/14.

## 2015-04-25 ENCOUNTER — Telehealth: Payer: Self-pay | Admitting: Internal Medicine

## 2015-04-25 DIAGNOSIS — I5032 Chronic diastolic (congestive) heart failure: Secondary | ICD-10-CM | POA: Diagnosis not present

## 2015-04-25 DIAGNOSIS — E43 Unspecified severe protein-calorie malnutrition: Secondary | ICD-10-CM | POA: Diagnosis not present

## 2015-04-25 DIAGNOSIS — I2510 Atherosclerotic heart disease of native coronary artery without angina pectoris: Secondary | ICD-10-CM | POA: Diagnosis not present

## 2015-04-25 DIAGNOSIS — I10 Essential (primary) hypertension: Secondary | ICD-10-CM | POA: Diagnosis not present

## 2015-04-25 DIAGNOSIS — I272 Other secondary pulmonary hypertension: Secondary | ICD-10-CM | POA: Diagnosis not present

## 2015-04-25 DIAGNOSIS — I480 Paroxysmal atrial fibrillation: Secondary | ICD-10-CM | POA: Diagnosis not present

## 2015-04-25 NOTE — Telephone Encounter (Signed)
[facility] Child psychotherapist needs two more plan of care approvals for housing options and transportation . Please call back with verbal

## 2015-04-25 NOTE — Telephone Encounter (Signed)
Called and spoke to [place] (Child psychotherapist) and gave permission to proceed with approvals for housing options and transportation.

## 2015-04-26 ENCOUNTER — Ambulatory Visit (INDEPENDENT_AMBULATORY_CARE_PROVIDER_SITE_OTHER): Payer: Medicare Other | Admitting: Psychiatry

## 2015-04-26 ENCOUNTER — Encounter (HOSPITAL_COMMUNITY): Payer: Self-pay | Admitting: Psychiatry

## 2015-04-26 DIAGNOSIS — F331 Major depressive disorder, recurrent, moderate: Secondary | ICD-10-CM

## 2015-04-26 MED ORDER — BUPROPION HCL ER (XL) 150 MG PO TB24: ORAL_TABLET | ORAL | Status: DC

## 2015-04-26 NOTE — Progress Notes (Signed)
[facility] MD Progress Note  04/26/2015 1:33 PM [name]  MRN:  [id number] Subjective:  Fairly good Principal Problem: Major depression, chronic, mild Diagnosis:  Major depression, chronic, mild Today the patient is feeling fairly well. She denies daily depression. Her son is found a job and is working. It is noted the patient is on disability for years ago after having a CVA. She is mildly cognitively impaired because of this. The patient has chronic migraines which are actually much better now on Topamax. I'm not sure the effects of Topamax on her cognitive abilities. The patient takes Wellbutrin. Since I've seen her she had an episode where she seemed to have visual hallucinations. She called here and we sent her to urgent care where they believed was related to high-dose Wellbutrin but I'm certain they're wrong. They then called her back and told her that her potassium was extremely low and come back to urgent care and she was ultimately admitted to the hospital where her potassium was corrected. Unfortunately they discharged her own only 300 mg Wellbutrin. Nonetheless her mood is stable. She is sleeping and eating fairly well. The patient says she has an appetite but has great problems using the community kitchen which has refrigerator that she says is filthy. She says she's forced to share their for greater with other people. The patient says she is concentrating fairly well. She denies worthlessness. She is not suicidal now. She enjoys watching TV. Her wishes in life are to have some financial income, her son to get a college degree and improve occupational status and to have some contact with her son who lives in [place]. The patient denies psychotic symptoms. She denies any chest discomfort or shortness of breath. At this time the patient is looking for new place to live. The patient takes supplement potassium on a regular basis and also takes B12 shots. She is under the medical care of Dr. Hubert Azure. The patient is stable at this time. Patient Active Problem List   Diagnosis Date Noted  . Protein-calorie malnutrition, severe [E43] 04/03/2015  . Hypomagnesemia [E83.42] 04/01/2015  . Chronic diastolic CHF (congestive heart failure), NYHA class 2 [I50.32] 01/24/2015  . Demand ischemia secondary to AF with RVR [I24.8] 12/13/2014  . CKD (chronic kidney disease), stage III [N18.3] 11/11/2014  . Hypokalemia [E87.6] 05/22/2014  . Mood disorder [F39] 03/20/2014  . HTN (hypertension) [I10] 03/20/2014  . Anemia, unspecified [D64.9] 03/20/2014  . Tobacco use disorder [Z72.0] 03/20/2014  . Atrial fibrillation with RVR [I48.91] 03/03/2014  . Hypotension [I95.9] 03/03/2014  . Pulmonary hypertension [I27.0] 03/03/2014  . COPD (chronic obstructive pulmonary disease) [J44.9] 03/03/2014  . History of right MCA stroke [Z86.73]   . Long-term (current) use of anticoagulants [Z79.01]   . 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 Hyperlipidemia [E78.5] 02/18/2014  . CAD '07, LAD PCI 2012, SVG-PDA PTCA 11/10/14 [I25.10] 02/16/2014  . Homelessness [Z59.0] 11/12/2011  . Chronic diastolic heart failure [I50.32]   . Pulmonary nodules [R91.8]   . Hypothyroidism [E03.9]   . OSA- C-pap intol [G47.33]    Total Time spent with patient: 30 minutes   Past Medical History:  Past Medical History  Diagnosis Date  . CAD (coronary artery disease)     a.  cath 09/2010: LAD stent patent, S-Int/dCFX ok, S-PDA ok, L-LAD atretic;  b. Lexiscan Myoview (02/2014):  no ischemia, EF 55%; c. 11/2014 Cath/PCI: LM nl, LAD 20pISR, LCX 80-70m, OM1 nl, RI 70p, RCA 40-81m,  RPDA 95ost/95-15m (PTCA only w/ reduction to 50p/77m), 60d, L->LAD atretic, VG->RI->OM nl, VG->PDA 100p.  [name] Hypertension   . Hx of CABG   . Hx of transesophageal echocardiography (TEE) for monitoring 11/2010    TEE 11/2010: EF 60-65%, BAE, trivial atrial septal shunt;  right heart cath in 10/2010 with elevated R  and L heart pressures and diuretic started  . HLD (hyperlipidemia)   . Hypothyroidism   . COPD (chronic obstructive pulmonary disease)   . Pulmonary nodules     repeat CT due in 11/2011  . Acute right MCA stroke 11/07/10  . 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 Eczema   . Depression   . Atrial fibrillation     a. s/p TEE-DCCV 02/2104; b. Xarelto started  . Cardiomyopathy with EF 40% at TEE 02/17/14 (likely tachycardia mediated - Myoview 02/19/14 neg for ischemia with normal EF) 02/18/2014  . 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 Sleep apnea     recent sleep study  in 04/2014 per chart review  shows no significant OSA  . Dysrhythmia 10/2014    atrial fib with rvr  . GERD (gastroesophageal reflux disease)   . Headache     Past Surgical History  Procedure Laterality Date  . Coronary artery bypass graft    . Debriment for infection in chest    . Chest wall reconstruction    . Bilateral knee surgery    . Bladder surgery    . Left mastoidectomy    . Hernia repair    . Cath 2012    . Tee without cardioversion N/A 02/17/2014    Procedure: TRANSESOPHAGEAL ECHOCARDIOGRAM (TEE);  Surgeon: Pricilla Riffle, MD;  Location: [facility] ENDOSCOPY;  Service: Cardiovascular;  Laterality: N/A;  . Cardioversion N/A 02/17/2014    Procedure: CARDIOVERSION;  Surgeon: Pricilla Riffle, MD;  Location[facility] ENDOSCOPY;  Service: Cardiovascular;  Laterality: N/A;  . Left and right heart catheterization with coronary angiogram N/A 11/10/2014    Procedure: LEFT AND RIGHT HEART CATHETERIZATION WITH CORONARY ANGIOGRAM;  Surgeon: Marykay Lex, MD;  Location: [facility] CATH LAB;  Service: Cardiovascular;  Laterality: N/A;   Family History:  Family History  Problem Relation Age of Onset  . COPD Mother   . Heart disease Mother   . Arthritis Mother     Rheumatoid and PMR  . Osteoporosis Mother     Mom fractured hip  . Diabetes type II Mother   . Depression Mother   . Heart attack  Father   . Depression Father   . Hypertension Father   . Alcohol abuse Father   . Depression Sister   . Anxiety disorder Sister   . Drug abuse Sister   . Stroke Neg Hx    Social History:  History  Alcohol Use No     History  Drug Use No    Social History   Social History  . Marital Status: Divorced    Spouse Name: N/A  . Number of Children: 2  . Years of Education: N/A   Occupational History  . DISABLE     Social History Main Topics  . Smoking status: Former Smoker -- 0.25 packs/day for 30 years    Types: Cigarettes    Quit date: 09/10/2014  . Smokeless tobacco: Never Used  . Alcohol Use: No  . Drug Use: No  . Sexual Activity: Not Currently  Other Topics Concern  . Not on file   Social History Narrative   Divorced   After stroke was living with sister and mother after sister asked her to leave. Mom has since deceased    Difficulties over control.    Then moved back in for about 6 weeks.   . tranportaion difficult son  helping   Ex-smoker   Was living at friends  Sister and her had argument and she was told to leave .    She was living in a homeless shelter for the last 3 weeks [facility]    son had help with transportation and medication       Work status regular  before got sick on short-term disability now lost t insurance after the stroke and couldn't work was  denied ssi    2 times.  She is not eligible for Medicaid because she doesn't have dependent children.         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   Lives in  rented house 2 house mates males keep tp self has her own b room  Doing well    Additional History:    Sleep: Good  Appetite:  Fair   Assessment:   Musculoskeletal: Strength & Muscle Tone: within normal limits Gait  & Station: normal Patient leans: Right   Psychiatric Specialty Exam: Physical Exam  ROS  There were no vitals taken for this visit.There is no weight on file to calculate BMI.  General Appearance: Casual  Eye Contact::  Good  Speech:  Clear and Coherent  Volume:  Normal  Mood:  Depressed  Affect:  Appropriate  Thought Process:  Coherent  Orientation:  Full (Time, Place, and Person)  Thought Content:  WDL  Suicidal Thoughts:  No  Homicidal Thoughts:  No  Memory:  NA  Judgement:  NA  Insight:  Good  Psychomotor Activity:  Normal  Concentration:  NA  Recall:  Good  Fund of Knowledge:Good  Language: Good  Akathisia:  No  Handed:  Right  AIMS (if indicated):     Assets:  Communication Skills  ADL's:  Intact  Cognition: WNL  Sleep:        Current Medications: Current Outpatient Prescriptions  Medication Sig Dispense Refill  . amiodarone (PACERONE) 200 MG tablet Take 1 tablet (200 mg total) by mouth daily. 30 tablet 4  . atorvastatin (LIPITOR) 20 MG tablet Take 1 tablet (20 mg total) by mouth daily. 30 tablet 1  . buPROPion (WELLBUTRIN XL) 150 MG 24 hr tablet 3 qam 90 tablet 5  . clopidogrel (PLAVIX) 75 MG tablet Take 1 tablet (75 mg total) by mouth daily with breakfast. 30 tablet 6  . levothyroxine (SYNTHROID, LEVOTHROID) 100 MCG tablet Take 1 tablet (100 mcg total) by mouth daily before breakfast. (Patient not taking: Reported on 04/10/2015) 30 tablet 0  . levothyroxine (SYNTHROID, LEVOTHROID) 125 MCG tablet TAKE 1 TABLET (125 MCG TOTAL) BY MOUTH DAILY.  11  . nitroGLYCERIN (NITROSTAT) 0.4 MG SL tablet Place 1 tablet (0.4 mg total) under the tongue every 5 (five) minutes as needed for chest pain. 90 tablet 3  . ondansetron (ZOFRAN-ODT) 4 MG disintegrating tablet DISSOLVE 1 TABLET ON THE TONGUE EVERY 8 HOURS AS NEEDED FOR  NAUSEA 10 tablet 0  . potassium chloride SA (K-DUR,KLOR-CON) 20 MEQ tablet Take 1 tablet (20 mEq total) by mouth 3 (three) times daily. 90 tablet 6  .  rivaroxaban (XARELTO) 20 MG TABS tablet Take 1 tablet (20 mg total) by mouth daily with supper. 30 tablet 3  . tamsulosin (FLOMAX) 0.4 MG CAPS capsule Take 1 capsule (0.4 mg total) by mouth daily. 30 capsule 0  . topiramate (TOPAMAX) 50 MG tablet TAKE 1 TABLET (50 MG TOTAL) BY MOUTH 2 (TWO) TIMES DAILY. 180 tablet 0  . traMADol (ULTRAM) 50 MG tablet TAKE 2 TABLETS BY MOUTH EVERY 6 HOURS AS NEEDED FOR MODERATE PAIN 20 tablet 0  . vitamin B-12 (CYANOCOBALAMIN) 1000 MCG tablet Take 1,000 mcg by mouth daily.     No current facility-administered medications for this visit.    Lab Results: No results found for this or any previous visit (from the past 48 hour(s)).  Physical Findings: AIMS:  , ,  ,  ,    CIWA:    COWS:     Treatment Plan Summary: At this time the patient return back to Wellbutrin 450 mg. The patient will start looking for a new place to live. Overall the patient is doing well. She has no physical complaints. She denies any neurological symptoms or cardiovascular complaints. Today we spent over 50% of the time reviewing her medications and educating her that Wellbutrin has no effect on potassium nor would it produce hallucinations. It is very likely the patient's diet is dramatically reduced in fact she was told that her low potassium was likely due to chronic malnutrition. The patient's problems besides depression is therefore is her environment she lives in. She'll make full efforts to try to get a new place to live. This patient to return to see me in 3 months.    Medical Decision Making:  New problem, with additional work up planned     Lucas Mallow 04/26/2015, 1:33 PM

## 2015-04-27 DIAGNOSIS — E43 Unspecified severe protein-calorie malnutrition: Secondary | ICD-10-CM | POA: Diagnosis not present

## 2015-04-27 DIAGNOSIS — I2510 Atherosclerotic heart disease of native coronary artery without angina pectoris: Secondary | ICD-10-CM | POA: Diagnosis not present

## 2015-04-27 DIAGNOSIS — I480 Paroxysmal atrial fibrillation: Secondary | ICD-10-CM | POA: Diagnosis not present

## 2015-04-27 DIAGNOSIS — I10 Essential (primary) hypertension: Secondary | ICD-10-CM | POA: Diagnosis not present

## 2015-04-27 DIAGNOSIS — I5032 Chronic diastolic (congestive) heart failure: Secondary | ICD-10-CM | POA: Diagnosis not present

## 2015-04-27 DIAGNOSIS — I272 Other secondary pulmonary hypertension: Secondary | ICD-10-CM | POA: Diagnosis not present

## 2015-05-01 ENCOUNTER — Ambulatory Visit (INDEPENDENT_AMBULATORY_CARE_PROVIDER_SITE_OTHER): Payer: Medicare Other | Admitting: Family Medicine

## 2015-05-01 DIAGNOSIS — E538 Deficiency of other specified B group vitamins: Secondary | ICD-10-CM

## 2015-05-01 MED ORDER — CYANOCOBALAMIN 1000 MCG/ML IJ SOLN
1000.0000 ug | Freq: Once | INTRAMUSCULAR | Status: AC
  Administered 2015-05-01: 1000 ug via INTRAMUSCULAR

## 2015-05-02 DIAGNOSIS — I5032 Chronic diastolic (congestive) heart failure: Secondary | ICD-10-CM | POA: Diagnosis not present

## 2015-05-02 DIAGNOSIS — I10 Essential (primary) hypertension: Secondary | ICD-10-CM | POA: Diagnosis not present

## 2015-05-02 DIAGNOSIS — I2510 Atherosclerotic heart disease of native coronary artery without angina pectoris: Secondary | ICD-10-CM | POA: Diagnosis not present

## 2015-05-02 DIAGNOSIS — I272 Other secondary pulmonary hypertension: Secondary | ICD-10-CM | POA: Diagnosis not present

## 2015-05-02 DIAGNOSIS — I480 Paroxysmal atrial fibrillation: Secondary | ICD-10-CM | POA: Diagnosis not present

## 2015-05-02 DIAGNOSIS — E43 Unspecified severe protein-calorie malnutrition: Secondary | ICD-10-CM | POA: Diagnosis not present

## 2015-05-09 DIAGNOSIS — E43 Unspecified severe protein-calorie malnutrition: Secondary | ICD-10-CM | POA: Diagnosis not present

## 2015-05-11 DIAGNOSIS — I5032 Chronic diastolic (congestive) heart failure: Secondary | ICD-10-CM | POA: Diagnosis not present

## 2015-05-11 DIAGNOSIS — I272 Other secondary pulmonary hypertension: Secondary | ICD-10-CM | POA: Diagnosis not present

## 2015-05-11 DIAGNOSIS — I10 Essential (primary) hypertension: Secondary | ICD-10-CM | POA: Diagnosis not present

## 2015-05-11 DIAGNOSIS — I2510 Atherosclerotic heart disease of native coronary artery without angina pectoris: Secondary | ICD-10-CM | POA: Diagnosis not present

## 2015-05-11 DIAGNOSIS — E43 Unspecified severe protein-calorie malnutrition: Secondary | ICD-10-CM | POA: Diagnosis not present

## 2015-05-11 DIAGNOSIS — I480 Paroxysmal atrial fibrillation: Secondary | ICD-10-CM | POA: Diagnosis not present

## 2015-05-11 NOTE — Patient Outreach (Signed)
[facility]) Care Management  05/11/2015  [name] [birth year] [id number]   Referral from High Risk List, assigned Ellyn Hack, RN to outreach.  Thanks, [name]. [name] [facility] Care Management [facility] CM Assistant Phone: [phone] Fax: [phone]

## 2015-05-12 ENCOUNTER — Other Ambulatory Visit: Payer: Self-pay

## 2015-05-12 NOTE — Patient Outreach (Signed)
Unsuccessful attempt made to contact patient for community care coordination on telephone number [phone]. HIPPA compliant message left on voice mail with this RNCM's contact information and date of planned next contact attempt.  Plan: Next telephone contact on September 12.

## 2015-05-16 ENCOUNTER — Encounter: Payer: Self-pay | Admitting: Internal Medicine

## 2015-05-16 ENCOUNTER — Other Ambulatory Visit: Payer: Self-pay

## 2015-05-16 ENCOUNTER — Ambulatory Visit (INDEPENDENT_AMBULATORY_CARE_PROVIDER_SITE_OTHER): Payer: Medicare Other | Admitting: Internal Medicine

## 2015-05-16 VITALS — BP 150/70 | Temp 98.0°F | Ht 64.0 in | Wt 138.3 lb

## 2015-05-16 DIAGNOSIS — R54 Age-related physical debility: Secondary | ICD-10-CM | POA: Diagnosis not present

## 2015-05-16 DIAGNOSIS — E538 Deficiency of other specified B group vitamins: Secondary | ICD-10-CM | POA: Diagnosis not present

## 2015-05-16 DIAGNOSIS — I25810 Atherosclerosis of coronary artery bypass graft(s) without angina pectoris: Secondary | ICD-10-CM

## 2015-05-16 DIAGNOSIS — E039 Hypothyroidism, unspecified: Secondary | ICD-10-CM | POA: Diagnosis not present

## 2015-05-16 DIAGNOSIS — N183 Chronic kidney disease, stage 3 unspecified: Secondary | ICD-10-CM

## 2015-05-16 DIAGNOSIS — Z23 Encounter for immunization: Secondary | ICD-10-CM

## 2015-05-16 DIAGNOSIS — Z7409 Other reduced mobility: Secondary | ICD-10-CM

## 2015-05-16 DIAGNOSIS — T730XXA Starvation, initial encounter: Secondary | ICD-10-CM

## 2015-05-16 DIAGNOSIS — I5032 Chronic diastolic (congestive) heart failure: Secondary | ICD-10-CM | POA: Diagnosis not present

## 2015-05-16 DIAGNOSIS — E639 Nutritional deficiency, unspecified: Secondary | ICD-10-CM

## 2015-05-16 DIAGNOSIS — Z789 Other specified health status: Secondary | ICD-10-CM

## 2015-05-16 DIAGNOSIS — Z87898 Personal history of other specified conditions: Secondary | ICD-10-CM

## 2015-05-16 LAB — CBC WITH DIFFERENTIAL/PLATELET
BASOS ABS: 0 10*3/uL (ref 0.0–0.1)
Basophils Relative: 0.3 % (ref 0.0–3.0)
Eosinophils Absolute: 0.2 10*3/uL (ref 0.0–0.7)
Eosinophils Relative: 2.6 % (ref 0.0–5.0)
HEMATOCRIT: 34.3 % — AB (ref 36.0–46.0)
HEMOGLOBIN: 11.4 g/dL — AB (ref 12.0–15.0)
LYMPHS PCT: 13 % (ref 12.0–46.0)
Lymphs Abs: 0.8 10*3/uL (ref 0.7–4.0)
MCHC: 33.2 g/dL (ref 30.0–36.0)
MCV: 92.6 fl (ref 78.0–100.0)
MONOS PCT: 6.7 % (ref 3.0–12.0)
Monocytes Absolute: 0.4 10*3/uL (ref 0.1–1.0)
NEUTROS PCT: 77.4 % — AB (ref 43.0–77.0)
Neutro Abs: 5 10*3/uL (ref 1.4–7.7)
Platelets: 211 10*3/uL (ref 150.0–400.0)
RBC: 3.71 Mil/uL — AB (ref 3.87–5.11)
RDW: 14.7 % (ref 11.5–15.5)
WBC: 6.5 10*3/uL (ref 4.0–10.5)

## 2015-05-16 LAB — BASIC METABOLIC PANEL
BUN: 22 mg/dL (ref 6–23)
CO2: 23 meq/L (ref 19–32)
Calcium: 8.5 mg/dL (ref 8.4–10.5)
Chloride: 110 mEq/L (ref 96–112)
Creatinine, Ser: 1.4 mg/dL — ABNORMAL HIGH (ref 0.40–1.20)
GFR: 41.11 mL/min — AB (ref >60.00)
GLUCOSE: 71 mg/dL (ref 70–99)
POTASSIUM: 3.4 meq/L — AB (ref 3.5–5.1)
SODIUM: 142 meq/L (ref 135–145)

## 2015-05-16 LAB — TSH: TSH: 0.5 u[IU]/mL (ref 0.35–4.50)

## 2015-05-16 LAB — VITAMIN B12: Vitamin B-12: 846 pg/mL (ref 211–911)

## 2015-05-16 MED ORDER — ATORVASTATIN CALCIUM 20 MG PO TABS: 20.0000 mg | ORAL_TABLET | Freq: Every day | ORAL | Status: DC

## 2015-05-16 MED ORDER — CYANOCOBALAMIN 1000 MCG/ML IJ SOLN
1000.0000 ug | Freq: Once | INTRAMUSCULAR | Status: AC
  Administered 2015-05-16: 1000 ug via INTRAMUSCULAR

## 2015-05-16 NOTE — Telephone Encounter (Signed)
Loa Socks, LPN at 04/28/6014 10:37 AM     Status: Signed       Expand All Collapse All   Spoke with the pt to inform her that she is to be taking atorvastatin 20 mg po daily.  Informed the pt that she will only be taking one pill a day of her atorvastatin.  Pt states that she can swallow the atorvastatin 20 mg tabs with no difficulty. Pt verbalized understanding, agrees with this plan, and gracious for all the assistance provided.

## 2015-05-16 NOTE — Patient Instructions (Signed)
Will notify you  of labs when available. i think you are doing better  Continue with better nutrition    unless  Otherwise  Plan  Every 2 weeks  b12 for 6 weeks and then monthly.

## 2015-05-16 NOTE — Progress Notes (Signed)
Pre visit review using our clinic review tool, if applicable. No additional management support is needed unless otherwise documented below in the visit note.  Chief Complaint  Patient presents with  . Follow-up    HPI: [name] 59 y.o.  comes in for chronic disease/ medication management  Here with son today also.  She has seen dr Thayer Ohm for majhor depressive disorder  She has  compex med disease  b12 defiicncy  Malnutrition realted to personal issues and fininacial and living arrangements    Dr Donell Beers  feels stable onmed and inc wellbutrin  She has been eating better  Shopping together with son .  alsoi  Has had about 6 weeks of weekley b12 hsots   Better balance no falling  . Son thinks she is doing better .  Still problems with  Context of nutrition and  Place of living. Etc  No vomiting  No bleeding    ROS: See pertinent positives and negatives per HPI.  Past Medical History  Diagnosis Date  . CAD (coronary artery disease)     a.  cath 09/2010: LAD stent patent, S-Int/dCFX ok, S-PDA ok, L-LAD atretic;  b. Lexiscan Myoview (02/2014):  no ischemia, EF 55%; c. 11/2014 Cath/PCI: LM nl, LAD 20pISR, LCX 80-55m, OM1 nl, RI 70p, RCA 40-73m, RPDA 95ost/95-15m (PTCA only w/ reduction to 50p/69m), 60d, L->LAD atretic, VG->RI->OM nl, VG->PDA 100p.  [name] Hypertension   . Hx of CABG   . Hx of transesophageal echocardiography (TEE) for monitoring 11/2010    TEE 11/2010: EF 60-65%, BAE, trivial atrial septal shunt;  right heart cath in 10/2010 with elevated R and L heart pressures and diuretic started  . HLD (hyperlipidemia)   . Hypothyroidism   . COPD (chronic obstructive pulmonary disease)   . Pulmonary nodules     repeat CT due in 11/2011  . Acute right MCA stroke 11/07/10  . 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 Eczema   . Depression   . Atrial fibrillation     a. s/p TEE-DCCV 02/2104; b. Xarelto started  . Cardiomyopathy with EF 40% at TEE 02/17/14 (likely tachycardia  mediated - Myoview 02/19/14 neg for ischemia with normal EF) 02/18/2014  . 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 Sleep apnea     recent sleep study  in 04/2014 per chart review  shows no significant OSA  . Dysrhythmia 10/2014    atrial fib with rvr  . GERD (gastroesophageal reflux disease)   . Headache     Family History  Problem Relation Age of Onset  . COPD Mother   . Heart disease Mother   . Arthritis Mother     Rheumatoid and PMR  . Osteoporosis Mother     Mom fractured hip  . Diabetes type II Mother   . Depression Mother   . Heart attack Father   . Depression Father   . Hypertension Father   . Alcohol abuse Father   . Depression Sister   . Anxiety disorder Sister   . Drug abuse Sister   . Stroke Neg Hx     Social History   Social History  . Marital Status: Divorced    Spouse Name: N/A  . Number of Children: 2  . Years of Education: N/A   Occupational History  . DISABLE     Social History Main Topics  . Smoking status: Former  Smoker -- 0.25 packs/day for 30 years    Types: Cigarettes    Quit date: 09/10/2014  . Smokeless tobacco: Never Used  . Alcohol Use: No  . Drug Use: No  . Sexual Activity: Not Currently   Other Topics Concern  . None   Social History Narrative   Divorced   After stroke was living with sister and mother after sister asked her to leave. Mom has since deceased    Difficulties over control.    Then moved back in for about 6 weeks.   . tranportaion difficult son  helping   Ex-smoker   Was living at friends  Sister and her had argument and she was told to leave .    She was living in a homeless shelter for the last 3 weeks [facility]    son had help with transportation and medication       Work status regular  before got sick on short-term disability now lost t insurance after the stroke and couldn't work was  denied ssi    2 times.  She is not eligible for Medicaid because she doesn't have dependent  children.         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   Lives in  rented house 2 house mates males keep tp self has her own b room  Doing well     Outpatient Prescriptions Prior to Visit  Medication Sig Dispense Refill  . amiodarone (PACERONE) 200 MG tablet Take 1 tablet (200 mg total) by mouth daily. 30 tablet 4  . atorvastatin (LIPITOR) 20 MG tablet Take 1 tablet (20 mg total) by mouth daily. 90 tablet 0  . buPROPion (WELLBUTRIN XL) 150 MG 24 hr tablet 3 qam 90 tablet 5  . clopidogrel (PLAVIX) 75 MG tablet Take 1 tablet (75 mg total) by mouth daily with breakfast. 30 tablet 6  . levothyroxine (SYNTHROID, LEVOTHROID) 100 MCG tablet Take 1 tablet (100 mcg total) by mouth daily before breakfast. 30 tablet 0  . nitroGLYCERIN (NITROSTAT) 0.4 MG SL tablet Place 1 tablet (0.4 mg total) under the tongue every 5 (five) minutes as needed for chest pain. 90 tablet 3  . ondansetron (ZOFRAN-ODT) 4 MG disintegrating tablet DISSOLVE 1 TABLET ON THE TONGUE EVERY 8 HOURS AS NEEDED FOR NAUSEA 10 tablet 0  . potassium chloride SA (K-DUR,KLOR-CON) 20 MEQ tablet Take 1 tablet (20 mEq total) by mouth 3 (three) times daily. 90 tablet 6  . rivaroxaban (XARELTO) 20 MG TABS tablet Take 1 tablet (20 mg total) by mouth daily with supper. 30 tablet 3  . tamsulosin (FLOMAX) 0.4 MG CAPS capsule Take 1 capsule (0.4 mg total) by mouth daily. 30 capsule 0  . topiramate (TOPAMAX) 50 MG tablet TAKE 1 TABLET (50 MG TOTAL) BY MOUTH 2 (TWO) TIMES DAILY. 180 tablet 0  . traMADol (ULTRAM) 50 MG tablet TAKE 2 TABLETS BY MOUTH EVERY 6 HOURS AS NEEDED FOR MODERATE PAIN 20 tablet 0  . vitamin B-12 (CYANOCOBALAMIN) 1000 MCG tablet Take 1,000 mcg by mouth daily.    [name] atorvastatin (LIPITOR) 20 MG tablet Take 1 tablet (20  mg total) by mouth daily. 30 tablet 1  . levothyroxine (SYNTHROID, LEVOTHROID) 125 MCG tablet TAKE 1 TABLET (125 MCG  TOTAL) BY MOUTH DAILY.  11   No facility-administered medications prior to visit.     EXAM:  BP 150/70 mmHg  Temp(Src) 98 F (36.7 C) (Oral)  Ht 5\' 4"  (1.626 m)  Wt 138 lb 4.8 oz (62.732 kg)  BMI 23.73 kg/m2  Body mass index is 23.73 kg/(m^2).  GENERAL: vitals reviewed and listed above, alert, oriented, appears well hydrated and in no acute distress HEENT: atraumatic, conjunctiva  clear, no obvious abnormalities on inspection of external nose and ears NECK: no obvious masses on inspection palpation  LUNGS: clear to auscultation bilaterally, no wheezes, rales or rhonchi,  CV: HRRR, no clubbing cyanosis or  peripheral edema nl cap refill  MS: moves all extremities without noticeable focal  Abnormality  Walking much =more steadily  Uses cane as  Needed  Turns fairly quickly and up from chair in seconds   PSYCH: pleasant and cooperative, no obvious depression or anxiety Lab Results  Component Value Date   WBC 4.9 04/02/2015   HGB 9.4* 04/02/2015   HCT 28.0* 04/02/2015   PLT 226 04/02/2015   GLUCOSE 95 04/03/2015   CHOL 210* 03/14/2015   TRIG 82.0 03/14/2015   HDL 51.00 03/14/2015   LDLDIRECT 153.0 01/15/2010   LDLCALC 143* 03/14/2015   ALT 13* 04/03/2015   AST 15 04/03/2015   NA 139 04/03/2015   K 3.6 04/03/2015   CL 115* 04/03/2015   CREATININE 1.04* 04/03/2015   BUN 11 04/03/2015   CO2 21* 04/03/2015   TSH 0.015* 03/31/2015   INR 1.2* 11/08/2014   HGBA1C  02/11/2011    5.4 (NOTE)                                                                       According to the ADA Clinical Practice Recommendations for 2011, when HbA1c is used as a screening test:   >=6.5%   Diagnostic of Diabetes Mellitus           (if abnormal result  is confirmed)  5.7-6.4%   Increased risk of developing Diabetes Mellitus  References:Diagnosis and Classification of Diabetes  [place],Diabetes Care,2011,34(Suppl 1):S62-S69 and Standards of Medical Care in         Diabetes - 2011,Diabetes Care,2011,34  (Suppl 1):S11-S61.    ASSESSMENT AND PLAN:  Discussed the following assessment and plan:  B12 deficiency - in weekley injection change ot q 2 weeks  if lab ok  - Plan: CBC with Differential/Platelet, TSH, Vitamin B12, Basic metabolic panel, cyanocobalamin ((VITAMIN B-12)) injection 1,000 mcg  Hypothyroidism, unspecified hypothyroidism type - not inrange time for recheck - Plan: CBC with Differential/Platelet, TSH, Vitamin B12, Basic metabolic panel  Chronic diastolic heart failure - fu with cards  - Plan: CBC with Differential/Platelet, TSH, Vitamin B12, Basic metabolic panel  Frailty - improved weight and nutrition - Plan: CBC with Differential/Platelet, TSH, Vitamin B12, Basic metabolic panel  Mobility impaired - improved  - Plan: CBC with Differential/Platelet, TSH, Vitamin B12, Basic metabolic panel  Nutrition deficiency due to insufficient food - improved   CKD (chronic kidney disease), stage III - Plan: CBC with Differential/Platelet, TSH, Vitamin B12, Basic metabolic panel  Need for prophylactic vaccination and inoculation against influenza - Plan: Flu Vaccine QUAD 36+ mos PF IM (  Fluarix & Fluzone Quad PF)  Medically complex patient  Need reading glasses all the time  [facility] for walking  Out reach  unable to contact   Doing better with nutrition but still problems  Looks so much  better   prob related to nutrition and b12 replensishment balance is a lot better alos  Uses cane for confidence .  Repeat bp when not in office   Up today   Has been low in past . Has  appt with cards in next few weeks  Wt Readings from Last 3 Encounters:  05/16/15 138 lb 4.8 oz (62.732 kg)  04/10/15 129 lb 14.4 oz (58.922 kg)  04/03/15 134 lb 14.7 oz (61.2 kg)   Total visit > 50% spent counseling and coordinating care as indicated in above note and in  instructions to patient .   -Patient advised to return or notify health care team  if symptoms worsen ,persist or new concerns arise.  Patient Instructions  Will notify you  of labs when available. i think you are doing better  Continue with better nutrition    unless  Otherwise  Plan  Every 2 weeks  b12 for 6 weeks and then monthly.     Neta Mends. Panosh M.D.

## 2015-05-19 ENCOUNTER — Other Ambulatory Visit: Payer: Self-pay

## 2015-05-22 ENCOUNTER — Other Ambulatory Visit: Payer: Self-pay | Admitting: Family Medicine

## 2015-05-22 ENCOUNTER — Other Ambulatory Visit: Payer: Self-pay

## 2015-05-22 DIAGNOSIS — E876 Hypokalemia: Secondary | ICD-10-CM

## 2015-05-22 DIAGNOSIS — F331 Major depressive disorder, recurrent, moderate: Secondary | ICD-10-CM

## 2015-05-22 NOTE — Patient Outreach (Signed)
[facility]) Care Management  05/22/2015  [name] 08-06-1956 [id number]   Request from Emilia Beck, RN to assign SW, assigned [employer], LCSW.  Thanks, [name]. [name] [facility] Care Management [facility] CM Assistant Phone: [phone] Fax: [phone]

## 2015-05-22 NOTE — Patient Outreach (Signed)
This RNCM assessed patient via telephone for community care coordination needs. Patent identified herself using HIPPA identifier by providing date of birth and address.     patient was agreeable to community care coordination assessment. Patient acknowledges she is having financial difficulty and is dependent on her son to assist her with ADLS. Patient states her son is struggling financially, would for both to live in the same assisted living. This RNCM advised patient that since her son is 54 years old, he may not qualify to live in an assisted living as the age limit to most is 75 and patient admits her son is 57 years old.  The following were identified by patient as barriers to chronic disease management:  ADL/IADL deficit: patient states she rely on her son to assist her at times patient denies she does not have Medicaid.    Fall Safety: Home visit scheduled for next week to assess home safety

## 2015-05-24 ENCOUNTER — Other Ambulatory Visit: Payer: Self-pay | Admitting: Neurology

## 2015-05-24 ENCOUNTER — Other Ambulatory Visit: Payer: Self-pay

## 2015-05-25 ENCOUNTER — Encounter: Payer: Self-pay | Admitting: *Deleted

## 2015-05-25 ENCOUNTER — Other Ambulatory Visit: Payer: Self-pay

## 2015-05-25 ENCOUNTER — Other Ambulatory Visit: Payer: Self-pay | Admitting: *Deleted

## 2015-05-25 MED ORDER — TRAMADOL HCL 50 MG PO TABS: ORAL_TABLET | ORAL | Status: DC

## 2015-05-25 NOTE — Patient Outreach (Signed)
[facility]) Care Management  05/25/2015  [name] [birth year] [id number]   CSW received a new referral on patient from Emilia Beck, [facility] with [facility] Care Management, reporting that patient would benefit from social work services and resources to assist with the following services:   Completion of Adult and Long-Term Care [employer] Assistance with obtaining transportation to and from physician appointments Assistance with Assisted Living Placement for long-term care Counseling and supportive services for positive depression screening Mrs. [name] requested that CSW contact patient to provide a referral for services to all of the above named problems.  CSW made an initial attempt to try and contact patient today to perform phone assessment, as well as assess and assist with social work needs and services; however, patient was unavailable.  A HIPAA compliant message was left on voicemail.  CSW is currently awaiting a return call.  CSW will prescribe and print EMMI information for patient to review at the initial home visit.  CSW will fax a barriers letter to patient's Primary Care Physician, Dr. Berniece Andreas.  Danford Bad, BSW, MSW, LCSW  Licensed Restaurant manager, [employer] [facility]  Mailing [place] [address] [place], [place] [address] Physical Address-[address], [place], [place] [address] Toll Free Main # [phone] Fax # [phone] Cell # [phone]  Fax # [phone]  [email or web address]

## 2015-05-26 ENCOUNTER — Telehealth: Payer: Self-pay | Admitting: *Deleted

## 2015-05-26 ENCOUNTER — Ambulatory Visit (HOSPITAL_COMMUNITY): Payer: Self-pay | Admitting: Psychiatry

## 2015-05-26 NOTE — Telephone Encounter (Signed)
Spoke with patient and informed her refill called in to [facility], [employer]. She verbalized understanding, appreciation.

## 2015-05-30 ENCOUNTER — Ambulatory Visit (INDEPENDENT_AMBULATORY_CARE_PROVIDER_SITE_OTHER): Payer: Medicare Other | Admitting: Family Medicine

## 2015-05-30 DIAGNOSIS — E538 Deficiency of other specified B group vitamins: Secondary | ICD-10-CM | POA: Diagnosis not present

## 2015-05-30 MED ORDER — CYANOCOBALAMIN 1000 MCG/ML IJ SOLN
1000.0000 ug | Freq: Once | INTRAMUSCULAR | Status: AC
  Administered 2015-05-30: 1000 ug via INTRAMUSCULAR

## 2015-05-30 MED ORDER — LEVOTHYROXINE SODIUM 100 MCG PO TABS: 100.0000 ug | ORAL_TABLET | Freq: Every day | ORAL | Status: DC

## 2015-05-31 ENCOUNTER — Encounter: Payer: Self-pay | Admitting: Cardiology

## 2015-05-31 ENCOUNTER — Ambulatory Visit (INDEPENDENT_AMBULATORY_CARE_PROVIDER_SITE_OTHER): Payer: Medicare Other | Admitting: Cardiology

## 2015-05-31 VITALS — BP 130/78 | HR 52 | Ht 63.0 in | Wt 144.8 lb

## 2015-05-31 DIAGNOSIS — I25810 Atherosclerosis of coronary artery bypass graft(s) without angina pectoris: Secondary | ICD-10-CM | POA: Diagnosis not present

## 2015-05-31 DIAGNOSIS — E032 Hypothyroidism due to medicaments and other exogenous substances: Secondary | ICD-10-CM

## 2015-05-31 DIAGNOSIS — E785 Hyperlipidemia, unspecified: Secondary | ICD-10-CM | POA: Diagnosis not present

## 2015-05-31 DIAGNOSIS — I5032 Chronic diastolic (congestive) heart failure: Secondary | ICD-10-CM | POA: Diagnosis not present

## 2015-05-31 DIAGNOSIS — I25709 Atherosclerosis of coronary artery bypass graft(s), unspecified, with unspecified angina pectoris: Secondary | ICD-10-CM | POA: Diagnosis not present

## 2015-05-31 DIAGNOSIS — I4891 Unspecified atrial fibrillation: Secondary | ICD-10-CM

## 2015-05-31 DIAGNOSIS — E876 Hypokalemia: Secondary | ICD-10-CM

## 2015-05-31 LAB — CBC WITH DIFFERENTIAL/PLATELET
Basophils Absolute: 0 10*3/uL (ref 0.0–0.1)
Basophils Relative: 0.4 % (ref 0.0–3.0)
Eosinophils Absolute: 0.1 10*3/uL (ref 0.0–0.7)
Eosinophils Relative: 2.3 % (ref 0.0–5.0)
HCT: 34.6 % — ABNORMAL LOW (ref 36.0–46.0)
Hemoglobin: 11.4 g/dL — ABNORMAL LOW (ref 12.0–15.0)
Lymphocytes Relative: 15.9 % (ref 12.0–46.0)
Lymphs Abs: 1 10*3/uL (ref 0.7–4.0)
MCHC: 32.8 g/dL (ref 30.0–36.0)
MCV: 94.1 fl (ref 78.0–100.0)
Monocytes Absolute: 0.4 10*3/uL (ref 0.1–1.0)
Monocytes Relative: 7.4 % (ref 3.0–12.0)
Neutro Abs: 4.5 10*3/uL (ref 1.4–7.7)
Neutrophils Relative %: 74 % (ref 43.0–77.0)
Platelets: 192 10*3/uL (ref 150.0–400.0)
RBC: 3.68 Mil/uL — ABNORMAL LOW (ref 3.87–5.11)
RDW: 15.3 % (ref 11.5–15.5)
WBC: 6.1 10*3/uL (ref 4.0–10.5)

## 2015-05-31 LAB — COMPREHENSIVE METABOLIC PANEL
ALT: 10 U/L (ref 0–35)
AST: 13 U/L (ref 0–37)
Albumin: 3.8 g/dL (ref 3.5–5.2)
Alkaline Phosphatase: 65 U/L (ref 39–117)
BUN: 21 mg/dL (ref 6–23)
CO2: 27 mEq/L (ref 19–32)
Calcium: 8.8 mg/dL (ref 8.4–10.5)
Chloride: 111 mEq/L (ref 96–112)
Creatinine, Ser: 1.01 mg/dL (ref 0.40–1.20)
GFR: 59.91 mL/min — ABNORMAL LOW (ref >60.00)
Glucose, Bld: 80 mg/dL (ref 70–99)
Potassium: 4.2 mEq/L (ref 3.5–5.1)
Sodium: 141 mEq/L (ref 135–145)
Total Bilirubin: 0.3 mg/dL (ref 0.2–1.2)
Total Protein: 6.7 g/dL (ref 6.0–8.3)

## 2015-05-31 NOTE — Progress Notes (Signed)
Patient ID: [name], female   DOB: 1956-06-26, 59 y.o.   MRN: [id number] Patient ID: [name], female   DOB: 1956-07-29, 59 y.o.   MRN: [id number]    Cardiology Office Note   Date:  05/31/2015   ID:  [name], DOB [birth year], MRN [id number]  PCP:  [name], MD  Cardiologist:  Dr. Tobias Alexander     No chief complaint on file.    History of Present Illness: [name] is a 59 y.o. female with a hx of CAD s/p CABG and subsequent stenting of the native LAD (LIMA-LAD found to be atretic), PAF (on amiodarone and Xarelto), chronic combined systolic and diastolic HF (EF 08% in 2015 >> improved to 65-70% in 3/16), HTN, HL, CKD, OSA, CVA in 2012, COPD, bradycardia.    Evaluated by Dr. Tobias Alexander 11/2014 for progressively worsening DOE.  R/L heart cath was arranged.  She was admitted 3/3-3/4. Cardiac catheterization demonstrated patent LAD stent, SVG-RI/OM patent, SVG-PDA occluded (new finding), severe distal PDA disease treated with balloon angioplasty only (This was suboptimal as balloon was not fully expanded).  Because she is on chronic xarelto therapy, she was discharged on aspirin, Plavix, and xarelto. She will need to discontinue aspirin after one month and continue Plavix and xarelto after that time.  Readmitted 4/2-4/6 with atrial fibrillation with RVR. She had run out of amiodarone 4 days prior to admission. She was hypokalemic with a potassium less than 3. She was placed on IV amiodarone. Potassium was replaced. Patient returned to NSR and she was transitioned back to oral amiodarone. She was placed on spironolactone to help with achieving normal potassium levels. Hospitalization was complicated by hypotension as well as worsening renal insufficiency felt to be related to hypoperfusion from AF with RVR.  Troponins were elevated. As the patient had incomplete dilation of the PDA during previous angioplasty, this was not felt to be a surprise in the setting of  atrial fibrillation with RVR. This was felt to be demand ischemia. Medical therapy was continued. Aspirin was stopped and she was >30 days post PCI.    05/31/15 - 5 months follow up, she feels significantly better, she has no CP and denies SOB, also her dizziness and orthostatic hypotension has improved. She is able to walk with a cane. She was admitted in July with hypothyroidism, hypokalemia and hypomagnesemia. Synthroid was increased then. On 05/16/2015 TSH was 0.5, but she was off synthroid for a while.  Recently she was not able to use her kitchen and developed acute renal insufficiency. Now eating/drinking well. Denies orthopnea, PND. No bleeding. No muscle pain.  Studies/Reports Reviewed Today:  LHC/PCI 11/10/14 LM: ok LAD: stent in the proximal segment that is roughly 20% in-stent restenosis. RI: 70% proximal disease.  LCx:  80-90% stenosis in the mid AV groove segment RCA: prox 40%, mid 40-50%, RPDA ostial 95% then 95-99% then 60% L-LAD:  Atretic (old) S-RI/OM:  Patent  S-RPDA:  prox 100% PCI:  Suboptimal PTCA only of the ostial RPDA and mid RPDA leading to the anastomosis site. Unable to fully expand the balloon.  Echo 11/08/14 - Mild focal basal hypertrophy of the septum. LVF vigorous. EF 65% to 70%.  HK basalinferior myocardium. Grade 1 diastolic dysfunction. - Aortic valve: There was trivial regurgitation. - Left atrium: The atrium was mildly dilated. Impressions: Compared to the prior study, there has been no significant interval change.  Myoview 02/19/14 IMPRESSION: No evidence for reversibility or myocardial ischemia.  Calculated  ejection fraction is 55%.  Carotid US 5/15 No sig ICA stenosis   Past Medical History  Diagnosis Date  . CAD (coronary artery disease)     a.  cath 09/2010: LAD stent patent, S-Int/dCFX ok, S-PDA ok, L-LAD atretic;  b. Lexiscan Myoview (02/2014):  no ischemia, EF 55%; c. 11/2014 Cath/PCI: LM nl, LAD 20pISR, LCX 80-17m, OM1 nl, RI 70p, RCA 40-19m, RPDA  95ost/95-72m (PTCA only w/ reduction to 50p/25m), 60d, L->LAD atretic, VG->RI->OM nl, VG->PDA 100p.  [name] Hypertension   . Hx of CABG   . Hx of transesophageal echocardiography (TEE) for monitoring 11/2010    TEE 11/2010: EF 60-65%, BAE, trivial atrial septal shunt;  right heart cath in 10/2010 with elevated R and L heart pressures and diuretic started  . HLD (hyperlipidemia)   . Hypothyroidism   . COPD (chronic obstructive pulmonary disease)   . Pulmonary nodules     repeat CT due in 11/2011  . Acute right MCA stroke 11/07/10  . 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 Eczema   . Depression   . Atrial fibrillation     a. s/p TEE-DCCV 02/2104; b. Xarelto started  . Cardiomyopathy with EF 40% at TEE 02/17/14 (likely tachycardia mediated - Myoview 02/19/14 neg for ischemia with normal EF) 02/18/2014  . 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 Sleep apnea     recent sleep study  in 04/2014 per chart review  shows no significant OSA  . Dysrhythmia 10/2014    atrial fib with rvr  . GERD (gastroesophageal reflux disease)   . Headache     Past Surgical History  Procedure Laterality Date  . Coronary artery bypass graft    . Debriment for infection in chest    . Chest wall reconstruction    . Bilateral knee surgery    . Bladder surgery    . Left mastoidectomy    . Hernia repair    . Cath 2012    . Tee without cardioversion N/A 02/17/2014    Procedure: TRANSESOPHAGEAL ECHOCARDIOGRAM (TEE);  Surgeon: Pricilla Riffle, MD;  Location: [facility] ENDOSCOPY;  Service: Cardiovascular;  Laterality: N/A;  . Cardioversion N/A 02/17/2014    Procedure: CARDIOVERSION;  Surgeon: Pricilla Riffle, MD;  Location: [facility] ENDOSCOPY;  Service: Cardiovascular;  Laterality: N/A;  . Left and right heart catheterization with coronary angiogram N/A 11/10/2014    Procedure: LEFT AND RIGHT HEART CATHETERIZATION WITH CORONARY ANGIOGRAM;  Surgeon: Marykay Lex, MD;  Location: [facility] CATH LAB;  Service:  Cardiovascular;  Laterality: N/A;     Current Outpatient Prescriptions  Medication Sig Dispense Refill  . amiodarone (PACERONE) 200 MG tablet Take 1 tablet (200 mg total) by mouth daily. 30 tablet 4  . atorvastatin (LIPITOR) 20 MG tablet Take 1 tablet (20 mg total) by mouth daily. 90 tablet 0  . buPROPion (WELLBUTRIN XL) 150 MG 24 hr tablet Take 150 mg by mouth 3 (three) times daily.    . clopidogrel (PLAVIX) 75 MG tablet Take 1 tablet (75 mg total) by mouth daily with breakfast. 30 tablet 6  . Cyanocobalamin (B-12 COMPLIANCE INJECTION IJ) Inject as directed. Pt gets injection every other week    . levothyroxine (SYNTHROID, LEVOTHROID) 100 MCG tablet Take 1 tablet (100 mcg total) by mouth daily before breakfast. 30 tablet 2  . nitroGLYCERIN (NITROSTAT) 0.4 MG SL tablet Place 1 tablet (0.4 mg total) under  the tongue every 5 (five) minutes as needed for chest pain. 90 tablet 3  . ondansetron (ZOFRAN-ODT) 4 MG disintegrating tablet DISSOLVE 1 TABLET ON THE TONGUE EVERY 8 HOURS AS NEEDED FOR NAUSEA 10 tablet 0  . potassium chloride SA (K-DUR,KLOR-CON) 20 MEQ tablet Take 1 tablet (20 mEq total) by mouth 3 (three) times daily. 90 tablet 6  . rivaroxaban (XARELTO) 20 MG TABS tablet Take 1 tablet (20 mg total) by mouth daily with supper. 30 tablet 3  . tamsulosin (FLOMAX) 0.4 MG CAPS capsule Take 1 capsule (0.4 mg total) by mouth daily. 30 capsule 0  . topiramate (TOPAMAX) 50 MG tablet TAKE 1 TABLET (50 MG TOTAL) BY MOUTH 2 (TWO) TIMES DAILY. 180 tablet 0  . traMADol (ULTRAM) 50 MG tablet TAKE 2 TABLETS BY MOUTH EVERY 6 HOURS AS NEEDED FOR MODERATE PAIN 30 tablet 5  . vitamin B-12 (CYANOCOBALAMIN) 1000 MCG tablet Take 1,000 mcg by mouth daily.     No current facility-administered medications for this visit.    Allergies:   Avelox; Amoxicillin; Hydrocodone; Oxycodone; Penicillins; and Sulfa antibiotics    Social History:  The patient  reports that she quit smoking about 8 months ago. Her smoking  use included Cigarettes. She has a 7.5 pack-year smoking history. She has never used smokeless tobacco. She reports that she does not drink alcohol or use illicit drugs.   Family History:  The patient's family history includes Alcohol abuse in her father; Anxiety disorder in her sister; Arthritis in her mother; COPD in her mother; Depression in her father, mother, and sister; Diabetes type II in her mother; Drug abuse in her sister; Heart attack in her father; Heart disease in her mother; Hypertension in her father; Osteoporosis in her mother. There is no history of Stroke.    ROS:   Please see the history of present illness.   Review of Systems  Hematologic/Lymphatic: Bruises/bleeds easily.  Gastrointestinal: Positive for constipation.  Genitourinary: Positive for urgency.  Psychiatric/Behavioral: Positive for depression.  All other systems reviewed and are negative.    PHYSICAL EXAM: VS:  BP 130/78 mmHg  Pulse 52  Ht 5\' 3"  (1.6 m)  Wt 144 lb 12.8 oz (65.681 kg)  BMI 25.66 kg/m2  SpO2 97%    No data found.    Wt Readings from Last 3 Encounters:  05/31/15 144 lb 12.8 oz (65.681 kg)  05/16/15 138 lb 4.8 oz (62.732 kg)  04/10/15 129 lb 14.4 oz (58.922 kg)     GEN: Well nourished, well developed, in no acute distress HEENT: normal Neck: no JVD,  no masses Cardiac:  Normal S1/S2, RRR; no murmur ,  no rubs or gallops, no edema  R groin without hematoma or bruit  Respiratory:  clear to auscultation bilaterally, no wheezing, rhonchi or rales. GI: soft, nontender, nondistended, + BS MS: no deformity or atrophy Skin: warm and dry  Neuro:  CNs II-XII intact, Strength and sensation are intact Psych: Normal affect   EKG:  EKG is ordered today.  It demonstrates:   NSR, HR 78, normal axis, inferolateral T-wave inversions, no significant change compared to prior tracing QTc 421   Recent Labs: 11/08/2014: Pro B Natriuretic peptide (BNP) 89.0 11/17/2014: B Natriuretic Peptide  69.0 04/03/2015: ALT 13*; Magnesium 2.0 05/16/2015: BUN 22; Creatinine, Ser 1.40*; Hemoglobin 11.4*; Platelets 211.0; Potassium 3.4*; Sodium 142; TSH 0.50    Lipid Panel    Component Value Date/Time   CHOL 210* 03/14/2015 1159   TRIG 82.0 03/14/2015 1159  HDL 51.00 03/14/2015 1159   CHOLHDL 4 03/14/2015 1159   VLDL 16.4 03/14/2015 1159   LDLCALC 143* 03/14/2015 1159   LDLDIRECT 153.0 01/15/2010 1501   TTE 11/2014 Left ventricle: The cavity size was normal. There was mild focal basal hypertrophy of the septum. Systolic function was vigorous. The estimated ejection fraction was in the range of 65% to 70%. There is hypokinesis of the basalinferior myocardium. Doppler parameters are consistent with abnormal left ventricular relaxation (grade 1 diastolic dysfunction). - Aortic valve: There was trivial regurgitation. - Left atrium: The atrium was mildly dilated.  Impressions:  - Compared to the prior study, there has been no significant interval change.    ASSESSMENT AND PLAN:  Coronary artery disease involving autologous vein coronary bypass graft without angina pectoris Improved symptoms. ECG shows unchanged negative T waves in the anterolateral and inferior leads.   -  Continue Plavix in addition to Xarelto, statin.  ASA was stopped 30 days post PCI.  -  Consider adding ACE inhibitor back over time.  - unable to add betablocker as she is bradycardic  Orthostatic hypotension - resolved after holding diuretics  PAF (paroxysmal atrial fibrillation) Maintaining NSR. She remains on Amiodarone and Xarelto. We will go back to 20 mg of Xarelto. Chronic diastolic heart failure She is euvolemic off diuretics, I would continue holding as she just recovered from acute kidney failure  Essential hypertension Controlled.  Hyperlipidemia Continue atorvastatin 20 mg po daily. Check LFTs today.  CKD (chronic kidney disease), unspecified stage Recheck today with  electrolytes.   Depression She admits to remaining in her room "all the time."  She does not feel like coming out.  She has some issues with balance probably related to low BP.  However, I have asked her to FU with psych sooner if her depressive symptoms worsen.    Hypothyrodism - recheck at the next visit.  Follow up in 6 months.   Lars Masson, MD 05/31/2015

## 2015-05-31 NOTE — Patient Instructions (Signed)
Medication Instructions:   Your physician recommends that you continue on your current medications as directed. Please refer to the Current Medication list given to you today.    Labwork:  TODAY---CMET AND CBC W DIFF      Follow-Up:  Your physician wants you to follow-up in: 6 MONTHS WITH DR NELSON You will receive a reminder letter in the mail two months in advance. If you don't receive a letter, please call our office to schedule the follow-up appointment.      

## 2015-06-02 ENCOUNTER — Telehealth (HOSPITAL_COMMUNITY): Payer: Self-pay

## 2015-06-02 NOTE — Telephone Encounter (Signed)
Patient called with report of increased problems with visual hallucinations.  States she is seeing spiders again and thinks may need to be seen earlier.  Patient denies any suicidal or homicidal ideations and reports she is safe but thinks needs to return before November to see Dr. Donell Beers.  Patient now scheduled for Wednesday 06/14/15 at 4:30pm and agrees with appointment time.  Patient to call back if anything worsens or begins to have thoughts of wanting to harm self or others.  States she feels she will be fine until she sees Dr. Donell Beers on 06/14/15.

## 2015-06-05 ENCOUNTER — Other Ambulatory Visit: Payer: Self-pay | Admitting: *Deleted

## 2015-06-06 NOTE — Patient Outreach (Signed)
[facility]) Care Management  06/06/2015  [name] 06/25/1956 [id number]   CSW was able to make brief contact with patient today to confirm that patient received the packet of information that CSW mailed to patient's home on 09/15.  The packet of resource information included all of the following: Adult and Long-Term Care Medicaid Applications with instructions for completion Transportation resources to and from physician appointments Assisted Living Placement options for long-term care placement arrangements Counseling and supportive services for positive depression screening CSW was able to obtain three HIPAA compliant identifiers from patient, which included her name, date of birth and home address.  CSW explained the reason for the call, indicating that CSW works with patient's RNCM with [facility] Care Management, Emilia Beck.  Patient reported, I understand, but now is really not a good time".  Patient went on to say, "Can I call you sometime next week, my phone is about to run out of minutes"?, confirming CSW's contact information before abruptly terminating the call.  CSW will await a return call from patient, before making an additional outreach attempt within the next 7-10 days.  Danford Bad, BSW, MSW, LCSW  Licensed Restaurant manager, [employer] [facility]  Mailing [place] [address], [place], [place] [address] Physical Address-[address], [place], [place] [address] Toll Free Main # [phone] Fax # [phone] Cell # [phone]  Fax # [phone]  [email or web address]

## 2015-06-08 ENCOUNTER — Other Ambulatory Visit: Payer: Self-pay

## 2015-06-08 NOTE — Patient Outreach (Signed)
[facility]) Care Management  06/08/2015  [name] August 14, 1956 [id number]   Arrived at patient's home for this initial home visit. Unsuccessful in meeting with patient as she did not come to door when bell ranged nor did  She answer the call.   HIPPA compliant message left for patient with this RNCM contact information.

## 2015-06-08 NOTE — Patient Outreach (Signed)
[facility]) Care Management  06/08/2015  LAURI WALTENBAUGH [birth year] [id number]   This patient arrived for this initial home visit to assess for community care coordination. Patient did not come to the door nor did she answer the telephone.  HIPPA compliant message left on patient's telephone.  This RNCM left his business card with this RNCM's contact information.  Plan: Make an attempt to contact patient on 9/30

## 2015-06-13 ENCOUNTER — Other Ambulatory Visit: Payer: Self-pay

## 2015-06-13 ENCOUNTER — Ambulatory Visit (INDEPENDENT_AMBULATORY_CARE_PROVIDER_SITE_OTHER): Payer: Medicare Other | Admitting: Family Medicine

## 2015-06-13 DIAGNOSIS — E538 Deficiency of other specified B group vitamins: Secondary | ICD-10-CM | POA: Diagnosis not present

## 2015-06-13 MED ORDER — CYANOCOBALAMIN 1000 MCG/ML IJ SOLN
1000.0000 ug | Freq: Once | INTRAMUSCULAR | Status: AC
  Administered 2015-06-13: 1000 ug via INTRAMUSCULAR

## 2015-06-13 NOTE — Patient Outreach (Signed)
Successful telephone contact made with patient who identified herself using HIPPA identiifers. Patient stated she was on her way home from a doctor's appointment at this current time. Patient and this RNCM agreed to make telephone contact on Friday, October 7 to reschedule home visit.

## 2015-06-14 ENCOUNTER — Ambulatory Visit (HOSPITAL_COMMUNITY): Payer: Self-pay | Admitting: Psychiatry

## 2015-06-16 ENCOUNTER — Other Ambulatory Visit: Payer: Self-pay | Admitting: *Deleted

## 2015-06-16 ENCOUNTER — Ambulatory Visit: Payer: Self-pay | Admitting: *Deleted

## 2015-06-16 NOTE — Patient Outreach (Signed)
[facility]) Care Management  06/16/2015  [name] [birth year] [id number]  CSW made an attempt to try and contact patient today to perform phone assessment, as well as assess and assist with social work needs and services, without success.  A HIPAA compliant message was left for patient on voicemail.  CSW is currently awaiting a return call.  Danford Bad, BSW, MSW, LCSW  Licensed Restaurant manager, [employer] food [facility]  Mailing [place] [address], [place], [place] [address] Physical Address-[address], [place], [place] [address] Toll Free Main # [phone] Fax # [phone] Cell # [phone]  Fax # [phone]  [email or web address]

## 2015-06-19 ENCOUNTER — Other Ambulatory Visit: Payer: Self-pay | Admitting: Nurse Practitioner

## 2015-06-20 ENCOUNTER — Other Ambulatory Visit: Payer: Self-pay | Admitting: *Deleted

## 2015-06-20 NOTE — Patient Outreach (Signed)
[facility]) Care Management  06/20/2015  [name] 1956-07-19 [id number]   CSW made a fourth and final attempt to try and contact patient today to perform phone assessment, as well as assess and assist with social work needs and services, without success.  A HIPAA compliant message was left for patient on voicemail.  CSW  continues to await a return call.  CSW will mail an outreach letter to patient's home, encouraging patient to contact CSW at their earliest convenience, if patient is interested in receiving social work services through CSW with [facility].  If CSW does not receive a return call from patient within the next 10 business days, CSW will proceed with case closure.  Required number of phone attempts will have been made and outreach letter mailed.   Danford Bad, BSW, MSW, LCSW  Licensed Restaurant manager, fast food [facility]  Mailing [place] [address], [place], [place] [address] Physical Address-[address], [place], [place] [address] Toll Free Main # [phone] Fax # [phone] Cell # [phone]  Fax # [phone]  [email or web address]

## 2015-06-22 ENCOUNTER — Ambulatory Visit (HOSPITAL_COMMUNITY): Payer: Self-pay | Admitting: Psychiatry

## 2015-06-26 ENCOUNTER — Ambulatory Visit: Payer: Self-pay | Admitting: Internal Medicine

## 2015-06-27 ENCOUNTER — Ambulatory Visit (INDEPENDENT_AMBULATORY_CARE_PROVIDER_SITE_OTHER): Payer: Medicare Other | Admitting: Family Medicine

## 2015-06-27 DIAGNOSIS — E538 Deficiency of other specified B group vitamins: Secondary | ICD-10-CM | POA: Diagnosis not present

## 2015-06-27 MED ORDER — CYANOCOBALAMIN 1000 MCG/ML IJ SOLN
1000.0000 ug | Freq: Once | INTRAMUSCULAR | Status: AC
  Administered 2015-06-27: 1000 ug via INTRAMUSCULAR

## 2015-06-28 ENCOUNTER — Encounter (HOSPITAL_COMMUNITY): Payer: Self-pay | Admitting: Psychiatry

## 2015-06-28 ENCOUNTER — Ambulatory Visit (INDEPENDENT_AMBULATORY_CARE_PROVIDER_SITE_OTHER): Payer: Medicare Other | Admitting: Psychiatry

## 2015-06-28 VITALS — BP 134/86 | HR 56 | Ht 64.0 in | Wt 144.2 lb

## 2015-06-28 DIAGNOSIS — I25810 Atherosclerosis of coronary artery bypass graft(s) without angina pectoris: Secondary | ICD-10-CM | POA: Diagnosis not present

## 2015-06-28 DIAGNOSIS — F331 Major depressive disorder, recurrent, moderate: Secondary | ICD-10-CM

## 2015-06-28 MED ORDER — BUPROPION HCL ER (XL) 150 MG PO TB24: 150.0000 mg | ORAL_TABLET | Freq: Three times a day (TID) | ORAL | Status: DC

## 2015-06-28 NOTE — Progress Notes (Signed)
[facility] MD Progress Note  06/28/2015 4:55 PM [name]  MRN:  [id number] Subjective:  Fairly good Principal Problem: Major depression, recurrent mild Diagnosis:  Major depression, mild At this a.m. the patient is doing fairly well. She denies daily depression. She does describe what she calls visual hallucinations where she sees networks expire wens around her. This seems to come and go. Patient says she recently had her potassium measured and was normal. The patient denies problems with sleep fact she says she sleeps a bit too much during the day. I think that might be related to the fact that she takes a high dose of tramadol 300 mg a day. I suspect she might be taking more than that. She says she takes for headaches. Is given to her by her primary care doctor. She says she has sinus headaches. Is my believe that maybe she is on too much medication at this time. She takes Wellbutrin regularly at 450 mg which is not causing her problem. She's eating very well has fairly good energy and can concentrate without a problem. The patient denies being suicidal. She denies any psychotic symptoms other than this sense of visual hallucinations. She denies auditory hallucinations and denies paranoia. The patient looks very well seems happy. Patient looking forward to moving. Today reviewed the pros and cons of her medications and she agreed to continue taking 1 dose on giving her Wellbutrin 450. We reviewed all her medications and I do not believe there is any interaction between and were between Wellbutrin. Patient Active Problem List   Diagnosis Date Noted  . Protein-calorie malnutrition, severe (HCC) [E43] 04/03/2015  . Hypomagnesemia [E83.42] 04/01/2015  . Chronic diastolic CHF (congestive heart failure), NYHA class 2 (HCC) [I50.32] 01/24/2015  . Demand ischemia secondary to AF with RVR [I24.8] 12/13/2014  . CKD (chronic kidney disease), stage III [N18.3] 11/11/2014  . Hypokalemia [E87.6] 05/22/2014  .  Mood disorder (HCC) [F39] 03/20/2014  . HTN (hypertension) [I10] 03/20/2014  . Anemia, unspecified [D64.9] 03/20/2014  . Tobacco use disorder [F17.200] 03/20/2014  . Atrial fibrillation with RVR (HCC) [I48.91] 03/03/2014  . Hypotension [I95.9] 03/03/2014  . Pulmonary hypertension (HCC) [I27.2] 03/03/2014  . COPD (chronic obstructive pulmonary disease) (HCC) [J44.9] 03/03/2014  . History of right MCA stroke [Z86.73]   . Long-term (current) use of anticoagulants [Z79.01]   . 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 Hyperlipidemia [E78.5] 02/18/2014  . CAD '07, LAD PCI 2012, SVG-PDA PTCA 11/10/14 [I25.10] 02/16/2014  . Homelessness [Z59.0] 11/12/2011  . Chronic diastolic heart failure (HCC) [I50.32]   . Pulmonary nodules [R91.8]   . Hypothyroidism [E03.9]   . OSA- C-pap intol [G47.33]    Total Time spent with patient: 30 minutes  Past Psychiatric History:   Past Medical History:  Past Medical History  Diagnosis Date  . CAD (coronary artery disease)     a.  cath 09/2010: LAD stent patent, S-Int/dCFX ok, S-PDA ok, L-LAD atretic;  b. Lexiscan Myoview (02/2014):  no ischemia, EF 55%; c. 11/2014 Cath/PCI: LM nl, LAD 20pISR, LCX 80-4m, OM1 nl, RI 70p, RCA 40-76m, RPDA 95ost/95-44m (PTCA only w/ reduction to 50p/47m), 60d, L->LAD atretic, VG->RI->OM nl, VG->PDA 100p.  [name] Hypertension   . Hx of CABG   . Hx of transesophageal echocardiography (TEE) for monitoring 11/2010    TEE 11/2010: EF 60-65%, BAE, trivial atrial septal shunt;  right heart cath in 10/2010 with elevated R and L heart pressures and diuretic started  .  HLD (hyperlipidemia)   . Hypothyroidism   . COPD (chronic obstructive pulmonary disease) (HCC)   . Pulmonary nodules     repeat CT due in 11/2011  . Acute right MCA stroke (HCC) 11/07/10  . 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 Eczema   . Depression   . Atrial fibrillation (HCC)     a. s/p TEE-DCCV 02/2104; b.  Xarelto started  . Cardiomyopathy with EF 40% at TEE 02/17/14 (likely tachycardia mediated - Myoview 02/19/14 neg for ischemia with normal EF) 02/18/2014  . 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 Sleep apnea     recent sleep study  in 04/2014 per chart review  shows no significant OSA  . Dysrhythmia 10/2014    atrial fib with rvr  . GERD (gastroesophageal reflux disease)   . Headache     Past Surgical History  Procedure Laterality Date  . Coronary artery bypass graft    . Debriment for infection in chest    . Chest wall reconstruction    . Bilateral knee surgery    . Bladder surgery    . Left mastoidectomy    . Hernia repair    . Cath 2012    . Tee without cardioversion N/A 02/17/2014    Procedure: TRANSESOPHAGEAL ECHOCARDIOGRAM (TEE);  Surgeon: Pricilla Riffle, MD;  Location: [facility] ENDOSCOPY;  Service: Cardiovascular;  Laterality: N/A;  . Cardioversion N/A 02/17/2014    Procedure: CARDIOVERSION;  Surgeon: Pricilla Riffle, MD;  Location: [facility] ENDOSCOPY;  Service: Cardiovascular;  Laterality: N/A;  . Left and right heart catheterization with coronary angiogram N/A 11/10/2014    Procedure: LEFT AND RIGHT HEART CATHETERIZATION WITH CORONARY ANGIOGRAM;  Surgeon: Marykay Lex, MD;  Location: [facility] CATH LAB;  Service: Cardiovascular;  Laterality: N/A;   Family History:  Family History  Problem Relation Age of Onset  . COPD Mother   . Heart disease Mother   . Arthritis Mother     Rheumatoid and PMR  . Osteoporosis Mother     Mom fractured hip  . Diabetes type II Mother   . Depression Mother   . Heart attack Father   . Depression Father   . Hypertension Father   . Alcohol abuse Father   . Depression Sister   . Anxiety disorder Sister   . Drug abuse Sister   . Stroke Neg Hx    Family Psychiatric  History:  Social History:  History  Alcohol Use No     History  Drug Use No    Social History   Social History  . Marital Status: Divorced    Spouse Name: N/A   . Number of Children: 2  . Years of Education: N/A   Occupational History  . DISABLE     Social History Main Topics  . Smoking status: Former Smoker -- 0.25 packs/day for 30 years    Types: Cigarettes    Quit date: 09/10/2014  . Smokeless tobacco: Never Used  . Alcohol Use: No  . Drug Use: No  . Sexual Activity: Not Currently   Other Topics Concern  . Not on file   Social History Narrative   Divorced   After stroke was living with sister and mother after sister asked her to leave. Mom has since deceased    Difficulties over control.    Then moved back in for about 6 weeks.   [name]  tranportaion difficult son  helping   Ex-smoker   Was living at friends  Sister and her had argument and she was told to leave .    She was living in a homeless shelter for the last 3 weeks [facility]    son had help with transportation and medication       Work status regular  before got sick on short-term disability now lost t insurance after the stroke and couldn't work was  denied ssi    2 times.  She is not eligible for Medicaid because she doesn't have dependent children.         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   Lives in  rented house 2 house mates males keep tp self has her own b room  Doing well    Additional Social History:                         Sleep: Good  Appetite:  Good  Current Medications: Current Outpatient Prescriptions  Medication Sig Dispense Refill  . amiodarone (PACERONE) 200 MG tablet Take 1 tablet (200 mg total) by mouth daily. 30 tablet 4  . atorvastatin (LIPITOR) 20 MG tablet Take 1 tablet (20 mg total) by mouth daily. 90 tablet 0  . buPROPion (WELLBUTRIN XL) 150 MG 24 hr tablet Take 1 tablet (150 mg total) by mouth 3 (three) times  daily. 90 tablet 5  . clopidogrel (PLAVIX) 75 MG tablet TAKE ONE TABLET BY MOUTH ONCE DAILY IN THE MORNING 30 tablet 6  . Cyanocobalamin (B-12 COMPLIANCE INJECTION IJ) Inject as directed. Pt gets injection every other week    . levothyroxine (SYNTHROID, LEVOTHROID) 100 MCG tablet Take 1 tablet (100 mcg total) by mouth daily before breakfast. 30 tablet 2  . nitroGLYCERIN (NITROSTAT) 0.4 MG SL tablet Place 1 tablet (0.4 mg total) under the tongue every 5 (five) minutes as needed for chest pain. 90 tablet 3  . ondansetron (ZOFRAN-ODT) 4 MG disintegrating tablet DISSOLVE 1 TABLET ON THE TONGUE EVERY 8 HOURS AS NEEDED FOR NAUSEA 10 tablet 0  . potassium chloride SA (K-DUR,KLOR-CON) 20 MEQ tablet Take 1 tablet (20 mEq total) by mouth 3 (three) times daily. 90 tablet 6  . rivaroxaban (XARELTO) 20 MG TABS tablet Take 1 tablet (20 mg total) by mouth daily with supper. 30 tablet 3  . tamsulosin (FLOMAX) 0.4 MG CAPS capsule Take 1 capsule (0.4 mg total) by mouth daily. 30 capsule 0  . topiramate (TOPAMAX) 50 MG tablet TAKE 1 TABLET (50 MG TOTAL) BY MOUTH 2 (TWO) TIMES DAILY. 180 tablet 0  . traMADol (ULTRAM) 50 MG tablet TAKE 2 TABLETS BY MOUTH EVERY 6 HOURS AS NEEDED FOR MODERATE PAIN 30 tablet 5  . vitamin B-12 (CYANOCOBALAMIN) 1000 MCG tablet Take 1,000 mcg by mouth daily.     No current facility-administered medications for this visit.    Lab Results: No results found for this or any previous visit (from the past 48 hour(s)).  Physical Findings: AIMS:  , ,  ,  ,    CIWA:    COWS:     Musculoskeletal: Strength & Muscle Tone: within normal limits Gait & Station: normal Patient leans: Right  Psychiatric Specialty  Exam: ROS  Blood pressure 134/86, pulse 56, height  (1.626 m), weight 144 lb 3.2 oz (65.409 kg), SpO2 98 %.Body mass index is 24.74 kg/(m^2).  General Appearance: NA  Eye Contact::  Good  Speech:  Clear and Coherent  Volume:  Normal  Mood:  NA  Affect:  Appropriate  Thought  Process:  Coherent  Orientation:  NA  Thought Content:  WDL  Suicidal Thoughts:  No  Homicidal Thoughts:  No  Memory:  NA  Judgement:  Good  Insight:  Good  Psychomotor Activity:  Normal  Concentration:  Good  Recall:  Good  Fund of Knowledge:Good  Language: Good  Akathisia:  No  Handed:  Right  AIMS (if indicated):     Assets:  Desire for Improvement  ADL's:  Intact  Cognition: WNL  Sleep:      Treatment Plan Summary: At this time the patient is doing fairly well. She has unexplainable visual hallucinations and does seem to bother her much and I suspect are related to too high the dose. At this time the patient is functioning quite well. She's got a haircut her hygiene is improved and she remains positive. She is looking forward to move to a new place. She takes her Wellbutrin as prescribed and her mood is good. She showing no vegetative symptoms at this time. Today reviewed the pros and cons of her medication. She denies any chest pain or shortness of breath. She denies any neurological symptoms other than headaches. She seems to have 2 types of headaches one is a migraine with that she takes Topamax for which she says is very effective 4. Unfortunately I think the use of tramadol might be excessive in her case. I suggested that she talk to her physician about it. This patient return to see me in 3 months. It is noted this patient is not suicidal and she is functioning well.  Zakara Parkey IRVING 06/28/2015, 4:55 PM

## 2015-07-04 ENCOUNTER — Other Ambulatory Visit: Payer: Self-pay | Admitting: *Deleted

## 2015-07-04 NOTE — Patient Outreach (Signed)
[facility]) Care Management  07/04/2015  VIRGINA EMISON June 02, 1956 [id number]   CSW made one last attempt to try and contact patient today to perform phone assessment, as well as assess and assist with social work needs and services, without success.  CSW will proceed with case closure due to inability to establish initial phone contact, despite required number of attempts made and outreach letter mailed to patient's home.  CSW will notify patient's RNCM with [facility] Care Management, Emilia Beck of CSW's plans to close patient's case.  CSW will also fax a correspondence letter to patient's Primary Care Physician, Dr. Berniece Andreas.  Danford Bad, BSW, MSW, LCSW  Licensed Restaurant manager, fast food [facility]  Mailing [place] [address], [place], [place] [address] Physical Address-[address], [place], [place] [address] Toll Free Main # [phone] Fax # [phone] Cell # [phone]  Fax # [phone]  [email or web address]

## 2015-07-11 ENCOUNTER — Ambulatory Visit (INDEPENDENT_AMBULATORY_CARE_PROVIDER_SITE_OTHER): Payer: Medicare Other | Admitting: Family Medicine

## 2015-07-11 DIAGNOSIS — E538 Deficiency of other specified B group vitamins: Secondary | ICD-10-CM

## 2015-07-11 MED ORDER — CYANOCOBALAMIN 1000 MCG/ML IJ SOLN
1000.0000 ug | Freq: Once | INTRAMUSCULAR | Status: AC
  Administered 2015-07-11: 1000 ug via INTRAMUSCULAR

## 2015-07-18 ENCOUNTER — Other Ambulatory Visit: Payer: Self-pay

## 2015-07-18 NOTE — Patient Outreach (Signed)
This RNCM was successful in making contact with patient via telephone for community care coordination assessment. This RNCM described purpose of call, briefly described [facility] programs and the referral process.  Patient stated she does not think she needs our services at this time, however, she has agreed to receiving information regarding programs offered by [facility].   Plan: Discharge patient from my caseload by sending Nena Polio, CM Assistant, notification patient has declined services. Send patient and primary care physician a discharge letter with [facility] contact information if needed in the future.

## 2015-07-19 ENCOUNTER — Emergency Department (HOSPITAL_COMMUNITY): Payer: Medicare Other

## 2015-07-19 ENCOUNTER — Emergency Department (HOSPITAL_COMMUNITY)
Admission: EM | Admit: 2015-07-19 | Discharge: 2015-07-19 | Disposition: A | Discharge status: Home / Self Care | Payer: Medicare Other | Attending: Emergency Medicine | Admitting: Emergency Medicine

## 2015-07-19 ENCOUNTER — Encounter (HOSPITAL_COMMUNITY): Payer: Self-pay | Admitting: *Deleted

## 2015-07-19 DIAGNOSIS — J449 Chronic obstructive pulmonary disease, unspecified: Secondary | ICD-10-CM | POA: Diagnosis not present

## 2015-07-19 DIAGNOSIS — R404 Transient alteration of awareness: Secondary | ICD-10-CM | POA: Diagnosis not present

## 2015-07-19 DIAGNOSIS — W19XXXA Unspecified fall, initial encounter: Secondary | ICD-10-CM

## 2015-07-19 DIAGNOSIS — E039 Hypothyroidism, unspecified: Secondary | ICD-10-CM | POA: Insufficient documentation

## 2015-07-19 DIAGNOSIS — F419 Anxiety disorder, unspecified: Secondary | ICD-10-CM | POA: Diagnosis not present

## 2015-07-19 DIAGNOSIS — R112 Nausea with vomiting, unspecified: Secondary | ICD-10-CM | POA: Diagnosis not present

## 2015-07-19 DIAGNOSIS — F329 Major depressive disorder, single episode, unspecified: Secondary | ICD-10-CM | POA: Diagnosis not present

## 2015-07-19 DIAGNOSIS — Z7901 Long term (current) use of anticoagulants: Secondary | ICD-10-CM | POA: Diagnosis not present

## 2015-07-19 DIAGNOSIS — Z7902 Long term (current) use of antithrombotics/antiplatelets: Secondary | ICD-10-CM | POA: Diagnosis not present

## 2015-07-19 DIAGNOSIS — Y92512 Supermarket, store or market as the place of occurrence of the external cause: Secondary | ICD-10-CM | POA: Insufficient documentation

## 2015-07-19 DIAGNOSIS — Y998 Other external cause status: Secondary | ICD-10-CM | POA: Insufficient documentation

## 2015-07-19 DIAGNOSIS — Z8669 Personal history of other diseases of the nervous system and sense organs: Secondary | ICD-10-CM | POA: Diagnosis not present

## 2015-07-19 DIAGNOSIS — R55 Syncope and collapse: Secondary | ICD-10-CM | POA: Insufficient documentation

## 2015-07-19 DIAGNOSIS — Z872 Personal history of diseases of the skin and subcutaneous tissue: Secondary | ICD-10-CM | POA: Insufficient documentation

## 2015-07-19 DIAGNOSIS — W1839XA Other fall on same level, initial encounter: Secondary | ICD-10-CM | POA: Insufficient documentation

## 2015-07-19 DIAGNOSIS — I10 Essential (primary) hypertension: Secondary | ICD-10-CM | POA: Diagnosis not present

## 2015-07-19 DIAGNOSIS — Y9389 Activity, other specified: Secondary | ICD-10-CM | POA: Insufficient documentation

## 2015-07-19 DIAGNOSIS — I4891 Unspecified atrial fibrillation: Secondary | ICD-10-CM | POA: Insufficient documentation

## 2015-07-19 DIAGNOSIS — Z79899 Other long term (current) drug therapy: Secondary | ICD-10-CM | POA: Diagnosis not present

## 2015-07-19 DIAGNOSIS — K219 Gastro-esophageal reflux disease without esophagitis: Secondary | ICD-10-CM | POA: Insufficient documentation

## 2015-07-19 DIAGNOSIS — Z9889 Other specified postprocedural states: Secondary | ICD-10-CM | POA: Diagnosis not present

## 2015-07-19 DIAGNOSIS — E785 Hyperlipidemia, unspecified: Secondary | ICD-10-CM | POA: Diagnosis not present

## 2015-07-19 DIAGNOSIS — Z87891 Personal history of nicotine dependence: Secondary | ICD-10-CM | POA: Insufficient documentation

## 2015-07-19 DIAGNOSIS — Z9289 Personal history of other medical treatment: Secondary | ICD-10-CM | POA: Insufficient documentation

## 2015-07-19 DIAGNOSIS — S0083XA Contusion of other part of head, initial encounter: Secondary | ICD-10-CM | POA: Diagnosis not present

## 2015-07-19 DIAGNOSIS — Z880 Allergy status to penicillin: Secondary | ICD-10-CM | POA: Insufficient documentation

## 2015-07-19 DIAGNOSIS — Z8619 Personal history of other infectious and parasitic diseases: Secondary | ICD-10-CM | POA: Diagnosis not present

## 2015-07-19 DIAGNOSIS — Z8614 Personal history of Methicillin resistant Staphylococcus aureus infection: Secondary | ICD-10-CM | POA: Insufficient documentation

## 2015-07-19 DIAGNOSIS — S0093XA Contusion of unspecified part of head, initial encounter: Secondary | ICD-10-CM | POA: Diagnosis not present

## 2015-07-19 DIAGNOSIS — Z951 Presence of aortocoronary bypass graft: Secondary | ICD-10-CM | POA: Diagnosis not present

## 2015-07-19 DIAGNOSIS — I2510 Atherosclerotic heart disease of native coronary artery without angina pectoris: Secondary | ICD-10-CM | POA: Diagnosis not present

## 2015-07-19 DIAGNOSIS — S0003XA Contusion of scalp, initial encounter: Secondary | ICD-10-CM | POA: Diagnosis not present

## 2015-07-19 LAB — CBC
HCT: 37.4 % (ref 36.0–46.0)
Hemoglobin: 12.8 g/dL (ref 12.0–15.0)
MCH: 31.1 pg (ref 26.0–34.0)
MCHC: 34.2 g/dL (ref 30.0–36.0)
MCV: 90.8 fL (ref 78.0–100.0)
PLATELETS: 184 10*3/uL (ref 150–400)
RBC: 4.12 MIL/uL (ref 3.87–5.11)
RDW: 13.3 % (ref 11.5–15.5)
WBC: 5.1 10*3/uL (ref 4.0–10.5)

## 2015-07-19 LAB — TROPONIN I

## 2015-07-19 LAB — BASIC METABOLIC PANEL
Anion gap: 11 (ref 5–15)
BUN: 26 mg/dL — AB (ref 6–20)
CALCIUM: 8.7 mg/dL — AB (ref 8.9–10.3)
CHLORIDE: 107 mmol/L (ref 101–111)
CO2: 23 mmol/L (ref 22–32)
CREATININE: 1.09 mg/dL — AB (ref 0.44–1.00)
GFR calc Af Amer: >60 mL/min (ref >60)
GFR calc non Af Amer: 55 mL/min — ABNORMAL LOW (ref >60)
GLUCOSE: 104 mg/dL — AB (ref 65–99)
Potassium: 3.5 mmol/L (ref 3.5–5.1)
Sodium: 141 mmol/L (ref 135–145)

## 2015-07-19 LAB — I-STAT TROPONIN, ED: Troponin i, poc: 0.01 ng/mL (ref 0.00–0.08)

## 2015-07-19 LAB — CBG MONITORING, ED: GLUCOSE-CAPILLARY: 91 mg/dL (ref 65–99)

## 2015-07-19 MED ORDER — ONDANSETRON HCL 4 MG/2ML IJ SOLN
4.0000 mg | Freq: Once | INTRAMUSCULAR | Status: AC
  Administered 2015-07-19: 4 mg via INTRAVENOUS
  Filled 2015-07-19: qty 2

## 2015-07-19 MED ORDER — ACETAMINOPHEN 500 MG PO TABS
1000.0000 mg | ORAL_TABLET | Freq: Once | ORAL | Status: AC
  Administered 2015-07-19: 1000 mg via ORAL
  Filled 2015-07-19: qty 2

## 2015-07-19 MED ORDER — SODIUM CHLORIDE 0.9 % IV BOLUS (SEPSIS)
500.0000 mL | Freq: Once | INTRAVENOUS | Status: AC
  Administered 2015-07-19: 500 mL via INTRAVENOUS

## 2015-07-19 MED ORDER — MORPHINE SULFATE (PF) 4 MG/ML IV SOLN: 4.0000 mg | Freq: Once | INTRAVENOUS | Status: DC

## 2015-07-19 NOTE — Consult Note (Signed)
   [facility] CM Inpatient Consult   07/19/2015  CAYLAN PATINO [birth year] [id number] Referral received but, after careful review it was found that this patient in not on the [facility] ACO registry for care management services. Patient evaluated for [facility] Care Management services.  Patient had been referred and has also refused [facility] Care Management services prior to this check to the [employer].  Reason:  Not a beneficiary currently attributed to one of the [facility] ACO Registry populations.  Membership roster was used to verify non- eligible status.  Charlesetta Shanks, RN BSN CCM [facility]  [phone] business mobile phone

## 2015-07-19 NOTE — Progress Notes (Signed)
CSW prepared food pantry and free meals lists for pt.

## 2015-07-19 NOTE — Progress Notes (Signed)
NCM consulted to assist pt with obtaining food.  NCM consulted SW and [facility] to assist with this matter.

## 2015-07-19 NOTE — Discharge Instructions (Signed)
You have been seen today for a syncopal episode. Your imaging and lab tests showed no abnormalities. Follow up with PCP as needed. Return to ED should symptoms worsen.

## 2015-07-19 NOTE — Patient Outreach (Signed)
[facility]) Care Management  07/19/2015  [name] 08-12-1956 [id number]   Notification from Emilia Beck, RN to close case due to patient refused [facility] Care Management services.  Thanks, [name]. [name] [facility] Care Management [facility] CM Assistant Phone: [phone] Fax: [phone]

## 2015-07-19 NOTE — ED Provider Notes (Signed)
  Face-to-face evaluation   History: Patient was standing outside [place] after attempting to buy food, when she passed out. She does not eat much recently because of financial difficulties. She tried to buy food, but they would not accept her check. She states that she has money to buy her medicines and food, but she has to go to the bank to cash her check. She plans on doing that tomorrow.  Physical exam: Alert, cooperative. She is somewhat anxious. Moves all extremities equally.  Medical screening examination/treatment/procedure(s) were conducted as a shared visit with non-physician practitioner(s) and myself.  I personally evaluated the patient during the encounter  [name], MD 07/20/15 [phone]

## 2015-07-19 NOTE — ED Notes (Signed)
Pt unable to urinate. MD notified

## 2015-07-19 NOTE — ED Notes (Signed)
Pt still unable to urinate. MD notified

## 2015-07-19 NOTE — ED Notes (Signed)
Pt arrives from [facility] via [facility]. Pt had a syncopal episode while shopping for groceries. Pt states that she became lightheaded and passed out and hit head on the concrete. Pt states when she came to she vomited. She denies nausea at this time. Pt c/o a headache, rates 8/10 on pain scale. Pt states that she ran out of her amiodorone and has not taken it today. HR 46

## 2015-07-19 NOTE — ED Provider Notes (Signed)
CSN: [id number]     Arrival date & time 07/19/15  1205 History   First MD Initiated Contact with Patient 07/19/15 1219     Chief Complaint  Patient presents with  . Loss of Consciousness     (Consider location/radiation/quality/duration/timing/severity/associated sxs/prior Treatment) HPI   [name] is a 59 y.o. female, with a history of CABG, CAD, CVA, A-fib, and COPD, presenting to the ED with a single syncopal episode that occurred about a hour ago while pt was standing outside of [employer] waiting for her ride. Pt states her vision closed in, went black, felt nauseous and then woke up on the ground. Pt does not know how long she was unconsciousness. Pt complains of pain on the left side of her head, rated 8/10, throbbing, non-radiating. Pt vomited once after the syncope. Pt denies ever passing out before. Pt last oral intake was yesterday afternoon, which pt states is normal for her. Pt states she suspects that she may have an UTI. Pt complains of a strong smell to her urine for the last 2 days. Pt denies dysuria or other urinary symptoms. Pt is on Xarelto and Plavix. Pt received zofran for nausea by EMS, which patient states helped her nausea. Denies recent illness, fever/chills, chest pain, shortness of breath, bleeding, or any other complaints. States she has chronic pain in her neck, which has not changed since she fell. Pt's cardiologist is Dr. Delton See. Her last appt with him was in October 2016. Pt adds that her CVA 4 years ago makes her memory spotty.   Past Medical History  Diagnosis Date  . CAD (coronary artery disease)     a.  cath 09/2010: LAD stent patent, S-Int/dCFX ok, S-PDA ok, L-LAD atretic;  b. Lexiscan Myoview (02/2014):  no ischemia, EF 55%; c. 11/2014 Cath/PCI: LM nl, LAD 20pISR, LCX 80-52m, OM1 nl, RI 70p, RCA 40-36m, RPDA 95ost/95-46m (PTCA only w/ reduction to 50p/71m), 60d, L->LAD atretic, VG->RI->OM nl, VG->PDA 100p.  [name] Hypertension   . Hx of CABG   . Hx of  transesophageal echocardiography (TEE) for monitoring 11/2010    TEE 11/2010: EF 60-65%, BAE, trivial atrial septal shunt;  right heart cath in 10/2010 with elevated R and L heart pressures and diuretic started  . HLD (hyperlipidemia)   . Hypothyroidism   . COPD (chronic obstructive pulmonary disease) (HCC)   . Pulmonary nodules     repeat CT due in 11/2011  . Acute right MCA stroke (HCC) 11/07/10  . 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 Eczema   . Depression   . Atrial fibrillation (HCC)     a. s/p TEE-DCCV 02/2104; b. Xarelto started  . Cardiomyopathy with EF 40% at TEE 02/17/14 (likely tachycardia mediated - Myoview 02/19/14 neg for ischemia with normal EF) 02/18/2014  . 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 Sleep apnea     recent sleep study  in 04/2014 per chart review  shows no significant OSA  . Dysrhythmia 10/2014    atrial fib with rvr  . GERD (gastroesophageal reflux disease)   . Headache    Past Surgical History  Procedure Laterality Date  . Coronary artery bypass graft    . Debriment for infection in chest    . Chest wall reconstruction    . Bilateral knee surgery    . Bladder surgery    . Left mastoidectomy    .  Hernia repair    . Cath 2012    . Tee without cardioversion N/A 02/17/2014    Procedure: TRANSESOPHAGEAL ECHOCARDIOGRAM (TEE);  Surgeon: Pricilla Riffle, MD;  Location: [facility] ENDOSCOPY;  Service: Cardiovascular;  Laterality: N/A;  . Cardioversion N/A 02/17/2014    Procedure: CARDIOVERSION;  Surgeon: Pricilla Riffle, MD;  Location: [facility] ENDOSCOPY;  Service: Cardiovascular;  Laterality: N/A;  . Left and right heart catheterization with coronary angiogram N/A 11/10/2014    Procedure: LEFT AND RIGHT HEART CATHETERIZATION WITH CORONARY ANGIOGRAM;  Surgeon: Marykay Lex, MD;  Location: [facility] CATH LAB;  Service: Cardiovascular;  Laterality: N/A;   Family History  Problem Relation Age of Onset  . COPD Mother   . Heart disease Mother    . Arthritis Mother     Rheumatoid and PMR  . Osteoporosis Mother     Mom fractured hip  . Diabetes type II Mother   . Depression Mother   . Heart attack Father   . Depression Father   . Hypertension Father   . Alcohol abuse Father   . Depression Sister   . Anxiety disorder Sister   . Drug abuse Sister   . Stroke Neg Hx    Social History  Substance Use Topics  . Smoking status: Former Smoker -- 0.25 packs/day for 30 years    Types: Cigarettes    Quit date: 09/10/2014  . Smokeless tobacco: Never Used  . Alcohol Use: No   OB History    No data available     Review of Systems  Constitutional: Negative for fever, chills, diaphoresis and unexpected weight change.  Respiratory: Negative for cough, chest tightness and shortness of breath.   Cardiovascular: Negative for chest pain, palpitations and leg swelling.  Gastrointestinal: Positive for nausea and vomiting. Negative for abdominal pain, diarrhea and constipation.  Genitourinary: Negative for dysuria and flank pain.  Musculoskeletal: Negative for back pain, neck pain and neck stiffness.  Skin: Negative for color change and pallor.  Neurological: Positive for syncope, light-headedness and headaches. Negative for dizziness and weakness.  Psychiatric/Behavioral: Negative for confusion.  All other systems reviewed and are negative.     Allergies  Avelox; Pamelor; Amoxicillin; Hydrocodone; Oxycodone; Penicillins; and Sulfa antibiotics  Home Medications   Prior to Admission medications   Medication Sig Start Date End Date Taking? Authorizing Provider  albuterol (PROVENTIL HFA;VENTOLIN HFA) 108 (90 BASE) MCG/ACT inhaler Inhale 1 puff into the lungs every 6 (six) hours as needed for wheezing or shortness of breath.   Yes Historical Provider, MD  amiodarone (PACERONE) 200 MG tablet Take 1 tablet (200 mg total) by mouth daily. 02/16/15  Yes Lars Masson, MD  atorvastatin (LIPITOR) 20 MG tablet Take 1 tablet (20 mg total) by  mouth daily. 05/16/15  Yes Lars Masson, MD  buPROPion (WELLBUTRIN XL) 150 MG 24 hr tablet Take 1 tablet (150 mg total) by mouth 3 (three) times daily. Patient taking differently: Take 150 mg by mouth 2 (two) times daily.  06/28/15  Yes Archer Asa, MD  clopidogrel (PLAVIX) 75 MG tablet TAKE ONE TABLET BY MOUTH ONCE DAILY IN THE MORNING 06/19/15  Yes Ok Anis, NP  Cyanocobalamin (B-12 COMPLIANCE INJECTION IJ) Inject as directed. Pt gets injection every other week   Yes Historical Provider, MD  levothyroxine (SYNTHROID, LEVOTHROID) 100 MCG tablet Take 1 tablet (100 mcg total) by mouth daily before breakfast. 05/30/15  Yes Madelin Headings, MD  potassium chloride SA (K-DUR,KLOR-CON) 20 MEQ tablet Take 1  tablet (20 mEq total) by mouth 3 (three) times daily. 01/26/15  Yes Scott Moishe Spice, PA-C  rivaroxaban (XARELTO) 20 MG TABS tablet Take 1 tablet (20 mg total) by mouth daily with supper. 01/25/15  Yes Lars Masson, MD  tamsulosin (FLOMAX) 0.4 MG CAPS capsule Take 1 capsule (0.4 mg total) by mouth daily. 12/14/14  Yes Rodolph Bong, MD  topiramate (TOPAMAX) 50 MG tablet TAKE 1 TABLET (50 MG TOTAL) BY MOUTH 2 (TWO) TIMES DAILY. 04/11/15  Yes Micki Riley, MD  traMADol (ULTRAM) 50 MG tablet TAKE 2 TABLETS BY MOUTH EVERY 6 HOURS AS NEEDED FOR MODERATE PAIN 05/25/15  Yes Micki Riley, MD  vitamin B-12 (CYANOCOBALAMIN) 1000 MCG tablet Take 1,000 mcg by mouth daily.   Yes Historical Provider, MD  nitroGLYCERIN (NITROSTAT) 0.4 MG SL tablet Place 1 tablet (0.4 mg total) under the tongue every 5 (five) minutes as needed for chest pain. 01/24/15   Lars Masson, MD  ondansetron (ZOFRAN-ODT) 4 MG disintegrating tablet DISSOLVE 1 TABLET ON THE TONGUE EVERY 8 HOURS AS NEEDED FOR NAUSEA 04/24/15   Madelin Headings, MD   BP 123/94 mmHg  Pulse 49  Temp(Src) 97.4 F (36.3 C) (Oral)  Resp 16  Ht  (1.626 m)  Wt 150 lb (68.04 kg)  BMI 25.73 kg/m2  SpO2 98% Physical Exam  Constitutional: She  is oriented to person, place, and time. She appears well-developed and well-nourished. No distress.  HENT:  Head: Normocephalic and atraumatic. Head is without raccoon's eyes and without Battle's sign.  Right Ear: Hearing, tympanic membrane, external ear and ear canal normal.  Left Ear: Hearing, tympanic membrane, external ear and ear canal normal.  Nose: Nose normal.  Mouth/Throat: Oropharynx is clear and moist.  Golf ball sized hematoma noted on left posterior parietal region of the scalp with small overlying abrasion. No active external hemorrhage.   Eyes: Conjunctivae and EOM are normal. Pupils are equal, round, and reactive to light.  Neck: Normal range of motion. Neck supple.  Cardiovascular: Regular rhythm, normal heart sounds and intact distal pulses.  Bradycardia present.   Pulmonary/Chest: Effort normal and breath sounds normal. No respiratory distress.  Abdominal: Soft. Bowel sounds are normal.  Musculoskeletal: She exhibits no edema or tenderness.  Full ROM in all extremities. Full ROM in spine. No paraspinal tenderness.   Lymphadenopathy:    She has no cervical adenopathy.  Neurological: She is alert and oriented to person, place, and time. She has normal reflexes.  No sensory deficits. Strength 5/5 in all extremities. Cranial nerves II-XII grossly intact. Gait testing deferred.  Skin: Skin is warm and dry. She is not diaphoretic.  Nursing note and vitals reviewed.   ED Course  Procedures (including critical care time) Labs Review Labs Reviewed  BASIC METABOLIC PANEL - Abnormal; Notable for the following:    Glucose, Bld 104 (*)    BUN 26 (*)    Creatinine, Ser 1.09 (*)    Calcium 8.7 (*)    GFR calc non Af Amer 55 (*)    All other components within normal limits  CBC  TROPONIN I  URINALYSIS, ROUTINE W REFLEX MICROSCOPIC (NOT AT [facility])  CBG MONITORING, ED  CBG MONITORING, ED  I-STAT TROPOININ, ED  I-STAT TROPOININ, ED    Imaging Review Ct Head Wo  Contrast  07/19/2015  CLINICAL DATA:  Status post fall today with a blow to the left side of the head. Headache. History of prior infarct. EXAM: CT HEAD  WITHOUT CONTRAST CT CERVICAL SPINE WITHOUT CONTRAST TECHNIQUE: Multidetector CT imaging of the head and cervical spine was performed following the standard protocol without intravenous contrast. Multiplanar CT image reconstructions of the cervical spine were also generated. COMPARISON:  Head CT scan 02/11/2011.  Brain MRI 01/20/2014. FINDINGS: CT HEAD FINDINGS Remote right MCA territory infarct is again seen with some ex vacuo dilatation of the right lateral ventricle noted. No evidence of acute intracranial abnormality including hemorrhage, infarct, mass lesion, mass effect, midline shift or abnormal extra-axial fluid collection is identified. There is a scalp hematoma over the left parietal bone. No fracture is identified. Scattered subcutaneous lesions are likely sebaceous cysts and increased in number since the prior head CT. Postoperative change of left mastoidectomy is again seen. CT CERVICAL SPINE FINDINGS No fracture or malalignment is identified. Scattered mild loss of disc space height is most notable at C5-6. Lung apices are unremarkable. Imaged paraspinous structures appear normal. IMPRESSION: Scalp hematoma over the left parietal bone without underlying fracture or acute intracranial abnormality. No acute abnormality cervical spine. Scattered subcutaneous lesions about the scalp likely representing sebaceous cysts. Status post left mastoidectomy. Electronically Signed   By: Drusilla Kanner M.D.   On: 07/19/2015 13:49   Ct Cervical Spine Wo Contrast  07/19/2015  CLINICAL DATA:  Status post fall today with a blow to the left side of the head. Headache. History of prior infarct. EXAM: CT HEAD WITHOUT CONTRAST CT CERVICAL SPINE WITHOUT CONTRAST TECHNIQUE: Multidetector CT imaging of the head and cervical spine was performed following the standard  protocol without intravenous contrast. Multiplanar CT image reconstructions of the cervical spine were also generated. COMPARISON:  Head CT scan 02/11/2011.  Brain MRI 01/20/2014. FINDINGS: CT HEAD FINDINGS Remote right MCA territory infarct is again seen with some ex vacuo dilatation of the right lateral ventricle noted. No evidence of acute intracranial abnormality including hemorrhage, infarct, mass lesion, mass effect, midline shift or abnormal extra-axial fluid collection is identified. There is a scalp hematoma over the left parietal bone. No fracture is identified. Scattered subcutaneous lesions are likely sebaceous cysts and increased in number since the prior head CT. Postoperative change of left mastoidectomy is again seen. CT CERVICAL SPINE FINDINGS No fracture or malalignment is identified. Scattered mild loss of disc space height is most notable at C5-6. Lung apices are unremarkable. Imaged paraspinous structures appear normal. IMPRESSION: Scalp hematoma over the left parietal bone without underlying fracture or acute intracranial abnormality. No acute abnormality cervical spine. Scattered subcutaneous lesions about the scalp likely representing sebaceous cysts. Status post left mastoidectomy. Electronically Signed   By: Drusilla Kanner M.D.   On: 07/19/2015 13:49   I have personally reviewed and evaluated these images and lab results as part of my medical decision-making.   EKG Interpretation   Date/Time:  Wednesday July 19 2015 12:10:22 EST Ventricular Rate:  47 PR Interval:  157 QRS Duration: 99 QT Interval:  494 QTC Calculation: 437 R Axis:   55 Text Interpretation:  Sinus bradycardia RSR' in V1 or V2, right VCD or RVH  Abnormal T, consider ischemia, lateral leads Baseline wander in lead(s) I  II aVR since last tracing no significant change Confirmed by Effie Shy  MD,  Mechele Collin ([id number]) on 07/19/2015 6:10:58 PM      MDM   Final diagnoses:  Fall  Syncope, unspecified syncope  type  Traumatic hematoma of head, initial encounter    MIRHA BRUCATO presents following a syncopal episode with head trauma.  Findings and plan  of care discussed with [name], MD.  Suspect pt had her syncopal episode due to poor oral intake. Pt does not take warfarin, but is on two other anti-coagulation medications. HEART and HEAR scores can not be accurately used due to patient's positive cardiac history. CT of head and c-spine indicated due to pt history, use of blood thinners, and complaint of head/neck pain. CBG is normal. No abnormalities found on CBC. BMP shows no acute abnormalities. CT of head and C-spine shows no fractures or other acute abnormalities. Specifically, shows no intracranial hemorrhage. 2:25 PM Pt reassessed and updated on CT results. Pt states her head pain is the same, even after the tylenol. Pt states she is allergic to hydrocodone and oxycodone, but has never had a problem with morphine. Chart review reviews pt has had bradycardia at her current rate. Troponin is negative. Social work was contacted due to concern that patient cannot properly take care of herself due to lack of funds. 4:36 PM Pt states her head feels much better. Pt is resting comfortably on her bed, eating a meal, and watching tv. Social work and Case Management evaluated patient, but pt refused assistance offered to her. Pt was given a list of food pantries in the area.  [name], PA-C 07/19/15 1828  [name], MD 07/20/15 [phone]

## 2015-07-25 ENCOUNTER — Ambulatory Visit (INDEPENDENT_AMBULATORY_CARE_PROVIDER_SITE_OTHER): Payer: Medicare Other | Admitting: Family Medicine

## 2015-07-25 ENCOUNTER — Other Ambulatory Visit: Payer: Self-pay | Admitting: Cardiology

## 2015-07-25 DIAGNOSIS — E538 Deficiency of other specified B group vitamins: Secondary | ICD-10-CM | POA: Diagnosis not present

## 2015-07-25 DIAGNOSIS — I4891 Unspecified atrial fibrillation: Secondary | ICD-10-CM

## 2015-07-25 DIAGNOSIS — I25810 Atherosclerosis of coronary artery bypass graft(s) without angina pectoris: Secondary | ICD-10-CM

## 2015-07-25 DIAGNOSIS — E785 Hyperlipidemia, unspecified: Secondary | ICD-10-CM

## 2015-07-25 DIAGNOSIS — I10 Essential (primary) hypertension: Secondary | ICD-10-CM

## 2015-07-25 MED ORDER — CYANOCOBALAMIN 1000 MCG/ML IJ SOLN
1000.0000 ug | Freq: Once | INTRAMUSCULAR | Status: AC
Start: 2015-07-25 — End: 2015-07-25
  Administered 2015-07-25: 1000 ug via INTRAMUSCULAR

## 2015-07-25 MED ORDER — AMIODARONE HCL 200 MG PO TABS: 200.0000 mg | ORAL_TABLET | Freq: Every day | ORAL | Status: DC

## 2015-07-27 ENCOUNTER — Encounter: Payer: Self-pay | Admitting: Internal Medicine

## 2015-07-27 ENCOUNTER — Ambulatory Visit (INDEPENDENT_AMBULATORY_CARE_PROVIDER_SITE_OTHER): Payer: Medicare Other | Admitting: Internal Medicine

## 2015-07-27 VITALS — BP 158/94 | Temp 98.4°F | Ht 64.0 in | Wt 142.8 lb

## 2015-07-27 DIAGNOSIS — S0990XA Unspecified injury of head, initial encounter: Secondary | ICD-10-CM | POA: Diagnosis not present

## 2015-07-27 DIAGNOSIS — E46 Unspecified protein-calorie malnutrition: Secondary | ICD-10-CM | POA: Diagnosis not present

## 2015-07-27 DIAGNOSIS — Z7901 Long term (current) use of anticoagulants: Secondary | ICD-10-CM

## 2015-07-27 DIAGNOSIS — Z87898 Personal history of other specified conditions: Secondary | ICD-10-CM

## 2015-07-27 DIAGNOSIS — I25810 Atherosclerosis of coronary artery bypass graft(s) without angina pectoris: Secondary | ICD-10-CM

## 2015-07-27 DIAGNOSIS — S0003XA Contusion of scalp, initial encounter: Secondary | ICD-10-CM

## 2015-07-27 DIAGNOSIS — Z9189 Other specified personal risk factors, not elsewhere classified: Secondary | ICD-10-CM | POA: Diagnosis not present

## 2015-07-27 DIAGNOSIS — Z789 Other specified health status: Secondary | ICD-10-CM

## 2015-07-27 DIAGNOSIS — G44309 Post-traumatic headache, unspecified, not intractable: Secondary | ICD-10-CM

## 2015-07-27 DIAGNOSIS — Z724 Inappropriate diet and eating habits: Secondary | ICD-10-CM

## 2015-07-27 NOTE — Patient Instructions (Signed)
I  think this was not your heart  And ok for now  Cold compresses to area of head that hurts .   Expect improviment in the next 2 weeks or so  However if increasing type  headache balance changes   Seek  immediate care .  contiunue  Nutrition and hydration awareness.   Will flag cardiology about what happened and see if they think more eval should be done.   ROV in  Week of December 5th   Or as needed. Sometimes can have a post concussion headache that eventually gets better over time.  Can use tramadol cautiously but can add to dizziness and syncope . Ice   Packs to sore area on health .

## 2015-07-27 NOTE — Progress Notes (Signed)
Pre visit review using our clinic review tool, if applicable. No additional management support is needed unless otherwise documented below in the visit note.  Chief Complaint  Patient presents with  . Headache    Fell last Wednesday.  Hit her head.    . Fall  . Nausea  . Emesis    HPI: Patient come in for follow up from ED visit for falling /syncope. [name] 59 y.o. has multiple medical conditinos and medications  inaddition to hx of food insecurity and then food intolerance  Aversion of certain foods and malnutrition issues in the past  But has been eating much better   At her last visit balance was much better  And looked much healthier eating better etc  Problems .   Went to [employer] and hadn't eaten yet that am  Waiting for SCAT outside for about 5- 10 minutes and began feeling lightheaded and faint without cp sob  Tried to get back in bldg and sit down but fainted LOC and hit left head    Rest see below . Has hx of lightheaded in shower at times but no tachycardia sx or other cp sob sweating .  ? If pulse was low  In ed?  Has had ha left side of head and neck no new neuro sx  Numbness vomiting  Had taken tramadol that am for pain .   BELOW  IS RECORD OF ED VISIT  Fall  Syncope, unspecified syncope type  Traumatic hematoma of head, initial encounter    ADALIE MAND presents following a syncopal episode with head trauma.  Findings and plan of care discussed with [name], MD.  Suspect pt had her syncopal episode due to poor oral intake. Pt does not take warfarin, but is on two other anti-coagulation medications. HEART and HEAR scores can not be accurately used due to patient's positive cardiac history. CT of head and c-spine indicated due to pt history, use of blood thinners, and complaint of head/neck pain. CBG is normal. No abnormalities found on CBC. BMP shows no acute abnormalities. CT of head and C-spine shows no fractures or other acute abnormalities.  Specifically, shows no intracranial hemorrhage. 2:25 PM Pt reassessed and updated on CT results. Pt states her head pain is the same, even after the tylenol. Pt states she is allergic to hydrocodone and oxycodone, but has never had a problem with morphine. Chart review reviews pt has had bradycardia at her current rate. Troponin is negative. Social work was contacted due to concern that patient cannot properly take care of herself due to lack of funds. 4:36 PM Pt states her head feels much better. Pt is resting comfortably on her bed, eating a meal, and watching tv. Social work and Case Management evaluated patient, but pt refused assistance offered to her. Pt was given a list of food pantries in the area.  [name], PA-C 07/19/15 [id number]  [name], MD 07/20/15 1915        ROS: See pertinent positives and negatives per HPI.  Past Medical History  Diagnosis Date  . CAD (coronary artery disease)     a.  cath 09/2010: LAD stent patent, S-Int/dCFX ok, S-PDA ok, L-LAD atretic;  b. Lexiscan Myoview (02/2014):  no ischemia, EF 55%; c. 11/2014 Cath/PCI: LM nl, LAD 20pISR, LCX 80-64m, OM1 nl, RI 70p, RCA 40-40m, RPDA 95ost/95-54m (PTCA only w/ reduction to 50p/13m), 60d, L->LAD atretic, VG->RI->OM nl, VG->PDA 100p.  [name] Hypertension   . Hx  of CABG   . Hx of transesophageal echocardiography (TEE) for monitoring 11/2010    TEE 11/2010: EF 60-65%, BAE, trivial atrial septal shunt;  right heart cath in 10/2010 with elevated R and L heart pressures and diuretic started  . HLD (hyperlipidemia)   . Hypothyroidism   . COPD (chronic obstructive pulmonary disease) (HCC)   . Pulmonary nodules     repeat CT due in 11/2011  . Acute right MCA stroke (HCC) 11/07/10  . 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 Eczema   . Depression   . Atrial fibrillation (HCC)     a. s/p TEE-DCCV 02/2104; b. Xarelto started  . Cardiomyopathy with EF 40% at TEE 02/17/14 (likely tachycardia mediated - Myoview  02/19/14 neg for ischemia with normal EF) 02/18/2014  . 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 Sleep apnea     recent sleep study  in 04/2014 per chart review  shows no significant OSA  . Dysrhythmia 10/2014    atrial fib with rvr  . GERD (gastroesophageal reflux disease)   . Headache     Family History  Problem Relation Age of Onset  . COPD Mother   . Heart disease Mother   . Arthritis Mother     Rheumatoid and PMR  . Osteoporosis Mother     Mom fractured hip  . Diabetes type II Mother   . Depression Mother   . Heart attack Father   . Depression Father   . Hypertension Father   . Alcohol abuse Father   . Depression Sister   . Anxiety disorder Sister   . Drug abuse Sister   . Stroke Neg Hx     Social History   Social History  . Marital Status: Divorced    Spouse Name: N/A  . Number of Children: 2  . Years of Education: N/A   Occupational History  . DISABLE     Social History Main Topics  . Smoking status: Former Smoker -- 0.25 packs/day for 30 years    Types: Cigarettes    Quit date: 09/10/2014  . Smokeless tobacco: Never Used  . Alcohol Use: No  . Drug Use: No  . Sexual Activity: Not Currently   Other Topics Concern  . None   Social History Narrative   Divorced   After stroke was living with sister and mother after sister asked her to leave. Mom has since deceased    Difficulties over control.    Then moved back in for about 6 weeks.   . tranportaion difficult son  helping   Ex-smoker   Was living at friends  Sister and her had argument and she was told to leave .    She was living in a homeless shelter for the last 3 weeks [facility]    son had help with transportation and medication       Work status regular  before got sick on short-term disability now lost t insurance after the stroke and couldn't work was  denied ssi    2 times.  She is not eligible for Medicaid because she doesn't have dependent children.           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   Lives in  rented house 2 house mates males keep tp self has her own b room  Doing well     Outpatient Prescriptions Prior to Visit  Medication Sig Dispense Refill  . amiodarone (PACERONE) 200 MG tablet Take 1 tablet (200 mg total) by mouth daily. 30 tablet 4  . atorvastatin (LIPITOR) 20 MG tablet Take 1 tablet (20 mg total) by mouth daily. 90 tablet 0  . buPROPion (WELLBUTRIN XL) 150 MG 24 hr tablet Take 1 tablet (150 mg total) by mouth 3 (three) times daily. (Patient taking differently: Take 150 mg by mouth 2 (two) times daily. ) 90 tablet 5  . clopidogrel (PLAVIX) 75 MG tablet TAKE ONE TABLET BY MOUTH ONCE DAILY IN THE MORNING 30 tablet 6  . Cyanocobalamin (B-12 COMPLIANCE INJECTION IJ) Inject as directed. Pt gets injection every other week    . levothyroxine (SYNTHROID, LEVOTHROID) 100 MCG tablet Take 1 tablet (100 mcg total) by mouth daily before breakfast. 30 tablet 2  . nitroGLYCERIN (NITROSTAT) 0.4 MG SL tablet Place 1 tablet (0.4 mg total) under the tongue every 5 (five) minutes as needed for chest pain. 90 tablet 3  . ondansetron (ZOFRAN-ODT) 4 MG disintegrating tablet DISSOLVE 1 TABLET ON THE TONGUE EVERY 8 HOURS AS NEEDED FOR NAUSEA 10 tablet 0  . potassium chloride SA (K-DUR,KLOR-CON) 20 MEQ tablet Take 1 tablet (20 mEq total) by mouth 3 (three) times daily. 90 tablet 6  . rivaroxaban (XARELTO) 20 MG TABS tablet Take 1 tablet (20 mg total) by mouth daily with supper. 30 tablet 3  . tamsulosin (FLOMAX) 0.4 MG CAPS capsule Take 1 capsule (0.4 mg total) by mouth daily. 30 capsule 0  . topiramate (TOPAMAX) 50 MG tablet TAKE 1 TABLET (50 MG TOTAL) BY MOUTH 2 (TWO) TIMES DAILY. 180 tablet 0  . traMADol (ULTRAM) 50 MG tablet TAKE 2  TABLETS BY MOUTH EVERY 6 HOURS AS NEEDED FOR MODERATE PAIN 30 tablet 5  . vitamin B-12 (CYANOCOBALAMIN) 1000 MCG tablet Take 1,000 mcg by mouth daily.    [name] albuterol (PROVENTIL HFA;VENTOLIN HFA) 108 (90 BASE) MCG/ACT inhaler Inhale 1 puff into the lungs every 6 (six) hours as needed for wheezing or shortness of breath.     No facility-administered medications prior to visit.     EXAM:  BP 158/94 mmHg  Temp(Src) 98.4 F (36.9 C) (Oral)  Ht  (1.626 m)  Wt 142 lb 12.8 oz (64.774 kg)  BMI 24.50 kg/m2  Body mass index is 24.5 kg/(m^2).  GENERAL: vitals reviewed and listed above, alert, oriented, appears well hydrated and in no acute distress alert  Looks well for her  Status  HEENT: large hematoma 3 cm left parietal scalp no bleeding  No other deformity  eoms nl , conjunctiva  clear, no obvious abnormalities on inspection of external nose and ears  Tongue is midline OP : no lesion edema or exudate  NECK: no obvious masses on inspection palpation  LUNGS: clear to auscultation bilaterally, no wheezes, rales or rhonchi, CV: HRRR, no clubbing cyanosis or  peripheral edema nl cap refill  MS: moves all extremities without noticeable focal  Abnormality Gait  Independent but has a cane .  Cautious but seems at baseline  PSYCH: pleasant and cooperative,  Mood about the same  Wt Readings from Last 3 Encounters:  07/27/15 142 lb 12.8 oz (64.774 kg)  07/19/15 150 lb (68.04 kg)  06/28/15 144 lb 3.2 oz (65.409 kg)   BP Readings from Last 3 Encounters:  07/27/15 158/94  07/19/15 134/81  06/28/15 134/86   Lab Results  Component Value Date   WBC 5.1 07/19/2015   HGB 12.8 07/19/2015   HCT 37.4 07/19/2015   PLT 184 07/19/2015   GLUCOSE 104* 07/19/2015   CHOL 210* 03/14/2015   TRIG 82.0 03/14/2015   HDL 51.00 03/14/2015   LDLDIRECT 153.0 01/15/2010   LDLCALC 143* 03/14/2015   ALT 10 05/31/2015   AST 13 05/31/2015   NA 141 07/19/2015   K 3.5 07/19/2015   CL 107 07/19/2015    CREATININE 1.09* 07/19/2015   BUN 26* 07/19/2015   CO2 23 07/19/2015   TSH 0.50 05/16/2015   INR 1.2* 11/08/2014   HGBA1C  02/11/2011    5.4 (NOTE)                                                                       According to the ADA Clinical Practice Recommendations for 2011, when HbA1c is used as a screening test:   >=6.5%   Diagnostic of Diabetes Mellitus           (if abnormal result  is confirmed)  5.7-6.4%   Increased risk of developing Diabetes Mellitus  References:Diagnosis and Classification of Diabetes Mellitus,Diabetes Care,2011,34(Suppl 1):S62-S69 and Standards of Medical Care in         Diabetes - 2011,Diabetes Care,2011,34  (Suppl 1):S11-S61.    ASSESSMENT AND PLAN:  Discussed the following assessment and plan:  Closed head injury, initial encounter  Hx of syncope - apprears to be non cardiac but if recurrent reevaluate.  does get dizzy in shower   Medically complex patient  Post-traumatic headache, not intractable, unspecified chronicity pattern - related.   to impact point.    no obv neuro change  had neg ct for intracr bleedc  Protein-calorie malnutrition (HCC) - doing much better and potassium was nl in ed!!!  Scalp hematoma, initial encounter  Problems related to inappropriate diet and eating habits - improved  continue   Anticoagulant long-term use Syncope prob  Primary cardiac but does have bradycardia at times .   Poss from not eating yet that am and had taken a tramadol / but at risk   For arrythmia " Syncope"   Disc prevention  Alarm sx for  Post trauma neuro sx and headache  Expect to improve  Fu in a2-3 weeks or as needed . Continue fall prevention.   Will flag cardiology to see if should be seen in office for reevaluation.  Certainly if recurrent near syncope sx should see cards  Team sooner .  -Patient advised to return or notify health care team  if symptoms worsen ,persist or new concerns arise. Total visit > 50% spent counseling and  coordinating care as indicated in above note and in instructions to patient .    Patient Instructions  I  think this was not your heart  And ok for now  Cold compresses to area of head that hurts .   Expect improviment in the next 2 weeks or so  However if increasing type  headache balance changes   Seek  immediate care .  contiunue  Nutrition and hydration awareness.   Will flag cardiology about what happened and see if they think more eval should be done.   ROV in  Week of December 5th   Or as needed. Sometimes can have a post concussion headache that eventually gets better over time.  Can use tramadol cautiously but can add to dizziness and syncope . Ice   Packs to sore area on health .      Neta Mends. Sevon Rotert M.D.

## 2015-07-28 ENCOUNTER — Ambulatory Visit (HOSPITAL_COMMUNITY): Payer: Self-pay | Admitting: Psychiatry

## 2015-08-07 ENCOUNTER — Other Ambulatory Visit: Payer: Self-pay | Admitting: Internal Medicine

## 2015-08-08 ENCOUNTER — Ambulatory Visit: Payer: Self-pay | Admitting: Family Medicine

## 2015-08-15 ENCOUNTER — Ambulatory Visit (INDEPENDENT_AMBULATORY_CARE_PROVIDER_SITE_OTHER): Payer: Medicare Other | Admitting: Internal Medicine

## 2015-08-15 ENCOUNTER — Encounter: Payer: Self-pay | Admitting: Internal Medicine

## 2015-08-15 VITALS — BP 130/80 | Temp 98.1°F | Ht 64.0 in | Wt 139.8 lb

## 2015-08-15 DIAGNOSIS — S0003XS Contusion of scalp, sequela: Secondary | ICD-10-CM

## 2015-08-15 DIAGNOSIS — Z87898 Personal history of other specified conditions: Secondary | ICD-10-CM

## 2015-08-15 DIAGNOSIS — Z7409 Other reduced mobility: Secondary | ICD-10-CM

## 2015-08-15 DIAGNOSIS — E538 Deficiency of other specified B group vitamins: Secondary | ICD-10-CM

## 2015-08-15 DIAGNOSIS — Z789 Other specified health status: Secondary | ICD-10-CM

## 2015-08-15 DIAGNOSIS — R54 Age-related physical debility: Secondary | ICD-10-CM

## 2015-08-15 DIAGNOSIS — I25810 Atherosclerosis of coronary artery bypass graft(s) without angina pectoris: Secondary | ICD-10-CM | POA: Diagnosis not present

## 2015-08-15 DIAGNOSIS — E46 Unspecified protein-calorie malnutrition: Secondary | ICD-10-CM

## 2015-08-15 DIAGNOSIS — Z7901 Long term (current) use of anticoagulants: Secondary | ICD-10-CM | POA: Diagnosis not present

## 2015-08-15 MED ORDER — CYANOCOBALAMIN 1000 MCG/ML IJ SOLN
1000.0000 ug | Freq: Once | INTRAMUSCULAR | Status: AC
  Administered 2015-08-15: 1000 ug via INTRAMUSCULAR

## 2015-08-15 NOTE — Progress Notes (Signed)
Pre visit review using our clinic review tool, if applicable. No additional management support is needed unless otherwise documented below in the visit note.  Chief Complaint  Patient presents with  . Follow-up    HPI: [name] 59 y.o. comes in fro fu of  From last visit fall and post traumatic ha   On anticoagulation  Ha better some lateral left neck pain no falling   No dizziness  Son helping  transportation so doesn't have to stan ling periods   Able to do some shopping on her own . Due for  b12  Sleep falling ok but has 2 wakening s and hard to stay asleep.  Eating  Ok  Still  Feels  More energy  ROS: See pertinent positives and negatives per HPI.  Past Medical History  Diagnosis Date  . CAD (coronary artery disease)     a.  cath 09/2010: LAD stent patent, S-Int/dCFX ok, S-PDA ok, L-LAD atretic;  b. Lexiscan Myoview (02/2014):  no ischemia, EF 55%; c. 11/2014 Cath/PCI: LM nl, LAD 20pISR, LCX 80-19m, OM1 nl, RI 70p, RCA 40-67m, RPDA 95ost/95-59m (PTCA only w/ reduction to 50p/5m), 60d, L->LAD atretic, VG->RI->OM nl, VG->PDA 100p.  [name] Hypertension   . Hx of CABG   . Hx of transesophageal echocardiography (TEE) for monitoring 11/2010    TEE 11/2010: EF 60-65%, BAE, trivial atrial septal shunt;  right heart cath in 10/2010 with elevated R and L heart pressures and diuretic started  . HLD (hyperlipidemia)   . Hypothyroidism   . COPD (chronic obstructive pulmonary disease) (HCC)   . Pulmonary nodules     repeat CT due in 11/2011  . Acute right MCA stroke (HCC) 11/07/10  . 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 Eczema   . Depression   . Atrial fibrillation (HCC)     a. s/p TEE-DCCV 02/2104; b. Xarelto started  . Cardiomyopathy with EF 40% at TEE 02/17/14 (likely tachycardia mediated - Myoview 02/19/14 neg for ischemia with normal EF) 02/18/2014  . 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 Sleep apnea     recent sleep study  in 04/2014 per  chart review  shows no significant OSA  . Dysrhythmia 10/2014    atrial fib with rvr  . GERD (gastroesophageal reflux disease)   . Headache     Family History  Problem Relation Age of Onset  . COPD Mother   . Heart disease Mother   . Arthritis Mother     Rheumatoid and PMR  . Osteoporosis Mother     Mom fractured hip  . Diabetes type II Mother   . Depression Mother   . Heart attack Father   . Depression Father   . Hypertension Father   . Alcohol abuse Father   . Depression Sister   . Anxiety disorder Sister   . Drug abuse Sister   . Stroke Neg Hx     Social History   Social History  . Marital Status: Divorced    Spouse Name: N/A  . Number of Children: 2  . Years of Education: N/A   Occupational History  . DISABLE     Social History Main Topics  . Smoking status: Former Smoker -- 0.25 packs/day for 30 years    Types: Cigarettes    Quit date: 09/10/2014  . Smokeless tobacco: Never Used  . Alcohol Use: No  .  Drug Use: No  . Sexual Activity: Not Currently   Other Topics Concern  . None   Social History Narrative   Divorced   After stroke was living with sister and mother after sister asked her to leave. Mom has since deceased    Difficulties over control.    Then moved back in for about 6 weeks.   . tranportaion difficult son  helping   Ex-smoker   Was living at friends  Sister and her had argument and she was told to leave .    She was living in a homeless shelter for the last 3 weeks [facility]    son had help with transportation and medication       Work status regular  before got sick on short-term disability now lost t insurance after the stroke and couldn't work was  denied ssi    2 times.  She is not eligible for Medicaid because she doesn't have dependent children.         College graduate ;[employer]ology [employer] graduated May 2011   Has children   Now on medicare /medicaid disability  So she has some acess to health services    Has a drivers  licence has stopped driving cause doesn't feel safe son takes her to get groceries .   Lives in  rented house 2 house mates males keep tp self has her own b room  Doing well     Outpatient Prescriptions Prior to Visit  Medication Sig Dispense Refill  . albuterol (PROVENTIL HFA;VENTOLIN HFA) 108 (90 BASE) MCG/ACT inhaler Inhale 1 puff into the lungs every 6 (six) hours as needed for wheezing or shortness of breath.    [name] amiodarone (PACERONE) 200 MG tablet Take 1 tablet (200 mg total) by mouth daily. 30 tablet 4  . atorvastatin (LIPITOR) 20 MG tablet Take 1 tablet (20 mg total) by mouth daily. 90 tablet 0  . buPROPion (WELLBUTRIN XL) 150 MG 24 hr tablet Take 1 tablet (150 mg total) by mouth 3 (three) times daily. (Patient taking differently: Take 150 mg by mouth 2 (two) times daily. ) 90 tablet 5  . clopidogrel (PLAVIX) 75 MG tablet TAKE ONE TABLET BY MOUTH ONCE DAILY IN THE MORNING 30 tablet 6  . Cyanocobalamin (B-12 COMPLIANCE INJECTION IJ) Inject as directed. Pt gets injection every other week    . levothyroxine (SYNTHROID, LEVOTHROID) 100 MCG tablet Take 1 tablet (100 mcg total) by mouth daily before breakfast. 30 tablet 2  . nitroGLYCERIN (NITROSTAT) 0.4 MG SL tablet Place 1 tablet (0.4 mg total) under the tongue every 5 (five) minutes as needed for chest pain. 90 tablet 3  . ondansetron (ZOFRAN-ODT) 4 MG disintegrating tablet DISSOLVE ONE TABLET IN MOUTH EVERY 8 HOURS AS NEEDED FOR NAUSEA 10 tablet 0  . potassium chloride SA (K-DUR,KLOR-CON) 20 MEQ tablet Take 1 tablet (20 mEq total) by mouth 3 (three) times daily. 90 tablet 6  . rivaroxaban (XARELTO) 20 MG TABS tablet Take 1 tablet (20 mg total) by mouth daily with supper. 30 tablet 3  . tamsulosin (FLOMAX) 0.4 MG CAPS capsule Take 1 capsule (0.4 mg total) by mouth daily. 30 capsule 0  . topiramate (TOPAMAX) 50 MG tablet TAKE 1 TABLET (50 MG TOTAL) BY MOUTH 2 (TWO) TIMES DAILY. 180 tablet 0  . traMADol (ULTRAM) 50 MG tablet TAKE 2 TABLETS BY  MOUTH EVERY 6 HOURS AS NEEDED FOR MODERATE PAIN 30 tablet 5  . vitamin B-12 (CYANOCOBALAMIN) 1000 MCG tablet Take  1,000 mcg by mouth daily.     No facility-administered medications prior to visit.     EXAM:  BP 130/80 mmHg  Temp(Src) 98.1 F (36.7 C) (Oral)  Ht  (1.626 m)  Wt 139 lb 12.8 oz (63.413 kg)  BMI 23.98 kg/m2  Body mass index is 23.98 kg/(m^2).  GENERAL: vitals reviewed and listed above, alert, oriented, appears well hydrated and in no acute distress HEENT:  residula lump left lateral skull  Non tender  , conjunctiva  clear, no obvious abnormalities on inspection of external nose and ears NECK: no obvious masses on inspection palpation  LUNGS: clear to auscultation bilaterally, no wheezes, rales or rhonchi, good air movement CV: HRRR, no clubbing cyanosis or  peripheral edema nl cap refill  MS: moves all extremities without noticeable focal  Abnormality gait inmproved and seems steady  PSYCH: pleasant and cooperative, no obvious depression or anxiety Lab Results  Component Value Date   WBC 5.1 07/19/2015   HGB 12.8 07/19/2015   HCT 37.4 07/19/2015   PLT 184 07/19/2015   GLUCOSE 104* 07/19/2015   CHOL 210* 03/14/2015   TRIG 82.0 03/14/2015   HDL 51.00 03/14/2015   LDLDIRECT 153.0 01/15/2010   LDLCALC 143* 03/14/2015   ALT 10 05/31/2015   AST 13 05/31/2015   NA 141 07/19/2015   K 3.5 07/19/2015   CL 107 07/19/2015   CREATININE 1.09* 07/19/2015   BUN 26* 07/19/2015   CO2 23 07/19/2015   TSH 0.50 05/16/2015   INR 1.2* 11/08/2014   HGBA1C  02/11/2011    5.4 (NOTE)                                                                       According to the ADA Clinical Practice Recommendations for 2011, when HbA1c is used as a screening test:   >=6.5%   Diagnostic of Diabetes Mellitus           (if abnormal result  is confirmed)  5.7-6.4%   Increased risk of developing Diabetes Mellitus  References:Diagnosis and Classification of Diabetes Mellitus,Diabetes  Care,2011,34(Suppl 1):S62-S69 and Standards of Medical Care in         Diabetes - 2011,Diabetes Care,2011,34  (Suppl 1):S11-S61.   Lab Results  Component Value Date   VITAMINB12 846 05/16/2015    ASSESSMENT AND PLAN:  Discussed the following assessment and plan:  Scalp hematoma, sequela - improved    B12 deficiency - inj today  - Plan: cyanocobalamin ((VITAMIN B-12)) injection 1,000 mcg  Anticoagulant long-term use  Medically complex patient  Protein-calorie malnutrition (HCC) - improved  disc about htis and if   problematic in future   Mobility impaired  Frailty  -Patient advised to return or notify health care team  if symptoms worsen ,persist or new concerns arise.  Patient Instructions  Glad you are doing better .  Keep eating normally .  b 12   inj today .   [place]  January appt to  [place]. depending on weather .       Neta Mends. Devondre Guzzetta M.D.  I

## 2015-08-15 NOTE — Patient Instructions (Signed)
Glad you are doing better .  Keep eating normally .  b 12   inj today .   [place]  January appt to  [place]. depending on weather .

## 2015-08-18 ENCOUNTER — Other Ambulatory Visit: Payer: Self-pay | Admitting: Cardiology

## 2015-08-18 ENCOUNTER — Other Ambulatory Visit: Payer: Self-pay | Admitting: Internal Medicine

## 2015-08-22 NOTE — Telephone Encounter (Signed)
Sent to the pharmacy by e-scribe. 

## 2015-08-23 ENCOUNTER — Other Ambulatory Visit: Payer: Self-pay

## 2015-08-23 MED ORDER — RIVAROXABAN 20 MG PO TABS: 20.0000 mg | ORAL_TABLET | Freq: Every day | ORAL | Status: DC

## 2015-08-27 ENCOUNTER — Other Ambulatory Visit: Payer: Self-pay

## 2015-08-27 MED ORDER — TOPIRAMATE 50 MG PO TABS: ORAL_TABLET | ORAL | Status: DC

## 2015-09-15 ENCOUNTER — Ambulatory Visit: Payer: Self-pay | Admitting: Internal Medicine

## 2015-09-29 ENCOUNTER — Ambulatory Visit (HOSPITAL_COMMUNITY): Payer: Self-pay | Admitting: Psychiatry

## 2015-09-30 ENCOUNTER — Other Ambulatory Visit: Payer: Self-pay | Admitting: Neurology

## 2015-10-05 ENCOUNTER — Other Ambulatory Visit: Payer: Self-pay | Admitting: Neurology

## 2015-10-06 ENCOUNTER — Other Ambulatory Visit: Payer: Self-pay

## 2015-10-06 MED ORDER — TRAMADOL HCL 50 MG PO TABS: 50.0000 mg | ORAL_TABLET | Freq: Four times a day (QID) | ORAL | Status: DC | PRN

## 2015-10-06 MED ORDER — TRAMADOL HCL 50 MG PO TABS: ORAL_TABLET | ORAL | Status: DC

## 2015-10-16 ENCOUNTER — Encounter: Payer: Self-pay | Admitting: Internal Medicine

## 2015-10-16 ENCOUNTER — Ambulatory Visit (INDEPENDENT_AMBULATORY_CARE_PROVIDER_SITE_OTHER): Payer: Medicare Other | Admitting: Internal Medicine

## 2015-10-16 VITALS — BP 148/80 | Temp 97.7°F | Ht 64.0 in | Wt 138.8 lb

## 2015-10-16 DIAGNOSIS — E46 Unspecified protein-calorie malnutrition: Secondary | ICD-10-CM

## 2015-10-16 DIAGNOSIS — Z79899 Other long term (current) drug therapy: Secondary | ICD-10-CM

## 2015-10-16 DIAGNOSIS — Z87898 Personal history of other specified conditions: Secondary | ICD-10-CM | POA: Diagnosis not present

## 2015-10-16 DIAGNOSIS — J320 Chronic maxillary sinusitis: Secondary | ICD-10-CM

## 2015-10-16 DIAGNOSIS — Z789 Other specified health status: Secondary | ICD-10-CM

## 2015-10-16 DIAGNOSIS — I10 Essential (primary) hypertension: Secondary | ICD-10-CM

## 2015-10-16 DIAGNOSIS — Z8673 Personal history of transient ischemic attack (TIA), and cerebral infarction without residual deficits: Secondary | ICD-10-CM

## 2015-10-16 DIAGNOSIS — D519 Vitamin B12 deficiency anemia, unspecified: Secondary | ICD-10-CM | POA: Diagnosis not present

## 2015-10-16 MED ORDER — CYANOCOBALAMIN 1000 MCG/ML IJ SOLN
1000.0000 ug | Freq: Once | INTRAMUSCULAR | Status: AC
  Administered 2015-10-16: 1000 ug via INTRAMUSCULAR

## 2015-10-16 MED ORDER — FLUTICASONE PROPIONATE 50 MCG/ACT NA SUSP: 2.0000 | Freq: Every day | NASAL | Status: DC

## 2015-10-16 NOTE — Progress Notes (Signed)
Pre visit review using our clinic review tool, if applicable. No additional management support is needed unless otherwise documented below in the visit note.  Chief Complaint  Patient presents with  . Follow-up    HPI: [name] 60 y.o.  comes in for chronic disease/ medication management    New concern Head cold hard to shake  coricidine hbp helps some .  Face pain over  Hurts  to ben over  Neck pain .  No fever  No cough sob changes  No discharge    Eating doing good., at this time   shoppin  Mostly on own  .    Eating some .   Depression meds no change   Inc sx with high wellbutrin . 450 so taking 300  Left foot tends to droop at times  No falling   Stable   Last b 12 shot November  Eating better has some oral  ROS: See pertinent positives and negatives per HPI.  Past Medical History  Diagnosis Date  . CAD (coronary artery disease)     a.  cath 09/2010: LAD stent patent, S-Int/dCFX ok, S-PDA ok, L-LAD atretic;  b. Lexiscan Myoview (02/2014):  no ischemia, EF 55%; c. 11/2014 Cath/PCI: LM nl, LAD 20pISR, LCX 80-70m, OM1 nl, RI 70p, RCA 40-81m, RPDA 95ost/95-2m (PTCA only w/ reduction to 50p/14m), 60d, L->LAD atretic, VG->RI->OM nl, VG->PDA 100p.  [name] Hypertension   . Hx of CABG   . Hx of transesophageal echocardiography (TEE) for monitoring 11/2010    TEE 11/2010: EF 60-65%, BAE, trivial atrial septal shunt;  right heart cath in 10/2010 with elevated R and L heart pressures and diuretic started  . HLD (hyperlipidemia)   . Hypothyroidism   . COPD (chronic obstructive pulmonary disease) (HCC)   . Pulmonary nodules     repeat CT due in 11/2011  . Acute right MCA stroke (HCC) 11/07/10  . 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 Eczema   . Depression   . Atrial fibrillation (HCC)     a. s/p TEE-DCCV 02/2104; b. Xarelto started  . Cardiomyopathy with EF 40% at TEE 02/17/14 (likely tachycardia mediated - Myoview 02/19/14 neg for ischemia with normal EF) 02/18/2014  .  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 Sleep apnea     recent sleep study  in 04/2014 per chart review  shows no significant OSA  . Dysrhythmia 10/2014    atrial fib with rvr  . GERD (gastroesophageal reflux disease)   . Headache   . Homelessness 11/12/2011    Family History  Problem Relation Age of Onset  . COPD Mother   . Heart disease Mother   . Arthritis Mother     Rheumatoid and PMR  . Osteoporosis Mother     Mom fractured hip  . Diabetes type II Mother   . Depression Mother   . Heart attack Father   . Depression Father   . Hypertension Father   . Alcohol abuse Father   . Depression Sister   . Anxiety disorder Sister   . Drug abuse Sister   . Stroke Neg Hx     Social History   Social History  . Marital Status: Divorced    Spouse Name: N/A  . Number of Children: 2  . Years of Education: N/A   Occupational History  . DISABLE     Social History  Main Topics  . Smoking status: Former Smoker -- 0.25 packs/day for 30 years    Types: Cigarettes    Quit date: 09/10/2014  . Smokeless tobacco: Never Used  . Alcohol Use: No  . Drug Use: No  . Sexual Activity: Not Currently   Other Topics Concern  . None   Social History Narrative   Divorced   After stroke was living with sister and mother after sister asked her to leave. Mom has since deceased    Difficulties over control.    Then moved back in for about 6 weeks.   . tranportaion difficult son  helping   Ex-smoker   Was living at friends  Sister and her had argument and she was told to leave .    She was living in a homeless shelter for the last 3 weeks [facility]    son had help with transportation and medication       Work status regular  before got sick on short-term disability now lost t insurance after the stroke and couldn't work was  denied ssi    2 times.  She is not eligible for Medicaid because she doesn't have dependent children.         College graduate  ;psychology [employer] graduated May 2011   Has children   Now on medicare /medicaid disability  So she has some acess to health services    Has a drivers licence has stopped driving cause doesn't feel safe son takes her to get groceries .   Lives in  rented house 2 house mates males keep tp self has her own b room  Doing well     Outpatient Prescriptions Prior to Visit  Medication Sig Dispense Refill  . albuterol (PROVENTIL HFA;VENTOLIN HFA) 108 (90 BASE) MCG/ACT inhaler Inhale 1 puff into the lungs every 6 (six) hours as needed for wheezing or shortness of breath.    [name] amiodarone (PACERONE) 200 MG tablet Take 1 tablet (200 mg total) by mouth daily. 30 tablet 4  . atorvastatin (LIPITOR) 20 MG tablet TAKE ONE TABLET BY MOUTH ONCE DAILY 90 tablet 2  . buPROPion (WELLBUTRIN XL) 150 MG 24 hr tablet Take 1 tablet (150 mg total) by mouth 3 (three) times daily. (Patient taking differently: Take 150 mg by mouth 2 (two) times daily. ) 90 tablet 5  . clopidogrel (PLAVIX) 75 MG tablet TAKE ONE TABLET BY MOUTH ONCE DAILY IN THE MORNING 30 tablet 6  . levothyroxine (SYNTHROID, LEVOTHROID) 100 MCG tablet TAKE ONE TABLET BY MOUTH ONCE DAILY BEFORE BREAKFAST 30 tablet 5  . nitroGLYCERIN (NITROSTAT) 0.4 MG SL tablet Place 1 tablet (0.4 mg total) under the tongue every 5 (five) minutes as needed for chest pain. 90 tablet 3  . ondansetron (ZOFRAN-ODT) 4 MG disintegrating tablet DISSOLVE ONE TABLET IN MOUTH EVERY 8 HOURS AS NEEDED FOR NAUSEA 10 tablet 0  . rivaroxaban (XARELTO) 20 MG TABS tablet Take 1 tablet (20 mg total) by mouth daily with supper. 30 tablet 11  . tamsulosin (FLOMAX) 0.4 MG CAPS capsule Take 1 capsule (0.4 mg total) by mouth daily. 30 capsule 0  . topiramate (TOPAMAX) 50 MG tablet TAKE 1 TABLET (50 MG TOTAL) BY MOUTH 2 (TWO) TIMES DAILY. 180 tablet 1  . traMADol (ULTRAM) 50 MG tablet TKE 2 TABLETS BY MOUTH EVERY 6 HOURS AS NEEDED FOR PAIN 30 tablet 3  . vitamin B-12 (CYANOCOBALAMIN) 1000 MCG  tablet Take 1,000 mcg by mouth daily.    [name]  potassium chloride SA (K-DUR,KLOR-CON) 20 MEQ tablet Take 1 tablet (20 mEq total) by mouth 3 (three) times daily. (Patient not taking: Reported on 10/16/2015) 90 tablet 6  . Cyanocobalamin (B-12 COMPLIANCE INJECTION IJ) Inject as directed. Pt gets injection every other week     No facility-administered medications prior to visit.     EXAM:  BP 148/80 mmHg  Temp(Src) 97.7 F (36.5 C) (Oral)  Ht 5\' 4"  (1.626 m)  Wt 138 lb 12.8 oz (62.959 kg)  BMI 23.81 kg/m2  Body mass index is 23.81 kg/(m^2).  GENERAL: vitals reviewed and listed above, alert, oriented, appears well hydrated and in no acute distress min congestion HEENT: atraumatic, conjunctiva  clear, no obvious abnormalities on inspection of external nose and ears tm clearOP : no lesion edema or exudate  Face min tender maxillary  Nares no dc  NECK: no obvious masses on inspection palpation  LUNGS: clear to auscultation bilaterally, no wheezes, rales or rhonchi, good air movement CV: HRRR, no clubbing cyanosis or  peripheral edema nl cap refill  MS: moves all extremities  Gait independent and seems stabel  No tremor  PSYCH: pleasant and cooperative nl speech slightly flat   Cognition appears good  Lab Results  Component Value Date   WBC 5.1 07/19/2015   HGB 12.8 07/19/2015   HCT 37.4 07/19/2015   PLT 184 07/19/2015   GLUCOSE 104* 07/19/2015   CHOL 210* 03/14/2015   TRIG 82.0 03/14/2015   HDL 51.00 03/14/2015   LDLDIRECT 153.0 01/15/2010   LDLCALC 143* 03/14/2015   ALT 10 05/31/2015   AST 13 05/31/2015   NA 141 07/19/2015   K 3.5 07/19/2015   CL 107 07/19/2015   CREATININE 1.09* 07/19/2015   BUN 26* 07/19/2015   CO2 23 07/19/2015   TSH 0.50 05/16/2015   INR 1.2* 11/08/2014   HGBA1C  02/11/2011    5.4 (NOTE)                                                                       According to the ADA Clinical Practice Recommendations for 2011, when HbA1c is used as a screening  test:   >=6.5%   Diagnostic of Diabetes Mellitus           (if abnormal result  is confirmed)  5.7-6.4%   Increased risk of developing Diabetes Mellitus  References:Diagnosis and Classification of Diabetes Mellitus,Diabetes Care,2011,34(Suppl 1):S62-S69 and Standards of Medical Care in         Diabetes - 2011,Diabetes Care,2011,34  (Suppl 1):S11-S61.  Total visit > 50% spent counseling and coordinating care as indicated in above note and in instructions to patient .      ASSESSMENT AND PLAN:  Discussed the following assessment and plan:  Maxillary sinusitis, unspecified chronicity  Medication management  Anemia, B12 deficiency - Plan: cyanocobalamin ((VITAMIN B-12)) injection 1,000 mcg  Essential hypertension  Medically complex patient  History of right MCA stroke  Protein-calorie malnutrition (HCC) - now resolved  eating better  follow  rov in 3-4- months lab pre visit  -Patient advised to return or notify health care team  if symptoms worsen ,persist or new concerns arise.  Patient Instructions  You  May  have  Allergy sinusitis  .  ocass bacterial  Infection  With  Nasal discharge  cam be treated with antibioticbut donet see that as the problem today Try flonase   Daily and saline nose spray moisturizing  2-3 x per day  To see if improves. Contact us if fever   persistent or progressive   Continue attending to nutrition as you are   b 12 inj to day and then   Oral b12 1000 per day   Plan recheck lab   Bmp, vit b 12 cbcdiff in 3-4 months and then ROV to see if can maintain   With oral b12 .   Sinusitis, Adult Sinusitis is redness, soreness, and inflammation of the paranasal sinuses. Paranasal sinuses are air pockets within the bones of your face. They are located beneath your eyes, in the middle of your forehead, and above your eyes. In healthy paranasal sinuses, mucus is able to drain out, and air is able to circulate through them by way of your nose. However, when  your paranasal sinuses are inflamed, mucus and air can become trapped. This can allow bacteria and other germs to grow and cause infection. Sinusitis can develop quickly and last only a short time (acute) or continue over a long period (chronic). Sinusitis that lasts for more than 12 weeks is considered chronic. CAUSES Causes of sinusitis include:  Allergies.  Structural abnormalities, such as displacement of the cartilage that separates your nostrils (deviated septum), which can decrease the air flow through your nose and sinuses and affect sinus drainage.  Functional abnormalities, such as when the small hairs (cilia) that line your sinuses and help remove mucus do not work properly or are not present. SIGNS AND SYMPTOMS Symptoms of acute and chronic sinusitis are the same. The primary symptoms are pain and pressure around the affected sinuses. Other symptoms include:  Upper toothache.  Earache.  Headache.  Bad breath.  Decreased sense of smell and taste.  A cough, which worsens when you are lying flat.  Fatigue.  Fever.  Thick drainage from your nose, which often is green and may contain pus (purulent).  Swelling and warmth over the affected sinuses. DIAGNOSIS Your health care provider will perform a physical exam. During your exam, your health care provider may perform any of the following to help determine if you have acute sinusitis or chronic sinusitis:  Look in your nose for signs of abnormal growths in your nostrils (nasal polyps).  Tap over the affected sinus to check for signs of infection.  View the inside of your sinuses using an imaging device that has a light attached (endoscope). If your health care provider suspects that you have chronic sinusitis, one or more of the following tests may be recommended:  Allergy tests.  Nasal culture. A sample of mucus is taken from your nose, sent to a lab, and screened for bacteria.  Nasal cytology. A sample of mucus is  taken from your nose and examined by your health care provider to determine if your sinusitis is related to an allergy. TREATMENT Most cases of acute sinusitis are related to a viral infection and will resolve on their own within 10 days. Sometimes, medicines are prescribed to help relieve symptoms of both acute and chronic sinusitis. These may include pain medicines, decongestants, nasal steroid sprays, or saline sprays. However, for sinusitis related to a bacterial infection, your health care provider will prescribe antibiotic medicines. These are medicines that will help kill the bacteria causing the infection. Rarely, sinusitis is caused by a  fungal infection. In these cases, your health care provider will prescribe antifungal medicine. For some cases of chronic sinusitis, surgery is needed. Generally, these are cases in which sinusitis recurs more than 3 times per year, despite other treatments. HOME CARE INSTRUCTIONS  Drink plenty of water. Water helps thin the mucus so your sinuses can drain more easily.  Use a humidifier.  Inhale steam 3-4 times a day (for example, sit in the bathroom with the shower running).  Apply a warm, moist washcloth to your face 3-4 times a day, or as directed by your health care provider.  Use saline nasal sprays to help moisten and clean your sinuses.  Take medicines only as directed by your health care provider.  If you were prescribed either an antibiotic or antifungal medicine, finish it all even if you start to feel better. SEEK IMMEDIATE MEDICAL CARE IF:  You have increasing pain or severe headaches.  You have nausea, vomiting, or drowsiness.  You have swelling around your face.  You have vision problems.  You have a stiff neck.  You have difficulty breathing.   This information is not intended to replace advice given to you by your health care provider. Make sure you discuss any questions you have with your health care provider.   Document  Released: 08/26/2005 Document Revised: 09/16/2014 Document Reviewed: 09/10/2011 Elsevier Interactive Patient Education 2016 [employer].        [place]. Shamari Trostel M.D.

## 2015-10-16 NOTE — Patient Instructions (Addendum)
You  May  have  Allergy sinusitis  .   ocass bacterial  Infection  With  Nasal discharge  cam be treated with antibioticbut donet see that as the problem today Try flonase   Daily and saline nose spray moisturizing  2-3 x per day  To see if improves. Contact us if fever   persistent or progressive   Continue attending to nutrition as you are   b 12 inj to day and then   Oral b12 1000 per day   Plan recheck lab   Bmp, vit b 12 cbcdiff in 3-4 months and then ROV to see if can maintain   With oral b12 .   Sinusitis, Adult Sinusitis is redness, soreness, and inflammation of the paranasal sinuses. Paranasal sinuses are air pockets within the bones of your face. They are located beneath your eyes, in the middle of your forehead, and above your eyes. In healthy paranasal sinuses, mucus is able to drain out, and air is able to circulate through them by way of your nose. However, when your paranasal sinuses are inflamed, mucus and air can become trapped. This can allow bacteria and other germs to grow and cause infection. Sinusitis can develop quickly and last only a short time (acute) or continue over a long period (chronic). Sinusitis that lasts for more than 12 weeks is considered chronic. CAUSES Causes of sinusitis include:  Allergies.  Structural abnormalities, such as displacement of the cartilage that separates your nostrils (deviated septum), which can decrease the air flow through your nose and sinuses and affect sinus drainage.  Functional abnormalities, such as when the small hairs (cilia) that line your sinuses and help remove mucus do not work properly or are not present. SIGNS AND SYMPTOMS Symptoms of acute and chronic sinusitis are the same. The primary symptoms are pain and pressure around the affected sinuses. Other symptoms include:  Upper toothache.  Earache.  Headache.  Bad breath.  Decreased sense of smell and taste.  A cough, which worsens when you are lying  flat.  Fatigue.  Fever.  Thick drainage from your nose, which often is green and may contain pus (purulent).  Swelling and warmth over the affected sinuses. DIAGNOSIS Your health care provider will perform a physical exam. During your exam, your health care provider may perform any of the following to help determine if you have acute sinusitis or chronic sinusitis:  Look in your nose for signs of abnormal growths in your nostrils (nasal polyps).  Tap over the affected sinus to check for signs of infection.  View the inside of your sinuses using an imaging device that has a light attached (endoscope). If your health care provider suspects that you have chronic sinusitis, one or more of the following tests may be recommended:  Allergy tests.  Nasal culture. A sample of mucus is taken from your nose, sent to a lab, and screened for bacteria.  Nasal cytology. A sample of mucus is taken from your nose and examined by your health care provider to determine if your sinusitis is related to an allergy. TREATMENT Most cases of acute sinusitis are related to a viral infection and will resolve on their own within 10 days. Sometimes, medicines are prescribed to help relieve symptoms of both acute and chronic sinusitis. These may include pain medicines, decongestants, nasal steroid sprays, or saline sprays. However, for sinusitis related to a bacterial infection, your health care provider will prescribe antibiotic medicines. These are medicines that will help kill  the bacteria causing the infection. Rarely, sinusitis is caused by a fungal infection. In these cases, your health care provider will prescribe antifungal medicine. For some cases of chronic sinusitis, surgery is needed. Generally, these are cases in which sinusitis recurs more than 3 times per year, despite other treatments. HOME CARE INSTRUCTIONS  Drink plenty of water. Water helps thin the mucus so your sinuses can drain more  easily.  Use a humidifier.  Inhale steam 3-4 times a day (for example, sit in the bathroom with the shower running).  Apply a warm, moist washcloth to your face 3-4 times a day, or as directed by your health care provider.  Use saline nasal sprays to help moisten and clean your sinuses.  Take medicines only as directed by your health care provider.  If you were prescribed either an antibiotic or antifungal medicine, finish it all even if you start to feel better. SEEK IMMEDIATE MEDICAL CARE IF:  You have increasing pain or severe headaches.  You have nausea, vomiting, or drowsiness.  You have swelling around your face.  You have vision problems.  You have a stiff neck.  You have difficulty breathing.   This information is not intended to replace advice given to you by your health care provider. Make sure you discuss any questions you have with your health care provider.   Document Released: 08/26/2005 Document Revised: 09/16/2014 Document Reviewed: 09/10/2011 Elsevier Interactive Patient Education [employer]

## 2015-10-30 ENCOUNTER — Telehealth: Payer: Self-pay

## 2015-10-30 NOTE — Telephone Encounter (Signed)
Prior auth for Xarelto 20mg sent to [facility]. 

## 2015-10-31 ENCOUNTER — Telehealth: Payer: Self-pay

## 2015-10-31 NOTE — Telephone Encounter (Signed)
Xarelto approved through 09/08/2016. [id number].

## 2015-12-01 ENCOUNTER — Other Ambulatory Visit: Payer: Self-pay | Admitting: Neurology

## 2015-12-07 ENCOUNTER — Other Ambulatory Visit: Payer: Self-pay | Admitting: Neurology

## 2015-12-07 NOTE — Telephone Encounter (Signed)
Tramadol prescription fax to  [facility] at [phone]. Fax receive and confirm.

## 2015-12-12 ENCOUNTER — Telehealth: Payer: Self-pay | Admitting: Neurology

## 2015-12-12 NOTE — Telephone Encounter (Signed)
Patient is calling and states that she is waiting on doctor's approval and a Rx to be sent to [facility] on [address].  Please call

## 2015-12-12 NOTE — Telephone Encounter (Signed)
If patient calls back, tell her that I call [facility] and the tramadol has been there since 12/07/2015. It was fax 12/07/2015. The prescription has been ready. The medication is ready for pick up.

## 2015-12-18 ENCOUNTER — Other Ambulatory Visit: Payer: Self-pay | Admitting: Cardiology

## 2016-01-14 ENCOUNTER — Other Ambulatory Visit: Payer: Self-pay | Admitting: Neurology

## 2016-01-18 ENCOUNTER — Other Ambulatory Visit: Payer: Self-pay | Admitting: Neurology

## 2016-01-18 ENCOUNTER — Other Ambulatory Visit: Payer: Self-pay | Admitting: Internal Medicine

## 2016-01-19 NOTE — Telephone Encounter (Signed)
Ok to refill x 1  

## 2016-01-19 NOTE — Telephone Encounter (Signed)
Sent to the pharmacy by e-scribe. 

## 2016-01-22 ENCOUNTER — Telehealth: Payer: Self-pay | Admitting: Neurology

## 2016-01-22 MED ORDER — TRAMADOL HCL 50 MG PO TABS: ORAL_TABLET | ORAL | Status: DC

## 2016-01-22 NOTE — Telephone Encounter (Signed)
Patient is calling to order a Rx tradamol 50 mg to [facility], [place].  She states she is having headaches more frequently and has to take one almost every day now and wants to know if she can have more pills.  Thanks!

## 2016-01-24 ENCOUNTER — Other Ambulatory Visit: Payer: Self-pay | Admitting: Nurse Practitioner

## 2016-02-12 ENCOUNTER — Encounter: Payer: Self-pay | Admitting: Neurology

## 2016-02-12 ENCOUNTER — Ambulatory Visit (INDEPENDENT_AMBULATORY_CARE_PROVIDER_SITE_OTHER): Payer: Medicare Other | Admitting: Neurology

## 2016-02-12 VITALS — BP 136/94 | HR 80 | Wt 147.0 lb

## 2016-02-12 DIAGNOSIS — G44209 Tension-type headache, unspecified, not intractable: Secondary | ICD-10-CM | POA: Diagnosis not present

## 2016-02-12 MED ORDER — TRAMADOL HCL 50 MG PO TABS: ORAL_TABLET | ORAL | Status: DC

## 2016-02-12 MED ORDER — DIVALPROEX SODIUM ER 500 MG PO TB24: 500.0000 mg | ORAL_TABLET | Freq: Every day | ORAL | Status: DC

## 2016-02-12 NOTE — Progress Notes (Signed)
[facility] [address] [place]. [place] [id number] [phone]       OFFICE FOLLOW UP VISIT NOTE  Tina. Laural BenesKathi G [name] Date of Birth:  March 02, 1956 Medical Record Number:  [id number]   Referring MD:  Elvina SidleKurt Lauenstein  Reason for Referral:   headaches HPI: [name] is [age] year Caucasian lady who has a long-standing history of migraine headaches which were previously quite infrequent but who the last 3-4 months or so she noted increasing frequency and severity of headaches. She describes the headache as bitemporal pressure-like moderate to severe 8/10 in severity. Headache is a complaint by mild nausea but no vomiting. She does have light sensitivity and feels better if she sleeps. She takes Ultram which seems to help but going to sleep as necessary. The past she is to get relief with Tylenol No. 3 but she developed allergy to codeine with itching and had stopped it. Headache frequency is every second or third day. She does remember being on Topamax in the past for migraine prophylaxis but stopped 10 years ago. She denies any visual symptoms with the headache, accompanying focal neurological symptoms. She is unable to address this retrieval for headache. 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 She was seen by me at that time she also had a TIA in June 2012 with an MRI showing no acute findings. She was homeless and had no finances and hence she did not keep subsequent followup in our office. She in fact [place] [facility] [place] is to stay with family. She wasn't Medicaid for a while and recently got Medicaid approval and has moved back to [place]. She was seen on 11/16/13 in urgent care by Dr. Maye Hidesurt Lowenstein who ordered some lab work which showed is significantly limited TSH and lipids. The patient had been off her thyroid medication for more than a month at that time. She has subsequently been started on thyroxine but has not  noticed significant improvement. She in fact was seen by a neurologist in [place] for tremor which seems to have resolved. She is on Lipitor 40 mg but states she was switched from Crestor which worked quite well for her but Medicaid would not cover crestor. Now that she has Medicare   she wants to go back on Crestor but wants to wait for this until she sees cardiologist Dr. Delton SeeNelson next week. She states she takes her medication regularly now and her blood pressure is well controlled though it is slightly limited in the office today and 142/97. She has not had any screening carotid Dopplers done for several years. She denies any recurrent TIA or stroke symptoms for the last 3 years since her last visit. She denies any scalp tenderness, jaw claudication, myalgias or loss of vision or blurred vision with her headaches. Update 03/28/2014 : She returns for f/u after initial consult on 01/03/14. She states she's noticed improvement in her headaches which now occur only to 3 times per week and only in the mornings and respond well to determine all. She is tolerating Topamax 50 mg twice daily without any side effects and feels overall better. She however is complaining of excessive daytime sleepiness and she in fact his care to drive as she fell asleep once. She does have a history of sleep apnea and was prescribed a CPAP but she has not been taking it for a year. She states she has a mask which fits but does not  have a CPAP machine. She wants referral to a sleep physician. She was diagnosed with atrial flutter transiently and switched from Plavix to Xarelto. She was admitted for elective cardioversion but spontaneously converted to sinus rhythm. She had MRI scan the brain done on 01/20/2014 which I have personally reviewed shows remote age right middle cerebral artery infarct with encephalomalacia and gliosis. There were moderate changes of chronic microvascular ischemia which appeared more advanced compared with previous  MRI scan dated 03/07/2011. Carotid ultrasound dated 01/14/14 personally reviewed shows no significant except anastomosis and passed Doppler studies show mildly abnormal velocities in the right anterior cerebral artery and left middle cerebral artery of unclear significance. Update 02/03/2015 : She returns for follow-up after last visit in July 2015. She is doing much better with her migraines now and Topamax seems to be working. She is tolerating 50 mg morning and 100 at night without significant side effects. She remains on Xarelto which is causing some minor bruising but no bleeding episodes. She states her blood pressure is very well controlled and lipid profile last checked was fine. She's had no recurrent TIA or stroke symptoms. She continues to have atrial fibrillation off and on and recently has been started on Amiodarone which seems to be working. She had polysomnogram done on 04/21/14 which I have reviewed which shows no evidence of sleep apnea. Update 02/12/2016 : She returns for follow-up after last visit with me one year ago. She is doing well from stroke standpoint without recurrent stroke or TIA symptoms. She remains on Xarelto which is tolerating well but does have easy bruising and she is also on Plavix. She states her last lipid profile checked by primary physician was satisfactory in February this year. She was seen in the emergency room in November last year for episode of brief passing out. She had gone to [facility] and had been waiting for her ride outside when she felt lightheaded and faint and fell down and hit her head with brief loss of consciousness. Apparently her blood pressure was found to be low. CT scan of the brain and cervical spine did not show any fractures or acute abnormalities. She states she's done well since then without recurrent stroke or TIA symptoms. She however has noticed worsening of headaches for the last 6 months which now occur almost daily. The headache starts off in the  neck and goes up to the back of the head. She describes this as moderate in intensity since 6/10 at last all day. Tramadol does help in sleeping also helps. She does take 1 or 2 tablets of Tramadol every day. She remains on Topamax 50 g twice daily which is tolerating well without side effects. She has not had a severe migraine headache since starting Topamax. She does not do regular neck stretching exercises or any activities for stress laxation. She complains of some supersensitivity to smiles at times has her hearing sounds in the other room and she is in the bedroom. ROS:   14 system review of systems is positive for headache, eye itching, ringing in the ears, daytime sleepiness, neck pain, easy bruising, passing out and all other systems negative PMH:  Past Medical History  Diagnosis Date  . CAD (coronary artery disease)     a.  cath 09/2010: LAD stent patent, S-Int/dCFX ok, S-PDA ok, L-LAD atretic;  b. Lexiscan Myoview (02/2014):  no ischemia, EF 55%; c. 11/2014 Cath/PCI: LM nl, LAD 20pISR, LCX 80-30m, OM1 nl, RI 70p, RCA 40-67m, RPDA 95ost/95-10m (PTCA  only w/ reduction to 50p/75m), 60d, L->LAD atretic, VG->RI->OM nl, VG->PDA 100p.  [name] Hypertension   . Hx of CABG   . Hx of transesophageal echocardiography (TEE) for monitoring 11/2010    TEE 11/2010: EF 60-65%, BAE, trivial atrial septal shunt;  right heart cath in 10/2010 with elevated R and L heart pressures and diuretic started  . HLD (hyperlipidemia)   . Hypothyroidism   . COPD (chronic obstructive pulmonary disease) (HCC)   . Pulmonary nodules     repeat CT due in 11/2011  . Acute right MCA stroke (HCC) 11/07/10  . 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 Eczema   . Depression   . Atrial fibrillation (HCC)     a. s/p TEE-DCCV 02/2104; b. Xarelto started  . Cardiomyopathy with EF 40% at TEE 02/17/14 (likely tachycardia mediated - Myoview 02/19/14 neg for ischemia with normal EF) 02/18/2014  . 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 Sleep apnea     recent sleep study  in 04/2014 per chart review  shows no significant OSA  . Dysrhythmia 10/2014    atrial fib with rvr  . GERD (gastroesophageal reflux disease)   . Headache   . Homelessness 11/12/2011    Social History:  Social History   Social History  . Marital Status: Divorced    Spouse Name: N/A  . Number of Children: 2  . Years of Education: N/A   Occupational History  . DISABLE     Social History Main Topics  . Smoking status: Former Smoker -- 0.25 packs/day for 30 years    Types: Cigarettes    Quit date: 09/10/2014  . Smokeless tobacco: Never Used  . Alcohol Use: No  . Drug Use: No  . Sexual Activity: Not Currently   Other Topics Concern  . Not on file   Social History Narrative   Divorced   After stroke was living with sister and mother after sister asked her to leave. Mom has since deceased    Difficulties over control.    Then moved back in for about 6 weeks.   . tranportaion difficult son  helping   Ex-smoker   Was living at friends  Sister and her had argument and she was told to leave .    She was living in a homeless shelter for the last 3 weeks [facility]    son had help with transportation and medication       Work status regular  before got sick on short-term disability now lost t insurance after the stroke and couldn't work was  denied ssi    2 times.  She is not eligible for Medicaid because she doesn't have dependent children.         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   Lives in  rented house 2 house mates males keep tp self has her own b room  Doing well     Medications:   Current Outpatient Prescriptions on File Prior to Visit  Medication Sig Dispense Refill  .  albuterol (PROVENTIL HFA;VENTOLIN HFA) 108 (90 BASE) MCG/ACT inhaler Inhale 1 puff into the lungs every 6 (six)  hours as needed for wheezing or shortness of breath.    [name] amiodarone (PACERONE) 200 MG tablet TAKE ONE TABLET BY MOUTH ONCE DAILY 30 tablet 3  . atorvastatin (LIPITOR) 20 MG tablet TAKE ONE TABLET BY MOUTH ONCE DAILY 90 tablet 2  . buPROPion (WELLBUTRIN XL) 150 MG 24 hr tablet Take 1 tablet (150 mg total) by mouth 3 (three) times daily. (Patient taking differently: Take 150 mg by mouth 2 (two) times daily. ) 90 tablet 5  . clopidogrel (PLAVIX) 75 MG tablet TAKE ONE TABLET BY MOUTH IN THE MORNING 30 tablet 3  . fluticasone (FLONASE) 50 MCG/ACT nasal spray Place 2 sprays into both nostrils daily. 16 g 2  . levothyroxine (SYNTHROID, LEVOTHROID) 100 MCG tablet TAKE ONE TABLET BY MOUTH ONCE DAILY BEFORE BREAKFAST 30 tablet 5  . nitroGLYCERIN (NITROSTAT) 0.4 MG SL tablet Place 1 tablet (0.4 mg total) under the tongue every 5 (five) minutes as needed for chest pain. 90 tablet 3  . ondansetron (ZOFRAN-ODT) 4 MG disintegrating tablet DISSOLVE ONE TABLET IN MOUTH EVERY 8 HOURS AS NEEDED FOR NAUSEA 10 tablet 0  . potassium chloride SA (K-DUR,KLOR-CON) 20 MEQ tablet Take 1 tablet (20 mEq total) by mouth 3 (three) times daily. 90 tablet 6  . rivaroxaban (XARELTO) 20 MG TABS tablet Take 1 tablet (20 mg total) by mouth daily with supper. 30 tablet 11  . tamsulosin (FLOMAX) 0.4 MG CAPS capsule Take 1 capsule (0.4 mg total) by mouth daily. 30 capsule 0  . topiramate (TOPAMAX) 50 MG tablet TAKE 1 TABLET (50 MG TOTAL) BY MOUTH 2 (TWO) TIMES DAILY. 180 tablet 1  . vitamin B-12 (CYANOCOBALAMIN) 1000 MCG tablet Take 1,000 mcg by mouth daily.     No current facility-administered medications on file prior to visit.    Allergies:   Allergies  Allergen Reactions  . Avelox [Moxifloxacin Hcl In Nacl] Other (See Comments)    Mental breakdown.  [name] [Nortriptyline Hcl] Other (See Comments)    Made her want  to hurt herself  . Amoxicillin Hives  . Hydrocodone Itching  . Oxycodone Itching  . Penicillins Hives    Has patient had a PCN reaction causing immediate rash, facial/tongue/throat swelling, SOB or lightheadedness with hypotension: Yes Has patient had a PCN reaction causing severe rash involving mucus membranes or skin necrosis: Yes Has patient had a PCN reaction that required hospitalization Yes Has patient had a PCN reaction occurring within the last 10 years: Yes If all of the above answers are "NO", then may proceed with Cephalosporin use.   . Sulfa Antibiotics Other (See Comments)    unknown    Physical Exam General: well developed, well nourished middle-age Caucasian lady, seated, in no evident distress Head: head normocephalic and atraumatic.   Neck: supple with no carotid or supraclavicular bruits Cardiovascular: regular rate and rhythm, no murmurs Musculoskeletal: no deformity Skin:  no rash/petichiae Vascular:  Normal pulses all extremities Filed Vitals:   02/12/16 0953  BP: 136/94  Pulse: 80    Neurologic Exam Mental Status: Awake and fully alert. Oriented to place and time. Recent and remote memory intact. Recall 3/3. Animal naming 9. Attention span, concentration and fund of knowledge appropriate. Mood and affect appropriate.  Cranial Nerves: Fundoscopic exam not done  . Pupils equal, briskly reactive to light. Extraocular movements full without nystagmus. Visual fields full to confrontation. Hearing intact. Facial sensation intact. Mild left lower facial asymmetry.Tongue, palate moves normally and symmetrically.  Motor: Normal bulk and tone. Normal  strength in all tested extremity muscles. Diminished fine finger movements on the left. Orbits right over left upper extremity. Minimum weakness of the left grip. Sensory.: intact to tough and pinprick and vibratory.  Coordination: Rapid alternating movements normal in all extremities. Finger-to-nose and heel-to-shin  performed accurately bilaterally. Gait and Station: Arises from chair without difficulty. Stance is normal. Gait demonstrates normal stride length and balance . Able to heel, toe and tandem walk without difficulty.  Reflexes: 1+ and symmetric. Toes downgoing.       ASSESSMENT: 35 year lady with long-standing history of migraine headaches with now mixed tension and migraine headaches which have increased in frequency and severity over the last 6 months. Remote history of right MCA infarct in February 2012 and TIA in June 2012 with mild residual left sided weakness. Multiple risk factors of hypertension, hyperlipidemia, coronary artery disease and   sleep apnoea.    PLAN: I had a long discussion with the patient in regards to her tension headaches and recommend she do regular neck stretching exercises and will prescribe Depakote ER 500 mg daily. She'll continue tramadol as needed but I have advised her to limit taking it not more than 3-4 days a week. I also advised her to do regular neck stretching exercises. She'll continue Topamax in the current dose of 50 mg twice daily. She will stay on Xarelto for stroke prevention and maintain strict control of lipids with LDL cholesterol goal below 70 mg percent and hypertension with blood pressure goal below 130/90. Greater than 50% time during this 25 minute visit was spent on counseling and coordination of care about her headaches and stroke prevention and risk. She will return for follow-up in 3 months with nurse practitioner or call earlier if necessary. Tina Heady, MD  Note: This document was prepared with digital dictation and possible smart phrase technology. Any transcriptional errors that result from this process are unintentional.

## 2016-02-12 NOTE — Patient Instructions (Signed)
I had a long discussion with the patient in regards to her tension headaches and recommend she do regular neck stretching exercises and will prescribe Depakote ER 500 mg daily. She'll continue tramadol as needed but I have advised her to limit taking it not more than 3-4 days a week. I also advised her to do regular neck stretching exercises. She'll continue Topamax and the current dose of 50 mg twice daily. She will stay on Xarelto for stroke prevention and maintain strict control of lipids with LDL cholesterol goal below 70 mg percent and hypertension with blood pressure goal below 130/90. She will return for follow-up in 3 months with nurse practitioner or call earlier if necessary. Tension Headache A tension headache is a feeling of pain, pressure, or aching that is often felt over the front and sides of the head. The pain can be dull, or it can feel tight (constricting). Tension headaches are not normally associated with nausea or vomiting, and they do not get worse with physical activity. Tension headaches can last from 30 minutes to several days. This is the most common type of headache. CAUSES The exact cause of this condition is not known. Tension headaches often begin after stress, anxiety, or depression. Other triggers may include:  Alcohol.  Too much caffeine, or caffeine withdrawal.  Respiratory infections, such as colds, flu, or sinus infections.  Dental problems or teeth clenching.  Fatigue.  Holding your head and neck in the same position for a long period of time, such as while using a computer.  Smoking. SYMPTOMS Symptoms of this condition include:  A feeling of pressure around the head.  Dull, aching head pain.  Pain felt over the front and sides of the head.  Tenderness in the muscles of the head, neck, and shoulders. DIAGNOSIS This condition may be diagnosed based on your symptoms and a physical exam. Tests may be done, such as a CT scan or an MRI of your head.  These tests may be done if your symptoms are severe or unusual. TREATMENT This condition may be treated with lifestyle changes and medicines to help relieve symptoms. HOME CARE INSTRUCTIONS Managing Pain  Take over-the-counter and prescription medicines only as told by your health care provider.  Lie down in a dark, quiet room when you have a headache.  If directed, apply ice to the head and neck area:  Put ice in a plastic bag.  Place a towel between your skin and the bag.  Leave the ice on for 20 minutes, 2-3 times per day.  Use a heating pad or a hot shower to apply heat to the head and neck area as told by your health care provider. Eating and Drinking  Eat meals on a regular schedule.  Limit alcohol use.  Decrease your caffeine intake, or stop using caffeine. General Instructions  Keep all follow-up visits as told by your health care provider. This is important.  Keep a headache journal to help find out what may trigger your headaches. For example, write down:  What you eat and drink.  How much sleep you get.  Any change to your diet or medicines.  Try massage or other relaxation techniques.  Limit stress.  Sit up straight, and avoid tensing your muscles.  Do not use tobacco products, including cigarettes, chewing tobacco, or e-cigarettes. If you need help quitting, ask your health care provider.  Exercise regularly as told by your health care provider.  Get 7-9 hours of sleep, or the amount recommended  by your health care provider. SEEK MEDICAL CARE IF:  Your symptoms are not helped by medicine.  You have a headache that is different from what you normally experience.  You have nausea or you vomit.  You have a fever. SEEK IMMEDIATE MEDICAL CARE IF:  Your headache becomes severe.  You have repeated vomiting.  You have a stiff neck.  You have a loss of vision.  You have problems with speech.  You have pain in your eye or ear.  You have  muscular weakness or loss of muscle control.  You lose your balance or you have trouble walking.  You feel faint or you pass out.  You have confusion.   This information is not intended to replace advice given to you by your health care provider. Make sure you discuss any questions you have with your health care provider.   Document Released: 08/26/2005 Document Revised: 05/17/2015 Document Reviewed: 12/19/2014 Elsevier Interactive Patient Education [employer]

## 2016-02-13 ENCOUNTER — Other Ambulatory Visit (INDEPENDENT_AMBULATORY_CARE_PROVIDER_SITE_OTHER): Payer: Medicare Other

## 2016-02-13 DIAGNOSIS — E876 Hypokalemia: Secondary | ICD-10-CM | POA: Diagnosis not present

## 2016-02-13 LAB — BASIC METABOLIC PANEL
BUN: 24 mg/dL — AB (ref 6–23)
CHLORIDE: 110 meq/L (ref 96–112)
CO2: 25 meq/L (ref 19–32)
Calcium: 7.9 mg/dL — ABNORMAL LOW (ref 8.4–10.5)
Creatinine, Ser: 1.15 mg/dL (ref 0.40–1.20)
GFR: 51.45 mL/min — AB (ref >60.00)
GLUCOSE: 82 mg/dL (ref 70–99)
POTASSIUM: 2.6 meq/L — AB (ref 3.5–5.1)
SODIUM: 146 meq/L — AB (ref 135–145)

## 2016-02-19 NOTE — Progress Notes (Signed)
Pre visit review using our clinic review tool, if applicable. No additional management support is needed unless otherwise documented below in the visit note.  Chief Complaint  Patient presents with  . Follow-up    HPI: [name] 60 y.o.  Comes in today for routine follow-up. Chronic disease management  She states she is doing okay no major changes. It was noted last week when she had her blood work done that she had a very low potassium of 2.8 was not notified of this until yesterday.Was previously on 3 pills a day or 60 mEq. Has been taking 4 a day for the last 2 days.. She had stopped taking her potassium for little while but denies any diarrhea vomiting or stopping eating. She is not on a diuretic on medication. No active bleeding no falling he states her depression is about the same. Still leaves in the same situation was shared kitchen. This is problematic for her eating situation.  She has not been taking the oral B12 because this is difficult to go down swallowing pills. The plane in the past was to try to give her oral medicine and injection as possible. ROS: See pertinent positives and negatives per HPI.  Past Medical History  Diagnosis Date  . CAD (coronary artery disease)     a.  cath 09/2010: LAD stent patent, S-Int/dCFX ok, S-PDA ok, L-LAD atretic;  b. Lexiscan Myoview (02/2014):  no ischemia, EF 55%; c. 11/2014 Cath/PCI: LM nl, LAD 20pISR, LCX 80-41m, OM1 nl, RI 70p, RCA 40-59m, RPDA 95ost/95-65m (PTCA only w/ reduction to 50p/41m), 60d, L->LAD atretic, VG->RI->OM nl, VG->PDA 100p.  [name] Hypertension   . Hx of CABG   . Hx of transesophageal echocardiography (TEE) for monitoring 11/2010    TEE 11/2010: EF 60-65%, BAE, trivial atrial septal shunt;  right heart cath in 10/2010 with elevated R and L heart pressures and diuretic started  . HLD (hyperlipidemia)   . Hypothyroidism   . COPD (chronic obstructive pulmonary disease) (HCC)   . Pulmonary nodules     repeat CT due in 11/2011    . Acute right MCA stroke (HCC) 11/07/10  . 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 Eczema   . Depression   . Atrial fibrillation (HCC)     a. s/p TEE-DCCV 02/2104; b. Xarelto started  . Cardiomyopathy with EF 40% at TEE 02/17/14 (likely tachycardia mediated - Myoview 02/19/14 neg for ischemia with normal EF) 02/18/2014  . 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 Sleep apnea     recent sleep study  in 04/2014 per chart review  shows no significant OSA  . Dysrhythmia 10/2014    atrial fib with rvr  . GERD (gastroesophageal reflux disease)   . Headache   . Homelessness 11/12/2011    Family History  Problem Relation Age of Onset  . COPD Mother   . Heart disease Mother   . Arthritis Mother     Rheumatoid and PMR  . Osteoporosis Mother     Mom fractured hip  . Diabetes type II Mother   . Depression Mother   . Heart attack Father   . Depression Father   . Hypertension Father   . Alcohol abuse Father   . Depression Sister   . Anxiety disorder Sister   . Drug abuse Sister   . Stroke Neg Hx     Social History  Social History  . Marital Status: Divorced    Spouse Name: N/A  . Number of Children: 2  . Years of Education: N/A   Occupational History  . DISABLE     Social History Main Topics  . Smoking status: Former Smoker -- 0.25 packs/day for 30 years    Types: Cigarettes    Quit date: 09/10/2014  . Smokeless tobacco: Never Used  . Alcohol Use: No  . Drug Use: No  . Sexual Activity: Not Currently   Other Topics Concern  . None   Social History Narrative   Divorced   After stroke was living with sister and mother after sister asked her to leave. Mom has since deceased    Difficulties over control.    Then moved back in for about 6 weeks.   . tranportaion difficult son  helping   Ex-smoker   Was living at friends  Sister and her had argument and she was told to leave .    She was living in a homeless shelter for the last  3 weeks [facility]    son had help with transportation and medication       Work status regular  before got sick on short-term disability now lost t insurance after the stroke and couldn't work was  denied ssi    2 times.  She is not eligible for Medicaid because she doesn't have dependent children.         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   Lives in  rented house 2 house mates males keep tp self has her own b room  Doing well     Outpatient Prescriptions Prior to Visit  Medication Sig Dispense Refill  . albuterol (PROVENTIL HFA;VENTOLIN HFA) 108 (90 BASE) MCG/ACT inhaler Inhale 1 puff into the lungs every 6 (six) hours as needed for wheezing or shortness of breath.    [name] amiodarone (PACERONE) 200 MG tablet TAKE ONE TABLET BY MOUTH ONCE DAILY 30 tablet 3  . atorvastatin (LIPITOR) 20 MG tablet TAKE ONE TABLET BY MOUTH ONCE DAILY 90 tablet 2  . buPROPion (WELLBUTRIN XL) 150 MG 24 hr tablet Take 1 tablet (150 mg total) by mouth 3 (three) times daily. (Patient taking differently: Take 150 mg by mouth 2 (two) times daily. ) 90 tablet 5  . clopidogrel (PLAVIX) 75 MG tablet TAKE ONE TABLET BY MOUTH IN THE MORNING 30 tablet 3  . divalproex (DEPAKOTE ER) 500 MG 24 hr tablet Take 1 tablet (500 mg total) by mouth daily. 30 tablet 3  . fluticasone (FLONASE) 50 MCG/ACT nasal spray Place 2 sprays into both nostrils daily. 16 g 2  . levothyroxine (SYNTHROID, LEVOTHROID) 100 MCG tablet TAKE ONE TABLET BY MOUTH ONCE DAILY BEFORE BREAKFAST 30 tablet 5  . nitroGLYCERIN (NITROSTAT) 0.4 MG SL tablet Place 1 tablet (0.4 mg total) under the tongue every 5 (five) minutes as needed for chest pain. 90 tablet 3  . ondansetron (ZOFRAN-ODT) 4 MG disintegrating tablet DISSOLVE ONE TABLET IN MOUTH EVERY  8 HOURS AS NEEDED FOR NAUSEA 10 tablet 0  . potassium chloride SA (K-DUR,KLOR-CON) 20 MEQ tablet Take 1 tablet (20 mEq total) by mouth 3 (three) times daily. 90 tablet 6  . rivaroxaban (XARELTO) 20 MG  TABS tablet Take 1 tablet (20 mg total) by mouth daily with supper. 30 tablet 11  . tamsulosin (FLOMAX) 0.4 MG CAPS capsule Take 1 capsule (0.4 mg total) by mouth daily. 30 capsule 0  . topiramate (TOPAMAX) 50 MG tablet TAKE 1 TABLET (50 MG TOTAL) BY MOUTH 2 (TWO) TIMES DAILY. 180 tablet 1  . traMADol (ULTRAM) 50 MG tablet TAKE TWO TABLETS BY MOUTH EVERY 6 HOURS AS NEEDED FOR PAIN 30 tablet 0  . vitamin B-12 (CYANOCOBALAMIN) 1000 MCG tablet Take 1,000 mcg by mouth daily.     No facility-administered medications prior to visit.     EXAM:  BP 124/80 mmHg  Temp(Src) 98.2 F (36.8 C) (Oral)  Wt 149 lb 1.6 oz (67.631 kg)  Body mass index is 25.58 kg/(m^2).  GENERAL: vitals reviewed and listed above, alert, oriented, appears well hydrated and in no acute distresss omewhat flat affect but cognition and conversation intact  HEENT: atraumatic, conjunctiva  clear, no obvious abnormalities on inspection of external nose and ears   NECK: no obvious masses on inspection palpation  LUNGS: clear to auscultation bilaterally, no wheezes, rales or rhonchi,  CV: HRRR, no clubbing cyanosis or  peripheral edema nl cap refill  Gait  flar footed but  Independent   MS: moves all extremities without noticeable focal  Abnormality Skin non icteric  Nl cap refill no acute rashes  PSYCH: pleasant and cooperative,monotone voice  Lab Results  Component Value Date   WBC 5.1 07/19/2015   HGB 12.8 07/19/2015   HCT 37.4 07/19/2015   PLT 184 07/19/2015   GLUCOSE 82 02/13/2016   CHOL 210* 03/14/2015   TRIG 82.0 03/14/2015   HDL 51.00 03/14/2015   LDLDIRECT 153.0 01/15/2010   LDLCALC 143* 03/14/2015   ALT 10 05/31/2015   AST 13 05/31/2015   NA 146* 02/13/2016   K 2.6* 02/13/2016   CL 110 02/13/2016    CREATININE 1.15 02/13/2016   BUN 24* 02/13/2016   CO2 25 02/13/2016   TSH 0.50 05/16/2015   INR 1.2* 11/08/2014   HGBA1C  02/11/2011    5.4 (NOTE)                                                                       According to the ADA Clinical Practice Recommendations for 2011, when HbA1c is used as a screening test:   >=6.5%   Diagnostic of Diabetes Mellitus           (if abnormal result  is confirmed)  5.7-6.4%   Increased risk of developing Diabetes Mellitus  References:Diagnosis and Classification of Diabetes Mellitus,Diabetes Care,2011,34(Suppl 1):S62-S69 and Standards of Medical Care in         Diabetes - 2011,Diabetes Care,2011,34  (Suppl 1):S11-S61.   Wt Readings from Last 3 Encounters:  02/20/16 149 lb 1.6 oz (67.631 kg)  02/12/16 147 lb (66.679 kg)  10/16/15 138 lb 12.8 oz (62.959 kg)    ASSESSMENT AND PLAN:  Discussed the following assessment and plan:  Hypokalemia - severe  see text. - Plan: Basic metabolic panel, Magnesium, Vitamin B12, Prealbumin, Calcium, ionized  Protein-calorie malnutrition (HCC) - Plan: Vitamin B12, Prealbumin  Medication management - Plan: Basic metabolic panel, Magnesium, Vitamin B12, Prealbumin, Calcium, ionized  B12  deficiency - Plan: Basic metabolic panel, Magnesium, Vitamin B12, Prealbumin, Calcium, ionized, cyanocobalamin ((VITAMIN B-12)) injection 1,000 mcg  Hypocalcemia - poss from  low albumin nutrition  check ionized  ca and prealbumin  Medically complex patient  Essential hypertension Last vit   b12  Is an oissues  Getting  It down .  Uncertain why the hypokalemia is back unless she stopped taking it for longer June just a few days. Her weight appears to be stable and increasing hopefully from nutritional causes. Recheck lab today because she is here though she is only restarted it for 48 hours and then plan follow-up lab work. And recheck in 3 months for office visit unless needed before then. Patient has had significant  hypokalemia in the past and she is aware of consequences. Total visit > 50% spent counseling and coordinating care as indicated in above note and in instructions to patient .   -Patient advised to return or notify health care team  if symptoms worsen ,persist or new concerns arise.  Patient Instructions   Continue  4 potasium pills per day   For now Will notify you  of labs when available.  Checking b12 level but get shot today. Plan FU  Depending on labs   OR 3 months      Neta Mends. Pietrina Jagodzinski M.D.

## 2016-02-20 ENCOUNTER — Other Ambulatory Visit: Payer: Self-pay | Admitting: Internal Medicine

## 2016-02-20 ENCOUNTER — Encounter: Payer: Self-pay | Admitting: Internal Medicine

## 2016-02-20 ENCOUNTER — Telehealth: Payer: Self-pay | Admitting: *Deleted

## 2016-02-20 ENCOUNTER — Other Ambulatory Visit: Payer: Self-pay | Admitting: Family Medicine

## 2016-02-20 ENCOUNTER — Ambulatory Visit (INDEPENDENT_AMBULATORY_CARE_PROVIDER_SITE_OTHER): Payer: Medicare Other | Admitting: Internal Medicine

## 2016-02-20 VITALS — BP 124/80 | Temp 98.2°F | Wt 149.1 lb

## 2016-02-20 DIAGNOSIS — Z79899 Other long term (current) drug therapy: Secondary | ICD-10-CM | POA: Diagnosis not present

## 2016-02-20 DIAGNOSIS — E876 Hypokalemia: Secondary | ICD-10-CM

## 2016-02-20 DIAGNOSIS — Z789 Other specified health status: Secondary | ICD-10-CM

## 2016-02-20 DIAGNOSIS — E46 Unspecified protein-calorie malnutrition: Secondary | ICD-10-CM | POA: Diagnosis not present

## 2016-02-20 DIAGNOSIS — I10 Essential (primary) hypertension: Secondary | ICD-10-CM

## 2016-02-20 DIAGNOSIS — R790 Abnormal level of blood mineral: Secondary | ICD-10-CM

## 2016-02-20 DIAGNOSIS — E538 Deficiency of other specified B group vitamins: Secondary | ICD-10-CM | POA: Diagnosis not present

## 2016-02-20 DIAGNOSIS — Z87898 Personal history of other specified conditions: Secondary | ICD-10-CM

## 2016-02-20 LAB — BASIC METABOLIC PANEL
BUN: 24 mg/dL — AB (ref 6–23)
CHLORIDE: 111 meq/L (ref 96–112)
CO2: 24 meq/L (ref 19–32)
Calcium: 7.3 mg/dL — ABNORMAL LOW (ref 8.4–10.5)
Creatinine, Ser: 1.12 mg/dL (ref 0.40–1.20)
GFR: 53.04 mL/min — ABNORMAL LOW (ref >60.00)
GLUCOSE: 73 mg/dL (ref 70–99)
POTASSIUM: 3.3 meq/L — AB (ref 3.5–5.1)
Sodium: 146 mEq/L — ABNORMAL HIGH (ref 135–145)

## 2016-02-20 LAB — MAGNESIUM: Magnesium: 0.9 mg/dL — CL (ref 1.5–2.5)

## 2016-02-20 LAB — VITAMIN B12: Vitamin B-12: 473 pg/mL (ref 211–911)

## 2016-02-20 MED ORDER — MAGNESIUM OXIDE 400 MG PO TABS: 400.0000 mg | ORAL_TABLET | Freq: Two times a day (BID) | ORAL | Status: DC

## 2016-02-20 MED ORDER — CYANOCOBALAMIN 1000 MCG/ML IJ SOLN
1000.0000 ug | Freq: Once | INTRAMUSCULAR | Status: AC
  Administered 2016-02-20: 1000 ug via INTRAMUSCULAR

## 2016-02-20 NOTE — Patient Instructions (Addendum)
  Continue  4 potasium pills per day   For now Will notify you  of labs when available.  Checking b12 level but get shot today. Plan FU  Depending on labs   OR 3 months

## 2016-02-20 NOTE — Telephone Encounter (Signed)
See result note  Add ing mag oxide  To her potassium nad repeat in  1 week

## 2016-02-20 NOTE — Telephone Encounter (Signed)
CRITICAL VALUE STICKER  CRITICAL VALUE: Magnesium 0.9   RECEIVER (INITALS): MJ, RN (Notified by Rossie Muskrat)  DATE & TIME NOTIFIED: 02/20/16 4:00PM  MD NOTIFIED: Dr. Fabian Sharp   TIME OF NOTIFICATION: 4:02 PM  RESPONSE: Provider aware. No immediate interventions requested at time of notification.

## 2016-02-21 ENCOUNTER — Other Ambulatory Visit: Payer: Self-pay | Admitting: Physician Assistant

## 2016-02-21 ENCOUNTER — Other Ambulatory Visit: Payer: Self-pay | Admitting: Internal Medicine

## 2016-02-21 LAB — CALCIUM, IONIZED: CALCIUM ION: 3.9 mg/dL — AB (ref 4.8–5.6)

## 2016-02-21 LAB — PREALBUMIN: PREALBUMIN: 27 mg/dL (ref 17–34)

## 2016-02-21 NOTE — Telephone Encounter (Signed)
Rx refill sent to pharmacy. 

## 2016-02-28 ENCOUNTER — Other Ambulatory Visit (INDEPENDENT_AMBULATORY_CARE_PROVIDER_SITE_OTHER): Payer: Medicare Other

## 2016-02-28 DIAGNOSIS — E876 Hypokalemia: Secondary | ICD-10-CM

## 2016-02-28 DIAGNOSIS — R790 Abnormal level of blood mineral: Secondary | ICD-10-CM

## 2016-02-28 LAB — BASIC METABOLIC PANEL
BUN: 22 mg/dL (ref 6–23)
CHLORIDE: 113 meq/L — AB (ref 96–112)
CO2: 25 mEq/L (ref 19–32)
Calcium: 8.3 mg/dL — ABNORMAL LOW (ref 8.4–10.5)
Creatinine, Ser: 1.06 mg/dL (ref 0.40–1.20)
GFR: 56.51 mL/min — ABNORMAL LOW (ref >60.00)
GLUCOSE: 79 mg/dL (ref 70–99)
POTASSIUM: 4.1 meq/L (ref 3.5–5.1)
SODIUM: 144 meq/L (ref 135–145)

## 2016-02-28 LAB — MAGNESIUM: Magnesium: 1.1 mg/dL — ABNORMAL LOW (ref 1.5–2.5)

## 2016-03-01 ENCOUNTER — Other Ambulatory Visit: Payer: Self-pay | Admitting: Family Medicine

## 2016-03-01 DIAGNOSIS — E039 Hypothyroidism, unspecified: Secondary | ICD-10-CM

## 2016-03-01 DIAGNOSIS — I10 Essential (primary) hypertension: Secondary | ICD-10-CM

## 2016-03-01 DIAGNOSIS — E876 Hypokalemia: Secondary | ICD-10-CM

## 2016-03-04 ENCOUNTER — Other Ambulatory Visit: Payer: Self-pay | Admitting: Neurology

## 2016-03-05 ENCOUNTER — Other Ambulatory Visit: Payer: Self-pay

## 2016-03-05 MED ORDER — TOPIRAMATE 50 MG PO TABS: ORAL_TABLET | ORAL | Status: DC

## 2016-03-22 ENCOUNTER — Other Ambulatory Visit: Payer: Self-pay

## 2016-04-07 IMAGING — CR DG CHEST 1V PORT
1 series · 1 of 1 positions shown · non-contrast
Comparison: 08/10/2011

CLINICAL DATA: Shortness breath. Cough. Chest wall pain near
sternum. Infection post CABG with removal of portion of the sternum
filled with muscle from arm. Hypertension.

EXAM:
PORTABLE CHEST - 1 VIEW

[AP]
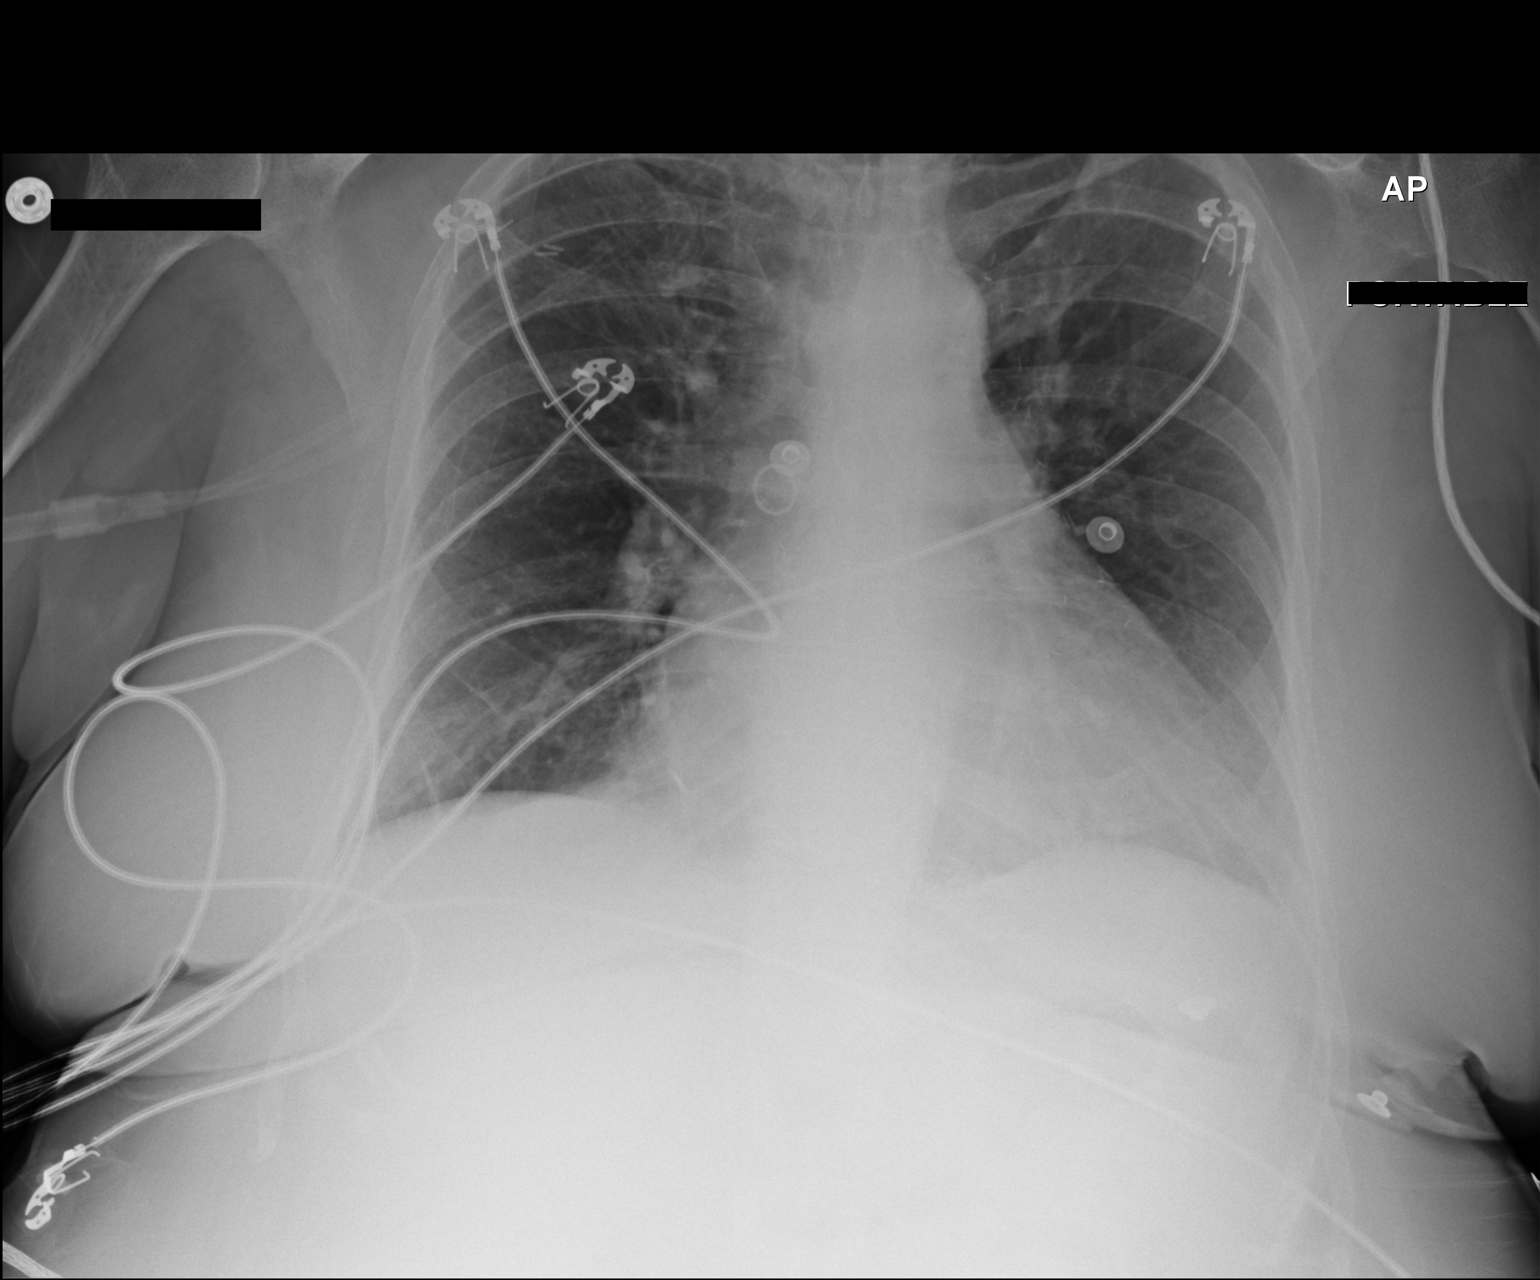

[1 of 1 positions shown; findings below may reference images not displayed]

FINDINGS: Post CABG.  Cardiomegaly.

Central pulmonary vascular prominence without pulmonary edema.

Surgical clips project over the lateral aspect the right upper lobe.

No segmental consolidation or gross pneumothorax.

Limited evaluation of the sternum by portable chest examination. If
there were a clinical suspicion of abnormality at this level, CT
imaging would be necessary for further delineation.
IMPRESSION: Post CABG.  Cardiomegaly.

Central pulmonary vascular prominence without pulmonary edema.

No segmental consolidation.

Limited evaluation of the sternum by portable chest examination. If
there were a clinical suspicion of abnormality at this level, CT
imaging would be necessary for further delineation.

## 2016-04-22 ENCOUNTER — Other Ambulatory Visit: Payer: Self-pay | Admitting: Neurology

## 2016-05-18 ENCOUNTER — Other Ambulatory Visit: Payer: Self-pay | Admitting: Neurology

## 2016-05-20 ENCOUNTER — Other Ambulatory Visit: Payer: Self-pay

## 2016-05-20 MED ORDER — TRAMADOL HCL 50 MG PO TABS: ORAL_TABLET | ORAL | 0 refills | Status: DC

## 2016-05-21 ENCOUNTER — Telehealth: Payer: Self-pay

## 2016-05-21 NOTE — Telephone Encounter (Signed)
Rn spoke with patient about needing refills for ultram.Rn explain that per Dr. Pearlean Brownie he can do only 30 days of tramadol. Rn stated per Dr.Sethi if she needs tramadol every month an appt needs to be made with the NP. Rn stated the medication is for as needed. Pt stated she did not make an appt because she has to make payment arrangements with billing at [facility]. Rn explain this will be her last refill. Rn stated if she needs more refills to seek her PCP . Pt verbalized understanding.

## 2016-05-22 ENCOUNTER — Ambulatory Visit: Payer: Self-pay | Admitting: Internal Medicine

## 2016-05-28 ENCOUNTER — Other Ambulatory Visit: Payer: Self-pay | Admitting: Cardiology

## 2016-05-31 ENCOUNTER — Other Ambulatory Visit: Payer: Self-pay | Admitting: Cardiology

## 2016-06-22 ENCOUNTER — Other Ambulatory Visit: Payer: Self-pay | Admitting: Nurse Practitioner

## 2016-07-04 ENCOUNTER — Ambulatory Visit (INDEPENDENT_AMBULATORY_CARE_PROVIDER_SITE_OTHER): Payer: Medicare Other | Admitting: Nurse Practitioner

## 2016-07-04 ENCOUNTER — Encounter: Payer: Self-pay | Admitting: Nurse Practitioner

## 2016-07-04 VITALS — BP 152/100 | HR 64 | Ht 64.0 in | Wt 164.2 lb

## 2016-07-04 DIAGNOSIS — G44209 Tension-type headache, unspecified, not intractable: Secondary | ICD-10-CM

## 2016-07-04 DIAGNOSIS — Z8673 Personal history of transient ischemic attack (TIA), and cerebral infarction without residual deficits: Secondary | ICD-10-CM

## 2016-07-04 DIAGNOSIS — E7800 Pure hypercholesterolemia, unspecified: Secondary | ICD-10-CM | POA: Diagnosis not present

## 2016-07-04 DIAGNOSIS — R51 Headache: Secondary | ICD-10-CM

## 2016-07-04 DIAGNOSIS — I10 Essential (primary) hypertension: Secondary | ICD-10-CM

## 2016-07-04 DIAGNOSIS — R519 Headache, unspecified: Secondary | ICD-10-CM

## 2016-07-04 MED ORDER — DIVALPROEX SODIUM ER 500 MG PO TB24: 1000.0000 mg | ORAL_TABLET | Freq: Every day | ORAL | 6 refills | Status: DC

## 2016-07-04 NOTE — Progress Notes (Signed)
[facility]  PATIENT: [name] DOB: [birth year]   REASON FOR VISIT: Follow-up for history of headaches, history of stroke event in 2015 HISTORY FROM: Patient    HISTORY OF PRESENT ILLNESS:  [name] is 60 year Caucasian lady who has a long-standing history of migraine headaches which were previously quite infrequent but who the last 3-4 months or so she noted increasing frequency and severity of headaches. She describes the headache as bitemporal pressure-like moderate to severe 8/10 in severity. Headache is a complaint by mild nausea but no vomiting. She does have light sensitivity and feels better if she sleeps. She takes Ultram which seems to help but going to sleep as necessary. The past she is to get relief with Tylenol No. 3 but she developed allergy to codeine with itching and had stopped it. Headache frequency is every second or third day. She does remember being on Topamax in the past for migraine prophylaxis but stopped 10 years ago. She denies any visual symptoms with the headache, accompanying focal neurological symptoms. She is unable to address this retrieval for headache. 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 She was seen by me at that time she also had a TIA in June 2012 with an MRI showing no acute findings. She was homeless and had no finances and hence she did not keep subsequent followup in our office. She in fact move to [facility] is to stay with family. She wasn't Medicaid for a while and recently got Medicaid approval and has moved back to [place]. She was seen on 11/16/13 in urgent care by Dr. Maye Hides who ordered some lab work which showed is significantly limited TSH and lipids. The patient had been off her thyroid medication for more than a month at that time. She has subsequently been started on thyroxine but has not noticed significant improvement. She in fact was seen by a  neurologist in [place] for tremor which seems to have resolved. She is on Lipitor 40 mg but states she was switched from Crestor which worked quite well for her but Medicaid would not cover crestor. Now that she has Medicare   she wants to go back on Crestor but wants to wait for this until she sees cardiologist Dr. Delton See next week. She states she takes her medication regularly now and her blood pressure is well controlled though it is slightly limited in the office today and 142/97. She has not had any screening carotid Dopplers done for several years. She denies any recurrent TIA or stroke symptoms for the last 3 years since her last visit. She denies any scalp tenderness, jaw claudication, myalgias or loss of vision or blurred vision with her headaches. Update 03/28/2014 : She returns for f/u after initial consult on 01/03/14. She states she's noticed improvement in her headaches which now occur only to 3 times per week and only in the mornings and respond well to determine all. She is tolerating Topamax 50 mg twice daily without any side effects and feels overall better. She however is complaining of excessive daytime sleepiness and she in fact his care to drive as she fell asleep once. She does have a history of sleep apnea and was prescribed a CPAP but she has not been taking it for a year. She states she has a mask which fits but does not have a CPAP machine. She wants referral to a sleep physician. She was diagnosed  with atrial flutter transiently and switched from Plavix to Xarelto. She was admitted for elective cardioversion but spontaneously converted to sinus rhythm. She had MRI scan the brain done on 01/20/2014 which I have personally reviewed shows remote age right middle cerebral artery infarct with encephalomalacia and gliosis. There were moderate changes of chronic microvascular ischemia which appeared more advanced compared with previous MRI scan dated 03/07/2011. Carotid ultrasound dated 01/14/14  personally reviewed shows no significant except anastomosis and passed Doppler studies show mildly abnormal velocities in the right anterior cerebral artery and left middle cerebral artery of unclear significance. Update 02/03/2015 : She returns for follow-up after last visit in July 2015. She is doing much better with her migraines now and Topamax seems to be working. She is tolerating 50 mg morning and 100 at night without significant side effects. She remains on Xarelto which is causing some minor bruising but no bleeding episodes. She states her blood pressure is very well controlled and lipid profile last checked was fine. She's had no recurrent TIA or stroke symptoms. She continues to have atrial fibrillation off and on and recently has been started on Amiodarone which seems to be working. She had polysomnogram done on 04/21/14 which I have reviewed which shows no evidence of sleep apnea. Update 6/5/2017PS She returns for follow-up after last visit with me one year ago. She is doing well from stroke standpoint without recurrent stroke or TIA symptoms. She remains on Xarelto which is tolerating well but does have easy bruising and she is also on Plavix. She states her last lipid profile checked by primary physician was satisfactory in February this year. She was seen in the emergency room in November last year for episode of brief passing out. She had gone to [facility] and had been waiting for her ride outside when she felt lightheaded and faint and fell down and hit her head with brief loss of consciousness. Apparently her blood pressure was found to be low. CT scan of the brain and cervical spine did not show any fractures or acute abnormalities. She states she's done well since then without recurrent stroke or TIA symptoms. She however has noticed worsening of headaches for the last 6 months which now occur almost daily. The headache starts off in the neck and goes up to the back of the head. She describes  this as moderate in intensity since 6/10 at last all day. Tramadol does help in sleeping also helps. She does take 1 or 2 tablets of Tramadol every day. She remains on Topamax 50 g twice daily which is tolerating well without side effects. She has not had a severe migraine headache since starting Topamax. She does not do regular neck stretching exercises or any activities for stress laxation. She complains of some supersensitivity to smiles at times has her hearing sounds in the other room and she is in the bedroom. UPDATE 10/26/2017CM Tina. Malvin Johns, 60 year old female returns for follow-up. She has a history of stroke and is doing well from that standpoint without recurrent stroke or TIA symptoms. She remains on Xarelto and Plavix with minimal bruising and no bleeding. She is due to have her lipid profile checked in the next several weeks. She has a long history of migraines and is currently on Topamax 50 mg twice daily. Depakote 500 was added at her last visit with Dr. Pearlean Brownie. She was also given some Ultram acutely. She continues to complain of 1-2 headaches per week and one of her migraine triggers is lack  of a regular schedule in terms of sleep. She claims she does regular neck stretching exercises. She returns for reevaluation REVIEW OF SYSTEMS: Full 14 system review of systems performed and notable only for those listed, all others are neg:  Constitutional: neg  Cardiovascular: neg Ear/Nose/Throat: Hearing loss Skin: neg Eyes: neg Respiratory: neg Gastroitestinal: neg  Hematology/Lymphatic: neg  Endocrine: Intolerance to cold Musculoskeletal:neg Allergy/Immunology: neg Neurological: History of stroke, history of headaches Psychiatric: neg Sleep : neg   ALLERGIES: Pamelor, amoxicillin, hydrocodone oxycodone penicillin and sulfur, Avelox   HOME MEDICATIONS: Outpatient Medications Prior to Visit  Medication Sig Dispense Refill  . albuterol (PROVENTIL HFA;VENTOLIN HFA) 108 (90 BASE) MCG/ACT  inhaler Inhale 1 puff into the lungs every 6 (six) hours as needed for wheezing or shortness of breath.    [name] amiodarone (PACERONE) 200 MG tablet Take 1 tablet (200 mg total) by mouth daily. OVERDUE FOR APPT. PLEASE CALL AND SCHEDULE [phone] - 2nd attempt 30 tablet 0  . atorvastatin (LIPITOR) 20 MG tablet TAKE ONE TABLET BY MOUTH ONCE DAILY 90 tablet 2  . buPROPion (WELLBUTRIN XL) 150 MG 24 hr tablet Take 1 tablet (150 mg total) by mouth 3 (three) times daily. (Patient taking differently: Take 150 mg by mouth 2 (two) times daily. ) 90 tablet 5  . clopidogrel (PLAVIX) 75 MG tablet TAKE ONE TABLET BY MOUTH IN THE MORNING 30 tablet 0  . divalproex (DEPAKOTE ER) 500 MG 24 hr tablet Take 1 tablet (500 mg total) by mouth daily. 30 tablet 3  . fluticasone (FLONASE) 50 MCG/ACT nasal spray Place 2 sprays into both nostrils daily. 16 g 2  . KLOR-CON M20 20 MEQ tablet TAKE ONE TABLET BY MOUTH THREE TIMES DAILY 90 tablet 0  . levothyroxine (SYNTHROID, LEVOTHROID) 100 MCG tablet TAKE ONE TABLET BY MOUTH ONCE DAILY BEFORE BREAKFAST 30 tablet 3  . magnesium oxide (MAG-OX) 400 MG tablet Take 1 tablet (400 mg total) by mouth 2 (two) times daily. 60 tablet 1  . nitroGLYCERIN (NITROSTAT) 0.4 MG SL tablet Place 1 tablet (0.4 mg total) under the tongue every 5 (five) minutes as needed for chest pain. 90 tablet 3  . ondansetron (ZOFRAN-ODT) 4 MG disintegrating tablet DISSOLVE ONE TABLET IN MOUTH EVERY 8 HOURS AS NEEDED FOR NAUSEA 10 tablet 0  . rivaroxaban (XARELTO) 20 MG TABS tablet Take 1 tablet (20 mg total) by mouth daily with supper. 30 tablet 11  . tamsulosin (FLOMAX) 0.4 MG CAPS capsule Take 1 capsule (0.4 mg total) by mouth daily. 30 capsule 0  . topiramate (TOPAMAX) 50 MG tablet TAKE ONE TABLET BY MOUTH TWICE DAILY 180 tablet 5  . traMADol (ULTRAM) 50 MG tablet TAKE TWO TABLETS BY MOUTH EVERY 6 HOURS AS NEEDED FOR PAIN 30 tablet 0  . vitamin B-12 (CYANOCOBALAMIN) 1000 MCG tablet Take 1,000 mcg by mouth daily.      No facility-administered medications prior to visit.     PAST MEDICAL HISTORY: Past Medical History:  Diagnosis Date  . Acute right MCA stroke (HCC) 11/07/10  . Atrial fibrillation (HCC)    a. s/p TEE-DCCV 02/2104; b. Xarelto started  . CAD (coronary artery disease)    a.  cath 09/2010: LAD stent patent, S-Int/dCFX ok, S-PDA ok, L-LAD atretic;  b. Lexiscan Myoview (02/2014):  no ischemia, EF 55%; c. 11/2014 Cath/PCI: LM nl, LAD 20pISR, LCX 80-34m, OM1 nl, RI 70p, RCA 40-33m, RPDA 95ost/95-29m (PTCA only w/ reduction to 50p/54m), 60d, L->LAD atretic, VG->RI->OM nl, VG->PDA 100p.  . Cardiomyopathy  with EF 40% at TEE 02/17/14 (likely tachycardia mediated - Myoview 02/19/14 neg for ischemia with normal EF) 02/18/2014  . COPD (chronic obstructive pulmonary disease) (HCC)   . Depression   . Dysrhythmia 10/2014   atrial fib with rvr  . Eczema   . GERD (gastroesophageal reflux disease)   . Headache   . HLD (hyperlipidemia)   . Homelessness 11/12/2011  . 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 Hx of CABG   . Hx of transesophageal echocardiography (TEE) for monitoring 11/2010   TEE 11/2010: EF 60-65%, BAE, trivial atrial septal shunt;  right heart cath in 10/2010 with elevated R and L heart pressures and diuretic started  . Hypertension   . Hypothyroidism   . Pulmonary nodules    repeat CT due in 11/2011  . Sleep apnea    recent sleep study  in 04/2014 per chart review  shows no significant OSA    PAST SURGICAL HISTORY: Past Surgical History:  Procedure Laterality Date  . bilateral knee surgery    . BLADDER SURGERY    . CARDIOVERSION N/A 02/17/2014   Procedure: CARDIOVERSION;  Surgeon: Pricilla RifflePaula Ross V, MD;  [facility];  Service: Cardiovascular;  Laterality: N/A;  . cath 2012    . CHEST WALL RECONSTRUCTION    . CORONARY ARTERY BYPASS GRAFT    . debriment for infection in chest    . HERNIA REPAIR      . LEFT AND RIGHT HEART CATHETERIZATION WITH CORONARY ANGIOGRAM N/A 11/10/2014   Procedure: LEFT AND RIGHT HEART CATHETERIZATION WITH CORONARY ANGIOGRAM;  Surgeon: Marykay Lexavid W Harding, MD;  [facility];  Service: Cardiovascular;  Laterality: N/A;  . Left mastoidectomy    . TEE WITHOUT CARDIOVERSION N/A 02/17/2014   Procedure: TRANSESOPHAGEAL ECHOCARDIOGRAM (TEE);  Surgeon: Pricilla RifflePaula Ross V, MD;  [facility];  Service: Cardiovascular;  Laterality: N/A;    FAMILY HISTORY: Family History  Problem Relation Age of Onset  . COPD Mother   . Heart disease Mother   . Arthritis Mother     Rheumatoid and PMR  . Osteoporosis Mother     Mom fractured hip  . Diabetes type II Mother   . Depression Mother   . Heart attack Father   . Depression Father   . Hypertension Father   . Alcohol abuse Father   . Depression Sister   . Anxiety disorder Sister   . Drug abuse Sister   . Stroke Neg Hx     SOCIAL HISTORY: Social History   Social History  . Marital status: Divorced    Spouse name: N/A  . Number of children: 2  . Years of education: N/A   Occupational History  . DISABLE     Social History Main Topics  . Smoking status: Current Every Day Smoker    Packs/day: 0.25    Years: 30.00    Types: Cigarettes    Last attempt to quit: 09/10/2014  . Smokeless tobacco: Never Used  . Alcohol use No  . Drug use: No  . Sexual activity: Not Currently   Other Topics Concern  . Not on file   Social History Narrative   Divorced   After stroke was living with sister and mother after sister asked her to leave. Mom has since deceased    Difficulties over control.    Then moved back in  for about 6 weeks.   . tranportaion difficult son  helping   Ex-smoker   Was living at friends  Sister and her had argument and she was told to leave .    She was living in a homeless shelter for the last 3 weeks [facility]    son had help with transportation and medication       Work status regular   before got sick on short-term disability now lost t insurance after the stroke and couldn't work was  denied ssi    2 times.  She is not eligible for Medicaid because she doesn't have dependent children.         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   Lives in  rented house 2 house mates males keep tp self has her own b room  Doing well      PHYSICAL EXAM  Vitals:   07/04/16 1300  BP: (!) 152/100  Pulse: 64  Weight: 164 lb 3.2 oz (74.5 kg)  Height: 5\' 4"  (1.626 m)   Body mass index is 28.18 kg/m. General: well developed, well nourished middle-age Caucasian lady, seated, in no evident distress Head: head normocephalic and atraumatic.   Neck: supple with no carotid  Cardiovascular: regular rate and rhythm, no murmurs Musculoskeletal: no deformity Skin:  no rash/petichiae Vascular:  Normal pulses all extremities  Neurological examination  Mental Status: Awake and fully alert. Oriented to place and time. Recent and remote memory intact.  Attention span, concentration and fund of knowledge appropriate. Mood and affect appropriate.  Cranial Nerves: Fundoscopic exam not done  . Pupils equal, briskly reactive to light. Extraocular movements full without nystagmus. Visual fields full to confrontation. Hearing intact. Facial sensation intact. Mild left lower facial asymmetry.Tongue, palate moves normally and symmetrically.  Motor: Normal bulk and tone. Normal strength in all tested extremity muscles. Diminished fine finger movements on the left. Orbits right over left upper extremity.  Sensory.: intact to tough and pinprick and vibratory in the upper and lower extremities.  Coordination: Rapid alternating movements normal in all extremities. Finger-to-nose and heel-to-shin  performed accurately bilaterally. Gait and Station: Arises from chair without difficulty. Stance is normal. Gait demonstrates normal stride length and balance . Able to heel, toe and tandem walk without difficulty.  Reflexes: 1+ and symmetric. Toes downgoing.    DIAGNOSTIC DATA (LABS, IMAGING, TESTING) - I reviewed patient records, labs, notes, testing and imaging myself where available.  Lab Results  Component Value Date   WBC 5.1 07/19/2015   HGB 12.8 07/19/2015   HCT 37.4 07/19/2015   MCV 90.8 07/19/2015   PLT 184 07/19/2015      Component Value Date/Time   NA 144 02/28/2016 1354   K 4.1 02/28/2016 1354   CL 113 (H) 02/28/2016 1354   CO2 25 02/28/2016 1354   GLUCOSE 79 02/28/2016 1354   BUN 22 02/28/2016 1354   CREATININE 1.06 02/28/2016 1354   CREATININE 1.07 03/31/2015 1306   CALCIUM 8.3 (L) 02/28/2016 1354   PROT 6.7 05/31/2015 0919   ALBUMIN 3.8 05/31/2015 0919   AST 13 05/31/2015 0919   ALT 10 05/31/2015 0919   ALKPHOS 65 05/31/2015 0919   BILITOT 0.3 05/31/2015 0919   GFRNONAA 55 (L) 07/19/2015  1235   GFRNONAA 58 (L) 03/31/2015 1306   GFRAA >60 07/19/2015 1235   GFRAA 67 03/31/2015 1306   Lab Results  Component Value Date   CHOL 210 (H) 03/14/2015   HDL 51.00 03/14/2015   LDLCALC 143 (H) 03/14/2015   LDLDIRECT 153.0 01/15/2010   TRIG 82.0 03/14/2015   CHOLHDL 4 03/14/2015    Lab Results  Component Value Date   VITAMINB12 473 02/20/2016   Lab Results  Component Value Date   TSH 0.50 05/16/2015      ASSESSMENT AND PLAN 60year lady with long-standing history of migraine headaches with now mixed tension and migraine headaches which have increased in frequency and severity over the last 6 months And improved with Depakote. Remote history of right MCA infarct in February 2012 and TIA in June 2015 with mild residual left sided weakness. Multiple risk factors of hypertension, hyperlipidemia, coronary artery disease.[name]    PLAN: For tension headaches  recommend she do regular neck stretching exercises Increase  Depakote ER 500 mg to 2 daily Discussed not renewing tramadol at this time Continue Topamax 50 mg twice daily Xarelto for stroke prevention and Plavix  strict control of lipids with LDL cholesterol goal below 70 mg percent, continue Lipitor Hypertension with blood pressure goal below 130/90.  Importance of keeping a diary if headaches worsen to include the time of the headache what you're doing any other specific information that would be useful. Discussed stress relief techniques such as deep breathing muscle relaxation mental relaxation to music.  Discussed importance of exercise, regular meals  and sleep.  Sleep deprivation can be a migraine trigger Follow-up in 6 months Greater than 50% time during this 25 minute visit was spent on counseling and coordination of care about her headaches and stroke prevention and risk. She will return in 6 months Nilda Riggs, [facility], [facility], APRN  [facility] [address], [address] [place], [place] [address] [phone]

## 2016-07-04 NOTE — Patient Instructions (Signed)
For tension headaches recommend she do regular neck stretching exercises Increase  Depakote ER 500 mg to 2 daily Continue Topamax 50 mg twice daily Xarelto for stroke prevention and Plavix  strict control of lipids with LDL cholesterol goal below 70 mg percent, continue Lipitor Hypertension with blood pressure goal below 130/90.  Importance of keeping a diary if headaches worsen to include the time of the headache what you're doing any other specific information that would be useful. Discussed stress relief techniques such as deep breathing muscle relaxation mental relaxation to music.  Discussed importance of exercise, regular meals  and sleep.  Sleep deprivation can be a migraine trigger Follow-up in 6 months

## 2016-07-18 ENCOUNTER — Other Ambulatory Visit: Payer: Self-pay

## 2016-07-18 ENCOUNTER — Encounter: Payer: Self-pay | Admitting: Nurse Practitioner

## 2016-07-21 ENCOUNTER — Other Ambulatory Visit (HOSPITAL_COMMUNITY): Payer: Self-pay | Admitting: Psychiatry

## 2016-07-22 ENCOUNTER — Other Ambulatory Visit: Payer: Medicare Other

## 2016-07-23 ENCOUNTER — Other Ambulatory Visit: Payer: Self-pay | Admitting: Cardiology

## 2016-07-28 ENCOUNTER — Other Ambulatory Visit: Payer: Self-pay | Admitting: Nurse Practitioner

## 2016-07-28 ENCOUNTER — Other Ambulatory Visit: Payer: Self-pay | Admitting: Internal Medicine

## 2016-07-29 NOTE — Telephone Encounter (Signed)
Over 1 year since last TSH.  Please advise.

## 2016-07-30 ENCOUNTER — Telehealth: Payer: Self-pay | Admitting: Family Medicine

## 2016-07-30 ENCOUNTER — Other Ambulatory Visit (HOSPITAL_COMMUNITY): Payer: Self-pay | Admitting: Psychiatry

## 2016-07-30 ENCOUNTER — Other Ambulatory Visit: Payer: Self-pay | Admitting: Family Medicine

## 2016-07-30 DIAGNOSIS — D649 Anemia, unspecified: Secondary | ICD-10-CM

## 2016-07-30 DIAGNOSIS — E039 Hypothyroidism, unspecified: Secondary | ICD-10-CM

## 2016-07-30 DIAGNOSIS — Z7901 Long term (current) use of anticoagulants: Secondary | ICD-10-CM

## 2016-07-30 DIAGNOSIS — E538 Deficiency of other specified B group vitamins: Secondary | ICD-10-CM

## 2016-07-30 DIAGNOSIS — E7849 Other hyperlipidemia: Secondary | ICD-10-CM

## 2016-07-30 DIAGNOSIS — F331 Major depressive disorder, recurrent, moderate: Secondary | ICD-10-CM

## 2016-07-30 NOTE — Telephone Encounter (Signed)
Pt due for blood monitoring    Have her get TSH  Lipid  BMP and lfts and cbcdiff   b12 level  Dx  Thyroid lipids  gerd  Anticoagulation b12 deficiency   Refill thyroid med x  60 days     OV after lab results back to review  And fu   Lab Results  Component Value Date   WBC 5.1 07/19/2015   HGB 12.8 07/19/2015   HCT 37.4 07/19/2015   PLT 184 07/19/2015   GLUCOSE 79 02/28/2016   CHOL 210 (H) 03/14/2015   TRIG 82.0 03/14/2015   HDL 51.00 03/14/2015   LDLDIRECT 153.0 01/15/2010   LDLCALC 143 (H) 03/14/2015   ALT 10 05/31/2015   AST 13 05/31/2015   NA 144 02/28/2016   K 4.1 02/28/2016   CL 113 (H) 02/28/2016   CREATININE 1.06 02/28/2016   BUN 22 02/28/2016   CO2 25 02/28/2016   TSH 0.50 05/16/2015   INR 1.2 (H) 11/08/2014   HGBA1C  02/11/2011    5.4 (NOTE)                                                                       According to the ADA Clinical Practice Recommendations for 2011, when HbA1c is used as a screening test:   >=6.5%   Diagnostic of Diabetes Mellitus           (if abnormal result  is confirmed)  5.7-6.4%   Increased risk of developing Diabetes Mellitus  References:Diagnosis and Classification of Diabetes Mellitus,Diabetes Care,2011,34(Suppl 1):S62-S69 and Standards of Medical Care in         Diabetes - 2011,Diabetes Care,2011,34  (Suppl 1):S11-S61.

## 2016-07-30 NOTE — Telephone Encounter (Signed)
Pt is past due for fasting lab work and a follow up with WP.  Lab orders have been placed.  Please help the pt to make both appointments.  Thanks!!

## 2016-07-30 NOTE — Telephone Encounter (Signed)
Sent to the pharmacy by e-scribe for 2 months.  Lab orders placed in the system.  Message sent to scheduling.

## 2016-07-30 NOTE — Telephone Encounter (Signed)
Pt has been sch

## 2016-08-07 NOTE — Telephone Encounter (Signed)
Medication management - telephone message left for patient this nurse received her call requesting Dr. Donell Beers provide her with a Wellbutrin refill even though she has not been since 09/29/15 for last evaluation Informed patient on message this request would be sent to Dr. Donell Beers but that she would also need to call back to get on his schedule for first available and will contact her back once provider responds to request.

## 2016-08-07 NOTE — Telephone Encounter (Signed)
Medication management - Telephone call with patient to inform Dr. Donell Beers agreed to a one month supply of Wellbutrin but patient would need to call back for an appointment in December for further refills. Patient stated understanding and denied any current suicidal or homicidal ideations.  Informed order was e-scribed to her Therapist, occupational.  Patient to call back 08/08/16 to schedule first available in December.

## 2016-08-12 ENCOUNTER — Other Ambulatory Visit: Payer: Self-pay | Admitting: *Deleted

## 2016-08-12 DIAGNOSIS — G44209 Tension-type headache, unspecified, not intractable: Secondary | ICD-10-CM

## 2016-08-12 MED ORDER — DIVALPROEX SODIUM ER 500 MG PO TB24: 1000.0000 mg | ORAL_TABLET | Freq: Every day | ORAL | 1 refills | Status: DC

## 2016-08-13 ENCOUNTER — Other Ambulatory Visit: Payer: Self-pay | Admitting: Cardiology

## 2016-08-13 MED ORDER — AMIODARONE HCL 200 MG PO TABS: 200.0000 mg | ORAL_TABLET | Freq: Every day | ORAL | 0 refills | Status: DC

## 2016-08-13 MED ORDER — ATORVASTATIN CALCIUM 20 MG PO TABS
20.0000 mg | ORAL_TABLET | Freq: Every day | ORAL | 0 refills | Status: DC
Start: 2016-08-13 — End: 2016-10-01

## 2016-08-13 MED ORDER — RIVAROXABAN 20 MG PO TABS: 20.0000 mg | ORAL_TABLET | Freq: Every day | ORAL | 0 refills | Status: DC

## 2016-08-13 MED ORDER — POTASSIUM CHLORIDE CRYS ER 20 MEQ PO TBCR: 20.0000 meq | EXTENDED_RELEASE_TABLET | Freq: Three times a day (TID) | ORAL | 0 refills | Status: DC

## 2016-08-14 ENCOUNTER — Encounter (HOSPITAL_COMMUNITY): Payer: Self-pay | Admitting: Psychiatry

## 2016-08-14 ENCOUNTER — Other Ambulatory Visit: Payer: Self-pay | Admitting: Cardiology

## 2016-08-14 ENCOUNTER — Ambulatory Visit (INDEPENDENT_AMBULATORY_CARE_PROVIDER_SITE_OTHER): Payer: Self-pay | Admitting: Psychiatry

## 2016-08-14 VITALS — BP 148/82 | HR 72 | Ht 64.0 in | Wt 162.4 lb

## 2016-08-14 DIAGNOSIS — Z833 Family history of diabetes mellitus: Secondary | ICD-10-CM

## 2016-08-14 DIAGNOSIS — Z79899 Other long term (current) drug therapy: Secondary | ICD-10-CM

## 2016-08-14 DIAGNOSIS — Z9889 Other specified postprocedural states: Secondary | ICD-10-CM

## 2016-08-14 DIAGNOSIS — Z8249 Family history of ischemic heart disease and other diseases of the circulatory system: Secondary | ICD-10-CM

## 2016-08-14 DIAGNOSIS — F331 Major depressive disorder, recurrent, moderate: Secondary | ICD-10-CM

## 2016-08-14 DIAGNOSIS — Z8262 Family history of osteoporosis: Secondary | ICD-10-CM

## 2016-08-14 DIAGNOSIS — Z813 Family history of other psychoactive substance abuse and dependence: Secondary | ICD-10-CM

## 2016-08-14 DIAGNOSIS — F17210 Nicotine dependence, cigarettes, uncomplicated: Secondary | ICD-10-CM

## 2016-08-14 DIAGNOSIS — F339 Major depressive disorder, recurrent, unspecified: Secondary | ICD-10-CM

## 2016-08-14 DIAGNOSIS — Z8261 Family history of arthritis: Secondary | ICD-10-CM

## 2016-08-14 DIAGNOSIS — Z811 Family history of alcohol abuse and dependence: Secondary | ICD-10-CM

## 2016-08-14 DIAGNOSIS — F330 Major depressive disorder, recurrent, mild: Secondary | ICD-10-CM

## 2016-08-14 DIAGNOSIS — Z818 Family history of other mental and behavioral disorders: Secondary | ICD-10-CM

## 2016-08-14 MED ORDER — ESCITALOPRAM OXALATE 10 MG PO TABS: 10.0000 mg | ORAL_TABLET | Freq: Every day | ORAL | 2 refills | Status: DC

## 2016-08-14 MED ORDER — BUPROPION HCL ER (XL) 300 MG PO TB24: 150.0000 mg | ORAL_TABLET | Freq: Every morning | ORAL | 3 refills | Status: DC

## 2016-08-14 MED ORDER — CLOPIDOGREL BISULFATE 75 MG PO TABS: ORAL_TABLET | ORAL | 0 refills | Status: DC

## 2016-08-14 NOTE — Progress Notes (Signed)
[facility] Progress Note  08/14/2016 3:08 PM Laural BenesKathi G Mozer  MRN:  [id number] Subjective:  Fairly good Principal Problem: Major depression, recurrent mild Diagnosis:  Major depression, mild So for reasons that are confusing the patient has not been seen in over a year. She had a return appointment after her appointment in October 2016 in January but she could not make it. I believe either she canceled her we canceled but she was never reconnected. She's continue taking the Wellbutrin that was prescribed but when she tried to take 450 she had visual hallucinations. Shears self therefore reduced it to 300 mg and has no hallucinations now. Problem is she still feels quite depressed. She feels depressed for 5 days out of the week. Her sleep is disturbed because of environmental issue and that she has to take care of her sons all she has to go over to her son to take care of the dog and going over his important for her because she gets to see her son and because she gets to eat. Patient says her appetite is normal. She is having problems with concentration. She says she has no energy but is not clear that related to depression or sleep deprivation. The patient denies anhedonia. She loves seeing the dog reading and watching TV. She is no problems concentrating when she watches TV but otherwise she says it is limited. She is not suicidal. The patient is treatment for migraine headaches and changed. She no longer takes tramadol. She only takes Topamax and for a while she took some Depakote but had side effects. Patient denies the use of alcohol or drugs. Unfortunately this visit was a 15 minute visit in this patient that I would like to spend a full session with. She'll return in 7 weeks and will do just that. Fortunately now she is not suicidal not psychotic and she is functioning fairly well. The patient is not distressed. Patient Active Problem List   Diagnosis Date Noted  . Generalized headaches [R51] 07/04/2016   . Protein-calorie malnutrition, severe (HCC) [E43] 04/03/2015  . Chronic diastolic CHF (congestive heart failure), NYHA class 2 (HCC) [I50.32] 01/24/2015  . Demand ischemia secondary to AF with RVR [I24.8] 12/13/2014  . CKD (chronic kidney disease), stage III [N18.3] 11/11/2014  . Hypokalemia [E87.6] 05/22/2014  . Mood disorder (HCC) [F39] 03/20/2014  . HTN (hypertension) [I10] 03/20/2014  . Anemia, unspecified [D64.9] 03/20/2014  . Tobacco use disorder [F17.200] 03/20/2014  . Atrial fibrillation with RVR (HCC) [I48.91] 03/03/2014  . Hypotension [I95.9] 03/03/2014  . Pulmonary hypertension [I27.20] 03/03/2014  . COPD (chronic obstructive pulmonary disease) (HCC) [J44.9] 03/03/2014  . History of right MCA stroke [Z86.73]   . Long-term (current) use of anticoagulants [Z79.01]   . 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 Hyperlipidemia [E78.5] 02/18/2014  . CAD '07, LAD PCI 2012, SVG-PDA PTCA 11/10/14 [I25.10] 02/16/2014  . Chronic diastolic heart failure (HCC) [I50.32]   . Pulmonary nodules [R91.8]   . Hypothyroidism [E03.9]   . OSA- C-pap intol [G47.33]    Total Time spent with patient: 30 minutes  Past Psychiatric History:   Past Medical History:  Past Medical History:  Diagnosis Date  . Acute right MCA stroke (HCC) 11/07/10  . Atrial fibrillation (HCC)    a. s/p TEE-DCCV 02/2104; b. Xarelto started  . CAD (coronary artery disease)    a.  cath 09/2010: LAD stent patent, S-Int/dCFX ok, S-PDA ok, L-LAD atretic;  b. Lexiscan Myoview (02/2014):  no ischemia, EF 55%; c. 11/2014 Cath/PCI: LM nl, LAD 20pISR, LCX 80-18m, OM1 nl, RI 70p, RCA 40-61m, RPDA 95ost/95-83m (PTCA only w/ reduction to 50p/60m), 60d, L->LAD atretic, VG->RI->OM nl, VG->PDA 100p.  . Cardiomyopathy with EF 40% at TEE 02/17/14 (likely tachycardia mediated - Myoview 02/19/14 neg for ischemia with normal EF) 02/18/2014  . COPD (chronic obstructive pulmonary disease) (HCC)   .  Depression   . Dysrhythmia 10/2014   atrial fib with rvr  . Eczema   . GERD (gastroesophageal reflux disease)   . Headache   . HLD (hyperlipidemia)   . Homelessness 11/12/2011  . 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 Hx of CABG   . Hx of transesophageal echocardiography (TEE) for monitoring 11/2010   TEE 11/2010: EF 60-65%, BAE, trivial atrial septal shunt;  right heart cath in 10/2010 with elevated R and L heart pressures and diuretic started  . Hypertension   . Hypothyroidism   . Pulmonary nodules    repeat CT due in 11/2011  . Sleep apnea    recent sleep study  in 04/2014 per chart review  shows no significant OSA    Past Surgical History:  Procedure Laterality Date  . bilateral knee surgery    . BLADDER SURGERY    . CARDIOVERSION N/A 02/17/2014   Procedure: CARDIOVERSION;  Surgeon: Pricilla Riffle, MD;  Location: [facility] ENDOSCOPY;  Service: Cardiovascular;  Laterality: N/A;  . cath 2012    . CHEST WALL RECONSTRUCTION    . CORONARY ARTERY BYPASS GRAFT    . debriment for infection in chest    . HERNIA REPAIR    . LEFT AND RIGHT HEART CATHETERIZATION WITH CORONARY ANGIOGRAM N/A 11/10/2014   Procedure: LEFT AND RIGHT HEART CATHETERIZATION WITH CORONARY ANGIOGRAM;  Surgeon: Marykay Lex, MD;  Location: [facility] CATH LAB;  Service: Cardiovascular;  Laterality: N/A;  . Left mastoidectomy    . TEE WITHOUT CARDIOVERSION N/A 02/17/2014   Procedure: TRANSESOPHAGEAL ECHOCARDIOGRAM (TEE);  Surgeon: Pricilla Riffle, MD;  Location: [facility] ENDOSCOPY;  Service: Cardiovascular;  Laterality: N/A;   Family History:  Family History  Problem Relation Age of Onset  . COPD Mother   . Heart disease Mother   . Arthritis Mother     Rheumatoid and PMR  . Osteoporosis Mother     Mom fractured hip  . Diabetes type II Mother   . Depression Mother   . Heart attack Father   . Depression Father   . Hypertension Father   . Alcohol abuse  Father   . Depression Sister   . Anxiety disorder Sister   . Drug abuse Sister   . Stroke Neg Hx    Family Psychiatric  History:  Social History:  History  Alcohol Use No     History  Drug Use No    Social History   Social History  . Marital status: Divorced    Spouse name: N/A  . Number of children: 2  . Years of education: N/A   Occupational History  . DISABLE     Social History Main Topics  . Smoking status: Current Every Day Smoker    Packs/day: 0.25    Years: 30.00    Types: Cigarettes    Last attempt to quit: 09/10/2014  . Smokeless tobacco: Never Used  . Alcohol use No  . Drug use: No  .  Sexual activity: Not Currently   Other Topics Concern  . None   Social History Narrative   Divorced   After stroke was living with sister and mother after sister asked her to leave. Mom has since deceased    Difficulties over control.    Then moved back in for about 6 weeks.   . tranportaion difficult son  helping   Ex-smoker   Was living at friends  Sister and her had argument and she was told to leave .    She was living in a homeless shelter for the last 3 weeks [facility]    son had help with transportation and medication       Work status regular  before got sick on short-term disability now lost t insurance after the stroke and couldn't work was  denied ssi    2 times.  She is not eligible for Medicaid because she doesn't have dependent children.         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   Lives in  rented house 2 house mates males keep tp self has her own b room  Doing well    Additional Social History:                         Sleep: Good  Appetite:  Good  Current Medications: Current Outpatient Prescriptions  Medication Sig  Dispense Refill  . albuterol (PROVENTIL HFA;VENTOLIN HFA) 108 (90 BASE) MCG/ACT inhaler Inhale 1 puff into the lungs every 6 (six) hours as needed for wheezing or shortness of breath.    [name] amiodarone (PACERONE) 200 MG tablet Take 1 tablet (200 mg total) by mouth daily. 90 tablet 0  . atorvastatin (LIPITOR) 20 MG tablet Take 1 tablet (20 mg total) by mouth daily. 90 tablet 0  . buPROPion (WELLBUTRIN XL) 300 MG 24 hr tablet Take 1 tablet (300 mg total) by mouth every morning. 30 tablet 3  . clopidogrel (PLAVIX) 75 MG tablet TAKE ONE TABLET BY MOUTH ONCE DAILY IN THE MORNING 90 tablet 0  . divalproex (DEPAKOTE ER) 500 MG 24 hr tablet Take 2 tablets (1,000 mg total) by mouth daily. 180 tablet 1  . escitalopram (LEXAPRO) 10 MG tablet Take 1 tablet (10 mg total) by mouth daily. 30 tablet 2  . fluticasone (FLONASE) 50 MCG/ACT nasal spray Place 2 sprays into both nostrils daily. 16 g 2  . levothyroxine (SYNTHROID, LEVOTHROID) 100 MCG tablet TAKE ONE TABLET BY MOUTH ONCE DAILY BEFORE BREAKFAST 30 tablet 1  . magnesium oxide (MAG-OX) 400 MG tablet Take 1 tablet (400 mg total) by mouth 2 (two) times daily. 60 tablet 1  . nitroGLYCERIN (NITROSTAT) 0.4 MG SL tablet Place 1 tablet (0.4 mg total) under the tongue every 5 (five) minutes as needed for chest pain. 90 tablet 3  . ondansetron (ZOFRAN-ODT) 4 MG disintegrating tablet DISSOLVE ONE TABLET IN MOUTH EVERY 8 HOURS AS NEEDED FOR NAUSEA 10 tablet 0  . potassium chloride SA (KLOR-CON M20) 20 MEQ tablet Take 1 tablet (20 mEq total) by mouth 3 (three) times daily. 90 tablet 0  . rivaroxaban (XARELTO) 20 MG TABS tablet Take 1 tablet (20 mg total) by mouth daily with supper. 90 tablet  0  . tamsulosin (FLOMAX) 0.4 MG CAPS capsule Take 1 capsule (0.4 mg total) by mouth daily. 30 capsule 0  . topiramate (TOPAMAX) 50 MG tablet TAKE ONE TABLET BY MOUTH TWICE DAILY 180 tablet 5  . traMADol (ULTRAM) 50 MG tablet TAKE TWO TABLETS BY MOUTH EVERY 6 HOURS AS NEEDED FOR PAIN 30  tablet 0  . vitamin B-12 (CYANOCOBALAMIN) 1000 MCG tablet Take 1,000 mcg by mouth daily.     No current facility-administered medications for this visit.     Lab Results: No results found for this or any previous visit (from the past 48 hour(s)).  Physical Findings: AIMS:  , ,  ,  ,    CIWA:    COWS:     Musculoskeletal: Strength & Muscle Tone: within normal limits Gait & Station: normal Patient leans: Right  Psychiatric Specialty Exam: ROS  Blood pressure (!) 148/82, pulse 72, height 5\' 4"  (1.626 m), weight 162 lb 6.4 oz (73.7 kg).Body mass index is 27.88 kg/m.  General Appearance: NA  Eye Contact::  Good  Speech:  Clear and Coherent  Volume:  Normal  Mood:  NA  Affect:  Appropriate  Thought Process:  Coherent  Orientation:  NA  Thought Content:  WDL  Suicidal Thoughts:  No  Homicidal Thoughts:  No  Memory:  NA  Judgement:  Good  Insight:  Good  Psychomotor Activity:  Normal  Concentration:  Good  Recall:  Good  Fund of Knowledge:Good  Language: Good  Akathisia:  No  Handed:  Right  AIMS (if indicated):     Assets:  Desire for Improvement  ADL's:  Intact  Cognition: WNL  Sleep:      Treatment Plan Summary: At this time the patient does acknowledge that the Wellbutrin has helped to some degree but not enough. At this time I choose to continue the 300 mg dose. She'll take a single 300 mg pill each morning. Today we'll go ahead and add Lexapro 10 mg to her medicines. She's never been on Lexapro before and certainly never been on Lexapro together with Wellbutrin. This is the only medication I'll be giving her. She'll return to see me in 7 weeks where she'll have a full visit we'll we will review her diagnosis and her treatment plan. The patient is not suicidal. She denies any chest discomfort or shortness of breath or any neurological symptoms. She does have chronic headaches but apparently it's better on Topamax. [place] Abbey Veith 08/14/2016, 3:08 PM

## 2016-08-15 ENCOUNTER — Other Ambulatory Visit: Payer: Self-pay

## 2016-08-15 MED ORDER — FLUTICASONE PROPIONATE 50 MCG/ACT NA SUSP: 2.0000 | Freq: Every day | NASAL | 1 refills | Status: DC

## 2016-08-15 MED ORDER — MAGNESIUM OXIDE 400 MG PO TABS: 400.0000 mg | ORAL_TABLET | Freq: Two times a day (BID) | ORAL | 1 refills | Status: DC

## 2016-08-15 MED ORDER — LEVOTHYROXINE SODIUM 100 MCG PO TABS: ORAL_TABLET | ORAL | 1 refills | Status: DC

## 2016-08-16 ENCOUNTER — Other Ambulatory Visit: Payer: Self-pay

## 2016-08-16 MED ORDER — TOPIRAMATE 50 MG PO TABS
50.0000 mg | ORAL_TABLET | Freq: Two times a day (BID) | ORAL | 5 refills | Status: DC
Start: 2016-08-16 — End: 2018-03-18

## 2016-08-23 ENCOUNTER — Other Ambulatory Visit: Payer: Self-pay

## 2016-08-23 ENCOUNTER — Encounter: Payer: Self-pay | Admitting: Cardiology

## 2016-08-30 ENCOUNTER — Ambulatory Visit: Payer: Self-pay | Admitting: Internal Medicine

## 2016-09-13 ENCOUNTER — Encounter: Payer: Self-pay | Admitting: *Deleted

## 2016-09-13 ENCOUNTER — Ambulatory Visit (INDEPENDENT_AMBULATORY_CARE_PROVIDER_SITE_OTHER): Payer: Medicare Other | Admitting: Cardiology

## 2016-09-13 VITALS — BP 126/76 | HR 92 | Ht 64.0 in | Wt 162.0 lb

## 2016-09-13 DIAGNOSIS — Z01812 Encounter for preprocedural laboratory examination: Secondary | ICD-10-CM

## 2016-09-13 DIAGNOSIS — I25810 Atherosclerosis of coronary artery bypass graft(s) without angina pectoris: Secondary | ICD-10-CM

## 2016-09-13 DIAGNOSIS — E782 Mixed hyperlipidemia: Secondary | ICD-10-CM

## 2016-09-13 DIAGNOSIS — I4891 Unspecified atrial fibrillation: Secondary | ICD-10-CM | POA: Diagnosis not present

## 2016-09-13 DIAGNOSIS — Z7901 Long term (current) use of anticoagulants: Secondary | ICD-10-CM | POA: Diagnosis not present

## 2016-09-13 DIAGNOSIS — I480 Paroxysmal atrial fibrillation: Secondary | ICD-10-CM

## 2016-09-13 DIAGNOSIS — Z515 Encounter for palliative care: Secondary | ICD-10-CM | POA: Insufficient documentation

## 2016-09-13 DIAGNOSIS — I10 Essential (primary) hypertension: Secondary | ICD-10-CM | POA: Diagnosis not present

## 2016-09-13 DIAGNOSIS — E032 Hypothyroidism due to medicaments and other exogenous substances: Secondary | ICD-10-CM

## 2016-09-13 MED ORDER — FUROSEMIDE 20 MG PO TABS: 20.0000 mg | ORAL_TABLET | Freq: Every day | ORAL | 3 refills | Status: DC

## 2016-09-13 NOTE — Patient Instructions (Addendum)
Medication Instructions:   START TAKING LASIX 20 MG ONCE DAILY    Labwork:  TODAY--PRE-CARDIOVERSION LABS--CHECK---CMET, PT/INR, CBC W DIFF, TSH, AND MAGNESIUM    Testing/Procedures:  Your physician has recommended that you have a pulmonary function test. Pulmonary Function Tests are a group of tests that measure how well air moves in and out of your lungs.   Your physician has requested that you have a Cardioversion.  Electrical Cardioversion uses a jolt of electricity to your heart either through paddles or wired patches attached to your chest. This is a controlled, usually prescheduled, procedure. This procedure is done at the hospital and you are not awake during the procedure. You usually go home the day of the procedure. Please see the instruction sheet given to you today for more information.  YOUR CARDIOVERSION IS SCHEDULED FOR NEXT Friday 09/20/16 AT 11:30 AM FOR DR NELSON TO DO---PLEASE ARRIVE AT [place] AT 11:30 AM.  PLEASE FOLLOW INSTRUCTIONS PROVIDED.       Follow-Up:  1 MONTH WITH DR NELSON IF AN OPENING IS AVAILABLE OR WITH AN EXTENDER ON HER TEAM   A NEW PATIENT APPOINTMENT WITH DR Johney Frame FOR ATRIAL FIBRILLATION AND TO DISCUSS NEEDING A POSSIBLE ABLATION       If you need a refill on your cardiac medications before your next appointment, please call your pharmacy.

## 2016-09-13 NOTE — Progress Notes (Signed)
Patient ID: [name], female   DOB: 1956-06-15, 61 y.o.   MRN: [id number]      Cardiology Office Note   Date:  09/13/2016   ID:  [name], DOB Aug 12, 1956, MRN [id number]  PCP:  [name], MD  Cardiologist:  Dr. Tobias Alexander    Chief complain: Fatigue   History of Present Illness: [name] is a 61 y.o. female with a hx of CAD s/p CABG and subsequent stenting of the native LAD (LIMA-LAD found to be atretic), PAF (on amiodarone and Xarelto), chronic combined systolic and diastolic HF (EF 78% in 2015 >> improved to 65-70% in 3/16), HTN, HL, CKD, OSA, CVA in 2012, COPD, bradycardia.    Evaluated by Dr. Tobias Alexander 11/2014 for progressively worsening DOE.  R/L heart cath was arranged.  She was admitted 3/3-3/4. Cardiac catheterization demonstrated patent LAD stent, SVG-RI/OM patent, SVG-PDA occluded (new finding), severe distal PDA disease treated with balloon angioplasty only (This was suboptimal as balloon was not fully expanded).  Because she is on chronic xarelto therapy, she was discharged on aspirin, Plavix, and xarelto. She will need to discontinue aspirin after one month and continue Plavix and xarelto after that time.  Readmitted 4/2-4/6 with atrial fibrillation with RVR. She had run out of amiodarone 4 days prior to admission. She was hypokalemic with a potassium less than 3. She was placed on IV amiodarone. Potassium was replaced. Patient returned to NSR and she was transitioned back to oral amiodarone. She was placed on spironolactone to help with achieving normal potassium levels. Hospitalization was complicated by hypotension as well as worsening renal insufficiency felt to be related to hypoperfusion from AF with RVR.  Troponins were elevated. As the patient had incomplete dilation of the PDA during previous angioplasty, this was not felt to be a surprise in the setting of atrial fibrillation with RVR. This was felt to be demand ischemia. Medical therapy was  continued. Aspirin was stopped and she was >30 days post PCI.    05/31/15 - 5 months follow up, she feels significantly better, she has no CP and denies SOB, also her dizziness and orthostatic hypotension has improved. She is able to walk with a cane. She was admitted in July with hypothyroidism, hypokalemia and hypomagnesemia. Synthroid was increased then. On 05/16/2015 TSH was 0.5, but she was off synthroid for a while.  Recently she was not able to use her kitchen and developed acute renal insufficiency. Now eating/drinking well. Denies orthopnea, PND. No bleeding. No muscle pain.  09/13/2016 - the patient is coming after 59 months, she has been compliant with her medicines and denies any side effects. She denies any bleeding and has been compliant with Xarelto. She denies any chest pain or dyspnea on exertion as well as lower extremity edema however has noticed worsening fatigue. She denies any palpitations or syncope.  Studies/Reports Reviewed Today:  LHC/PCI 11/10/14 LM: ok LAD: stent in the proximal segment that is roughly 20% in-stent restenosis. RI: 70% proximal disease.  LCx:  80-90% stenosis in the mid AV groove segment RCA: prox 40%, mid 40-50%, RPDA ostial 95% then 95-99% then 60% L-LAD:  Atretic (old) S-RI/OM:  Patent  S-RPDA:  prox 100% PCI:  Suboptimal PTCA only of the ostial RPDA and mid RPDA leading to the anastomosis site. Unable to fully expand the balloon.  Echo 11/08/14 - Mild focal basal hypertrophy of the septum. LVF vigorous. EF 65% to 70%.  HK basalinferior myocardium. Grade 1 diastolic dysfunction. -  Aortic valve: There was trivial regurgitation. - Left atrium: The atrium was mildly dilated. Impressions: Compared to the prior study, there has been no significant interval change.  Myoview 02/19/14 IMPRESSION: No evidence for reversibility or myocardial ischemia.  Calculated ejection fraction is 55%.  Carotid US 5/15 No sig ICA stenosis   Past Medical History:    Diagnosis Date  . Acute right MCA stroke (HCC) 11/07/10  . Atrial fibrillation (HCC)    a. s/p TEE-DCCV 02/2104; b. Xarelto started  . CAD (coronary artery disease)    a.  cath 09/2010: LAD stent patent, S-Int/dCFX ok, S-PDA ok, L-LAD atretic;  b. Lexiscan Myoview (02/2014):  no ischemia, EF 55%; c. 11/2014 Cath/PCI: LM nl, LAD 20pISR, LCX 80-81m, OM1 nl, RI 70p, RCA 40-55m, RPDA 95ost/95-61m (PTCA only w/ reduction to 50p/73m), 60d, L->LAD atretic, VG->RI->OM nl, VG->PDA 100p.  . Cardiomyopathy with EF 40% at TEE 02/17/14 (likely tachycardia mediated - Myoview 02/19/14 neg for ischemia with normal EF) 02/18/2014  . COPD (chronic obstructive pulmonary disease) (HCC)   . Depression   . Dysrhythmia 10/2014   atrial fib with rvr  . Eczema   . GERD (gastroesophageal reflux disease)   . Headache   . HLD (hyperlipidemia)   . Homelessness 11/12/2011  . 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 Hx of CABG   . Hx of transesophageal echocardiography (TEE) for monitoring 11/2010   TEE 11/2010: EF 60-65%, BAE, trivial atrial septal shunt;  right heart cath in 10/2010 with elevated R and L heart pressures and diuretic started  . Hypertension   . Hypothyroidism   . Pulmonary nodules    repeat CT due in 11/2011  . Sleep apnea    recent sleep study  in 04/2014 per chart review  shows no significant OSA    Past Surgical History:  Procedure Laterality Date  . bilateral knee surgery    . BLADDER SURGERY    . CARDIOVERSION N/A 02/17/2014   Procedure: CARDIOVERSION;  Surgeon: Pricilla Riffle, MD;  Location: [facility] ENDOSCOPY;  Service: Cardiovascular;  Laterality: N/A;  . cath 2012    . CHEST WALL RECONSTRUCTION    . CORONARY ARTERY BYPASS GRAFT    . debriment for infection in chest    . HERNIA REPAIR    . LEFT AND RIGHT HEART CATHETERIZATION WITH CORONARY ANGIOGRAM N/A 11/10/2014   Procedure: LEFT AND RIGHT HEART CATHETERIZATION WITH  CORONARY ANGIOGRAM;  Surgeon: Marykay Lex, MD;  Location: [facility] CATH LAB;  Service: Cardiovascular;  Laterality: N/A;  . Left mastoidectomy    . TEE WITHOUT CARDIOVERSION N/A 02/17/2014   Procedure: TRANSESOPHAGEAL ECHOCARDIOGRAM (TEE);  Surgeon: Pricilla Riffle, MD;  Location: [facility] ENDOSCOPY;  Service: Cardiovascular;  Laterality: N/A;     Current Outpatient Prescriptions  Medication Sig Dispense Refill  . albuterol (PROVENTIL HFA;VENTOLIN HFA) 108 (90 BASE) MCG/ACT inhaler Inhale 1 puff into the lungs every 6 (six) hours as needed for wheezing or shortness of breath.    [name] amiodarone (PACERONE) 200 MG tablet Take 1 tablet (200 mg total) by mouth daily. 90 tablet 0  . atorvastatin (LIPITOR) 20 MG tablet Take 1 tablet (20 mg total) by mouth daily. 90 tablet 0  . buPROPion (WELLBUTRIN XL) 300 MG 24 hr tablet Take 1 tablet (300 mg total) by mouth every morning. 30 tablet 3  . clopidogrel (PLAVIX) 75 MG  tablet TAKE ONE TABLET BY MOUTH ONCE DAILY IN THE MORNING 90 tablet 0  . divalproex (DEPAKOTE ER) 500 MG 24 hr tablet Take 2 tablets (1,000 mg total) by mouth daily. (Patient taking differently: Take 500 mg by mouth daily. ) 180 tablet 1  . escitalopram (LEXAPRO) 10 MG tablet Take 1 tablet (10 mg total) by mouth daily. 30 tablet 2  . fluticasone (FLONASE) 50 MCG/ACT nasal spray Place 2 sprays into both nostrils daily. 48 g 1  . levothyroxine (SYNTHROID, LEVOTHROID) 100 MCG tablet TAKE ONE TABLET BY MOUTH ONCE DAILY BEFORE BREAKFAST 90 tablet 1  . magnesium oxide (MAG-OX) 400 MG tablet Take 1 tablet (400 mg total) by mouth 2 (two) times daily. 180 tablet 1  . nitroGLYCERIN (NITROSTAT) 0.4 MG SL tablet Place 1 tablet (0.4 mg total) under the tongue every 5 (five) minutes as needed for chest pain. 90 tablet 3  . ondansetron (ZOFRAN-ODT) 4 MG disintegrating tablet DISSOLVE ONE TABLET IN MOUTH EVERY 8 HOURS AS NEEDED FOR NAUSEA 10 tablet 0  . potassium chloride SA (KLOR-CON M20) 20 MEQ tablet Take 1 tablet (20  mEq total) by mouth 3 (three) times daily. 90 tablet 0  . rivaroxaban (XARELTO) 20 MG TABS tablet Take 1 tablet (20 mg total) by mouth daily with supper. 90 tablet 0  . tamsulosin (FLOMAX) 0.4 MG CAPS capsule Take 1 capsule (0.4 mg total) by mouth daily. 30 capsule 0  . topiramate (TOPAMAX) 50 MG tablet Take 1 tablet (50 mg total) by mouth 2 (two) times daily. 180 tablet 5  . traMADol (ULTRAM) 50 MG tablet TAKE TWO TABLETS BY MOUTH EVERY 6 HOURS AS NEEDED FOR PAIN 30 tablet 0  . vitamin B-12 (CYANOCOBALAMIN) 1000 MCG tablet Take 1,000 mcg by mouth daily.     No current facility-administered medications for this visit.     Allergies:   Avelox [moxifloxacin hcl in nacl]; Pamelor [nortriptyline hcl]; Amoxicillin; Hydrocodone; Oxycodone; Penicillins; and Sulfa antibiotics    Social History:  The patient  reports that she has been smoking Cigarettes.  She has a 7.50 pack-year smoking history. She has never used smokeless tobacco. She reports that she does not drink alcohol or use drugs.   Family History:  The patient's family history includes Alcohol abuse in her father; Anxiety disorder in her sister; Arthritis in her mother; COPD in her mother; Depression in her father, mother, and sister; Diabetes type II in her mother; Drug abuse in her sister; Heart attack in her father; Heart disease in her mother; Hypertension in her father; Osteoporosis in her mother.    ROS:   Please see the history of present illness.   Review of Systems  Hematologic/Lymphatic: Bruises/bleeds easily.  Gastrointestinal: Positive for constipation.  Genitourinary: Positive for urgency.  Psychiatric/Behavioral: Positive for depression.  All other systems reviewed and are negative.    PHYSICAL EXAM: VS:  BP 126/76   Pulse 92   Ht 5\' 4"  (1.626 m)   Wt 162 lb (73.5 kg)   BMI 27.81 kg/m     No data found.    Wt Readings from Last 3 Encounters:  09/13/16 162 lb (73.5 kg)  07/04/16 164 lb 3.2 oz (74.5 kg)    02/20/16 149 lb 1.6 oz (67.6 kg)     GEN: Well nourished, well developed, in no acute distress  HEENT: normal  Neck: no JVD,  no masses Cardiac:  Normal S1/S2, RRR; no murmur ,  no rubs or gallops, no edema  R groin  without hematoma or bruit   Respiratory:  clear to auscultation bilaterally, no wheezing, rhonchi or rales. GI: soft, nontender, nondistended, + BS MS: no deformity or atrophy  Skin: warm and dry  Neuro:  CNs II-XII intact, Strength and sensation are intact Psych: Normal affect   EKG:  EKG is ordered today.  It demonstrates:   NSR, HR 78, normal axis, inferolateral T-wave inversions, no significant change compared to prior tracing QTc 421   Recent Labs: 02/28/2016: BUN 22; Creatinine, Ser 1.06; Magnesium 1.1; Potassium 4.1; Sodium 144    Lipid Panel    Component Value Date/Time   CHOL 210 (H) 03/14/2015 1159   TRIG 82.0 03/14/2015 1159   HDL 51.00 03/14/2015 1159   CHOLHDL 4 03/14/2015 1159   VLDL 16.4 03/14/2015 1159   LDLCALC 143 (H) 03/14/2015 1159   LDLDIRECT 153.0 01/15/2010 1501   TTE 11/2014 Left ventricle: The cavity size was normal. There was mild focal basal hypertrophy of the septum. Systolic function was vigorous. The estimated ejection fraction was in the range of 65% to 70%. There is hypokinesis of the basalinferior myocardium. Doppler parameters are consistent with abnormal left ventricular relaxation (grade 1 diastolic dysfunction). - Aortic valve: There was trivial regurgitation. - Left atrium: The atrium was mildly dilated.  Impressions:  - Compared to the prior study, there has been no significant interval change.    ASSESSMENT AND PLAN:  1. PAF - The patient is back in atrial fibrillation despite being on amiodarone, she feels tired, will schedule outpatient cardioversion for September 20, 2016 with me. She has been compliant with Xarelto and has no side effects. The patient appears short of breath at rest she states it  secondary to having stuffy nose. She has been on amiodarone for 2 years now we will check her PFTs. We will refer her to an AP clinic for consideration of atrial fibrillation ablation.  2. Coronary artery disease involving autologous vein coronary bypass graft without angina pectoris Improved symptoms. ECG shows unchanged negative T waves in the anterolateral and inferior leads.   -  Continue Plavix in addition to Xarelto, statin.  ASA was stopped 30 days post PCI.  - unable to add betablocker as she is bradycardic  - Ischemic workup at this time.  3. Orthostatic hypotension - resolved after holding diuretics  4. Essential hypertension Controlled.  5. Hyperlipidemia Continue atorvastatin 20 mg po daily. Check LFTs today.  6. Hypothyrodism - recheck TSH today.   Check CBC, CMP, TSH today schedule for cardioversion on 09/20/2016, follow up in 4 weeks.   Tobias Alexander, MD 09/13/2016

## 2016-09-14 LAB — CBC WITH DIFFERENTIAL/PLATELET
Basophils Absolute: 0 10*3/uL (ref 0.0–0.2)
Basos: 0 %
EOS (ABSOLUTE): 0.2 10*3/uL (ref 0.0–0.4)
Eos: 4 %
Hematocrit: 37.9 % (ref 34.0–46.6)
Hemoglobin: 12.8 g/dL (ref 11.1–15.9)
Immature Grans (Abs): 0 10*3/uL (ref 0.0–0.1)
Immature Granulocytes: 0 %
Lymphocytes Absolute: 1.6 10*3/uL (ref 0.7–3.1)
Lymphs: 36 %
MCH: 32.1 pg (ref 26.6–33.0)
MCHC: 33.8 g/dL (ref 31.5–35.7)
MCV: 95 fL (ref 79–97)
Monocytes Absolute: 0.5 10*3/uL (ref 0.1–0.9)
Monocytes: 11 %
Neutrophils Absolute: 2.2 10*3/uL (ref 1.4–7.0)
Neutrophils: 49 %
Platelets: 201 10*3/uL (ref 150–379)
RBC: 3.99 x10E6/uL (ref 3.77–5.28)
RDW: 14.4 % (ref 12.3–15.4)
WBC: 4.5 10*3/uL (ref 3.4–10.8)

## 2016-09-14 LAB — COMPREHENSIVE METABOLIC PANEL
ALT: 7 IU/L (ref 0–32)
AST: 10 IU/L (ref 0–40)
Albumin/Globulin Ratio: 1.6 (ref 1.2–2.2)
Albumin: 3.9 g/dL (ref 3.5–5.5)
Alkaline Phosphatase: 80 IU/L (ref 39–117)
BUN/Creatinine Ratio: 9 (ref 9–23)
BUN: 12 mg/dL (ref 6–24)
Bilirubin Total: 0.2 mg/dL (ref 0.0–1.2)
CO2: 22 mmol/L (ref 18–29)
Calcium: 8.6 mg/dL — ABNORMAL LOW (ref 8.7–10.2)
Chloride: 104 mmol/L (ref 96–106)
Creatinine, Ser: 1.29 mg/dL — ABNORMAL HIGH (ref 0.57–1.00)
GFR calc Af Amer: 53 mL/min/{1.73_m2} — ABNORMAL LOW (ref >59)
GFR calc non Af Amer: 46 mL/min/{1.73_m2} — ABNORMAL LOW (ref >59)
Globulin, Total: 2.4 g/dL (ref 1.5–4.5)
Glucose: 76 mg/dL (ref 65–99)
Potassium: 4 mmol/L (ref 3.5–5.2)
Sodium: 142 mmol/L (ref 134–144)
Total Protein: 6.3 g/dL (ref 6.0–8.5)

## 2016-09-14 LAB — PROTIME-INR
INR: 1.4 — ABNORMAL HIGH (ref 0.8–1.2)
Prothrombin Time: 14.6 s — ABNORMAL HIGH (ref 9.1–12.0)

## 2016-09-14 LAB — MAGNESIUM: Magnesium: 1.4 mg/dL — ABNORMAL LOW (ref 1.6–2.3)

## 2016-09-14 LAB — TSH: TSH: 7.1 u[IU]/mL — ABNORMAL HIGH (ref 0.450–4.500)

## 2016-09-17 ENCOUNTER — Telehealth: Payer: Self-pay | Admitting: *Deleted

## 2016-09-17 DIAGNOSIS — I480 Paroxysmal atrial fibrillation: Secondary | ICD-10-CM

## 2016-09-17 DIAGNOSIS — I25810 Atherosclerosis of coronary artery bypass graft(s) without angina pectoris: Secondary | ICD-10-CM

## 2016-09-17 DIAGNOSIS — E032 Hypothyroidism due to medicaments and other exogenous substances: Secondary | ICD-10-CM

## 2016-09-17 MED ORDER — LEVOTHYROXINE SODIUM 125 MCG PO TABS: 125.0000 ug | ORAL_TABLET | Freq: Every day | ORAL | 0 refills | Status: DC

## 2016-09-17 NOTE — Telephone Encounter (Signed)
otes Recorded by Lars Masson, MD on 09/16/2016 at 4:39 PM EST The patient has elevated TSH, we will increase Synthroid from 100-125 g. She has elevated creatinine from 1.1-1.29, I would advise her to hold Lasix for now. She also has low magnesium I would advise to use magnesium oxide 400 mg by mouth daily for next weeks.  I will repeat BMP and TSH in 2 months.   09/17/16--Pt notified, verbalized understanding, repeat BMET/TSH scheduled for March 9,2018.

## 2016-09-17 NOTE — Addendum Note (Signed)
Addended by: Jacqlyn Krauss on: 09/17/2016 10:49 AM   Modules accepted: Orders

## 2016-09-20 ENCOUNTER — Ambulatory Visit (HOSPITAL_COMMUNITY)
Admission: RE | Admit: 2016-09-20 | Discharge: 2016-09-20 | Disposition: A | Discharge status: Home / Self Care | Payer: Medicare Other | Source: Ambulatory Visit | Attending: Cardiology | Admitting: Cardiology

## 2016-09-20 ENCOUNTER — Ambulatory Visit (HOSPITAL_COMMUNITY): Payer: Medicare Other | Admitting: Anesthesiology

## 2016-09-20 ENCOUNTER — Encounter (HOSPITAL_COMMUNITY): Payer: Self-pay

## 2016-09-20 ENCOUNTER — Encounter (HOSPITAL_COMMUNITY)
Admission: RE | Disposition: A | Discharge status: Home / Self Care | Payer: Self-pay | Source: Ambulatory Visit | Attending: Cardiology

## 2016-09-20 DIAGNOSIS — I429 Cardiomyopathy, unspecified: Secondary | ICD-10-CM | POA: Diagnosis not present

## 2016-09-20 DIAGNOSIS — Z7982 Long term (current) use of aspirin: Secondary | ICD-10-CM | POA: Insufficient documentation

## 2016-09-20 DIAGNOSIS — I110 Hypertensive heart disease with heart failure: Secondary | ICD-10-CM | POA: Diagnosis not present

## 2016-09-20 DIAGNOSIS — K219 Gastro-esophageal reflux disease without esophagitis: Secondary | ICD-10-CM | POA: Diagnosis not present

## 2016-09-20 DIAGNOSIS — Z7902 Long term (current) use of antithrombotics/antiplatelets: Secondary | ICD-10-CM | POA: Diagnosis not present

## 2016-09-20 DIAGNOSIS — I509 Heart failure, unspecified: Secondary | ICD-10-CM | POA: Diagnosis not present

## 2016-09-20 DIAGNOSIS — E876 Hypokalemia: Secondary | ICD-10-CM | POA: Insufficient documentation

## 2016-09-20 DIAGNOSIS — I5042 Chronic combined systolic (congestive) and diastolic (congestive) heart failure: Secondary | ICD-10-CM | POA: Insufficient documentation

## 2016-09-20 DIAGNOSIS — I25810 Atherosclerosis of coronary artery bypass graft(s) without angina pectoris: Secondary | ICD-10-CM | POA: Diagnosis not present

## 2016-09-20 DIAGNOSIS — E785 Hyperlipidemia, unspecified: Secondary | ICD-10-CM | POA: Diagnosis not present

## 2016-09-20 DIAGNOSIS — Z951 Presence of aortocoronary bypass graft: Secondary | ICD-10-CM | POA: Diagnosis not present

## 2016-09-20 DIAGNOSIS — Z8614 Personal history of Methicillin resistant Staphylococcus aureus infection: Secondary | ICD-10-CM | POA: Diagnosis not present

## 2016-09-20 DIAGNOSIS — I951 Orthostatic hypotension: Secondary | ICD-10-CM | POA: Diagnosis not present

## 2016-09-20 DIAGNOSIS — Z880 Allergy status to penicillin: Secondary | ICD-10-CM | POA: Insufficient documentation

## 2016-09-20 DIAGNOSIS — Z8673 Personal history of transient ischemic attack (TIA), and cerebral infarction without residual deficits: Secondary | ICD-10-CM | POA: Diagnosis not present

## 2016-09-20 DIAGNOSIS — I480 Paroxysmal atrial fibrillation: Secondary | ICD-10-CM | POA: Diagnosis not present

## 2016-09-20 DIAGNOSIS — J449 Chronic obstructive pulmonary disease, unspecified: Secondary | ICD-10-CM | POA: Diagnosis not present

## 2016-09-20 DIAGNOSIS — I2510 Atherosclerotic heart disease of native coronary artery without angina pectoris: Secondary | ICD-10-CM | POA: Diagnosis not present

## 2016-09-20 DIAGNOSIS — Z7901 Long term (current) use of anticoagulants: Secondary | ICD-10-CM | POA: Diagnosis not present

## 2016-09-20 DIAGNOSIS — E039 Hypothyroidism, unspecified: Secondary | ICD-10-CM | POA: Diagnosis not present

## 2016-09-20 DIAGNOSIS — I4891 Unspecified atrial fibrillation: Secondary | ICD-10-CM | POA: Diagnosis not present

## 2016-09-20 DIAGNOSIS — Z79899 Other long term (current) drug therapy: Secondary | ICD-10-CM | POA: Diagnosis not present

## 2016-09-20 DIAGNOSIS — F17210 Nicotine dependence, cigarettes, uncomplicated: Secondary | ICD-10-CM | POA: Insufficient documentation

## 2016-09-20 HISTORY — PX: CARDIOVERSION: SHX1299

## 2016-09-20 SURGERY — CARDIOVERSION
Anesthesia: General

## 2016-09-20 MED ORDER — LIDOCAINE HCL (CARDIAC) 20 MG/ML IV SOLN
INTRAVENOUS | Status: DC | PRN
  Administered 2016-09-20: 50 mg via INTRAVENOUS

## 2016-09-20 MED ORDER — PROPOFOL 10 MG/ML IV BOLUS
INTRAVENOUS | Status: DC | PRN
  Administered 2016-09-20: 30 mg via INTRAVENOUS
  Administered 2016-09-20: 60 mg via INTRAVENOUS

## 2016-09-20 NOTE — Anesthesia Preprocedure Evaluation (Addendum)
Anesthesia Evaluation  Patient identified by MRN, date of birth, ID band Patient awake    Airway Mallampati: II  TM Distance: >3 FB Neck ROM: Full    Dental  (+) Teeth Intact   Pulmonary COPD, Current Smoker,    breath sounds clear to auscultation       Cardiovascular hypertension, + CAD, + Cardiac Stents, + CABG and +CHF  + dysrhythmias Atrial Fibrillation  Rhythm:Irregular Rate:Tachycardia     Neuro/Psych CVA    GI/Hepatic   Endo/Other  Hypothyroidism   Renal/GU      Musculoskeletal   Abdominal   Peds  Hematology   Anesthesia Other Findings   Reproductive/Obstetrics                            Anesthesia Physical Anesthesia Plan  ASA: III  Anesthesia Plan: General   Post-op Pain Management:    Induction: Intravenous  Airway Management Planned: Mask  Additional Equipment:   Intra-op Plan:   Post-operative Plan:   Informed Consent: I have reviewed the patients History and Physical, chart, labs and discussed the procedure including the risks, benefits and alternatives for the proposed anesthesia with the patient or authorized representative who has indicated his/her understanding and acceptance.   Dental advisory given  Plan Discussed with: CRNA  Anesthesia Plan Comments:         Anesthesia Quick Evaluation

## 2016-09-20 NOTE — Interval H&P Note (Signed)
History and Physical Interval Note:  09/20/2016 1:04 PM  [name]  has presented today for surgery, with the diagnosis of AFIB  The various methods of treatment have been discussed with the patient and family. After consideration of risks, benefits and other options for treatment, the patient has consented to  Procedure(s): CARDIOVERSION (N/A) as a surgical intervention .  The patient's history has been reviewed, patient examined, no change in status, stable for surgery.  I have reviewed the patient's chart and labs.  Questions were answered to the patient's satisfaction.     Tobias Alexander

## 2016-09-20 NOTE — Anesthesia Postprocedure Evaluation (Addendum)
Anesthesia Post Note  Patient: [name]  Procedure(s) Performed: Procedure(s) (LRB): CARDIOVERSION (N/A)  Patient location during evaluation: PACU Anesthesia Type: General Level of consciousness: awake and alert Pain management: pain level controlled Vital Signs Assessment: post-procedure vital signs reviewed and stable Respiratory status: spontaneous breathing, nonlabored ventilation, respiratory function stable and patient connected to nasal cannula oxygen Cardiovascular status: blood pressure returned to baseline and stable Postop Assessment: no signs of nausea or vomiting Anesthetic complications: no       Last Vitals:  Vitals:   09/20/16 1405 09/20/16 1415  BP: (!) 155/96 (!) 168/95  Pulse: (!) 54 (!) 52  Resp: (!) 21 20  Temp:      Last Pain:  Vitals:   09/20/16 1353  TempSrc: Oral                 [name],[name]

## 2016-09-20 NOTE — H&P (View-Only) (Signed)
Patient ID: [name], female   DOB: 1956-06-15, 61 y.o.   MRN: [id number]      Cardiology Office Note   Date:  09/13/2016   ID:  [name], DOB Aug 12, 1956, MRN [id number]  PCP:  Tina Harp, MD  Cardiologist:  Dr. Tobias Alexander    Chief complain: Fatigue   History of Present Illness: [name] is a 61 y.o. female with a hx of CAD s/p CABG and subsequent stenting of the native LAD (LIMA-LAD found to be atretic), PAF (on amiodarone and Xarelto), chronic combined systolic and diastolic HF (EF 78% in 2015 >> improved to 65-70% in 3/16), HTN, HL, CKD, OSA, CVA in 2012, COPD, bradycardia.    Evaluated by Dr. Tobias Alexander 11/2014 for progressively worsening DOE.  R/L heart cath was arranged.  She was admitted 3/3-3/4. Cardiac catheterization demonstrated patent LAD stent, SVG-RI/OM patent, SVG-PDA occluded (new finding), severe distal PDA disease treated with balloon angioplasty only (This was suboptimal as balloon was not fully expanded).  Because she is on chronic xarelto therapy, she was discharged on aspirin, Plavix, and xarelto. She will need to discontinue aspirin after one month and continue Plavix and xarelto after that time.  Readmitted 4/2-4/6 with atrial fibrillation with RVR. She had run out of amiodarone 4 days prior to admission. She was hypokalemic with a potassium less than 3. She was placed on IV amiodarone. Potassium was replaced. Patient returned to NSR and she was transitioned back to oral amiodarone. She was placed on spironolactone to help with achieving normal potassium levels. Hospitalization was complicated by hypotension as well as worsening renal insufficiency felt to be related to hypoperfusion from AF with RVR.  Troponins were elevated. As the patient had incomplete dilation of the PDA during previous angioplasty, this was not felt to be a surprise in the setting of atrial fibrillation with RVR. This was felt to be demand ischemia. Medical therapy was  continued. Aspirin was stopped and she was >30 days post PCI.    05/31/15 - 5 months follow up, she feels significantly better, she has no CP and denies SOB, also her dizziness and orthostatic hypotension has improved. She is able to walk with a cane. She was admitted in July with hypothyroidism, hypokalemia and hypomagnesemia. Synthroid was increased then. On 05/16/2015 TSH was 0.5, but she was off synthroid for a while.  Recently she was not able to use her kitchen and developed acute renal insufficiency. Now eating/drinking well. Denies orthopnea, PND. No bleeding. No muscle pain.  09/13/2016 - the patient is coming after 15 months, she has been compliant with her medicines and denies any side effects. She denies any bleeding and has been compliant with Xarelto. She denies any chest pain or dyspnea on exertion as well as lower extremity edema however has noticed worsening fatigue. She denies any palpitations or syncope.  Studies/Reports Reviewed Today:  LHC/PCI 11/10/14 LM: ok LAD: stent in the proximal segment that is roughly 20% in-stent restenosis. RI: 70% proximal disease.  LCx:  80-90% stenosis in the mid AV groove segment RCA: prox 40%, mid 40-50%, RPDA ostial 95% then 95-99% then 60% L-LAD:  Atretic (old) S-RI/OM:  Patent  S-RPDA:  prox 100% PCI:  Suboptimal PTCA only of the ostial RPDA and mid RPDA leading to the anastomosis site. Unable to fully expand the balloon.  Echo 11/08/14 - Mild focal basal hypertrophy of the septum. LVF vigorous. EF 65% to 70%.  HK basalinferior myocardium. Grade 1 diastolic dysfunction. -  Aortic valve: There was trivial regurgitation. - Left atrium: The atrium was mildly dilated. Impressions: Compared to the prior study, there has been no significant interval change.  Myoview 02/19/14 IMPRESSION: No evidence for reversibility or myocardial ischemia.  Calculated ejection fraction is 55%.  Carotid US 5/15 No sig ICA stenosis   Past Medical History:    Diagnosis Date  . Acute right MCA stroke (HCC) 11/07/10  . Atrial fibrillation (HCC)    a. s/p TEE-DCCV 02/2104; b. Xarelto started  . CAD (coronary artery disease)    a.  cath 09/2010: LAD stent patent, S-Int/dCFX ok, S-PDA ok, L-LAD atretic;  b. Lexiscan Myoview (02/2014):  no ischemia, EF 55%; c. 11/2014 Cath/PCI: LM nl, LAD 20pISR, LCX 80-81m, OM1 nl, RI 70p, RCA 40-55m, RPDA 95ost/95-61m (PTCA only w/ reduction to 50p/73m), 60d, L->LAD atretic, VG->RI->OM nl, VG->PDA 100p.  . Cardiomyopathy with EF 40% at TEE 02/17/14 (likely tachycardia mediated - Myoview 02/19/14 neg for ischemia with normal EF) 02/18/2014  . COPD (chronic obstructive pulmonary disease) (HCC)   . Depression   . Dysrhythmia 10/2014   atrial fib with rvr  . Eczema   . GERD (gastroesophageal reflux disease)   . Headache   . HLD (hyperlipidemia)   . Homelessness 11/12/2011  . 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 Hx of CABG   . Hx of transesophageal echocardiography (TEE) for monitoring 11/2010   TEE 11/2010: EF 60-65%, BAE, trivial atrial septal shunt;  right heart cath in 10/2010 with elevated R and L heart pressures and diuretic started  . Hypertension   . Hypothyroidism   . Pulmonary nodules    repeat CT due in 11/2011  . Sleep apnea    recent sleep study  in 04/2014 per chart review  shows no significant OSA    Past Surgical History:  Procedure Laterality Date  . bilateral knee surgery    . BLADDER SURGERY    . CARDIOVERSION N/A 02/17/2014   Procedure: CARDIOVERSION;  Surgeon: Pricilla Riffle, MD;  Location: [facility] ENDOSCOPY;  Service: Cardiovascular;  Laterality: N/A;  . cath 2012    . CHEST WALL RECONSTRUCTION    . CORONARY ARTERY BYPASS GRAFT    . debriment for infection in chest    . HERNIA REPAIR    . LEFT AND RIGHT HEART CATHETERIZATION WITH CORONARY ANGIOGRAM N/A 11/10/2014   Procedure: LEFT AND RIGHT HEART CATHETERIZATION WITH  CORONARY ANGIOGRAM;  Surgeon: Marykay Lex, MD;  Location: [facility] CATH LAB;  Service: Cardiovascular;  Laterality: N/A;  . Left mastoidectomy    . TEE WITHOUT CARDIOVERSION N/A 02/17/2014   Procedure: TRANSESOPHAGEAL ECHOCARDIOGRAM (TEE);  Surgeon: Pricilla Riffle, MD;  Location: [facility] ENDOSCOPY;  Service: Cardiovascular;  Laterality: N/A;     Current Outpatient Prescriptions  Medication Sig Dispense Refill  . albuterol (PROVENTIL HFA;VENTOLIN HFA) 108 (90 BASE) MCG/ACT inhaler Inhale 1 puff into the lungs every 6 (six) hours as needed for wheezing or shortness of breath.    [name] amiodarone (PACERONE) 200 MG tablet Take 1 tablet (200 mg total) by mouth daily. 90 tablet 0  . atorvastatin (LIPITOR) 20 MG tablet Take 1 tablet (20 mg total) by mouth daily. 90 tablet 0  . buPROPion (WELLBUTRIN XL) 300 MG 24 hr tablet Take 1 tablet (300 mg total) by mouth every morning. 30 tablet 3  . clopidogrel (PLAVIX) 75 MG  tablet TAKE ONE TABLET BY MOUTH ONCE DAILY IN THE MORNING 90 tablet 0  . divalproex (DEPAKOTE ER) 500 MG 24 hr tablet Take 2 tablets (1,000 mg total) by mouth daily. (Patient taking differently: Take 500 mg by mouth daily. ) 180 tablet 1  . escitalopram (LEXAPRO) 10 MG tablet Take 1 tablet (10 mg total) by mouth daily. 30 tablet 2  . fluticasone (FLONASE) 50 MCG/ACT nasal spray Place 2 sprays into both nostrils daily. 48 g 1  . levothyroxine (SYNTHROID, LEVOTHROID) 100 MCG tablet TAKE ONE TABLET BY MOUTH ONCE DAILY BEFORE BREAKFAST 90 tablet 1  . magnesium oxide (MAG-OX) 400 MG tablet Take 1 tablet (400 mg total) by mouth 2 (two) times daily. 180 tablet 1  . nitroGLYCERIN (NITROSTAT) 0.4 MG SL tablet Place 1 tablet (0.4 mg total) under the tongue every 5 (five) minutes as needed for chest pain. 90 tablet 3  . ondansetron (ZOFRAN-ODT) 4 MG disintegrating tablet DISSOLVE ONE TABLET IN MOUTH EVERY 8 HOURS AS NEEDED FOR NAUSEA 10 tablet 0  . potassium chloride SA (KLOR-CON M20) 20 MEQ tablet Take 1 tablet (20  mEq total) by mouth 3 (three) times daily. 90 tablet 0  . rivaroxaban (XARELTO) 20 MG TABS tablet Take 1 tablet (20 mg total) by mouth daily with supper. 90 tablet 0  . tamsulosin (FLOMAX) 0.4 MG CAPS capsule Take 1 capsule (0.4 mg total) by mouth daily. 30 capsule 0  . topiramate (TOPAMAX) 50 MG tablet Take 1 tablet (50 mg total) by mouth 2 (two) times daily. 180 tablet 5  . traMADol (ULTRAM) 50 MG tablet TAKE TWO TABLETS BY MOUTH EVERY 6 HOURS AS NEEDED FOR PAIN 30 tablet 0  . vitamin B-12 (CYANOCOBALAMIN) 1000 MCG tablet Take 1,000 mcg by mouth daily.     No current facility-administered medications for this visit.     Allergies:   Avelox [moxifloxacin hcl in nacl]; Pamelor [nortriptyline hcl]; Amoxicillin; Hydrocodone; Oxycodone; Penicillins; and Sulfa antibiotics    Social History:  The patient  reports that she has been smoking Cigarettes.  She has a 7.50 pack-year smoking history. She has never used smokeless tobacco. She reports that she does not drink alcohol or use drugs.   Family History:  The patient's family history includes Alcohol abuse in her father; Anxiety disorder in her sister; Arthritis in her mother; COPD in her mother; Depression in her father, mother, and sister; Diabetes type II in her mother; Drug abuse in her sister; Heart attack in her father; Heart disease in her mother; Hypertension in her father; Osteoporosis in her mother.    ROS:   Please see the history of present illness.   Review of Systems  Hematologic/Lymphatic: Bruises/bleeds easily.  Gastrointestinal: Positive for constipation.  Genitourinary: Positive for urgency.  Psychiatric/Behavioral: Positive for depression.  All other systems reviewed and are negative.    PHYSICAL EXAM: VS:  BP 126/76   Pulse 92   Ht 5\' 4"  (1.626 m)   Wt 162 lb (73.5 kg)   BMI 27.81 kg/m     No data found.    Wt Readings from Last 3 Encounters:  09/13/16 162 lb (73.5 kg)  07/04/16 164 lb 3.2 oz (74.5 kg)    02/20/16 149 lb 1.6 oz (67.6 kg)     GEN: Well nourished, well developed, in no acute distress  HEENT: normal  Neck: no JVD,  no masses Cardiac:  Normal S1/S2, RRR; no murmur ,  no rubs or gallops, no edema  R groin  without hematoma or bruit   Respiratory:  clear to auscultation bilaterally, no wheezing, rhonchi or rales. GI: soft, nontender, nondistended, + BS MS: no deformity or atrophy  Skin: warm and dry  Neuro:  CNs II-XII intact, Strength and sensation are intact Psych: Normal affect   EKG:  EKG is ordered today.  It demonstrates:   NSR, HR 78, normal axis, inferolateral T-wave inversions, no significant change compared to prior tracing QTc 421   Recent Labs: 02/28/2016: BUN 22; Creatinine, Ser 1.06; Magnesium 1.1; Potassium 4.1; Sodium 144    Lipid Panel    Component Value Date/Time   CHOL 210 (H) 03/14/2015 1159   TRIG 82.0 03/14/2015 1159   HDL 51.00 03/14/2015 1159   CHOLHDL 4 03/14/2015 1159   VLDL 16.4 03/14/2015 1159   LDLCALC 143 (H) 03/14/2015 1159   LDLDIRECT 153.0 01/15/2010 1501   TTE 11/2014 Left ventricle: The cavity size was normal. There was mild focal basal hypertrophy of the septum. Systolic function was vigorous. The estimated ejection fraction was in the range of 65% to 70%. There is hypokinesis of the basalinferior myocardium. Doppler parameters are consistent with abnormal left ventricular relaxation (grade 1 diastolic dysfunction). - Aortic valve: There was trivial regurgitation. - Left atrium: The atrium was mildly dilated.  Impressions:  - Compared to the prior study, there has been no significant interval change.    ASSESSMENT AND PLAN:  1. PAF - The patient is back in atrial fibrillation despite being on amiodarone, she feels tired, will schedule outpatient cardioversion for September 20, 2016 with me. She has been compliant with Xarelto and has no side effects. The patient appears short of breath at rest she states it  secondary to having stuffy nose. She has been on amiodarone for 2 years now we will check her PFTs. We will refer her to an AP clinic for consideration of atrial fibrillation ablation.  2. Coronary artery disease involving autologous vein coronary bypass graft without angina pectoris Improved symptoms. ECG shows unchanged negative T waves in the anterolateral and inferior leads.   -  Continue Plavix in addition to Xarelto, statin.  ASA was stopped 30 days post PCI.  - unable to add betablocker as she is bradycardic  - Ischemic workup at this time.  3. Orthostatic hypotension - resolved after holding diuretics  4. Essential hypertension Controlled.  5. Hyperlipidemia Continue atorvastatin 20 mg po daily. Check LFTs today.  6. Hypothyrodism - recheck TSH today.   Check CBC, CMP, TSH today schedule for cardioversion on 09/20/2016, follow up in 4 weeks.   Tobias Alexander, MD 09/13/2016

## 2016-09-20 NOTE — Discharge Instructions (Signed)
Electrical Cardioversion, Care After °This sheet gives you information about how to care for yourself after your procedure. Your health care provider may also give you more specific instructions. If you have problems or questions, contact your health care provider. °What can I expect after the procedure? °After the procedure, it is common to have: °· Some redness on the skin where the shocks were given. °Follow these instructions at home: °· Do not drive for 24 hours if you were given a medicine to help you relax (sedative). °· Take over-the-counter and prescription medicines only as told by your health care provider. °· Ask your health care provider how to check your pulse. Check it often. °· Rest for 48 hours after the procedure or as told by your health care provider. °· Avoid or limit your caffeine use as told by your health care provider. °Contact a health care provider if: °· You feel like your heart is beating too quickly or your pulse is not regular. °· You have a serious muscle cramp that does not go away. °Get help right away if: °· You have discomfort in your chest. °· You are dizzy or you feel faint. °· You have trouble breathing or you are short of breath. °· Your speech is slurred. °· You have trouble moving an arm or leg on one side of your body. °· Your fingers or toes turn cold or blue. °This information is not intended to replace advice given to you by your health care provider. Make sure you discuss any questions you have with your health care provider. °Document Released: 06/16/2013 Document Revised: 03/29/2016 Document Reviewed: 03/01/2016 °Elsevier Interactive Patient Education © 2017 [employer] ° °

## 2016-09-20 NOTE — CV Procedure (Signed)
    Cardioversion Note  [name] [id number] 1956-04-18  Procedure: DC Cardioversion Indications: atrial fibrillation with RVR  Procedure Details Consent: Obtained Time Out: Verified patient identification, verified procedure, site/side was marked, verified correct patient position, special equipment/implants available, Radiology Safety Procedures followed,  medications/allergies/relevent history reviewed, required imaging and test results available.  Performed  The patient has been on adequate anticoagulation.  The patient received IV propofol and lidacaine administered by anesthesia MD for sedation.  Synchronous cardioversion was performed at 120 joules.  The cardioversion was successful.   Complications: No apparent complications Patient did tolerate procedure well.   Tobias Alexander, MD, [facility] 09/20/2016, 1:50 PM

## 2016-09-20 NOTE — Transfer of Care (Signed)
Immediate Anesthesia Transfer of Care Note  Patient: [name]  Procedure(s) Performed: Procedure(s): CARDIOVERSION (N/A)  Patient Location: Endoscopy Unit  Anesthesia Type:General  Level of Consciousness: sedated and responds to stimulation  Airway & Oxygen Therapy: Patient Spontanous Breathing and Patient connected to nasal cannula oxygen  Post-op Assessment: Report given to RN and Post -op Vital signs reviewed and stable  Post vital signs: Reviewed and stable  Last Vitals:  Vitals:   09/20/16 1203 09/20/16 1344  BP: (!) 188/118 119/85  Pulse: 92 (!) 50  Resp: 20 20    Last Pain:  Vitals:   09/20/16 1203  TempSrc: Oral         Complications: No apparent anesthesia complications

## 2016-09-23 ENCOUNTER — Encounter (HOSPITAL_COMMUNITY): Payer: Self-pay | Admitting: Cardiology

## 2016-09-27 ENCOUNTER — Ambulatory Visit (INDEPENDENT_AMBULATORY_CARE_PROVIDER_SITE_OTHER): Payer: Medicare Other | Admitting: Internal Medicine

## 2016-09-27 DIAGNOSIS — I480 Paroxysmal atrial fibrillation: Secondary | ICD-10-CM | POA: Diagnosis not present

## 2016-09-27 DIAGNOSIS — I25810 Atherosclerosis of coronary artery bypass graft(s) without angina pectoris: Secondary | ICD-10-CM

## 2016-09-27 LAB — PULMONARY FUNCTION TEST
DL/VA % pred: 94 %
DL/VA: 4.54 ml/min/mmHg/L
DLCO cor % pred: 59 %
DLCO cor: 14.42 ml/min/mmHg
DLCO unc % pred: 59 %
DLCO unc: 14.38 ml/min/mmHg
FEF 25-75 Post: 1.43 L/sec
FEF 25-75 Pre: 0.98 L/sec
FEF2575-%Change-Post: 46 %
FEF2575-%Pred-Post: 59 %
FEF2575-%Pred-Pre: 40 %
FEV1-%Change-Post: 8 %
FEV1-%Pred-Post: 61 %
FEV1-%Pred-Pre: 56 %
FEV1-Post: 1.59 L
FEV1-Pre: 1.46 L
FEV1FVC-%Change-Post: 8 %
FEV1FVC-%Pred-Pre: 93 %
FEV6-%Change-Post: 0 %
FEV6-%Pred-Post: 62 %
FEV6-%Pred-Pre: 62 %
FEV6-Post: 2.01 L
FEV6-Pre: 2.01 L
FEV6FVC-%Pred-Post: 103 %
FEV6FVC-%Pred-Pre: 103 %
FVC-%Change-Post: 0 %
FVC-%Pred-Post: 60 %
FVC-%Pred-Pre: 60 %
FVC-Post: 2.01 L
FVC-Pre: 2.01 L
Post FEV1/FVC ratio: 79 %
Post FEV6/FVC ratio: 100 %
Pre FEV1/FVC ratio: 73 %
Pre FEV6/FVC Ratio: 100 %
RV % pred: 93 %
RV: 1.84 L
TLC % pred: 76 %
TLC: 3.89 L

## 2016-09-30 ENCOUNTER — Encounter: Payer: Self-pay | Admitting: Cardiology

## 2016-09-30 DIAGNOSIS — J449 Chronic obstructive pulmonary disease, unspecified: Secondary | ICD-10-CM

## 2016-10-01 ENCOUNTER — Other Ambulatory Visit: Payer: Self-pay | Admitting: Cardiology

## 2016-10-02 ENCOUNTER — Telehealth: Payer: Self-pay

## 2016-10-02 MED ORDER — TIOTROPIUM BROMIDE MONOHYDRATE 2.5 MCG/ACT IN AERS: 1.0000 | INHALATION_SPRAY | Freq: Every day | RESPIRATORY_TRACT | 1 refills | Status: DC

## 2016-10-02 NOTE — Telephone Encounter (Signed)
Notes Recorded by Lars Masson, MD on 09/30/2016 at 11:06 AM EST Pulmonary function test showed Moderately severe Obstructive Airways Disease, Moderate Diffusion Defect  I would start Spiriva 2.5 mcg daily and referral to pulmonary.    Sent prescription in for patient- patient requested through MyChart to have medications sent to [facility].

## 2016-10-03 ENCOUNTER — Encounter (HOSPITAL_COMMUNITY): Payer: Self-pay | Admitting: Psychiatry

## 2016-10-03 ENCOUNTER — Ambulatory Visit (INDEPENDENT_AMBULATORY_CARE_PROVIDER_SITE_OTHER): Payer: 59 | Admitting: Psychiatry

## 2016-10-03 ENCOUNTER — Other Ambulatory Visit: Payer: Self-pay | Admitting: Cardiology

## 2016-10-03 ENCOUNTER — Encounter: Payer: Self-pay | Admitting: Cardiology

## 2016-10-03 VITALS — BP 122/76 | HR 60 | Ht 64.0 in | Wt 160.4 lb

## 2016-10-03 DIAGNOSIS — Z8262 Family history of osteoporosis: Secondary | ICD-10-CM

## 2016-10-03 DIAGNOSIS — Z8249 Family history of ischemic heart disease and other diseases of the circulatory system: Secondary | ICD-10-CM

## 2016-10-03 DIAGNOSIS — F331 Major depressive disorder, recurrent, moderate: Secondary | ICD-10-CM | POA: Diagnosis not present

## 2016-10-03 DIAGNOSIS — Z811 Family history of alcohol abuse and dependence: Secondary | ICD-10-CM

## 2016-10-03 DIAGNOSIS — F3342 Major depressive disorder, recurrent, in full remission: Secondary | ICD-10-CM

## 2016-10-03 DIAGNOSIS — Z813 Family history of other psychoactive substance abuse and dependence: Secondary | ICD-10-CM

## 2016-10-03 DIAGNOSIS — Z79899 Other long term (current) drug therapy: Secondary | ICD-10-CM

## 2016-10-03 DIAGNOSIS — F17210 Nicotine dependence, cigarettes, uncomplicated: Secondary | ICD-10-CM

## 2016-10-03 DIAGNOSIS — Z8261 Family history of arthritis: Secondary | ICD-10-CM

## 2016-10-03 DIAGNOSIS — Z9889 Other specified postprocedural states: Secondary | ICD-10-CM

## 2016-10-03 DIAGNOSIS — Z818 Family history of other mental and behavioral disorders: Secondary | ICD-10-CM

## 2016-10-03 MED ORDER — ESCITALOPRAM OXALATE 10 MG PO TABS: 10.0000 mg | ORAL_TABLET | Freq: Every day | ORAL | 4 refills | Status: DC

## 2016-10-03 MED ORDER — BUPROPION HCL ER (XL) 300 MG PO TB24: 150.0000 mg | ORAL_TABLET | Freq: Every morning | ORAL | 5 refills | Status: DC

## 2016-10-03 NOTE — Progress Notes (Signed)
[facility] MD Progress Note  10/03/2016 3:05 PM [name]  MRN:  [id number] Subjective:  Fairly good Principal Problem: Major depression, recurrent mild Diagnosis:  Major depression, mild Today the patient is seen ONE TIME. The patient starts off by saying that she is better. Since the addition of 10 mg of Lexapro to her 300 mg well she feels better. Her mood is up. She sleeps and eats well. Her energy level is improved. She denies worthlessness. The patient used to smoke cigarettes many years ago but no longer unfortunately she still has COPD. The patient has significant medical problems. She's had coronary bypass surgery. Just last month she cardioversion for atrial fibrillation. Patient says she has chronic pain in her chest after her surgery many years ago because she lacks sister. The patient denies the use of any alcohol or drugs. She continues little lifestyle where she rents a room in every day she goes to her son's house she walks his dog and eat some food bypass. She has an arrangement with her son. She is a car that she's making payments on he drives to work he works at night makes enough money that she and he can knee. She didn't pay for the car he would have a job able to be homeless. The patient has no evidence of psychosis. She denies being suicidal. She's otherwise TV and reading. The patient does that she should be in therapy. She is financially devastated. She has a relatively poor social support system. Patient Active Problem List   Diagnosis Date Noted  . Pre-procedure lab exam [Z01.812] 09/13/2016  . Generalized headaches [R51] 07/04/2016  . Protein-calorie malnutrition, severe (HCC) [E43] 04/03/2015  . Chronic diastolic CHF (congestive heart failure), NYHA class 2 (HCC) [I50.32] 01/24/2015  . Demand ischemia secondary to AF with RVR [I24.8] 12/13/2014  . CKD (chronic kidney disease), stage III [N18.3] 11/11/2014  . Hypokalemia [E87.6] 05/22/2014  . Mood disorder (HCC) [F39]  03/20/2014  . HTN (hypertension) [I10] 03/20/2014  . Anemia, unspecified [D64.9] 03/20/2014  . Tobacco use disorder [F17.200] 03/20/2014  . Atrial fibrillation with RVR (HCC) [I48.91] 03/03/2014  . Hypotension [I95.9] 03/03/2014  . Pulmonary hypertension [I27.20] 03/03/2014  . COPD (chronic obstructive pulmonary disease) (HCC) [J44.9] 03/03/2014  . History of right MCA stroke [Z86.73]   . Long-term (current) use of anticoagulants [Z79.01]   . 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 Hyperlipidemia [E78.5] 02/18/2014  . CAD '07, LAD PCI 2012, SVG-PDA PTCA 11/10/14 [I25.10] 02/16/2014  . Chronic diastolic heart failure (HCC) [I50.32]   . Pulmonary nodules [R91.8]   . Hypothyroidism [E03.9]   . OSA- C-pap intol [G47.33]    Total Time spent with patient: 30 minutes  Past Psychiatric History:   Past Medical History:  Past Medical History:  Diagnosis Date  . Acute right MCA stroke (HCC) 11/07/10  . Atrial fibrillation (HCC)    a. s/p TEE-DCCV 02/2104; b. Xarelto started  . CAD (coronary artery disease)    a.  cath 09/2010: LAD stent patent, S-Int/dCFX ok, S-PDA ok, L-LAD atretic;  b. Lexiscan Myoview (02/2014):  no ischemia, EF 55%; c. 11/2014 Cath/PCI: LM nl, LAD 20pISR, LCX 80-81m, OM1 nl, RI 70p, RCA 40-66m, RPDA 95ost/95-72m (PTCA only w/ reduction to 50p/81m), 60d, L->LAD atretic, VG->RI->OM nl, VG->PDA 100p.  . Cardiomyopathy with EF 40% at TEE 02/17/14 (likely tachycardia mediated - Myoview 02/19/14 neg for ischemia with normal EF) 02/18/2014  . COPD (chronic obstructive pulmonary disease) (HCC)   .  Depression   . Dysrhythmia 10/2014   atrial fib with rvr  . Eczema   . GERD (gastroesophageal reflux disease)   . Headache   . HLD (hyperlipidemia)   . Homelessness 11/12/2011  . 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 Hx of CABG   . Hx of transesophageal  echocardiography (TEE) for monitoring 11/2010   TEE 11/2010: EF 60-65%, BAE, trivial atrial septal shunt;  right heart cath in 10/2010 with elevated R and L heart pressures and diuretic started  . Hypertension   . Hypothyroidism   . Pulmonary nodules    repeat CT due in 11/2011  . Sleep apnea    recent sleep study  in 04/2014 per chart review  shows no significant OSA    Past Surgical History:  Procedure Laterality Date  . bilateral knee surgery    . BLADDER SURGERY    . CARDIOVERSION N/A 02/17/2014   Procedure: CARDIOVERSION;  Surgeon: Pricilla Riffle, MD;  Location: [facility] ENDOSCOPY;  Service: Cardiovascular;  Laterality: N/A;  . CARDIOVERSION N/A 09/20/2016   Procedure: CARDIOVERSION;  Surgeon: Lars Masson, MD;  Location: [facility] ENDOSCOPY;  Service: Cardiovascular;  Laterality: N/A;  . cath 2012    . CHEST WALL RECONSTRUCTION    . CORONARY ARTERY BYPASS GRAFT    . debriment for infection in chest    . HERNIA REPAIR    . LEFT AND RIGHT HEART CATHETERIZATION WITH CORONARY ANGIOGRAM N/A 11/10/2014   Procedure: LEFT AND RIGHT HEART CATHETERIZATION WITH CORONARY ANGIOGRAM;  Surgeon: Marykay Lex, MD;  Location: [facility] CATH LAB;  Service: Cardiovascular;  Laterality: N/A;  . Left mastoidectomy    . TEE WITHOUT CARDIOVERSION N/A 02/17/2014   Procedure: TRANSESOPHAGEAL ECHOCARDIOGRAM (TEE);  Surgeon: Pricilla Riffle, MD;  Location: [facility] ENDOSCOPY;  Service: Cardiovascular;  Laterality: N/A;   Family History:  Family History  Problem Relation Age of Onset  . COPD Mother   . Heart disease Mother   . Arthritis Mother     Rheumatoid and PMR  . Osteoporosis Mother     Mom fractured hip  . Diabetes type II Mother   . Depression Mother   . Heart attack Father   . Depression Father   . Hypertension Father   . Alcohol abuse Father   . Depression Sister   . Anxiety disorder Sister   . Drug abuse Sister   . Stroke Neg Hx    Family Psychiatric  History:  Social History:  History  Alcohol Use No      History  Drug Use No    Social History   Social History  . Marital status: Divorced    Spouse name: N/A  . Number of children: 2  . Years of education: N/A   Occupational History  . DISABLE     Social History Main Topics  . Smoking status: Current Every Day Smoker    Packs/day: 0.25    Years: 30.00    Types: Cigarettes    Last attempt to quit: 09/10/2014  . Smokeless tobacco: Never Used  . Alcohol use No  . Drug use: No  . Sexual activity: Not Currently   Other Topics Concern  . None   Social History Narrative   Divorced   After stroke was living with sister and mother after sister asked her to leave. Mom has since deceased  Difficulties over control.    Then moved back in for about 6 weeks.   . tranportaion difficult son  helping   Ex-smoker   Was living at friends  Sister and her had argument and she was told to leave .    She was living in a homeless shelter for the last 3 weeks [facility]    son had help with transportation and medication       Work status regular  before got sick on short-term disability now lost t insurance after the stroke and couldn't work was  denied ssi    2 times.  She is not eligible for Medicaid because she doesn't have dependent children.         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   Lives in  rented house 2 house mates males keep tp self has her own b room  Doing well    Additional Social History:                         Sleep: Good  Appetite:  Good  Current Medications: Current Outpatient Prescriptions  Medication Sig Dispense Refill  . atorvastatin (LIPITOR) 20 MG tablet TAKE 1 TABLET BY MOUTH  DAILY 90 tablet 3  . buPROPion (WELLBUTRIN XL) 300 MG 24 hr tablet Take 1 tablet (300 mg total) by  mouth every morning. 30 tablet 5  . clopidogrel (PLAVIX) 75 MG tablet TAKE ONE TABLET BY MOUTH ONCE DAILY IN THE MORNING 90 tablet 0  . divalproex (DEPAKOTE ER) 500 MG 24 hr tablet Take 2 tablets (1,000 mg total) by mouth daily. (Patient taking differently: Take 500 mg by mouth daily. ) 180 tablet 1  . escitalopram (LEXAPRO) 10 MG tablet Take 1 tablet (10 mg total) by mouth daily. 30 tablet 4  . fluticasone (FLONASE) 50 MCG/ACT nasal spray Place 2 sprays into both nostrils daily. 48 g 1  . levothyroxine (SYNTHROID) 125 MCG tablet Take 1 tablet (125 mcg total) by mouth daily before breakfast. 90 tablet 0  . magnesium oxide (MAG-OX) 400 MG tablet Take 1 tablet (400 mg total) by mouth daily. 180 tablet 1  . nitroGLYCERIN (NITROSTAT) 0.4 MG SL tablet Place 1 tablet (0.4 mg total) under the tongue every 5 (five) minutes as needed for chest pain. 90 tablet 3  . ondansetron (ZOFRAN-ODT) 4 MG disintegrating tablet DISSOLVE ONE TABLET IN MOUTH EVERY 8 HOURS AS NEEDED FOR NAUSEA 10 tablet 0  . potassium chloride SA (KLOR-CON M20) 20 MEQ tablet Take 1 tablet (20 mEq total) by mouth 3 (three) times daily. 90 tablet 0  . rivaroxaban (XARELTO) 20 MG TABS tablet Take 1 tablet (20 mg total) by mouth daily with supper. 90 tablet 0  . tamsulosin (FLOMAX) 0.4 MG CAPS capsule Take 1 capsule (0.4 mg total) by mouth daily. 30 capsule 0  . Tiotropium Bromide Monohydrate (SPIRIVA RESPIMAT) 2.5 MCG/ACT AERS Inhale 1 puff into the lungs daily. 1 Inhaler 1  . topiramate (TOPAMAX) 50 MG tablet Take 1 tablet (50 mg total) by mouth 2 (two) times daily. 180 tablet 5  . traMADol (ULTRAM) 50 MG tablet TAKE TWO TABLETS BY MOUTH EVERY 6 HOURS AS NEEDED FOR PAIN 30 tablet 0  .  vitamin B-12 (CYANOCOBALAMIN) 1000 MCG tablet Take 1,000 mcg by mouth daily.    [name] albuterol (PROVENTIL HFA;VENTOLIN HFA) 108 (90 BASE) MCG/ACT inhaler Inhale 1 puff into the lungs every 6 (six) hours as needed for wheezing or shortness of breath.    [name] amiodarone  (PACERONE) 200 MG tablet Take 1 tablet (200 mg total) by mouth daily. 90 tablet 0   No current facility-administered medications for this visit.     Lab Results: No results found for this or any previous visit (from the past 48 hour(s)).  Physical Findings: AIMS:  , ,  ,  ,    CIWA:    COWS:     Musculoskeletal: Strength & Muscle Tone: within normal limits Gait & Station: normal Patient leans: Right  Psychiatric Specialty Exam: ROS  Blood pressure 122/76, pulse 60, height 5\' 4"  (1.626 m), weight 160 lb 6.4 oz (72.8 kg).Body mass index is 27.53 kg/m.  General Appearance: NA  Eye Contact::  Good  Speech:  Clear and Coherent  Volume:  Normal  Mood:  NA  Affect:  Appropriate  Thought Process:  Coherent  Orientation:  NA  Thought Content:  WDL  Suicidal Thoughts:  No  Homicidal Thoughts:  No  Memory:  NA  Judgement:  Good  Insight:  Good  Psychomotor Activity:  Normal  Concentration:  Good  Recall:  Good  Fund of Knowledge:Good  Language: Good  Akathisia:  No  Handed:  Right  AIMS (if indicated):     Assets:  Desire for Improvement  ADL's:  Intact  Cognition: WNL  Sleep:      Treatment Plan Summary  At this time the patient will continue taking 10 mg of Lexapro 300 mg and Wellbutrin. It should be reinstated when she took too much Wellbutrin she claims she had visual hallucinations. At this time she clearly seems to have a response to her antidepressants. She still very therapy today she'll make arrangements see a therapist. Overall the patient's depression symptoms are mainly resolved. She'll return to see me in 4 months. Joannie Springs [facility] 10/03/2016, 3:05 PM

## 2016-10-04 ENCOUNTER — Encounter: Payer: Self-pay | Admitting: Internal Medicine

## 2016-10-04 ENCOUNTER — Encounter: Payer: Self-pay | Admitting: Cardiology

## 2016-10-04 ENCOUNTER — Telehealth: Payer: Self-pay | Admitting: *Deleted

## 2016-10-04 NOTE — Telephone Encounter (Signed)
PTS PHONE DOES NOT RECIEVE CALLS, WILL EMAIL HER ABOUT SAMPLES AT FRONT DESK.

## 2016-10-08 ENCOUNTER — Other Ambulatory Visit (INDEPENDENT_AMBULATORY_CARE_PROVIDER_SITE_OTHER): Payer: Medicare Other

## 2016-10-08 DIAGNOSIS — E7849 Other hyperlipidemia: Secondary | ICD-10-CM

## 2016-10-08 DIAGNOSIS — Z7901 Long term (current) use of anticoagulants: Secondary | ICD-10-CM

## 2016-10-08 DIAGNOSIS — D649 Anemia, unspecified: Secondary | ICD-10-CM | POA: Diagnosis not present

## 2016-10-08 DIAGNOSIS — E538 Deficiency of other specified B group vitamins: Secondary | ICD-10-CM

## 2016-10-08 DIAGNOSIS — E039 Hypothyroidism, unspecified: Secondary | ICD-10-CM | POA: Diagnosis not present

## 2016-10-08 DIAGNOSIS — E784 Other hyperlipidemia: Secondary | ICD-10-CM | POA: Diagnosis not present

## 2016-10-08 LAB — CBC WITH DIFFERENTIAL/PLATELET
Basophils Absolute: 0 10*3/uL (ref 0.0–0.1)
Basophils Relative: 0.6 % (ref 0.0–3.0)
EOS PCT: 2.7 % (ref 0.0–5.0)
Eosinophils Absolute: 0.1 10*3/uL (ref 0.0–0.7)
HEMATOCRIT: 40.2 % (ref 36.0–46.0)
HEMOGLOBIN: 13.6 g/dL (ref 12.0–15.0)
LYMPHS PCT: 27.6 % (ref 12.0–46.0)
Lymphs Abs: 1.3 10*3/uL (ref 0.7–4.0)
MCHC: 33.8 g/dL (ref 30.0–36.0)
MCV: 95.7 fl (ref 78.0–100.0)
Monocytes Absolute: 0.5 10*3/uL (ref 0.1–1.0)
Monocytes Relative: 9.8 % (ref 3.0–12.0)
Neutro Abs: 2.8 10*3/uL (ref 1.4–7.7)
Neutrophils Relative %: 59.3 % (ref 43.0–77.0)
Platelets: 144 10*3/uL — ABNORMAL LOW (ref 150.0–400.0)
RBC: 4.19 Mil/uL (ref 3.87–5.11)
RDW: 14.1 % (ref 11.5–15.5)
WBC: 4.7 10*3/uL (ref 4.0–10.5)

## 2016-10-08 LAB — LIPID PANEL
CHOL/HDL RATIO: 3
Cholesterol: 188 mg/dL (ref 0–200)
HDL: 65.6 mg/dL (ref >39.00)
LDL CALC: 106 mg/dL — AB (ref 0–99)
NonHDL: 122.86
TRIGLYCERIDES: 83 mg/dL (ref 0.0–149.0)
VLDL: 16.6 mg/dL (ref 0.0–40.0)

## 2016-10-08 LAB — TSH: TSH: 97.02 u[IU]/mL — ABNORMAL HIGH (ref 0.35–4.50)

## 2016-10-08 LAB — BASIC METABOLIC PANEL
BUN: 16 mg/dL (ref 6–23)
CHLORIDE: 109 meq/L (ref 96–112)
CO2: 26 mEq/L (ref 19–32)
CREATININE: 1.66 mg/dL — AB (ref 0.40–1.20)
Calcium: 8.7 mg/dL (ref 8.4–10.5)
GFR: 33.61 mL/min — ABNORMAL LOW (ref >60.00)
Glucose, Bld: 80 mg/dL (ref 70–99)
POTASSIUM: 3.6 meq/L (ref 3.5–5.1)
SODIUM: 141 meq/L (ref 135–145)

## 2016-10-08 LAB — VITAMIN B12: VITAMIN B 12: 329 pg/mL (ref 211–911)

## 2016-10-09 ENCOUNTER — Encounter: Payer: Self-pay | Admitting: Internal Medicine

## 2016-10-09 ENCOUNTER — Ambulatory Visit (INDEPENDENT_AMBULATORY_CARE_PROVIDER_SITE_OTHER): Payer: Medicare Other | Admitting: Internal Medicine

## 2016-10-09 VITALS — BP 120/76 | HR 53 | Ht 64.0 in | Wt 160.0 lb

## 2016-10-09 DIAGNOSIS — I4819 Other persistent atrial fibrillation: Secondary | ICD-10-CM

## 2016-10-09 DIAGNOSIS — I480 Paroxysmal atrial fibrillation: Secondary | ICD-10-CM | POA: Diagnosis not present

## 2016-10-09 DIAGNOSIS — I481 Persistent atrial fibrillation: Secondary | ICD-10-CM | POA: Diagnosis not present

## 2016-10-09 DIAGNOSIS — R001 Bradycardia, unspecified: Secondary | ICD-10-CM | POA: Diagnosis not present

## 2016-10-09 DIAGNOSIS — I10 Essential (primary) hypertension: Secondary | ICD-10-CM

## 2016-10-09 NOTE — Progress Notes (Signed)
Electrophysiology Office Note   Date:  10/09/2016   ID:  [name], DOB 03-14-1956, MRN [id number]  PCP:  [name], MD  Cardiologist:  Dr Delton See Primary Electrophysiologist: Hillis Range, MD    Chief Complaint  Patient presents with  . Atrial Fibrillation     History of Present Illness: [name] is a 61 y.o. female who presents today for electrophysiology evaluation.   The patient has had atrial fibrillation for about 3 years.  She has been treated with amiodarone.  She seems to be doing reasonably well.  She has trouble today articulating symptoms with her afib.  She was cardioverted previously. She has had a stroke in 2012.  She is not appropriately anticoagulated with xarelto.  She has fatigue and decreased exercise tolerance chronically.  She also has chronic bradycardia.  Today, she denies symptoms of palpitations, chest pain, shortness of breath, orthopnea, PND, lower extremity edema, claudication, dizziness, presyncope, syncope, bleeding, or neurologic sequela. The patient is tolerating medications without difficulties and is otherwise without complaint today.    Past Medical History:  Diagnosis Date  . Acute right MCA stroke (HCC) 11/07/10  . Atrial fibrillation (HCC)    a. s/p TEE-DCCV 02/2104; b. Xarelto started  . CAD (coronary artery disease)    a.  cath 09/2010: LAD stent patent, S-Int/dCFX ok, S-PDA ok, L-LAD atretic;  b. Lexiscan Myoview (02/2014):  no ischemia, EF 55%; c. 11/2014 Cath/PCI: LM nl, LAD 20pISR, LCX 80-17m, OM1 nl, RI 70p, RCA 40-1m, RPDA 95ost/95-63m (PTCA only w/ reduction to 50p/56m), 60d, L->LAD atretic, VG->RI->OM nl, VG->PDA 100p.  . Cardiomyopathy with EF 40% at TEE 02/17/14 (likely tachycardia mediated - Myoview 02/19/14 neg for ischemia with normal EF) 02/18/2014  . COPD (chronic obstructive pulmonary disease) (HCC)   . Depression   . Eczema   . GERD (gastroesophageal reflux disease)   . Headache   . HLD (hyperlipidemia)   .  Homelessness 11/12/2011  . 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 Hx of CABG   . Hx of transesophageal echocardiography (TEE) for monitoring 11/2010   TEE 11/2010: EF 60-65%, BAE, trivial atrial septal shunt;  right heart cath in 10/2010 with elevated R and L heart pressures and diuretic started  . Hypertension   . Hypothyroidism   . Persistent atrial fibrillation (HCC) 10/2014  . Pulmonary nodules    repeat CT due in 11/2011  . Sleep apnea    recent sleep study  in 04/2014 per chart review  shows no significant OSA   Past Surgical History:  Procedure Laterality Date  . bilateral knee surgery    . BLADDER SURGERY    . CARDIOVERSION N/A 02/17/2014   Procedure: CARDIOVERSION;  Surgeon: Pricilla Riffle, MD;  Location: [facility] ENDOSCOPY;  Service: Cardiovascular;  Laterality: N/A;  . CARDIOVERSION N/A 09/20/2016   Procedure: CARDIOVERSION;  Surgeon: Lars Masson, MD;  Location: [facility] ENDOSCOPY;  Service: Cardiovascular;  Laterality: N/A;  . cath 2012    . CHEST WALL RECONSTRUCTION    . CORONARY ARTERY BYPASS GRAFT    . debriment for infection in chest    . HERNIA REPAIR    . LEFT AND RIGHT HEART CATHETERIZATION WITH CORONARY ANGIOGRAM N/A 11/10/2014   Procedure: LEFT AND RIGHT HEART CATHETERIZATION WITH CORONARY ANGIOGRAM;  Surgeon: Marykay Lex, MD;  Location[facility] CATH LAB;  Service:  Cardiovascular;  Laterality: N/A;  . Left mastoidectomy    . TEE WITHOUT CARDIOVERSION N/A 02/17/2014   Procedure: TRANSESOPHAGEAL ECHOCARDIOGRAM (TEE);  Surgeon: Pricilla Riffle, MD;  Location: [facility] ENDOSCOPY;  Service: Cardiovascular;  Laterality: N/A;     Current Outpatient Prescriptions  Medication Sig Dispense Refill  . albuterol (PROVENTIL HFA;VENTOLIN HFA) 108 (90 BASE) MCG/ACT inhaler Inhale 1 puff into the lungs every 6 (six) hours as needed for wheezing or shortness of breath.    [name] amiodarone (PACERONE) 200 MG tablet TAKE 1  TABLET BY MOUTH  DAILY 90 tablet 3  . atorvastatin (LIPITOR) 20 MG tablet TAKE 1 TABLET BY MOUTH  DAILY 90 tablet 3  . buPROPion (WELLBUTRIN XL) 300 MG 24 hr tablet Take 1 tablet (300 mg total) by mouth every morning. 30 tablet 5  . clopidogrel (PLAVIX) 75 MG tablet TAKE 1 TABLET BY MOUTH  EVERY MORNING 90 tablet 3  . divalproex (DEPAKOTE) 500 MG DR tablet Take 500 mg by mouth daily.    [name] escitalopram (LEXAPRO) 10 MG tablet Take 1 tablet (10 mg total) by mouth daily. 30 tablet 4  . fluticasone (FLONASE) 50 MCG/ACT nasal spray Place 2 sprays into both nostrils daily. 48 g 1  . levothyroxine (SYNTHROID) 125 MCG tablet Take 1 tablet (125 mcg total) by mouth daily before breakfast. 90 tablet 0  . magnesium oxide (MAG-OX) 400 MG tablet Take 1 tablet (400 mg total) by mouth daily. 180 tablet 1  . nitroGLYCERIN (NITROSTAT) 0.4 MG SL tablet Place 1 tablet (0.4 mg total) under the tongue every 5 (five) minutes as needed for chest pain. 90 tablet 3  . ondansetron (ZOFRAN-ODT) 4 MG disintegrating tablet DISSOLVE ONE TABLET IN MOUTH EVERY 8 HOURS AS NEEDED FOR NAUSEA 10 tablet 0  . potassium chloride SA (K-DUR,KLOR-CON) 20 MEQ tablet TAKE 1 TABLET BY MOUTH 3  TIMES DAILY 270 tablet 3  . Tiotropium Bromide Monohydrate (SPIRIVA RESPIMAT) 2.5 MCG/ACT AERS Inhale 1 puff into the lungs daily. 1 Inhaler 1  . topiramate (TOPAMAX) 50 MG tablet Take 1 tablet (50 mg total) by mouth 2 (two) times daily. 180 tablet 5  . XARELTO 20 MG TABS tablet TAKE 1 TABLET BY MOUTH  EVERY DAY WITH SUPPER 90 tablet 3   No current facility-administered medications for this visit.     Allergies:   Avelox [moxifloxacin hcl in nacl]; Pamelor [nortriptyline hcl]; Amoxicillin; Hydrocodone; Oxycodone; Penicillins; and Sulfa antibiotics   Social History:  The patient  reports that she has been smoking Cigarettes.  She has a 7.50 pack-year smoking history. She has never used smokeless tobacco. She reports that she does not drink alcohol or  use drugs.   Family History:  The patient's  family history includes Alcohol abuse in her father; Anxiety disorder in her sister; Arthritis in her mother; COPD in her mother; Depression in her father, mother, and sister; Diabetes type II in her mother; Drug abuse in her sister; Heart attack in her father; Heart disease in her mother; Hypertension in her father; Osteoporosis in her mother.    ROS:  Please see the history of present illness.   All other systems are reviewed and negative.    PHYSICAL EXAM: VS:  BP 120/76   Pulse (!) 53   Ht 5\' 4"  (1.626 m)   Wt 160 lb (72.6 kg)   SpO2 98%   BMI 27.46 kg/m  , BMI Body mass index is 27.46 kg/m. GEN: Well nourished, well developed, in no acute distress  HEENT: normal  Neck: no JVD, carotid bruits, or masses Cardiac: RRR; no murmurs, rubs, or gallops,no edema  Respiratory:  clear to auscultation bilaterally, normal work of breathing GI: soft, nontender, nondistended, + BS MS: no deformity or atrophy  Skin: warm and dry  Neuro:  Strength and sensation are intact Psych: flat affect  EKG:  EKG is ordered today. The ekg ordered today shows sinus bradycardia 44 bpm, nonspecific St/T changes, Qtc 410 msec   Recent Labs: 09/13/2016: ALT 7; Magnesium 1.4 10/08/2016: BUN 16; Creatinine, Ser 1.66; Hemoglobin 13.6; Platelets 144.0; Potassium 3.6; Sodium 141; TSH 97.02    Lipid Panel     Component Value Date/Time   CHOL 188 10/08/2016 0803   TRIG 83.0 10/08/2016 0803   HDL 65.60 10/08/2016 0803   CHOLHDL 3 10/08/2016 0803   VLDL 16.6 10/08/2016 0803   LDLCALC 106 (H) 10/08/2016 0803   LDLDIRECT 153.0 01/15/2010 1501     Wt Readings from Last 3 Encounters:  10/09/16 160 lb (72.6 kg)  09/20/16 162 lb (73.5 kg)  09/13/16 162 lb (73.5 kg)      Other studies Reviewed: Additional studies/ records that were reviewed today include: Prior echo, Dr Joanie Coddington notes  Review of the above records today demonstrates: as above   ASSESSMENT AND  PLAN:  1.  Persistent atrial fibrillation The patient has atrial fibrillation with breakthrough episodes on amiodarone.  She has difficulty articulating the symptoms of her afib.  Therapeutic strategies for afib including medicine and ablation were discussed in detail with the patient today. Risk, benefits, and alternatives to EP study and radiofrequency ablation for afib were also discussed in detail today.  She is clear that she is not interested in ablation at this time.  She could consider tikosyn however this may be cost prohibitive.  We discussed rate control as a good alternative strategy.  I think that if she has additional afib with minimal symptoms, we should stop amiodarone at that time and try rate control strategy.  chads2vasc score is at least 5.  Continue life long anticoagulation.  2. Sinus bradycardia She has fatigue and reduced heart rates.  I did offer pacemaker today for chronotropic incompetence.  Risks of the procedure were discussed with the patient.  She is clear that she is not interested in pacing at this time.  3. htn Stable No change required today  Follow-up with Dr Delton See as scheduled Can follow-up in AF clinic when needed  I will see as needed going forward.  Current medicines are reviewed at length with the patient today.   The patient does not have concerns regarding her medicines.  The following changes were made today:  none  Labs/ tests ordered today include:  Orders Placed This Encounter  Procedures  . EKG 12-Lead     Signed, Hillis Range, MD  10/09/2016 12:06 PM     [facility] [address] [place] [address] [phone] (office) [phone] (fax)

## 2016-10-09 NOTE — Patient Instructions (Signed)
Medication Instructions:  Your physician recommends that you continue on your current medications as directed. Please refer to the Current Medication list given to you today.  Labwork: None ordered.  Testing/Procedures: None ordered.  Follow-Up: Your physician recommends that you schedule a follow-up appointment as needed.   Any Other Special Instructions Will Be Listed Below (If Applicable).     If you need a refill on your cardiac medications before your next appointment, please call your pharmacy.   

## 2016-10-14 NOTE — Progress Notes (Signed)
Pre visit review using our clinic review tool, if applicable. No additional management support is needed unless otherwise documented below in the visit note.  Chief Complaint  Patient presents with  . Follow-up    HPI: [name] 61 y.o. a lady with  Multiple medical conditions  Cad, deprssion hypothyroid  AF  S/p cva  hld CM  andhx of malnutrion and homelses ness with picky eating and come in for Chronic disease management  She states she is improved. Follow-up of lab monitoring.   Seen dr Donell Beers for depression felt to mbe in remission and to fu 4 mos    alos dr Johney Frame for PAF  Noted to hjhave pules 50s  And sinus   She was out of thyroid medicine difficulty getting the mail away for about a week or so her TSH was up and Dr. Delton See increased her dose and refilled her medicine. She's been back on her thyroid medicine for about a week.  She takes her Synthroid at the same time she takes all her morning medicines because it easier.  No significant edema or falling has not had a B12 injection in a while. She shifts her timed about 50-50 with her food shopping with her son.  She is eating much better which has some negative consequences because of weight gain. But is happier that she can do this.  No recent falling or bleeding she is now on anticoagulation.  She states her breathing is good at baseline.  Being arrangements plan ordered has supra planes refrigerator and now she is eating better.   Past Medical History:  Diagnosis Date  . Acute right MCA stroke (HCC) 11/07/10  . Atrial fibrillation (HCC)    a. s/p TEE-DCCV 02/2104; b. Xarelto started  . CAD (coronary artery disease)    a.  cath 09/2010: LAD stent patent, S-Int/dCFX ok, S-PDA ok, L-LAD atretic;  b. Lexiscan Myoview (02/2014):  no ischemia, EF 55%; c. 11/2014 Cath/PCI: LM nl, LAD 20pISR, LCX 80-98m, OM1 nl, RI 70p, RCA 40-103m, RPDA 95ost/95-71m (PTCA only w/ reduction to 50p/10m), 60d, L->LAD atretic, VG->RI->OM nl,  VG->PDA 100p.  . Cardiomyopathy with EF 40% at TEE 02/17/14 (likely tachycardia mediated - Myoview 02/19/14 neg for ischemia with normal EF) 02/18/2014  . COPD (chronic obstructive pulmonary disease) (HCC)   . Depression   . Eczema   . GERD (gastroesophageal reflux disease)   . Headache   . HLD (hyperlipidemia)   . Homelessness 11/12/2011  . 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 Hx of CABG   . Hx of transesophageal echocardiography (TEE) for monitoring 11/2010   TEE 11/2010: EF 60-65%, BAE, trivial atrial septal shunt;  right heart cath in 10/2010 with elevated R and L heart pressures and diuretic started  . Hypertension   . Hypothyroidism   . Persistent atrial fibrillation (HCC) 10/2014  . Pulmonary nodules    repeat CT due in 11/2011  . Sleep apnea    recent sleep study  in 04/2014 per chart review  shows no significant OSA    Family History  Problem Relation Age of Onset  . COPD Mother   . Heart disease Mother   . Arthritis Mother     Rheumatoid and PMR  . Osteoporosis Mother     Mom fractured hip  . Diabetes type II Mother   . Depression Mother   .  Heart attack Father   . Depression Father   . Hypertension Father   . Alcohol abuse Father   . Depression Sister   . Anxiety disorder Sister   . Drug abuse Sister   . Stroke Neg Hx     Social History   Social History  . Marital status: Divorced    Spouse name: N/A  . Number of children: 2  . Years of education: N/A   Occupational History  . DISABLE     Social History Main Topics  . Smoking status: Current Every Day Smoker    Packs/day: 0.25    Years: 30.00    Types: Cigarettes    Last attempt to quit: 09/10/2014  . Smokeless tobacco: Never Used  . Alcohol use No  . Drug use: No  . Sexual activity: Not Currently   Other Topics Concern  . None   Social History Narrative   Divorced   After stroke was living with sister and  mother after sister asked her to leave. Mom has since deceased    Difficulties over control.    Then moved back in for about 6 weeks.   . tranportaion difficult son  helping   Ex-smoker   Was living at friends  Sister and her had argument and she was told to leave .    She was living in a homeless shelter for the last 3 weeks [facility]    son had help with transportation and medication       Work status regular  before got sick on short-term disability now lost t insurance after the stroke and couldn't work was  denied ssi    2 times.  She is not eligible for Medicaid because she doesn't have dependent children.         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   Lives in  rented house 2 house mates males keep tp self has her own b room  Doing well     Outpatient Medications Prior to Visit  Medication Sig Dispense Refill  . albuterol (PROVENTIL HFA;VENTOLIN HFA) 108 (90 BASE) MCG/ACT inhaler Inhale 1 puff into the lungs every 6 (six) hours as needed for wheezing or shortness of breath.    [name] amiodarone (PACERONE) 200 MG tablet TAKE 1 TABLET BY MOUTH  DAILY 90 tablet 3  . atorvastatin (LIPITOR) 20 MG tablet TAKE 1 TABLET BY MOUTH  DAILY 90 tablet 3  . buPROPion (WELLBUTRIN XL) 300 MG 24 hr tablet Take 1 tablet (300 mg total) by mouth every morning. 30 tablet 5  . clopidogrel (PLAVIX) 75 MG tablet TAKE 1 TABLET BY MOUTH  EVERY MORNING 90 tablet 3  . divalproex (DEPAKOTE) 500 MG DR tablet Take 250 mg by mouth daily.     [name] escitalopram (LEXAPRO) 10 MG tablet Take 1 tablet (10 mg total) by mouth daily. 30 tablet 4  . fluticasone (FLONASE) 50 MCG/ACT nasal spray Place 2 sprays into both nostrils daily. 48 g 1  . levothyroxine (SYNTHROID) 125 MCG tablet Take 1 tablet (125 mcg total)  by mouth daily before breakfast. 90 tablet 0  . magnesium oxide (MAG-OX) 400 MG tablet Take 1 tablet (400 mg total) by mouth daily. 180 tablet 1  . nitroGLYCERIN (NITROSTAT)  0.4 MG SL tablet Place 1 tablet (0.4 mg total) under the tongue every 5 (five) minutes as needed for chest pain. 90 tablet 3  . ondansetron (ZOFRAN-ODT) 4 MG disintegrating tablet DISSOLVE ONE TABLET IN MOUTH EVERY 8 HOURS AS NEEDED FOR NAUSEA 10 tablet 0  . potassium chloride SA (K-DUR,KLOR-CON) 20 MEQ tablet TAKE 1 TABLET BY MOUTH 3  TIMES DAILY 270 tablet 3  . topiramate (TOPAMAX) 50 MG tablet Take 1 tablet (50 mg total) by mouth 2 (two) times daily. 180 tablet 5  . XARELTO 20 MG TABS tablet TAKE 1 TABLET BY MOUTH  EVERY DAY WITH SUPPER 90 tablet 3  . Tiotropium Bromide Monohydrate (SPIRIVA RESPIMAT) 2.5 MCG/ACT AERS Inhale 1 puff into the lungs daily. (Patient not taking: Reported on 10/15/2016) 1 Inhaler 1   No facility-administered medications prior to visit.      EXAM:  BP (!) 156/100 (BP Location: Right Arm, Patient Position: Sitting, Cuff Size: Normal)   Temp 98.3 F (36.8 C) (Oral)   Ht 5\' 4"  (1.626 m)   Wt 164 lb (74.4 kg)   BMI 28.15 kg/m   Body mass index is 28.15 kg/m.  GENERAL: vitals reviewed and listed above, alert, oriented, appears well hydrated and in no acute distress HEENT: atraumatic, conjunctiva  clear, no obvious abnormalities on inspection of external nose and earsNECK: no obvious masses on inspection palpation  LUNGS: clear to auscultation bilaterally, no wheezes, rales or rhonchi, good air movement CV: HRRR, no clubbing cyanosis or  Slight  peripheral edema nl cap refill  MS: moves all extremities without noticeable focal  abnormality gait is steady year can get up from sitting without assistance and turn no foot drag. No fasciculations EOMs are intact. PSYCH: pleasant and cooperative, no obvious depression or anxiety mood looks more improved. No obvious bruising or bleeding. Lab  Results  Component Value Date   WBC 4.7 10/08/2016   HGB 13.6 10/08/2016   HCT 40.2 10/08/2016   PLT 144.0 (L) 10/08/2016   GLUCOSE 80 10/08/2016   CHOL 188 10/08/2016   TRIG 83.0 10/08/2016   HDL 65.60 10/08/2016   LDLDIRECT 153.0 01/15/2010   LDLCALC 106 (H) 10/08/2016   ALT 7 09/13/2016   AST 10 09/13/2016   NA 141 10/08/2016   K 3.6 10/08/2016   CL 109 10/08/2016   CREATININE 1.66 (H) 10/08/2016   BUN 16 10/08/2016   CO2 26 10/08/2016   TSH 97.02 (H) 10/08/2016   INR 1.4 (H) 09/13/2016   HGBA1C  02/11/2011    5.4 (NOTE)                                                                       According to the ADA Clinical Practice Recommendations for 2011, when HbA1c is used as a screening test:   >=6.5%   Diagnostic of Diabetes Mellitus           (if abnormal result  is confirmed)  5.7-6.4%   Increased risk of developing Diabetes Mellitus  References:Diagnosis and Classification of Diabetes Mellitus,Diabetes Care,2011,34(Suppl 1):S62-S69 and Standards of Medical Care in         Diabetes - 2011,Diabetes Care,2011,34  (Suppl 1):S11-S61.   BP Readings from Last 3 Encounters:  10/15/16 [name])  156/100  10/09/16 120/76  10/03/16 122/76   Wt Readings from Last 3 Encounters:  10/15/16 164 lb (74.4 kg)  10/09/16 160 lb (72.6 kg)  10/03/16 160 lb 6.4 oz (72.8 kg)   Reviewed lab work with patient.  ASSESSMENT AND PLAN:  Discussed the following assessment and plan:  Hypothyroidism, unspecified type  Essential hypertension  CKD (chronic kidney disease), stage III  Atrial fibrillation with RVR (HCC)  History of right MCA stroke  B12 deficiency - Plan: cyanocobalamin ((VITAMIN B-12)) injection 1,000 mcg  Medically complex patient  Medication management  Anticoagulant long-term use Thyroid is way off,  ran out of medicine also may be taking with the interfering medicine and is on amiodarone. Informed her they share her fatigue should significantly improved once the thyroid  replacement is appropriate. Suggest TSH and BMP in about 6-8 weeks. She can call for these lab appointment that she has to leave because her ride  is here. Prescription given for Tdap  To  Check with pharmacy  To be given if covered .  B12 injection today. Potassium level now seems to be  More  stable I suspect part of the difficulties were medicine replenishment and refeeding malnourishment issues that have since resolved.  -Patient advised to return or notify health care team  if  new concerns arise.  Patient Instructions  Glad you are doing better. Expect her energy to improve once her thyroid is in the right range. Please take her thyroid medicine and our away from the other medicines on an empty stomach except for water. This will help with absorption and help even adjustment of your thyroid medicine. B12 injection today. Would advise repeat chemistry panel at her next blood draw. Advise TSH and BMP in 6-8 weeks. If this is not done by cardiology we can do it. Medicare does not routinely pay for tetanus booster unless there is an injury however they may pay for it to get it at a pharmacy depending on your plan. We'll write a prescription and you can take it to a pharmacy to see if they will give you the injection. At this time however I would stay away from crowded areas because we're in the middle of the flu epidemic.  ROV in 4-6 months   Labs uin 6-8 weeks     Neta Mends. Panosh M.D.

## 2016-10-15 ENCOUNTER — Encounter: Payer: Self-pay | Admitting: Internal Medicine

## 2016-10-15 ENCOUNTER — Ambulatory Visit (INDEPENDENT_AMBULATORY_CARE_PROVIDER_SITE_OTHER): Payer: Medicare Other | Admitting: Internal Medicine

## 2016-10-15 VITALS — BP 156/100 | Temp 98.3°F | Ht 64.0 in | Wt 164.0 lb

## 2016-10-15 DIAGNOSIS — I4891 Unspecified atrial fibrillation: Secondary | ICD-10-CM

## 2016-10-15 DIAGNOSIS — Z7901 Long term (current) use of anticoagulants: Secondary | ICD-10-CM

## 2016-10-15 DIAGNOSIS — E538 Deficiency of other specified B group vitamins: Secondary | ICD-10-CM

## 2016-10-15 DIAGNOSIS — Z789 Other specified health status: Secondary | ICD-10-CM

## 2016-10-15 DIAGNOSIS — Z79899 Other long term (current) drug therapy: Secondary | ICD-10-CM

## 2016-10-15 DIAGNOSIS — E039 Hypothyroidism, unspecified: Secondary | ICD-10-CM

## 2016-10-15 DIAGNOSIS — N183 Chronic kidney disease, stage 3 unspecified: Secondary | ICD-10-CM

## 2016-10-15 DIAGNOSIS — Z8673 Personal history of transient ischemic attack (TIA), and cerebral infarction without residual deficits: Secondary | ICD-10-CM | POA: Diagnosis not present

## 2016-10-15 DIAGNOSIS — I10 Essential (primary) hypertension: Secondary | ICD-10-CM | POA: Diagnosis not present

## 2016-10-15 MED ORDER — CYANOCOBALAMIN 1000 MCG/ML IJ SOLN
1000.0000 ug | Freq: Once | INTRAMUSCULAR | Status: AC
  Administered 2016-10-15: 1000 ug via INTRAMUSCULAR

## 2016-10-15 NOTE — Patient Instructions (Signed)
Glad you are doing better. Expect her energy to improve once her thyroid is in the right range. Please take her thyroid medicine and our away from the other medicines on an empty stomach except for water. This will help with absorption and help even adjustment of your thyroid medicine. B12 injection today. Would advise repeat chemistry panel at her next blood draw. Advise TSH and BMP in 6-8 weeks. If this is not done by cardiology we can do it. Medicare does not routinely pay for tetanus booster unless there is an injury however they may pay for it to get it at a pharmacy depending on your plan. We'll write a prescription and you can take it to a pharmacy to see if they will give you the injection. At this time however I would stay away from crowded areas because we're in the middle of the flu epidemic.  ROV in 4-6 months   Labs uin 6-8 weeks

## 2016-10-29 ENCOUNTER — Institutional Professional Consult (permissible substitution): Payer: Self-pay | Admitting: Emergency Medicine

## 2016-11-01 ENCOUNTER — Ambulatory Visit: Payer: Self-pay | Admitting: Cardiology

## 2016-11-05 ENCOUNTER — Other Ambulatory Visit: Payer: Self-pay | Admitting: Cardiology

## 2016-11-05 DIAGNOSIS — I480 Paroxysmal atrial fibrillation: Secondary | ICD-10-CM

## 2016-11-05 DIAGNOSIS — I25810 Atherosclerosis of coronary artery bypass graft(s) without angina pectoris: Secondary | ICD-10-CM

## 2016-11-05 DIAGNOSIS — E032 Hypothyroidism due to medicaments and other exogenous substances: Secondary | ICD-10-CM

## 2016-11-06 NOTE — Telephone Encounter (Signed)
Pharmacy requesting a refill on mylan-levothyroxine. Please advise

## 2016-11-13 ENCOUNTER — Ambulatory Visit (INDEPENDENT_AMBULATORY_CARE_PROVIDER_SITE_OTHER): Payer: Medicare Other | Admitting: Emergency Medicine

## 2016-11-13 ENCOUNTER — Encounter: Payer: Self-pay | Admitting: Emergency Medicine

## 2016-11-13 DIAGNOSIS — J449 Chronic obstructive pulmonary disease, unspecified: Secondary | ICD-10-CM | POA: Diagnosis not present

## 2016-11-13 DIAGNOSIS — I4891 Unspecified atrial fibrillation: Secondary | ICD-10-CM | POA: Diagnosis not present

## 2016-11-13 MED ORDER — UMECLIDINIUM BROMIDE 62.5 MCG/INH IN AEPB: 1.0000 | INHALATION_SPRAY | Freq: Every day | RESPIRATORY_TRACT | 0 refills | Status: DC

## 2016-11-13 NOTE — Assessment & Plan Note (Signed)
On amiodarone. We will repeat her chest x-ray to compare with 2 yrs ago, eval for possible ILD changes.

## 2016-11-13 NOTE — Patient Instructions (Addendum)
We will try starting Incruse once a day.  We will perform a CXR at your next visit.  Obtain your Insurance formulary so we can determine the best inhaler regimen for you.  Flu shot and pneumonia shot up to date.  Follow with Dr Delton Coombes in 1 month or next available to review your status on the inhaled medication.

## 2016-11-13 NOTE — Assessment & Plan Note (Signed)
Confirmed on PFT 09/27/16. She was unable to start Spiriva due to cost. We will try starting her on Incruse once a day to see if she benefots. She will obtain her med formulary so we can review and pick best BD regimen going forward

## 2016-11-13 NOTE — Progress Notes (Signed)
Subjective:    Patient ID: [name], female    DOB: [birth year], 61 y.o.   MRN: [id number]  HPI  61 year old woman, former smoker (pack years) with a history of atrial fibrillation on amiodarone, coronary artery disease, groundglass pulmonary nodule in the left lower lobe on CT scan from 11/2010.  Referred for COPD. Ordered spiriva before but not currently due to cost.  PFT done 09/27/16 that I have reviewed >> severe obstruction without a BD response. CXR 12/10/14 >> CM without infiltrates. She feels that her breathing is OK. She does notice dyspnea with climbing stairs. No current wheeze. She has had AE before but last time was several yrs ago.   Review of Systems  Constitutional: Negative.  Negative for fever and unexpected weight change.  HENT: Positive for congestion, dental problem and sneezing. Negative for ear pain, nosebleeds, postnasal drip, rhinorrhea, sinus pressure, sore throat and trouble swallowing.   Eyes: Positive for itching. Negative for redness.  Respiratory: Positive for cough. Negative for chest tightness, shortness of breath and wheezing.   Cardiovascular: Positive for palpitations. Negative for leg swelling.  Gastrointestinal: Negative.  Negative for nausea and vomiting.  Endocrine: Negative.   Genitourinary: Negative.  Negative for dysuria.  Musculoskeletal: Positive for joint swelling.  Skin: Negative.  Negative for rash.  Allergic/Immunologic: Negative.  Negative for environmental allergies, food allergies and immunocompromised state.  Neurological: Positive for headaches.  Hematological: Bruises/bleeds easily.  Psychiatric/Behavioral: Negative.  Negative for dysphoric mood. The patient is not nervous/anxious.    Past Medical History:  Diagnosis Date  . Acute right MCA stroke (HCC) 11/07/10  . Atrial fibrillation (HCC)    a. s/p TEE-DCCV 02/2104; b. Xarelto started  . CAD (coronary artery disease)    a.  cath 09/2010: LAD stent patent, S-Int/dCFX ok, S-PDA ok,  L-LAD atretic;  b. Lexiscan Myoview (02/2014):  no ischemia, EF 55%; c. 11/2014 Cath/PCI: LM nl, LAD 20pISR, LCX 80-67m, OM1 nl, RI 70p, RCA 40-69m, RPDA 95ost/95-32m (PTCA only w/ reduction to 50p/70m), 60d, L->LAD atretic, VG->RI->OM nl, VG->PDA 100p.  . Cardiomyopathy with EF 40% at TEE 02/17/14 (likely tachycardia mediated - Myoview 02/19/14 neg for ischemia with normal EF) 02/18/2014  . COPD (chronic obstructive pulmonary disease) (HCC)   . Depression   . Eczema   . GERD (gastroesophageal reflux disease)   . Headache   . HLD (hyperlipidemia)   . Homelessness 11/12/2011  . 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 Hx of CABG   . Hx of transesophageal echocardiography (TEE) for monitoring 11/2010   TEE 11/2010: EF 60-65%, BAE, trivial atrial septal shunt;  right heart cath in 10/2010 with elevated R and L heart pressures and diuretic started  . Hypertension   . Hypothyroidism   . Persistent atrial fibrillation (HCC) 10/2014  . Pulmonary nodules    repeat CT due in 11/2011  . Sleep apnea    recent sleep study  in 04/2014 per chart review  shows no significant OSA     Family History  Problem Relation Age of Onset  . COPD Mother   . Heart disease Mother   . Arthritis Mother     Rheumatoid and PMR  . Osteoporosis Mother     Mom fractured hip  . Diabetes type II Mother   . Depression Mother   . Heart attack Father   . Depression  Father   . Hypertension Father   . Alcohol abuse Father   . Depression Sister   . Anxiety disorder Sister   . Drug abuse Sister   . Stroke Neg Hx      Social History   Social History  . Marital status: Divorced    Spouse name: N/A  . Number of children: 2  . Years of education: N/A   Occupational History  . DISABLE     Social History Main Topics  . Smoking status: Former Smoker    Packs/day: 0.25    Years: 30.00    Types: Cigarettes    Quit date: 09/10/2014  .  Smokeless tobacco: Never Used  . Alcohol use No  . Drug use: No  . Sexual activity: Not Currently   Other Topics Concern  . Not on file   Social History Narrative   Divorced   After stroke was living with sister and mother after sister asked her to leave. Mom has since deceased    Difficulties over control.    Then moved back in for about 6 weeks.   . tranportaion difficult son  helping   Ex-smoker   Was living at friends  Sister and her had argument and she was told to leave .    She was living in a homeless shelter for the last 3 weeks [facility]    son had help with transportation and medication       Work status regular  before got sick on short-term disability now lost t insurance after the stroke and couldn't work was  denied ssi    2 times.  She is not eligible for Medicaid because she doesn't have dependent children.         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   Lives in  rented house 2 house mates males keep tp self has her own b room  Doing well        Outpatient Medications Prior to Visit  Medication Sig Dispense Refill  . amiodarone (PACERONE) 200 MG tablet TAKE 1 TABLET BY MOUTH  DAILY 90 tablet 3  . atorvastatin (LIPITOR) 20 MG tablet TAKE 1 TABLET BY MOUTH  DAILY 90 tablet 3  . buPROPion (WELLBUTRIN XL) 300 MG 24 hr tablet Take 1 tablet (300 mg total) by mouth every morning. 30 tablet 5  . clopidogrel (PLAVIX) 75 MG tablet TAKE 1 TABLET BY MOUTH  EVERY MORNING 90 tablet 3  . divalproex (DEPAKOTE) 500 MG DR tablet Take 250 mg by mouth daily.     [name] escitalopram (LEXAPRO) 10 MG tablet Take 1 tablet (10 mg total) by mouth daily. 30 tablet 4  . fluticasone (FLONASE) 50 MCG/ACT nasal spray Place 2 sprays into both nostrils daily. 48 g 1  .  levothyroxine (SYNTHROID, LEVOTHROID) 125 MCG tablet TAKE 1 TABLET BY MOUTH  DAILY BEFORE BREAKFAST 90 tablet 4  . magnesium oxide (MAG-OX) 400 MG tablet Take 1 tablet (400 mg total) by mouth daily. 180 tablet 1  . nitroGLYCERIN (NITROSTAT) 0.4 MG SL tablet Place 1 tablet (0.4 mg total) under the tongue every 5 (five) minutes as needed for chest pain. 90 tablet 3  . ondansetron (ZOFRAN-ODT) 4 MG disintegrating tablet DISSOLVE ONE TABLET IN  MOUTH EVERY 8 HOURS AS NEEDED FOR NAUSEA 10 tablet 0  . potassium chloride SA (K-DUR,KLOR-CON) 20 MEQ tablet TAKE 1 TABLET BY MOUTH 3  TIMES DAILY 270 tablet 3  . topiramate (TOPAMAX) 50 MG tablet Take 1 tablet (50 mg total) by mouth 2 (two) times daily. 180 tablet 5  . XARELTO 20 MG TABS tablet TAKE 1 TABLET BY MOUTH  EVERY DAY WITH SUPPER 90 tablet 3  . albuterol (PROVENTIL HFA;VENTOLIN HFA) 108 (90 BASE) MCG/ACT inhaler Inhale 1 puff into the lungs every 6 (six) hours as needed for wheezing or shortness of breath.    . Tiotropium Bromide Monohydrate (SPIRIVA RESPIMAT) 2.5 MCG/ACT AERS Inhale 1 puff into the lungs daily. (Patient not taking: Reported on 10/15/2016) 1 Inhaler 1   No facility-administered medications prior to visit.         Objective:   Physical Exam Vitals:   11/13/16 1426  BP: 122/78  Pulse: (!) 50  SpO2: 98%  Weight: 165 lb 6.4 oz (75 kg)  Height: 5\' 4"  (1.626 m)   Gen: Pleasant, Overwt, in no distress, somewhat flataffect  ENT: No lesions,  mouth clear,  oropharynx clear, no postnasal drip  Neck: No JVD, no TMG, no carotid bruits  Lungs: No use of accessory muscles, distant, clear without rales or rhonchi  Cardiovascular: RRR, heart sounds normal, no murmur or gallops, trace peripheral edema  Musculoskeletal: No deformities, no cyanosis or clubbing  Neuro: alert, non focal  Skin: Warm, no lesions or rashes       Assessment & Plan:  Atrial fibrillation with RVR On amiodarone. We will repeat her chest x-ray to compare  with 2 yrs ago, eval for possible ILD changes.   COPD (chronic obstructive pulmonary disease) Confirmed on PFT 09/27/16. She was unable to start Spiriva due to cost. We will try starting her on Incruse once a day to see if she benefots. She will obtain her med formulary so we can review and pick best BD regimen going forward  Levy Pupa, MD, PhD 11/13/2016, 3:24 PM [facility] [phone] or if no answer [phone]

## 2016-11-14 ENCOUNTER — Ambulatory Visit (INDEPENDENT_AMBULATORY_CARE_PROVIDER_SITE_OTHER): Payer: 59 | Admitting: Psychiatry

## 2016-11-14 ENCOUNTER — Other Ambulatory Visit: Payer: Self-pay

## 2016-11-14 DIAGNOSIS — F331 Major depressive disorder, recurrent, moderate: Secondary | ICD-10-CM

## 2016-11-14 DIAGNOSIS — J449 Chronic obstructive pulmonary disease, unspecified: Secondary | ICD-10-CM
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
## 2016-11-15 ENCOUNTER — Other Ambulatory Visit: Payer: Medicare Other | Admitting: *Deleted

## 2016-11-15 DIAGNOSIS — I25810 Atherosclerosis of coronary artery bypass graft(s) without angina pectoris: Secondary | ICD-10-CM

## 2016-11-15 DIAGNOSIS — E032 Hypothyroidism due to medicaments and other exogenous substances: Secondary | ICD-10-CM

## 2016-11-15 DIAGNOSIS — I480 Paroxysmal atrial fibrillation: Secondary | ICD-10-CM

## 2016-11-15 IMAGING — CR DG CHEST 1V PORT
1 series · 1 of 1 positions shown · non-contrast
Comparison: March 03, 2014

CLINICAL DATA: Chest pain and difficulty breathing

EXAM:
PORTABLE CHEST - 1 VIEW

[AP]
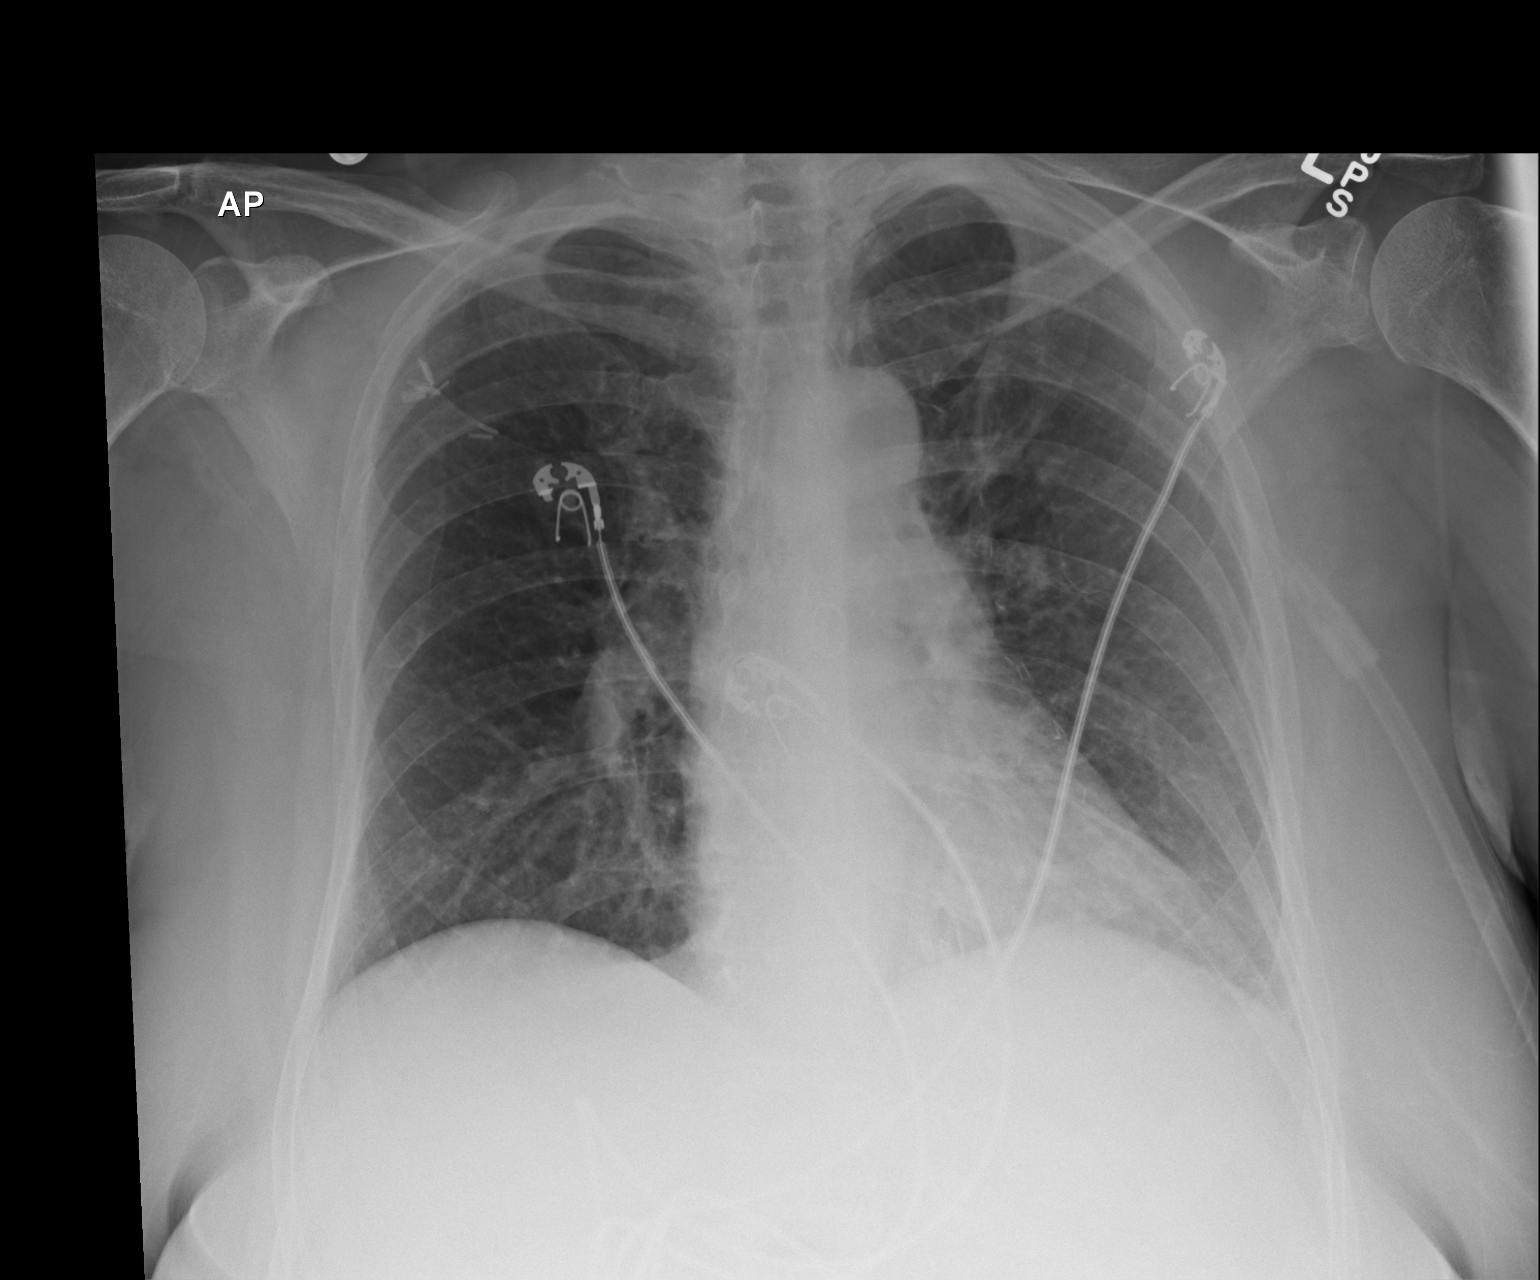

[1 of 1 positions shown; findings below may reference images not displayed]

FINDINGS: There is no edema or consolidation. The heart size and pulmonary
vascularity are normal. No adenopathy. Patient is status post
coronary artery bypass grafting.
IMPRESSION: No edema or consolidation.

## 2016-11-17 LAB — BASIC METABOLIC PANEL
BUN/Creatinine Ratio: 18 (ref 9–23)
BUN: 20 mg/dL (ref 6–24)
CO2: 20 mmol/L (ref 18–29)
Calcium: 8.4 mg/dL — ABNORMAL LOW (ref 8.7–10.2)
Chloride: 105 mmol/L (ref 96–106)
Creatinine, Ser: 1.13 mg/dL — ABNORMAL HIGH (ref 0.57–1.00)
GFR calc Af Amer: 61 mL/min/{1.73_m2} (ref >59)
GFR calc non Af Amer: 53 mL/min/{1.73_m2} — ABNORMAL LOW (ref >59)
Glucose: 99 mg/dL (ref 65–99)
Potassium: 3.7 mmol/L (ref 3.5–5.2)
Sodium: 142 mmol/L (ref 134–144)

## 2016-11-17 LAB — MAGNESIUM: Magnesium: 1.2 mg/dL — ABNORMAL LOW (ref 1.6–2.3)

## 2016-11-17 LAB — TSH: TSH: 0.514 u[IU]/mL (ref 0.450–4.500)

## 2016-11-18 ENCOUNTER — Other Ambulatory Visit (HOSPITAL_COMMUNITY): Payer: 59 | Attending: Psychiatry | Admitting: Psychiatry

## 2016-11-18 ENCOUNTER — Encounter (HOSPITAL_COMMUNITY): Payer: Self-pay | Admitting: Psychiatry

## 2016-11-18 DIAGNOSIS — I2510 Atherosclerotic heart disease of native coronary artery without angina pectoris: Secondary | ICD-10-CM | POA: Insufficient documentation

## 2016-11-18 DIAGNOSIS — Z87891 Personal history of nicotine dependence: Secondary | ICD-10-CM | POA: Insufficient documentation

## 2016-11-18 DIAGNOSIS — F331 Major depressive disorder, recurrent, moderate: Secondary | ICD-10-CM

## 2016-11-18 DIAGNOSIS — J449 Chronic obstructive pulmonary disease, unspecified: Secondary | ICD-10-CM | POA: Insufficient documentation

## 2016-11-18 DIAGNOSIS — Z8249 Family history of ischemic heart disease and other diseases of the circulatory system: Secondary | ICD-10-CM | POA: Insufficient documentation

## 2016-11-18 DIAGNOSIS — Z79899 Other long term (current) drug therapy: Secondary | ICD-10-CM | POA: Insufficient documentation

## 2016-11-18 DIAGNOSIS — Z818 Family history of other mental and behavioral disorders: Secondary | ICD-10-CM | POA: Insufficient documentation

## 2016-11-18 DIAGNOSIS — Z882 Allergy status to sulfonamides status: Secondary | ICD-10-CM | POA: Insufficient documentation

## 2016-11-18 DIAGNOSIS — Z885 Allergy status to narcotic agent status: Secondary | ICD-10-CM | POA: Insufficient documentation

## 2016-11-18 DIAGNOSIS — Z951 Presence of aortocoronary bypass graft: Secondary | ICD-10-CM | POA: Insufficient documentation

## 2016-11-18 DIAGNOSIS — Z811 Family history of alcohol abuse and dependence: Secondary | ICD-10-CM | POA: Insufficient documentation

## 2016-11-18 DIAGNOSIS — Z7902 Long term (current) use of antithrombotics/antiplatelets: Secondary | ICD-10-CM | POA: Insufficient documentation

## 2016-11-18 DIAGNOSIS — Z8262 Family history of osteoporosis: Secondary | ICD-10-CM | POA: Insufficient documentation

## 2016-11-18 DIAGNOSIS — F332 Major depressive disorder, recurrent severe without psychotic features: Secondary | ICD-10-CM | POA: Insufficient documentation

## 2016-11-18 DIAGNOSIS — Z880 Allergy status to penicillin: Secondary | ICD-10-CM | POA: Insufficient documentation

## 2016-11-18 DIAGNOSIS — I69354 Hemiplegia and hemiparesis following cerebral infarction affecting left non-dominant side: Secondary | ICD-10-CM | POA: Insufficient documentation

## 2016-11-18 DIAGNOSIS — Z825 Family history of asthma and other chronic lower respiratory diseases: Secondary | ICD-10-CM | POA: Insufficient documentation

## 2016-11-18 DIAGNOSIS — I10 Essential (primary) hypertension: Secondary | ICD-10-CM | POA: Insufficient documentation

## 2016-11-18 DIAGNOSIS — Z833 Family history of diabetes mellitus: Secondary | ICD-10-CM | POA: Insufficient documentation

## 2016-11-18 DIAGNOSIS — Z8261 Family history of arthritis: Secondary | ICD-10-CM | POA: Insufficient documentation

## 2016-11-18 DIAGNOSIS — Z955 Presence of coronary angioplasty implant and graft: Secondary | ICD-10-CM | POA: Insufficient documentation

## 2016-11-18 DIAGNOSIS — I481 Persistent atrial fibrillation: Secondary | ICD-10-CM | POA: Insufficient documentation

## 2016-11-18 DIAGNOSIS — Z8614 Personal history of Methicillin resistant Staphylococcus aureus infection: Secondary | ICD-10-CM | POA: Insufficient documentation

## 2016-11-18 DIAGNOSIS — Z888 Allergy status to other drugs, medicaments and biological substances status: Secondary | ICD-10-CM | POA: Insufficient documentation

## 2016-11-18 DIAGNOSIS — Z7951 Long term (current) use of inhaled steroids: Secondary | ICD-10-CM | POA: Insufficient documentation

## 2016-11-18 DIAGNOSIS — E785 Hyperlipidemia, unspecified: Secondary | ICD-10-CM | POA: Insufficient documentation

## 2016-11-18 DIAGNOSIS — E039 Hypothyroidism, unspecified: Secondary | ICD-10-CM | POA: Insufficient documentation

## 2016-11-18 DIAGNOSIS — K219 Gastro-esophageal reflux disease without esophagitis: Secondary | ICD-10-CM | POA: Insufficient documentation
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
## 2016-11-18 NOTE — Progress Notes (Unsigned)
Comprehensive Clinical Assessment (CCA) Note  11/18/2016 DAVITA FEDERICO [id number]  Visit Diagnosis:   No diagnosis found.    CCA Part One  Part One has been completed on paper by the patient.  (See scanned document in Chart Review)  CCA Part Two A  Intake/Chief Complaint:  CCA Intake With Chief Complaint CCA Part Two Date: 11/18/16 Chief Complaint/Presenting Problem: Patient is struggling both emotionally and financially due to the many losses she has suffered since her stroke in 2012. This current depressive episode began last Spring, and has worsened in recent months. This is in part because of the many financial stressors she is enduring, and her disrupted sleep schedule. At present she helps take care of her sons dogs at night, and they tend to bark when she goes to sleep. During the day she struggles to stay awake, and often just stays in bed and sleeps. Individual's Strengths: good sense of humor, intelligent, linear communicator, resilient  Mental Health Symptoms Depression:     Mania:     Anxiety:      Psychosis:     Trauma:  Trauma: N/A  Obsessions:     Compulsions:  Compulsions: N/A  Inattention:  Inattention: Forgetful, Disorganized (These are due to her stroke. )  Hyperactivity/Impulsivity:     Oppositional/Defiant Behaviors:     Borderline Personality:     Other Mood/Personality Symptoms:      Mental Status Exam Appearance and self-care  Stature:     Weight:     Clothing:     Grooming:     Cosmetic use:     Posture/gait:     Motor activity:     Sensorium  Attention:     Concentration:     Orientation:     Recall/memory:     Affect and Mood  Affect:  Affect: Tearful  Mood:     Relating  Eye contact:     Facial expression:     Attitude toward examiner:     Thought and Language  Speech flow:    Thought content:     Preoccupation:     Hallucinations:     Organization:     Company secretary of Knowledge:     Intelligence:     Abstraction:      Judgement:     Dance movement psychotherapist:     Insight:     Decision Making:     Social Functioning  Social Maturity:     Social Judgement:     Stress  Stressors:     Coping Ability:     Skill Deficits:     Supports:      Family and Psychosocial History:    Childhood History:     CCA Part Two B  Employment/Work Situation:    Education:    Religion:    Leisure/Recreation: Leisure / Recreation Leisure and Hobbies: Use to write creatively.   Exercise/Diet:    CCA Part Two C  Alcohol/Drug Use:                        CCA Part Three  ASAM's:  Six Dimensions of Multidimensional Assessment  Dimension 1:  Acute Intoxication and/or Withdrawal Potential:     Dimension 2:  Biomedical Conditions and Complications:     Dimension 3:  Emotional, Behavioral, or Cognitive Conditions and Complications:     Dimension 4:  Readiness to Change:     Dimension 5:  Relapse, Continued use, or  Continued Problem Potential:     Dimension 6:  Recovery/Living Environment:      Substance use Disorder (SUD)    Social Function:     Stress:  Stress Patient Takes Medications The Way The Doctor Instructed?: No (Pt often forgets to take medications during the day)  Risk Assessment- Self-Harm Potential: Risk Assessment For Self-Harm Potential Thoughts of Self-Harm: Vague current thoughts (Passive SI. No plan or means. )  Risk Assessment -Dangerous to Others Potential:    DSM5 Diagnoses: Patient Active Problem List   Diagnosis Date Noted  . Pre-procedure lab exam 09/13/2016  . Generalized headaches 07/04/2016  . Protein-calorie malnutrition, severe (HCC) 04/03/2015  . Chronic diastolic CHF (congestive heart failure), NYHA class 2 (HCC) 01/24/2015  . Demand ischemia secondary to AF with RVR 12/13/2014  . CKD (chronic kidney disease), stage III 11/11/2014  . Hypokalemia 05/22/2014  . Mood disorder (HCC) 03/20/2014  . HTN (hypertension) 03/20/2014  . Anemia, unspecified  03/20/2014  . Tobacco use disorder 03/20/2014  . Atrial fibrillation with RVR (HCC) 03/03/2014  . Hypotension 03/03/2014  . Pulmonary hypertension 03/03/2014  . COPD (chronic obstructive pulmonary disease) (HCC) 03/03/2014  . History of right MCA stroke   . Long-term (current) use of anticoagulants   . Cardiomyopathy-h/o tachycardia mediated-EF 65% per echo March 2016 02/18/2014  . Hyperlipidemia 02/18/2014  . CAD '07, LAD PCI 2012, SVG-PDA PTCA 11/10/14 02/16/2014  . Chronic diastolic heart failure (HCC)   . Pulmonary nodules   . Hypothyroidism   . OSA- C-pap intol     Patient Centered Plan: Patient is on the following Treatment Plan(s):  {CHL AMB BH OP Treatment Plans:[id number]}  Recommendations for Services/Supports/Treatments:    Treatment Plan Summary:    Referrals to Alternative Service(s): Referred to Alternative Service(s):   Place:   Date:   Time:    Referred to Alternative Service(s):   Place:   Date:   Time:    Referred to Alternative Service(s):   Place:   Date:   Time:    Referred to Alternative Service(s):   Place:   Date:   Time:     [place], RITA

## 2016-11-19 ENCOUNTER — Other Ambulatory Visit (HOSPITAL_COMMUNITY): Payer: 59 | Admitting: Psychiatry

## 2016-11-19 ENCOUNTER — Encounter (HOSPITAL_COMMUNITY): Payer: Self-pay | Admitting: Psychiatry

## 2016-11-19 DIAGNOSIS — F332 Major depressive disorder, recurrent severe without psychotic features: Secondary | ICD-10-CM | POA: Insufficient documentation

## 2016-11-19 NOTE — Progress Notes (Signed)
    Daily Group Progress Note  Program: IOP  Group Time: 9:00-12:00  Participation Level: Active  Behavioral Response: Appropriate  Type of Therapy:  Group Therapy  Summary of Progress: Pt.'s first day in group. Pt. Met with case manager and psychiatrist and introduced herself to the group.    Nancie Neas, LPC
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
## 2016-11-20 ENCOUNTER — Other Ambulatory Visit (HOSPITAL_COMMUNITY): Payer: 59 | Admitting: Psychiatry

## 2016-11-20 DIAGNOSIS — Z8261 Family history of arthritis: Secondary | ICD-10-CM | POA: Diagnosis not present

## 2016-11-20 DIAGNOSIS — I481 Persistent atrial fibrillation: Secondary | ICD-10-CM | POA: Diagnosis not present

## 2016-11-20 DIAGNOSIS — Z818 Family history of other mental and behavioral disorders: Secondary | ICD-10-CM | POA: Diagnosis not present

## 2016-11-20 DIAGNOSIS — Z8614 Personal history of Methicillin resistant Staphylococcus aureus infection: Secondary | ICD-10-CM | POA: Diagnosis not present

## 2016-11-20 DIAGNOSIS — Z79899 Other long term (current) drug therapy: Secondary | ICD-10-CM | POA: Diagnosis not present

## 2016-11-20 DIAGNOSIS — I10 Essential (primary) hypertension: Secondary | ICD-10-CM | POA: Diagnosis not present

## 2016-11-20 DIAGNOSIS — F332 Major depressive disorder, recurrent severe without psychotic features: Secondary | ICD-10-CM | POA: Diagnosis present

## 2016-11-20 DIAGNOSIS — E785 Hyperlipidemia, unspecified: Secondary | ICD-10-CM | POA: Diagnosis not present

## 2016-11-20 DIAGNOSIS — K219 Gastro-esophageal reflux disease without esophagitis: Secondary | ICD-10-CM | POA: Diagnosis not present

## 2016-11-20 DIAGNOSIS — Z811 Family history of alcohol abuse and dependence: Secondary | ICD-10-CM | POA: Diagnosis not present

## 2016-11-20 DIAGNOSIS — Z7951 Long term (current) use of inhaled steroids: Secondary | ICD-10-CM | POA: Diagnosis not present

## 2016-11-20 DIAGNOSIS — Z8249 Family history of ischemic heart disease and other diseases of the circulatory system: Secondary | ICD-10-CM | POA: Diagnosis not present

## 2016-11-20 DIAGNOSIS — E039 Hypothyroidism, unspecified: Secondary | ICD-10-CM | POA: Diagnosis not present

## 2016-11-20 DIAGNOSIS — Z7902 Long term (current) use of antithrombotics/antiplatelets: Secondary | ICD-10-CM | POA: Diagnosis not present

## 2016-11-20 DIAGNOSIS — I2510 Atherosclerotic heart disease of native coronary artery without angina pectoris: Secondary | ICD-10-CM | POA: Diagnosis not present

## 2016-11-20 DIAGNOSIS — Z833 Family history of diabetes mellitus: Secondary | ICD-10-CM | POA: Diagnosis not present

## 2016-11-20 DIAGNOSIS — I69354 Hemiplegia and hemiparesis following cerebral infarction affecting left non-dominant side: Secondary | ICD-10-CM | POA: Diagnosis not present

## 2016-11-20 DIAGNOSIS — J449 Chronic obstructive pulmonary disease, unspecified: Secondary | ICD-10-CM | POA: Diagnosis not present

## 2016-11-21 ENCOUNTER — Other Ambulatory Visit (HOSPITAL_COMMUNITY): Payer: 59 | Admitting: Psychiatry

## 2016-11-21 DIAGNOSIS — F332 Major depressive disorder, recurrent severe without psychotic features: Secondary | ICD-10-CM | POA: Diagnosis not present

## 2016-11-21 NOTE — Progress Notes (Signed)
No great change in the depression.  [name] meets the criteria for TMS and it should not interfere with the migraine and might improve it.  Chronic pain and difficulty staying awake might be issues for the success of the treatment.  Overall it is worth the attempt as nothing else has helped.  Refer to TMS treatment after IOP or sooner if it can be arranged

## 2016-11-21 NOTE — Progress Notes (Signed)
    Daily Group Progress Note  Program: IOP  Group Time: 9:00-12:00  Participation Level: Active  Behavioral Response: Appropriate  Type of Therapy:  Group Therapy  Summary of Progress: Pt. Presented as talkative, engaged in the group process. Pt. Shared that she followed up on connecting with a local friend from college and the friend e-mailed her back. Pt. Discussed poor living conditions that affect her physical and emotional well-being and was open to suggestions from the group about seeking housing referrals. Pt. Participated in discussion about use of the Renae Fickle RAIN method for meditation.     Shaune Pollack, LPC

## 2016-11-21 NOTE — Progress Notes (Signed)
    Daily Group Progress Note  Program: IOP  Group Time: 9:00-12:00  Participation Level: Active  Behavioral Response: Appropriate  Type of Therapy:  Group Therapy  Summary of Progress: Pt. Presented as engaged in the group process, responsive to prompts, sleepy. Pt. Participated in reflective reading about finding symbols to provide peace and sense of security in difficult times. Pt. Discussed that she lost all of her physical possessions after her stroke, but she was able to connect with the smell of hazelnut coffee that connects her with happier times in her life when she was married and happy and her children were young. Participated in yoga therapy with Forde Radon.     Shaune Pollack, LPC

## 2016-11-22 ENCOUNTER — Other Ambulatory Visit (HOSPITAL_COMMUNITY): Payer: 59

## 2016-11-25 ENCOUNTER — Other Ambulatory Visit (HOSPITAL_COMMUNITY): Payer: 59 | Admitting: Psychiatry

## 2016-11-25 DIAGNOSIS — F332 Major depressive disorder, recurrent severe without psychotic features: Secondary | ICD-10-CM

## 2016-11-26 ENCOUNTER — Other Ambulatory Visit (HOSPITAL_COMMUNITY): Payer: 59 | Admitting: Psychiatry

## 2016-11-27 ENCOUNTER — Other Ambulatory Visit (HOSPITAL_COMMUNITY): Payer: 59 | Admitting: Psychiatry

## 2016-11-27 NOTE — Progress Notes (Signed)
    Daily Group Progress Note  Program: IOP  Group Time: 9:00-12:00   Participation Level:active    Behavioral Response:  engaged   Type of Therapy:  group therapy   Summary of Progress: Pt. reported feeling frustrated and disappointed over the weekend when she learned that her son had gotten arrested.  This impacts her son's life and well as her grandson's life.  Pt. connected to the family boundaries conversation in that she has had difficulties with her sister and mother.    Shaune Pollack, LPC

## 2016-11-28 ENCOUNTER — Other Ambulatory Visit (HOSPITAL_COMMUNITY): Payer: 59

## 2016-11-28 ENCOUNTER — Ambulatory Visit: Payer: Self-pay | Admitting: Cardiology

## 2016-11-29 ENCOUNTER — Other Ambulatory Visit (HOSPITAL_COMMUNITY): Payer: 59 | Admitting: Psychiatry

## 2016-12-02 ENCOUNTER — Other Ambulatory Visit (HOSPITAL_COMMUNITY): Payer: 59

## 2016-12-03 ENCOUNTER — Other Ambulatory Visit (HOSPITAL_COMMUNITY): Payer: 59 | Admitting: *Deleted

## 2016-12-03 NOTE — Progress Notes (Signed)
    Daily Group Progress Note  Program: IOP  Group Time: 9:00-12:00   Participation Level: Active    Behavioral Response:  Engaged   Type of Therapy:  Group therapy   Summary of Progress: Patient expressed her grief over what her youngest son is experiencing and how it saddens her while also expressing her own negative thinking and ongoing tendency to blame herself for things that are out of her control. Patient expressed dismay over being unable to 'fix it or it least make it better for grandson and his mother. Other in group offered feedback and patient processed hiow it might feel to simply reach out to them to let them know she cares.  Pt. related well to 'all or nothing/back and white' thinking.    Tina Bern, LCSW

## 2016-12-04 ENCOUNTER — Other Ambulatory Visit (HOSPITAL_COMMUNITY): Payer: 59 | Admitting: Psychiatry

## 2016-12-04 DIAGNOSIS — F332 Major depressive disorder, recurrent severe without psychotic features: Secondary | ICD-10-CM | POA: Diagnosis not present

## 2016-12-04 NOTE — Progress Notes (Signed)
Daily Group Progress Note  Program: IOP Group Time: 9:00-12:00   Participation Level:  Active   Behavioral Response:  Engaged   Type of Therapy:  Group therapy  Summary of Progress: Pt was attentive to insights shared by others and celebrated recent achievements of other group members.  Patient participated in self compassion exercise and shared insights into the polar opposites of how she comforts others and how she judges herself. Patient reports she continues to use old tapes (mostly those ingrained by her father) into her statements about herself.    Carney Bern, LCSW

## 2016-12-05 ENCOUNTER — Other Ambulatory Visit (HOSPITAL_COMMUNITY): Payer: 59 | Admitting: Psychiatry

## 2016-12-05 NOTE — Progress Notes (Signed)
BH IOP DISCHARGE NOTE  Patient:  [name] DOB:  12-07-1955  Date of Admission:11/18/2016   Date of Discharge:12/05/2016   Reason for Admission:depression  IOP Course:attended and participated.  Hard to judge her progress as she remains depressed but she related over the phone today that she does feel much better.  Still depressed but not suicidal and has more hope that things will improve  Mental Status at Discharge:no suicidal thoughts.  Depression moderate  Diagnosis: severe major depression, recurrent without psychotic features  Level of Care:  IOP  Discharge destination: has appointment with her psychiatrist and therapist     Comments:  none  The patient received suicide prevention pamphlet:  Yes   Carolanne Grumbling, MD

## 2016-12-05 NOTE — Progress Notes (Signed)
D:  Pt requesting discharge today; although she didn't return for MH-IOP today.  Pt reported transportation issues.  States she is feeling much better and the groups helped her.  Denies SI/HI or A/V hallucinations.  A:  D/C today.  F/U with Dr. Donell Beers and Boneta Lucks, PhD.  Encouraged support groups.  R:  Pt receptive.   Jeri Modena, M.Ed, CNA

## 2016-12-06 ENCOUNTER — Other Ambulatory Visit (HOSPITAL_COMMUNITY): Payer: 59

## 2016-12-09 ENCOUNTER — Other Ambulatory Visit (HOSPITAL_COMMUNITY): Payer: 59

## 2016-12-10 ENCOUNTER — Other Ambulatory Visit (HOSPITAL_COMMUNITY): Payer: 59

## 2016-12-11 ENCOUNTER — Other Ambulatory Visit (HOSPITAL_COMMUNITY): Payer: 59 | Attending: Psychiatry | Admitting: Emergency Medicine

## 2016-12-11 ENCOUNTER — Other Ambulatory Visit (HOSPITAL_COMMUNITY): Payer: 59

## 2016-12-11 DIAGNOSIS — F332 Major depressive disorder, recurrent severe without psychotic features: Secondary | ICD-10-CM | POA: Diagnosis not present

## 2016-12-11 NOTE — Progress Notes (Signed)
Pt reported to [facility] for cortical mapping and motor threshold determination for Repetitive Transcranial Magnetic Stimulation treatment for Major Depressive Disorder. Pt completed a PHQ-9 with a score of 20( severe depression). Pt also completed a Beck's Depression Inventory with a score of 33(severe depression). Prior to procedure, pt signed an informed consent agreement for TMS treatment. Pt's treatment area was found by applying single pulses to her left motor cortex, hunting along the anterior/posterior plane and along the superior oblique angle until the best motor response was elicited from the pt's right thumb. The best response was observed at 4.6 AP and 43degrees SOA, with a coil angle of 5degrees. Pt's motor threshold was calculated using the [employer]'s proprietary MT Assist algorithm, which produced a calculated motor threshold of .85SMT. Per these findings, pt's treatment parameters are as follows: A/P -- 11.1 cm, SOA -- 43degrees, Coil Angle -- 25 degrees, Motor Threshold -- .87SMT. With these parameters, the pt will receive 36 sessions of TMS according to the following protocol: 3000 pulses per session, with stimulation in bursts of pulses lasting 4 seconds at a frequency of 10 Hz, separated by 26 seconds of rest. After determining pt's tx parameters, coil was moved to the treatment location, and the first burst of pulses was applied at a reduced power of 80%MT. Pt reported no complaints, and stated that the stimulation was tolerable, so the first tx session was given to the pt. Stimulation power was gradually titrated up from 80%MT to 120%MT. Pt tolerated tx well. Pt left with no complaints post-tx. Pt departed from clinic without issue.

## 2016-12-12 ENCOUNTER — Other Ambulatory Visit (INDEPENDENT_AMBULATORY_CARE_PROVIDER_SITE_OTHER): Payer: 59 | Admitting: Emergency Medicine

## 2016-12-12 ENCOUNTER — Other Ambulatory Visit (HOSPITAL_COMMUNITY): Payer: 59

## 2016-12-12 DIAGNOSIS — F332 Major depressive disorder, recurrent severe without psychotic features: Secondary | ICD-10-CM | POA: Diagnosis not present

## 2016-12-12 NOTE — Progress Notes (Signed)
Patient reported to [facility] for Repetitive Transcranial Magnetic Stimulation treatment for severe episode of recurrent major depressive disorder, without psychotic features. Patient presented with appropriate affect, level mood and denied any suicidal or homicidal ideations. Patient denies any other current symptoms and remains optimistic with continued TMS treatment. Patient reported no change in alcohol/substane use, caffeine consumption, sleep pattern or metal implant status since previous treatment. Patient engaged in conversation and watched TVduring treatment Power remained at 120% for the duration of treatment after initial increase with five impulse series. Patient reported no complaints, pain, discomfort or problems with treatment throughout session and completed session with no noted concerns.Patient departed post-treatment with no reported concerns. 

## 2016-12-13 ENCOUNTER — Other Ambulatory Visit (HOSPITAL_COMMUNITY): Payer: 59

## 2016-12-13 ENCOUNTER — Other Ambulatory Visit (INDEPENDENT_AMBULATORY_CARE_PROVIDER_SITE_OTHER): Payer: 59 | Admitting: Emergency Medicine

## 2016-12-13 DIAGNOSIS — F332 Major depressive disorder, recurrent severe without psychotic features: Secondary | ICD-10-CM | POA: Diagnosis not present

## 2016-12-13 NOTE — Progress Notes (Signed)
Patient reported to [facility] for Repetitive Transcranial Magnetic Stimulation treatment for severe episode of recurrent major depressive disorder, without psychotic features. Patient presented with appropriate affect, level mood and denied any suicidal or homicidal ideations. Patient denies any other current symptoms and remains optimistic with continued TMS treatment. Patient reported no change in alcohol/substane use, caffeine consumption, sleep pattern or metal implant status since previous treatment. Patient engaged in conversation and watched TVduring treatment Power remained at 120% for the duration of treatment after initial increase with five impulse series. Patient reported no complaints, pain, discomfort or problems with treatment throughout session and completed session with no noted concerns.Patient departed post-treatment with no reported concerns. 

## 2016-12-16 ENCOUNTER — Other Ambulatory Visit (INDEPENDENT_AMBULATORY_CARE_PROVIDER_SITE_OTHER): Payer: 59 | Admitting: Emergency Medicine

## 2016-12-16 ENCOUNTER — Other Ambulatory Visit (HOSPITAL_COMMUNITY): Payer: 59

## 2016-12-16 DIAGNOSIS — F332 Major depressive disorder, recurrent severe without psychotic features: Secondary | ICD-10-CM

## 2016-12-16 NOTE — Progress Notes (Signed)
Patient reported to [facility] for Repetitive Transcranial Magnetic Stimulation treatment for severe episode of recurrent major depressive disorder, without psychotic features. Patient presented with appropriate affect, level mood and denied any suicidal or homicidal ideations. Patient denies any other current symptoms and remains optimistic with continued TMS treatment. Patient reported no change in alcohol/substane use, caffeine consumption, sleep pattern or metal implant status since previous treatment. Patient engaged in conversation and watched TVduring treatment Power remained at 120% for the duration of treatment after initial increase with five impulse series. Patient reported no complaints, pain, discomfort or problems with treatment throughout session and completed session with no noted concerns.Patient departed post-treatment with no reported concerns.

## 2016-12-17 ENCOUNTER — Other Ambulatory Visit (HOSPITAL_COMMUNITY): Payer: 59

## 2016-12-17 ENCOUNTER — Other Ambulatory Visit (INDEPENDENT_AMBULATORY_CARE_PROVIDER_SITE_OTHER): Payer: 59 | Admitting: Emergency Medicine

## 2016-12-17 DIAGNOSIS — F332 Major depressive disorder, recurrent severe without psychotic features: Secondary | ICD-10-CM

## 2016-12-17 NOTE — Progress Notes (Signed)
Patient reported to  [facility] for Repetitive Transcranial Magnetic Stimulation treatment for severe episode of recurrent major depressive disorder, without psychotic features. Patient presented with appropriate affect, level mood and denied any suicidal or homicidal ideations. Patient denies any other current symptoms and remains optimistic with continued TMS treatment. Patient reported no change in alcohol/substane use, caffeine consumption, sleep pattern or metal implant status since previous treatment. Patient engaged in conversation and watched TVduring treatment Power remained at 120% for the duration of treatment after initial increase with five impulse series. Patient reported no complaints, pain, discomfort or problems with treatment throughout session and completed session with no noted concerns.Patient departed post-treatment with no reported concerns. 

## 2016-12-18 ENCOUNTER — Other Ambulatory Visit (INDEPENDENT_AMBULATORY_CARE_PROVIDER_SITE_OTHER): Payer: 59 | Admitting: Emergency Medicine

## 2016-12-18 ENCOUNTER — Other Ambulatory Visit (HOSPITAL_COMMUNITY): Payer: 59

## 2016-12-18 DIAGNOSIS — F332 Major depressive disorder, recurrent severe without psychotic features: Secondary | ICD-10-CM | POA: Diagnosis not present

## 2016-12-18 NOTE — Progress Notes (Signed)
Patient reported to [facility] for Repetitive Transcranial Magnetic Stimulation treatment for severe episode of recurrent major depressive disorder, without psychotic features. Patient presented with appropriate affect, level mood and denied any suicidal or homicidal ideations. Patient denies any other current symptoms and remains optimistic with continued TMS treatment. Patient reported no change in alcohol/substane use, caffeine consumption, sleep pattern or metal implant status since previous treatment. Patient engaged in conversation and watched TVduring treatment Power remained at 120% for the duration of treatment after initial increase with five impulse series. Patient reported no complaints, pain, discomfort or problems with treatment throughout session and completed session with no noted concerns.Patient departed post-treatment with no reported concerns. 

## 2016-12-19 ENCOUNTER — Other Ambulatory Visit (INDEPENDENT_AMBULATORY_CARE_PROVIDER_SITE_OTHER): Payer: 59 | Admitting: Emergency Medicine

## 2016-12-19 ENCOUNTER — Other Ambulatory Visit (HOSPITAL_COMMUNITY): Payer: 59

## 2016-12-19 DIAGNOSIS — F332 Major depressive disorder, recurrent severe without psychotic features: Secondary | ICD-10-CM | POA: Diagnosis not present

## 2016-12-19 NOTE — Progress Notes (Signed)
Patient reported to [facility] for Repetitive Transcranial Magnetic Stimulation treatment for severe episode of recurrent major depressive disorder, without psychotic features. Patient presented with appropriate affect, level mood and denied any suicidal or homicidal ideations. Patient denies any other current symptoms and remains optimistic with continued TMS treatment. Patient reported no change in alcohol/substane use, caffeine consumption, sleep pattern or metal implant status since previous treatment. Patient engaged in conversation and watched TVduring treatment Power remained at 120% for the duration of treatment after initial increase with five impulse series. Patient reported no complaints, pain, discomfort or problems with treatment throughout session and completed session with no noted concerns.Patient departed post-treatment with no reported concerns. 

## 2016-12-20 ENCOUNTER — Other Ambulatory Visit (HOSPITAL_COMMUNITY): Payer: 59

## 2016-12-20 ENCOUNTER — Other Ambulatory Visit (INDEPENDENT_AMBULATORY_CARE_PROVIDER_SITE_OTHER): Payer: 59 | Admitting: Emergency Medicine

## 2016-12-20 DIAGNOSIS — F332 Major depressive disorder, recurrent severe without psychotic features: Secondary | ICD-10-CM

## 2016-12-20 NOTE — Progress Notes (Signed)
Patient reported to [facility] for Repetitive Transcranial Magnetic Stimulation treatment for severe episode of recurrent major depressive disorder, without psychotic features. Patient presented with appropriate affect, level mood and denied any suicidal or homicidal ideations. Patient denies any other current symptoms and remains optimistic with continued TMS treatment. Patient reported no change in alcohol/substane use, caffeine consumption, sleep pattern or metal implant status since previous treatment. Patient engaged in conversation and watched TVduring treatment Power remained at 120% for the duration of treatment after initial increase with five impulse series. Patient reported no complaints, pain, discomfort or problems with treatment throughout session and completed session with no noted concerns.Patient departed post-treatment with no reported concerns. 

## 2016-12-23 ENCOUNTER — Other Ambulatory Visit (INDEPENDENT_AMBULATORY_CARE_PROVIDER_SITE_OTHER): Payer: 59 | Admitting: Emergency Medicine

## 2016-12-23 ENCOUNTER — Other Ambulatory Visit (HOSPITAL_COMMUNITY): Payer: 59

## 2016-12-23 DIAGNOSIS — F332 Major depressive disorder, recurrent severe without psychotic features: Secondary | ICD-10-CM | POA: Diagnosis not present

## 2016-12-23 NOTE — Progress Notes (Signed)
Patient reported to [facility] for Repetitive Transcranial Magnetic Stimulation treatment for severe episode of recurrent major depressive disorder, without psychotic features. Patient presented with appropriate affect, level mood and denied any suicidal or homicidal ideations. Patient denies any other current symptoms and remains optimistic with continued TMS treatment. Patient reported no change in alcohol/substane use, caffeine consumption, sleep pattern or metal implant status since previous treatment. Patient engaged in conversation and watched TVduring treatment Power remained at 120% for the duration of treatment after initial increase with five impulse series. Patient reported no complaints, pain, discomfort or problems with treatment throughout session and completed session with no noted concerns.Patient departed post-treatment with no reported concerns. 

## 2016-12-24 ENCOUNTER — Encounter (HOSPITAL_COMMUNITY): Payer: Self-pay

## 2016-12-24 ENCOUNTER — Other Ambulatory Visit (HOSPITAL_COMMUNITY): Payer: 59

## 2016-12-25 ENCOUNTER — Other Ambulatory Visit (INDEPENDENT_AMBULATORY_CARE_PROVIDER_SITE_OTHER): Payer: 59 | Admitting: Emergency Medicine

## 2016-12-25 ENCOUNTER — Other Ambulatory Visit (HOSPITAL_COMMUNITY): Payer: 59

## 2016-12-25 DIAGNOSIS — F332 Major depressive disorder, recurrent severe without psychotic features: Secondary | ICD-10-CM

## 2016-12-25 NOTE — Progress Notes (Signed)
Patient reported to [facility] for Repetitive Transcranial Magnetic Stimulation treatment for severe episode of recurrent major depressive disorder, without psychotic features. Patient presented with appropriate affect, level mood and denied any suicidal or homicidal ideations. Patient denies any other current symptoms and remains optimistic with continued TMS treatment. Patient reported no change in alcohol/substane use, caffeine consumption, sleep pattern or metal implant status since previous treatment.Pt expressed that she has been experiencing slight changes, such as better sleep and being more active during the day. Pt completed PHQ-9 Questionnaire scoring a 2. Patient engaged in conversation. Power remained at 120% for the duration of treatment after initial increase with five impulse series. Patient reported no complaints, pain, discomfort or problems with treatment throughout session and completed session with no noted concerns.Patient departed post-treatment and did report any concerns.  

## 2016-12-26 ENCOUNTER — Other Ambulatory Visit (HOSPITAL_COMMUNITY): Payer: 59

## 2016-12-26 ENCOUNTER — Other Ambulatory Visit (INDEPENDENT_AMBULATORY_CARE_PROVIDER_SITE_OTHER): Payer: 59 | Admitting: Emergency Medicine

## 2016-12-26 DIAGNOSIS — F332 Major depressive disorder, recurrent severe without psychotic features: Secondary | ICD-10-CM | POA: Diagnosis not present

## 2016-12-26 NOTE — Progress Notes (Signed)
Patient reported to [facility] for Repetitive Transcranial Magnetic Stimulation treatment for severe episode of recurrent major depressive disorder, without psychotic features. Patient presented with appropriate affect, level mood and denied any suicidal or homicidal ideations. Patient denies any other current symptoms and remains optimistic with continued TMS treatment. Patient reported no change in alcohol/substane use, caffeine consumption, sleep pattern or metal implant status since previous treatment.Pt expressed that she has been experiencing slight changes, such as better sleep and being more active during the day. Pt completed PHQ-9 Questionnaire scoring a 2. Patient engaged in conversation. Power remained at 120% for the duration of treatment after initial increase with five impulse series. Patient reported no complaints, pain, discomfort or problems with treatment throughout session and completed session with no noted concerns.Patient departed post-treatment and did report any concerns.

## 2016-12-27 ENCOUNTER — Other Ambulatory Visit (HOSPITAL_COMMUNITY): Payer: 59

## 2016-12-27 ENCOUNTER — Other Ambulatory Visit (INDEPENDENT_AMBULATORY_CARE_PROVIDER_SITE_OTHER): Payer: 59 | Admitting: Emergency Medicine

## 2016-12-27 DIAGNOSIS — F332 Major depressive disorder, recurrent severe without psychotic features: Secondary | ICD-10-CM | POA: Diagnosis not present

## 2016-12-27 NOTE — Progress Notes (Signed)
Patient reported to [facility] for Repetitive Transcranial Magnetic Stimulation treatment for severe episode of recurrent major depressive disorder, without psychotic features. Patient presented with appropriate affect, level mood and denied any suicidal or homicidal ideations. Patient denies any other current symptoms and remains optimistic with continued TMS treatment. Patient reported no change in alcohol/substane use, caffeine consumption, sleep pattern or metal implant status since previous treatment. Patient engaged in conversation. Pt shared that she purchased a new watch and feels like a "new person." Pt has expressed that this treatment has been helping her thus far. During treatment pt expressed observations about her hand twitching. Writer made modifications. No hand movement was observed.  Power remained at 120% for the duration of treatment after initial increase with five impulse series. Patient reported no complaints, pain, discomfort or problems with treatment throughout session and completed session with no noted concerns.Patient departed post-treatment with no reported concerns.

## 2016-12-30 ENCOUNTER — Encounter (HOSPITAL_COMMUNITY): Payer: Self-pay

## 2016-12-30 ENCOUNTER — Other Ambulatory Visit (HOSPITAL_COMMUNITY): Payer: 59

## 2016-12-31 ENCOUNTER — Other Ambulatory Visit (HOSPITAL_COMMUNITY): Payer: 59

## 2016-12-31 ENCOUNTER — Ambulatory Visit: Payer: Self-pay | Admitting: Emergency Medicine

## 2016-12-31 ENCOUNTER — Other Ambulatory Visit (INDEPENDENT_AMBULATORY_CARE_PROVIDER_SITE_OTHER): Payer: 59 | Admitting: Emergency Medicine

## 2016-12-31 DIAGNOSIS — F332 Major depressive disorder, recurrent severe without psychotic features: Secondary | ICD-10-CM

## 2016-12-31 NOTE — Progress Notes (Signed)
Patient reported to [facility] for Repetitive Transcranial Magnetic Stimulation treatment for severe episode of recurrent major depressive disorder, without psychotic features. Patient presented with appropriate affect, level mood and denied any suicidal or homicidal ideations. Patient denies any other current symptoms and remains optimistic with continued TMS treatment. Patient reported no change in alcohol/substane use, caffeine consumption, sleep pattern or metal implant status since previous treatment. Patient engaged in conversation. Pt shared that she was not feeling well yesterday, therefore she had to miss yesterdays treatment.  Power remained at 120% for the duration of treatment after initial increase with five impulse series. Patient reported no complaints, pain, discomfort or problems with treatment throughout session and completed session with no noted concerns.Patient departed post-treatment with no reported concerns.

## 2017-01-01 ENCOUNTER — Other Ambulatory Visit (INDEPENDENT_AMBULATORY_CARE_PROVIDER_SITE_OTHER): Payer: 59 | Admitting: Emergency Medicine

## 2017-01-01 ENCOUNTER — Other Ambulatory Visit (HOSPITAL_COMMUNITY): Payer: 59

## 2017-01-01 DIAGNOSIS — F332 Major depressive disorder, recurrent severe without psychotic features: Secondary | ICD-10-CM

## 2017-01-01 NOTE — Progress Notes (Addendum)
Patient reported to [facility] for Repetitive Transcranial Magnetic Stimulation treatment for severe episode of recurrent major depressive disorder, without psychotic features. Patient presented with appropriate affect, level mood and denied any suicidal or homicidal ideations. Patient denies any other current symptoms and remains optimistic with continued TMS treatment. Patient reported no change in alcohol/substane use, caffeine consumption, sleep pattern or metal implant status since previous treatment. Patient engaged in conversation. Pt shared that she has stopped smoking - it has been about a couple of weeks.Power remained at 120% for the duration of treatment after initial increase with five impulse series. Patient reported no complaints, pain, discomfort or problems with treatment throughout session and completed session with no noted concerns.Patient departed post-treatment with no reported concerns.

## 2017-01-02 ENCOUNTER — Ambulatory Visit: Payer: Medicare Other | Admitting: Nurse Practitioner

## 2017-01-02 ENCOUNTER — Other Ambulatory Visit (INDEPENDENT_AMBULATORY_CARE_PROVIDER_SITE_OTHER): Payer: 59 | Admitting: Emergency Medicine

## 2017-01-02 ENCOUNTER — Other Ambulatory Visit (HOSPITAL_COMMUNITY): Payer: 59

## 2017-01-02 ENCOUNTER — Telehealth: Payer: Self-pay | Admitting: Cardiology

## 2017-01-02 DIAGNOSIS — F332 Major depressive disorder, recurrent severe without psychotic features: Secondary | ICD-10-CM | POA: Diagnosis not present

## 2017-01-02 MED ORDER — RIVAROXABAN 20 MG PO TABS: ORAL_TABLET | ORAL | 1 refills | Status: DC

## 2017-01-02 NOTE — Telephone Encounter (Signed)
New message     *STAT* If patient is at the pharmacy, call can be transferred to refill team.   1. Which medications need to be refilled? (please list name of each medication and dose if known) XARELTO 20 MG TABS tablet  2. Which pharmacy/location (including street and city if local pharmacy) is medication to be sent to? [facility] [address]   3. Do they need a 30 day or 90 day supply? 90 day

## 2017-01-02 NOTE — Telephone Encounter (Signed)
Pt on appropriate dose of Xarelto with CrCl > 50, refill sent in.

## 2017-01-02 NOTE — Progress Notes (Signed)
Patient reported to [facility] for Repetitive Transcranial Magnetic Stimulation treatment for severe episode of recurrent major depressive disorder, without psychotic features. Patient presented with appropriate affect, level mood and denied any suicidal or homicidal ideations. Patient denies any other current symptoms and remains optimistic with continued TMS treatment. Patient reported no change in alcohol/substane use, caffeine consumption, sleep pattern or metal implant status since previous treatment treatment. Power remained at 120% for the duration of treatment after initial increase with five impulse series. Patient reported no complaints, pain, discomfort or problems with treatment throughout session and completed session with no noted concerns.Patient departed post-treatment with no reported concerns 

## 2017-01-03 ENCOUNTER — Other Ambulatory Visit (INDEPENDENT_AMBULATORY_CARE_PROVIDER_SITE_OTHER): Payer: 59 | Admitting: Emergency Medicine

## 2017-01-03 ENCOUNTER — Other Ambulatory Visit (HOSPITAL_COMMUNITY): Payer: 59

## 2017-01-03 DIAGNOSIS — F332 Major depressive disorder, recurrent severe without psychotic features: Secondary | ICD-10-CM

## 2017-01-03 NOTE — Progress Notes (Signed)
Patient reported to  [facility] for Repetitive Transcranial Magnetic Stimulation treatment for severe episode of recurrent major depressive disorder, without psychotic features. Patient presented with appropriate affect, level mood and denied any suicidal or homicidal ideations. Patient denies any other current symptoms and remains optimistic with continued TMS treatment. Patient reported no change in alcohol/substane use, caffeine consumption, sleep pattern or metal implant status since previous treatment treatment. Power remained at 120% for the duration of treatment after initial increase with five impulse series. Patient reported no complaints, pain, discomfort or problems with treatment throughout session and completed session with no noted concerns.Patient departed post-treatment with no reported concerns 

## 2017-01-05 ENCOUNTER — Other Ambulatory Visit: Payer: Self-pay | Admitting: Cardiology

## 2017-01-06 ENCOUNTER — Other Ambulatory Visit (INDEPENDENT_AMBULATORY_CARE_PROVIDER_SITE_OTHER): Payer: 59 | Admitting: Emergency Medicine

## 2017-01-06 ENCOUNTER — Other Ambulatory Visit (HOSPITAL_COMMUNITY): Payer: 59

## 2017-01-06 DIAGNOSIS — F332 Major depressive disorder, recurrent severe without psychotic features: Secondary | ICD-10-CM

## 2017-01-06 NOTE — Progress Notes (Signed)
Patient reported to [facility] for Repetitive Transcranial Magnetic Stimulation treatment for severe episode of recurrent major depressive disorder, without psychotic features. Patient presented with appropriate affect, level mood and denied any suicidal or homicidal ideations. Patient denies any other current symptoms and remains optimistic with continued TMS treatment. Patient reported no change in alcohol/substane use, caffeine consumption, sleep pattern or metal implant status since previous treatment treatment. Power remained at 120% for the duration of treatment after initial increase with five impulse series. Patient reported no complaints, pain, discomfort or problems with treatment throughout session and completed session with no noted concerns.Patient departed post-treatment with no reported concerns 

## 2017-01-06 NOTE — Telephone Encounter (Signed)
Pt states that she prefers to have Coumadin  sent to [facility] not [facility]. Advised that I will send to requested Pharmacy.

## 2017-01-07 ENCOUNTER — Other Ambulatory Visit (HOSPITAL_COMMUNITY): Payer: 59 | Attending: Psychiatry

## 2017-01-07 ENCOUNTER — Other Ambulatory Visit (HOSPITAL_COMMUNITY): Payer: 59

## 2017-01-07 DIAGNOSIS — F332 Major depressive disorder, recurrent severe without psychotic features: Secondary | ICD-10-CM | POA: Diagnosis not present

## 2017-01-07 NOTE — Progress Notes (Signed)
Patient ID: [name], female   DOB: Dec 10, 1955, 61 y.o.   MRN: [id number] Patient reported to [facility] for Repetitive Transcranial Magnetic Stimulation treatment for severe episode of recurrent major depressive disorder, without psychotic features. Patient presented with appropriate affect, level mood and denied any suicidal or homicidal ideations. Patient denies any other current symptoms and remains optimistic with continued TMS treatment. Patient reported no change in alcohol/substane use, caffeine consumption, sleep pattern or metal implant status since previous treatment. Patient watched television during tx. Patient stated that she feels much better since starting TMS but she has been feeling extremely tired lately. She stated that she thinks that she may have sleep apnea and will be getting retested for OSA. Power remained at 120% for the duration of treatment after initial increase with five impulse series. Patient reported no complaints, pain, discomfort or problems with treatment throughout session and completed session with no noted concerns.Patient departed post-treatment with no reported concerns.

## 2017-01-08 ENCOUNTER — Other Ambulatory Visit (HOSPITAL_COMMUNITY): Payer: 59

## 2017-01-08 ENCOUNTER — Ambulatory Visit (INDEPENDENT_AMBULATORY_CARE_PROVIDER_SITE_OTHER)
Admission: RE | Admit: 2017-01-08 | Discharge: 2017-01-08 | Disposition: A | Discharge status: Home / Self Care | Payer: Medicare Other | Source: Ambulatory Visit | Attending: Emergency Medicine | Admitting: Emergency Medicine

## 2017-01-08 ENCOUNTER — Encounter: Payer: Self-pay | Admitting: Acute Care

## 2017-01-08 ENCOUNTER — Other Ambulatory Visit (INDEPENDENT_AMBULATORY_CARE_PROVIDER_SITE_OTHER): Payer: 59

## 2017-01-08 ENCOUNTER — Ambulatory Visit (INDEPENDENT_AMBULATORY_CARE_PROVIDER_SITE_OTHER): Payer: Medicare Other | Admitting: Acute Care

## 2017-01-08 VITALS — BP 120/70 | HR 93 | Ht 64.0 in | Wt 163.2 lb

## 2017-01-08 DIAGNOSIS — F332 Major depressive disorder, recurrent severe without psychotic features: Secondary | ICD-10-CM | POA: Diagnosis not present

## 2017-01-08 DIAGNOSIS — J449 Chronic obstructive pulmonary disease, unspecified: Secondary | ICD-10-CM | POA: Diagnosis not present

## 2017-01-08 DIAGNOSIS — G4733 Obstructive sleep apnea (adult) (pediatric): Secondary | ICD-10-CM

## 2017-01-08 MED ORDER — UMECLIDINIUM BROMIDE 62.5 MCG/INH IN AEPB: 1.0000 | INHALATION_SPRAY | Freq: Every day | RESPIRATORY_TRACT | 4 refills | Status: DC

## 2017-01-08 MED ORDER — ALBUTEROL SULFATE HFA 108 (90 BASE) MCG/ACT IN AERS: 2.0000 | INHALATION_SPRAY | Freq: Four times a day (QID) | RESPIRATORY_TRACT | 3 refills | Status: DC | PRN

## 2017-01-08 NOTE — Patient Instructions (Addendum)
It is nice to meet you today. We will send in a prescription for Incruse. We will change your pharmacy to [facility]. Continue using it once daily for your COPD. Remember to rinse mouth after use. We will send in a prescription for Albuterol rescue inhaler. Use this as needed for shortness of breath or wheezing up to once every 6 hours. Continue your Flonase nasal Spray for nasal stuffiness. Try Zyxol once daily for allergies. We will schedule you for a split night sleep study.( Per patient not at [facility]) Follow up with Dr. Delton Coombes in 3 months. Please contact office for sooner follow up if symptoms do not improve or worsen or seek emergency care

## 2017-01-08 NOTE — Progress Notes (Signed)
Patient ID: [name], female   DOB: 1956-06-09, 61 y.o.   MRN: [id number] Patient reported to [facility] for Repetitive Transcranial Magnetic Stimulation treatment for severe episode of recurrent major depressive disorder, without psychotic features. Patient presented with appropriate affect, level mood and denied any suicidal or homicidal ideations. Patient denies any other current symptoms and remains optimistic with continued TMS treatment. Patient reported no change in alcohol/substane use, caffeine consumption, sleep pattern or metal implant status since previous treatment treatment. Patient completed PHQ-9, totaling 1. Patient stated that she is happy she decided to do TMS as she is seeing a big improvement. Power remained at 120% for the duration of treatment after initial increase with five impulse series. Patient reported no complaints, pain, discomfort or problems with treatment throughout session and completed session with no noted concerns.Patient departed post-treatment with no reported concerns.

## 2017-01-08 NOTE — Assessment & Plan Note (Signed)
We will send in a prescription for Incruse. We will change your pharmacy to [facility]. Incruse therapeutic trial  States it worked well for her Plan: Continue using Incruse  once daily for your COPD. Remember to rinse mouth after use. We will send in a prescription for Albuterol rescue inhaler. Use this as needed for shortness of breath or wheezing up to once every 6 hours. Continue your Flonase nasal Spray for nasal stuffiness. Try Zyxol once daily for allergies. We will schedule you for a split night sleep study.( Per patient not at [facility]) Follow up with Dr. Delton Coombes in 3 months. Please contact office for sooner follow up if symptoms do not improve or worsen or seek emergency care

## 2017-01-08 NOTE — Progress Notes (Signed)
History of Present Illness [name] is a 61 y.o. female former smoker with COPD. She is followed by Dr. Delton Coombes  HPI  61 year old woman, former smoker (pack years) with a history of atrial fibrillation on amiodarone, coronary artery disease, groundglass pulmonary nodule in the left lower lobe on CT scan from 11/2010.  Referred for COPD. Ordered spiriva before but not currently due to cost.  PFT done 09/27/16 that I have reviewed >> severe obstruction without a BD response. CXR 12/10/14 >> CM without infiltrates. She feels that her breathing is OK. She does notice dyspnea with climbing stairs.   01/08/2017 Pt. Presents for 1 month follow up: Pt returns for 1 month follow up of therapeutic Trial of Incruse as maintenance for her COPD. She states that the Incruse worked well for her. She states she has not had any problems with it. She states she does not have any breathing issues right now. She states she is having problems with staying awake during the day. She does not drive because she cannot stay awake. She would like to be retested for sleep apnea.She complains of SOB on exertion, only..She has a non-productive cough, denies fever, chest pain, othopnea of hemoptysis.  Test Results:  CBC Latest Ref Rng & Units 10/08/2016 09/13/2016 07/19/2015  WBC 4.0 - 10.5 K/uL 4.7 4.5 5.1  Hemoglobin 12.0 - 15.0 g/dL 25.6 - 38.9  Hematocrit 36.0 - 46.0 % 40.2 37.9 37.4  Platelets 150.0 - 400.0 K/uL 144.0(L) 201 184    BMP Latest Ref Rng & Units 11/15/2016 10/08/2016 09/13/2016  Glucose 65 - 99 mg/dL 99 80 76  BUN 6 - 24 mg/dL 20 16 12   Creatinine 0.57 - 1.00 mg/dL 3.73(S) 2.87(G) 8.11(X)  BUN/Creat Ratio 9 - 23 18 - 9  Sodium 134 - 144 mmol/L 142 141 142  Potassium 3.5 - 5.2 mmol/L 3.7 3.6 4.0  Chloride 96 - 106 mmol/L 105 109 104  CO2 18 - 29 mmol/L 20 26 22   Calcium 8.7 - 10.2 mg/dL 7.2(I) 8.7 2.0(B)    BNP    Component Value Date/Time   BNP 69.0 11/17/2014 2107    ProBNP    Component Value  Date/Time   PROBNP 89.0 11/08/2014 1247    PFT    Component Value Date/Time   FEV1PRE 1.46 09/27/2016 1532   FEV1POST 1.59 09/27/2016 1532   FVCPRE 2.01 09/27/2016 1532   FVCPOST 2.01 09/27/2016 1532   TLC 3.89 09/27/2016 1532   DLCOUNC 14.38 09/27/2016 1532   PREFEV1FVCRT 73 09/27/2016 1532   PSTFEV1FVCRT 79 09/27/2016 1532    Dg Chest 2 View  Result Date: 01/08/2017 CLINICAL DATA:  f/u COPD. Hx of CABG, CHF, nodules, HTN, AFIB. Ex smoker. EXAM: CHEST  2 VIEW COMPARISON:  12/10/2014 FINDINGS: The cardiac silhouette is top-normal in size. Stable changes from prior CABG surgery. No mediastinal or hilar masses. No evidence of adenopathy. The lungs are clear.  No pleural effusion.  No pneumothorax. Changes from right breast surgery are stable. Skeletal structures are demineralized but intact. IMPRESSION: No acute cardiopulmonary disease. Electronically Signed   By: Amie Portland M.D.   On: 01/08/2017 14:56     Past medical hx Past Medical History:  Diagnosis Date  . Acute right MCA stroke (HCC) 11/07/10  . Atrial fibrillation (HCC)    a. s/p TEE-DCCV 02/2104; b. Xarelto started  . CAD (coronary artery disease)    a.  cath 09/2010: LAD stent patent, S-Int/dCFX ok, S-PDA ok, L-LAD atretic;  b.  Lexiscan Myoview (02/2014):  no ischemia, EF 55%; c. 11/2014 Cath/PCI: LM nl, LAD 20pISR, LCX 80-68m, OM1 nl, RI 70p, RCA 40-31m, RPDA 95ost/95-4m (PTCA only w/ reduction to 50p/101m), 60d, L->LAD atretic, VG->RI->OM nl, VG->PDA 100p.  . Cardiomyopathy with EF 40% at TEE 02/17/14 (likely tachycardia mediated - Myoview 02/19/14 neg for ischemia with normal EF) 02/18/2014  . COPD (chronic obstructive pulmonary disease) (HCC)   . Depression   . Eczema   . GERD (gastroesophageal reflux disease)   . Headache   . HLD (hyperlipidemia)   . Homelessness 11/12/2011  . 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 Hx of CABG   . Hx  of transesophageal echocardiography (TEE) for monitoring 11/2010   TEE 11/2010: EF 60-65%, BAE, trivial atrial septal shunt;  right heart cath in 10/2010 with elevated R and L heart pressures and diuretic started  . Hypertension   . Hypothyroidism   . Persistent atrial fibrillation (HCC) 10/2014  . Pulmonary nodules    repeat CT due in 11/2011  . Sleep apnea    recent sleep study  in 04/2014 per chart review  shows no significant OSA     Social History  Substance Use Topics  . Smoking status: Former Smoker    Packs/day: 0.25    Years: 30.00    Types: Cigarettes    Quit date: 09/10/2014  . Smokeless tobacco: Never Used  . Alcohol use No    Tobacco Cessation: Former smoker quit 08/2015  Past surgical hx, Family hx, Social hx all reviewed.    Review Of Systems:  Constitutional:   No  weight loss, night sweats,  Fevers, chills, +fatigue, or  lassitude.  HEENT:   No headaches,  Difficulty swallowing,  Tooth/dental problems, or  Sore throat,                No sneezing, itching, ear ache, +nasal congestion,+ post nasal drip,   CV:  No chest pain,  Orthopnea, PND, swelling in lower extremities, anasarca, dizziness, palpitations, syncope.   GI  No heartburn, indigestion, abdominal pain, nausea, vomiting, diarrhea, change in bowel habits, loss of appetite, bloody stools.   Resp: + shortness of breath with exertion not at rest.  No excess mucus, no productive cough,  + non-productive cough,  No coughing up of blood.  No change in color of mucus.  No wheezing.  No chest wall deformity  Skin: no rash or lesions.  GU: no dysuria, change in color of urine, no urgency or frequency.  No flank pain, no hematuria   MS:  No joint pain or swelling.  No decreased range of motion.  No back pain.  Psych:  No change in mood or affect. No depression or anxiety.  No memory loss.   Vital Signs BP 120/70 (BP Location: Left Arm, Cuff Size: Normal)   Pulse 93   Ht 5\' 4"  (1.626 m)   Wt 163 lb 3.2 oz  (74 kg)   SpO2 93%   BMI 28.01 kg/m    Physical Exam:  General- No distress,  A&Ox3, pleasant ENT: No sinus tenderness, TM clear, pale nasal mucosa, no oral exudate,+ post nasal drip, no LAN Cardiac: S1, S2, regular rate and rhythm, no murmur Chest: No wheeze/ rales/ dullness; no accessory muscle use, no nasal flaring, no sternal retractions Abd.: Soft Non-tender, obese Ext: No clubbing cyanosis, edema Neuro:  normal strength Skin: No rashes, warm and dry Psych: Flat affect, monotone vocalization   Assessment/Plan  COPD (chronic obstructive pulmonary disease) We will send in a prescription for Incruse. We will change your pharmacy to [facility]. Incruse therapeutic trial  States it worked well for her Plan: Continue using Incruse  once daily for your COPD. Remember to rinse mouth after use. We will send in a prescription for Albuterol rescue inhaler. Use this as needed for shortness of breath or wheezing up to once every 6 hours. Continue your Flonase nasal Spray for nasal stuffiness. Try Zyxol once daily for allergies. We will schedule you for a split night sleep study.( Per patient not at [facility]) Follow up with Dr. Delton Coombes in 3 months. Please contact office for sooner follow up if symptoms do not improve or worsen or seek emergency care      Bevelyn Ngo, NP 01/08/2017  3:36 PM

## 2017-01-09 ENCOUNTER — Other Ambulatory Visit (INDEPENDENT_AMBULATORY_CARE_PROVIDER_SITE_OTHER): Payer: 59

## 2017-01-09 ENCOUNTER — Other Ambulatory Visit (HOSPITAL_COMMUNITY): Payer: 59

## 2017-01-09 DIAGNOSIS — F332 Major depressive disorder, recurrent severe without psychotic features: Secondary | ICD-10-CM | POA: Diagnosis not present

## 2017-01-09 NOTE — Progress Notes (Signed)
Patient ID: [name], female   DOB: 04/07/1956, 61 y.o.   MRN: [id number] Patient reported to [facility] for Repetitive Transcranial Magnetic Stimulation treatment for severe episode of recurrent major depressive disorder, without psychotic features. Patient presented with appropriate affect, level mood and denied any suicidal or homicidal ideations. Patient denies any other current symptoms and remains optimistic with continued TMS treatment. Patient reported no change in alcohol/substane use, caffeine consumption, sleep pattern or metal implant status since previous treatment treatment. Patient watched television during tx. Power remained at 120% for the duration of treatment after initial increase with five impulse series. Patient reported no complaints, pain, discomfort or problems with treatment throughout session and completed session with no noted concerns.Patient departed post-treatment with no reported concerns. 

## 2017-01-10 ENCOUNTER — Other Ambulatory Visit (HOSPITAL_COMMUNITY): Payer: 59

## 2017-01-10 ENCOUNTER — Encounter (HOSPITAL_COMMUNITY): Payer: Self-pay

## 2017-01-13 ENCOUNTER — Other Ambulatory Visit (INDEPENDENT_AMBULATORY_CARE_PROVIDER_SITE_OTHER): Payer: 59

## 2017-01-13 DIAGNOSIS — F332 Major depressive disorder, recurrent severe without psychotic features: Secondary | ICD-10-CM | POA: Diagnosis not present

## 2017-01-13 NOTE — Progress Notes (Signed)
Patient ID: [name], female   DOB: Feb 25, 1956, 61 y.o.   MRN: [id number] Patient reported to [facility] for Repetitive Transcranial Magnetic Stimulation treatment for severe episode of recurrent major depressive disorder, without psychotic features. Patient presented with appropriate affect, level mood and denied any suicidal or homicidal ideations. Patient denies any other current symptoms and remains optimistic with continued TMS treatment. Patient reported no change in alcohol/substane use, caffeine consumption, sleep pattern or metal implant status since previous treatment treatment. Patient watched television during tx. Power remained at 120% for the duration of treatment after initial increase with five impulse series. Patient reported no complaints, pain, discomfort or problems with treatment throughout session and completed session with no noted concerns.Patient departed post-treatment with no reported concerns.

## 2017-01-14 ENCOUNTER — Other Ambulatory Visit (INDEPENDENT_AMBULATORY_CARE_PROVIDER_SITE_OTHER): Payer: 59 | Admitting: Emergency Medicine

## 2017-01-14 DIAGNOSIS — F332 Major depressive disorder, recurrent severe without psychotic features: Secondary | ICD-10-CM | POA: Diagnosis not present

## 2017-01-14 NOTE — Progress Notes (Signed)
Patient reported to [facility] for Repetitive Transcranial Magnetic Stimulation treatment for severe episode of recurrent major depressive disorder, without psychotic features. Patient presented with appropriate affect, level mood and denied any suicidal or homicidal ideations. Patient denies any other current symptoms and remains optimistic with continued TMS treatment. Patient reported no change in alcohol/substane use, caffeine consumption, sleep pattern or metal implant status since previous treatment treatment. Patient watched television during tx. Power remained at 120% for the duration of treatment after initial increase with five impulse series. Patient reported no complaints, pain, discomfort or problems with treatment throughout session and completed session with no noted concerns.Patient departed post-treatment with no reported concerns 

## 2017-01-15 ENCOUNTER — Other Ambulatory Visit (INDEPENDENT_AMBULATORY_CARE_PROVIDER_SITE_OTHER): Payer: 59 | Admitting: Emergency Medicine

## 2017-01-15 DIAGNOSIS — F332 Major depressive disorder, recurrent severe without psychotic features: Secondary | ICD-10-CM

## 2017-01-15 NOTE — Progress Notes (Signed)
Patient reported to [facility] for Repetitive Transcranial Magnetic Stimulation treatment for severe episode of recurrent major depressive disorder, without psychotic features. Patient presented with appropriate affect, level mood and denied any suicidal or homicidal ideations. Patient denies any other current symptoms and remains optimistic with continued TMS treatment. Patient reported no change in alcohol/substane use, caffeine consumption, sleep pattern or metal implant status since previous treatment treatment. Patient watched television during tx. Power remained at 120% for the duration of treatment after initial increase with five impulse series. Patient reported no complaints, pain, discomfort or problems with treatment throughout session and completed session with no noted concerns.Patient departed post-treatment with no reported concerns 

## 2017-01-16 ENCOUNTER — Other Ambulatory Visit (INDEPENDENT_AMBULATORY_CARE_PROVIDER_SITE_OTHER): Payer: 59 | Admitting: Emergency Medicine

## 2017-01-16 DIAGNOSIS — F332 Major depressive disorder, recurrent severe without psychotic features: Secondary | ICD-10-CM

## 2017-01-16 NOTE — Progress Notes (Addendum)
Patient reported to [facility] for Repetitive Transcranial Magnetic Stimulation treatment for severe episode of recurrent major depressive disorder, without psychotic features. Patient presented with appropriate affect, level mood and denied any suicidal or homicidal ideations. Patient denies any other current symptoms and remains optimistic with continued TMS treatment. Patient reported no change in alcohol/substane use, caffeine consumption, or metal implant status since previous treatment treatment. However, patient expressed her sleeping pattern is exteremly off. She states that she does not sleep well, waking up every hour. Pt stated that she will be doing a sleep study in July. Patient watched television during tx. Pt completed PHQ-9 scoring 2. Pt is doing well and sees positive results from the treatment sessions. Power remained at 120% for the duration of treatment after initial increase with five impulse series. Patient reported no complaints, pain, discomfort or problems with treatment throughout session and completed session with no noted concerns.Patient departed post-treatment with no reported concerns.

## 2017-01-17 ENCOUNTER — Other Ambulatory Visit (INDEPENDENT_AMBULATORY_CARE_PROVIDER_SITE_OTHER): Payer: 59 | Admitting: Emergency Medicine

## 2017-01-17 ENCOUNTER — Other Ambulatory Visit: Payer: Self-pay | Admitting: Nurse Practitioner

## 2017-01-17 DIAGNOSIS — F332 Major depressive disorder, recurrent severe without psychotic features: Secondary | ICD-10-CM | POA: Diagnosis not present

## 2017-01-17 DIAGNOSIS — G44209 Tension-type headache, unspecified, not intractable: Secondary | ICD-10-CM

## 2017-01-17 NOTE — Progress Notes (Signed)
Patient reported to [facility] for Repetitive Transcranial Magnetic Stimulation treatment for severe episode of recurrent major depressive disorder, without psychotic features. Patient presented with appropriate affect, level mood and denied any suicidal or homicidal ideations. Patient denies any other current symptoms and remains optimistic with continued TMS treatment. Patient reported no change in alcohol/substane use, caffeine consumption, sleep pattern or metal implant status since previous treatment treatment. Patient watched television during tx. Power remained at 120% for the duration of treatment after initial increase with five impulse series. Patient reported no complaints, pain, discomfort or problems with treatment throughout session and completed session with no noted concerns.Patient departed post-treatment with no reported concerns 

## 2017-01-20 ENCOUNTER — Other Ambulatory Visit (INDEPENDENT_AMBULATORY_CARE_PROVIDER_SITE_OTHER): Payer: 59 | Admitting: Emergency Medicine

## 2017-01-20 DIAGNOSIS — F332 Major depressive disorder, recurrent severe without psychotic features: Secondary | ICD-10-CM

## 2017-01-20 NOTE — Progress Notes (Signed)
Patient reported to [facility] for Repetitive Transcranial Magnetic Stimulation treatment for severe episode of recurrent major depressive disorder, without psychotic features. Patient presented with appropriate affect, level mood and denied any suicidal or homicidal ideations. Patient denies any other current symptoms and remains optimistic with continued TMS treatment. Patient reported no change in alcohol/substane use, caffeine consumption, sleep pattern or metal implant status since previous treatment treatment. Patient watched television during tx. Power remained at 120% for the duration of treatment after initial increase with five impulse series. Patient reported no complaints, pain, discomfort or problems with treatment throughout session and completed session with no noted concerns.Patient departed post-treatment with no reported concerns 

## 2017-01-21 ENCOUNTER — Other Ambulatory Visit (INDEPENDENT_AMBULATORY_CARE_PROVIDER_SITE_OTHER): Payer: 59 | Admitting: Emergency Medicine

## 2017-01-21 DIAGNOSIS — F332 Major depressive disorder, recurrent severe without psychotic features: Secondary | ICD-10-CM | POA: Diagnosis not present

## 2017-01-21 NOTE — Progress Notes (Signed)
Patient reported to [facility] for Repetitive Transcranial Magnetic Stimulation treatment for severe episode of recurrent major depressive disorder, without psychotic features. Patient presented with appropriate affect, level mood and denied any suicidal or homicidal ideations. Patient denies any other current symptoms and remains optimistic with continued TMS treatment. Patient reported no change in alcohol/substane use, caffeine consumption, sleep pattern or metal implant status since previous treatment treatment. Patient watched television during tx. Pt mentioned that she is going to change psychiatrists and try to make modifications to her medications because she is feeling better. Pt stated, "TMS is my miracle." Pt was advised to carefully make changes to medications after treatments are done, so she will not relapse.   Power remained at 120% for the duration of treatment after initial increase with five impulse series. Patient reported no complaints, pain, discomfort or problems with treatment throughout session and completed session with no noted concerns.Patient departed post-treatment with no reported concerns.

## 2017-01-22 ENCOUNTER — Other Ambulatory Visit (INDEPENDENT_AMBULATORY_CARE_PROVIDER_SITE_OTHER): Payer: 59 | Admitting: Emergency Medicine

## 2017-01-22 DIAGNOSIS — F332 Major depressive disorder, recurrent severe without psychotic features: Secondary | ICD-10-CM | POA: Diagnosis not present

## 2017-01-22 NOTE — Progress Notes (Addendum)
Patient reported to [facility] for Repetitive Transcranial Magnetic Stimulation treatment for severe episode of recurrent major depressive disorder, without psychotic features. Patient presented with appropriate affect, level mood and denied any suicidal or homicidal ideations. Patient denies any other current symptoms and remains optimistic with continued TMS treatment. Patient reported no change in alcohol/substane use, caffeine consumption, sleep pattern or metal implant status since previous treatment treatment. Patient watched television during tx. 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 Pt is meeting with her friend to make adjustment to the book. Pt is excited about this new journey. Pt completed PHQ-9 questionnaire and scored a 1. Power remained at 120% for the duration of treatment after initial increase with five impulse series. Patient reported no complaints, pain, discomfort or problems with treatment throughout session and completed session with no noted concerns.Patient departed post-treatment with no reported concerns

## 2017-01-23 ENCOUNTER — Other Ambulatory Visit (INDEPENDENT_AMBULATORY_CARE_PROVIDER_SITE_OTHER): Payer: 59 | Admitting: Emergency Medicine

## 2017-01-23 DIAGNOSIS — F332 Major depressive disorder, recurrent severe without psychotic features: Secondary | ICD-10-CM

## 2017-01-23 NOTE — Progress Notes (Signed)
Patient reported to [facility] for Repetitive Transcranial Magnetic Stimulation treatment for severe episode of recurrent major depressive disorder, without psychotic features. Patient presented with appropriate affect, level mood and denied any suicidal or homicidal ideations. Patient denies any other current symptoms and remains optimistic with continued TMS treatment. Patient reported no change in alcohol/substane use, caffeine consumption, sleep pattern or metal implant status since previous treatment treatment. Patient watched television during tx. Power remained at 120% for the duration of treatment after initial increase with five impulse series. Patient reported no complaints, pain, discomfort or problems with treatment throughout session and completed session with no noted concerns.Patient departed post-treatment with no reported concerns

## 2017-01-24 ENCOUNTER — Ambulatory Visit (HOSPITAL_COMMUNITY): Payer: Self-pay | Admitting: Psychiatry

## 2017-01-24 ENCOUNTER — Encounter (HOSPITAL_COMMUNITY): Payer: Self-pay

## 2017-01-27 ENCOUNTER — Encounter (HOSPITAL_COMMUNITY): Payer: Self-pay | Admitting: Emergency Medicine

## 2017-01-28 ENCOUNTER — Other Ambulatory Visit (INDEPENDENT_AMBULATORY_CARE_PROVIDER_SITE_OTHER): Payer: 59 | Admitting: Emergency Medicine

## 2017-01-28 DIAGNOSIS — F332 Major depressive disorder, recurrent severe without psychotic features: Secondary | ICD-10-CM | POA: Diagnosis not present

## 2017-01-28 NOTE — Progress Notes (Signed)
Patient reported to [facility] for Repetitive Transcranial Magnetic Stimulation treatment for severe episode of recurrent major depressive disorder, without psychotic features. Patient presented with appropriate affect, level mood and denied any suicidal or homicidal ideations. Patient denies any other current symptoms and remains optimistic with continued TMS treatment. Patient reported no change in alcohol/substane use, caffeine consumption, sleep pattern or metal implant status since previous treatment treatment. Pt missed treatment sessions Friday and Monday because she had a stressful couple of days.Power remained at 120% for the duration of treatment after initial increase with five impulse series. Patient reported no complaints, pain, discomfort or problems with treatment throughout session and completed session with no noted concerns.Patient departed post-treatment with no reported concerns 

## 2017-01-29 ENCOUNTER — Other Ambulatory Visit (INDEPENDENT_AMBULATORY_CARE_PROVIDER_SITE_OTHER): Payer: 59 | Admitting: Emergency Medicine

## 2017-01-29 DIAGNOSIS — F332 Major depressive disorder, recurrent severe without psychotic features: Secondary | ICD-10-CM

## 2017-01-29 NOTE — Progress Notes (Signed)
Patient reported to [facility] for Repetitive Transcranial Magnetic Stimulation treatment for severe episode of recurrent major depressive disorder, without psychotic features. Patient presented with appropriate affect, level mood and denied any suicidal or homicidal ideations. Patient denies any other current symptoms and remains optimistic with continued TMS treatment. Patient reported no change in alcohol/substane use, caffeine consumption, sleep pattern or metal implant status since previous treatment treatment. Power remained at 120% for the duration of treatment after initial increase with five impulse series. Patient reported no complaints, pain, discomfort or problems with treatment throughout session and completed session with no noted concerns.Patient departed post-treatment with no reported concerns 

## 2017-01-30 ENCOUNTER — Telehealth: Payer: Self-pay

## 2017-01-30 ENCOUNTER — Other Ambulatory Visit (INDEPENDENT_AMBULATORY_CARE_PROVIDER_SITE_OTHER): Payer: 59 | Admitting: Emergency Medicine

## 2017-01-30 DIAGNOSIS — F332 Major depressive disorder, recurrent severe without psychotic features: Secondary | ICD-10-CM | POA: Diagnosis not present

## 2017-01-30 MED ORDER — RIVAROXABAN 20 MG PO TABS: ORAL_TABLET | ORAL | 1 refills | Status: DC

## 2017-01-30 NOTE — Telephone Encounter (Signed)
Receiving incoming call to Triage. Pt stated she has not taken Xarelto for 1 week and would like samples. Informed I will forward to Dr. Lindaann Slough nurse. Informed someone will call pt back.

## 2017-01-30 NOTE — Telephone Encounter (Signed)
Pt calling to inform Dr Delton See and myself that she has gone a full week without taking her Xarelto (due to having none on hand).  Pt states that she would like samples of Xarelto to be placed at the front desk for her to pick up, until she can complete her patient assistance paperwork for this med, and return this back to our prior auth Nurse.  Pt states she went a week without this, for she has transportation issues.  Pt states she would like for refills of this med to be sent to [facility] from here on out.  Spoke with Dr Delton See about the pt missing a weeks worth of her Xarelto, and she advised that we provide her samples, until paperwork is complete.  Dr Delton See highly advised the pt to expedite in completing her pt assistance paperwork for this med ASAP.  Pt states she will be completing this, hopefully by tomorrow, for she states that when she will go down to social services.  Pt education provided on risk factors associated with missing doses of this med.  Informed the pt that I will place samples at the front desk for her to come an pick up.  Informed the pt that I will route this message to our prior auth Nurse for further review and follow-up, on pts assistance form for this med.  Pt verbalized understanding and agrees with this plan.  3 weeks of Xarelto 20 mg left at the front desk for the pt to pick up.  Prior auth RN is aware of pts dilemma with getting her pt assistance forms completed and back to our office to submit. Prior Authorization Nurse to continue to follow-up with the pt.

## 2017-01-30 NOTE — Progress Notes (Addendum)
Patient reported to [facility] for Repetitive Transcranial Magnetic Stimulation treatment for severe episode of recurrent major depressive disorder, without psychotic features. Patient presented with appropriate affect, level mood and denied any suicidal or homicidal ideations. Patient denies any other current symptoms and remains optimistic with continued TMS treatment. Patient reported no change in alcohol/substane use, caffeine consumption, sleep pattern or metal implant status since previous treatment treatment. Pt missed treatment sessions Friday and Monday because she had a stressful couple of days.Power remained at 120% for the duration of treatment after initial increase with five impulse series. Patient reported no complaints, pain, discomfort or problems with treatment throughout session and completed session with no noted concerns.Patient departed post-treatment with no reported concerns 

## 2017-01-31 ENCOUNTER — Other Ambulatory Visit (INDEPENDENT_AMBULATORY_CARE_PROVIDER_SITE_OTHER): Payer: 59

## 2017-01-31 DIAGNOSIS — F332 Major depressive disorder, recurrent severe without psychotic features: Secondary | ICD-10-CM

## 2017-01-31 NOTE — Progress Notes (Signed)
Patient ID: [name], female   DOB: 17-Jan-1956, 61 y.o.   MRN: [id number] Patient reported to [facility] for Repetitive Transcranial Magnetic Stimulation treatment for severe episode of recurrent major depressive disorder, without psychotic features. Patient presented with appropriate affect, level mood and denied any suicidal or homicidal ideations. Patient denies any other current symptoms and remains optimistic with continued TMS treatment. Patient reported no change in alcohol/substane use, caffeine consumption, sleep pattern or metal implant status since previous treatment treatment.Power remained at 120% for the duration of treatment after initial increase with five impulse series. Patient reported no complaints, pain, discomfort or problems with treatment throughout session and completed session with no noted concerns.Patient departed post-treatment with no reported concerns

## 2017-01-31 NOTE — Telephone Encounter (Signed)
The pt returned her portion of the pt assistance form for Xarelto. I have faxed the provider portion along with her information to [facility] pt assistance program.  Awaiting response.

## 2017-02-04 ENCOUNTER — Other Ambulatory Visit (INDEPENDENT_AMBULATORY_CARE_PROVIDER_SITE_OTHER): Payer: 59 | Admitting: Emergency Medicine

## 2017-02-04 DIAGNOSIS — F332 Major depressive disorder, recurrent severe without psychotic features: Secondary | ICD-10-CM

## 2017-02-04 NOTE — Progress Notes (Signed)
Patient reported to [facility] for Repetitive Transcranial Magnetic Stimulation treatment for severe episode of recurrent major depressive disorder, without psychotic features. Patient presented with appropriate affect, level mood and denied any suicidal or homicidal ideations. Patient denies any other current symptoms and remains optimistic with continued TMS treatment. Patient reported no change in alcohol/substane use, caffeine consumption, sleep pattern or metal implant status since previous treatment treatment. Power remained at 120% for the duration of treatment after initial increase with five impulse series. Patient reported no complaints, pain, discomfort or problems with treatment throughout session and completed session with no noted concerns.Patient departed post-treatment with no reported concerns

## 2017-02-05 ENCOUNTER — Other Ambulatory Visit (INDEPENDENT_AMBULATORY_CARE_PROVIDER_SITE_OTHER): Payer: 59 | Admitting: Emergency Medicine

## 2017-02-05 DIAGNOSIS — F332 Major depressive disorder, recurrent severe without psychotic features: Secondary | ICD-10-CM | POA: Diagnosis not present

## 2017-02-05 NOTE — Progress Notes (Signed)
Patient reported to [facility] for Repetitive Transcranial Magnetic Stimulation treatment for severe episode of recurrent major depressive disorder, without psychotic features. Patient presented with appropriate affect, level mood and denied any suicidal or homicidal ideations. Patient denies any other current symptoms and remains optimistic with continued TMS treatment. Patient reported no change in alcohol/substane use, caffeine consumption, sleep pattern or metal implant status since previous treatment treatment. Pt completed PHQ-9 scoring a 1. Power remained at 120% for the duration of treatment after initial increase with five impulse series. Patient reported no complaints, pain, discomfort or problems with treatment throughout session and completed session with no noted concerns.Patient departed post-treatment with no reported concerns

## 2017-02-06 ENCOUNTER — Encounter (HOSPITAL_COMMUNITY): Payer: Self-pay

## 2017-02-07 NOTE — Addendum Note (Signed)
Addendum  created 02/07/17 0925 by Robbi Spells, MD   Sign clinical note    

## 2017-02-10 NOTE — Telephone Encounter (Signed)
We received a fax from [employer] stating that they need the pts insurance information before a decision can be made on granting pt assistance to the pt for Xarelto.  The faxed letter from [employer] states that they have notified the pt of the missing required insurance information and I will attempt to contact the pt after 8 am this morning as well.

## 2017-02-10 NOTE — Telephone Encounter (Signed)
I s/w the pt to let her know that I am faxing in scanned copies of her insurance cards that we have on file to [facility] [employer]. She verbalized understanding.

## 2017-02-11 ENCOUNTER — Other Ambulatory Visit (HOSPITAL_COMMUNITY): Payer: 59 | Attending: Psychiatry | Admitting: Emergency Medicine

## 2017-02-11 DIAGNOSIS — F332 Major depressive disorder, recurrent severe without psychotic features: Secondary | ICD-10-CM

## 2017-02-11 NOTE — Progress Notes (Signed)
Patient reported to [facility] for Repetitive Transcranial Magnetic Stimulation treatment for severe episode of recurrent major depressive disorder, without psychotic features. Patient presented with appropriate affect, level mood and denied any suicidal or homicidal ideations. Patient denies any other current symptoms and remains optimistic with continued TMS treatment. Patient reported no change in alcohol/substane use, caffeine consumption, sleep pattern or metal implant status since previous treatment treatment. Pt missed treatment sessions Friday and Monday because she had a stressful couple of days.Power remained at 120% for the duration of treatment after initial increase with five impulse series. Patient reported no complaints, pain, discomfort or problems with treatment throughout session and completed session with no noted concerns.Patient departed post-treatment with no reported concerns 

## 2017-02-13 ENCOUNTER — Other Ambulatory Visit (INDEPENDENT_AMBULATORY_CARE_PROVIDER_SITE_OTHER): Payer: 59 | Admitting: Emergency Medicine

## 2017-02-13 DIAGNOSIS — F332 Major depressive disorder, recurrent severe without psychotic features: Secondary | ICD-10-CM

## 2017-02-13 NOTE — Progress Notes (Signed)
Patient reported to [facility] for Repetitive Transcranial Magnetic Stimulation treatment for severe episode of recurrent major depressive disorder, without psychotic features. Patient presented with appropriate affect, level mood and denied any suicidal or homicidal ideations. Patient denies any other current symptoms and remains optimistic with continued TMS treatment. Patient reported no change in alcohol/substane use, caffeine consumption, sleep pattern or metal implant status since previous treatment treatment. Pt missed treatment sessions Friday and Monday because she had a stressful couple of days.Power remained at 120% for the duration of treatment after initial increase with five impulse series. Patient reported no complaints, pain, discomfort or problems with treatment throughout session and completed session with no noted concerns.Patient departed post-treatment with no reported concerns

## 2017-02-15 ENCOUNTER — Encounter: Payer: Self-pay | Admitting: Family Medicine

## 2017-02-15 ENCOUNTER — Ambulatory Visit (INDEPENDENT_AMBULATORY_CARE_PROVIDER_SITE_OTHER): Payer: Medicare Other | Admitting: Family Medicine

## 2017-02-15 VITALS — BP 144/92 | HR 54 | Temp 98.0°F | Wt 167.1 lb

## 2017-02-15 DIAGNOSIS — M26609 Unspecified temporomandibular joint disorder, unspecified side: Secondary | ICD-10-CM

## 2017-02-15 DIAGNOSIS — M26659 Arthropathy of unspecified temporomandibular joint: Secondary | ICD-10-CM

## 2017-02-15 MED ORDER — METHYLPREDNISOLONE 4 MG PO TBPK: ORAL_TABLET | ORAL | 0 refills | Status: DC

## 2017-02-15 NOTE — Progress Notes (Signed)
Pre visit review using our clinic review tool, if applicable. No additional management support is needed unless otherwise documented below in the visit note. 

## 2017-02-15 NOTE — Progress Notes (Signed)
   Subjective:    Patient ID: [name], female    DOB: 1955-11-22, 61 y.o.   MRN: [id number]  HPI Here for 3 days of pain in the left ear and the left side of the face. Chewing is painful.   Review of Systems  Constitutional: Negative.   HENT: Positive for ear pain. Negative for congestion, postnasal drip, sinus pain, sinus pressure, sneezing and sore throat.   Eyes: Negative.   Respiratory: Negative.   Neurological: Negative for headaches.       Objective:   Physical Exam  Constitutional: She appears well-developed and well-nourished.  HENT:  Right Ear: External ear normal.  Left Ear: External ear normal.  Nose: Nose normal.  Mouth/Throat: Oropharynx is clear and moist.  The left TMJ is tender and has a lot of crepitus   Neck: Neck supple. No thyromegaly present.  Pulmonary/Chest: Effort normal and breath sounds normal. No respiratory distress. She has no wheezes. She has no rales.  Lymphadenopathy:    She has no cervical adenopathy.          Assessment & Plan:  TMJ pain. She will rest the jaw and avoid opening her mouth wide. Given a Medrol dose pack. Advised her to see her dentist soon to check her bite alignment.  Gershon Crane, MD

## 2017-02-17 ENCOUNTER — Telehealth: Payer: Self-pay | Admitting: Cardiology

## 2017-02-17 NOTE — Telephone Encounter (Signed)
New message   Pt verbalized that she has received an application for   xarelto and that she needs samples until it is approved she want someone to go over the application with her  Patient calling the office for samples of medication:   1.  What medication and dosage are you requesting samples for?  xarelto  2.  Are you currently out of this medication? yes

## 2017-02-17 NOTE — Telephone Encounter (Signed)
2 weeks of Xarelto samples placed at front desk, will send to Prior AUTH RN to be looked into, pt aware & grateful for assistance.

## 2017-02-24 NOTE — Telephone Encounter (Addendum)
The pt has been denied pt assistance for Xarelto from [employer]. According to the denial letter the pt does not meet the eligibility requirements at this time as she has insurance coverage for this product.  The letter states that they have notified the pt of their decision.  Will forward message to Dr Delton See and her nurse as Lorain Childes.

## 2017-02-24 NOTE — Telephone Encounter (Signed)
Please let me know if her xarelto coverage gets denied

## 2017-03-07 ENCOUNTER — Encounter (HOSPITAL_BASED_OUTPATIENT_CLINIC_OR_DEPARTMENT_OTHER): Payer: Self-pay

## 2017-03-10 ENCOUNTER — Telehealth (HOSPITAL_COMMUNITY): Payer: Self-pay

## 2017-03-10 ENCOUNTER — Encounter (HOSPITAL_BASED_OUTPATIENT_CLINIC_OR_DEPARTMENT_OTHER): Payer: Self-pay

## 2017-03-10 NOTE — Telephone Encounter (Signed)
Patient is calling because she just finished TMS and is feeling great, she is out of her medications and wants to know if she should restart them since she is doing so well. Please review and advise, thank you

## 2017-03-14 NOTE — Telephone Encounter (Signed)
I called patient after speaking to Dr. Donell Beers and advised her to start back on the escitalopram 10 mg

## 2017-03-27 ENCOUNTER — Encounter: Payer: Self-pay | Admitting: Cardiology

## 2017-03-27 ENCOUNTER — Ambulatory Visit (INDEPENDENT_AMBULATORY_CARE_PROVIDER_SITE_OTHER): Payer: Medicare Other | Admitting: Cardiology

## 2017-03-27 DIAGNOSIS — G44209 Tension-type headache, unspecified, not intractable: Secondary | ICD-10-CM | POA: Diagnosis not present

## 2017-03-27 DIAGNOSIS — E032 Hypothyroidism due to medicaments and other exogenous substances: Secondary | ICD-10-CM

## 2017-03-27 DIAGNOSIS — I25810 Atherosclerosis of coronary artery bypass graft(s) without angina pectoris: Secondary | ICD-10-CM

## 2017-03-27 DIAGNOSIS — Z79899 Other long term (current) drug therapy: Secondary | ICD-10-CM | POA: Diagnosis not present

## 2017-03-27 DIAGNOSIS — I480 Paroxysmal atrial fibrillation: Secondary | ICD-10-CM

## 2017-03-27 DIAGNOSIS — F331 Major depressive disorder, recurrent, moderate: Secondary | ICD-10-CM | POA: Diagnosis not present

## 2017-03-27 MED ORDER — LEVOTHYROXINE SODIUM 125 MCG PO TABS: ORAL_TABLET | ORAL | 3 refills | Status: DC

## 2017-03-27 MED ORDER — CLOPIDOGREL BISULFATE 75 MG PO TABS: 75.0000 mg | ORAL_TABLET | Freq: Every morning | ORAL | 3 refills | Status: DC

## 2017-03-27 MED ORDER — ATORVASTATIN CALCIUM 20 MG PO TABS: 20.0000 mg | ORAL_TABLET | Freq: Every day | ORAL | 3 refills | Status: DC

## 2017-03-27 NOTE — Patient Instructions (Signed)
Medication Instructions:   Your physician recommends that you continue on your current medications as directed. Please refer to the Current Medication list given to you today.    Labwork:  TODAY--BMET AND TSH     Testing/Procedures:  Your physician has recommended that you have a pulmonary function test. Pulmonary Function Tests are a group of tests that measure how well air moves in and out of your lungs.    Follow-Up:  Your physician wants you to follow-up in: 6 MONTHS WITH DR Johnell Comings will receive a reminder letter in the mail two months in advance. If you don't receive a letter, please call our office to schedule the follow-up appointment.        If you need a refill on your cardiac medications before your next appointment, please call your pharmacy.

## 2017-03-27 NOTE — Progress Notes (Signed)
Patient ID: [name], female   DOB: 10-Jan-1956, 61 y.o.   MRN: [id number]      Cardiology Office Note   Date:  03/29/2017   ID:  [name], DOB 1955/11/20, MRN [id number]  PCP:  Madelin Headings, MD  Cardiologist:  Dr. Tobias Alexander    Chief complain: 6 months follow up for PAF and CAD   History of Present Illness: Tina VANDEVEN is a 61 y.o. female with a hx of CAD s/p CABG and subsequent stenting of the native LAD (LIMA-LAD found to be atretic), PAF (on amiodarone and Xarelto), chronic combined systolic and diastolic HF (EF 78% in 2015 >> improved to 65-70% in 3/16), HTN, HL, CKD, OSA, CVA in 2012, COPD, bradycardia.    Evaluated by Dr. Tobias Alexander 11/2014 for progressively worsening DOE.  R/L heart cath was arranged.  She was admitted 3/3-3/4. Cardiac catheterization demonstrated patent LAD stent, SVG-RI/OM patent, SVG-PDA occluded (new finding), severe distal PDA disease treated with balloon angioplasty only (This was suboptimal as balloon was not fully expanded).  Because she is on chronic xarelto therapy, she was discharged on aspirin, Plavix, and xarelto. She will need to discontinue aspirin after one month and continue Plavix and xarelto after that time.  Readmitted 4/2-12/14/2014 with atrial fibrillation with RVR. She had run out of amiodarone 4 days prior to admission. She was hypokalemic with a potassium less than 3. She was placed on IV amiodarone. Potassium was replaced. Patient returned to NSR and she was transitioned back to oral amiodarone. She was placed on spironolactone to help with achieving normal potassium levels. Hospitalization was complicated by hypotension as well as worsening renal insufficiency felt to be related to hypoperfusion from AF with RVR.  Troponins were elevated. As the patient had incomplete dilation of the PDA during previous angioplasty, this was not felt to be a surprise in the setting of atrial fibrillation with RVR. This was felt to be demand  ischemia. Medical therapy was continued. Aspirin was stopped and she was >30 days post PCI.    05/31/15 - 5 months follow up, she feels significantly better, she has no CP and denies SOB, also her dizziness and orthostatic hypotension has improved. She is able to walk with a cane. She was admitted in July with hypothyroidism, hypokalemia and hypomagnesemia. Synthroid was increased then. On 05/16/2015 TSH was 0.5, but she was off synthroid for a while.  Recently she was not able to use her kitchen and developed acute renal insufficiency. Now eating/drinking well. Denies orthopnea, PND. No bleeding. No muscle pain.  09/13/2016 - the patient is coming after 15 months, she has been compliant with her medicines and denies any side effects. She denies any bleeding and has been compliant with Xarelto. She denies any chest pain or dyspnea on exertion as well as lower extremity edema however has noticed worsening fatigue. She denies any palpitations or syncope.  03/27/17 - 6 months follow up, she saw Dr Johney Frame in January 2018, not interested in a-fib ablation or PM placement for chronotropic incompetence. She states that she feels ok, denies palpitations, no dizziness or syncope. She is compliant with her meds and has no side effects. She denies chest pain, DOE, se appears SOB at rest, but she states that she feels well and doesn't feel SOB.  Studies/Reports Reviewed Today:  LHC/PCI 11/10/14 LM: ok LAD: stent in the proximal segment that is roughly 20% in-stent restenosis. RI: 70% proximal disease.  LCx:  80-90% stenosis in the  mid AV groove segment RCA: prox 40%, mid 40-50%, RPDA ostial 95% then 95-99% then 60% L-LAD:  Atretic (old) S-RI/OM:  Patent  S-RPDA:  prox 100% PCI:  Suboptimal PTCA only of the ostial RPDA and mid RPDA leading to the anastomosis site. Unable to fully expand the balloon.  Echo 11/08/14 - Mild focal basal hypertrophy of the septum. LVF vigorous. EF 65% to 70%.  HK basalinferior  myocardium. Grade 1 diastolic dysfunction. - Aortic valve: There was trivial regurgitation. - Left atrium: The atrium was mildly dilated. Impressions: Compared to the prior study, there has been no significant interval change.  Myoview 02/19/14 IMPRESSION: No evidence for reversibility or myocardial ischemia.  Calculated ejection fraction is 55%.  Carotid US 5/15 No sig ICA stenosis   Past Medical History:  Diagnosis Date  . Acute right MCA stroke (HCC) 11/07/10  . Atrial fibrillation (HCC)    a. s/p TEE-DCCV 02/2104; b. Xarelto started  . CAD (coronary artery disease)    a.  cath 09/2010: LAD stent patent, S-Int/dCFX ok, S-PDA ok, L-LAD atretic;  b. Lexiscan Myoview (02/2014):  no ischemia, EF 55%; c. 11/2014 Cath/PCI: LM nl, LAD 20pISR, LCX 80-41m, OM1 nl, RI 70p, RCA 40-29m, RPDA 95ost/95-39m (PTCA only w/ reduction to 50p/41m), 60d, L->LAD atretic, VG->RI->OM nl, VG->PDA 100p.  . Cardiomyopathy with EF 40% at TEE 02/17/14 (likely tachycardia mediated - Myoview 02/19/14 neg for ischemia with normal EF) 02/18/2014  . COPD (chronic obstructive pulmonary disease) (HCC)   . Depression   . Eczema   . GERD (gastroesophageal reflux disease)   . Headache   . HLD (hyperlipidemia)   . Homelessness 11/12/2011  . 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 Hx of CABG   . Hx of transesophageal echocardiography (TEE) for monitoring 11/2010   TEE 11/2010: EF 60-65%, BAE, trivial atrial septal shunt;  right heart cath in 10/2010 with elevated R and L heart pressures and diuretic started  . Hypertension   . Hypothyroidism   . Persistent atrial fibrillation (HCC) 10/2014  . Pulmonary nodules    repeat CT due in 11/2011  . Sleep apnea    recent sleep study  in 04/2014 per chart review  shows no significant OSA    Past Surgical History:  Procedure Laterality Date  . bilateral knee surgery    . BLADDER SURGERY    .  CARDIOVERSION N/A 02/17/2014   Procedure: CARDIOVERSION;  Surgeon: Pricilla Riffle, MD;  Location: [facility] ENDOSCOPY;  Service: Cardiovascular;  Laterality: N/A;  . CARDIOVERSION N/A 09/20/2016   Procedure: CARDIOVERSION;  Surgeon: Lars Masson, MD;  Location: [facility] ENDOSCOPY;  Service: Cardiovascular;  Laterality: N/A;  . cath 2012    . CHEST WALL RECONSTRUCTION    . CORONARY ARTERY BYPASS GRAFT    . debriment for infection in chest    . HERNIA REPAIR    . LEFT AND RIGHT HEART CATHETERIZATION WITH CORONARY ANGIOGRAM N/A 11/10/2014   Procedure: LEFT AND RIGHT HEART CATHETERIZATION WITH CORONARY ANGIOGRAM;  Surgeon: Marykay Lex, MD;  Location: [facility] CATH LAB;  Service: Cardiovascular;  Laterality: N/A;  . Left mastoidectomy    . TEE WITHOUT CARDIOVERSION N/A 02/17/2014   Procedure: TRANSESOPHAGEAL ECHOCARDIOGRAM (TEE);  Surgeon: Pricilla Riffle, MD;  Location: [facility] ENDOSCOPY;  Service: Cardiovascular;  Laterality: N/A;     Current Outpatient Prescriptions  Medication Sig Dispense Refill  .  albuterol (PROVENTIL HFA;VENTOLIN HFA) 108 (90 Base) MCG/ACT inhaler Inhale 2 puffs into the lungs every 6 (six) hours as needed for wheezing or shortness of breath. 1 Inhaler 3  . amiodarone (PACERONE) 200 MG tablet TAKE 1 TABLET BY MOUTH  DAILY 90 tablet 3  . atorvastatin (LIPITOR) 20 MG tablet Take 1 tablet (20 mg total) by mouth daily. 90 tablet 3  . buPROPion (WELLBUTRIN XL) 300 MG 24 hr tablet Take 1 tablet (300 mg total) by mouth every morning. 30 tablet 5  . clopidogrel (PLAVIX) 75 MG tablet Take 1 tablet (75 mg total) by mouth every morning. 90 tablet 3  . divalproex (DEPAKOTE ER) 500 MG 24 hr tablet TAKE 2 TABLETS BY MOUTH  DAILY (Patient taking differently: TAKE 1 TABLET BY MOUTH  DAILY) 180 tablet 1  . escitalopram (LEXAPRO) 10 MG tablet Take 1 tablet (10 mg total) by mouth daily. 30 tablet 4  . fluticasone (FLONASE) 50 MCG/ACT nasal spray Place 2 sprays into both nostrils daily. 48 g 1  . magnesium oxide  (MAG-OX) 400 MG tablet Take 1 tablet (400 mg total) by mouth daily. 180 tablet 1  . methylPREDNISolone (MEDROL DOSEPAK) 4 MG TBPK tablet As directed 21 tablet 0  . nitroGLYCERIN (NITROSTAT) 0.4 MG SL tablet Place 1 tablet (0.4 mg total) under the tongue every 5 (five) minutes as needed for chest pain. 90 tablet 3  . ondansetron (ZOFRAN-ODT) 4 MG disintegrating tablet DISSOLVE ONE TABLET IN MOUTH EVERY 8 HOURS AS NEEDED FOR NAUSEA 10 tablet 0  . potassium chloride SA (K-DUR,KLOR-CON) 20 MEQ tablet TAKE 1 TABLET BY MOUTH 3  TIMES DAILY 270 tablet 3  . rivaroxaban (XARELTO) 20 MG TABS tablet TAKE ONE TABLET BY MOUTH ONCE DAILY WITH  SUPPER 90 tablet 1  . Tiotropium Bromide Monohydrate (SPIRIVA RESPIMAT) 2.5 MCG/ACT AERS Inhale 1 puff into the lungs daily. 1 Inhaler 1  . topiramate (TOPAMAX) 50 MG tablet Take 1 tablet (50 mg total) by mouth 2 (two) times daily. 180 tablet 5  . umeclidinium bromide (INCRUSE ELLIPTA) 62.5 MCG/INH AEPB Inhale 1 puff into the lungs daily. 28 each 4  . levothyroxine (SYNTHROID) 100 MCG tablet Take 1 tablet (100 mcg total) by mouth daily before breakfast. 30 tablet 11   No current facility-administered medications for this visit.     Allergies:   Avelox [moxifloxacin hcl in nacl]; Pamelor [nortriptyline hcl]; Amoxicillin; Hydrocodone; Oxycodone; Penicillins; and Sulfa antibiotics    Social History:  The patient  reports that she quit smoking about 2 years ago. Her smoking use included Cigarettes. She has a 7.50 pack-year smoking history. She has never used smokeless tobacco. She reports that she does not drink alcohol or use drugs.   Family History:  The patient's family history includes Alcohol abuse in her father; Anxiety disorder in her sister; Arthritis in her mother; COPD in her mother; Depression in her father, mother, and sister; Diabetes type II in her mother; Drug abuse in her sister; Heart attack in her father; Heart disease in her mother; Hypertension in her  father; Osteoporosis in her mother.    ROS:   Please see the history of present illness.   Review of Systems  Hematologic/Lymphatic: Bruises/bleeds easily.  Gastrointestinal: Positive for constipation.  Genitourinary: Positive for urgency.  Psychiatric/Behavioral: Positive for depression.  All other systems reviewed and are negative.    PHYSICAL EXAM: VS:  BP 126/80   Pulse 78   Ht 5\' 4"  (1.626 m)   Wt 170 lb (  77.1 kg)   BMI 29.18 kg/m     No data found.    Wt Readings from Last 3 Encounters:  03/27/17 170 lb (77.1 kg)  02/15/17 167 lb 1.9 oz (75.8 kg)  01/08/17 163 lb 3.2 oz (74 kg)     GEN: Well nourished, well developed, in no acute distress  HEENT: normal  Neck: no JVD,  no masses Cardiac:  Normal S1/S2, RRR; no murmur ,  no rubs or gallops, no edema  R groin without hematoma or bruit   Respiratory:  clear to auscultation bilaterally, no wheezing, rhonchi or rales. GI: soft, nontender, nondistended, + BS MS: no deformity or atrophy  Skin: warm and dry  Neuro:  CNs II-XII intact, Strength and sensation are intact Psych: Normal affect   EKG:  EKG is ordered today.  It demonstrates:   NSR, HR 78, normal axis, inferolateral T-wave inversions, no significant change compared to prior tracing QTc 421   Recent Labs: 09/13/2016: ALT 7 10/08/2016: Hemoglobin 13.6; Platelets 144.0 11/15/2016: Magnesium 1.2 03/27/2017: BUN 22; Creatinine, Ser 1.19; Potassium 4.1; Sodium 144; TSH 0.133    Lipid Panel    Component Value Date/Time   CHOL 188 10/08/2016 0803   TRIG 83.0 10/08/2016 0803   HDL 65.60 10/08/2016 0803   CHOLHDL 3 10/08/2016 0803   VLDL 16.6 10/08/2016 0803   LDLCALC 106 (H) 10/08/2016 0803   LDLDIRECT 153.0 01/15/2010 1501   TTE 11/2014 Left ventricle: The cavity size was normal. There was mild focal basal hypertrophy of the septum. Systolic function was vigorous. The estimated ejection fraction was in the range of 65% to 70%. There is hypokinesis of  the basalinferior myocardium. Doppler parameters are consistent with abnormal left ventricular relaxation (grade 1 diastolic dysfunction). - Aortic valve: There was trivial regurgitation. - Left atrium: The atrium was mildly dilated.  Impressions:  - Compared to the prior study, there has been no significant interval change.    ASSESSMENT AND PLAN:  1. PAF - in SR today, not interested in a-fib ablation, we will check TSH, PFTs. Continue amiodarone, xarelto, no bleeding.  2. Coronary artery disease involving autologous vein coronary bypass graft without angina pectoris I -  Continue Plavix in addition to Xarelto, statin.  ASA was stopped 30 days post PCI.  - unable to add betablocker as she is bradycardic  3. Orthostatic hypotension - resolved after holding diuretics  4. Essential hypertension Controlled.  5. Hyperlipidemia Continue atorvastatin 20 mg po daily. LFTs normal.  6. Hypothyrodism - recheck TSH today.   Check BMP, TSH, PFTs.  Follow up in 6 months.  Tobias Alexander, MD 03/29/2017

## 2017-03-28 ENCOUNTER — Telehealth: Payer: Self-pay | Admitting: *Deleted

## 2017-03-28 DIAGNOSIS — E039 Hypothyroidism, unspecified: Secondary | ICD-10-CM

## 2017-03-28 LAB — BASIC METABOLIC PANEL
BUN/Creatinine Ratio: 18 (ref 9–23)
BUN: 22 mg/dL (ref 6–24)
CO2: 19 mmol/L — ABNORMAL LOW (ref 20–29)
Calcium: 8.6 mg/dL — ABNORMAL LOW (ref 8.7–10.2)
Chloride: 109 mmol/L — ABNORMAL HIGH (ref 96–106)
Creatinine, Ser: 1.19 mg/dL — ABNORMAL HIGH (ref 0.57–1.00)
GFR calc Af Amer: 58 mL/min/{1.73_m2} — ABNORMAL LOW (ref >59)
GFR calc non Af Amer: 50 mL/min/{1.73_m2} — ABNORMAL LOW (ref >59)
Glucose: 74 mg/dL (ref 65–99)
Potassium: 4.1 mmol/L (ref 3.5–5.2)
Sodium: 144 mmol/L (ref 134–144)

## 2017-03-28 LAB — TSH: TSH: 0.133 u[IU]/mL — ABNORMAL LOW (ref 0.450–4.500)

## 2017-03-28 MED ORDER — LEVOTHYROXINE SODIUM 100 MCG PO TABS: 100.0000 ug | ORAL_TABLET | Freq: Every day | ORAL | 11 refills | Status: DC

## 2017-03-28 NOTE — Telephone Encounter (Signed)
Pt notified of lab results and findings today. Pt advised per Dr. Delton See to decrease Synthroid to 100 mcg daily, with repeat TSH in 4-6 weeks. Pt is agreeable to plan of care. New Rx Synthroid 100 mcg daily sent to [facility], TSH scheduled for 05/06/17. Pt thanked me for my call.

## 2017-03-28 NOTE — Telephone Encounter (Signed)
-----   Message from Lars Masson, MD sent at 03/28/2017  4:37 PM EDT ----- Low TSH, I would decrease synthroid to 100 mcg and recheck TSH in 4-6 weeks.

## 2017-04-02 ENCOUNTER — Ambulatory Visit (INDEPENDENT_AMBULATORY_CARE_PROVIDER_SITE_OTHER): Payer: Medicare Other | Admitting: Internal Medicine

## 2017-04-02 ENCOUNTER — Encounter: Payer: Self-pay | Admitting: Internal Medicine

## 2017-04-02 DIAGNOSIS — I480 Paroxysmal atrial fibrillation: Secondary | ICD-10-CM | POA: Diagnosis not present

## 2017-04-02 DIAGNOSIS — Z79899 Other long term (current) drug therapy: Secondary | ICD-10-CM

## 2017-04-02 LAB — PULMONARY FUNCTION TEST
DL/VA % pred: 96 %
DL/VA: 4.65 ml/min/mmHg/L
DLCO unc % pred: 60 %
DLCO unc: 14.64 ml/min/mmHg
FEF 25-75 Post: 1.41 L/sec
FEF 25-75 Pre: 0.82 L/sec
FEF2575-%Change-Post: 72 %
FEF2575-%Pred-Post: 59 %
FEF2575-%Pred-Pre: 34 %
FEV1-%Change-Post: 15 %
FEV1-%Pred-Post: 51 %
FEV1-%Pred-Pre: 44 %
FEV1-Post: 1.33 L
FEV1-Pre: 1.15 L
FEV1FVC-%Change-Post: 3 %
FEV1FVC-%Pred-Pre: 93 %
FEV6-%Change-Post: 11 %
FEV6-%Pred-Post: 54 %
FEV6-%Pred-Pre: 49 %
FEV6-Post: 1.75 L
FEV6-Pre: 1.58 L
FEV6FVC-%Pred-Post: 104 %
FEV6FVC-%Pred-Pre: 104 %
FVC-%Change-Post: 11 %
FVC-%Pred-Post: 52 %
FVC-%Pred-Pre: 47 %
FVC-Post: 1.75 L
FVC-Pre: 1.58 L
Post FEV1/FVC ratio: 76 %
Post FEV6/FVC ratio: 100 %
Pre FEV1/FVC ratio: 73 %
Pre FEV6/FVC Ratio: 100 %

## 2017-04-02 NOTE — Progress Notes (Signed)
PFT done today. 

## 2017-04-08 ENCOUNTER — Telehealth: Payer: Self-pay | Admitting: *Deleted

## 2017-04-08 NOTE — Telephone Encounter (Signed)
Went over the pts PFT results and recommendations per Dr Delton See.  Advised the pt to stop taking amiodarone, due to abnormal PFTs.  Informed the pt that per Dr Delton See, is she becomes symptomatic while being off of amiodarone, she should then call our office and inform us of this.  Provided pt education on symptoms to watch out for, if she becomes symptomatic with afib.  Pt verbalized understanding and agrees with this plan.

## 2017-04-08 NOTE — Telephone Encounter (Signed)
-----   Message from Lars Masson, MD sent at 04/08/2017 12:21 PM EDT ----- Thank you! Kieth Hartis, please stop amiodarone and have her call if she is symptomatic. KN  ----- Message ----- From: Hillis Range, MD Sent: 04/08/2017  11:01 AM To: Lars Masson, MD  My note from January seemed to suggest that she was not very symptomatic with afib. I would stop amiodarone now and let it washout.  If she has afib without symptoms, rate control.  If she develops symptoms then we could consider sotalol or tikosyn down the road.   ----- Message ----- From: Lars Masson, MD Sent: 04/07/2017   5:17 PM To: Hillis Range, MD, Loa Socks, LPN  Lendell Caprice, This patient is on chronic amiodarone for PAF, at the last visit she appeared SOB just talking. I ordered PFTs that showed: Severe Obstructive Airways Disease Response to bronchodilator Severe Restriction -Parenchymal Moderate Diffusion Defect Would you advise to switch amiodarone to a different regimen? She has CAD. Thank you, Aris Lot

## 2017-04-15 NOTE — Progress Notes (Signed)
Chief Complaint  Patient presents with  . Abdominal Pain  . Emesis    Diarrhea    HPI: [name] 61 y.o. come   w new problem  She has multiple medical problesm  Under went tms for depression and added back lexapro  In July and doing very well  Feels normal  After a long time of depression.     Eating normal for  her but in the pst 2-3 weeks has had  Left upper quadrant up under her left rib cage pain that comes and goes and she notices it is worse on an empty stomach in may get better if she eats a little bit. She's had some intermittent nausea and vomiting about 3 times a week. Of note she has had nausea and vomiting in the past for multiple reasons. She will take her Zofran. There is no hematemesis blood in her stool and has had a negative stool test for blood. She is on Plavix and anticoagulation. No new specific medicines in fact less since she has had the TMS. Her thyroid doses have been adjusted recently. She has had an occasional intermittent hiccups it is new with this pain but not intractable No new shortness of breath or cough or fever. She does take magnesium once a day and her 3 potassium pills   That she ? Cruses up.   She has gainded weight cause has been eating better .  ROS: See pertinent positives and negatives per HPI.  Past Medical History:  Diagnosis Date  . Acute right MCA stroke (HCC) 11/07/10  . Atrial fibrillation (HCC)    a. s/p TEE-DCCV 02/2104; b. Xarelto started  . CAD (coronary artery disease)    a.  cath 09/2010: LAD stent patent, S-Int/dCFX ok, S-PDA ok, L-LAD atretic;  b. Lexiscan Myoview (02/2014):  no ischemia, EF 55%; c. 11/2014 Cath/PCI: LM nl, LAD 20pISR, LCX 80-27m, OM1 nl, RI 70p, RCA 40-90m, RPDA 95ost/95-38m (PTCA only w/ reduction to 50p/103m), 60d, L->LAD atretic, VG->RI->OM nl, VG->PDA 100p.  . Cardiomyopathy with EF 40% at TEE 02/17/14 (likely tachycardia mediated - Myoview 02/19/14 neg for ischemia with normal EF) 02/18/2014  . COPD (chronic  obstructive pulmonary disease) (HCC)   . Depression   . Eczema   . GERD (gastroesophageal reflux disease)   . Headache   . HLD (hyperlipidemia)   . Homelessness 11/12/2011  . 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 Hx of CABG   . Hx of transesophageal echocardiography (TEE) for monitoring 11/2010   TEE 11/2010: EF 60-65%, BAE, trivial atrial septal shunt;  right heart cath in 10/2010 with elevated R and L heart pressures and diuretic started  . Hypertension   . Hypothyroidism   . Persistent atrial fibrillation (HCC) 10/2014  . Pulmonary nodules    repeat CT due in 11/2011  . Sleep apnea    recent sleep study  in 04/2014 per chart review  shows no significant OSA    Family History  Problem Relation Age of Onset  . COPD Mother   . Heart disease Mother   . Arthritis Mother        Rheumatoid and PMR  . Osteoporosis Mother        Mom fractured hip  . Diabetes type II Mother   . Depression Mother   . Heart attack Father   .  Depression Father   . Hypertension Father   . Alcohol abuse Father   . Depression Sister   . Anxiety disorder Sister   . Drug abuse Sister   . Stroke Neg Hx     Social History   Social History  . Marital status: Divorced    Spouse name: N/A  . Number of children: 2  . Years of education: N/A   Occupational History  . DISABLE     Social History Main Topics  . Smoking status: Former Smoker    Packs/day: 0.25    Years: 30.00    Types: Cigarettes    Quit date: 09/10/2014  . Smokeless tobacco: Never Used  . Alcohol use No  . Drug use: No  . Sexual activity: Not Currently   Other Topics Concern  . None   Social History Narrative   Divorced   After stroke was living with sister and mother after sister asked her to leave. Mom has since deceased    Difficulties over control.    Then moved back in for about 6 weeks.   . tranportaion difficult son  helping    Ex-smoker   Was living at friends  Sister and her had argument and she was told to leave .    She was living in a homeless shelter for the last 3 weeks [facility]    son had help with transportation and medication       Work status regular  before got sick on short-term disability now lost t insurance after the stroke and couldn't work was  denied ssi    2 times.  She is not eligible for Medicaid because she doesn't have dependent children.         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   Lives in  rented house 2 house mates males keep tp self has her own b room  Doing well     Outpatient Medications Prior to Visit  Medication Sig Dispense Refill  . albuterol (PROVENTIL HFA;VENTOLIN HFA) 108 (90 Base) MCG/ACT inhaler Inhale 2 puffs into the lungs every 6 (six) hours as needed for wheezing or shortness of breath. 1 Inhaler 3  . atorvastatin (LIPITOR) 20 MG tablet Take 1 tablet (20 mg total) by mouth daily. 90 tablet 3  . clopidogrel (PLAVIX) 75 MG tablet Take 1 tablet (75 mg total) by mouth every morning. 90 tablet 3  . fluticasone (FLONASE) 50 MCG/ACT nasal spray Place 2 sprays into both nostrils daily. 48 g 1  . levothyroxine (SYNTHROID) 100 MCG tablet Take 1 tablet (100 mcg total) by mouth daily before breakfast. 30 tablet 11  . magnesium oxide (MAG-OX) 400 MG tablet Take 1 tablet (400 mg total) by mouth daily. 180 tablet 1  . nitroGLYCERIN (NITROSTAT) 0.4 MG SL tablet Place 1 tablet (0.4 mg total) under the tongue every 5 (five) minutes as needed for chest pain. 90 tablet 3  . ondansetron (ZOFRAN-ODT) 4 MG disintegrating tablet DISSOLVE ONE TABLET IN MOUTH EVERY 8 HOURS AS NEEDED FOR NAUSEA 10 tablet 0  . potassium chloride SA (K-DUR,KLOR-CON) 20 MEQ tablet TAKE 1 TABLET BY  MOUTH 3  TIMES DAILY 270 tablet 3  . rivaroxaban (XARELTO) 20 MG TABS tablet TAKE ONE TABLET BY  MOUTH ONCE DAILY WITH  SUPPER 90 tablet 1  . Tiotropium Bromide Monohydrate (SPIRIVA RESPIMAT) 2.5 MCG/ACT AERS Inhale 1 puff into the lungs daily. 1 Inhaler 1  . topiramate (TOPAMAX) 50 MG tablet Take 1 tablet (50 mg total) by mouth 2 (two) times daily. 180 tablet 5  . methylPREDNISolone (MEDROL DOSEPAK) 4 MG TBPK tablet As directed 21 tablet 0  . umeclidinium bromide (INCRUSE ELLIPTA) 62.5 MCG/INH AEPB Inhale 1 puff into the lungs daily. 28 each 4  . buPROPion (WELLBUTRIN XL) 300 MG 24 hr tablet Take 1 tablet (300 mg total) by mouth every morning. (Patient not taking: Reported on 04/16/2017) 30 tablet 5  . divalproex (DEPAKOTE ER) 500 MG 24 hr tablet TAKE 2 TABLETS BY MOUTH  DAILY (Patient not taking: Reported on 04/16/2017) 180 tablet 1  . escitalopram (LEXAPRO) 10 MG tablet Take 1 tablet (10 mg total) by mouth daily. (Patient not taking: Reported on 04/16/2017) 30 tablet 4   No facility-administered medications prior to visit.      EXAM:  BP 140/90 (BP Location: Left Arm, Patient Position: Sitting, Cuff Size: Normal)   Pulse 60   Temp 97.6 F (36.4 C) (Oral)   Wt 168 lb 6.4 oz (76.4 kg)   BMI 28.91 kg/m   Body mass index is 28.91 kg/m.  GENERAL: vitals reviewed and listed above, alert, oriented, appears well hydrated and in no acute distress animated will smile   HEENT: atraumatic, conjunctiva  clear, no obvious abnormalities on inspection of external nose and ears OP : no lesion edema or exudate  NECK: no obvious masses on inspection palpation  LUNGS: clear to auscultation bilaterally, no wheezes, rales or rhonchi,  Abdomen:  Sof,t normal bowel sounds without hepatosplenomegaly, no guarding rebound or masses no CVA tenderness points to left lateral lupper q area but no masses felt   CV: HRRR, no clubbing cyanosis or  peripheral edema nl cap refill  MS: moves all extremities without  noticeable focal  abnormality PSYCH: pleasant and cooperative,  Lab Results  Component Value Date   WBC 4.5 04/16/2017   HGB 12.9 04/16/2017   HCT 38.4 04/16/2017   PLT 179.0 04/16/2017   GLUCOSE 82 04/16/2017   CHOL 188 10/08/2016   TRIG 83.0 10/08/2016   HDL 65.60 10/08/2016   LDLDIRECT 153.0 01/15/2010   LDLCALC 106 (H) 10/08/2016   ALT 5 04/16/2017   AST 8 04/16/2017   NA 141 04/16/2017   K 3.3 (L) 04/16/2017   CL 108 04/16/2017   CREATININE 1.27 (H) 04/16/2017   BUN 14 04/16/2017   CO2 30 04/16/2017   TSH 0.133 (L) 03/27/2017   INR 1.4 (H) 09/13/2016   HGBA1C  02/11/2011    5.4 (NOTE)                                                                       According to the ADA Clinical Practice Recommendations for 2011, when HbA1c is used as a screening test:   >=6.5%   Diagnostic of Diabetes Mellitus           (if abnormal result  is confirmed)  5.7-6.4%   Increased risk of developing Diabetes Mellitus  References:Diagnosis and Classification of Diabetes Mellitus,Diabetes Care,2011,34(Suppl 1):S62-S69 and Standards  of Medical Care in         Diabetes - 2011,Diabetes Care,2011,34  (Suppl 1):S11-S61.   BP Readings from Last 3 Encounters:  04/16/17 140/90  03/27/17 126/80  02/15/17 (!) 144/92   Wt Readings from Last 3 Encounters:  04/16/17 168 lb 6.4 oz (76.4 kg)  03/27/17 170 lb (77.1 kg)  02/15/17 167 lb 1.9 oz (75.8 kg)    ASSESSMENT AND PLAN:  Discussed the following assessment and plan:  LUQ pain - Plan: POCT Urinalysis Dipstick (Automated), CBC with Differential/Platelet, Comprehensive metabolic panel, Magnesium, DG Abd Acute W/Chest  Alternating constipation and diarrhea - Plan: POCT Urinalysis Dipstick (Automated), CBC with Differential/Platelet, Comprehensive metabolic panel, Magnesium, DG Abd Acute W/Chest  Nausea and vomiting in adult - Plan: POCT Urinalysis Dipstick (Automated), CBC with Differential/Platelet, Comprehensive metabolic panel, Magnesium, DG  Abd Acute W/Chest  Medically complex patient Her bowel habits have not been that much different with alternating constipation diarrhea but the left upper quadrant or side pain is new. And she does state it's more likely to occur on an empty stomach when she gets up in the morning. No other alarm symptoms consideration of ulcer disease ,   no other alarm symptoms. At this point recheck CBC and urinalysis abdominal series to look at the left upper quadrant diaphragm area and add an H2 blocker.  And close follow-up low threshold to refer to GI for more evaluation. Consideration of abdominal CT etc.  She is pleased as am I that the TM S has helped her a good bit. Has been able to decrease her pill count. -Patient advised to return or notify health care team  if  new concerns arise.  Patient Instructions   Urinalysis and lab work today. Get plain chest abdominal x-ray I'm aware that your chest x-ray in May was normal. This could be something like an ulcer. We may need to get gastroenterology involved. For now and ranitidine 150 mg twice a day and then plan follow-up in 3 weeks or such If you are worse contact the health care team in the interim.     Neta Mends. Panosh M.D.

## 2017-04-16 ENCOUNTER — Encounter: Payer: Self-pay | Admitting: Internal Medicine

## 2017-04-16 ENCOUNTER — Ambulatory Visit (INDEPENDENT_AMBULATORY_CARE_PROVIDER_SITE_OTHER): Payer: Medicare Other | Admitting: Internal Medicine

## 2017-04-16 VITALS — BP 140/90 | HR 60 | Temp 97.6°F | Wt 168.4 lb

## 2017-04-16 DIAGNOSIS — R112 Nausea with vomiting, unspecified: Secondary | ICD-10-CM | POA: Diagnosis not present

## 2017-04-16 DIAGNOSIS — Z789 Other specified health status: Secondary | ICD-10-CM | POA: Diagnosis not present

## 2017-04-16 DIAGNOSIS — R1012 Left upper quadrant pain: Secondary | ICD-10-CM

## 2017-04-16 DIAGNOSIS — R198 Other specified symptoms and signs involving the digestive system and abdomen: Secondary | ICD-10-CM | POA: Diagnosis not present

## 2017-04-16 LAB — COMPREHENSIVE METABOLIC PANEL
ALT: 5 U/L (ref 0–35)
AST: 8 U/L (ref 0–37)
Albumin: 3.8 g/dL (ref 3.5–5.2)
Alkaline Phosphatase: 62 U/L (ref 39–117)
BILIRUBIN TOTAL: 0.6 mg/dL (ref 0.2–1.2)
BUN: 14 mg/dL (ref 6–23)
CALCIUM: 8.4 mg/dL (ref 8.4–10.5)
CHLORIDE: 108 meq/L (ref 96–112)
CO2: 30 meq/L (ref 19–32)
Creatinine, Ser: 1.27 mg/dL — ABNORMAL HIGH (ref 0.40–1.20)
GFR: 45.69 mL/min — AB (ref >60.00)
Glucose, Bld: 82 mg/dL (ref 70–99)
Potassium: 3.3 mEq/L — ABNORMAL LOW (ref 3.5–5.1)
Sodium: 141 mEq/L (ref 135–145)
Total Protein: 6.2 g/dL (ref 6.0–8.3)

## 2017-04-16 LAB — CBC WITH DIFFERENTIAL/PLATELET
BASOS PCT: 0.9 % (ref 0.0–3.0)
Basophils Absolute: 0 10*3/uL (ref 0.0–0.1)
EOS PCT: 2.5 % (ref 0.0–5.0)
Eosinophils Absolute: 0.1 10*3/uL (ref 0.0–0.7)
HEMATOCRIT: 38.4 % (ref 36.0–46.0)
Hemoglobin: 12.9 g/dL (ref 12.0–15.0)
LYMPHS PCT: 25.7 % (ref 12.0–46.0)
Lymphs Abs: 1.2 10*3/uL (ref 0.7–4.0)
MCHC: 33.6 g/dL (ref 30.0–36.0)
MCV: 96.3 fl (ref 78.0–100.0)
MONOS PCT: 7.9 % (ref 3.0–12.0)
Monocytes Absolute: 0.4 10*3/uL (ref 0.1–1.0)
Neutro Abs: 2.9 10*3/uL (ref 1.4–7.7)
Neutrophils Relative %: 63 % (ref 43.0–77.0)
Platelets: 179 10*3/uL (ref 150.0–400.0)
RBC: 3.98 Mil/uL (ref 3.87–5.11)
RDW: 13.3 % (ref 11.5–15.5)
WBC: 4.5 10*3/uL (ref 4.0–10.5)

## 2017-04-16 LAB — MAGNESIUM: Magnesium: 1.5 mg/dL (ref 1.5–2.5)

## 2017-04-16 MED ORDER — RANITIDINE HCL 150 MG PO TABS: 150.0000 mg | ORAL_TABLET | Freq: Two times a day (BID) | ORAL | 1 refills | Status: DC

## 2017-04-16 NOTE — Patient Instructions (Signed)
  Urinalysis and lab work today. Get plain chest abdominal x-ray I'm aware that your chest x-ray in May was normal. This could be something like an ulcer. We may need to get gastroenterology involved. For now and ranitidine 150 mg twice a day and then plan follow-up in 3 weeks or such If you are worse contact the health care team in the interim.

## 2017-05-01 ENCOUNTER — Encounter (HOSPITAL_COMMUNITY): Payer: Self-pay | Admitting: Psychiatry

## 2017-05-01 ENCOUNTER — Ambulatory Visit (INDEPENDENT_AMBULATORY_CARE_PROVIDER_SITE_OTHER): Payer: 59 | Admitting: Psychiatry

## 2017-05-01 DIAGNOSIS — F411 Generalized anxiety disorder: Secondary | ICD-10-CM

## 2017-05-01 DIAGNOSIS — Z813 Family history of other psychoactive substance abuse and dependence: Secondary | ICD-10-CM

## 2017-05-01 DIAGNOSIS — F17210 Nicotine dependence, cigarettes, uncomplicated: Secondary | ICD-10-CM

## 2017-05-01 DIAGNOSIS — F331 Major depressive disorder, recurrent, moderate: Secondary | ICD-10-CM

## 2017-05-01 DIAGNOSIS — Z818 Family history of other mental and behavioral disorders: Secondary | ICD-10-CM

## 2017-05-01 DIAGNOSIS — Z811 Family history of alcohol abuse and dependence: Secondary | ICD-10-CM

## 2017-05-01 DIAGNOSIS — Z79899 Other long term (current) drug therapy: Secondary | ICD-10-CM | POA: Diagnosis not present

## 2017-05-01 MED ORDER — BUPROPION HCL ER (XL) 300 MG PO TB24: 300.0000 mg | ORAL_TABLET | Freq: Every morning | ORAL | 1 refills | Status: DC

## 2017-05-01 MED ORDER — ESCITALOPRAM OXALATE 10 MG PO TABS: 10.0000 mg | ORAL_TABLET | Freq: Every day | ORAL | 1 refills | Status: DC

## 2017-05-01 NOTE — Progress Notes (Signed)
BH MD/PA/NP OP Progress Note  05/01/2017 2:32 PM [name]  MRN:  [id number]  Chief Complaint:  Chief Complaint    Follow-up     HPI: [name] presents for TMS follow-up. She reports that in terms of her depression, she feels like she has been doing well, but the stressors from her personal and family life of started to compound. She has had more anxiety and poor frustration tolerance related to her son, specifically related to his poor financial decisions which are affecting her both financially and personally. 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 He is living in a extended hotel, because he was kicked out of his apartment due to not paying his bills. He is driving her car, despite the fact that the patient was hoping to sell her car because she no longer drives, and she is financially quite stressed. She denies any safety issues, reports that she needs to restart her Wellbutrin and Lexapro which she had run out of. I agreed to send her 90 days and she will be following up with Dr. Donell Beers as scheduled. Spent time with the patient discussing limit setting, and expectation setting with her son, and how to gradually go about changing this dynamic by setting and dates for when she will be able to babysit for the dogs. She was receptive to this, and re-engaging in individual therapy.  Visit Diagnosis:    ICD-10-CM   1. Major depressive disorder, recurrent episode, moderate (HCC) F33.1 buPROPion (WELLBUTRIN XL) 300 MG 24 hr tablet    escitalopram (LEXAPRO) 10 MG tablet    Past Psychiatric History: See intake H&P for full details. Reviewed, with no updates at this time.   Past Medical History:  Past Medical History:  Diagnosis Date  . Acute right MCA stroke (HCC) 11/07/10  . Atrial fibrillation (HCC)    a. s/p TEE-DCCV 02/2104; b. Xarelto started  . CAD (coronary artery disease)    a.  cath 09/2010:  LAD stent patent, S-Int/dCFX ok, S-PDA ok, L-LAD atretic;  b. Lexiscan Myoview (02/2014):  no ischemia, EF 55%; c. 11/2014 Cath/PCI: LM nl, LAD 20pISR, LCX 80-62m, OM1 nl, RI 70p, RCA 40-4m, RPDA 95ost/95-55m (PTCA only w/ reduction to 50p/70m), 60d, L->LAD atretic, VG->RI->OM nl, VG->PDA 100p.  . Cardiomyopathy with EF 40% at TEE 02/17/14 (likely tachycardia mediated - Myoview 02/19/14 neg for ischemia with normal EF) 02/18/2014  . COPD (chronic obstructive pulmonary disease) (HCC)   . Depression   . Eczema   . GERD (gastroesophageal reflux disease)   . Headache   . HLD (hyperlipidemia)   . Homelessness 11/12/2011  . 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 Hx of CABG   . Hx of transesophageal echocardiography (TEE) for monitoring 11/2010   TEE 11/2010: EF 60-65%, BAE, trivial atrial septal shunt;  right heart cath in 10/2010 with elevated R and L heart pressures and diuretic started  . Hypertension   . Hypothyroidism   . Persistent atrial fibrillation (HCC) 10/2014  . Pulmonary nodules    repeat CT due in 11/2011  . Sleep apnea    recent sleep study  in 04/2014 per chart review  shows no significant OSA    Past Surgical History:  Procedure Laterality Date  . bilateral knee surgery    .  BLADDER SURGERY    . CARDIOVERSION N/A 02/17/2014   Procedure: CARDIOVERSION;  Surgeon: Pricilla Riffle, MD;  Location: [facility] ENDOSCOPY;  Service: Cardiovascular;  Laterality: N/A;  . CARDIOVERSION N/A 09/20/2016   Procedure: CARDIOVERSION;  Surgeon: Lars Masson, MD;  Location: [facility] ENDOSCOPY;  Service: Cardiovascular;  Laterality: N/A;  . cath 2012    . CHEST WALL RECONSTRUCTION    . CORONARY ARTERY BYPASS GRAFT    . debriment for infection in chest    . HERNIA REPAIR    . LEFT AND RIGHT HEART CATHETERIZATION WITH CORONARY ANGIOGRAM N/A 11/10/2014   Procedure: LEFT AND RIGHT HEART CATHETERIZATION WITH CORONARY ANGIOGRAM;   Surgeon: Marykay Lex, MD;  Location: [facility] CATH LAB;  Service: Cardiovascular;  Laterality: N/A;  . Left mastoidectomy    . TEE WITHOUT CARDIOVERSION N/A 02/17/2014   Procedure: TRANSESOPHAGEAL ECHOCARDIOGRAM (TEE);  Surgeon: Pricilla Riffle, MD;  Location: [facility] ENDOSCOPY;  Service: Cardiovascular;  Laterality: N/A;    Family Psychiatric History: See intake H&P for full details. Reviewed, with no updates at this time.   Family History:  Family History  Problem Relation Age of Onset  . COPD Mother   . Heart disease Mother   . Arthritis Mother        Rheumatoid and PMR  . Osteoporosis Mother        Mom fractured hip  . Diabetes type II Mother   . Depression Mother   . Heart attack Father   . Depression Father   . Hypertension Father   . Alcohol abuse Father   . Depression Sister   . Anxiety disorder Sister   . Drug abuse Sister   . Stroke Neg Hx     Social History:  Social History   Social History  . Marital status: Divorced    Spouse name: N/A  . Number of children: 2  . Years of education: N/A   Occupational History  . DISABLE     Social History Main Topics  . Smoking status: Current Every Day Smoker    Packs/day: 0.50    Years: 30.00    Types: Cigarettes    Last attempt to quit: 09/10/2014  . Smokeless tobacco: Never Used  . Alcohol use No  . Drug use: No  . Sexual activity: Not Currently   Other Topics Concern  . None   Social History Narrative   Divorced   After stroke was living with sister and mother after sister asked her to leave. Mom has since deceased    Difficulties over control.    Then moved back in for about 6 weeks.   . tranportaion difficult son  helping   Ex-smoker   Was living at friends  Sister and her had argument and she was told to leave .    She was living in a homeless shelter for the last 3 weeks [facility]    son had help with transportation and medication       Work status regular  before got sick on short-term disability now  lost t insurance after the stroke and couldn't work was  denied ssi    2 times.  She is not eligible for Medicaid because she doesn't have dependent children.         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   Lives in  rented house 2 house mates males keep tp self has her own b room  Doing well     Allergies:  Allergies  Allergen Reactions  . Avelox [Moxifloxacin Hcl In Nacl] Other (See Comments)    Mental breakdown.  [name] [Nortriptyline Hcl] Other (See Comments)    Made her want to hurt herself  . Amoxicillin Hives  . Hydrocodone Itching  . Oxycodone Itching  . Penicillins Hives    Has patient had a PCN reaction causing immediate rash, facial/tongue/throat swelling, SOB or lightheadedness with hypotension: Yes Has patient had a PCN reaction causing severe rash involving mucus membranes or skin necrosis: Yes Has patient had a PCN reaction that required hospitalization No Has patient had a PCN reaction occurring within the last 10 years: No If all of the above answers are "NO", then may proceed with Cephalosporin use.   . Sulfa Antibiotics Other (See Comments)    unknown    Metabolic Disorder Labs: Lab Results  Component Value Date   HGBA1C  02/11/2011    5.4 (NOTE)                                                                       According to the ADA Clinical Practice Recommendations for 2011, when HbA1c is used as a screening test:   >=6.5%   Diagnostic of Diabetes Mellitus           (if abnormal result  is confirmed)  5.7-6.4%   Increased risk of developing Diabetes Mellitus  References:Diagnosis and Classification of Diabetes Mellitus,Diabetes Care,2011,34(Suppl 1):S62-S69 and Standards of Medical Care in         Diabetes - 2011,Diabetes Care,2011,34   (Suppl 1):S11-S61.   MPG 108 02/11/2011   MPG 111 11/08/2010   Lab Results  Component Value Date   PROLACTIN 7.4 07/02/2011   Lab Results  Component Value Date   CHOL 188 10/08/2016   TRIG 83.0 10/08/2016   HDL 65.60 10/08/2016   CHOLHDL 3 10/08/2016   VLDL 16.6 10/08/2016   LDLCALC 106 (H) 10/08/2016   LDLCALC 143 (H) 03/14/2015   Lab Results  Component Value Date   TSH 0.133 (L) 03/27/2017   TSH 0.514 11/15/2016    Therapeutic Level Labs: No results found for: LITHIUM No results found for: VALPROATE No components found for:  CBMZ  Current Medications: Current Outpatient Prescriptions  Medication Sig Dispense Refill  . albuterol (PROVENTIL HFA;VENTOLIN HFA) 108 (90 Base) MCG/ACT inhaler Inhale 2 puffs into the lungs every 6 (six) hours as needed for wheezing or shortness of breath. 1 Inhaler 3  . atorvastatin (LIPITOR) 20 MG tablet Take 1 tablet (20 mg total) by mouth daily. 90 tablet 3  . clopidogrel (PLAVIX) 75 MG tablet Take 1 tablet (75 mg total) by mouth every morning. 90 tablet 3  . fluticasone (FLONASE) 50 MCG/ACT nasal spray Place 2 sprays into both nostrils daily. 48 g 1  . levothyroxine (SYNTHROID) 100 MCG tablet Take 1 tablet (100 mcg total) by mouth daily before breakfast. 30 tablet 11  . magnesium oxide (MAG-OX) 400 MG tablet Take 1 tablet (400 mg total) by mouth daily. 180  tablet 1  . nitroGLYCERIN (NITROSTAT) 0.4 MG SL tablet Place 1 tablet (0.4 mg total) under the tongue every 5 (five) minutes as needed for chest pain. 90 tablet 3  . ondansetron (ZOFRAN-ODT) 4 MG disintegrating tablet DISSOLVE ONE TABLET IN MOUTH EVERY 8 HOURS AS NEEDED FOR NAUSEA 10 tablet 0  . potassium chloride SA (K-DUR,KLOR-CON) 20 MEQ tablet TAKE 1 TABLET BY MOUTH 3  TIMES DAILY 270 tablet 3  . ranitidine (ZANTAC) 150 MG tablet Take 1 tablet (150 mg total) by mouth 2 (two) times daily. 60 tablet 1  . rivaroxaban (XARELTO) 20 MG TABS tablet TAKE ONE TABLET BY MOUTH ONCE DAILY WITH   SUPPER 90 tablet 1  . topiramate (TOPAMAX) 50 MG tablet Take 1 tablet (50 mg total) by mouth 2 (two) times daily. 180 tablet 5  . buPROPion (WELLBUTRIN XL) 300 MG 24 hr tablet Take 1 tablet (300 mg total) by mouth every morning. 90 tablet 1  . escitalopram (LEXAPRO) 10 MG tablet Take 1 tablet (10 mg total) by mouth daily. 90 tablet 1  . Tiotropium Bromide Monohydrate (SPIRIVA RESPIMAT) 2.5 MCG/ACT AERS Inhale 1 puff into the lungs daily. (Patient not taking: Reported on 05/01/2017) 1 Inhaler 1   No current facility-administered medications for this visit.      Musculoskeletal: Strength & Muscle Tone: within normal limits Gait & Station: normal Patient leans: Left  Psychiatric Specialty Exam: ROS  Blood pressure 112/78, pulse (!) 58, height 5' 2.5" (1.588 m), weight 165 lb (74.8 kg).Body mass index is 29.7 kg/m.  General Appearance: Casual and Fairly Groomed  Eye Contact:  Good  Speech:  Normal Rate and Slow  Volume:  Normal  Mood:  Dysphoric  Affect:  Appropriate and Congruent  Thought Process:  Goal Directed  Orientation:  Full (Time, Place, and Person)  Thought Content: Logical   Suicidal Thoughts:  No  Homicidal Thoughts:  No  Memory:  Immediate;   Fair  Judgement:  Fair  Insight:  Fair  Psychomotor Activity:  Normal  Concentration:  Attention Span: Fair  Recall:  [employer] of Knowledge: Fair  Language: Good  Akathisia:  Negative  Handed:  Right  AIMS (if indicated): not done  Assets:  Communication Skills Desire for Improvement Housing  ADL's:  Intact  Cognition: WNL  Sleep:  Fair    Assessment and Plan: [name] is a 61 year old female with a history of major depressive disorder, generalized anxiety, and multiple medical problems including status post right MCA stroke. She recently completed TMS with our offices at behavioral health, and presents today for follow-up. She continues to struggle with anxiety and mood symptoms largely related to financial and  family issues. I spent time with the patient encouraging boundaries and limit setting, and spent time troubleshooting with her how she can go about doing this with her son. She is agreeable to reengage in individual therapy and will follow up with her primary psychiatrist next month. No indication to restart TMS at this time given that much of her anxiety is particularly related to the stressors due to family issues, and she was agreeable with this. She can follow-up with this writer in 6 months to reconsider TMS if she would desire.  1. Major depressive disorder, recurrent episode, moderate (HCC)    Continue Lexapro and Wellbutrin Follow-up with her primary psychiatrist and restart individual therapy Follow-up as needed with this writer for reconsideration of TMS in the future  Burnard Leigh, MD 05/01/2017, 2:32 PM

## 2017-05-06 ENCOUNTER — Ambulatory Visit: Payer: Self-pay | Admitting: Emergency Medicine

## 2017-05-06 ENCOUNTER — Other Ambulatory Visit: Payer: Self-pay

## 2017-05-07 ENCOUNTER — Ambulatory Visit: Payer: Self-pay | Admitting: Internal Medicine

## 2017-05-07 ENCOUNTER — Other Ambulatory Visit: Payer: Medicare Other | Admitting: *Deleted

## 2017-05-07 DIAGNOSIS — E039 Hypothyroidism, unspecified: Secondary | ICD-10-CM | POA: Diagnosis not present

## 2017-05-07 LAB — TSH: TSH: 2.88 u[IU]/mL (ref 0.450–4.500)

## 2017-05-09 ENCOUNTER — Telehealth: Payer: Self-pay | Admitting: Cardiology

## 2017-05-09 NOTE — Telephone Encounter (Signed)
Called pt and left message for pt to call back to get lab results. °

## 2017-05-16 ENCOUNTER — Other Ambulatory Visit: Payer: Self-pay | Admitting: Neurology

## 2017-05-21 ENCOUNTER — Ambulatory Visit (INDEPENDENT_AMBULATORY_CARE_PROVIDER_SITE_OTHER): Payer: 59 | Admitting: Psychiatry

## 2017-05-21 VITALS — BP 132/78 | HR 51 | Ht 64.0 in | Wt 166.6 lb

## 2017-05-21 DIAGNOSIS — F17210 Nicotine dependence, cigarettes, uncomplicated: Secondary | ICD-10-CM | POA: Diagnosis not present

## 2017-05-21 DIAGNOSIS — Z811 Family history of alcohol abuse and dependence: Secondary | ICD-10-CM

## 2017-05-21 DIAGNOSIS — F331 Major depressive disorder, recurrent, moderate: Secondary | ICD-10-CM | POA: Diagnosis not present

## 2017-05-21 DIAGNOSIS — Z818 Family history of other mental and behavioral disorders: Secondary | ICD-10-CM | POA: Diagnosis not present

## 2017-05-21 DIAGNOSIS — F325 Major depressive disorder, single episode, in full remission: Secondary | ICD-10-CM

## 2017-05-21 DIAGNOSIS — G43909 Migraine, unspecified, not intractable, without status migrainosus: Secondary | ICD-10-CM

## 2017-05-21 DIAGNOSIS — Z813 Family history of other psychoactive substance abuse and dependence: Secondary | ICD-10-CM | POA: Diagnosis not present

## 2017-05-21 MED ORDER — ESCITALOPRAM OXALATE 10 MG PO TABS: 10.0000 mg | ORAL_TABLET | Freq: Every day | ORAL | 1 refills | Status: DC

## 2017-05-21 MED ORDER — BUPROPION HCL ER (XL) 300 MG PO TB24: 300.0000 mg | ORAL_TABLET | Freq: Every morning | ORAL | 1 refills | Status: DC

## 2017-05-21 NOTE — Progress Notes (Signed)
BH MD/PA/NP OP Progress Note  05/21/2017 4:22 PM [name]  MRN:  [id number]  Chief Complaint:   HPI: Patient is much better. Her therapist referred her to the IOP program then she was referred to TMS. She seems to have a great response to TMS. the patient is sleeping and eating well. She's got good energy. She enjoys reading a great deal watching TV. The patient treated for migraine headaches with Topamax. The patient does not have a sleep study next weeks. Patient is a good sense of worth. She can think and concentrate much better. Since the  TMS she is cognitively functioning better. She drinks no alcohol uses no drugs. She friend her right available. The patient still has a close relationship with her son. Her son lives with her home.   the patient denies being suicidal. She is very stable at this time. She just started back on her Wellbutrin and Lexapro.  Visit Diagnosis:    ICD-10-CM   1. Major depressive disorder, recurrent episode, moderate (HCC) F33.1 escitalopram (LEXAPRO) 10 MG tablet    buPROPion (WELLBUTRIN XL) 300 MG 24 hr tablet    Past Psychiatric History: See intake H&P for full details. Reviewed, with no updates at this time.   Past Medical History:  Past Medical History:  Diagnosis Date  . Acute right MCA stroke (HCC) 11/07/10  . Atrial fibrillation (HCC)    a. s/p TEE-DCCV 02/2104; b. Xarelto started  . CAD (coronary artery disease)    a.  cath 09/2010: LAD stent patent, S-Int/dCFX ok, S-PDA ok, L-LAD atretic;  b. Lexiscan Myoview (02/2014):  no ischemia, EF 55%; c. 11/2014 Cath/PCI: LM nl, LAD 20pISR, LCX 80-26m, OM1 nl, RI 70p, RCA 40-13m, RPDA 95ost/95-59m (PTCA only w/ reduction to 50p/63m), 60d, L->LAD atretic, VG->RI->OM nl, VG->PDA 100p.  . Cardiomyopathy with EF 40% at TEE 02/17/14 (likely tachycardia mediated - Myoview 02/19/14 neg for ischemia with normal EF) 02/18/2014  . COPD (chronic obstructive pulmonary disease) (HCC)   . Depression   . Eczema   . GERD  (gastroesophageal reflux disease)   . Headache   . HLD (hyperlipidemia)   . Homelessness 11/12/2011  . 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 Hx of CABG   . Hx of transesophageal echocardiography (TEE) for monitoring 11/2010   TEE 11/2010: EF 60-65%, BAE, trivial atrial septal shunt;  right heart cath in 10/2010 with elevated R and L heart pressures and diuretic started  . Hypertension   . Hypothyroidism   . Persistent atrial fibrillation (HCC) 10/2014  . Pulmonary nodules    repeat CT due in 11/2011  . Sleep apnea    recent sleep study  in 04/2014 per chart review  shows no significant OSA    Past Surgical History:  Procedure Laterality Date  . bilateral knee surgery    . BLADDER SURGERY    . CARDIOVERSION N/A 02/17/2014   Procedure: CARDIOVERSION;  Surgeon: Pricilla Riffle, MD;  Location: [facility] ENDOSCOPY;  Service: Cardiovascular;  Laterality: N/A;  . CARDIOVERSION N/A 09/20/2016   Procedure: CARDIOVERSION;  Surgeon: Lars Masson, MD;  Location: [facility] ENDOSCOPY;  Service: Cardiovascular;  Laterality: N/A;  . cath 2012    . CHEST WALL RECONSTRUCTION    . CORONARY ARTERY BYPASS GRAFT    . debriment for infection in chest    . HERNIA REPAIR    .  LEFT AND RIGHT HEART CATHETERIZATION WITH CORONARY ANGIOGRAM N/A 11/10/2014   Procedure: LEFT AND RIGHT HEART CATHETERIZATION WITH CORONARY ANGIOGRAM;  Surgeon: Marykay Lex, MD;  Location: [facility] CATH LAB;  Service: Cardiovascular;  Laterality: N/A;  . Left mastoidectomy    . TEE WITHOUT CARDIOVERSION N/A 02/17/2014   Procedure: TRANSESOPHAGEAL ECHOCARDIOGRAM (TEE);  Surgeon: Pricilla Riffle, MD;  Location: [facility] ENDOSCOPY;  Service: Cardiovascular;  Laterality: N/A;    Family Psychiatric History: See intake H&P for full details. Reviewed, with no updates at this time.   Family History:  Family History  Problem Relation Age of Onset  . COPD Mother   . Heart disease  Mother   . Arthritis Mother        Rheumatoid and PMR  . Osteoporosis Mother        Mom fractured hip  . Diabetes type II Mother   . Depression Mother   . Heart attack Father   . Depression Father   . Hypertension Father   . Alcohol abuse Father   . Depression Sister   . Anxiety disorder Sister   . Drug abuse Sister   . Stroke Neg Hx     Social History:  Social History   Social History  . Marital status: Divorced    Spouse name: N/A  . Number of children: 2  . Years of education: N/A   Occupational History  . DISABLE     Social History Main Topics  . Smoking status: Current Every Day Smoker    Packs/day: 0.50    Years: 30.00    Types: Cigarettes    Last attempt to quit: 09/10/2014  . Smokeless tobacco: Never Used  . Alcohol use No  . Drug use: No  . Sexual activity: Not Currently   Other Topics Concern  . Not on file   Social History Narrative   Divorced   After stroke was living with sister and mother after sister asked her to leave. Mom has since deceased    Difficulties over control.    Then moved back in for about 6 weeks.   . tranportaion difficult son  helping   Ex-smoker   Was living at friends  Sister and her had argument and she was told to leave .    She was living in a homeless shelter for the last 3 weeks [facility]    son had help with transportation and medication       Work status regular  before got sick on short-term disability now lost t insurance after the stroke and couldn't work was  denied ssi    2 times.  She is not eligible for Medicaid because she doesn't have dependent children.         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   Lives in  rented house 2 house mates males keep tp self has her own b room  Doing well      Allergies:  Allergies  Allergen Reactions  . Avelox [Moxifloxacin Hcl In Nacl] Other (See Comments)    Mental breakdown.  [name] [Nortriptyline Hcl] Other (See Comments)    Made her want to hurt herself  . Amoxicillin Hives  . Hydrocodone Itching  . Oxycodone Itching  .  Penicillins Hives    Has patient had a PCN reaction causing immediate rash, facial/tongue/throat swelling, SOB or lightheadedness with hypotension: Yes Has patient had a PCN reaction causing severe rash involving mucus membranes or skin necrosis: Yes Has patient had a PCN reaction that required hospitalization No Has patient had a PCN reaction occurring within the last 10 years: No If all of the above answers are "NO", then may proceed with Cephalosporin use.   . Sulfa Antibiotics Other (See Comments)    unknown    Metabolic Disorder Labs: Lab Results  Component Value Date   HGBA1C  02/11/2011    5.4 (NOTE)                                                                       According to the ADA Clinical Practice Recommendations for 2011, when HbA1c is used as a screening test:   >=6.5%   Diagnostic of Diabetes Mellitus           (if abnormal result  is confirmed)  5.7-6.4%   Increased risk of developing Diabetes Mellitus  References:Diagnosis and Classification of Diabetes Mellitus,Diabetes Care,2011,34(Suppl 1):S62-S69 and Standards of Medical Care in         Diabetes - 2011,Diabetes Care,2011,34  (Suppl 1):S11-S61.   MPG 108 02/11/2011   MPG 111 11/08/2010   Lab Results  Component Value Date   PROLACTIN 7.4 07/02/2011   Lab Results  Component Value Date   CHOL 188 10/08/2016   TRIG 83.0 10/08/2016   HDL 65.60 10/08/2016   CHOLHDL 3 10/08/2016   VLDL 16.6 10/08/2016   LDLCALC 106 (H) 10/08/2016   LDLCALC 143 (H) 03/14/2015   Lab Results  Component Value Date   TSH 2.880 05/07/2017   TSH 0.133 (L) 03/27/2017    Therapeutic Level Labs: No results found for: LITHIUM No results found for:  VALPROATE No components found for:  CBMZ  Current Medications: Current Outpatient Prescriptions  Medication Sig Dispense Refill  . albuterol (PROVENTIL HFA;VENTOLIN HFA) 108 (90 Base) MCG/ACT inhaler Inhale 2 puffs into the lungs every 6 (six) hours as needed for wheezing or shortness of breath. 1 Inhaler 3  . atorvastatin (LIPITOR) 20 MG tablet Take 1 tablet (20 mg total) by mouth daily. 90 tablet 3  . buPROPion (WELLBUTRIN XL) 300 MG 24 hr tablet Take 1 tablet (300 mg total) by mouth every morning. 90 tablet 1  . clopidogrel (PLAVIX) 75 MG tablet Take 1 tablet (75 mg total) by mouth every morning. 90 tablet 3  . escitalopram (LEXAPRO) 10 MG tablet Take 1 tablet (10 mg total) by mouth daily. 90 tablet 1  . fluticasone (FLONASE) 50 MCG/ACT nasal spray Place 2 sprays into both nostrils daily. 48 g 1  . levothyroxine (SYNTHROID) 100 MCG tablet Take 1 tablet (100 mcg total) by mouth daily before breakfast. 30 tablet 11  . magnesium oxide (MAG-OX) 400 MG tablet Take 1 tablet (400 mg total) by mouth daily. 180 tablet 1  . nitroGLYCERIN (NITROSTAT) 0.4 MG SL tablet Place 1 tablet (0.4 mg total) under the tongue every 5 (five) minutes as needed for chest pain. 90 tablet 3  . ondansetron (ZOFRAN-ODT) 4 MG disintegrating tablet DISSOLVE ONE TABLET IN MOUTH EVERY 8 HOURS AS  NEEDED FOR NAUSEA 10 tablet 0  . potassium chloride SA (K-DUR,KLOR-CON) 20 MEQ tablet TAKE 1 TABLET BY MOUTH 3  TIMES DAILY 270 tablet 3  . ranitidine (ZANTAC) 150 MG tablet Take 1 tablet (150 mg total) by mouth 2 (two) times daily. 60 tablet 1  . rivaroxaban (XARELTO) 20 MG TABS tablet TAKE ONE TABLET BY MOUTH ONCE DAILY WITH  SUPPER 90 tablet 1  . Tiotropium Bromide Monohydrate (SPIRIVA RESPIMAT) 2.5 MCG/ACT AERS Inhale 1 puff into the lungs daily. (Patient not taking: Reported on 05/01/2017) 1 Inhaler 1  . topiramate (TOPAMAX) 50 MG tablet Take 1 tablet (50 mg total) by mouth 2 (two) times daily. 180 tablet 5  . topiramate (TOPAMAX)  50 MG tablet TAKE ONE TABLET BY MOUTH TWICE DAILY 60 tablet 0   No current facility-administered medications for this visit.      Musculoskeletal: Strength & Muscle Tone: within normal limits Gait & Station: normal Patient leans: Left  Psychiatric Specialty Exam: ROS  There were no vitals taken for this visit.There is no height or weight on file to calculate BMI.  General Appearance: Casual and Fairly Groomed  Eye Contact:  Good  Speech:  Normal Rate and Slow  Volume:  Normal  Mood:  Dysphoric  Affect:  Appropriate and Congruent  Thought Process:  Goal Directed  Orientation:  Full (Time, Place, and Person)  Thought Content: Logical   Suicidal Thoughts:  No  Homicidal Thoughts:  No  Memory:  Immediate;   Fair  Judgement:  Fair  Insight:  Fair  Psychomotor Activity:  Normal  Concentration:  Attention Span: Fair  Recall:  [employer] of Knowledge: Fair  Language: Good  Akathisia:  Negative  Handed:  Right  AIMS (if indicated): not done  Assets:  Communication Skills Desire for Improvement Housing  ADL's:  Intact  Cognition: WNL  Sleep:  Fair    Assessment and Plan: Today the patient is doing well. She'll continue on Wellbutrin and Lexapro as prescribed. The patient is functioning extremely well. She shows no evidence of psychosis. She'll return to see me in 3 months.  1. Major depressive disorder, recurrent episode, moderate (HCC)    Continue Lexapro and Wellbutrin Follow-up with her primary psychiatrist and restart individual therapy Follow-up as needed with this writer for reconsideration of TMS in the future  Gypsy Balsam, MD 05/21/2017, 4:22 PM

## 2017-05-22 ENCOUNTER — Ambulatory Visit (INDEPENDENT_AMBULATORY_CARE_PROVIDER_SITE_OTHER): Payer: Medicare Other | Admitting: Psychiatry

## 2017-05-22 DIAGNOSIS — F329 Major depressive disorder, single episode, unspecified: Secondary | ICD-10-CM | POA: Diagnosis not present

## 2017-05-22 DIAGNOSIS — F331 Major depressive disorder, recurrent, moderate: Secondary | ICD-10-CM

## 2017-05-29 NOTE — Progress Notes (Signed)
   THERAPIST PROGRESS NOTE  Session Time: 3:05-4:00  Participation Level: Active  Behavioral Response: CasualAlertEuthymic  Type of Therapy: Individual Therapy  Treatment Goals addressed: CopingMotivation  Interventions: CBT  Summary: [name] is a 61 y.o. female who presents with MDD.   Suicidal/Homicidal: Nowithout intent/plan  Therapist Response: Pt. Presents as talkative, responsive to questions, engaged and enthusiastic about therapeutic process, somewhat slowed response time which is attributed to stroke several years ago. This is patient's first session since completion of TMS which pt. Reports was very helpful to her. Pt. Reports that her depression has significantly improved and that her motivation to engage in self-care, she is isolating less, her mood is better. Pt. Discussed that an ongoing challenge is housing and ability to engage in daily cooking because of condition of her home and her son's housing where she is able to do some cooking most day's of the week. Pt. Shares housing with two roommates but conditions are poor because her roommates do not communicate and are very unclean and Pt. Has similar problem with her son. Pt. Tolerates the poor conditions because she believes that she is what she can afford and she has arrangement with her son to use his kitchen and she walks his dogs and borrows her car. Pt. Understands that the arrangement does not benefit her, but believes it is the best that she can do. Pt. Agreed to follow through with referral to [facility] to explore alternative housing options for her. Pt. Also agreed to work through resistance to developing housing options I.e., feelings of disloyalty and co-dependence issues with her son.  Plan: Return again in 2 weeks.  Diagnosis: Major Depressive Disorder   Shaune Pollack, [facility] 05/29/2017

## 2017-05-30 ENCOUNTER — Encounter: Payer: Self-pay | Admitting: Internal Medicine

## 2017-06-08 ENCOUNTER — Other Ambulatory Visit: Payer: Self-pay | Admitting: Neurology

## 2017-06-11 ENCOUNTER — Ambulatory Visit (HOSPITAL_BASED_OUTPATIENT_CLINIC_OR_DEPARTMENT_OTHER): Payer: Medicare Other | Attending: Acute Care | Admitting: Internal Medicine

## 2017-06-11 ENCOUNTER — Ambulatory Visit (INDEPENDENT_AMBULATORY_CARE_PROVIDER_SITE_OTHER): Payer: Medicare Other | Admitting: Psychiatry

## 2017-06-11 ENCOUNTER — Other Ambulatory Visit: Payer: Self-pay | Admitting: Nurse Practitioner

## 2017-06-11 VITALS — Ht 63.0 in | Wt 165.0 lb

## 2017-06-11 DIAGNOSIS — F329 Major depressive disorder, single episode, unspecified: Secondary | ICD-10-CM | POA: Diagnosis not present

## 2017-06-11 DIAGNOSIS — I4891 Unspecified atrial fibrillation: Secondary | ICD-10-CM | POA: Insufficient documentation

## 2017-06-11 DIAGNOSIS — R0683 Snoring: Secondary | ICD-10-CM | POA: Diagnosis not present

## 2017-06-11 DIAGNOSIS — G4730 Sleep apnea, unspecified: Secondary | ICD-10-CM | POA: Diagnosis present

## 2017-06-11 DIAGNOSIS — G4733 Obstructive sleep apnea (adult) (pediatric): Secondary | ICD-10-CM

## 2017-06-11 DIAGNOSIS — F331 Major depressive disorder, recurrent, moderate: Secondary | ICD-10-CM

## 2017-06-11 NOTE — Telephone Encounter (Signed)
LMVM for pt to return call for appt prior to refill or topamax.

## 2017-06-12 NOTE — Telephone Encounter (Signed)
Spoke pt and she was on her way out the door.,  She is not using mail order for her topamax.  She is using [facility].  I offered to make appt now, but she will call tomorrow and do this then will refill med.

## 2017-06-12 NOTE — Progress Notes (Signed)
   THERAPIST PROGRESS NOTE   Session Time: 3:35-4:30  Participation Level: Active  Behavioral Response: CasualAlertEuthymic  Type of Therapy: Individual Therapy  Treatment Goals addressed: CopingMotivation  Interventions: CBT  Summary: [name] is a 61 y.o. female who presents with MDD.   Suicidal/Homicidal: Nowithout intent/plan  Therapist Response: Pt. Continues to present as talkative, responsive to questions, engaged and enthusiastic about therapeutic process, somewhat slowed response time which is attributed to stroke several years ago. Pt. Reports significant progress since last session. Pt. Reports that she has made appointment for tomorrow with the [facility]. Pt. Reports that she has accepted that change in housing is necessary to significant improvement in her mental health. Pt. Discussed that this change was triggered by new roommate who is even more messy than her current roommate. Pt. Also reports that she was able to standup to her son and "ask for what I need from him before I entered crisis". Pt. Was affirmed for this progress. Pt. Also recognized that she needs more income and determined that she needs to document her divorce so that she can apply so for her deceased ex-husband's social security benefit. Pt. Expressed pride in her ability to move forward with this goal. Counselor explained to Pt. That this would be our last session due to insurance and that she would be able to transfer to [facility]. Pt. Expressed gratitude and intention to transfer to new therapist.  Plan: Transfer to [facility].  Diagnosis:      Major Depressive Disorder  Shaune Pollack, [facility] 06/12/2017

## 2017-06-22 DIAGNOSIS — G4733 Obstructive sleep apnea (adult) (pediatric): Secondary | ICD-10-CM | POA: Diagnosis not present

## 2017-06-22 NOTE — Procedures (Signed)
   Patient Name: [name], [name] Date: 06/11/2017 Gender: Female D.O.B: 02-Sep-1956 Age (years): 47 Referring Provider: Bevelyn Ngo NP Height (inches): 63 Interpreting Physician: Jetty Duhamel MD, ABSM Weight (lbs): 165 RPSGT: Rolene Arbour BMI: 29 MRN: [id number] Neck Size: 15.00 CLINICAL INFORMATION Sleep Study Type: NPSG  Indication for sleep study: COPD, OSA  Epworth Sleepiness Score: 21  SLEEP STUDY TECHNIQUE As per the AASM Manual for the Scoring of Sleep and Associated Events v2.3 (April 2016) with a hypopnea requiring 4% desaturations.  The channels recorded and monitored were frontal, central and occipital EEG, electrooculogram (EOG), submentalis EMG (chin), nasal and oral airflow, thoracic and abdominal wall motion, anterior tibialis EMG, snore microphone, electrocardiogram, and pulse oximetry.  MEDICATIONS Medications self-administered by patient taken the night of the study : none reported  SLEEP ARCHITECTURE The study was initiated at 9:55:42 PM and ended at 4:31:15 AM.  Sleep onset time was 22.9 minutes and the sleep efficiency was 65.8%. The total sleep time was 260.2 minutes.  Stage REM latency was 155.0 minutes.  The patient spent 3.46% of the night in stage N1 sleep, 71.94% in stage N2 sleep, 0.00% in stage N3 and 24.60% in REM.  Alpha intrusion was absent.  Supine sleep was 73.92%.  RESPIRATORY PARAMETERS The overall apnea/hypopnea index (AHI) was 0.0 per hour. There were 0 total apneas, including 0 obstructive, 0 central and 0 mixed apneas. There were 0 hypopneas and 1 RERAs.  The AHI during Stage REM sleep was 0.0 per hour.  AHI while supine was 0.0 per hour.  The mean oxygen saturation was 96.00%. The minimum SpO2 during sleep was 93.00%.  moderate snoring was noted during this study.  CARDIAC DATA The 2 lead EKG demonstrated atrial fibrillation with rapid ventricular response. The mean heart rate was 95.17 beats per minute.  Other EKG findings include: None.  LEG MOVEMENT DATA The total PLMS were 0 with a resulting PLMS index of 0.00. Associated arousal with leg movement index was 0.0 .  IMPRESSIONS - No significant obstructive sleep apnea occurred during this study (AHI = 0.0/h). - No significant central sleep apnea occurred during this study (CAI = 0.0/h). - No oxygen desaturation was noted during this study (Min O2 = 93.00%). - The patient snored with moderate snoring volume. - Atrial fibrillation with rapid ventricular response- Mean heart rate 95/ minute. - Clinically significant periodic limb movements did not occur during sleep. No significant associated arousals.  DIAGNOSIS - Atrial fibrillation - Primary Snoring (786.09 [R06.83 ICD-10])  RECOMMENDATIONS - Address cardiac rhythm. - Be careful with alcohol, sedatives and other CNS depressants that may worsen sleep apnea and disrupt normal sleep architecture. - Sleep hygiene should be reviewed to assess factors that may improve sleep quality. - Weight management and regular exercise should be initiated or continued if appropriate.  [Electronically signed] 06/22/2017 11:36 AM  Jetty Duhamel MD, ABSM Diplomate, American Board of Sleep Medicine   NPI: [id number]  ELECTRONICALLY SIGNED ON:  06/22/2017, 11:30 AM [facility] PH: [phone]   FX: [phone] ACCREDITED BY THE AMERICAN ACADEMY OF SLEEP MEDICINE

## 2017-06-23 ENCOUNTER — Telehealth: Payer: Self-pay | Admitting: Acute Care

## 2017-06-23 NOTE — Telephone Encounter (Signed)
I tried calling this patient ar [phone] and there was no answer. Please call her this morning and let her know the sleep study she had done 06/11/2017 showed that she was back in atrial fibrillation.Please have her make an appointment with her cardiologist. Thanks so much.

## 2017-06-23 NOTE — Telephone Encounter (Signed)
ATC pt, no answer. Left message for pt to call back.  

## 2017-06-24 NOTE — Telephone Encounter (Signed)
Pt returned phone call; pt contact # [phone]

## 2017-06-27 NOTE — Telephone Encounter (Signed)
Advised pt of results. Pt understood and nothing further is needed.   

## 2017-07-01 ENCOUNTER — Telehealth: Payer: Self-pay | Admitting: Acute Care

## 2017-07-01 NOTE — Telephone Encounter (Signed)
I have called the patient with the results of her sleep study. I explained that study was negative for obstructive sleep apnea negative for central sleep apnea and that there were no oxygen desaturations noted during this study. I explained that there was notation of atrial fibrillation. I called her to ask her to please follow up with her cardiologist regarding burst of atrial fib. I reminded her to be careful with alcohol sedatives and other central nervous system depressants that may worsen sleep apnea which is for abnormal sleep architecture. We discussed that she needs to be diligent about improved sleep hygiene. Additionally we discussed weight management and regular exercise. The patient states that she would like to come in to the office to discuss next steps for determining her daytime sleepiness. She said that she will call the office and make a follow-up appointment. She is planning to follow-up with her cardiologist. She had no further questions at completion of the call.

## 2017-07-03 ENCOUNTER — Ambulatory Visit (INDEPENDENT_AMBULATORY_CARE_PROVIDER_SITE_OTHER): Payer: Medicare Other | Admitting: Licensed Clinical Social Worker

## 2017-07-03 ENCOUNTER — Encounter (HOSPITAL_COMMUNITY): Payer: Self-pay | Admitting: Licensed Clinical Social Worker

## 2017-07-03 DIAGNOSIS — F331 Major depressive disorder, recurrent, moderate: Secondary | ICD-10-CM | POA: Diagnosis not present

## 2017-07-03 NOTE — Progress Notes (Signed)
   THERAPIST PROGRESS NOTE  Session Time: 9:00am-10:00am  Participation Level: Active  Behavioral Response: CasualAlertEuthymic  Type of Therapy: Individual Therapy  Treatment Goals addressed: Improve Psychiatric Symptoms, elevate mood (increased self-esteem, increased self-compassion, increased interaction), improve unhelpful thought patterns, controlled behavior, moderate mood, deliberate speech and thought process(improved social functioning, healthy adjustment to living situation).  Interventions: Motivational Interviewing, CBT, Grounding & Mindfulness Techniques, psychoeducation  Summary: [name] is a 61 y.o. female who presents with Major Depressive Disorder, recurrent episode, moderate.  Suicidal/Homicidal: No - without intent/plan  Therapist Response: Billiejo met with clinician for an individual session.  [name] discussed her psychiatric symptoms, her current life events and her homework. [name] shared ongoing concerns about her daytime sleepiness. She described her experience of not being able to stay awake and when napping, she is asleep for hours. [name] reports she will follow up with her sleep doctor, as her results from her sleep study did not indicate sleep apnea. She reports she has taken narcolepsy medication in the past which was very helpful.  [name] identified concern about her relationship with her son, which she notes as "co-dependent". [name] explained the interactions and expectations they have of each other, as well as growing resentment she has toward her son for the current state of their relationship. Clinician discussed the importance of boundaries and honesty about feelings. Clinician also recommended joining the co-dependency support group from [facility] and provided schedule.   Plan: Return again in 3-4 weeks  Diagnosis:     Axis I: Major Depressive Disorder, recurrent episode, moderate    [name] 07/03/2017

## 2017-07-08 ENCOUNTER — Telehealth: Payer: Self-pay | Admitting: Cardiology

## 2017-07-08 NOTE — Telephone Encounter (Signed)
07/08/2017 9:30 Returned call to patient who stated she is calling as instructed per Dr Roxy Cedar office.  During split night sleep study episodes of Afib with RVR were noted. Pt states she has had Afib in the past and has documented paroxysmal Afib. Discussed with pt who states she has no new symptoms.  Will forward information to Dr Delton See. TSuits [facility] RN CCM

## 2017-07-08 NOTE — Telephone Encounter (Signed)
Per pt call needs to speak to someone about her Sleep study through out pt was in AFIB. Heart rate low 50-65.  STAT if HR is under 50 or over 120 (normal HR is 60-100 beats per minute)  1) What is your heart rate? 58  2) Do you have a log of your heart rate readings (document readings)? High 50's  3) Do you have any other symptoms? afib.

## 2017-07-08 NOTE — Telephone Encounter (Signed)
Strips are scanned in chart under procedure tab in split night study

## 2017-07-08 NOTE — Telephone Encounter (Signed)
Can we please obtain those strips or at least information how long were a-fib episodes? Thank you, KN

## 2017-07-09 NOTE — Telephone Encounter (Signed)
Returned call to patient Dr.Nelson reviewed strips.Stated she did not have any symptoms.Advised to continue Xarelto.Follow up appointment scheduled with Dr.Nelson 10/15/16.Advised to call sooner if needed.

## 2017-07-09 NOTE — Telephone Encounter (Signed)
I have reviewed the strips, she was in atrial fibrillation, she is already on Xarelto and covered for stroke, please ask her if she is symptomatic.

## 2017-08-06 ENCOUNTER — Encounter (HOSPITAL_COMMUNITY): Payer: Self-pay | Admitting: Licensed Clinical Social Worker

## 2017-08-06 ENCOUNTER — Ambulatory Visit (INDEPENDENT_AMBULATORY_CARE_PROVIDER_SITE_OTHER): Payer: Medicare Other | Admitting: Licensed Clinical Social Worker

## 2017-08-06 DIAGNOSIS — F331 Major depressive disorder, recurrent, moderate: Secondary | ICD-10-CM

## 2017-08-06 NOTE — Progress Notes (Signed)
   THERAPIST PROGRESS NOTE  Session Time: 10:15am-11:00am  Participation Level: Active  Behavioral Response: CasualAlertDepressed  Type of Therapy: Individual Therapy  Treatment Goals addressed: Improve Psychiatric Symptoms, elevate mood (increased self-esteem, increased self-compassion, increased interaction), improve unhelpful thought patterns, controlled behavior, moderate mood, deliberate speech and thought process(improved social functioning, healthy adjustment to living situation), Learn about diagnosis, healthy coping skills  Interventions: Motivational Interviewing, CBT, Grounding & Mindfulness Techniques, psychoeducation  Summary: [name] is a 61 y.o. female who presents with Major Depressive Disorder, recurrent episode, moderate.  Suicidal/Homicidal: No - without intent/plan  Therapist Response: [name] met with clinician for an individual session.  [name] discussed her psychiatric symptoms, her current life events and her homework. 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 However, she has not been able to contact the sleep specialist to get an appointment scheduled. [name] reports she stays at her son's apartment on days when he works in order to keep his dogs quiet. [name] identified frustration due to his apartment being filthy and due to her coming home and smelling like dog. [name] reports she has no other options other than to dog-sit during the day due to the dogs barking when they are alone and the neighbors complaining. [name] reports she feels very lonely and really wants to engage in groups, but she cannot due to the dogs.    Plan: Return again in 4 weeks  Diagnosis:     Axis I: Major Depressive Disorder, recurrent episode, moderate   [name], LCSW 08/06/2017

## 2017-08-20 ENCOUNTER — Ambulatory Visit (HOSPITAL_COMMUNITY): Payer: Self-pay | Admitting: Psychiatry

## 2017-09-18 ENCOUNTER — Ambulatory Visit (HOSPITAL_COMMUNITY): Payer: Self-pay | Admitting: Licensed Clinical Social Worker

## 2017-09-29 ENCOUNTER — Other Ambulatory Visit: Payer: Self-pay | Admitting: Neurology

## 2017-09-29 ENCOUNTER — Encounter: Payer: Self-pay | Admitting: *Deleted

## 2017-10-08 ENCOUNTER — Ambulatory Visit (HOSPITAL_COMMUNITY): Payer: Self-pay | Admitting: Licensed Clinical Social Worker

## 2017-10-13 ENCOUNTER — Encounter: Payer: Self-pay | Admitting: Internal Medicine

## 2017-10-13 ENCOUNTER — Other Ambulatory Visit: Payer: Self-pay | Admitting: Neurology

## 2017-10-13 MED ORDER — TOPIRAMATE 50 MG PO TABS: 50.0000 mg | ORAL_TABLET | Freq: Two times a day (BID) | ORAL | 1 refills | Status: DC

## 2017-10-13 NOTE — Telephone Encounter (Signed)
Copied from CRM [id number]. Topic: Quick Communication - Rx Refill/Question >> Oct 13, 2017 11:14 AM Vivia Ewing A wrote: Medication: Zofran    Has the patient contacted their pharmacy?  Yes, there are no refills left.    (Agent: If no, request that the patient contact the pharmacy for the refill.)   Preferred Pharmacy (with phone number or street name): [facility] [address], [place] - [address] [phone] (Phone) [phone] (Fax)     Agent: Please be advised that RX refills may take up to 3 business days. We ask that you follow-up with your pharmacy.

## 2017-10-14 MED ORDER — ONDANSETRON 4 MG PO TBDP: ORAL_TABLET | ORAL | 0 refills | Status: DC

## 2017-10-14 NOTE — Telephone Encounter (Signed)
Please advise Dr Fabian Sharp - see patient email, requesting something be sent in for nausea d/t ?stomach virus.   Please reply to [facility]

## 2017-10-14 NOTE — Telephone Encounter (Signed)
Refilled but  Advise OV if needing to use more than one day of fever  Vomiting.

## 2017-10-14 NOTE — Telephone Encounter (Signed)
Please advise Dr Fabian Sharp, patient e-mailed again stating that she wants to know if she needs to be seen with her symptoms. Pt has to set up transportation before 5pm today if she needs to be seen.

## 2017-10-15 ENCOUNTER — Ambulatory Visit: Payer: Self-pay | Admitting: Cardiology

## 2017-10-16 ENCOUNTER — Encounter (HOSPITAL_COMMUNITY): Payer: Self-pay | Admitting: Licensed Clinical Social Worker

## 2017-10-16 ENCOUNTER — Ambulatory Visit (INDEPENDENT_AMBULATORY_CARE_PROVIDER_SITE_OTHER): Payer: PPO | Admitting: Licensed Clinical Social Worker

## 2017-10-16 DIAGNOSIS — F331 Major depressive disorder, recurrent, moderate: Secondary | ICD-10-CM | POA: Diagnosis not present

## 2017-10-16 NOTE — Progress Notes (Signed)
   THERAPIST PROGRESS NOTE  Session Time: 1:30pm-2:30pm  Participation Level: Active  Behavioral Response: CasualAlertDepressed  Type of Therapy: Individual Therapy  Treatment Goals addressed: Improve Psychiatric Symptoms, elevate mood (increased self-esteem, increased self-compassion, increased interaction), improve unhelpful thought patterns, controlled behavior, moderate mood, deliberate speech and thought process(improved social functioning, healthy adjustment to living situation), Learn about diagnosis, healthy coping skills  Interventions: Motivational Interviewing, CBT, Grounding & Mindfulness Techniques, psychoeducation  Summary: [name] is a 62 y.o. female who presents with Major Depressive Disorder, recurrent episode, moderate.  Suicidal/Homicidal: No - without intent/plan  Therapist Response: [name] met with clinician for an individual session.  [name] discussed her psychiatric symptoms, her current life events and her homework. [name] shared that she has been sick with a stomach virus all week, which has lowered her mood. She also reports that she has been sleeping a lot during the day. [name] reports she has a new roommate who is her age, a man on disability, who cooks and is a neat freak. She reports he is a great roommate and he has kept her being social since he has moved in. [name] identified that socializing with others improves her mood and she feels much better about her life. She also reports planning to move out on her own due to some improvement in finances coming up. [name] processed interactions with son. She and clinician also discussed limit setting with son.   Plan: Return again in 3-4 weeks  Diagnosis:     Axis I: Major Depressive Disorder, recurrent episode, moderate                             [name], LCSW 10/16/2017

## 2017-10-29 ENCOUNTER — Ambulatory Visit (INDEPENDENT_AMBULATORY_CARE_PROVIDER_SITE_OTHER): Payer: PPO | Admitting: Psychiatry

## 2017-10-29 ENCOUNTER — Encounter (HOSPITAL_COMMUNITY): Payer: Self-pay | Admitting: Psychiatry

## 2017-10-29 VITALS — BP 126/78 | HR 64 | Ht 63.5 in | Wt 164.6 lb

## 2017-10-29 DIAGNOSIS — F331 Major depressive disorder, recurrent, moderate: Secondary | ICD-10-CM

## 2017-10-29 DIAGNOSIS — F330 Major depressive disorder, recurrent, mild: Secondary | ICD-10-CM

## 2017-10-29 DIAGNOSIS — Z79899 Other long term (current) drug therapy: Secondary | ICD-10-CM | POA: Diagnosis not present

## 2017-10-29 MED ORDER — ESCITALOPRAM OXALATE 10 MG PO TABS: 10.0000 mg | ORAL_TABLET | Freq: Every day | ORAL | 1 refills | Status: DC

## 2017-10-29 MED ORDER — BUPROPION HCL ER (XL) 300 MG PO TB24: 300.0000 mg | ORAL_TABLET | Freq: Every morning | ORAL | 2 refills | Status: DC

## 2017-10-29 NOTE — Progress Notes (Signed)
BH MD/PA/NP OP Progress Note  10/29/2017 1:51 PM [name]  MRN:  [id number]  Chief Complaint:   HPI: Patient is doing well. What is interesting however she clearly was diagnosed with narcolepsy many years ago. In between that diagnosis she was also diagnosed with sleep apnealost a great deal of weight no longer has sleep apnea per sleep studies. She has an appointment with a neurologist next week around the issues of possibly being restarted on Provigil for narcolepsy and dealing with her headaches which seem to be continuous. She does take Topamax. She says she eats well but what is very important is that she sleeps in excess of 12 hours 24-hour period Cognitively she is doing well. Emotionally she is very stable. She walks Sons dog on a regular basis. The patient's mood is stable.the patient denies the use of alcohol or drugs. She has no evidence of psychosis. She denies chest pain or shortness of breath or any neurological symptoms she actually is quite stable.   ICD-10-CM   1. Major depressive disorder, recurrent episode, moderate (HCC) F33.1 buPROPion (WELLBUTRIN XL) 300 MG 24 hr tablet    escitalopram (LEXAPRO) 10 MG tablet    Past Psychiatric History: See intake H&P for full details. Reviewed, with no updates at this time.   Past Medical History:  Past Medical History:  Diagnosis Date  . Acute right MCA stroke (HCC) 11/07/10  . Atrial fibrillation (HCC)    a. s/p TEE-DCCV 02/2104; b. Xarelto started  . CAD (coronary artery disease)    a.  cath 09/2010: LAD stent patent, S-Int/dCFX ok, S-PDA ok, L-LAD atretic;  b. Lexiscan Myoview (02/2014):  no ischemia, EF 55%; c. 11/2014 Cath/PCI: LM nl, LAD 20pISR, LCX 80-62m, OM1 nl, RI 70p, RCA 40-84m, RPDA 95ost/95-62m (PTCA only w/ reduction to 50p/64m), 60d, L->LAD atretic, VG->RI->OM nl, VG->PDA 100p.  . Cardiomyopathy with EF 40% at TEE 02/17/14 (likely tachycardia mediated - Myoview 02/19/14 neg for ischemia with normal EF) 02/18/2014  . COPD  (chronic obstructive pulmonary disease) (HCC)   . Depression   . Eczema   . GERD (gastroesophageal reflux disease)   . Headache   . HLD (hyperlipidemia)   . Homelessness 11/12/2011  . 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 Hx of CABG   . Hx of transesophageal echocardiography (TEE) for monitoring 11/2010   TEE 11/2010: EF 60-65%, BAE, trivial atrial septal shunt;  right heart cath in 10/2010 with elevated R and L heart pressures and diuretic started  . Hypertension   . Hypothyroidism   . Persistent atrial fibrillation (HCC) 10/2014  . Pulmonary nodules    repeat CT due in 11/2011  . Sleep apnea    recent sleep study  in 04/2014 per chart review  shows no significant OSA    Past Surgical History:  Procedure Laterality Date  . bilateral knee surgery    . BLADDER SURGERY    . CARDIOVERSION N/A 02/17/2014   Procedure: CARDIOVERSION;  Surgeon: Pricilla Riffle, MD;  Location: [facility] ENDOSCOPY;  Service: Cardiovascular;  Laterality: N/A;  . CARDIOVERSION N/A 09/20/2016   Procedure: CARDIOVERSION;  Surgeon: Lars Masson, MD;  Location: [facility] ENDOSCOPY;  Service: Cardiovascular;  Laterality: N/A;  . cath 2012    . CHEST WALL RECONSTRUCTION    . CORONARY ARTERY BYPASS GRAFT    . debriment for infection in chest    .  HERNIA REPAIR    . LEFT AND RIGHT HEART CATHETERIZATION WITH CORONARY ANGIOGRAM N/A 11/10/2014   Procedure: LEFT AND RIGHT HEART CATHETERIZATION WITH CORONARY ANGIOGRAM;  Surgeon: Marykay Lex, MD;  Location: [facility] CATH LAB;  Service: Cardiovascular;  Laterality: N/A;  . Left mastoidectomy    . TEE WITHOUT CARDIOVERSION N/A 02/17/2014   Procedure: TRANSESOPHAGEAL ECHOCARDIOGRAM (TEE);  Surgeon: Pricilla Riffle, MD;  Location: [facility] ENDOSCOPY;  Service: Cardiovascular;  Laterality: N/A;    Family Psychiatric History: See intake H&P for full details. Reviewed, with no updates at this time.   Family History:   Family History  Problem Relation Age of Onset  . COPD Mother   . Heart disease Mother   . Arthritis Mother        Rheumatoid and PMR  . Osteoporosis Mother        Mom fractured hip  . Diabetes type II Mother   . Depression Mother   . Heart attack Father   . Depression Father   . Hypertension Father   . Alcohol abuse Father   . Depression Sister   . Anxiety disorder Sister   . Drug abuse Sister   . Stroke Neg Hx     Social History:  Social History   Socioeconomic History  . Marital status: Divorced    Spouse name: None  . Number of children: 2  . Years of education: None  . Highest education level: None  Social Needs  . Financial resource strain: None  . Food insecurity - worry: None  . Food insecurity - inability: None  . Transportation needs - medical: None  . Transportation needs - non-medical: None  Occupational History  . Occupation: DISABLE   Tobacco Use  . Smoking status: Current Every Day Smoker    Packs/day: 0.50    Years: 30.00    Pack years: 15.00    Types: Cigarettes    Last attempt to quit: 09/10/2014    Years since quitting: 3.1  . Smokeless tobacco: Never Used  Substance and Sexual Activity  . Alcohol use: No    Alcohol/week: 0.0 oz  . Drug use: No  . Sexual activity: Not Currently  Other Topics Concern  . None  Social History Narrative   Divorced   After stroke was living with sister and mother after sister asked her to leave. Mom has since deceased    Difficulties over control.    Then moved back in for about 6 weeks.   . tranportaion difficult son  helping   Ex-smoker   Was living at friends  Sister and her had argument and she was told to leave .    She was living in a homeless shelter for the last 3 weeks [facility]    son had help with transportation and medication       Work status regular  before got sick on short-term disability now lost t insurance after the stroke and couldn't work was  denied ssi    2 times.  She is not  eligible for Medicaid because she doesn't have dependent children.         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   Lives in  rented house 2 house mates males keep tp  self has her own b room  Doing well     Allergies:  Allergies  Allergen Reactions  . Avelox [Moxifloxacin Hcl In Nacl] Other (See Comments)    Mental breakdown.  [name] [Nortriptyline Hcl] Other (See Comments)    Made her want to hurt herself  . Amoxicillin Hives  . Hydrocodone Itching  . Oxycodone Itching  . Penicillins Hives    Has patient had a PCN reaction causing immediate rash, facial/tongue/throat swelling, SOB or lightheadedness with hypotension: Yes Has patient had a PCN reaction causing severe rash involving mucus membranes or skin necrosis: Yes Has patient had a PCN reaction that required hospitalization No Has patient had a PCN reaction occurring within the last 10 years: No If all of the above answers are "NO", then may proceed with Cephalosporin use.   . Sulfa Antibiotics Other (See Comments)    unknown    Metabolic Disorder Labs: Lab Results  Component Value Date   HGBA1C  02/11/2011    5.4 (NOTE)                                                                       According to the ADA Clinical Practice Recommendations for 2011, when HbA1c is used as a screening test:   >=6.5%   Diagnostic of Diabetes Mellitus           (if abnormal result  is confirmed)  5.7-6.4%   Increased risk of developing Diabetes Mellitus  References:Diagnosis and Classification of Diabetes Mellitus,Diabetes Care,2011,34(Suppl 1):S62-S69 and Standards of Medical Care in         Diabetes - 2011,Diabetes Care,2011,34  (Suppl 1):S11-S61.   MPG 108 02/11/2011   MPG 111 11/08/2010   Lab Results  Component Value  Date   PROLACTIN 7.4 07/02/2011   Lab Results  Component Value Date   CHOL 188 10/08/2016   TRIG 83.0 10/08/2016   HDL 65.60 10/08/2016   CHOLHDL 3 10/08/2016   VLDL 16.6 10/08/2016   LDLCALC 106 (H) 10/08/2016   LDLCALC 143 (H) 03/14/2015   Lab Results  Component Value Date   TSH 2.880 05/07/2017   TSH 0.133 (L) 03/27/2017    Therapeutic Level Labs: No results found for: LITHIUM No results found for: VALPROATE No components found for:  CBMZ  Current Medications: Current Outpatient Medications  Medication Sig Dispense Refill  . albuterol (PROVENTIL HFA;VENTOLIN HFA) 108 (90 Base) MCG/ACT inhaler Inhale 2 puffs into the lungs every 6 (six) hours as needed for wheezing or shortness of breath. 1 Inhaler 3  . atorvastatin (LIPITOR) 20 MG tablet Take 1 tablet (20 mg total) by mouth daily. 90 tablet 3  . buPROPion (WELLBUTRIN XL) 300 MG 24 hr tablet Take 1 tablet (300 mg total) by mouth every morning. 90 tablet 2  . clopidogrel (PLAVIX) 75 MG tablet Take 1 tablet (75 mg total) by mouth every morning. 90 tablet 3  . escitalopram (LEXAPRO) 10 MG tablet Take 1 tablet (10 mg total) by mouth daily. 90 tablet 1  . fluticasone (FLONASE) 50 MCG/ACT nasal spray Place 2 sprays into both nostrils daily. 48 g 1  . levothyroxine (SYNTHROID) 100 MCG tablet Take 1 tablet (100 mcg total) by mouth daily before breakfast. 30 tablet 11  .  magnesium oxide (MAG-OX) 400 MG tablet Take 1 tablet (400 mg total) by mouth daily. 180 tablet 1  . nitroGLYCERIN (NITROSTAT) 0.4 MG SL tablet Place 1 tablet (0.4 mg total) under the tongue every 5 (five) minutes as needed for chest pain. 90 tablet 3  . ondansetron (ZOFRAN-ODT) 4 MG disintegrating tablet DISSOLVE ONE TABLET IN MOUTH EVERY 8 HOURS AS NEEDED FOR NAUSEA 10 tablet 0  . potassium chloride SA (K-DUR,KLOR-CON) 20 MEQ tablet TAKE 1 TABLET BY MOUTH 3  TIMES DAILY 270 tablet 3  . ranitidine (ZANTAC) 150 MG tablet Take 1 tablet (150 mg total) by mouth 2 (two)  times daily. 60 tablet 1  . rivaroxaban (XARELTO) 20 MG TABS tablet TAKE ONE TABLET BY MOUTH ONCE DAILY WITH  SUPPER 90 tablet 1  . Tiotropium Bromide Monohydrate (SPIRIVA RESPIMAT) 2.5 MCG/ACT AERS Inhale 1 puff into the lungs daily. 1 Inhaler 1  . topiramate (TOPAMAX) 50 MG tablet Take 1 tablet (50 mg total) by mouth 2 (two) times daily. 180 tablet 5  . topiramate (TOPAMAX) 50 MG tablet Take 1 tablet (50 mg total) by mouth 2 (two) times daily. 180 tablet 1   No current facility-administered medications for this visit.      Musculoskeletal: Strength & Muscle Tone: within normal limits Gait & Station: normal Patient leans: Left  Psychiatric Specialty Exam: ROS  Blood pressure 126/78, pulse 64, height 5' 3.5" (1.613 m), weight 164 lb 9.6 oz (74.7 kg).Body mass index is 28.7 kg/m.  General Appearance: Casual and Fairly Groomed  Eye Contact:  Good  Speech:  Normal Rate and Slow  Volume:  Normal  Mood:  Dysphoric  Affect:  Appropriate and Congruent  Thought Process:  Goal Directed  Orientation:  Full (Time, Place, and Person)  Thought Content: Logical   Suicidal Thoughts:  No  Homicidal Thoughts:  No  Memory:  Immediate;   Fair  Judgement:  Fair  Insight:  Fair  Psychomotor Activity:  Normal  Concentration:  Attention Span: Fair  Recall:  [employer] of Knowledge: Fair  Language: Good  Akathisia:  Negative  Handed:  Right  AIMS (if indicated): not done  Assets:  Communication Skills Desire for Improvement Housing  ADL's:  Intact  Cognition: WNL  Sleep:  Fair    Assessment and Plan:   oday the patient is doing well. She'll continue taking Lexapro and Wellbutrin as ordered she continues to have issues with her sleep. Survivor problem is that of clinical depression which is much improved since receiving TMS. She continues taking Lexapro and Wellbutrin as ordered. Her second problem is related to his sleep disorder. The patient will be seeing a neurologist around this issue  and also discussed treatment of her headaches. This patient return to see me in 4 months. I believe the patient is very stable at this time. Continue Lexapro and Wellbutrin Follow-up with her primary psychiatrist and restart individual therapy Follow-up as needed with this writer for reconsideration of TMS in the future  Gypsy Balsam, MD 10/29/2017, 1:51 PM

## 2017-11-04 ENCOUNTER — Encounter: Payer: Self-pay | Admitting: Internal Medicine

## 2017-11-04 DIAGNOSIS — R519 Headache, unspecified: Secondary | ICD-10-CM

## 2017-11-04 DIAGNOSIS — G47419 Narcolepsy without cataplexy: Secondary | ICD-10-CM

## 2017-11-04 DIAGNOSIS — R51 Headache: Principal | ICD-10-CM

## 2017-11-04 NOTE — Telephone Encounter (Signed)
Ok to do referral  But   Not sure he  Manages   narcolepsy   I think dr Marylou Flesher  Does this

## 2017-11-07 ENCOUNTER — Telehealth: Payer: Self-pay | Admitting: Family Medicine

## 2017-11-07 NOTE — Telephone Encounter (Signed)
Panosh, Neta Mends, MD at 11/04/2017 6:57 PM   Status: Signed    Ok to do referral  But   Not sure he  Manages   narcolepsy   I think dr Dohmier  Does this       See Mychart message. Nothing further needed.

## 2017-11-07 NOTE — Telephone Encounter (Signed)
Copied from CRM [id number]. Topic: Referral - Request >> Nov 07, 2017  3:05 PM Guinevere Ferrari, NT wrote: CRM for notification. See Telephone encounter for: Patient called and wanted to see if she can get a referral for to see a neurologist. Pt request if she can see G[facility] at [facility]  Neurologist. Pt would like a call back. Pt phone is currently off she can be reached @[phone]  11/07/17.

## 2017-11-17 NOTE — Telephone Encounter (Signed)
Referral placed.

## 2017-11-17 NOTE — Telephone Encounter (Signed)
Referral to be placed. Replied to patient

## 2017-12-10 ENCOUNTER — Ambulatory Visit (INDEPENDENT_AMBULATORY_CARE_PROVIDER_SITE_OTHER): Payer: PPO | Admitting: Licensed Clinical Social Worker

## 2017-12-10 ENCOUNTER — Encounter (HOSPITAL_COMMUNITY): Payer: Self-pay | Admitting: Licensed Clinical Social Worker

## 2017-12-10 DIAGNOSIS — F331 Major depressive disorder, recurrent, moderate: Secondary | ICD-10-CM

## 2017-12-10 NOTE — Progress Notes (Signed)
   THERAPIST PROGRESS NOTE  Session Time: 4:30pm-5:30pm  Participation Level: Active  Behavioral Response: CasualDrowsyDepressed  Type of Therapy: Individual Therapy  Treatment Goals addressed: Improve Psychiatric Symptoms, elevate mood (increased self-esteem, increased self-compassion, increased interaction), improve unhelpful thought patterns, controlled behavior, moderate mood, deliberate speech and thought process(improved social functioning, healthy adjustment to living situation), Learn about diagnosis, healthy coping skills  Interventions: Motivational Interviewing, CBT, Grounding & Mindfulness Techniques, psychoeducation  Summary: [name] is a 62 y.o. female who presents with Major Depressive Disorder, recurrent episode, moderate.  Suicidal/Homicidal: No - without intent/plan  Therapist Response: Maudy met with clinician for an individual session.  Jolina discussed her psychiatric symptoms, her current life events and her homework. Kiira shared that she continues to sleep between 16-18 hours per day, which makes no time for having a life. She reports she has finally scheduled an appointment with a sleep specialist for May, but she was disappointed that her primary doc would not refer her sooner. Sister reports she continues to stay at her son's apartment several days a week with his dogs, but reported that he has done better with cleaning up the apartment and making it nicer for her to be there. Clinician explored activity level during the days and discussed options for keeping herself awake during those hours by reading, listening to music, watching tv, etc. Clinician discussed home life and noted improved mood in the home since new roommate has moved in.   Plan: Return again in 4-5 weeks  Diagnosis:     Axis I: Major Depressive Disorder, recurrent episode, moderate    R , LCSW 12/10/2017  

## 2017-12-25 ENCOUNTER — Encounter: Payer: PPO | Admitting: Podiatry

## 2017-12-25 NOTE — Patient Instructions (Signed)

## 2018-01-07 ENCOUNTER — Encounter: Payer: Self-pay | Admitting: Podiatry

## 2018-01-07 ENCOUNTER — Ambulatory Visit: Payer: PPO | Admitting: Podiatry

## 2018-01-07 DIAGNOSIS — L600 Ingrowing nail: Secondary | ICD-10-CM | POA: Diagnosis not present

## 2018-01-07 DIAGNOSIS — R2681 Unsteadiness on feet: Secondary | ICD-10-CM

## 2018-01-07 NOTE — Patient Instructions (Signed)

## 2018-01-07 NOTE — Progress Notes (Signed)
Subjective:   Patient ID: [name], female   DOB: 62 y.o.   MRN: [id number]   HPI Patient presents with severe thickness of the left hallux nail with incurvated beds and pain with palpation of the entire nail and has significant balance issues with a history of falls on her left side and having had a stroke with diminished muscle strength.  Patient does not smoke and likes to be active but is  scared of falling   Review of Systems  All other systems reviewed and are negative.       Objective:  Physical Exam  Constitutional: She appears well-developed and well-nourished.  Cardiovascular: Intact distal pulses.  Pulmonary/Chest: Effort normal.  Musculoskeletal: Normal range of motion.  Neurological: She is alert.  Skin: Skin is warm.  Nursing note and vitals reviewed.   Vascular status was found to be intact bilateral with diminishment of sharp dull vibratory left and also mild diminishment DTR reflexes.  Patient has a severely thickened left hallux nail is incurvated and tender when pressed from the dorsal direction.  Patient has good digital perfusion and is well oriented x3     Assessment:  Severe nail disease left hallux with thickness and pain upon palpation and weak left leg with balance issues with history of stroke and other pathology     Plan:  H&P x-rays reviewed and recommended long-term balance bracing left to try to provide stability and help prevent falls and also allow her to be active.  She will see the ped orthotist in order to be evaluated fully for this but would make an excellent candidate.  At this point I recommended correction of the hallux nail and removal and I explained this will be a permanent procedure and I allowed her to sign consent form understanding risk.  I infiltrated the left hallux 60 mg like Marcaine mixture sterile prep applied and using sterile his mentation remove the hallux nail entirely and applied phenol 5 applications 30 seconds followed  by alcohol lavage and sterile dressing left foot.  Patient will be seen back for [place] to recheck again and will be seen for balance bracing

## 2018-01-14 DIAGNOSIS — G4719 Other hypersomnia: Secondary | ICD-10-CM | POA: Diagnosis not present

## 2018-01-21 ENCOUNTER — Ambulatory Visit (INDEPENDENT_AMBULATORY_CARE_PROVIDER_SITE_OTHER): Payer: PPO | Admitting: Licensed Clinical Social Worker

## 2018-01-21 ENCOUNTER — Encounter (HOSPITAL_COMMUNITY): Payer: Self-pay | Admitting: Licensed Clinical Social Worker

## 2018-01-21 DIAGNOSIS — F331 Major depressive disorder, recurrent, moderate: Secondary | ICD-10-CM

## 2018-01-21 NOTE — Progress Notes (Signed)
THERAPIST PROGRESS NOTE  Session Time: 10:00am-11:0am  Participation Level: Active  Behavioral Response: NeatAlertDepressed  Type of Therapy: Individual Therapy  Treatment Goals addressed: Improve Psychiatric Symptoms, elevate mood (increased self-esteem, increased self-compassion, increased interaction), improve unhelpful thought patterns, controlled behavior, moderate mood, deliberate speech and thought process(improved social functioning, healthy adjustment to living situation), Learn about diagnosis, healthy coping skills  Interventions: Motivational Interviewing, CBT, Grounding & Mindfulness Techniques, psychoeducation  Summary: [name] is a 62 y.o. female who presents with Major Depressive Disorder, recurrent episode, moderate.  Suicidal/Homicidal: No - without intent/plan  Therapist Response: [name] met with clinician for an individual session.  [name] discussed her psychiatric symptoms, her current life events and her homework. [name] shared ongoing depression, but also concerns about sleep problems. [name] reports she has finally contacted a sleep specialist and will be going in for an evaluation and sleep study soon. [name] identified that between sleeping and fighting off sleep, she is unable to do much else with her life. Clinician explored mood in between sleep, which includes frustration and depression. Clinician discussed relationship with son and noted that finding positive attributes and strengths in son would be very helpful in boosting mood and improving relationship with him. Clinician processed interactions at home with young roommate and landlord. She also identified some excitement about a possible rental home nearby which may be available to her.   Plan: Return again in 3-4 weeks  Diagnosis:     Axis I: Major Depressive Disorder, recurrent episode, moderate  Jessica R Schlosberg, LCSW 01/21/2018  

## 2018-01-22 NOTE — Progress Notes (Signed)
This encounter was created in error - please disregard.

## 2018-01-28 ENCOUNTER — Other Ambulatory Visit: Payer: Self-pay | Admitting: Cardiology

## 2018-01-28 ENCOUNTER — Ambulatory Visit: Payer: Self-pay | Admitting: Cardiology

## 2018-01-28 ENCOUNTER — Other Ambulatory Visit: Payer: Self-pay | Admitting: Acute Care

## 2018-01-28 NOTE — Telephone Encounter (Signed)
Pt is a 62 yr old female who last saw Dr Delton See on 03/27/17. Weight at that visit was 76.4Kg. Pt has pending appt on 02/25/18 with NP in office. Last noted serum creatine on 04/16/17 was 1.27. CrCl is 57 mL/min. Xarelto 20mg  will be refilled until visit. Will have labs draw for Xarelto (CBC/BMET) at pending visit on 02/25/18.

## 2018-01-29 ENCOUNTER — Ambulatory Visit: Payer: PPO | Admitting: Orthotics

## 2018-01-29 DIAGNOSIS — Z79899 Other long term (current) drug therapy: Secondary | ICD-10-CM

## 2018-01-29 DIAGNOSIS — R2681 Unsteadiness on feet: Secondary | ICD-10-CM

## 2018-01-29 NOTE — Progress Notes (Signed)
Patient came in today for casting/evaluation for [place].  Patient has history of falls, and fear of falling.  This is enhanced by hx of psychotic drugs used in past to handle depression and also migrane medication.  She also has a history of stroke (CVA) that has left her left side weak dorsiflexors.  Balance brace L will have dorsi assist hinges.

## 2018-02-25 ENCOUNTER — Ambulatory Visit (HOSPITAL_COMMUNITY): Payer: PPO | Admitting: Psychiatry

## 2018-02-25 ENCOUNTER — Ambulatory Visit: Payer: Self-pay | Admitting: Cardiology

## 2018-02-25 NOTE — Progress Notes (Deleted)
Cardiology Office Note:    Date:  02/25/2018   ID:  [name], DOB 10-16-1955, MRN [id number]  PCP:  Madelin Headings, MD  Cardiologist:  Tobias Alexander, MD  Referring MD: Madelin Headings, MD   No chief complaint on file. ***  History of Present Illness:    [name] is a 62 y.o. female with a past medical history significant for CAD s/p CABG and subsequent stenting of the native LAD (LIMA-LAD found to be atretic), PAF (on amiodarone and Xarelto), chronic combined systolic and diastolic HF (EF 40% in 2015 >> improved to 65-70% in 3/16), HTN, HL, CKD, OSA, CVA in 2012, COPD, bradycardia.    Cardiac catheterization  11/2014 demonstrated patent LAD stent, SVG-RI/OM patent, SVG-PDA occluded (new finding), severe distal PDA disease treated with balloon angioplasty only (This was suboptimal as balloon was not fully expanded). Readmitted 4/2-12/14/2014 with atrial fibrillation with RVR. She had run out of amiodarone 4 days prior to admission. She was hypokalemic with a potassium less than 3. Amiodarone was resumed and Potassium was replaced. Patient returned to NSR.   She was evaluated for atrial fibrillation by Dr. Johney Frame in 09/2016 and was noted to have breakthrough episodes of atrial fibrillation while on amiodarone. The patient was offered radiofrequency  But she declined. Tikosyn was thought to be cost prohibitive. It was stated that if she has additional A. Fib with minimal symptoms, we should stop amiodarone at that time and try rate control strategy. She was also noted to be bradycardic with fatigue and was offered a pacemaker for chronotropic incompetence however she declined that as well. Amiodarone has now been stopped.     --------------------------------------------------  CBC, BMet for Xarelto refill  CAD: status post CABG, last catheterization demonstrated one occluded PDA SVGwith severe distal PDA disease which was then treated with balloon angioplasty only. She continues on  Plavix no aspirin due to need for anticoagulation. Unable to take beta blockers due to bradycardia.  Persistent atrial fibrillation: now on rate control strategy. She continues on Xarelto 20 mg daily for stroke risk reduction.   Essential hypertension  Hypothyroidism  CKD   Past Medical History:  Diagnosis Date  . Acute right MCA stroke (HCC) 11/07/10  . Atrial fibrillation (HCC)    a. s/p TEE-DCCV 02/2104; b. Xarelto started  . CAD (coronary artery disease)    a.  cath 09/2010: LAD stent patent, S-Int/dCFX ok, S-PDA ok, L-LAD atretic;  b. Lexiscan Myoview (02/2014):  no ischemia, EF 55%; c. 11/2014 Cath/PCI: LM nl, LAD 20pISR, LCX 80-37m, OM1 nl, RI 70p, RCA 40-65m, RPDA 95ost/95-66m (PTCA only w/ reduction to 50p/40m), 60d, L->LAD atretic, VG->RI->OM nl, VG->PDA 100p.  . Cardiomyopathy with EF 40% at TEE 02/17/14 (likely tachycardia mediated - Myoview 02/19/14 neg for ischemia with normal EF) 02/18/2014  . COPD (chronic obstructive pulmonary disease) (HCC)   . Depression   . Eczema   . GERD (gastroesophageal reflux disease)   . Headache   . HLD (hyperlipidemia)   . Homelessness 11/12/2011  . 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 Hx of CABG   . Hx of transesophageal echocardiography (TEE) for monitoring 11/2010   TEE 11/2010: EF 60-65%, BAE, trivial atrial septal shunt;  right heart cath in 10/2010 with elevated R and L heart pressures and diuretic started  . Hypertension   . Hypothyroidism   .  Persistent atrial fibrillation (HCC) 10/2014  . Pulmonary nodules    repeat CT due in 11/2011  . Sleep apnea    recent sleep study  in 04/2014 per chart review  shows no significant OSA    Past Surgical History:  Procedure Laterality Date  . bilateral knee surgery    . BLADDER SURGERY    . CARDIOVERSION N/A 02/17/2014   Procedure: CARDIOVERSION;  Surgeon: Pricilla Riffle, MD;  Location: [facility] ENDOSCOPY;  Service:  Cardiovascular;  Laterality: N/A;  . CARDIOVERSION N/A 09/20/2016   Procedure: CARDIOVERSION;  Surgeon: Lars Masson, MD;  Location: [facility] ENDOSCOPY;  Service: Cardiovascular;  Laterality: N/A;  . cath 2012    . CHEST WALL RECONSTRUCTION    . CORONARY ARTERY BYPASS GRAFT    . debriment for infection in chest    . HERNIA REPAIR    . LEFT AND RIGHT HEART CATHETERIZATION WITH CORONARY ANGIOGRAM N/A 11/10/2014   Procedure: LEFT AND RIGHT HEART CATHETERIZATION WITH CORONARY ANGIOGRAM;  Surgeon: Marykay Lex, MD;  Location: [facility] CATH LAB;  Service: Cardiovascular;  Laterality: N/A;  . Left mastoidectomy    . TEE WITHOUT CARDIOVERSION N/A 02/17/2014   Procedure: TRANSESOPHAGEAL ECHOCARDIOGRAM (TEE);  Surgeon: Pricilla Riffle, MD;  Location: [facility] ENDOSCOPY;  Service: Cardiovascular;  Laterality: N/A;    Current Medications: No outpatient medications have been marked as taking for the 02/25/18 encounter (Appointment) with Berton Bon, NP.     Allergies:   Avelox [moxifloxacin hcl in nacl]; Pamelor [nortriptyline hcl]; Amoxicillin; Hydrocodone; Oxycodone; Penicillins; and Sulfa antibiotics   Social History   Socioeconomic History  . Marital status: Divorced    Spouse name: Not on file  . Number of children: 2  . Years of education: Not on file  . Highest education level: Not on file  Occupational History  . Occupation: DISABLE   Social Needs  . Financial resource strain: Not on file  . Food insecurity:    Worry: Not on file    Inability: Not on file  . Transportation needs:    Medical: Not on file    Non-medical: Not on file  Tobacco Use  . Smoking status: Current Every Day Smoker    Packs/day: 0.50    Years: 30.00    Pack years: 15.00    Types: Cigarettes    Last attempt to quit: 09/10/2014    Years since quitting: 3.4  . Smokeless tobacco: Never Used  Substance and Sexual Activity  . Alcohol use: No    Alcohol/week: 0.0 oz  . Drug use: No  . Sexual activity: Not Currently    Lifestyle  . Physical activity:    Days per week: Not on file    Minutes per session: Not on file  . Stress: Not on file  Relationships  . Social connections:    Talks on phone: Not on file    Gets together: Not on file    Attends religious service: Not on file    Active member of club or organization: Not on file    Attends meetings of clubs or organizations: Not on file    Relationship status: Not on file  Other Topics Concern  . Not on file  Social History Narrative   Divorced   After stroke was living with sister and mother after sister asked her to leave. Mom has since deceased    Difficulties over control.    Then moved back in for about 6 weeks.   . tranportaion difficult son  helping   Ex-smoker   Was living at friends  Sister and her had argument and she was told to leave .    She was living in a homeless shelter for the last 3 weeks [facility]    son had help with transportation and medication       Work status regular  before got sick on short-term disability now lost t insurance after the stroke and couldn't work was  denied ssi    2 times.  She is not eligible for Medicaid because she doesn't have dependent children.         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   Lives in  rented house 2 house mates males keep tp self has her own b room  Doing well      Family History: The patient's ***family history includes Alcohol abuse in her father; Anxiety disorder in her sister; Arthritis in her mother; COPD in her mother; Depression in her father, mother, and sister; Diabetes type II in her mother; Drug abuse in her sister; Heart attack in her father; Heart disease in her mother; Hypertension in her father; Osteoporosis in her mother. There is no history of  Stroke. ROS:   Please see the history of present illness.    *** All other systems reviewed and are negative.  EKGs/Labs/Other Studies Reviewed:    The following studies were reviewed today:   Carotid ultrasound 03/15/2015: Normal   LHC/PCI 11/10/14 LM: ok LAD: stent in the proximal segment that is roughly 20% in-stent restenosis. RI: 70% proximal disease.  LCx:  80-90% stenosis in the mid AV groove segment RCA: prox 40%, mid 40-50%, RPDA ostial 95% then 95-99% then 60% L-LAD:  Atretic (old) S-RI/OM:  Patent  S-RPDA:  prox 100% PCI:  Suboptimal PTCA only of the ostial RPDA and mid RPDA leading to the anastomosis site. Unable to fully expand the balloon.  Echo 11/08/14 - Mild focal basal hypertrophy of the septum. LVF vigorous. EF 65% to 70%.  HK basalinferior myocardium. Grade 1 diastolic dysfunction. - Aortic valve: There was trivial regurgitation. - Left atrium: The atrium was mildly dilated. Impressions: Compared to the prior study, there has been no significant interval change.  Myoview 02/19/14 IMPRESSION: No evidence for reversibility or myocardial ischemia.  Calculated ejection fraction is 55%.  Carotid US 5/15 No sig ICA stenosis    EKG:  EKG is *** ordered today.  The ekg ordered today demonstrates ***  Recent Labs: 04/16/2017: ALT 5; BUN 14; Creatinine, Ser 1.27; Hemoglobin 12.9; Magnesium 1.5; Platelets 179.0; Potassium 3.3; Sodium 141 05/07/2017: TSH 2.880   Recent Lipid Panel    Component Value Date/Time   CHOL 188 10/08/2016 0803   TRIG 83.0 10/08/2016 0803   HDL 65.60 10/08/2016 0803   CHOLHDL 3 10/08/2016 0803   VLDL 16.6 10/08/2016 0803   LDLCALC 106 (H) 10/08/2016 0803   LDLDIRECT 153.0 01/15/2010 1501    Physical Exam:    VS:  There were no vitals taken for this visit.    Wt Readings from Last 3 Encounters:  06/11/17 165 lb (74.8 kg)  04/16/17 168 lb 6.4 oz (76.4 kg)  03/27/17 170 lb (77.1 kg)     Physical Exam***   ASSESSMENT:  No diagnosis found. PLAN:    In order of problems listed above:  1. ***   Medication Adjustments/Labs and Tests Ordered: Current medicines are reviewed at length with the patient today.  Concerns regarding medicines are outlined above. Labs and tests ordered and medication changes are outlined in the patient instructions below:  There are no Patient Instructions on file for this visit.   Signed, Berton Bon, NP  02/25/2018 4:58 AM     [facility]

## 2018-02-26 ENCOUNTER — Encounter (HOSPITAL_COMMUNITY): Payer: Self-pay | Admitting: Licensed Clinical Social Worker

## 2018-02-26 ENCOUNTER — Encounter: Payer: Self-pay | Admitting: Cardiology

## 2018-02-26 ENCOUNTER — Ambulatory Visit (INDEPENDENT_AMBULATORY_CARE_PROVIDER_SITE_OTHER): Payer: PPO | Admitting: Licensed Clinical Social Worker

## 2018-02-26 DIAGNOSIS — F331 Major depressive disorder, recurrent, moderate: Secondary | ICD-10-CM | POA: Diagnosis not present

## 2018-02-26 NOTE — Progress Notes (Signed)
   THERAPIST PROGRESS NOTE  Session Time: 12:30pm-1:30pm  Participation Level: Active  Behavioral Response: CasualAlert and DrowsyDepressed  Type of Therapy: Individual Therapy  Treatment Goals addressed: Improve Psychiatric Symptoms, elevate mood (increased self-esteem, increased self-compassion, increased interaction), improve unhelpful thought patterns, controlled behavior, moderate mood, deliberate speech and thought process(improved social functioning, healthy adjustment to living situation), Learn about diagnosis, healthy coping skills  Interventions: Motivational Interviewing, CBT, Grounding & Mindfulness Techniques, psychoeducation  Summary: [name] is a 62 y.o. female who presents with Major Depressive Disorder, recurrent episode, moderate.  Suicidal/Homicidal: No - without intent/plan  Therapist Response: [name] met with clinician for an individual session.  [name] discussed her psychiatric symptoms, her current life events and her homework. [name] shared ongoing depression due to no changes in her daily activity, not being able stay awake for more than an hour or two at a time. [name] reports she feels trapped or like she is in prison. However, she identified one plus with her son who had a charge for a DUI being dropped. She reports he will now be released from prison in May 2020, which is seeming closer every day. Clinician utilized MI OARS to reflect and summarize thoughts and feelings. Clinician identified ways to find light in the darkness and to seek out some pleasure in life.  [name] reports she is waiting for her sleep study and will call to follow up with her doctor and insurance company about authorization.  Plan: Return again in 4 weeks  Diagnosis:     Axis I: Major Depressive Disorder, recurrent episode, moderate  [name], LCSW 02/26/2018

## 2018-03-04 ENCOUNTER — Encounter: Payer: Self-pay | Admitting: Internal Medicine

## 2018-03-04 NOTE — Telephone Encounter (Signed)
Dr Fabian Sharp please advise on new referral needed - pt is needing the referral sent to a different doctor.  Dr. Earl Gala @ [facility]  Pt advised to get a records request faxed over or come by the office to fill out a request so that her notes, labs, etc. could be faxed to the new MD.

## 2018-03-05 ENCOUNTER — Other Ambulatory Visit: Payer: Self-pay

## 2018-03-05 ENCOUNTER — Encounter: Payer: Self-pay | Admitting: Neurology

## 2018-03-05 DIAGNOSIS — G4733 Obstructive sleep apnea (adult) (pediatric): Secondary | ICD-10-CM

## 2018-03-05 DIAGNOSIS — R519 Headache, unspecified: Secondary | ICD-10-CM

## 2018-03-05 DIAGNOSIS — R51 Headache: Principal | ICD-10-CM

## 2018-03-05 NOTE — Telephone Encounter (Signed)
Will send to [facility] to help update the current referral to state changes - Dr. Earl Gala @ [facility] for Dx headaches

## 2018-03-05 NOTE — Telephone Encounter (Signed)
Ok to proceed with new referral   For headaches

## 2018-03-18 ENCOUNTER — Emergency Department (HOSPITAL_COMMUNITY): Payer: PPO

## 2018-03-18 ENCOUNTER — Inpatient Hospital Stay (HOSPITAL_COMMUNITY)
Admission: EM | Admit: 2018-03-18 | Discharge: 2018-03-24 | DRG: 065 | Disposition: A | Discharge status: Skilled Nursing Facility | Payer: PPO | Attending: Neurology | Admitting: Neurology

## 2018-03-18 DIAGNOSIS — R29810 Facial weakness: Secondary | ICD-10-CM | POA: Diagnosis not present

## 2018-03-18 DIAGNOSIS — F17200 Nicotine dependence, unspecified, uncomplicated: Secondary | ICD-10-CM | POA: Diagnosis not present

## 2018-03-18 DIAGNOSIS — E785 Hyperlipidemia, unspecified: Secondary | ICD-10-CM | POA: Diagnosis not present

## 2018-03-18 DIAGNOSIS — T45515A Adverse effect of anticoagulants, initial encounter: Secondary | ICD-10-CM | POA: Diagnosis present

## 2018-03-18 DIAGNOSIS — Z79890 Hormone replacement therapy (postmenopausal): Secondary | ICD-10-CM

## 2018-03-18 DIAGNOSIS — Z79899 Other long term (current) drug therapy: Secondary | ICD-10-CM | POA: Diagnosis not present

## 2018-03-18 DIAGNOSIS — Z888 Allergy status to other drugs, medicaments and biological substances status: Secondary | ICD-10-CM

## 2018-03-18 DIAGNOSIS — F39 Unspecified mood [affective] disorder: Secondary | ICD-10-CM | POA: Diagnosis present

## 2018-03-18 DIAGNOSIS — Z885 Allergy status to narcotic agent status: Secondary | ICD-10-CM | POA: Diagnosis not present

## 2018-03-18 DIAGNOSIS — Z7902 Long term (current) use of antithrombotics/antiplatelets: Secondary | ICD-10-CM | POA: Diagnosis not present

## 2018-03-18 DIAGNOSIS — E782 Mixed hyperlipidemia: Secondary | ICD-10-CM | POA: Diagnosis not present

## 2018-03-18 DIAGNOSIS — I69354 Hemiplegia and hemiparesis following cerebral infarction affecting left non-dominant side: Secondary | ICD-10-CM | POA: Diagnosis not present

## 2018-03-18 DIAGNOSIS — K219 Gastro-esophageal reflux disease without esophagitis: Secondary | ICD-10-CM | POA: Diagnosis present

## 2018-03-18 DIAGNOSIS — E039 Hypothyroidism, unspecified: Secondary | ICD-10-CM | POA: Diagnosis not present

## 2018-03-18 DIAGNOSIS — I615 Nontraumatic intracerebral hemorrhage, intraventricular: Principal | ICD-10-CM

## 2018-03-18 DIAGNOSIS — I429 Cardiomyopathy, unspecified: Secondary | ICD-10-CM | POA: Diagnosis not present

## 2018-03-18 DIAGNOSIS — I610 Nontraumatic intracerebral hemorrhage in hemisphere, subcortical: Secondary | ICD-10-CM | POA: Diagnosis not present

## 2018-03-18 DIAGNOSIS — G4733 Obstructive sleep apnea (adult) (pediatric): Secondary | ICD-10-CM | POA: Diagnosis present

## 2018-03-18 DIAGNOSIS — Z880 Allergy status to penicillin: Secondary | ICD-10-CM

## 2018-03-18 DIAGNOSIS — R51 Headache: Secondary | ICD-10-CM | POA: Diagnosis not present

## 2018-03-18 DIAGNOSIS — R000 Tachycardia, unspecified: Secondary | ICD-10-CM | POA: Diagnosis not present

## 2018-03-18 DIAGNOSIS — Z951 Presence of aortocoronary bypass graft: Secondary | ICD-10-CM

## 2018-03-18 DIAGNOSIS — I481 Persistent atrial fibrillation: Secondary | ICD-10-CM | POA: Diagnosis not present

## 2018-03-18 DIAGNOSIS — I2510 Atherosclerotic heart disease of native coronary artery without angina pectoris: Secondary | ICD-10-CM | POA: Diagnosis not present

## 2018-03-18 DIAGNOSIS — I2489 Other forms of acute ischemic heart disease: Secondary | ICD-10-CM | POA: Diagnosis present

## 2018-03-18 DIAGNOSIS — I130 Hypertensive heart and chronic kidney disease with heart failure and stage 1 through stage 4 chronic kidney disease, or unspecified chronic kidney disease: Secondary | ICD-10-CM | POA: Diagnosis not present

## 2018-03-18 DIAGNOSIS — D6832 Hemorrhagic disorder due to extrinsic circulating anticoagulants: Secondary | ICD-10-CM | POA: Diagnosis not present

## 2018-03-18 DIAGNOSIS — I161 Hypertensive emergency: Secondary | ICD-10-CM | POA: Diagnosis not present

## 2018-03-18 DIAGNOSIS — F17210 Nicotine dependence, cigarettes, uncomplicated: Secondary | ICD-10-CM | POA: Diagnosis present

## 2018-03-18 DIAGNOSIS — Z7901 Long term (current) use of anticoagulants: Secondary | ICD-10-CM

## 2018-03-18 DIAGNOSIS — Z5309 Procedure and treatment not carried out because of other contraindication: Secondary | ICD-10-CM | POA: Diagnosis not present

## 2018-03-18 DIAGNOSIS — Z8673 Personal history of transient ischemic attack (TIA), and cerebral infarction without residual deficits: Secondary | ICD-10-CM | POA: Diagnosis not present

## 2018-03-18 DIAGNOSIS — I5032 Chronic diastolic (congestive) heart failure: Secondary | ICD-10-CM | POA: Diagnosis not present

## 2018-03-18 DIAGNOSIS — I480 Paroxysmal atrial fibrillation: Secondary | ICD-10-CM | POA: Diagnosis present

## 2018-03-18 DIAGNOSIS — R52 Pain, unspecified: Secondary | ICD-10-CM | POA: Diagnosis not present

## 2018-03-18 DIAGNOSIS — I5043 Acute on chronic combined systolic (congestive) and diastolic (congestive) heart failure: Secondary | ICD-10-CM | POA: Diagnosis not present

## 2018-03-18 DIAGNOSIS — I248 Other forms of acute ischemic heart disease: Secondary | ICD-10-CM | POA: Diagnosis present

## 2018-03-18 DIAGNOSIS — Z882 Allergy status to sulfonamides status: Secondary | ICD-10-CM | POA: Diagnosis not present

## 2018-03-18 DIAGNOSIS — G4489 Other headache syndrome: Secondary | ICD-10-CM | POA: Diagnosis not present

## 2018-03-18 DIAGNOSIS — Z955 Presence of coronary angioplasty implant and graft: Secondary | ICD-10-CM

## 2018-03-18 DIAGNOSIS — I25810 Atherosclerosis of coronary artery bypass graft(s) without angina pectoris: Secondary | ICD-10-CM | POA: Diagnosis not present

## 2018-03-18 DIAGNOSIS — R519 Headache, unspecified: Secondary | ICD-10-CM | POA: Diagnosis present

## 2018-03-18 DIAGNOSIS — N183 Chronic kidney disease, stage 3 (moderate): Secondary | ICD-10-CM | POA: Diagnosis present

## 2018-03-18 DIAGNOSIS — Z881 Allergy status to other antibiotic agents status: Secondary | ICD-10-CM

## 2018-03-18 DIAGNOSIS — E876 Hypokalemia: Secondary | ICD-10-CM | POA: Diagnosis present

## 2018-03-18 DIAGNOSIS — I4891 Unspecified atrial fibrillation: Secondary | ICD-10-CM | POA: Diagnosis present

## 2018-03-18 DIAGNOSIS — L309 Dermatitis, unspecified: Secondary | ICD-10-CM | POA: Diagnosis not present

## 2018-03-18 DIAGNOSIS — E43 Unspecified severe protein-calorie malnutrition: Secondary | ICD-10-CM | POA: Diagnosis not present

## 2018-03-18 DIAGNOSIS — I639 Cerebral infarction, unspecified: Secondary | ICD-10-CM | POA: Diagnosis not present

## 2018-03-18 DIAGNOSIS — J449 Chronic obstructive pulmonary disease, unspecified: Secondary | ICD-10-CM | POA: Diagnosis not present

## 2018-03-18 DIAGNOSIS — R410 Disorientation, unspecified: Secondary | ICD-10-CM | POA: Diagnosis not present

## 2018-03-18 DIAGNOSIS — M6281 Muscle weakness (generalized): Secondary | ICD-10-CM | POA: Diagnosis not present

## 2018-03-18 DIAGNOSIS — I63512 Cerebral infarction due to unspecified occlusion or stenosis of left middle cerebral artery: Secondary | ICD-10-CM | POA: Diagnosis not present

## 2018-03-18 DIAGNOSIS — F332 Major depressive disorder, recurrent severe without psychotic features: Secondary | ICD-10-CM | POA: Diagnosis not present

## 2018-03-18 DIAGNOSIS — I25118 Atherosclerotic heart disease of native coronary artery with other forms of angina pectoris: Secondary | ICD-10-CM | POA: Diagnosis not present

## 2018-03-18 DIAGNOSIS — I25709 Atherosclerosis of coronary artery bypass graft(s), unspecified, with unspecified angina pectoris: Secondary | ICD-10-CM | POA: Diagnosis not present

## 2018-03-18 DIAGNOSIS — Z5181 Encounter for therapeutic drug level monitoring: Secondary | ICD-10-CM | POA: Diagnosis not present

## 2018-03-18 DIAGNOSIS — I10 Essential (primary) hypertension: Secondary | ICD-10-CM | POA: Diagnosis not present

## 2018-03-18 DIAGNOSIS — I214 Non-ST elevation (NSTEMI) myocardial infarction: Secondary | ICD-10-CM | POA: Diagnosis not present

## 2018-03-18 DIAGNOSIS — R748 Abnormal levels of other serum enzymes: Secondary | ICD-10-CM | POA: Diagnosis not present

## 2018-03-18 DIAGNOSIS — R4182 Altered mental status, unspecified: Secondary | ICD-10-CM | POA: Diagnosis not present

## 2018-03-18 DIAGNOSIS — G44221 Chronic tension-type headache, intractable: Secondary | ICD-10-CM | POA: Diagnosis not present

## 2018-03-18 DIAGNOSIS — I6300 Cerebral infarction due to thrombosis of unspecified precerebral artery: Secondary | ICD-10-CM | POA: Diagnosis not present

## 2018-03-18 LAB — COMPREHENSIVE METABOLIC PANEL
ALT: 9 U/L (ref 0–44)
ANION GAP: 11 (ref 5–15)
AST: 23 U/L (ref 15–41)
Albumin: 4 g/dL (ref 3.5–5.0)
Alkaline Phosphatase: 64 U/L (ref 38–126)
BUN: 12 mg/dL (ref 6–20)
CHLORIDE: 104 mmol/L (ref 98–111)
CO2: 21 mmol/L — ABNORMAL LOW (ref 22–32)
Calcium: 8.6 mg/dL — ABNORMAL LOW (ref 8.9–10.3)
Creatinine, Ser: 1.2 mg/dL — ABNORMAL HIGH (ref 0.44–1.00)
GFR calc Af Amer: 56 mL/min — ABNORMAL LOW (ref >60)
GFR, EST NON AFRICAN AMERICAN: 48 mL/min — AB (ref >60)
Glucose, Bld: 101 mg/dL — ABNORMAL HIGH (ref 70–99)
POTASSIUM: 4.1 mmol/L (ref 3.5–5.1)
Sodium: 136 mmol/L (ref 135–145)
Total Bilirubin: 1.4 mg/dL — ABNORMAL HIGH (ref 0.3–1.2)
Total Protein: 7.7 g/dL (ref 6.5–8.1)

## 2018-03-18 LAB — CBC WITH DIFFERENTIAL/PLATELET
Abs Immature Granulocytes: 0 10*3/uL (ref 0.0–0.1)
BASOS PCT: 1 %
Basophils Absolute: 0.1 10*3/uL (ref 0.0–0.1)
EOS ABS: 0.1 10*3/uL (ref 0.0–0.7)
Eosinophils Relative: 1 %
HCT: 42.2 % (ref 36.0–46.0)
Hemoglobin: 14.3 g/dL (ref 12.0–15.0)
IMMATURE GRANULOCYTES: 0 %
Lymphocytes Relative: 13 %
Lymphs Abs: 1.1 10*3/uL (ref 0.7–4.0)
MCH: 33.3 pg (ref 26.0–34.0)
MCHC: 33.9 g/dL (ref 30.0–36.0)
MCV: 98.1 fL (ref 78.0–100.0)
Monocytes Absolute: 0.4 10*3/uL (ref 0.1–1.0)
Monocytes Relative: 5 %
NEUTROS PCT: 80 %
Neutro Abs: 6.7 10*3/uL (ref 1.7–7.7)
PLATELETS: 296 10*3/uL (ref 150–400)
RBC: 4.3 MIL/uL (ref 3.87–5.11)
RDW: 13.6 % (ref 11.5–15.5)
WBC: 8.3 10*3/uL (ref 4.0–10.5)

## 2018-03-18 LAB — MRSA PCR SCREENING: MRSA by PCR: NEGATIVE

## 2018-03-18 LAB — URINALYSIS, ROUTINE W REFLEX MICROSCOPIC
BACTERIA UA: NONE SEEN
Bilirubin Urine: NEGATIVE
Glucose, UA: NEGATIVE mg/dL
Hgb urine dipstick: NEGATIVE
KETONES UR: NEGATIVE mg/dL
Leukocytes, UA: NEGATIVE
Nitrite: NEGATIVE
Protein, ur: 100 mg/dL — AB
Specific Gravity, Urine: 1.011 (ref 1.005–1.030)
pH: 7 (ref 5.0–8.0)

## 2018-03-18 LAB — RAPID URINE DRUG SCREEN, HOSP PERFORMED
Amphetamines: NOT DETECTED
BENZODIAZEPINES: NOT DETECTED
COCAINE: NOT DETECTED
OPIATES: NOT DETECTED
Tetrahydrocannabinol: NOT DETECTED

## 2018-03-18 LAB — I-STAT TROPONIN, ED: TROPONIN I, POC: 0.03 ng/mL (ref 0.00–0.08)

## 2018-03-18 MED ORDER — EMPTY CONTAINERS FLEXIBLE MISC
900.0000 mg | Status: AC
  Administered 2018-03-18: 900 mg via INTRAVENOUS
  Filled 2018-03-18: qty 90

## 2018-03-18 MED ORDER — ACETAMINOPHEN 325 MG PO TABS
650.0000 mg | ORAL_TABLET | ORAL | Status: DC | PRN
  Administered 2018-03-18 – 2018-03-21 (×5): 650 mg via ORAL
  Filled 2018-03-18 (×5): qty 2

## 2018-03-18 MED ORDER — LABETALOL HCL 5 MG/ML IV SOLN
20.0000 mg | Freq: Once | INTRAVENOUS | Status: AC
  Administered 2018-03-18: 20 mg via INTRAVENOUS
  Filled 2018-03-18: qty 4

## 2018-03-18 MED ORDER — ONDANSETRON HCL 4 MG/2ML IJ SOLN
4.0000 mg | Freq: Four times a day (QID) | INTRAMUSCULAR | Status: DC | PRN
  Administered 2018-03-18 – 2018-03-23 (×4): 4 mg via INTRAVENOUS
  Filled 2018-03-18 (×4): qty 2

## 2018-03-18 MED ORDER — ONDANSETRON HCL 4 MG/2ML IJ SOLN
4.0000 mg | Freq: Once | INTRAMUSCULAR | Status: AC
  Administered 2018-03-18: 4 mg via INTRAVENOUS
  Filled 2018-03-18: qty 2

## 2018-03-18 MED ORDER — ACETAMINOPHEN 160 MG/5ML PO SOLN: 650.0000 mg | ORAL | Status: DC | PRN

## 2018-03-18 MED ORDER — PANTOPRAZOLE SODIUM 40 MG PO TBEC
40.0000 mg | DELAYED_RELEASE_TABLET | Freq: Every day | ORAL | Status: DC
  Administered 2018-03-19 – 2018-03-24 (×6): 40 mg via ORAL
  Filled 2018-03-18 (×6): qty 1

## 2018-03-18 MED ORDER — LABETALOL HCL 5 MG/ML IV SOLN
INTRAVENOUS | Status: AC
  Administered 2018-03-18: 10:00:00
  Filled 2018-03-18: qty 4

## 2018-03-18 MED ORDER — HYDROMORPHONE HCL 1 MG/ML IJ SOLN
1.0000 mg | Freq: Once | INTRAMUSCULAR | Status: AC
  Administered 2018-03-18: 1 mg via INTRAVENOUS
  Filled 2018-03-18: qty 1

## 2018-03-18 MED ORDER — STROKE: EARLY STAGES OF RECOVERY BOOK
Freq: Once | Status: AC
  Administered 2018-03-18: 14:00:00
  Filled 2018-03-18: qty 1

## 2018-03-18 MED ORDER — DILTIAZEM HCL-DEXTROSE 100-5 MG/100ML-% IV SOLN (PREMIX)
5.0000 mg/h | INTRAVENOUS | Status: DC
  Administered 2018-03-18: 5 mg/h via INTRAVENOUS
  Filled 2018-03-18: qty 100

## 2018-03-18 MED ORDER — NICARDIPINE HCL IN NACL 20-0.86 MG/200ML-% IV SOLN
3.0000 mg/h | INTRAVENOUS | Status: DC
  Filled 2018-03-18: qty 200

## 2018-03-18 MED ORDER — CLEVIDIPINE BUTYRATE 0.5 MG/ML IV EMUL
0.0000 mg/h | INTRAVENOUS | Status: DC
  Administered 2018-03-18: 1 mg/h via INTRAVENOUS
  Administered 2018-03-19 (×3): 5 mg/h via INTRAVENOUS
  Administered 2018-03-19: 1 mg/h via INTRAVENOUS
  Administered 2018-03-20: 2 mg/h via INTRAVENOUS
  Filled 2018-03-18 (×5): qty 50

## 2018-03-18 MED ORDER — IPRATROPIUM-ALBUTEROL 0.5-2.5 (3) MG/3ML IN SOLN: 3.0000 mL | Freq: Four times a day (QID) | RESPIRATORY_TRACT | Status: DC | PRN

## 2018-03-18 MED ORDER — SODIUM CHLORIDE 0.9 % IV SOLN
INTRAVENOUS | Status: DC
  Administered 2018-03-18 – 2018-03-19 (×3): via INTRAVENOUS

## 2018-03-18 MED ORDER — SENNOSIDES-DOCUSATE SODIUM 8.6-50 MG PO TABS
1.0000 | ORAL_TABLET | Freq: Two times a day (BID) | ORAL | Status: DC
  Administered 2018-03-19 – 2018-03-24 (×12): 1 via ORAL
  Filled 2018-03-18 (×13): qty 1

## 2018-03-18 MED ORDER — TRAMADOL HCL 50 MG PO TABS
50.0000 mg | ORAL_TABLET | Freq: Once | ORAL | Status: AC
  Administered 2018-03-18: 50 mg via ORAL
  Filled 2018-03-18: qty 1

## 2018-03-18 MED ORDER — SODIUM CHLORIDE 0.9 % IV SOLN
Freq: Once | INTRAVENOUS | Status: AC
  Administered 2018-03-18: 09:00:00 via INTRAVENOUS

## 2018-03-18 MED ORDER — LEVETIRACETAM IN NACL 1000 MG/100ML IV SOLN: 1000.0000 mg | Freq: Two times a day (BID) | INTRAVENOUS | Status: DC

## 2018-03-18 MED ORDER — DILTIAZEM LOAD VIA INFUSION
10.0000 mg | Freq: Once | INTRAVENOUS | Status: AC
  Administered 2018-03-18: 10 mg via INTRAVENOUS
  Filled 2018-03-18: qty 10

## 2018-03-18 MED ORDER — ACETAMINOPHEN 650 MG RE SUPP: 650.0000 mg | RECTAL | Status: DC | PRN

## 2018-03-18 MED ORDER — LABETALOL HCL 5 MG/ML IV SOLN
10.0000 mg | INTRAVENOUS | Status: DC | PRN
  Administered 2018-03-18 – 2018-03-24 (×17): 10 mg via INTRAVENOUS
  Filled 2018-03-18 (×15): qty 4

## 2018-03-18 NOTE — Consult Note (Deleted)
Neurology H&P  CC: Headache   History is obtained from: Patient  HPI: [name] is a 62 y.o. female with a history of atrial fibrillation, hypertension who presents with intraventricular hemorrhage.  She states that she started having a headache yesterday evening.  She denies diplopia, numbness, weakness, but does endorse some unsteadiness when she was walking.  She has a left sided facial droop and left-sided weakness that is been baseline since the stroke 8 years ago.   LKW: 7/9, evening tpa given?: no, ICH ICH Score: 0 Modified Rankin Scale: 0-Completely asymptomatic and back to baseline post- stroke  ROS: A 14 point ROS was performed and is negative except as noted in the HPI.   Past Medical History:  Diagnosis Date  . Acute right MCA stroke (HCC) 11/07/10  . Atrial fibrillation (HCC)    a. s/p TEE-DCCV 02/2104; b. Xarelto started  . CAD (coronary artery disease)    a.  cath 09/2010: LAD stent patent, S-Int/dCFX ok, S-PDA ok, L-LAD atretic;  b. Lexiscan Myoview (02/2014):  no ischemia, EF 55%; c. 11/2014 Cath/PCI: LM nl, LAD 20pISR, LCX 80-27m, OM1 nl, RI 70p, RCA 40-80m, RPDA 95ost/95-74m (PTCA only w/ reduction to 50p/61m), 60d, L->LAD atretic, VG->RI->OM nl, VG->PDA 100p.  . Cardiomyopathy with EF 40% at TEE 02/17/14 (likely tachycardia mediated - Myoview 02/19/14 neg for ischemia with normal EF) 02/18/2014  . COPD (chronic obstructive pulmonary disease) (HCC)   . Depression   . Eczema   . GERD (gastroesophageal reflux disease)   . Headache   . HLD (hyperlipidemia)   . Homelessness 11/12/2011  . 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 Hx of CABG   . Hx of transesophageal echocardiography (TEE) for monitoring 11/2010   TEE 11/2010: EF 60-65%, BAE, trivial atrial septal shunt;  right heart cath in 10/2010 with elevated R and L heart pressures and diuretic started  . Hypertension   .  Hypothyroidism   . Persistent atrial fibrillation (HCC) 10/2014  . Pulmonary nodules    repeat CT due in 11/2011  . Sleep apnea    recent sleep study  in 04/2014 per chart review  shows no significant OSA     Family History  Problem Relation Age of Onset  . COPD Mother   . Heart disease Mother   . Arthritis Mother        Rheumatoid and PMR  . Osteoporosis Mother        Mom fractured hip  . Diabetes type II Mother   . Depression Mother   . Heart attack Father   . Depression Father   . Hypertension Father   . Alcohol abuse Father   . Depression Sister   . Anxiety disorder Sister   . Drug abuse Sister   . Stroke Neg Hx      Social History:  reports that she has been smoking cigarettes.  She has a 15.00 pack-year smoking history. She has never used smokeless tobacco. She reports that she does not drink alcohol or use drugs.   Exam: Current vital signs: BP (!) 131/104   Pulse 72   Temp 98 F (36.7 C) (Oral)   Resp 15   Ht 5\' 3"  (1.6 m)   Wt 98.9 kg (218 lb)   SpO2 98%   BMI 38.62 kg/m  Vital signs in last 24 hours: Temp:  [16  F (36.7 C)] 98 F (36.7 C) (07/10 0637) Pulse Rate:  [71-126] 72 (07/10 0850) Resp:  [15-28] 15 (07/10 0850) BP: (127-241)/(94-180) 131/104 (07/10 0850) SpO2:  [94 %-100 %] 98 % (07/10 0850) Weight:  [98.9 kg (218 lb)] 98.9 kg (218 lb) (07/10 8416)  Physical Exam  Constitutional: Appears well-developed and well-nourished.  Psych: Affect appropriate to situation Eyes: No scleral injection HENT: No OP obstrucion Head: Normocephalic.  Cardiovascular: Normal rate and regular rhythm.  Respiratory: Effort normal and breath sounds normal to anterior ascultation GI: Soft.  No distension. There is no tenderness.  Skin: WDI  Neuro: Mental Status: Patient is awake, alert, oriented to person, place, month, year, and situation. Patient is able to give a clear and coherent history. No signs of aphasia or neglect Cranial Nerves: II: Visual  Fields are full. Pupils are equal, round, and reactive to light.   III,IV, VI: EOMI without ptosis or diploplia.  V: Facial sensation is symmetric to temperature VII: Facial movement with left sided weakness VIII: hearing is intact to voice X: Uvula elevates symmetrically XI: Shoulder shrug is symmetric. XII: tongue is midline without atrophy or fasciculations.  Motor: Tone is normal. Bulk is normal.  5/5 strength was present in the right arm and right leg and left leg, 4+/5 strength in the left arm with slowed movements Sensory: Sensation is symmetric to light touch and temperature in the arms and legs. Cerebellar: Finger-nose finger is slower on the left than the right   I have reviewed labs in epic and the results pertinent to this consultation are: CMP-unremarkable CBC-normal UDS-normal  I have reviewed the images obtained: CT head- borderline elevation in creatinine  Primary Diagnosis:  Intracranial hemorrhage   Secondary Diagnosis: Accelerated hypertension(SBP > 180 or DBP > 11) & end organ damage) and CKD Stage 3 (GFR 30-59), chronic CHF, COPD   Impression: 62 year old female with a history of hypertension, anticoagulation who presents with intraventricular hemorrhage likely secondary to hypertension and anticoagulation.   She has already received factor X a reversal and is being admitted to the ICU for close monitoring and blood pressure control.  Recommendations: 1) Admit to ICU 2) no antiplatelets or anticoagulants 3) blood pressure control with goal systolic 120 - 140 4) Frequent neuro checks 5) If symptoms worsen or there is decreased mental status, repeat stat head CT 6) PT,OT,ST    This patient is critically ill and at significant risk of neurological worsening, death and care requires constant monitoring of vital signs, hemodynamics,respiratory and cardiac monitoring, neurological assessment, discussion with family, other specialists and medical decision  making of high complexity. I spent 50 minutes of neurocritical care time  in the care of  this patient.  Ritta Slot, MD [facility] [phone]  If 7pm- 7am, please page neurology on call as listed in AMION. 03/18/2018  9:06 AM

## 2018-03-18 NOTE — Progress Notes (Signed)
PT Cancellation Note  Patient Details Name: [name] MRN: [id number] DOB: [birth year]   Cancelled Treatment:    Reason Eval/Treat Not Completed: Patient not medically ready;Active bedrest order   Fabio Asa 03/18/2018, 2:44 PM

## 2018-03-18 NOTE — ED Notes (Signed)
ED Provider at bedside. 

## 2018-03-18 NOTE — ED Notes (Signed)
EDP notified of VS and given EKG.

## 2018-03-18 NOTE — ED Triage Notes (Signed)
Per [facility], pt arrives from home with complaints of increased lethargy, migraine x1 month, and photosensitivity. Pt has hx of stroke with left sided deficits. Pt denies any increased weakness or slurred speech. Pt has left facial droop at baseline. No drifts noticed in arms or legs. Pt is alert and oriented.

## 2018-03-18 NOTE — Progress Notes (Signed)
OT Cancellation Note  Patient Details Name: ARLENIS HOUGHAM MRN: [id number] DOB: [birth year]   Cancelled Treatment:    Reason Eval/Treat Not Completed: Patient not medically ready(bedrest orders/ please update activity orders as appropriate)  Harolyn Rutherford  [phone] 03/18/2018, 11:18 AM

## 2018-03-18 NOTE — Consult Note (Signed)
Reason for Consult: Interventricular hemorrhage Intraventricular hemorrhage Referring Physician: rgency department  [name] is an 62 y.o. female.  HPI: 38-year-old female has had chronic [name] last few weeks sudden onset headache yesterday that she's been dealing with lobe and nausea associated with it she denies any loss of consciousness. She denies any difficulty with her vision nausea vomiting right now or numbness tingling   Past Medical History:  Diagnosis Date  . Acute right MCA stroke ([place]) 11/07/10  . Atrial fibrillation ([place])    a. s/p TEE-DCCV 02/2104; b. Xarelto started  . CAD (coronary artery disease)    a.  cath 09/2010: LAD stent patent, S-Int/dCFX ok, S-PDA ok, L-LAD atretic;  b. Lexiscan Myoview (02/2014):  no ischemia, EF 55%; c. 11/2014 Cath/PCI: LM nl, LAD 20pISR, LCX 80-76m OM1 nl, RI 70p, RCA 40-543mRPDA 95ost/95-9547mTCA only w/ reduction to 50p/35m26m0d, L->LAD atretic, VG->RI->OM nl, VG->PDA 100p.  . Cardiomyopathy with EF 40% at TEE 02/17/14 (likely tachycardia mediated - Myoview 02/19/14 neg for ischemia with normal EF) 02/18/2014  . COPD (chronic obstructive pulmonary disease) (HCC)[place]. Depression   . Eczema   . GERD (gastroesophageal reflux disease)   . Headache   . HLD (hyperlipidemia)   . Homelessness 11/12/2011  . HPV test positive    with Ascus on pap 2015, followed by Dr HollMatthew SarasHx MRSA infection    Chest wall syndrome post CABG  . Hx of CABG   . Hx of transesophageal echocardiography (TEE) for monitoring 11/2010   TEE 11/2010: EF 60-65%, BAE, trivial atrial septal shunt;  right heart cath in 10/2010 with elevated R and L heart pressures and diuretic started  . Hypertension   . Hypothyroidism   . Persistent atrial fibrillation (HCC)[place]/2016  . Pulmonary nodules    repeat CT due in 11/2011  . Sleep apnea    recent sleep study  in 04/2014 per chart review  shows no significant OSA    Past Surgical History:  Procedure Laterality Date  . bilateral  knee surgery    . BLADDER SURGERY    . CARDIOVERSION N/A 02/17/2014   Procedure: CARDIOVERSION;  Surgeon: PaulFay Records;  [facility];  Service: Cardiovascular;  Laterality: N/A;  . CARDIOVERSION N/A 09/20/2016   Procedure: CARDIOVERSION;  Surgeon: KataDorothy Spark;  [facility];  Laterality: N/A;  . cath 2012    . CHEST WALL RECONSTRUCTION    . CORONARY ARTERY BYPASS GRAFT    . debriment for infection in chest    . HERNIA REPAIR    . LEFT AND RIGHT HEART CATHETERIZATION WITH CORONARY ANGIOGRAM N/A 11/10/2014   Procedure: LEFT AND RIGHT HEART CATHETERIZATION WITH CORONARY ANGIOGRAM;  Surgeon: DaviLeonie Man;  [facility];  Service: Cardiovascular;  Laterality: N/A;  . Left mastoidectomy    . TEE WITHOUT CARDIOVERSION N/A 02/17/2014   Procedure: TRANSESOPHAGEAL ECHOCARDIOGRAM (TEE);  Surgeon: PaulFay Records;  [facility];  Service: Cardiovascular;  Laterality: N/A;    Family History  Problem Relation Age of Onset  . COPD Mother   . Heart disease Mother   . Arthritis Mother        Rheumatoid and PMR  . Osteoporosis Mother        Mom fractured hip  . Diabetes type II Mother   . Depression Mother   . Heart attack Father   . Depression Father   . Hypertension  Father   . Alcohol abuse Father   . Depression Sister   . Anxiety disorder Sister   . Drug abuse Sister   . Stroke Neg Hx     Social History:  reports that she has been smoking cigarettes.  She has a 15.00 pack-year smoking history. She has never used smokeless tobacco. She reports that she does not drink alcohol or use drugs.  Allergies:    Medications: I have reviewed the patient's current medications.  Results for orders placed or performed during the hospital encounter of 03/18/18 (from the past 48 hour(s))  CBC with Differential/Platelet     Status: None   Collection Time: 03/18/18  6:58 AM  Result Value Ref Range   WBC 8.3 4.0 - 10.5  K/uL   RBC 4.30 3.87 - 5.11 MIL/uL   Hemoglobin 14.3 12.0 - 15.0 g/dL   HCT 42.2 36.0 - 46.0 %   MCV 98.1 78.0 - 100.0 fL   MCH 33.3 26.0 - 34.0 pg   MCHC 33.9 30.0 - 36.0 g/dL   RDW 13.6 11.5 - 15.5 %   Platelets 296 150 - 400 K/uL   Neutrophils Relative % 80 %   Neutro Abs 6.7 1.7 - 7.7 K/uL   Lymphocytes Relative 13 %   Lymphs Abs 1.1 0.7 - 4.0 K/uL   Monocytes Relative 5 %   Monocytes Absolute 0.4 0.1 - 1.0 K/uL   Eosinophils Relative 1 %   Eosinophils Absolute 0.1 0.0 - 0.7 K/uL   Basophils Relative 1 %   Basophils Absolute 0.1 0.0 - 0.1 K/uL   Immature Granulocytes 0 %   Abs Immature Granulocytes 0.0 0.0 - 0.1 K/uL    Comment: Performed at [facility], [address] [place], [place] [address]  Comprehensive metabolic panel     Status: Abnormal   Collection Time: 03/18/18  6:58 AM  Result Value Ref Range   Sodium 136 135 - 145 mmol/L   Potassium 4.1 3.5 - 5.1 mmol/L    Comment: HEMOLYSIS AT THIS LEVEL MAY AFFECT RESULT   Chloride 104 98 - 111 mmol/L    Comment: Please note change in reference range.   CO2 21 (L) 22 - 32 mmol/L   Glucose, Bld 101 (H) 70 - 99 mg/dL    Comment: Please note change in reference range.   BUN 12 6 - 20 mg/dL    Comment: Please note change in reference range.   Creatinine, Ser 1.20 (H) 0.44 - 1.00 mg/dL   Calcium 8.6 (L) 8.9 - 10.3 mg/dL   Total Protein 7.7 6.5 - 8.1 g/dL   Albumin 4.0 3.5 - 5.0 g/dL   AST 23 15 - 41 U/L   ALT 9 0 - 44 U/L    Comment: Please note change in reference range.   Alkaline Phosphatase 64 38 - 126 U/L   Total Bilirubin 1.4 (H) 0.3 - 1.2 mg/dL   GFR calc non Af Amer 48 (L) >60 mL/min   GFR calc Af Amer 56 (L) >60 mL/min    Comment: (NOTE) The eGFR has been calculated using the CKD EPI equation. This calculation has not been validated in all clinical situations. eGFR's persistently <60 mL/min signify possible Chronic Kidney Disease.    Anion gap 11 5 - 15    Comment: Performed at [facility] [address] [place], [place] [address]  I-stat troponin, ED     Status: None   Collection Time: 03/18/18  7:05  AM  Result Value Ref Range   Troponin i, poc 0.03 0.00 - 0.08 ng/mL   Comment 3            Comment: Due to the release kinetics of cTnI, a negative result within the first hours of the onset of symptoms does not rule out myocardial infarction with certainty. If myocardial infarction is still suspected, repeat the test at appropriate intervals.   Rapid urine drug screen (hospital performed)     Status: Abnormal   Collection Time: 03/18/18  8:40 AM  Result Value Ref Range   Opiates NONE DETECTED NONE DETECTED   Cocaine NONE DETECTED NONE DETECTED   Benzodiazepines NONE DETECTED NONE DETECTED   Amphetamines NONE DETECTED NONE DETECTED   Tetrahydrocannabinol NONE DETECTED NONE DETECTED   Barbiturates (A) NONE DETECTED    Result not available. Reagent lot number recalled by manufacturer.    Comment: Performed at [facility], [place] [address], [place], [place] [address]  Urinalysis, Routine w reflex microscopic     Status: Abnormal   Collection Time: 03/18/18  8:40 AM  Result Value Ref Range   Color, Urine YELLOW YELLOW   APPearance CLEAR CLEAR   Specific Gravity, Urine 1.011 1.005 - 1.030   pH 7.0 5.0 - 8.0   Glucose, UA NEGATIVE NEGATIVE mg/dL   Hgb urine dipstick NEGATIVE NEGATIVE   Bilirubin Urine NEGATIVE NEGATIVE   Ketones, ur NEGATIVE NEGATIVE mg/dL   Protein, ur 100 (A) NEGATIVE mg/dL   Nitrite NEGATIVE NEGATIVE   Leukocytes, UA NEGATIVE NEGATIVE   RBC / HPF 0-5 0 - 5 RBC/hpf   WBC, UA 0-5 0 - 5 WBC/hpf   Bacteria, UA NONE SEEN NONE SEEN   Mucus PRESENT     Comment: Performed at [facility] [address], [place], [place] [address]  MRSA PCR Screening     Status: None   Collection Time: 03/18/18  9:51 AM  Result Value Ref Range   MRSA by PCR NEGATIVE NEGATIVE    Comment:        The GeneXpert MRSA Assay (FDA approved for NASAL  specimens only), is one component of a comprehensive MRSA colonization surveillance program. It is not intended to diagnose MRSA infection nor to guide or monitor treatment for MRSA infections. Performed at [facility], [place] [address] [place], [place] [address]     Ct Head Wo Contrast  Result Date: 03/18/2018 CLINICAL DATA:  Headache and lethargy. EXAM: CT HEAD WITHOUT CONTRAST TECHNIQUE: Contiguous axial images were obtained from the base of the skull through the vertex without intravenous contrast. COMPARISON:  March 18, 2015 FINDINGS: Brain: There is mild diffuse atrophy, stable. Hemorrhage fills the left lateral ventricle and much of the third lateral ventricle. A small amount of hemorrhage extends into the posterior aspect of the frontal horn of the right lateral ventricle. This hemorrhage is causing slight dilatation of the left lateral ventricle. No hemorrhage is evident outside of the ventricular system currently. There is 3 mm of midline shift toward the right. There is evidence of a prior infarct involving portions of the posterior right frontal and superior right temporal lobe with infarct involving much of the right lentiform nucleus as well as the right claustrum, extreme capsule, and insular cortex. Elsewhere there is patchy small vessel disease in the centra semiovale bilaterally. No well-defined acute infarct evident. No subdural or epidural fluid collections are evident. No mass evident. There is stable prominence of the cavernous sinus region  on the right compared to the left, stable from 2016. Vascular: There is no hyperdense vessel. There is calcification in each carotid siphon. Skull: Bony calvarium appears intact. Sinuses/Orbits: There is mucosal thickening in multiple ethmoid air cells. Orbits appear symmetric bilaterally. Other: Patient has had previous mastoidectomy on the left. There is opacification of the remaining mastoid air cells on the left. There is opacification  in multiple mastoid air cells on the right. There is a sebaceous cyst posterior to the midline occipital bones measuring 8 x 7 mm. IMPRESSION: Focal hemorrhage filling most of the left lateral and third ventricles with mild extension into the posterior aspect of the frontal horn of the right lateral ventricle. There is slight dilatation of the left lateral ventricle. There is 3 mm of midline shift toward the right. No hemorrhage is seen within the brain parenchyma on this study. There is atrophy. There is prior infarct involving portions of the posterior right frontal and superior anterior right temporal lobes as well as much of the right claustrum, extreme capsule, and insular cortex. Much of the right lentiform nucleus is involved with this infarct as well. There is patchy periventricular small vessel disease elsewhere without evident acute infarct. There are foci of arterial vascular calcification. There is mucosal thickening in multiple ethmoid air cells. There is extensive mastoid disease bilaterally with evidence of previous mastoidectomy on the left. Critical Value/emergent results were called by telephone at the time of interpretation on 03/18/2018 at 7:47 am to Dr. Milton Ferguson , who verbally acknowledged these results. Electronically Signed   By: Lowella Grip III M.D.   On: 03/18/2018 07:47   Dg Chest Portable 1 View  Result Date: 03/18/2018 CLINICAL DATA:  Atrial fibrillation. EXAM: PORTABLE CHEST 1 VIEW COMPARISON:  Jan 08, 2017 FINDINGS: There is no edema or consolidation. Heart is upper normal in size with pulmonary vascularity normal. Patient is status post coronary artery bypass grafting. There are surgical clips over the lateral upper right hemithorax. There is aortic atherosclerosis. No evident bone lesions. IMPRESSION: No edema or consolidation. Stable cardiac silhouette. Status post coronary artery bypass grafting. There is aortic atherosclerosis. Aortic Atherosclerosis (ICD10-I70.0).  Electronically Signed   By: Lowella Grip III M.D.   On: 03/18/2018 07:14    Review of Systems  Neurological: Positive for dizziness and headaches.   Blood pressure (!) 135/109, pulse (!) 25, temperature 98.2 F (36.8 C), temperature source Oral, resp. rate (!) 24, height '5\' 4"'  (1.626 m), weight 77.7 kg (171 lb 4.8 oz), SpO2 93 %. Physical Exam  Constitutional: She is oriented to person, place, and time.  Neurological: She is alert and oriented to person, place, and time. She has normal strength. GCS eye subscore is 4. GCS verbal subscore is 5. GCS motor subscore is 6.  Patient is awake alert oriented 3 pupils are equal and symmetric movements are intact appears to have a very slight left central seventh she has she has what appears to be 5 out of 5 strength  but appears to be 5 out of 5 strength with no pronator drift with no pronator drift despite history of baseline slight left-sided weakness. despite history of baseline slight left-sided weakness.      Assessment/Plan: 62 year old female presented on Xarelto for previous stroke 7 years ago right frontal.. She presented with a 48 hour history of headache and nausea improved denies any loss of consciousness.  She presented on Xarelto she was rapidly reversed she will be observed she currently does not need  neurosurgical intervention she does not need any ventricular drain due to.  significantly reliable clinical exam.  [name] P 03/18/2018, 1:17 PM

## 2018-03-18 NOTE — ED Notes (Signed)
Diltiazem paused. MD Zammit aware. After 20 mg labetalol, patient HR in the 70's, remains in a fib.

## 2018-03-18 NOTE — Progress Notes (Signed)
Pt hypertensive on arrival, no orders. Dr Amada Jupiter notified, orders received.

## 2018-03-18 NOTE — H&P (Signed)
Neurology H&P  CC: Headache   History is obtained from: Patient  HPI: [name] is a 61 y.o. female with a history of atrial fibrillation, hypertension who presents with intraventricular hemorrhage.  She states that she started having a headache yesterday evening.  She denies diplopia, numbness, weakness, but does endorse some unsteadiness when she was walking.  She has a left sided facial droop and left-sided weakness that is been baseline since the stroke 8 years ago.   LKW: 7/9, evening tpa given?: no, ICH ICH Score: 0 Modified Rankin Scale: 0-Completely asymptomatic and back to baseline post- stroke  ROS: A 14 point ROS was performed and is negative except as noted in the HPI.   Past Medical History:  Diagnosis Date  . Acute right MCA stroke (HCC) 11/07/10  . Atrial fibrillation (HCC)    a. s/p TEE-DCCV 02/2104; b. Xarelto started  . CAD (coronary artery disease)    a.  cath 09/2010: LAD stent patent, S-Int/dCFX ok, S-PDA ok, L-LAD atretic;  b. Lexiscan Myoview (02/2014):  no ischemia, EF 55%; c. 11/2014 Cath/PCI: LM nl, LAD 20pISR, LCX 80-18m, OM1 nl, RI 70p, RCA 40-42m, RPDA 95ost/95-14m (PTCA only w/ reduction to 50p/22m), 60d, L->LAD atretic, VG->RI->OM nl, VG->PDA 100p.  . Cardiomyopathy with EF 40% at TEE 02/17/14 (likely tachycardia mediated - Myoview 02/19/14 neg for ischemia with normal EF) 02/18/2014  . COPD (chronic obstructive pulmonary disease) (HCC)   . Depression   . Eczema   . GERD (gastroesophageal reflux disease)   . Headache   . HLD (hyperlipidemia)   . Homelessness 11/12/2011  . 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 Hx of CABG   . Hx of transesophageal echocardiography (TEE) for monitoring 11/2010   TEE 11/2010: EF 60-65%, BAE, trivial atrial septal shunt;  right heart cath in 10/2010 with elevated R and L heart pressures and diuretic started  . Hypertension   .  Hypothyroidism   . Persistent atrial fibrillation (HCC) 10/2014  . Pulmonary nodules    repeat CT due in 11/2011  . Sleep apnea    recent sleep study  in 04/2014 per chart review  shows no significant OSA     Family History  Problem Relation Age of Onset  . COPD Mother   . Heart disease Mother   . Arthritis Mother        Rheumatoid and PMR  . Osteoporosis Mother        Mom fractured hip  . Diabetes type II Mother   . Depression Mother   . Heart attack Father   . Depression Father   . Hypertension Father   . Alcohol abuse Father   . Depression Sister   . Anxiety disorder Sister   . Drug abuse Sister   . Stroke Neg Hx      Social History:  reports that she has been smoking cigarettes.  She has a 15.00 pack-year smoking history. She has never used smokeless tobacco. She reports that she does not drink alcohol or use drugs.   Exam: Current vital signs: BP (!) 131/106   Pulse (!) 42   Temp 98.2 F (36.8 C) (Oral)   Resp (!) 24   Ht 5\' 4"  (1.626 m)   Wt 77.7 kg (171 lb 4.8 oz)   SpO2 (!) 89%   BMI 29.40 kg/m  Vital signs in last  24 hours: Temp:  [98 F (36.7 C)-98.2 F (36.8 C)] 98.2 F (36.8 C) (07/10 1230) Pulse Rate:  [25-126] 42 (07/10 1615) Resp:  [15-28] 24 (07/10 1615) BP: (114-241)/(84-180) 131/106 (07/10 1615) SpO2:  [86 %-100 %] 89 % (07/10 1615) Weight:  [77.7 kg (171 lb 4.8 oz)-98.9 kg (218 lb)] 77.7 kg (171 lb 4.8 oz) (07/10 1000)  Physical Exam  Constitutional: Appears well-developed and well-nourished.  Psych: Affect appropriate to situation Eyes: No scleral injection HENT: No OP obstrucion Head: Normocephalic.  Cardiovascular: Normal rate and regular rhythm.  Respiratory: Effort normal and breath sounds normal to anterior ascultation GI: Soft.  No distension. There is no tenderness.  Skin: WDI  Neuro: Mental Status: Patient is awake, alert, oriented to person, place, month, year, and situation. Patient is able to give a clear and coherent  history. No signs of aphasia or neglect Cranial Nerves: II: Visual Fields are full. Pupils are equal, round, and reactive to light.   III,IV, VI: EOMI without ptosis or diploplia.  V: Facial sensation is symmetric to temperature VII: Facial movement with left sided weakness VIII: hearing is intact to voice X: Uvula elevates symmetrically XI: Shoulder shrug is symmetric. XII: tongue is midline without atrophy or fasciculations.  Motor: Tone is normal. Bulk is normal.  5/5 strength was present in the right arm and right leg and left leg, 4+/5 strength in the left arm with slowed movements Sensory: Sensation is symmetric to light touch and temperature in the arms and legs. Cerebellar: Finger-nose finger is slower on the left than the right   I have reviewed labs in epic and the results pertinent to this consultation are: CMP-unremarkable CBC-normal UDS-normal  I have reviewed the images obtained: CT head- borderline elevation in creatinine  Primary Diagnosis:  Intracranial hemorrhage   Secondary Diagnosis: Accelerated hypertension(SBP > 180 or DBP > 11) & end organ damage) and CKD Stage 3 (GFR 30-59), chronic CHF, COPD   Impression: 62 year old female with a history of hypertension, anticoagulation who presents with intraventricular hemorrhage likely secondary to hypertension and anticoagulation.   She has already received factor X a reversal and is being admitted to the ICU for close monitoring and blood pressure control.  Recommendations: 1) Admit to ICU 2) no antiplatelets or anticoagulants 3) blood pressure control with goal systolic 120 - 140 4) Frequent neuro checks 5) If symptoms worsen or there is decreased mental status, repeat stat head CT 6) PT,OT,ST    This patient is critically ill and at significant risk of neurological worsening, death and care requires constant monitoring of vital signs, hemodynamics,respiratory and cardiac monitoring, neurological  assessment, discussion with family, other specialists and medical decision making of high complexity. I spent 50 minutes of neurocritical care time  in the care of  this patient.  Ritta Slot, MD [facility] [phone]  If 7pm- 7am, please page neurology on call as listed in AMION. 03/18/2018  5:17 PM

## 2018-03-18 NOTE — ED Provider Notes (Signed)
[facility] EMERGENCY DEPARTMENT Provider Note   CSN: [id number] Arrival date & time: 03/18/18  0630     History   Chief Complaint Chief Complaint  Patient presents with  . Migraine  . Hypertension    HPI [name] is a 62 y.o. female.  Patient states that she started today with severe headache.  Patient has history of atrial fib and is on Xarelto her last dose was around 6 PM  The history is provided by the patient. No language interpreter was used.  Migraine  This is a recurrent problem. The current episode started 6 to 12 hours ago. The problem occurs constantly. The problem has not changed since onset.Associated symptoms include headaches. Pertinent negatives include no chest pain and no abdominal pain. Nothing aggravates the symptoms. Nothing relieves the symptoms. She has tried nothing for the symptoms. The treatment provided moderate relief.    Past Medical History:  Diagnosis Date  . Acute right MCA stroke (HCC) 11/07/10  . Atrial fibrillation (HCC)    a. s/p TEE-DCCV 02/2104; b. Xarelto started  . CAD (coronary artery disease)    a.  cath 09/2010: LAD stent patent, S-Int/dCFX ok, S-PDA ok, L-LAD atretic;  b. Lexiscan Myoview (02/2014):  no ischemia, EF 55%; c. 11/2014 Cath/PCI: LM nl, LAD 20pISR, LCX 80-4m, OM1 nl, RI 70p, RCA 40-28m, RPDA 95ost/95-38m (PTCA only w/ reduction to 50p/49m), 60d, L->LAD atretic, VG->RI->OM nl, VG->PDA 100p.  . Cardiomyopathy with EF 40% at TEE 02/17/14 (likely tachycardia mediated - Myoview 02/19/14 neg for ischemia with normal EF) 02/18/2014  . COPD (chronic obstructive pulmonary disease) (HCC)   . Depression   . Eczema   . GERD (gastroesophageal reflux disease)   . Headache   . HLD (hyperlipidemia)   . Homelessness 11/12/2011  . 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 Hx of CABG   . Hx of transesophageal echocardiography (TEE) for  monitoring 11/2010   TEE 11/2010: EF 60-65%, BAE, trivial atrial septal shunt;  right heart cath in 10/2010 with elevated R and L heart pressures and diuretic started  . Hypertension   . Hypothyroidism   . Persistent atrial fibrillation (HCC) 10/2014  . Pulmonary nodules    repeat CT due in 11/2011  . Sleep apnea    recent sleep study  in 04/2014 per chart review  shows no significant OSA    Patient Active Problem List   Diagnosis Date Noted  . 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 Long term current use of amiodarone 03/27/2017  . Severe recurrent major depression without psychotic features (HCC) 11/19/2016    Class: Chronic  . Pre-procedure lab exam 09/13/2016  . Generalized headaches 07/04/2016  . Protein-calorie malnutrition, severe (HCC) 04/03/2015  . Chronic diastolic CHF (congestive heart failure), NYHA class 2 (HCC) 01/24/2015  . Demand ischemia secondary to AF with RVR 12/13/2014  . CKD (chronic kidney disease), stage III (HCC) 11/11/2014  . Hypokalemia 05/22/2014  . Mood disorder (HCC) 03/20/2014  . HTN (hypertension) 03/20/2014  . Anemia, unspecified 03/20/2014  . Tobacco use disorder 03/20/2014  . Atrial fibrillation with RVR (HCC) 03/03/2014  . Hypotension 03/03/2014  . Pulmonary hypertension (HCC) 03/03/2014  . COPD (chronic obstructive pulmonary disease) (HCC) 03/03/2014  . History of right MCA stroke   . Long-term (current) use of anticoagulants   . Cardiomyopathy-h/o tachycardia mediated-EF 65% per echo  March 2016 02/18/2014  . Hyperlipidemia 02/18/2014  . CAD '07, LAD PCI 2012, SVG-PDA PTCA 11/10/14 02/16/2014  . Chronic diastolic heart failure (HCC)   . Pulmonary nodules   . Hypothyroidism   . OSA- C-pap intol     Past Surgical History:  Procedure Laterality Date  . bilateral knee surgery    . BLADDER SURGERY    . CARDIOVERSION N/A 02/17/2014   Procedure: CARDIOVERSION;  Surgeon: Pricilla Riffle, MD;  Location: [facility] ENDOSCOPY;  Service: Cardiovascular;   Laterality: N/A;  . CARDIOVERSION N/A 09/20/2016   Procedure: CARDIOVERSION;  Surgeon: Lars Masson, MD;  Location: [facility] ENDOSCOPY;  Service: Cardiovascular;  Laterality: N/A;  . cath 2012    . CHEST WALL RECONSTRUCTION    . CORONARY ARTERY BYPASS GRAFT    . debriment for infection in chest    . HERNIA REPAIR    . LEFT AND RIGHT HEART CATHETERIZATION WITH CORONARY ANGIOGRAM N/A 11/10/2014   Procedure: LEFT AND RIGHT HEART CATHETERIZATION WITH CORONARY ANGIOGRAM;  Surgeon: Marykay Lex, MD;  Location: [facility] CATH LAB;  Service: Cardiovascular;  Laterality: N/A;  . Left mastoidectomy    . TEE WITHOUT CARDIOVERSION N/A 02/17/2014   Procedure: TRANSESOPHAGEAL ECHOCARDIOGRAM (TEE);  Surgeon: Pricilla Riffle, MD;  Location: [facility] ENDOSCOPY;  Service: Cardiovascular;  Laterality: N/A;     OB History   None      Home Medications    Prior to Admission medications   Medication Sig Start Date End Date Taking? Authorizing Provider  atorvastatin (LIPITOR) 20 MG tablet Take 1 tablet (20 mg total) by mouth daily. 03/27/17   Lars Masson, MD  buPROPion (WELLBUTRIN XL) 300 MG 24 hr tablet Take 1 tablet (300 mg total) by mouth every morning. 10/29/17   Archer Asa, MD  clopidogrel (PLAVIX) 75 MG tablet Take 1 tablet (75 mg total) by mouth every morning. 03/27/17   Lars Masson, MD  escitalopram (LEXAPRO) 10 MG tablet Take 1 tablet (10 mg total) by mouth daily. 10/29/17 10/29/18  Plovsky, Earvin Hansen, MD  fluticasone (FLONASE) 50 MCG/ACT nasal spray Place 2 sprays into both nostrils daily. 08/15/16   Panosh, Neta Mends, MD  levothyroxine (SYNTHROID) 100 MCG tablet Take 1 tablet (100 mcg total) by mouth daily before breakfast. 03/28/17   Lars Masson, MD  magnesium oxide (MAG-OX) 400 MG tablet Take 1 tablet (400 mg total) by mouth daily. 09/17/16   Lars Masson, MD  nitroGLYCERIN (NITROSTAT) 0.4 MG SL tablet Place 1 tablet (0.4 mg total) under the tongue every 5 (five) minutes as needed for chest  pain. 01/24/15   Lars Masson, MD  ondansetron (ZOFRAN-ODT) 4 MG disintegrating tablet DISSOLVE ONE TABLET IN MOUTH EVERY 8 HOURS AS NEEDED FOR NAUSEA 10/14/17   Panosh, Neta Mends, MD  potassium chloride SA (K-DUR,KLOR-CON) 20 MEQ tablet TAKE 1 TABLET BY MOUTH 3  TIMES DAILY 10/04/16   Lars Masson, MD  PROAIR HFA 108 [id number] Base) MCG/ACT inhaler INHALE 2 PUFFS BY MOUTH EVERY 6 HOURS AS NEEDED FOR SHORTNESS OF BREATH 02/03/18   Leslye Peer, MD  ranitidine (ZANTAC) 150 MG tablet Take 1 tablet (150 mg total) by mouth 2 (two) times daily. 04/16/17   Panosh, Neta Mends, MD  rivaroxaban (XARELTO) 20 MG TABS tablet TAKE ONE TABLET BY MOUTH ONCE DAILY WITH  SUPPER 01/30/17   Lars Masson, MD  Tiotropium Bromide Monohydrate (SPIRIVA RESPIMAT) 2.5 MCG/ACT AERS Inhale 1 puff into the lungs daily. 10/02/16   Delton See,  Faustino Congress, MD  topiramate (TOPAMAX) 50 MG tablet Take 1 tablet (50 mg total) by mouth 2 (two) times daily. 08/16/16   Micki Riley, MD  topiramate (TOPAMAX) 50 MG tablet Take 1 tablet (50 mg total) by mouth 2 (two) times daily. 10/13/17   Micki Riley, MD  XARELTO 20 MG TABS tablet TAKE 1 TABLET BY MOUTH ONCE DAILY WITH  SUPPER 01/28/18   Lars Masson, MD    Family History Family History  Problem Relation Age of Onset  . COPD Mother   . Heart disease Mother   . Arthritis Mother        Rheumatoid and PMR  . Osteoporosis Mother        Mom fractured hip  . Diabetes type II Mother   . Depression Mother   . Heart attack Father   . Depression Father   . Hypertension Father   . Alcohol abuse Father   . Depression Sister   . Anxiety disorder Sister   . Drug abuse Sister   . Stroke Neg Hx     Social History Social History   Tobacco Use  . Smoking status: Current Every Day Smoker    Packs/day: 0.50    Years: 30.00    Pack years: 15.00    Types: Cigarettes    Last attempt to quit: 09/10/2014    Years since quitting: 3.5  . Smokeless tobacco: Never Used  Substance Use  Topics  . Alcohol use: No    Alcohol/week: 0.0 oz  . Drug use: No     Allergies   Avelox [moxifloxacin hcl in nacl]; Pamelor [nortriptyline hcl]; Amoxicillin; Hydrocodone; Oxycodone; Penicillins; and Sulfa antibiotics   Review of Systems Review of Systems  Constitutional: Negative for appetite change and fatigue.  HENT: Negative for congestion, ear discharge and sinus pressure.   Eyes: Negative for discharge.  Respiratory: Negative for cough.   Cardiovascular: Negative for chest pain.  Gastrointestinal: Negative for abdominal pain and diarrhea.  Genitourinary: Negative for frequency and hematuria.  Musculoskeletal: Negative for back pain.  Skin: Negative for rash.  Neurological: Positive for headaches. Negative for seizures.  Psychiatric/Behavioral: Negative for hallucinations.     Physical Exam Updated Vital Signs BP (!) 131/104   Pulse 72   Temp 98 F (36.7 C) (Oral)   Resp 15   Ht 5\' 3"  (1.6 m)   Wt 98.9 kg (218 lb)   SpO2 98%   BMI 38.62 kg/m   Physical Exam  Constitutional: She is oriented to person, place, and time. She appears well-developed.  HENT:  Head: Normocephalic.  Eyes: Conjunctivae and EOM are normal. No scleral icterus.  Neck: Neck supple. No thyromegaly present.  Cardiovascular: Normal rate and regular rhythm. Exam reveals no gallop and no friction rub.  No murmur heard. Pulmonary/Chest: No stridor. She has no wheezes. She has no rales. She exhibits no tenderness.  Abdominal: She exhibits no distension. There is no tenderness. There is no rebound.  Musculoskeletal: Normal range of motion. She exhibits no edema.  Lymphadenopathy:    She has no cervical adenopathy.  Neurological: She is oriented to person, place, and time. She exhibits normal muscle tone. Coordination normal.  Mild slurred speech and left facial drooping.  Skin: No rash noted. No erythema.  Psychiatric: She has a normal mood and affect. Her behavior is normal.     ED  Treatments / Results  Labs (all labs ordered are listed, but only abnormal results are displayed) Labs Reviewed  COMPREHENSIVE METABOLIC PANEL - Abnormal; Notable for the following components:      Result Value   CO2 21 (*)    Glucose, Bld 101 (*)    Creatinine, Ser 1.20 (*)    Calcium 8.6 (*)    Total Bilirubin 1.4 (*)    GFR calc non Af Amer 48 (*)    GFR calc Af Amer 56 (*)    All other components within normal limits  URINALYSIS, ROUTINE W REFLEX MICROSCOPIC - Abnormal; Notable for the following components:   Protein, ur 100 (*)    All other components within normal limits  CBC WITH DIFFERENTIAL/PLATELET  RAPID URINE DRUG SCREEN, HOSP PERFORMED  HIV ANTIBODY (ROUTINE TESTING)  I-STAT TROPONIN, ED    EKG None  Radiology Ct Head Wo Contrast  Result Date: 03/18/2018 CLINICAL DATA:  Headache and lethargy. EXAM: CT HEAD WITHOUT CONTRAST TECHNIQUE: Contiguous axial images were obtained from the base of the skull through the vertex without intravenous contrast. COMPARISON:  March 18, 2015 FINDINGS: Brain: There is mild diffuse atrophy, stable. Hemorrhage fills the left lateral ventricle and much of the third lateral ventricle. A small amount of hemorrhage extends into the posterior aspect of the frontal horn of the right lateral ventricle. This hemorrhage is causing slight dilatation of the left lateral ventricle. No hemorrhage is evident outside of the ventricular system currently. There is 3 mm of midline shift toward the right. There is evidence of a prior infarct involving portions of the posterior right frontal and superior right temporal lobe with infarct involving much of the right lentiform nucleus as well as the right claustrum, extreme capsule, and insular cortex. Elsewhere there is patchy small vessel disease in the centra semiovale bilaterally. No well-defined acute infarct evident. No subdural or epidural fluid collections are evident. No mass evident. There is stable prominence  of the cavernous sinus region on the right compared to the left, stable from 2016. Vascular: There is no hyperdense vessel. There is calcification in each carotid siphon. Skull: Bony calvarium appears intact. Sinuses/Orbits: There is mucosal thickening in multiple ethmoid air cells. Orbits appear symmetric bilaterally. Other: Patient has had previous mastoidectomy on the left. There is opacification of the remaining mastoid air cells on the left. There is opacification in multiple mastoid air cells on the right. There is a sebaceous cyst posterior to the midline occipital bones measuring 8 x 7 mm. IMPRESSION: Focal hemorrhage filling most of the left lateral and third ventricles with mild extension into the posterior aspect of the frontal horn of the right lateral ventricle. There is slight dilatation of the left lateral ventricle. There is 3 mm of midline shift toward the right. No hemorrhage is seen within the brain parenchyma on this study. There is atrophy. There is prior infarct involving portions of the posterior right frontal and superior anterior right temporal lobes as well as much of the right claustrum, extreme capsule, and insular cortex. Much of the right lentiform nucleus is involved with this infarct as well. There is patchy periventricular small vessel disease elsewhere without evident acute infarct. There are foci of arterial vascular calcification. There is mucosal thickening in multiple ethmoid air cells. There is extensive mastoid disease bilaterally with evidence of previous mastoidectomy on the left. Critical Value/emergent results were called by telephone at the time of interpretation on 03/18/2018 at 7:47 am to Dr. Bethann Berkshire , who verbally acknowledged these results. Electronically Signed   By: Bretta Bang III M.D.   On: 03/18/2018 07:47  Dg Chest Portable 1 View  Result Date: 03/18/2018 CLINICAL DATA:  Atrial fibrillation. EXAM: PORTABLE CHEST 1 VIEW COMPARISON:  Jan 08, 2017  FINDINGS: There is no edema or consolidation. Heart is upper normal in size with pulmonary vascularity normal. Patient is status post coronary artery bypass grafting. There are surgical clips over the lateral upper right hemithorax. There is aortic atherosclerosis. No evident bone lesions. IMPRESSION: No edema or consolidation. Stable cardiac silhouette. Status post coronary artery bypass grafting. There is aortic atherosclerosis. Aortic Atherosclerosis (ICD10-I70.0). Electronically Signed   By: Bretta Bang III M.D.   On: 03/18/2018 07:14    Procedures Procedures (including critical care time)  Medications Ordered in ED Medications  diltiazem (CARDIZEM) 1 mg/mL load via infusion 10 mg (10 mg Intravenous Bolus from Bag 03/18/18 0713)    And  diltiazem (CARDIZEM) 100 mg in dextrose 5% (1 mg/mL) infusion (0 mg/hr Intravenous Paused 03/18/18 0813)   stroke: mapping our early stages of recovery book (has no administration in time range)  acetaminophen (TYLENOL) tablet 650 mg (has no administration in time range)    Or  acetaminophen (TYLENOL) solution 650 mg (has no administration in time range)    Or  acetaminophen (TYLENOL) suppository 650 mg (has no administration in time range)  senna-docusate (Senokot-S) tablet 1 tablet (has no administration in time range)  pantoprazole (PROTONIX) EC tablet 40 mg (has no administration in time range)  HYDROmorphone (DILAUDID) injection 1 mg (1 mg Intravenous Given 03/18/18 0746)  ondansetron (ZOFRAN) injection 4 mg (4 mg Intravenous Given 03/18/18 0743)  labetalol (NORMODYNE,TRANDATE) injection 20 mg (20 mg Intravenous Given 03/18/18 0800)  coag fact Xa recombinant (ANDEXXA) infusion 900 mg (900 mg Intravenous New Bag/Given 03/18/18 0843)  0.9 %  sodium chloride infusion ( Intravenous Paused 03/18/18 0848)     Initial Impression / Assessment and Plan / ED Course  I have reviewed the triage vital signs and the nursing notes.  Pertinent labs &  imaging results that were available during my care of the patient were reviewed by me and considered in my medical decision making (see chart for details).  Clinical Course as of Mar 19 919  Wed Mar 18, 2018  0919 CT Head Wo Contrast [MB]    Clinical Course User Index [MB] Vergia Alberts, Student-PA   CRITICAL CARE Performed by: Bethann Berkshire Total critical care time: 45 minutes Critical care time was exclusive of separately billable procedures and treating other patients. Critical care was necessary to treat or prevent imminent or life-threatening deterioration. Critical care was time spent personally by me on the following activities: development of treatment plan with patient and/or surrogate as well as nursing, discussions with consultants, evaluation of patient's response to treatment, examination of patient, obtaining history from patient or surrogate, ordering and performing treatments and interventions, ordering and review of laboratory studies, ordering and review of radiographic studies, pulse oximetry and re-evaluation of patient's condition. CT scan shows cerebral hemorrhage.  Neurosurgery was contacted and they reviewed the CT scan and stated that the patient should be admitted to neurology.  Neurology was consulted and has admitted the patient.  She was given andexxa and also was given labetalol to control her blood pressure.   Final Clinical Impressions(s) / ED Diagnoses   Final diagnoses:  None    ED Discharge Orders    None       Bethann Berkshire, MD 03/18/18 [phone]

## 2018-03-19 ENCOUNTER — Inpatient Hospital Stay (HOSPITAL_COMMUNITY): Payer: PPO

## 2018-03-19 ENCOUNTER — Other Ambulatory Visit: Payer: Self-pay

## 2018-03-19 DIAGNOSIS — I10 Essential (primary) hypertension: Secondary | ICD-10-CM

## 2018-03-19 DIAGNOSIS — I214 Non-ST elevation (NSTEMI) myocardial infarction: Secondary | ICD-10-CM

## 2018-03-19 DIAGNOSIS — I25118 Atherosclerotic heart disease of native coronary artery with other forms of angina pectoris: Secondary | ICD-10-CM

## 2018-03-19 DIAGNOSIS — Z7901 Long term (current) use of anticoagulants: Secondary | ICD-10-CM

## 2018-03-19 DIAGNOSIS — Z5181 Encounter for therapeutic drug level monitoring: Secondary | ICD-10-CM

## 2018-03-19 DIAGNOSIS — I615 Nontraumatic intracerebral hemorrhage, intraventricular: Principal | ICD-10-CM

## 2018-03-19 DIAGNOSIS — Z5309 Procedure and treatment not carried out because of other contraindication: Secondary | ICD-10-CM

## 2018-03-19 DIAGNOSIS — I610 Nontraumatic intracerebral hemorrhage in hemisphere, subcortical: Secondary | ICD-10-CM

## 2018-03-19 DIAGNOSIS — I4891 Unspecified atrial fibrillation: Secondary | ICD-10-CM

## 2018-03-19 DIAGNOSIS — I5043 Acute on chronic combined systolic (congestive) and diastolic (congestive) heart failure: Secondary | ICD-10-CM

## 2018-03-19 LAB — HIV ANTIBODY (ROUTINE TESTING W REFLEX): HIV Screen 4th Generation wRfx: NONREACTIVE

## 2018-03-19 LAB — ECHOCARDIOGRAM COMPLETE
Height: 64 in
Weight: 2740.76 [oz_av]

## 2018-03-19 MED ORDER — ESCITALOPRAM OXALATE 10 MG PO TABS
10.0000 mg | ORAL_TABLET | Freq: Every day | ORAL | Status: DC
  Administered 2018-03-19 – 2018-03-24 (×6): 10 mg via ORAL
  Filled 2018-03-19 (×6): qty 1

## 2018-03-19 MED ORDER — TOPIRAMATE 25 MG PO TABS
50.0000 mg | ORAL_TABLET | Freq: Two times a day (BID) | ORAL | Status: DC
  Administered 2018-03-19 – 2018-03-24 (×12): 50 mg via ORAL
  Filled 2018-03-19 (×12): qty 2

## 2018-03-19 MED ORDER — BUPROPION HCL ER (XL) 150 MG PO TB24
300.0000 mg | ORAL_TABLET | Freq: Every morning | ORAL | Status: DC
  Administered 2018-03-19 – 2018-03-24 (×6): 300 mg via ORAL
  Filled 2018-03-19 (×2): qty 2
  Filled 2018-03-19: qty 1
  Filled 2018-03-19 (×2): qty 2
  Filled 2018-03-19: qty 1

## 2018-03-19 MED ORDER — FUROSEMIDE 10 MG/ML IJ SOLN
20.0000 mg | Freq: Two times a day (BID) | INTRAMUSCULAR | Status: DC
  Administered 2018-03-19: 20 mg via INTRAVENOUS
  Filled 2018-03-19: qty 2

## 2018-03-19 MED ORDER — FLUTICASONE PROPIONATE 50 MCG/ACT NA SUSP
2.0000 | Freq: Every day | NASAL | Status: DC
  Administered 2018-03-19 – 2018-03-24 (×5): 2 via NASAL
  Filled 2018-03-19: qty 16

## 2018-03-19 MED ORDER — LEVOTHYROXINE SODIUM 100 MCG PO TABS
100.0000 ug | ORAL_TABLET | Freq: Every day | ORAL | Status: DC
  Administered 2018-03-19 – 2018-03-21 (×3): 100 ug via ORAL
  Filled 2018-03-19 (×3): qty 1

## 2018-03-19 MED ORDER — FAMOTIDINE 20 MG PO TABS
20.0000 mg | ORAL_TABLET | Freq: Every day | ORAL | Status: DC
  Administered 2018-03-20 – 2018-03-24 (×5): 20 mg via ORAL
  Filled 2018-03-19 (×6): qty 1

## 2018-03-19 MED ORDER — MAGNESIUM OXIDE 400 (241.3 MG) MG PO TABS
400.0000 mg | ORAL_TABLET | Freq: Every day | ORAL | Status: DC
  Administered 2018-03-19 – 2018-03-24 (×6): 400 mg via ORAL
  Filled 2018-03-19 (×6): qty 1

## 2018-03-19 MED ORDER — AMLODIPINE BESYLATE 5 MG PO TABS
5.0000 mg | ORAL_TABLET | Freq: Every day | ORAL | Status: DC
  Administered 2018-03-19: 5 mg via ORAL
  Filled 2018-03-19: qty 1

## 2018-03-19 MED ORDER — DILTIAZEM HCL-DEXTROSE 100-5 MG/100ML-% IV SOLN (PREMIX)
5.0000 mg/h | INTRAVENOUS | Status: DC
  Filled 2018-03-19: qty 100

## 2018-03-19 NOTE — Progress Notes (Signed)
Visited with pt per [facility] to assist with AD.  Patient did not ask for AD.  Patient said she was confused right now and did know what I was talking about.  Will follow as needed.  Venida Jarvis, [place], [facility], Pager [phone]

## 2018-03-19 NOTE — Progress Notes (Addendum)
STROKE TEAM PROGRESS NOTE  HPI:( Dr Amada Jupiter )  [name] is a 62 y.o. female with a history of atrial fibrillation, hypertension who presents with intraventricular hemorrhage.  She states that she started having a headache yesterday evening.She denies diplopia, numbness, weakness, but does endorse some unsteadiness when she was walking.  She has a left sided facial droop and left-sided weakness that is been baseline since the stroke 8 years ago. LKW: 03/17/18, evening tpa given?: no, ICH ICH Score: 0 Modified Rankin Scale: 0-   INTERVAL HISTORY No family is at the bedside.  Overall, she is feeling better.Neurologically almost back to baseline Remains on Cleviprex drip for BP control. Ok to get OOB. Wean cleviprex. Keep in ICU over night.   Vitals:   03/19/18 0700 03/19/18 0712 03/19/18 0800 03/19/18 0830  BP: 98/73 119/81  123/89  Pulse: 91 (!) 107  94  Resp: (!) 23 (!) 22  15  Temp:   98.2 F (36.8 C)   TempSrc:   Oral   SpO2: 95% 96%  94%  Weight:      Height:        CBC:  Recent Labs  Lab 03/18/18 0658  WBC 8.3  NEUTROABS 6.7  HGB 14.3  HCT 42.2  MCV 98.1  PLT 296    Basic Metabolic Panel:  Recent Labs  Lab 03/18/18 0658  NA 136  K 4.1  CL 104  CO2 21*  GLUCOSE 101*  BUN 12  CREATININE 1.20*  CALCIUM 8.6*   Lipid Panel:     Component Value Date/Time   CHOL 188 10/08/2016 0803   TRIG 83.0 10/08/2016 0803   HDL 65.60 10/08/2016 0803   CHOLHDL 3 10/08/2016 0803   VLDL 16.6 10/08/2016 0803   LDLCALC 106 (H) 10/08/2016 0803   HgbA1c:  Lab Results  Component Value Date   HGBA1C  02/11/2011    5.4 (NOTE)                                                                       According to the ADA Clinical Practice Recommendations for 2011, when HbA1c is used as a screening test:   >=6.5%   Diagnostic of Diabetes Mellitus           (if abnormal result  is confirmed)  5.7-6.4%   Increased risk of developing Diabetes Mellitus  References:Diagnosis and  Classification of Diabetes Mellitus,Diabetes Care,2011,34(Suppl 1):S62-S69 and Standards of Medical Care in         Diabetes - 2011,Diabetes Care,2011,34  (Suppl 1):S11-S61.   Urine Drug Screen:     Component Value Date/Time   LABOPIA NONE DETECTED 03/18/2018 0840   COCAINSCRNUR NONE DETECTED 03/18/2018 0840   LABBENZ NONE DETECTED 03/18/2018 0840   AMPHETMU NONE DETECTED 03/18/2018 0840   THCU NONE DETECTED 03/18/2018 0840   LABBARB (A) 03/18/2018 0840    Result not available. Reagent lot number recalled by manufacturer.    Alcohol Level No results found for: [facility]  IMAGING Ct Head Wo Contrast  Result Date: 03/19/2018 CLINICAL DATA:  Follow up stroke. EXAM: CT HEAD WITHOUT CONTRAST TECHNIQUE: Contiguous axial images were obtained from the base of the skull through the vertex without intravenous contrast. COMPARISON:  CT HEAD March 18, 2018. FINDINGS: BRAIN: Similar intraventricular hemorrhage predominately within LEFT lateral ventricle. Moderate ventriculomegaly in the basis of parenchymal brain volume loss. No hydrocephalus. No intraparenchymal hemorrhage or acute large vascular territory infarct. Old RIGHT basal ganglia, frontotemporal infarcts. Ex vacuo dilatation RIGHT frontal horn of the lateral ventricle. Patchy supratentorial white matter hypodensities exclusively for mention abnormality. No abnormal extra-axial fluid collections. Basal cisterns are patent. VASCULAR: Mild calcific atherosclerosis of the carotid siphons. SKULL: No skull fracture. LEFT parietal scalp scarring. Numerous subcentimeter calcified scalp tricholemmal cysts. SINUSES/ORBITS: LEFT wall up mastoidectomy. Small amount of soft tissue within the surgical bed. Small RIGHT mastoid effusion.The included ocular globes and orbital contents are non-suspicious. OTHER: None. IMPRESSION: 1. Similar intraventricular hemorrhage.  No hydrocephalus. 2. Old RIGHT MCA territory infarct. 3. Mild chronic small vessel ischemic changes.  Electronically Signed   By: Awilda Metro M.D.   On: 03/19/2018 05:31   Ct Head Wo Contrast  Result Date: 03/18/2018 CLINICAL DATA:  Headache and lethargy. EXAM: CT HEAD WITHOUT CONTRAST TECHNIQUE: Contiguous axial images were obtained from the base of the skull through the vertex without intravenous contrast. COMPARISON:  March 18, 2015 FINDINGS: Brain: There is mild diffuse atrophy, stable. Hemorrhage fills the left lateral ventricle and much of the third lateral ventricle. A small amount of hemorrhage extends into the posterior aspect of the frontal horn of the right lateral ventricle. This hemorrhage is causing slight dilatation of the left lateral ventricle. No hemorrhage is evident outside of the ventricular system currently. There is 3 mm of midline shift toward the right. There is evidence of a prior infarct involving portions of the posterior right frontal and superior right temporal lobe with infarct involving much of the right lentiform nucleus as well as the right claustrum, extreme capsule, and insular cortex. Elsewhere there is patchy small vessel disease in the centra semiovale bilaterally. No well-defined acute infarct evident. No subdural or epidural fluid collections are evident. No mass evident. There is stable prominence of the cavernous sinus region on the right compared to the left, stable from 2016. Vascular: There is no hyperdense vessel. There is calcification in each carotid siphon. Skull: Bony calvarium appears intact. Sinuses/Orbits: There is mucosal thickening in multiple ethmoid air cells. Orbits appear symmetric bilaterally. Other: Patient has had previous mastoidectomy on the left. There is opacification of the remaining mastoid air cells on the left. There is opacification in multiple mastoid air cells on the right. There is a sebaceous cyst posterior to the midline occipital bones measuring 8 x 7 mm. IMPRESSION: Focal hemorrhage filling most of the left lateral and third  ventricles with mild extension into the posterior aspect of the frontal horn of the right lateral ventricle. There is slight dilatation of the left lateral ventricle. There is 3 mm of midline shift toward the right. No hemorrhage is seen within the brain parenchyma on this study. There is atrophy. There is prior infarct involving portions of the posterior right frontal and superior anterior right temporal lobes as well as much of the right claustrum, extreme capsule, and insular cortex. Much of the right lentiform nucleus is involved with this infarct as well. There is patchy periventricular small vessel disease elsewhere without evident acute infarct. There are foci of arterial vascular calcification. There is mucosal thickening in multiple ethmoid air cells. There is extensive mastoid disease bilaterally with evidence of previous mastoidectomy on the left. Critical Value/emergent results were called by telephone at the time of interpretation on 03/18/2018 at 7:47 am to Dr.  Bethann Berkshire , who verbally acknowledged these results. Electronically Signed   By: Bretta Bang III M.D.   On: 03/18/2018 07:47   Dg Chest Portable 1 View  Result Date: 03/18/2018 CLINICAL DATA:  Atrial fibrillation. EXAM: PORTABLE CHEST 1 VIEW COMPARISON:  Jan 08, 2017 FINDINGS: There is no edema or consolidation. Heart is upper normal in size with pulmonary vascularity normal. Patient is status post coronary artery bypass grafting. There are surgical clips over the lateral upper right hemithorax. There is aortic atherosclerosis. No evident bone lesions. IMPRESSION: No edema or consolidation. Stable cardiac silhouette. Status post coronary artery bypass grafting. There is aortic atherosclerosis. Aortic Atherosclerosis (ICD10-I70.0). Electronically Signed   By: Bretta Bang III M.D.   On: 03/18/2018 07:14    PHYSICAL EXAM Pleasant middle-aged Caucasian lady currently not in distress. . Afebrile. Head is nontraumatic. Neck is  supple without bruit.    Cardiac exam no murmur or gallop. Lungs are clear to auscultation. Distal pulses are well felt. Neurological Exam : Awake alert oriented x 3 normal speech and language. Mild left lower face asymmetry. Tongue midline. No drift. Mild diminished fine finger movements on left. Orbits right over left upper extremity. Mild left grip weak..mild left hip flexor weakness Normal sensation . Normal coordination.  ASSESSMENT/PLAN Ms. CIENNA DUMAIS is a 62 y.o. female with history of AF on Xarelto, HTN, CAD s/p CABG, HLD, prior stroke, tobacco use presenting with HA.    Stroke:  IVH secondary to HTN and Xarelto coagulopathy  xarelto reversed with Andexxa   CT head L lateral, 3rd ventricle with extension R frontal horn IVH, 3mm midline shift. No IPH. Atrophy. Old R frontal/temporal/lentiform nucleus infarcts. Small vessel disease. Arterial vascular calcification. Ethmoid mucosal thickening. Extensive mastoid dz s/p OR.  Repeat CT head stable, old R MCA infarct. Small vessel disease.   MRI with and without contrast  pending   2D Echo  .pending   SCDs for VTE prophylaxis  clopidogrel 75 mg daily and Xarelto (rivaroxaban) daily (pt questions if she was taking plavix, she is not sure) prior to admission, now on No antithrombotic given IVH. Stop xarelto. Stop plavix. Add aspirin once stable; likely in 5-7 days.   Therapy recommendations:  pending  Disposition:  pending   Ok to be OOB  Keep in ICU tonight  Atrial Fibrillation  Home anticoagulation:  Xarelto (rivaroxaban) daily   xarelto reversed with Andexxa  . Stop xarelto . Add low dose aspirin once hemorrhage stable - p5ob 5-7 days  Hypertensive Emergency  BP 199/120 on arrival  Treated with Cleviprex  SBP goal < 140 for at least 24h (up at 12noon today)  BP within range, Stable . Home Meds: none . Add amlodipine 5 mg daily  . Long-term BP goal normotensive  Hyperlipidemia  Home meds:  Lipitor 20  LDL  not checked  Will not resume statin in hospital given hemorrhage  Continue statin at discharge  Other Stroke Risk Factors  Cigarette smoker, advised to stop smoking  Hx stroke/TIA  11/2010 - R MCA infarct with residual mild L HP, embolic. TEE neg  02/2011 - TIA w/ transient dysarthria d/t decreased perfusion from bradycardia and hypotension  Coronary artery disease s/p CABG, stents - on plavix due to CAD  Chronic HA, on topamax. Followed by Dr. Pearlean Brownie  obstructive sleep apnea   Other Active Problems  Hx tremor, now resolved. saw neurologist in [facility] day # 1  Annie Main, MSN, APRN, ANVP-BC, AGPCNP-BC Advanced Practice  Stroke Nurse [facility] See Amion for Schedule & Pager information 03/19/2018 9:22 AM  I have personally examined this patient, reviewed notes, independently viewed imaging studies, participated in medical decision making and plan of care.ROS completed by me personally and pertinent positives fully documented  I have made any additions or clarifications directly to the above note. Agree with note above.  Continue close neurological monitoring and strict blood pressure control. She is at risk for hematoma expansion, hydrocephalus. Resume home medications. She will need to be off anticoagulation and will start aspirin after 1 week. Long discussion the patient regarding her presentation, treatment plan and answered questions. Check MRI scan of the brain tomorrow.This patient is critically ill and at significant risk of neurological worsening, death and care requires constant monitoring of vital signs, hemodynamics,respiratory and cardiac monitoring, extensive review of multiple databases, frequent neurological assessment, discussion with family, other specialists and medical decision making of high complexity.I have made any additions or clarifications directly to the above note.This critical care time does not reflect procedure time, or teaching time or  supervisory time of PA/NP/Med Resident etc but could involve care discussion time.  I spent 35 minutes of neurocritical care time  in the care of  this patient.      Delia Heady, MD Medical Director [facility] Pager: [phone] 03/19/2018 1:42 PM  To contact Stroke Continuity provider, please refer to [email or web address]. After hours, contact General Neurology

## 2018-03-19 NOTE — Progress Notes (Signed)
PT w/ known AF, now in RVR w/ HR 120s. RN reports pt needed to go to the [facility], may be anxious, consider anti-anxiety med.  Not on rate control med at home. Confirmed HR remains elevated from previously done EKG which showed AF w/ RVR. Will add cardizem drip and ask cardiology to evaluate and treat.  Followed by Dr. Delton See.   Discussed w/ Dr. Pearlean Brownie.  Annie Main, MSN, APRN, ANVP-BC, AGPCNP-BC Advanced Practice Stroke Nurse [facility] See Amion for Schedule & Pager information 03/19/2018 2:16 PM

## 2018-03-19 NOTE — Consult Note (Addendum)
Cardiology Consultation:   Patient ID: [name]; [id number]; 1956/07/31   Admit date: 03/18/2018 Date of Consult: 03/19/2018  Primary Care Provider: Madelin Headings, MD Primary Cardiologist: Dr. Tobias Alexander, MD  Patient Profile:   [name] is a 62 y.o. female with a hx of CAD s/p CABG and subsequent stenting of the native LAD (LIMA-LAD found to be atretic), PAF (on amiodarone and Xarelto), chronic combined systolic and diastolic HF (EF 40% in 2015 >> improved to 65-70% in 3/16), HTN, HL, CKD, OSA, CVA in 2012, COPD, bradycardia who is being seen today for the evaluation of atrial fibrillation with RVR at the request of Dr. Pearlean Brownie.  History of Present Illness:   [name] is a 62 year old female with a history stated above who presented to [facility] on 03/18/2018 with complaints of severe headache x1 day prior to admission. Given her history of prior stroke and persistently severe headache, patient reported to [facility] for further evaluation.   In the ED, a CT of her head was performed which revealed focal hemorrhage filling most of the left lateral and third ventricles with 3 mm of midline shift towards the right. Given these results, neurosurgery was consulted for possible surgical intervention. Given her reassuring neurological exam, it was felt that she did not need neurosurgical intervention nor a ventricular drain. She has chronic left-sided facial droop with left-sided weakness secondary to prior stroke 8 years ago. She was given reversal agent Andexxa.   Cardiology has been consulted for atrial fibrillation with RVR with heart rates ranging from the 100s to the 130s.  She has baseline shortness of breath however reports that this becomes worse with her atrial fibrillation. She denies recent chest pain, palpitations, orthopnea, dizziness or syncope.  She states that over the last several months she has noted more fatigue and shortness of breath with exertion although her baseline is  quite sedentary.  She reports ongoing tobacco use, no alcohol or illicit drug use.  She reports compliance with her medications including Plavix and Xarelto.  [name] was initially evaluated by Dr. Delton See in 11/2014 for progressively worsening DOE.  A R/L heart catheterization was performed which demonstrated a patent LAD stent, SVG-RI/OM patent, SVG-PDA occluded (new finding), severe distal PDA disease treated with balloon angioplasty only.  Because she is on chronic Xarelto therapy, she was discharged on aspirin, Plavix and Xarelto.  She was then readmitted soon after with atrial fibrillation with RVR.  She had run out of her amiodarone 4 days prior to admission.  He K+ at that time was noted to be less than 3.0 in which this was replaced and she returned to NSR and transitioned back on oral amiodarone. Her hospitalization was complicated by hypotension, as well as worsening renal insufficiency felt to be related to hypoperfusion from AF with RVR. Her troponins were mildly elevated to be secondary to demand ischemia.  Patient is followed by Dr. Delton See and was last seen on 03/27/2017 for six-month follow-up of PAF and CAD. She had seen Dr. Johney Frame in 09/2016 in which it was noted that she was not interested in AF ablation or PPM placement for chronotropic incompetence.  At that time, she stated that she felt okay and denied palpitations, dizziness or syncope.  She reported compliance with her medications and had no side effects.  She had no complaints of chest pain, DOE however appeared mildly short of breath at rest however patient denied. She was in normal sinus rhythm at that time.  Past Medical History:  Diagnosis Date  . Acute right MCA stroke (HCC) 11/07/10  . Atrial fibrillation (HCC)    a. s/p TEE-DCCV 02/2104; b. Xarelto started  . CAD (coronary artery disease)    a.  cath 09/2010: LAD stent patent, S-Int/dCFX ok, S-PDA ok, L-LAD atretic;  b. Lexiscan Myoview (02/2014):  no ischemia, EF 55%; c.  11/2014 Cath/PCI: LM nl, LAD 20pISR, LCX 80-27m, OM1 nl, RI 70p, RCA 40-54m, RPDA 95ost/95-3m (PTCA only w/ reduction to 50p/31m), 60d, L->LAD atretic, VG->RI->OM nl, VG->PDA 100p.  . Cardiomyopathy with EF 40% at TEE 02/17/14 (likely tachycardia mediated - Myoview 02/19/14 neg for ischemia with normal EF) 02/18/2014  . COPD (chronic obstructive pulmonary disease) (HCC)   . Depression   . Eczema   . GERD (gastroesophageal reflux disease)   . Headache   . HLD (hyperlipidemia)   . Homelessness 11/12/2011  . 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 Hx of CABG   . Hx of transesophageal echocardiography (TEE) for monitoring 11/2010   TEE 11/2010: EF 60-65%, BAE, trivial atrial septal shunt;  right heart cath in 10/2010 with elevated R and L heart pressures and diuretic started  . Hypertension   . Hypothyroidism   . Persistent atrial fibrillation (HCC) 10/2014  . Pulmonary nodules    repeat CT due in 11/2011  . Sleep apnea    recent sleep study  in 04/2014 per chart review  shows no significant OSA    Past Surgical History:  Procedure Laterality Date  . bilateral knee surgery    . BLADDER SURGERY    . CARDIOVERSION N/A 02/17/2014   Procedure: CARDIOVERSION;  Surgeon: Pricilla Riffle, MD;  Location: [facility] ENDOSCOPY;  Service: Cardiovascular;  Laterality: N/A;  . CARDIOVERSION N/A 09/20/2016   Procedure: CARDIOVERSION;  Surgeon: Lars Masson, MD;  Location: [facility] ENDOSCOPY;  Service: Cardiovascular;  Laterality: N/A;  . cath 2012    . CHEST WALL RECONSTRUCTION    . CORONARY ARTERY BYPASS GRAFT    . debriment for infection in chest    . HERNIA REPAIR    . LEFT AND RIGHT HEART CATHETERIZATION WITH CORONARY ANGIOGRAM N/A 11/10/2014   Procedure: LEFT AND RIGHT HEART CATHETERIZATION WITH CORONARY ANGIOGRAM;  Surgeon: Marykay Lex, MD;  Location: [facility] CATH LAB;  Service: Cardiovascular;  Laterality: N/A;  . Left  mastoidectomy    . TEE WITHOUT CARDIOVERSION N/A 02/17/2014   Procedure: TRANSESOPHAGEAL ECHOCARDIOGRAM (TEE);  Surgeon: Pricilla Riffle, MD;  Location: [facility] ENDOSCOPY;  Service: Cardiovascular;  Laterality: N/A;     Prior to Admission medications   Medication Sig Start Date End Date Taking? Authorizing Provider  atorvastatin (LIPITOR) 20 MG tablet Take 1 tablet (20 mg total) by mouth daily. 03/27/17  Yes Lars Masson, MD  buPROPion (WELLBUTRIN XL) 300 MG 24 hr tablet Take 1 tablet (300 mg total) by mouth every morning. 10/29/17  Yes Plovsky, Earvin Hansen, MD  clopidogrel (PLAVIX) 75 MG tablet Take 1 tablet (75 mg total) by mouth every morning. 03/27/17  Yes Lars Masson, MD  escitalopram (LEXAPRO) 10 MG tablet Take 1 tablet (10 mg total) by mouth daily. 10/29/17 10/29/18 Yes Plovsky, Earvin Hansen, MD  fluticasone (FLONASE) 50 MCG/ACT nasal spray Place 2 sprays into both nostrils daily. 08/15/16  Yes Panosh, Neta Mends, MD  levothyroxine (SYNTHROID) 100 MCG tablet Take 1 tablet (100 mcg total)  by mouth daily before breakfast. 03/28/17  Yes Lars Masson, MD  magnesium oxide (MAG-OX) 400 MG tablet Take 1 tablet (400 mg total) by mouth daily. 09/17/16  Yes Lars Masson, MD  PROAIR HFA [phone] Base) MCG/ACT inhaler INHALE 2 PUFFS BY MOUTH EVERY 6 HOURS AS NEEDED FOR SHORTNESS OF BREATH 02/03/18  Yes Leslye Peer, MD  ranitidine (ZANTAC) 150 MG tablet Take 1 tablet (150 mg total) by mouth 2 (two) times daily. Patient taking differently: Take 150 mg by mouth 2 (two) times daily as needed for heartburn.  04/16/17  Yes Panosh, Neta Mends, MD  rivaroxaban (XARELTO) 20 MG TABS tablet TAKE ONE TABLET BY MOUTH ONCE DAILY WITH  SUPPER 01/30/17  Yes Lars Masson, MD  topiramate (TOPAMAX) 50 MG tablet Take 1 tablet (50 mg total) by mouth 2 (two) times daily. 10/13/17  Yes Micki Riley, MD  nitroGLYCERIN (NITROSTAT) 0.4 MG SL tablet Place 1 tablet (0.4 mg total) under the tongue every 5 (five) minutes as needed for  chest pain. Patient not taking: Reported on 03/18/2018 01/24/15   Lars Masson, MD  ondansetron (ZOFRAN-ODT) 4 MG disintegrating tablet DISSOLVE ONE TABLET IN MOUTH EVERY 8 HOURS AS NEEDED FOR NAUSEA Patient not taking: Reported on 03/18/2018 10/14/17   Panosh, Neta Mends, MD  potassium chloride SA (K-DUR,KLOR-CON) 20 MEQ tablet TAKE 1 TABLET BY MOUTH 3  TIMES DAILY Patient not taking: Reported on 03/18/2018 10/04/16   Lars Masson, MD  Tiotropium Bromide Monohydrate (SPIRIVA RESPIMAT) 2.5 MCG/ACT AERS Inhale 1 puff into the lungs daily. Patient not taking: Reported on 03/18/2018 10/02/16   Lars Masson, MD    Inpatient Medications: Scheduled Meds: . amLODipine  5 mg Oral Daily  . buPROPion  300 mg Oral q morning - 10a  . escitalopram  10 mg Oral Daily  . famotidine  20 mg Oral Daily  . fluticasone  2 spray Each Nare Daily  . levothyroxine  100 mcg Oral QAC breakfast  . magnesium oxide  400 mg Oral Daily  . pantoprazole  40 mg Oral Daily  . senna-docusate  1 tablet Oral BID  . topiramate  50 mg Oral BID   Continuous Infusions: . sodium chloride 75 mL/hr at 03/19/18 0714  . clevidipine Stopped (03/19/18 1414)  . diltiazem (CARDIZEM) infusion     PRN Meds: acetaminophen **OR** acetaminophen (TYLENOL) oral liquid 160 mg/5 mL **OR** acetaminophen, ipratropium-albuterol, labetalol, ondansetron (ZOFRAN) IV  Allergies:     Allergies  Allergen Reactions  . Avelox [Moxifloxacin Hcl In Nacl] Other (See Comments)    Mental breakdown.  [name] [Nortriptyline Hcl] Other (See Comments)    Made her want to hurt herself  . Amoxicillin Hives  . Hydrocodone Itching  . Oxycodone Itching  . Penicillins Hives    Has patient had a PCN reaction causing immediate rash, facial/tongue/throat swelling, SOB or lightheadedness with hypotension: No Has patient had a PCN reaction causing severe rash involving mucus membranes or skin necrosis: No Has patient had a PCN reaction that required  hospitalization No Has patient had a PCN reaction occurring within the last 10 years: No If all of the above answers are "NO", then may proceed with Cephalosporin use.   . Sulfa Antibiotics Other (See Comments)    unknown    Social History:   Social History   Socioeconomic History  . Marital status: Divorced    Spouse name: Not on file  . Number of children: 2  .  Years of education: Not on file  . Highest education level: Not on file  Occupational History  . Occupation: DISABLE   Social Needs  . Financial resource strain: Not on file  . Food insecurity:    Worry: Not on file    Inability: Not on file  . Transportation needs:    Medical: Not on file    Non-medical: Not on file  Tobacco Use  . Smoking status: Current Every Day Smoker    Packs/day: 0.50    Years: 30.00    Pack years: 15.00    Types: Cigarettes    Last attempt to quit: 09/10/2014    Years since quitting: 3.5  . Smokeless tobacco: Never Used  Substance and Sexual Activity  . Alcohol use: No    Alcohol/week: 0.0 oz  . Drug use: No  . Sexual activity: Not Currently  Lifestyle  . Physical activity:    Days per week: Not on file    Minutes per session: Not on file  . Stress: Not on file  Relationships  . Social connections:    Talks on phone: Not on file    Gets together: Not on file    Attends religious service: Not on file    Active member of club or organization: Not on file    Attends meetings of clubs or organizations: Not on file    Relationship status: Not on file  . Intimate partner violence:    Fear of current or ex partner: Not on file    Emotionally abused: Not on file    Physically abused: Not on file    Forced sexual activity: Not on file  Other Topics Concern  . Not on file  Social History Narrative   Divorced   After stroke was living with sister and mother after sister asked her to leave. Mom has since deceased    Difficulties over control.    Then moved back in for about 6 weeks.    . tranportaion difficult son  helping   Ex-smoker   Was living at friends  Sister and her had argument and she was told to leave .    She was living in a homeless shelter for the last 3 weeks [facility]    son had help with transportation and medication       Work status regular  before got sick on short-term disability now lost t insurance after the stroke and couldn't work was  denied ssi    2 times.  She is not eligible for Medicaid because she doesn't have dependent children.         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   Lives in  rented house 2 house mates males keep tp self has her own b room  Doing well     Family History:   Family History  Problem Relation Age of Onset  . COPD Mother   . Heart disease Mother   . Arthritis Mother        Rheumatoid and PMR  . Osteoporosis Mother        Mom fractured hip  . Diabetes type II Mother   . Depression Mother   . Heart attack Father   . Depression Father   .  Hypertension Father   . Alcohol abuse Father   . Depression Sister   . Anxiety disorder Sister   . Drug abuse Sister   . Stroke Neg Hx    Family Status:  Family Status  Relation Name Status  . Mother [name] Deceased  . Father Vern Deceased at age 16  . Sister Lars Pinks  . MGM  Deceased  . MGF  Deceased  . PGM  Deceased  . PGF  Deceased  . Neg Hx  (Not Specified)    ROS:  Please see the history of present illness.  All other ROS reviewed and negative.     Physical Exam/Data:   Vitals:   03/19/18 0900 03/19/18 1000 03/19/18 1030 03/19/18 1200  BP: (!) 131/97 (!) 145/103 119/86   Pulse: 98 94 (!) 105   Resp: 20 (!) 26 (!) 21   Temp:    98 F (36.7 C)  TempSrc:    Oral  SpO2: 92% 96% 97%   Weight:      Height:        Intake/Output  Summary (Last 24 hours) at 03/19/2018 1436 Last data filed at 03/19/2018 1000 Gross per 24 hour  Intake 1384.82 ml  Output 1700 ml  Net -315.18 ml   Filed Weights   03/18/18 0637 03/18/18 1000  Weight: 218 lb (98.9 kg) 171 lb 4.8 oz (77.7 kg)   Body mass index is 29.4 kg/m.   General: Older than stated age, NAD Skin: Warm, dry, intact  Head: Normocephalic, atraumatic, clear, moist mucus membranes. Neck: Negative for carotid bruits. No JVD Lungs: Diminished bilaterally. No wheezes, rales, or rhonchi. Breathing is mildly labored Cardiovascular: Irregularly irregular with S1 S2. No murmurs, rubs or gallops Abdomen: Soft, non-tender, non-distended with normoactive bowel sounds. No obvious abdominal masses. MSK: Strength and tone appear normal for age. 5/5 in all extremities Extremities: No edema. No clubbing or cyanosis. DP/PT pulses 1+ bilaterally Neuro: Alert and oriented. No focal deficits. No facial asymmetry. MAE spontaneously. Psych: Responds to questions appropriately with flat affect.  EKG:  The EKG was personally reviewed and demonstrates: 03/19/2018 atrial fibrillation with HR 125 Telemetry:  Telemetry was personally reviewed and demonstrates: 03/19/2018 atrial fibrillation HR 98  Relevant CV Studies:  LHC/PCI 11/10/14: LM: ok LAD: stent in the proximal segment that is roughly 20% in-stent restenosis. RI: 70% proximal disease.  LCx:  80-90% stenosis in the mid AV groove segment RCA: prox 40%, mid 40-50%, RPDA ostial 95% then 95-99% then 60% L-LAD:  Atretic (old) S-RI/OM:  Patent  S-RPDA:  prox 100% PCI:  Suboptimal PTCA only of the ostial RPDA and mid RPDA leading to the anastomosis site. Unable to fully expand the balloon.  Echo 11/08/14: - Mild focal basal hypertrophy of the septum. LVF vigorous. EF 65% to 70%.  HK basalinferior myocardium. Grade 1 diastolic dysfunction. - Aortic valve: There was trivial regurgitation. - Left atrium: The atrium was mildly  dilated. Impressions: Compared to the prior study, there has been no significant interval change.  Myoview 02/19/14: IMPRESSION: No evidence for reversibility or myocardial ischemia.  Calculated ejection fraction is 55%.  Carotid US 5/15: No sig ICA stenosis  Laboratory Data:  Chemistry Recent Labs  Lab 03/18/18 0658  NA 136  K 4.1  CL 104  CO2 21*  GLUCOSE 101*  BUN 12  CREATININE 1.20*  CALCIUM 8.6*  GFRNONAA 48*  GFRAA 56*  ANIONGAP 11    Total Protein  Date Value Ref Range Status  03/18/2018 7.7  6.5 - 8.1 g/dL Final  17/35/6701 6.3 6.0 - 8.5 g/dL Final   Albumin  Date Value Ref Range Status  03/18/2018 4.0 3.5 - 5.0 g/dL Final  41/11/129 3.9 3.5 - 5.5 g/dL Final   AST  Date Value Ref Range Status  03/18/2018 23 15 - 41 U/L Final   ALT  Date Value Ref Range Status  03/18/2018 9 0 - 44 U/L Final    Comment:    Please note change in reference range.   Alkaline Phosphatase  Date Value Ref Range Status  03/18/2018 64 38 - 126 U/L Final   Total Bilirubin  Date Value Ref Range Status  03/18/2018 1.4 (H) 0.3 - 1.2 mg/dL Final   Bilirubin Total  Date Value Ref Range Status  09/13/2016 0.2 0.0 - 1.2 mg/dL Final   Hematology Recent Labs  Lab 03/18/18 0658  WBC 8.3  RBC 4.30  HGB 14.3  HCT 42.2  MCV 98.1  MCH 33.3  MCHC 33.9  RDW 13.6  PLT 296   Cardiac EnzymesNo results for input(s): TROPONINI in the last 168 hours.  Recent Labs  Lab 03/18/18 0705  TROPIPOC 0.03    BNPNo results for input(s): BNP, PROBNP in the last 168 hours.  DDimer No results for input(s): DDIMER in the last 168 hours. TSH:  Lab Results  Component Value Date   TSH 2.880 05/07/2017   Lipids: Lab Results  Component Value Date   CHOL 188 10/08/2016   HDL 65.60 10/08/2016   LDLCALC 106 (H) 10/08/2016   LDLDIRECT 153.0 01/15/2010   TRIG 83.0 10/08/2016   CHOLHDL 3 10/08/2016   HgbA1c: Lab Results  Component Value Date   HGBA1C  02/11/2011    5.4 (NOTE)                                                                        According to the ADA Clinical Practice Recommendations for 2011, when HbA1c is used as a screening test:   >=6.5%   Diagnostic of Diabetes Mellitus           (if abnormal result  is confirmed)  5.7-6.4%   Increased risk of developing Diabetes Mellitus  References:Diagnosis and Classification of Diabetes Mellitus,Diabetes Care,2011,34(Suppl 1):S62-S69 and Standards of Medical Care in         Diabetes - 2011,Diabetes Care,2011,34  (Suppl 1):S11-S61.    Radiology/Studies:  Ct Head Wo Contrast  Result Date: 03/19/2018 CLINICAL DATA:  Follow up stroke. EXAM: CT HEAD WITHOUT CONTRAST TECHNIQUE: Contiguous axial images were obtained from the base of the skull through the vertex without intravenous contrast. COMPARISON:  CT HEAD March 18, 2018. FINDINGS: BRAIN: Similar intraventricular hemorrhage predominately within LEFT lateral ventricle. Moderate ventriculomegaly in the basis of parenchymal brain volume loss. No hydrocephalus. No intraparenchymal hemorrhage or acute large vascular territory infarct. Old RIGHT basal ganglia, frontotemporal infarcts. Ex vacuo dilatation RIGHT frontal horn of the lateral ventricle. Patchy supratentorial white matter hypodensities exclusively for mention abnormality. No abnormal extra-axial fluid collections. Basal cisterns are patent. VASCULAR: Mild calcific atherosclerosis of the carotid siphons. SKULL: No skull fracture. LEFT parietal scalp scarring. Numerous subcentimeter calcified scalp tricholemmal cysts. SINUSES/ORBITS: LEFT wall up mastoidectomy. Small amount of soft tissue within the surgical bed.  Small RIGHT mastoid effusion.The included ocular globes and orbital contents are non-suspicious. OTHER: None. IMPRESSION: 1. Similar intraventricular hemorrhage.  No hydrocephalus. 2. Old RIGHT MCA territory infarct. 3. Mild chronic small vessel ischemic changes. Electronically Signed   By: Awilda Metro M.D.    On: 03/19/2018 05:31   Ct Head Wo Contrast  Result Date: 03/18/2018 CLINICAL DATA:  Headache and lethargy. EXAM: CT HEAD WITHOUT CONTRAST TECHNIQUE: Contiguous axial images were obtained from the base of the skull through the vertex without intravenous contrast. COMPARISON:  March 18, 2015 FINDINGS: Brain: There is mild diffuse atrophy, stable. Hemorrhage fills the left lateral ventricle and much of the third lateral ventricle. A small amount of hemorrhage extends into the posterior aspect of the frontal horn of the right lateral ventricle. This hemorrhage is causing slight dilatation of the left lateral ventricle. No hemorrhage is evident outside of the ventricular system currently. There is 3 mm of midline shift toward the right. There is evidence of a prior infarct involving portions of the posterior right frontal and superior right temporal lobe with infarct involving much of the right lentiform nucleus as well as the right claustrum, extreme capsule, and insular cortex. Elsewhere there is patchy small vessel disease in the centra semiovale bilaterally. No well-defined acute infarct evident. No subdural or epidural fluid collections are evident. No mass evident. There is stable prominence of the cavernous sinus region on the right compared to the left, stable from 2016. Vascular: There is no hyperdense vessel. There is calcification in each carotid siphon. Skull: Bony calvarium appears intact. Sinuses/Orbits: There is mucosal thickening in multiple ethmoid air cells. Orbits appear symmetric bilaterally. Other: Patient has had previous mastoidectomy on the left. There is opacification of the remaining mastoid air cells on the left. There is opacification in multiple mastoid air cells on the right. There is a sebaceous cyst posterior to the midline occipital bones measuring 8 x 7 mm. IMPRESSION: Focal hemorrhage filling most of the left lateral and third ventricles with mild extension into the posterior aspect  of the frontal horn of the right lateral ventricle. There is slight dilatation of the left lateral ventricle. There is 3 mm of midline shift toward the right. No hemorrhage is seen within the brain parenchyma on this study. There is atrophy. There is prior infarct involving portions of the posterior right frontal and superior anterior right temporal lobes as well as much of the right claustrum, extreme capsule, and insular cortex. Much of the right lentiform nucleus is involved with this infarct as well. There is patchy periventricular small vessel disease elsewhere without evident acute infarct. There are foci of arterial vascular calcification. There is mucosal thickening in multiple ethmoid air cells. There is extensive mastoid disease bilaterally with evidence of previous mastoidectomy on the left. Critical Value/emergent results were called by telephone at the time of interpretation on 03/18/2018 at 7:47 am to Dr. Bethann Berkshire , who verbally acknowledged these results. Electronically Signed   By: Bretta Bang III M.D.   On: 03/18/2018 07:47   Dg Chest Portable 1 View  Result Date: 03/18/2018 CLINICAL DATA:  Atrial fibrillation. EXAM: PORTABLE CHEST 1 VIEW COMPARISON:  Jan 08, 2017 FINDINGS: There is no edema or consolidation. Heart is upper normal in size with pulmonary vascularity normal. Patient is status post coronary artery bypass grafting. There are surgical clips over the lateral upper right hemithorax. There is aortic atherosclerosis. No evident bone lesions. IMPRESSION: No edema or consolidation. Stable cardiac silhouette. Status post coronary  artery bypass grafting. There is aortic atherosclerosis. Aortic Atherosclerosis (ICD10-I70.0). Electronically Signed   By: Bretta Bang III M.D.   On: 03/18/2018 07:14    Assessment and Plan:   1.  Atrial fibrillation with RVR with known history of PAF: -Patient has a long history of atrial fibrillation on Xarelto and amiodarone. She presented  on 03/18/2018 with severe headache and found to have a hemorrhagic stroke. It appears that she was in atrial fibrillation with RVR per EKG on admission. Her Xarelto was stopped and she was given Andexxa reversal agent. Neurosurgery was consulted and felt that she did not warrant neurosurgical intervention or ventricular drain at this time given reassuring neurological exam. -Patient was given 40 mg labetalol with temporary rate control. Diltiazem gtt ordered and awaiting initiation. -Patient has a history of bradycardia with beta-blockers therefore, would prefer CCB for rate control -Per chart review, patient has been on amiodarone 200 mg daily however I do not see this on her current inpatient medication list.  Per last office note on 03/27/2017 she was to continue the amiodarone. -Patient not currently a candidate for anticoagulation secondary to hemorrhagic CVA.  Will focus on  rate control>>> no anticoagulants or antiplatelets -Echocardiogram, pending results -Troponin, 0.03 likely in the setting of demand ischemia secondary to elevated heart rate -CHA2DS2VASc =5  2.  Coronary artery disease s/p CABG and subsequent stenting of the native LAD (LIMA-LAD found to be atretic): -Denies recent chest pain or associated symptoms -Echocardiogram with pending results.  Last echocardiogram 2016 with normal EF and G1 DD given her complaints of progressive weakness and shortness of breath over the last several months agree with echocardiogram for evaluation. -Troponin, 0.03, likely in the setting of demand ischemia secondary to elevated heart rate -No anticoagulants or antiplatelets  -Not considered a candidate for beta-blocker secondary to bradycardia per chart review  3.  Recent CVA: -Presented to [facility] on 03/18/2018 after persistent 1xday severe headache.  Patient has a history of prior CVA.  Given her symptoms she presented to [facility] in which a head CT was performed and revealed a hemorrhagic CVA.  Neurosurgery  was consulted however given her reassuring neurological exam no neurosurgical intervention or ventricular drain needed at this time.  4.  Essential hypertension with hypertensive emergency: -BP on presentation 199/120.  Goal per neurology SBP <140 -Stable, 140/107, 122/87, 143/110 -Stop amlodipine -Diltiazem gtt for atrial fibrillation, will help with BP control as well  5.  Hyperlipidemia: -Stable, continue atorvastatin 20 mg daily discharge -Lipid panel, CHO- 188, HDL-65, LDL-106,Trig-83 from 10/08/2016  6.  Hypothyroidism: -TSH, 2.880, within normal limits -Continue levothyroxine 100 mcg daily  7.  Tobacco abuse: -Smoking cessation strongly encouraged  For questions or updates, please contact [facility] Please consult [email or web address] for contact info under Cardiology/STEMI.   Raliegh Ip NP-C [facility] Pager: [phone] 03/19/2018 2:36 PM   The patient was seen, examined and discussed with Georgie Chard, NP  and I agree with the above.   62 y.o. female with a hx of CAD s/p CABG and subsequent stenting of the native LAD (LIMA-LAD found to be atretic), PAF (on amiodarone and chronic Xarelto), chronic combined systolic and diastolic HF (EF 21% in 2015 >>improved to 65-70% in 3/16), HTN, HL, CKD, OSA, CVA in 2012, COPD, chronic ongoing smoker who was admitted with severe headaches and was diagnosed with acute hemorrhagic stroke CT head shows focal hemorrhage filling most of the left lateral and third ventricles with 3 mm of midline shift towards the  right.  Neurosurgery decided not to perform intervention given reassuring neurological exam, her MRI is pending.  She was given reversal agent Andexxa.   Her home meds Plavix and Xarelto were both held.  On physical exam her blood pressure is elevated in 140s, her heart rate is in low 100s she is in atrial fibrillation, she is not in acute distress denies any chest pain or shortness of breath, she has chronic facial droop post  prior stroke, she is wheezing bilaterally has irregular heart and rhythm no significant murmurs, mild trach crackles at both bases, and no lower extremity edema.  Echocardiogram showed LVEF 55 to 60% and only mildly dilated left atrium.  I agree with reversal agent, and holding Xarelto and Plavix, we will follow closely, I would start Cardizem drip starting at 5 mg/h and uptitrating to keep systolic blood pressure under 213 and heart rate under 100.  Based on requirements we will then switch to long-acting Cardizem p.o.  We will hold her amlodipine.  I also agree with breathing treatment for wheezing.  She will have to definitely quit smoking.  I would start low-dose Lasix 20 mg IV twice daily for mild fluid overload on physical exam and chest x-ray.  Tobias Alexander, MD 03/19/2018

## 2018-03-19 NOTE — Progress Notes (Addendum)
Patient noted to be more confused, with right side weakness, drift noted in RLE, little effort against gravity on LLE. Garbled speech noted. Patient also became increasingly agitated and yelling at nurse who was bedside " I need help" When RN asked her what she needed help with she stated " I need someone who understands me". She also began to cry out her son's name "[name]". Dr. Amada Jupiter notifed, new orders for stat CT received and carried out. Will continue to monitor. Carney Living RN BSN. On return from CT scan patient is now calm and back to original orientation status as well as having zero  Drift in any of her extremities. Speech also no longer sounds garbled.

## 2018-03-19 NOTE — Progress Notes (Signed)
  Echocardiogram 2D Echocardiogram has been performed.  [name] 03/19/2018, 1:51 PM

## 2018-03-19 NOTE — Evaluation (Signed)
Physical Therapy Evaluation Patient Details Name: [name] MRN: [id number] DOB: 1955/11/18 Today's Date: 03/19/2018   History of Present Illness   [name] is a 62 y.o. female with a history of atrial fibrillation, hypertension, CHF, COPD. CABG and previous CVA w/ L sided weakness who presents with intraventricular hemorrhage.  Clinical Impression  Patient presents with generalized weakness, decreased balance, decreased activity tolerance with HR up to 150's with ambulation to bathroom, decreased safety awareness, decreased cognition and will benefit from skilled PT in the acute setting to allow return to independent following STSNF level rehab stay.    Follow Up Recommendations SNF;Supervision/Assistance - 24 hour    Equipment Recommendations  Rolling walker with 5" wheels    Recommendations for Other Services       Precautions / Restrictions Precautions Precautions: Fall      Mobility  Bed Mobility               General bed mobility comments: up in bathroom with nursing  Transfers Overall transfer level: Needs assistance Equipment used: Rolling walker (2 wheeled) Transfers: Sit to/from Stand Sit to Stand: Min assist         General transfer comment: stand to sit on recliner, increased time, assist with UE's, cues & assist for safety  Ambulation/Gait Ambulation/Gait assistance: Mod assist Gait Distance (Feet): 12 Feet Assistive device: Rolling walker (2 wheeled) Gait Pattern/deviations: Step-to pattern;Decreased stride length;Shuffle     General Gait Details: shuffling with walker bathroom to recliner with HR in 150's so limited ambulation today  Stairs            Wheelchair Mobility    Modified Rankin (Stroke Patients Only)       Balance Overall balance assessment: Needs assistance   Sitting balance-Leahy Scale: Fair       Standing balance-Leahy Scale: Poor Standing balance comment: difficulty with balance to wash hands and to  clean herself after toileting, at least minguard needed                             Pertinent Vitals/Pain Pain Assessment: No/denies pain    Home Living Family/patient expects to be discharged to:: Private residence Living Arrangements: Alone Available Help at Discharge: (None) Type of Home: Other(Comment) Home Access: Stairs to enter Entrance Stairs-Rails: None Entrance Stairs-Number of Steps: 7 Home Layout: Two level;Bed/bath upstairs Home Equipment: Hand held shower head      Prior Function Level of Independence: Independent         Comments: does not drive, son assists with groceries     Hand Dominance   Dominant Hand: Right    Extremity/Trunk Assessment   Upper Extremity Assessment Upper Extremity Assessment: Defer to OT evaluation    Lower Extremity Assessment Lower Extremity Assessment: RLE deficits/detail;LLE deficits/detail RLE Deficits / Details:  AROM WFL, strength grossly 4/5 LLE Deficits / Details:  AROM WFL, strength grossly 4/5       Communication      Cognition Arousal/Alertness: Awake/alert Behavior During Therapy: WFL for tasks assessed/performed Overall Cognitive Status: Impaired/Different from baseline Area of Impairment: Awareness;Orientation;Memory;Attention;Problem solving;Safety/judgement                 Orientation Level: Disoriented to;Time Current Attention Level: Selective Memory: Decreased short-term memory   Safety/Judgement: Decreased awareness of safety;Decreased awareness of deficits   Problem Solving: Slow processing        General Comments      Exercises  Assessment/Plan    PT Assessment Patient needs continued PT services  PT Problem List Decreased strength;Decreased mobility;Decreased cognition;Decreased balance;Decreased knowledge of precautions;Decreased safety awareness;Decreased activity tolerance       PT Treatment Interventions DME instruction;Therapeutic activities;Gait  training;Therapeutic exercise;Patient/family education;Balance training;Functional mobility training    PT Goals (Current goals can be found in the Care Plan section)  Acute Rehab PT Goals Patient Stated Goal: to regain independence PT Goal Formulation: With patient Time For Goal Achievement: 04/02/18 Potential to Achieve Goals: Good    Frequency Min 3X/week   Barriers to discharge        Co-evaluation PT/OT/SLP Co-Evaluation/Treatment: Yes Reason for Co-Treatment: Other (comment);Necessary to address cognition/behavior during functional activity(activity tolerance) PT goals addressed during session: Mobility/safety with mobility;Balance;Proper use of DME         AM-PAC PT "6 Clicks" Daily Activity  Outcome Measure Difficulty turning over in bed (including adjusting bedclothes, sheets and blankets)?: A Lot Difficulty moving from lying on back to sitting on the side of the bed? : Unable Difficulty sitting down on and standing up from a chair with arms (e.g., wheelchair, bedside commode, etc,.)?: Unable Help needed moving to and from a bed to chair (including a wheelchair)?: A Little Help needed walking in hospital room?: A Lot Help needed climbing 3-5 steps with a railing? : A Lot 6 Click Score: 11    End of Session Equipment Utilized During Treatment: Gait belt Activity Tolerance: Treatment limited secondary to medical complications (Comment)(tachycardia (150's)) Patient left: with call bell/phone within reach;in chair Nurse Communication: Mobility status;Other (comment) PT Visit Diagnosis: Unsteadiness on feet (R26.81);Other abnormalities of gait and mobility (R26.89);Muscle weakness (generalized) (M62.81);Other symptoms and signs involving the nervous system (R29.898)    Time: 1610-9604 PT Time Calculation (min) (ACUTE ONLY): 20 min   Charges:   PT Evaluation $PT Eval Moderate Complexity: 1 Mod     PT G CodesSheran Lawless,  [place] [phone] 03/19/2018   [name] 03/19/2018, 2:44 PM

## 2018-03-19 NOTE — NC FL2 (Addendum)
[place] MEDICAID FL2 LEVEL OF CARE SCREENING TOOL     IDENTIFICATION  Patient Name: [name] Birthdate: Jan 22, 1956 Sex: female Admission Date (Current Location): 03/18/2018  [facility] and [place] Number:  Producer, television/film/video and Address:  [facility], [address], [place], [place] [address]      Provider Number: [id number]  Attending Physician Name and Address:  Micki Riley, MD  Relative Name and Phone Number:  [name], son, [phone]    Current Level of Care: Hospital Recommended Level of Care: Skilled Nursing Facility Prior Approval Number:    Date Approved/Denied:   PASRR Number: pending  Discharge Plan: SNF    Current Diagnoses: Patient Active Problem List   Diagnosis Date Noted  . IVH (intraventricular hemorrhage) (HCC) 03/19/2018  . 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 Long term current use of amiodarone 03/27/2017  . Severe recurrent major depression without psychotic features (HCC) 11/19/2016    Class: Chronic  . Pre-procedure lab exam 09/13/2016  . Generalized headaches 07/04/2016  . Protein-calorie malnutrition, severe (HCC) 04/03/2015  . Chronic diastolic CHF (congestive heart failure), NYHA class 2 (HCC) 01/24/2015  . Demand ischemia secondary to AF with RVR 12/13/2014  . CKD (chronic kidney disease), stage III (HCC) 11/11/2014  . Hypokalemia 05/22/2014  . Mood disorder (HCC) 03/20/2014  . HTN (hypertension) 03/20/2014  . Anemia, unspecified 03/20/2014  . Tobacco use disorder 03/20/2014  . Atrial fibrillation with RVR (HCC) 03/03/2014  . Hypotension 03/03/2014  . Pulmonary hypertension (HCC) 03/03/2014  . COPD (chronic obstructive pulmonary disease) (HCC) 03/03/2014  . History of right MCA stroke   . Long-term (current) use of anticoagulants   . Cardiomyopathy-h/o tachycardia mediated-EF 65% per echo March 2016 02/18/2014  . Hyperlipidemia 02/18/2014  . CAD '07, LAD PCI 2012, SVG-PDA PTCA 11/10/14 02/16/2014   . Chronic diastolic heart failure (HCC)   . Pulmonary nodules   . Hypothyroidism   . OSA- C-pap intol     Orientation RESPIRATION BLADDER Height & Weight     Self, Situation, Place  Normal Continent, Indwelling catheter Weight: 171 lb 4.8 oz (77.7 kg) Height:  5\' 4"  (162.6 cm)  BEHAVIORAL SYMPTOMS/MOOD NEUROLOGICAL BOWEL NUTRITION STATUS      Continent Diet(see discharge summary)  AMBULATORY STATUS COMMUNICATION OF NEEDS Skin   Extensive Assist Verbally Normal                       Personal Care Assistance Level of Assistance  Bathing, Feeding, Dressing Bathing Assistance: Maximum assistance Feeding assistance: Limited assistance Dressing Assistance: Maximum assistance     Functional Limitations Info  Speech, Hearing, Sight Sight Info: Adequate Hearing Info: Adequate Speech Info: Adequate    SPECIAL CARE FACTORS FREQUENCY  PT (By licensed PT), OT (By licensed OT)     PT Frequency: 5x week OT Frequency: 5x week            Contractures Contractures Info: Not present    Additional Factors Info  Code Status, Allergies, Psychotropic Code Status Info: Full Code Allergies Info: AVELOX MOXIFLOXACIN HCL IN NACL, PAMELOR NORTRIPTYLINE HCL, AMOXICILLIN, HYDROCODONE, OXYCODONE, PENICILLINS, SULFA ANTIBIOTICS  Psychotropic Info: buPROPion (WELLBUTRIN XL) 24 hr tablet 300 mg PO daily at 10am; escitalopram (LEXAPRO) tablet 10 mg daily PO         Current Medications (03/19/2018):  This is the current hospital active medication list Current Facility-Administered Medications  Medication Dose Route Frequency Provider Last Rate Last Dose  . 0.9 %  sodium chloride infusion   Intravenous Continuous Rejeana Brock, MD 75 mL/hr at 03/19/18 0714    . acetaminophen (TYLENOL) tablet 650 mg  650 mg Oral Q4H PRN Rejeana Brock, MD   650 mg at 03/19/18 0522   Or  . acetaminophen (TYLENOL) solution 650 mg  650 mg Per Tube Q4H PRN Rejeana Brock, MD       Or   . acetaminophen (TYLENOL) suppository 650 mg  650 mg Rectal Q4H PRN Rejeana Brock, MD      . buPROPion (WELLBUTRIN XL) 24 hr tablet 300 mg  300 mg Oral q morning - 10a Biby, Sharon L, NP   300 mg at 03/19/18 1057  . clevidipine (CLEVIPREX) infusion 0.5 mg/mL  0-21 mg/hr Intravenous Continuous Rejeana Brock, MD   Stopped at 03/19/18 1414  . diltiazem (CARDIZEM) 100 mg in dextrose 5% (1 mg/mL) infusion  5-15 mg/hr Intravenous Titrated Georgie Chard D, NP      . escitalopram (LEXAPRO) tablet 10 mg  10 mg Oral Daily Annie Main L, NP   10 mg at 03/19/18 1057  . famotidine (PEPCID) tablet 20 mg  20 mg Oral Daily Biby, Sharon L, NP      . fluticasone (FLONASE) 50 MCG/ACT nasal spray 2 spray  2 spray Each Nare Daily Annie Main L, NP   2 spray at 03/19/18 1058  . furosemide (LASIX) injection 20 mg  20 mg Intravenous BID Lars Masson, MD      . ipratropium-albuterol (DUONEB) 0.5-2.5 (3) MG/3ML nebulizer solution 3 mL  3 mL Nebulization Q6H PRN Rejeana Brock, MD      . labetalol (NORMODYNE,TRANDATE) injection 10 mg  10 mg Intravenous Q10 min PRN Rejeana Brock, MD   10 mg at 03/19/18 1439  . levothyroxine (SYNTHROID, LEVOTHROID) tablet 100 mcg  100 mcg Oral QAC breakfast Annie Main L, NP   100 mcg at 03/19/18 1057  . magnesium oxide (MAG-OX) tablet 400 mg  400 mg Oral Daily Annie Main L, NP   400 mg at 03/19/18 1058  . ondansetron (ZOFRAN) injection 4 mg  4 mg Intravenous Q6H PRN Aroor, Dara Lords, MD   4 mg at 03/19/18 1321  . pantoprazole (PROTONIX) EC tablet 40 mg  40 mg Oral Daily Rejeana Brock, MD   40 mg at 03/19/18 1020  . senna-docusate (Senokot-S) tablet 1 tablet  1 tablet Oral BID Rejeana Brock, MD   1 tablet at 03/19/18 1020  . topiramate (TOPAMAX) tablet 50 mg  50 mg Oral BID Layne Benton, NP   50 mg at 03/19/18 1021     Discharge Medications: Please see discharge summary for a list of discharge  medications.  Relevant Imaging Results:  Relevant Lab Results:   Additional Information SS# [id number]  Terence Lux [place], [place]  I have personally examined this patient, reviewed notes, independently viewed imaging studies, participated in medical decision making and plan of care.ROS completed by me personally and pertinent positives fully documented  I have made any additions or clarifications directly to the above note. Agree with note above.    Delia Heady, MD Medical Director [facility] Pager: [phone] 03/20/2018 4:01 PM

## 2018-03-19 NOTE — Evaluation (Signed)
Speech Language Pathology Evaluation Patient Details Name: [name] MRN: [id number] DOB: [birth year] Today's Date: 03/19/2018 Time: 2263-3354 SLP Time Calculation (min) (ACUTE ONLY): 27 min  Problem List:  Patient Active Problem List   Diagnosis Date Noted  . 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 Long term current use of amiodarone 03/27/2017  . Severe recurrent major depression without psychotic features (HCC) 11/19/2016    Class: Chronic  . Pre-procedure lab exam 09/13/2016  . Generalized headaches 07/04/2016  . Protein-calorie malnutrition, severe (HCC) 04/03/2015  . Chronic diastolic CHF (congestive heart failure), NYHA class 2 (HCC) 01/24/2015  . Demand ischemia secondary to AF with RVR 12/13/2014  . CKD (chronic kidney disease), stage III (HCC) 11/11/2014  . Hypokalemia 05/22/2014  . Mood disorder (HCC) 03/20/2014  . HTN (hypertension) 03/20/2014  . Anemia, unspecified 03/20/2014  . Tobacco use disorder 03/20/2014  . Atrial fibrillation with RVR (HCC) 03/03/2014  . Hypotension 03/03/2014  . Pulmonary hypertension (HCC) 03/03/2014  . COPD (chronic obstructive pulmonary disease) (HCC) 03/03/2014  . History of right MCA stroke   . Long-term (current) use of anticoagulants   . Cardiomyopathy-h/o tachycardia mediated-EF 65% per echo March 2016 02/18/2014  . Hyperlipidemia 02/18/2014  . CAD '07, LAD PCI 2012, SVG-PDA PTCA 11/10/14 02/16/2014  . Chronic diastolic heart failure (HCC)   . Pulmonary nodules   . Hypothyroidism   . OSA- C-pap intol    Past Medical History:  Past Medical History:  Diagnosis Date  . Acute right MCA stroke (HCC) 11/07/10  . Atrial fibrillation (HCC)    a. s/p TEE-DCCV 02/2104; b. Xarelto started  . CAD (coronary artery disease)    a.  cath 09/2010: LAD stent patent, S-Int/dCFX ok, S-PDA ok, L-LAD atretic;  b. Lexiscan Myoview (02/2014):  no ischemia, EF 55%; c. 11/2014 Cath/PCI: LM nl, LAD 20pISR, LCX 80-86m, OM1 nl, RI 70p, RCA 40-76m, RPDA  95ost/95-72m (PTCA only w/ reduction to 50p/61m), 60d, L->LAD atretic, VG->RI->OM nl, VG->PDA 100p.  . Cardiomyopathy with EF 40% at TEE 02/17/14 (likely tachycardia mediated - Myoview 02/19/14 neg for ischemia with normal EF) 02/18/2014  . COPD (chronic obstructive pulmonary disease) (HCC)   . Depression   . Eczema   . GERD (gastroesophageal reflux disease)   . Headache   . HLD (hyperlipidemia)   . Homelessness 11/12/2011  . 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 Hx of CABG   . Hx of transesophageal echocardiography (TEE) for monitoring 11/2010   TEE 11/2010: EF 60-65%, BAE, trivial atrial septal shunt;  right heart cath in 10/2010 with elevated R and L heart pressures and diuretic started  . Hypertension   . Hypothyroidism   . Persistent atrial fibrillation (HCC) 10/2014  . Pulmonary nodules    repeat CT due in 11/2011  . Sleep apnea    recent sleep study  in 04/2014 per chart review  shows no significant OSA   Past Surgical History:  Past Surgical History:  Procedure Laterality Date  . bilateral knee surgery    . BLADDER SURGERY    . CARDIOVERSION N/A 02/17/2014   Procedure: CARDIOVERSION;  Surgeon: Pricilla Riffle, MD;  Location: [facility] ENDOSCOPY;  Service: Cardiovascular;  Laterality: N/A;  . CARDIOVERSION N/A 09/20/2016   Procedure: CARDIOVERSION;  Surgeon: Lars Masson, MD;  Location: [facility] ENDOSCOPY;  Service: Cardiovascular;  Laterality: N/A;  . cath 2012    .  CHEST WALL RECONSTRUCTION    . CORONARY ARTERY BYPASS GRAFT    . debriment for infection in chest    . HERNIA REPAIR    . LEFT AND RIGHT HEART CATHETERIZATION WITH CORONARY ANGIOGRAM N/A 11/10/2014   Procedure: LEFT AND RIGHT HEART CATHETERIZATION WITH CORONARY ANGIOGRAM;  Surgeon: Marykay Lex, MD;  Location: [facility] CATH LAB;  Service: Cardiovascular;  Laterality: N/A;  . Left mastoidectomy    . TEE WITHOUT CARDIOVERSION N/A 02/17/2014    Procedure: TRANSESOPHAGEAL ECHOCARDIOGRAM (TEE);  Surgeon: Pricilla Riffle, MD;  Location: [facility] ENDOSCOPY;  Service: Cardiovascular;  Laterality: N/A;   HPI:  62 year old female with a history of hypertension, anticoagulation who presents with intraventricular hemorrhage likely secondary to hypertension and anticoagulation.   Assessment / Plan / Recommendation Clinical Impression  Pt demonstrates moderate cognitive impairment; Pt has high education level and intelligence, but function is limited by deficits in short term memory, organization and emergent awareness. Pt is able to verbalize that she is unable to adequately complete complex cognitive tasks independently at baseline and now seems further impaired by new hemorrhage. She is disoriented and at safety is at risk without appropriate assist. Pt will need 24 hour supervision at d/c, and would benefit from f/u with SLP for cognitive compensatory strategies for improved independence.     SLP Assessment  SLP Recommendation/Assessment: Patient needs continued Speech Lanaguage Pathology Services SLP Visit Diagnosis: Frontal lobe and executive function deficit Frontal lobe and executive function deficit following: Other Nontraumatic ICH    Follow Up Recommendations       Frequency and Duration min 2x/week  2 weeks      SLP Evaluation Cognition  Overall Cognitive Status: Impaired/Different from baseline Arousal/Alertness: Awake/alert Orientation Level: Oriented to person;Oriented to place;Disoriented to time Attention: Focused;Sustained;Selective Focused Attention: Appears intact Sustained Attention: Appears intact Selective Attention: Appears intact Memory: Impaired Memory Impairment: Storage deficit;Retrieval deficit;Decreased short term memory Decreased Short Term Memory: Verbal basic Awareness: Appears intact Problem Solving: Impaired Problem Solving Impairment: Verbal complex;Functional complex Executive Function:  Occupational hygienist: Network engineer: Impaired Organizing Impairment: Verbal complex;Functional complex Behaviors: Poor frustration tolerance       Comprehension  Auditory Comprehension Overall Auditory Comprehension: Appears within functional limits for tasks assessed    Expression Verbal Expression Overall Verbal Expression: Appears within functional limits for tasks assessed Written Expression Dominant Hand: Right Written Expression: Not tested   Oral / Motor  Oral Motor/Sensory Function Overall Oral Motor/Sensory Function: Within functional limits Motor Speech Overall Motor Speech: Impaired(prosody) Respiration: Within functional limits Phonation: (tremulous) Resonance: Within functional limits Articulation: Impaired Level of Impairment: Conversation Intelligibility: Intelligible Motor Planning: Witnin functional limits Motor Speech Errors: Groping for words Interfering Components: Premorbid status   GO                   Harlon Ditty, MA CCC-SLP [phone]  [name] 03/19/2018, 11:52 AM

## 2018-03-19 NOTE — Evaluation (Signed)
Occupational Therapy Evaluation Patient Details Name: [name] MRN: [id number] DOB: August 29, 1956 Today's Date: 03/19/2018    History of Present Illness  [name] is a 62 y.o. female with a history of atrial fibrillation, hypertension, CHF, COPD. CABG and previous CVA w/ L sided weakness who presents with intraventricular hemorrhage.   Clinical Impression   PTA pt mod I for ADL and mobility. Pt is currently presenting with deficits in cognition, generalized weakness, decreased balance, decreased safety awareness, decreased activity tolerance. She is min A for short ambulation from bathroom to recliner with RW requiring multimodal cues for safety and sequencing as well as assist for balance, Pt required cues to incorporate LUE in hand washing after peri care in standing (assist for balance and to manage clothing). Pt will require skilled OT in the acute setting and afterwards at the SNF level to maximize safety and independence in ADL and functional transfers. Next session to tease out vision vs cognition deficits,    Follow Up Recommendations  SNF;Supervision/Assistance - 24 hour    Equipment Recommendations  Other (comment)(defer to next venue)    Recommendations for Other Services       Precautions / Restrictions Precautions Precautions: Fall      Mobility Bed Mobility               General bed mobility comments: up in bathroom with nursing  Transfers Overall transfer level: Needs assistance Equipment used: Rolling walker (2 wheeled) Transfers: Sit to/from Stand Sit to Stand: Min assist         General transfer comment: stand to sit on recliner, increased time, assist with UE's, cues & assist for safety    Balance Overall balance assessment: Needs assistance   Sitting balance-Leahy Scale: Fair     Standing balance support: Bilateral upper extremity supported;Single extremity supported;During functional activity Standing balance-Leahy Scale:  Poor Standing balance comment: difficulty with balance to wash hands and to clean herself after toileting, at least minguard needed                           ADL either performed or assessed with clinical judgement   ADL Overall ADL's : Needs assistance/impaired Eating/Feeding: Minimal assistance;Sitting Eating/Feeding Details (indicate cue type and reason): assist to open some containers, cues for sequencing Grooming: Minimal assistance;Sitting;Wash/dry hands Grooming Details (indicate cue type and reason): cues to incorporate L side in hand washing Upper Body Bathing: Moderate assistance   Lower Body Bathing: Moderate assistance   Upper Body Dressing : Moderate assistance   Lower Body Dressing: Moderate assistance   Toilet Transfer: Moderate assistance;Ambulation;RW;Grab bars Toilet Transfer Details (indicate cue type and reason): assist to manage RW, cues for sequencing Toileting- Clothing Manipulation and Hygiene: Minimal assistance;Sit to/from stand Toileting - Clothing Manipulation Details (indicate cue type and reason): Pt with assist for stability in standing able to perform front peri care     Functional mobility during ADLs: Moderate assistance;Rolling walker;Cueing for sequencing;Cueing for safety General ADL Comments: met in bathroom with RN staff, HR in the 150's     Vision Baseline Vision/History: Wears glasses Wears Glasses: Reading only Patient Visual Report: No change from baseline Vision Assessment?: Yes Eye Alignment: Within Functional Limits Ocular Range of Motion: Within Functional Limits Alignment/Gaze Preference: Within Defined Limits Tracking/Visual Pursuits: Able to track stimulus in all quads without difficulty Saccades: Impaired - to be further tested in functional context Convergence: Within functional limits Visual Fields: No apparent deficits Additional  Comments: Pt unable to read from menu, cognition impacting?!?!     Perception      Praxis      Pertinent Vitals/Pain Pain Assessment: No/denies pain     Hand Dominance Right   Extremity/Trunk Assessment Upper Extremity Assessment Upper Extremity Assessment: Generalized weakness(L weakness at baseline from previous CVA)   Lower Extremity Assessment Lower Extremity Assessment: Defer to PT evaluation RLE Deficits / Details:  AROM WFL, strength grossly 4/5 LLE Deficits / Details:  AROM WFL, strength grossly 4/5       Communication     Cognition Arousal/Alertness: Awake/alert Behavior During Therapy: WFL for tasks assessed/performed Overall Cognitive Status: Impaired/Different from baseline Area of Impairment: Awareness;Orientation;Memory;Attention;Problem solving;Safety/judgement                 Orientation Level: Disoriented to;Time(thought it was 2018) Current Attention Level: Selective Memory: Decreased short-term memory   Safety/Judgement: Decreased awareness of safety;Decreased awareness of deficits   Problem Solving: Slow processing     General Comments       Exercises     Shoulder Instructions      Home Living Family/patient expects to be discharged to:: Private residence Living Arrangements: Alone Available Help at Discharge: Other (Comment)(None)   Home Access: Stairs to enter Entrance Stairs-Number of Steps: 7 Entrance Stairs-Rails: None Home Layout: Two level;Bed/bath upstairs Alternate Level Stairs-Number of Steps: flight Alternate Level Stairs-Rails: Left;Right Bathroom Shower/Tub: Occupational psychologist: Standard     Home Equipment: Hand held shower head      Lives With: (roomates, no assist)    Prior Functioning/Environment Level of Independence: Independent        Comments: does not drive, son assists with groceries        OT Problem List: Decreased activity tolerance;Impaired balance (sitting and/or standing);Decreased cognition;Decreased safety awareness      OT Treatment/Interventions:  Self-care/ADL training;Therapeutic exercise;Neuromuscular education;Energy conservation;DME and/or AE instruction;Therapeutic activities;Cognitive remediation/compensation;Balance training;Patient/family education    OT Goals(Current goals can be found in the care plan section) Acute Rehab OT Goals Patient Stated Goal: to regain independence OT Goal Formulation: With patient Time For Goal Achievement: 04/02/18 Potential to Achieve Goals: Good ADL Goals Pt Will Perform Grooming: standing;with min guard assist Pt Will Perform Upper Body Dressing: with modified independence;sitting Pt Will Perform Lower Body Dressing: with min guard assist;sit to/from stand Pt Will Transfer to Toilet: with min guard assist;ambulating Pt Will Perform Toileting - Clothing Manipulation and hygiene: with supervision;sit to/from stand Additional ADL Goal #1: Pt will implement 2 compensatory strategies for cognitive deficits to increase safety and ADL completion  OT Frequency: Min 2X/week   Barriers to D/C: Decreased caregiver support          Co-evaluation   Reason for Co-Treatment: Other (comment)(activity tolerance) PT goals addressed during session: Mobility/safety with mobility;Balance;Proper use of DME;Strengthening/ROM        AM-PAC PT "6 Clicks" Daily Activity     Outcome Measure Help from another person eating meals?: A Little Help from another person taking care of personal grooming?: A Little Help from another person toileting, which includes using toliet, bedpan, or urinal?: A Lot Help from another person bathing (including washing, rinsing, drying)?: A Lot Help from another person to put on and taking off regular upper body clothing?: A Lot Help from another person to put on and taking off regular lower body clothing?: A Lot 6 Click Score: 14   End of Session Equipment Utilized During Treatment: Gait belt;Rolling walker Nurse Communication: Mobility  status;Other (comment)(no chair alarm in  place)  Activity Tolerance: Treatment limited secondary to medical complications (Comment)(elevated HR) Patient left: in chair;with call bell/phone within reach;Other (comment)(chair alarm pad left in room - RN aware not in use)  OT Visit Diagnosis: Unsteadiness on feet (R26.81);Other abnormalities of gait and mobility (R26.89);Muscle weakness (generalized) (M62.81);Other symptoms and signs involving cognitive function                Time: 1415-1435 OT Time Calculation (min): 20 min Charges:  OT General Charges $OT Visit: 1 Visit OT Evaluation $OT Eval Moderate Complexity: 1 Mod G-Codes:    [name] OTR/L [place] 03/19/2018, 6:12 PM

## 2018-03-19 NOTE — Progress Notes (Signed)
OT Cancellation Note  Patient Details Name: [name] MRN: [id number] DOB: May 04, 1956   Cancelled Treatment:    Reason Eval/Treat Not Completed: Active bedrest order  [facility] Leili Eskenazi, OT/L  OT Clinical Specialist [phone]  03/19/2018, 7:19 AM

## 2018-03-20 ENCOUNTER — Encounter (HOSPITAL_COMMUNITY): Payer: Self-pay | Admitting: *Deleted

## 2018-03-20 ENCOUNTER — Inpatient Hospital Stay (HOSPITAL_COMMUNITY): Payer: PPO

## 2018-03-20 DIAGNOSIS — I161 Hypertensive emergency: Secondary | ICD-10-CM

## 2018-03-20 DIAGNOSIS — F17200 Nicotine dependence, unspecified, uncomplicated: Secondary | ICD-10-CM

## 2018-03-20 LAB — TRIGLYCERIDES: Triglycerides: 127 mg/dL (ref <150)

## 2018-03-20 MED ORDER — GADOBENATE DIMEGLUMINE 529 MG/ML IV SOLN
15.0000 mL | Freq: Once | INTRAVENOUS | Status: AC | PRN
  Administered 2018-03-20: 15 mL via INTRAVENOUS

## 2018-03-20 MED ORDER — DILTIAZEM HCL ER 60 MG PO CP12
120.0000 mg | ORAL_CAPSULE | Freq: Two times a day (BID) | ORAL | Status: DC
  Administered 2018-03-20 – 2018-03-22 (×6): 120 mg via ORAL
  Filled 2018-03-20 (×7): qty 2

## 2018-03-20 NOTE — Progress Notes (Signed)
Transfer report received from 4N ICU at 1100 and at arrived to the unit at 1145. Pt VSS; telemetry applied and verified with CCMD; second RN called to second verify. Pt oriented to the unit and room; fall/safety precaution and prevention education completed with pt at bedside; pt voices understanding and denies any questions. Bed alarm; call light within reach. Skin intact with no pressure ulcers or opened wounds noted. Pt in bed with call light within reach. Will continue to closely monitor. Dionne Bucy RN

## 2018-03-20 NOTE — Progress Notes (Signed)
Physical Therapy Treatment Patient Details Name: [name] MRN: [id number] DOB: [birth year] Today's Date: 03/20/2018    History of Present Illness  [name] is a 62 y.o. female with a history of atrial fibrillation, hypertension, CHF, COPD. CABG and previous CVA w/ L sided weakness who presents with intraventricular hemorrhage.    PT Comments    Patient progressing with mobility and able to mobilize more today with HR elevation to 130's.  Remains limited by cognition and will need SNF level rehab at d/c.   Follow Up Recommendations  SNF;Supervision/Assistance - 24 hour     Equipment Recommendations  Rolling walker with 5" wheels    Recommendations for Other Services       Precautions / Restrictions Precautions Precautions: Fall    Mobility  Bed Mobility               General bed mobility comments: up in rrom with RN  Transfers Overall transfer level: Needs assistance Equipment used: Rolling walker (2 wheeled) Transfers: Sit to/from Stand Sit to Stand: Min assist         General transfer comment: assist for balance, cues for hand placement  Ambulation/Gait Ambulation/Gait assistance: Min assist;Mod assist Gait Distance (Feet): 200 Feet Assistive device: Rolling walker (2 wheeled) Gait Pattern/deviations: Decreased stride length;Drifts right/left     General Gait Details: assist to keep walker forward, veers to R frequently, HR up to 130's with ambulation today   Stairs             Wheelchair Mobility    Modified Rankin (Stroke Patients Only)       Balance Overall balance assessment: Needs assistance   Sitting balance-Leahy Scale: Fair     Standing balance support: Bilateral upper extremity supported Standing balance-Leahy Scale: Poor Standing balance comment: UE support for balance                            Cognition Arousal/Alertness: Awake/alert Behavior During Therapy: WFL for tasks  assessed/performed Overall Cognitive Status: Impaired/Different from baseline                     Current Attention Level: Selective Memory: Decreased short-term memory   Safety/Judgement: Decreased awareness of safety;Decreased awareness of deficits   Problem Solving: Slow processing        Exercises      General Comments General comments (skin integrity, edema, etc.): son present in room and staying with pt while in chair to eat lunch      Pertinent Vitals/Pain Pain Assessment: No/denies pain    Home Living                      Prior Function            PT Goals (current goals can now be found in the care plan section) Progress towards PT goals: Progressing toward goals    Frequency    Min 3X/week      PT Plan Current plan remains appropriate    Co-evaluation              AM-PAC PT "6 Clicks" Daily Activity  Outcome Measure  Difficulty turning over in bed (including adjusting bedclothes, sheets and blankets)?: A Lot Difficulty moving from lying on back to sitting on the side of the bed? : A Lot Difficulty sitting down on and standing up from a chair with arms (e.g., wheelchair, bedside commode, etc,.)?:  Unable Help needed moving to and from a bed to chair (including a wheelchair)?: A Little Help needed walking in hospital room?: A Lot Help needed climbing 3-5 steps with a railing? : A Lot 6 Click Score: 12    End of Session Equipment Utilized During Treatment: Gait belt Activity Tolerance: Patient tolerated treatment well(but still increased HR) Patient left: with call bell/phone within reach;in chair;with family/visitor present   PT Visit Diagnosis: Unsteadiness on feet (R26.81);Other abnormalities of gait and mobility (R26.89);Muscle weakness (generalized) (M62.81);Other symptoms and signs involving the nervous system (R29.898)     Time: 0258-5277 PT Time Calculation (min) (ACUTE ONLY): 12 min  Charges:  $Gait Training:  8-22 mins                    G CodesSheran Lawless, [place] [phone] 03/20/2018    [name] 03/20/2018, 2:59 PM

## 2018-03-20 NOTE — Progress Notes (Signed)
STROKE TEAM PROGRESS NOTE    INTERVAL HISTORY No family is at the bedside.  Overall, she is feeling better.Neurologically stable. Blood pressure adequately controlled now off Cardene drip. Therapy recommends skilled nursing home for rehabilitation. MRI scan the brain shows stable appearance of the intraventricular hemorrhage without hydrocephalus  Vitals:   03/20/18 0800 03/20/18 0900 03/20/18 1000 03/20/18 1146  BP: (!) 138/97 (!) 147/114 (!) 139/98 (!) 173/102  Pulse: 98 82 91 88  Resp: (!) 23 (!) 21 (!) 22 20  Temp:    97.7 F (36.5 C)  TempSrc:    Oral  SpO2: 92% (!) 87% 98% 100%  Weight:      Height:        CBC:  Recent Labs  Lab 03/18/18 0658  WBC 8.3  NEUTROABS 6.7  HGB 14.3  HCT 42.2  MCV 98.1  PLT 296    Basic Metabolic Panel:  Recent Labs  Lab 03/18/18 0658  NA 136  K 4.1  CL 104  CO2 21*  GLUCOSE 101*  BUN 12  CREATININE 1.20*  CALCIUM 8.6*   Lipid Panel:     Component Value Date/Time   CHOL 188 10/08/2016 0803   TRIG 127 03/20/2018 0609   HDL 65.60 10/08/2016 0803   CHOLHDL 3 10/08/2016 0803   VLDL 16.6 10/08/2016 0803   LDLCALC 106 (H) 10/08/2016 0803   HgbA1c:  Lab Results  Component Value Date   HGBA1C  02/11/2011    5.4 (NOTE)                                                                       According to the ADA Clinical Practice Recommendations for 2011, when HbA1c is used as a screening test:   >=6.5%   Diagnostic of Diabetes Mellitus           (if abnormal result  is confirmed)  5.7-6.4%   Increased risk of developing Diabetes Mellitus  References:Diagnosis and Classification of Diabetes Mellitus,Diabetes Care,2011,34(Suppl 1):S62-S69 and Standards of Medical Care in         Diabetes - 2011,Diabetes Care,2011,34  (Suppl 1):S11-S61.   Urine Drug Screen:     Component Value Date/Time   LABOPIA NONE DETECTED 03/18/2018 0840   COCAINSCRNUR NONE DETECTED 03/18/2018 0840   LABBENZ NONE DETECTED 03/18/2018 0840   AMPHETMU NONE  DETECTED 03/18/2018 0840   THCU NONE DETECTED 03/18/2018 0840   LABBARB (A) 03/18/2018 0840    Result not available. Reagent lot number recalled by manufacturer.    Alcohol Level No results found for: [facility]  IMAGING Ct Head Wo Contrast  Result Date: 03/19/2018 CLINICAL DATA:  Altered mental status. Stroke with sudden slow responses EXAM: CT HEAD WITHOUT CONTRAST TECHNIQUE: Contiguous axial images were obtained from the base of the skull through the vertex without intravenous contrast. COMPARISON:  Earlier today FINDINGS: Brain: Intraventricular hemorrhage in the left more than right lateral ventricles and in the third ventricle. Stable ventricular volume without temporal horn dilatation to suggest hydrocephalus. There is a degree of ventriculomegaly from volume loss. Remote right MCA distribution infarct affecting the insula and basal ganglia. Chronic small vessel ischemic type change in the cerebral white matter. Vascular: No hyperdense vessel or unexpected calcification. Skull: Negative.  Dermal  inclusion cyst in the scalp. Sinuses/Orbits: Bilateral mastoid opacification with mastoidectomy on the left. IMPRESSION: 1. Unchanged intraventricular hemorrhage. Stable ventricular volume without hydrocephalus. 2. Remote right MCA branch infarct. Electronically Signed   By: Marnee Spring M.D.   On: 03/19/2018 19:01   Ct Head Wo Contrast  Result Date: 03/19/2018 CLINICAL DATA:  Follow up stroke. EXAM: CT HEAD WITHOUT CONTRAST TECHNIQUE: Contiguous axial images were obtained from the base of the skull through the vertex without intravenous contrast. COMPARISON:  CT HEAD March 18, 2018. FINDINGS: BRAIN: Similar intraventricular hemorrhage predominately within LEFT lateral ventricle. Moderate ventriculomegaly in the basis of parenchymal brain volume loss. No hydrocephalus. No intraparenchymal hemorrhage or acute large vascular territory infarct. Old RIGHT basal ganglia, frontotemporal infarcts. Ex vacuo  dilatation RIGHT frontal horn of the lateral ventricle. Patchy supratentorial white matter hypodensities exclusively for mention abnormality. No abnormal extra-axial fluid collections. Basal cisterns are patent. VASCULAR: Mild calcific atherosclerosis of the carotid siphons. SKULL: No skull fracture. LEFT parietal scalp scarring. Numerous subcentimeter calcified scalp tricholemmal cysts. SINUSES/ORBITS: LEFT wall up mastoidectomy. Small amount of soft tissue within the surgical bed. Small RIGHT mastoid effusion.The included ocular globes and orbital contents are non-suspicious. OTHER: None. IMPRESSION: 1. Similar intraventricular hemorrhage.  No hydrocephalus. 2. Old RIGHT MCA territory infarct. 3. Mild chronic small vessel ischemic changes. Electronically Signed   By: Awilda Metro M.D.   On: 03/19/2018 05:31   Mr Laqueta Jean ES Contrast  Result Date: 03/20/2018 CLINICAL DATA:  Headache since yesterday. Evaluate interventricular hemorrhage. History of stroke, atrial fibrillation, hypertension, hyperlipidemia. EXAM: MRI HEAD WITHOUT AND WITH CONTRAST TECHNIQUE: Multiplanar, multiecho pulse sequences of the brain and surrounding structures were obtained without and with intravenous contrast. CONTRAST:  70mL MULTIHANCE GADOBENATE DIMEGLUMINE 529 MG/ML IV SOLN COMPARISON:  CT HEAD March 19, 2018 and MRI of the head Jan 20, 2014. FINDINGS: INTRACRANIAL CONTENTS: Extensive blood products LEFT lateral ventricle with low T2 signal. Intermediate blood products layering in RIGHT occipital horn. LEFT subependymal reduced diffusion most compatible with artifact. A few punctate RIGHT cerebrum white matter foci reduced diffusion without identifiable corresponding ADC abnormality. RIGHT frontotemporal and RIGHT basal ganglia encephalomalacia with susceptibility artifact and surrounding gliosis. Ex vacuo dilatation RIGHT lateral ventricle with resultant minimal LEFT-to-RIGHT midline shift. No hydrocephalus. Old small LEFT  cerebellar infarct. RIGHT cerebral peduncle volume loss consistent with wallerian degeneration. Patchy to confluent supratentorial pontine white matter FLAIR T2 hyperintensities. No abnormal extra-axial fluid collections. No abnormal intraparenchymal or extra-axial enhancement. VASCULAR: Normal major intracranial vascular flow voids present at skull base. SKULL AND UPPER CERVICAL SPINE: No abnormal sellar expansion. No suspicious calvarial bone marrow signal. Craniocervical junction maintained. Scattered small tricholemmal cysts. SINUSES/ORBITS: Status post LEFT wall up mastoidectomy. RIGHT mastoid effusion.The included ocular globes and orbital contents are non-suspicious. OTHER: None. IMPRESSION: 1. Intraventricular hemorrhage of varying ages.  No hydrocephalus. 2. Subcentimeter acute versus subacute RIGHT cerebrum infarcts, possibly artifact. 3. Old RIGHT MCA territory infarct. 4. Old small LEFT cerebellar infarct. Moderate chronic small vessel ischemic changes. Electronically Signed   By: Awilda Metro M.D.   On: 03/20/2018 06:15    PHYSICAL EXAM Pleasant middle-aged Caucasian lady currently not in distress. . Afebrile. Head is nontraumatic. Neck is supple without bruit.    Cardiac exam no murmur or gallop. Lungs are clear to auscultation. Distal pulses are well felt. Neurological Exam : Awake alert oriented x 3 normal speech and language. Mild left lower face asymmetry. Tongue midline. No drift. Mild diminished fine finger movements on left.  Orbits right over left upper extremity. Mild left grip weak..mild left hip flexor weakness Normal sensation . Normal coordination.  ASSESSMENT/PLAN [name] is a 62 y.o. female with history of AF on Xarelto, HTN, CAD s/p CABG, HLD, prior stroke, tobacco use presenting with HA.    Stroke:  IVH secondary to HTN and Xarelto coagulopathy  xarelto reversed with Andexxa   CT head L lateral, 3rd ventricle with extension R frontal horn IVH, 3mm midline  shift. No IPH. Atrophy. Old R frontal/temporal/lentiform nucleus infarcts. Small vessel disease. Arterial vascular calcification. Ethmoid mucosal thickening. Extensive mastoid dz s/p OR.  Repeat CT head stable, old R MCA infarct. Small vessel disease.  MRI with and without contrast  1. Intraventricular hemorrhage of varying ages.  No hydrocephalus. 2. Subcentimeter acute versus subacute RIGHT cerebrum infarcts, possibly artifact. 3. Old RIGHT MCA territory infarct. 4. Old small LEFT cerebellar infarct. Moderate chronic small vessel  ischemic changes. 2D Echo  .Left ventricle: The cavity size was normal. Wall thickness was   increased in a pattern of moderate LVH. Systolic function was   normal. The estimated ejection fraction was in the range of 55%    to 60%.SCDs for VTE prophylaxis  clopidogrel 75 mg daily and Xarelto (rivaroxaban) daily (pt questions if she was taking plavix, she is not sure) prior to admission, now on No antithrombotic given IVH. Stop xarelto. Stop plavix. Add aspirin once stable; likely in 5-7 days.   Therapy recommendations: SNF  Disposition:  SNF  Ok to be OOB     Atrial Fibrillation  Home anticoagulation:  Xarelto (rivaroxaban) daily   xarelto reversed with Andexxa  . Stop xarelto . Add low dose aspirin once hemorrhage stable - p5ob 5-7 days  Hypertensive Emergency  BP 199/120 on arrival  Treated with Cleviprex  SBP goal < 140 for at least 24h (up at 12noon today)  BP within range, Stable . Home Meds: none . Add amlodipine 5 mg daily  . Long-term BP goal normotensive  Hyperlipidemia  Home meds:  Lipitor 20  LDL not checked  Will not resume statin in hospital given hemorrhage  Continue statin at discharge  Other Stroke Risk Factors  Cigarette smoker, advised to stop smoking  Hx stroke/TIA  11/2010 - R MCA infarct with residual mild L HP, embolic. TEE neg  02/2011 - TIA w/ transient dysarthria d/t decreased perfusion from  bradycardia and hypotension  Coronary artery disease s/p CABG, stents - on plavix due to CAD  Chronic HA, on topamax. Followed by Dr. Pearlean Brownie  obstructive sleep apnea   Other Active Problems  Hx tremor, now resolved. saw neurologist in [facility] day # 2    Transfer to the neurology floor bed today. Mobilize out of bed. Therapy recommends sniff.. Resumed home medications. She will need to be off anticoagulation and will start aspirin after 1 week. Long discussion the patient regarding her presentation, treatment plan and answered questions.  No family available at the bedside for discussion. I spent 25 minutes of neurocritical care time  in the care of  this patient.      Delia Heady, MD Medical Director [facility] Stroke Center Pager: [phone] 03/20/2018 3:45 PM  To contact Stroke Continuity provider, please refer to [email or web address]. After hours, contact General Neurology

## 2018-03-20 NOTE — Progress Notes (Addendum)
Progress Note  Patient Name: [name] Date of Encounter: 03/20/2018  Primary Cardiologist: Tobias Alexander, MD   Subjective   She feels cold this and and is having chills. No chest pain or SOB.   Inpatient Medications    Scheduled Meds: . buPROPion  300 mg Oral q morning - 10a  . escitalopram  10 mg Oral Daily  . famotidine  20 mg Oral Daily  . fluticasone  2 spray Each Nare Daily  . furosemide  20 mg Intravenous BID  . levothyroxine  100 mcg Oral QAC breakfast  . magnesium oxide  400 mg Oral Daily  . pantoprazole  40 mg Oral Daily  . senna-docusate  1 tablet Oral BID  . topiramate  50 mg Oral BID   Continuous Infusions: . sodium chloride 75 mL/hr at 03/20/18 0700  . clevidipine 3 mg/hr (03/20/18 0700)  . diltiazem (CARDIZEM) infusion     PRN Meds: acetaminophen **OR** acetaminophen (TYLENOL) oral liquid 160 mg/5 mL **OR** acetaminophen, ipratropium-albuterol, labetalol, ondansetron (ZOFRAN) IV   Vital Signs    Vitals:   03/20/18 0530 03/20/18 0600 03/20/18 0630 03/20/18 0756  BP: 127/85 (!) 143/110 127/88   Pulse:   87   Resp:   (!) 21   Temp:    97.8 F (36.6 C)  TempSrc:    Oral  SpO2:   92%   Weight:      Height:        Intake/Output Summary (Last 24 hours) at 03/20/2018 0801 Last data filed at 03/20/2018 0700 Gross per 24 hour  Intake 2010.55 ml  Output 351 ml  Net 1659.55 ml   Filed Weights   03/18/18 0637 03/18/18 1000  Weight: 218 lb (98.9 kg) 171 lb 4.8 oz (77.7 kg)    Telemetry    A-fib VR 90-110 - Personally Reviewed  Physical Exam  Feeling cold GEN: No acute distress.   Neck: No JVD Cardiac: iRRR, no murmurs, rubs, or gallops.  Respiratory: Clear to auscultation bilaterally. GI: Soft, nontender, non-distended  MS: No edema; No deformity. Neuro:  Nonfocal  Psych: Normal affect   Labs    Chemistry Recent Labs  Lab 03/18/18 0658  NA 136  K 4.1  CL 104  CO2 21*  GLUCOSE 101*  BUN 12  CREATININE 1.20*  CALCIUM 8.6*    PROT 7.7  ALBUMIN 4.0  AST 23  ALT 9  ALKPHOS 64  BILITOT 1.4*  GFRNONAA 48*  GFRAA 56*  ANIONGAP 11     Hematology Recent Labs  Lab 03/18/18 0658  WBC 8.3  RBC 4.30  HGB 14.3  HCT 42.2  MCV 98.1  MCH 33.3  MCHC 33.9  RDW 13.6  PLT 296    Cardiac EnzymesNo results for input(s): TROPONINI in the last 168 hours.  Recent Labs  Lab 03/18/18 0705  TROPIPOC 0.03     BNPNo results for input(s): BNP, PROBNP in the last 168 hours.   DDimer No results for input(s): DDIMER in the last 168 hours.   Radiology    Ct Head Wo Contrast  Result Date: 03/19/2018 CLINICAL DATA:  Altered mental status. Stroke with sudden slow responses EXAM: CT HEAD WITHOUT CONTRAST TECHNIQUE: Contiguous axial images were obtained from the base of the skull through the vertex without intravenous contrast. COMPARISON:  Earlier today FINDINGS: Brain: Intraventricular hemorrhage in the left more than right lateral ventricles and in the third ventricle. Stable ventricular volume without temporal horn dilatation to suggest hydrocephalus. There is a  degree of ventriculomegaly from volume loss. Remote right MCA distribution infarct affecting the insula and basal ganglia. Chronic small vessel ischemic type change in the cerebral white matter. Vascular: No hyperdense vessel or unexpected calcification. Skull: Negative.  Dermal inclusion cyst in the scalp. Sinuses/Orbits: Bilateral mastoid opacification with mastoidectomy on the left. IMPRESSION: 1. Unchanged intraventricular hemorrhage. Stable ventricular volume without hydrocephalus. 2. Remote right MCA branch infarct. Electronically Signed   By: Marnee Spring M.D.   On: 03/19/2018 19:01   Ct Head Wo Contrast  Result Date: 03/19/2018 CLINICAL DATA:  Follow up stroke. EXAM: CT HEAD WITHOUT CONTRAST TECHNIQUE: Contiguous axial images were obtained from the base of the skull through the vertex without intravenous contrast. COMPARISON:  CT HEAD March 18, 2018.  FINDINGS: BRAIN: Similar intraventricular hemorrhage predominately within LEFT lateral ventricle. Moderate ventriculomegaly in the basis of parenchymal brain volume loss. No hydrocephalus. No intraparenchymal hemorrhage or acute large vascular territory infarct. Old RIGHT basal ganglia, frontotemporal infarcts. Ex vacuo dilatation RIGHT frontal horn of the lateral ventricle. Patchy supratentorial white matter hypodensities exclusively for mention abnormality. No abnormal extra-axial fluid collections. Basal cisterns are patent. VASCULAR: Mild calcific atherosclerosis of the carotid siphons. SKULL: No skull fracture. LEFT parietal scalp scarring. Numerous subcentimeter calcified scalp tricholemmal cysts. SINUSES/ORBITS: LEFT wall up mastoidectomy. Small amount of soft tissue within the surgical bed. Small RIGHT mastoid effusion.The included ocular globes and orbital contents are non-suspicious. OTHER: None. IMPRESSION: 1. Similar intraventricular hemorrhage.  No hydrocephalus. 2. Old RIGHT MCA territory infarct. 3. Mild chronic small vessel ischemic changes. Electronically Signed   By: Awilda Metro M.D.   On: 03/19/2018 05:31   Mr Laqueta Jean ZO Contrast  Result Date: 03/20/2018 CLINICAL DATA:  Headache since yesterday. Evaluate interventricular hemorrhage. History of stroke, atrial fibrillation, hypertension, hyperlipidemia. EXAM: MRI HEAD WITHOUT AND WITH CONTRAST TECHNIQUE: Multiplanar, multiecho pulse sequences of the brain and surrounding structures were obtained without and with intravenous contrast. CONTRAST:  85mL MULTIHANCE GADOBENATE DIMEGLUMINE 529 MG/ML IV SOLN COMPARISON:  CT HEAD March 19, 2018 and MRI of the head Jan 20, 2014. FINDINGS: INTRACRANIAL CONTENTS: Extensive blood products LEFT lateral ventricle with low T2 signal. Intermediate blood products layering in RIGHT occipital horn. LEFT subependymal reduced diffusion most compatible with artifact. A few punctate RIGHT cerebrum white matter  foci reduced diffusion without identifiable corresponding ADC abnormality. RIGHT frontotemporal and RIGHT basal ganglia encephalomalacia with susceptibility artifact and surrounding gliosis. Ex vacuo dilatation RIGHT lateral ventricle with resultant minimal LEFT-to-RIGHT midline shift. No hydrocephalus. Old small LEFT cerebellar infarct. RIGHT cerebral peduncle volume loss consistent with wallerian degeneration. Patchy to confluent supratentorial pontine white matter FLAIR T2 hyperintensities. No abnormal extra-axial fluid collections. No abnormal intraparenchymal or extra-axial enhancement. VASCULAR: Normal major intracranial vascular flow voids present at skull base. SKULL AND UPPER CERVICAL SPINE: No abnormal sellar expansion. No suspicious calvarial bone marrow signal. Craniocervical junction maintained. Scattered small tricholemmal cysts. SINUSES/ORBITS: Status post LEFT wall up mastoidectomy. RIGHT mastoid effusion.The included ocular globes and orbital contents are non-suspicious. OTHER: None. IMPRESSION: 1. Intraventricular hemorrhage of varying ages.  No hydrocephalus. 2. Subcentimeter acute versus subacute RIGHT cerebrum infarcts, possibly artifact. 3. Old RIGHT MCA territory infarct. 4. Old small LEFT cerebellar infarct. Moderate chronic small vessel ischemic changes. Electronically Signed   By: Awilda Metro M.D.   On: 03/20/2018 06:15    Cardiac Studies     Patient Profile     62 y.o. female   Assessment & Plan    1. Acute hemorrhagic stroke 2.  Atrial fibrillation with RVR with known history of PAF: 3.  Coronary artery disease s/p CABG and subsequent stenting of the native LAD (LIMA-LAD found to be atretic): 4.  Recent CVA: 5.  Essential hypertension with hypertensive emergency: 6.  Hyperlipidemia: 7.  Hypothyroidism: 8.  Tobacco abuse:  62 y.o.femalewith a hx of CAD s/p CABG and subsequent stenting of the native LAD (LIMA-LAD found to be atretic), PAF (on amiodarone and  chronic Xarelto), chronic combined systolic and diastolic HF (EF 16% in 2015 >>improved to 65-70% in 3/16), HTN, HL, CKD, OSA, CVA in 2012, COPD, chronic ongoing smoker who was admitted with severe headaches and was diagnosed with acute hemorrhagic stroke CT head shows focal hemorrhage filling most of the left lateral and third ventricles with 3 mm of midline shift towards the right.  Neurosurgery decided not to perform intervention given reassuring neurological exam, her MRI is pending.  She was given reversal agent Andexxa.  Her home meds Plavix and Xarelto were both held.  On physical exam her blood pressure is elevated in 140s, her heart rate is in low 100s she is in atrial fibrillation, she is not in acute distress denies any chest pain or shortness of breath, she has chronic facial droop post prior stroke, she is wheezing bilaterally has irregular heart and rhythm no significant murmurs, mild trach crackles at both bases, and no lower extremity edema.  Echocardiogram showed LVEF 55 to 60% and only mildly dilated left atrium.  I agree with reversal agent, and holding Xarelto and Plavix, we will follow closely.  We ordered  Cardizem drip however it was never started, her ventricular rates are better today in 90'-110', I would start cardizem SR 120 mg po BID, hold amlodipine. Goal is to keep systolic blood pressure under 109 and heart rate under 100.  Based on requirements we will then switch to long-acting Cardizem p.o.  I would focus on weaning off Clevidipine today.  Continue breathing treatment for wheezing that is improved today however O2 sats in low 90'.  She will have to definitely quit smoking. She appears euvolemic, I would d/c Lasix.   For questions or updates, please contact [facility] Please consult [email or web address] for contact info under Cardiology/STEMI.   Signed, Tobias Alexander, MD  03/20/2018, 8:01 AM

## 2018-03-21 ENCOUNTER — Inpatient Hospital Stay (HOSPITAL_COMMUNITY): Payer: PPO

## 2018-03-21 DIAGNOSIS — I615 Nontraumatic intracerebral hemorrhage, intraventricular: Secondary | ICD-10-CM

## 2018-03-21 DIAGNOSIS — I481 Persistent atrial fibrillation: Secondary | ICD-10-CM

## 2018-03-21 LAB — CBC
HEMATOCRIT: 38.7 % (ref 36.0–46.0)
Hemoglobin: 13.1 g/dL (ref 12.0–15.0)
MCH: 33.6 pg (ref 26.0–34.0)
MCHC: 33.9 g/dL (ref 30.0–36.0)
MCV: 99.2 fL (ref 78.0–100.0)
PLATELETS: 201 10*3/uL (ref 150–400)
RBC: 3.9 MIL/uL (ref 3.87–5.11)
RDW: 13.2 % (ref 11.5–15.5)
WBC: 6 10*3/uL (ref 4.0–10.5)

## 2018-03-21 LAB — LIPID PANEL
CHOLESTEROL: 223 mg/dL — AB (ref 0–200)
HDL: 48 mg/dL (ref >40)
LDL Cholesterol: 148 mg/dL — ABNORMAL HIGH (ref 0–99)
Total CHOL/HDL Ratio: 4.6 RATIO
Triglycerides: 137 mg/dL (ref <150)
VLDL: 27 mg/dL (ref 0–40)

## 2018-03-21 LAB — BASIC METABOLIC PANEL
ANION GAP: 10 (ref 5–15)
BUN: 17 mg/dL (ref 6–20)
CALCIUM: 8.7 mg/dL — AB (ref 8.9–10.3)
CHLORIDE: 102 mmol/L (ref 98–111)
CO2: 23 mmol/L (ref 22–32)
CREATININE: 1.35 mg/dL — AB (ref 0.44–1.00)
GFR calc Af Amer: 48 mL/min — ABNORMAL LOW (ref >60)
GFR calc non Af Amer: 42 mL/min — ABNORMAL LOW (ref >60)
GLUCOSE: 91 mg/dL (ref 70–99)
POTASSIUM: 2.9 mmol/L — AB (ref 3.5–5.1)
Sodium: 135 mmol/L (ref 135–145)

## 2018-03-21 LAB — TSH: TSH: 102.301 u[IU]/mL — ABNORMAL HIGH (ref 0.350–4.500)

## 2018-03-21 LAB — HEMOGLOBIN A1C
Hgb A1c MFr Bld: 5.1 % (ref 4.8–5.6)
Mean Plasma Glucose: 99.67 mg/dL

## 2018-03-21 LAB — VITAMIN B12: Vitamin B-12: 261 pg/mL (ref 180–914)

## 2018-03-21 MED ORDER — ACETAMINOPHEN 325 MG PO TABS
650.0000 mg | ORAL_TABLET | Freq: Four times a day (QID) | ORAL | Status: DC | PRN
  Administered 2018-03-22: 650 mg via ORAL
  Filled 2018-03-21: qty 2

## 2018-03-21 MED ORDER — LISINOPRIL 20 MG PO TABS
20.0000 mg | ORAL_TABLET | Freq: Every day | ORAL | Status: DC
  Administered 2018-03-21 – 2018-03-22 (×2): 20 mg via ORAL
  Filled 2018-03-21 (×2): qty 1

## 2018-03-21 MED ORDER — LEVOTHYROXINE SODIUM 100 MCG PO TABS
200.0000 ug | ORAL_TABLET | Freq: Every day | ORAL | Status: DC
  Administered 2018-03-22 – 2018-03-24 (×3): 200 ug via ORAL
  Filled 2018-03-21 (×3): qty 2

## 2018-03-21 MED ORDER — ATORVASTATIN CALCIUM 10 MG PO TABS
20.0000 mg | ORAL_TABLET | Freq: Every day | ORAL | Status: DC
  Administered 2018-03-21: 20 mg via ORAL
  Filled 2018-03-21: qty 2

## 2018-03-21 MED ORDER — VITAMIN B-12 1000 MCG PO TABS
1000.0000 ug | ORAL_TABLET | Freq: Every day | ORAL | Status: DC
  Administered 2018-03-21 – 2018-03-24 (×4): 1000 ug via ORAL
  Filled 2018-03-21 (×4): qty 1

## 2018-03-21 MED ORDER — SODIUM CHLORIDE 0.9 % IV SOLN
INTRAVENOUS | Status: DC
  Administered 2018-03-21 – 2018-03-23 (×4): via INTRAVENOUS

## 2018-03-21 MED ORDER — BUTALBITAL-APAP-CAFFEINE 50-325-40 MG PO TABS
1.0000 | ORAL_TABLET | Freq: Three times a day (TID) | ORAL | Status: DC | PRN
  Administered 2018-03-21 – 2018-03-24 (×9): 1 via ORAL
  Filled 2018-03-21 (×9): qty 1

## 2018-03-21 NOTE — Plan of Care (Signed)
  Problem: Education: Goal: Knowledge of General Education information will improve Outcome: Progressing   Problem: Safety: Goal: Ability to remain free from injury will improve Outcome: Progressing   

## 2018-03-21 NOTE — Progress Notes (Signed)
STROKE TEAM PROGRESS NOTE    INTERVAL HISTORY No family is at the bedside.  Pt awake alert and conversing well. Stated that she has b/l temporal HA and tylenol not effective. Cough made it worse. MRI done yesterday showed stable IVH.   Vitals:   03/20/18 1620 03/20/18 1855 03/21/18 0018 03/21/18 0312  BP: (!) 132/98 (!) 126/101 111/88 (!) 129/91  Pulse: (!) 33 91 70 82  Resp: 19 18 18 18   Temp: 98.3 F (36.8 C) 98.7 F (37.1 C) 98.7 F (37.1 C) 98.4 F (36.9 C)  TempSrc: Oral Oral Oral Oral  SpO2: 96% 98% 97% 100%  Weight:      Height:        CBC:  Recent Labs  Lab 03/18/18 0658 03/21/18 0650  WBC 8.3 6.0  NEUTROABS 6.7  --   HGB 14.3 13.1  HCT 42.2 38.7  MCV 98.1 99.2  PLT 296 201    Basic Metabolic Panel:  Recent Labs  Lab 03/18/18 0658  NA 136  K 4.1  CL 104  CO2 21*  GLUCOSE 101*  BUN 12  CREATININE 1.20*  CALCIUM 8.6*   Lipid Panel:     Component Value Date/Time   CHOL 188 10/08/2016 0803   TRIG 127 03/20/2018 0609   HDL 65.60 10/08/2016 0803   CHOLHDL 3 10/08/2016 0803   VLDL 16.6 10/08/2016 0803   LDLCALC 106 (H) 10/08/2016 0803   HgbA1c:  Lab Results  Component Value Date   HGBA1C 5.1 03/21/2018   Urine Drug Screen:     Component Value Date/Time   LABOPIA NONE DETECTED 03/18/2018 0840   COCAINSCRNUR NONE DETECTED 03/18/2018 0840   LABBENZ NONE DETECTED 03/18/2018 0840   AMPHETMU NONE DETECTED 03/18/2018 0840   THCU NONE DETECTED 03/18/2018 0840   LABBARB (A) 03/18/2018 0840    Result not available. Reagent lot number recalled by manufacturer.    Alcohol Level No results found for: [facility]  IMAGING I have personally reviewed the radiological images below and agree with the radiology interpretations.  Ct Head Wo Contrast 03/19/2018  IMPRESSION:  1. Unchanged intraventricular hemorrhage. Stable ventricular volume without hydrocephalus.  2. Remote right MCA branch infarct.   Mr Lodema Pilot Contrast 03/20/2018 IMPRESSION:  1.  Intraventricular hemorrhage of varying ages.  No hydrocephalus.  2. Subcentimeter acute versus subacute RIGHT cerebrum infarcts, possibly artifact.  3. Old RIGHT MCA territory infarct.  4. Old small LEFT cerebellar infarct. Moderate chronic small vessel ischemic changes.   MRA pending  CUS - 1-39% ICA plaquing. Vertebral artery flow is antegrade.      PHYSICAL EXAM Vitals:   03/21/18 0018 03/21/18 0312 03/21/18 0800 03/21/18 1140  BP: 111/88 (!) 129/91 (!) 139/93 (!) 159/98  Pulse: 70 82 77 78  Resp: 18 18 18 18   Temp: 98.7 F (37.1 C) 98.4 F (36.9 C) 97.6 F (36.4 C) 98.6 F (37 C)  TempSrc: Oral Oral Oral Oral  SpO2: 97% 100% 100% 97%  Weight:      Height:        Pleasant middle-aged Caucasian lady currently not in distress. . Afebrile. Head is nontraumatic. Neck is supple without bruit.    Cardiac exam no murmur or gallop. Lungs are clear to auscultation. Distal pulses are well felt. Neurological Exam : Awake alert oriented x 3 normal mild dysarthria. PERRL, EOMI, visual field full. Mild left lower face asymmetry. Tongue midline. No drift. Mild diminished fine finger movements on left. Orbits right over left upper extremity.  Mild left grip weak..mild left hip flexor weakness Normal sensation . Normal coordination.   ASSESSMENT/PLAN Ms. ADRIHANNA KARON is a 62 y.o. female with history of AF on Xarelto, HTN, CAD s/p CABG, HLD, prior stroke, and tobacco use presenting with HA.   IVH - secondary to HTN and Xarelto coagulopathy - Xarelto reversed with Andexxa   CT head - L lateral, 3rd ventricle with extension R frontal horn IVH, 43mm midline shift. Old R frontal/temporal/lentiform nucleus infarcts.   Repeat CT head - stable, old R MCA infarct.   MRI with and without contrast - stable IVH, No hydrocephalus. Old RIGHT MCA territory infarct. Old small LEFT cerebellar infarct.   2D Echo - EF 55% to 60%.  SCDs for VTE prophylaxis  clopidogrel 75 mg daily and Xarelto  (rivaroxaban) daily (pt questions if she was taking plavix, she is not sure) prior to admission, now on No antithrombotic given IVH. Will consider to start aspirin once stable, likely in 5-7 days. Follow up CT in 3-4 weeks, if IVH resolves, consider to resume DOACs with eliquis.   Therapy recommendations: SNF  Disposition:  pending  Atrial Fibrillation  Home anticoagulation:  Xarelto (rivaroxaban) daily   Xarelto reversed with Andexxa   On cardizem for rate control  Follow up CT in 3-4 weeks, if IVH resolves, consider to resume DOACs with eliquis.   Hypertensive Emergency  BP 199/120 on arrival  off Cleviprex . Home Meds: none . On lisinopril now . Long-term BP goal normotensive  Hyperlipidemia  Home meds:  Lipitor 20  LDL 148  On lipitor 40  Continue statin at discharge  Tobacco abuse  Current smoker  Smoking cessation counseling provided  Nicotine patch allergy  Pt is willing to quit  Other Stroke Risk Factors  Advanced age  Hx stroke/TIA  11/2010 - R MCA infarct with residual mild L HP, embolic. TEE neg  02/2011 - TIA w/ transient dysarthria d/t decreased perfusion from bradycardia and hypotension  Coronary artery disease s/p CABG, stents - on plavix due to CAD  Chronic HA, on topamax. Followed by Dr. Pearlean Brownie  Obstructive sleep apnea   Other Active Problems  Hx tremor, now resolved. saw neurologist in [facility] day # 3  Marvel Plan, MD PhD Stroke Neurology 03/22/2018 12:22 AM  To contact Stroke Continuity provider, please refer to [email or web address]. After hours, contact General Neurology

## 2018-03-21 NOTE — Progress Notes (Signed)
VASCULAR LAB PRELIMINARY  PRELIMINARY  PRELIMINARY  PRELIMINARY  Carotid duplex completed.    Preliminary report:  1-39% ICA plaquing. Vertebral artery flow is antegrade.   Casson Catena, RVT 03/21/2018, 4:57 PM

## 2018-03-21 NOTE — Progress Notes (Signed)
Progress Note  Patient Name: [name] Date of Encounter: 03/21/2018  Primary Cardiologist: Tobias Alexander, MD   Subjective   No chest pain or sob or palpitations.  Inpatient Medications    Scheduled Meds: . buPROPion  300 mg Oral q morning - 10a  . diltiazem  120 mg Oral Q12H  . escitalopram  10 mg Oral Daily  . famotidine  20 mg Oral Daily  . fluticasone  2 spray Each Nare Daily  . levothyroxine  100 mcg Oral QAC breakfast  . magnesium oxide  400 mg Oral Daily  . pantoprazole  40 mg Oral Daily  . senna-docusate  1 tablet Oral BID  . topiramate  50 mg Oral BID   Continuous Infusions:  PRN Meds: acetaminophen **OR** [DISCONTINUED] acetaminophen (TYLENOL) oral liquid 160 mg/5 mL **OR** [DISCONTINUED] acetaminophen, butalbital-acetaminophen-caffeine, ipratropium-albuterol, labetalol, ondansetron (ZOFRAN) IV   Vital Signs    Vitals:   03/21/18 0018 03/21/18 0312 03/21/18 0800 03/21/18 1140  BP: 111/88 (!) 129/91 (!) 139/93 (!) 159/98  Pulse: 70 82 77 78  Resp: 18 18 18 18   Temp: 98.7 F (37.1 C) 98.4 F (36.9 C) 97.6 F (36.4 C) 98.6 F (37 C)  TempSrc: Oral Oral Oral Oral  SpO2: 97% 100% 100% 97%  Weight:      Height:        Intake/Output Summary (Last 24 hours) at 03/21/2018 1247 Last data filed at 03/21/2018 0700 Gross per 24 hour  Intake 240 ml  Output -  Net 240 ml   Filed Weights   03/18/18 0637 03/18/18 1000  Weight: 218 lb (98.9 kg) 171 lb 4.8 oz (77.7 kg)    Telemetry    Atrial fib - Personally Reviewed  ECG    none - Personally Reviewed  Physical Exam   GEN: No acute distress.   Neck: No JVD Cardiac: IRIRR, no murmurs, rubs, or gallops.  Respiratory: Clear to auscultation bilaterally. GI: Soft, nontender, non-distended  MS: No edema; No deformity. Neuro:  Nonfocal  Psych: Normal affect   Labs    Chemistry Recent Labs  Lab 03/18/18 0658 03/21/18 0650  NA 136 135  K 4.1 2.9*  CL 104 102  CO2 21* 23  GLUCOSE 101* 91    BUN 12 17  CREATININE 1.20* 1.35*  CALCIUM 8.6* 8.7*  PROT 7.7  --   ALBUMIN 4.0  --   AST 23  --   ALT 9  --   ALKPHOS 64  --   BILITOT 1.4*  --   GFRNONAA 48* 42*  GFRAA 56* 48*  ANIONGAP 11 10     Hematology Recent Labs  Lab 03/18/18 0658 03/21/18 0650  WBC 8.3 6.0  RBC 4.30 3.90  HGB 14.3 13.1  HCT 42.2 38.7  MCV 98.1 99.2  MCH 33.3 33.6  MCHC 33.9 33.9  RDW 13.6 13.2  PLT 296 201    Cardiac EnzymesNo results for input(s): TROPONINI in the last 168 hours.  Recent Labs  Lab 03/18/18 0705  TROPIPOC 0.03     BNPNo results for input(s): BNP, PROBNP in the last 168 hours.   DDimer No results for input(s): DDIMER in the last 168 hours.   Radiology    Ct Head Wo Contrast  Result Date: 03/19/2018 CLINICAL DATA:  Altered mental status. Stroke with sudden slow responses EXAM: CT HEAD WITHOUT CONTRAST TECHNIQUE: Contiguous axial images were obtained from the base of the skull through the vertex without intravenous contrast. COMPARISON:  Earlier today  FINDINGS: Brain: Intraventricular hemorrhage in the left more than right lateral ventricles and in the third ventricle. Stable ventricular volume without temporal horn dilatation to suggest hydrocephalus. There is a degree of ventriculomegaly from volume loss. Remote right MCA distribution infarct affecting the insula and basal ganglia. Chronic small vessel ischemic type change in the cerebral white matter. Vascular: No hyperdense vessel or unexpected calcification. Skull: Negative.  Dermal inclusion cyst in the scalp. Sinuses/Orbits: Bilateral mastoid opacification with mastoidectomy on the left. IMPRESSION: 1. Unchanged intraventricular hemorrhage. Stable ventricular volume without hydrocephalus. 2. Remote right MCA branch infarct. Electronically Signed   By: Marnee Spring M.D.   On: 03/19/2018 19:01   Mr Laqueta Jean ZO Contrast  Result Date: 03/20/2018 CLINICAL DATA:  Headache since yesterday. Evaluate interventricular  hemorrhage. History of stroke, atrial fibrillation, hypertension, hyperlipidemia. EXAM: MRI HEAD WITHOUT AND WITH CONTRAST TECHNIQUE: Multiplanar, multiecho pulse sequences of the brain and surrounding structures were obtained without and with intravenous contrast. CONTRAST:  15mL MULTIHANCE GADOBENATE DIMEGLUMINE 529 MG/ML IV SOLN COMPARISON:  CT HEAD March 19, 2018 and MRI of the head Jan 20, 2014. FINDINGS: INTRACRANIAL CONTENTS: Extensive blood products LEFT lateral ventricle with low T2 signal. Intermediate blood products layering in RIGHT occipital horn. LEFT subependymal reduced diffusion most compatible with artifact. A few punctate RIGHT cerebrum white matter foci reduced diffusion without identifiable corresponding ADC abnormality. RIGHT frontotemporal and RIGHT basal ganglia encephalomalacia with susceptibility artifact and surrounding gliosis. Ex vacuo dilatation RIGHT lateral ventricle with resultant minimal LEFT-to-RIGHT midline shift. No hydrocephalus. Old small LEFT cerebellar infarct. RIGHT cerebral peduncle volume loss consistent with wallerian degeneration. Patchy to confluent supratentorial pontine white matter FLAIR T2 hyperintensities. No abnormal extra-axial fluid collections. No abnormal intraparenchymal or extra-axial enhancement. VASCULAR: Normal major intracranial vascular flow voids present at skull base. SKULL AND UPPER CERVICAL SPINE: No abnormal sellar expansion. No suspicious calvarial bone marrow signal. Craniocervical junction maintained. Scattered small tricholemmal cysts. SINUSES/ORBITS: Status post LEFT wall up mastoidectomy. RIGHT mastoid effusion.The included ocular globes and orbital contents are non-suspicious. OTHER: None. IMPRESSION: 1. Intraventricular hemorrhage of varying ages.  No hydrocephalus. 2. Subcentimeter acute versus subacute RIGHT cerebrum infarcts, possibly artifact. 3. Old RIGHT MCA territory infarct. 4. Old small LEFT cerebellar infarct. Moderate chronic  small vessel ischemic changes. Electronically Signed   By: Awilda Metro M.D.   On: 03/20/2018 06:15    Cardiac Studies   none  Patient Profile     62 y.o. female admitted with hemorrhagic stroke in setting of known atrial fib.   Assessment & Plan  1. Atrial fib - restart xarelto when [facility] with Neuro service.  2. HTN - her blood pressure is up a bit. Consider uptitration of her calcium channel blocker 3. Hypokalemia - replete. She will need at least 80 meq.   Loreta Blouch,M.D.     For questions or updates, please contact [facility] Please consult [email or web address] for contact info under Cardiology/STEMI.      Signed, Lewayne Bunting, MD  03/21/2018, 12:47 PM  Patient ID: [name], female   DOB: May 26, 1956, 62 y.o.   MRN: [id number]

## 2018-03-22 ENCOUNTER — Inpatient Hospital Stay (HOSPITAL_COMMUNITY): Payer: PPO

## 2018-03-22 DIAGNOSIS — E785 Hyperlipidemia, unspecified: Secondary | ICD-10-CM

## 2018-03-22 DIAGNOSIS — I25709 Atherosclerosis of coronary artery bypass graft(s), unspecified, with unspecified angina pectoris: Secondary | ICD-10-CM

## 2018-03-22 DIAGNOSIS — I429 Cardiomyopathy, unspecified: Secondary | ICD-10-CM

## 2018-03-22 DIAGNOSIS — F39 Unspecified mood [affective] disorder: Secondary | ICD-10-CM

## 2018-03-22 DIAGNOSIS — E782 Mixed hyperlipidemia: Secondary | ICD-10-CM

## 2018-03-22 DIAGNOSIS — I5032 Chronic diastolic (congestive) heart failure: Secondary | ICD-10-CM

## 2018-03-22 DIAGNOSIS — Z8673 Personal history of transient ischemic attack (TIA), and cerebral infarction without residual deficits: Secondary | ICD-10-CM

## 2018-03-22 DIAGNOSIS — G4733 Obstructive sleep apnea (adult) (pediatric): Secondary | ICD-10-CM

## 2018-03-22 LAB — CBC
HEMATOCRIT: 35.7 % — AB (ref 36.0–46.0)
HEMOGLOBIN: 12.2 g/dL (ref 12.0–15.0)
MCH: 33.6 pg (ref 26.0–34.0)
MCHC: 34.2 g/dL (ref 30.0–36.0)
MCV: 98.3 fL (ref 78.0–100.0)
Platelets: 195 10*3/uL (ref 150–400)
RBC: 3.63 MIL/uL — AB (ref 3.87–5.11)
RDW: 13.2 % (ref 11.5–15.5)
WBC: 5.4 10*3/uL (ref 4.0–10.5)

## 2018-03-22 LAB — GLUCOSE, CAPILLARY
GLUCOSE-CAPILLARY: 98 mg/dL (ref 70–99)
Glucose-Capillary: 99 mg/dL (ref 70–99)
Glucose-Capillary: 93 mg/dL (ref 70–99)

## 2018-03-22 LAB — BASIC METABOLIC PANEL
Anion gap: 8 (ref 5–15)
BUN: 14 mg/dL (ref 6–20)
CHLORIDE: 108 mmol/L (ref 98–111)
CO2: 23 mmol/L (ref 22–32)
CREATININE: 1.24 mg/dL — AB (ref 0.44–1.00)
Calcium: 8.2 mg/dL — ABNORMAL LOW (ref 8.9–10.3)
GFR calc Af Amer: 54 mL/min — ABNORMAL LOW (ref >60)
GFR calc non Af Amer: 46 mL/min — ABNORMAL LOW (ref >60)
GLUCOSE: 94 mg/dL (ref 70–99)
POTASSIUM: 2.6 mmol/L — AB (ref 3.5–5.1)
Sodium: 139 mmol/L (ref 135–145)

## 2018-03-22 LAB — POTASSIUM: POTASSIUM: 4.3 mmol/L (ref 3.5–5.1)

## 2018-03-22 MED ORDER — ATORVASTATIN CALCIUM 40 MG PO TABS
40.0000 mg | ORAL_TABLET | Freq: Every day | ORAL | Status: DC
  Administered 2018-03-22 – 2018-03-24 (×3): 40 mg via ORAL
  Filled 2018-03-22 (×3): qty 1

## 2018-03-22 MED ORDER — POTASSIUM CHLORIDE CRYS ER 20 MEQ PO TBCR
40.0000 meq | EXTENDED_RELEASE_TABLET | ORAL | Status: AC
  Administered 2018-03-22 (×3): 40 meq via ORAL
  Filled 2018-03-22 (×3): qty 2

## 2018-03-22 MED ORDER — POTASSIUM CHLORIDE 10 MEQ/100ML IV SOLN
10.0000 meq | INTRAVENOUS | Status: AC
  Administered 2018-03-22 (×4): 10 meq via INTRAVENOUS
  Filled 2018-03-22 (×4): qty 100

## 2018-03-22 NOTE — Progress Notes (Signed)
CRITICAL VALUE ALERT  Critical Value:  Potassium 2.6  Date & Time Notied: 0652 Provider Notified: On call Neuologist  Orders Received/Actions taken: Waiting for response

## 2018-03-22 NOTE — Progress Notes (Signed)
STROKE TEAM PROGRESS NOTE   INTERVAL HISTORY No family is at the bedside.  Pt awake alert and conversing well. Sitting in bed, still has mild HA but better than yesterday. Low K+ with supplement. Pending SNF   Vitals:   03/22/18 0001 03/22/18 0447 03/22/18 0745 03/22/18 1156  BP: (!) 136/92 108/74 125/84 (!) 138/98  Pulse: 69  64 69  Resp: 18  18 18   Temp: 97.9 F (36.6 C) 98 F (36.7 C) 98.8 F (37.1 C) 98.1 F (36.7 C)  TempSrc: Oral Oral Oral Oral  SpO2: 97% 97% 97% 99%  Weight:      Height:        CBC:  Recent Labs  Lab 03/18/18 0658 03/21/18 0650 03/22/18 0414  WBC 8.3 6.0 5.4  NEUTROABS 6.7  --   --   HGB 14.3 13.1 12.2  HCT 42.2 38.7 35.7*  MCV 98.1 99.2 98.3  PLT 296 201 195    Basic Metabolic Panel:  Recent Labs  Lab 03/21/18 0650 03/22/18 0414  NA 135 139  K 2.9* 2.6*  CL 102 108  CO2 23 23  GLUCOSE 91 94  BUN 17 14  CREATININE 1.35* 1.24*  CALCIUM 8.7* 8.2*   Lipid Panel:     Component Value Date/Time   CHOL 223 (H) 03/21/2018 0650   TRIG 137 03/21/2018 0650   HDL 48 03/21/2018 0650   CHOLHDL 4.6 03/21/2018 0650   VLDL 27 03/21/2018 0650   LDLCALC 148 (H) 03/21/2018 0650   HgbA1c:  Lab Results  Component Value Date   HGBA1C 5.1 03/21/2018   Urine Drug Screen:     Component Value Date/Time   LABOPIA NONE DETECTED 03/18/2018 0840   COCAINSCRNUR NONE DETECTED 03/18/2018 0840   LABBENZ NONE DETECTED 03/18/2018 0840   AMPHETMU NONE DETECTED 03/18/2018 0840   THCU NONE DETECTED 03/18/2018 0840   LABBARB (A) 03/18/2018 0840    Result not available. Reagent lot number recalled by manufacturer.    Alcohol Level No results found for: [facility]  IMAGING I have personally reviewed the radiological images below and agree with the radiology interpretations.  Ct Head Wo Contrast 03/19/2018  IMPRESSION:  1. Unchanged intraventricular hemorrhage. Stable ventricular volume without hydrocephalus.  2. Remote right MCA branch infarct.   Mr Lodema Pilot Contrast 03/20/2018 IMPRESSION:  1. Intraventricular hemorrhage of varying ages.  No hydrocephalus.  2. Subcentimeter acute versus subacute RIGHT cerebrum infarcts, possibly artifact.  3. Old RIGHT MCA territory infarct.  4. Old small LEFT cerebellar infarct. Moderate chronic small vessel ischemic changes.   MR MRA Head WO Contrast 03/22/2018 IMPRESSION: 1. No aneurysm, large vessel occlusion or high-grade stenosis. 2. Unchanged distribution of intraventricular blood, predominantly located in the left lateral ventricle. Size and configuration of the ventricles are unchanged.  CUS - 1-39% ICA plaquing. Vertebral artery flow is antegrade.    PHYSICAL EXAM Vitals:   03/22/18 0001 03/22/18 0447 03/22/18 0745 03/22/18 1156  BP: (!) 136/92 108/74 125/84 (!) 138/98  Pulse: 69  64 69  Resp: 18  18 18   Temp: 97.9 F (36.6 C) 98 F (36.7 C) 98.8 F (37.1 C) 98.1 F (36.7 C)  TempSrc: Oral Oral Oral Oral  SpO2: 97% 97% 97% 99%  Weight:      Height:        [name] currently not in distress. . Afebrile. Head is nontraumatic. Neck is supple without bruit.    Cardiac exam no murmur or gallop. Lungs are  clear to auscultation. Distal pulses are well felt. Neurological Exam : Awake alert oriented x 3 normal mild dysarthria. PERRL, EOMI, visual field full. Mild left lower face asymmetry. Tongue midline. No drift. Mild diminished fine finger movements on left. Orbits right over left upper extremity. Mild left grip weak..mild left hip flexor weakness Normal sensation . Normal coordination.   ASSESSMENT/PLAN [name] is a 62 y.o. female with history of AF on Xarelto, HTN, CAD s/p CABG, HLD, prior stroke, and tobacco use presenting with HA.   IVH - secondary to HTN and Xarelto coagulopathy - Xarelto reversed with Andexxa   CT head - L lateral, 3rd ventricle with extension R frontal horn IVH, 3mm midline shift. Old R frontal/temporal/lentiform nucleus  infarcts.   Repeat CT head - stable, old R MCA infarct.   MRI with and without contrast - stable IVH, No hydrocephalus. Old RIGHT MCA territory infarct. Old small LEFT cerebellar infarct.   MRA head - no AVM or aneurysm  CUS - unremarkable  2D Echo - EF 55% to 60%.  SCDs for VTE prophylaxis  clopidogrel 75 mg daily and Xarelto (rivaroxaban) daily (pt questions if she was taking plavix, she is not sure) prior to admission, now on No antithrombotic given IVH. Will consider to start aspirin once stable, likely in 5-7 days. Follow up CT in 3-4 weeks, if IVH resolves, consider to resume DOACs with eliquis.   Therapy recommendations: SNF  Disposition:  pending  Atrial Fibrillation  Home anticoagulation:  Xarelto (rivaroxaban) daily   Xarelto reversed with Andexxa   On cardizem for rate control  Follow up CT in 3-4 weeks, if IVH resolves, consider to resume DOACs with eliquis.   Hypertensive Emergency  BP 199/120 on arrival  off Cleviprex . Home Meds: none . On lisinopril now . Long-term BP goal normotensive  Hyperlipidemia  Home meds:  Lipitor 20  LDL 148  On lipitor 40  Continue statin at discharge  Tobacco abuse  Current smoker  Smoking cessation counseling provided  Nicotine patch allergy and nicotine gum side effect (N/V)  Pt is willing to quit  Other Stroke Risk Factors  Advanced age  Hx stroke/TIA  11/2010 - R MCA infarct with residual mild L HP, embolic. TEE neg  02/2011 - TIA w/ transient dysarthria d/t decreased perfusion from bradycardia and hypotension  Coronary artery disease s/p CABG, stents - on plavix due to CAD  Chronic HA, on topamax. Followed by Dr. Pearlean Brownie  Obstructive sleep apnea   Other Active Problems  Hx tremor, now resolved. saw neurologist in [facility]  Hypokalemia - 2.6 - supplemented - recheck level 4.3  B12 low normal - on supplement   Hospital day # 4  Marvel Plan, MD PhD Stroke Neurology 03/22/2018 10:25 PM    To  contact Stroke Continuity provider, please refer to [email or web address]. After hours, contact General Neurology

## 2018-03-22 NOTE — Progress Notes (Signed)
Ordered KCL Repeat lab at 5pm.

## 2018-03-23 DIAGNOSIS — I6300 Cerebral infarction due to thrombosis of unspecified precerebral artery: Secondary | ICD-10-CM

## 2018-03-23 LAB — GLUCOSE, CAPILLARY
GLUCOSE-CAPILLARY: 130 mg/dL — AB (ref 70–99)
GLUCOSE-CAPILLARY: 129 mg/dL — AB (ref 70–99)
GLUCOSE-CAPILLARY: 97 mg/dL (ref 70–99)
Glucose-Capillary: 85 mg/dL (ref 70–99)
Glucose-Capillary: 108 mg/dL — ABNORMAL HIGH (ref 70–99)
Glucose-Capillary: 87 mg/dL (ref 70–99)

## 2018-03-23 LAB — BASIC METABOLIC PANEL
ANION GAP: 7 (ref 5–15)
BUN: 8 mg/dL (ref 6–20)
CALCIUM: 8.5 mg/dL — AB (ref 8.9–10.3)
CO2: 22 mmol/L (ref 22–32)
CREATININE: 1.11 mg/dL — AB (ref 0.44–1.00)
Chloride: 109 mmol/L (ref 98–111)
GFR calc Af Amer: >60 mL/min (ref >60)
GFR calc non Af Amer: 53 mL/min — ABNORMAL LOW (ref >60)
GLUCOSE: 91 mg/dL (ref 70–99)
Potassium: 4 mmol/L (ref 3.5–5.1)
Sodium: 138 mmol/L (ref 135–145)

## 2018-03-23 LAB — CBC
HEMATOCRIT: 36.8 % (ref 36.0–46.0)
Hemoglobin: 12.2 g/dL (ref 12.0–15.0)
MCH: 33.9 pg (ref 26.0–34.0)
MCHC: 33.2 g/dL (ref 30.0–36.0)
MCV: 102.2 fL — AB (ref 78.0–100.0)
Platelets: 193 10*3/uL (ref 150–400)
RBC: 3.6 MIL/uL — ABNORMAL LOW (ref 3.87–5.11)
RDW: 13.2 % (ref 11.5–15.5)
WBC: 5.9 10*3/uL (ref 4.0–10.5)

## 2018-03-23 LAB — POTASSIUM: Potassium: 3.6 mmol/L (ref 3.5–5.1)

## 2018-03-23 LAB — MAGNESIUM: Magnesium: 1.1 mg/dL — ABNORMAL LOW (ref 1.7–2.4)

## 2018-03-23 MED ORDER — DILTIAZEM HCL ER 60 MG PO CP12
120.0000 mg | ORAL_CAPSULE | Freq: Three times a day (TID) | ORAL | Status: DC
  Administered 2018-03-23 – 2018-03-24 (×6): 120 mg via ORAL
  Filled 2018-03-23 (×6): qty 2

## 2018-03-23 MED ORDER — LISINOPRIL 20 MG PO TABS
20.0000 mg | ORAL_TABLET | Freq: Two times a day (BID) | ORAL | Status: DC
  Administered 2018-03-23 – 2018-03-24 (×4): 20 mg via ORAL
  Filled 2018-03-23 (×4): qty 1

## 2018-03-23 MED ORDER — ASPIRIN EC 81 MG PO TBEC
81.0000 mg | DELAYED_RELEASE_TABLET | Freq: Every day | ORAL | Status: DC
  Administered 2018-03-23 – 2018-03-24 (×2): 81 mg via ORAL
  Filled 2018-03-23 (×2): qty 1

## 2018-03-23 NOTE — Care Management Important Message (Signed)
Important Message  Patient Details  Name: PAIGELYN SHILLINGLAW MRN: [id number] Date of Birth: [birth year]   Medicare Important Message Given:  Yes    Dorena Bodo 03/23/2018, 3:22 PM

## 2018-03-23 NOTE — Progress Notes (Signed)
  Speech Language Pathology Treatment: Cognitive-Linquistic  Patient Details Name: ALEXEE SPADAFORE MRN: [id number] DOB: May 28, 1956 Today's Date: 03/23/2018 Time: 1445-1500 SLP Time Calculation (min) (ACUTE ONLY): 15 min  Assessment / Plan / Recommendation Clinical Impression  Skilled treatment session focused on cognition goals. SLP facilitated session by providing Max A faded to Mod A to demonstrate intellectual awareness of deficits and to maintain sustained attention to task for ~ 5 minute intervals. Pt internally distracted by not feeling well. Education provided on orientation information and continued medical treatment/situation. Pt was left in bed, bed alarm on and all needs within reach. Continue per current plan of care.    HPI HPI: 62 year old female with a history of hypertension, anticoagulation who presents with intraventricular hemorrhage likely secondary to hypertension and anticoagulation.      SLP Plan  Continue with current plan of care                      Follow up Recommendations: Skilled Nursing facility SLP Visit Diagnosis: Frontal lobe and executive function deficit Frontal lobe and executive function deficit following: Other Nontraumatic ICH Plan: Continue with current plan of care       GO                Shacoria Latif 03/23/2018, 3:15 PM

## 2018-03-23 NOTE — Progress Notes (Signed)
Physical Therapy Treatment Patient Details Name: [name] MRN: [id number] DOB: 1956-08-25 Today's Date: 03/23/2018    History of Present Illness  [name] is a 62 y.o. female with a history of atrial fibrillation, hypertension, CHF, COPD. CABG and previous CVA w/ L sided weakness who presents with intraventricular hemorrhage.    PT Comments    Patient continues to demonstrate impaired mobility and cognition and needs assistance for OOB mobility. Pt limited by nausea this session and ambulated in room only. Continue to progress as tolerated with anticipated d/c to SNF for further skilled PT services.      Follow Up Recommendations  SNF;Supervision/Assistance - 24 hour     Equipment Recommendations  Rolling walker with 5" wheels    Recommendations for Other Services       Precautions / Restrictions Precautions Precautions: Fall Restrictions Weight Bearing Restrictions: No    Mobility  Bed Mobility Overal bed mobility: Needs Assistance Bed Mobility: Supine to Sit     Supine to sit: Supervision     General bed mobility comments: supervision for safety; use of rail   Transfers Overall transfer level: Needs assistance Equipment used: Rolling walker (2 wheeled) Transfers: Sit to/from Stand Sit to Stand: Min assist         General transfer comment: cues for safe hand placement; assist to steady while standing   Ambulation/Gait Ambulation/Gait assistance: Min assist;Mod assist Gait Distance (Feet): 20 Feet Assistive device: Rolling walker (2 wheeled) Gait Pattern/deviations: Decreased stride length;Drifts right/left     General Gait Details: decreased initiation of task and frequent stops while ambulating with difficulty beginning gait again; limited gait distance due to pt's c/o nausea   Stairs             Wheelchair Mobility    Modified Rankin (Stroke Patients Only)       Balance Overall balance assessment: Needs assistance   Sitting  balance-Leahy Scale: Fair     Standing balance support: Bilateral upper extremity supported Standing balance-Leahy Scale: Poor Standing balance comment: UE support for balance                            Cognition Arousal/Alertness: Awake/alert Behavior During Therapy: Flat affect(conversation appropriate) Overall Cognitive Status: Impaired/Different from baseline Area of Impairment: Awareness;Orientation;Memory;Attention;Problem solving;Safety/judgement;Following commands                 Orientation Level: Disoriented to;Time(pt reports it is 2017) Current Attention Level: Selective Memory: Decreased short-term memory Following Commands: Follows one step commands with increased time Safety/Judgement: Decreased awareness of safety;Decreased awareness of deficits Awareness: Intellectual Problem Solving: Slow processing;Decreased initiation General Comments: pt very slow to process and stopping frequently during session with blank stare      Exercises      General Comments        Pertinent Vitals/Pain Pain Assessment: Faces Faces Pain Scale: Hurts little more Pain Descriptors / Indicators: Headache Pain Intervention(s): Limited activity within patient's tolerance;Monitored during session    Home Living                      Prior Function            PT Goals (current goals can now be found in the care plan section) Acute Rehab PT Goals Patient Stated Goal: to regain independence PT Goal Formulation: With patient Time For Goal Achievement: 04/02/18 Potential to Achieve Goals: Good Progress towards PT goals: Progressing  toward goals    Frequency    Min 3X/week      PT Plan Current plan remains appropriate    Co-evaluation              AM-PAC PT "6 Clicks" Daily Activity  Outcome Measure  Difficulty turning over in bed (including adjusting bedclothes, sheets and blankets)?: A Lot Difficulty moving from lying on back to  sitting on the side of the bed? : A Lot Difficulty sitting down on and standing up from a chair with arms (e.g., wheelchair, bedside commode, etc,.)?: Unable Help needed moving to and from a bed to chair (including a wheelchair)?: A Little Help needed walking in hospital room?: A Lot Help needed climbing 3-5 steps with a railing? : A Lot 6 Click Score: 12    End of Session Equipment Utilized During Treatment: Gait belt Activity Tolerance: Other (comment)(limited by nausea) Patient left: with call bell/phone within reach;in chair Nurse Communication: Mobility status;Other (comment)(pt nauseated) PT Visit Diagnosis: Unsteadiness on feet (R26.81);Other abnormalities of gait and mobility (R26.89);Muscle weakness (generalized) (M62.81);Other symptoms and signs involving the nervous system (R29.898)     Time: 1356-1420 PT Time Calculation (min) (ACUTE ONLY): 24 min  Charges:  $Gait Training: 8-22 mins $Therapeutic Activity: 8-22 mins                    G Codes:       Erline Levine, PTA Pager: [phone]     Carolynne Edouard 03/23/2018, 2:39 PM

## 2018-03-23 NOTE — Clinical Social Work Placement (Signed)
   CLINICAL SOCIAL WORK PLACEMENT  NOTE  Date:  03/23/2018  Patient Details  Name: [name] MRN: [id number] Date of Birth: [birth year]  Clinical Social Work is seeking post-discharge placement for this patient at the Skilled  Nursing Facility level of care (*CSW will initial, date and re-position this form in  chart as items are completed):  Yes(Emailed to son)   Patient/family provided with [facility] Clinical Social Work Department's list of facilities offering this level of care within the geographic area requested by the patient (or if unable, by the patient's family).  Yes   Patient/family informed of their freedom to choose among providers that offer the needed level of care, that participate in Medicare, Medicaid or managed care program needed by the patient, have an available bed and are willing to accept the patient.  Yes   Patient/family informed of [facility]'s ownership interest in [facility] and [facility], as well as of the fact that they are under no obligation to receive care at these facilities.  PASRR submitted to EDS on 03/23/18     PASRR number received on       Existing PASRR number confirmed on       FL2 transmitted to all facilities in geographic area requested by pt/family on 03/23/18     FL2 transmitted to all facilities within larger geographic area on       Patient informed that his/her managed care company has contracts with or will negotiate with certain facilities, including the following:            Patient/family informed of bed offers received.  Patient chooses bed at       Physician recommends and patient chooses bed at      Patient to be transferred to   on  .  Patient to be transferred to facility by       Patient family notified on   of transfer.  Name of family member notified:        PHYSICIAN       Additional Comment:    _______________________________________________ Cristobal Goldmann, LCSW 03/23/2018,  5:39 PM

## 2018-03-23 NOTE — Progress Notes (Addendum)
STROKE TEAM PROGRESS NOTE   INTERVAL HISTORY No family is at the bedside.  Pt awake alert and conversing well. Sitting in bed, states HA has now resolved. Low K+ normalized with supplement. Contacted Vanessa, SW to ensure she knew pt was ready for SNF placement when bed available. Complains of mild neck discomfort. No pain with movement. Does not want to take any pain medication.  CBC:  Recent Labs  Lab 03/18/18 0658  03/22/18 0414 03/23/18 0801  WBC 8.3   < > 5.4 5.9  NEUTROABS 6.7  --   --   --   HGB 14.3   < > 12.2 12.2  HCT 42.2   < > 35.7* 36.8  MCV 98.1   < > 98.3 102.2*  PLT 296   < > 195 193   < > = values in this interval not displayed.    Basic Metabolic Panel:  Recent Labs  Lab 03/22/18 0414 03/22/18 1612 03/23/18 0801  NA 139  --  138  K 2.6* 4.3 4.0  CL 108  --  109  CO2 23  --  22  GLUCOSE 94  --  91  BUN 14  --  8  CREATININE 1.24*  --  1.11*  CALCIUM 8.2*  --  8.5*   Lipid Panel:     Component Value Date/Time   CHOL 223 (H) 03/21/2018 0650   TRIG 137 03/21/2018 0650   HDL 48 03/21/2018 0650   CHOLHDL 4.6 03/21/2018 0650   VLDL 27 03/21/2018 0650   LDLCALC 148 (H) 03/21/2018 0650   HgbA1c:  Lab Results  Component Value Date   HGBA1C 5.1 03/21/2018   Urine Drug Screen:     Component Value Date/Time   LABOPIA NONE DETECTED 03/18/2018 0840   COCAINSCRNUR NONE DETECTED 03/18/2018 0840   LABBENZ NONE DETECTED 03/18/2018 0840   AMPHETMU NONE DETECTED 03/18/2018 0840   THCU NONE DETECTED 03/18/2018 0840   LABBARB (A) 03/18/2018 0840    Result not available. Reagent lot number recalled by manufacturer.    Alcohol Level No results found for: [facility]  IMAGING  Ct Head Wo Contrast 03/19/2018  IMPRESSION:  1. Unchanged intraventricular hemorrhage. Stable ventricular volume without hydrocephalus.  2. Remote right MCA branch infarct.   Mr Lodema Pilot Contrast 03/20/2018 IMPRESSION:  1. Intraventricular hemorrhage of varying ages.  No hydrocephalus.   2. Subcentimeter acute versus subacute RIGHT cerebrum infarcts, possibly artifact.  3. Old RIGHT MCA territory infarct.  4. Old small LEFT cerebellar infarct. Moderate chronic small vessel ischemic changes.   MR MRA Head WO Contrast 03/22/2018 IMPRESSION: 1. No aneurysm, large vessel occlusion or high-grade stenosis. 2. Unchanged distribution of intraventricular blood, predominantly located in the left lateral ventricle. Size and configuration of the ventricles are unchanged.  CUS - 1-39% ICA plaquing. Vertebral artery flow is antegrade.   2D Echocardiogram  - Left ventricle: The cavity size was normal. Wall thickness was increased in a pattern of moderate LVH. Systolic function was normal. The estimated ejection fraction was in the range of 55% to 60%. - Left atrium: The atrium was mildly dilated.   PHYSICAL EXAM Vitals:   03/22/18 2348 03/23/18 0435 03/23/18 0743 03/23/18 1150  BP: 132/88 (!) 158/96 (!) 164/106 (!) 139/98  Pulse: 90 96 87 (!) 102  Resp:  18 18 18   Temp: 98.1 F (36.7 C) 98.1 F (36.7 C) 99 F (37.2 C) 99.1 F (37.3 C)  TempSrc: Oral Oral Oral Oral  SpO2: 100% 100% 100%  100%  Weight:      Height:       Pleasant middle-aged Caucasian lady currently not in distress. Afebrile. Head is nontraumatic. Neck is supple without bruit, no elicited pain with ROM movement.  Cardiac exam no murmur or gallop. Lungs are clear to auscultation. Distal pulses are well felt.  Neurological Exam : Awake alert oriented x 3 normal mild dysarthria. PERRL, EOMI, visual field full. Mild left lower face asymmetry. Tongue midline. No drift. Mild diminished fine finger movements on left. Orbits right over left upper extremity. Mild left grip weak Mild left hip flexor weakness. Normal sensation. Normal coordination.   ASSESSMENT/PLAN [name] is a 62 y.o. female with history of AF on Xarelto, HTN, CAD s/p CABG, HLD, prior stroke, and tobacco use presenting with HA.   IVH -  secondary to HTN and Xarelto coagulopathy - Xarelto reversed with Andexxa   CT head - L lateral, 3rd ventricle with extension R frontal horn IVH, 48mm midline shift. Old R frontal/temporal/lentiform nucleus infarcts.   Repeat CT head - stable, old R MCA infarct.   MRI with and without contrast - stable IVH, No hydrocephalus. Old RIGHT MCA territory infarct. Old small LEFT cerebellar infarct.   MRA head - no AVM or aneurysm  CUS - unremarkable  2D Echo - EF 55% to 60%.  SCDs for VTE prophylaxis  clopidogrel 75 mg daily and Xarelto (rivaroxaban) daily (pt questions if she was taking plavix, she is not sure) prior to admission, now on No antithrombotic given IVH. Will start low dose aspirin today. Plan follow up CT in 3-4 weeks, if IVH resolves, consider to resume DOACs with eliquis.   Therapy recommendations: SNF. Medically ready for d/c  Disposition:  pending  Atrial Fibrillation  Home anticoagulation:  Xarelto (rivaroxaban) daily   Xarelto reversed with Andexxa   On cardizem for rate control  Follow up CT in 3-4 weeks, if IVH resolves, consider to resume DOACs with eliquis.   Hypertensive Emergency  BP 199/120 on arrival  off Cleviprex . Home Meds: none . On lisinopril now . Long-term BP goal normotensive  Hyperlipidemia  Home meds:  Lipitor 20  LDL 148  On lipitor 40  Continue statin at discharge  Tobacco abuse  Current smoker  Smoking cessation counseling provided  Nicotine patch allergy and nicotine gum side effect (N/V)  Pt is willing to quit  Other Stroke Risk Factors  Advanced age  Hx stroke/TIA  11/2010 - R MCA infarct with residual mild L HP, embolic. TEE neg  02/2011 - TIA w/ transient dysarthria d/t decreased perfusion from bradycardia and hypotension  Coronary artery disease s/p CABG, stents - on plavix due to CAD  Chronic HA, on topamax. Followed by Dr. Pearlean Brownie  Obstructive sleep apnea   Other Active Problems  Hx tremor, now  resolved. saw neurologist in [facility]  Hypokalemia, resolved - 2.6->4.0 - supplemented   B12 low normal - on supplement   Hospital day # 5  Tina Main, MSN, APRN, ANVP-BC, AGPCNP-BC Advanced Practice Stroke Nurse [facility] See Amion for Schedule & Pager information 03/23/2018 2:03 PM   ATTENDING NOTE: I reviewed above note and agree with the assessment and plan. I have made any additions or clarifications directly to the above note. Pt was seen and examined.   No acute event overnight. Denies any HA. Neuro stable. Pending d/c to SNF. SW is working on it. Cre improved, continue IVF till discharge. On ASA 81 now, will  repeat CT in 3-4 weeks, if blood absorbed, will resume AC, preferably eliquis. Cardiology consult appreciated.  Marvel Plan, MD PhD Stroke Neurology 03/23/2018 9:39 PM     To contact Stroke Continuity provider, please refer to [email or web address]. After hours, contact General Neurology

## 2018-03-23 NOTE — Clinical Social Work Note (Signed)
Clinical Social Work Assessment  Patient Details  Name: [name] MRN: [id number] Date of Birth: 08-15-1956  Date of referral:  03/23/18               Reason for consult:  Facility Placement, Discharge Planning                Permission sought to share information with:  Family Supports Permission granted to share information::  No(Patient oriented to person and place only)  Name::        Agency::     Relationship::     Contact Information:     Housing/Transportation Living arrangements for the past 2 months:  Single Family Home(Patient rents a house with 2 other people) Source of Information:  Adult Children(Son [name]) Patient Interpreter Needed:  None Criminal Activity/Legal Involvement Pertinent to Current Situation/Hospitalization:  No - Comment as needed Significant Relationships:  Adult Children Lives with:  Roommate(2 roommates per son) Do you feel safe going back to the place where you live?  No(Son in agreement with ST rehab before patient discharges home) Need for family participation in patient care:  Yes (Comment)  Care giving concerns:  Son in agreement with ST rehab as patient lives with roommates and can get assistance from them occasionally. Son also reported that transportation is a challenge for him, so he is able to see his mother occasionally.  Social Worker assessment / plan:  CSW talked with patient's son, [name] by phone [phone]) regarding discharge disposition for his mother. Son advised that PT recommending SNF placement for ST rehab. When asked, Mr. Marveen Reeks reported that his mother has been to rehab before in 2012. CSW advised by son that patient lives with 2 roommates in [place] and he also lives in [place] and has transportation challenges. Son in agreement with ST rehab and provided CSW his e-mail address so that he can receive SNF list. Facility search process explained and advised that he will be contacted regarding facility  responses and will choose from the facilities that respond yes. Son expressed understanding.  Employment status:  Disabled (Comment on whether or not currently receiving Disability) Insurance information:  Managed Medicare([employer]) PT Recommendations:  Skilled Nursing Facility Information / Referral to community resources:  Skilled Nursing Facility(Son emailed SNF llist - [email or web address])  Patient/Family's Response to care: Son had no questions or expressed no concerns regarding his mother's care during hospitalization.  Patient/Family's Understanding of and Emotional Response to Diagnosis, Current Treatment, and Prognosis: Son expressed understanding regarding his mother's need for ST rehab.  Emotional Assessment Appearance:  Other (Comment Required(Did not visit with patient) Attitude/Demeanor/Rapport:  Unable to Assess(Did not visit with patient, talked with son by phone) Affect (typically observed):  Unable to Assess Orientation:  Oriented to Self, Oriented to Place Alcohol / Substance use:  Tobacco Use, Alcohol Use, Illicit Drugs(Per H&P patient reported that she smnokes, and does not drink or use illicit drugs) Psych involvement (Current and /or in the community):  No (Comment)  Discharge Needs  Concerns to be addressed:  Discharge Planning Concerns Readmission within the last 30 days:  No Current discharge risk:  None Barriers to Discharge:  Awaiting State Approval ([place])   Cristobal Goldmann, LCSW 03/23/2018, 5:26 PM

## 2018-03-23 NOTE — Progress Notes (Signed)
Progress Note  Patient Name: [name] Date of Encounter: 03/23/2018  Primary Cardiologist: Tobias Alexander, MD   Subjective   No complaints   Inpatient Medications    Scheduled Meds: . atorvastatin  40 mg Oral q1800  . buPROPion  300 mg Oral q morning - 10a  . diltiazem  120 mg Oral Q12H  . escitalopram  10 mg Oral Daily  . famotidine  20 mg Oral Daily  . fluticasone  2 spray Each Nare Daily  . levothyroxine  200 mcg Oral QAC breakfast  . lisinopril  20 mg Oral BID  . magnesium oxide  400 mg Oral Daily  . pantoprazole  40 mg Oral Daily  . senna-docusate  1 tablet Oral BID  . topiramate  50 mg Oral BID  . vitamin B-12  1,000 mcg Oral Daily   Continuous Infusions: . sodium chloride 75 mL/hr at 03/23/18 0800   PRN Meds: acetaminophen **OR** [DISCONTINUED] acetaminophen (TYLENOL) oral liquid 160 mg/5 mL **OR** [DISCONTINUED] acetaminophen, butalbital-acetaminophen-caffeine, ipratropium-albuterol, labetalol, ondansetron (ZOFRAN) IV   Vital Signs    Vitals:   03/22/18 1930 03/22/18 2348 03/23/18 0435 03/23/18 0743  BP: (!) 127/93 132/88 (!) 158/96 (!) 164/106  Pulse: 89 90 96 87  Resp: 18  18 18   Temp: 99.2 F (37.3 C) 98.1 F (36.7 C) 98.1 F (36.7 C) 99 F (37.2 C)  TempSrc: Oral Oral Oral Oral  SpO2: 99% 100% 100% 100%  Weight:      Height:        Intake/Output Summary (Last 24 hours) at 03/23/2018 0920 Last data filed at 03/23/2018 0800 Gross per 24 hour  Intake 2826.09 ml  Output -  Net 2826.09 ml   Filed Weights   03/18/18 0637 03/18/18 1000  Weight: 218 lb (98.9 kg) 171 lb 4.8 oz (77.7 kg)    Telemetry    Atrial fib rates 80;s 03/23/2018  - Personally Reviewed  ECG    Afib nonspecific ST changes   Physical Exam   Affect appropriate Healthy:  appears stated age HEENT: mild left facial droop  Neck supple with no adenopathy JVP normal no bruits no thyromegaly Lungs clear with no wheezing and good diaphragmatic motion Heart:  S1/S2 no  murmur, no rub, gallop or click PMI normal Abdomen: benighn, BS positve, no tenderness, no AAA no bruit.  No HSM or HJR Distal pulses intact with no bruits No edema Neuro non-focal Skin warm and dry No muscular weakness   Labs    Chemistry Recent Labs  Lab 03/18/18 0658 03/21/18 0650 03/22/18 0414 03/22/18 1612 03/23/18 0801  NA 136 135 139  --  138  K 4.1 2.9* 2.6* 4.3 4.0  CL 104 102 108  --  109  CO2 21* 23 23  --  22  GLUCOSE 101* 91 94  --  91  BUN 12 17 14   --  8  CREATININE 1.20* 1.35* 1.24*  --  1.11*  CALCIUM 8.6* 8.7* 8.2*  --  8.5*  PROT 7.7  --   --   --   --   ALBUMIN 4.0  --   --   --   --   AST 23  --   --   --   --   ALT 9  --   --   --   --   ALKPHOS 64  --   --   --   --   BILITOT 1.4*  --   --   --   --  GFRNONAA 48* 42* 46*  --  53*  GFRAA 56* 48* 54*  --  >60  ANIONGAP 11 10 8   --  7     Hematology Recent Labs  Lab 03/21/18 0650 03/22/18 0414 03/23/18 0801  WBC 6.0 5.4 5.9  RBC 3.90 3.63* 3.60*  HGB 13.1 12.2 12.2  HCT 38.7 35.7* 36.8  MCV 99.2 98.3 102.2*  MCH 33.6 33.6 33.9  MCHC 33.9 34.2 33.2  RDW 13.2 13.2 13.2  PLT 201 195 193    Cardiac EnzymesNo results for input(s): TROPONINI in the last 168 hours.  Recent Labs  Lab 03/18/18 0705  TROPIPOC 0.03     BNPNo results for input(s): BNP, PROBNP in the last 168 hours.   DDimer No results for input(s): DDIMER in the last 168 hours.   Radiology    Mr Maxine Glenn Head Wo Contrast  Result Date: 03/22/2018 CLINICAL DATA:  Intraventricular hemorrhage EXAM: MRA HEAD WITHOUT CONTRAST TECHNIQUE: Angiographic images of the Circle of Willis were obtained using MRA technique without intravenous contrast. COMPARISON:  Brain MRI 03/20/2018 FINDINGS: There is unchanged intraventricular blood, predominantly in the left lateral ventricle. The size and configuration of the ventricles are unchanged. ANTERIOR CIRCULATION: --Intracranial internal carotid arteries: Normal. --Anterior cerebral arteries:  Normal variant absent left A1 segment. Anterior communicating arteries patent. The A2 segments and beyond are normal. --Middle cerebral arteries: Normal. --Posterior communicating arteries: Present on the left only. POSTERIOR CIRCULATION: --Basilar artery: Normal. --Posterior cerebral arteries: Normal.  Fetal origin on the left. --Superior cerebellar arteries: Normal. --Inferior cerebellar arteries: Normal anterior and posterior inferior cerebellar arteries. IMPRESSION: 1. No aneurysm, large vessel occlusion or high-grade stenosis. 2. Unchanged distribution of intraventricular blood, predominantly located in the left lateral ventricle. Size and configuration of the ventricles are unchanged. Electronically Signed   By: Deatra Robinson M.D.   On: 03/22/2018 02:27    Cardiac Studies   none  Patient Profile     62 y.o. female admitted with hemorrhagic stroke in setting of known atrial fib.   Assessment & Plan  1. Atrial fib - rate control fine ASA in a week and resume eliquis in 4 weeks if f/u CT to be done By neuro shows resolution of bleed  2. HTN - her blood pressure is up a bit. Continue lisinopril 20 bid increase cardizem  3. Hypokalemia - resolved 4.0 today   Charlton Haws

## 2018-03-23 NOTE — Consult Note (Signed)
[facility] CM Primary Care Navigator  03/23/2018  [name] [birth year] [id number]   Met withpatient at the bedside toidentify possible discharge needs. Patientreportshaving "irregular heart beats and headache" thatresulted to thisadmission. (Intraventricular hemorrhage of varying ages- hemorrhagic stroke in setting of known atrial fibrillation).  PatientendorsesDr.Wanda Panosh with [facility]rapist, [facility] at [employer] as theprimary care provider.   [facility] [address] [facility].  [facility] of "pill box" system filled once a month.  Patient reports using [facility] transportationto herdoctors' appointments. Her son [name]) or her roommatealso provides transportation if available, whenever needed.  Patientmentioned living at home with roommate. She verbalized that her son or her roommate can provide assistance to her when they are available.  Anticipated plan for discharge isskilled nursing facility (SNF)for rehabilitationper therapy recommendation.  Patientvoiced understandingto callprimarycareprovider'soffice whenshereturns backhome,for a post discharge follow-up visit within1- 2 weeksor sooner if needs arise.Patient letter (with PCP's contact number) was provided asareminder.   Explained topatientregarding [facility] CM services available for health management/ resourcesat homeandsheseemed interested with it. She is aware to discuss with primary care provider on her next visit about further assistance in managing her health issues (congestive HF/ COPD)when she is back home.Patient indicated that she has been managing her health conditions so far and for the most part, it has not been a pressing problem for her as stated.  Patientverbalizedunderstandingto seekreferral from primary care provider to [facility] care management  ifdeemed necessary and appropriatefor anyservicesin the nearfuture,once discharge back home.   [facility] care management information was provided for futureneeds thatshemay have.  Primary care provider's office is listed as providing transition of care (TOC) follow-up.    For additional questions please contact:  Edwena Felty A. Francenia Chimenti, BSN, RN-BC [facility] PRIMARY CARE Navigator Cell: [phone]

## 2018-03-23 NOTE — Progress Notes (Signed)
Occupational Therapy Treatment Patient Details Name: [name] MRN: [id number] DOB: June 25, 1956 Today's Date: 03/23/2018    History of present illness  [name] is a 61 y.o. female with a history of atrial fibrillation, hypertension, CHF, COPD. CABG and previous CVA w/ L sided weakness who presents with intraventricular hemorrhage.   OT comments  Pt demonstrating progress toward OT goals this session. She continues to demonstrate flat affect and confusion concerning time this session. She did demonstrate improved stability requiring min guard to min assist for functional mobility with RW to sink for grooming tasks today. Pt requiring nearly continuous cues to continue with tasks this session frequently stopping in the middle of a task. She was very tangential as well making off topic comments requiring redirection of focus. D/C recommendation remains appropriate. OT will continue to follow while admitted.    Follow Up Recommendations  SNF;Supervision/Assistance - 24 hour    Equipment Recommendations  Other (comment)(defer to next venue of care)    Recommendations for Other Services      Precautions / Restrictions Precautions Precautions: Fall Restrictions Weight Bearing Restrictions: No       Mobility Bed Mobility Overal bed mobility: Needs Assistance Bed Mobility: Supine to Sit     Supine to sit: Supervision     General bed mobility comments: OOB in recliner  Transfers Overall transfer level: Needs assistance Equipment used: Rolling walker (2 wheeled) Transfers: Sit to/from Stand Sit to Stand: Min assist         General transfer comment: Min assist to power up. Cues for proper hand placement.     Balance Overall balance assessment: Needs assistance Sitting-balance support: No upper extremity supported;Feet supported Sitting balance-Leahy Scale: Fair     Standing balance support: Bilateral upper extremity supported Standing balance-Leahy Scale:  Poor Standing balance comment: UE support for balance                           ADL either performed or assessed with clinical judgement   ADL Overall ADL's : Needs assistance/impaired     Grooming: Min guard;Standing Grooming Details (indicate cue type and reason): step by step cues to continue with tasks; noted poor depth perception when spitting into sink and decreased visual scanning to utilize towel to clean off sink.                  Toilet Transfer: Minimal assistance;Min guard;Ambulation;RW Toilet Transfer Details (indicate cue type and reason): min guard to min assist at times due to pt with posterior LOB; cues to sequence and continue with tasks         Functional mobility during ADLs: Min guard;Minimal assistance;Rolling walker General ADL Comments: Pt tangential throughout session reporting "I just realized that I have let too many critics speak into my life" which was unrelated to conversation at hand.      Vision   Additional Comments: Pt with difficulty identifying objects on sink but throughout visual field (not confined to one area of visual field) and this is likely due to cognition.    Perception     Praxis      Cognition Arousal/Alertness: Awake/alert Behavior During Therapy: Flat affect Overall Cognitive Status: Impaired/Different from baseline Area of Impairment: Awareness;Orientation;Memory;Attention;Problem solving;Safety/judgement;Following commands                 Orientation Level: Disoriented to;Time(reports it is Tues, 2019 and the morning) Current Attention Level: Selective Memory: Decreased short-term memory  Following Commands: Follows one step commands with increased time Safety/Judgement: Decreased awareness of safety;Decreased awareness of deficits Awareness: Intellectual Problem Solving: Slow processing;Decreased initiation General Comments: Pt with slow processing and very tangential throughout session. She  requires consistent cues throughout to continue with tasks.         Exercises     Shoulder Instructions       General Comments no family present in room    Pertinent Vitals/ Pain       Pain Assessment: 0-10 Pain Score: 4  Faces Pain Scale: Hurts little more Pain Location: headache Pain Descriptors / Indicators: Headache Pain Intervention(s): Limited activity within patient's tolerance  Home Living                                          Prior Functioning/Environment              Frequency  Min 2X/week        Progress Toward Goals  OT Goals(current goals can now be found in the care plan section)  Progress towards OT goals: Progressing toward goals  Acute Rehab OT Goals Patient Stated Goal: to regain independence OT Goal Formulation: With patient Time For Goal Achievement: 04/02/18 Potential to Achieve Goals: Good  Plan Discharge plan remains appropriate    Co-evaluation                 AM-PAC PT "6 Clicks" Daily Activity     Outcome Measure   Help from another person eating meals?: A Little Help from another person taking care of personal grooming?: A Little Help from another person toileting, which includes using toliet, bedpan, or urinal?: A Lot Help from another person bathing (including washing, rinsing, drying)?: A Lot Help from another person to put on and taking off regular upper body clothing?: A Lot Help from another person to put on and taking off regular lower body clothing?: A Lot 6 Click Score: 14    End of Session Equipment Utilized During Treatment: Gait belt;Rolling walker  OT Visit Diagnosis: Unsteadiness on feet (R26.81);Other abnormalities of gait and mobility (R26.89);Muscle weakness (generalized) (M62.81);Other symptoms and signs involving cognitive function   Activity Tolerance Patient tolerated treatment well   Patient Left in chair;with call bell/phone within reach   Nurse Communication           Time: 1510-1530 OT Time Calculation (min): 20 min  Charges: OT General Charges $OT Visit: 1 Visit OT Treatments $Self Care/Home Management : 8-22 mins  Doristine Section, MS OTR/L  Pager: [phone]    Lakayla Barrington A Yifan Auker 03/23/2018, 4:11 PM

## 2018-03-24 DIAGNOSIS — R414 Neurologic neglect syndrome: Secondary | ICD-10-CM | POA: Diagnosis not present

## 2018-03-24 DIAGNOSIS — Z9114 Patient's other noncompliance with medication regimen: Secondary | ICD-10-CM | POA: Diagnosis not present

## 2018-03-24 DIAGNOSIS — I21A1 Myocardial infarction type 2: Secondary | ICD-10-CM | POA: Diagnosis not present

## 2018-03-24 DIAGNOSIS — R001 Bradycardia, unspecified: Secondary | ICD-10-CM | POA: Diagnosis not present

## 2018-03-24 DIAGNOSIS — I161 Hypertensive emergency: Secondary | ICD-10-CM | POA: Diagnosis not present

## 2018-03-24 DIAGNOSIS — T68XXXA Hypothermia, initial encounter: Secondary | ICD-10-CM | POA: Diagnosis not present

## 2018-03-24 DIAGNOSIS — E785 Hyperlipidemia, unspecified: Secondary | ICD-10-CM | POA: Diagnosis not present

## 2018-03-24 DIAGNOSIS — I69198 Other sequelae of nontraumatic intracerebral hemorrhage: Secondary | ICD-10-CM | POA: Diagnosis not present

## 2018-03-24 DIAGNOSIS — E43 Unspecified severe protein-calorie malnutrition: Secondary | ICD-10-CM | POA: Diagnosis not present

## 2018-03-24 DIAGNOSIS — I69154 Hemiplegia and hemiparesis following nontraumatic intracerebral hemorrhage affecting left non-dominant side: Secondary | ICD-10-CM | POA: Diagnosis not present

## 2018-03-24 DIAGNOSIS — F39 Unspecified mood [affective] disorder: Secondary | ICD-10-CM | POA: Diagnosis not present

## 2018-03-24 DIAGNOSIS — I4891 Unspecified atrial fibrillation: Secondary | ICD-10-CM | POA: Diagnosis not present

## 2018-03-24 DIAGNOSIS — L309 Dermatitis, unspecified: Secondary | ICD-10-CM | POA: Diagnosis not present

## 2018-03-24 DIAGNOSIS — I25709 Atherosclerosis of coronary artery bypass graft(s), unspecified, with unspecified angina pectoris: Secondary | ICD-10-CM | POA: Diagnosis not present

## 2018-03-24 DIAGNOSIS — G4489 Other headache syndrome: Secondary | ICD-10-CM | POA: Diagnosis not present

## 2018-03-24 DIAGNOSIS — Z9861 Coronary angioplasty status: Secondary | ICD-10-CM | POA: Diagnosis not present

## 2018-03-24 DIAGNOSIS — M6281 Muscle weakness (generalized): Secondary | ICD-10-CM | POA: Diagnosis not present

## 2018-03-24 DIAGNOSIS — E782 Mixed hyperlipidemia: Secondary | ICD-10-CM | POA: Diagnosis not present

## 2018-03-24 DIAGNOSIS — N183 Chronic kidney disease, stage 3 (moderate): Secondary | ICD-10-CM | POA: Diagnosis not present

## 2018-03-24 DIAGNOSIS — F17200 Nicotine dependence, unspecified, uncomplicated: Secondary | ICD-10-CM | POA: Diagnosis not present

## 2018-03-24 DIAGNOSIS — I252 Old myocardial infarction: Secondary | ICD-10-CM | POA: Diagnosis not present

## 2018-03-24 DIAGNOSIS — R51 Headache: Secondary | ICD-10-CM | POA: Diagnosis present

## 2018-03-24 DIAGNOSIS — R9431 Abnormal electrocardiogram [ECG] [EKG]: Secondary | ICD-10-CM | POA: Diagnosis not present

## 2018-03-24 DIAGNOSIS — I214 Non-ST elevation (NSTEMI) myocardial infarction: Secondary | ICD-10-CM | POA: Diagnosis not present

## 2018-03-24 DIAGNOSIS — J439 Emphysema, unspecified: Secondary | ICD-10-CM | POA: Diagnosis not present

## 2018-03-24 DIAGNOSIS — J449 Chronic obstructive pulmonary disease, unspecified: Secondary | ICD-10-CM | POA: Diagnosis not present

## 2018-03-24 DIAGNOSIS — R748 Abnormal levels of other serum enzymes: Secondary | ICD-10-CM | POA: Diagnosis not present

## 2018-03-24 DIAGNOSIS — I110 Hypertensive heart disease with heart failure: Secondary | ICD-10-CM | POA: Diagnosis not present

## 2018-03-24 DIAGNOSIS — E039 Hypothyroidism, unspecified: Secondary | ICD-10-CM | POA: Diagnosis not present

## 2018-03-24 DIAGNOSIS — I481 Persistent atrial fibrillation: Secondary | ICD-10-CM | POA: Diagnosis not present

## 2018-03-24 DIAGNOSIS — R4182 Altered mental status, unspecified: Secondary | ICD-10-CM | POA: Diagnosis not present

## 2018-03-24 DIAGNOSIS — Z6829 Body mass index (BMI) 29.0-29.9, adult: Secondary | ICD-10-CM | POA: Diagnosis not present

## 2018-03-24 DIAGNOSIS — I63512 Cerebral infarction due to unspecified occlusion or stenosis of left middle cerebral artery: Secondary | ICD-10-CM | POA: Diagnosis not present

## 2018-03-24 DIAGNOSIS — R531 Weakness: Secondary | ICD-10-CM | POA: Diagnosis not present

## 2018-03-24 DIAGNOSIS — I10 Essential (primary) hypertension: Secondary | ICD-10-CM | POA: Diagnosis not present

## 2018-03-24 DIAGNOSIS — I429 Cardiomyopathy, unspecified: Secondary | ICD-10-CM | POA: Diagnosis not present

## 2018-03-24 DIAGNOSIS — R0600 Dyspnea, unspecified: Secondary | ICD-10-CM | POA: Diagnosis not present

## 2018-03-24 DIAGNOSIS — F329 Major depressive disorder, single episode, unspecified: Secondary | ICD-10-CM | POA: Diagnosis not present

## 2018-03-24 DIAGNOSIS — G8190 Hemiplegia, unspecified affecting unspecified side: Secondary | ICD-10-CM | POA: Diagnosis not present

## 2018-03-24 DIAGNOSIS — I25810 Atherosclerosis of coronary artery bypass graft(s) without angina pectoris: Secondary | ICD-10-CM | POA: Diagnosis not present

## 2018-03-24 DIAGNOSIS — I248 Other forms of acute ischemic heart disease: Secondary | ICD-10-CM | POA: Diagnosis not present

## 2018-03-24 DIAGNOSIS — E669 Obesity, unspecified: Secondary | ICD-10-CM | POA: Diagnosis not present

## 2018-03-24 DIAGNOSIS — G44221 Chronic tension-type headache, intractable: Secondary | ICD-10-CM | POA: Diagnosis not present

## 2018-03-24 DIAGNOSIS — Z951 Presence of aortocoronary bypass graft: Secondary | ICD-10-CM | POA: Diagnosis not present

## 2018-03-24 DIAGNOSIS — Z8673 Personal history of transient ischemic attack (TIA), and cerebral infarction without residual deficits: Secondary | ICD-10-CM | POA: Diagnosis not present

## 2018-03-24 DIAGNOSIS — R0602 Shortness of breath: Secondary | ICD-10-CM | POA: Diagnosis not present

## 2018-03-24 DIAGNOSIS — I480 Paroxysmal atrial fibrillation: Secondary | ICD-10-CM | POA: Diagnosis not present

## 2018-03-24 DIAGNOSIS — I5032 Chronic diastolic (congestive) heart failure: Secondary | ICD-10-CM | POA: Diagnosis not present

## 2018-03-24 DIAGNOSIS — I615 Nontraumatic intracerebral hemorrhage, intraventricular: Secondary | ICD-10-CM | POA: Diagnosis not present

## 2018-03-24 DIAGNOSIS — I2510 Atherosclerotic heart disease of native coronary artery without angina pectoris: Secondary | ICD-10-CM | POA: Diagnosis not present

## 2018-03-24 DIAGNOSIS — R52 Pain, unspecified: Secondary | ICD-10-CM | POA: Diagnosis not present

## 2018-03-24 DIAGNOSIS — Z818 Family history of other mental and behavioral disorders: Secondary | ICD-10-CM | POA: Diagnosis not present

## 2018-03-24 DIAGNOSIS — G4733 Obstructive sleep apnea (adult) (pediatric): Secondary | ICD-10-CM | POA: Diagnosis not present

## 2018-03-24 DIAGNOSIS — F332 Major depressive disorder, recurrent severe without psychotic features: Secondary | ICD-10-CM | POA: Diagnosis not present

## 2018-03-24 DIAGNOSIS — I5042 Chronic combined systolic (congestive) and diastolic (congestive) heart failure: Secondary | ICD-10-CM | POA: Diagnosis not present

## 2018-03-24 DIAGNOSIS — I69192 Facial weakness following nontraumatic intracerebral hemorrhage: Secondary | ICD-10-CM | POA: Diagnosis not present

## 2018-03-24 DIAGNOSIS — F17210 Nicotine dependence, cigarettes, uncomplicated: Secondary | ICD-10-CM | POA: Diagnosis not present

## 2018-03-24 LAB — CBC
HCT: 33.9 % — ABNORMAL LOW (ref 36.0–46.0)
Hemoglobin: 11.3 g/dL — ABNORMAL LOW (ref 12.0–15.0)
MCH: 33.6 pg (ref 26.0–34.0)
MCHC: 33.3 g/dL (ref 30.0–36.0)
MCV: 100.9 fL — AB (ref 78.0–100.0)
PLATELETS: 197 10*3/uL (ref 150–400)
RBC: 3.36 MIL/uL — AB (ref 3.87–5.11)
RDW: 13.2 % (ref 11.5–15.5)
WBC: 7.6 10*3/uL (ref 4.0–10.5)

## 2018-03-24 LAB — GLUCOSE, CAPILLARY
GLUCOSE-CAPILLARY: 84 mg/dL (ref 70–99)
GLUCOSE-CAPILLARY: 106 mg/dL — AB (ref 70–99)
Glucose-Capillary: 88 mg/dL (ref 70–99)
Glucose-Capillary: 106 mg/dL — ABNORMAL HIGH (ref 70–99)

## 2018-03-24 LAB — BASIC METABOLIC PANEL
Anion gap: 7 (ref 5–15)
BUN: 8 mg/dL (ref 6–20)
CO2: 24 mmol/L (ref 22–32)
CREATININE: 1.15 mg/dL — AB (ref 0.44–1.00)
Calcium: 8.3 mg/dL — ABNORMAL LOW (ref 8.9–10.3)
Chloride: 106 mmol/L (ref 98–111)
GFR calc Af Amer: 59 mL/min — ABNORMAL LOW (ref >60)
GFR, EST NON AFRICAN AMERICAN: 51 mL/min — AB (ref >60)
GLUCOSE: 100 mg/dL — AB (ref 70–99)
Potassium: 3.7 mmol/L (ref 3.5–5.1)
SODIUM: 137 mmol/L (ref 135–145)

## 2018-03-24 LAB — POTASSIUM: POTASSIUM: 3.7 mmol/L (ref 3.5–5.1)

## 2018-03-24 MED ORDER — ASPIRIN 81 MG PO TBEC: 81.0000 mg | DELAYED_RELEASE_TABLET | Freq: Every day | ORAL | Status: DC

## 2018-03-24 MED ORDER — CYANOCOBALAMIN 1000 MCG PO TABS: 1000.0000 ug | ORAL_TABLET | Freq: Every day | ORAL | Status: DC

## 2018-03-24 MED ORDER — LEVOTHYROXINE SODIUM 200 MCG PO TABS: 200.0000 ug | ORAL_TABLET | Freq: Every day | ORAL | Status: DC

## 2018-03-24 MED ORDER — DILTIAZEM HCL ER 120 MG PO CP12: 120.0000 mg | ORAL_CAPSULE | Freq: Three times a day (TID) | ORAL | Status: DC

## 2018-03-24 MED ORDER — LISINOPRIL 20 MG PO TABS: 20.0000 mg | ORAL_TABLET | Freq: Two times a day (BID) | ORAL | Status: DC

## 2018-03-24 MED ORDER — ATORVASTATIN CALCIUM 40 MG PO TABS: 40.0000 mg | ORAL_TABLET | Freq: Every day | ORAL | Status: DC

## 2018-03-24 MED ORDER — BUTALBITAL-APAP-CAFFEINE 50-325-40 MG PO TABS: 1.0000 | ORAL_TABLET | Freq: Three times a day (TID) | ORAL | 0 refills | Status: DC | PRN

## 2018-03-24 NOTE — Plan of Care (Signed)
Progressing. Transfer to SNF

## 2018-03-24 NOTE — Social Work (Addendum)
CSW has received a top two choice from pt son: 1. [facility] 2. [facility]  [facility] is able to extend bed offer for today, [employer] aware and working on authorization. Spoke with pt at bedside, she is aware of what is going on and amenable to son's support and decision making help. Pt states that she understands that she will be transported by non emergency ambulance. Pt was being visited by one of her roommates and a visitor.   CSW will be able to support transfer once discharge summary available and authorization received.   CSW paged on call neurology PA.   Doy Hutching, LCSWA [facility] Clinical Social Work [phone]

## 2018-03-24 NOTE — Social Work (Addendum)
Pt has received insurance auth from [employer].  7 days approved for auth [id number]  Clinical Social Worker facilitated patient discharge including contacting patient family and facility to confirm patient discharge plans.  Clinical information faxed to facility and family agreeable with plan.  CSW arranged ambulance transport via [facility] to [facility] rm [address].   RN to call [phone] with report prior to discharge.  Clinical Social Worker will sign off for now as social work intervention is no longer needed. Please consult [place] again if new need arises.  Doy Hutching, [place] Clinical Social Worker [phone]

## 2018-03-24 NOTE — Social Work (Addendum)
Pt PASSR is [id number] E. CSW following to support discharge to SNF when medically appropriate. CSW left HIPPA compliant voicemail on pt's sons voicemail to get pt choice for SNF.  CSW started insurance auth with [employer].   Continuing to follow.  Doy Hutching, LCSWA [facility] Clinical Social Work [phone]

## 2018-03-24 NOTE — Progress Notes (Signed)
  Speech Language Pathology Treatment: Cognitive-Linquistic  Patient Details Name: LETISHIA GALINATO MRN: [id number] DOB: [birth year] Today's Date: 03/24/2018 Time: 5329-9242 SLP Time Calculation (min) (ACUTE ONLY): 13 min  Assessment / Plan / Recommendation Clinical Impression  Skilled treatment session focused on cognition. SLP facilitated session by providing Max A cues for sustained attention to task and to use call bell for nursing assist with headache. Attempted some basic ADLs, washing face etc pt but is not able to attend to task safely d/t attention to headache. Continue per current plan of care.    HPI HPI: 62 year old female with a history of hypertension, anticoagulation who presents with intraventricular hemorrhage likely secondary to hypertension and anticoagulation.      SLP Plan  Continue with current plan of care       Recommendations                   Oral Care Recommendations: Oral care BID Follow up Recommendations: Skilled Nursing facility SLP Visit Diagnosis: Frontal lobe and executive function deficit Frontal lobe and executive function deficit following: Other Nontraumatic ICH Plan: Continue with current plan of care       GO                Olivette Beckmann 03/24/2018, 10:39 AM

## 2018-03-24 NOTE — Discharge Summary (Addendum)
Stroke Discharge Summary  Patient ID: [name]     MRN: [id number]  DOB: 1956/08/15  Date of Admission: 03/18/2018 Date of Discharge: 03/24/2018  Attending Physician:  Marvel Plan, MD, Stroke MD Consultant(s):   Donalee Citrin, MD (neurosurgery), Tobias Alexander, MD (Lbcardiology, Rounding, MD)  Patient's PCP:  Madelin Headings, MD  DISCHARGE DIAGNOSIS:  Principal Problem:   IVH (intraventricular hemorrhage) [facility]) Active Problems:   Hypothyroidism   OSA- C-pap intol   CAD '07, LAD PCI 2012, SVG-PDA PTCA 11/10/14   Cardiomyopathy-h/o tachycardia mediated-EF 65% per echo March 2016   Hyperlipidemia   Atrial fibrillation with RVR (HCC)   History of stroke   Mood disorder (HCC)   HTN (hypertension)   Smoker   Hypokalemia   Demand ischemia secondary to AF with RVR   Chronic diastolic CHF (congestive heart failure), NYHA class 2 (HCC)   Generalized headaches   Past Medical History:  Diagnosis Date  . Acute right MCA stroke (HCC) 11/07/10  . Atrial fibrillation (HCC)    a. s/p TEE-DCCV 02/2104; b. Xarelto started  . CAD (coronary artery disease)    a.  cath 09/2010: LAD stent patent, S-Int/dCFX ok, S-PDA ok, L-LAD atretic;  b. Lexiscan Myoview (02/2014):  no ischemia, EF 55%; c. 11/2014 Cath/PCI: LM nl, LAD 20pISR, LCX 80-62m, OM1 nl, RI 70p, RCA 40-5m, RPDA 95ost/95-23m (PTCA only w/ reduction to 50p/35m), 60d, L->LAD atretic, VG->RI->OM nl, VG->PDA 100p.  . Cardiomyopathy with EF 40% at TEE 02/17/14 (likely tachycardia mediated - Myoview 02/19/14 neg for ischemia with normal EF) 02/18/2014  . COPD (chronic obstructive pulmonary disease) (HCC)   . Depression   . Eczema   . GERD (gastroesophageal reflux disease)   . Headache   . HLD (hyperlipidemia)   . Homelessness 11/12/2011  . 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 Hx of CABG   . Hx of transesophageal echocardiography (TEE) for monitoring 11/2010    TEE 11/2010: EF 60-65%, BAE, trivial atrial septal shunt;  right heart cath in 10/2010 with elevated R and L heart pressures and diuretic started  . Hypertension   . Hypothyroidism   . Persistent atrial fibrillation (HCC) 10/2014  . Pulmonary nodules    repeat CT due in 11/2011  . Sleep apnea    recent sleep study  in 04/2014 per chart review  shows no significant OSA   Past Surgical History:  Procedure Laterality Date  . bilateral knee surgery    . BLADDER SURGERY    . CARDIOVERSION N/A 02/17/2014   Procedure: CARDIOVERSION;  Surgeon: Pricilla Riffle, MD;  Location: [facility] ENDOSCOPY;  Service: Cardiovascular;  Laterality: N/A;  . CARDIOVERSION N/A 09/20/2016   Procedure: CARDIOVERSION;  Surgeon: Lars Masson, MD;  Location: [facility] ENDOSCOPY;  Service: Cardiovascular;  Laterality: N/A;  . cath 2012    . CHEST WALL RECONSTRUCTION    . CORONARY ARTERY BYPASS GRAFT    . debriment for infection in chest    . HERNIA REPAIR    . LEFT AND RIGHT HEART CATHETERIZATION WITH CORONARY ANGIOGRAM N/A 11/10/2014   Procedure: LEFT AND RIGHT HEART CATHETERIZATION WITH CORONARY ANGIOGRAM;  Surgeon: Marykay Lex, MD;  Location: [facility] CATH LAB;  Service: Cardiovascular;  Laterality: N/A;  . Left mastoidectomy    . TEE WITHOUT CARDIOVERSION N/A 02/17/2014   Procedure: TRANSESOPHAGEAL ECHOCARDIOGRAM (TEE);  Surgeon: Pricilla Riffle,  MD;  Location: [facility] ENDOSCOPY;  Service: Cardiovascular;  Laterality: N/A;    Allergies as of 03/24/2018      Reactions   Avelox [moxifloxacin Hcl In Nacl] Other (See Comments)   Mental breakdown.   Pamelor [nortriptyline Hcl] Other (See Comments)   Made her want to hurt herself   Amoxicillin Hives   Hydrocodone Itching   Oxycodone Itching   Penicillins Hives      Sulfa Antibiotics Other (See Comments)   unknown      Medication List    STOP taking these medications   clopidogrel 75 MG tablet Commonly known as:  PLAVIX   nitroGLYCERIN 0.4 MG SL tablet Commonly known as:   NITROSTAT   ondansetron 4 MG disintegrating tablet Commonly known as:  ZOFRAN-ODT   potassium chloride SA 20 MEQ tablet Commonly known as:  K-DUR,KLOR-CON   rivaroxaban 20 MG Tabs tablet Commonly known as:  XARELTO   Tiotropium Bromide Monohydrate 2.5 MCG/ACT Aers Commonly known as:  SPIRIVA RESPIMAT     TAKE these medications   aspirin 81 MG EC tablet Take 1 tablet (81 mg total) by mouth daily. Start taking on:  03/25/2018   atorvastatin 40 MG tablet Commonly known as:  LIPITOR Take 1 tablet (40 mg total) by mouth daily at 6 PM. What changed:    medication strength  how much to take  when to take this   buPROPion 300 MG 24 hr tablet Commonly known as:  WELLBUTRIN XL Take 1 tablet (300 mg total) by mouth every morning.   butalbital-acetaminophen-caffeine 50-325-40 MG tablet Commonly known as:  FIORICET, ESGIC Take 1 tablet by mouth every 8 (eight) hours as needed for headache.   cyanocobalamin 1000 MCG tablet Take 1 tablet (1,000 mcg total) by mouth daily. Start taking on:  03/25/2018   diltiazem 120 MG 12 hr capsule Commonly known as:  CARDIZEM SR Take 1 capsule (120 mg total) by mouth 3 (three) times daily.   escitalopram 10 MG tablet Commonly known as:  LEXAPRO Take 1 tablet (10 mg total) by mouth daily.   fluticasone 50 MCG/ACT nasal spray Commonly known as:  FLONASE Place 2 sprays into both nostrils daily.   levothyroxine 200 MCG tablet Commonly known as:  SYNTHROID Take 1 tablet (200 mcg total) by mouth daily before breakfast. Start taking on:  03/25/2018 What changed:    medication strength  how much to take   lisinopril 20 MG tablet Commonly known as:  PRINIVIL,ZESTRIL Take 1 tablet (20 mg total) by mouth 2 (two) times daily.   magnesium oxide 400 MG tablet Commonly known as:  MAG-OX Take 1 tablet (400 mg total) by mouth daily.   PROAIR HFA 108 (90 Base) MCG/ACT inhaler Generic drug:  albuterol INHALE 2 PUFFS BY MOUTH EVERY 6 HOURS AS  NEEDED FOR SHORTNESS OF BREATH   ranitidine 150 MG tablet Commonly known as:  ZANTAC Take 1 tablet (150 mg total) by mouth 2 (two) times daily. What changed:    when to take this  reasons to take this   topiramate 50 MG tablet Commonly known as:  TOPAMAX Take 1 tablet (50 mg total) by mouth 2 (two) times daily.        LABORATORY STUDIES CBC    Component Value Date/Time   WBC 7.6 03/24/2018 0520   RBC 3.36 (L) 03/24/2018 0520   HGB 11.3 (L) 03/24/2018 0520   HGB 12.8 09/13/2016 1315   HCT 33.9 (L) 03/24/2018 0520   HCT 37.9 09/13/2016 1315  PLT 197 03/24/2018 0520   PLT 201 09/13/2016 1315   MCV 100.9 (H) 03/24/2018 0520   MCV 95 09/13/2016 1315   MCH 33.6 03/24/2018 0520   MCHC 33.3 03/24/2018 0520   RDW 13.2 03/24/2018 0520   RDW 14.4 09/13/2016 1315   LYMPHSABS 1.1 03/18/2018 0658   LYMPHSABS 1.6 09/13/2016 1315   MONOABS 0.4 03/18/2018 0658   EOSABS 0.1 03/18/2018 0658   EOSABS 0.2 09/13/2016 1315   BASOSABS 0.1 03/18/2018 0658   BASOSABS 0.0 09/13/2016 1315   CMP    Component Value Date/Time   NA 137 03/24/2018 0520   NA 144 03/27/2017 1000   K 3.7 03/24/2018 0520   CL 106 03/24/2018 0520   CO2 24 03/24/2018 0520   GLUCOSE 100 (H) 03/24/2018 0520   BUN 8 03/24/2018 0520   BUN 22 03/27/2017 1000   CREATININE 1.15 (H) 03/24/2018 0520   CREATININE 1.07 03/31/2015 1306   CALCIUM 8.3 (L) 03/24/2018 0520   PROT 7.7 03/18/2018 0658   PROT 6.3 09/13/2016 1315   ALBUMIN 4.0 03/18/2018 0658   ALBUMIN 3.9 09/13/2016 1315   AST 23 03/18/2018 0658   ALT 9 03/18/2018 0658   ALKPHOS 64 03/18/2018 0658   BILITOT 1.4 (H) 03/18/2018 0658   BILITOT 0.2 09/13/2016 1315   GFRNONAA 51 (L) 03/24/2018 0520   GFRNONAA 58 (L) 03/31/2015 1306   GFRAA 59 (L) 03/24/2018 0520   GFRAA 67 03/31/2015 1306   Lipid Panel    Component Value Date/Time   CHOL 223 (H) 03/21/2018 0650   TRIG 137 03/21/2018 0650   HDL 48 03/21/2018 0650   CHOLHDL 4.6 03/21/2018 0650   VLDL  27 03/21/2018 0650   LDLCALC 148 (H) 03/21/2018 0650   HgbA1C  Lab Results  Component Value Date   HGBA1C 5.1 03/21/2018   Urinalysis    Component Value Date/Time   COLORURINE YELLOW 03/18/2018 0840   APPEARANCEUR CLEAR 03/18/2018 0840   LABSPEC 1.011 03/18/2018 0840   PHURINE 7.0 03/18/2018 0840   GLUCOSEU NEGATIVE 03/18/2018 0840   HGBUR NEGATIVE 03/18/2018 0840   BILIRUBINUR NEGATIVE 03/18/2018 0840   BILIRUBINUR neg 05/11/2014 2056   KETONESUR NEGATIVE 03/18/2018 0840   PROTEINUR 100 (A) 03/18/2018 0840   UROBILINOGEN 1.0 04/01/2015 1530   NITRITE NEGATIVE 03/18/2018 0840   LEUKOCYTESUR NEGATIVE 03/18/2018 0840   Urine Drug Screen     Component Value Date/Time   LABOPIA NONE DETECTED 03/18/2018 0840   COCAINSCRNUR NONE DETECTED 03/18/2018 0840   LABBENZ NONE DETECTED 03/18/2018 0840   AMPHETMU NONE DETECTED 03/18/2018 0840   THCU NONE DETECTED 03/18/2018 0840   LABBARB (A) 03/18/2018 0840    Result not available. Reagent lot number recalled by manufacturer.     SIGNIFICANT DIAGNOSTIC STUDIES Ct Head Wo Contrast 03/19/2018  IMPRESSION:  1. Unchanged intraventricular hemorrhage. Stable ventricular volume without hydrocephalus.  2. Remote right MCA branch infarct.   Mr Lodema Pilot Contrast 03/20/2018 IMPRESSION:  1. Intraventricular hemorrhage of varying ages.  No hydrocephalus.  2. Subcentimeter acute versus subacute RIGHT cerebrum infarcts, possibly artifact.  3. Old RIGHT MCA territory infarct.  4. Old small LEFT cerebellar infarct. Moderate chronic small vessel ischemic changes.   MR MRA Head WO Contrast 03/22/2018 IMPRESSION: 1. No aneurysm, large vessel occlusion or high-grade stenosis. 2. Unchanged distribution of intraventricular blood, predominantly located in the left lateral ventricle. Size and configuration of the ventricles are unchanged.  CUS - 1-39% ICA plaquing. Vertebral artery flow is antegrade.  2D Echocardiogram  -  Left ventricle: The  cavity size was normal. Wall thickness wasincreased in a pattern of moderate LVH. Systolic function wasnormal. The estimated ejection fraction was in the range of 55%to 60%. - Left atrium: The atrium was mildly dilated.     HISTORY OF PRESENT ILLNESS [name] is a 62 y.o. female with a history of atrial fibrillation on Xarelto, hypertension who presents with intraventricular hemorrhage.  She states that she started having a headache yesterday evening 03/17/2018.  She denies diplopia, numbness, weakness, but does endorse some unsteadiness when she was walking.  She has a left sided facial droop and left-sided weakness that is been baseline since the stroke 8 years ago. ICH Score: 0. Modified Rankin Scale: 0-Completely asymptomatic and back to baseline post- stroke.  She was admitted to the neuro ICU.   HOSPITAL COURSE [name] is a 62 y.o. female with history of AF on Xarelto, HTN, CAD s/p CABG, HLD, prior stroke, and tobacco use presenting with HA. found to have IVH secondary to HTN and Xarelto coagulopathy. Xarelto was reversed and stopped, plavix stopped. Once stable post hemorrhage, was started on low dose aspirin. Plan check CT in 3-4 weeks. If IVH resolving, will place on eliquis and stop aspirin. Neurology will arrange at time of followup. Therapy recommends SNF at time of d/c.   IVH - secondary to HTN and Xarelto coagulopathy - Xarelto reversed with Andexxa   Consulted neurosurgery, not a surgical candidate  CT head - L lateral, 3rd ventricle with extension R frontal horn IVH, 3mm midline shift. Old R frontal/temporal/lentiform nucleus infarcts.   Repeat CT head - stable, old R MCA infarct.   MRI with and without contrast - stable IVH, No hydrocephalus. Old RIGHT MCA territory infarct. Old small LEFT cerebellar infarct.   MRA head - no AVM or aneurysm  CUS - unremarkable  2D Echo - EF 55% to 60%.  clopidogrel 75 mg daily and Xarelto (rivaroxaban) daily (pt  questions if she was taking plavix, she is not sure) prior to admission, now on No antithrombotic given IVH. Will start low dose aspirin today. Plan follow up CT in 3-4 weeks, if IVH resolves, consider to resume DOACs with eliquis.   Therapy recommendations: SNF.   Disposition:  Discharge to skilled nursing facility for ongoing PT, OT and ST.   Atrial Fibrillation w/ RVR  Home anticoagulation:  Xarelto (rivaroxaban) daily   Xarelto reversed with Andexxa on admission  afib. Developed RVR in hospital for which cardiology was consulted. Started on cardizem for rate control. RVR resolved  Follow up CT in 3-4 weeks, if IVH resolves, consider to resume DOACs with eliquis.   Hypertensive Emergency  BP 199/120 on arrival  Treated with Cleviprex  Home Meds: none  On lisinopril now. Continue at d/d.  Long-term BP goal normotensive  Hyperlipidemia  Home meds:  Lipitor 20  LDL 148  Increased to lipitor 40  Continue statin at discharge  Tobacco abuse  Current smoker  Smoking cessation counseling provided  Nicotine patch allergy and nicotine gum side effect (N/V)  Pt is willing to quit  Other Stroke Risk Factors  Advanced age  Hx stroke/TIA ? 11/2010 - R MCA infarct with residual mild L HP, embolic. TEE neg ? 02/2011 - TIA w/ transient dysarthria d/t decreased perfusion from bradycardia and hypotension  Coronary artery disease s/p CABG, stents - on plavix due to CAD  Hx chronic diastolic CHF  Chronic HA, on topamax. Followed by Dr. Pearlean Brownie  Obstructive  sleep apnea intolerant to CPAP  Other Active Problems  Hx tremor, now resolved. saw neurologist in [facility]  Hypokalemia, resolved - 2.6->4.0 - supplemented   B12 low normal - on supplement   Neck pain, not rigid, cannot elicit pain. Refused tylenol.   DISCHARGE EXAM Blood pressure (!) 139/103, pulse 89, temperature 97.9 F (36.6 C), temperature source Oral, resp. rate 20, height 5\' 4"  (1.626 m), weight 77.7  kg (171 lb 4.8 oz), SpO2 98 %. Pleasant middle-aged Caucasian lady currently not in distress. Afebrile. Head is nontraumatic. Neck is supple without bruit, no elicited pain with ROM movement.  Cardiac exam no murmur or gallop. Lungs are clear to auscultation. Distal pulses are well felt.  Neurological Exam : Awake alert oriented x 3 normal mild dysarthria. PERRL, EOMI, visual field full. Mild left lower face asymmetry. Tongue midline. No drift. Mild diminished fine finger movements on left. Orbits right over left upper extremity. Mild left grip weak Mild left hip flexor weakness. Normal sensation. Normal coordination.  Discharge Diet   Regular diet, thin liquids  DISCHARGE PLAN  Disposition:  skilled nursing facility for ongoing PT, OT and ST.   aspirin 81 mg daily for secondary stroke prevention.   If imaging in 3-4 weeks shows resolved IVH, start eliquis and stop aspirin  Ongoing risk factor control by Primary Care Physician at time of discharge  Follow-up Panosh, Neta Mends, MD in 2 weeks.  Follow-up in [facility] in 4 weeks, office to schedule an appointment.   35 minutes were spent preparing discharge.  Annie Main, MSN, APRN, ANVP-BC, AGPCNP-BC Advanced Practice Stroke Nurse [facility] See Amion for Schedule & Pager information 03/24/2018 2:53 PM    ATTENDING NOTE: I reviewed above note and agree with the assessment and plan. I have made any additions or clarifications directly to the above note. Pt was seen and examined.   No acute event overnight. Pt HA resolved. No complains. Will arrange for SNF discharge with [facility]. He will follow up with stroke clinic in 3-4 week to repeat CT head. If blood absorbed, will restart AC with eliquis.   Marvel Plan, MD PhD Stroke Neurology 03/25/2018 11:42 AM

## 2018-03-24 NOTE — Clinical Social Work Placement (Signed)
   CLINICAL SOCIAL WORK PLACEMENT  NOTE [facility]  Date:  03/24/2018  Patient Details  Name: [name] MRN: [id number] Date of Birth: 04/11/1956  Clinical Social Work is seeking post-discharge placement for this patient at the Skilled  Nursing Facility level of care (*CSW will initial, date and re-position this form in  chart as items are completed):  Yes(Emailed to son)   Patient/family provided with [facility] Clinical Social Work Department's list of facilities offering this level of care within the geographic area requested by the patient (or if unable, by the patient's family).  Yes   Patient/family informed of their freedom to choose among providers that offer the needed level of care, that participate in Medicare, Medicaid or managed care program needed by the patient, have an available bed and are willing to accept the patient.  Yes   Patient/family informed of [facility]'s ownership interest in [facility] and [facility], as well as of the fact that they are under no obligation to receive care at these facilities.  PASRR submitted to EDS on 03/23/18     PASRR number received on  03/23/2018     Existing PASRR number confirmed on       FL2 transmitted to all facilities in geographic area requested by pt/family on 03/23/18     FL2 transmitted to all facilities within larger geographic area on       Patient informed that his/her managed care company has contracts with or will negotiate with certain facilities, including the following:        Yes   Patient/family informed of bed offers received.  Patient chooses bed at [facility]     Physician recommends and patient chooses bed at      Patient to be transferred to [facility] on 03/24/18.  Patient to be transferred to facility by [facility]     Patient family notified on 03/24/18 of transfer.  Name of family member notified:  son, [name]     PHYSICIAN Please prepare priority  discharge summary, including medications, Please prepare prescriptions     Additional Comment:    _______________________________________________ Doy Hutching, LCSWA 03/24/2018, 1:30 PM

## 2018-03-24 NOTE — Progress Notes (Signed)
EMS at bedside to transport. PRN dose of labetolol administered. Belongings taken with patient.

## 2018-03-24 NOTE — Progress Notes (Signed)
Gave report to Hansel Starling, RN at [facility].

## 2018-03-25 ENCOUNTER — Emergency Department (HOSPITAL_COMMUNITY): Payer: PPO

## 2018-03-25 ENCOUNTER — Other Ambulatory Visit: Payer: Self-pay

## 2018-03-25 ENCOUNTER — Encounter (HOSPITAL_COMMUNITY): Payer: Self-pay

## 2018-03-25 ENCOUNTER — Inpatient Hospital Stay (HOSPITAL_COMMUNITY)
Admission: EM | Admit: 2018-03-25 | Discharge: 2018-04-01 | DRG: 305 | Disposition: A | Discharge status: Skilled Nursing Facility | Payer: PPO | Source: Skilled Nursing Facility | Attending: Internal Medicine | Admitting: Internal Medicine

## 2018-03-25 DIAGNOSIS — R9431 Abnormal electrocardiogram [ECG] [EKG]: Secondary | ICD-10-CM | POA: Diagnosis not present

## 2018-03-25 DIAGNOSIS — G4733 Obstructive sleep apnea (adult) (pediatric): Secondary | ICD-10-CM | POA: Diagnosis not present

## 2018-03-25 DIAGNOSIS — Z9114 Patient's other noncompliance with medication regimen: Secondary | ICD-10-CM | POA: Diagnosis not present

## 2018-03-25 DIAGNOSIS — I214 Non-ST elevation (NSTEMI) myocardial infarction: Secondary | ICD-10-CM | POA: Insufficient documentation

## 2018-03-25 DIAGNOSIS — I110 Hypertensive heart disease with heart failure: Secondary | ICD-10-CM | POA: Diagnosis present

## 2018-03-25 DIAGNOSIS — Z7951 Long term (current) use of inhaled steroids: Secondary | ICD-10-CM

## 2018-03-25 DIAGNOSIS — F329 Major depressive disorder, single episode, unspecified: Secondary | ICD-10-CM | POA: Diagnosis present

## 2018-03-25 DIAGNOSIS — R748 Abnormal levels of other serum enzymes: Secondary | ICD-10-CM | POA: Diagnosis not present

## 2018-03-25 DIAGNOSIS — I2510 Atherosclerotic heart disease of native coronary artery without angina pectoris: Secondary | ICD-10-CM | POA: Diagnosis not present

## 2018-03-25 DIAGNOSIS — R7989 Other specified abnormal findings of blood chemistry: Secondary | ICD-10-CM

## 2018-03-25 DIAGNOSIS — Z885 Allergy status to narcotic agent status: Secondary | ICD-10-CM

## 2018-03-25 DIAGNOSIS — J439 Emphysema, unspecified: Secondary | ICD-10-CM | POA: Diagnosis not present

## 2018-03-25 DIAGNOSIS — Z825 Family history of asthma and other chronic lower respiratory diseases: Secondary | ICD-10-CM

## 2018-03-25 DIAGNOSIS — G4489 Other headache syndrome: Secondary | ICD-10-CM | POA: Diagnosis not present

## 2018-03-25 DIAGNOSIS — R51 Headache: Secondary | ICD-10-CM | POA: Diagnosis present

## 2018-03-25 DIAGNOSIS — Z818 Family history of other mental and behavioral disorders: Secondary | ICD-10-CM

## 2018-03-25 DIAGNOSIS — Z9861 Coronary angioplasty status: Secondary | ICD-10-CM | POA: Diagnosis not present

## 2018-03-25 DIAGNOSIS — I248 Other forms of acute ischemic heart disease: Secondary | ICD-10-CM | POA: Diagnosis not present

## 2018-03-25 DIAGNOSIS — G44221 Chronic tension-type headache, intractable: Secondary | ICD-10-CM | POA: Diagnosis not present

## 2018-03-25 DIAGNOSIS — R001 Bradycardia, unspecified: Secondary | ICD-10-CM | POA: Diagnosis not present

## 2018-03-25 DIAGNOSIS — Z951 Presence of aortocoronary bypass graft: Secondary | ICD-10-CM | POA: Diagnosis not present

## 2018-03-25 DIAGNOSIS — I69154 Hemiplegia and hemiparesis following nontraumatic intracerebral hemorrhage affecting left non-dominant side: Secondary | ICD-10-CM | POA: Diagnosis not present

## 2018-03-25 DIAGNOSIS — M2550 Pain in unspecified joint: Secondary | ICD-10-CM | POA: Diagnosis not present

## 2018-03-25 DIAGNOSIS — E785 Hyperlipidemia, unspecified: Secondary | ICD-10-CM | POA: Diagnosis present

## 2018-03-25 DIAGNOSIS — L309 Dermatitis, unspecified: Secondary | ICD-10-CM | POA: Diagnosis not present

## 2018-03-25 DIAGNOSIS — Z880 Allergy status to penicillin: Secondary | ICD-10-CM

## 2018-03-25 DIAGNOSIS — F17210 Nicotine dependence, cigarettes, uncomplicated: Secondary | ICD-10-CM | POA: Diagnosis not present

## 2018-03-25 DIAGNOSIS — F39 Unspecified mood [affective] disorder: Secondary | ICD-10-CM | POA: Diagnosis not present

## 2018-03-25 DIAGNOSIS — Z743 Need for continuous supervision: Secondary | ICD-10-CM | POA: Diagnosis not present

## 2018-03-25 DIAGNOSIS — Z882 Allergy status to sulfonamides status: Secondary | ICD-10-CM

## 2018-03-25 DIAGNOSIS — E43 Unspecified severe protein-calorie malnutrition: Secondary | ICD-10-CM | POA: Diagnosis not present

## 2018-03-25 DIAGNOSIS — Z6829 Body mass index (BMI) 29.0-29.9, adult: Secondary | ICD-10-CM

## 2018-03-25 DIAGNOSIS — R0789 Other chest pain: Secondary | ICD-10-CM | POA: Diagnosis not present

## 2018-03-25 DIAGNOSIS — Z8673 Personal history of transient ischemic attack (TIA), and cerebral infarction without residual deficits: Secondary | ICD-10-CM

## 2018-03-25 DIAGNOSIS — I429 Cardiomyopathy, unspecified: Secondary | ICD-10-CM | POA: Diagnosis not present

## 2018-03-25 DIAGNOSIS — Z79890 Hormone replacement therapy (postmenopausal): Secondary | ICD-10-CM

## 2018-03-25 DIAGNOSIS — G8190 Hemiplegia, unspecified affecting unspecified side: Secondary | ICD-10-CM | POA: Diagnosis not present

## 2018-03-25 DIAGNOSIS — I480 Paroxysmal atrial fibrillation: Secondary | ICD-10-CM | POA: Diagnosis not present

## 2018-03-25 DIAGNOSIS — R279 Unspecified lack of coordination: Secondary | ICD-10-CM | POA: Diagnosis not present

## 2018-03-25 DIAGNOSIS — I69198 Other sequelae of nontraumatic intracerebral hemorrhage: Secondary | ICD-10-CM | POA: Diagnosis not present

## 2018-03-25 DIAGNOSIS — R414 Neurologic neglect syndrome: Secondary | ICD-10-CM | POA: Diagnosis present

## 2018-03-25 DIAGNOSIS — R911 Solitary pulmonary nodule: Secondary | ICD-10-CM | POA: Diagnosis not present

## 2018-03-25 DIAGNOSIS — M6281 Muscle weakness (generalized): Secondary | ICD-10-CM | POA: Diagnosis not present

## 2018-03-25 DIAGNOSIS — I69192 Facial weakness following nontraumatic intracerebral hemorrhage: Secondary | ICD-10-CM | POA: Diagnosis not present

## 2018-03-25 DIAGNOSIS — E876 Hypokalemia: Secondary | ICD-10-CM | POA: Diagnosis not present

## 2018-03-25 DIAGNOSIS — I481 Persistent atrial fibrillation: Secondary | ICD-10-CM | POA: Diagnosis not present

## 2018-03-25 DIAGNOSIS — I63512 Cerebral infarction due to unspecified occlusion or stenosis of left middle cerebral artery: Secondary | ICD-10-CM | POA: Diagnosis not present

## 2018-03-25 DIAGNOSIS — E039 Hypothyroidism, unspecified: Secondary | ICD-10-CM | POA: Diagnosis present

## 2018-03-25 DIAGNOSIS — J309 Allergic rhinitis, unspecified: Secondary | ICD-10-CM | POA: Diagnosis not present

## 2018-03-25 DIAGNOSIS — F331 Major depressive disorder, recurrent, moderate: Secondary | ICD-10-CM

## 2018-03-25 DIAGNOSIS — J449 Chronic obstructive pulmonary disease, unspecified: Secondary | ICD-10-CM | POA: Diagnosis not present

## 2018-03-25 DIAGNOSIS — N183 Chronic kidney disease, stage 3 (moderate): Secondary | ICD-10-CM | POA: Diagnosis not present

## 2018-03-25 DIAGNOSIS — I21A1 Myocardial infarction type 2: Secondary | ICD-10-CM | POA: Diagnosis not present

## 2018-03-25 DIAGNOSIS — Z7982 Long term (current) use of aspirin: Secondary | ICD-10-CM

## 2018-03-25 DIAGNOSIS — I161 Hypertensive emergency: Principal | ICD-10-CM | POA: Diagnosis present

## 2018-03-25 DIAGNOSIS — K219 Gastro-esophageal reflux disease without esophagitis: Secondary | ICD-10-CM | POA: Diagnosis not present

## 2018-03-25 DIAGNOSIS — R4182 Altered mental status, unspecified: Secondary | ICD-10-CM | POA: Diagnosis not present

## 2018-03-25 DIAGNOSIS — R52 Pain, unspecified: Secondary | ICD-10-CM | POA: Diagnosis not present

## 2018-03-25 DIAGNOSIS — I5042 Chronic combined systolic (congestive) and diastolic (congestive) heart failure: Secondary | ICD-10-CM | POA: Diagnosis present

## 2018-03-25 DIAGNOSIS — I10 Essential (primary) hypertension: Secondary | ICD-10-CM | POA: Diagnosis not present

## 2018-03-25 DIAGNOSIS — Z888 Allergy status to other drugs, medicaments and biological substances status: Secondary | ICD-10-CM

## 2018-03-25 DIAGNOSIS — R0600 Dyspnea, unspecified: Secondary | ICD-10-CM | POA: Diagnosis not present

## 2018-03-25 DIAGNOSIS — I25810 Atherosclerosis of coronary artery bypass graft(s) without angina pectoris: Secondary | ICD-10-CM | POA: Diagnosis not present

## 2018-03-25 DIAGNOSIS — E669 Obesity, unspecified: Secondary | ICD-10-CM | POA: Diagnosis present

## 2018-03-25 DIAGNOSIS — T68XXXA Hypothermia, initial encounter: Secondary | ICD-10-CM | POA: Diagnosis not present

## 2018-03-25 DIAGNOSIS — I252 Old myocardial infarction: Secondary | ICD-10-CM | POA: Diagnosis not present

## 2018-03-25 DIAGNOSIS — R531 Weakness: Secondary | ICD-10-CM | POA: Diagnosis not present

## 2018-03-25 DIAGNOSIS — I5032 Chronic diastolic (congestive) heart failure: Secondary | ICD-10-CM | POA: Diagnosis not present

## 2018-03-25 DIAGNOSIS — R0602 Shortness of breath: Secondary | ICD-10-CM | POA: Diagnosis not present

## 2018-03-25 DIAGNOSIS — I639 Cerebral infarction, unspecified: Secondary | ICD-10-CM | POA: Diagnosis not present

## 2018-03-25 DIAGNOSIS — F332 Major depressive disorder, recurrent severe without psychotic features: Secondary | ICD-10-CM | POA: Diagnosis not present

## 2018-03-25 DIAGNOSIS — I615 Nontraumatic intracerebral hemorrhage, intraventricular: Secondary | ICD-10-CM | POA: Diagnosis not present

## 2018-03-25 DIAGNOSIS — Z79899 Other long term (current) drug therapy: Secondary | ICD-10-CM

## 2018-03-25 DIAGNOSIS — R778 Other specified abnormalities of plasma proteins: Secondary | ICD-10-CM | POA: Insufficient documentation

## 2018-03-25 LAB — URINALYSIS, ROUTINE W REFLEX MICROSCOPIC
BILIRUBIN URINE: NEGATIVE
Glucose, UA: NEGATIVE mg/dL
Hgb urine dipstick: NEGATIVE
Ketones, ur: NEGATIVE mg/dL
Leukocytes, UA: NEGATIVE
NITRITE: NEGATIVE
PH: 7 (ref 5.0–8.0)
Protein, ur: NEGATIVE mg/dL
SPECIFIC GRAVITY, URINE: 1.011 (ref 1.005–1.030)

## 2018-03-25 LAB — I-STAT VENOUS BLOOD GAS, ED
Acid-base deficit: 4 mmol/L — ABNORMAL HIGH (ref 0.0–2.0)
BICARBONATE: 22.2 mmol/L (ref 20.0–28.0)
O2 Saturation: 34 %
PH VEN: 7.313 (ref 7.250–7.430)
TCO2: 24 mmol/L (ref 22–32)
pCO2, Ven: 43.8 mmHg — ABNORMAL LOW (ref 44.0–60.0)
pO2, Ven: 23 mmHg — CL (ref 32.0–45.0)

## 2018-03-25 LAB — COMPREHENSIVE METABOLIC PANEL
ALT: 15 U/L (ref 0–44)
AST: 80 U/L — AB (ref 15–41)
Albumin: 3.9 g/dL (ref 3.5–5.0)
Alkaline Phosphatase: 64 U/L (ref 38–126)
Anion gap: 11 (ref 5–15)
BUN: 7 mg/dL (ref 6–20)
CHLORIDE: 100 mmol/L (ref 98–111)
CO2: 24 mmol/L (ref 22–32)
CREATININE: 1 mg/dL (ref 0.44–1.00)
Calcium: 9.2 mg/dL (ref 8.9–10.3)
GFR, EST NON AFRICAN AMERICAN: 60 mL/min — AB (ref >60)
Glucose, Bld: 100 mg/dL — ABNORMAL HIGH (ref 70–99)
POTASSIUM: 3.7 mmol/L (ref 3.5–5.1)
SODIUM: 135 mmol/L (ref 135–145)
Total Bilirubin: 1 mg/dL (ref 0.3–1.2)
Total Protein: 6.9 g/dL (ref 6.5–8.1)

## 2018-03-25 LAB — GLUCOSE, CAPILLARY: GLUCOSE-CAPILLARY: 110 mg/dL — AB (ref 70–99)

## 2018-03-25 LAB — TROPONIN I
TROPONIN I: 11.31 ng/mL — AB (ref <0.03)
TROPONIN I: 12.15 ng/mL — AB (ref <0.03)

## 2018-03-25 LAB — CBC
HCT: 39.9 % (ref 36.0–46.0)
Hemoglobin: 13.6 g/dL (ref 12.0–15.0)
MCH: 34 pg (ref 26.0–34.0)
MCHC: 34.1 g/dL (ref 30.0–36.0)
MCV: 99.8 fL (ref 78.0–100.0)
PLATELETS: 225 10*3/uL (ref 150–400)
RBC: 4 MIL/uL (ref 3.87–5.11)
RDW: 12.8 % (ref 11.5–15.5)
WBC: 9.8 10*3/uL (ref 4.0–10.5)

## 2018-03-25 LAB — BRAIN NATRIURETIC PEPTIDE: B Natriuretic Peptide: 608.8 pg/mL — ABNORMAL HIGH (ref 0.0–100.0)

## 2018-03-25 MED ORDER — ATORVASTATIN CALCIUM 40 MG PO TABS
40.0000 mg | ORAL_TABLET | Freq: Every day | ORAL | Status: DC
  Administered 2018-03-26 – 2018-03-31 (×6): 40 mg via ORAL
  Filled 2018-03-25 (×6): qty 1

## 2018-03-25 MED ORDER — VITAMIN B-12 1000 MCG PO TABS
1000.0000 ug | ORAL_TABLET | Freq: Every day | ORAL | Status: DC
  Administered 2018-03-26 – 2018-04-01 (×7): 1000 ug via ORAL
  Filled 2018-03-25 (×7): qty 1

## 2018-03-25 MED ORDER — ONDANSETRON HCL 4 MG/2ML IJ SOLN
4.0000 mg | Freq: Once | INTRAMUSCULAR | Status: AC
  Administered 2018-03-25: 4 mg via INTRAVENOUS
  Filled 2018-03-25: qty 2

## 2018-03-25 MED ORDER — LISINOPRIL 20 MG PO TABS
20.0000 mg | ORAL_TABLET | Freq: Two times a day (BID) | ORAL | Status: DC
  Administered 2018-03-25 – 2018-03-31 (×12): 20 mg via ORAL
  Filled 2018-03-25 (×12): qty 1

## 2018-03-25 MED ORDER — LEVOTHYROXINE SODIUM 100 MCG PO TABS
200.0000 ug | ORAL_TABLET | Freq: Every day | ORAL | Status: DC
  Administered 2018-03-26 – 2018-04-01 (×7): 200 ug via ORAL
  Filled 2018-03-25 (×3): qty 2
  Filled 2018-03-25: qty 1
  Filled 2018-03-25 (×2): qty 2
  Filled 2018-03-25: qty 1
  Filled 2018-03-25 (×2): qty 2

## 2018-03-25 MED ORDER — ESCITALOPRAM OXALATE 10 MG PO TABS
10.0000 mg | ORAL_TABLET | Freq: Every day | ORAL | Status: DC
  Administered 2018-03-26 – 2018-04-01 (×7): 10 mg via ORAL
  Filled 2018-03-25 (×7): qty 1

## 2018-03-25 MED ORDER — FAMOTIDINE 20 MG PO TABS
20.0000 mg | ORAL_TABLET | Freq: Every day | ORAL | Status: DC
  Administered 2018-03-26 – 2018-04-01 (×7): 20 mg via ORAL
  Filled 2018-03-25 (×7): qty 1

## 2018-03-25 MED ORDER — BUPROPION HCL ER (XL) 300 MG PO TB24
300.0000 mg | ORAL_TABLET | Freq: Every morning | ORAL | Status: DC
  Administered 2018-03-26 – 2018-03-28 (×3): 300 mg via ORAL
  Filled 2018-03-25 (×4): qty 1

## 2018-03-25 MED ORDER — FLUTICASONE PROPIONATE 50 MCG/ACT NA SUSP
2.0000 | Freq: Every day | NASAL | Status: DC
  Administered 2018-03-26 – 2018-03-30 (×5): 2 via NASAL
  Filled 2018-03-25: qty 16

## 2018-03-25 MED ORDER — NICOTINE 7 MG/24HR TD PT24
7.0000 mg | MEDICATED_PATCH | Freq: Every day | TRANSDERMAL | Status: DC
  Administered 2018-03-27: 7 mg via TRANSDERMAL
  Filled 2018-03-25 (×3): qty 1

## 2018-03-25 MED ORDER — SODIUM CHLORIDE 0.9 % IV SOLN
250.0000 mL | INTRAVENOUS | Status: DC | PRN
  Administered 2018-03-25: 250 mL via INTRAVENOUS

## 2018-03-25 MED ORDER — BUTALBITAL-APAP-CAFFEINE 50-325-40 MG PO TABS
1.0000 | ORAL_TABLET | Freq: Three times a day (TID) | ORAL | Status: DC | PRN
  Administered 2018-03-26 – 2018-03-29 (×2): 1 via ORAL
  Filled 2018-03-25 (×3): qty 1

## 2018-03-25 MED ORDER — TOPIRAMATE 25 MG PO TABS
50.0000 mg | ORAL_TABLET | Freq: Two times a day (BID) | ORAL | Status: DC
  Administered 2018-03-25 – 2018-04-01 (×13): 50 mg via ORAL
  Filled 2018-03-25 (×14): qty 2

## 2018-03-25 MED ORDER — MAGNESIUM OXIDE 400 (241.3 MG) MG PO TABS
400.0000 mg | ORAL_TABLET | Freq: Every day | ORAL | Status: DC
  Administered 2018-03-26 – 2018-04-01 (×7): 400 mg via ORAL
  Filled 2018-03-25 (×7): qty 1

## 2018-03-25 MED ORDER — CLEVIDIPINE BUTYRATE 0.5 MG/ML IV EMUL
0.0000 mg/h | INTRAVENOUS | Status: DC
  Administered 2018-03-25: 1 mg/h via INTRAVENOUS
  Administered 2018-03-26: 3 mg/h via INTRAVENOUS
  Filled 2018-03-25 (×3): qty 50

## 2018-03-25 MED ORDER — ASPIRIN EC 81 MG PO TBEC
81.0000 mg | DELAYED_RELEASE_TABLET | Freq: Every day | ORAL | Status: DC
  Administered 2018-03-25 – 2018-04-01 (×8): 81 mg via ORAL
  Filled 2018-03-25 (×8): qty 1

## 2018-03-25 MED ORDER — LABETALOL HCL 5 MG/ML IV SOLN
20.0000 mg | Freq: Once | INTRAVENOUS | Status: AC
  Administered 2018-03-25: 20 mg via INTRAVENOUS
  Filled 2018-03-25: qty 4

## 2018-03-25 MED ORDER — DILTIAZEM HCL ER 60 MG PO CP12
120.0000 mg | ORAL_CAPSULE | Freq: Three times a day (TID) | ORAL | Status: DC
  Administered 2018-03-26 – 2018-03-30 (×15): 120 mg via ORAL
  Filled 2018-03-25 (×17): qty 2

## 2018-03-25 MED ORDER — NICARDIPINE HCL IN NACL 20-0.86 MG/200ML-% IV SOLN
3.0000 mg/h | Freq: Once | INTRAVENOUS | Status: DC
  Filled 2018-03-25 (×2): qty 200

## 2018-03-25 MED ORDER — LABETALOL HCL 5 MG/ML IV SOLN
10.0000 mg | Freq: Once | INTRAVENOUS | Status: AC
  Administered 2018-03-25: 10 mg via INTRAVENOUS
  Filled 2018-03-25: qty 4

## 2018-03-25 MED ORDER — METOPROLOL TARTRATE 12.5 MG HALF TABLET
12.5000 mg | ORAL_TABLET | Freq: Two times a day (BID) | ORAL | Status: DC
  Administered 2018-03-25 – 2018-03-26 (×2): 12.5 mg via ORAL
  Filled 2018-03-25 (×2): qty 1

## 2018-03-25 NOTE — H&P (Signed)
PULMONARY / CRITICAL CARE MEDICINE   Name: [name] MRN: [id number] DOB: 09/13/1955    ADMISSION DATE:  03/25/2018 CONSULTATION DATE:  03/25/18  REFERRING MD:  Erma Heritage  CHIEF COMPLAINT:  HTN Urgency  HISTORY OF PRESENT ILLNESS:  Pt is encephelopathic; therefore, this HPI is obtained from chart review. [name] is a 62 y.o. female with PMH as outlined below. She had recent admission 03/18/18 through 03/24/18 for IVH felt to be due to HTN and xarelto use.  During that admission, xarelto was reversed and plavix was stopped.  Once hemorrhage was stable, low dose aspirin was resumed prior to discharge.  She was discharged 7/16 to SNF with plan to repeat CT head in 3 - 4 weeks and if IVH had resolved, then to start eliquis and stop aspirin.  Neurosurgery was also consulted but pt was felt to not be a surgical candidate.  On 7/17 while at SNF, pt had hypertension with SBP in 170s and up to 190s.  She was subsequently send to ED for further workup.  In ED, she complained of headache.  CT head was negative for new process.  She was started on cleviprex and PCCM was subsequently asked to admit to ICU.  She denies any diplopia, blurry vision, pre-syncope, syncope, chest pain, dyspnea, N/V/D, abd pain.   PAST MEDICAL HISTORY :  She  has a past medical history of Acute right MCA stroke (HCC) (11/07/10), Atrial fibrillation (HCC), CAD (coronary artery disease), Cardiomyopathy with EF 40% at TEE 02/17/14 (likely tachycardia mediated - Myoview 02/19/14 neg for ischemia with normal EF) (02/18/2014), COPD (chronic obstructive pulmonary disease) (HCC), Depression, Eczema, GERD (gastroesophageal reflux disease), Headache, HLD (hyperlipidemia), Homelessness (11/12/2011), HPV test positive, MRSA infection, CABG, transesophageal echocardiography (TEE) for monitoring (11/2010), Hypertension, Hypothyroidism, Persistent atrial fibrillation (HCC) (10/2014), Pulmonary nodules, and Sleep apnea.  PAST SURGICAL  HISTORY: She  has a past surgical history that includes Coronary artery bypass graft; debriment for infection in chest; Chest wall reconstruction; bilateral knee surgery; Bladder surgery; Left mastoidectomy; Hernia repair; cath 2012; TEE without cardioversion (N/A, 02/17/2014); Cardioversion (N/A, 02/17/2014); left and right heart catheterization with coronary angiogram (N/A, 11/10/2014); and Cardioversion (N/A, 09/20/2016).  Allergies  Allergen Reactions  . Avelox [Moxifloxacin Hcl In Nacl] Other (See Comments)    Mental breakdown.  [name] [Nortriptyline Hcl] Other (See Comments)    Made her want to hurt herself  . Amoxicillin Hives  . Hydrocodone Itching  . Oxycodone Itching  . Penicillins Hives       . Sulfa Antibiotics Other (See Comments)    unknown    No current facility-administered medications on file prior to encounter.    Current Outpatient Medications on File Prior to Encounter  Medication Sig  . aspirin EC 81 MG EC tablet Take 1 tablet (81 mg total) by mouth daily.  [name] atorvastatin (LIPITOR) 40 MG tablet Take 1 tablet (40 mg total) by mouth daily at 6 PM.  . buPROPion (WELLBUTRIN XL) 300 MG 24 hr tablet Take 1 tablet (300 mg total) by mouth every morning.  . butalbital-acetaminophen-caffeine (FIORICET, ESGIC) 50-325-40 MG tablet Take 1 tablet by mouth every 8 (eight) hours as needed for headache.  . diltiazem (CARDIZEM SR) 120 MG 12 hr capsule Take 1 capsule (120 mg total) by mouth 3 (three) times daily.  [name] escitalopram (LEXAPRO) 10 MG tablet Take 1 tablet (10 mg total) by mouth daily.  . fluticasone (FLONASE) 50 MCG/ACT nasal spray Place 2 sprays into both nostrils daily.  [name]  levothyroxine (SYNTHROID, LEVOTHROID) 200 MCG tablet Take 1 tablet (200 mcg total) by mouth daily before breakfast.  . lisinopril (PRINIVIL,ZESTRIL) 20 MG tablet Take 1 tablet (20 mg total) by mouth 2 (two) times daily.  . magnesium oxide (MAG-OX) 400 MG tablet Take 1 tablet (400 mg total) by mouth  daily.  [name] PROAIR HFA 108 (90 Base) MCG/ACT inhaler INHALE 2 PUFFS BY MOUTH EVERY 6 HOURS AS NEEDED FOR SHORTNESS OF BREATH  . ranitidine (ZANTAC) 150 MG tablet Take 1 tablet (150 mg total) by mouth 2 (two) times daily. (Patient taking differently: Take 150 mg by mouth 2 (two) times daily as needed for heartburn. )  . topiramate (TOPAMAX) 50 MG tablet Take 1 tablet (50 mg total) by mouth 2 (two) times daily.  . vitamin B-12 1000 MCG tablet Take 1 tablet (1,000 mcg total) by mouth daily.    FAMILY HISTORY:  Her indicated that her mother is deceased. She indicated that her father is deceased. She indicated that her sister is alive. She indicated that her maternal grandmother is deceased. She indicated that her maternal grandfather is deceased. She indicated that her paternal grandmother is deceased. She indicated that her paternal grandfather is deceased. She indicated that the status of her neg hx is unknown.   SOCIAL HISTORY: She  reports that she has been smoking cigarettes.  She has a 15.00 pack-year smoking history. She has never used smokeless tobacco. She reports that she does not drink alcohol or use drugs.  REVIEW OF SYSTEMS:   Unable to obtain as pt is encephalopathic.  SUBJECTIVE:  Awake, alert to person and place but confused as to why she is here.  VITAL SIGNS: BP (!) 177/121   Pulse 94   Resp (!) 22   Ht 5\' 4"  (1.626 m)   Wt 77.6 kg (171 lb)   SpO2 98%   BMI 29.35 kg/m   HEMODYNAMICS:    VENTILATOR SETTINGS:    INTAKE / OUTPUT: No intake/output data recorded.   PHYSICAL EXAMINATION: General: Adult female, appears older than stated age, resting in bed, in NAD. Neuro: Awake but confused, no focal deficits. HEENT: [place]/AT. Sclerae anicteric, EOMI. Cardiovascular: RRR, no M/R/G.  Lungs: Respirations even and unlabored.  CTA bilaterally, No W/R/R Abdomen: BS x 4, soft, NT/ND.  Musculoskeletal: No gross deformities, no edema.  Skin: Intact, warm, no  rashes.    LABS:  BMET Recent Labs  Lab 03/23/18 0801  03/24/18 0520 03/24/18 1652 03/25/18 1818  NA 138  --  137  --  135  K 4.0   < > 3.7 3.7 3.7  CL 109  --  106  --  100  CO2 22  --  24  --  24  BUN 8  --  8  --  7  CREATININE 1.11*  --  1.15*  --  1.00  GLUCOSE 91  --  100*  --  100*   < > = values in this interval not displayed.    Electrolytes Recent Labs  Lab 03/23/18 0801 03/23/18 1641 03/24/18 0520 03/25/18 1818  CALCIUM 8.5*  --  8.3* 9.2  MG  --  1.1*  --   --     CBC Recent Labs  Lab 03/23/18 0801 03/24/18 0520 03/25/18 1818  WBC 5.9 7.6 9.8  HGB 12.2 11.3* 13.6  HCT 36.8 33.9* 39.9  PLT 193 197 225    Coag's No results for input(s): APTT, INR in the last 168 hours.  Sepsis Markers  No results for input(s): LATICACIDVEN, PROCALCITON, O2SATVEN in the last 168 hours.  ABG No results for input(s): PHART, PCO2ART, PO2ART in the last 168 hours.  Liver Enzymes Recent Labs  Lab 03/25/18 1818  AST 80*  ALT 15  ALKPHOS 64  BILITOT 1.0  ALBUMIN 3.9    Cardiac Enzymes Recent Labs  Lab 03/25/18 1818  TROPONINI 11.31*    Glucose Recent Labs  Lab 03/23/18 2117 03/23/18 2119 03/24/18 0618 03/24/18 1150 03/24/18 1613 03/24/18 2133  GLUCAP 129* 97 88 106* 84 106*    Imaging Dg Chest 2 View  Result Date: 03/25/2018 CLINICAL DATA:  62 year old female with a history of dyspnea and headache EXAM: CHEST - 2 VIEW COMPARISON:  03/18/2018 FINDINGS: Cardiomediastinal silhouette unchanged in size and contour with surgical changes of prior CABG. Coarsened interstitial markings bilaterally. Lateral view demonstrates blunting of the costophrenic sulcus. Surgical changes of the right chest again demonstrated. No confluent airspace disease. No acute displaced fracture IMPRESSION: Chronic lung changes, with either scarring or small pleural effusion on the lateral view. No evidence of lobar pneumonia. Surgical changes of prior CABG. Electronically  Signed   By: Gilmer Mor D.O.   On: 03/25/2018 19:45   Ct Head Wo Contrast  Result Date: 03/25/2018 CLINICAL DATA:  Mental status changes. Hypertension. Left-sided deficits from prior stroke. EXAM: CT HEAD WITHOUT CONTRAST TECHNIQUE: Contiguous axial images were obtained from the base of the skull through the vertex without intravenous contrast. COMPARISON:  Multiple exams, including 03/19/2018 FINDINGS: Brain: Right anterior middle cerebral artery distribution encephalomalacia with distribution of involvement similar to 03/19/2018. The brainstem and cerebellum appear unremarkable. Again observed is intraventricular hemorrhage filling most of the left lateral ventricle. There has been some clock contraction/reduction in overall volume, and the temporal horn of the left lateral ventricle is not dilated. Also the blood products in the third ventricle and in the right lateral ventricle appear to have intervally cleared. 6 mm of right-to-left midline shift, formerly 8 mm. Periventricular white matter and corona radiata hypodensities favor chronic ischemic microvascular white matter disease. No new hemorrhage is identified. Vascular: There is atherosclerotic calcification of the cavernous carotid arteries bilaterally. Skull: Unremarkable Sinuses/Orbits: Left mastoidectomy with small right mastoid effusion and fluid in the remaining left mastoid air cells. No middle ear fluid. Other: No supplemental non-categorized findings. IMPRESSION: 1. Resolution of the prior blood products in the right lateral ventricle and third ventricle. Mild reduction in volume of the hyperdense blood products filling most of the left lateral ventricle. Reduction in left-to-right midline shift from previous 8 mm to current 6 mm. 2. Right anterior MCA distribution encephalomalacia compatible with remote stroke. 3. Periventricular white matter and corona radiata hypodensities favor chronic ischemic microvascular white matter disease. 4. Small  right mastoid effusion.  Prior left mastoidectomy. Electronically Signed   By: Gaylyn Rong M.D.   On: 03/25/2018 19:33   STUDIES:  CT head 7/17 > resolution of prior blood products.  No new process.  CULTURES: None.  ANTIBIOTICS: None.  SIGNIFICANT EVENTS: 7/10 through 7/16 > admit. 7/17 > re-admit.  LINES/TUBES: None.  DISCUSSION: 62 y.o. female admitted 7/10 through 7/16 with IVH due to HTN and xarelto. Discharged 7/16 to SNF.  Returned 7/17 with hypertensive urgency and started on cleviprex; therefore, required ICU admission.  ASSESSMENT / PLAN:  PULMONARY A: Tobacco dependence. Hx pulmonary nodules, emphysema. P:   Tobacco cessation counseling. Nicotine patch. Albuterol PRN.  CARDIOVASCULAR A:  HTN Urgency - presenting BP 177/121. Troponin bump - ?  NSTEMI. Hx a.fib with RVR (anticoatulation stopped last admit due to IVH), HTN, HLD, CAD. P:  Trend troponin to ensure accuracy. No anticoagulation / antiplatelets given recent IVH. Start low dose BB BID. Will repeat echo given new troponin bump. Day team to please consult cardiology. Continue preadmission ASA, atorvastatin, diltiazem, lisinopril.  RENAL A:   No acute issues. P:   BMP in AM.  GASTROINTESTINAL A:   Hx GERD. Nutrition. P:   Continue preadmission H2 blocker. NPO for now.  HEMATOLOGIC A:   VTE Prophylaxis. P:  SCD's only. CBC in AM.  INFECTIOUS A:   No indication of infection. P:   Monitor clinically.  ENDOCRINE A: Hx hypothyroidism. P: Continue preadmission synthroid.  NEUROLOGIC A:   Headache - repeat CT negative for acute process. Recent IVH - felt to be due to HTN and xarelto use. Hx depression. P:   Continue preadmission fioricet, bupropion, topiramate, escitalopram. F/u with neurology as outpatient for repeat CT head in 3 - 4 weeks. PT consult in AM.   Family updated: None available.  Interdisciplinary Family Meeting v Palliative Care Meeting:  Due by:  03/31/18.  CC time: 35 min.   Rutherford Guys, [place] [facility] Pager: [phone]  or [phone] 03/25/2018, 9:37 PM

## 2018-03-25 NOTE — ED Provider Notes (Signed)
[facility] EMERGENCY DEPARTMENT Provider Note   CSN: [id number] Arrival date & time: 03/25/18  1745     History   Chief Complaint Chief Complaint  Patient presents with  . Headache    HPI [name] is a 62 y.o. female with a history of CAD status post PCI in 2012 in 2016, chronic diastolic heart failure, hypothyroidism, CVA with left-side residual deficits, HTN, A. Fib not on anticoagulation who presents to the emergency department from [facility] with a chief complaint of headache, right-sided weakness, and general malaise.  EMS noted the patient's blood pressure to be 190/80. In the ED, she states "I  do not know what is wrong with me, I just feel terrible."  She states that she feels weak and is noted to be leaning to her right side.  She reports associated nausea, but no vomiting.  She endorses dyspnea, but denies chest pain, palpitations, or lower extremity swelling.  She has an allover headache.  No known aggravating or alleviating factors.  She appears unwell.  She was discharged yesterday after an admission on 7/10 for IVH secondary to HTN and Xarelto coagulopathy, A. fib with RVR, and hypertensive emergency.  Xarelto was discontinued.  The patient was started on a low-dose aspirin.  Patient is a poor historian and only orient to self.   Level 5 caveat secondary to altered mental status.  The history is provided by the patient. No language interpreter was used.  Headache   Associated symptoms include shortness of breath and nausea.    Past Medical History:  Diagnosis Date  . Acute right MCA stroke (HCC) 11/07/10  . Atrial fibrillation (HCC)    a. s/p TEE-DCCV 02/2104; b. Xarelto started  . CAD (coronary artery disease)    a.  cath 09/2010: LAD stent patent, S-Int/dCFX ok, S-PDA ok, L-LAD atretic;  b. Lexiscan Myoview (02/2014):  no ischemia, EF 55%; c. 11/2014 Cath/PCI: LM nl, LAD 20pISR, LCX 80-53m, OM1 nl, RI 70p, RCA 40-20m, RPDA  95ost/95-56m (PTCA only w/ reduction to 50p/6m), 60d, L->LAD atretic, VG->RI->OM nl, VG->PDA 100p.  . Cardiomyopathy with EF 40% at TEE 02/17/14 (likely tachycardia mediated - Myoview 02/19/14 neg for ischemia with normal EF) 02/18/2014  . COPD (chronic obstructive pulmonary disease) (HCC)   . Depression   . Eczema   . GERD (gastroesophageal reflux disease)   . Headache   . HLD (hyperlipidemia)   . Homelessness 11/12/2011  . 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 Hx of CABG   . Hx of transesophageal echocardiography (TEE) for monitoring 11/2010   TEE 11/2010: EF 60-65%, BAE, trivial atrial septal shunt;  right heart cath in 10/2010 with elevated R and L heart pressures and diuretic started  . Hypertension   . Hypothyroidism   . Persistent atrial fibrillation (HCC) 10/2014  . Pulmonary nodules    repeat CT due in 11/2011  . Sleep apnea    recent sleep study  in 04/2014 per chart review  shows no significant OSA    Patient Active Problem List   Diagnosis Date Noted  . Hypertensive emergency 03/25/2018  . NSTEMI (non-ST elevated myocardial infarction) (HCC)   . Troponin level elevated   . IVH (intraventricular hemorrhage) (HCC) 03/19/2018  . 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 Long term current use of amiodarone 03/27/2017  . Severe recurrent  major depression without psychotic features (HCC) 11/19/2016    Class: Chronic  . Pre-procedure lab exam 09/13/2016  . Generalized headaches 07/04/2016  . Protein-calorie malnutrition, severe (HCC) 04/03/2015  . Chronic diastolic CHF (congestive heart failure), NYHA class 2 (HCC) 01/24/2015  . Demand ischemia secondary to AF with RVR 12/13/2014  . CKD (chronic kidney disease), stage III (HCC) 11/11/2014  . Hypokalemia 05/22/2014  . Mood disorder (HCC) 03/20/2014  . HTN (hypertension) 03/20/2014  . Anemia, unspecified 03/20/2014  . Smoker 03/20/2014  . Atrial  fibrillation with RVR (HCC) 03/03/2014  . Hypotension 03/03/2014  . Pulmonary hypertension (HCC) 03/03/2014  . COPD (chronic obstructive pulmonary disease) (HCC) 03/03/2014  . H/O: CVA (cerebrovascular accident)   . Long-term (current) use of anticoagulants   . Cardiomyopathy-h/o tachycardia mediated-EF 65% per echo March 2016 02/18/2014  . Hyperlipidemia 02/18/2014  . CAD '07, LAD PCI 2012, SVG-PDA PTCA 11/10/14 02/16/2014  . Chronic diastolic heart failure (HCC)   . Pulmonary nodules   . Hypothyroidism   . OSA- C-pap intol     Past Surgical History:  Procedure Laterality Date  . bilateral knee surgery    . BLADDER SURGERY    . CARDIOVERSION N/A 02/17/2014   Procedure: CARDIOVERSION;  Surgeon: Pricilla Riffle, MD;  Location: [facility] ENDOSCOPY;  Service: Cardiovascular;  Laterality: N/A;  . CARDIOVERSION N/A 09/20/2016   Procedure: CARDIOVERSION;  Surgeon: Lars Masson, MD;  Location: [facility] ENDOSCOPY;  Service: Cardiovascular;  Laterality: N/A;  . cath 2012    . CHEST WALL RECONSTRUCTION    . CORONARY ARTERY BYPASS GRAFT    . debriment for infection in chest    . HERNIA REPAIR    . LEFT AND RIGHT HEART CATHETERIZATION WITH CORONARY ANGIOGRAM N/A 11/10/2014   Procedure: LEFT AND RIGHT HEART CATHETERIZATION WITH CORONARY ANGIOGRAM;  Surgeon: Marykay Lex, MD;  Location: [facility] CATH LAB;  Service: Cardiovascular;  Laterality: N/A;  . Left mastoidectomy    . TEE WITHOUT CARDIOVERSION N/A 02/17/2014   Procedure: TRANSESOPHAGEAL ECHOCARDIOGRAM (TEE);  Surgeon: Pricilla Riffle, MD;  Location: [facility] ENDOSCOPY;  Service: Cardiovascular;  Laterality: N/A;     OB History   None      Home Medications    Prior to Admission medications   Medication Sig Start Date End Date Taking? Authorizing Provider  aspirin EC 81 MG EC tablet Take 1 tablet (81 mg total) by mouth daily. 03/25/18   Layne Benton, NP  atorvastatin (LIPITOR) 40 MG tablet Take 1 tablet (40 mg total) by mouth daily at 6 PM. 03/24/18   Layne Benton, NP  buPROPion (WELLBUTRIN XL) 300 MG 24 hr tablet Take 1 tablet (300 mg total) by mouth every morning. 10/29/17   Plovsky, Earvin Hansen, MD  butalbital-acetaminophen-caffeine (FIORICET, ESGIC) [phone] MG tablet Take 1 tablet by mouth every 8 (eight) hours as needed for headache. 03/24/18   Layne Benton, NP  diltiazem (CARDIZEM SR) 120 MG 12 hr capsule Take 1 capsule (120 mg total) by mouth 3 (three) times daily. 03/24/18   Layne Benton, NP  escitalopram (LEXAPRO) 10 MG tablet Take 1 tablet (10 mg total) by mouth daily. 10/29/17 10/29/18  Plovsky, Earvin Hansen, MD  fluticasone (FLONASE) 50 MCG/ACT nasal spray Place 2 sprays into both nostrils daily. 08/15/16   Panosh, Neta Mends, MD  levothyroxine (SYNTHROID, LEVOTHROID) 200 MCG tablet Take 1 tablet (200 mcg total) by mouth daily before breakfast. 03/25/18   Layne Benton, NP  lisinopril (PRINIVIL,ZESTRIL) 20 MG tablet Take  1 tablet (20 mg total) by mouth 2 (two) times daily. 03/24/18   Layne Benton, NP  magnesium oxide (MAG-OX) 400 MG tablet Take 1 tablet (400 mg total) by mouth daily. 09/17/16   Lars Masson, MD  PROAIR HFA 108 [phone] Base) MCG/ACT inhaler INHALE 2 PUFFS BY MOUTH EVERY 6 HOURS AS NEEDED FOR SHORTNESS OF BREATH 02/03/18   Leslye Peer, MD  ranitidine (ZANTAC) 150 MG tablet Take 1 tablet (150 mg total) by mouth 2 (two) times daily. Patient taking differently: Take 150 mg by mouth 2 (two) times daily as needed for heartburn.  04/16/17   Panosh, Neta Mends, MD  topiramate (TOPAMAX) 50 MG tablet Take 1 tablet (50 mg total) by mouth 2 (two) times daily. 10/13/17   Micki Riley, MD  vitamin B-12 1000 MCG tablet Take 1 tablet (1,000 mcg total) by mouth daily. 03/25/18   Layne Benton, NP    Family History Family History  Problem Relation Age of Onset  . COPD Mother   . Heart disease Mother   . Arthritis Mother        Rheumatoid and PMR  . Osteoporosis Mother        Mom fractured hip  . Diabetes type II Mother   . Depression Mother   .  Heart attack Father   . Depression Father   . Hypertension Father   . Alcohol abuse Father   . Depression Sister   . Anxiety disorder Sister   . Drug abuse Sister   . Stroke Neg Hx     Social History Social History   Tobacco Use  . Smoking status: Current Every Day Smoker    Packs/day: 0.50    Years: 30.00    Pack years: 15.00    Types: Cigarettes    Last attempt to quit: 09/10/2014    Years since quitting: 3.5  . Smokeless tobacco: Never Used  Substance Use Topics  . Alcohol use: No    Alcohol/week: 0.0 oz  . Drug use: No     Allergies   Avelox [moxifloxacin hcl in nacl]; Pamelor [nortriptyline hcl]; Amoxicillin; Hydrocodone; Oxycodone; Penicillins; and Sulfa antibiotics   Review of Systems Review of Systems  Unable to perform ROS: Mental status change  Respiratory: Positive for shortness of breath.   Gastrointestinal: Positive for nausea.  Neurological: Positive for weakness and headaches.   Physical Exam Updated Vital Signs BP 132/87   Pulse 96   Resp (!) 23   Ht 5\' 4"  (1.626 m)   Wt 77.6 kg (171 lb)   SpO2 (!) 75%   BMI 29.35 kg/m   Physical Exam  Constitutional: She appears distressed.  HENT:  Head: Normocephalic.  Eyes: Pupils are equal, round, and reactive to light. Conjunctivae and EOM are normal.  Neck: Neck supple.  Cardiovascular: Normal rate. An irregularly irregular rhythm present. Exam reveals no gallop and no friction rub.  No murmur heard. Pulmonary/Chest: Effort normal. No stridor. No respiratory distress.  Increased work of breathing.  Crackles in the bilateral bases.  Abdominal: Soft. She exhibits no distension. There is no tenderness. There is no guarding.  Neurological: She is alert. GCS eye subscore is 4. GCS verbal subscore is 5. GCS motor subscore is 6.  No appreciable weakness of the right side; however, exam is limited as the patient has residual left-sided deficits from a previous CVA.  Cranial nerves II through XII are grossly  intact.  She is able to pull herself from laying  flat to a sitting position.  Oriented to self.  Skin: Skin is warm. No rash noted. She is not diaphoretic.  Psychiatric: Her behavior is normal.  Nursing note and vitals reviewed.  ED Treatments / Results  Labs (all labs ordered are listed, but only abnormal results are displayed) Labs Reviewed  TROPONIN I - Abnormal; Notable for the following components:      Result Value   Troponin I 11.31 (*)    All other components within normal limits  COMPREHENSIVE METABOLIC PANEL - Abnormal; Notable for the following components:   Glucose, Bld 100 (*)    AST 80 (*)    GFR calc non Af Amer 60 (*)    All other components within normal limits  BRAIN NATRIURETIC PEPTIDE - Abnormal; Notable for the following components:   B Natriuretic Peptide 608.8 (*)    All other components within normal limits  I-STAT VENOUS BLOOD GAS, ED - Abnormal; Notable for the following components:   pCO2, Ven 43.8 (*)    pO2, Ven 23.0 (*)    Acid-base deficit 4.0 (*)    All other components within normal limits  CBC  URINALYSIS, ROUTINE W REFLEX MICROSCOPIC  CBC  BASIC METABOLIC PANEL  MAGNESIUM  PHOSPHORUS  TROPONIN I  TROPONIN I    EKG EKG Interpretation  Date/Time:  Wednesday March 25 2018 18:06:13 EDT Ventricular Rate:  100 PR Interval:    QRS Duration: 117 QT Interval:  329 QTC Calculation: 425 R Axis:   14 Text Interpretation:  Atrial fibrillation Probable LVH with secondary repol abnrm Baseline wander in lead(s) III aVF Since last EKG, rate has improved Otherwise no significant change Confirmed by Shaune Pollack [phone]) on 03/25/2018 7:03:06 PM   Radiology Dg Chest 2 View  Result Date: 03/25/2018 CLINICAL DATA:  62 year old female with a history of dyspnea and headache EXAM: CHEST - 2 VIEW COMPARISON:  03/18/2018 FINDINGS: Cardiomediastinal silhouette unchanged in size and contour with surgical changes of prior CABG. Coarsened interstitial markings  bilaterally. Lateral view demonstrates blunting of the costophrenic sulcus. Surgical changes of the right chest again demonstrated. No confluent airspace disease. No acute displaced fracture IMPRESSION: Chronic lung changes, with either scarring or small pleural effusion on the lateral view. No evidence of lobar pneumonia. Surgical changes of prior CABG. Electronically Signed   By: Gilmer Mor D.O.   On: 03/25/2018 19:45   Ct Head Wo Contrast  Result Date: 03/25/2018 CLINICAL DATA:  Mental status changes. Hypertension. Left-sided deficits from prior stroke. EXAM: CT HEAD WITHOUT CONTRAST TECHNIQUE: Contiguous axial images were obtained from the base of the skull through the vertex without intravenous contrast. COMPARISON:  Multiple exams, including 03/19/2018 FINDINGS: Brain: Right anterior middle cerebral artery distribution encephalomalacia with distribution of involvement similar to 03/19/2018. The brainstem and cerebellum appear unremarkable. Again observed is intraventricular hemorrhage filling most of the left lateral ventricle. There has been some clock contraction/reduction in overall volume, and the temporal horn of the left lateral ventricle is not dilated. Also the blood products in the third ventricle and in the right lateral ventricle appear to have intervally cleared. 6 mm of right-to-left midline shift, formerly 8 mm. Periventricular white matter and corona radiata hypodensities favor chronic ischemic microvascular white matter disease. No new hemorrhage is identified. Vascular: There is atherosclerotic calcification of the cavernous carotid arteries bilaterally. Skull: Unremarkable Sinuses/Orbits: Left mastoidectomy with small right mastoid effusion and fluid in the remaining left mastoid air cells. No middle ear fluid. Other: No supplemental non-categorized findings.  IMPRESSION: 1. Resolution of the prior blood products in the right lateral ventricle and third ventricle. Mild reduction in  volume of the hyperdense blood products filling most of the left lateral ventricle. Reduction in left-to-right midline shift from previous 8 mm to current 6 mm. 2. Right anterior MCA distribution encephalomalacia compatible with remote stroke. 3. Periventricular white matter and corona radiata hypodensities favor chronic ischemic microvascular white matter disease. 4. Small right mastoid effusion.  Prior left mastoidectomy. Electronically Signed   By: Gaylyn Rong M.D.   On: 03/25/2018 19:33    Procedures .Critical Care Performed by: Barkley Boards, PA-C Authorized by: Barkley Boards, PA-C   Critical care provider statement:    Critical care time (minutes):  35   Critical care time was exclusive of:  Separately billable procedures and treating other patients and teaching time   Critical care was necessary to treat or prevent imminent or life-threatening deterioration of the following conditions:  Circulatory failure   Critical care was time spent personally by me on the following activities:  Obtaining history from patient or surrogate, examination of patient, evaluation of patient's response to treatment, ordering and performing treatments and interventions, ordering and review of laboratory studies, ordering and review of radiographic studies, pulse oximetry and re-evaluation of patient's condition   I assumed direction of critical care for this patient from another provider in my specialty: no     (including critical care time)  Medications Ordered in ED Medications  clevidipine (CLEVIPREX) infusion 0.5 mg/mL (3 mg/hr Intravenous Rate/Dose Change 03/25/18 2136)  0.9 %  sodium chloride infusion (250 mLs Intravenous New Bag/Given 03/25/18 2137)  atorvastatin (LIPITOR) tablet 40 mg (has no administration in time range)  diltiazem (CARDIZEM SR) 12 hr capsule 120 mg (has no administration in time range)  levothyroxine (SYNTHROID, LEVOTHROID) tablet 200 mcg (has no administration in time  range)  lisinopril (PRINIVIL,ZESTRIL) tablet 20 mg (has no administration in time range)  famotidine (PEPCID) tablet 20 mg (has no administration in time range)  topiramate (TOPAMAX) tablet 50 mg (has no administration in time range)  vitamin B-12 (CYANOCOBALAMIN) tablet 1,000 mcg (has no administration in time range)  magnesium oxide (MAG-OX) tablet 400 mg (has no administration in time range)  butalbital-acetaminophen-caffeine (FIORICET, ESGIC) 50-325-40 MG per tablet 1 tablet (has no administration in time range)  buPROPion (WELLBUTRIN XL) 24 hr tablet 300 mg (has no administration in time range)  aspirin EC tablet 81 mg (has no administration in time range)  escitalopram (LEXAPRO) tablet 10 mg (has no administration in time range)  fluticasone (FLONASE) 50 MCG/ACT nasal spray 2 spray (has no administration in time range)  metoprolol tartrate (LOPRESSOR) tablet 12.5 mg (has no administration in time range)  nicotine (NICODERM CQ - dosed in mg/24 hr) patch 7 mg (has no administration in time range)  ondansetron (ZOFRAN) injection 4 mg (4 mg Intravenous Given 03/25/18 1902)  labetalol (NORMODYNE,TRANDATE) injection 10 mg (10 mg Intravenous Given 03/25/18 1907)  labetalol (NORMODYNE,TRANDATE) injection 20 mg (20 mg Intravenous Given 03/25/18 2010)     Initial Impression / Assessment and Plan / ED Course  I have reviewed the triage vital signs and the nursing notes.  Pertinent labs & imaging results that were available during my care of the patient were reviewed by me and considered in my medical decision making (see chart for details).     62 year old female with a history of CAD status post PCI in 2012 in 2016, chronic diastolic heart failure, hypothyroidism, CVA  with left-side residual deficits, HTN, A. Fib not on anticoagulation presenting with headache, right-sided weakness, nausea, and dyspnea.  She was discharged yesterday after a weeklong stay for hypertensive emergency and intracranial  hemorrhage secondary to HTN and Xarelto.  Xarelto was discontinued during her admission and she was started on a low-dose of aspirin.  On arrival EMS notes BP of 190/80 in route.  She is 184/120 in the ED.  She appears distressed and is noted to have heavy breathing.  The patient was seen and evaluated along with Dr. Erma Heritage, attending physician.  EKG with A. fib and improved rate since previous on July 7, but no other changes.   Given recent hospitalization and hypertension, there is concern for acute CVA and pulmonary flash edema as the patient is dyspneic.  Head CT is unchanged from previous with improving reduction in the left to right midline shift.  Chest x-ray is negative for effusion or pneumonia.    Troponin elevated to 11.31.  Suspect this is secondary to hypertension as there are no changes on EKG.  The patient was given 2 doses of IV labetalol with no improvement in her blood pressure.  Plan to initiate the patient on a nicardipine drip, but was notified by pharmacy that we are out of the medication.  Cleviprex infusion ordered improvement of her blood pressure into the 150s systolically.  Labs are otherwise notable for: VBG with pH of 7.3.  BNP 608.  AST is acutely elevated to 80, no history of similar.  ALT is normal.  Spoke with PA Celine Mans with critical care who will accept the patient for admission. The patient appears reasonably stabilized for admission considering the current resources, flow, and capabilities available in the ED at this time, and I doubt any other [facility] requiring further screening and/or treatment in the ED prior to admission.   Final Clinical Impressions(s) / ED Diagnoses   Final diagnoses:  Hypertensive emergency    ED Discharge Orders    None       Denelda Akerley A, PA-C 03/25/18 2208    Shaune Pollack, MD 03/26/18 1025

## 2018-03-25 NOTE — ED Triage Notes (Signed)
Pt here by [facility] from [facility] c/o high blood pressure 160/70, per EMS 190/80 and h/a with weakness x 2 days. . Pt is on 2L of oxygen at home.  Pt has left sided deficits from a previous stroke.  VS per MES 190/80, P 87, RR 18, SPO2 98%.

## 2018-03-25 NOTE — ED Notes (Signed)
Blood collected; blue, light green, and purple tops sent to lab.

## 2018-03-26 ENCOUNTER — Ambulatory Visit (HOSPITAL_COMMUNITY): Payer: PPO

## 2018-03-26 DIAGNOSIS — R9431 Abnormal electrocardiogram [ECG] [EKG]: Secondary | ICD-10-CM

## 2018-03-26 LAB — CBC
HCT: 38.3 % (ref 36.0–46.0)
Hemoglobin: 13.1 g/dL (ref 12.0–15.0)
MCH: 33.9 pg (ref 26.0–34.0)
MCHC: 34.2 g/dL (ref 30.0–36.0)
MCV: 99 fL (ref 78.0–100.0)
Platelets: 211 10*3/uL (ref 150–400)
RBC: 3.87 MIL/uL (ref 3.87–5.11)
RDW: 12.6 % (ref 11.5–15.5)
WBC: 11 10*3/uL — ABNORMAL HIGH (ref 4.0–10.5)

## 2018-03-26 LAB — MAGNESIUM: Magnesium: 1.1 mg/dL — ABNORMAL LOW (ref 1.7–2.4)

## 2018-03-26 LAB — BASIC METABOLIC PANEL
Anion gap: 13 (ref 5–15)
BUN: 7 mg/dL (ref 6–20)
CALCIUM: 8.9 mg/dL (ref 8.9–10.3)
CO2: 22 mmol/L (ref 22–32)
Chloride: 96 mmol/L — ABNORMAL LOW (ref 98–111)
Creatinine, Ser: 0.91 mg/dL (ref 0.44–1.00)
GFR calc Af Amer: >60 mL/min (ref >60)
GFR calc non Af Amer: >60 mL/min (ref >60)
GLUCOSE: 115 mg/dL — AB (ref 70–99)
Potassium: 3.3 mmol/L — ABNORMAL LOW (ref 3.5–5.1)
Sodium: 131 mmol/L — ABNORMAL LOW (ref 135–145)

## 2018-03-26 LAB — TROPONIN I
Troponin I: 9.9 ng/mL (ref <0.03)
Troponin I: 12.11 ng/mL (ref <0.03)
Troponin I: 12.4 ng/mL (ref <0.03)

## 2018-03-26 LAB — ECHOCARDIOGRAM COMPLETE
Height: 64 in
WEIGHTICAEL: 2673.74 [oz_av]

## 2018-03-26 LAB — GLUCOSE, CAPILLARY: Glucose-Capillary: 111 mg/dL — ABNORMAL HIGH (ref 70–99)

## 2018-03-26 LAB — PHOSPHORUS: Phosphorus: 3.8 mg/dL (ref 2.5–4.6)

## 2018-03-26 MED ORDER — HYDRALAZINE HCL 20 MG/ML IJ SOLN
10.0000 mg | INTRAMUSCULAR | Status: DC | PRN
  Administered 2018-03-26 – 2018-03-27 (×2): 20 mg via INTRAVENOUS
  Filled 2018-03-26 (×2): qty 1

## 2018-03-26 MED ORDER — POTASSIUM CHLORIDE CRYS ER 20 MEQ PO TBCR
40.0000 meq | EXTENDED_RELEASE_TABLET | Freq: Once | ORAL | Status: AC
  Administered 2018-03-26: 40 meq via ORAL
  Filled 2018-03-26: qty 2

## 2018-03-26 MED ORDER — METOPROLOL TARTRATE 25 MG PO TABS
25.0000 mg | ORAL_TABLET | Freq: Two times a day (BID) | ORAL | Status: DC
  Administered 2018-03-26 – 2018-04-01 (×11): 25 mg via ORAL
  Filled 2018-03-26 (×11): qty 1

## 2018-03-26 MED ORDER — AMIODARONE HCL IN DEXTROSE 360-4.14 MG/200ML-% IV SOLN
30.0000 mg/h | INTRAVENOUS | Status: DC
  Administered 2018-03-27: 30 mg/h via INTRAVENOUS

## 2018-03-26 MED ORDER — METOPROLOL TARTRATE 25 MG PO TABS
ORAL_TABLET | ORAL | Status: AC
  Filled 2018-03-26: qty 1

## 2018-03-26 MED ORDER — AMIODARONE HCL IN DEXTROSE 360-4.14 MG/200ML-% IV SOLN
60.0000 mg/h | INTRAVENOUS | Status: DC
  Administered 2018-03-26 – 2018-03-27 (×2): 60 mg/h via INTRAVENOUS
  Filled 2018-03-26 (×2): qty 200

## 2018-03-26 MED ORDER — AMIODARONE LOAD VIA INFUSION
150.0000 mg | Freq: Once | INTRAVENOUS | Status: AC
  Administered 2018-03-26: 150 mg via INTRAVENOUS
  Filled 2018-03-26: qty 83.34

## 2018-03-26 NOTE — Progress Notes (Signed)
  Amiodarone Drug - Drug Interaction Consult Note  Recommendations: 1. Monitor for signs or symptoms of myopathy with atorvastatin + amiodarone. 2. Monitor for bradycardia, AV block or myocardial depression with combination of metoprolol + diltiazem + amiodarone.   Amiodarone is metabolized by the cytochrome P450 system and therefore has the potential to cause many drug interactions. Amiodarone has an average plasma half-life of 50 days (range 20 to 100 days).   There is potential for drug interactions to occur several weeks or months after stopping treatment and the onset of drug interactions may be slow after initiating amiodarone.   [x]  Statins: Increased risk of myopathy. Simvastatin- restrict dose to 20mg  daily. Other statins: counsel patients to report any muscle pain or weakness immediately.  []  Anticoagulants: Amiodarone can increase anticoagulant effect. Consider warfarin dose reduction. Patients should be monitored closely and the dose of anticoagulant altered accordingly, remembering that amiodarone levels take several weeks to stabilize.  []  Antiepileptics: Amiodarone can increase plasma concentration of phenytoin, the dose should be reduced. Note that small changes in phenytoin dose can result in large changes in levels. Monitor patient and counsel on signs of toxicity.  [x]  Beta blockers: increased risk of bradycardia, AV block and myocardial depression. Sotalol - avoid concomitant use.  [x]   Calcium channel blockers (diltiazem and verapamil): increased risk of bradycardia, AV block and myocardial depression.  []   Cyclosporine: Amiodarone increases levels of cyclosporine. Reduced dose of cyclosporine is recommended.  []  Digoxin dose should be halved when amiodarone is started.  []  Diuretics: increased risk of cardiotoxicity if hypokalemia occurs.  []  Oral hypoglycemic agents (glyburide, glipizide, glimepiride): increased risk of hypoglycemia. Patient's glucose levels should  be monitored closely when initiating amiodarone therapy.   []  Drugs that prolong the QT interval:  Torsades de pointes risk may be increased with concurrent use - avoid if possible.  Monitor QTc, also keep magnesium/potassium WNL if concurrent therapy can't be avoided. [name] Antibiotics: e.g. fluoroquinolones, erythromycin. . Antiarrhythmics: e.g. quinidine, procainamide, disopyramide, sotalol. . Antipsychotics: e.g. phenothiazines, haloperidol.  . Lithium, tricyclic antidepressants, and methadone. Thank You,  Fayne Norrie  03/26/2018 8:12 PM

## 2018-03-26 NOTE — Progress Notes (Signed)
PT Cancellation Note  Patient Details Name: [name] MRN: [id number] DOB: 07-11-1956   Cancelled Treatment:    Reason Eval/Treat Not Completed: Other (comment). Elevated troponin (12.15 to 9.90), will await additional value of downtrend to follow-up with PT evaluation.  Ina Homes, PT, DPT Acute Rehab Services  Pager: [phone]  Malachy Chamber 03/26/2018, 11:46 AM

## 2018-03-26 NOTE — Progress Notes (Signed)
Joneen Roach NP notified re: b/p he will come to bedside to reevaluate patient.

## 2018-03-26 NOTE — Evaluation (Signed)
Physical Therapy Evaluation Patient Details Name: [name] MRN: [id number] DOB: 10/25/1955 Today's Date: 03/26/2018   History of Present Illness  Pt is a 62 y.o. female admitted from SNF on 03/25/18 with hypertensive urgency and fluid overload. Of note, recent admit for R MCA infarct with IVH on 03/19/18 with d/c to SNF on 03/24/18. PMH includes CAD, HF, HTN, a-fib (not on anticoagulation), R MCA infarct (2012).    Clinical Impression  Pt presents with an overall decrease in functional mobility secondary to above. Prior to initial admission, pt indep and lives with roommates. Recent admission 7/11-16 with d/c to SNF less than 24 hrs before readmission due to HTN. Today, pt continues to demonstrate delayed cognition, including decreased attention and slowed processing. Able to take steps to recliner with HHA and minA to maintain balance, but requires repeated multimodal cues for sequencing. Demonstrates decreased gross and fine motor skills in BUEs. Feel pt would benefit from intensive CIR-level therapies pending availability of support system once discharged. Will follow acutely to address established goals.  Post-amb BP 133/100 (RN notified)    Follow Up Recommendations CIR;Supervision/Assistance - 24 hour    Equipment Recommendations  (TBD)    Recommendations for Other Services Rehab consult;OT consult     Precautions / Restrictions Precautions Precautions: Fall Restrictions Weight Bearing Restrictions: No      Mobility  Bed Mobility Overal bed mobility: Needs Assistance Bed Mobility: Supine to Sit     Supine to sit: Min assist     General bed mobility comments: MinA for HHA to assist trunk elevation; increased time and effort, requiring cues to attend to task  Transfers Overall transfer level: Needs assistance Equipment used: 1 person hand held assist Transfers: Sit to/from Stand Sit to Stand: Mod assist         General transfer comment: ModA for trunk  elevation  Ambulation/Gait Ambulation/Gait assistance: Min assist Gait Distance (Feet): 5 Feet Assistive device: 1 person hand held assist Gait Pattern/deviations: Step-to pattern Gait velocity: Decreased   General Gait Details: Intermittent minA to maintain balance ambulating to chair with HHA. Slow, delayed movement requiring cues to completely clear L foot from floor as pt initially shuffling. C/o increased HA and nausea   Stairs            Wheelchair Mobility    Modified Rankin (Stroke Patients Only)       Balance Overall balance assessment: Needs assistance Sitting-balance support: No upper extremity supported;Feet supported Sitting balance-Leahy Scale: Fair Sitting balance - Comments: Posterior LOB when attempting to don socks sitting EOB   Standing balance support: Bilateral upper extremity supported Standing balance-Leahy Scale: Poor Standing balance comment: UE support for balance                             Pertinent Vitals/Pain Pain Assessment: Faces Faces Pain Scale: Hurts little more Pain Location: headache Pain Descriptors / Indicators: Headache Pain Intervention(s): Limited activity within patient's tolerance;Monitored during session    Home Living Family/patient expects to be discharged to:: Skilled nursing facility                      Prior Function Level of Independence: Needs assistance   Gait / Transfers Assistance Needed: Poor historian. Per chart was amb 56' with RW and modA due to L-side hemiparesis with acute PT on 03/24/18; likely did not have enough time to begin with PT/OT at SNF due to quick  readmission           Hand Dominance   Dominant Hand: Right    Extremity/Trunk Assessment   Upper Extremity Assessment Upper Extremity Assessment: Generalized weakness;LUE deficits/detail LUE Deficits / Details: Per chart, baseline weakness since CVA in 2012    Lower Extremity Assessment Lower Extremity Assessment:  Generalized weakness;LLE deficits/detail RLE Deficits / Details: Grossly 4/5 LLE Deficits / Details: Grossly 3/5       Communication      Cognition Arousal/Alertness: Awake/alert Behavior During Therapy: Flat affect Overall Cognitive Status: No family/caregiver present to determine baseline cognitive functioning Area of Impairment: Orientation;Attention;Memory;Following commands;Safety/judgement;Awareness;Problem solving                 Orientation Level: Disoriented to;Time;Situation Current Attention Level: Sustained Memory: Decreased short-term memory Following Commands: Follows one step commands with increased time;Follows multi-step commands inconsistently Safety/Judgement: Decreased awareness of safety;Decreased awareness of deficits Awareness: Intellectual Problem Solving: Slow processing;Decreased initiation;Requires verbal cues General Comments: Pt appears very slow to process with multiple blank stares throughout session, required repeating of same questions a couple times. Reports "I don't know" to many questions      General Comments      Exercises     Assessment/Plan    PT Assessment Patient needs continued PT services  PT Problem List Decreased strength;Decreased mobility;Decreased cognition;Decreased balance;Decreased knowledge of precautions;Decreased safety awareness;Decreased activity tolerance;Cardiopulmonary status limiting activity;Decreased knowledge of use of DME       PT Treatment Interventions DME instruction;Therapeutic activities;Gait training;Therapeutic exercise;Patient/family education;Balance training;Functional mobility training    PT Goals (Current goals can be found in the Care Plan section)  Acute Rehab PT Goals Patient Stated Goal: Get rid of headache PT Goal Formulation: With patient Time For Goal Achievement: 04/16/18 Potential to Achieve Goals: Good    Frequency Min 3X/week   Barriers to discharge        Co-evaluation                AM-PAC PT "6 Clicks" Daily Activity  Outcome Measure Difficulty turning over in bed (including adjusting bedclothes, sheets and blankets)?: None Difficulty moving from lying on back to sitting on the side of the bed? : Unable Difficulty sitting down on and standing up from a chair with arms (e.g., wheelchair, bedside commode, etc,.)?: Unable Help needed moving to and from a bed to chair (including a wheelchair)?: A Little Help needed walking in hospital room?: A Little Help needed climbing 3-5 steps with a railing? : A Lot 6 Click Score: 14    End of Session Equipment Utilized During Treatment: Gait belt Activity Tolerance: Patient limited by fatigue Patient left: in chair;with chair alarm set;with call bell/phone within reach Nurse Communication: Mobility status PT Visit Diagnosis: Other abnormalities of gait and mobility (R26.89);Other symptoms and signs involving the nervous system (R29.898);Muscle weakness (generalized) (M62.81)    Time: 0383-3383 PT Time Calculation (min) (ACUTE ONLY): 30 min   Charges:   PT Evaluation $PT Eval Moderate Complexity: 1 Mod PT Treatments $Therapeutic Activity: 8-22 mins   PT G Codes:       Ina Homes, PT, DPT Acute Rehab Services  Pager: [phone]  Malachy Chamber 03/26/2018, 2:24 PM

## 2018-03-26 NOTE — Progress Notes (Signed)
Patient restless states she doesn't feel well denies chest pain or headache at this time. O2 applied due to increased respirations. Will notify MD

## 2018-03-26 NOTE — Progress Notes (Signed)
PULMONARY / CRITICAL CARE MEDICINE   Name: [name] MRN: [id number] DOB: [birth year]    ADMISSION DATE:  03/25/2018 CONSULTATION DATE:  03/25/18  REFERRING MD:  Erma Heritage  CHIEF COMPLAINT:  HTN Urgency  HISTORY OF PRESENT ILLNESS:[name] is a 62 y.o. female with PMH as outlined below. She had recent admission 03/18/18 through 03/24/18 for IVH felt to be due to HTN and xarelto use.  During that admission, xarelto was reversed and plavix was stopped.  Once hemorrhage was stable, low dose aspirin was resumed prior to discharge.  She was discharged 7/16 to SNF with plan to repeat CT head in 3 - 4 weeks and if IVH had resolved, then to start eliquis and stop aspirin.  Neurosurgery was also consulted but pt was felt to not be a surgical candidate.  On 7/17 while at SNF, pt had hypertension with SBP in 170s and up to 190s.  She was subsequently send to ED for further workup. In ED, she complained of headache.  CT head was negative for new process. She was started on cleviprex and PCCM was subsequently asked to admit to ICU. She denies any diplopia, blurry vision, pre-syncope, syncope, chest pain, dyspnea, N/V/D, abd pain.  SUBJECTIVE:  BP better this AM. Still on cleviprex drip.   VITAL SIGNS: BP 140/81   Pulse 84   Temp (!) 97.4 F (36.3 C) (Oral)   Resp (!) 24   Ht 5\' 4"  (1.626 m)   Wt 75.8 kg (167 lb 1.7 oz)   SpO2 96%   BMI 28.68 kg/m   HEMODYNAMICS:    VENTILATOR SETTINGS:    INTAKE / OUTPUT: I/O last 3 completed shifts: In: 46.6 [I.V.:46.6] Out: 1325 [Urine:1325]   PHYSICAL EXAMINATION: General: adult female in NAD Neuro: Awake, alert. Flat affect. Answers appropriately. L sided facial droop residual.  HEENT: [place]/AT. PERRL, no JVD Cardiovascular: RRR, no M/R/G Lungs: Respirations even and unlabored.  CTA bilaterally, No W/R/R Abdomen: BS x 4, soft, NT/ND.  Musculoskeletal: No gross deformities, no edema.  Skin: Intact, warm, no rashes.  LABS:  BMET Recent  Labs  Lab 03/24/18 0520 03/24/18 1652 03/25/18 1818 03/26/18 0416  NA 137  --  135 131*  K 3.7 3.7 3.7 3.3*  CL 106  --  100 96*  CO2 24  --  24 22  BUN 8  --  7 7  CREATININE 1.15*  --  1.00 0.91  GLUCOSE 100*  --  100* 115*    Electrolytes Recent Labs  Lab 03/23/18 1641 03/24/18 0520 03/25/18 1818 03/26/18 0416  CALCIUM  --  8.3* 9.2 8.9  MG 1.1*  --   --  1.1*  PHOS  --   --   --  3.8    CBC Recent Labs  Lab 03/24/18 0520 03/25/18 1818 03/26/18 0416  WBC 7.6 9.8 11.0*  HGB 11.3* 13.6 13.1  HCT 33.9* 39.9 38.3  PLT 197 225 211    Coag's No results for input(s): APTT, INR in the last 168 hours.  Sepsis Markers No results for input(s): LATICACIDVEN, PROCALCITON, O2SATVEN in the last 168 hours.  ABG No results for input(s): PHART, PCO2ART, PO2ART in the last 168 hours.  Liver Enzymes Recent Labs  Lab 03/25/18 1818  AST 80*  ALT 15  ALKPHOS 64  BILITOT 1.0  ALBUMIN 3.9    Cardiac Enzymes Recent Labs  Lab 03/25/18 1818 03/25/18 2235 03/26/18 0416  TROPONINI 11.31* 12.15* 9.90*    Glucose Recent Labs  Lab 03/24/18 0618 03/24/18 1150 03/24/18 1613 03/24/18 2133 03/25/18 2337 03/26/18 0810  GLUCAP 88 106* 84 106* 110* 111*    Imaging Dg Chest 2 View  Result Date: 03/25/2018 CLINICAL DATA:  62 year old female with a history of dyspnea and headache EXAM: CHEST - 2 VIEW COMPARISON:  03/18/2018 FINDINGS: Cardiomediastinal silhouette unchanged in size and contour with surgical changes of prior CABG. Coarsened interstitial markings bilaterally. Lateral view demonstrates blunting of the costophrenic sulcus. Surgical changes of the right chest again demonstrated. No confluent airspace disease. No acute displaced fracture IMPRESSION: Chronic lung changes, with either scarring or small pleural effusion on the lateral view. No evidence of lobar pneumonia. Surgical changes of prior CABG. Electronically Signed   By: Gilmer Mor D.O.   On: 03/25/2018  19:45   Ct Head Wo Contrast  Result Date: 03/25/2018 CLINICAL DATA:  Mental status changes. Hypertension. Left-sided deficits from prior stroke. EXAM: CT HEAD WITHOUT CONTRAST TECHNIQUE: Contiguous axial images were obtained from the base of the skull through the vertex without intravenous contrast. COMPARISON:  Multiple exams, including 03/19/2018 FINDINGS: Brain: Right anterior middle cerebral artery distribution encephalomalacia with distribution of involvement similar to 03/19/2018. The brainstem and cerebellum appear unremarkable. Again observed is intraventricular hemorrhage filling most of the left lateral ventricle. There has been some clock contraction/reduction in overall volume, and the temporal horn of the left lateral ventricle is not dilated. Also the blood products in the third ventricle and in the right lateral ventricle appear to have intervally cleared. 6 mm of right-to-left midline shift, formerly 8 mm. Periventricular white matter and corona radiata hypodensities favor chronic ischemic microvascular white matter disease. No new hemorrhage is identified. Vascular: There is atherosclerotic calcification of the cavernous carotid arteries bilaterally. Skull: Unremarkable Sinuses/Orbits: Left mastoidectomy with small right mastoid effusion and fluid in the remaining left mastoid air cells. No middle ear fluid. Other: No supplemental non-categorized findings. IMPRESSION: 1. Resolution of the prior blood products in the right lateral ventricle and third ventricle. Mild reduction in volume of the hyperdense blood products filling most of the left lateral ventricle. Reduction in left-to-right midline shift from previous 8 mm to current 6 mm. 2. Right anterior MCA distribution encephalomalacia compatible with remote stroke. 3. Periventricular white matter and corona radiata hypodensities favor chronic ischemic microvascular white matter disease. 4. Small right mastoid effusion.  Prior left  mastoidectomy. Electronically Signed   By: Gaylyn Rong M.D.   On: 03/25/2018 19:33   STUDIES:  CT head 7/17 > resolution of prior blood products.  No new process.  CULTURES: None.  ANTIBIOTICS: None.  SIGNIFICANT EVENTS: 7/10 through 7/16 > admit. 7/17 > re-admit.  LINES/TUBES: None.  DISCUSSION: 62 y.o. female admitted 7/10 through 7/16 with IVH due to HTN and xarelto. Discharged 7/16 to SNF.  Returned 7/17 with hypertensive urgency and started on cleviprex; therefore, required ICU admission.  ASSESSMENT / PLAN:  PULMONARY A: Tobacco dependence Hx pulmonary nodules, emphysema P:   Tobacco cessation counseling Nicotine patch Albuterol PRN  CARDIOVASCULAR A:  HTN Urgency - presenting BP 177/121. Troponin bump - ? NSTEMI. Hx a.fib with RVR (anticoatulation stopped last admit due to IVH), HTN, HLD, CAD. P:  Troponin slowly trending down. No anticoagulation / antiplatelets given recent IVH. Continue home PO antihypertensives. Keep SBP < 120-151mmHg Will repeat echo given new troponin bump. Day team to please consult cardiology. Continue preadmission ASA, atorvastatin, diltiazem, lisinopril.  NEUROLOGIC A:   Headache - repeat CT negative for acute process. Recent IVH -  felt to be due to HTN and xarelto use. Hx depression. P:   Continue preadmission fioricet, bupropion, topiramate, escitalopram. F/u with neurology as outpatient for repeat CT head in 3 - 4 weeks. PT consult in AM.  Dispo: ICU, to tele once off infusion Code: Full DVT ppx: SCDs SUP: pepcid Diet: regular diet, thin liquid  Joneen Roach, AGACNP-BC [facility] Pulmonology/Critical Care Pager [phone] or [phone]  03/26/2018 10:15 AM

## 2018-03-26 NOTE — Progress Notes (Signed)
eLink Physician-Brief Progress Note Patient Name: [name] DOB: 1955/12/20 MRN: [id number]   Date of Service  03/26/2018  HPI/Events of Note  EKG: Afib with RVR Reviewing history, she had a similar episode of demand ischemia in 2016 after her last PCI Can't anticoagulate with recent hemorrhagic stroke Echo today was OK  eICU Interventions  Start amiodarone for rate control Formal cardiology consult in AM or sooner if hemodynamic status changes     Intervention Category Major Interventions: Arrhythmia - evaluation and management  Max Fickle 03/26/2018, 8:01 PM

## 2018-03-26 NOTE — Progress Notes (Signed)
eLink Physician-Brief Progress Note Patient Name: [name] DOB: [birth year] MRN: [id number]   Date of Service  03/26/2018  HPI/Events of Note  Troponin up, restless, somewhat confused but taking pills OK HR up  eICU Interventions  Take metoprolol now Repeat troponin EKG now     Intervention Category Intermediate Interventions: Diagnostic test evaluation  Max Fickle 03/26/2018, 7:39 PM

## 2018-03-26 NOTE — Progress Notes (Signed)
Rehab Admissions Coordinator Note:  Patient was screened by Clois Dupes for appropriateness for an Inpatient Acute Rehab Consult per PT recommendation. Noted pt was just discharged to SNF from last admit due to lack of support from roommates at home and son limited in his assistance that he can provide.  At this time, we are recommending Skilled Nursing Facility unless pt has caregiver support at home. Please clarify.   Clois Dupes 03/26/2018, 3:48 PM  I can be reached at [phone].

## 2018-03-27 DIAGNOSIS — I481 Persistent atrial fibrillation: Secondary | ICD-10-CM

## 2018-03-27 LAB — CBC
HEMATOCRIT: 39.6 % (ref 36.0–46.0)
HEMOGLOBIN: 13.7 g/dL (ref 12.0–15.0)
MCH: 33.9 pg (ref 26.0–34.0)
MCHC: 34.6 g/dL (ref 30.0–36.0)
MCV: 98 fL (ref 78.0–100.0)
Platelets: 251 10*3/uL (ref 150–400)
RBC: 4.04 MIL/uL (ref 3.87–5.11)
RDW: 12.7 % (ref 11.5–15.5)
WBC: 13.3 10*3/uL — AB (ref 4.0–10.5)

## 2018-03-27 LAB — BASIC METABOLIC PANEL
ANION GAP: 12 (ref 5–15)
BUN: 15 mg/dL (ref 6–20)
CHLORIDE: 98 mmol/L (ref 98–111)
CO2: 20 mmol/L — AB (ref 22–32)
Calcium: 9.1 mg/dL (ref 8.9–10.3)
Creatinine, Ser: 0.99 mg/dL (ref 0.44–1.00)
GFR calc non Af Amer: >60 mL/min (ref >60)
Glucose, Bld: 112 mg/dL — ABNORMAL HIGH (ref 70–99)
POTASSIUM: 3.4 mmol/L — AB (ref 3.5–5.1)
SODIUM: 130 mmol/L — AB (ref 135–145)

## 2018-03-27 LAB — MAGNESIUM: MAGNESIUM: 1.3 mg/dL — AB (ref 1.7–2.4)

## 2018-03-27 LAB — TROPONIN I: TROPONIN I: 9.54 ng/mL — AB (ref <0.03)

## 2018-03-27 LAB — PHOSPHORUS: PHOSPHORUS: 3.8 mg/dL (ref 2.5–4.6)

## 2018-03-27 MED ORDER — CLONIDINE HCL 0.1 MG PO TABS
0.1000 mg | ORAL_TABLET | Freq: Two times a day (BID) | ORAL | Status: DC
  Administered 2018-03-28 – 2018-04-01 (×8): 0.1 mg via ORAL
  Filled 2018-03-27 (×10): qty 1

## 2018-03-27 MED ORDER — CLONIDINE HCL 0.1 MG PO TABS
0.1000 mg | ORAL_TABLET | Freq: Once | ORAL | Status: AC
  Administered 2018-03-27: 0.1 mg via ORAL
  Filled 2018-03-27: qty 1

## 2018-03-27 NOTE — Progress Notes (Signed)
PULMONARY / CRITICAL CARE MEDICINE   Name: [name] MRN: [id number] DOB: May 01, 1956    ADMISSION DATE:  03/25/2018 CONSULTATION DATE:  03/25/18  REFERRING MD:  Erma Heritage  CHIEF COMPLAINT:  HTN Urgency  HISTORY OF PRESENT ILLNESS:[name] is a 62 y.o. female with PMH as outlined below. She had recent admission 03/18/18 through 03/24/18 for IVH felt to be due to HTN and xarelto use.  During that admission, xarelto was reversed and plavix was stopped.  Once hemorrhage was stable, low dose aspirin was resumed prior to discharge.  She was discharged 7/16 to SNF with plan to repeat CT head in 3 - 4 weeks and if IVH had resolved, then to start eliquis and stop aspirin.  Neurosurgery was also consulted but pt was felt to not be a surgical candidate.  On 7/17 while at SNF, pt had hypertension with SBP in 170s and up to 190s.  She was subsequently send to ED for further workup. In ED, she complained of headache.  CT head was negative for new process. She was started on cleviprex and PCCM was subsequently asked to admit to ICU. She denies any diplopia, blurry vision, pre-syncope, syncope, chest pain, dyspnea, N/V/D, abd pain.  SUBJECTIVE:  Awake alert complains of being overheated heart rate is well controlled amiodarone blood pressure is marginal 146/98.  VITAL SIGNS: BP (!) 144/102   Pulse 72   Temp 98.4 F (36.9 C)   Resp (!) 26   Ht 5\' 4"  (1.626 m)   Wt 169 lb 12.1 oz (77 kg)   SpO2 98%   BMI 29.14 kg/m   HEMODYNAMICS:    VENTILATOR SETTINGS:    INTAKE / OUTPUT: I/O last 3 completed shifts: In: 770.5 [I.V.:770.5] Out: 2315 [Urine:2315]   PHYSICAL EXAMINATION: General: Disheveled female who complains of man overheated HEENT: No JVD lymphadenopathy is appreciated, pupils are equal reactive. Neuro: Dull effect but awake alert follows commands complains of being overheated CV: Atrial fibrillation with a ventricular rate of 70 on amiodarone PULM: even/non-labored, lungs  bilaterally decreased in the bases WS:FKCL, non-tender, bsx4 active  Extremities: warm/dry, 1+ edema  Skin: no rashes or lesions   LABS:  BMET Recent Labs  Lab 03/25/18 1818 03/26/18 0416 03/27/18 0123  NA 135 131* 130*  K 3.7 3.3* 3.4*  CL 100 96* 98  CO2 24 22 20*  BUN 7 7 15   CREATININE 1.00 0.91 0.99  GLUCOSE 100* 115* 112*    Electrolytes Recent Labs  Lab 03/23/18 1641  03/25/18 1818 03/26/18 0416 03/27/18 0123  CALCIUM  --    < > 9.2 8.9 9.1  MG 1.1*  --   --  1.1* 1.3*  PHOS  --   --   --  3.8 3.8   < > = values in this interval not displayed.    CBC Recent Labs  Lab 03/25/18 1818 03/26/18 0416 03/27/18 0123  WBC 9.8 11.0* 13.3*  HGB 13.6 13.1 13.7  HCT 39.9 38.3 39.6  PLT 225 211 251    Coag's No results for input(s): APTT, INR in the last 168 hours.  Sepsis Markers No results for input(s): LATICACIDVEN, PROCALCITON, O2SATVEN in the last 168 hours.  ABG No results for input(s): PHART, PCO2ART, PO2ART in the last 168 hours.  Liver Enzymes Recent Labs  Lab 03/25/18 1818  AST 80*  ALT 15  ALKPHOS 64  BILITOT 1.0  ALBUMIN 3.9    Cardiac Enzymes Recent Labs  Lab 03/26/18 1252 03/26/18 1916  03/27/18 0123  TROPONINI 12.11* 12.40* 9.54*    Glucose Recent Labs  Lab 03/24/18 0618 03/24/18 1150 03/24/18 1613 03/24/18 2133 03/25/18 2337 03/26/18 0810  GLUCAP 88 106* 84 106* 110* 111*    Imaging No results found. STUDIES:  CT head 7/17 > resolution of prior blood products.  No new process.  CULTURES: None.  ANTIBIOTICS: None.  SIGNIFICANT EVENTS: 7/10 through 7/16 > admit. 7/17 > re-admit. 03/27/2018 A. fib rapid ventricular response requiring amiodarone  LINES/TUBES: None.  DISCUSSION: 62 y.o. female admitted 7/10 through 7/16 with IVH due to HTN and xarelto. Discharged 7/16 to SNF.  Returned 7/17 with hypertensive urgency and started on cleviprex; therefore, required ICU admission. 03/27/2018 required amiodarone  for atrial fibrillation ventricular response.  Remains marginally hypertensive.  Amiodarone stopped at this time cardiology consult has been paged .  ASSESSMENT / PLAN:  PULMONARY A: Tobacco dependence Hx pulmonary nodules, emphysema P:   Tobacco cessation O2 as needed Pulmonary toilet  CARDIOVASCULAR A:  HTN Urgency - presenting BP 177/121. Troponin bump - ? NSTEMI. Troponin (Point of Care Test) Hx a.fib with RVR (anticoatulation stopped last admit due to IVH), HTN, HLD, CAD. P:  Troponin coming down slowly from 12.4-9.5 No anticoagulation due to noted intraventricular hemorrhage With her atrial fibrillation requiring amiodarone plus Cardizem and her positive troponins will consult cardiology for further evaluation treatment.  She is not a candidate for anticoagulation due to her intraventricular hemorrhage. Stop nicotine patch  NEUROLOGIC A:   Headache - repeat CT negative for acute process. Recent IVH - felt to be due to HTN and xarelto use. Hx depression. P:   Continue home medications Follow-up with outpatient neurology Physical therapy as tolerated  Dispo: ICU, to tele once off infusion Code: Full DVT ppx: SCDs SUP: pepcid Diet: regular diet, thin liquid  Brett Canales Minor ACNP [facility] PCCM Pager [phone] till 1 pm If no answer page [phone] 03/27/2018, 10:13 AM

## 2018-03-27 NOTE — Consult Note (Addendum)
Cardiology Consultation:   Patient ID: [name]; [id number]; [birth year]   Admit date: 03/25/2018 Date of Consult: 03/27/2018  Primary Care Provider: Madelin Headings, MD Primary Cardiologist: Tobias Alexander, MD  Primary Electrophysiologist: Dr. Johney Frame  Patient Profile:   [name] is a 62 y.o. female with a hx of CAD s/p CABG and subsequent stenting of the native LAD, PAF, chronic combined systolic and diastolic heart failure (EF 40% in 2015 >> improved to 65-70% in 3/16), hypertension, CKD, OSA intolerant to CPAP, CVA in 2012, COPD, bradycardia, tobacco use and recent intraventricular hemorrhage who is being seen today for the evaluation of atrial fibrillation with RVR at the request of Dr. Carlota Raspberry.  History of Present Illness:   [name] had a recent admission 03/18/18-03/24/18 for IVH felt to be due to hypertension and Xarelto use.  During that admission Xarelto was reversed and Plavix was stopped.  Once hemorrhage was stable, low-dose aspirin was resumed prior to discharge.  She was discharged 7/16 to SNF with plan to repeat CT head in 3-4 weeks and if IVH had resolved, then to start Eliquis and stop aspirin.  Neurosurgery was also consulted but patient was felt not to be a surgical candidate.  On 7/17 while at SNF, the patient was hypertensive with systolic blood pressure in the 170s-190s.  She was sent to the ED for further work-up.  In the ED she complained of headache.  CT of the head was negative for new process.  She was started on Cleviprex and admitted to ICU.  Last night she went into atrial fibrillation with rapid ventricular response and amiodarone load was initiated.  At home she is on Cardizem 120 mg 12-hour capsule 3 times daily and this was continued.  She was last seen in the office 03/27/2017 by Dr. Delton See at which time she noted that the patient had been seen by Dr. Johney Frame in 09/2016 and was not interested in A. fib ablation or pacemaker placement for chronotropic  incompetence.  She was maintaining sinus rhythm and asymptomatic on amiodarone and Xarelto.  Was continued on Plavix, no aspirin due to need for anticoagulation.  At that time she was not on beta-blocker due to bradycardia.  It appears that she was not on diltiazem at that time either.  Upon my assessment the patient has a left facial droop and left-sided weakness.  She has a flat affect and is unable to answer most of my questions.  She denies any discomfort and appears comfortable except for mild increase in breathing effort.  Lungs are clear and she has no edema.  Past Medical History:  Diagnosis Date  . Acute right MCA stroke (HCC) 11/07/10  . Atrial fibrillation (HCC)    a. s/p TEE-DCCV 02/2104; b. Xarelto started  . CAD (coronary artery disease)    a.  cath 09/2010: LAD stent patent, S-Int/dCFX ok, S-PDA ok, L-LAD atretic;  b. Lexiscan Myoview (02/2014):  no ischemia, EF 55%; c. 11/2014 Cath/PCI: LM nl, LAD 20pISR, LCX 80-25m, OM1 nl, RI 70p, RCA 40-4m, RPDA 95ost/95-36m (PTCA only w/ reduction to 50p/35m), 60d, L->LAD atretic, VG->RI->OM nl, VG->PDA 100p.  . Cardiomyopathy with EF 40% at TEE 02/17/14 (likely tachycardia mediated - Myoview 02/19/14 neg for ischemia with normal EF) 02/18/2014  . COPD (chronic obstructive pulmonary disease) (HCC)   . Depression   . Eczema   . GERD (gastroesophageal reflux disease)   . Headache   . HLD (hyperlipidemia)   . Homelessness 11/12/2011  . HPV test  positive    with Ascus on pap 2015, followed by Dr Marcelle Overlie  . 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 Hx of CABG   . Hx of transesophageal echocardiography (TEE) for monitoring 11/2010   TEE 11/2010: EF 60-65%, BAE, trivial atrial septal shunt;  right heart cath in 10/2010 with elevated R and L heart pressures and diuretic started  . Hypertension   . Hypothyroidism   . Persistent atrial fibrillation (HCC) 10/2014  . Pulmonary nodules    repeat CT due in 11/2011  . Sleep apnea    recent sleep  study  in 04/2014 per chart review  shows no significant OSA    Past Surgical History:  Procedure Laterality Date  . bilateral knee surgery    . BLADDER SURGERY    . CARDIOVERSION N/A 02/17/2014   Procedure: CARDIOVERSION;  Surgeon: Pricilla Riffle, MD;  Location: [facility] ENDOSCOPY;  Service: Cardiovascular;  Laterality: N/A;  . CARDIOVERSION N/A 09/20/2016   Procedure: CARDIOVERSION;  Surgeon: Lars Masson, MD;  Location: [facility] ENDOSCOPY;  Service: Cardiovascular;  Laterality: N/A;  . cath 2012    . CHEST WALL RECONSTRUCTION    . CORONARY ARTERY BYPASS GRAFT    . debriment for infection in chest    . HERNIA REPAIR    . LEFT AND RIGHT HEART CATHETERIZATION WITH CORONARY ANGIOGRAM N/A 11/10/2014   Procedure: LEFT AND RIGHT HEART CATHETERIZATION WITH CORONARY ANGIOGRAM;  Surgeon: Marykay Lex, MD;  Location: [facility] CATH LAB;  Service: Cardiovascular;  Laterality: N/A;  . Left mastoidectomy    . TEE WITHOUT CARDIOVERSION N/A 02/17/2014   Procedure: TRANSESOPHAGEAL ECHOCARDIOGRAM (TEE);  Surgeon: Pricilla Riffle, MD;  Location: [facility] ENDOSCOPY;  Service: Cardiovascular;  Laterality: N/A;     Home Medications:  Prior to Admission medications   Medication Sig Start Date End Date Taking? Authorizing Provider  aspirin EC 81 MG EC tablet Take 1 tablet (81 mg total) by mouth daily. 03/25/18   Layne Benton, NP  atorvastatin (LIPITOR) 40 MG tablet Take 1 tablet (40 mg total) by mouth daily at 6 PM. 03/24/18   Layne Benton, NP  buPROPion (WELLBUTRIN XL) 300 MG 24 hr tablet Take 1 tablet (300 mg total) by mouth every morning. 10/29/17   Plovsky, Earvin Hansen, MD  butalbital-acetaminophen-caffeine (FIORICET, ESGIC) [phone] MG tablet Take 1 tablet by mouth every 8 (eight) hours as needed for headache. 03/24/18   Layne Benton, NP  diltiazem (CARDIZEM SR) 120 MG 12 hr capsule Take 1 capsule (120 mg total) by mouth 3 (three) times daily. 03/24/18   Layne Benton, NP  escitalopram (LEXAPRO) 10 MG tablet Take 1 tablet (10 mg  total) by mouth daily. 10/29/17 10/29/18  Plovsky, Earvin Hansen, MD  fluticasone (FLONASE) 50 MCG/ACT nasal spray Place 2 sprays into both nostrils daily. 08/15/16   Panosh, Neta Mends, MD  levothyroxine (SYNTHROID, LEVOTHROID) 200 MCG tablet Take 1 tablet (200 mcg total) by mouth daily before breakfast. 03/25/18   Layne Benton, NP  lisinopril (PRINIVIL,ZESTRIL) 20 MG tablet Take 1 tablet (20 mg total) by mouth 2 (two) times daily. 03/24/18   Layne Benton, NP  magnesium oxide (MAG-OX) 400 MG tablet Take 1 tablet (400 mg total) by mouth daily. 09/17/16   Lars Masson, MD  PROAIR HFA 108 [phone] Base) MCG/ACT inhaler INHALE 2 PUFFS BY MOUTH EVERY 6 HOURS AS NEEDED FOR SHORTNESS OF BREATH 02/03/18   Leslye Peer, MD  ranitidine Orpha Bur)  150 MG tablet Take 1 tablet (150 mg total) by mouth 2 (two) times daily. Patient taking differently: Take 150 mg by mouth 2 (two) times daily as needed for heartburn.  04/16/17   Panosh, Neta Mends, MD  topiramate (TOPAMAX) 50 MG tablet Take 1 tablet (50 mg total) by mouth 2 (two) times daily. 10/13/17   Micki Riley, MD  vitamin B-12 1000 MCG tablet Take 1 tablet (1,000 mcg total) by mouth daily. 03/25/18   Layne Benton, NP    Inpatient Medications: Scheduled Meds: . aspirin EC  81 mg Oral Daily  . atorvastatin  40 mg Oral q1800  . buPROPion  300 mg Oral q morning - 10a  . diltiazem  120 mg Oral Q8H  . escitalopram  10 mg Oral Daily  . famotidine  20 mg Oral Daily  . fluticasone  2 spray Each Nare Daily  . levothyroxine  200 mcg Oral QAC breakfast  . lisinopril  20 mg Oral BID  . magnesium oxide  400 mg Oral Daily  . metoprolol tartrate  25 mg Oral BID  . topiramate  50 mg Oral BID  . cyanocobalamin  1,000 mcg Oral Daily   Continuous Infusions: . sodium chloride Stopped (03/26/18 1430)  . clevidipine Stopped (03/26/18 0955)   PRN Meds: sodium chloride, butalbital-acetaminophen-caffeine, hydrALAZINE  Allergies:    Allergies  Allergen Reactions  . Avelox  [Moxifloxacin Hcl In Nacl] Other (See Comments)    Mental breakdown.  [name] [Nortriptyline Hcl] Other (See Comments)    Made her want to hurt herself  . Amoxicillin Hives  . Hydrocodone Itching  . Oxycodone Itching  . Penicillins Hives       . Sulfa Antibiotics Other (See Comments)    unknown    Social History:   Social History   Socioeconomic History  . Marital status: Divorced    Spouse name: Not on file  . Number of children: 2  . Years of education: Not on file  . Highest education level: Not on file  Occupational History  . Occupation: DISABLE   Social Needs  . Financial resource strain: Not on file  . Food insecurity:    Worry: Not on file    Inability: Not on file  . Transportation needs:    Medical: Not on file    Non-medical: Not on file  Tobacco Use  . Smoking status: Current Every Day Smoker    Packs/day: 0.50    Years: 30.00    Pack years: 15.00    Types: Cigarettes    Last attempt to quit: 09/10/2014    Years since quitting: 3.5  . Smokeless tobacco: Never Used  Substance and Sexual Activity  . Alcohol use: No    Alcohol/week: 0.0 oz  . Drug use: No  . Sexual activity: Not Currently  Lifestyle  . Physical activity:    Days per week: Not on file    Minutes per session: Not on file  . Stress: Not on file  Relationships  . Social connections:    Talks on phone: Not on file    Gets together: Not on file    Attends religious service: Not on file    Active member of club or organization: Not on file    Attends meetings of clubs or organizations: Not on file    Relationship status: Not on file  . Intimate partner violence:    Fear of current or ex partner: Not on file    Emotionally  abused: Not on file    Physically abused: Not on file    Forced sexual activity: Not on file  Other Topics Concern  . Not on file  Social History Narrative   Divorced   After stroke was living with sister and mother after sister asked her to leave. Mom has since  deceased    Difficulties over control.    Then moved back in for about 6 weeks.   . tranportaion difficult son  helping   Ex-smoker   Was living at friends  Sister and her had argument and she was told to leave .    She was living in a homeless shelter for the last 3 weeks [facility]    son had help with transportation and medication       Work status regular  before got sick on short-term disability now lost t insurance after the stroke and couldn't work was  denied ssi    2 times.  She is not eligible for Medicaid because she doesn't have dependent children.         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   Lives in  rented house 2 house mates males keep tp self has her own b room  Doing well     Family History:    Family History  Problem Relation Age of Onset  . COPD Mother   . Heart disease Mother   . Arthritis Mother        Rheumatoid and PMR  . Osteoporosis Mother        Mom fractured hip  . Diabetes type II Mother   . Depression Mother   . Heart attack Father   . Depression Father   . Hypertension Father   . Alcohol abuse Father   . Depression Sister   . Anxiety disorder Sister   . Drug abuse Sister   . Stroke Neg Hx      ROS:  Please see the history of present illness.   All other ROS reviewed and negative.     Physical Exam/Data:   Vitals:   03/27/18 1144 03/27/18 1200 03/27/18 1300 03/27/18 1400  BP:  (!) 147/101 (!) 137/99 (!) 151/96  Pulse:  79 70   Resp:  (!) 23 (!) 24 (!) 27  Temp: 97.8 F (36.6 C)     TempSrc: Oral     SpO2:  97% 97%   Weight:      Height:        Intake/Output Summary (Last 24 hours) at 03/27/2018 1526 Last data filed at 03/27/2018 1400 Gross per 24 hour  Intake 741.32 ml  Output 605 ml  Net 136.32 ml    Filed Weights   03/25/18 2335 03/26/18 0500 03/27/18 0500  Weight: 166 lb 14.2 oz (75.7 kg) 167 lb 1.7 oz (75.8 kg) 169 lb 12.1 oz (77 kg)   Body mass index is 29.14 kg/m.  General: Chronically ill-appearing, in no acute distress HEENT: normal Lymph: no adenopathy Neck: no JVD Endocrine:  No thryomegaly Vascular: No carotid bruits; pedal pulses 2+ Cardiac:  normal S1, S2; irregularly irregular rhythm, normal rate; no murmur Lungs:  clear to auscultation bilaterally, no wheezing, rhonchi or rales  Abd: soft, nontender, no hepatomegaly  Ext:  no edema Musculoskeletal:  No deformities, BUE and BLE strength normal and equal Skin: warm and dry  Neuro: Left facial droop, left-sided weakness, unequal pupils Psych: Flat affect   EKG:  The EKG was personally reviewed and demonstrates: Atrial fibrillation, 109 bpm with diffuse T wave inversions-anterior and laterally-that were also previously present in 2018 Telemetry:  Telemetry was personally reviewed and demonstrates: Atrial fibrillation in the 70s  Relevant CV Studies:  Echocardiogram 03/26/2018 Study Conclusions - Left ventricle: The cavity size was normal. Systolic function was at the lower limits of normal. The estimated ejection fraction was 50%. Although no diagnostic regional wall motion abnormality was identified, this possibility cannot be completely excluded on the basis of this study. - Aortic valve: Trileaflet; mildly thickened, mildly calcified   leaflets. Transvalvular velocity was within the normal range.   There was no stenosis. There was no regurgitation. Valve area (VTI): 1.45 cm^2. Valve area (Vmax): 2 cm^2. Valve area (Vmean): 1.73 cm^2. - Mitral valve: Calcified annulus. There was trivial regurgitation. - Left atrium: The atrium was moderately dilated. - Right ventricle: Systolic function was mildly reduced. - Right atrium: The atrium was moderately dilated. - Tricuspid valve: There was trivial regurgitation. -  Pulmonic valve: There was no significant regurgitation.  Impressions: - Technically difficult study with limited anatomical views.   Appears largely unchanged from recent echo of 03/19/18.   LHC/PCI 11/10/14 LM: ok LAD: stent in the proximal segment that is roughly 20% in-stent restenosis. RI: 70% proximal disease.  LCx:  80-90% stenosis in the mid AV groove segment RCA: prox 40%, mid 40-50%, RPDA ostial 95% then 95-99% then 60% L-LAD:  Atretic (old) S-RI/OM:  Patent  S-RPDA:  prox 100% PCI:  Suboptimal PTCA only of the ostial RPDA and mid RPDA leading to the anastomosis site. Unable to fully expand the balloon.  Echo 11/08/14 - Mild focal basal hypertrophy of the septum. LVF vigorous. EF 65% to 70%.  HK basalinferior myocardium. Grade 1 diastolic dysfunction. - Aortic valve: There was trivial regurgitation. - Left atrium: The atrium was mildly dilated. Impressions: Compared to the prior study, there has been no significant interval change.  Myoview 02/19/14 IMPRESSION: No evidence for reversibility or myocardial ischemia.  Calculated ejection fraction is 55%.  Carotid US 5/15  No sig ICA stenosis   Laboratory Data:  Chemistry Recent Labs  Lab 03/25/18 1818 03/26/18 0416 03/27/18 0123  NA 135 131* 130*  K 3.7 3.3* 3.4*  CL 100 96* 98  CO2 24 22 20*  GLUCOSE 100* 115* 112*  BUN 7 7 15   CREATININE 1.00 0.91 0.99  CALCIUM 9.2 8.9 9.1  GFRNONAA 60* >60 >60  GFRAA >60 >60 >60  ANIONGAP 11 13 12     Recent Labs  Lab 03/25/18 1818  PROT 6.9  ALBUMIN 3.9  AST 80*  ALT 15  ALKPHOS 64  BILITOT 1.0   Hematology Recent Labs  Lab 03/25/18 1818 03/26/18 0416 03/27/18 0123  WBC 9.8 11.0* 13.3*  RBC 4.00 3.87 4.04  HGB 13.6 13.1 13.7  HCT 39.9 38.3 39.6  MCV 99.8 99.0 98.0  MCH 34.0 33.9 33.9  MCHC 34.1 34.2 34.6  RDW 12.8 12.6 12.7  PLT 225 211 251   Cardiac Enzymes Recent Labs  Lab 03/25/18 1818 03/25/18 2235 03/26/18 0416 03/26/18 1252  03/26/18 1916 03/27/18 0123  TROPONINI 11.31* 12.15* 9.90* 12.11* 12.40* 9.54*   No results for input(s): TROPIPOC in the last 168 hours.  BNP Recent Labs  Lab 03/25/18 2016  BNP 608.8*    DDimer No results for input(s): DDIMER in the last 168 hours.  Radiology/Studies:  Dg Chest 2 View  Result Date: 03/25/2018 CLINICAL DATA:  62 year old female with a history of dyspnea and headache EXAM: CHEST - 2 VIEW COMPARISON:  03/18/2018 FINDINGS: Cardiomediastinal silhouette unchanged in size and contour with surgical changes of prior CABG. Coarsened interstitial markings bilaterally. Lateral view demonstrates blunting of the costophrenic sulcus. Surgical changes of the right chest again demonstrated. No confluent airspace disease. No acute displaced fracture IMPRESSION: Chronic lung changes, with either scarring or small pleural effusion on the lateral view. No evidence of lobar pneumonia. Surgical changes of prior CABG. Electronically Signed   By: Gilmer Mor D.O.   On: 03/25/2018 19:45   Ct Head Wo Contrast  Result Date: 03/25/2018 CLINICAL DATA:  Mental status changes. Hypertension. Left-sided deficits from prior stroke. EXAM: CT HEAD WITHOUT CONTRAST TECHNIQUE: Contiguous axial images were obtained from the base of the skull through the vertex without intravenous contrast. COMPARISON:  Multiple exams, including 03/19/2018 FINDINGS: Brain: Right anterior middle cerebral artery distribution encephalomalacia with distribution of involvement similar to 03/19/2018. The brainstem and cerebellum appear unremarkable. Again observed is intraventricular hemorrhage filling most of the left lateral ventricle. There has been some clock contraction/reduction in overall volume, and the temporal horn of the left lateral ventricle is not dilated. Also the blood products in the third ventricle and in the right lateral ventricle appear to have intervally cleared. 6 mm of right-to-left midline shift, formerly 8 mm.  Periventricular white matter and corona radiata hypodensities favor chronic ischemic microvascular white matter disease. No new hemorrhage is identified. Vascular: There is atherosclerotic calcification of the cavernous carotid arteries bilaterally. Skull: Unremarkable Sinuses/Orbits: Left mastoidectomy with small right mastoid effusion and fluid in the remaining left mastoid air cells. No middle ear fluid. Other: No supplemental non-categorized findings. IMPRESSION: 1. Resolution of the prior blood products in the right lateral ventricle and third ventricle. Mild reduction in volume of the hyperdense blood products filling most of the left lateral ventricle. Reduction in left-to-right midline shift from previous 8 mm to current 6 mm. 2. Right anterior MCA distribution encephalomalacia compatible with remote stroke. 3. Periventricular white matter and corona radiata hypodensities favor chronic ischemic microvascular white matter disease. 4. Small right mastoid effusion.  Prior left mastoidectomy. Electronically Signed   By: Gaylyn Rong M.D.   On: 03/25/2018 19:33    Assessment and Plan:   Atrial fibrillation with RVR -History of paroxysmal atrial fibrillation, controlled in the past with amiodarone. Unclear when amio was stopped. It was noted at her office visit in 03/2017 but was not on her med list at recent hospitalization.  -During recent hospitalization for hemorrhagic stroke, the patient developed A. fib with RVR and was started on Cardizem.  -Now admitted for hypertensive urgency and again developed afib with RVR last night. She was started on amiodarone load and continued on her oral cardizem.  She is now in A. fib with rate controlled in the 70s.  The amiodarone has been stopped. Would not use amiodarone as she is not anticoagulated and we do not want to induce conversion to SR.  -She was not on BB in the past due to bradycardia, now metoprolol 25 mg bid has been started. Watch for  bradycardia. Seen by Dr. Johney Frame for afib and chronotropic incompetence in 2018 and did not want PPM.  -Continue current rate control. Now in the 80's. -CHA2DS2/VAS Stroke Risk Score is 6 (CHF,  CAD, HTN, stroke(2), female) . She is off anticoagulation for intraventricular hemorrhage 7/10/19at which time her anticoagulation was reversed and plavix stopped. She was started on aspirin 81 mg at discharge with plan to repeat CT head in 3-4 weeks and if IVH had resolved, then to start Eliquis and stop aspirin.     Elevated troponin -Troponins 11.31> 12.15> 9.90> 12.11> 12.40> 9.54.  Troponin level was normal during prior hospitalization 03/18/2018 -No ST elevations on EKG.  -No new changes on echo. -The patient is denying chest discomfort, appears to be mildly dyspneic -With recent hemorrhagic stroke she would not be a candidate for cardiac cath at this time unless she becomes unstable  CAD - hx of CAD s/p CABG 2007 and subsequent stenting of the native LAD early after and then PTCA of RPDA 11/2014 -Plavix was discontinued 03/18/2018 due to hemorrhagic stroke.  She is now on aspirin 81 mg. -Continues on statin.  Beta-blocker has just been started.  She has not been on beta-blocker in the past due to bradycardia -no apparent angina, pt difficult to obtain information from  Hypertensive urgency -Home medications include Cardizem SR 120 mg 12-hour capsule 3 times daily, lisinopril 20 mg twice daily. -Was on Cleviprex which is now dc'd.  -On home meds plus clonidine 0.1 mg BID and metoprolol 25 mg bid -BP still  Moderately elevated.  Management per PCCM.  (Amlodipine stopped during prior hospitalization when diltiazem started)  Recent intraventricular hemorrhage -Plavix and Xarelto are on hold per neurology. -Patient has left-sided facial droop and left-sided weakness, flat affect and is not able to answer complex questions  Tobacco use  Hyperlipidemia -Atorvastatin 40 mg.  LDL was 148 on  03/21/2018.  Unclear whether she was taking her atorvastatin.  For questions or updates, please contact [facility] Please consult [email or web address] for contact info under Cardiology/STEMI.   Signed, Berton Bon, NP  03/27/2018 3:26 PM   Patient examined chart reviewed. Discussed care with patient and PA. Unfortunate situation with significant CVA And SAH. Plavix and xarelto held for at least 3 more weeks. Rate control of fib/flutter cardizem and lopressor.  Would not use amiodarone again as we do not want to actively convert while not on anticoagulation. ECG with non specific ST changes no ST elevation Troponin elevation concerning but echo yesterday with low normal EF no defined  RWMA and patient is having no chest pain. She would not be a candidate for cath due to recent [facility] and inability to use blood thinners so goal will be rate control and BP control EXam with left neglect and LU/LLE hemiparesis Lungs Clear anteriorly no murmur trace edema   Charlton Haws

## 2018-03-27 NOTE — Care Management Note (Signed)
Case Management Note  Patient Details  Name: SAAYA NYHUS MRN: [id number] Date of Birth: [birth year]  Subjective/Objective:   From [facility],She had recent admission 03/18/18 through 03/24/18 for IVH felt to be due to HTN and xarelto use.  During that admission, xarelto was reversed and plavix was stopped.  Once hemorrhage was stable, low dose aspirin was resumed prior to discharge on 7/16.   She is readmitted on 7/17 for HTN 170's and 190's. Per CIR rep patient is not candidate and will need to go to snf at dc.                    Action/Plan: Plan for dc to SNF when ready.  Expected Discharge Date:                  Expected Discharge Plan:  Skilled Nursing Facility  In-House Referral:  Clinical Social Work  Discharge planning Services  CM Consult  Post Acute Care Choice:    Choice offered to:     DME Arranged:    DME Agency:     HH Arranged:    HH Agency:     Status of Service:  In process, will continue to follow  If discussed at Long Length of Stay Meetings, dates discussed:    Additional Comments:  Leone Haven, RN 03/27/2018, 12:24 PM

## 2018-03-27 NOTE — Progress Notes (Signed)
Physical Therapy Treatment Patient Details Name: [name] MRN: [id number] DOB: 1956/02/03 Today's Date: 03/27/2018    History of Present Illness Pt is a 62 y.o. female admitted from SNF on 03/25/18 with hypertensive urgency and fluid overload. Of note, recent admit for R MCA infarct with IVH on 03/19/18 with d/c to SNF on 03/24/18. PMH includes CAD, HF, HTN, a-fib (not on anticoagulation), R MCA infarct (2012).    PT Comments    Pt limited in mobility progression today by HA, dizziness and nausea. Pt minA for bed mobility, transfers and side stepping to HoB with RW. Focus of session on balance as pt with c/o of instability. In standing EoB, for 5 minutes pt with no overt signs of balance loss, yet pt repeatedly stated that she felt unsteady. Educated in gaze stabilization to decrease dizziness and unbalanced feeling. PT recommends SNF level rehab at discharge. PT will continue to follow acutely.    Follow Up Recommendations  SNF(Rehab feels given decreased caregiver support SNF appropriat)     Equipment Recommendations  (TBD)    Recommendations for Other Services OT consult     Precautions / Restrictions Precautions Precautions: Fall Restrictions Weight Bearing Restrictions: No    Mobility  Bed Mobility Overal bed mobility: Needs Assistance Bed Mobility: Supine to Sit     Supine to sit: Min assist     General bed mobility comments: minA for bringing trunk to upright, repeated cuing for scooting hips to EoB, eventually moved to EoB  Transfers Overall transfer level: Needs assistance Equipment used: 1 person hand held assist Transfers: Sit to/from Stand Sit to Stand: Min assist         General transfer comment: minA for power up and steadying  Ambulation/Gait Ambulation/Gait assistance: Min assist Gait Distance (Feet): 4 Feet Assistive device: Rolling walker (2 wheeled) Gait Pattern/deviations: Step-to pattern Gait velocity: Decreased Gait velocity  interpretation: <1.31 ft/sec, indicative of household ambulator General Gait Details: minA for steadying for very slow stepping towards head of bed, c/o of HA and nausea requesting getting back to bed         Balance Overall balance assessment: Needs assistance Sitting-balance support: No upper extremity supported;Feet supported Sitting balance-Leahy Scale: Fair Sitting balance - Comments: Posterior LOB when attempting to don socks sitting EOB   Standing balance support: Bilateral upper extremity supported Standing balance-Leahy Scale: Poor Standing balance comment: UE support for balance                            Cognition Arousal/Alertness: Awake/alert Behavior During Therapy: Flat affect Overall Cognitive Status: No family/caregiver present to determine baseline cognitive functioning Area of Impairment: Orientation;Attention;Memory;Following commands;Safety/judgement;Awareness;Problem solving                 Orientation Level: Disoriented to;Time;Situation Current Attention Level: Sustained Memory: Decreased short-term memory Following Commands: Follows one step commands with increased time;Follows multi-step commands inconsistently Safety/Judgement: Decreased awareness of safety;Decreased awareness of deficits Awareness: Intellectual Problem Solving: Slow processing;Decreased initiation;Requires verbal cues General Comments: pt very slow to process information, when asked how she felt, she replied "Why are you asking me such hard questions?", frequently doesn't respond questions      Exercises Other Exercises Other Exercises: stood for 5 minutes with RW with min guard     General Comments General comments (skin integrity, edema, etc.): BP prior to mobility 149/101 after mobility 144/98, HR max with activity 72 bpm, SaO2 on RA 99%O2  Pertinent Vitals/Pain Pain Assessment: Faces Faces Pain Scale: Hurts little more Pain Location: headache Pain  Descriptors / Indicators: Headache Pain Intervention(s): Limited activity within patient's tolerance;Monitored during session;Repositioned    Home Living Family/patient expects to be discharged to:: Skilled nursing facility                    Prior Function Level of Independence: Needs assistance  Gait / Transfers Assistance Needed: Poor historian. Per chart was amb 20' with RW and modA due to L-side hemiparesis with acute PT on 03/24/18; likely did not have enough time to begin with PT/OT at SNF due to quick readmission       PT Goals (current goals can now be found in the care plan section) Acute Rehab PT Goals Patient Stated Goal: Get rid of headache PT Goal Formulation: With patient Time For Goal Achievement: 04/16/18 Potential to Achieve Goals: Good Progress towards PT goals: Not progressing toward goals - comment(limited by HA and nausea )    Frequency    Min 3X/week      PT Plan Discharge plan needs to be updated       AM-PAC PT "6 Clicks" Daily Activity  Outcome Measure  Difficulty turning over in bed (including adjusting bedclothes, sheets and blankets)?: None Difficulty moving from lying on back to sitting on the side of the bed? : Unable Difficulty sitting down on and standing up from a chair with arms (e.g., wheelchair, bedside commode, etc,.)?: Unable Help needed moving to and from a bed to chair (including a wheelchair)?: A Little Help needed walking in hospital room?: A Little Help needed climbing 3-5 steps with a railing? : A Lot 6 Click Score: 14    End of Session Equipment Utilized During Treatment: Gait belt Activity Tolerance: Patient limited by fatigue Patient left: in chair;with chair alarm set;with call bell/phone within reach Nurse Communication: Mobility status PT Visit Diagnosis: Other abnormalities of gait and mobility (R26.89);Other symptoms and signs involving the nervous system (R29.898);Muscle weakness (generalized) (M62.81)      Time: 5176-1607 PT Time Calculation (min) (ACUTE ONLY): 22 min  Charges:  $Therapeutic Activity: 23-37 mins                    G Codes:       [name] B. Beverely Risen PT, DPT Acute Rehabilitation  [phone] Pager [phone]     [name] 03/27/2018, 6:06 PM

## 2018-03-27 NOTE — Progress Notes (Signed)
Pt arrived to 2C12 from via wheelchair accompanied by RN. Patient alert, able to stand and pivot to bed with moderate assistance. Patient denies pain or discomfort at this time.  Alwyn Ren, RN

## 2018-03-28 LAB — BASIC METABOLIC PANEL
ANION GAP: 10 (ref 5–15)
BUN: 19 mg/dL (ref 6–20)
CALCIUM: 9.1 mg/dL (ref 8.9–10.3)
CHLORIDE: 97 mmol/L — AB (ref 98–111)
CO2: 23 mmol/L (ref 22–32)
Creatinine, Ser: 0.91 mg/dL (ref 0.44–1.00)
GFR calc non Af Amer: >60 mL/min (ref >60)
Glucose, Bld: 94 mg/dL (ref 70–99)
POTASSIUM: 3.4 mmol/L — AB (ref 3.5–5.1)
Sodium: 130 mmol/L — ABNORMAL LOW (ref 135–145)

## 2018-03-28 LAB — MAGNESIUM: Magnesium: 1.4 mg/dL — ABNORMAL LOW (ref 1.7–2.4)

## 2018-03-28 LAB — PHOSPHORUS: PHOSPHORUS: 3.3 mg/dL (ref 2.5–4.6)

## 2018-03-28 MED ORDER — MAGNESIUM SULFATE 2 GM/50ML IV SOLN
2.0000 g | Freq: Once | INTRAVENOUS | Status: AC
  Administered 2018-03-28: 2 g via INTRAVENOUS
  Filled 2018-03-28: qty 50

## 2018-03-28 MED ORDER — BUPROPION HCL ER (XL) 150 MG PO TB24
150.0000 mg | ORAL_TABLET | Freq: Every morning | ORAL | Status: DC
  Administered 2018-03-29 – 2018-04-01 (×4): 150 mg via ORAL
  Filled 2018-03-28 (×4): qty 1

## 2018-03-28 MED ORDER — POTASSIUM CHLORIDE CRYS ER 20 MEQ PO TBCR
40.0000 meq | EXTENDED_RELEASE_TABLET | Freq: Once | ORAL | Status: AC
  Administered 2018-03-28: 40 meq via ORAL
  Filled 2018-03-28: qty 2

## 2018-03-28 NOTE — Progress Notes (Signed)
Patient Demographics:    [name], is a 62 y.o. female, DOB - 1956/03/19, VQQ:[id number]  Admit date - 03/25/2018   Admitting Physician Carin Hock, MD  Outpatient Primary MD for the patient is Panosh, Tina Mends, MD  LOS - 3   Chief Complaint  Patient presents with  . Headache        Subjective:    [name] today has no fevers, no emesis,  No chest pain, able to answer simple questions, appears comfortable  Assessment  & Plan :    Active Problems:   H/O: CVA (cerebrovascular accident)   Hypertensive emergency  Brief summary 62 y.o. female with a hx of CAD s/p CABG and subsequent stenting of the native LAD, PAF, chronic combined systolic and diastolic heart failure (EF 40% in 2015 >>improved to 65-70% in 3/16), hypertension, CKD, OSA intolerant to CPAP, CVA in 2012, COPD, bradycardia, tobacco use and recent intraventricular hemorrhage (03/18/18) , , readmitted from SNF to PCCM service on 03/25/2018 with headaches and elevated blood pressure and concerns about ICH, repeat CT head did not show any new brain bleed, transferred from [facility] service to hospitalist service on 03/28/2018   Plan:- 1)HTN-patient is off Cleviprex drip, BP is more stable, continue clonidine 0.1 mg twice daily, Cardizem 120 mg twice daily, lisinopril 20 mg twice daily, metoprolol 25 mg twice daily, okay to use PRN IV hydralazine for elevated BP  2) H/o Prior RCA MCA infarct and Recent ICH/IVH-  patient had intraventricular hemorrhage (03/18/18) while on Xarelto and Plavix for stroke and CAD, neurosurgery advised conservative non-operative approach, repeat CT head in 3 to 4 weeks advised, if negative consider restarting Eliquis alone without antiplatelet agents, patient was readmitted 03/25/2018 with elevated blood pressure and headaches, repeat CT head on 03/25/2018 without acute bleed   3)CAD-post prior CABG and status post  prior stenting stable, no ACS type symptoms, continue aspirin 81 mg daily, along with metoprolol 25 mill grams twice daily and Lipitor  4)PAfib--rate controlled better with p.o. Cardizem and p.o. metoprolol, not on anticoagulation due to recent intraventricular hemorrhage, avoid amiodarone in order to avoid inducing conversion to sinus rhythm which may increase stroke risk as patient cannot be anticoagulated\  5)COPD--stable, bronchodilators as needed  6)HAs--Topamax for headache prophylaxis, Fioricet as needed for headache  7)Mood Disorder-- Taper off Wellbutrin given history of strokes and intracranial hemorrhage patient is at increased risk for seizures  8)Hypothyroidism--stable, continue levothyroxine  Code Status : Full code  Disposition Plan  : SNF  Consults  : Cardiology, PCCM   DVT Prophylaxis  :   SCDs (recent ICH)  Lab Results  Component Value Date   PLT 251 03/27/2018    Inpatient Medications  Scheduled Meds: . aspirin EC  81 mg Oral Daily  . atorvastatin  40 mg Oral q1800  . buPROPion  300 mg Oral q morning - 10a  . cloNIDine  0.1 mg Oral BID  . diltiazem  120 mg Oral Q8H  . escitalopram  10 mg Oral Daily  . famotidine  20 mg Oral Daily  . fluticasone  2 spray Each Nare Daily  . levothyroxine  200 mcg Oral QAC breakfast  . lisinopril  20 mg Oral BID  . magnesium oxide  400 mg Oral Daily  . metoprolol tartrate  25 mg Oral BID  . topiramate  50 mg Oral BID  . cyanocobalamin  1,000 mcg Oral Daily   Continuous Infusions: . sodium chloride Stopped (03/26/18 1430)  . clevidipine Stopped (03/26/18 0955)   PRN Meds:.sodium chloride, butalbital-acetaminophen-caffeine, hydrALAZINE    Anti-infectives (From admission, onward)   None        Objective:   Vitals:   03/28/18 1100 03/28/18 1200 03/28/18 1400 03/28/18 1433  BP: (!) 132/110 115/82 97/86 105/75  Pulse: 88 (!) 58 (!) 40 60  Resp: (!) 22 (!) 25 (!) 23 (!) 26  Temp: 97.8 F (36.6 C)   98 F (36.7  C)  TempSrc: Oral   Oral  SpO2: 97%   96%  Weight:      Height:        Wt Readings from Last 3 Encounters:  03/28/18 75.1 kg (165 lb 9.1 oz)  03/18/18 77.7 kg (171 lb 4.8 oz)  03/22/18 77.6 kg (171 lb)     Intake/Output Summary (Last 24 hours) at 03/28/2018 1715 Last data filed at 03/28/2018 1500 Gross per 24 hour  Intake 63.17 ml  Output 520 ml  Net -456.83 ml     Physical Exam  Gen:- Awake Alert,  In no apparent distress  HEENT:- [place].AT, No sclera icterus Neck-Supple Neck,No JVD,.  Lungs-  CTAB , fair air movement CV- S1, S2 normal Abd-  +ve B.Sounds, Abd Soft, No tenderness,    Extremity/Skin:- No  edema,   good pulses Psych-affect is appropriate, oriented x3 Neuro-no new focal deficits, no tremors   Data Review:   Micro Results No results found for this or any previous visit (from the past 240 hour(s)).  Radiology Reports Dg Chest 2 View  Result Date: 03/25/2018 CLINICAL DATA:  62 year old female with a history of dyspnea and headache EXAM: CHEST - 2 VIEW COMPARISON:  03/18/2018 FINDINGS: Cardiomediastinal silhouette unchanged in size and contour with surgical changes of prior CABG. Coarsened interstitial markings bilaterally. Lateral view demonstrates blunting of the costophrenic sulcus. Surgical changes of the right chest again demonstrated. No confluent airspace disease. No acute displaced fracture IMPRESSION: Chronic lung changes, with either scarring or small pleural effusion on the lateral view. No evidence of lobar pneumonia. Surgical changes of prior CABG. Electronically Signed   By: Gilmer Mor D.O.   On: 03/25/2018 19:45   Ct Head Wo Contrast  Result Date: 03/25/2018 CLINICAL DATA:  Mental status changes. Hypertension. Left-sided deficits from prior stroke. EXAM: CT HEAD WITHOUT CONTRAST TECHNIQUE: Contiguous axial images were obtained from the base of the skull through the vertex without intravenous contrast. COMPARISON:  Multiple exams, including  03/19/2018 FINDINGS: Brain: Right anterior middle cerebral artery distribution encephalomalacia with distribution of involvement similar to 03/19/2018. The brainstem and cerebellum appear unremarkable. Again observed is intraventricular hemorrhage filling most of the left lateral ventricle. There has been some clock contraction/reduction in overall volume, and the temporal horn of the left lateral ventricle is not dilated. Also the blood products in the third ventricle and in the right lateral ventricle appear to have intervally cleared. 6 mm of right-to-left midline shift, formerly 8 mm. Periventricular white matter and corona radiata hypodensities favor chronic ischemic microvascular white matter disease. No new hemorrhage is identified. Vascular: There is atherosclerotic calcification of the cavernous carotid arteries bilaterally. Skull: Unremarkable Sinuses/Orbits: Left mastoidectomy with small right mastoid effusion and fluid in the remaining left mastoid air cells. No middle ear fluid. Other: No  supplemental non-categorized findings. IMPRESSION: 1. Resolution of the prior blood products in the right lateral ventricle and third ventricle. Mild reduction in volume of the hyperdense blood products filling most of the left lateral ventricle. Reduction in left-to-right midline shift from previous 8 mm to current 6 mm. 2. Right anterior MCA distribution encephalomalacia compatible with remote stroke. 3. Periventricular white matter and corona radiata hypodensities favor chronic ischemic microvascular white matter disease. 4. Small right mastoid effusion.  Prior left mastoidectomy. Electronically Signed   By: Gaylyn Rong M.D.   On: 03/25/2018 19:33   Ct Head Wo Contrast  Result Date: 03/19/2018 CLINICAL DATA:  Altered mental status. Stroke with sudden slow responses EXAM: CT HEAD WITHOUT CONTRAST TECHNIQUE: Contiguous axial images were obtained from the base of the skull through the vertex without  intravenous contrast. COMPARISON:  Earlier today FINDINGS: Brain: Intraventricular hemorrhage in the left more than right lateral ventricles and in the third ventricle. Stable ventricular volume without temporal horn dilatation to suggest hydrocephalus. There is a degree of ventriculomegaly from volume loss. Remote right MCA distribution infarct affecting the insula and basal ganglia. Chronic small vessel ischemic type change in the cerebral white matter. Vascular: No hyperdense vessel or unexpected calcification. Skull: Negative.  Dermal inclusion cyst in the scalp. Sinuses/Orbits: Bilateral mastoid opacification with mastoidectomy on the left. IMPRESSION: 1. Unchanged intraventricular hemorrhage. Stable ventricular volume without hydrocephalus. 2. Remote right MCA branch infarct. Electronically Signed   By: Marnee Spring M.D.   On: 03/19/2018 19:01   Ct Head Wo Contrast  Result Date: 03/19/2018 CLINICAL DATA:  Follow up stroke. EXAM: CT HEAD WITHOUT CONTRAST TECHNIQUE: Contiguous axial images were obtained from the base of the skull through the vertex without intravenous contrast. COMPARISON:  CT HEAD March 18, 2018. FINDINGS: BRAIN: Similar intraventricular hemorrhage predominately within LEFT lateral ventricle. Moderate ventriculomegaly in the basis of parenchymal brain volume loss. No hydrocephalus. No intraparenchymal hemorrhage or acute large vascular territory infarct. Old RIGHT basal ganglia, frontotemporal infarcts. Ex vacuo dilatation RIGHT frontal horn of the lateral ventricle. Patchy supratentorial white matter hypodensities exclusively for mention abnormality. No abnormal extra-axial fluid collections. Basal cisterns are patent. VASCULAR: Mild calcific atherosclerosis of the carotid siphons. SKULL: No skull fracture. LEFT parietal scalp scarring. Numerous subcentimeter calcified scalp tricholemmal cysts. SINUSES/ORBITS: LEFT wall up mastoidectomy. Small amount of soft tissue within the surgical  bed. Small RIGHT mastoid effusion.The included ocular globes and orbital contents are non-suspicious. OTHER: None. IMPRESSION: 1. Similar intraventricular hemorrhage.  No hydrocephalus. 2. Old RIGHT MCA territory infarct. 3. Mild chronic small vessel ischemic changes. Electronically Signed   By: Awilda Metro M.D.   On: 03/19/2018 05:31   Ct Head Wo Contrast  Result Date: 03/18/2018 CLINICAL DATA:  Headache and lethargy. EXAM: CT HEAD WITHOUT CONTRAST TECHNIQUE: Contiguous axial images were obtained from the base of the skull through the vertex without intravenous contrast. COMPARISON:  March 18, 2015 FINDINGS: Brain: There is mild diffuse atrophy, stable. Hemorrhage fills the left lateral ventricle and much of the third lateral ventricle. A small amount of hemorrhage extends into the posterior aspect of the frontal horn of the right lateral ventricle. This hemorrhage is causing slight dilatation of the left lateral ventricle. No hemorrhage is evident outside of the ventricular system currently. There is 3 mm of midline shift toward the right. There is evidence of a prior infarct involving portions of the posterior right frontal and superior right temporal lobe with infarct involving much of the right lentiform nucleus as well  as the right claustrum, extreme capsule, and insular cortex. Elsewhere there is patchy small vessel disease in the centra semiovale bilaterally. No well-defined acute infarct evident. No subdural or epidural fluid collections are evident. No mass evident. There is stable prominence of the cavernous sinus region on the right compared to the left, stable from 2016. Vascular: There is no hyperdense vessel. There is calcification in each carotid siphon. Skull: Bony calvarium appears intact. Sinuses/Orbits: There is mucosal thickening in multiple ethmoid air cells. Orbits appear symmetric bilaterally. Other: Patient has had previous mastoidectomy on the left. There is opacification of the  remaining mastoid air cells on the left. There is opacification in multiple mastoid air cells on the right. There is a sebaceous cyst posterior to the midline occipital bones measuring 8 x 7 mm. IMPRESSION: Focal hemorrhage filling most of the left lateral and third ventricles with mild extension into the posterior aspect of the frontal horn of the right lateral ventricle. There is slight dilatation of the left lateral ventricle. There is 3 mm of midline shift toward the right. No hemorrhage is seen within the brain parenchyma on this study. There is atrophy. There is prior infarct involving portions of the posterior right frontal and superior anterior right temporal lobes as well as much of the right claustrum, extreme capsule, and insular cortex. Much of the right lentiform nucleus is involved with this infarct as well. There is patchy periventricular small vessel disease elsewhere without evident acute infarct. There are foci of arterial vascular calcification. There is mucosal thickening in multiple ethmoid air cells. There is extensive mastoid disease bilaterally with evidence of previous mastoidectomy on the left. Critical Value/emergent results were called by telephone at the time of interpretation on 03/18/2018 at 7:47 am to Dr. Bethann Berkshire , who verbally acknowledged these results. Electronically Signed   By: Bretta Bang III M.D.   On: 03/18/2018 07:47   Mr Maxine Glenn Head Wo Contrast  Result Date: 03/22/2018 CLINICAL DATA:  Intraventricular hemorrhage EXAM: MRA HEAD WITHOUT CONTRAST TECHNIQUE: Angiographic images of the Circle of Willis were obtained using MRA technique without intravenous contrast. COMPARISON:  Brain MRI 03/20/2018 FINDINGS: There is unchanged intraventricular blood, predominantly in the left lateral ventricle. The size and configuration of the ventricles are unchanged. ANTERIOR CIRCULATION: --Intracranial internal carotid arteries: Normal. --Anterior cerebral arteries: Normal variant  absent left A1 segment. Anterior communicating arteries patent. The A2 segments and beyond are normal. --Middle cerebral arteries: Normal. --Posterior communicating arteries: Present on the left only. POSTERIOR CIRCULATION: --Basilar artery: Normal. --Posterior cerebral arteries: Normal.  Fetal origin on the left. --Superior cerebellar arteries: Normal. --Inferior cerebellar arteries: Normal anterior and posterior inferior cerebellar arteries. IMPRESSION: 1. No aneurysm, large vessel occlusion or high-grade stenosis. 2. Unchanged distribution of intraventricular blood, predominantly located in the left lateral ventricle. Size and configuration of the ventricles are unchanged. Electronically Signed   By: Deatra Robinson M.D.   On: 03/22/2018 02:27   Mr Laqueta Jean XY Contrast  Result Date: 03/20/2018 CLINICAL DATA:  Headache since yesterday. Evaluate interventricular hemorrhage. History of stroke, atrial fibrillation, hypertension, hyperlipidemia. EXAM: MRI HEAD WITHOUT AND WITH CONTRAST TECHNIQUE: Multiplanar, multiecho pulse sequences of the brain and surrounding structures were obtained without and with intravenous contrast. CONTRAST:  84mL MULTIHANCE GADOBENATE DIMEGLUMINE 529 MG/ML IV SOLN COMPARISON:  CT HEAD March 19, 2018 and MRI of the head Jan 20, 2014. FINDINGS: INTRACRANIAL CONTENTS: Extensive blood products LEFT lateral ventricle with low T2 signal. Intermediate blood products layering in RIGHT occipital horn.  LEFT subependymal reduced diffusion most compatible with artifact. A few punctate RIGHT cerebrum white matter foci reduced diffusion without identifiable corresponding ADC abnormality. RIGHT frontotemporal and RIGHT basal ganglia encephalomalacia with susceptibility artifact and surrounding gliosis. Ex vacuo dilatation RIGHT lateral ventricle with resultant minimal LEFT-to-RIGHT midline shift. No hydrocephalus. Old small LEFT cerebellar infarct. RIGHT cerebral peduncle volume loss consistent with  wallerian degeneration. Patchy to confluent supratentorial pontine white matter FLAIR T2 hyperintensities. No abnormal extra-axial fluid collections. No abnormal intraparenchymal or extra-axial enhancement. VASCULAR: Normal major intracranial vascular flow voids present at skull base. SKULL AND UPPER CERVICAL SPINE: No abnormal sellar expansion. No suspicious calvarial bone marrow signal. Craniocervical junction maintained. Scattered small tricholemmal cysts. SINUSES/ORBITS: Status post LEFT wall up mastoidectomy. RIGHT mastoid effusion.The included ocular globes and orbital contents are non-suspicious. OTHER: None. IMPRESSION: 1. Intraventricular hemorrhage of varying ages.  No hydrocephalus. 2. Subcentimeter acute versus subacute RIGHT cerebrum infarcts, possibly artifact. 3. Old RIGHT MCA territory infarct. 4. Old small LEFT cerebellar infarct. Moderate chronic small vessel ischemic changes. Electronically Signed   By: Awilda Metro M.D.   On: 03/20/2018 06:15   Dg Chest Portable 1 View  Result Date: 03/18/2018 CLINICAL DATA:  Atrial fibrillation. EXAM: PORTABLE CHEST 1 VIEW COMPARISON:  Jan 08, 2017 FINDINGS: There is no edema or consolidation. Heart is upper normal in size with pulmonary vascularity normal. Patient is status post coronary artery bypass grafting. There are surgical clips over the lateral upper right hemithorax. There is aortic atherosclerosis. No evident bone lesions. IMPRESSION: No edema or consolidation. Stable cardiac silhouette. Status post coronary artery bypass grafting. There is aortic atherosclerosis. Aortic Atherosclerosis (ICD10-I70.0). Electronically Signed   By: Bretta Bang III M.D.   On: 03/18/2018 07:14     CBC Recent Labs  Lab 03/23/18 0801 03/24/18 0520 03/25/18 1818 03/26/18 0416 03/27/18 0123  WBC 5.9 7.6 9.8 11.0* 13.3*  HGB 12.2 11.3* 13.6 13.1 13.7  HCT 36.8 33.9* 39.9 38.3 39.6  PLT 193 197 225 211 251  MCV 102.2* 100.9* 99.8 99.0 98.0  MCH 33.9  33.6 34.0 33.9 33.9  MCHC 33.2 33.3 34.1 34.2 34.6  RDW 13.2 13.2 12.8 12.6 12.7    Chemistries  Recent Labs  Lab 03/23/18 1641 03/24/18 0520 03/24/18 1652 03/25/18 1818 03/26/18 0416 03/27/18 0123 03/28/18 0228  NA  --  137  --  135 131* 130* 130*  K 3.6 3.7 3.7 3.7 3.3* 3.4* 3.4*  CL  --  106  --  100 96* 98 97*  CO2  --  24  --  24 22 20* 23  GLUCOSE  --  100*  --  100* 115* 112* 94  BUN  --  8  --  7 7 15 19   CREATININE  --  1.15*  --  1.00 0.91 0.99 0.91  CALCIUM  --  8.3*  --  9.2 8.9 9.1 9.1  MG 1.1*  --   --   --  1.1* 1.3* 1.4*  AST  --   --   --  80*  --   --   --   ALT  --   --   --  15  --   --   --   ALKPHOS  --   --   --  64  --   --   --   BILITOT  --   --   --  1.0  --   --   --    ------------------------------------------------------------------------------------------------------------------ No results for  input(s): CHOL, HDL, LDLCALC, TRIG, CHOLHDL, LDLDIRECT in the last 72 hours.  Lab Results  Component Value Date   HGBA1C 5.1 03/21/2018   ------------------------------------------------------------------------------------------------------------------ No results for input(s): TSH, T4TOTAL, T3FREE, THYROIDAB in the last 72 hours.  Invalid input(s): FREET3 ------------------------------------------------------------------------------------------------------------------ No results for input(s): VITAMINB12, FOLATE, FERRITIN, TIBC, IRON, RETICCTPCT in the last 72 hours.  Coagulation profile No results for input(s): INR, PROTIME in the last 168 hours.  No results for input(s): DDIMER in the last 72 hours.  Cardiac Enzymes Recent Labs  Lab 03/26/18 1252 03/26/18 1916 03/27/18 0123  TROPONINI 12.11* 12.40* 9.54*   ------------------------------------------------------------------------------------------------------------------    Component Value Date/Time   BNP 608.8 (H) 03/25/2018 2016     Shon Hale M.D on 03/28/2018 at 5:15  PM   Go to [email or web address] - password [email or web address] for contact info  [facility] - Office  [phone]

## 2018-03-28 NOTE — Progress Notes (Signed)
Subjective:  Currently sleeping.  Arouses briefly no complaints of chest pain or shortness of breath  Objective:  Vital Signs in the last 24 hours: BP (!) 134/96 (BP Location: Left Arm)   Pulse 83   Temp 97.9 F (36.6 C) (Oral)   Resp (!) 21   Ht 5\' 4"  (1.626 m)   Wt 75.1 kg (165 lb 9.1 oz)   SpO2 97%   BMI 28.42 kg/m   Physical Exam: Mildly obese white female sleeping in no acute distress Lungs:  Clear  Cardiac:  irregular rhythm, normal S1 and S2, no S3 Extremities:  No edema present  Intake/Output from previous day: 07/19 0701 - 07/20 0700 In: 426.6 [P.O.:370; I.V.:56.6] Out: 430 [Urine:430] Weight Filed Weights   03/26/18 0500 03/27/18 0500 03/28/18 0555  Weight: 75.8 kg (167 lb 1.7 oz) 77 kg (169 lb 12.1 oz) 75.1 kg (165 lb 9.1 oz)    Lab Results: Basic Metabolic Panel: Recent Labs    03/27/18 0123 03/28/18 0228  NA 130* 130*  K 3.4* 3.4*  CL 98 97*  CO2 20* 23  GLUCOSE 112* 94  BUN 15 19  CREATININE 0.99 0.91    CBC: Recent Labs    03/26/18 0416 03/27/18 0123  WBC 11.0* 13.3*  HGB 13.1 13.7  HCT 38.3 39.6  MCV 99.0 98.0  PLT 211 251    BNP    Component Value Date/Time   BNP 608.8 (H) 03/25/2018 2016    Cardiac Panel (last 3 results) Recent Labs    03/26/18 1252 03/26/18 1916 03/27/18 0123  TROPONINI 12.11* 12.40* 9.54*    PROTIME: Lab Results  Component Value Date   INR 1.4 (H) 09/13/2016   INR 1.2 (H) 11/08/2014   INR 1.20 10/11/2014    Telemetry: Atrial fibrillation with controlled response  Assessment/Plan:  1.  Atrial fibrillation response now controlled-off of anticoagulation because of intraventricular hemorrhage 2.  CAD with previous bypass grafting 3.  Recent interventricular hemorrhage  Recommendations:  Rate is currently controlled and anticoagulation is on hold because of intraventricular hemorrhage.no overt cardiac issues at this time.   Darden Palmer  MD [facility]  03/28/2018, 8:37 AM

## 2018-03-29 NOTE — Progress Notes (Signed)
Subjective:  Awake and says that she does not feel well this morning.  Complains of headache as well as back pain.  No complaints of shortness of breath or chest pain.  Objective:  Vital Signs in the last 24 hours: BP (!) 155/100   Pulse 60   Temp 98 F (36.7 C) (Oral)   Resp (!) 27   Ht 5\' 4"  (1.626 m)   Wt 73.9 kg (162 lb 14.7 oz)   SpO2 97%   BMI 27.97 kg/m   Physical Exam: Mildly obese white female complains of mild back pain  lungs:  Clear  Cardiac:  irregular rhythm, normal S1 and S2, no S3 Extremities:  No edema present  Intake/Output from previous day: 07/20 0701 - 07/21 0700 In: 63.2 [IV Piggyback:63.2] Out: 500 [Urine:500] Weight Filed Weights   03/27/18 0500 03/28/18 0555 03/29/18 0500  Weight: 77 kg (169 lb 12.1 oz) 75.1 kg (165 lb 9.1 oz) 73.9 kg (162 lb 14.7 oz)    Lab Results: Basic Metabolic Panel: Recent Labs    03/27/18 0123 03/28/18 0228  NA 130* 130*  K 3.4* 3.4*  CL 98 97*  CO2 20* 23  GLUCOSE 112* 94  BUN 15 19  CREATININE 0.99 0.91    CBC: Recent Labs    03/27/18 0123  WBC 13.3*  HGB 13.7  HCT 39.6  MCV 98.0  PLT 251    BNP    Component Value Date/Time   BNP 608.8 (H) 03/25/2018 2016    Cardiac Panel (last 3 results) Recent Labs    03/26/18 1252 03/26/18 1916 03/27/18 0123  TROPONINI 12.11* 12.40* 9.54*    PROTIME: Lab Results  Component Value Date   INR 1.4 (H) 09/13/2016   INR 1.2 (H) 11/08/2014   INR 1.20 10/11/2014    Telemetry: Atrial fibrillation with controlled response  Assessment/Plan:  1.  Atrial fibrillation response now controlled-off of anticoagulation because of intraventricular hemorrhage-do not want to use amiodarone because we do not want to try to cardiovert her without anticoagulation. 2.  CAD with previous bypass grafting 3.  Recent interventricular hemorrhage  Recommendations:  Major issues are management of her back pain and headache.  No overt cardiac issues at this time.   Darden Palmer  MD [facility]  03/29/2018, 8:25 AM

## 2018-03-29 NOTE — Progress Notes (Signed)
Patient Demographics:    [name], is a 62 y.o. female, DOB - [birth year], ZOX:[id number]  Admit date - 03/25/2018   Admitting Physician Carin Hock, MD  Outpatient Primary MD for the patient is Panosh, Neta Mends, MD  LOS - 4   Chief Complaint  Patient presents with  . Headache        Subjective:    [name] today has no fevers, no emesis,  No chest pain, c/o headaches and back pain, no visual disturbance  Assessment  & Plan :    Active Problems:   H/O: CVA (cerebrovascular accident)   Hypertensive emergency  Brief summary 62 y.o. female with a hx of CAD s/p CABG and subsequent stenting of the native LAD, PAF, chronic combined systolic and diastolic heart failure (EF 40% in 2015 >>improved to 65-70% in 3/16), hypertension, CKD, OSA intolerant to CPAP, CVA in 2012, COPD, bradycardia, tobacco use and recent intraventricular hemorrhage (03/18/18) , , readmitted from SNF to PCCM service on 03/25/2018 with headaches and elevated blood pressure and concerns about ICH, repeat CT head did not show any new brain bleed, transferred from [facility] service to hospitalist service on 03/28/2018   Plan:- 1)HTN-BP is labile,  patient is off Cleviprex drip, BP is more stable, continue clonidine 0.1 mg twice daily, Cardizem 120 mg twice daily, lisinopril 20 mg twice daily, metoprolol 25 mg twice daily, okay to use PRN IV hydralazine for elevated BP  2) H/o Prior RCA MCA infarct and Recent ICH/IVH-  patient had intraventricular hemorrhage (03/18/18) while on Xarelto and Plavix for stroke and CAD, neurosurgery advised conservative non-operative approach, repeat CT head in 3 to 4 weeks advised, if negative consider restarting Eliquis alone without antiplatelet agents, patient was readmitted 03/25/2018 with elevated blood pressure and headaches, repeat CT head on 03/25/2018 without acute bleed   3)CAD-post prior CABG and  status post prior stenting stable, no ACS type symptoms, continue aspirin 81 mg daily, along with metoprolol 25 mill grams twice daily and Lipitor  4)PAfib--rate controlled better with p.o. Cardizem and p.o. metoprolol, heart rate is labile, consider decreasing Cardizem if bradycardia persists, not on anticoagulation due to recent intraventricular hemorrhage, avoid amiodarone in order to avoid inducing conversion to sinus rhythm which may increase stroke risk as patient cannot be anticoagulated\  5)COPD--stable, bronchodilators as needed  6)HAs--Topamax for headache prophylaxis, Fioricet as needed for headache  7)Mood Disorder-- Taper off Wellbutrin given history of strokes and intracranial hemorrhage patient is at increased risk for seizures  8)Hypothyroidism--stable, continue levothyroxine  Code Status : Full code  Disposition Plan  : SNF  Consults  : Cardiology/ PCCM   DVT Prophylaxis  :   SCDs (recent ICH)  Lab Results  Component Value Date   PLT 251 03/27/2018    Inpatient Medications  Scheduled Meds: . aspirin EC  81 mg Oral Daily  . atorvastatin  40 mg Oral q1800  . buPROPion  150 mg Oral q morning - 10a  . cloNIDine  0.1 mg Oral BID  . diltiazem  120 mg Oral Q8H  . escitalopram  10 mg Oral Daily  . famotidine  20 mg Oral Daily  . fluticasone  2 spray Each Nare Daily  . levothyroxine  200 mcg Oral  QAC breakfast  . lisinopril  20 mg Oral BID  . magnesium oxide  400 mg Oral Daily  . metoprolol tartrate  25 mg Oral BID  . topiramate  50 mg Oral BID  . cyanocobalamin  1,000 mcg Oral Daily   Continuous Infusions: . sodium chloride Stopped (03/26/18 1430)   PRN Meds:.sodium chloride, butalbital-acetaminophen-caffeine, hydrALAZINE    Anti-infectives (From admission, onward)   None        Objective:   Vitals:   03/29/18 1241 03/29/18 1400 03/29/18 1528 03/29/18 1600  BP: (!) 141/90 112/71 (!) 133/99 (!) 147/102  [name]: 87 (!) 101 72 (!) 46  Resp: (!) 23 18  (!) 26 (!) 26  Temp: 98 F (36.7 C)  98 F (36.7 C) 99 F (37.2 C)  TempSrc: Oral  Oral Oral  SpO2: 98% 100% 97% 96%  Weight:      Height:        Wt Readings from Last 3 Encounters:  03/29/18 73.9 kg (162 lb 14.7 oz)  03/18/18 77.7 kg (171 lb 4.8 oz)  03/22/18 77.6 kg (171 lb)     Intake/Output Summary (Last 24 hours) at 03/29/2018 1713 Last data filed at 03/29/2018 1400 Gross per 24 hour  Intake 180 ml  Output 425 ml  Net -245 ml     Physical Exam  Gen:- Awake Alert,  In no apparent distress  HEENT:- [place].AT, No sclera icterus Neck-Supple Neck,No JVD,.  Lungs-  CTAB , fair air movement CV- S1, S2 normal Abd-  +ve B.Sounds, Abd Soft, No tenderness,    Extremity/Skin:- No  edema,   good pulses Psych-affect is appropriate, oriented x3 Neuro-no new focal deficits, no tremors   Data Review:   Micro Results No results found for this or any previous visit (from the past 240 hour(s)).  Radiology Reports Dg Chest 2 View  Result Date: 03/25/2018 CLINICAL DATA:  62 year old female with a history of dyspnea and headache EXAM: CHEST - 2 VIEW COMPARISON:  03/18/2018 FINDINGS: Cardiomediastinal silhouette unchanged in size and contour with surgical changes of prior CABG. Coarsened interstitial markings bilaterally. Lateral view demonstrates blunting of the costophrenic sulcus. Surgical changes of the right chest again demonstrated. No confluent airspace disease. No acute displaced fracture IMPRESSION: Chronic lung changes, with either scarring or small pleural effusion on the lateral view. No evidence of lobar pneumonia. Surgical changes of prior CABG. Electronically Signed   By: Gilmer Mor D.O.   On: 03/25/2018 19:45   Ct Head Wo Contrast  Result Date: 03/25/2018 CLINICAL DATA:  Mental status changes. Hypertension. Left-sided deficits from prior stroke. EXAM: CT HEAD WITHOUT CONTRAST TECHNIQUE: Contiguous axial images were obtained from the base of the skull through the vertex  without intravenous contrast. COMPARISON:  Multiple exams, including 03/19/2018 FINDINGS: Brain: Right anterior middle cerebral artery distribution encephalomalacia with distribution of involvement similar to 03/19/2018. The brainstem and cerebellum appear unremarkable. Again observed is intraventricular hemorrhage filling most of the left lateral ventricle. There has been some clock contraction/reduction in overall volume, and the temporal horn of the left lateral ventricle is not dilated. Also the blood products in the third ventricle and in the right lateral ventricle appear to have intervally cleared. 6 mm of right-to-left midline shift, formerly 8 mm. Periventricular white matter and corona radiata hypodensities favor chronic ischemic microvascular white matter disease. No new hemorrhage is identified. Vascular: There is atherosclerotic calcification of the cavernous carotid arteries bilaterally. Skull: Unremarkable Sinuses/Orbits: Left mastoidectomy with small right mastoid effusion and fluid  in the remaining left mastoid air cells. No middle ear fluid. Other: No supplemental non-categorized findings. IMPRESSION: 1. Resolution of the prior blood products in the right lateral ventricle and third ventricle. Mild reduction in volume of the hyperdense blood products filling most of the left lateral ventricle. Reduction in left-to-right midline shift from previous 8 mm to current 6 mm. 2. Right anterior MCA distribution encephalomalacia compatible with remote stroke. 3. Periventricular white matter and corona radiata hypodensities favor chronic ischemic microvascular white matter disease. 4. Small right mastoid effusion.  Prior left mastoidectomy. Electronically Signed   By: Gaylyn Rong M.D.   On: 03/25/2018 19:33   Ct Head Wo Contrast  Result Date: 03/19/2018 CLINICAL DATA:  Altered mental status. Stroke with sudden slow responses EXAM: CT HEAD WITHOUT CONTRAST TECHNIQUE: Contiguous axial images were  obtained from the base of the skull through the vertex without intravenous contrast. COMPARISON:  Earlier today FINDINGS: Brain: Intraventricular hemorrhage in the left more than right lateral ventricles and in the third ventricle. Stable ventricular volume without temporal horn dilatation to suggest hydrocephalus. There is a degree of ventriculomegaly from volume loss. Remote right MCA distribution infarct affecting the insula and basal ganglia. Chronic small vessel ischemic type change in the cerebral white matter. Vascular: No hyperdense vessel or unexpected calcification. Skull: Negative.  Dermal inclusion cyst in the scalp. Sinuses/Orbits: Bilateral mastoid opacification with mastoidectomy on the left. IMPRESSION: 1. Unchanged intraventricular hemorrhage. Stable ventricular volume without hydrocephalus. 2. Remote right MCA branch infarct. Electronically Signed   By: Marnee Spring M.D.   On: 03/19/2018 19:01   Ct Head Wo Contrast  Result Date: 03/19/2018 CLINICAL DATA:  Follow up stroke. EXAM: CT HEAD WITHOUT CONTRAST TECHNIQUE: Contiguous axial images were obtained from the base of the skull through the vertex without intravenous contrast. COMPARISON:  CT HEAD March 18, 2018. FINDINGS: BRAIN: Similar intraventricular hemorrhage predominately within LEFT lateral ventricle. Moderate ventriculomegaly in the basis of parenchymal brain volume loss. No hydrocephalus. No intraparenchymal hemorrhage or acute large vascular territory infarct. Old RIGHT basal ganglia, frontotemporal infarcts. Ex vacuo dilatation RIGHT frontal horn of the lateral ventricle. Patchy supratentorial white matter hypodensities exclusively for mention abnormality. No abnormal extra-axial fluid collections. Basal cisterns are patent. VASCULAR: Mild calcific atherosclerosis of the carotid siphons. SKULL: No skull fracture. LEFT parietal scalp scarring. Numerous subcentimeter calcified scalp tricholemmal cysts. SINUSES/ORBITS: LEFT wall up  mastoidectomy. Small amount of soft tissue within the surgical bed. Small RIGHT mastoid effusion.The included ocular globes and orbital contents are non-suspicious. OTHER: None. IMPRESSION: 1. Similar intraventricular hemorrhage.  No hydrocephalus. 2. Old RIGHT MCA territory infarct. 3. Mild chronic small vessel ischemic changes. Electronically Signed   By: Awilda Metro M.D.   On: 03/19/2018 05:31   Ct Head Wo Contrast  Result Date: 03/18/2018 CLINICAL DATA:  Headache and lethargy. EXAM: CT HEAD WITHOUT CONTRAST TECHNIQUE: Contiguous axial images were obtained from the base of the skull through the vertex without intravenous contrast. COMPARISON:  March 18, 2015 FINDINGS: Brain: There is mild diffuse atrophy, stable. Hemorrhage fills the left lateral ventricle and much of the third lateral ventricle. A small amount of hemorrhage extends into the posterior aspect of the frontal horn of the right lateral ventricle. This hemorrhage is causing slight dilatation of the left lateral ventricle. No hemorrhage is evident outside of the ventricular system currently. There is 3 mm of midline shift toward the right. There is evidence of a prior infarct involving portions of the posterior right frontal and superior right  temporal lobe with infarct involving much of the right lentiform nucleus as well as the right claustrum, extreme capsule, and insular cortex. Elsewhere there is patchy small vessel disease in the centra semiovale bilaterally. No well-defined acute infarct evident. No subdural or epidural fluid collections are evident. No mass evident. There is stable prominence of the cavernous sinus region on the right compared to the left, stable from 2016. Vascular: There is no hyperdense vessel. There is calcification in each carotid siphon. Skull: Bony calvarium appears intact. Sinuses/Orbits: There is mucosal thickening in multiple ethmoid air cells. Orbits appear symmetric bilaterally. Other: Patient has had  previous mastoidectomy on the left. There is opacification of the remaining mastoid air cells on the left. There is opacification in multiple mastoid air cells on the right. There is a sebaceous cyst posterior to the midline occipital bones measuring 8 x 7 mm. IMPRESSION: Focal hemorrhage filling most of the left lateral and third ventricles with mild extension into the posterior aspect of the frontal horn of the right lateral ventricle. There is slight dilatation of the left lateral ventricle. There is 3 mm of midline shift toward the right. No hemorrhage is seen within the brain parenchyma on this study. There is atrophy. There is prior infarct involving portions of the posterior right frontal and superior anterior right temporal lobes as well as much of the right claustrum, extreme capsule, and insular cortex. Much of the right lentiform nucleus is involved with this infarct as well. There is patchy periventricular small vessel disease elsewhere without evident acute infarct. There are foci of arterial vascular calcification. There is mucosal thickening in multiple ethmoid air cells. There is extensive mastoid disease bilaterally with evidence of previous mastoidectomy on the left. Critical Value/emergent results were called by telephone at the time of interpretation on 03/18/2018 at 7:47 am to Dr. Bethann Berkshire , who verbally acknowledged these results. Electronically Signed   By: Bretta Bang III M.D.   On: 03/18/2018 07:47   Mr Maxine Glenn Head Wo Contrast  Result Date: 03/22/2018 CLINICAL DATA:  Intraventricular hemorrhage EXAM: MRA HEAD WITHOUT CONTRAST TECHNIQUE: Angiographic images of the Circle of Willis were obtained using MRA technique without intravenous contrast. COMPARISON:  Brain MRI 03/20/2018 FINDINGS: There is unchanged intraventricular blood, predominantly in the left lateral ventricle. The size and configuration of the ventricles are unchanged. ANTERIOR CIRCULATION: --Intracranial internal  carotid arteries: Normal. --Anterior cerebral arteries: Normal variant absent left A1 segment. Anterior communicating arteries patent. The A2 segments and beyond are normal. --Middle cerebral arteries: Normal. --Posterior communicating arteries: Present on the left only. POSTERIOR CIRCULATION: --Basilar artery: Normal. --Posterior cerebral arteries: Normal.  Fetal origin on the left. --Superior cerebellar arteries: Normal. --Inferior cerebellar arteries: Normal anterior and posterior inferior cerebellar arteries. IMPRESSION: 1. No aneurysm, large vessel occlusion or high-grade stenosis. 2. Unchanged distribution of intraventricular blood, predominantly located in the left lateral ventricle. Size and configuration of the ventricles are unchanged. Electronically Signed   By: Deatra Robinson M.D.   On: 03/22/2018 02:27   Mr Laqueta Jean UJ Contrast  Result Date: 03/20/2018 CLINICAL DATA:  Headache since yesterday. Evaluate interventricular hemorrhage. History of stroke, atrial fibrillation, hypertension, hyperlipidemia. EXAM: MRI HEAD WITHOUT AND WITH CONTRAST TECHNIQUE: Multiplanar, multiecho [name] sequences of the brain and surrounding structures were obtained without and with intravenous contrast. CONTRAST:  15mL MULTIHANCE GADOBENATE DIMEGLUMINE 529 MG/ML IV SOLN COMPARISON:  CT HEAD March 19, 2018 and MRI of the head Jan 20, 2014. FINDINGS: INTRACRANIAL CONTENTS: Extensive blood products LEFT lateral  ventricle with low T2 signal. Intermediate blood products layering in RIGHT occipital horn. LEFT subependymal reduced diffusion most compatible with artifact. A few punctate RIGHT cerebrum white matter foci reduced diffusion without identifiable corresponding ADC abnormality. RIGHT frontotemporal and RIGHT basal ganglia encephalomalacia with susceptibility artifact and surrounding gliosis. Ex vacuo dilatation RIGHT lateral ventricle with resultant minimal LEFT-to-RIGHT midline shift. No hydrocephalus. Old small LEFT  cerebellar infarct. RIGHT cerebral peduncle volume loss consistent with wallerian degeneration. Patchy to confluent supratentorial pontine white matter FLAIR T2 hyperintensities. No abnormal extra-axial fluid collections. No abnormal intraparenchymal or extra-axial enhancement. VASCULAR: Normal major intracranial vascular flow voids present at skull base. SKULL AND UPPER CERVICAL SPINE: No abnormal sellar expansion. No suspicious calvarial bone marrow signal. Craniocervical junction maintained. Scattered small tricholemmal cysts. SINUSES/ORBITS: Status post LEFT wall up mastoidectomy. RIGHT mastoid effusion.The included ocular globes and orbital contents are non-suspicious. OTHER: None. IMPRESSION: 1. Intraventricular hemorrhage of varying ages.  No hydrocephalus. 2. Subcentimeter acute versus subacute RIGHT cerebrum infarcts, possibly artifact. 3. Old RIGHT MCA territory infarct. 4. Old small LEFT cerebellar infarct. Moderate chronic small vessel ischemic changes. Electronically Signed   By: Awilda Metro M.D.   On: 03/20/2018 06:15   Dg Chest Portable 1 View  Result Date: 03/18/2018 CLINICAL DATA:  Atrial fibrillation. EXAM: PORTABLE CHEST 1 VIEW COMPARISON:  Jan 08, 2017 FINDINGS: There is no edema or consolidation. Heart is upper normal in size with pulmonary vascularity normal. Patient is status post coronary artery bypass grafting. There are surgical clips over the lateral upper right hemithorax. There is aortic atherosclerosis. No evident bone lesions. IMPRESSION: No edema or consolidation. Stable cardiac silhouette. Status post coronary artery bypass grafting. There is aortic atherosclerosis. Aortic Atherosclerosis (ICD10-I70.0). Electronically Signed   By: Bretta Bang III M.D.   On: 03/18/2018 07:14     CBC Recent Labs  Lab 03/23/18 0801 03/24/18 0520 03/25/18 1818 03/26/18 0416 03/27/18 0123  WBC 5.9 7.6 9.8 11.0* 13.3*  HGB 12.2 11.3* 13.6 13.1 13.7  HCT 36.8 33.9* 39.9 38.3 39.6   PLT 193 197 225 211 251  MCV 102.2* 100.9* 99.8 99.0 98.0  MCH 33.9 33.6 34.0 33.9 33.9  MCHC 33.2 33.3 34.1 34.2 34.6  RDW 13.2 13.2 12.8 12.6 12.7    Chemistries  Recent Labs  Lab 03/23/18 1641 03/24/18 0520 03/24/18 1652 03/25/18 1818 03/26/18 0416 03/27/18 0123 03/28/18 0228  NA  --  137  --  135 131* 130* 130*  K 3.6 3.7 3.7 3.7 3.3* 3.4* 3.4*  CL  --  106  --  100 96* 98 97*  CO2  --  24  --  24 22 20* 23  GLUCOSE  --  100*  --  100* 115* 112* 94  BUN  --  8  --  7 7 15 19   CREATININE  --  1.15*  --  1.00 0.91 0.99 0.91  CALCIUM  --  8.3*  --  9.2 8.9 9.1 9.1  MG 1.1*  --   --   --  1.1* 1.3* 1.4*  AST  --   --   --  80*  --   --   --   ALT  --   --   --  15  --   --   --   ALKPHOS  --   --   --  64  --   --   --   BILITOT  --   --   --  1.0  --   --   --    ------------------------------------------------------------------------------------------------------------------  No results for input(s): CHOL, HDL, LDLCALC, TRIG, CHOLHDL, LDLDIRECT in the last 72 hours.  Lab Results  Component Value Date   HGBA1C 5.1 03/21/2018   ------------------------------------------------------------------------------------------------------------------ No results for input(s): TSH, T4TOTAL, T3FREE, THYROIDAB in the last 72 hours.  Invalid input(s): FREET3 ------------------------------------------------------------------------------------------------------------------ No results for input(s): VITAMINB12, FOLATE, FERRITIN, TIBC, IRON, RETICCTPCT in the last 72 hours.  Coagulation profile No results for input(s): INR, PROTIME in the last 168 hours.  No results for input(s): DDIMER in the last 72 hours.  Cardiac Enzymes Recent Labs  Lab 03/26/18 1252 03/26/18 1916 03/27/18 0123  TROPONINI 12.11* 12.40* 9.54*   ------------------------------------------------------------------------------------------------------------------    Component Value Date/Time   BNP 608.8 (H)  03/25/2018 2016     Shon Hale M.D on 03/29/2018 at 5:13 PM   Go to [email or web address] - password [email or web address] for contact info  [facility] - Office  [phone]

## 2018-03-29 NOTE — Clinical Social Work Note (Signed)
Clinical Social Work Assessment  Patient Details  Name: [name] MRN: [id number] Date of Birth: May 06, 1956  Date of referral:  03/29/18               Reason for consult:  Facility Placement                Permission sought to share information with:  Family Supports Permission granted to share information::  Yes, Release of Information Signed  Name::     [name]  Agency::  [facility]  Relationship::  Son  Solicitor Information:     Housing/Transportation Living arrangements for the past 2 months:  Skilled Building surveyor of Information:  Adult Children Patient Interpreter Needed:  None Criminal Activity/Legal Involvement Pertinent to Current Situation/Hospitalization:  No - Comment as needed Significant Relationships:  Adult Children Lives with:  Self Do you feel safe going back to the place where you live?  No Need for family participation in patient care:  No (Coment)  Care giving concerns:  Pt is only alert to self and place. CSW spoke with pt's son via telephone.   Social Worker assessment / plan:  CSW spoke with pt's son via telephone. Pt was recently d/c on 7/16 and was readmitted on 7/17 with high blood pressure. Pt was at [facility] after previous to this admission. Pt's son confirmed the plan is for pt to return after d/c. CSW to follow up with facility.  Employment status:  Disabled (Comment on whether or not currently receiving Disability) Insurance information:  Managed Medicare PT Recommendations:  Skilled Nursing Facility Information / Referral to community resources:  Skilled Nursing Facility  Patient/Family's Response to care:  Pt's son verbalized understanding of CSW role and expressed appreciation for support. Pt's son denies any concern regarding pt care at this time.   Patient/Family's Understanding of and Emotional Response to Diagnosis, Current Treatment, and Prognosis:  Pt's son understanding and realistic regarding pt's  physical limitations. Pt's son understands the need for SNF placement for pt at d/--Pt's son agreeable for pt to go to SNF at d/c, at this time. Pt's son denies any concern regarding pt's treatment plan at this time. CSW will continue to provide support and facilitate d/c needs.   Emotional Assessment Appearance:  Appears stated age Attitude/Demeanor/Rapport:  Unable to Assess Affect (typically observed):  Unable to Assess Orientation:  Oriented to Place, Oriented to Self Alcohol / Substance use:  Not Applicable Psych involvement (Current and /or in the community):  No (Comment)  Discharge Needs  Concerns to be addressed:  Basic Needs, Care Coordination Readmission within the last 30 days:  Yes Current discharge risk:  Dependent with Mobility Barriers to Discharge:  Continued Medical Work up   [employer], LCSW 03/29/2018, 2:17 PM

## 2018-03-29 NOTE — [place] FL2 (Signed)
[place] MEDICAID FL2 LEVEL OF CARE SCREENING TOOL     IDENTIFICATION  Patient Name: [name] Birthdate: 1955-11-17 Sex: female Admission Date (Current Location): 03/25/2018  [facility] and [place] Number:  Producer, television/film/video and Address:  [facility] [place], [address] [place], [place] [address]      Provider Number: [id number]  Attending Physician Name and Address:  Shon Hale, MD  Relative Name and Phone Number:  [name], son, [phone]    Current Level of Care: Hospital Recommended Level of Care: Skilled Nursing Facility Prior Approval Number:    Date Approved/Denied:   PASRR Number:    Discharge Plan: SNF    Current Diagnoses: Patient Active Problem List   Diagnosis Date Noted  . Hypertensive emergency 03/25/2018  . NSTEMI (non-ST elevated myocardial infarction) (HCC)   . Troponin level elevated   . IVH (intraventricular hemorrhage) (HCC) 03/19/2018  . 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 Long term current use of amiodarone 03/27/2017  . Severe recurrent major depression without psychotic features (HCC) 11/19/2016    Class: Chronic  . Pre-procedure lab exam 09/13/2016  . Generalized headaches 07/04/2016  . Protein-calorie malnutrition, severe (HCC) 04/03/2015  . Chronic diastolic CHF (congestive heart failure), NYHA class 2 (HCC) 01/24/2015  . Demand ischemia secondary to AF with RVR 12/13/2014  . CKD (chronic kidney disease), stage III (HCC) 11/11/2014  . Hypokalemia 05/22/2014  . Mood disorder (HCC) 03/20/2014  . HTN (hypertension) 03/20/2014  . Anemia, unspecified 03/20/2014  . Smoker 03/20/2014  . Atrial fibrillation with RVR (HCC) 03/03/2014  . Hypotension 03/03/2014  . Pulmonary hypertension (HCC) 03/03/2014  . COPD (chronic obstructive pulmonary disease) (HCC) 03/03/2014  . H/O: CVA (cerebrovascular accident)   . Long-term (current) use of anticoagulants   . Cardiomyopathy-h/o tachycardia mediated-EF 65% per  echo March 2016 02/18/2014  . Hyperlipidemia 02/18/2014  . CAD '07, LAD PCI 2012, SVG-PDA PTCA 11/10/14 02/16/2014  . Chronic diastolic heart failure (HCC)   . Pulmonary nodules   . Hypothyroidism   . OSA- C-pap intol     Orientation RESPIRATION BLADDER Height & Weight     Self, Place  Normal Continent, Indwelling catheter Weight: 162 lb 14.7 oz (73.9 kg) Height:  5\' 4"  (162.6 cm)  BEHAVIORAL SYMPTOMS/MOOD NEUROLOGICAL BOWEL NUTRITION STATUS      Continent Diet(REgular diet, thin liquids)  AMBULATORY STATUS COMMUNICATION OF NEEDS Skin   Extensive Assist Verbally Normal                       Personal Care Assistance Level of Assistance  Bathing, Feeding, Dressing Bathing Assistance: Maximum assistance Feeding assistance: Limited assistance Dressing Assistance: Maximum assistance     Functional Limitations Info  Speech, Hearing, Sight Sight Info: Adequate Hearing Info: Adequate Speech Info: Adequate    SPECIAL CARE FACTORS FREQUENCY  PT (By licensed PT), OT (By licensed OT)     PT Frequency: 5x OT Frequency: 5x            Contractures Contractures Info: Not present    Additional Factors Info  Code Status, Allergies, Psychotropic Code Status Info: Full Code Allergies Info: elox Moxifloxacin Hcl In Nacl, Pamelor Nortriptyline Hcl, Amoxicillin, Hydrocodone, Oxycodone, Penicillins, Sulfa Antibiotics           Current Medications (03/29/2018):  This is the current hospital active medication list Current Facility-Administered Medications  Medication Dose Route Frequency Provider Last Rate Last Dose  . 0.9 %  sodium chloride infusion  250 mL Intravenous PRN Celine Mans, Rahul P, PA-C   Stopped at 03/26/18 1430  . aspirin EC tablet 81 mg  81 mg Oral Daily Desai, Rahul P, PA-C   81 mg at 03/29/18 0928  . atorvastatin (LIPITOR) tablet 40 mg  40 mg Oral q1800 Desai, Rahul P, PA-C   40 mg at 03/28/18 1825  . buPROPion (WELLBUTRIN XL) 24 hr tablet 150 mg  150 mg Oral q  morning - 10a Emokpae, Courage, MD   150 mg at 03/29/18 0928  . butalbital-acetaminophen-caffeine (FIORICET, ESGIC) 50-325-40 MG per tablet 1 tablet  1 tablet Oral Q8H PRN Celine Mans, Rahul P, PA-C   1 tablet at 03/29/18 1409  . cloNIDine (CATAPRES) tablet 0.1 mg  0.1 mg Oral BID Leslye Peer, MD   0.1 mg at 03/29/18 1324  . diltiazem (CARDIZEM SR) 12 hr capsule 120 mg  120 mg Oral Q8H Desai, Rahul P, PA-C   120 mg at 03/29/18 1409  . escitalopram (LEXAPRO) tablet 10 mg  10 mg Oral Daily Desai, Rahul P, PA-C   10 mg at 03/29/18 4010  . famotidine (PEPCID) tablet 20 mg  20 mg Oral Daily Desai, Rahul P, PA-C   20 mg at 03/29/18 0928  . fluticasone (FLONASE) 50 MCG/ACT nasal spray 2 spray  2 spray Each Nare Daily Desai, Rahul P, PA-C   2 spray at 03/29/18 0928  . hydrALAZINE (APRESOLINE) injection 10-20 mg  10-20 mg Intravenous Q4H PRN Duayne Cal, NP   20 mg at 03/27/18 1923  . levothyroxine (SYNTHROID, LEVOTHROID) tablet 200 mcg  200 mcg Oral QAC breakfast Celine Mans, Rahul P, PA-C   200 mcg at 03/29/18 2725  . lisinopril (PRINIVIL,ZESTRIL) tablet 20 mg  20 mg Oral BID Celine Mans, Rahul P, PA-C   20 mg at 03/29/18 3664  . magnesium oxide (MAG-OX) tablet 400 mg  400 mg Oral Daily Desai, Rahul P, PA-C   400 mg at 03/29/18 4034  . metoprolol tartrate (LOPRESSOR) tablet 25 mg  25 mg Oral BID Max Fickle B, MD   25 mg at 03/29/18 0928  . topiramate (TOPAMAX) tablet 50 mg  50 mg Oral BID Celine Mans, Rahul P, PA-C   50 mg at 03/29/18 7425  . vitamin B-12 (CYANOCOBALAMIN) tablet 1,000 mcg  1,000 mcg Oral Daily Celine Mans, Rahul P, PA-C   1,000 mcg at 03/29/18 9563     Discharge Medications: Please see discharge summary for a list of discharge medications.  Relevant Imaging Results:  Relevant Lab Results:   Additional Information SS# [id number]  Alexsis Branscom A Pascale Maves, LCSW

## 2018-03-30 DIAGNOSIS — I21A1 Myocardial infarction type 2: Secondary | ICD-10-CM

## 2018-03-30 DIAGNOSIS — I2510 Atherosclerotic heart disease of native coronary artery without angina pectoris: Secondary | ICD-10-CM

## 2018-03-30 MED ORDER — DILTIAZEM HCL ER 60 MG PO CP12
120.0000 mg | ORAL_CAPSULE | Freq: Two times a day (BID) | ORAL | Status: DC
  Administered 2018-03-30 – 2018-04-01 (×3): 120 mg via ORAL
  Filled 2018-03-30 (×4): qty 2

## 2018-03-30 NOTE — Progress Notes (Signed)
Patient Demographics:    [name], is a 62 y.o. female, DOB - [birth year], MHW:[id number]  Admit date - 03/25/2018   Admitting Physician Carin Hock, MD  Outpatient Primary MD for the patient is Panosh, Neta Mends, MD  LOS - 5   Chief Complaint  Patient presents with  . Headache        Subjective:    [name] today has no fevers, no emesis,  No chest pain, RN heather at bedside, patient is forgetful,  Assessment  & Plan :    Active Problems:   H/O: CVA (cerebrovascular accident)   Hypertensive emergency  Brief summary 63 y.o. female with a hx of CAD s/p CABG and subsequent stenting of the native LAD, PAF, chronic combined systolic and diastolic heart failure (EF 40% in 2015 >>improved to 65-70% in 3/16), hypertension, CKD, OSA intolerant to CPAP, CVA in 2012, COPD, bradycardia, tobacco use and recent intraventricular hemorrhage (03/18/18) , , readmitted from SNF to PCCM service on 03/25/2018 with headaches and elevated blood pressure and concerns about ICH, repeat CT head did not show any new brain bleed, transferred from [facility] service to hospitalist service on 03/28/2018. Okay to start insurance authorization for SNF placement as Patient is medically ready for discharge to SNF,    Plan:- 1)HTN- patient is off Cleviprex drip, BP is more stable, continue clonidine 0.1 mg twice daily, change Cardizem SR to 120 mg twice daily from every 8 hours, lisinopril 20 mg twice daily, c/n  metoprolol 25 mg twice daily, okay to use PRN IV hydralazine for elevated BP  2) H/o Prior RCA MCA infarct and Recent ICH/IVH-  patient had intraventricular hemorrhage (03/18/18) while on Xarelto and Plavix for stroke and CAD, neurosurgery advised conservative non-operative approach, repeat CT head in 3 to 4 weeks advised, if negative consider restarting Eliquis alone without antiplatelet agents, patient was readmitted  03/25/2018 with elevated blood pressure and headaches, repeat CT head on 03/25/2018 without acute bleed   3)CAD-post prior CABG and status post prior stenting stable, no ACS type symptoms, continue aspirin 81 mg daily, along with metoprolol 25 mill grams twice daily and Lipitor  4)PAfib--rate controlled better with p.o. Cardizem and p.o. metoprolol, heart rate is labile, change Cardizem SR to 120 mg twice daily from every 8 hours due to bradycardia , not on anticoagulation due to recent intraventricular hemorrhage, avoid amiodarone in order to avoid inducing conversion to sinus rhythm which may increase stroke risk as patient cannot be anticoagulated\  5)COPD--stable, bronchodilators as needed  6)HAs--Topamax for headache prophylaxis, Fioricet as needed for headache  7)Mood Disorder-- Taper off Wellbutrin given history of strokes and intracranial hemorrhage patient is at increased risk for seizures  8)Hypothyroidism--stable, continue levothyroxine  Code Status : Full code  Disposition Plan  : SNF- Okay to start insurance authorization for SNF placement as Patient is medically ready for discharge to SNF,    Consults  : Cardiology/ PCCM   DVT Prophylaxis  :   SCDs (recent ICH)  Lab Results  Component Value Date   PLT 251 03/27/2018    Inpatient Medications  Scheduled Meds: . aspirin EC  81 mg Oral Daily  . atorvastatin  40 mg Oral q1800  . buPROPion  150 mg Oral  q morning - 10a  . cloNIDine  0.1 mg Oral BID  . diltiazem  120 mg Oral Q8H  . escitalopram  10 mg Oral Daily  . famotidine  20 mg Oral Daily  . fluticasone  2 spray Each Nare Daily  . levothyroxine  200 mcg Oral QAC breakfast  . lisinopril  20 mg Oral BID  . magnesium oxide  400 mg Oral Daily  . metoprolol tartrate  25 mg Oral BID  . topiramate  50 mg Oral BID  . cyanocobalamin  1,000 mcg Oral Daily   Continuous Infusions: . sodium chloride Stopped (03/26/18 1430)   PRN Meds:.sodium chloride,  butalbital-acetaminophen-caffeine, hydrALAZINE    Anti-infectives (From admission, onward)   None        Objective:   Vitals:   03/30/18 1000 03/30/18 1222 03/30/18 1506 03/30/18 1639  BP: (!) 144/104 114/87 130/85 (!) 144/109  [name]: (!) 50 76  65  Resp: (!) 23   19  Temp:    97.8 F (36.6 C)  TempSrc:    Oral  SpO2: 95% 98%  96%  Weight:      Height:        Wt Readings from Last 3 Encounters:  03/30/18 73.9 kg (162 lb 14.7 oz)  03/18/18 77.7 kg (171 lb 4.8 oz)  03/22/18 77.6 kg (171 lb)     Intake/Output Summary (Last 24 hours) at 03/30/2018 1827 Last data filed at 03/30/2018 0830 Gross per 24 hour  Intake -  Output 250 ml  Net -250 ml     Physical Exam  Gen:- Awake Alert,  In no apparent distress  HEENT:- [place].AT, No sclera icterus Neck-Supple Neck,No JVD,.  Lungs-  CTAB , fair air movement CV- S1, S2 normal Abd-  +ve B.Sounds, Abd Soft, No tenderness,    Extremity/Skin:- No  edema,   good pulses Psych-affect is appropriate, oriented x3, cognitive deficits noted, patient is forgetful Neuro-no new focal deficits, no tremors   Data Review:   Micro Results No results found for this or any previous visit (from the past 240 hour(s)).  Radiology Reports Dg Chest 2 View  Result Date: 03/25/2018 CLINICAL DATA:  62 year old female with a history of dyspnea and headache EXAM: CHEST - 2 VIEW COMPARISON:  03/18/2018 FINDINGS: Cardiomediastinal silhouette unchanged in size and contour with surgical changes of prior CABG. Coarsened interstitial markings bilaterally. Lateral view demonstrates blunting of the costophrenic sulcus. Surgical changes of the right chest again demonstrated. No confluent airspace disease. No acute displaced fracture IMPRESSION: Chronic lung changes, with either scarring or small pleural effusion on the lateral view. No evidence of lobar pneumonia. Surgical changes of prior CABG. Electronically Signed   By: Gilmer Mor D.O.   On: 03/25/2018 19:45    Ct Head Wo Contrast  Result Date: 03/25/2018 CLINICAL DATA:  Mental status changes. Hypertension. Left-sided deficits from prior stroke. EXAM: CT HEAD WITHOUT CONTRAST TECHNIQUE: Contiguous axial images were obtained from the base of the skull through the vertex without intravenous contrast. COMPARISON:  Multiple exams, including 03/19/2018 FINDINGS: Brain: Right anterior middle cerebral artery distribution encephalomalacia with distribution of involvement similar to 03/19/2018. The brainstem and cerebellum appear unremarkable. Again observed is intraventricular hemorrhage filling most of the left lateral ventricle. There has been some clock contraction/reduction in overall volume, and the temporal horn of the left lateral ventricle is not dilated. Also the blood products in the third ventricle and in the right lateral ventricle appear to have intervally cleared. 6 mm of right-to-left  midline shift, formerly 8 mm. Periventricular white matter and corona radiata hypodensities favor chronic ischemic microvascular white matter disease. No new hemorrhage is identified. Vascular: There is atherosclerotic calcification of the cavernous carotid arteries bilaterally. Skull: Unremarkable Sinuses/Orbits: Left mastoidectomy with small right mastoid effusion and fluid in the remaining left mastoid air cells. No middle ear fluid. Other: No supplemental non-categorized findings. IMPRESSION: 1. Resolution of the prior blood products in the right lateral ventricle and third ventricle. Mild reduction in volume of the hyperdense blood products filling most of the left lateral ventricle. Reduction in left-to-right midline shift from previous 8 mm to current 6 mm. 2. Right anterior MCA distribution encephalomalacia compatible with remote stroke. 3. Periventricular white matter and corona radiata hypodensities favor chronic ischemic microvascular white matter disease. 4. Small right mastoid effusion.  Prior left mastoidectomy.  Electronically Signed   By: Gaylyn Rong M.D.   On: 03/25/2018 19:33   Ct Head Wo Contrast  Result Date: 03/19/2018 CLINICAL DATA:  Altered mental status. Stroke with sudden slow responses EXAM: CT HEAD WITHOUT CONTRAST TECHNIQUE: Contiguous axial images were obtained from the base of the skull through the vertex without intravenous contrast. COMPARISON:  Earlier today FINDINGS: Brain: Intraventricular hemorrhage in the left more than right lateral ventricles and in the third ventricle. Stable ventricular volume without temporal horn dilatation to suggest hydrocephalus. There is a degree of ventriculomegaly from volume loss. Remote right MCA distribution infarct affecting the insula and basal ganglia. Chronic small vessel ischemic type change in the cerebral white matter. Vascular: No hyperdense vessel or unexpected calcification. Skull: Negative.  Dermal inclusion cyst in the scalp. Sinuses/Orbits: Bilateral mastoid opacification with mastoidectomy on the left. IMPRESSION: 1. Unchanged intraventricular hemorrhage. Stable ventricular volume without hydrocephalus. 2. Remote right MCA branch infarct. Electronically Signed   By: Marnee Spring M.D.   On: 03/19/2018 19:01   Ct Head Wo Contrast  Result Date: 03/19/2018 CLINICAL DATA:  Follow up stroke. EXAM: CT HEAD WITHOUT CONTRAST TECHNIQUE: Contiguous axial images were obtained from the base of the skull through the vertex without intravenous contrast. COMPARISON:  CT HEAD March 18, 2018. FINDINGS: BRAIN: Similar intraventricular hemorrhage predominately within LEFT lateral ventricle. Moderate ventriculomegaly in the basis of parenchymal brain volume loss. No hydrocephalus. No intraparenchymal hemorrhage or acute large vascular territory infarct. Old RIGHT basal ganglia, frontotemporal infarcts. Ex vacuo dilatation RIGHT frontal horn of the lateral ventricle. Patchy supratentorial white matter hypodensities exclusively for mention abnormality. No  abnormal extra-axial fluid collections. Basal cisterns are patent. VASCULAR: Mild calcific atherosclerosis of the carotid siphons. SKULL: No skull fracture. LEFT parietal scalp scarring. Numerous subcentimeter calcified scalp tricholemmal cysts. SINUSES/ORBITS: LEFT wall up mastoidectomy. Small amount of soft tissue within the surgical bed. Small RIGHT mastoid effusion.The included ocular globes and orbital contents are non-suspicious. OTHER: None. IMPRESSION: 1. Similar intraventricular hemorrhage.  No hydrocephalus. 2. Old RIGHT MCA territory infarct. 3. Mild chronic small vessel ischemic changes. Electronically Signed   By: Awilda Metro M.D.   On: 03/19/2018 05:31   Ct Head Wo Contrast  Result Date: 03/18/2018 CLINICAL DATA:  Headache and lethargy. EXAM: CT HEAD WITHOUT CONTRAST TECHNIQUE: Contiguous axial images were obtained from the base of the skull through the vertex without intravenous contrast. COMPARISON:  March 18, 2015 FINDINGS: Brain: There is mild diffuse atrophy, stable. Hemorrhage fills the left lateral ventricle and much of the third lateral ventricle. A small amount of hemorrhage extends into the posterior aspect of the frontal horn of the right lateral ventricle. This  hemorrhage is causing slight dilatation of the left lateral ventricle. No hemorrhage is evident outside of the ventricular system currently. There is 3 mm of midline shift toward the right. There is evidence of a prior infarct involving portions of the posterior right frontal and superior right temporal lobe with infarct involving much of the right lentiform nucleus as well as the right claustrum, extreme capsule, and insular cortex. Elsewhere there is patchy small vessel disease in the centra semiovale bilaterally. No well-defined acute infarct evident. No subdural or epidural fluid collections are evident. No mass evident. There is stable prominence of the cavernous sinus region on the right compared to the left, stable  from 2016. Vascular: There is no hyperdense vessel. There is calcification in each carotid siphon. Skull: Bony calvarium appears intact. Sinuses/Orbits: There is mucosal thickening in multiple ethmoid air cells. Orbits appear symmetric bilaterally. Other: Patient has had previous mastoidectomy on the left. There is opacification of the remaining mastoid air cells on the left. There is opacification in multiple mastoid air cells on the right. There is a sebaceous cyst posterior to the midline occipital bones measuring 8 x 7 mm. IMPRESSION: Focal hemorrhage filling most of the left lateral and third ventricles with mild extension into the posterior aspect of the frontal horn of the right lateral ventricle. There is slight dilatation of the left lateral ventricle. There is 3 mm of midline shift toward the right. No hemorrhage is seen within the brain parenchyma on this study. There is atrophy. There is prior infarct involving portions of the posterior right frontal and superior anterior right temporal lobes as well as much of the right claustrum, extreme capsule, and insular cortex. Much of the right lentiform nucleus is involved with this infarct as well. There is patchy periventricular small vessel disease elsewhere without evident acute infarct. There are foci of arterial vascular calcification. There is mucosal thickening in multiple ethmoid air cells. There is extensive mastoid disease bilaterally with evidence of previous mastoidectomy on the left. Critical Value/emergent results were called by telephone at the time of interpretation on 03/18/2018 at 7:47 am to Dr. Bethann Berkshire , who verbally acknowledged these results. Electronically Signed   By: Bretta Bang III M.D.   On: 03/18/2018 07:47   Mr Maxine Glenn Head Wo Contrast  Result Date: 03/22/2018 CLINICAL DATA:  Intraventricular hemorrhage EXAM: MRA HEAD WITHOUT CONTRAST TECHNIQUE: Angiographic images of the Circle of Willis were obtained using MRA technique  without intravenous contrast. COMPARISON:  Brain MRI 03/20/2018 FINDINGS: There is unchanged intraventricular blood, predominantly in the left lateral ventricle. The size and configuration of the ventricles are unchanged. ANTERIOR CIRCULATION: --Intracranial internal carotid arteries: Normal. --Anterior cerebral arteries: Normal variant absent left A1 segment. Anterior communicating arteries patent. The A2 segments and beyond are normal. --Middle cerebral arteries: Normal. --Posterior communicating arteries: Present on the left only. POSTERIOR CIRCULATION: --Basilar artery: Normal. --Posterior cerebral arteries: Normal.  Fetal origin on the left. --Superior cerebellar arteries: Normal. --Inferior cerebellar arteries: Normal anterior and posterior inferior cerebellar arteries. IMPRESSION: 1. No aneurysm, large vessel occlusion or high-grade stenosis. 2. Unchanged distribution of intraventricular blood, predominantly located in the left lateral ventricle. Size and configuration of the ventricles are unchanged. Electronically Signed   By: Deatra Robinson M.D.   On: 03/22/2018 02:27   Mr Laqueta Jean ZO Contrast  Result Date: 03/20/2018 CLINICAL DATA:  Headache since yesterday. Evaluate interventricular hemorrhage. History of stroke, atrial fibrillation, hypertension, hyperlipidemia. EXAM: MRI HEAD WITHOUT AND WITH CONTRAST TECHNIQUE: Multiplanar, multiecho [name]  sequences of the brain and surrounding structures were obtained without and with intravenous contrast. CONTRAST:  15mL MULTIHANCE GADOBENATE DIMEGLUMINE 529 MG/ML IV SOLN COMPARISON:  CT HEAD March 19, 2018 and MRI of the head Jan 20, 2014. FINDINGS: INTRACRANIAL CONTENTS: Extensive blood products LEFT lateral ventricle with low T2 signal. Intermediate blood products layering in RIGHT occipital horn. LEFT subependymal reduced diffusion most compatible with artifact. A few punctate RIGHT cerebrum white matter foci reduced diffusion without identifiable corresponding  ADC abnormality. RIGHT frontotemporal and RIGHT basal ganglia encephalomalacia with susceptibility artifact and surrounding gliosis. Ex vacuo dilatation RIGHT lateral ventricle with resultant minimal LEFT-to-RIGHT midline shift. No hydrocephalus. Old small LEFT cerebellar infarct. RIGHT cerebral peduncle volume loss consistent with wallerian degeneration. Patchy to confluent supratentorial pontine white matter FLAIR T2 hyperintensities. No abnormal extra-axial fluid collections. No abnormal intraparenchymal or extra-axial enhancement. VASCULAR: Normal major intracranial vascular flow voids present at skull base. SKULL AND UPPER CERVICAL SPINE: No abnormal sellar expansion. No suspicious calvarial bone marrow signal. Craniocervical junction maintained. Scattered small tricholemmal cysts. SINUSES/ORBITS: Status post LEFT wall up mastoidectomy. RIGHT mastoid effusion.The included ocular globes and orbital contents are non-suspicious. OTHER: None. IMPRESSION: 1. Intraventricular hemorrhage of varying ages.  No hydrocephalus. 2. Subcentimeter acute versus subacute RIGHT cerebrum infarcts, possibly artifact. 3. Old RIGHT MCA territory infarct. 4. Old small LEFT cerebellar infarct. Moderate chronic small vessel ischemic changes. Electronically Signed   By: Awilda Metro M.D.   On: 03/20/2018 06:15   Dg Chest Portable 1 View  Result Date: 03/18/2018 CLINICAL DATA:  Atrial fibrillation. EXAM: PORTABLE CHEST 1 VIEW COMPARISON:  Jan 08, 2017 FINDINGS: There is no edema or consolidation. Heart is upper normal in size with pulmonary vascularity normal. Patient is status post coronary artery bypass grafting. There are surgical clips over the lateral upper right hemithorax. There is aortic atherosclerosis. No evident bone lesions. IMPRESSION: No edema or consolidation. Stable cardiac silhouette. Status post coronary artery bypass grafting. There is aortic atherosclerosis. Aortic Atherosclerosis (ICD10-I70.0). Electronically  Signed   By: Bretta Bang III M.D.   On: 03/18/2018 07:14     CBC Recent Labs  Lab 03/24/18 0520 03/25/18 1818 03/26/18 0416 03/27/18 0123  WBC 7.6 9.8 11.0* 13.3*  HGB 11.3* 13.6 13.1 13.7  HCT 33.9* 39.9 38.3 39.6  PLT 197 225 211 251  MCV 100.9* 99.8 99.0 98.0  MCH 33.6 34.0 33.9 33.9  MCHC 33.3 34.1 34.2 34.6  RDW 13.2 12.8 12.6 12.7    Chemistries  Recent Labs  Lab 03/24/18 0520 03/24/18 1652 03/25/18 1818 03/26/18 0416 03/27/18 0123 03/28/18 0228  NA 137  --  135 131* 130* 130*  K 3.7 3.7 3.7 3.3* 3.4* 3.4*  CL 106  --  100 96* 98 97*  CO2 24  --  24 22 20* 23  GLUCOSE 100*  --  100* 115* 112* 94  BUN 8  --  7 7 15 19   CREATININE 1.15*  --  1.00 0.91 0.99 0.91  CALCIUM 8.3*  --  9.2 8.9 9.1 9.1  MG  --   --   --  1.1* 1.3* 1.4*  AST  --   --  80*  --   --   --   ALT  --   --  15  --   --   --   ALKPHOS  --   --  64  --   --   --   BILITOT  --   --  1.0  --   --   --    ------------------------------------------------------------------------------------------------------------------  No results for input(s): CHOL, HDL, LDLCALC, TRIG, CHOLHDL, LDLDIRECT in the last 72 hours.  Lab Results  Component Value Date   HGBA1C 5.1 03/21/2018   ------------------------------------------------------------------------------------------------------------------ No results for input(s): TSH, T4TOTAL, T3FREE, THYROIDAB in the last 72 hours.  Invalid input(s): FREET3 ------------------------------------------------------------------------------------------------------------------ No results for input(s): VITAMINB12, FOLATE, FERRITIN, TIBC, IRON, RETICCTPCT in the last 72 hours.  Coagulation profile No results for input(s): INR, PROTIME in the last 168 hours.  No results for input(s): DDIMER in the last 72 hours.  Cardiac Enzymes Recent Labs  Lab 03/26/18 1252 03/26/18 1916 03/27/18 0123  TROPONINI 12.11* 12.40* 9.54*    ------------------------------------------------------------------------------------------------------------------    Component Value Date/Time   BNP 608.8 (H) 03/25/2018 2016     Shon Hale M.D on 03/30/2018 at 6:27 PM   Go to [email or web address] - password [email or web address] for contact info  [facility] - Office  [phone]

## 2018-03-30 NOTE — Care Management Important Message (Signed)
Important Message  Patient Details  Name: [name] MRN: [id number] Date of Birth: Oct 02, 1955   Medicare Important Message Given:  Yes    [name] 03/30/2018, 3:44 PM

## 2018-03-30 NOTE — Progress Notes (Signed)
Patient is medically ready for discharge to SNF,   she will need OT, PT and speech therapy  Okay to start insurance authorization for SNF placement as Patient is medically ready for discharge to SNF,   Shon Hale, MD

## 2018-03-30 NOTE — Progress Notes (Signed)
Physical Therapy Treatment Patient Details Name: [name] MRN: [id number] DOB: 05-28-1956 Today's Date: 03/30/2018    History of Present Illness Pt is a 62 y.o. female admitted from SNF on 03/25/18 with hypertensive urgency and fluid overload. Of note, recent admit for R MCA infarct with IVH on 03/19/18 with d/c to SNF on 03/24/18. PMH includes CAD, HF, HTN, a-fib (not on anticoagulation), R MCA infarct (2012).    PT Comments    Pt pleasant with flat affect, decreased cognition and balance. Pt caddy corner in bed on arrival and RN reports frequent repositioning throughout the day with RN Herbert Seta) declining OOB to chair even with alarm belt. Pt returned to bed in chair position end of session. Pt with improved gait and transfers today and able to ambulate 100'. Pt reported nausea end of session but no HA or pain with VSS. Will continue to follow.   BP pre gait 134/99, post 114/87 HR 76-83 98% on RA    Follow Up Recommendations  SNF;Supervision/Assistance - 24 hour     Equipment Recommendations  Rolling walker with 5" wheels    Recommendations for Other Services       Precautions / Restrictions Precautions Precautions: Fall Restrictions Weight Bearing Restrictions: No    Mobility  Bed Mobility Overal bed mobility: Needs Assistance Bed Mobility: Supine to Sit;Sit to Supine     Supine to sit: Min assist Sit to supine: Min guard   General bed mobility comments: min assist with HHA to elevate trunk and pivot to EOB with HOB 25 degrees. Pt able to return to bed without physical assist  Transfers Overall transfer level: Needs assistance   Transfers: Sit to/from Stand Sit to Stand: Min assist         General transfer comment: min assist to rise  Ambulation/Gait Ambulation/Gait assistance: Min assist Gait Distance (Feet): 100 Feet Assistive device: Rolling walker (2 wheeled) Gait Pattern/deviations: Step-through pattern;Decreased stride length   Gait velocity  interpretation: <1.8 ft/sec, indicate of risk for recurrent falls General Gait Details: pt veers right with assist and cues to step into Rw, steer RW and with fatigue place LUE on RW. Directional cues throughout   Stairs             Wheelchair Mobility    Modified Rankin (Stroke Patients Only)       Balance Overall balance assessment: Needs assistance Sitting-balance support: No upper extremity supported;Feet supported Sitting balance-Leahy Scale: Fair Sitting balance - Comments: sitting without LOB with no UE support     Standing balance-Leahy Scale: Poor Standing balance comment: UE support for balance                            Cognition Arousal/Alertness: Awake/alert Behavior During Therapy: Flat affect Overall Cognitive Status: No family/caregiver present to determine baseline cognitive functioning Area of Impairment: Orientation;Attention;Memory;Following commands;Safety/judgement;Awareness;Problem solving                 Orientation Level: Disoriented to;Time;Situation Current Attention Level: Sustained Memory: Decreased short-term memory Following Commands: Follows one step commands with increased time;Follows multi-step commands inconsistently Safety/Judgement: Decreased awareness of safety;Decreased awareness of deficits Awareness: Intellectual Problem Solving: Slow processing;Decreased initiation;Requires verbal cues General Comments: pt disoriented to month, day and year. States she is at [facility] but does not know why.       Exercises      General Comments        Pertinent Vitals/Pain Pain Assessment:  No/denies pain    Home Living                      Prior Function            PT Goals (current goals can now be found in the care plan section) Progress towards PT goals: Progressing toward goals    Frequency           PT Plan      Co-evaluation              AM-PAC PT "6 Clicks" Daily Activity   Outcome Measure  Difficulty turning over in bed (including adjusting bedclothes, sheets and blankets)?: None Difficulty moving from lying on back to sitting on the side of the bed? : Unable Difficulty sitting down on and standing up from a chair with arms (e.g., wheelchair, bedside commode, etc,.)?: Unable Help needed moving to and from a bed to chair (including a wheelchair)?: A Little Help needed walking in hospital room?: A Little Help needed climbing 3-5 steps with a railing? : A Lot 6 Click Score: 14    End of Session Equipment Utilized During Treatment: Gait belt Activity Tolerance: Patient tolerated treatment well Patient left: in bed;with call bell/phone within reach;with bed alarm set(RN Heather declined OOB to chair) Nurse Communication: Mobility status PT Visit Diagnosis: Other abnormalities of gait and mobility (R26.89);Other symptoms and signs involving the nervous system (R29.898);Muscle weakness (generalized) (M62.81)     Time: 1610-9604 PT Time Calculation (min) (ACUTE ONLY): 21 min  Charges:  $Gait Training: 8-22 mins                    G Codes:       Delaney Meigs, PT [phone]    Willia Genrich B Tonda Wiederhold 03/30/2018, 12:26 PM

## 2018-03-30 NOTE — Clinical Social Work Note (Signed)
Please notify CSW at least 24 hours prior to discharge so insurance authorization can be started.  Charlynn Court, CSW [phone]

## 2018-03-30 NOTE — Consult Note (Signed)
   [facility] CM Inpatient Consult   03/30/2018  [name] [birth year] [id number]  Patient screened for readmissions less than 30 days in [facility] Care Management with [employer] plan. Chart review reveals patient had recently been at a Skilled  Nursing facility and re-hospitalized with per MDs notes: 62 y.o. female with coronary artery disease, CABG, stenting LAD, paroxysmal atrial fibrillation, improved ejection fraction 70% with recent intracranial hemorrhage 03/18/2018 with left sided deficits. Readmitted from SNF [[facility]]  on 03/25/2018 with headaches and elevated blood pressure and concerns about ICH, repeat CT head did not show any new brain bleed.   Patient's family plan is to return to skilled nursing facility. CIR declined admittance related to returning home without 24 hour supervision concerns. Spoke with inpatient RNCM, Gavin Pound. Will follow for progress and needs.    For questions contact:   Charlesetta Shanks, RN BSN CCM [facility]  [phone] business mobile phone Toll free office [phone]

## 2018-03-30 NOTE — Progress Notes (Signed)
Progress Note  Patient Name: [name] Date of Encounter: 03/30/2018  Primary Cardiologist: Tobias Alexander, MD   Subjective   Yesterday headache, back pain.  She may be feeling better she thinks. Atrial fibrillation under good rate control.  Blood pressure elevated again today.  Inpatient Medications    Scheduled Meds: . aspirin EC  81 mg Oral Daily  . atorvastatin  40 mg Oral q1800  . buPROPion  150 mg Oral q morning - 10a  . cloNIDine  0.1 mg Oral BID  . diltiazem  120 mg Oral Q8H  . escitalopram  10 mg Oral Daily  . famotidine  20 mg Oral Daily  . fluticasone  2 spray Each Nare Daily  . levothyroxine  200 mcg Oral QAC breakfast  . lisinopril  20 mg Oral BID  . magnesium oxide  400 mg Oral Daily  . metoprolol tartrate  25 mg Oral BID  . topiramate  50 mg Oral BID  . cyanocobalamin  1,000 mcg Oral Daily   Continuous Infusions: . sodium chloride Stopped (03/26/18 1430)   PRN Meds: sodium chloride, butalbital-acetaminophen-caffeine, hydrALAZINE   Vital Signs    Vitals:   03/30/18 0400 03/30/18 0546 03/30/18 0551 03/30/18 0700  BP: 125/86  (!) 147/107 (!) 144/107  Pulse: (!) 38   78  Resp: (!) 22   17  Temp: 98.2 F (36.8 C)   97.6 F (36.4 C)  TempSrc: Oral   Oral  SpO2: 95%   97%  Weight:  162 lb 14.7 oz (73.9 kg)    Height:        Intake/Output Summary (Last 24 hours) at 03/30/2018 0803 Last data filed at 03/29/2018 1400 Gross per 24 hour  Intake 180 ml  Output 175 ml  Net 5 ml   Filed Weights   03/28/18 0555 03/29/18 0500 03/30/18 0546  Weight: 165 lb 9.1 oz (75.1 kg) 162 lb 14.7 oz (73.9 kg) 162 lb 14.7 oz (73.9 kg)    Telemetry    Atrial fibrillation-flutter good rate control- Personally Reviewed  ECG    No new- Personally Reviewed  Physical Exam   GEN: No acute distress.   Neck: No JVD Cardiac: IRRR, no murmurs, rubs, or gallops.  Respiratory: Clear to auscultation bilaterally. GI: Soft, nontender, non-distended  MS: No edema;  No deformity. Neuro:   Left-sided facial droop flat affect Psych: Normal affect   Labs    Chemistry Recent Labs  Lab 03/25/18 1818 03/26/18 0416 03/27/18 0123 03/28/18 0228  NA 135 131* 130* 130*  K 3.7 3.3* 3.4* 3.4*  CL 100 96* 98 97*  CO2 24 22 20* 23  GLUCOSE 100* 115* 112* 94  BUN 7 7 15 19   CREATININE 1.00 0.91 0.99 0.91  CALCIUM 9.2 8.9 9.1 9.1  PROT 6.9  --   --   --   ALBUMIN 3.9  --   --   --   AST 80*  --   --   --   ALT 15  --   --   --   ALKPHOS 64  --   --   --   BILITOT 1.0  --   --   --   GFRNONAA 60* >60 >60 >60  GFRAA >60 >60 >60 >60  ANIONGAP 11 13 12 10      Hematology Recent Labs  Lab 03/25/18 1818 03/26/18 0416 03/27/18 0123  WBC 9.8 11.0* 13.3*  RBC 4.00 3.87 4.04  HGB 13.6 13.1 13.7  HCT  39.9 38.3 39.6  MCV 99.8 99.0 98.0  MCH 34.0 33.9 33.9  MCHC 34.1 34.2 34.6  RDW 12.8 12.6 12.7  PLT 225 211 251    Cardiac Enzymes Recent Labs  Lab 03/26/18 0416 03/26/18 1252 03/26/18 1916 03/27/18 0123  TROPONINI 9.90* 12.11* 12.40* 9.54*   No results for input(s): TROPIPOC in the last 168 hours.   BNP Recent Labs  Lab 03/25/18 2016  BNP 608.8*     DDimer No results for input(s): DDIMER in the last 168 hours.   Radiology    No results found.  Cardiac Studies   ECHO 03/26/18:  - Left ventricle: The cavity size was normal. Systolic function was   at the lower limits of normal. The estimated ejection fraction   was 50%. Although no diagnostic regional wall motion abnormality   was identified, this possibility cannot be completely excluded on   the basis of this study. - Aortic valve: Trileaflet; mildly thickened, mildly calcified   leaflets. Transvalvular velocity was within the normal range.   There was no stenosis. There was no regurgitation. Valve area   (VTI): 1.45 cm^2. Valve area (Vmax): 2 cm^2. Valve area (Vmean):   1.73 cm^2. - Mitral valve: Calcified annulus. There was trivial regurgitation. - Left atrium: The  atrium was moderately dilated. - Right ventricle: Systolic function was mildly reduced. - Right atrium: The atrium was moderately dilated. - Tricuspid valve: There was trivial regurgitation. - Pulmonic valve: There was no significant regurgitation.  Impressions:  - Technically difficult study with limited anatomical views.  Patient Profile     62 y.o. female with coronary artery disease, CABG, stenting LAD, paroxysmal atrial fibrillation, improved ejection fraction 70% with recent intracranial hemorrhage 03/18/2018 readmitted with headache, no new bleed on CT.  Assessment & Plan    Paroxysmal atrial fibrillation - Continue with rate control strategy.  On both diltiazem as well as metoprolol.  Avoiding amiodarone to avoid conversion without anticoagulation.  Chronic anticoagulation - She is off of this because of recent intracranial hemorrhage.  Neurosurgery advised repeating head CT in about 4 weeks and if negative consider restarting Eliquis alone without any antiplatelet agents.  Hypertension  - per primary team.   Coronary artery disease post CABG - Currently on aspirin 81 mg, directed by neurology.  Plavix and Xarelto on hold per neurology.  Type II myocardial infarction -Troponin elevation approximately 10, however ECG showing no evidence of ST elevation, demonstrating nonspecific ST-T wave changes and echocardiogram showing low normal ejection fraction is no defined wall motion abnormality.  No chest pain.  Troponin elevation secondary to elevated blood pressure in the setting of underlying coronary artery disease, demand ischemia and is quite high portending a worsened prognosis.  Not a candidate for cardiac catheterization given her recent intracranial hemorrhage.  Can you with aggressive secondary prevention.  Does not need cardiac rehabilitation.  Hyperlipidemia -Continue with atorvastatin.  Unclear if she was taking it previous to hospitalization.   [facility] will  sign off.   Medication Recommendations: Continue with rate controlling agents as above Other recommendations (labs, testing, etc): No new testing Follow up as an outpatient: In 1 to 2 months with Dr. Delton See.  For questions or updates, please contact [facility] Please consult [email or web address] for contact info under Cardiology/STEMI.      Signed, Donato Schultz, MD  03/30/2018, 8:03 AM

## 2018-03-31 MED ORDER — LISINOPRIL 10 MG PO TABS
10.0000 mg | ORAL_TABLET | Freq: Every day | ORAL | Status: DC
  Administered 2018-04-01: 10 mg via ORAL
  Filled 2018-03-31: qty 1

## 2018-03-31 NOTE — Progress Notes (Signed)
Patient Demographics:    [name], is a 62 y.o. female, DOB - [birth year], UJW:[id number]  Admit date - 03/25/2018   Admitting Physician Carin Hock, MD  Outpatient Primary MD for the patient is Panosh, Neta Mends, MD  LOS - 6   Chief Complaint  Patient presents with  . Headache        Subjective:    [name] today has no fevers, no emesis,  No chest pain, patient is forgetful with cognitive deficits, able to move extremities fine, actually ambulated today with therapist in hallways  Assessment  & Plan :    Active Problems:   H/O: CVA (cerebrovascular accident)   Hypertensive emergency  Brief summary 62 y.o. female with a hx of CAD s/p CABG and subsequent stenting of the native LAD, PAF, chronic combined systolic and diastolic heart failure (EF 40% in 2015 >>improved to 65-70% in 3/16), hypertension, CKD, OSA intolerant to CPAP, CVA in 2012, COPD, bradycardia, tobacco use and recent intraventricular hemorrhage (03/18/18) , , readmitted from SNF to PCCM service on 03/25/2018 with headaches and elevated blood pressure and concerns about ICH, repeat CT head did not show any new brain bleed, transferred from [facility] service to hospitalist service on 03/28/2018.  Awaiting insurance authorization for SNF placement as Patient is medically ready for discharge to SNF,    Plan:- 1)HTN-BP is somewhat soft today, okay to decrease lisinopril to 10 mg daily,, continue clonidine 0.1 mg twice daily, continue Cardizem SR  120 mg twice daily , c/n  metoprolol 25 mg twice daily, okay to use PRN IV hydralazine for elevated BP  2) H/o Prior RCA MCA infarct and Recent ICH/IVH-  patient had intraventricular hemorrhage (03/18/18) while on Xarelto and Plavix for stroke and CAD, neurosurgery advised conservative non-operative approach, repeat CT head in 3 to 4 weeks advised, if negative consider restarting Eliquis alone  without antiplatelet agents, patient was readmitted 03/25/2018 with elevated blood pressure and headaches, repeat CT head on 03/25/2018 without acute bleed   3)CAD-post prior CABG and status post prior stenting stable, no ACS type symptoms, continue aspirin 81 mg daily, along with metoprolol 25 mill grams twice daily and Lipitor  4)PAfib--rate controlled better with p.o. Cardizem and p.o. metoprolol, heart rate is labile, continue Cardizem SR to 120 mg twice daily ,  , not on anticoagulation due to recent intraventricular hemorrhage, avoid amiodarone in order to avoid inducing conversion to sinus rhythm which may increase stroke risk as patient cannot be anticoagulated\  5)COPD--stable, bronchodilators as needed  6)HAs--Topamax for headache prophylaxis, Fioricet as needed for headache  7)Mood Disorder-- Taper off Wellbutrin given history of strokes and intracranial hemorrhage patient is at increased risk for seizures  8)Hypothyroidism--stable, continue levothyroxine  Code Status : Full code  Disposition Plan  : SNF-awaiting insurance authorization for SNF placement as Patient is medically ready for discharge to SNF,   Consults  : Cardiology/ PCCM  DVT Prophylaxis  :   SCDs (recent ICH)  Lab Results  Component Value Date   PLT 251 03/27/2018   Inpatient Medications  Scheduled Meds: . aspirin EC  81 mg Oral Daily  . atorvastatin  40 mg Oral q1800  . buPROPion  150 mg Oral q morning - 10a  .  cloNIDine  0.1 mg Oral BID  . diltiazem  120 mg Oral BID  . escitalopram  10 mg Oral Daily  . famotidine  20 mg Oral Daily  . fluticasone  2 spray Each Nare Daily  . levothyroxine  200 mcg Oral QAC breakfast  . [START ON 04/01/2018] lisinopril  10 mg Oral Daily  . magnesium oxide  400 mg Oral Daily  . metoprolol tartrate  25 mg Oral BID  . topiramate  50 mg Oral BID  . cyanocobalamin  1,000 mcg Oral Daily   Continuous Infusions: . sodium chloride Stopped (03/26/18 1430)   PRN Meds:.sodium  chloride, butalbital-acetaminophen-caffeine, hydrALAZINE   Anti-infectives (From admission, onward)   None        Objective:   Vitals:   03/31/18 1200 03/31/18 1400 03/31/18 1500 03/31/18 1600  BP: (!) 86/73 (!) 107/92  109/90  [name]: (!) 34 (!) 28 73 (!) 125  Resp: (!) 21 (!) 23  (!) 22  Temp:      TempSrc:      SpO2: 98% 100%  99%  Weight:      Height:        Wt Readings from Last 3 Encounters:  03/31/18 71.3 kg (157 lb 3 oz)  03/18/18 77.7 kg (171 lb 4.8 oz)  03/22/18 77.6 kg (171 lb)     Intake/Output Summary (Last 24 hours) at 03/31/2018 1816 Last data filed at 03/31/2018 1132 Gross per 24 hour  Intake 120 ml  Output 350 ml  Net -230 ml     Physical Exam  Gen:- Awake Alert,  In no apparent distress  HEENT:- [place].AT, No sclera icterus Neck-Supple Neck,No JVD,.  Lungs-  CTAB , fair air movement CV- S1, S2 normal Abd-  +ve B.Sounds, Abd Soft, No tenderness,    Extremity/Skin:- No  edema,   good pulses Psych-affect is appropriate, oriented x3, cognitive deficits noted, patient is forgetful Neuro-no new focal deficits, no tremors, able to ambulate with one assist,   Data Review:   Micro Results No results found for this or any previous visit (from the past 240 hour(s)).  Radiology Reports Dg Chest 2 View  Result Date: 03/25/2018 CLINICAL DATA:  62 year old female with a history of dyspnea and headache EXAM: CHEST - 2 VIEW COMPARISON:  03/18/2018 FINDINGS: Cardiomediastinal silhouette unchanged in size and contour with surgical changes of prior CABG. Coarsened interstitial markings bilaterally. Lateral view demonstrates blunting of the costophrenic sulcus. Surgical changes of the right chest again demonstrated. No confluent airspace disease. No acute displaced fracture IMPRESSION: Chronic lung changes, with either scarring or small pleural effusion on the lateral view. No evidence of lobar pneumonia. Surgical changes of prior CABG. Electronically Signed   By:  Gilmer Mor D.O.   On: 03/25/2018 19:45   Ct Head Wo Contrast  Result Date: 03/25/2018 CLINICAL DATA:  Mental status changes. Hypertension. Left-sided deficits from prior stroke. EXAM: CT HEAD WITHOUT CONTRAST TECHNIQUE: Contiguous axial images were obtained from the base of the skull through the vertex without intravenous contrast. COMPARISON:  Multiple exams, including 03/19/2018 FINDINGS: Brain: Right anterior middle cerebral artery distribution encephalomalacia with distribution of involvement similar to 03/19/2018. The brainstem and cerebellum appear unremarkable. Again observed is intraventricular hemorrhage filling most of the left lateral ventricle. There has been some clock contraction/reduction in overall volume, and the temporal horn of the left lateral ventricle is not dilated. Also the blood products in the third ventricle and in the right lateral ventricle appear to have intervally cleared.  6 mm of right-to-left midline shift, formerly 8 mm. Periventricular white matter and corona radiata hypodensities favor chronic ischemic microvascular white matter disease. No new hemorrhage is identified. Vascular: There is atherosclerotic calcification of the cavernous carotid arteries bilaterally. Skull: Unremarkable Sinuses/Orbits: Left mastoidectomy with small right mastoid effusion and fluid in the remaining left mastoid air cells. No middle ear fluid. Other: No supplemental non-categorized findings. IMPRESSION: 1. Resolution of the prior blood products in the right lateral ventricle and third ventricle. Mild reduction in volume of the hyperdense blood products filling most of the left lateral ventricle. Reduction in left-to-right midline shift from previous 8 mm to current 6 mm. 2. Right anterior MCA distribution encephalomalacia compatible with remote stroke. 3. Periventricular white matter and corona radiata hypodensities favor chronic ischemic microvascular white matter disease. 4. Small right  mastoid effusion.  Prior left mastoidectomy. Electronically Signed   By: Gaylyn Rong M.D.   On: 03/25/2018 19:33   Ct Head Wo Contrast  Result Date: 03/19/2018 CLINICAL DATA:  Altered mental status. Stroke with sudden slow responses EXAM: CT HEAD WITHOUT CONTRAST TECHNIQUE: Contiguous axial images were obtained from the base of the skull through the vertex without intravenous contrast. COMPARISON:  Earlier today FINDINGS: Brain: Intraventricular hemorrhage in the left more than right lateral ventricles and in the third ventricle. Stable ventricular volume without temporal horn dilatation to suggest hydrocephalus. There is a degree of ventriculomegaly from volume loss. Remote right MCA distribution infarct affecting the insula and basal ganglia. Chronic small vessel ischemic type change in the cerebral white matter. Vascular: No hyperdense vessel or unexpected calcification. Skull: Negative.  Dermal inclusion cyst in the scalp. Sinuses/Orbits: Bilateral mastoid opacification with mastoidectomy on the left. IMPRESSION: 1. Unchanged intraventricular hemorrhage. Stable ventricular volume without hydrocephalus. 2. Remote right MCA branch infarct. Electronically Signed   By: Marnee Spring M.D.   On: 03/19/2018 19:01   Ct Head Wo Contrast  Result Date: 03/19/2018 CLINICAL DATA:  Follow up stroke. EXAM: CT HEAD WITHOUT CONTRAST TECHNIQUE: Contiguous axial images were obtained from the base of the skull through the vertex without intravenous contrast. COMPARISON:  CT HEAD March 18, 2018. FINDINGS: BRAIN: Similar intraventricular hemorrhage predominately within LEFT lateral ventricle. Moderate ventriculomegaly in the basis of parenchymal brain volume loss. No hydrocephalus. No intraparenchymal hemorrhage or acute large vascular territory infarct. Old RIGHT basal ganglia, frontotemporal infarcts. Ex vacuo dilatation RIGHT frontal horn of the lateral ventricle. Patchy supratentorial white matter hypodensities  exclusively for mention abnormality. No abnormal extra-axial fluid collections. Basal cisterns are patent. VASCULAR: Mild calcific atherosclerosis of the carotid siphons. SKULL: No skull fracture. LEFT parietal scalp scarring. Numerous subcentimeter calcified scalp tricholemmal cysts. SINUSES/ORBITS: LEFT wall up mastoidectomy. Small amount of soft tissue within the surgical bed. Small RIGHT mastoid effusion.The included ocular globes and orbital contents are non-suspicious. OTHER: None. IMPRESSION: 1. Similar intraventricular hemorrhage.  No hydrocephalus. 2. Old RIGHT MCA territory infarct. 3. Mild chronic small vessel ischemic changes. Electronically Signed   By: Awilda Metro M.D.   On: 03/19/2018 05:31   Ct Head Wo Contrast  Result Date: 03/18/2018 CLINICAL DATA:  Headache and lethargy. EXAM: CT HEAD WITHOUT CONTRAST TECHNIQUE: Contiguous axial images were obtained from the base of the skull through the vertex without intravenous contrast. COMPARISON:  March 18, 2015 FINDINGS: Brain: There is mild diffuse atrophy, stable. Hemorrhage fills the left lateral ventricle and much of the third lateral ventricle. A small amount of hemorrhage extends into the posterior aspect of the frontal horn of the  right lateral ventricle. This hemorrhage is causing slight dilatation of the left lateral ventricle. No hemorrhage is evident outside of the ventricular system currently. There is 3 mm of midline shift toward the right. There is evidence of a prior infarct involving portions of the posterior right frontal and superior right temporal lobe with infarct involving much of the right lentiform nucleus as well as the right claustrum, extreme capsule, and insular cortex. Elsewhere there is patchy small vessel disease in the centra semiovale bilaterally. No well-defined acute infarct evident. No subdural or epidural fluid collections are evident. No mass evident. There is stable prominence of the cavernous sinus region on  the right compared to the left, stable from 2016. Vascular: There is no hyperdense vessel. There is calcification in each carotid siphon. Skull: Bony calvarium appears intact. Sinuses/Orbits: There is mucosal thickening in multiple ethmoid air cells. Orbits appear symmetric bilaterally. Other: Patient has had previous mastoidectomy on the left. There is opacification of the remaining mastoid air cells on the left. There is opacification in multiple mastoid air cells on the right. There is a sebaceous cyst posterior to the midline occipital bones measuring 8 x 7 mm. IMPRESSION: Focal hemorrhage filling most of the left lateral and third ventricles with mild extension into the posterior aspect of the frontal horn of the right lateral ventricle. There is slight dilatation of the left lateral ventricle. There is 3 mm of midline shift toward the right. No hemorrhage is seen within the brain parenchyma on this study. There is atrophy. There is prior infarct involving portions of the posterior right frontal and superior anterior right temporal lobes as well as much of the right claustrum, extreme capsule, and insular cortex. Much of the right lentiform nucleus is involved with this infarct as well. There is patchy periventricular small vessel disease elsewhere without evident acute infarct. There are foci of arterial vascular calcification. There is mucosal thickening in multiple ethmoid air cells. There is extensive mastoid disease bilaterally with evidence of previous mastoidectomy on the left. Critical Value/emergent results were called by telephone at the time of interpretation on 03/18/2018 at 7:47 am to Dr. Bethann Berkshire , who verbally acknowledged these results. Electronically Signed   By: Bretta Bang III M.D.   On: 03/18/2018 07:47   Mr Maxine Glenn Head Wo Contrast  Result Date: 03/22/2018 CLINICAL DATA:  Intraventricular hemorrhage EXAM: MRA HEAD WITHOUT CONTRAST TECHNIQUE: Angiographic images of the [place] of  [place] were obtained using MRA technique without intravenous contrast. COMPARISON:  Brain MRI 03/20/2018 FINDINGS: There is unchanged intraventricular blood, predominantly in the left lateral ventricle. The size and configuration of the ventricles are unchanged. ANTERIOR CIRCULATION: --Intracranial internal carotid arteries: Normal. --Anterior cerebral arteries: Normal variant absent left A1 segment. Anterior communicating arteries patent. The A2 segments and beyond are normal. --Middle cerebral arteries: Normal. --Posterior communicating arteries: Present on the left only. POSTERIOR CIRCULATION: --Basilar artery: Normal. --Posterior cerebral arteries: Normal.  Fetal origin on the left. --Superior cerebellar arteries: Normal. --Inferior cerebellar arteries: Normal anterior and posterior inferior cerebellar arteries. IMPRESSION: 1. No aneurysm, large vessel occlusion or high-grade stenosis. 2. Unchanged distribution of intraventricular blood, predominantly located in the left lateral ventricle. Size and configuration of the ventricles are unchanged. Electronically Signed   By: Deatra Robinson M.D.   On: 03/22/2018 02:27   Mr Laqueta Jean ZO Contrast  Result Date: 03/20/2018 CLINICAL DATA:  Headache since yesterday. Evaluate interventricular hemorrhage. History of stroke, atrial fibrillation, hypertension, hyperlipidemia. EXAM: MRI HEAD WITHOUT AND WITH CONTRAST  TECHNIQUE: Multiplanar, multiecho [name] sequences of the brain and surrounding structures were obtained without and with intravenous contrast. CONTRAST:  15mL MULTIHANCE GADOBENATE DIMEGLUMINE 529 MG/ML IV SOLN COMPARISON:  CT HEAD March 19, 2018 and MRI of the head Jan 20, 2014. FINDINGS: INTRACRANIAL CONTENTS: Extensive blood products LEFT lateral ventricle with low T2 signal. Intermediate blood products layering in RIGHT occipital horn. LEFT subependymal reduced diffusion most compatible with artifact. A few punctate RIGHT cerebrum white matter foci reduced  diffusion without identifiable corresponding ADC abnormality. RIGHT frontotemporal and RIGHT basal ganglia encephalomalacia with susceptibility artifact and surrounding gliosis. Ex vacuo dilatation RIGHT lateral ventricle with resultant minimal LEFT-to-RIGHT midline shift. No hydrocephalus. Old small LEFT cerebellar infarct. RIGHT cerebral peduncle volume loss consistent with wallerian degeneration. Patchy to confluent supratentorial pontine white matter FLAIR T2 hyperintensities. No abnormal extra-axial fluid collections. No abnormal intraparenchymal or extra-axial enhancement. VASCULAR: Normal major intracranial vascular flow voids present at skull base. SKULL AND UPPER CERVICAL SPINE: No abnormal sellar expansion. No suspicious calvarial bone marrow signal. Craniocervical junction maintained. Scattered small tricholemmal cysts. SINUSES/ORBITS: Status post LEFT wall up mastoidectomy. RIGHT mastoid effusion.The included ocular globes and orbital contents are non-suspicious. OTHER: None. IMPRESSION: 1. Intraventricular hemorrhage of varying ages.  No hydrocephalus. 2. Subcentimeter acute versus subacute RIGHT cerebrum infarcts, possibly artifact. 3. Old RIGHT MCA territory infarct. 4. Old small LEFT cerebellar infarct. Moderate chronic small vessel ischemic changes. Electronically Signed   By: Awilda Metro M.D.   On: 03/20/2018 06:15   Dg Chest Portable 1 View  Result Date: 03/18/2018 CLINICAL DATA:  Atrial fibrillation. EXAM: PORTABLE CHEST 1 VIEW COMPARISON:  Jan 08, 2017 FINDINGS: There is no edema or consolidation. Heart is upper normal in size with pulmonary vascularity normal. Patient is status post coronary artery bypass grafting. There are surgical clips over the lateral upper right hemithorax. There is aortic atherosclerosis. No evident bone lesions. IMPRESSION: No edema or consolidation. Stable cardiac silhouette. Status post coronary artery bypass grafting. There is aortic atherosclerosis. Aortic  Atherosclerosis (ICD10-I70.0). Electronically Signed   By: Bretta Bang III M.D.   On: 03/18/2018 07:14     CBC Recent Labs  Lab 03/25/18 1818 03/26/18 0416 03/27/18 0123  WBC 9.8 11.0* 13.3*  HGB 13.6 13.1 13.7  HCT 39.9 38.3 39.6  PLT 225 211 251  MCV 99.8 99.0 98.0  MCH 34.0 33.9 33.9  MCHC 34.1 34.2 34.6  RDW 12.8 12.6 12.7    Chemistries  Recent Labs  Lab 03/25/18 1818 03/26/18 0416 03/27/18 0123 03/28/18 0228  NA 135 131* 130* 130*  K 3.7 3.3* 3.4* 3.4*  CL 100 96* 98 97*  CO2 24 22 20* 23  GLUCOSE 100* 115* 112* 94  BUN 7 7 15 19   CREATININE 1.00 0.91 0.99 0.91  CALCIUM 9.2 8.9 9.1 9.1  MG  --  1.1* 1.3* 1.4*  AST 80*  --   --   --   ALT 15  --   --   --   ALKPHOS 64  --   --   --   BILITOT 1.0  --   --   --    ------------------------------------------------------------------------------------------------------------------ No results for input(s): CHOL, HDL, LDLCALC, TRIG, CHOLHDL, LDLDIRECT in the last 72 hours.  Lab Results  Component Value Date   HGBA1C 5.1 03/21/2018   ------------------------------------------------------------------------------------------------------------------ No results for input(s): TSH, T4TOTAL, T3FREE, THYROIDAB in the last 72 hours.  Invalid input(s): FREET3 ------------------------------------------------------------------------------------------------------------------ No results for input(s): VITAMINB12, FOLATE, FERRITIN, TIBC, IRON, RETICCTPCT in the  last 72 hours.  Coagulation profile No results for input(s): INR, PROTIME in the last 168 hours.  No results for input(s): DDIMER in the last 72 hours.  Cardiac Enzymes Recent Labs  Lab 03/26/18 1252 03/26/18 1916 03/27/18 0123  TROPONINI 12.11* 12.40* 9.54*   ------------------------------------------------------------------------------------------------------------------    Component Value Date/Time   BNP 608.8 (H) 03/25/2018 2016     Shon Hale M.D on 03/31/2018 at 6:16 PM   Go to [email or web address] - password [email or web address] for contact info  [facility] - Office  [phone]

## 2018-03-31 NOTE — Clinical Social Work Note (Addendum)
Insurance authorization started at 8:40 am for discharge back to [facility] today.  Charlynn Court, CSW [phone]  2:20 pm CSW left voicemail for [employer] to check auth status.  Charlynn Court, CSW [phone]  3:25 pm CSW left voicemail for Darl Pikes at [facility] to check auth status.  Charlynn Court, CSW [phone]  4:07 pm Medical director at [facility] is still reviewing authorization information.  Charlynn Court, CSW [phone]

## 2018-04-01 DIAGNOSIS — Z720 Tobacco use: Secondary | ICD-10-CM | POA: Diagnosis not present

## 2018-04-01 DIAGNOSIS — A419 Sepsis, unspecified organism: Secondary | ICD-10-CM | POA: Diagnosis not present

## 2018-04-01 DIAGNOSIS — N183 Chronic kidney disease, stage 3 (moderate): Secondary | ICD-10-CM | POA: Diagnosis not present

## 2018-04-01 DIAGNOSIS — I6990 Unspecified sequelae of unspecified cerebrovascular disease: Secondary | ICD-10-CM | POA: Diagnosis not present

## 2018-04-01 DIAGNOSIS — E43 Unspecified severe protein-calorie malnutrition: Secondary | ICD-10-CM | POA: Diagnosis not present

## 2018-04-01 DIAGNOSIS — R911 Solitary pulmonary nodule: Secondary | ICD-10-CM | POA: Diagnosis not present

## 2018-04-01 DIAGNOSIS — R6510 Systemic inflammatory response syndrome (SIRS) of non-infectious origin without acute organ dysfunction: Secondary | ICD-10-CM | POA: Diagnosis not present

## 2018-04-01 DIAGNOSIS — W19XXXA Unspecified fall, initial encounter: Secondary | ICD-10-CM | POA: Diagnosis not present

## 2018-04-01 DIAGNOSIS — I482 Chronic atrial fibrillation: Secondary | ICD-10-CM | POA: Diagnosis not present

## 2018-04-01 DIAGNOSIS — I25810 Atherosclerosis of coronary artery bypass graft(s) without angina pectoris: Secondary | ICD-10-CM | POA: Diagnosis not present

## 2018-04-01 DIAGNOSIS — J449 Chronic obstructive pulmonary disease, unspecified: Secondary | ICD-10-CM | POA: Diagnosis not present

## 2018-04-01 DIAGNOSIS — R230 Cyanosis: Secondary | ICD-10-CM | POA: Diagnosis not present

## 2018-04-01 DIAGNOSIS — I639 Cerebral infarction, unspecified: Secondary | ICD-10-CM | POA: Diagnosis not present

## 2018-04-01 DIAGNOSIS — R29810 Facial weakness: Secondary | ICD-10-CM | POA: Diagnosis not present

## 2018-04-01 DIAGNOSIS — R5383 Other fatigue: Secondary | ICD-10-CM | POA: Diagnosis not present

## 2018-04-01 DIAGNOSIS — E039 Hypothyroidism, unspecified: Secondary | ICD-10-CM | POA: Diagnosis not present

## 2018-04-01 DIAGNOSIS — F17210 Nicotine dependence, cigarettes, uncomplicated: Secondary | ICD-10-CM | POA: Diagnosis not present

## 2018-04-01 DIAGNOSIS — I5032 Chronic diastolic (congestive) heart failure: Secondary | ICD-10-CM | POA: Diagnosis not present

## 2018-04-01 DIAGNOSIS — N390 Urinary tract infection, site not specified: Secondary | ICD-10-CM | POA: Diagnosis not present

## 2018-04-01 DIAGNOSIS — R05 Cough: Secondary | ICD-10-CM | POA: Diagnosis not present

## 2018-04-01 DIAGNOSIS — R0989 Other specified symptoms and signs involving the circulatory and respiratory systems: Secondary | ICD-10-CM | POA: Diagnosis not present

## 2018-04-01 DIAGNOSIS — G44221 Chronic tension-type headache, intractable: Secondary | ICD-10-CM | POA: Diagnosis not present

## 2018-04-01 DIAGNOSIS — R0902 Hypoxemia: Secondary | ICD-10-CM | POA: Diagnosis not present

## 2018-04-01 DIAGNOSIS — I110 Hypertensive heart disease with heart failure: Secondary | ICD-10-CM | POA: Diagnosis not present

## 2018-04-01 DIAGNOSIS — G4733 Obstructive sleep apnea (adult) (pediatric): Secondary | ICD-10-CM | POA: Diagnosis not present

## 2018-04-01 DIAGNOSIS — Z8614 Personal history of Methicillin resistant Staphylococcus aureus infection: Secondary | ICD-10-CM | POA: Diagnosis not present

## 2018-04-01 DIAGNOSIS — I480 Paroxysmal atrial fibrillation: Secondary | ICD-10-CM | POA: Diagnosis not present

## 2018-04-01 DIAGNOSIS — F0390 Unspecified dementia without behavioral disturbance: Secondary | ICD-10-CM | POA: Diagnosis not present

## 2018-04-01 DIAGNOSIS — M2550 Pain in unspecified joint: Secondary | ICD-10-CM | POA: Diagnosis not present

## 2018-04-01 DIAGNOSIS — R51 Headache: Secondary | ICD-10-CM | POA: Diagnosis not present

## 2018-04-01 DIAGNOSIS — E876 Hypokalemia: Secondary | ICD-10-CM | POA: Diagnosis not present

## 2018-04-01 DIAGNOSIS — I4891 Unspecified atrial fibrillation: Secondary | ICD-10-CM | POA: Diagnosis not present

## 2018-04-01 DIAGNOSIS — I959 Hypotension, unspecified: Secondary | ICD-10-CM | POA: Diagnosis not present

## 2018-04-01 DIAGNOSIS — F332 Major depressive disorder, recurrent severe without psychotic features: Secondary | ICD-10-CM | POA: Diagnosis not present

## 2018-04-01 DIAGNOSIS — E559 Vitamin D deficiency, unspecified: Secondary | ICD-10-CM | POA: Diagnosis not present

## 2018-04-01 DIAGNOSIS — Z743 Need for continuous supervision: Secondary | ICD-10-CM | POA: Diagnosis not present

## 2018-04-01 DIAGNOSIS — N179 Acute kidney failure, unspecified: Secondary | ICD-10-CM | POA: Diagnosis not present

## 2018-04-01 DIAGNOSIS — N3000 Acute cystitis without hematuria: Secondary | ICD-10-CM | POA: Diagnosis not present

## 2018-04-01 DIAGNOSIS — I429 Cardiomyopathy, unspecified: Secondary | ICD-10-CM | POA: Diagnosis not present

## 2018-04-01 DIAGNOSIS — I629 Nontraumatic intracranial hemorrhage, unspecified: Secondary | ICD-10-CM | POA: Diagnosis not present

## 2018-04-01 DIAGNOSIS — I2510 Atherosclerotic heart disease of native coronary artery without angina pectoris: Secondary | ICD-10-CM | POA: Diagnosis not present

## 2018-04-01 DIAGNOSIS — E869 Volume depletion, unspecified: Secondary | ICD-10-CM | POA: Diagnosis not present

## 2018-04-01 DIAGNOSIS — I481 Persistent atrial fibrillation: Secondary | ICD-10-CM | POA: Diagnosis not present

## 2018-04-01 DIAGNOSIS — N3090 Cystitis, unspecified without hematuria: Secondary | ICD-10-CM | POA: Diagnosis not present

## 2018-04-01 DIAGNOSIS — I10 Essential (primary) hypertension: Secondary | ICD-10-CM | POA: Diagnosis not present

## 2018-04-01 DIAGNOSIS — G9340 Encephalopathy, unspecified: Secondary | ICD-10-CM | POA: Diagnosis not present

## 2018-04-01 DIAGNOSIS — R4781 Slurred speech: Secondary | ICD-10-CM | POA: Diagnosis not present

## 2018-04-01 DIAGNOSIS — Z836 Family history of other diseases of the respiratory system: Secondary | ICD-10-CM | POA: Diagnosis not present

## 2018-04-01 DIAGNOSIS — G9341 Metabolic encephalopathy: Secondary | ICD-10-CM | POA: Diagnosis not present

## 2018-04-01 DIAGNOSIS — I615 Nontraumatic intracerebral hemorrhage, intraventricular: Secondary | ICD-10-CM | POA: Diagnosis not present

## 2018-04-01 DIAGNOSIS — Z008 Encounter for other general examination: Secondary | ICD-10-CM | POA: Diagnosis not present

## 2018-04-01 DIAGNOSIS — R2681 Unsteadiness on feet: Secondary | ICD-10-CM | POA: Diagnosis not present

## 2018-04-01 DIAGNOSIS — L309 Dermatitis, unspecified: Secondary | ICD-10-CM | POA: Diagnosis not present

## 2018-04-01 DIAGNOSIS — M6281 Muscle weakness (generalized): Secondary | ICD-10-CM | POA: Diagnosis not present

## 2018-04-01 DIAGNOSIS — R0789 Other chest pain: Secondary | ICD-10-CM | POA: Diagnosis not present

## 2018-04-01 DIAGNOSIS — R630 Anorexia: Secondary | ICD-10-CM | POA: Diagnosis not present

## 2018-04-01 DIAGNOSIS — Z951 Presence of aortocoronary bypass graft: Secondary | ICD-10-CM | POA: Diagnosis not present

## 2018-04-01 DIAGNOSIS — R279 Unspecified lack of coordination: Secondary | ICD-10-CM | POA: Diagnosis not present

## 2018-04-01 DIAGNOSIS — I69354 Hemiplegia and hemiparesis following cerebral infarction affecting left non-dominant side: Secondary | ICD-10-CM | POA: Diagnosis not present

## 2018-04-01 DIAGNOSIS — I63512 Cerebral infarction due to unspecified occlusion or stenosis of left middle cerebral artery: Secondary | ICD-10-CM | POA: Diagnosis not present

## 2018-04-01 DIAGNOSIS — F329 Major depressive disorder, single episode, unspecified: Secondary | ICD-10-CM | POA: Diagnosis not present

## 2018-04-01 DIAGNOSIS — E860 Dehydration: Secondary | ICD-10-CM | POA: Diagnosis not present

## 2018-04-01 DIAGNOSIS — Z8673 Personal history of transient ischemic attack (TIA), and cerebral infarction without residual deficits: Secondary | ICD-10-CM | POA: Diagnosis not present

## 2018-04-01 DIAGNOSIS — R4182 Altered mental status, unspecified: Secondary | ICD-10-CM | POA: Diagnosis not present

## 2018-04-01 DIAGNOSIS — K219 Gastro-esophageal reflux disease without esophagitis: Secondary | ICD-10-CM | POA: Diagnosis not present

## 2018-04-01 DIAGNOSIS — Z8249 Family history of ischemic heart disease and other diseases of the circulatory system: Secondary | ICD-10-CM | POA: Diagnosis not present

## 2018-04-01 DIAGNOSIS — E785 Hyperlipidemia, unspecified: Secondary | ICD-10-CM | POA: Diagnosis not present

## 2018-04-01 DIAGNOSIS — I25709 Atherosclerosis of coronary artery bypass graft(s), unspecified, with unspecified angina pectoris: Secondary | ICD-10-CM | POA: Diagnosis not present

## 2018-04-01 DIAGNOSIS — I248 Other forms of acute ischemic heart disease: Secondary | ICD-10-CM | POA: Diagnosis not present

## 2018-04-01 DIAGNOSIS — R531 Weakness: Secondary | ICD-10-CM | POA: Diagnosis not present

## 2018-04-01 DIAGNOSIS — J309 Allergic rhinitis, unspecified: Secondary | ICD-10-CM | POA: Diagnosis not present

## 2018-04-01 LAB — BASIC METABOLIC PANEL
Anion gap: 9 (ref 5–15)
BUN: 19 mg/dL (ref 6–20)
CALCIUM: 9.3 mg/dL (ref 8.9–10.3)
CO2: 22 mmol/L (ref 22–32)
CREATININE: 0.96 mg/dL (ref 0.44–1.00)
Chloride: 102 mmol/L (ref 98–111)
GFR calc Af Amer: >60 mL/min (ref >60)
Glucose, Bld: 104 mg/dL — ABNORMAL HIGH (ref 70–99)
Potassium: 4 mmol/L (ref 3.5–5.1)
Sodium: 133 mmol/L — ABNORMAL LOW (ref 135–145)

## 2018-04-01 LAB — CBC
HEMATOCRIT: 40.3 % (ref 36.0–46.0)
Hemoglobin: 13.5 g/dL (ref 12.0–15.0)
MCH: 33.4 pg (ref 26.0–34.0)
MCHC: 33.5 g/dL (ref 30.0–36.0)
MCV: 99.8 fL (ref 78.0–100.0)
PLATELETS: 269 10*3/uL (ref 150–400)
RBC: 4.04 MIL/uL (ref 3.87–5.11)
RDW: 12.3 % (ref 11.5–15.5)
WBC: 8.3 10*3/uL (ref 4.0–10.5)

## 2018-04-01 LAB — MAGNESIUM: Magnesium: 1.7 mg/dL (ref 1.7–2.4)

## 2018-04-01 MED ORDER — METOPROLOL TARTRATE 25 MG PO TABS: 25.0000 mg | ORAL_TABLET | Freq: Two times a day (BID) | ORAL | Status: DC

## 2018-04-01 MED ORDER — DILTIAZEM HCL ER 120 MG PO CP12: 120.0000 mg | ORAL_CAPSULE | Freq: Two times a day (BID) | ORAL | Status: DC

## 2018-04-01 MED ORDER — LISINOPRIL 10 MG PO TABS: 10.0000 mg | ORAL_TABLET | Freq: Every day | ORAL | Status: DC

## 2018-04-01 MED ORDER — CLONIDINE HCL 0.1 MG PO TABS: 0.1000 mg | ORAL_TABLET | Freq: Two times a day (BID) | ORAL | 11 refills | Status: DC

## 2018-04-01 MED ORDER — BUPROPION HCL ER (XL) 150 MG PO TB24: 150.0000 mg | ORAL_TABLET | Freq: Every morning | ORAL | Status: DC

## 2018-04-01 NOTE — Discharge Summary (Addendum)
[facility]  Physician Discharge Summary   Patient ID: BERDENA CISEK MRN: [id number] DOB/AGE: [birth year] 62 y.o.  Admit date: 03/25/2018 Discharge date: 04/01/2018  PCP: Madelin Headings, MD  DISCHARGE DIAGNOSES:  Poorly controlled hypertension  RECOMMENDATIONS FOR OUTPATIENT FOLLOW UP: 1. Patient's Foley catheter to be discontinued today based on discussions with nursing staff at the skilled nursing facility.  They will monitor for voiding once she is there. 2. Patient supposed to have follow-up with the stroke clinic in 2 to 3 weeks time. 3. It appears that she has an appointment with Dr. Everlena Cooper on August 14.   DISCHARGE CONDITION: fair  Diet recommendation: Heart healthy  Filed Weights   03/30/18 0546 03/31/18 0340 04/01/18 0400  Weight: 73.9 kg (162 lb 14.7 oz) 71.3 kg (157 lb 3 oz) 72.8 kg (160 lb 7.9 oz)    INITIAL HISTORY: 62 y.o.femalewith a hx of CADs/pCABG and subsequent stenting of the native LAD, PAF,chronic combined systolic and diastolic heart failure(EF 40% in 2015 >>improved to 65-70% in 3/16),hypertension, CKD, OSA intolerant to CPAP, CVA in 2012, COPD, bradycardia,tobacco use and recent intraventricular hemorrhage (03/18/18), readmitted from SNF to PCCM service on 03/25/2018 with headaches and elevated blood pressure and concerns about ICH, repeat CT head did not show any new brain bleed, transferred from [facility] service to hospitalist service on 03/28/2018.    Consultations:  Cardiology   HOSPITAL COURSE:   Poorly controlled hypertension  Patient was admitted with elevated blood pressures.  She was started on her usual antihypertensive regimen and clonidine was added.  She was seen by cardiology as well.  Blood pressure though elevated is much better controlled now.  Continue to monitor closely.  May need further adjustments at the skilled nursing facility.    H/o Prior RCA MCA infarct and Recent ICH/IVH Patient had intraventricular hemorrhage  (03/18/18) while on Xarelto and Plavix for stroke and CAD. Neurosurgery advised conservative non-operative approach and repeat CT head in 3 to 4 weeks advised. If negative consider restarting Eliquis alone without antiplatelet agents.  This was to be arranged at the neurology office.  It appears that patient has an appointment with a neurologist on August 14. Patient was readmitted 03/25/2018 with elevated blood pressure and headaches, repeat CT head on 03/25/2018 without acute bleed.  CAD-post prior CABG and status post prior stenting Stable, no ACS type symptoms. Continue aspirin 81 mg daily, along with metoprolol and Lipitor.  Paroxysmal atrial fibrillation  Heart rate was noted to be elevated this morning.  Patient apparently refused to take her nighttime medications.  Likely reason for poorly controlled heart rate.  She was given her morning medications and the heart rate is better controlled now.  Continue metoprolol and Cardizem.  Not on anticoagulation due to recent intraventricular hemorrhage. Avoid amiodarone in order to avoid inducing conversion to sinus rhythm which may increase stroke risk as patient cannot be anticoagulated.  COPD Stable, bronchodilators as needed  Chronic headaches  Topamax for headache prophylaxis, Fioricet as needed for headache  Mood Disorder Taper off Wellbutrin given history of strokes and intracranial hemorrhage as patient is at increased risk for seizures.  Dose has been reduced to 150 mg daily.  Hypothyroidism Stable, continue levothyroxine  Indwelling Foley catheter Unclear when this catheter was placed.  According to nursing staff she had her catheter when she was transferred from the ICU.  It is unclear if she was discharged with an indwelling catheter during her previous hospitalization or if she has had this catheter  longer.  Some complains of discomfort in her bladder.  No urine noted on bladder scan.  Foley to be flushed.  Foley is draining clear  yellow urine.  This issue will need to be investigated further.  If patient does not have a long-standing history of chronic Foley then voiding trial can be attempted in 1 week.  ADDENDUM: Nursing staff did call the nursing facility where she was discharged to after her previous hospitalization.  Apparently patient did not have a Foley catheter when she was there.  So it appears that this catheter was placed during this hospitalization.  We will discontinue the Foley as it is clearly causing her discomfort.  Nursing staff at the skilled nursing facility will monitor for voiding.   Medically she remains stable.  She will need ongoing therapy at a skilled nursing facility.  Appears to be okay for discharge today.   PERTINENT LABS:  The results of significant diagnostics from this hospitalization (including imaging, microbiology, ancillary and laboratory) are listed below for reference.     Labs: Basic Metabolic Panel: Recent Labs  Lab 03/25/18 1818 03/26/18 0416 03/27/18 0123 03/28/18 0228 04/01/18 0249  NA 135 131* 130* 130* 133*  K 3.7 3.3* 3.4* 3.4* 4.0  CL 100 96* 98 97* 102  CO2 24 22 20* 23 22  GLUCOSE 100* 115* 112* 94 104*  BUN 7 7 15 19 19   CREATININE 1.00 0.91 0.99 0.91 0.96  CALCIUM 9.2 8.9 9.1 9.1 9.3  MG  --  1.1* 1.3* 1.4* 1.7  PHOS  --  3.8 3.8 3.3  --    Liver Function Tests: Recent Labs  Lab 03/25/18 1818  AST 80*  ALT 15  ALKPHOS 64  BILITOT 1.0  PROT 6.9  ALBUMIN 3.9   CBC: Recent Labs  Lab 03/25/18 1818 03/26/18 0416 03/27/18 0123 04/01/18 0249  WBC 9.8 11.0* 13.3* 8.3  HGB 13.6 13.1 13.7 13.5  HCT 39.9 38.3 39.6 40.3  MCV 99.8 99.0 98.0 99.8  PLT 225 211 251 269   Cardiac Enzymes: Recent Labs  Lab 03/25/18 2235 03/26/18 0416 03/26/18 1252 03/26/18 1916 03/27/18 0123  TROPONINI 12.15* 9.90* 12.11* 12.40* 9.54*   BNP: BNP (last 3 results) Recent Labs    03/25/18 2016  BNP 608.8*    CBG: Recent Labs  Lab 03/25/18 2337  03/26/18 0810  GLUCAP 110* 111*     IMAGING STUDIES Dg Chest 2 View  Result Date: 03/25/2018 CLINICAL DATA:  62 year old female with a history of dyspnea and headache EXAM: CHEST - 2 VIEW COMPARISON:  03/18/2018 FINDINGS: Cardiomediastinal silhouette unchanged in size and contour with surgical changes of prior CABG. Coarsened interstitial markings bilaterally. Lateral view demonstrates blunting of the costophrenic sulcus. Surgical changes of the right chest again demonstrated. No confluent airspace disease. No acute displaced fracture IMPRESSION: Chronic lung changes, with either scarring or small pleural effusion on the lateral view. No evidence of lobar pneumonia. Surgical changes of prior CABG. Electronically Signed   By: Gilmer Mor D.O.   On: 03/25/2018 19:45   Ct Head Wo Contrast  Result Date: 03/25/2018 CLINICAL DATA:  Mental status changes. Hypertension. Left-sided deficits from prior stroke. EXAM: CT HEAD WITHOUT CONTRAST TECHNIQUE: Contiguous axial images were obtained from the base of the skull through the vertex without intravenous contrast. COMPARISON:  Multiple exams, including 03/19/2018 FINDINGS: Brain: Right anterior middle cerebral artery distribution encephalomalacia with distribution of involvement similar to 03/19/2018. The brainstem and cerebellum appear unremarkable. Again observed is intraventricular hemorrhage  filling most of the left lateral ventricle. There has been some clock contraction/reduction in overall volume, and the temporal horn of the left lateral ventricle is not dilated. Also the blood products in the third ventricle and in the right lateral ventricle appear to have intervally cleared. 6 mm of right-to-left midline shift, formerly 8 mm. Periventricular white matter and corona radiata hypodensities favor chronic ischemic microvascular white matter disease. No new hemorrhage is identified. Vascular: There is atherosclerotic calcification of the cavernous carotid  arteries bilaterally. Skull: Unremarkable Sinuses/Orbits: Left mastoidectomy with small right mastoid effusion and fluid in the remaining left mastoid air cells. No middle ear fluid. Other: No supplemental non-categorized findings. IMPRESSION: 1. Resolution of the prior blood products in the right lateral ventricle and third ventricle. Mild reduction in volume of the hyperdense blood products filling most of the left lateral ventricle. Reduction in left-to-right midline shift from previous 8 mm to current 6 mm. 2. Right anterior MCA distribution encephalomalacia compatible with remote stroke. 3. Periventricular white matter and corona radiata hypodensities favor chronic ischemic microvascular white matter disease. 4. Small right mastoid effusion.  Prior left mastoidectomy. Electronically Signed   By: Gaylyn Rong M.D.   On: 03/25/2018 19:33   Ct Head Wo Contrast  Result Date: 03/19/2018 CLINICAL DATA:  Altered mental status. Stroke with sudden slow responses EXAM: CT HEAD WITHOUT CONTRAST TECHNIQUE: Contiguous axial images were obtained from the base of the skull through the vertex without intravenous contrast. COMPARISON:  Earlier today FINDINGS: Brain: Intraventricular hemorrhage in the left more than right lateral ventricles and in the third ventricle. Stable ventricular volume without temporal horn dilatation to suggest hydrocephalus. There is a degree of ventriculomegaly from volume loss. Remote right MCA distribution infarct affecting the insula and basal ganglia. Chronic small vessel ischemic type change in the cerebral white matter. Vascular: No hyperdense vessel or unexpected calcification. Skull: Negative.  Dermal inclusion cyst in the scalp. Sinuses/Orbits: Bilateral mastoid opacification with mastoidectomy on the left. IMPRESSION: 1. Unchanged intraventricular hemorrhage. Stable ventricular volume without hydrocephalus. 2. Remote right MCA branch infarct. Electronically Signed   By: Marnee Spring M.D.   On: 03/19/2018 19:01   Ct Head Wo Contrast  Result Date: 03/19/2018 CLINICAL DATA:  Follow up stroke. EXAM: CT HEAD WITHOUT CONTRAST TECHNIQUE: Contiguous axial images were obtained from the base of the skull through the vertex without intravenous contrast. COMPARISON:  CT HEAD March 18, 2018. FINDINGS: BRAIN: Similar intraventricular hemorrhage predominately within LEFT lateral ventricle. Moderate ventriculomegaly in the basis of parenchymal brain volume loss. No hydrocephalus. No intraparenchymal hemorrhage or acute large vascular territory infarct. Old RIGHT basal ganglia, frontotemporal infarcts. Ex vacuo dilatation RIGHT frontal horn of the lateral ventricle. Patchy supratentorial white matter hypodensities exclusively for mention abnormality. No abnormal extra-axial fluid collections. Basal cisterns are patent. VASCULAR: Mild calcific atherosclerosis of the carotid siphons. SKULL: No skull fracture. LEFT parietal scalp scarring. Numerous subcentimeter calcified scalp tricholemmal cysts. SINUSES/ORBITS: LEFT wall up mastoidectomy. Small amount of soft tissue within the surgical bed. Small RIGHT mastoid effusion.The included ocular globes and orbital contents are non-suspicious. OTHER: None. IMPRESSION: 1. Similar intraventricular hemorrhage.  No hydrocephalus. 2. Old RIGHT MCA territory infarct. 3. Mild chronic small vessel ischemic changes. Electronically Signed   By: Awilda Metro M.D.   On: 03/19/2018 05:31   Ct Head Wo Contrast  Result Date: 03/18/2018 CLINICAL DATA:  Headache and lethargy. EXAM: CT HEAD WITHOUT CONTRAST TECHNIQUE: Contiguous axial images were obtained from the base of the skull through  the vertex without intravenous contrast. COMPARISON:  March 18, 2015 FINDINGS: Brain: There is mild diffuse atrophy, stable. Hemorrhage fills the left lateral ventricle and much of the third lateral ventricle. A small amount of hemorrhage extends into the posterior aspect of the frontal  horn of the right lateral ventricle. This hemorrhage is causing slight dilatation of the left lateral ventricle. No hemorrhage is evident outside of the ventricular system currently. There is 3 mm of midline shift toward the right. There is evidence of a prior infarct involving portions of the posterior right frontal and superior right temporal lobe with infarct involving much of the right lentiform nucleus as well as the right claustrum, extreme capsule, and insular cortex. Elsewhere there is patchy small vessel disease in the centra semiovale bilaterally. No well-defined acute infarct evident. No subdural or epidural fluid collections are evident. No mass evident. There is stable prominence of the cavernous sinus region on the right compared to the left, stable from 2016. Vascular: There is no hyperdense vessel. There is calcification in each carotid siphon. Skull: Bony calvarium appears intact. Sinuses/Orbits: There is mucosal thickening in multiple ethmoid air cells. Orbits appear symmetric bilaterally. Other: Patient has had previous mastoidectomy on the left. There is opacification of the remaining mastoid air cells on the left. There is opacification in multiple mastoid air cells on the right. There is a sebaceous cyst posterior to the midline occipital bones measuring 8 x 7 mm. IMPRESSION: Focal hemorrhage filling most of the left lateral and third ventricles with mild extension into the posterior aspect of the frontal horn of the right lateral ventricle. There is slight dilatation of the left lateral ventricle. There is 3 mm of midline shift toward the right. No hemorrhage is seen within the brain parenchyma on this study. There is atrophy. There is prior infarct involving portions of the posterior right frontal and superior anterior right temporal lobes as well as much of the right claustrum, extreme capsule, and insular cortex. Much of the right lentiform nucleus is involved with this infarct as well.  There is patchy periventricular small vessel disease elsewhere without evident acute infarct. There are foci of arterial vascular calcification. There is mucosal thickening in multiple ethmoid air cells. There is extensive mastoid disease bilaterally with evidence of previous mastoidectomy on the left. Critical Value/emergent results were called by telephone at the time of interpretation on 03/18/2018 at 7:47 am to Dr. Bethann Berkshire , who verbally acknowledged these results. Electronically Signed   By: Bretta Bang III M.D.   On: 03/18/2018 07:47   Mr Maxine Glenn Head Wo Contrast  Result Date: 03/22/2018 CLINICAL DATA:  Intraventricular hemorrhage EXAM: MRA HEAD WITHOUT CONTRAST TECHNIQUE: Angiographic images of the Circle of Willis were obtained using MRA technique without intravenous contrast. COMPARISON:  Brain MRI 03/20/2018 FINDINGS: There is unchanged intraventricular blood, predominantly in the left lateral ventricle. The size and configuration of the ventricles are unchanged. ANTERIOR CIRCULATION: --Intracranial internal carotid arteries: Normal. --Anterior cerebral arteries: Normal variant absent left A1 segment. Anterior communicating arteries patent. The A2 segments and beyond are normal. --Middle cerebral arteries: Normal. --Posterior communicating arteries: Present on the left only. POSTERIOR CIRCULATION: --Basilar artery: Normal. --Posterior cerebral arteries: Normal.  Fetal origin on the left. --Superior cerebellar arteries: Normal. --Inferior cerebellar arteries: Normal anterior and posterior inferior cerebellar arteries. IMPRESSION: 1. No aneurysm, large vessel occlusion or high-grade stenosis. 2. Unchanged distribution of intraventricular blood, predominantly located in the left lateral ventricle. Size and configuration of the ventricles are unchanged. Electronically  Signed   By: Deatra Robinson M.D.   On: 03/22/2018 02:27   Mr Laqueta Jean WN Contrast  Result Date: 03/20/2018 CLINICAL DATA:  Headache  since yesterday. Evaluate interventricular hemorrhage. History of stroke, atrial fibrillation, hypertension, hyperlipidemia. EXAM: MRI HEAD WITHOUT AND WITH CONTRAST TECHNIQUE: Multiplanar, multiecho pulse sequences of the brain and surrounding structures were obtained without and with intravenous contrast. CONTRAST:  15mL MULTIHANCE GADOBENATE DIMEGLUMINE 529 MG/ML IV SOLN COMPARISON:  CT HEAD March 19, 2018 and MRI of the head Jan 20, 2014. FINDINGS: INTRACRANIAL CONTENTS: Extensive blood products LEFT lateral ventricle with low T2 signal. Intermediate blood products layering in RIGHT occipital horn. LEFT subependymal reduced diffusion most compatible with artifact. A few punctate RIGHT cerebrum white matter foci reduced diffusion without identifiable corresponding ADC abnormality. RIGHT frontotemporal and RIGHT basal ganglia encephalomalacia with susceptibility artifact and surrounding gliosis. Ex vacuo dilatation RIGHT lateral ventricle with resultant minimal LEFT-to-RIGHT midline shift. No hydrocephalus. Old small LEFT cerebellar infarct. RIGHT cerebral peduncle volume loss consistent with wallerian degeneration. Patchy to confluent supratentorial pontine white matter FLAIR T2 hyperintensities. No abnormal extra-axial fluid collections. No abnormal intraparenchymal or extra-axial enhancement. VASCULAR: Normal major intracranial vascular flow voids present at skull base. SKULL AND UPPER CERVICAL SPINE: No abnormal sellar expansion. No suspicious calvarial bone marrow signal. Craniocervical junction maintained. Scattered small tricholemmal cysts. SINUSES/ORBITS: Status post LEFT wall up mastoidectomy. RIGHT mastoid effusion.The included ocular globes and orbital contents are non-suspicious. OTHER: None. IMPRESSION: 1. Intraventricular hemorrhage of varying ages.  No hydrocephalus. 2. Subcentimeter acute versus subacute RIGHT cerebrum infarcts, possibly artifact. 3. Old RIGHT MCA territory infarct. 4. Old small  LEFT cerebellar infarct. Moderate chronic small vessel ischemic changes. Electronically Signed   By: Awilda Metro M.D.   On: 03/20/2018 06:15   Dg Chest Portable 1 View  Result Date: 03/18/2018 CLINICAL DATA:  Atrial fibrillation. EXAM: PORTABLE CHEST 1 VIEW COMPARISON:  Jan 08, 2017 FINDINGS: There is no edema or consolidation. Heart is upper normal in size with pulmonary vascularity normal. Patient is status post coronary artery bypass grafting. There are surgical clips over the lateral upper right hemithorax. There is aortic atherosclerosis. No evident bone lesions. IMPRESSION: No edema or consolidation. Stable cardiac silhouette. Status post coronary artery bypass grafting. There is aortic atherosclerosis. Aortic Atherosclerosis (ICD10-I70.0). Electronically Signed   By: Bretta Bang III M.D.   On: 03/18/2018 07:14    DISCHARGE EXAMINATION: Vitals:   04/01/18 0400 04/01/18 0720 04/01/18 0817 04/01/18 1000  BP: (!) 138/108 (!) 167/144  (!) 119/103  Pulse: 86 83 (!) 106 (!) 56  Resp: (!) 26 (!) 24  (!) 23  Temp:  97.9 F (36.6 C)    TempSrc:  Oral    SpO2: 99% 100%  99%  Weight: 72.8 kg (160 lb 7.9 oz)     Height:       General appearance: alert, cooperative, distracted and no distress Resp: clear to auscultation bilaterally Cardio: regular rate and rhythm, S1, S2 normal, no murmur, click, rub or gallop GI: soft, non-tender; bowel sounds normal; no masses,  no organomegaly  DISPOSITION: SNF  Discharge Instructions    Call MD for:  difficulty breathing, headache or visual disturbances   Complete by:  As directed    Call MD for:  extreme fatigue   Complete by:  As directed    Call MD for:  persistant dizziness or light-headedness   Complete by:  As directed    Call MD for:  persistant nausea and vomiting  Complete by:  As directed    Call MD for:  severe uncontrolled pain   Complete by:  As directed    Call MD for:  temperature >100.4   Complete by:  As directed     Discharge instructions   Complete by:  As directed    Please review instructions on the discharge summary.  You were cared for by a hospitalist during your hospital stay. If you have any questions about your discharge medications or the care you received while you were in the hospital after you are discharged, you can call the unit and asked to speak with the hospitalist on call if the hospitalist that took care of you is not available. Once you are discharged, your primary care physician will handle any further medical issues. Please note that NO REFILLS for any discharge medications will be authorized once you are discharged, as it is imperative that you return to your primary care physician (or establish a relationship with a primary care physician if you do not have one) for your aftercare needs so that they can reassess your need for medications and monitor your lab values. If you do not have a primary care physician, you can call [phone] for a physician referral.   Increase activity slowly   Complete by:  As directed          Allergies as of 04/01/2018      Reactions   Avelox [moxifloxacin Hcl In Nacl] Other (See Comments)   Mental breakdown.   Pamelor [nortriptyline Hcl] Other (See Comments)   Made her want to hurt herself   Amoxicillin Hives   Hydrocodone Itching   Oxycodone Itching   Penicillins Hives      Sulfa Antibiotics Other (See Comments)   unknown      Medication List    TAKE these medications   aspirin 81 MG EC tablet Take 1 tablet (81 mg total) by mouth daily.   atorvastatin 40 MG tablet Commonly known as:  LIPITOR Take 1 tablet (40 mg total) by mouth daily at 6 PM.   buPROPion 150 MG 24 hr tablet Commonly known as:  WELLBUTRIN XL Take 1 tablet (150 mg total) by mouth every morning. What changed:    medication strength  how much to take   butalbital-acetaminophen-caffeine 50-325-40 MG tablet Commonly known as:  FIORICET, ESGIC Take 1 tablet by mouth  every 8 (eight) hours as needed for headache.   cloNIDine 0.1 MG tablet Commonly known as:  CATAPRES Take 1 tablet (0.1 mg total) by mouth 2 (two) times daily.   cyanocobalamin 1000 MCG tablet Take 1 tablet (1,000 mcg total) by mouth daily.   diltiazem 120 MG 12 hr capsule Commonly known as:  CARDIZEM SR Take 1 capsule (120 mg total) by mouth 2 (two) times daily. What changed:  when to take this   escitalopram 10 MG tablet Commonly known as:  LEXAPRO Take 1 tablet (10 mg total) by mouth daily.   fluticasone 50 MCG/ACT nasal spray Commonly known as:  FLONASE Place 2 sprays into both nostrils daily.   levothyroxine 200 MCG tablet Commonly known as:  SYNTHROID Take 1 tablet (200 mcg total) by mouth daily before breakfast.   lisinopril 10 MG tablet Commonly known as:  PRINIVIL,ZESTRIL Take 1 tablet (10 mg total) by mouth daily. Start taking on:  04/02/2018 What changed:    medication strength  how much to take  when to take this   magnesium oxide 400 MG tablet Commonly  known as:  MAG-OX Take 1 tablet (400 mg total) by mouth daily.   metoprolol tartrate 25 MG tablet Commonly known as:  LOPRESSOR Take 1 tablet (25 mg total) by mouth 2 (two) times daily.   PROAIR HFA 108 (90 Base) MCG/ACT inhaler Generic drug:  albuterol INHALE 2 PUFFS BY MOUTH EVERY 6 HOURS AS NEEDED FOR SHORTNESS OF BREATH   ranitidine 150 MG tablet Commonly known as:  ZANTAC Take 1 tablet (150 mg total) by mouth 2 (two) times daily. What changed:    when to take this  reasons to take this   topiramate 50 MG tablet Commonly known as:  TOPAMAX Take 1 tablet (50 mg total) by mouth 2 (two) times daily.         Contact information for follow-up providers    Shon Millet R, DO Follow up.   Specialty:  Neurology Why:  on 04/22/18 at 2:50PM Contact information: [address] [place] [address] [phone]            Contact information for after-discharge care      Destination    [facility] Preferred SNF .   Service:  Skilled Nursing Contact information: [address] [place] [address] [phone]                  TOTAL DISCHARGE TIME: 35 mins  Osvaldo Shipper  [facility] Pager [phone]  04/01/2018, 10:44 AM

## 2018-04-01 NOTE — Progress Notes (Signed)
Report called to receiving nurse, Charlene at [facility]. Per request of receiving facility, foley catheter to be d/c'd prior to transport. Rito Ehrlich, MD aware and ok with plan to d/c foley. This RN will call receiving nurse with time of foley d/c to monitor for urinary output. PIV removed without complications. Patient discharged to [facility] with [facility].  Curtis Sites, RN

## 2018-04-01 NOTE — Progress Notes (Signed)
Physical Therapy Treatment Patient Details Name: [name] MRN: [id number] DOB: April 14, 1956 Today's Date: 04/01/2018    History of Present Illness Pt is a 62 y.o. female admitted from SNF on 03/25/18 with hypertensive urgency and fluid overload. Of note, recent admit for R MCA infarct with IVH on 03/19/18 with d/c to SNF on 03/24/18. PMH includes CAD, HF, HTN, a-fib (not on anticoagulation), R MCA infarct (2012).    PT Comments    Pt continues to have very flat affect with depressed mood/spirits. Pt with no appetite and limited engagement in conversation and participation in PT. Pt able to tolerate amb of 100' with RW however required assist for walker management around obstacles and turning. Pt with constant vearing to the R. Pt HR in high 140s, RN aware. RN reports she refused her medications last night. Acute PT to cont. To follow.    Follow Up Recommendations  SNF;Supervision/Assistance - 24 hour     Equipment Recommendations  Rolling walker with 5" wheels    Recommendations for Other Services       Precautions / Restrictions Precautions Precautions: Fall Restrictions Weight Bearing Restrictions: No    Mobility  Bed Mobility Overal bed mobility: Needs Assistance Bed Mobility: Supine to Sit     Supine to sit: Min assist     General bed mobility comments: max directional verbal and tactile cues. pt extremely slow to move/process, minA for trunk elevation   Transfers Overall transfer level: Needs assistance Equipment used: None Transfers: Sit to/from Stand Sit to Stand: Min assist         General transfer comment: max tactile and verbal directional cues. max encouragement  Ambulation/Gait Ambulation/Gait assistance: Min assist Gait Distance (Feet): 100 Feet Assistive device: Rolling walker (2 wheeled) Gait Pattern/deviations: Step-through pattern;Decreased stride length;Staggering right;Trunk flexed Gait velocity: dec Gait velocity interpretation: <1.8  ft/sec, indicate of risk for recurrent falls General Gait Details: pt veers right requiring assist around obstacles as patient with minimal problem solving in going around them. Pt extremely slow requiring minA for continuous pushing of walker forward to maintian forward momentum. Pt with no episode of LOB however poor directional, walker management skills   Stairs             Wheelchair Mobility    Modified Rankin (Stroke Patients Only)       Balance Overall balance assessment: Needs assistance Sitting-balance support: No upper extremity supported;Feet supported Sitting balance-Leahy Scale: Fair Sitting balance - Comments: sitting without LOB with no UE support   Standing balance support: Bilateral upper extremity supported Standing balance-Leahy Scale: Poor Standing balance comment: UE support for balance                            Cognition Arousal/Alertness: Awake/alert Behavior During Therapy: Flat affect Overall Cognitive Status: No family/caregiver present to determine baseline cognitive functioning Area of Impairment: Attention;Memory;Following commands;Safety/judgement;Awareness;Problem solving                   Current Attention Level: Sustained   Following Commands: Follows one step commands with increased time   Awareness: Emergent Problem Solving: Slow processing;Decreased initiation;Difficulty sequencing;Requires verbal cues;Requires tactile cues General Comments: pt with extremely flat affect, limited conversation. Pt did report she was at home prior to coming ot the hospital however pt was at SNF due to stroke earlier this month      Exercises      General Comments  Pertinent Vitals/Pain Pain Assessment: No/denies pain    Home Living                      Prior Function            PT Goals (current goals can now be found in the care plan section) Progress towards PT goals: Progressing toward goals     Frequency    Min 3X/week      PT Plan Current plan remains appropriate    Co-evaluation              AM-PAC PT "6 Clicks" Daily Activity  Outcome Measure  Difficulty turning over in bed (including adjusting bedclothes, sheets and blankets)?: None Difficulty moving from lying on back to sitting on the side of the bed? : Unable Difficulty sitting down on and standing up from a chair with arms (e.g., wheelchair, bedside commode, etc,.)?: Unable Help needed moving to and from a bed to chair (including a wheelchair)?: A Little Help needed walking in hospital room?: A Little Help needed climbing 3-5 steps with a railing? : A Lot 6 Click Score: 14    End of Session Equipment Utilized During Treatment: Gait belt Activity Tolerance: Patient tolerated treatment well Patient left: in chair;with call bell/phone within reach;with chair alarm set Nurse Communication: Mobility status PT Visit Diagnosis: Other abnormalities of gait and mobility (R26.89);Other symptoms and signs involving the nervous system (R29.898);Muscle weakness (generalized) (M62.81)     Time: 1856-3149 PT Time Calculation (min) (ACUTE ONLY): 25 min  Charges:  $Gait Training: 8-22 mins $Therapeutic Activity: 8-22 mins                    G Codes:       Lewis Shock, PT, DPT Pager #: [phone] Office #: [phone]    Delyla Sandeen M Terriah Reggio 04/01/2018, 10:27 AM

## 2018-04-01 NOTE — Clinical Social Work Note (Signed)
CSW facilitated patient discharge including contacting patient family (Son) and facility to confirm patient discharge plans. Clinical information faxed to facility and family agreeable with plan. CSW arranged ambulance transport via [facility] to [facility]. RN to call report prior to discharge [phone]).  CSW will sign off for now as social work intervention is no longer needed. Please consult [place] again if new needs arise.  Charlynn Court, CSW [phone]

## 2018-04-01 NOTE — Clinical Social Work Note (Signed)
Authorization approved for discharge to [facility] SNF today: [phone]. CSW paged MD to notify.  Charlynn Court, CSW [phone]

## 2018-04-03 DIAGNOSIS — I481 Persistent atrial fibrillation: Secondary | ICD-10-CM | POA: Diagnosis not present

## 2018-04-03 DIAGNOSIS — I25810 Atherosclerosis of coronary artery bypass graft(s) without angina pectoris: Secondary | ICD-10-CM | POA: Diagnosis not present

## 2018-04-03 DIAGNOSIS — I629 Nontraumatic intracranial hemorrhage, unspecified: Secondary | ICD-10-CM | POA: Diagnosis not present

## 2018-04-03 DIAGNOSIS — I10 Essential (primary) hypertension: Secondary | ICD-10-CM | POA: Diagnosis not present

## 2018-04-06 ENCOUNTER — Other Ambulatory Visit: Payer: Self-pay | Admitting: *Deleted

## 2018-04-06 ENCOUNTER — Other Ambulatory Visit (HOSPITAL_BASED_OUTPATIENT_CLINIC_OR_DEPARTMENT_OTHER): Payer: Self-pay

## 2018-04-06 DIAGNOSIS — R51 Headache: Secondary | ICD-10-CM | POA: Diagnosis not present

## 2018-04-06 DIAGNOSIS — G4710 Hypersomnia, unspecified: Secondary | ICD-10-CM

## 2018-04-06 DIAGNOSIS — R630 Anorexia: Secondary | ICD-10-CM | POA: Diagnosis not present

## 2018-04-06 DIAGNOSIS — F332 Major depressive disorder, recurrent severe without psychotic features: Secondary | ICD-10-CM | POA: Diagnosis not present

## 2018-04-06 DIAGNOSIS — I6990 Unspecified sequelae of unspecified cerebrovascular disease: Secondary | ICD-10-CM | POA: Diagnosis not present

## 2018-04-06 NOTE — Patient Outreach (Signed)
[facility]) Care Management  04/06/2018  JOZEY SCHOOLER 01/04/1956 [id number]   Referral from hospital liaison that patient had admitted to [facility] healthcare.  Onsite visit with discharge planner reveals that patient may need LTC or ALF.  The hope is that patient will progress and be able to go to ALF upon discharge. Casimiro Needle is working with patient son on discharge plan.   Will collaborate with [facility] and monitor for any [facility] community case management discharge needs.   Alben Spittle. Albertha Ghee, RN, BSN, CCM  Post Acute Chartered loss adjuster [phone]) Business Cell  [phone]) Toll Free Office

## 2018-04-07 ENCOUNTER — Encounter: Payer: Self-pay | Admitting: Neurology

## 2018-04-08 ENCOUNTER — Ambulatory Visit (HOSPITAL_COMMUNITY): Payer: PPO | Admitting: Licensed Clinical Social Worker

## 2018-04-09 DIAGNOSIS — R51 Headache: Secondary | ICD-10-CM | POA: Diagnosis not present

## 2018-04-09 DIAGNOSIS — R2681 Unsteadiness on feet: Secondary | ICD-10-CM

## 2018-04-09 DIAGNOSIS — E559 Vitamin D deficiency, unspecified: Secondary | ICD-10-CM | POA: Diagnosis not present

## 2018-04-09 DIAGNOSIS — R630 Anorexia: Secondary | ICD-10-CM | POA: Diagnosis not present

## 2018-04-09 DIAGNOSIS — R5383 Other fatigue: Secondary | ICD-10-CM | POA: Diagnosis not present

## 2018-04-13 DIAGNOSIS — R51 Headache: Secondary | ICD-10-CM | POA: Diagnosis not present

## 2018-04-13 DIAGNOSIS — R630 Anorexia: Secondary | ICD-10-CM | POA: Diagnosis not present

## 2018-04-13 DIAGNOSIS — F332 Major depressive disorder, recurrent severe without psychotic features: Secondary | ICD-10-CM | POA: Diagnosis not present

## 2018-04-14 ENCOUNTER — Other Ambulatory Visit: Payer: Self-pay | Admitting: *Deleted

## 2018-04-14 ENCOUNTER — Encounter (HOSPITAL_COMMUNITY): Payer: Self-pay | Admitting: Emergency Medicine

## 2018-04-14 ENCOUNTER — Inpatient Hospital Stay (HOSPITAL_COMMUNITY)
Admission: EM | Admit: 2018-04-14 | Discharge: 2018-04-27 | DRG: 682 | Disposition: A | Discharge status: Skilled Nursing Facility | Payer: PPO | Source: Skilled Nursing Facility | Attending: Family Medicine | Admitting: Family Medicine

## 2018-04-14 DIAGNOSIS — I480 Paroxysmal atrial fibrillation: Secondary | ICD-10-CM | POA: Diagnosis not present

## 2018-04-14 DIAGNOSIS — I482 Chronic atrial fibrillation, unspecified: Secondary | ICD-10-CM | POA: Diagnosis present

## 2018-04-14 DIAGNOSIS — I10 Essential (primary) hypertension: Secondary | ICD-10-CM | POA: Diagnosis present

## 2018-04-14 DIAGNOSIS — I959 Hypotension, unspecified: Secondary | ICD-10-CM | POA: Diagnosis present

## 2018-04-14 DIAGNOSIS — N3090 Cystitis, unspecified without hematuria: Secondary | ICD-10-CM | POA: Diagnosis not present

## 2018-04-14 DIAGNOSIS — Z008 Encounter for other general examination: Secondary | ICD-10-CM

## 2018-04-14 DIAGNOSIS — F17200 Nicotine dependence, unspecified, uncomplicated: Secondary | ICD-10-CM | POA: Diagnosis present

## 2018-04-14 DIAGNOSIS — F32A Depression, unspecified: Secondary | ICD-10-CM | POA: Diagnosis present

## 2018-04-14 DIAGNOSIS — Z880 Allergy status to penicillin: Secondary | ICD-10-CM

## 2018-04-14 DIAGNOSIS — W19XXXA Unspecified fall, initial encounter: Secondary | ICD-10-CM | POA: Diagnosis present

## 2018-04-14 DIAGNOSIS — Z7401 Bed confinement status: Secondary | ICD-10-CM | POA: Diagnosis not present

## 2018-04-14 DIAGNOSIS — G4733 Obstructive sleep apnea (adult) (pediatric): Secondary | ICD-10-CM | POA: Diagnosis not present

## 2018-04-14 DIAGNOSIS — E869 Volume depletion, unspecified: Secondary | ICD-10-CM | POA: Diagnosis not present

## 2018-04-14 DIAGNOSIS — I5032 Chronic diastolic (congestive) heart failure: Secondary | ICD-10-CM | POA: Diagnosis present

## 2018-04-14 DIAGNOSIS — F0390 Unspecified dementia without behavioral disturbance: Secondary | ICD-10-CM | POA: Diagnosis present

## 2018-04-14 DIAGNOSIS — G9341 Metabolic encephalopathy: Secondary | ICD-10-CM | POA: Diagnosis present

## 2018-04-14 DIAGNOSIS — J449 Chronic obstructive pulmonary disease, unspecified: Secondary | ICD-10-CM | POA: Diagnosis not present

## 2018-04-14 DIAGNOSIS — R29810 Facial weakness: Secondary | ICD-10-CM | POA: Diagnosis not present

## 2018-04-14 DIAGNOSIS — I69354 Hemiplegia and hemiparesis following cerebral infarction affecting left non-dominant side: Secondary | ICD-10-CM | POA: Diagnosis not present

## 2018-04-14 DIAGNOSIS — R410 Disorientation, unspecified: Secondary | ICD-10-CM | POA: Diagnosis not present

## 2018-04-14 DIAGNOSIS — Z951 Presence of aortocoronary bypass graft: Secondary | ICD-10-CM

## 2018-04-14 DIAGNOSIS — G9340 Encephalopathy, unspecified: Secondary | ICD-10-CM | POA: Diagnosis not present

## 2018-04-14 DIAGNOSIS — Z8249 Family history of ischemic heart disease and other diseases of the circulatory system: Secondary | ICD-10-CM | POA: Diagnosis not present

## 2018-04-14 DIAGNOSIS — Z720 Tobacco use: Secondary | ICD-10-CM | POA: Diagnosis not present

## 2018-04-14 DIAGNOSIS — R4182 Altered mental status, unspecified: Secondary | ICD-10-CM | POA: Diagnosis not present

## 2018-04-14 DIAGNOSIS — E785 Hyperlipidemia, unspecified: Secondary | ICD-10-CM | POA: Diagnosis not present

## 2018-04-14 DIAGNOSIS — E039 Hypothyroidism, unspecified: Secondary | ICD-10-CM | POA: Diagnosis present

## 2018-04-14 DIAGNOSIS — R05 Cough: Secondary | ICD-10-CM | POA: Diagnosis not present

## 2018-04-14 DIAGNOSIS — Z836 Family history of other diseases of the respiratory system: Secondary | ICD-10-CM

## 2018-04-14 DIAGNOSIS — I639 Cerebral infarction, unspecified: Secondary | ICD-10-CM | POA: Diagnosis present

## 2018-04-14 DIAGNOSIS — R4781 Slurred speech: Secondary | ICD-10-CM | POA: Diagnosis not present

## 2018-04-14 DIAGNOSIS — I615 Nontraumatic intracerebral hemorrhage, intraventricular: Secondary | ICD-10-CM

## 2018-04-14 DIAGNOSIS — Z8614 Personal history of Methicillin resistant Staphylococcus aureus infection: Secondary | ICD-10-CM

## 2018-04-14 DIAGNOSIS — E860 Dehydration: Secondary | ICD-10-CM | POA: Diagnosis present

## 2018-04-14 DIAGNOSIS — R0989 Other specified symptoms and signs involving the circulatory and respiratory systems: Secondary | ICD-10-CM | POA: Diagnosis not present

## 2018-04-14 DIAGNOSIS — R0902 Hypoxemia: Secondary | ICD-10-CM | POA: Diagnosis not present

## 2018-04-14 DIAGNOSIS — E876 Hypokalemia: Secondary | ICD-10-CM | POA: Diagnosis present

## 2018-04-14 DIAGNOSIS — A419 Sepsis, unspecified organism: Secondary | ICD-10-CM | POA: Diagnosis present

## 2018-04-14 DIAGNOSIS — I481 Persistent atrial fibrillation: Secondary | ICD-10-CM | POA: Diagnosis present

## 2018-04-14 DIAGNOSIS — K219 Gastro-esophageal reflux disease without esophagitis: Secondary | ICD-10-CM | POA: Diagnosis not present

## 2018-04-14 DIAGNOSIS — R0602 Shortness of breath: Secondary | ICD-10-CM | POA: Diagnosis not present

## 2018-04-14 DIAGNOSIS — Z8262 Family history of osteoporosis: Secondary | ICD-10-CM

## 2018-04-14 DIAGNOSIS — R230 Cyanosis: Secondary | ICD-10-CM | POA: Diagnosis not present

## 2018-04-14 DIAGNOSIS — R6510 Systemic inflammatory response syndrome (SIRS) of non-infectious origin without acute organ dysfunction: Secondary | ICD-10-CM | POA: Diagnosis not present

## 2018-04-14 DIAGNOSIS — I4891 Unspecified atrial fibrillation: Secondary | ICD-10-CM | POA: Diagnosis not present

## 2018-04-14 DIAGNOSIS — I110 Hypertensive heart disease with heart failure: Secondary | ICD-10-CM | POA: Diagnosis not present

## 2018-04-14 DIAGNOSIS — Z833 Family history of diabetes mellitus: Secondary | ICD-10-CM

## 2018-04-14 DIAGNOSIS — I2510 Atherosclerotic heart disease of native coronary artery without angina pectoris: Secondary | ICD-10-CM | POA: Diagnosis present

## 2018-04-14 DIAGNOSIS — R29818 Other symptoms and signs involving the nervous system: Secondary | ICD-10-CM | POA: Diagnosis not present

## 2018-04-14 DIAGNOSIS — Z885 Allergy status to narcotic agent status: Secondary | ICD-10-CM

## 2018-04-14 DIAGNOSIS — I25709 Atherosclerosis of coronary artery bypass graft(s), unspecified, with unspecified angina pectoris: Secondary | ICD-10-CM | POA: Diagnosis not present

## 2018-04-14 DIAGNOSIS — N390 Urinary tract infection, site not specified: Secondary | ICD-10-CM | POA: Diagnosis present

## 2018-04-14 DIAGNOSIS — R531 Weakness: Secondary | ICD-10-CM | POA: Diagnosis not present

## 2018-04-14 DIAGNOSIS — R630 Anorexia: Secondary | ICD-10-CM | POA: Diagnosis not present

## 2018-04-14 DIAGNOSIS — Z7982 Long term (current) use of aspirin: Secondary | ICD-10-CM

## 2018-04-14 DIAGNOSIS — F17210 Nicotine dependence, cigarettes, uncomplicated: Secondary | ICD-10-CM | POA: Diagnosis not present

## 2018-04-14 DIAGNOSIS — F329 Major depressive disorder, single episode, unspecified: Secondary | ICD-10-CM | POA: Diagnosis present

## 2018-04-14 DIAGNOSIS — N3000 Acute cystitis without hematuria: Secondary | ICD-10-CM | POA: Diagnosis not present

## 2018-04-14 DIAGNOSIS — N179 Acute kidney failure, unspecified: Secondary | ICD-10-CM | POA: Diagnosis not present

## 2018-04-14 DIAGNOSIS — M2550 Pain in unspecified joint: Secondary | ICD-10-CM | POA: Diagnosis not present

## 2018-04-14 DIAGNOSIS — Z79890 Hormone replacement therapy (postmenopausal): Secondary | ICD-10-CM

## 2018-04-14 LAB — CBG MONITORING, ED: GLUCOSE-CAPILLARY: 81 mg/dL (ref 70–99)

## 2018-04-14 MED ORDER — NALOXONE HCL 2 MG/2ML IJ SOSY
PREFILLED_SYRINGE | INTRAMUSCULAR | Status: AC
  Administered 2018-04-14: 2 mg
  Filled 2018-04-14: qty 2

## 2018-04-14 NOTE — Patient Outreach (Signed)
[facility]) Care Management  04/14/2018  [name] March 17, 1956 [id number]  Met with Elmer Ramp, dcp at facilty. They report that patient does not have a discharge date, they are going to set up a care plan meeting with the son to discuss a plan B for patient.   RNCM discussed that patient would qualify for [facility] care management if she goes home from facility.  RNCM will continue to monitor for [facility] care management needs. Will collaborate with [facility]. Royetta Crochet. Laymond Purser, RN, BSN, [place] [phone]) Business Cell  [phone]) Toll Free Office

## 2018-04-14 NOTE — ED Provider Notes (Signed)
[facility] EMERGENCY DEPARTMENT Provider Note   CSN: [id number] Arrival date & time: 04/14/18  2340     History   Chief Complaint Chief Complaint  Patient presents with  . Altered Mental Status    HPI [name] is a 62 y.o. female.  The history is provided by the EMS personnel. The history is limited by the condition of the patient.  Altered Mental Status   This is a recurrent problem. The current episode started 3 to 5 hours ago. The problem has not changed since onset.Associated symptoms include confusion and weakness. Pertinent negatives include no seizures. Risk factors include a recent illness. Her past medical history is significant for CVA, head trauma and heart disease.    Past Medical History:  Diagnosis Date  . Acute right MCA stroke (HCC) 11/07/10  . Atrial fibrillation (HCC)    a. s/p TEE-DCCV 02/2104; b. Xarelto started  . CAD (coronary artery disease)    a.  cath 09/2010: LAD stent patent, S-Int/dCFX ok, S-PDA ok, L-LAD atretic;  b. Lexiscan Myoview (02/2014):  no ischemia, EF 55%; c. 11/2014 Cath/PCI: LM nl, LAD 20pISR, LCX 80-19m, OM1 nl, RI 70p, RCA 40-53m, RPDA 95ost/95-63m (PTCA only w/ reduction to 50p/62m), 60d, L->LAD atretic, VG->RI->OM nl, VG->PDA 100p.  . Cardiomyopathy with EF 40% at TEE 02/17/14 (likely tachycardia mediated - Myoview 02/19/14 neg for ischemia with normal EF) 02/18/2014  . COPD (chronic obstructive pulmonary disease) (HCC)   . Depression   . Eczema   . GERD (gastroesophageal reflux disease)   . Headache   . HLD (hyperlipidemia)   . Homelessness 11/12/2011  . 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 Hx of CABG   . Hx of transesophageal echocardiography (TEE) for monitoring 11/2010   TEE 11/2010: EF 60-65%, BAE, trivial atrial septal shunt;  right heart cath in 10/2010 with elevated R and L heart pressures and diuretic started  .  Hypertension   . Hypothyroidism   . Persistent atrial fibrillation (HCC) 10/2014  . Pulmonary nodules    repeat CT due in 11/2011  . Sleep apnea    recent sleep study  in 04/2014 per chart review  shows no significant OSA    Patient Active Problem List   Diagnosis Date Noted  . Hypertensive emergency 03/25/2018  . NSTEMI (non-ST elevated myocardial infarction) (HCC)   . Troponin level elevated   . IVH (intraventricular hemorrhage) (HCC) 03/19/2018  . 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 Long term current use of amiodarone 03/27/2017  . Severe recurrent major depression without psychotic features (HCC) 11/19/2016    Class: Chronic  . Pre-procedure lab exam 09/13/2016  . Generalized headaches 07/04/2016  . Protein-calorie malnutrition, severe (HCC) 04/03/2015  . Chronic diastolic CHF (congestive heart failure), NYHA class 2 (HCC) 01/24/2015  . Demand ischemia secondary to AF with RVR 12/13/2014  . CKD (chronic kidney disease), stage III (HCC) 11/11/2014  . Hypokalemia 05/22/2014  . Mood disorder (HCC) 03/20/2014  . HTN (hypertension) 03/20/2014  . Anemia, unspecified 03/20/2014  . Smoker 03/20/2014  . Atrial fibrillation with RVR (HCC) 03/03/2014  . Hypotension 03/03/2014  . Pulmonary hypertension (HCC) 03/03/2014  . COPD (chronic obstructive pulmonary disease) (HCC) 03/03/2014  . H/O: CVA (cerebrovascular accident)   . Long-term (current) use of anticoagulants   . Cardiomyopathy-h/o tachycardia mediated-EF 65% per echo March 2016 02/18/2014  .  Hyperlipidemia 02/18/2014  . CAD '07, LAD PCI 2012, SVG-PDA PTCA 11/10/14 02/16/2014  . Chronic diastolic heart failure (HCC)   . Pulmonary nodules   . Hypothyroidism   . OSA- C-pap intol     Past Surgical History:  Procedure Laterality Date  . bilateral knee surgery    . BLADDER SURGERY    . CARDIOVERSION N/A 02/17/2014   Procedure: CARDIOVERSION;  Surgeon: Pricilla Riffle, MD;  Location[facility] ENDOSCOPY;  Service: Cardiovascular;   Laterality: N/A;  . CARDIOVERSION N/A 09/20/2016   Procedure: CARDIOVERSION;  Surgeon: Lars Masson, MD;  Location: [facility] ENDOSCOPY;  Service: Cardiovascular;  Laterality: N/A;  . cath 2012    . CHEST WALL RECONSTRUCTION    . CORONARY ARTERY BYPASS GRAFT    . debriment for infection in chest    . HERNIA REPAIR    . LEFT AND RIGHT HEART CATHETERIZATION WITH CORONARY ANGIOGRAM N/A 11/10/2014   Procedure: LEFT AND RIGHT HEART CATHETERIZATION WITH CORONARY ANGIOGRAM;  Surgeon: Marykay Lex, MD;  Location: [facility] CATH LAB;  Service: Cardiovascular;  Laterality: N/A;  . Left mastoidectomy    . TEE WITHOUT CARDIOVERSION N/A 02/17/2014   Procedure: TRANSESOPHAGEAL ECHOCARDIOGRAM (TEE);  Surgeon: Pricilla Riffle, MD;  Location: [facility] ENDOSCOPY;  Service: Cardiovascular;  Laterality: N/A;     OB History   None      Home Medications    Prior to Admission medications   Medication Sig Start Date End Date Taking? Authorizing Provider  aspirin EC 81 MG EC tablet Take 1 tablet (81 mg total) by mouth daily. 03/25/18   Layne Benton, NP  atorvastatin (LIPITOR) 40 MG tablet Take 1 tablet (40 mg total) by mouth daily at 6 PM. 03/24/18   Layne Benton, NP  buPROPion (WELLBUTRIN XL) 150 MG 24 hr tablet Take 1 tablet (150 mg total) by mouth every morning. 04/01/18   Osvaldo Shipper, MD  butalbital-acetaminophen-caffeine (FIORICET, ESGIC) [phone] MG tablet Take 1 tablet by mouth every 8 (eight) hours as needed for headache. 03/24/18   Layne Benton, NP  cloNIDine (CATAPRES) 0.1 MG tablet Take 1 tablet (0.1 mg total) by mouth 2 (two) times daily. 04/01/18   Osvaldo Shipper, MD  diltiazem (CARDIZEM SR) 120 MG 12 hr capsule Take 1 capsule (120 mg total) by mouth 2 (two) times daily. 04/01/18   Osvaldo Shipper, MD  escitalopram (LEXAPRO) 10 MG tablet Take 1 tablet (10 mg total) by mouth daily. 10/29/17 10/29/18  Plovsky, Earvin Hansen, MD  fluticasone (FLONASE) 50 MCG/ACT nasal spray Place 2 sprays into both nostrils daily.  08/15/16   Panosh, Neta Mends, MD  levothyroxine (SYNTHROID, LEVOTHROID) 200 MCG tablet Take 1 tablet (200 mcg total) by mouth daily before breakfast. 03/25/18   Layne Benton, NP  lisinopril (PRINIVIL,ZESTRIL) 10 MG tablet Take 1 tablet (10 mg total) by mouth daily. 04/02/18   Osvaldo Shipper, MD  magnesium oxide (MAG-OX) 400 MG tablet Take 1 tablet (400 mg total) by mouth daily. 09/17/16   Lars Masson, MD  metoprolol tartrate (LOPRESSOR) 25 MG tablet Take 1 tablet (25 mg total) by mouth 2 (two) times daily. 04/01/18   Osvaldo Shipper, MD  PROAIR HFA 108 [phone] Base) MCG/ACT inhaler INHALE 2 PUFFS BY MOUTH EVERY 6 HOURS AS NEEDED FOR SHORTNESS OF BREATH 02/03/18   Leslye Peer, MD  ranitidine (ZANTAC) 150 MG tablet Take 1 tablet (150 mg total) by mouth 2 (two) times daily. Patient taking differently: Take 150 mg by mouth 2 (two)  times daily as needed for heartburn.  04/16/17   Panosh, Neta Mends, MD  topiramate (TOPAMAX) 50 MG tablet Take 1 tablet (50 mg total) by mouth 2 (two) times daily. 10/13/17   Micki Riley, MD  vitamin B-12 1000 MCG tablet Take 1 tablet (1,000 mcg total) by mouth daily. 03/25/18   Layne Benton, NP    Family History Family History  Problem Relation Age of Onset  . COPD Mother   . Heart disease Mother   . Arthritis Mother        Rheumatoid and PMR  . Osteoporosis Mother        Mom fractured hip  . Diabetes type II Mother   . Depression Mother   . Heart attack Father   . Depression Father   . Hypertension Father   . Alcohol abuse Father   . Depression Sister   . Anxiety disorder Sister   . Drug abuse Sister   . Stroke Neg Hx     Social History Social History   Tobacco Use  . Smoking status: Current Every Day Smoker    Packs/day: 0.50    Years: 30.00    Pack years: 15.00    Types: Cigarettes    Last attempt to quit: 09/10/2014    Years since quitting: 3.5  . Smokeless tobacco: Never Used  Substance Use Topics  . Alcohol use: No    Alcohol/week: 0.0 oz  .  Drug use: No     Allergies   Avelox [moxifloxacin hcl in nacl]; Pamelor [nortriptyline hcl]; Amoxicillin; Hydrocodone; Oxycodone; Penicillins; and Sulfa antibiotics   Review of Systems Review of Systems  Unable to perform ROS: Acuity of condition  Skin: Positive for color change.  Neurological: Positive for weakness. Negative for seizures.  Psychiatric/Behavioral: Positive for confusion.     Physical Exam Updated Vital Signs BP (!) 109/91 (BP Location: Left Arm)   Pulse 73   Temp (!) 97 F (36.1 C) (Oral)   Resp (!) 22   SpO2 100%   Physical Exam  Constitutional: She appears well-developed and well-nourished.  HENT:  Head: Normocephalic and atraumatic.  Mouth/Throat: No oropharyngeal exudate.  Eyes: Pupils are equal, round, and reactive to light. Conjunctivae are normal.  Neck: Normal range of motion. Neck supple. No JVD present. No tracheal deviation present.  Cardiovascular: Normal rate and intact distal pulses. An irregularly irregular rhythm present.  Pulmonary/Chest: She has no wheezes. She has no rales.  Abdominal: Soft. Bowel sounds are normal. She exhibits no mass. There is no tenderness. There is no rebound and no guarding.  Musculoskeletal: She exhibits no edema.  Neurological: She is alert.  Skin: Capillary refill takes less than 2 seconds. She is not diaphoretic.  Psychiatric:  Livido of the extremities      ED Treatments / Results  Labs (all labs ordered are listed, but only abnormal results are displayed) Labs Reviewed  PROTIME-INR  APTT  CBC  DIFFERENTIAL  COMPREHENSIVE METABOLIC PANEL  TSH  CBG MONITORING, ED  I-STAT TROPONIN, ED  I-STAT CHEM 8, ED  I-STAT BETA HCG BLOOD, ED ([facility], [facility], [facility] ONLY)  CBG MONITORING, ED    EKG None  Radiology No results found.  Procedures Procedures (including critical care time)  Medications Ordered in ED Medications  aztreonam (AZACTAM) 2 g in sodium chloride 0.9 % 100 mL IVPB (2 g Intravenous New  Bag/Given 04/15/18 0220)  vancomycin (VANCOCIN) 1,750 mg in sodium chloride 0.9 % 500 mL IVPB (1,750 mg Intravenous New  Bag/Given 04/15/18 0231)  aztreonam (AZACTAM) 1 g in sodium chloride 0.9 % 100 mL IVPB (has no administration in time range)  vancomycin (VANCOCIN) IVPB 750 mg/150 ml premix (has no administration in time range)  iopamidol (ISOVUE-370) 76 % injection (has no administration in time range)  naloxone [facility]) 2 MG/2ML injection (2 mg  Given 04/14/18 2351)  naloxone (NARCAN) injection 2 mg (2 mg Intravenous Given 04/14/18 2351)  sodium chloride 0.9 % bolus 1,000 mL (0 mLs Intravenous Stopped 04/15/18 0231)  hydrocortisone sodium succinate (SOLU-CORTEF) 100 MG injection 100 mg (100 mg Intravenous Given 04/15/18 0229)   MDM Reviewed: previous chart, nursing note and vitals Reviewed previous: labs, ECG and x-ray Interpretation: labs, ECG and x-ray (troponin negative Vascular congestion on CXR by me, no new bleed on CT by me UTI on urine) Total time providing critical care: > 105 minutes. This excludes time spent performing separately reportable procedures and services. Consults: admitting MD and neurology  CRITICAL CARE Performed by: Jasmine Awe Total critical care time: 120  minutes Critical care time was exclusive of separately billable procedures and treating other patients. Critical care was necessary to treat or prevent imminent or life-threatening deterioration. Critical care was time spent personally by me on the following activities: development of treatment plan with patient and/or surrogate as well as nursing, discussions with consultants, evaluation of patient's response to treatment, examination of patient, obtaining history from patient or surrogate, ordering and performing treatments and interventions, ordering and review of laboratory studies, ordering and review of radiographic studies, pulse oximetry and re-evaluation of patient's condition.  Final Clinical  Impressions(s) / ED Diagnoses   Medication effect and SIRS from UTI, hold BP medication.  Not given a full 30 cc/KG bolus due CHF history and vascular congestion on CXR.  Will reverse beta blocker    Keiden Deskin, MD 04/15/18 0246

## 2018-04-14 NOTE — ED Triage Notes (Signed)
Pt arrives via EMS from skilled nursing facility, had an unwitnessed fall at approx 2215. No noted injury, moved to chair. SNF noted hypotension at that time so they moved pt to bed and called EMS. EMS noted pt is cyantoc and unable to get a oxygen saturation on pt. NRB applied at 15L. Pt noted to have L sided facial droop and slurred speech, SNF staff states this is new so code stroke activated.  LSW 2220.  CBG upon arrival 65. Pt remains cyanotic upon arrival to ED.

## 2018-04-15 ENCOUNTER — Other Ambulatory Visit (HOSPITAL_COMMUNITY): Payer: Self-pay

## 2018-04-15 ENCOUNTER — Emergency Department (HOSPITAL_COMMUNITY): Payer: PPO

## 2018-04-15 ENCOUNTER — Encounter (HOSPITAL_COMMUNITY): Payer: Self-pay | Admitting: Radiology

## 2018-04-15 ENCOUNTER — Inpatient Hospital Stay (HOSPITAL_COMMUNITY): Payer: PPO

## 2018-04-15 DIAGNOSIS — E039 Hypothyroidism, unspecified: Secondary | ICD-10-CM | POA: Diagnosis present

## 2018-04-15 DIAGNOSIS — N3000 Acute cystitis without hematuria: Secondary | ICD-10-CM | POA: Diagnosis not present

## 2018-04-15 DIAGNOSIS — R4182 Altered mental status, unspecified: Secondary | ICD-10-CM | POA: Diagnosis not present

## 2018-04-15 DIAGNOSIS — N390 Urinary tract infection, site not specified: Secondary | ICD-10-CM | POA: Diagnosis present

## 2018-04-15 DIAGNOSIS — G9340 Encephalopathy, unspecified: Secondary | ICD-10-CM | POA: Diagnosis not present

## 2018-04-15 DIAGNOSIS — Z008 Encounter for other general examination: Secondary | ICD-10-CM | POA: Diagnosis not present

## 2018-04-15 DIAGNOSIS — K219 Gastro-esophageal reflux disease without esophagitis: Secondary | ICD-10-CM | POA: Diagnosis present

## 2018-04-15 DIAGNOSIS — I481 Persistent atrial fibrillation: Secondary | ICD-10-CM | POA: Diagnosis present

## 2018-04-15 DIAGNOSIS — I2510 Atherosclerotic heart disease of native coronary artery without angina pectoris: Secondary | ICD-10-CM | POA: Diagnosis present

## 2018-04-15 DIAGNOSIS — F329 Major depressive disorder, single episode, unspecified: Secondary | ICD-10-CM | POA: Diagnosis present

## 2018-04-15 DIAGNOSIS — Z836 Family history of other diseases of the respiratory system: Secondary | ICD-10-CM | POA: Diagnosis not present

## 2018-04-15 DIAGNOSIS — W19XXXA Unspecified fall, initial encounter: Secondary | ICD-10-CM | POA: Diagnosis present

## 2018-04-15 DIAGNOSIS — G9341 Metabolic encephalopathy: Secondary | ICD-10-CM | POA: Diagnosis present

## 2018-04-15 DIAGNOSIS — Z8249 Family history of ischemic heart disease and other diseases of the circulatory system: Secondary | ICD-10-CM | POA: Diagnosis not present

## 2018-04-15 DIAGNOSIS — N3090 Cystitis, unspecified without hematuria: Secondary | ICD-10-CM | POA: Diagnosis present

## 2018-04-15 DIAGNOSIS — F32A Depression, unspecified: Secondary | ICD-10-CM | POA: Diagnosis present

## 2018-04-15 DIAGNOSIS — I69354 Hemiplegia and hemiparesis following cerebral infarction affecting left non-dominant side: Secondary | ICD-10-CM | POA: Diagnosis not present

## 2018-04-15 DIAGNOSIS — I5032 Chronic diastolic (congestive) heart failure: Secondary | ICD-10-CM | POA: Diagnosis present

## 2018-04-15 DIAGNOSIS — E785 Hyperlipidemia, unspecified: Secondary | ICD-10-CM | POA: Diagnosis present

## 2018-04-15 DIAGNOSIS — A419 Sepsis, unspecified organism: Secondary | ICD-10-CM | POA: Diagnosis not present

## 2018-04-15 DIAGNOSIS — G4733 Obstructive sleep apnea (adult) (pediatric): Secondary | ICD-10-CM | POA: Diagnosis present

## 2018-04-15 DIAGNOSIS — E876 Hypokalemia: Secondary | ICD-10-CM | POA: Diagnosis present

## 2018-04-15 DIAGNOSIS — I110 Hypertensive heart disease with heart failure: Secondary | ICD-10-CM | POA: Diagnosis present

## 2018-04-15 DIAGNOSIS — Z720 Tobacco use: Secondary | ICD-10-CM | POA: Diagnosis present

## 2018-04-15 DIAGNOSIS — I4891 Unspecified atrial fibrillation: Secondary | ICD-10-CM | POA: Diagnosis not present

## 2018-04-15 DIAGNOSIS — F0390 Unspecified dementia without behavioral disturbance: Secondary | ICD-10-CM | POA: Diagnosis present

## 2018-04-15 DIAGNOSIS — Z8614 Personal history of Methicillin resistant Staphylococcus aureus infection: Secondary | ICD-10-CM | POA: Diagnosis not present

## 2018-04-15 DIAGNOSIS — I482 Chronic atrial fibrillation, unspecified: Secondary | ICD-10-CM | POA: Diagnosis present

## 2018-04-15 DIAGNOSIS — F17210 Nicotine dependence, cigarettes, uncomplicated: Secondary | ICD-10-CM | POA: Diagnosis present

## 2018-04-15 DIAGNOSIS — J449 Chronic obstructive pulmonary disease, unspecified: Secondary | ICD-10-CM | POA: Diagnosis present

## 2018-04-15 DIAGNOSIS — E860 Dehydration: Secondary | ICD-10-CM | POA: Diagnosis present

## 2018-04-15 DIAGNOSIS — N179 Acute kidney failure, unspecified: Secondary | ICD-10-CM | POA: Diagnosis present

## 2018-04-15 DIAGNOSIS — Z951 Presence of aortocoronary bypass graft: Secondary | ICD-10-CM | POA: Diagnosis not present

## 2018-04-15 DIAGNOSIS — I959 Hypotension, unspecified: Secondary | ICD-10-CM

## 2018-04-15 DIAGNOSIS — I480 Paroxysmal atrial fibrillation: Secondary | ICD-10-CM | POA: Diagnosis present

## 2018-04-15 DIAGNOSIS — R6510 Systemic inflammatory response syndrome (SIRS) of non-infectious origin without acute organ dysfunction: Secondary | ICD-10-CM | POA: Diagnosis present

## 2018-04-15 LAB — DIFFERENTIAL
Abs Immature Granulocytes: 0.1 10*3/uL (ref 0.0–0.1)
BASOS ABS: 0 10*3/uL (ref 0.0–0.1)
Basophils Relative: 0 %
Eosinophils Absolute: 0 10*3/uL (ref 0.0–0.7)
Eosinophils Relative: 0 %
Immature Granulocytes: 1 %
Lymphocytes Relative: 10 %
Lymphs Abs: 0.7 10*3/uL (ref 0.7–4.0)
MONO ABS: 0.6 10*3/uL (ref 0.1–1.0)
MONOS PCT: 7 %
NEUTROS ABS: 6.3 10*3/uL (ref 1.7–7.7)
Neutrophils Relative %: 82 %

## 2018-04-15 LAB — CBC
HCT: 37.8 % (ref 36.0–46.0)
HEMATOCRIT: 39.8 % (ref 36.0–46.0)
HEMOGLOBIN: 12.8 g/dL (ref 12.0–15.0)
Hemoglobin: 12 g/dL (ref 12.0–15.0)
MCH: 33.3 pg (ref 26.0–34.0)
MCH: 33.4 pg (ref 26.0–34.0)
MCHC: 31.7 g/dL (ref 30.0–36.0)
MCHC: 32.2 g/dL (ref 30.0–36.0)
MCV: 103.6 fL — ABNORMAL HIGH (ref 78.0–100.0)
MCV: 105.3 fL — AB (ref 78.0–100.0)
PLATELETS: 137 10*3/uL — AB (ref 150–400)
Platelets: 153 10*3/uL (ref 150–400)
RBC: 3.59 MIL/uL — ABNORMAL LOW (ref 3.87–5.11)
RBC: 3.84 MIL/uL — ABNORMAL LOW (ref 3.87–5.11)
RDW: 12 % (ref 11.5–15.5)
RDW: 12.3 % (ref 11.5–15.5)
WBC: 6.5 10*3/uL (ref 4.0–10.5)
WBC: 7.7 10*3/uL (ref 4.0–10.5)

## 2018-04-15 LAB — COMPREHENSIVE METABOLIC PANEL
ALT: 11 U/L (ref 0–44)
AST: 13 U/L — ABNORMAL LOW (ref 15–41)
Albumin: 3.4 g/dL — ABNORMAL LOW (ref 3.5–5.0)
Alkaline Phosphatase: 80 U/L (ref 38–126)
Anion gap: 14 (ref 5–15)
BUN: 27 mg/dL — ABNORMAL HIGH (ref 6–20)
CALCIUM: 9 mg/dL (ref 8.9–10.3)
CO2: 19 mmol/L — AB (ref 22–32)
CREATININE: 1.52 mg/dL — AB (ref 0.44–1.00)
Chloride: 103 mmol/L (ref 98–111)
GFR calc non Af Amer: 36 mL/min — ABNORMAL LOW (ref >60)
GFR, EST AFRICAN AMERICAN: 42 mL/min — AB (ref >60)
Glucose, Bld: 96 mg/dL (ref 70–99)
Potassium: 4.1 mmol/L (ref 3.5–5.1)
SODIUM: 136 mmol/L (ref 135–145)
Total Bilirubin: 0.9 mg/dL (ref 0.3–1.2)
Total Protein: 6.4 g/dL — ABNORMAL LOW (ref 6.5–8.1)

## 2018-04-15 LAB — URINALYSIS, ROUTINE W REFLEX MICROSCOPIC
BILIRUBIN URINE: NEGATIVE
GLUCOSE, UA: NEGATIVE mg/dL
Ketones, ur: 5 mg/dL — AB
NITRITE: NEGATIVE
Protein, ur: NEGATIVE mg/dL
SPECIFIC GRAVITY, URINE: 1.021 (ref 1.005–1.030)
pH: 5 (ref 5.0–8.0)

## 2018-04-15 LAB — I-STAT TROPONIN, ED: Troponin i, poc: 0.03 ng/mL (ref 0.00–0.08)

## 2018-04-15 LAB — I-STAT CHEM 8, ED
BUN: 30 mg/dL — AB (ref 6–20)
CALCIUM ION: 1.14 mmol/L — AB (ref 1.15–1.40)
CHLORIDE: 106 mmol/L (ref 98–111)
CREATININE: 1.4 mg/dL — AB (ref 0.44–1.00)
GLUCOSE: 94 mg/dL (ref 70–99)
HCT: 39 % (ref 36.0–46.0)
Hemoglobin: 13.3 g/dL (ref 12.0–15.0)
Potassium: 4.3 mmol/L (ref 3.5–5.1)
Sodium: 137 mmol/L (ref 135–145)
TCO2: 20 mmol/L — ABNORMAL LOW (ref 22–32)

## 2018-04-15 LAB — PROTIME-INR
INR: 1.18
Prothrombin Time: 14.9 seconds (ref 11.4–15.2)

## 2018-04-15 LAB — BASIC METABOLIC PANEL
Anion gap: 15 (ref 5–15)
BUN: 27 mg/dL — AB (ref 6–20)
CHLORIDE: 107 mmol/L (ref 98–111)
CO2: 14 mmol/L — ABNORMAL LOW (ref 22–32)
CREATININE: 1.33 mg/dL — AB (ref 0.44–1.00)
Calcium: 8.5 mg/dL — ABNORMAL LOW (ref 8.9–10.3)
GFR calc Af Amer: 49 mL/min — ABNORMAL LOW (ref >60)
GFR, EST NON AFRICAN AMERICAN: 42 mL/min — AB (ref >60)
Glucose, Bld: 117 mg/dL — ABNORMAL HIGH (ref 70–99)
Potassium: 4.3 mmol/L (ref 3.5–5.1)
SODIUM: 136 mmol/L (ref 135–145)

## 2018-04-15 LAB — I-STAT BETA HCG BLOOD, ED (MC, WL, AP ONLY): HCG, QUANTITATIVE: 13.9 m[IU]/mL — AB (ref <5)

## 2018-04-15 LAB — I-STAT CG4 LACTIC ACID, ED: Lactic Acid, Venous: 1.92 mmol/L — ABNORMAL HIGH (ref 0.5–1.9)

## 2018-04-15 LAB — PROCALCITONIN: Procalcitonin: <0.1 ng/mL

## 2018-04-15 LAB — CORTISOL-AM, BLOOD: Cortisol - AM: 28.3 ug/dL — ABNORMAL HIGH (ref 6.7–22.6)

## 2018-04-15 LAB — TSH: TSH: 0.514 u[IU]/mL (ref 0.350–4.500)

## 2018-04-15 LAB — APTT: aPTT: 29 seconds (ref 24–36)

## 2018-04-15 MED ORDER — HYDRALAZINE HCL 20 MG/ML IJ SOLN: 5.0000 mg | INTRAMUSCULAR | Status: DC | PRN

## 2018-04-15 MED ORDER — FLUTICASONE PROPIONATE 50 MCG/ACT NA SUSP
2.0000 | Freq: Every day | NASAL | Status: DC
  Administered 2018-04-18 – 2018-04-23 (×6): 2 via NASAL
  Filled 2018-04-15: qty 16

## 2018-04-15 MED ORDER — ASPIRIN EC 81 MG PO TBEC
81.0000 mg | DELAYED_RELEASE_TABLET | Freq: Every day | ORAL | Status: DC
  Administered 2018-04-16 – 2018-04-27 (×8): 81 mg via ORAL
  Filled 2018-04-15 (×12): qty 1

## 2018-04-15 MED ORDER — MAGNESIUM OXIDE 400 (241.3 MG) MG PO TABS
400.0000 mg | ORAL_TABLET | Freq: Every day | ORAL | Status: DC
  Administered 2018-04-16 – 2018-04-27 (×10): 400 mg via ORAL
  Filled 2018-04-15 (×11): qty 1

## 2018-04-15 MED ORDER — SODIUM CHLORIDE 0.9 % IV SOLN
1.0000 g | Freq: Three times a day (TID) | INTRAVENOUS | Status: DC
  Administered 2018-04-15 – 2018-04-16 (×4): 1 g via INTRAVENOUS
  Filled 2018-04-15 (×6): qty 1

## 2018-04-15 MED ORDER — VITAMIN D (ERGOCALCIFEROL) 1.25 MG (50000 UNIT) PO CAPS
50000.0000 [IU] | ORAL_CAPSULE | ORAL | Status: DC
  Administered 2018-04-24: 50000 [IU] via ORAL
  Filled 2018-04-15 (×2): qty 1

## 2018-04-15 MED ORDER — VANCOMYCIN HCL IN DEXTROSE 1-5 GM/200ML-% IV SOLN: 1000.0000 mg | Freq: Once | INTRAVENOUS | Status: DC

## 2018-04-15 MED ORDER — VANCOMYCIN HCL IN DEXTROSE 750-5 MG/150ML-% IV SOLN: 750.0000 mg | Freq: Two times a day (BID) | INTRAVENOUS | Status: DC

## 2018-04-15 MED ORDER — LEVOTHYROXINE SODIUM 200 MCG PO TABS
200.0000 ug | ORAL_TABLET | Freq: Every day | ORAL | Status: DC
Start: 2018-04-15 — End: 2018-04-18
  Administered 2018-04-16: 200 ug via ORAL
  Filled 2018-04-15: qty 2
  Filled 2018-04-15 (×2): qty 1
  Filled 2018-04-15: qty 2
  Filled 2018-04-15 (×2): qty 1

## 2018-04-15 MED ORDER — SODIUM CHLORIDE 0.9 % IV BOLUS
1000.0000 mL | Freq: Once | INTRAVENOUS | Status: AC
  Administered 2018-04-15: 1000 mL via INTRAVENOUS

## 2018-04-15 MED ORDER — TOPIRAMATE 25 MG PO TABS
50.0000 mg | ORAL_TABLET | Freq: Two times a day (BID) | ORAL | Status: DC
  Administered 2018-04-15 – 2018-04-26 (×19): 50 mg via ORAL
  Filled 2018-04-15 (×26): qty 2

## 2018-04-15 MED ORDER — ONDANSETRON HCL 4 MG/2ML IJ SOLN: 4.0000 mg | Freq: Four times a day (QID) | INTRAMUSCULAR | Status: DC | PRN

## 2018-04-15 MED ORDER — VITAMIN B-12 1000 MCG PO TABS
1000.0000 ug | ORAL_TABLET | Freq: Every day | ORAL | Status: DC
  Administered 2018-04-15 – 2018-04-27 (×11): 1000 ug via ORAL
  Filled 2018-04-15 (×11): qty 1

## 2018-04-15 MED ORDER — ESCITALOPRAM OXALATE 10 MG PO TABS
10.0000 mg | ORAL_TABLET | Freq: Every day | ORAL | Status: DC
  Administered 2018-04-16 – 2018-04-27 (×10): 10 mg via ORAL
  Filled 2018-04-15 (×11): qty 1

## 2018-04-15 MED ORDER — ALBUTEROL SULFATE (2.5 MG/3ML) 0.083% IN NEBU: 2.5000 mg | INHALATION_SOLUTION | RESPIRATORY_TRACT | Status: DC | PRN

## 2018-04-15 MED ORDER — ATORVASTATIN CALCIUM 40 MG PO TABS
40.0000 mg | ORAL_TABLET | Freq: Every day | ORAL | Status: DC
  Administered 2018-04-15 – 2018-04-26 (×11): 40 mg via ORAL
  Filled 2018-04-15 (×13): qty 1

## 2018-04-15 MED ORDER — IOPAMIDOL (ISOVUE-370) INJECTION 76%
INTRAVENOUS | Status: AC
  Filled 2018-04-15: qty 100

## 2018-04-15 MED ORDER — SODIUM CHLORIDE 0.9 % IV SOLN
2.0000 g | Freq: Once | INTRAVENOUS | Status: AC
  Administered 2018-04-15: 2 g via INTRAVENOUS
  Filled 2018-04-15: qty 2

## 2018-04-15 MED ORDER — BUTALBITAL-APAP-CAFFEINE 50-325-40 MG PO TABS: 1.0000 | ORAL_TABLET | Freq: Three times a day (TID) | ORAL | Status: DC | PRN

## 2018-04-15 MED ORDER — NALOXONE HCL 2 MG/2ML IJ SOSY
2.0000 mg | PREFILLED_SYRINGE | Freq: Once | INTRAMUSCULAR | Status: AC
  Administered 2018-04-14: 2 mg via INTRAVENOUS

## 2018-04-15 MED ORDER — ONDANSETRON HCL 4 MG PO TABS: 4.0000 mg | ORAL_TABLET | Freq: Four times a day (QID) | ORAL | Status: DC | PRN

## 2018-04-15 MED ORDER — ZOLPIDEM TARTRATE 5 MG PO TABS
5.0000 mg | ORAL_TABLET | Freq: Every evening | ORAL | Status: DC | PRN
  Filled 2018-04-15: qty 1

## 2018-04-15 MED ORDER — GLUCAGON HCL RDNA (DIAGNOSTIC) 1 MG IJ SOLR
2.0000 mg | Freq: Once | INTRAMUSCULAR | Status: AC
  Administered 2018-04-15: 2 mg via INTRAVENOUS
  Filled 2018-04-15: qty 2

## 2018-04-15 MED ORDER — FAMOTIDINE 20 MG PO TABS
20.0000 mg | ORAL_TABLET | Freq: Two times a day (BID) | ORAL | Status: DC
  Administered 2018-04-15 – 2018-04-27 (×21): 20 mg via ORAL
  Filled 2018-04-15 (×22): qty 1

## 2018-04-15 MED ORDER — METOPROLOL TARTRATE 5 MG/5ML IV SOLN
2.5000 mg | Freq: Once | INTRAVENOUS | Status: AC
  Administered 2018-04-15: 2.5 mg via INTRAVENOUS
  Filled 2018-04-15: qty 5

## 2018-04-15 MED ORDER — NICOTINE 21 MG/24HR TD PT24
21.0000 mg | MEDICATED_PATCH | Freq: Every day | TRANSDERMAL | Status: DC
  Administered 2018-04-16 – 2018-04-27 (×2): 21 mg via TRANSDERMAL
  Filled 2018-04-15 (×8): qty 1

## 2018-04-15 MED ORDER — ACETAMINOPHEN 325 MG PO TABS
650.0000 mg | ORAL_TABLET | Freq: Four times a day (QID) | ORAL | Status: DC | PRN
  Administered 2018-04-18 – 2018-04-26 (×8): 650 mg via ORAL
  Filled 2018-04-15 (×9): qty 2

## 2018-04-15 MED ORDER — HYDROCORTISONE NA SUCCINATE PF 100 MG IJ SOLR
100.0000 mg | Freq: Once | INTRAMUSCULAR | Status: AC
  Administered 2018-04-15: 100 mg via INTRAVENOUS
  Filled 2018-04-15: qty 2

## 2018-04-15 MED ORDER — MIRTAZAPINE 15 MG PO TABS
15.0000 mg | ORAL_TABLET | Freq: Every day | ORAL | Status: DC
  Administered 2018-04-15: 15 mg via ORAL
  Filled 2018-04-15 (×2): qty 1

## 2018-04-15 MED ORDER — IOPAMIDOL (ISOVUE-370) INJECTION 76%
80.0000 mL | Freq: Once | INTRAVENOUS | Status: AC | PRN
  Administered 2018-04-15: 80 mL via INTRAVENOUS

## 2018-04-15 MED ORDER — VANCOMYCIN HCL 10 G IV SOLR
1750.0000 mg | Freq: Once | INTRAVENOUS | Status: AC
  Administered 2018-04-15: 1750 mg via INTRAVENOUS
  Filled 2018-04-15: qty 1750

## 2018-04-15 MED ORDER — BUPROPION HCL ER (XL) 150 MG PO TB24
150.0000 mg | ORAL_TABLET | Freq: Every morning | ORAL | Status: DC
  Administered 2018-04-16 – 2018-04-27 (×10): 150 mg via ORAL
  Filled 2018-04-15 (×11): qty 1

## 2018-04-15 NOTE — Progress Notes (Addendum)
Pharmacy Antibiotic Note  [name] is a 62 y.o. female admitted on 04/14/2018 with sepsis.  Pharmacy has been consulted for vancomycin/aztreonam dosing. SCr down to 1.4, CrCl~40-45. Patient with multiple allergies listed to abx, no hx of cephalosporins noted in chart.  Plan: Aztreonam 2g IV x 1; then 1g IV q8h Vancomycin 1750mg  IV x 1; then 750mg  IV q12h Monitor clinical progress, c/s, renal function F/u de-escalation plan/LOT, vancomycin trough as indicated      Temp (24hrs), Avg:97.9 F (36.6 C), Min:97 F (36.1 C), Max:98.8 F (37.1 C)  Recent Labs  Lab 04/14/18 2355 04/15/18 0003  WBC 7.7  --   CREATININE 1.52* 1.40*    Estimated Creatinine Clearance: 41.8 mL/min (A) (by C-G formula based on SCr of 1.4 mg/dL (H)).    Allergies  Allergen Reactions  . Avelox [Moxifloxacin Hcl In Nacl] Other (See Comments)    Mental breakdown.  [name] [Nortriptyline Hcl] Other (See Comments)    Made her want to hurt herself  . Amoxicillin Hives  . Hydrocodone Itching  . Oxycodone Itching  . Penicillins Hives       . Sulfa Antibiotics Other (See Comments)    unknown    Babs Bertin, PharmD, BCPS Clinical Pharmacist 04/15/2018 1:47 AM

## 2018-04-15 NOTE — ED Notes (Signed)
[facility] called to cancel code stroke per Dr Aroor 

## 2018-04-15 NOTE — Consult Note (Signed)
Requesting Physician: Dr. Nicanor Alcon    Chief Complaint: AMS after fall  History obtained from: Patient and Chart    HPI:                                                                                                                                       [name] is an 62 y.o. female with complicated PMH of recent ICH in the setting of Anticoagulation with Xarelto,Atrial fibrillation, prior right MCA stroke, COPD, CHF, HTN, HLD, CAD presents to the ER as a stroke alert.  The patient was at nursing facility and was found on the floor by nursing staff. She was confused. BP recorded and patient was noted to be hypotensive. EMS arrives and noted patient  Was cyanotic and unable to get accurate oxygen saturation. They also noticed patient had left facial droop and left arm weakness and brought the patient as stroke alert, unaware about her prior stroke history.    On arrival to ED, patient alert and following commands. Stat CT head was obtained which showed resolution of recent IVH. On reviewing prior neurology progress note, left facial droop and left side weakness present on exam at time of discharge following ICH. Stroke alert was cancelled.   Workup in ED revealed patient was in sepsis and patient admitted for further management.      Date last known well: 8.6.18 Time last known well: 10.20 pm tPA Given: no, recent IVH, low suspicion of stroke NIHSS: 3 Baseline MRS 3     Past Medical History:  Diagnosis Date  . Acute right MCA stroke (HCC) 11/07/10  . Atrial fibrillation (HCC)    a. s/p TEE-DCCV 02/2104; b. Xarelto started  . CAD (coronary artery disease)    a.  cath 09/2010: LAD stent patent, S-Int/dCFX ok, S-PDA ok, L-LAD atretic;  b. Lexiscan Myoview (02/2014):  no ischemia, EF 55%; c. 11/2014 Cath/PCI: LM nl, LAD 20pISR, LCX 80-23m, OM1 nl, RI 70p, RCA 40-46m, RPDA 95ost/95-54m (PTCA only w/ reduction to 50p/84m), 60d, L->LAD atretic, VG->RI->OM nl, VG->PDA 100p.  .  Cardiomyopathy with EF 40% at TEE 02/17/14 (likely tachycardia mediated - Myoview 02/19/14 neg for ischemia with normal EF) 02/18/2014  . COPD (chronic obstructive pulmonary disease) (HCC)   . Depression   . Eczema   . GERD (gastroesophageal reflux disease)   . Headache   . HLD (hyperlipidemia)   . Homelessness 11/12/2011  . 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 Hx of CABG   . Hx of transesophageal echocardiography (TEE) for monitoring 11/2010   TEE 11/2010: EF 60-65%, BAE, trivial atrial septal shunt;  right heart cath in 10/2010 with elevated R and L heart pressures and diuretic started  . Hypertension   . Hypothyroidism   . Persistent atrial  fibrillation (HCC) 10/2014  . Pulmonary nodules    repeat CT due in 11/2011  . Sleep apnea    recent sleep study  in 04/2014 per chart review  shows no significant OSA    Past Surgical History:  Procedure Laterality Date  . bilateral knee surgery    . BLADDER SURGERY    . CARDIOVERSION N/A 02/17/2014   Procedure: CARDIOVERSION;  Surgeon: Pricilla Riffle, MD;  Location: [facility] ENDOSCOPY;  Service: Cardiovascular;  Laterality: N/A;  . CARDIOVERSION N/A 09/20/2016   Procedure: CARDIOVERSION;  Surgeon: Lars Masson, MD;  Location: [facility] ENDOSCOPY;  Service: Cardiovascular;  Laterality: N/A;  . cath 2012    . CHEST WALL RECONSTRUCTION    . CORONARY ARTERY BYPASS GRAFT    . debriment for infection in chest    . HERNIA REPAIR    . LEFT AND RIGHT HEART CATHETERIZATION WITH CORONARY ANGIOGRAM N/A 11/10/2014   Procedure: LEFT AND RIGHT HEART CATHETERIZATION WITH CORONARY ANGIOGRAM;  Surgeon: Marykay Lex, MD;  Location: [facility] CATH LAB;  Service: Cardiovascular;  Laterality: N/A;  . Left mastoidectomy    . TEE WITHOUT CARDIOVERSION N/A 02/17/2014   Procedure: TRANSESOPHAGEAL ECHOCARDIOGRAM (TEE);  Surgeon: Pricilla Riffle, MD;  Location: [facility] ENDOSCOPY;  Service: Cardiovascular;   Laterality: N/A;    Family History  Problem Relation Age of Onset  . COPD Mother   . Heart disease Mother   . Arthritis Mother        Rheumatoid and PMR  . Osteoporosis Mother        Mom fractured hip  . Diabetes type II Mother   . Depression Mother   . Heart attack Father   . Depression Father   . Hypertension Father   . Alcohol abuse Father   . Depression Sister   . Anxiety disorder Sister   . Drug abuse Sister   . Stroke Neg Hx    Social History:  reports that she has been smoking cigarettes.  She has a 15.00 pack-year smoking history. She has never used smokeless tobacco. She reports that she does not drink alcohol or use drugs.  Allergies:  Allergies  Allergen Reactions  . Avelox [Moxifloxacin Hcl In Nacl] Other (See Comments)    Mental breakdown.  [name] [Nortriptyline Hcl] Other (See Comments)    Made her want to hurt herself  . Amoxicillin Hives  . Hydrocodone Itching  . Oxycodone Itching  . Penicillins Hives       . Sulfa Antibiotics Other (See Comments)    unknown    Medications:                                                                                                                        I reviewed medications at [facility]. Currently on ASA   ROS:  14 systems reviewed and negative except above    Examination:                                                                                                      General: Appears well-developed and well-nourished.  Psych: Affect appropriate to situation Eyes: No scleral injection HENT: No OP obstrucion Head: Normocephalic.  Cardiovascular: Normal rate and regular rhythm.  Respiratory: Effort normal and breath sounds normal to anterior ascultation GI: Soft.  No distension. There is no tenderness.  Skin: WDI    Neurological Examination Mental Status: Alert, oriented,  thought content appropriate.  Stated month incorrectly,. Speech fluent without evidence of aphasia. Able to simple commands.  Cranial Nerves: II: Visual fields grossly normal,  III,IV, VI: ptosis not present, extra-ocular motions intact bilaterally, pupils equal, round, reactive to light and accommodation VII; Left facial droop VIII: hearing normal bilaterally IX,X: uvula rises symmetrically XI: bilateral shoulder shrug XII: midline tongue extension Motor: Right : Upper extremity   5/5    Left:     Upper extremity   4/5  Lower extremity   5/5     Lower extremity   5/5 Tone and bulk:normal tone throughout; no atrophy noted Sensory: Pinprick and light touch intact throughout, bilaterally Plantars: Right: downgoing   Left: downgoing Cerebellar: No obvious ataxia, finger to nose appeared normal Gait: did not assess    Lab Results: Basic Metabolic Panel: Recent Labs  Lab 04/14/18 2355 04/15/18 0003  NA 136 137  K 4.1 4.3  CL 103 106  CO2 19*  --   GLUCOSE 96 94  BUN 27* 30*  CREATININE 1.52* 1.40*  CALCIUM 9.0  --     CBC: Recent Labs  Lab 04/14/18 2355 04/15/18 0003  WBC 7.7  --   NEUTROABS 6.3  --   HGB 12.8 13.3  HCT 39.8 39.0  MCV 103.6*  --   PLT 153  --     Coagulation Studies: Recent Labs    04/14/18 2355  LABPROT 14.9  INR 1.18    Imaging: Dg Chest 2 View  Result Date: 04/15/2018 CLINICAL DATA:  Unwitnessed fall with facial droop and slurred speech. EXAM: CHEST - 2 VIEW COMPARISON:  03/25/2018 FINDINGS: Patient is status post CABG. Nonaneurysmal atherosclerotic aorta is noted. There is stable cardiomegaly with diffuse interstitial prominence suspicious for mild interstitial edema. No effusion or pneumothorax. Degenerative changes are present along dorsal spine. Surgical clips project over the right upper thorax. IMPRESSION: Cardiomegaly with aortic atherosclerosis and post CABG change. Mild diffuse interstitial prominence may reflect a component of mild  interstitial edema. Electronically Signed   By: Tollie Eth M.D.   On: 04/15/2018 01:23   Ct Head Code Stroke Wo Contrast  Result Date: 04/15/2018 CLINICAL DATA:  Code stroke. Left-sided facial droop and right arm weakness. EXAM: CT HEAD WITHOUT CONTRAST TECHNIQUE: Contiguous axial images were obtained from the base of the skull through the vertex without intravenous contrast. COMPARISON:  03/25/2018 head CT FINDINGS: Brain: There is no acute hemorrhage or extra-axial collection. There is debris within the left  lateral ventricle compatible with resolving blood products. There is generalized atrophy without lobar predilection. Old infarct in the right MCA territory with associated encephalomalacia. There is hypoattenuation of the periventricular white matter, most commonly indicating chronic ischemic microangiopathy. Vascular: No abnormal hyperdensity of the major intracranial arteries or dural venous sinuses. No intracranial atherosclerosis. Skull: The visualized skull base, calvarium and extracranial soft tissues are normal. Sinuses/Orbits: Moderate ethmoid and maxillary sinus mucosal thickening. The orbits are normal. ASPECTS [facility] Stroke Program Early CT Score) - Ganglionic level infarction (caudate, lentiform nuclei, internal capsule, insula, M1-M3 cortex): 7 - Supraganglionic infarction (M4-M6 cortex): 3 Total score (0-10 with 10 being normal): 10 IMPRESSION: 1. No acute hemorrhage. 2. Resolving blood products in the left lateral ventricle and encephalomalacia at the site of prior right MCA infarct. 3. ASPECTS is 10. These results were communicated to Dr. Georgiana Spinner Aroor at 12:07 am on 04/15/2018 by text page via the [facility] messaging system. Electronically Signed   By: Deatra Robinson M.D.   On: 04/15/2018 00:09    Reviewed CT head    ASSESSMENT AND PLAN  4 -year-old female recently discharged to nursing home after suffering intraventricular hemorrhage setting of being anticoagulated on Xarelto presents  to the ED as a stroke alert.  She was mainly stroke alerted for left facial droop left arm weakness-which is likely chronic from her residual right MCA stroke.  Documentation from her last neurology note confirms this.  Her CT head showed resolution of hemorrhage.  I think primary reason for presentation was hypotension in the setting of sepsis.  Canceled stroke alert.  Impression Sepsis, likely from UTI    Please call for questions.  Neurology will sign off.   Sushanth Aroor [facility] Pager Number [phone]

## 2018-04-15 NOTE — ED Notes (Signed)
Patient transported to CT 

## 2018-04-15 NOTE — Plan of Care (Signed)
  Problem: Clinical Measurements: Goal: Respiratory complications will improve Outcome: Progressing Note:  No s/s of respiratory complications noted. Goal: Cardiovascular complication will be avoided Outcome: Progressing Note:  No s/s of cardiovascular complications noted.   Problem: Coping: Goal: Level of anxiety will decrease Outcome: Progressing Note:  No s/s of anxiety noted.   

## 2018-04-15 NOTE — H&P (Addendum)
History and Physical    [name]:[id number] DOB: May 24, 1956 DOA: 04/14/2018  Referring MD/NP/PA:   PCP: Madelin Headings, MD   Patient coming from:  The patient is coming from home.  At baseline, pt is independent for most of ADL.   Chief Complaint: AMS, fall  HPI: [name] is a 62 y.o. female with medical history significant of recent hemorrhagic stroke with left-sided weakness, hypertension, hyperlipidemia, COPD, atrial fibrillation not on anticoagulants, CAD, CABG, CHF, OSA, tobacco abuse, GERD, hypothyroidism, depression, who presents with a fall and altered mental status.  Patient has AMS, and is unable to provide accurate medical history, therefore, most of the history is obtained by discussing the case with ED physician, per EMS report, and with the nursing staff.  Per report, pt is from SNF. She fell at about 22:15 PM. She was noted to be confused and very lethargic and less interactive, with left facial droop and slurred speech.  Patient has mild cough, but no acute respiratory distress.  No active nausea, vomiting, diarrhea notated.  She has mild left-sided weakness, which is likely from previous stroke.  Patient was found to have hypotension with blood pressure 73/51, which improved to 92/40 after 1 liter normal saline bolus.   ED Course: pt was found to have positive urinalysis with moderate amount of leukocyte, WBC 6-10, rare bacteria, hazy appearance (with WBC clumps), WBC 7.7, INR 1.18, negative troponin, AKI with creatinine 1.40, BUN 30, no fever, no tachycardia, has tachypnea, oxygen saturation 91% on room air.  Chest x-ray showed cardiomegaly and mild interstitial edema. CTA of chest is negative for PE.   CT head showed: 1. Resolution of the prior blood products in the right lateral ventricle and third ventricle. Mild reduction in volume of the hyperdense blood products filling most of the left lateral ventricle. Reduction in left-to-right midline shift from  previous 8 mm to current 6 mm. 2. Right anterior MCA distribution encephalomalacia compatible with remote stroke. 3. Periventricular white matter and corona radiata hypodensities favor chronic ischemic microvascular white matter disease. 4. Small right mastoid effusion.  Prior left mastoidectomy.  Review of Systems: Could not reviewed accurately due to altered mental status.  Allergy:  Allergies  Allergen Reactions  . Avelox [Moxifloxacin Hcl In Nacl] Other (See Comments)    Mental breakdown.  [name] [Nortriptyline Hcl] Other (See Comments)    Made her want to hurt herself  . Amoxicillin Hives  . Hydrocodone Itching  . Oxycodone Itching  . Penicillins Hives       . Sulfa Antibiotics Other (See Comments)    unknown    Past Medical History:  Diagnosis Date  . Acute right MCA stroke (HCC) 11/07/10  . Atrial fibrillation (HCC)    a. s/p TEE-DCCV 02/2104; b. Xarelto started  . CAD (coronary artery disease)    a.  cath 09/2010: LAD stent patent, S-Int/dCFX ok, S-PDA ok, L-LAD atretic;  b. Lexiscan Myoview (02/2014):  no ischemia, EF 55%; c. 11/2014 Cath/PCI: LM nl, LAD 20pISR, LCX 80-87m, OM1 nl, RI 70p, RCA 40-33m, RPDA 95ost/95-78m (PTCA only w/ reduction to 50p/1m), 60d, L->LAD atretic, VG->RI->OM nl, VG->PDA 100p.  . Cardiomyopathy with EF 40% at TEE 02/17/14 (likely tachycardia mediated - Myoview 02/19/14 neg for ischemia with normal EF) 02/18/2014  . COPD (chronic obstructive pulmonary disease) (HCC)   . Depression   . Eczema   . GERD (gastroesophageal reflux disease)   . Headache   . HLD (hyperlipidemia)   . Homelessness  11/12/2011  . 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 Hx of CABG   . Hx of transesophageal echocardiography (TEE) for monitoring 11/2010   TEE 11/2010: EF 60-65%, BAE, trivial atrial septal shunt;  right heart cath in 10/2010 with elevated R and L heart pressures and diuretic  started  . Hypertension   . Hypothyroidism   . Persistent atrial fibrillation (HCC) 10/2014  . Pulmonary nodules    repeat CT due in 11/2011  . Sleep apnea    recent sleep study  in 04/2014 per chart review  shows no significant OSA    Past Surgical History:  Procedure Laterality Date  . bilateral knee surgery    . BLADDER SURGERY    . CARDIOVERSION N/A 02/17/2014   Procedure: CARDIOVERSION;  Surgeon: Pricilla Riffle, MD;  Location: [facility] ENDOSCOPY;  Service: Cardiovascular;  Laterality: N/A;  . CARDIOVERSION N/A 09/20/2016   Procedure: CARDIOVERSION;  Surgeon: Lars Masson, MD;  Location: [facility] ENDOSCOPY;  Service: Cardiovascular;  Laterality: N/A;  . cath 2012    . CHEST WALL RECONSTRUCTION    . CORONARY ARTERY BYPASS GRAFT    . debriment for infection in chest    . HERNIA REPAIR    . LEFT AND RIGHT HEART CATHETERIZATION WITH CORONARY ANGIOGRAM N/A 11/10/2014   Procedure: LEFT AND RIGHT HEART CATHETERIZATION WITH CORONARY ANGIOGRAM;  Surgeon: Marykay Lex, MD;  Location: [facility] CATH LAB;  Service: Cardiovascular;  Laterality: N/A;  . Left mastoidectomy    . TEE WITHOUT CARDIOVERSION N/A 02/17/2014   Procedure: TRANSESOPHAGEAL ECHOCARDIOGRAM (TEE);  Surgeon: Pricilla Riffle, MD;  Location: [facility] ENDOSCOPY;  Service: Cardiovascular;  Laterality: N/A;    Social History:  reports that she has been smoking cigarettes.  She has a 15.00 pack-year smoking history. She has never used smokeless tobacco. She reports that she does not drink alcohol or use drugs.  Family History:  Family History  Problem Relation Age of Onset  . COPD Mother   . Heart disease Mother   . Arthritis Mother        Rheumatoid and PMR  . Osteoporosis Mother        Mom fractured hip  . Diabetes type II Mother   . Depression Mother   . Heart attack Father   . Depression Father   . Hypertension Father   . Alcohol abuse Father   . Depression Sister   . Anxiety disorder Sister   . Drug abuse Sister   . Stroke Neg Hx       Prior to Admission medications   Medication Sig Start Date End Date Taking? Authorizing Provider  acetaminophen (TYLENOL) 325 MG tablet Take 650 mg by mouth every 6 (six) hours as needed for mild pain.   Yes [provider]  aspirin EC 81 MG EC tablet Take 1 tablet (81 mg total) by mouth daily. 03/25/18  Yes Layne Benton, NP  atorvastatin (LIPITOR) 40 MG tablet Take 1 tablet (40 mg total) by mouth daily at 6 PM. 03/24/18  Yes Layne Benton, NP  buPROPion (WELLBUTRIN XL) 150 MG 24 hr tablet Take 1 tablet (150 mg total) by mouth every morning. 04/01/18  Yes Osvaldo Shipper, MD  butalbital-acetaminophen-caffeine (FIORICET, ESGIC) [phone] MG tablet Take 1 tablet by mouth every 8 (eight) hours as needed for headache. 03/24/18  Yes Layne Benton, NP  cloNIDine (CATAPRES) 0.1 MG tablet Take 1 tablet (0.1 mg total) by mouth 2 (two) times daily. 04/01/18  Yes Osvaldo Shipper, MD  diltiazem (CARDIZEM SR) 120 MG 12 hr capsule Take 1 capsule (120 mg total) by mouth 2 (two) times daily. 04/01/18  Yes Osvaldo Shipper, MD  escitalopram (LEXAPRO) 10 MG tablet Take 1 tablet (10 mg total) by mouth daily. 10/29/17 10/29/18 Yes Plovsky, Earvin Hansen, MD  fluticasone (FLONASE) 50 MCG/ACT nasal spray Place 2 sprays into both nostrils daily. 08/15/16  Yes Panosh, Neta Mends, MD  levothyroxine (SYNTHROID, LEVOTHROID) 200 MCG tablet Take 1 tablet (200 mcg total) by mouth daily before breakfast. 03/25/18  Yes Layne Benton, NP  lisinopril (PRINIVIL,ZESTRIL) 10 MG tablet Take 1 tablet (10 mg total) by mouth daily. 04/02/18  Yes Osvaldo Shipper, MD  magnesium oxide (MAG-OX) 400 MG tablet Take 1 tablet (400 mg total) by mouth daily. 09/17/16  Yes Lars Masson, MD  metoprolol tartrate (LOPRESSOR) 25 MG tablet Take 1 tablet (25 mg total) by mouth 2 (two) times daily. 04/01/18  Yes Osvaldo Shipper, MD  mirtazapine (REMERON) 15 MG tablet Take 15 mg by mouth at bedtime.   Yes [provider]  PROAIR HFA 108 (90 Base)  MCG/ACT inhaler INHALE 2 PUFFS BY MOUTH EVERY 6 HOURS AS NEEDED FOR SHORTNESS OF BREATH 02/03/18  Yes Byrum, Les Pou, MD  ranitidine (ZANTAC) 150 MG tablet Take 1 tablet (150 mg total) by mouth 2 (two) times daily. 04/16/17  Yes Panosh, Neta Mends, MD  topiramate (TOPAMAX) 50 MG tablet Take 1 tablet (50 mg total) by mouth 2 (two) times daily. 10/13/17  Yes Micki Riley, MD  vitamin B-12 1000 MCG tablet Take 1 tablet (1,000 mcg total) by mouth daily. 03/25/18  Yes Layne Benton, NP  Vitamin D, Ergocalciferol, (DRISDOL) 50000 units CAPS capsule Take 50,000 Units by mouth every 7 (seven) days. On Friday   Yes [provider]    Physical Exam: Vitals:   04/15/18 0430 04/15/18 0440 04/15/18 0550 04/15/18 0602  BP: 102/70 (!) 141/87  101/69  Pulse:  (!) 53 (!) 47 66  Resp: 19 (!) 23 20 20   Temp: 97.9 F (36.6 C) 97.9 F (36.6 C)  97.8 F (36.6 C)  TempSrc:    Oral  SpO2:  100%  100%  Weight:   68.6 kg (151 lb 3.8 oz)   Height:   5\' 4"  (1.626 m)    General: Not in acute distress HEENT:       Eyes: PERRL, EOMI, no scleral icterus.       ENT: No discharge from the ears and nose, no pharynx injection, no tonsillar enlargement.        Neck: No JVD, no bruit, no mass felt. Heme: No neck lymph node enlargement. Cardiac: S1/S2, RRR, No murmurs, No gallops or rubs. Respiratory: No rales, wheezing, rhonchi or rubs. GI: Soft, nondistended, nontender, no organomegaly, BS present. GU: No hematuria Ext: has trace leg edema bilaterally. 2+DP/PT pulse bilaterally. Musculoskeletal: No joint deformities, No joint redness or warmth, no limitation of ROM in spin. Skin: No rashes.  Neuro: confused, not oriented X3, cranial nerves II-XII grossly intact, has mild left sided weakness. Psych: Patient is not psychotic, no suicidal or hemocidal ideation.  Labs on Admission: I have personally reviewed following labs and imaging studies  CBC: Recent Labs  Lab 04/14/18 2355 04/15/18 0003 04/15/18 0540   WBC 7.7  --  6.5  NEUTROABS 6.3  --   --  HGB 12.8 13.3 12.0  HCT 39.8 39.0 37.8  MCV 103.6*  --  105.3*  PLT 153  --  137*   Basic Metabolic Panel: Recent Labs  Lab 04/14/18 2355 04/15/18 0003 04/15/18 0540  NA 136 137 136  K 4.1 4.3 4.3  CL 103 106 107  CO2 19*  --  14*  GLUCOSE 96 94 117*  BUN 27* 30* 27*  CREATININE 1.52* 1.40* 1.33*  CALCIUM 9.0  --  8.5*   GFR: Estimated Creatinine Clearance: 42.8 mL/min (A) (by C-G formula based on SCr of 1.33 mg/dL (H)). Liver Function Tests: Recent Labs  Lab 04/14/18 2355  AST 13*  ALT 11  ALKPHOS 80  BILITOT 0.9  PROT 6.4*  ALBUMIN 3.4*   No results for input(s): LIPASE, AMYLASE in the last 168 hours. No results for input(s): AMMONIA in the last 168 hours. Coagulation Profile: Recent Labs  Lab 04/14/18 2355  INR 1.18   Cardiac Enzymes: No results for input(s): CKTOTAL, CKMB, CKMBINDEX, TROPONINI in the last 168 hours. BNP (last 3 results) No results for input(s): PROBNP in the last 8760 hours. HbA1C: No results for input(s): HGBA1C in the last 72 hours. CBG: Recent Labs  Lab 04/14/18 2349  GLUCAP 81   Lipid Profile: No results for input(s): CHOL, HDL, LDLCALC, TRIG, CHOLHDL, LDLDIRECT in the last 72 hours. Thyroid Function Tests: Recent Labs    04/14/18 2355  TSH 0.514   Anemia Panel: No results for input(s): VITAMINB12, FOLATE, FERRITIN, TIBC, IRON, RETICCTPCT in the last 72 hours. Urine analysis:    Component Value Date/Time   COLORURINE AMBER (A) 04/15/2018 0046   APPEARANCEUR HAZY (A) 04/15/2018 0046   LABSPEC 1.021 04/15/2018 0046   PHURINE 5.0 04/15/2018 0046   GLUCOSEU NEGATIVE 04/15/2018 0046   HGBUR SMALL (A) 04/15/2018 0046   BILIRUBINUR NEGATIVE 04/15/2018 0046   BILIRUBINUR neg 05/11/2014 2056   KETONESUR 5 (A) 04/15/2018 0046   PROTEINUR NEGATIVE 04/15/2018 0046   UROBILINOGEN 1.0 04/01/2015 1530   NITRITE NEGATIVE 04/15/2018 0046   LEUKOCYTESUR MODERATE (A) 04/15/2018 0046    Sepsis Labs: @LABRCNTIP (procalcitonin:4,lacticidven:4) )No results found for this or any previous visit (from the past 240 hour(s)).   Radiological Exams on Admission: Dg Chest 2 View  Result Date: 04/15/2018 CLINICAL DATA:  Unwitnessed fall with facial droop and slurred speech. EXAM: CHEST - 2 VIEW COMPARISON:  03/25/2018 FINDINGS: Patient is status post CABG. Nonaneurysmal atherosclerotic aorta is noted. There is stable cardiomegaly with diffuse interstitial prominence suspicious for mild interstitial edema. No effusion or pneumothorax. Degenerative changes are present along dorsal spine. Surgical clips project over the right upper thorax. IMPRESSION: Cardiomegaly with aortic atherosclerosis and post CABG change. Mild diffuse interstitial prominence may reflect a component of mild interstitial edema. Electronically Signed   By: Tollie Eth M.D.   On: 04/15/2018 01:23   Ct Angio Chest Pe W And/or Wo Contrast  Result Date: 04/15/2018 CLINICAL DATA:  Shortness of breath. EXAM: CT ANGIOGRAPHY CHEST WITH CONTRAST TECHNIQUE: Multidetector CT imaging of the chest was performed using the standard protocol during bolus administration of intravenous contrast. Multiplanar CT image reconstructions and MIPs were obtained to evaluate the vascular anatomy. CONTRAST:  80mL ISOVUE-370 IOPAMIDOL (ISOVUE-370) INJECTION 76% COMPARISON:  Chest CT 11/16/2010 Chest radiograph 04/15/2018 FINDINGS: Cardiovascular: --Pulmonary arteries: Contrast injection is sufficient to demonstrate satisfactory opacification of the pulmonary arteries to the segmental level. There is no pulmonary embolus. The main pulmonary artery is within normal limits for size. --Aorta: Limited opacification of  the aorta due to bolus timing optimization for the pulmonary arteries. Conventional 3 vessel aortic branching pattern. The aortic course and caliber are normal. There is no aortic atherosclerosis. --Heart: Mild cardiomegaly. No pericardial effusion.  Mediastinum/Nodes: No mediastinal, hilar or axillary lymphadenopathy. The visualized thyroid and thoracic esophageal course are unremarkable. Lungs/Pleura: No pulmonary nodules or masses. No pleural effusion or pneumothorax. No focal airspace consolidation. No focal pleural abnormality. Upper Abdomen: Contrast bolus timing is not optimized for evaluation of the abdominal organs. Within this limitation, the visualized organs of the upper abdomen are normal. Musculoskeletal: No chest wall abnormality. No acute or significant osseous findings. Review of the MIP images confirms the above findings. IMPRESSION: 1. No pulmonary embolus. 2. Mild cardiomegaly. 3. No acute thoracic abnormality. Electronically Signed   By: Deatra Robinson M.D.   On: 04/15/2018 05:01   Ct Head Code Stroke Wo Contrast  Result Date: 04/15/2018 CLINICAL DATA:  Code stroke. Left-sided facial droop and right arm weakness. EXAM: CT HEAD WITHOUT CONTRAST TECHNIQUE: Contiguous axial images were obtained from the base of the skull through the vertex without intravenous contrast. COMPARISON:  03/25/2018 head CT FINDINGS: Brain: There is no acute hemorrhage or extra-axial collection. There is debris within the left lateral ventricle compatible with resolving blood products. There is generalized atrophy without lobar predilection. Old infarct in the right MCA territory with associated encephalomalacia. There is hypoattenuation of the periventricular white matter, most commonly indicating chronic ischemic microangiopathy. Vascular: No abnormal hyperdensity of the major intracranial arteries or dural venous sinuses. No intracranial atherosclerosis. Skull: The visualized skull base, calvarium and extracranial soft tissues are normal. Sinuses/Orbits: Moderate ethmoid and maxillary sinus mucosal thickening. The orbits are normal. ASPECTS [facility] Stroke Program Early CT Score) - Ganglionic level infarction (caudate, lentiform nuclei, internal capsule, insula,  M1-M3 cortex): 7 - Supraganglionic infarction (M4-M6 cortex): 3 Total score (0-10 with 10 being normal): 10 IMPRESSION: 1. No acute hemorrhage. 2. Resolving blood products in the left lateral ventricle and encephalomalacia at the site of prior right MCA infarct. 3. ASPECTS is 10. These results were communicated to Dr. Georgiana Spinner Aroor at 12:07 am on 04/15/2018 by text page via the [facility] messaging system. Electronically Signed   By: Deatra Robinson M.D.   On: 04/15/2018 00:09     EKG: Independently reviewed.  Atrial fibrillation, QTc 414, early R wave progression, T wave inversion.  Assessment/Plan Principal Problem:   UTI (urinary tract infection) Active Problems:   Hypothyroidism   Chronic diastolic heart failure (HCC)   CAD '07, LAD PCI 2012, SVG-PDA PTCA 11/10/14   Hyperlipidemia   AKI (acute kidney injury) (HCC)   Hypotension   COPD (chronic obstructive pulmonary disease) (HCC)   HTN (hypertension)   Smoker   Stroke (cerebrum) (HCC)   IVH (intraventricular hemorrhage) (HCC)   GERD (gastroesophageal reflux disease)   Atrial fibrillation, chronic (HCC)   Depression   Fall   Tobacco abuse   Sepsis (HCC)   Acute metabolic encephalopathy  UTI and possible sepsis: UA showed moderate amount of leukocyte, WBC 6-10, rare bacteria, hazy appearance, with WBC clumps, indicating possible UTI.  Patient does not have fever or leukocytosis, but had hypotension and tachypnea, indicating possible sepsis.  Blood pressure responded to IV fluid resuscitation.  Blood pressure improved from 73/5172/40 after giving 1 liter and normal saline bolus.  Currently hemodynamically stable.  -will admit to SDu as inpt -IV aztreonam (patient received 1 dose of vancomycin in ED) -Follow-up blood culture and urine culture. -will get Procalcitonin  and trend lactic acid levels per sepsis protocol. -IVF: 1L of NS bolus in ED. Will not continue IVF due to interstitial pulmonary edema on chest x-ray.  Hypotension: Etiology  is not completely clear. Possibly due to sepsis secondary to UTI.  Blood pressure responded to IV fluid resuscitation.  Currently hemodynamically stable. -Check cortisol level, then give 1 dose of Solu-Cortef 100 mg -IV fluid as needed -hold blood pressure medications. -EDP ordered one dose of glucagon to reverse beta-blocker metoprolol effect  Acute metabolic encephalopathy: Most likely due to UTI.  CT head showed improvement of hemorrhage and midline shift. -Frequent neuro check -Neurology was consulted. Dr. Laurence Slate will see pt  Hypothyroidism: Last TSH was 102 on 03/21/18 when pt had acute stroke -Continue home Synthroid -Check TSH  Chronic diastolic heart failure (HCC): 2D echo on 03/26/2018 showed EF 50%.  Patient has a trace leg edema, but no acute respiratory distress.  Chest x-ray showed mild interstitial edema.  Patient does not have acute CHF exacerbation, but at risk of developing CHF exacerbation. -Check a BMP  CAD '07, LAD PCI 2012, SVG-PDA PTCA 11/10/14: s/p of CABG. Not sure if pt has any CP. -contin aspirin, Lipitorue   Hyperlipidemia: -lipitor  AKI (acute kidney injury) (HCC): Likely due to combination of dehydration and possible ATN secondary to hypotension. - IV fluid as above  COPD (chronic obstructive pulmonary disease) (HCC): -prn albuterol nebs  HTN (hypertension): -hold all Bp meds now -IV hydralazine as needed  Stroke and IVH (intraventricular hemorrhage) (HCC): CT-head showed improvement of hemorrhage and midline shift. -Continue aspirin, Lipitor  GERD: -Pepcid IV  Atrial Fibrillation: CHA2DS2-VASc Score is 6, needs oral anticoagulation. Patient was on [facility] before which was d/c'ed due to recent hemorrhagic stroke.   heart rate is well controlled. -hold Metoprolol due to hypotension  Depression: -continue home meds  Fall: CT-head showed no acute injury. -PT/OT when able to (not ordered yet)  Tobacco abuse: -nicotine patch     DVT ppx: SCD Code  Status: Full code Family Communication: None at bed side.  Disposition Plan:  Anticipate discharge back to previous home environment Consults called:  Dr. Laurence Slate Admission status: SDU/inpation       Date of Service 04/15/2018    [name] [facility] Pager [phone]  If 7PM-7AM, please contact night-coverage [email or web address] Password [facility] 04/15/2018, 6:54 AM

## 2018-04-15 NOTE — Progress Notes (Signed)
Patient only oriented to self. CSW called patient's son and left voicemail requesting call back. Patient from [facility] for rehab. Patient will require new PT/OT evaluations to qualify for return to SNF, and would need [employer] authorization. Full assessment to follow when CSW able to reach son.  Abigail Butts, LCSWA [phone]

## 2018-04-15 NOTE — Plan of Care (Signed)
I reviewed Dr. Laurence Slate note and agree with his opinion. Pt admitted for hypotension and septic shock. Recommend to continue medical management of such.   He had recent admission 03/2018 for IVH and discharged with ASA. Plan for follow up CT head and decide on anticoagulation use. On this admission, CT head showed IVH resolution. However, he may not be a good candidate to start right at this time. He had appointment with neurologist Dr. Everlena Cooper on 04/21/18 and Shanda Bumps NP on 05/05/18 and I would defer to them for further evaluation and decision on the timing of resuming NOAC. I would recommend eliquis this time instead of Xarelto.   Marvel Plan, MD PhD Stroke Neurology 04/15/2018 7:24 AM

## 2018-04-15 NOTE — ED Notes (Signed)
Admitting Provider at bedside. 

## 2018-04-15 NOTE — Clinical Social Work Note (Signed)
Clinical Social Work Assessment  Patient Details  Name: [name] MRN: [id number] Date of Birth: Feb 10, 1956  Date of referral:  04/15/18               Reason for consult:  Facility Placement, Discharge Planning(patient from [facility] for rehab)                Permission sought to share information with:  Facility Medical sales representative, Family Supports Permission granted to share information::  No(patient only oriented to self)  Name::     [name]  Agency::  [facility]  Relationship::  son  Contact Information:  [phone]  Housing/Transportation Living arrangements for the past 2 months:  Skilled Nursing Facility, Single Family Home Source of Information:  Adult Children Patient Interpreter Needed:  None Criminal Activity/Legal Involvement Pertinent to Current Situation/Hospitalization:  No - Comment as needed Significant Relationships:  Adult Children Lives with:  Self Do you feel safe going back to the place where you live?  Yes Need for family participation in patient care:  Yes (Comment)  Care giving concerns: Patient from [facility] for rehab.    Social Worker assessment / plan: Patient only oriented to self. CSW spoke to patient's son, [name], on the phone. CSW introduced self and role and discussed disposition planning. [name] hopeful for patient to return to SNF. He reports patient may need long term care at [facility] and he has started the application process for Medicaid. Son hopeful patient can rehab at least a little before transitioning to long term care. Paged MD for PT/OT orders. Patient will require new [employer] auth before returning to SNF. CSW to follow and support.  Employment status:  Retired Biomedical engineer) PT Recommendations:  Not assessed at this time Information / Referral to community resources:  Skilled Nursing Facility  Patient/Family's Response to care: Son appreciative of  care.  Patient/Family's Understanding of and Emotional Response to Diagnosis, Current Treatment, and Prognosis: Son with understanding of patient's condition and agreeable for patient to return to SNF.  Emotional Assessment Appearance:  Appears stated age Attitude/Demeanor/Rapport:  Unable to Assess Affect (typically observed):  Unable to Assess Orientation:  Oriented to Self Alcohol / Substance use:  Not Applicable Psych involvement (Current and /or in the community):  No (Comment)  Discharge Needs  Concerns to be addressed:  Discharge Planning Concerns, Care Coordination Readmission within the last 30 days:  Yes Current discharge risk:  Physical Impairment, Cognitively Impaired Barriers to Discharge:  Continued Medical Work up   Abigail Butts, LCSW 04/15/2018, 4:28 PM

## 2018-04-15 NOTE — Progress Notes (Signed)
Admitted earlier today with AMS, possible UTI and hypotension. Continue management as outlined.

## 2018-04-15 NOTE — ED Notes (Signed)
Patient transported to X-ray 

## 2018-04-16 DIAGNOSIS — E869 Volume depletion, unspecified: Secondary | ICD-10-CM

## 2018-04-16 LAB — TSH: TSH: 0.398 u[IU]/mL (ref 0.350–4.500)

## 2018-04-16 LAB — URINE CULTURE: Culture: <10000 — AB

## 2018-04-16 MED ORDER — CLONIDINE HCL 0.1 MG PO TABS
0.1000 mg | ORAL_TABLET | Freq: Two times a day (BID) | ORAL | Status: DC
  Administered 2018-04-16 – 2018-04-18 (×2): 0.1 mg via ORAL
  Filled 2018-04-16 (×2): qty 1

## 2018-04-16 MED ORDER — DILTIAZEM HCL-DEXTROSE 100-5 MG/100ML-% IV SOLN (PREMIX)
5.0000 mg/h | INTRAVENOUS | Status: DC
  Administered 2018-04-16: 5 mg/h via INTRAVENOUS
  Filled 2018-04-16: qty 100

## 2018-04-16 MED ORDER — SODIUM CHLORIDE 0.9 % IV SOLN
INTRAVENOUS | Status: DC
Start: 2018-04-16 — End: 2018-04-23
  Administered 2018-04-16 – 2018-04-22 (×3): via INTRAVENOUS

## 2018-04-16 MED ORDER — METOPROLOL TARTRATE 25 MG PO TABS
25.0000 mg | ORAL_TABLET | Freq: Two times a day (BID) | ORAL | Status: DC
  Administered 2018-04-16 – 2018-04-27 (×13): 25 mg via ORAL
  Filled 2018-04-16 (×17): qty 1

## 2018-04-16 MED ORDER — DILTIAZEM HCL ER 60 MG PO CP12
120.0000 mg | ORAL_CAPSULE | Freq: Two times a day (BID) | ORAL | Status: DC
  Administered 2018-04-16: 120 mg via ORAL
  Filled 2018-04-16 (×2): qty 2

## 2018-04-16 MED ORDER — DILTIAZEM LOAD VIA INFUSION
10.0000 mg | Freq: Once | INTRAVENOUS | Status: AC
  Administered 2018-04-16: 10 mg via INTRAVENOUS
  Filled 2018-04-16: qty 10

## 2018-04-16 NOTE — Progress Notes (Signed)
Pts. Left AC IV found laying on floor. Right AC IV access flushed, leaking. Informed pt. Of non working IV access and that we have to established new one, in order to give her IV med's in particular IV Cardizem for HR control. Pt. Refuses new IV access and IV Cardizem, states she does not want any cardiac medications (Lopressor, Catapress), before she discussed them with her primary Cardiologist Dr. Eloy End, because the MD made changes to her regiment. Educated about importance of IV access in case of emergency and for IV medications. Pt. continuous to refuse. Charge Nurse Winn Jock, RN notified. Charge nurse talked to pt with this RN present in room. Charge nurse educated pt. On importance of medication compliance. Pt. continuous to refuse IV and PO med's until she has talked to Dr. Delton See. K. Schorr with [facility] at 22.20 notified. Awaiting call back. Pts. HR currently between 80's and 90's, A-fib.

## 2018-04-16 NOTE — Progress Notes (Signed)
OT Cancellation Note  Patient Details Name: [name] MRN: [id number] DOB: 1955-09-18   Cancelled Treatment:    Reason Eval/Treat Not Completed: Medical issues which prohibited therapy.  Pt with sustained HR >130.  Will reattempt.  Avni Traore Ludden, OTR/L [phone]   Jeani Hawking M 04/16/2018, 11:45 AM

## 2018-04-16 NOTE — Progress Notes (Signed)
PROGRESS NOTE    [name]  ZOX:[id number] DOB: 03-12-1956 DOA: 04/14/2018 PCP: Madelin Headings, MD  Outpatient Specialists:     Brief Narrative:  [name] is a 62 year old female with past medical history significant for recent hemorrhagic stroke with left-sided weakness, hypertension, hyperlipidemia, COPD, atrial fibrillation not on anticoagulants, CAD, CABG, CHF, OSA, tobacco abuse, GERD, hypothyroidism, depression, who presents with a fall and altered mental status.  04/16/2018: Patient becomes intermittently inappropriate, in terms of the worst the patient uses to the staff.  Patient seems more amenable to the medical intervention today.  Patient is noted to be in atrial fibrillation with rapid ventricular response (heart rate in 140s to 170 range).  Urine culture has not grown significant colonies, therefore, cultures were not completed.  Will start patient on Cardizem drip.  Will discontinue IV aztreonam.  Will check TSH, as the patient is on levothyroxine 200 mics p.o. once daily.  We will also start patient on very low-dose quetiapine, 12.5 mg p.o. twice daily.   Assessment & Plan:   Principal Problem:   UTI (urinary tract infection) Active Problems:   Hypothyroidism   Chronic diastolic heart failure (HCC)   CAD '07, LAD PCI 2012, SVG-PDA PTCA 11/10/14   Hyperlipidemia   AKI (acute kidney injury) (HCC)   Hypotension   COPD (chronic obstructive pulmonary disease) (HCC)   HTN (hypertension)   Smoker   Stroke (cerebrum) (HCC)   IVH (intraventricular hemorrhage) (HCC)   GERD (gastroesophageal reflux disease)   Atrial fibrillation, chronic (HCC)   Depression   Fall   Tobacco abuse   Sepsis (HCC)   Acute metabolic encephalopathy   UTI and possible sepsis: UA showed moderate amount of leukocyte, WBC 6-10, rare bacteria, hazy appearance, with WBC clumps, indicating possible UTI.  Patient does not have fever or leukocytosis, but had hypotension and tachypnea, indicating  possible sepsis.  Blood pressure responded to IV fluid resuscitation.  Blood pressure improved from 73/5172/40 after giving 1 liter and normal saline bolus.  Currently hemodynamically stable.  -will admit to SDu as inpt -IV aztreonam (patient received 1 dose of vancomycin in ED) -Follow-up blood culture and urine culture. -will get Procalcitonin and trend lactic acid levels per sepsis protocol. -IVF: 1L of NS bolus in ED. Will not continue IVF due to interstitial pulmonary edema on chest x-ray.  04/16/2018: Urine culture did not grow significant colony.  CT of the chest was negative for pneumonia.  Will discontinue IV aztreonam.  Hypotension: Etiology is not completely clear. Possibly due to sepsis secondary to UTI.  Blood pressure responded to IV fluid resuscitation.  Currently hemodynamically stable. -Check cortisol level, then give 1 dose of Solu-Cortef 100 mg -IV fluid as needed -hold blood pressure medications. -EDP ordered one dose of glucagon to reverse beta-blocker metoprolol effect  04/16/2018: Hypotension has resolved.  The patient will actually be started on Cardizem drip for the A. fib with RVR.  Patient's metoprolol will be resumed.  Acute encephalopathy:  Etiology unclear.   Patient has history of intracranial hemorrhage with midline shift, dull, resolving.   Continue to assess and manage. Start quetiapine 12.5 mg p.o. twice daily. -Frequent neuro check -Neurology was consulted. Dr. Laurence Slate will see pt  Hypothyroidism: Last TSH was 102 on 03/21/18 when pt had acute stroke -Continue home Synthroid -Check TSH  Chronic diastolic heart failure (HCC): 2D echo on 03/26/2018 showed EF 50%.  Patient has a trace leg edema, but no acute respiratory distress.  Chest x-ray showed  mild interstitial edema.  Patient does not have acute CHF exacerbation, but at risk of developing CHF exacerbation. -Check a BMP  04/16/2018: Remains stable for now.  CAD '07, LAD PCI 2012, SVG-PDA PTCA  11/10/14: s/p of CABG. Not sure if pt has any CP. -contin aspirin, Lipitorue   Hyperlipidemia: -lipitor  AKI (acute kidney injury) (HCC): Likely due to combination of dehydration and possible ATN secondary to hypotension. - IV fluid as above  04/16/2018: Resolving.  COPD (chronic obstructive pulmonary disease) (HCC): -prn albuterol nebs  HTN (hypertension): -hold all Bp meds now -IV hydralazine as needed  Stroke and IVH (intraventricular hemorrhage) (HCC): CT-head showed improvement of hemorrhage and midline shift. -Continue aspirin, Lipitor  GERD: -Pepcid IV  Atrial Fibrillation: CHA2DS2-VASc Score is 6, needs oral anticoagulation. Patient was on [facility] before which was d/c'ed due to recent hemorrhagic stroke.   heart rate is well controlled. -hold Metoprolol due to hypotension  Start patient on Cardizem drip, with Cardizem loading.  Patient went into atrial fibrillation with RVR at the rate of 140 to 170 bpm.  Depression: -continue home meds  Fall: CT-head showed no acute injury. -PT/OT when able to (not ordered yet)  Tobacco abuse: -nicotine patch     DVT ppx: SCD Code Status: Full code Family Communication: None at bed side.  Disposition Plan:  Anticipate discharge back to previous home environment Consults called:  Dr. Laurence Slate   Consultants:   Neurology  Procedures:   None  Antimicrobials:   IV aztreonam discontinued today   Subjective: Patient is slowly improving. No fever or chills. No shortness of breath or chest pain. No cough.  Objective: Vitals:   04/16/18 0826 04/16/18 0907 04/16/18 1152 04/16/18 1614  BP:  105/75 (!) 134/96 (!) 155/89  Pulse: 99 66 (!) 39   Resp: (!) 25  (!) 25   Temp:  98.2 F (36.8 C) 97.9 F (36.6 C)   TempSrc:  Oral Oral   SpO2: 100% 97% 97% 95%  Weight:      Height:        Intake/Output Summary (Last 24 hours) at 04/16/2018 1759 Last data filed at 04/16/2018 1205 Gross per 24 hour  Intake 505.33 ml   Output 300 ml  Net 205.33 ml   Filed Weights   04/15/18 0550 04/16/18 0449  Weight: 68.6 kg 68.5 kg    Examination:  General exam: Not in any obvious distress.    Respiratory system: Clear to auscultation.  Cardiovascular system: S1 & S2, tachycardic and irregular.  Gastrointestinal system: Abdomen is nondistended, soft and nontender. No organomegaly or masses felt. Normal bowel sounds heard. Central nervous system: Alert and oriented. No focal neurological deficits. Extremities: No leg edema.  Data Reviewed: I have personally reviewed following labs and imaging studies  CBC: Recent Labs  Lab 04/14/18 2355 04/15/18 0003 04/15/18 0540  WBC 7.7  --  6.5  NEUTROABS 6.3  --   --   HGB 12.8 13.3 12.0  HCT 39.8 39.0 37.8  MCV 103.6*  --  105.3*  PLT 153  --  137*   Basic Metabolic Panel: Recent Labs  Lab 04/14/18 2355 04/15/18 0003 04/15/18 0540  NA 136 137 136  K 4.1 4.3 4.3  CL 103 106 107  CO2 19*  --  14*  GLUCOSE 96 94 117*  BUN 27* 30* 27*  CREATININE 1.52* 1.40* 1.33*  CALCIUM 9.0  --  8.5*   GFR: Estimated Creatinine Clearance: 42.7 mL/min (A) (by C-G formula based on  SCr of 1.33 mg/dL (H)). Liver Function Tests: Recent Labs  Lab 04/14/18 2355  AST 13*  ALT 11  ALKPHOS 80  BILITOT 0.9  PROT 6.4*  ALBUMIN 3.4*   No results for input(s): LIPASE, AMYLASE in the last 168 hours. No results for input(s): AMMONIA in the last 168 hours. Coagulation Profile: Recent Labs  Lab 04/14/18 2355  INR 1.18   Cardiac Enzymes: No results for input(s): CKTOTAL, CKMB, CKMBINDEX, TROPONINI in the last 168 hours. BNP (last 3 results) No results for input(s): PROBNP in the last 8760 hours. HbA1C: No results for input(s): HGBA1C in the last 72 hours. CBG: Recent Labs  Lab 04/14/18 2349  GLUCAP 81   Lipid Profile: No results for input(s): CHOL, HDL, LDLCALC, TRIG, CHOLHDL, LDLDIRECT in the last 72 hours. Thyroid Function Tests: Recent Labs     04/14/18 2355  TSH 0.514   Anemia Panel: No results for input(s): VITAMINB12, FOLATE, FERRITIN, TIBC, IRON, RETICCTPCT in the last 72 hours. Urine analysis:    Component Value Date/Time   COLORURINE AMBER (A) 04/15/2018 0046   APPEARANCEUR HAZY (A) 04/15/2018 0046   LABSPEC 1.021 04/15/2018 0046   PHURINE 5.0 04/15/2018 0046   GLUCOSEU NEGATIVE 04/15/2018 0046   HGBUR SMALL (A) 04/15/2018 0046   BILIRUBINUR NEGATIVE 04/15/2018 0046   BILIRUBINUR neg 05/11/2014 2056   KETONESUR 5 (A) 04/15/2018 0046   PROTEINUR NEGATIVE 04/15/2018 0046   UROBILINOGEN 1.0 04/01/2015 1530   NITRITE NEGATIVE 04/15/2018 0046   LEUKOCYTESUR MODERATE (A) 04/15/2018 0046   Sepsis Labs: @LABRCNTIP (procalcitonin:4,lacticidven:4)  ) Recent Results (from the past 240 hour(s))  Urine Culture     Status: Abnormal   Collection Time: 04/15/18 12:44 AM  Result Value Ref Range Status   Specimen Description URINE, RANDOM  Final   Special Requests NONE  Final   Culture (A)  Final    <10,000 COLONIES/mL INSIGNIFICANT GROWTH Performed at [facility], [address], [place], [place] [address]    Report Status 04/16/2018 FINAL  Final  Blood Culture (routine x 2)     Status: None (Preliminary result)   Collection Time: 04/15/18  2:07 AM  Result Value Ref Range Status   Specimen Description BLOOD LEFT ANTECUBITAL  Final   Special Requests   Final    BOTTLES DRAWN AEROBIC AND ANAEROBIC Blood Culture results may not be optimal due to an inadequate volume of blood received in culture bottles   Culture   Final    NO GROWTH 1 DAY Performed at [facility], [address], [place], [place] [address]    Report Status PENDING  Incomplete  Blood Culture (routine x 2)     Status: None (Preliminary result)   Collection Time: 04/15/18  2:07 AM  Result Value Ref Range Status   Specimen Description BLOOD LEFT HAND  Final   Special Requests   Final    BOTTLES DRAWN AEROBIC ONLY Blood Culture results may  not be optimal due to an inadequate volume of blood received in culture bottles   Culture   Final    NO GROWTH 1 DAY Performed at [facility], [address] [place], [place] [address]    Report Status PENDING  Incomplete         Radiology Studies: Dg Chest 2 View  Result Date: 04/15/2018 CLINICAL DATA:  Unwitnessed fall with facial droop and slurred speech. EXAM: CHEST - 2 VIEW COMPARISON:  03/25/2018 FINDINGS: Patient is status post CABG. Nonaneurysmal atherosclerotic  aorta is noted. There is stable cardiomegaly with diffuse interstitial prominence suspicious for mild interstitial edema. No effusion or pneumothorax. Degenerative changes are present along dorsal spine. Surgical clips project over the right upper thorax. IMPRESSION: Cardiomegaly with aortic atherosclerosis and post CABG change. Mild diffuse interstitial prominence may reflect a component of mild interstitial edema. Electronically Signed   By: Tollie Eth M.D.   On: 04/15/2018 01:23   Ct Angio Chest Pe W And/or Wo Contrast  Result Date: 04/15/2018 CLINICAL DATA:  Shortness of breath. EXAM: CT ANGIOGRAPHY CHEST WITH CONTRAST TECHNIQUE: Multidetector CT imaging of the chest was performed using the standard protocol during bolus administration of intravenous contrast. Multiplanar CT image reconstructions and MIPs were obtained to evaluate the vascular anatomy. CONTRAST:  57mL ISOVUE-370 IOPAMIDOL (ISOVUE-370) INJECTION 76% COMPARISON:  Chest CT 11/16/2010 Chest radiograph 04/15/2018 FINDINGS: Cardiovascular: --Pulmonary arteries: Contrast injection is sufficient to demonstrate satisfactory opacification of the pulmonary arteries to the segmental level. There is no pulmonary embolus. The main pulmonary artery is within normal limits for size. --Aorta: Limited opacification of the aorta due to bolus timing optimization for the pulmonary arteries. Conventional 3 vessel aortic branching pattern. The aortic course and caliber are  normal. There is no aortic atherosclerosis. --Heart: Mild cardiomegaly. No pericardial effusion. Mediastinum/Nodes: No mediastinal, hilar or axillary lymphadenopathy. The visualized thyroid and thoracic esophageal course are unremarkable. Lungs/Pleura: No pulmonary nodules or masses. No pleural effusion or pneumothorax. No focal airspace consolidation. No focal pleural abnormality. Upper Abdomen: Contrast bolus timing is not optimized for evaluation of the abdominal organs. Within this limitation, the visualized organs of the upper abdomen are normal. Musculoskeletal: No chest wall abnormality. No acute or significant osseous findings. Review of the MIP images confirms the above findings. IMPRESSION: 1. No pulmonary embolus. 2. Mild cardiomegaly. 3. No acute thoracic abnormality. Electronically Signed   By: Deatra Robinson M.D.   On: 04/15/2018 05:01   Ct Head Code Stroke Wo Contrast  Result Date: 04/15/2018 CLINICAL DATA:  Code stroke. Left-sided facial droop and right arm weakness. EXAM: CT HEAD WITHOUT CONTRAST TECHNIQUE: Contiguous axial images were obtained from the base of the skull through the vertex without intravenous contrast. COMPARISON:  03/25/2018 head CT FINDINGS: Brain: There is no acute hemorrhage or extra-axial collection. There is debris within the left lateral ventricle compatible with resolving blood products. There is generalized atrophy without lobar predilection. Old infarct in the right MCA territory with associated encephalomalacia. There is hypoattenuation of the periventricular white matter, most commonly indicating chronic ischemic microangiopathy. Vascular: No abnormal hyperdensity of the major intracranial arteries or dural venous sinuses. No intracranial atherosclerosis. Skull: The visualized skull base, calvarium and extracranial soft tissues are normal. Sinuses/Orbits: Moderate ethmoid and maxillary sinus mucosal thickening. The orbits are normal. ASPECTS [facility] Stroke Program  Early CT Score) - Ganglionic level infarction (caudate, lentiform nuclei, internal capsule, insula, M1-M3 cortex): 7 - Supraganglionic infarction (M4-M6 cortex): 3 Total score (0-10 with 10 being normal): 10 IMPRESSION: 1. No acute hemorrhage. 2. Resolving blood products in the left lateral ventricle and encephalomalacia at the site of prior right MCA infarct. 3. ASPECTS is 10. These results were communicated to Dr. Georgiana Spinner Aroor at 12:07 am on 04/15/2018 by text page via the [facility] messaging system. Electronically Signed   By: Deatra Robinson M.D.   On: 04/15/2018 00:09        Scheduled Meds: . aspirin EC  81 mg Oral Daily  . atorvastatin  40 mg Oral q1800  . buPROPion  150 mg Oral q morning - 10a  . cloNIDine  0.1 mg Oral BID  . diltiazem  10 mg Intravenous Once  . escitalopram  10 mg Oral Daily  . famotidine  20 mg Oral BID  . fluticasone  2 spray Each Nare Daily  . levothyroxine  200 mcg Oral QAC breakfast  . magnesium oxide  400 mg Oral Daily  . metoprolol tartrate  25 mg Oral BID  . mirtazapine  15 mg Oral QHS  . nicotine  21 mg Transdermal Daily  . topiramate  50 mg Oral BID  . cyanocobalamin  1,000 mcg Oral Daily  . [START ON 04/17/2018] Vitamin D (Ergocalciferol)  50,000 Units Oral Q7 days   Continuous Infusions: . sodium chloride 50 mL/hr at 04/16/18 1705  . aztreonam 1 g (04/16/18 1039)  . diltiazem (CARDIZEM) infusion       LOS: 1 day    Time spent: 35 minutes.    Berton Mount, MD  [facility] Pager #: [phone] 7PM-7AM contact night coverage as above

## 2018-04-16 NOTE — Progress Notes (Signed)
Pt called to nursing station requesting her clothes & trying to leave. Pt refused to get a bladder scan but RN was able to convince pt to get one. Pt's bladder scan showed 143 ml. Pt has not voided since her foley was d/c'd this morning at 0645. Pt is still DTV. Pt's son called to talk w pt per pt's request. MD notified. Will continue to monitor the pt. Sanda Linger, RN

## 2018-04-16 NOTE — Progress Notes (Signed)
MD on call notified of pt's hr sustaining in 120s but as high as 160 at times. Pt asymptomatic. Pt is still a-fib on the monitor. Pt received 2.5 mg of IV lopressor overnight for hr. Pt also takes lopressor at home. Will continue to monitor the pt.Sanda Linger, RN

## 2018-04-16 NOTE — Care Management Note (Signed)
Case Management Note  Patient Details  Name: ELLE WISLER MRN: [id number] Date of Birth: July 19, 1956  Subjective/Objective: Pt readmitted  with  AMS / UTI. From SNF, [facility]. Recently admitted for poorly controlled HTN, 7/17-7/24. Hx of recent hemorrhagic stroke with left-sided weakness 7/10, hypertension, hyperlipidemia, COPD, atrial fibrillation not on anticoagulants, CAD, CABG, CHF, OSA, tobacco abuse, GERD, hypothyroidism, depression.   Action/Plan: PT/OT evaluations pending.... CSW following for disposition back to SNF. NCM will continue to monitor for transitional care needs.  Expected Discharge Date:                  Expected Discharge Plan:  Skilled Nursing Facility  In-House Referral:  Clinical Social Work  Discharge planning Services  CM Consult  Post Acute Care Choice:    Choice offered to:     DME Arranged:    DME Agency:     HH Arranged:    HH Agency:     Status of Service:  In process, will continue to follow  If discussed at Long Length of Stay Meetings, dates discussed:    Additional Comments:  Epifanio Lesches, RN 04/16/2018, 4:08 PM

## 2018-04-16 NOTE — Evaluation (Signed)
Physical Therapy Evaluation Patient Details Name: [name] MRN: [id number] DOB: 08-27-1956 Today's Date: 04/16/2018   History of Present Illness  62 y.o. female admitted with AMS from SNF. Believed to have UTI with possible sepsis. PMH includes but limited to: CVA right MCA, Hypotension, COPD, CAD,  CABG, B Knee surgery. Pt sustained fall at SNF    Clinical Impression  Pt admitted with above diagnosis. Pt currently with functional limitations due to the deficits listed below (see PT Problem List). Encountered patient being encouraged back to bed with nursing, pt attempting to ambulate into the wall to get to the bathroom. Currently with metabolic encephalopathy and AO to person only. HR with standing in 180's MD notified and drip ordered, as well as sitter for patient safety. Pt with paranoid thoughts believing staff out to get her. Per nursing patient has been ambulating out of room unsafely, per medical chart pt ambulating 100' with PT 7/26 with RW and poor mechanics. Unable to progress OOB safely due to high HR, will cont to assess next session. Able to stand with me with unsteadiness and required hands on assistance. Believe patient will need SNF.  Pt will benefit from skilled PT to increase their independence and safety with mobility to allow discharge to the venue listed below.    HR: 120's supine, 180's standing. MD aware.      Follow Up Recommendations SNF;Supervision/Assistance - 24 hour    Equipment Recommendations  (TBD next venue)    Recommendations for Other Services       Precautions / Restrictions Precautions Precautions: Fall Restrictions Weight Bearing Restrictions: No      Mobility  Bed Mobility Overal bed mobility: Needs Assistance Bed Mobility: Supine to Sit           General bed mobility comments: superivsion for safety, pt with AMS  Transfers Overall transfer level: Needs assistance Equipment used: None Transfers: Sit to/from Stand Sit to Stand:  Min assist            Ambulation/Gait             General Gait Details: defered due to high HR 180's with standing   Stairs            Wheelchair Mobility    Modified Rankin (Stroke Patients Only)       Balance Overall balance assessment: (N/T this session)                                           Pertinent Vitals/Pain Pain Assessment: No/denies pain    Home Living Family/patient expects to be discharged to:: Skilled nursing facility                      Prior Function Level of Independence: Needs assistance         Comments: unable to provide history,      Hand Dominance        Extremity/Trunk Assessment   Upper Extremity Assessment Upper Extremity Assessment: Defer to OT evaluation LUE Deficits / Details: Per chart, baseline weakness since CVA in 2012    Lower Extremity Assessment RLE Deficits / Details: Grossly 4/5 LLE Deficits / Details: Grossly 3/5       Communication      Cognition Arousal/Alertness: Awake/alert Behavior During Therapy: Flat affect Overall Cognitive Status: History of cognitive impairments - at baseline Area of  Impairment: Attention;Memory;Following commands;Safety/judgement;Awareness;Problem solving                 Orientation Level: Disoriented to;Time;Situation Current Attention Level: Sustained Memory: Decreased short-term memory Following Commands: Follows one step commands with increased time Safety/Judgement: Decreased awareness of safety;Decreased awareness of deficits Awareness: Emergent Problem Solving: Slow processing;Decreased initiation;Difficulty sequencing;Requires verbal cues;Requires tactile cues General Comments: Pt AO to person only at this time. states she is at [facility] Comments      Exercises     Assessment/Plan    PT Assessment Patient needs continued PT services  PT Problem List Decreased strength;Decreased  mobility;Decreased cognition;Decreased balance;Decreased knowledge of precautions;Decreased safety awareness;Decreased activity tolerance;Cardiopulmonary status limiting activity;Decreased knowledge of use of DME       PT Treatment Interventions DME instruction;Therapeutic activities;Gait training;Therapeutic exercise;Patient/family education;Balance training;Functional mobility training    PT Goals (Current goals can be found in the Care Plan section)  Acute Rehab PT Goals Patient Stated Goal: non stated PT Goal Formulation: With patient Time For Goal Achievement: 04/23/18 Potential to Achieve Goals: Good    Frequency Min 2X/week   Barriers to discharge        Co-evaluation               AM-PAC PT "6 Clicks" Daily Activity  Outcome Measure Difficulty turning over in bed (including adjusting bedclothes, sheets and blankets)?: A Little Difficulty moving from lying on back to sitting on the side of the bed? : A Little Difficulty sitting down on and standing up from a chair with arms (e.g., wheelchair, bedside commode, etc,.)?: A Little Help needed moving to and from a bed to chair (including a wheelchair)?: A Little Help needed walking in hospital room?: A Little Help needed climbing 3-5 steps with a railing? : A Little 6 Click Score: 18    End of Session   Activity Tolerance: Patient tolerated treatment well Patient left: in bed;with bed alarm set;with nursing/sitter in room Nurse Communication: Mobility status PT Visit Diagnosis: Other abnormalities of gait and mobility (R26.89);Other symptoms and signs involving the nervous system (R29.898);Muscle weakness (generalized) (M62.81)    Time: 1750-1810 PT Time Calculation (min) (ACUTE ONLY): 20 min   Charges:   PT Evaluation $PT Eval Moderate Complexity: 1 Mod         Etta Grandchild, PT, DPT Acute Rehab Services Pager: [phone]    Etta Grandchild 04/16/2018, 6:14 PM

## 2018-04-17 NOTE — NC FL2 (Signed)
[place] MEDICAID FL2 LEVEL OF CARE SCREENING TOOL     IDENTIFICATION  Patient Name: [name] Birthdate: September 28, 1955 Sex: female Admission Date (Current Location): 04/14/2018  [facility] and [place] Number:  Producer, television/film/video and Address:  [place], [address], [place], [place] [address]      Provider Number: [id number]  Attending Physician Name and Address:  Alison Murray, MD  Relative Name and Phone Number:  [name], son, [phone]    Current Level of Care: Hospital Recommended Level of Care: Skilled Nursing Facility Prior Approval Number:    Date Approved/Denied:   PASRR Number: [id number]  Discharge Plan: SNF    Current Diagnoses: Patient Active Problem List   Diagnosis Date Noted  . GERD (gastroesophageal reflux disease) 04/15/2018  . UTI (urinary tract infection) 04/15/2018  . Atrial fibrillation, chronic (HCC) 04/15/2018  . Depression 04/15/2018  . Fall 04/15/2018  . Tobacco abuse 04/15/2018  . Sepsis (HCC) 04/15/2018  . Acute metabolic encephalopathy 04/15/2018  . Hypertensive emergency 03/25/2018  . NSTEMI (non-ST elevated myocardial infarction) (HCC)   . Troponin level elevated   . IVH (intraventricular hemorrhage) (HCC) 03/19/2018  . 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 Long term current use of amiodarone 03/27/2017  . Severe recurrent major depression without psychotic features (HCC) 11/19/2016    Class: Chronic  . Pre-procedure lab exam 09/13/2016  . Generalized headaches 07/04/2016  . Protein-calorie malnutrition, severe (HCC) 04/03/2015  . Chronic diastolic CHF (congestive heart failure), NYHA class 2 (HCC) 01/24/2015  . Demand ischemia secondary to AF with RVR 12/13/2014  . CKD (chronic kidney disease), stage III (HCC) 11/11/2014  . Hypokalemia 05/22/2014  . Mood disorder (HCC) 03/20/2014  . HTN (hypertension) 03/20/2014  . Anemia, unspecified 03/20/2014  . Smoker 03/20/2014  . Atrial  fibrillation with RVR (HCC) 03/03/2014  . AKI (acute kidney injury) (HCC) 03/03/2014  . Hypotension 03/03/2014  . Pulmonary hypertension (HCC) 03/03/2014  . COPD (chronic obstructive pulmonary disease) (HCC) 03/03/2014  . H/O: CVA (cerebrovascular accident)   . Long-term (current) use of anticoagulants   . Cardiomyopathy-h/o tachycardia mediated-EF 65% per echo March 2016 02/18/2014  . Hyperlipidemia 02/18/2014  . CAD '07, LAD PCI 2012, SVG-PDA PTCA 11/10/14 02/16/2014  . Chronic diastolic heart failure (HCC)   . Pulmonary nodules   . Hypothyroidism   . OSA- C-pap intol     Orientation RESPIRATION BLADDER Height & Weight     Self, Time, Situation, Place  Normal Continent, External catheter Weight: 154 lb 12.2 oz (70.2 kg) Height:  5\' 4"  (162.6 cm)  BEHAVIORAL SYMPTOMS/MOOD NEUROLOGICAL BOWEL NUTRITION STATUS      Continent Diet(please see DC summary)  AMBULATORY STATUS COMMUNICATION OF NEEDS Skin   Extensive Assist Verbally Normal                       Personal Care Assistance Level of Assistance  Bathing, Feeding, Dressing Bathing Assistance: Maximum assistance Feeding assistance: Limited assistance Dressing Assistance: Maximum assistance     Functional Limitations Info  Sight, Hearing, Speech Sight Info: Adequate Hearing Info: Adequate Speech Info: Adequate    SPECIAL CARE FACTORS FREQUENCY  PT (By licensed PT), OT (By licensed OT)     PT Frequency: 5x/week OT Frequency: 5x/week            Contractures Contractures Info: Not present    Additional Factors Info  Code Status, Allergies, Psychotropic Code Status Info: Full Allergies Info: Avelox Moxifloxacin Hcl  In Nacl, Pamelor Nortriptyline Hcl, Amoxicillin, Hydrocodone, Oxycodone, Penicillins, Sulfa Antibiotics Psychotropic Info: wellbutrin, clonidine, lexapro         Current Medications (04/17/2018):  This is the current hospital active medication list Current Facility-Administered Medications   Medication Dose Route Frequency Provider Last Rate Last Dose  . 0.9 %  sodium chloride infusion   Intravenous Continuous Berton Mount I, MD 50 mL/hr at 04/16/18 1705    . acetaminophen (TYLENOL) tablet 650 mg  650 mg Oral Q6H PRN [name], MD      . albuterol (PROVENTIL) (2.5 MG/3ML) 0.083% nebulizer solution 2.5 mg  2.5 mg Nebulization Q4H PRN [name], MD      . aspirin EC tablet 81 mg  81 mg Oral Daily [name], MD   81 mg at 04/16/18 1610  . atorvastatin (LIPITOR) tablet 40 mg  40 mg Oral q1800 [name], MD   40 mg at 04/16/18 1700  . buPROPion (WELLBUTRIN XL) 24 hr tablet 150 mg  150 mg Oral q morning - 10a [name], MD   150 mg at 04/16/18 0826  . cloNIDine (CATAPRES) tablet 0.1 mg  0.1 mg Oral BID Berton Mount I, MD   0.1 mg at 04/16/18 1155  . diltiazem (CARDIZEM) 100 mg in dextrose 5% (1 mg/mL) infusion  5-15 mg/hr Intravenous Continuous Barnetta Chapel, MD   Stopped at 04/16/18 2220  . escitalopram (LEXAPRO) tablet 10 mg  10 mg Oral Daily [name], MD   10 mg at 04/16/18 9604  . famotidine (PEPCID) tablet 20 mg  20 mg Oral BID [name], MD   20 mg at 04/16/18 2203  . fluticasone (FLONASE) 50 MCG/ACT nasal spray 2 spray  2 spray Each Nare Daily [name], MD      . hydrALAZINE (APRESOLINE) injection 5 mg  5 mg Intravenous Q2H PRN [name], MD      . levothyroxine (SYNTHROID, LEVOTHROID) tablet 200 mcg  200 mcg Oral QAC breakfast [name], MD   200 mcg at 04/16/18 0825  . magnesium oxide (MAG-OX) tablet 400 mg  400 mg Oral Daily [name], MD   400 mg at 04/16/18 0826  . metoprolol tartrate (LOPRESSOR) tablet 25 mg  25 mg Oral BID Berton Mount I, MD   25 mg at 04/16/18 1155  . nicotine (NICODERM CQ - dosed in mg/24 hours) patch 21 mg  21 mg Transdermal Daily [name], MD   21 mg at 04/16/18 0826  . ondansetron (ZOFRAN) tablet 4 mg  4 mg Oral Q6H PRN [name], MD       Or  . ondansetron [facility]) injection 4 mg  4 mg Intravenous Q6H PRN [name], MD      . topiramate (TOPAMAX) tablet 50 mg  50 mg Oral BID [name], MD   50 mg at 04/16/18 1040  . vitamin B-12 (CYANOCOBALAMIN) tablet 1,000 mcg  1,000 mcg Oral Daily [name], MD   1,000 mcg at 04/16/18 5409  . Vitamin D (Ergocalciferol) (DRISDOL) capsule 50,000 Units  50,000 Units Oral Q7 days [name], MD      . zolpidem (AMBIEN) tablet 5 mg  5 mg Oral QHS PRN [name], MD         Discharge Medications: Please see discharge summary for a list of discharge medications.  Relevant Imaging Results:  Relevant Lab Results:   Additional Information SSN: [id number]  Abigail Butts, LCSW

## 2018-04-17 NOTE — Progress Notes (Signed)
PROGRESS NOTE    [name]  UXN:[id number] DOB: [birth year] DOA: 04/14/2018  PCP: Burnis Medin, MD   Brief Narrative:  62 year old female with past medical history significant for recent hemorrhagic stroke with left-sided weakness, hypertension, hyperlipidemia, COPD, atrial fibrillation not on anticoagulants, CAD, CABG, CHF, OSA, tobacco abuse, GERD, hypothyroidism, depression, who presented with a fall and altered mental status.  Hospital course complicated with A fib with RVR and HR In 140-170's range. She declined to have Cardizem drip until she speaks to cardiology.   Assessment & Plan:   Sepsis secondary to UTI - Sepsis criteria met on admission with hypotension, tachypnea and evidence of UTI based on urinalysis (moderate leukocytes and presence of WBC and bacteria) - Pt was on on aztreonem through 04/16/18 - Urine cx and blood cx showed no growth  - Lactic acid 1.92, repeat lactic acid in am - BP 144/93, hypotension resolved with IV fluids and resolution of sepsis   Acute metabolic encephalopathy - Due to sepsis, UTI - Better mental status this am - CT head with improving hhg CVA since prior study  Hypothyroidism - Continue synthroid 200 mcg daily   Chronic diastolic heart failure ([place]) - 2D echo on 03/26/2018 showed EF 50% - Chest x-ray showed mild interstitial edema - CHF compensated   CAD '07, LAD PCI 2012, SVG-PDA PTCA 11/10/14 - S/P of CABG - Continue Lipitor and Aspirin   Hyperlipidemia: - Continue Atorvastatin 40 mg daily   AKI (acute kidney injury) ([place]) - Prerenal, due to sepsis - Improving with hydration  - Follow up BMP in am  COPD (chronic obstructive pulmonary disease) (HCC) - Use as needed albuterol nebulizer - Stable respiratory status   Essential hypertension  - Continue clonidine 0.1 mg twice daily  - Refused metoprolol   StrokeandIVH (intraventricular hemorrhage) (HCC) - CT-head showed improvement of hemorrhage and midline  shift - Continue aspirin, Lipitor  GERD without esophagitis - Continue pepcid   Paroxysmal atrial Fibrillation - CHA2DS2-VASc Score 6 - Not on AC due to recent hhg stroke - Refused Cardizem drip - She is on metoprolol 25 mg BID but also refused that until she speaks to cardiologist  - On aspirin daily - Cardio consulted   Depression - Continue Lexapro 10 gm daily  - Continue Wellbutrin   Fall - CT head without acute findings  - Per PT - SNF recommended   Tobacco abuse - Counseled on smoking cessation - Nicotine patch ordered   Diet: heart healthy   DVT prophylaxis: SCD's, aspirin  Code Status: full code  Family Communication: no family at bedside this am Disposition Plan: will consult cardio for a fib, pt refuses Cardizem drip; Per PT eval - SNF recommended on discharge    Consultants:   Cardiology  Neurology, Dr. Lorraine Lax  Procedures:   None   Antimicrobials:   Aztreonam through 04/16/18   Subjective: No overnight events.  Objective: Vitals:   04/16/18 1614 04/16/18 2100 04/17/18 0000 04/17/18 0508  BP: (!) 155/89 111/80 125/67 (!) 144/93  Pulse:  76 83 77  Resp:  (!) 27 (!) 22 18  Temp:  98.1 F (36.7 C) 98.2 F (36.8 C) (!) 97.3 F (36.3 C)  TempSrc:  Oral Oral Oral  SpO2: 95% 96% 97% (!) 89%  Weight:    70.2 kg  Height:        Intake/Output Summary (Last 24 hours) at 04/17/2018 0821 Last data filed at 04/16/2018 2200 Gross per 24 hour  Intake 550 ml  Output 250 ml  Net 300 ml   Filed Weights   04/15/18 0550 04/16/18 0449 04/17/18 0508  Weight: 68.6 kg 68.5 kg 70.2 kg    Examination:  General exam: Appears calm and comfortable  Respiratory system: Clear to auscultation. Respiratory effort normal. Cardiovascular system: S1 & S2 heard, Irregular rate, rhythm Gastrointestinal system: Abdomen is nondistended, soft and nontender. No organomegaly or masses felt. Normal bowel sounds heard. Central nervous system: Alert and oriented. No  focal neurological deficits. Extremities: Symmetric 5 x 5 power. Skin: No rashes, lesions or ulcers Psychiatry: Judgement and insight appear normal. Mood & affect appropriate.   Data Reviewed: I have personally reviewed following labs and imaging studies  CBC: Recent Labs  Lab 04/14/18 2355 04/15/18 0003 04/15/18 0540  WBC 7.7  --  6.5  NEUTROABS 6.3  --   --   HGB 12.8 13.3 12.0  HCT 39.8 39.0 37.8  MCV 103.6*  --  105.3*  PLT 153  --  579*   Basic Metabolic Panel: Recent Labs  Lab 04/14/18 2355 04/15/18 0003 04/15/18 0540  NA 136 137 136  K 4.1 4.3 4.3  CL 103 106 107  CO2 19*  --  14*  GLUCOSE 96 94 117*  BUN 27* 30* 27*  CREATININE 1.52* 1.40* 1.33*  CALCIUM 9.0  --  8.5*   GFR: Estimated Creatinine Clearance: 43.2 mL/min (A) (by C-G formula based on SCr of 1.33 mg/dL (H)). Liver Function Tests: Recent Labs  Lab 04/14/18 2355  AST 13*  ALT 11  ALKPHOS 80  BILITOT 0.9  PROT 6.4*  ALBUMIN 3.4*   No results for input(s): LIPASE, AMYLASE in the last 168 hours. No results for input(s): AMMONIA in the last 168 hours. Coagulation Profile: Recent Labs  Lab 04/14/18 2355  INR 1.18   Cardiac Enzymes: No results for input(s): CKTOTAL, CKMB, CKMBINDEX, TROPONINI in the last 168 hours. BNP (last 3 results) No results for input(s): PROBNP in the last 8760 hours. HbA1C: No results for input(s): HGBA1C in the last 72 hours. CBG: Recent Labs  Lab 04/14/18 2349  GLUCAP 81   Lipid Profile: No results for input(s): CHOL, HDL, LDLCALC, TRIG, CHOLHDL, LDLDIRECT in the last 72 hours. Thyroid Function Tests: Recent Labs    04/16/18 1819  TSH 0.398   Anemia Panel: No results for input(s): VITAMINB12, FOLATE, FERRITIN, TIBC, IRON, RETICCTPCT in the last 72 hours. Urine analysis:    Component Value Date/Time   COLORURINE AMBER (A) 04/15/2018 0046   APPEARANCEUR HAZY (A) 04/15/2018 0046   LABSPEC 1.021 04/15/2018 0046   PHURINE 5.0 04/15/2018 0046    GLUCOSEU NEGATIVE 04/15/2018 0046   HGBUR SMALL (A) 04/15/2018 0046   BILIRUBINUR NEGATIVE 04/15/2018 0046   BILIRUBINUR neg 05/11/2014 2056   KETONESUR 5 (A) 04/15/2018 0046   PROTEINUR NEGATIVE 04/15/2018 0046   UROBILINOGEN 1.0 04/01/2015 1530   NITRITE NEGATIVE 04/15/2018 0046   LEUKOCYTESUR MODERATE (A) 04/15/2018 0046   Sepsis Labs: '@LABRCNTIP' (procalcitonin:4,lacticidven:4)   Recent Results (from the past 240 hour(s))  Urine Culture     Status: Abnormal   Collection Time: 04/15/18 12:44 AM  Result Value Ref Range Status   Specimen Description URINE, RANDOM  Final   Special Requests NONE  Final   Culture (A)  Final    <10,000 COLONIES/mL INSIGNIFICANT GROWTH Performed at [facility], [address] [place], [place] [address]    Report Status 04/16/2018 FINAL  Final  Blood Culture (routine x 2)  Status: None (Preliminary result)   Collection Time: 04/15/18  2:07 AM  Result Value Ref Range Status   Specimen Description BLOOD LEFT ANTECUBITAL  Final   Special Requests   Final    BOTTLES DRAWN AEROBIC AND ANAEROBIC Blood Culture results may not be optimal due to an inadequate volume of blood received in culture bottles   Culture   Final    NO GROWTH 1 DAY Performed at [facility] [address] [place], [place] [address]    Report Status PENDING  Incomplete  Blood Culture (routine x 2)     Status: None (Preliminary result)   Collection Time: 04/15/18  2:07 AM  Result Value Ref Range Status   Specimen Description BLOOD LEFT HAND  Final   Special Requests   Final    BOTTLES DRAWN AEROBIC ONLY Blood Culture results may not be optimal due to an inadequate volume of blood received in culture bottles   Culture   Final    NO GROWTH 1 DAY Performed at [facility], [place] [address] [place], [place] [address]    Report Status PENDING  Incomplete      Radiology Studies: Dg Chest 2 View Result Date: 04/15/2018 Cardiomegaly with aortic atherosclerosis  and post CABG change. Mild diffuse interstitial prominence may reflect a component of mild interstitial edema.   Ct Angio Chest Pe W And/or Wo Contrast Result Date: 04/15/2018 1. No pulmonary embolus. 2. Mild cardiomegaly. 3. No acute thoracic abnormality.   Ct Head Code Stroke Wo Contrast Result Date: 04/15/2018 1. No acute hemorrhage. 2. Resolving blood products in the left lateral ventricle and encephalomalacia at the site of prior right MCA infarct. 3.    Scheduled Meds: . aspirin EC  81 mg Oral Daily  . atorvastatin  40 mg Oral q1800  . buPROPion  150 mg Oral q morning - 10a  . cloNIDine  0.1 mg Oral BID  . escitalopram  10 mg Oral Daily  . famotidine  20 mg Oral BID  . fluticasone  2 spray Each Nare Daily  . levothyroxine  200 mcg Oral QAC breakfast  . magnesium oxide  400 mg Oral Daily  . metoprolol tartrate  25 mg Oral BID  . nicotine  21 mg Transdermal Daily  . topiramate  50 mg Oral BID  . cyanocobalamin  1,000 mcg Oral Daily  . Vitamin D (Ergocalciferol)  50,000 Units Oral Q7 days   Continuous Infusions: . sodium chloride 50 mL/hr at 04/16/18 1705  . diltiazem (CARDIZEM) infusion Stopped (04/16/18 2220)     LOS: 2 days    Time spent: 25 minutes Greater than 50% of the time spent on counseling and coordinating the care.   Leisa Lenz, MD [facility] Pager [phone]  If 7PM-7AM, please contact night-coverage [email or web address] Password [email or web address] 04/17/2018, 8:21 AM

## 2018-04-17 NOTE — Progress Notes (Signed)
Pt has refused to take any of her medications tonight. Pt would not give any explanation of why she is refusing. Will attempt again later. ]

## 2018-04-17 NOTE — Progress Notes (Signed)
CSW started [facility] authorization request for SNF for patient. Determination pending. Patient requires auth before admitting to facility. CSW to follow for medical readiness and support with discharge planning.  Abigail Butts, LCSWA [phone]

## 2018-04-17 NOTE — Progress Notes (Signed)
Pt. sitting at side of bed. HR increased to 120's. Assisted to bathroom and HR went as high as 180's. MD at bedside and talked to pt. About HR issue. MD will consult Cardiology. HR at present time 100's to 120's with pt. laying in bed.Still continuous to refuse IV access.

## 2018-04-17 NOTE — Evaluation (Signed)
Occupational Therapy Evaluation Patient Details Name: [name] MRN: [id number] DOB: May 24, 1956 Today's Date: 04/17/2018    History of Present Illness 62 y.o. female admitted with AMS from SNF. Believed to have UTI with possible sepsis. PMH includes but limited to: CVA right MCA, Hypotension, COPD, CAD,  CABG, B Knee surgery. Pt sustained fall at SNF   Clinical Impression   Pt currently confused, not oriented to place, time, or situation.  Not receptive to re-direction of these things stating "I was not at a nursing home! I was in a house at [facility]."  HR at 130s to 160s sitting EOB during selfcare tasks.  It increased up to 180 with standing to pull pants over hips.  Did not attempt further activity secondary to increased HR.  Will need 24 hour supervision/assist and further rehab at SNF level.  Will defer further treatment to them as expected discharge later today per notes.    Follow Up Recommendations  SNF;Supervision/Assistance - 24 hour    Equipment Recommendations  Other (comment)(TBD next venue of care)       Precautions / Restrictions Precautions Precautions: Fall Precaution Comments: tachycardia Restrictions Weight Bearing Restrictions: No      Mobility Bed Mobility Overal bed mobility: Needs Assistance Bed Mobility: Sit to Supine     Supine to sit: Supervision        Transfers Overall transfer level: Needs assistance Equipment used: None Transfers: Sit to/from Stand Sit to Stand: Min assist              Balance Overall balance assessment: Needs assistance Sitting-balance support: No upper extremity supported;Feet supported Sitting balance-Leahy Scale: Fair     Standing balance support: No upper extremity supported Standing balance-Leahy Scale: Poor Standing balance comment: Pt reliant on UE support for balance in standing when attempting to pull up pants, and take steps up toward the top of the bed.                             ADL either performed or assessed with clinical judgement   ADL Overall ADL's : Needs assistance/impaired Eating/Feeding: Set up   Grooming: Wash/dry hands;Wash/dry face;Set up   Upper Body Bathing: Set up;Sitting   Lower Body Bathing: Minimal assistance;Sit to/from stand       Lower Body Dressing: Minimal assistance;Sit to/from stand   Toilet Transfer: Minimal Cabin crew Details (indicate cue type and reason): simulated, pt delclined need to toilet Toileting- Clothing Manipulation and Hygiene: Minimal assistance         General ADL Comments: Pt disoriented to place, time, and situation.  She would not accept answers from therapist about her reason for hospitalization and that she was at a SNF prior to this admission.  HR in sitting on entry into the room around 140 BPM.  During sitting it would fluctuate down to 131 and up to 177.  Limited session secondary to this and did not attempt functional mobility.  Pt placed pt back in bed at end of session with HR in the 120s.  Will need SNf for follow-up rehab and 24 hour supervision.      Vision Baseline Vision/History: Wears glasses Wears Glasses: Reading only Patient Visual Report: No change from baseline Vision Assessment?: Yes Eye Alignment: Within Functional Limits Ocular Range of Motion: Within Functional Limits Tracking/Visual Pursuits: Able to track stimulus in all quads without difficulty Visual Fields: No apparent deficits  Pertinent Vitals/Pain Pain Assessment: No/denies pain        Extremity/Trunk Assessment Upper Extremity Assessment Upper Extremity Assessment: Overall WFL for tasks assessed;Generalized weakness LUE Deficits / Details: AROM WFLs bilaterally with strength overall 4/5 throughout   Lower Extremity Assessment Lower Extremity Assessment: Defer to PT evaluation          Cognition Arousal/Alertness: Awake/alert Behavior During Therapy: Flat  affect Overall Cognitive Status: Impaired/Different from baseline Area of Impairment: Orientation;Awareness;Attention;Memory;Safety/judgement                 Orientation Level: Disoriented to;Place;Time;Situation Current Attention Level: Sustained Memory: Decreased short-term memory Following Commands: Follows one step commands consistently Safety/Judgement: Decreased awareness of safety;Decreased awareness of deficits Awareness: Intellectual Problem Solving: Requires verbal cues General Comments: Pt disoriented and would not believe any information that therapist was telling her with regards to place or reason for hospitalization.              Home Living Family/patient expects to be discharged to:: Skilled nursing facility                                                 OT Problem List: Decreased strength;Decreased cognition;Decreased activity tolerance               Barriers to D/C: Decreased caregiver support             AM-PAC PT "6 Clicks" Daily Activity     Outcome Measure Help from another person eating meals?: None Help from another person taking care of personal grooming?: A Little Help from another person toileting, which includes using toliet, bedpan, or urinal?: A Little Help from another person bathing (including washing, rinsing, drying)?: A Little Help from another person to put on and taking off regular upper body clothing?: A Little Help from another person to put on and taking off regular lower body clothing?: A Little 6 Click Score: 19   End of Session Nurse Communication: Other (comment)(elevated HR)  Activity Tolerance: Other (comment)(treatment limited secondary to increased HR) Patient left: in bed  OT Visit Diagnosis: Unsteadiness on feet (R26.81);Muscle weakness (generalized) (M62.81)                Time: 5621-3086 OT Time Calculation (min): 22 min Charges:  OT General Charges $OT Visit: 1 Visit OT  Evaluation $OT Eval Moderate Complexity: 1 Mod   Delmont Prosch OTR/L 04/17/2018, 10:41 AM

## 2018-04-17 NOTE — Consult Note (Signed)
   [facility] CM Inpatient Consult   04/17/2018  [name] Sep 12, 1955 [id number]   Patient screened for a high risk score for unplanned readmission and with less than 30 days with [employer] plan. Patient from [facility]. Chart review reveals current plan is to return to a SNF.  Spoke with inpatient social worker, Darl Pikes that [facility] had just started to follow at [facility].  Came by to speak with patient and she was in a therapy session. [facility] will plan to follow for disposition and needs.  For questions contact:   Charlesetta Shanks, RN BSN CCM [facility]  [phone] business mobile phone Toll free office [phone]

## 2018-04-18 DIAGNOSIS — I4891 Unspecified atrial fibrillation: Secondary | ICD-10-CM

## 2018-04-18 LAB — BASIC METABOLIC PANEL
ANION GAP: 6 (ref 5–15)
BUN: 12 mg/dL (ref 6–20)
CHLORIDE: 108 mmol/L (ref 98–111)
CO2: 25 mmol/L (ref 22–32)
Calcium: 8.7 mg/dL — ABNORMAL LOW (ref 8.9–10.3)
Creatinine, Ser: 1.23 mg/dL — ABNORMAL HIGH (ref 0.44–1.00)
GFR calc Af Amer: 54 mL/min — ABNORMAL LOW (ref >60)
GFR, EST NON AFRICAN AMERICAN: 47 mL/min — AB (ref >60)
GLUCOSE: 94 mg/dL (ref 70–99)
POTASSIUM: 3.3 mmol/L — AB (ref 3.5–5.1)
Sodium: 139 mmol/L (ref 135–145)

## 2018-04-18 LAB — LACTIC ACID, PLASMA: Lactic Acid, Venous: 0.9 mmol/L (ref 0.5–1.9)

## 2018-04-18 MED ORDER — LEVOTHYROXINE SODIUM 75 MCG PO TABS
150.0000 ug | ORAL_TABLET | Freq: Every day | ORAL | Status: DC
  Administered 2018-04-19 – 2018-04-27 (×9): 150 ug via ORAL
  Filled 2018-04-18 (×9): qty 2

## 2018-04-18 MED ORDER — DILTIAZEM HCL 60 MG PO TABS: 120.0000 mg | ORAL_TABLET | Freq: Three times a day (TID) | ORAL | Status: DC

## 2018-04-18 MED ORDER — DILTIAZEM HCL 60 MG PO TABS
60.0000 mg | ORAL_TABLET | Freq: Four times a day (QID) | ORAL | Status: DC
  Administered 2018-04-18 – 2018-04-19 (×2): 60 mg via ORAL
  Filled 2018-04-18 (×2): qty 1

## 2018-04-18 MED ORDER — POTASSIUM CHLORIDE CRYS ER 20 MEQ PO TBCR
40.0000 meq | EXTENDED_RELEASE_TABLET | ORAL | Status: AC
  Administered 2018-04-18 (×2): 40 meq via ORAL
  Filled 2018-04-18 (×2): qty 2

## 2018-04-18 MED ORDER — DILTIAZEM HCL 60 MG PO TABS: 120.0000 mg | ORAL_TABLET | Freq: Four times a day (QID) | ORAL | Status: DC

## 2018-04-18 NOTE — Progress Notes (Signed)
Pt talking with her son, [name], via telephone. Pt states she is agreeable to take po dilt as ordered.

## 2018-04-18 NOTE — Progress Notes (Signed)
PROGRESS NOTE    [name]  GOT:[id number] DOB: [birth year] DOA: 04/14/2018  PCP: Burnis Medin, MD   Brief Narrative:  62 year old female with past medical history significant for recent hemorrhagic stroke with left-sided weakness, hypertension, hyperlipidemia, COPD, atrial fibrillation not on anticoagulants, CAD, CABG, CHF, OSA, tobacco abuse, GERD, hypothyroidism, depression, who presented with a fall and altered mental status. Hospital course has been complicated by A fib with RVR and HR In 140-170's range. Patient has continued to decline Cardizem drip.  04/18/18: Patient seen alongside patient's nurse.  Patient is still refusing Cardizem drip.  After extensive discussion, the patient has agreed to take Cardizem orally.  Hopefully, this will control the blood pressure on the heart rate.  Patient is still on IV antibiotics for possible UTI.    Assessment & Plan:   Sepsis secondary to UTI - Sepsis criteria met on admission with hypotension, tachypnea and evidence of UTI based on urinalysis (moderate leukocytes and presence of WBC and bacteria) - Pt was on on aztreonem through 04/16/18 - Urine cx and blood cx showed no growth  - Lactic acid 1.92, repeat lactic acid in am - BP 144/93, hypotension resolved with IV fluids and resolution of sepsis  04/18/2017: Complete course of antibiotics.  Acute metabolic encephalopathy - Due to sepsis, UTI - Better mental status this am - CT head with improving hhg CVA since prior study  Hypothyroidism - Continue synthroid 200 mcg daily  04/18/2018: -Last TSH was 0.398. We decreased the dose of Synthroid to 150 MCG  p.o. once daily. Patient will need TSH to be repeated in 4-6 weeks   Chronic diastolic heart failure ([place]) - 2D echo on 03/26/2018 showed EF 50% - Chest x-ray showed mild interstitial edema - CHF compensated   CAD '07, LAD PCI 2012, SVG-PDA PTCA 11/10/14 - S/P of CABG - Continue Lipitor and Aspirin   Hyperlipidemia: -  Continue Atorvastatin 40 mg daily   AKI (acute kidney injury) ([place]) -Likely multifactorial. - Improving  - Follow up BMP in am  COPD (chronic obstructive pulmonary disease) ([facility]) - Use as needed albuterol nebulizer - Stable respiratory status   Essential hypertension  - Continue clonidine 0.1 mg twice daily  - Refused metoprolol   StrokeandIVH (intraventricular hemorrhage) ([facility]) - CT-head showed improvement of hemorrhage and midline shift - Continue aspirin, Lipitor  GERD without esophagitis - Continue pepcid   Paroxysmal atrial Fibrillation - CHA2DS2-VASc Score 6 - Not on AC due to recent hhg stroke - Refused Cardizem drip - She is on metoprolol 25 mg BID but also refused that until she speaks to cardiologist  - On aspirin daily - Cardio consulted  -Patient is refusing Cardizem drip, therefore, will start oral Cardizem.  Continue to monitor heart rate.  Hypokalemia: -Potassium is 3.3 today. -Replete. -Monitor electrolytes.  Depression - Continue Lexapro 10 gm daily  - Continue Wellbutrin   Fall - CT head without acute findings  - Per PT - SNF recommended   Tobacco abuse - Counseled on smoking cessation - Nicotine patch ordered   Diet: heart healthy   DVT prophylaxis: SCD's, aspirin  Code Status: full code  Family Communication: no family at bedside this am Disposition Plan: PT has recommended skilled nursing facility.    Consultants:   Cardiology  Neurology, Dr. Lorraine Lax  Procedures:   None   Antimicrobials:   Aztreonam through 04/16/18   Subjective: No overnight events. Not compliant with medical management (resisting)  Objective: Vitals:   04/18/18 0500 04/18/18  5852 04/18/18 1131 04/18/18 1622  BP: (!) 160/90 (!) 132/92 112/83 (!) 160/128  Pulse: 94 80 86 (!) 56  Resp: 16     Temp: 97.8 F (36.6 C) 97.6 F (36.4 C) 97.8 F (36.6 C) 98.2 F (36.8 C)  TempSrc: Oral Oral Oral Oral  SpO2: 100% 97% 98% 90%  Weight:        Height:        Intake/Output Summary (Last 24 hours) at 04/18/2018 1740 Last data filed at 04/18/2018 0900 Gross per 24 hour  Intake 0 ml  Output -  Net 0 ml   Filed Weights   04/15/18 0550 04/16/18 0449 04/17/18 0508  Weight: 68.6 kg 68.5 kg 70.2 kg    Examination:  General exam: Appears calm and comfortable  Respiratory system: Clear to auscultation. Cardiovascular system: S1 & S2, tachycardic, Irregular rhythm Gastrointestinal system: Abdomen is nondistended, soft and nontender. No organomegaly or masses felt. Normal bowel sounds heard. Central nervous system: Patient is awake and alert.  Patient moves all limbs.   Extremities: No leg edema.    Data Reviewed: I have personally reviewed following labs and imaging studies  CBC: Recent Labs  Lab 04/14/18 2355 04/15/18 0003 04/15/18 0540  WBC 7.7  --  6.5  NEUTROABS 6.3  --   --   HGB 12.8 13.3 12.0  HCT 39.8 39.0 37.8  MCV 103.6*  --  105.3*  PLT 153  --  778*   Basic Metabolic Panel: Recent Labs  Lab 04/14/18 2355 04/15/18 0003 04/15/18 0540 04/18/18 0342  NA 136 137 136 139  K 4.1 4.3 4.3 3.3*  CL 103 106 107 108  CO2 19*  --  14* 25  GLUCOSE 96 94 117* 94  BUN 27* 30* 27* 12  CREATININE 1.52* 1.40* 1.33* 1.23*  CALCIUM 9.0  --  8.5* 8.7*   GFR: Estimated Creatinine Clearance: 46.8 mL/min (A) (by C-G formula based on SCr of 1.23 mg/dL (H)). Liver Function Tests: Recent Labs  Lab 04/14/18 2355  AST 13*  ALT 11  ALKPHOS 80  BILITOT 0.9  PROT 6.4*  ALBUMIN 3.4*   No results for input(s): LIPASE, AMYLASE in the last 168 hours. No results for input(s): AMMONIA in the last 168 hours. Coagulation Profile: Recent Labs  Lab 04/14/18 2355  INR 1.18   Cardiac Enzymes: No results for input(s): CKTOTAL, CKMB, CKMBINDEX, TROPONINI in the last 168 hours. BNP (last 3 results) No results for input(s): PROBNP in the last 8760 hours. HbA1C: No results for input(s): HGBA1C in the last 72  hours. CBG: Recent Labs  Lab 04/14/18 2349  GLUCAP 81   Lipid Profile: No results for input(s): CHOL, HDL, LDLCALC, TRIG, CHOLHDL, LDLDIRECT in the last 72 hours. Thyroid Function Tests: Recent Labs    04/16/18 1819  TSH 0.398   Anemia Panel: No results for input(s): VITAMINB12, FOLATE, FERRITIN, TIBC, IRON, RETICCTPCT in the last 72 hours. Urine analysis:    Component Value Date/Time   COLORURINE AMBER (A) 04/15/2018 0046   APPEARANCEUR HAZY (A) 04/15/2018 0046   LABSPEC 1.021 04/15/2018 0046   PHURINE 5.0 04/15/2018 0046   GLUCOSEU NEGATIVE 04/15/2018 0046   HGBUR SMALL (A) 04/15/2018 0046   BILIRUBINUR NEGATIVE 04/15/2018 0046   BILIRUBINUR neg 05/11/2014 2056   KETONESUR 5 (A) 04/15/2018 0046   PROTEINUR NEGATIVE 04/15/2018 0046   UROBILINOGEN 1.0 04/01/2015 1530   NITRITE NEGATIVE 04/15/2018 0046   LEUKOCYTESUR MODERATE (A) 04/15/2018 0046   Sepsis Labs: _0 (procalcitonin:4,lacticidven:4)  Recent Results (from the past 240 hour(s))  Urine Culture     Status: Abnormal   Collection Time: 04/15/18 12:44 AM  Result Value Ref Range Status   Specimen Description URINE, RANDOM  Final   Special Requests NONE  Final   Culture (A)  Final    <10,000 COLONIES/mL INSIGNIFICANT GROWTH Performed at [facility], [address], [place], [place] [address]    Report Status 04/16/2018 FINAL  Final  Blood Culture (routine x 2)     Status: None (Preliminary result)   Collection Time: 04/15/18  2:07 AM  Result Value Ref Range Status   Specimen Description BLOOD LEFT ANTECUBITAL  Final   Special Requests   Final    BOTTLES DRAWN AEROBIC AND ANAEROBIC Blood Culture results may not be optimal due to an inadequate volume of blood received in culture bottles   Culture   Final    NO GROWTH 3 DAYS Performed at [facility], [place] [address] [place], [place] [address]    Report Status PENDING  Incomplete  Blood Culture (routine x 2)     Status: None (Preliminary  result)   Collection Time: 04/15/18  2:07 AM  Result Value Ref Range Status   Specimen Description BLOOD LEFT HAND  Final   Special Requests   Final    BOTTLES DRAWN AEROBIC ONLY Blood Culture results may not be optimal due to an inadequate volume of blood received in culture bottles   Culture   Final    NO GROWTH 3 DAYS Performed at [facility], [place] [address] [place], [place] [address]    Report Status PENDING  Incomplete      Radiology Studies: Dg Chest 2 View Result Date: 04/15/2018 Cardiomegaly with aortic atherosclerosis and post CABG change. Mild diffuse interstitial prominence may reflect a component of mild interstitial edema.   Ct Angio Chest Pe W And/or Wo Contrast Result Date: 04/15/2018 1. No pulmonary embolus. 2. Mild cardiomegaly. 3. No acute thoracic abnormality.   Ct Head Code Stroke Wo Contrast Result Date: 04/15/2018 1. No acute hemorrhage. 2. Resolving blood products in the left lateral ventricle and encephalomalacia at the site of prior right MCA infarct. 3.    Scheduled Meds: . aspirin EC  81 mg Oral Daily  . atorvastatin  40 mg Oral q1800  . buPROPion  150 mg Oral q morning - 10a  . cloNIDine  0.1 mg Oral BID  . diltiazem  120 mg Oral Q8H  . escitalopram  10 mg Oral Daily  . famotidine  20 mg Oral BID  . fluticasone  2 spray Each Nare Daily  . levothyroxine  200 mcg Oral QAC breakfast  . magnesium oxide  400 mg Oral Daily  . metoprolol tartrate  25 mg Oral BID  . nicotine  21 mg Transdermal Daily  . potassium chloride  40 mEq Oral Q4H  . topiramate  50 mg Oral BID  . cyanocobalamin  1,000 mcg Oral Daily  . Vitamin D (Ergocalciferol)  50,000 Units Oral Q7 days   Continuous Infusions: . sodium chloride 50 mL/hr at 04/16/18 1705     LOS: 3 days    Time spent: 25 minutes   Bonnell Public, MD [facility] Pager [phone].  If 7PM-7AM, please contact night-coverage [email or web address] Password [email or web address] 04/18/2018, 5:40 PM

## 2018-04-18 NOTE — Plan of Care (Signed)
Remains in Afib/Aflutter with RVR. Pt refusing cardiac medicines at this time. Education given re: BB and Calcium Channel Blocker for control of HR. C/O sinus H/A. Agrees today to take APAP, Topamax and Flonase with some relief. Continue to educate and attempt Therapeutic Interventions. Return to SNF pending acceptance of facility.

## 2018-04-18 NOTE — Clinical Social Work Note (Signed)
CSW received phone call from [employer], they have denied patient for SNF placement.  CSW paged physician to see if he is able to do a peer to peer, awaiting decision.  CSW to continue to follow patient's progress throughout discharge planning.  Ervin Knack. Keiosha Cancro, MSW, Theresia Majors [phone]  04/18/2018 12:59 PM

## 2018-04-18 NOTE — Progress Notes (Signed)
Pt still refusing to take her medications. States " I am Fine".  Colleen Can, RN

## 2018-04-18 NOTE — Progress Notes (Signed)
Pt encouraged to take cardiac meds for HTN and Afib/Aflutter with RVR uncontrolled. Pt states she is not taking any cardiac meds until she talks with the MD. Dr. Dartha Lodge into room. Pt refuses IV diltiazem and to be stuck for new IV, but agrees to take PO diltiazem, but she says she needs to talk with her son first.

## 2018-04-19 LAB — RENAL FUNCTION PANEL
Albumin: 2.7 g/dL — ABNORMAL LOW (ref 3.5–5.0)
Anion gap: 9 (ref 5–15)
BUN: 10 mg/dL (ref 6–20)
CO2: 23 mmol/L (ref 22–32)
Calcium: 8.5 mg/dL — ABNORMAL LOW (ref 8.9–10.3)
Chloride: 106 mmol/L (ref 98–111)
Creatinine, Ser: 1.12 mg/dL — ABNORMAL HIGH (ref 0.44–1.00)
GFR calc Af Amer: >60 mL/min (ref >60)
GFR calc non Af Amer: 52 mL/min — ABNORMAL LOW (ref >60)
Glucose, Bld: 100 mg/dL — ABNORMAL HIGH (ref 70–99)
Phosphorus: 3 mg/dL (ref 2.5–4.6)
Potassium: 3.6 mmol/L (ref 3.5–5.1)
Sodium: 138 mmol/L (ref 135–145)

## 2018-04-19 LAB — MAGNESIUM: Magnesium: 1.4 mg/dL — ABNORMAL LOW (ref 1.7–2.4)

## 2018-04-19 MED ORDER — CLONIDINE HCL 0.1 MG PO TABS
0.1000 mg | ORAL_TABLET | Freq: Every day | ORAL | Status: DC
  Administered 2018-04-22: 0.1 mg via ORAL
  Filled 2018-04-19 (×4): qty 1

## 2018-04-19 MED ORDER — MAGNESIUM SULFATE 2 GM/50ML IV SOLN
2.0000 g | Freq: Once | INTRAVENOUS | Status: AC
  Administered 2018-04-19: 2 g via INTRAVENOUS
  Filled 2018-04-19: qty 50

## 2018-04-19 MED ORDER — DILTIAZEM HCL 60 MG PO TABS
60.0000 mg | ORAL_TABLET | Freq: Four times a day (QID) | ORAL | Status: DC
  Administered 2018-04-19 – 2018-04-27 (×22): 60 mg via ORAL
  Filled 2018-04-19 (×24): qty 1

## 2018-04-19 NOTE — Progress Notes (Signed)
Pt was sleeping when I awoke her to take PM medications. Pt initially was just holding pills in med cup. I asked her to go ahead a take them because I couldn't leave them in the room. She continued to sit on the side of the bed holding med cup. I started to ask her again to take meds, she quickly took meds and started cussing at me, telling me she wanted nothing to do with me and to get out of her site. Pt hen started ambulating to door, refused walker, and continued telling me to get away from her, and cussing. Pt was then assisted by NT and charge nurse back into her room. Will continue to monitor. Dierdre Highman, RN

## 2018-04-19 NOTE — Progress Notes (Signed)
Pt took monitor apart(disconnected from lead set and removed battery) at 2210 and has refused to let staff replace. I attempted to reassure patient it was ordered by her doctors to monitor her heart rate. Pt continues to state I do not know what Im talking about and refuses to myself or the NT touch her at this time. CCMD placed pt on standby.. Provider on call text paged per Amion and notified of above.  Will attempt again to replace telemetry. Dierdre Highman, RN

## 2018-04-19 NOTE — Progress Notes (Signed)
Refuses Metoprolol. Pt states that her Cardiologist had stopped this home med.

## 2018-04-19 NOTE — Plan of Care (Signed)
HR improved with PO Cardizem. Now compliant with all medications except Metoprolol which she says her MD told her to stop taking.

## 2018-04-19 NOTE — Progress Notes (Signed)
PROGRESS NOTE    [name]  QZR:[id number] DOB: 31-Jul-1956 DOA: 04/14/2018  PCP: Burnis Medin, MD   Brief Narrative:  62 year old female with past medical history significant for recent hemorrhagic stroke with left-sided weakness, hypertension, hyperlipidemia, COPD, atrial fibrillation not on anticoagulants, CAD, CABG, CHF, OSA, tobacco abuse, GERD, hypothyroidism, depression, who presented with a fall and altered mental status.  Hospital course complicated with A fib with RVR and HR In 140-170's range.  Assessment & Plan:   Sepsis secondary to UTI - Sepsis criteria met on admission with hypotension, tachypnea and evidence of UTI based on urinalysis (moderate leukocytes and presence of WBC and bacteria) - WBC count on 8/7 was WNL - Pt was on on aztreonem through 04/16/18 - Urine cx and blood cx showed no growth  - Lactic acid 1.92, repeat lactic acid on 8/10 was WNL  Hypotension / Essential hypertension  - BP 94/59 this am, (she is on clonidine, metoprolol, cardizem)  - Will change clonidine to once a day regimen instead of twice a day with next dose to be given tomorrow if BP at least 120/80. - Continue metoprolol and Cardizem for HR control   Acute metabolic encephalopathy - Due to sepsis, UTI - CT head with improving hhg CVA since prior study - Mental status much better this am, improved since admission   Hypothyroidism - Continue synthroid 150 mcg daily  - Follow up TSH in outpatient setting  Chronic diastolic heart failure ([place]) - 2D echo on 03/26/2018 showed EF 50% - CHF compensated   CAD '07, LAD PCI 2012, SVG-PDA PTCA 11/10/14 - S/P of CABG - Continue Lipitor and Aspirin   Hyperlipidemia: - Continue Atorvastatin 40 mg daily   AKI (acute kidney injury) ([place]) - Prerenal, due to sepsis - Cr 1.12, improved with hydration   COPD (chronic obstructive pulmonary disease) ([place]) - Use as needed albuterol nebulizer - Stable respiratory status    StrokeandIVH (intraventricular hemorrhage) ([facility]) - CT-head showed improvement of hemorrhage and midline shift - Continue aspirin, Lipitor  GERD without esophagitis - Continue pepcid   Paroxysmal atrial Fibrillation - CHA2DS2-VASc Score 6 - Not on AC due to recent hhg stroke - Continue aspirin - Continue Cardizem and metoprolol  Depression - Continue Lexapro 10 gm daily  - Continue Wellbutrin 150 mg daily   Fall - CT head without acute findings  - Per PT - SNF recommended   Tobacco abuse - Counseled on smoking cessation - Has nicotine patch ordered   Diet: heart healthy   DVT prophylaxis: SCD's Code Status: full code  Family Communication: no family at bedside this am Disposition Plan: start planning for DC to SNF, HR is controlled    Consultants:   Cardiology  Neurology, Dr. Lorraine Lax  Procedures:   None   Antimicrobials:   Aztreonam through 04/16/18   Subjective: No overnight events.  Objective: Vitals:   04/18/18 1949 04/18/18 2209 04/19/18 0244 04/19/18 0402  BP: 126/85 (!) 132/92 104/67 (!) 94/59  Pulse: 80 (!) 102  83  Resp: 17   19  Temp: 98.7 F (37.1 C)   98.4 F (36.9 C)  TempSrc: Oral   Oral  SpO2: 99%   98%  Weight:      Height:        Intake/Output Summary (Last 24 hours) at 04/19/2018 0756 Last data filed at 04/19/2018 0630 Gross per 24 hour  Intake 360 ml  Output 950 ml  Net -590 ml   [employer]  04/15/18 0550 04/16/18 0449 04/17/18 0508  Weight: 68.6 kg 68.5 kg 70.2 kg    Physical exam  Physical Exam  Constitutional: Appears well-developed and well-nourished. No distress.  HENT: Normocephalic. External right and left ear normal. Oropharynx is clear and moist.  Eyes: Conjunctivae and EOM are normal. PERRLA, no scleral icterus.  Neck: Normal ROM. Neck supple. No JVD. No tracheal deviation. No thyromegaly.  CVS: Rate controlled, S1/S2 + Pulmonary: Effort and breath sounds normal, no stridor, rhonchi, wheezes,  rales.  Abdominal: Soft. BS +,  no distension, tenderness, rebound or guarding.  Musculoskeletal: Normal range of motion. No edema and no tenderness.  Lymphadenopathy: No lymphadenopathy noted, cervical, inguinal. Neuro: Alert. Normal reflexes, muscle tone coordination. No cranial nerve deficit. Skin: Skin is warm and dry.  Psychiatric: Normal mood and affect. Behavior, judgment, thought content normal.      Data Reviewed: I have personally reviewed following labs and imaging studies  CBC: Recent Labs  Lab 04/14/18 2355 04/15/18 0003 04/15/18 0540  WBC 7.7  --  6.5  NEUTROABS 6.3  --   --   HGB 12.8 13.3 12.0  HCT 39.8 39.0 37.8  MCV 103.6*  --  105.3*  PLT 153  --  027*   Basic Metabolic Panel: Recent Labs  Lab 04/14/18 2355 04/15/18 0003 04/15/18 0540 04/18/18 0342 04/19/18 0419  NA 136 137 136 139 138  K 4.1 4.3 4.3 3.3* 3.6  CL 103 106 107 108 106  CO2 19*  --  14* 25 23  GLUCOSE 96 94 117* 94 100*  BUN 27* 30* 27* 12 10  CREATININE 1.52* 1.40* 1.33* 1.23* 1.12*  CALCIUM 9.0  --  8.5* 8.7* 8.5*  MG  --   --   --   --  1.4*  PHOS  --   --   --   --  3.0   GFR: Estimated Creatinine Clearance: 51.4 mL/min (A) (by C-G formula based on SCr of 1.12 mg/dL (H)). Liver Function Tests: Recent Labs  Lab 04/14/18 2355 04/19/18 0419  AST 13*  --   ALT 11  --   ALKPHOS 80  --   BILITOT 0.9  --   PROT 6.4*  --   ALBUMIN 3.4* 2.7*   No results for input(s): LIPASE, AMYLASE in the last 168 hours. No results for input(s): AMMONIA in the last 168 hours. Coagulation Profile: Recent Labs  Lab 04/14/18 2355  INR 1.18   Cardiac Enzymes: No results for input(s): CKTOTAL, CKMB, CKMBINDEX, TROPONINI in the last 168 hours. BNP (last 3 results) No results for input(s): PROBNP in the last 8760 hours. HbA1C: No results for input(s): HGBA1C in the last 72 hours. CBG: Recent Labs  Lab 04/14/18 2349  GLUCAP 81   Lipid Profile: No results for input(s): CHOL, HDL,  LDLCALC, TRIG, CHOLHDL, LDLDIRECT in the last 72 hours. Thyroid Function Tests: Recent Labs    04/16/18 1819  TSH 0.398   Anemia Panel: No results for input(s): VITAMINB12, FOLATE, FERRITIN, TIBC, IRON, RETICCTPCT in the last 72 hours. Urine analysis:    Component Value Date/Time   COLORURINE AMBER (A) 04/15/2018 0046   APPEARANCEUR HAZY (A) 04/15/2018 0046   LABSPEC 1.021 04/15/2018 0046   PHURINE 5.0 04/15/2018 0046   GLUCOSEU NEGATIVE 04/15/2018 0046   HGBUR SMALL (A) 04/15/2018 0046   BILIRUBINUR NEGATIVE 04/15/2018 0046   BILIRUBINUR neg 05/11/2014 2056   KETONESUR 5 (A) 04/15/2018 0046   PROTEINUR NEGATIVE 04/15/2018 0046   UROBILINOGEN 1.0 04/01/2015  [place] 04/15/2018 0046   LEUKOCYTESUR MODERATE (A) 04/15/2018 0046   Sepsis Labs: _0 (procalcitonin:4,lacticidven:4)   Recent Results (from the past 240 hour(s))  Urine Culture     Status: Abnormal   Collection Time: 04/15/18 12:44 AM  Result Value Ref Range Status   Specimen Description URINE, RANDOM  Final   Special Requests NONE  Final   Culture (A)  Final    <10,000 COLONIES/mL INSIGNIFICANT GROWTH Performed at [facility], [place] [address] [place], [place] [address]    Report Status 04/16/2018 FINAL  Final  Blood Culture (routine x 2)     Status: None (Preliminary result)   Collection Time: 04/15/18  2:07 AM  Result Value Ref Range Status   Specimen Description BLOOD LEFT ANTECUBITAL  Final   Special Requests   Final    BOTTLES DRAWN AEROBIC AND ANAEROBIC Blood Culture results may not be optimal due to an inadequate volume of blood received in culture bottles   Culture   Final    NO GROWTH 3 DAYS Performed at [facility], [place] [address] [place], [place] [address]    Report Status PENDING  Incomplete  Blood Culture (routine x 2)     Status: None (Preliminary result)   Collection Time: 04/15/18  2:07 AM  Result Value Ref Range Status   Specimen Description BLOOD LEFT  HAND  Final   Special Requests   Final    BOTTLES DRAWN AEROBIC ONLY Blood Culture results may not be optimal due to an inadequate volume of blood received in culture bottles   Culture   Final    NO GROWTH 3 DAYS Performed at [facility], [place] [address] [place], [place] [address]    Report Status PENDING  Incomplete      Radiology Studies: Dg Chest 2 View Result Date: 04/15/2018 Cardiomegaly with aortic atherosclerosis and post CABG change. Mild diffuse interstitial prominence may reflect a component of mild interstitial edema.   Ct Angio Chest Pe W And/or Wo Contrast Result Date: 04/15/2018 1. No pulmonary embolus. 2. Mild cardiomegaly. 3. No acute thoracic abnormality.   Ct Head Code Stroke Wo Contrast Result Date: 04/15/2018 1. No acute hemorrhage. 2. Resolving blood products in the left lateral ventricle and encephalomalacia at the site of prior right MCA infarct. 3.    Scheduled Meds: . aspirin EC  81 mg Oral Daily  . atorvastatin  40 mg Oral q1800  . buPROPion  150 mg Oral q morning - 10a  . cloNIDine  0.1 mg Oral BID  . diltiazem  60 mg Oral Q6H  . escitalopram  10 mg Oral Daily  . famotidine  20 mg Oral BID  . fluticasone  2 spray Each Nare Daily  . levothyroxine  150 mcg Oral QAC breakfast  . magnesium oxide  400 mg Oral Daily  . metoprolol tartrate  25 mg Oral BID  . nicotine  21 mg Transdermal Daily  . topiramate  50 mg Oral BID  . cyanocobalamin  1,000 mcg Oral Daily  . Vitamin D (Ergocalciferol)  50,000 Units Oral Q7 days   Continuous Infusions: . sodium chloride 50 mL/hr at 04/16/18 1705  . magnesium sulfate 1 - 4 g bolus IVPB       LOS: 4 days    Time spent: 25 minutes Greater than 50% of the time spent on counseling and coordinating the care.   Leisa Lenz, MD [facility] Pager [phone]  If 7PM-7AM,  please contact night-coverage [email or web address] Password [facility] 04/19/2018, 7:56 AM

## 2018-04-19 NOTE — Progress Notes (Signed)
CSW following for discharge planning. Per chart review, patient's request for SNF has been denied through [facility]. MD will need to do peer to peer for insurance authorization, if interested. CSW paged MD and provided information for completing peer to peer.  CSW to continue to follow.  Blenda Nicely, [place] Clinical Social Worker [phone]

## 2018-04-20 ENCOUNTER — Other Ambulatory Visit: Payer: Self-pay

## 2018-04-20 DIAGNOSIS — F17210 Nicotine dependence, cigarettes, uncomplicated: Secondary | ICD-10-CM

## 2018-04-20 DIAGNOSIS — R4182 Altered mental status, unspecified: Secondary | ICD-10-CM

## 2018-04-20 DIAGNOSIS — N390 Urinary tract infection, site not specified: Secondary | ICD-10-CM

## 2018-04-20 DIAGNOSIS — Z008 Encounter for other general examination: Secondary | ICD-10-CM

## 2018-04-20 LAB — BASIC METABOLIC PANEL
Anion gap: 6 (ref 5–15)
BUN: 10 mg/dL (ref 6–20)
CHLORIDE: 109 mmol/L (ref 98–111)
CO2: 22 mmol/L (ref 22–32)
Calcium: 8.5 mg/dL — ABNORMAL LOW (ref 8.9–10.3)
Creatinine, Ser: 1.09 mg/dL — ABNORMAL HIGH (ref 0.44–1.00)
GFR calc Af Amer: >60 mL/min (ref >60)
GFR, EST NON AFRICAN AMERICAN: 54 mL/min — AB (ref >60)
GLUCOSE: 97 mg/dL (ref 70–99)
POTASSIUM: 3.4 mmol/L — AB (ref 3.5–5.1)
Sodium: 137 mmol/L (ref 135–145)

## 2018-04-20 LAB — CULTURE, BLOOD (ROUTINE X 2)
CULTURE: NO GROWTH
Culture: NO GROWTH

## 2018-04-20 LAB — CBC
HEMATOCRIT: 35.2 % — AB (ref 36.0–46.0)
HEMOGLOBIN: 11.6 g/dL — AB (ref 12.0–15.0)
MCH: 33.1 pg (ref 26.0–34.0)
MCHC: 33 g/dL (ref 30.0–36.0)
MCV: 100.6 fL — AB (ref 78.0–100.0)
Platelets: 163 10*3/uL (ref 150–400)
RBC: 3.5 MIL/uL — AB (ref 3.87–5.11)
RDW: 11.9 % (ref 11.5–15.5)
WBC: 3.5 10*3/uL — ABNORMAL LOW (ref 4.0–10.5)

## 2018-04-20 LAB — MAGNESIUM: Magnesium: 1.9 mg/dL (ref 1.7–2.4)

## 2018-04-20 MED ORDER — DILTIAZEM HCL 60 MG PO TABS: 60.0000 mg | ORAL_TABLET | Freq: Four times a day (QID) | ORAL | 0 refills | Status: DC

## 2018-04-20 MED ORDER — POTASSIUM CHLORIDE CRYS ER 20 MEQ PO TBCR
40.0000 meq | EXTENDED_RELEASE_TABLET | Freq: Once | ORAL | Status: AC
  Administered 2018-04-20: 40 meq via ORAL
  Filled 2018-04-20: qty 2

## 2018-04-20 NOTE — Progress Notes (Deleted)
NEUROLOGY CONSULTATION NOTE  [name] MRN: [id number] DOB: ***  Referring provider: *** Primary care provider: ***  Reason for consult:  ***  HISTORY OF PRESENT ILLNESS: ***.  Records and images were personally reviewed where available.  ***.  PAST MEDICAL HISTORY: Past Medical History:  Diagnosis Date  . Acute right MCA stroke (HCC) 11/07/10  . Atrial fibrillation (HCC)    a. s/p TEE-DCCV 02/2104; b. Xarelto started  . CAD (coronary artery disease)    a.  cath 09/2010: LAD stent patent, S-Int/dCFX ok, S-PDA ok, L-LAD atretic;  b. Lexiscan Myoview (02/2014):  no ischemia, EF 55%; c. 11/2014 Cath/PCI: LM nl, LAD 20pISR, LCX 80-32m, OM1 nl, RI 70p, RCA 40-90m, RPDA 95ost/95-56m (PTCA only w/ reduction to 50p/54m), 60d, L->LAD atretic, VG->RI->OM nl, VG->PDA 100p.  . Cardiomyopathy with EF 40% at TEE 02/17/14 (likely tachycardia mediated - Myoview 02/19/14 neg for ischemia with normal EF) 02/18/2014  . COPD (chronic obstructive pulmonary disease) (HCC)   . Depression   . Eczema   . GERD (gastroesophageal reflux disease)   . Headache   . HLD (hyperlipidemia)   . Homelessness 11/12/2011  . 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 Hx of CABG   . Hx of transesophageal echocardiography (TEE) for monitoring 11/2010   TEE 11/2010: EF 60-65%, BAE, trivial atrial septal shunt;  right heart cath in 10/2010 with elevated R and L heart pressures and diuretic started  . Hypertension   . Hypothyroidism   . Persistent atrial fibrillation (HCC) 10/2014  . Pulmonary nodules    repeat CT due in 11/2011  . Sleep apnea    recent sleep study  in 04/2014 per chart review  shows no significant OSA    PAST SURGICAL HISTORY: Past Surgical History:  Procedure Laterality Date  . bilateral knee surgery    . BLADDER SURGERY    . CARDIOVERSION N/A 02/17/2014   Procedure: CARDIOVERSION;  Surgeon: Pricilla Riffle, MD;   Location: [facility] ENDOSCOPY;  Service: Cardiovascular;  Laterality: N/A;  . CARDIOVERSION N/A 09/20/2016   Procedure: CARDIOVERSION;  Surgeon: Lars Masson, MD;  Location: [facility] ENDOSCOPY;  Service: Cardiovascular;  Laterality: N/A;  . cath 2012    . CHEST WALL RECONSTRUCTION    . CORONARY ARTERY BYPASS GRAFT    . debriment for infection in chest    . HERNIA REPAIR    . LEFT AND RIGHT HEART CATHETERIZATION WITH CORONARY ANGIOGRAM N/A 11/10/2014   Procedure: LEFT AND RIGHT HEART CATHETERIZATION WITH CORONARY ANGIOGRAM;  Surgeon: Marykay Lex, MD;  Location: [facility] CATH LAB;  Service: Cardiovascular;  Laterality: N/A;  . Left mastoidectomy    . TEE WITHOUT CARDIOVERSION N/A 02/17/2014   Procedure: TRANSESOPHAGEAL ECHOCARDIOGRAM (TEE);  Surgeon: Pricilla Riffle, MD;  Location: [facility] ENDOSCOPY;  Service: Cardiovascular;  Laterality: N/A;    MEDICATIONS: Current Facility-Administered Medications on File Prior to Visit  Medication Dose Route Frequency Provider Last Rate Last Dose  . 0.9 %  sodium chloride infusion   Intravenous Continuous Berton Mount I, MD 50 mL/hr at 04/16/18 1705    . acetaminophen (TYLENOL) tablet 650 mg  650 mg Oral Q6H PRN [name], MD   650 mg at 04/19/18 1802  . albuterol (PROVENTIL) (2.5 MG/3ML) 0.083% nebulizer solution 2.5 mg  2.5 mg Nebulization Q4H PRN [name], MD      .  aspirin EC tablet 81 mg  81 mg Oral Daily [name], MD   81 mg at 04/19/18 0845  . atorvastatin (LIPITOR) tablet 40 mg  40 mg Oral q1800 [name], MD   40 mg at 04/19/18 1657  . buPROPion (WELLBUTRIN XL) 24 hr tablet 150 mg  150 mg Oral q morning - 10a [name], MD   150 mg at 04/19/18 0846  . cloNIDine (CATAPRES) tablet 0.1 mg  0.1 mg Oral Daily Alison Murray, MD      . diltiazem (CARDIZEM) tablet 60 mg  60 mg Oral Q6H Berton Mount I, MD   60 mg at 04/19/18 2142  . escitalopram (LEXAPRO) tablet 10 mg  10 mg Oral Daily [name], MD   10 mg at 04/19/18 0846  . famotidine (PEPCID) tablet 20  mg  20 mg Oral BID [name], MD   20 mg at 04/19/18 2142  . fluticasone (FLONASE) 50 MCG/ACT nasal spray 2 spray  2 spray Each Nare Daily [name], MD   2 spray at 04/19/18 0844  . hydrALAZINE (APRESOLINE) injection 5 mg  5 mg Intravenous Q2H PRN [name], MD      . levothyroxine (SYNTHROID, LEVOTHROID) tablet 150 mcg  150 mcg Oral QAC breakfast Berton Mount I, MD   150 mcg at 04/19/18 0845  . magnesium oxide (MAG-OX) tablet 400 mg  400 mg Oral Daily [name], MD   400 mg at 04/19/18 0844  . metoprolol tartrate (LOPRESSOR) tablet 25 mg  25 mg Oral BID Berton Mount I, MD   25 mg at 04/18/18 2209  . nicotine (NICODERM CQ - dosed in mg/24 hours) patch 21 mg  21 mg Transdermal Daily [name], MD   21 mg at 04/16/18 0826  . ondansetron (ZOFRAN) tablet 4 mg  4 mg Oral Q6H PRN [name], MD       Or  . ondansetron [facility]) injection 4 mg  4 mg Intravenous Q6H PRN [name], MD      . topiramate (TOPAMAX) tablet 50 mg  50 mg Oral BID [name], MD   50 mg at 04/19/18 2142  . vitamin B-12 (CYANOCOBALAMIN) tablet 1,000 mcg  1,000 mcg Oral Daily [name], MD   1,000 mcg at 04/19/18 0845  . Vitamin D (Ergocalciferol) (DRISDOL) capsule 50,000 Units  50,000 Units Oral Q7 days [name], MD      . zolpidem (AMBIEN) tablet 5 mg  5 mg Oral QHS PRN [name], MD       Current Outpatient Medications on File Prior to Visit  Medication Sig Dispense Refill  . acetaminophen (TYLENOL) 325 MG tablet Take 650 mg by mouth every 6 (six) hours as needed for mild pain.    [name] aspirin EC 81 MG EC tablet Take 1 tablet (81 mg total) by mouth daily.    [name] atorvastatin (LIPITOR) 40 MG tablet Take 1 tablet (40 mg total) by mouth daily at 6 PM.    . buPROPion (WELLBUTRIN XL) 150 MG 24 hr tablet Take 1 tablet (150 mg total) by mouth every morning.    . butalbital-acetaminophen-caffeine (FIORICET, ESGIC) 50-325-40 MG tablet Take 1 tablet by mouth every 8 (eight) hours as needed for headache. 14 tablet 0  .  cloNIDine (CATAPRES) 0.1 MG tablet Take 1 tablet (0.1 mg total) by mouth 2 (two) times daily. 60 tablet 11  . diltiazem (CARDIZEM SR) 120 MG 12 hr capsule Take 1 capsule (120 mg total) by mouth 2 (  two) times daily.    [name] escitalopram (LEXAPRO) 10 MG tablet Take 1 tablet (10 mg total) by mouth daily. 90 tablet 1  . fluticasone (FLONASE) 50 MCG/ACT nasal spray Place 2 sprays into both nostrils daily. 48 g 1  . levothyroxine (SYNTHROID, LEVOTHROID) 200 MCG tablet Take 1 tablet (200 mcg total) by mouth daily before breakfast.    . lisinopril (PRINIVIL,ZESTRIL) 10 MG tablet Take 1 tablet (10 mg total) by mouth daily.    . magnesium oxide (MAG-OX) 400 MG tablet Take 1 tablet (400 mg total) by mouth daily. 180 tablet 1  . metoprolol tartrate (LOPRESSOR) 25 MG tablet Take 1 tablet (25 mg total) by mouth 2 (two) times daily.    . mirtazapine (REMERON) 15 MG tablet Take 15 mg by mouth at bedtime.    [name] PROAIR HFA 108 (90 Base) MCG/ACT inhaler INHALE 2 PUFFS BY MOUTH EVERY 6 HOURS AS NEEDED FOR SHORTNESS OF BREATH 1 Inhaler 0  . ranitidine (ZANTAC) 150 MG tablet Take 1 tablet (150 mg total) by mouth 2 (two) times daily. 60 tablet 1  . topiramate (TOPAMAX) 50 MG tablet Take 1 tablet (50 mg total) by mouth 2 (two) times daily. 180 tablet 1  . vitamin B-12 1000 MCG tablet Take 1 tablet (1,000 mcg total) by mouth daily.    . Vitamin D, Ergocalciferol, (DRISDOL) 50000 units CAPS capsule Take 50,000 Units by mouth every 7 (seven) days. On Friday      ALLERGIES: Allergies  Allergen Reactions  . Avelox [Moxifloxacin Hcl In Nacl] Other (See Comments)    Mental breakdown.  [name] [Nortriptyline Hcl] Other (See Comments)    Made her want to hurt herself  . Amoxicillin Hives  . Hydrocodone Itching  . Oxycodone Itching  . Penicillins Hives       . Sulfa Antibiotics Other (See Comments)    unknown    FAMILY HISTORY: Family History  Problem Relation Age of Onset  . COPD Mother   . Heart disease Mother     . Arthritis Mother        Rheumatoid and PMR  . Osteoporosis Mother        Mom fractured hip  . Diabetes type II Mother   . Depression Mother   . Heart attack Father   . Depression Father   . Hypertension Father   . Alcohol abuse Father   . Depression Sister   . Anxiety disorder Sister   . Drug abuse Sister   . Stroke Neg Hx    ***.  SOCIAL HISTORY: Social History   Socioeconomic History  . Marital status: Divorced    Spouse name: Not on file  . Number of children: 2  . Years of education: Not on file  . Highest education level: Not on file  Occupational History  . Occupation: DISABLE   Social Needs  . Financial resource strain: Not on file  . Food insecurity:    Worry: Not on file    Inability: Not on file  . Transportation needs:    Medical: Not on file    Non-medical: Not on file  Tobacco Use  . Smoking status: Current Every Day Smoker    Packs/day: 0.50    Years: 30.00    Pack years: 15.00    Types: Cigarettes    Last attempt to quit: 09/10/2014    Years since quitting: 3.6  . Smokeless tobacco: Never Used  Substance and Sexual Activity  . Alcohol use: No  Alcohol/week: 0.0 standard drinks  . Drug use: No  . Sexual activity: Not Currently  Lifestyle  . Physical activity:    Days per week: Not on file    Minutes per session: Not on file  . Stress: Not on file  Relationships  . Social connections:    Talks on phone: Not on file    Gets together: Not on file    Attends religious service: Not on file    Active member of club or organization: Not on file    Attends meetings of clubs or organizations: Not on file    Relationship status: Not on file  . Intimate partner violence:    Fear of current or ex partner: Not on file    Emotionally abused: Not on file    Physically abused: Not on file    Forced sexual activity: Not on file  Other Topics Concern  . Not on file  Social History Narrative   Divorced   After stroke was living with sister and  mother after sister asked her to leave. Mom has since deceased    Difficulties over control.    Then moved back in for about 6 weeks.   . tranportaion difficult son  helping   Ex-smoker   Was living at friends  Sister and her had argument and she was told to leave .    She was living in a homeless shelter for the last 3 weeks [facility]    son had help with transportation and medication       Work status regular  before got sick on short-term disability now lost t insurance after the stroke and couldn't work was  denied ssi    2 times.  She is not eligible for Medicaid because she doesn't have dependent children.         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   Lives in  rented house 2 house mates males keep tp self has her own b room  Doing well     REVIEW OF SYSTEMS: Constitutional: No fevers, chills, or sweats, no generalized fatigue, change in appetite Eyes: No visual changes, double vision, eye pain Ear, nose and throat: No hearing loss, ear pain, nasal congestion, sore throat Cardiovascular: No chest pain, palpitations Respiratory:  No shortness of breath at rest or with exertion, wheezes GastrointestinaI: No nausea, vomiting, diarrhea, abdominal pain, fecal incontinence Genitourinary:  No dysuria, urinary retention or frequency Musculoskeletal:  No neck pain, back pain Integumentary: No rash, pruritus, skin lesions Neurological: as above Psychiatric: No depression, insomnia, anxiety Endocrine: No palpitations, fatigue, diaphoresis, mood swings, change in appetite, change in weight, increased thirst Hematologic/Lymphatic:  No purpura, petechiae. Allergic/Immunologic: no itchy/runny eyes, nasal congestion, recent allergic reactions, rashes  PHYSICAL EXAM: There  were no vitals filed for this visit. General: No acute distress.  Patient appears ***-groomed.  *** Head:  Normocephalic/atraumatic Eyes:  fundi examined but not visualized Neck: supple, no paraspinal tenderness, full range of motion Back: No paraspinal tenderness Heart: regular rate and rhythm Lungs: Clear to auscultation bilaterally. Vascular: No carotid bruits. Neurological Exam: Mental status: alert and oriented to person, place, and time, recent and remote memory intact, fund of knowledge intact, attention and concentration intact, speech fluent and  not dysarthric, language intact. Cranial nerves: CN I: not tested CN II: pupils equal, round and reactive to light, visual fields intact CN III, IV, VI:  full range of motion, no nystagmus, no ptosis CN V: facial sensation intact CN VII: upper and lower face symmetric CN VIII: hearing intact CN IX, X: gag intact, uvula midline CN XI: sternocleidomastoid and trapezius muscles intact CN XII: tongue midline Bulk & Tone: normal, no fasciculations. Motor:  5/5 throughout *** Sensation:  Pinprick *** temperature *** and vibration sensation intact.  ***. Deep Tendon Reflexes:  2+ throughout, *** toes downgoing.  *** Finger to nose testing:  Without dysmetria.  *** Heel to shin:  Without dysmetria.  *** Gait:  Normal station and stride.  Able to turn and tandem walk. Romberg ***.  IMPRESSION: ***  PLAN: ***  Thank you for allowing me to take part in the care of this patient.  Metta Clines, DO  CC: ***

## 2018-04-20 NOTE — Care Management Important Message (Signed)
Important Message  Patient Details  Name: [name] MRN: [id number] Date of Birth: May 14, 1956   Medicare Important Message Given:  Yes Letter left with patiett. Refused to sign at this time.   Atlee Villers P Obed Samek 04/20/2018, 4:20 PM

## 2018-04-20 NOTE — Discharge Summary (Signed)
Physician Discharge Summary  [name] UKG:[id number] DOB: 1956-01-25 DOA: 04/14/2018  PCP: Burnis Medin, MD  Admit date: 04/14/2018 Discharge date: 04/20/2018  Recommendations for Outpatient Follow-up:  Check CBC and BMP during next visit to PCP Continue Cardizem and metoprolol for heart rate control. Hold off on clonidine and lisinopril due to risk of hypotension.   Discharge Diagnoses:  Principal Problem:   UTI (urinary tract infection) Active Problems:   Hypothyroidism   Chronic diastolic heart failure ([place])   CAD '07, LAD PCI 2012, SVG-PDA PTCA 11/10/14   Hyperlipidemia   AKI (acute kidney injury) ([place])   Hypotension   COPD (chronic obstructive pulmonary disease) (HCC)   HTN (hypertension)   Smoker   Stroke (cerebrum) (HCC)   IVH (intraventricular hemorrhage) (HCC)   GERD (gastroesophageal reflux disease)   Atrial fibrillation, chronic (HCC)   Depression   Fall   Tobacco abuse   Sepsis ([place])   Acute metabolic encephalopathy    Discharge Condition: stable   Diet recommendation: as tolerated   History of present illness:  62 year old female with past medical history significant forrecent hemorrhagic stroke with left-sided weakness, hypertension, hyperlipidemia, COPD, atrial fibrillation not on anticoagulants, CAD, CABG, CHF, OSA, tobacco abuse, GERD, hypothyroidism, depression, who presented with a fall and altered mental status.  Hospital course complicated with A fib with RVR and HR In 140-170's range.  Hospital Course:   Assessment & Plan:   Sepsis secondary to UTI - Sepsis criteria met on admission with hypotension, tachypnea and evidence of UTI based on urinalysis (moderate leukocytes and presence of WBC and bacteria) - WBC count on 8/7 was WNL - Pt was on on aztreonem through 04/16/18 - Urine cx and blood cx showed no growth  - Lactic acid 1.92, repeat lactic acid on 8/10 was WNL  Hypotension / Essential hypertension  - Continue metoprolol and  Cardizem for HR control  - Hold off on clonidine and lisinopril due to risk of hypotension.  Acute metabolic encephalopathy - Due to sepsis, UTI - CT head with improving hhg CVA since prior study - Mental status much better this am, improved since admission   Hypothyroidism - Continue synthroid 150 mcg daily  - Follow up TSH in outpatient setting  Chronic diastolic heart failure ([place]) - 2D echo on 03/26/2018 showed EF 50% - CHF compensated   CAD '07, LAD PCI 2012, SVG-PDA PTCA 11/10/14 - S/P of CABG - Continue Lipitor and Aspirin   Hyperlipidemia: - Continue Atorvastatin 40 mg daily   AKI (acute kidney injury) ([place]) - Prerenal, due to sepsis - Cr 1.12, improved with hydration   COPD (chronic obstructive pulmonary disease) (HCC) - Use as needed albuterol nebulizer - Stable respiratory status   StrokeandIVH (intraventricular hemorrhage) (HCC) - CT-head showed improvement of hemorrhage and midline shift - Continue aspirin, Lipitor  GERD without esophagitis - Continue pepcid   Paroxysmal atrial Fibrillation - CHA2DS2-VASc Score 6 - Not on AC due to recent hhg stroke - Continue aspirin - Continue Cardizem and metoprolol  Depression - Continue Lexapro 10 gm daily  - Continue Wellbutrin 150 mg daily   Fall - CT head without acute findings  - Per PT - SNF recommended - declined SNF placement   Tobacco abuse - Counseled on smoking cessation - Has nicotine patch ordered   Diet: heart healthy   DVT prophylaxis: SCD's Code Status: full code  Family Communication: no family at bedside this am    Consultants:   Cardiology  Neurology, Dr. Lorraine Lax  Procedures:   None   Antimicrobials:   Aztreonam through 04/16/18   Signed:  Leisa Lenz, MD  [facility] 04/20/2018, 9:14 AM  Pager #: [phone]  Time spent in minutes:35 minutes    Discharge Exam: Vitals:   04/19/18 1952 04/20/18 0601  BP: 107/71 100/74  Pulse: 69 86   Resp: 16 16  Temp:  98.3 F (36.8 C)  SpO2: 100% 100%   Vitals:   04/19/18 1235 04/19/18 1640 04/19/18 1952 04/20/18 0601  BP: 92/66 (!) 128/94 107/71 100/74  Pulse: 81 (!) 48 69 86  Resp:   16 16  Temp: 97.9 F (36.6 C)  98.3 F (36.8 C) 98.3 F (36.8 C)  TempSrc: Oral  Oral Oral  SpO2: 100% 100% 100% 100%  Weight:    68.9 kg  Height:        General: Pt is alert, follows commands appropriately, not in acute distress Cardiovascular: Rate controlled, S1/S2 + Respiratory: Clear to auscultation bilaterally, no wheezing, no crackles, no rhonchi Abdominal: Soft, non tender, non distended, bowel sounds + Extremities: no edema, no cyanosis, pulses palpable bilaterally DP and PT Neuro: Grossly nonfocal  Discharge Instructions  Discharge Instructions    Call MD for:  persistant nausea and vomiting   Complete by:  As directed    Call MD for:  redness, tenderness, or signs of infection (pain, swelling, redness, odor or green/yellow discharge around incision site)   Complete by:  As directed    Call MD for:  severe uncontrolled pain   Complete by:  As directed    Diet - low sodium heart healthy   Complete by:  As directed    Discharge instructions   Complete by:  As directed    Continue Cardizem and metoprolol for heart rate control. Hold off on clonidine and lisinopril due to risk of hypotension.   Increase activity slowly   Complete by:  As directed      Allergies as of 04/20/2018      Reactions   Avelox [moxifloxacin Hcl In Nacl] Other (See Comments)   Mental breakdown.   Pamelor [nortriptyline Hcl] Other (See Comments)   Made her want to hurt herself   Amoxicillin Hives   Hydrocodone Itching   Oxycodone Itching   Penicillins Hives      Sulfa Antibiotics Other (See Comments)   unknown      Medication List    STOP taking these medications   cloNIDine 0.1 MG tablet Commonly known as:  CATAPRES   diltiazem 120 MG 12 hr capsule Commonly known as:  CARDIZEM  SR Replaced by:  diltiazem 60 MG tablet   lisinopril 10 MG tablet Commonly known as:  PRINIVIL,ZESTRIL     TAKE these medications   acetaminophen 325 MG tablet Commonly known as:  TYLENOL Take 650 mg by mouth every 6 (six) hours as needed for mild pain.   aspirin 81 MG EC tablet Take 1 tablet (81 mg total) by mouth daily.   atorvastatin 40 MG tablet Commonly known as:  LIPITOR Take 1 tablet (40 mg total) by mouth daily at 6 PM.   buPROPion 150 MG 24 hr tablet Commonly known as:  WELLBUTRIN XL Take 1 tablet (150 mg total) by mouth every morning.   butalbital-acetaminophen-caffeine 50-325-40 MG tablet Commonly known as:  FIORICET, ESGIC Take 1 tablet by mouth every 8 (eight) hours as needed for headache.   cyanocobalamin 1000 MCG tablet Take 1 tablet (1,000 mcg total) by mouth daily.   diltiazem  60 MG tablet Commonly known as:  CARDIZEM Take 1 tablet (60 mg total) by mouth every 6 (six) hours. Replaces:  diltiazem 120 MG 12 hr capsule   escitalopram 10 MG tablet Commonly known as:  LEXAPRO Take 1 tablet (10 mg total) by mouth daily.   fluticasone 50 MCG/ACT nasal spray Commonly known as:  FLONASE Place 2 sprays into both nostrils daily.   levothyroxine 200 MCG tablet Commonly known as:  SYNTHROID, LEVOTHROID Take 1 tablet (200 mcg total) by mouth daily before breakfast.   magnesium oxide 400 MG tablet Commonly known as:  MAG-OX Take 1 tablet (400 mg total) by mouth daily.   metoprolol tartrate 25 MG tablet Commonly known as:  LOPRESSOR Take 1 tablet (25 mg total) by mouth 2 (two) times daily.   mirtazapine 15 MG tablet Commonly known as:  REMERON Take 15 mg by mouth at bedtime.   PROAIR HFA 108 (90 Base) MCG/ACT inhaler Generic drug:  albuterol INHALE 2 PUFFS BY MOUTH EVERY 6 HOURS AS NEEDED FOR SHORTNESS OF BREATH   ranitidine 150 MG tablet Commonly known as:  ZANTAC Take 1 tablet (150 mg total) by mouth 2 (two) times daily.   topiramate 50 MG  tablet Commonly known as:  TOPAMAX Take 1 tablet (50 mg total) by mouth 2 (two) times daily.   Vitamin D (Ergocalciferol) 50000 units Caps capsule Commonly known as:  DRISDOL Take 50,000 Units by mouth every 7 (seven) days. On Friday       Contact information for follow-up providers    Panosh, Standley Brooking, MD. Schedule an appointment as soon as possible for a visit in 1 week(s).   Specialties:  Internal Medicine, Pediatrics Contact information: [place] [address] [phone]        Dorothy Spark, MD .   Specialty:  Cardiology Contact information: [place] [address] [phone]            Contact information for after-discharge care    Destination    [facility] Preferred SNF .   Service:  Skilled Nursing Contact information: [address] [place] [phone]                   The results of significant diagnostics from this hospitalization (including imaging, microbiology, ancillary and laboratory) are listed below for reference.    Significant Diagnostic Studies: Dg Chest 2 View  Result Date: 04/15/2018 CLINICAL DATA:  Unwitnessed fall with facial droop and slurred speech. EXAM: CHEST - 2 VIEW COMPARISON:  03/25/2018 FINDINGS: Patient is status post CABG. Nonaneurysmal atherosclerotic aorta is noted. There is stable cardiomegaly with diffuse interstitial prominence suspicious for mild interstitial edema. No effusion or pneumothorax. Degenerative changes are present along dorsal spine. Surgical clips project over the right upper thorax. IMPRESSION: Cardiomegaly with aortic atherosclerosis and post CABG change. Mild diffuse interstitial prominence may reflect a component of mild interstitial edema. Electronically Signed   By: Ashley Royalty M.D.   On: 04/15/2018 01:23   Dg Chest 2 View  Result Date: 03/25/2018 CLINICAL DATA:  62 year old female with a history of  dyspnea and headache EXAM: CHEST - 2 VIEW COMPARISON:  03/18/2018 FINDINGS: Cardiomediastinal silhouette unchanged in size and contour with surgical changes of prior CABG. Coarsened interstitial markings bilaterally. Lateral view demonstrates blunting of the costophrenic sulcus. Surgical changes of the right chest again demonstrated. No confluent airspace disease. No acute displaced fracture IMPRESSION: Chronic lung changes, with  either scarring or small pleural effusion on the lateral view. No evidence of lobar pneumonia. Surgical changes of prior CABG. Electronically Signed   By: [name] D.O.   On: 03/25/2018 19:45   Ct Head Wo Contrast  Result Date: 03/25/2018 CLINICAL DATA:  Mental status changes. Hypertension. Left-sided deficits from prior stroke. EXAM: CT HEAD WITHOUT CONTRAST TECHNIQUE: Contiguous axial images were obtained from the base of the skull through the vertex without intravenous contrast. COMPARISON:  Multiple exams, including 03/19/2018 FINDINGS: Brain: Right anterior middle cerebral artery distribution encephalomalacia with distribution of involvement similar to 03/19/2018. The brainstem and cerebellum appear unremarkable. Again observed is intraventricular hemorrhage filling most of the left lateral ventricle. There has been some clock contraction/reduction in overall volume, and the temporal horn of the left lateral ventricle is not dilated. Also the blood products in the third ventricle and in the right lateral ventricle appear to have intervally cleared. 6 mm of right-to-left midline shift, formerly 8 mm. Periventricular white matter and corona radiata hypodensities favor chronic ischemic microvascular white matter disease. No new hemorrhage is identified. Vascular: There is atherosclerotic calcification of the cavernous carotid arteries bilaterally. Skull: Unremarkable Sinuses/Orbits: Left mastoidectomy with small right mastoid effusion and fluid in the remaining left mastoid air  cells. No middle ear fluid. Other: No supplemental non-categorized findings. IMPRESSION: 1. Resolution of the prior blood products in the right lateral ventricle and third ventricle. Mild reduction in volume of the hyperdense blood products filling most of the left lateral ventricle. Reduction in left-to-right midline shift from previous 8 mm to current 6 mm. 2. Right anterior MCA distribution encephalomalacia compatible with remote stroke. 3. Periventricular white matter and corona radiata hypodensities favor chronic ischemic microvascular white matter disease. 4. Small right mastoid effusion.  Prior left mastoidectomy. Electronically Signed   By: Van Clines M.D.   On: 03/25/2018 19:33   Ct Angio Chest Pe W And/or Wo Contrast  Result Date: 04/15/2018 CLINICAL DATA:  Shortness of breath. EXAM: CT ANGIOGRAPHY CHEST WITH CONTRAST TECHNIQUE: Multidetector CT imaging of the chest was performed using the standard protocol during bolus administration of intravenous contrast. Multiplanar CT image reconstructions and MIPs were obtained to evaluate the vascular anatomy. CONTRAST:  42m ISOVUE-370 IOPAMIDOL (ISOVUE-370) INJECTION 76% COMPARISON:  Chest CT 11/16/2010 Chest radiograph 04/15/2018 FINDINGS: Cardiovascular: --Pulmonary arteries: Contrast injection is sufficient to demonstrate satisfactory opacification of the pulmonary arteries to the segmental level. There is no pulmonary embolus. The main pulmonary artery is within normal limits for size. --Aorta: Limited opacification of the aorta due to bolus timing optimization for the pulmonary arteries. Conventional 3 vessel aortic branching pattern. The aortic course and caliber are normal. There is no aortic atherosclerosis. --Heart: Mild cardiomegaly. No pericardial effusion. Mediastinum/Nodes: No mediastinal, hilar or axillary lymphadenopathy. The visualized thyroid and thoracic esophageal course are unremarkable. Lungs/Pleura: No pulmonary nodules or masses.  No pleural effusion or pneumothorax. No focal airspace consolidation. No focal pleural abnormality. Upper Abdomen: Contrast bolus timing is not optimized for evaluation of the abdominal organs. Within this limitation, the visualized organs of the upper abdomen are normal. Musculoskeletal: No chest wall abnormality. No acute or significant osseous findings. Review of the MIP images confirms the above findings. IMPRESSION: 1. No pulmonary embolus. 2. Mild cardiomegaly. 3. No acute thoracic abnormality. Electronically Signed   By: KUlyses JarredM.D.   On: 04/15/2018 05:01   Mr MJodene NamHead Wo Contrast  Result Date: 03/22/2018 CLINICAL DATA:  Intraventricular hemorrhage EXAM: MRA HEAD WITHOUT CONTRAST TECHNIQUE: Angiographic  images of the Circle of Willis were obtained using MRA technique without intravenous contrast. COMPARISON:  Brain MRI 03/20/2018 FINDINGS: There is unchanged intraventricular blood, predominantly in the left lateral ventricle. The size and configuration of the ventricles are unchanged. ANTERIOR CIRCULATION: --Intracranial internal carotid arteries: Normal. --Anterior cerebral arteries: Normal variant absent left A1 segment. Anterior communicating arteries patent. The A2 segments and beyond are normal. --Middle cerebral arteries: Normal. --Posterior communicating arteries: Present on the left only. POSTERIOR CIRCULATION: --Basilar artery: Normal. --Posterior cerebral arteries: Normal.  Fetal origin on the left. --Superior cerebellar arteries: Normal. --Inferior cerebellar arteries: Normal anterior and posterior inferior cerebellar arteries. IMPRESSION: 1. No aneurysm, large vessel occlusion or high-grade stenosis. 2. Unchanged distribution of intraventricular blood, predominantly located in the left lateral ventricle. Size and configuration of the ventricles are unchanged. Electronically Signed   By: Ulyses Jarred M.D.   On: 03/22/2018 02:27   Ct Head Code Stroke Wo Contrast  Result Date:  04/15/2018 CLINICAL DATA:  Code stroke. Left-sided facial droop and right arm weakness. EXAM: CT HEAD WITHOUT CONTRAST TECHNIQUE: Contiguous axial images were obtained from the base of the skull through the vertex without intravenous contrast. COMPARISON:  03/25/2018 head CT FINDINGS: Brain: There is no acute hemorrhage or extra-axial collection. There is debris within the left lateral ventricle compatible with resolving blood products. There is generalized atrophy without lobar predilection. Old infarct in the right MCA territory with associated encephalomalacia. There is hypoattenuation of the periventricular white matter, most commonly indicating chronic ischemic microangiopathy. Vascular: No abnormal hyperdensity of the major intracranial arteries or dural venous sinuses. No intracranial atherosclerosis. Skull: The visualized skull base, calvarium and extracranial soft tissues are normal. Sinuses/Orbits: Moderate ethmoid and maxillary sinus mucosal thickening. The orbits are normal. ASPECTS [facility] Stroke Program Early CT Score) - Ganglionic level infarction (caudate, lentiform nuclei, internal capsule, insula, M1-M3 cortex): 7 - Supraganglionic infarction (M4-M6 cortex): 3 Total score (0-10 with 10 being normal): 10 IMPRESSION: 1. No acute hemorrhage. 2. Resolving blood products in the left lateral ventricle and encephalomalacia at the site of prior right MCA infarct. 3. ASPECTS is 10. These results were communicated to Dr. Karena Addison Aroor at 12:07 am on 04/15/2018 by text page via the [facility] messaging system. Electronically Signed   By: Ulyses Jarred M.D.   On: 04/15/2018 00:09    Microbiology: Recent Results (from the past 240 hour(s))  Urine Culture     Status: Abnormal   Collection Time: 04/15/18 12:44 AM  Result Value Ref Range Status   Specimen Description URINE, RANDOM  Final   Special Requests NONE  Final   Culture (A)  Final    <10,000 COLONIES/mL INSIGNIFICANT GROWTH Performed at [facility], [address], [place], [place] [address]    Report Status 04/16/2018 FINAL  Final  Blood Culture (routine x 2)     Status: None (Preliminary result)   Collection Time: 04/15/18  2:07 AM  Result Value Ref Range Status   Specimen Description BLOOD LEFT ANTECUBITAL  Final   Special Requests   Final    BOTTLES DRAWN AEROBIC AND ANAEROBIC Blood Culture results may not be optimal due to an inadequate volume of blood received in culture bottles   Culture   Final    NO GROWTH 4 DAYS Performed at [facility], [place] [address], [place], [place] [address]    Report Status PENDING  Incomplete  Blood Culture (routine x 2)     Status: None (Preliminary result)  Collection Time: 04/15/18  2:07 AM  Result Value Ref Range Status   Specimen Description BLOOD LEFT HAND  Final   Special Requests   Final    BOTTLES DRAWN AEROBIC ONLY Blood Culture results may not be optimal due to an inadequate volume of blood received in culture bottles   Culture   Final    NO GROWTH 4 DAYS Performed at [facility] [address] [place],  [address]    Report Status PENDING  Incomplete     Labs: Basic Metabolic Panel: Recent Labs  Lab 04/14/18 2355 04/15/18 0003 04/15/18 0540 04/18/18 0342 04/19/18 0419 04/20/18 0319  NA 136 137 136 139 138 137  K 4.1 4.3 4.3 3.3* 3.6 3.4*  CL 103 106 107 108 106 109  CO2 19*  --  14* _0 GLUCOSE 96 94 117* 94 100* 97  BUN 27* 30* 27* _1 CREATININE 1.52* 1.40* 1.33* 1.23* 1.12* 1.09*  CALCIUM 9.0  --  8.5* 8.7* 8.5* 8.5*  MG  --   --   --   --  1.4* 1.9  PHOS  --   --   --   --  3.0  --    Liver Function Tests: Recent Labs  Lab 04/14/18 2355 04/19/18 0419  AST 13*  --   ALT 11  --   ALKPHOS 80  --   BILITOT 0.9  --   PROT 6.4*  --   ALBUMIN 3.4* 2.7*   No results for input(s): LIPASE, AMYLASE in the last 168 hours. No results for input(s): AMMONIA in the last 168 hours. CBC: Recent Labs  Lab 04/14/18 2355  04/15/18 0003 04/15/18 0540 04/20/18 0319  WBC 7.7  --  6.5 3.5*  NEUTROABS 6.3  --   --   --   HGB 12.8 13.3 12.0 11.6*  HCT 39.8 39.0 37.8 35.2*  MCV 103.6*  --  105.3* 100.6*  PLT 153  --  137* 163   Cardiac Enzymes: No results for input(s): CKTOTAL, CKMB, CKMBINDEX, TROPONINI in the last 168 hours. BNP: BNP (last 3 results) Recent Labs    03/25/18 2016  BNP 608.8*    ProBNP (last 3 results) No results for input(s): PROBNP in the last 8760 hours.  CBG: Recent Labs  Lab 04/14/18 2349  GLUCAP 81

## 2018-04-20 NOTE — Progress Notes (Signed)
Per psych, no capacity to refuse medical treatment, therefore we will presue another alternative to DC which is SNF. Peer to peer in progress. Manson Passey [facility] [phone]

## 2018-04-20 NOTE — Progress Notes (Signed)
[employer] will not cover rehab for patient due to lack of capacity. CSW discussed with patient's son, [name], on the phone. CSW updated that patient determined by psych not to have capacity for decisions. CSW discussed private pay options for SNF and in-home aide. Son indicated they cannot afford to private pay for SNF or home care. Son planning to apply for Medicaid for patient this week. Son indicated patient lives with a roommate who may be able to support with care at home and son can help with some things when not working. Son to follow up with CSW. Referred to [facility] for likely home health needs. CSW to follow.  Abigail Butts, LCSWA [phone]

## 2018-04-20 NOTE — Progress Notes (Signed)
Physical Therapy Treatment Patient Details Name: [name] MRN: [id number] DOB: 1955-11-21 Today's Date: 04/20/2018    History of Present Illness 62 y.o. female admitted with AMS from SNF. Believed to have UTI with possible sepsis. PMH includes but limited to: CVA right MCA, Hypotension, COPD, CAD,  CABG, B Knee surgery. Pt sustained fall at SNF    PT Comments    Very flat affect throughout session, and stating multiple times that, "I'm ready to get out of here." Able to progress mobility today with HR stable throughout session. Standing sink-side to brush teeth with required 1 UE support and min guard. Ambulation in hallway with +1HHA and min guard for safety with fatigue most limiting factor today. Making good progress towards goals.    Follow Up Recommendations  SNF;Supervision/Assistance - 24 hour     Equipment Recommendations  Rolling walker with 5" wheels    Recommendations for Other Services OT consult     Precautions / Restrictions Precautions Precautions: Fall Precaution Comments: tachycardia Restrictions Weight Bearing Restrictions: No    Mobility  Bed Mobility               General bed mobility comments: sitting EOB upon PT arrival  Transfers Overall transfer level: Needs assistance Equipment used: 1 person hand held assist Transfers: Sit to/from Stand Sit to Stand: Min guard         General transfer comment: min guard for safety; slight unsteadiness when rising to feet  Ambulation/Gait Ambulation/Gait assistance: Min guard Gait Distance (Feet): 80 Feet Assistive device: 1 person hand held assist Gait Pattern/deviations: Step-through pattern;Decreased stride length;Drifts right/left;Trunk flexed Gait velocity: decreased   General Gait Details: reaching for handrails in hallway thorughout mobility; slight unsteadiness; HR reamining stable in low 80's   Stairs             Wheelchair Mobility    Modified Rankin (Stroke Patients  Only)       Balance Overall balance assessment: Needs assistance Sitting-balance support: No upper extremity supported;Feet supported Sitting balance-Leahy Scale: Good     Standing balance support: Single extremity supported;During functional activity Standing balance-Leahy Scale: Poor Standing balance comment: reliant on external support                            Cognition Arousal/Alertness: Awake/alert Behavior During Therapy: Flat affect Overall Cognitive Status: No family/caregiver present to determine baseline cognitive functioning                                 General Comments: very falt affect - able to participate in conversation and state prior history without needing redirection      Exercises      General Comments        Pertinent Vitals/Pain Pain Assessment: No/denies pain    Home Living                      Prior Function            PT Goals (current goals can now be found in the care plan section) Acute Rehab PT Goals Patient Stated Goal: "I want to get out of here" PT Goal Formulation: With patient Time For Goal Achievement: 04/23/18 Potential to Achieve Goals: Good Progress towards PT goals: Progressing toward goals    Frequency    Min 2X/week      PT Plan Current  plan remains appropriate    Co-evaluation              AM-PAC PT "6 Clicks" Daily Activity  Outcome Measure  Difficulty turning over in bed (including adjusting bedclothes, sheets and blankets)?: A Little Difficulty moving from lying on back to sitting on the side of the bed? : A Little Difficulty sitting down on and standing up from a chair with arms (e.g., wheelchair, bedside commode, etc,.)?: Unable Help needed moving to and from a bed to chair (including a wheelchair)?: A Little Help needed walking in hospital room?: A Little Help needed climbing 3-5 steps with a railing? : A Little 6 Click Score: 16    End of Session  Equipment Utilized During Treatment: Gait belt Activity Tolerance: Patient tolerated treatment well Patient left: in bed;with call bell/phone within reach Nurse Communication: Mobility status PT Visit Diagnosis: Other abnormalities of gait and mobility (R26.89);Other symptoms and signs involving the nervous system (R29.898);Muscle weakness (generalized) (M62.81)     Time: 7412-8786 PT Time Calculation (min) (ACUTE ONLY): 24 min  Charges:  $Gait Training: 8-22 mins $Therapeutic Activity: 8-22 mins                     Kipp Laurence, PT, DPT 04/20/18 10:45 AM Pager: [phone]

## 2018-04-20 NOTE — Discharge Instructions (Signed)
Diltiazem tablets What is this medicine? DILTIAZEM (dil TYE a zem) is a calcium-channel blocker. It affects the amount of calcium found in your heart and muscle cells. This relaxes your blood vessels, which can reduce the amount of work the heart has to do. This medicine is used to treat chest pain caused by angina. This medicine may be used for other purposes; ask your health care provider or pharmacist if you have questions. COMMON BRAND NAME(S): Cardizem What should I tell my health care provider before I take this medicine? They need to know if you have any of these conditions: -heart problems, low blood pressure, irregular heartbeat -liver disease -previous heart attack -an unusual or allergic reaction to diltiazem, other medicines, foods, dyes, or preservatives -pregnant or trying to get pregnant -breast-feeding How should I use this medicine? Take this medicine by mouth with a glass of water. Follow the directions on the prescription label. Do not cut, crush or chew this medicine. This medicine is usually taken before meals and at bedtime. Take your doses at regular intervals. Do not take your medicine more often then directed. Do not stop taking except on the advice of your doctor or health care professional. Talk to your pediatrician regarding the use of this medicine in children. Special care may be needed. Overdosage: If you think you have taken too much of this medicine contact a poison control center or emergency room at once. NOTE: This medicine is only for you. Do not share this medicine with others. What if I miss a dose? If you miss a dose, take it as soon as you can. If it is almost time for your next dose, take only that dose. Do not take double or extra doses. What may interact with this medicine? Do not take this medicine with any of the following: -cisapride -hawthorn -pimozide -ranolazine -red yeast rice This medicine may also interact with the following  medications: -buspirone -carbamazepine -cimetidine -cyclosporine -digoxin -local anesthetics or general anesthetics -lovastatin -medicines for anxiety or difficulty sleeping like midazolam and triazolam -medicines for high blood pressure or heart problems -quinidine -rifampin, rifabutin, or rifapentine This list may not describe all possible interactions. Give your health care provider a list of all the medicines, herbs, non-prescription drugs, or dietary supplements you use. Also tell them if you smoke, drink alcohol, or use illegal drugs. Some items may interact with your medicine. What should I watch for while using this medicine? Check your blood pressure and pulse rate regularly. Ask your doctor or health care professional what your blood pressure and pulse rate should be and when you should contact him or her. You may feel dizzy or lightheaded. Do not drive, use machinery, or do anything that needs mental alertness until you know how this medicine affects you. To reduce the risk of dizzy or fainting spells, do not sit or stand up quickly, especially if you are an older patient. Alcohol can make you more dizzy or increase flushing and rapid heartbeats. Avoid alcoholic drinks. What side effects may I notice from receiving this medicine? Side effects that you should report to your doctor or health care professional as soon as possible: -allergic reactions like skin rash, itching or hives, swelling of the face, lips, or tongue -confusion, mental depression -feeling faint or lightheaded, falls -pinpoint red spots on the skin -redness, blistering, peeling or loosening of the skin, including inside the mouth -slow, irregular heartbeat -swelling of the ankles, feet -unusual bleeding or bruising Side effects that usually do  not require medical attention (report to your doctor or health care professional if they continue or are bothersome): -change in sex drive or performance -constipation or  diarrhea -flushing of the face -headache -nausea, vomiting -tired or weak -trouble sleeping This list may not describe all possible side effects. Call your doctor for medical advice about side effects. You may report side effects to FDA at 1-800-FDA-1088. Where should I keep my medicine? Keep out of the reach of children. Store at room temperature between 20 and 25 degrees C (68 and 77 degrees F). Protect from light. Keep container tightly closed. Throw away any unused medicine after the expiration date. NOTE: This sheet is a summary. It may not cover all possible information. If you have questions about this medicine, talk to your doctor, pharmacist, or health care provider.  2018 [employer]vier/Gold Standard (2013-08-09 10:54:31) Metoprolol extended-release tablets What is this medicine? METOPROLOL (me TOE proe lole) is a beta-blocker. Beta-blockers reduce the workload on the heart and help it to beat more regularly. This medicine is used to treat high blood pressure and to prevent chest pain. It is also used to after a heart attack and to prevent an additional heart attack from occurring. This medicine may be used for other purposes; ask your health care provider or pharmacist if you have questions. COMMON BRAND NAME(S): toprol, Toprol XL What should I tell my health care provider before I take this medicine? They need to know if you have any of these conditions: -diabetes -heart or vessel disease like slow heart rate, worsening heart failure, heart block, sick sinus syndrome or Raynaud's disease -kidney disease -liver disease -lung or breathing disease, like asthma or emphysema -pheochromocytoma -thyroid disease -an unusual or allergic reaction to metoprolol, other beta-blockers, medicines, foods, dyes, or preservatives -pregnant or trying to get pregnant -breast-feeding How should I use this medicine? Take this medicine by mouth with a glass of water. Follow the directions on the  prescription label. Do not crush or chew. Take this medicine with or immediately after meals. Take your doses at regular intervals. Do not take more medicine than directed. Do not stop taking this medicine suddenly. This could lead to serious heart-related effects. Talk to your pediatrician regarding the use of this medicine in children. While this drug may be prescribed for children as young as 6 years for selected conditions, precautions do apply. Overdosage: If you think you have taken too much of this medicine contact a poison control center or emergency room at once. NOTE: This medicine is only for you. Do not share this medicine with others. What if I miss a dose? If you miss a dose, take it as soon as you can. If it is almost time for your next dose, take only that dose. Do not take double or extra doses. What may interact with this medicine? This medicine may interact with the following medications: -certain medicines for blood pressure, heart disease, irregular heart beat -certain medicines for depression, like monoamine oxidase (MAO) inhibitors, fluoxetine, or paroxetine -clonidine -dobutamine -epinephrine -isoproterenol -reserpine This list may not describe all possible interactions. Give your health care provider a list of all the medicines, herbs, non-prescription drugs, or dietary supplements you use. Also tell them if you smoke, drink alcohol, or use illegal drugs. Some items may interact with your medicine. What should I watch for while using this medicine? Visit your doctor or health care professional for regular check ups. Contact your doctor right away if your symptoms worsen. Check your blood  pressure and pulse rate regularly. Ask your health care professional what your blood pressure and pulse rate should be, and when you should contact them. You may get drowsy or dizzy. Do not drive, use machinery, or do anything that needs mental alertness until you know how this medicine  affects you. Do not sit or stand up quickly, especially if you are an older patient. This reduces the risk of dizzy or fainting spells. Contact your doctor if these symptoms continue. Alcohol may interfere with the effect of this medicine. Avoid alcoholic drinks. What side effects may I notice from receiving this medicine? Side effects that you should report to your doctor or health care professional as soon as possible: -allergic reactions like skin rash, itching or hives -cold or numb hands or feet -depression -difficulty breathing -faint -fever with sore throat -irregular heartbeat, chest pain -rapid weight gain -swollen legs or ankles Side effects that usually do not require medical attention (report to your doctor or health care professional if they continue or are bothersome): -anxiety or nervousness -change in sex drive or performance -dry skin -headache -nightmares or trouble sleeping -short term memory loss -stomach upset or diarrhea -unusually tired This list may not describe all possible side effects. Call your doctor for medical advice about side effects. You may report side effects to FDA at 1-800-FDA-1088. Where should I keep my medicine? Keep out of the reach of children. Store at room temperature between 15 and 30 degrees C (59 and 86 degrees F). Throw away any unused medicine after the expiration date. NOTE: This sheet is a summary. It may not cover all possible information. If you have questions about this medicine, talk to your doctor, pharmacist, or health care provider.  2018 [employer]vier/Gold Standard (2013-04-30 14:41:37)

## 2018-04-20 NOTE — Consult Note (Signed)
[facility] Consult   Reason for Consult:  Capacity evaluation   Referring Physician:  Dr. Charlies Silvers  Patient Identification: [name] MRN:  [id number] Principal Diagnosis: Evaluation by psychiatric service required Diagnosis:   Patient Active Problem List   Diagnosis Date Noted  . GERD (gastroesophageal reflux disease) [K21.9] 04/15/2018  . UTI (urinary tract infection) [N39.0] 04/15/2018  . Atrial fibrillation, chronic ([place]) [I48.2] 04/15/2018  . Depression [F32.9] 04/15/2018  . Fall [W19.XXXA] 04/15/2018  . Tobacco abuse [Z72.0] 04/15/2018  . Sepsis ([place]) [A41.9] 04/15/2018  . Acute metabolic encephalopathy [L27.51] 04/15/2018  . Hypertensive emergency [I16.1] 03/25/2018  . NSTEMI (non-ST elevated myocardial infarction) ([place]) [I21.4]   . Troponin level elevated [R74.8]   . IVH (intraventricular hemorrhage) ([place]) [I61.5] 03/19/2018  . Stroke (cerebrum) ([place]) [I63.9] 03/18/2018  . Long term current use of amiodarone [Z79.899] 03/27/2017  . Severe recurrent major depression without psychotic features ([place]) [F33.2] 11/19/2016    Class: Chronic  . Pre-procedure lab exam [Z00.174] 09/13/2016  . Generalized headaches [R51] 07/04/2016  . Protein-calorie malnutrition, severe ([place]) [E43] 04/03/2015  . Chronic diastolic CHF (congestive heart failure), NYHA class 2 ([place]) [I50.32] 01/24/2015  . Demand ischemia secondary to AF with RVR [I24.8] 12/13/2014  . CKD (chronic kidney disease), stage III ([place]) [N18.3] 11/11/2014  . Hypokalemia [E87.6] 05/22/2014  . Mood disorder ([place]) [F39] 03/20/2014  . HTN (hypertension) [I10] 03/20/2014  . Anemia, unspecified [D64.9] 03/20/2014  . Smoker [F17.200] 03/20/2014  . Atrial fibrillation with RVR ([place]) [I48.91] 03/03/2014  . AKI (acute kidney injury) ([place]) [N17.9] 03/03/2014  . Hypotension [I95.9] 03/03/2014  . Pulmonary hypertension ([place]) [I27.20] 03/03/2014  . COPD (chronic obstructive pulmonary disease) ([place]) [J44.9] 03/03/2014   . H/O: CVA (cerebrovascular accident) [Z86.73]   . Long-term (current) use of anticoagulants [Z79.01]   . 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 Hyperlipidemia [E78.5] 02/18/2014  . CAD '07, LAD PCI 2012, SVG-PDA PTCA 11/10/14 [I25.10] 02/16/2014  . Chronic diastolic heart failure ([place]) [I50.32]   . Pulmonary nodules [R91.8]   . Hypothyroidism [E03.9]   . OSA- C-pap intol [G47.33]     Total Time spent with patient: 1 hour  Subjective:   [name] is a 62 y.o. female patient admitted with AMS.  HPI:   Per chart review, patient was admitted with fall and AMS. She was diagnosed with sepsis secondary to UTI. She has been refusing her medications and reports that she does not have a history of stroke. She has refused Diltiazem, Clonidine and Metoprolol today. Psychiatry was consulted for capacity evaluation to refuse care. She has a history of depression and last followed up with Dr. Casimiro Needle in 10/2017. She was doing well at this time and was continued on Lexapro 10 mg daily and Wellbturin 300 mg daily (reduced to 150 mg daily since last visit).    On interview, [name] reports, "It wasn't by choice that I came to the hospital" when asked why she is admitted to the hospital.  She later reports that she is here for atrial fibrillation.  She reports that her heart has been racing.  She is asked about her home medications and reports, "Whatever they give me."  She reports that her outpatient doctor took her off all her medications because she was "overmedicated."  She is unable to remember her doctor's name or the medications that she has been taking.  She is informed that the medications that she has refused are warranted to treat  her medical conditions but she continues to decline taking the medications.  She reports that her doctor told her that she no longer needed the medications so she needs to speak to her doctor first.  She is unwilling to  start the medications if the team contacts her doctor while she is hospitalized to verify this information.  She reports, "It does not make sense that I was taken off of my medications because they were not needed and now other doctors are telling me that they are needed."  She is able to list the risks of refusing medications including stroke with poorly managed hypertension although she does not appear to appreciate her medical condition.  She reports compliance with her psychotropic medications.  She reports her mood is "so-so."  She does report hypersomnia with sleeping up to 14 hours daily and poor energy.  She reports weight gain of 20 pounds over the past couple months.  She has a history of obstructive sleep apnea that spontaneously resolved after she lost 200 pounds in the past.  She reports a history of stroke in 2012.  She is asked about the decrease in her Wellbutrin dose although she does not remember when it was decreased after her last visit with her outpatient provider.  She declines to increase this medication to help with depressive symptoms.  She reports that she will follow up with her outpatient doctor to discuss her medications.  She denies SI, HI or AVH.  She denies a history of suicide attempt although she reports intermittent SI in the past.  She is able to safety plan.  Past Psychiatric History: MDD  Risk to Self:  None. Denies SI.  Risk to Others:  None. Denies HI.  Prior Inpatient Therapy:  Denies  Prior Outpatient Therapy:  She is followed by Dr. Casimiro Needle.   Past Medical History:  Past Medical History:  Diagnosis Date  . Acute right MCA stroke ([place]) 11/07/10  . Atrial fibrillation ([place])    a. s/p TEE-DCCV 02/2104; b. Xarelto started  . CAD (coronary artery disease)    a.  cath 09/2010: LAD stent patent, S-Int/dCFX ok, S-PDA ok, L-LAD atretic;  b. Lexiscan Myoview (02/2014):  no ischemia, EF 55%; c. 11/2014 Cath/PCI: LM nl, LAD 20pISR, LCX 80-16m OM1 nl, RI 70p, RCA 40-5105m RPDA 95ost/95-9535mTCA only w/ reduction to 50p/79m67m0d, L->LAD atretic, VG->RI->OM nl, VG->PDA 100p.  . Cardiomyopathy with EF 40% at TEE 02/17/14 (likely tachycardia mediated - Myoview 02/19/14 neg for ischemia with normal EF) 02/18/2014  . COPD (chronic obstructive pulmonary disease) (HCC)[place]. Depression   . Eczema   . GERD (gastroesophageal reflux disease)   . Headache   . HLD (hyperlipidemia)   . Homelessness 11/12/2011  . HPV test positive    with Ascus on pap 2015, followed by Dr HollMatthew SarasHx MRSA infection    Chest wall syndrome post CABG  . Hx of CABG   . Hx of transesophageal echocardiography (TEE) for monitoring 11/2010   TEE 11/2010: EF 60-65%, BAE, trivial atrial septal shunt;  right heart cath in 10/2010 with elevated R and L heart pressures and diuretic started  . Hypertension   . Hypothyroidism   . Persistent atrial fibrillation (HCC)[place]/2016  . Pulmonary nodules    repeat CT due in 11/2011  . Sleep apnea    recent sleep study  in 04/2014 per chart review  shows no significant OSA    Past Surgical History:  Procedure Laterality  Date  . bilateral knee surgery    . BLADDER SURGERY    . CARDIOVERSION N/A 02/17/2014   Procedure: CARDIOVERSION;  Surgeon: Fay Records, MD;  Location: [facility] ENDOSCOPY;  Service: Cardiovascular;  Laterality: N/A;  . CARDIOVERSION N/A 09/20/2016   Procedure: CARDIOVERSION;  Surgeon: Dorothy Spark, MD;  Location: [facility];  Service: Cardiovascular;  Laterality: N/A;  . cath 2012    . CHEST WALL RECONSTRUCTION    . CORONARY ARTERY BYPASS GRAFT    . debriment for infection in chest    . HERNIA REPAIR    . LEFT AND RIGHT HEART CATHETERIZATION WITH CORONARY ANGIOGRAM N/A 11/10/2014   Procedure: LEFT AND RIGHT HEART CATHETERIZATION WITH CORONARY ANGIOGRAM;  Surgeon: Leonie Man, MD;  Location: [facility] CATH LAB;  Service: Cardiovascular;  Laterality: N/A;  . Left mastoidectomy    . TEE WITHOUT CARDIOVERSION N/A 02/17/2014   Procedure: TRANSESOPHAGEAL  ECHOCARDIOGRAM (TEE);  Surgeon: Fay Records, MD;  Location: [facility] ENDOSCOPY;  Service: Cardiovascular;  Laterality: N/A;   Family History:  Family History  Problem Relation Age of Onset  . COPD Mother   . Heart disease Mother   . Arthritis Mother        Rheumatoid and PMR  . Osteoporosis Mother        Mom fractured hip  . Diabetes type II Mother   . Depression Mother   . Heart attack Father   . Depression Father   . Hypertension Father   . Alcohol abuse Father   . Depression Sister   . Anxiety disorder Sister   . Drug abuse Sister   . Stroke Neg Hx    Family Psychiatric  History: Father-depression.  Social History:  Social History   Substance and Sexual Activity  Alcohol Use No  . Alcohol/week: 0.0 standard drinks     Social History   Substance and Sexual Activity  Drug Use No    Social History   Socioeconomic History  . Marital status: Divorced    Spouse name: Not on file  . Number of children: 2  . Years of education: Not on file  . Highest education level: Not on file  Occupational History  . Occupation: DISABLE   Social Needs  . Financial resource strain: Not on file  . Food insecurity:    Worry: Not on file    Inability: Not on file  . Transportation needs:    Medical: Not on file    Non-medical: Not on file  Tobacco Use  . Smoking status: Current Every Day Smoker    Packs/day: 0.50    Years: 30.00    Pack years: 15.00    Types: Cigarettes    Last attempt to quit: 09/10/2014    Years since quitting: 3.6  . Smokeless tobacco: Never Used  Substance and Sexual Activity  . Alcohol use: No    Alcohol/week: 0.0 standard drinks  . Drug use: No  . Sexual activity: Not Currently  Lifestyle  . Physical activity:    Days per week: Not on file    Minutes per session: Not on file  . Stress: Not on file  Relationships  . Social connections:    Talks on phone: Not on file    Gets together: Not on file    Attends religious service: Not on file    Active  member of club or organization: Not on file    Attends meetings of clubs or organizations: Not on file  Relationship status: Not on file  Other Topics Concern  . Not on file  Social History Narrative   Divorced   After stroke was living with sister and mother after sister asked her to leave. Mom has since deceased    Difficulties over control.    Then moved back in for about 6 weeks.   . tranportaion difficult son  helping   Ex-smoker   Was living at friends  Sister and her had argument and she was told to leave .    She was living in a homeless shelter for the last 3 weeks [facility]    son had help with transportation and medication       Work status regular  before got sick on short-term disability now lost t insurance after the stroke and couldn't work was  denied ssi    2 times.  She is not eligible for Medicaid because she doesn't have dependent children.         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   Lives in  rented house 2 house mates males keep tp self has her own b room  Doing well    Additional Social History: She lives in a house with 2 roommates. She receives SSI. She denies alcohol or illicit substance use.     Allergies:   Allergies  Allergen Reactions  . Avelox [Moxifloxacin Hcl In Nacl] Other (See Comments)    Mental breakdown.  Hosie Spangle [Nortriptyline Hcl] Other (See Comments)    Made her want to hurt herself  . Amoxicillin Hives  . Hydrocodone Itching  . Oxycodone Itching  . Penicillins Hives       . Sulfa Antibiotics Other (See Comments)    unknown    Labs:  Results for orders placed or performed during the hospital encounter of 04/14/18 (from the past 48 hour(s))  Renal function panel     Status: Abnormal   Collection Time:  04/19/18  4:19 AM  Result Value Ref Range   Sodium 138 135 - 145 mmol/L   Potassium 3.6 3.5 - 5.1 mmol/L   Chloride 106 98 - 111 mmol/L   CO2 23 22 - 32 mmol/L   Glucose, Bld 100 (H) 70 - 99 mg/dL   BUN 10 6 - 20 mg/dL   Creatinine, Ser 1.12 (H) 0.44 - 1.00 mg/dL   Calcium 8.5 (L) 8.9 - 10.3 mg/dL   Phosphorus 3.0 2.5 - 4.6 mg/dL   Albumin 2.7 (L) 3.5 - 5.0 g/dL   GFR calc non Af Amer 52 (L) >60 mL/min   GFR calc Af Amer >60 >60 mL/min    Comment: (NOTE) The eGFR has been calculated using the CKD EPI equation. This calculation has not been validated in all clinical situations. eGFR's persistently <60 mL/min signify possible Chronic Kidney Disease.    Anion gap 9 5 - 15    Comment: Performed at [facility] [address], [place], [place] [address]  Magnesium     Status: Abnormal   Collection Time: 04/19/18  4:19 AM  Result Value Ref Range   Magnesium 1.4 (L) 1.7 - 2.4 mg/dL    Comment: Performed at [facility] [address] [place], [place] [address]  CBC     Status: Abnormal   Collection Time: 04/20/18  3:19 AM  Result Value Ref Range   WBC 3.5 (L) 4.0 - 10.5 K/uL   RBC 3.50 (L) 3.87 - 5.11 MIL/uL   Hemoglobin 11.6 (L) 12.0 - 15.0 g/dL   HCT 35.2 (L) 36.0 - 46.0 %   MCV 100.6 (H) 78.0 - 100.0 fL   MCH 33.1 26.0 - 34.0 pg   MCHC 33.0 30.0 - 36.0 g/dL   RDW 11.9 11.5 - 15.5 %   Platelets 163 150 - 400 K/uL    Comment: Performed at [facility], [place] [address] [place], [place] [address]  Magnesium     Status: None   Collection Time: 04/20/18  3:19 AM  Result Value Ref Range   Magnesium 1.9 1.7 - 2.4 mg/dL    Comment: Performed at [facility] [address], [place], [place] [address]  Basic metabolic panel     Status: Abnormal   Collection Time: 04/20/18  3:19 AM  Result Value Ref Range   Sodium 137 135 - 145 mmol/L   Potassium 3.4 (L) 3.5 - 5.1 mmol/L   Chloride 109 98 - 111 mmol/L   CO2 22 22 - 32 mmol/L   Glucose, Bld 97 70 - 99  mg/dL   BUN 10 6 - 20 mg/dL   Creatinine, Ser 1.09 (H) 0.44 - 1.00 mg/dL   Calcium 8.5 (L) 8.9 - 10.3 mg/dL   GFR calc non Af Amer 54 (L) >60 mL/min   GFR calc Af Amer >60 >60 mL/min    Comment: (NOTE) The eGFR has been calculated using the CKD EPI equation. This calculation has not been validated in all clinical situations. eGFR's persistently <60 mL/min signify possible Chronic Kidney Disease.    Anion gap 6 5 - 15    Comment: Performed at [facility] [address], [place], [place] [address]    Current Facility-Administered Medications  Medication Dose Route Frequency Provider Last Rate Last Dose  . 0.9 %  sodium chloride infusion   Intravenous Continuous Dana Allan I, MD 50 mL/hr at 04/16/18 1705    . acetaminophen (TYLENOL) tablet 650 mg  650 mg Oral Q6H PRN Ivor Costa, MD   650 mg at 04/19/18 1802  . albuterol (PROVENTIL) (2.5 MG/3ML) 0.083% nebulizer solution 2.5 mg  2.5 mg Nebulization Q4H PRN Ivor Costa, MD      . aspirin EC tablet 81 mg  81 mg Oral Daily Ivor Costa, MD   81 mg at 04/19/18 0845  . atorvastatin (LIPITOR) tablet 40 mg  40 mg Oral q1800 Ivor Costa, MD   40 mg at 04/19/18 1657  . buPROPion (WELLBUTRIN XL) 24 hr tablet 150 mg  150 mg Oral q morning - 10a Ivor Costa, MD   150 mg at 04/20/18 1037  . cloNIDine (CATAPRES) tablet 0.1 mg  0.1 mg Oral Daily Robbie Lis, MD      . diltiazem (CARDIZEM) tablet 60 mg  60 mg Oral Q6H Dana Allan I, MD   60 mg at 04/19/18 2142  . escitalopram (LEXAPRO) tablet 10 mg  10 mg Oral Daily Ivor Costa, MD   10 mg at 04/20/18 1036  . famotidine (PEPCID) tablet 20 mg  20 mg Oral BID Ivor Costa, MD   20 mg at 04/20/18 1036  . fluticasone (FLONASE) 50 MCG/ACT nasal spray 2 spray  2 spray Each Nare Daily Ivor Costa, MD   2  spray at 04/20/18 1037  . hydrALAZINE (APRESOLINE) injection 5 mg  5 mg Intravenous Q2H PRN Ivor Costa, MD      . levothyroxine (SYNTHROID, LEVOTHROID) tablet 150 mcg  150 mcg Oral QAC breakfast  Dana Allan I, MD   150 mcg at 04/20/18 0748  . magnesium oxide (MAG-OX) tablet 400 mg  400 mg Oral Daily Ivor Costa, MD   400 mg at 04/20/18 1036  . metoprolol tartrate (LOPRESSOR) tablet 25 mg  25 mg Oral BID Dana Allan I, MD   25 mg at 04/18/18 2209  . nicotine (NICODERM CQ - dosed in mg/24 hours) patch 21 mg  21 mg Transdermal Daily Ivor Costa, MD   21 mg at 04/16/18 0826  . ondansetron (ZOFRAN) tablet 4 mg  4 mg Oral Q6H PRN Ivor Costa, MD       Or  . ondansetron [facility]) injection 4 mg  4 mg Intravenous Q6H PRN Ivor Costa, MD      . topiramate (TOPAMAX) tablet 50 mg  50 mg Oral BID Ivor Costa, MD   50 mg at 04/20/18 1036  . vitamin B-12 (CYANOCOBALAMIN) tablet 1,000 mcg  1,000 mcg Oral Daily Ivor Costa, MD   1,000 mcg at 04/20/18 1036  . Vitamin D (Ergocalciferol) (DRISDOL) capsule 50,000 Units  50,000 Units Oral Q7 days Ivor Costa, MD      . zolpidem (AMBIEN) tablet 5 mg  5 mg Oral QHS PRN Ivor Costa, MD        Musculoskeletal: Strength & Muscle Tone: decreased due to physical deconditioning.  Gait & Station: unable to stand Patient leans: N/A  Psychiatric Specialty Exam: Physical Exam  Nursing note and vitals reviewed. Constitutional: She is oriented to person, place, and time. She appears well-developed and well-nourished.  HENT:  Head: Normocephalic and atraumatic.  Neck: Normal range of motion.  Respiratory: Effort normal.  Musculoskeletal: Normal range of motion.  Neurological: She is alert and oriented to person, place, and time.  Skin: No rash noted.  Psychiatric: Her speech is normal and behavior is normal. Judgment and thought content normal. Cognition and memory are impaired. She exhibits a depressed mood.    Review of Systems  Constitutional: Negative for chills and fever.  Cardiovascular: Negative for chest pain.  Gastrointestinal: Negative for abdominal pain, constipation, diarrhea, nausea and vomiting.  Musculoskeletal: Positive for joint pain.   Psychiatric/Behavioral: Negative for depression, hallucinations, substance abuse and suicidal ideas. The patient does not have insomnia.   All other systems reviewed and are negative.   Blood pressure 100/74, pulse 86, temperature 98.3 F (36.8 C), temperature source Oral, resp. rate 16, height '5\' 4"'  (1.626 m), weight 68.9 kg, SpO2 100 %.Body mass index is 26.06 kg/m.  General Appearance: Fairly Groomed, middle aged, Caucasian female who appears older than her stated age, wearing a hospital gown and lying on her left side in bed. NAD.   Eye Contact:  Fair  Speech:  Clear and Coherent and Normal Rate  Volume:  Normal  Mood:  "So so"  Affect:  Depressed  Thought Process:  Goal Directed, Linear and Descriptions of Associations: Intact  Orientation:  Full (Time, Place, and Person)  Thought Content:  Illogical  Suicidal Thoughts:  No  Homicidal Thoughts:  No  Memory:  Immediate;   Fair Recent;   Fair Remote;   Poor  Judgement:  Poor  Insight:  Poor  Psychomotor Activity:  Decreased  Concentration:  Concentration: Good and Attention Span: Good  Recall:  Fair  Fund of Knowledge:  Fair  Language:  Good  Akathisia:  No  Handed:  Right  AIMS (if indicated):   N/A  Assets:  Agricultural consultant Housing Social Support  ADL's:  Impaired  Cognition: Impaired with poor insight and judgment about her medical condition.   Sleep:   She endorses hypersomnia.    Assessment:  [name] is a 62 y.o. female who was admitted with AMS and found to have sepsis due to UTI. She refuses her current medications although she is unable to rationally manipulate information related to her medical condition and is unable to appreciate her medical condition which is likely secondary to cognitive impairment and poor medical literacy. She will need an authorized representative to help her make decisions regarding her current treatment.   Treatment Plan Summary: -Patient lacks  capacity to refuse current treatment.  -Patient reports depressive symptoms. Recommended increasing Wellbutrin 150 mg daily to 300 mg daily but she declines at this time and would like to follow up with her outpatient provider.  -Continue Lexapro 10 mg daily for depression. -Patient should follow up with her outpatient provider for medication management.  -Psychiatry will sign off on patient at this time. Please consult psychiatry again as needed.   Disposition: No evidence of imminent risk to self or others at present.   Patient does not meet criteria for psychiatric inpatient admission.  Faythe Dingwall, DO 04/20/2018 12:08 PM

## 2018-04-20 NOTE — Progress Notes (Signed)
MD on floor rounding on patient. MD notified that pt continues to refuse telemetry and refused am Cardizem dose. MD gave verbal order to DC telemetry. Dierdre Highman, RN

## 2018-04-20 NOTE — Progress Notes (Signed)
At 0415 pt agreed to am VS & weight, she also agreed to let staff replace telemetry monitor. However pt refused PO dose of Cardizem. HR currently 60's to 70's at rest. Notified by CCMD pt had a 2.27 slow ventricular response at 0450. Will continue to monitor. Dierdre Highman, RN

## 2018-04-20 NOTE — Progress Notes (Addendum)
This am when giving 1000 meds pt stated some medications were wrong and she was not taking them. Pt took some of her scheduled medications and refused other medication. This RN asked why pt thought medication was wrong and communicated with pt she had stroke in July 2019, d/c to [facility] for Rehab, and prescribed new medications during hospitalization in July.  Pt communicated to this RN  she did not have a stroke and those medications were incorrect. She stated she woke up one day in [facility] and was being held against her will. Pt did not recall July 2019 admission to hospital and diagnosis of Stroke at that time.  This RN contacted pt's PCP's nurse who stated pt was seen in office last year and was pleasant, A&Ox4, and was compliant with medical management.  Pt's PCP-Panosh would like update prior to d/c from hospital. [phone].  This RN updated MD. Nursing will cont to monitor.

## 2018-04-21 ENCOUNTER — Ambulatory Visit: Payer: Self-pay | Admitting: Neurology

## 2018-04-21 NOTE — Care Management Note (Addendum)
Case Management Note  Patient Details  Name: [name] MRN: [id number] Date of Birth: [birth year]  Subjective/Objective: Pt presented after a fall resulting in AMS. Positive Hemorrhagic Stroke-plan was to transition to SNF on 04-20-18. Pt does not remember that she has had a stroke. Psych was then consulted and patient does not have capacity to make decisions regarding treatment. PTA from [facility]- unable to return at this time 2/2 cost-family would need to pay $18,000 dollars upfront to return. Son states he is not able to pay this amount. He works during the day and will not be able to provide care in the home. Before [facility] patient was renting a room and she had two roommates. Son placed a call to one of the roommates that could possibly consent to watching her during the day. Son awaiting phone call back. CM did ask son if patient owns her own home and he states just renting a room. Son states he will go to DSS to make sure Medicaid Application is filled out on Wednesday.   Action/Plan: CM did discuss the case in the Quality Collaborative Meeting Tuesday in regards to disposition and patient not having capacity.  Chiropodist can possibly do a LOG for 30 days to SNF if patient does not have any assets and can be approved for Medicaid. CSW was asked to see if she could screen the patient to see if any assets available. CM did reach out to [place] with [facility] to see if [facility] can assist with disposition needs as well. Adult daycare and [facility] discussed in regards to alternative agencies that may be able to assist. CM did reach out to the [facility] and enrollment is the first of the month. CM will provide son with PACE information and the Adult Day Care as alternative. CM will continue to monitor for additional needs.  Expected Discharge Date:            Expected Discharge Plan:  Skilled Nursing Facility  In-House Referral:  Clinical Social Work  Discharge planning  Services  CM Consult  Post Acute Care Choice:    Choice offered to:     DME Arranged:    DME Agency:     HH Arranged:    HH Agency:     Status of Service:  In process, will continue to follow  If discussed at Long Length of Stay Meetings, dates discussed:    Additional Comments: 1116 04-27-18 Tomi Bamberger, RN,BSN [phone] Received PASRR for SNF placement at [facility]. CSW working with Chiropodist in regards to logistics of plan of care. [facility] will follow post hospitalization and post SNF to follow in the community. No further needs from CM at this time.    1422 04-23-18 Tomi Bamberger, RN,BSN [phone] CSW and CM spoke with Luci Bank Director Transitions of Care in regards to 30 day LOG- has been approved. Awaiting PASRR for SNF placement. CM had recently spoken with [facility] and they will follow post SNF for possible Personal Care Services in the home. CM had discussed with [facility] Liaison [place] in regards to [facility] and this being ideal for the patient once her Medicaid is approved. Son is aware of the community resources. CSW will continue to seek out LOG SNF bed. CM will continue to monitor for additional needs.   Gala Lewandowsky, RN 04/21/2018, 2:02 PM

## 2018-04-21 NOTE — Progress Notes (Signed)
Pt seen and examined at bedside this am. Stable for discharge pending findings the SNF.  Refer to DC summary completed 04/20/2018. No changes in medical management since 04/20/2018.   Manson Passey [facility] [phone]

## 2018-04-21 NOTE — Consult Note (Signed)
   [facility] CM Inpatient Consult   04/21/2018  [name] [birth year] [id number]   Spoke with inpatient RNCM, Steward Drone regarding [facility] support for post hospital follow up needs as patient has been denied SNF.  Patient continue to have capacity issues for living without 24 hour support.  Will continue to follow what the son applies for Medicaid.  Also, inpatient CSW assessment.  Reached out to HomeCare Providers and will follow up for transitional plans.  The concerns are for

## 2018-04-22 ENCOUNTER — Ambulatory Visit: Payer: Self-pay | Admitting: Neurology

## 2018-04-22 MED ORDER — SODIUM CHLORIDE 0.9 % IV BOLUS
250.0000 mL | Freq: Once | INTRAVENOUS | Status: AC
  Administered 2018-04-22: 250 mL via INTRAVENOUS

## 2018-04-22 NOTE — Progress Notes (Signed)
Patient hypotensive after am meds.  76/62  HR 47  Assymptomatic.  Improved with 250cc NS bolus.  Recommended BR until BP improved.  RN to call if assistance needed  11am  96/73  Hr  48 12 noon  109/80  HR 60 1300  110/72  HR  50 1400  92/77  HR 67 1800  115/91  HR 79

## 2018-04-22 NOTE — Progress Notes (Signed)
Pt continues to have irregular HR with frequent episodes of bradycardia.  Compliant with all meds this am.  Hypotensive and MD notified.  250 NS bolus given and maintenance  fluids restarted.  Cardiac monitoring ordered and initiated per protocol. Rapid response nurse called for additional assessment and evaluation.  Asymptomatic at this time.  Will continue to closely monitor.

## 2018-04-22 NOTE — Progress Notes (Signed)
PROGRESS NOTE    [name]  IWP:[id number] DOB: 1955/12/01 DOA: 04/14/2018  PCP: Burnis Medin, MD   Brief Narrative:  62 year old female with past medical history significant forrecent hemorrhagic stroke with left-sided weakness, hypertension, hyperlipidemia, COPD, atrial fibrillation not on anticoagulants, CAD, CABG, CHF, OSA, tobacco abuse, GERD, hypothyroidism, depression, who presented with a fall and altered mental status.  Hospital course complicated with A fib with RVR and HR In 140-170's range.  Assessment & Plan:   Sepsis secondary to UTI - Sepsis criteria met on admission with hypotension, tachypnea and evidence of UTI based on urinalysis (moderate leukocytes and presence of WBC and bacteria) - WBC count on 8/7 was WNL - Pt was on on aztreonem through 04/16/18 - Urine cx and blood cx showed no growth  - Lactic acid 1.92,repeat lactic acid on 8/10 was WNL - Sepsis etiology resolved   Hypotension / Essential hypertension  - Continue metoprolol and Cardizem for HR control - Hold off on clonidine and lisinopril due to risk of hypotension.  Acute metabolic encephalopathy - Due to sepsis, UTI - CT head with improving hhg CVA since prior study - Mental status better. Evaluated by psych - pt does not have capacity for medical treatment decisions, needs to be placed in SNF since she requires meds for a fib control and she is at high risk of being non-compliant if she is discharged home   Hypothyroidism - Continue synthroid160mg daily  - Follow up TSH in outpatient setting  Chronic diastolic heart failure ([place] - 2D echo on 03/26/2018 showed EF 50% - CHF compensated   CAD '07, LAD PCI 2012, SVG-PDA PTCA 11/10/14 - S/P of CABG - Continue Lipitor and Aspirin   Hyperlipidemia: - Continue Atorvastatin 40 mg daily   AK[place] (acute kidney injury) ([place] - Prerenal, due to sepsis - Cr improved with hydration   COPD (chronic obstructive pulmonary disease) ([facility] -  Use as needed albuterol nebulizer - Stable resp status   StrokeandIVH (intraventricular hemorrhage) (HCC) - CT-head showed improvement of hemorrhage and midline shift - Continue aspirin, Lipitor  GERD without esophagitis - Continue pepcid   Paroxysmal atrial Fibrillation - CHA2DS2-VASc Score 6 - Not on AC due to recent hhg stroke - Continue aspirin - Continue Cardizem and metoprolol   Depression - Continue Lexapro 10 gm daily  - Continue Wellbutrin150 mg daily  Fall - CT head without acute findings  - Per PT, SNF recommended, awaiting placement   Tobacco abuse - Counseled on smoking cessation - Has nicotine patch ordered   Diet: heart healthy   DVT prophylaxis:SCD's Code Status:full code  Family Communication:no family at bedside     Consultants:  Cardiology  Neurology, Dr. ALorraine Lax Procedures:  None   Antimicrobials:   Aztreonam through 04/16/18   Subjective: No overnight events.  Objective: Vitals:   04/22/18 0325 04/22/18 0331 04/22/18 0600 04/22/18 0900  BP:  (!) 100/59 121/67 95/66  Pulse:  (!) 53 61   Resp:      Temp:  97.6 F (36.4 C)    TempSrc:  Oral    SpO2:  99%    Weight: 68.3 kg     Height:        Intake/Output Summary (Last 24 hours) at 04/22/2018 0907 Last data filed at 04/22/2018 05053Gross per 24 hour  Intake 700 ml  Output 300 ml  Net 400 ml   Filed Weights   04/20/18 0601 04/21/18 0400 04/22/18 0325  Weight: 68.9 kg 68.6 kg  68.3 kg    Examination:  General exam: Appears calm and comfortable  Respiratory system: Clear to auscultation. Respiratory effort normal. Cardiovascular system: S1 & S2 heard, RRR. No JVD, murmurs, rubs, gallops or clicks. No pedal edema. Gastrointestinal system: Abdomen is nondistended, soft and nontender. No organomegaly or masses felt. Normal bowel sounds heard. Central nervous system: Alert and oriented. No focal neurological deficits. Extremities: Symmetric 5 x 5  power. Skin: No rashes, lesions or ulcers Psychiatry: Judgement and insight appear normal. Mood & affect appropriate.   Data Reviewed: I have personally reviewed following labs and imaging studies  CBC: Recent Labs  Lab 04/20/18 0319  WBC 3.5*  HGB 11.6*  HCT 35.2*  MCV 100.6*  PLT 881   Basic Metabolic Panel: Recent Labs  Lab 04/18/18 0342 04/19/18 0419 04/20/18 0319  NA 139 138 137  K 3.3* 3.6 3.4*  CL 108 106 109  CO2 '25 23 22  ' GLUCOSE 94 100* 97  BUN '12 10 10  ' CREATININE 1.23* 1.12* 1.09*  CALCIUM 8.7* 8.5* 8.5*  MG  --  1.4* 1.9  PHOS  --  3.0  --    GFR: Estimated Creatinine Clearance: 52.1 mL/min (A) (by C-G formula based on SCr of 1.09 mg/dL (H)). Liver Function Tests: Recent Labs  Lab 04/19/18 0419  ALBUMIN 2.7*   No results for input(s): LIPASE, AMYLASE in the last 168 hours. No results for input(s): AMMONIA in the last 168 hours. Coagulation Profile: No results for input(s): INR, PROTIME in the last 168 hours. Cardiac Enzymes: No results for input(s): CKTOTAL, CKMB, CKMBINDEX, TROPONINI in the last 168 hours. BNP (last 3 results) No results for input(s): PROBNP in the last 8760 hours. HbA1C: No results for input(s): HGBA1C in the last 72 hours. CBG: No results for input(s): GLUCAP in the last 168 hours. Lipid Profile: No results for input(s): CHOL, HDL, LDLCALC, TRIG, CHOLHDL, LDLDIRECT in the last 72 hours. Thyroid Function Tests: No results for input(s): TSH, T4TOTAL, FREET4, T3FREE, THYROIDAB in the last 72 hours. Anemia Panel: No results for input(s): VITAMINB12, FOLATE, FERRITIN, TIBC, IRON, RETICCTPCT in the last 72 hours. Urine analysis:    Component Value Date/Time   COLORURINE AMBER (A) 04/15/2018 0046   APPEARANCEUR HAZY (A) 04/15/2018 0046   LABSPEC 1.021 04/15/2018 0046   PHURINE 5.0 04/15/2018 0046   GLUCOSEU NEGATIVE 04/15/2018 0046   HGBUR SMALL (A) 04/15/2018 0046   BILIRUBINUR NEGATIVE 04/15/2018 0046   BILIRUBINUR neg  05/11/2014 2056   KETONESUR 5 (A) 04/15/2018 0046   PROTEINUR NEGATIVE 04/15/2018 0046   UROBILINOGEN 1.0 04/01/2015 1530   NITRITE NEGATIVE 04/15/2018 0046   LEUKOCYTESUR MODERATE (A) 04/15/2018 0046   Sepsis Labs: '@LABRCNTIP' (procalcitonin:4,lacticidven:4)   Recent Results (from the past 240 hour(s))  Urine Culture     Status: Abnormal   Collection Time: 04/15/18 12:44 AM  Result Value Ref Range Status   Specimen Description URINE, RANDOM  Final   Special Requests NONE  Final   Culture (A)  Final    <10,000 COLONIES/mL INSIGNIFICANT GROWTH Performed at [facility], [address] [place], [place] [address]    Report Status 04/16/2018 FINAL  Final  Blood Culture (routine x 2)     Status: None   Collection Time: 04/15/18  2:07 AM  Result Value Ref Range Status   Specimen Description BLOOD LEFT ANTECUBITAL  Final   Special Requests   Final    BOTTLES DRAWN AEROBIC AND ANAEROBIC Blood Culture results may not be optimal  due to an inadequate volume of blood received in culture bottles   Culture   Final    NO GROWTH 5 DAYS Performed at [facility], [place] [address], [place], [place] [address]    Report Status 04/20/2018 FINAL  Final  Blood Culture (routine x 2)     Status: None   Collection Time: 04/15/18  2:07 AM  Result Value Ref Range Status   Specimen Description BLOOD LEFT HAND  Final   Special Requests   Final    BOTTLES DRAWN AEROBIC ONLY Blood Culture results may not be optimal due to an inadequate volume of blood received in culture bottles   Culture   Final    NO GROWTH 5 DAYS Performed at [facility], [place] [address], [place], [place] [address]    Report Status 04/20/2018 FINAL  Final      Radiology Studies: No results found.      Scheduled Meds: . aspirin EC  81 mg Oral Daily  . atorvastatin  40 mg Oral q1800  . buPROPion  150 mg Oral q morning - 10a  . cloNIDine  0.1 mg Oral Daily  . diltiazem  60 mg Oral Q6H  . escitalopram  10 mg  Oral Daily  . famotidine  20 mg Oral BID  . fluticasone  2 spray Each Nare Daily  . levothyroxine  150 mcg Oral QAC breakfast  . magnesium oxide  400 mg Oral Daily  . metoprolol tartrate  25 mg Oral BID  . nicotine  21 mg Transdermal Daily  . topiramate  50 mg Oral BID  . cyanocobalamin  1,000 mcg Oral Daily  . Vitamin D (Ergocalciferol)  50,000 Units Oral Q7 days   Continuous Infusions: . sodium chloride 50 mL/hr at 04/16/18 1705     LOS: 7 days    Time spent: 25 minutes Greater than 50% of the time spent on counseling and coordinating the care.   Leisa Lenz, MD [facility] Pager [phone]  If 7PM-7AM, please contact night-coverage [email or web address] Password [email or web address] 04/22/2018, 9:07 AM

## 2018-04-22 NOTE — Progress Notes (Signed)
CSW following for disposition planning. Consulting with clinical supervisor on disposition - possible 30 day LOG while Medicaid pending, pending patient's progress and continued needs.   Patient's son planning to go to DSS to apply for Medicaid for patient today. Advised son to obtain Medicaid worker's name and contact information when application completed. Son also attempting to arrange assistance for patient at home with patient's roommate.   CSW to follow.   Abigail Butts, LCSWA [phone]

## 2018-04-23 DIAGNOSIS — I25709 Atherosclerosis of coronary artery bypass graft(s), unspecified, with unspecified angina pectoris: Secondary | ICD-10-CM

## 2018-04-23 DIAGNOSIS — I482 Chronic atrial fibrillation: Secondary | ICD-10-CM

## 2018-04-23 DIAGNOSIS — G9341 Metabolic encephalopathy: Secondary | ICD-10-CM

## 2018-04-23 DIAGNOSIS — I10 Essential (primary) hypertension: Secondary | ICD-10-CM

## 2018-04-23 DIAGNOSIS — N179 Acute kidney failure, unspecified: Principal | ICD-10-CM

## 2018-04-23 DIAGNOSIS — J449 Chronic obstructive pulmonary disease, unspecified: Secondary | ICD-10-CM

## 2018-04-23 DIAGNOSIS — I5032 Chronic diastolic (congestive) heart failure: Secondary | ICD-10-CM

## 2018-04-23 DIAGNOSIS — Z008 Encounter for other general examination: Secondary | ICD-10-CM

## 2018-04-23 LAB — BASIC METABOLIC PANEL
Anion gap: 7 (ref 5–15)
BUN: 8 mg/dL (ref 6–20)
CO2: 20 mmol/L — AB (ref 22–32)
Calcium: 8.3 mg/dL — ABNORMAL LOW (ref 8.9–10.3)
Chloride: 112 mmol/L — ABNORMAL HIGH (ref 98–111)
Creatinine, Ser: 1.23 mg/dL — ABNORMAL HIGH (ref 0.44–1.00)
GFR calc non Af Amer: 47 mL/min — ABNORMAL LOW (ref >60)
GFR, EST AFRICAN AMERICAN: 54 mL/min — AB (ref >60)
Glucose, Bld: 95 mg/dL (ref 70–99)
POTASSIUM: 3.5 mmol/L (ref 3.5–5.1)
SODIUM: 139 mmol/L (ref 135–145)

## 2018-04-23 LAB — GLUCOSE, CAPILLARY
GLUCOSE-CAPILLARY: 81 mg/dL (ref 70–99)
Glucose-Capillary: 89 mg/dL (ref 70–99)

## 2018-04-23 NOTE — Progress Notes (Signed)
PROGRESS NOTE    [name] DOB: 28-Nov-1955 DOA: 04/14/2018 PCP: Madelin Headings, MD      Brief Narrative:  62 year old female with past medical history significant forrecent hemorrhagic stroke with left-sided weakness, hypertension, hyperlipidemia, COPD, atrial fibrillation not on anticoagulants, CAD, CABG, CHF, OSA, tobacco abuse, GERD, hypothyroidism, depression, who presented with a fall and altered mental status.  Hospital course complicated with A fib with RVR and HR In 140-170's range.   Assessment & Plan:  Sepsis from UTI Resolved.  Off antibiotics  HTN -Continue metop, Cardizem -Stop clonidine  Acute metabolic encephalopathy Psych has evaluated, patient does not have capacity for medical treamtent deicisions  Hypothyoidism -Continue levothyroxine  Chronic diastolic CHF Stable  COronary disease secondary prevention -Continue spirin, statin  Aute renal failure Resolved  COPD Not active  Stroke/IVH  Paroxysmal atrial fibrillation Rate controlled -Continue aspirin, Cardixem, metoprolol  Depression -Continue wellbutrin, lexapro  Fall  Smoking  . aspirin EC  81 mg Oral Daily  . atorvastatin  40 mg Oral q1800  . buPROPion  150 mg Oral q morning - 10a  . diltiazem  60 mg Oral Q6H  . escitalopram  10 mg Oral Daily  . famotidine  20 mg Oral BID  . fluticasone  2 spray Each Nare Daily  . levothyroxine  150 mcg Oral QAC breakfast  . magnesium oxide  400 mg Oral Daily  . metoprolol tartrate  25 mg Oral BID  . nicotine  21 mg Transdermal Daily  . topiramate  50 mg Oral BID  . cyanocobalamin  1,000 mcg Oral Daily  . Vitamin D (Ergocalciferol)  50,000 Units Oral Q7 days   Recent Labs  Lab 04/19/18 0419 04/20/18 0319 04/23/18 0356  NA 138 137 139  K 3.6 3.4* 3.5  CO2 23 22 20*  BUN 10 10 8   CREATININE 1.12* 1.09* 1.23*  MG 1.4* 1.9  --   WBC  --  3.5*  --   HGB  --  11.6*  --   PLT  --  163  --      DVT prophylaxis:  SCDs Code Status: FULL Family Communication: None present MDM and disposition Plan: The below labs and imaging reports were reviewed and summarized above.    The patient was admitted with sepsis from UTI, now stable.  Now awaiting safe placement due to patient's inability to care for self at home   Consultants:     Procedures:     Antimicrobials:       Subjective: Feeling well.  No complaints. No chest pain, dizziness, dyspnea.  No orthostasis.  No fever, cough, sputum.  Objective: Vitals:   04/23/18 0512 04/23/18 0600 04/23/18 0653 04/23/18 0952  BP: 122/88 121/84 (!) 115/100 107/79  Pulse: (!) 105  (!) 112 (!) 107  Resp: (!) 24  (!) 26   Temp: 98.6 F (37 C)     TempSrc: Oral     SpO2: 100%     Weight:      Height:        Intake/Output Summary (Last 24 hours) at 04/23/2018 1130 Last data filed at 04/23/2018 1043 Gross per 24 hour  Intake 2200 ml  Output 350 ml  Net 1850 ml   Filed Weights   04/20/18 0601 04/21/18 0400 04/22/18 0325  Weight: 68.9 kg 68.6 kg 68.3 kg    Examination: General appearance:  adult female, alert and in no acute distress.  Sitting on edge of bed HEENT: Anicteric, conjunctiva  pink, lids and lashes normal. No nasal deformity, discharge, epistaxis.  Lips moist.   Skin: Warm and dry.  no jaundice.  No suspicious rashes or lesions. Cardiac: irregular, nl S1-S2, no murmurs appreciated.  Capillary refill is brisk.  JVP normal.  No LE edema.  Radia  pulses 2+ and symmetric. Respiratory: Normal respiratory rate and rhythm.  CTAB without rales or wheezes. Abdomen: Abdomen soft.  no TTP. No ascites, distension, hepatosplenomegaly.   MSK: No deformities or effusions. Neuro: Awake and alert.  EOMI, moves all extremities. Speech fluent.    Psych: Sensorium intact and responding to questions, attention normal. Affect normal.  Judgment and insight appear normal.    Data Reviewed: I have personally reviewed following labs and imaging  studies:  CBC: Recent Labs  Lab 04/20/18 0319  WBC 3.5*  HGB 11.6*  HCT 35.2*  MCV 100.6*  PLT 163   Basic Metabolic Panel: Recent Labs  Lab 04/18/18 0342 04/19/18 0419 04/20/18 0319 04/23/18 0356  NA 139 138 137 139  K 3.3* 3.6 3.4* 3.5  CL 108 106 109 112*  CO2 25 23 22  20*  GLUCOSE 94 100* 97 95  BUN 12 10 10 8   CREATININE 1.23* 1.12* 1.09* 1.23*  CALCIUM 8.7* 8.5* 8.5* 8.3*  MG  --  1.4* 1.9  --   PHOS  --  3.0  --   --    GFR: Estimated Creatinine Clearance: 46.1 mL/min (A) (by C-G formula based on SCr of 1.23 mg/dL (H)). Liver Function Tests: Recent Labs  Lab 04/19/18 0419  ALBUMIN 2.7*   No results for input(s): LIPASE, AMYLASE in the last 168 hours. No results for input(s): AMMONIA in the last 168 hours. Coagulation Profile: No results for input(s): INR, PROTIME in the last 168 hours. Cardiac Enzymes: No results for input(s): CKTOTAL, CKMB, CKMBINDEX, TROPONINI in the last 168 hours. BNP (last 3 results) No results for input(s): PROBNP in the last 8760 hours. HbA1C: No results for input(s): HGBA1C in the last 72 hours. CBG: Recent Labs  Lab 04/23/18 1125  GLUCAP 89   Lipid Profile: No results for input(s): CHOL, HDL, LDLCALC, TRIG, CHOLHDL, LDLDIRECT in the last 72 hours. Thyroid Function Tests: No results for input(s): TSH, T4TOTAL, FREET4, T3FREE, THYROIDAB in the last 72 hours. Anemia Panel: No results for input(s): VITAMINB12, FOLATE, FERRITIN, TIBC, IRON, RETICCTPCT in the last 72 hours. Urine analysis:    Component Value Date/Time   COLORURINE AMBER (A) 04/15/2018 0046   APPEARANCEUR HAZY (A) 04/15/2018 0046   LABSPEC 1.021 04/15/2018 0046   PHURINE 5.0 04/15/2018 0046   GLUCOSEU NEGATIVE 04/15/2018 0046   HGBUR SMALL (A) 04/15/2018 0046   BILIRUBINUR NEGATIVE 04/15/2018 0046   BILIRUBINUR neg 05/11/2014 2056   KETONESUR 5 (A) 04/15/2018 0046   PROTEINUR NEGATIVE 04/15/2018 0046   UROBILINOGEN 1.0 04/01/2015 1530   NITRITE  NEGATIVE 04/15/2018 0046   LEUKOCYTESUR MODERATE (A) 04/15/2018 0046   Sepsis Labs: @LABRCNTIP (procalcitonin:4,lacticacidven:4)  ) Recent Results (from the past 240 hour(s))  Urine Culture     Status: Abnormal   Collection Time: 04/15/18 12:44 AM  Result Value Ref Range Status   Specimen Description URINE, RANDOM  Final   Special Requests NONE  Final   Culture (A)  Final    <10,000 COLONIES/mL INSIGNIFICANT GROWTH Performed at [facility], [address], [place], [place] [address]    Report Status 04/16/2018 FINAL  Final  Blood Culture (routine x 2)     Status: None  Collection Time: 04/15/18  2:07 AM  Result Value Ref Range Status   Specimen Description BLOOD LEFT ANTECUBITAL  Final   Special Requests   Final    BOTTLES DRAWN AEROBIC AND ANAEROBIC Blood Culture results may not be optimal due to an inadequate volume of blood received in culture bottles   Culture   Final    NO GROWTH 5 DAYS Performed at [facility], [address], [place], [place] [address]    Report Status 04/20/2018 FINAL  Final  Blood Culture (routine x 2)     Status: None   Collection Time: 04/15/18  2:07 AM  Result Value Ref Range Status   Specimen Description BLOOD LEFT HAND  Final   Special Requests   Final    BOTTLES DRAWN AEROBIC ONLY Blood Culture results may not be optimal due to an inadequate volume of blood received in culture bottles   Culture   Final    NO GROWTH 5 DAYS Performed at [facility], [address], [place], [place] [address]    Report Status 04/20/2018 FINAL  Final         Radiology Studies: No results found.      Scheduled Meds: . aspirin EC  81 mg Oral Daily  . atorvastatin  40 mg Oral q1800  . buPROPion  150 mg Oral q morning - 10a  . diltiazem  60 mg Oral Q6H  . escitalopram  10 mg Oral Daily  . famotidine  20 mg Oral BID  . fluticasone  2 spray Each Nare Daily  . levothyroxine  150 mcg Oral QAC breakfast  . magnesium oxide  400 mg  Oral Daily  . metoprolol tartrate  25 mg Oral BID  . nicotine  21 mg Transdermal Daily  . topiramate  50 mg Oral BID  . cyanocobalamin  1,000 mcg Oral Daily  . Vitamin D (Ergocalciferol)  50,000 Units Oral Q7 days   Continuous Infusions: . sodium chloride 50 mL/hr at 04/22/18 2341     LOS: 8 days    Time spent: 25 minutes    Alberteen Sam, MD [facility] 04/23/2018, 11:30 AM     Pager [phone] --- please page though AMION:  [email or web address] Password [email or web address] If 7PM-7AM, please contact night-coverage

## 2018-04-23 NOTE — Progress Notes (Addendum)
0715 Bedside shift report. Pt resting in bed, alert, updated with POC, fall precautions in place, pt denies pain, SOB. WCTM.   0800 Pt assessed, see flow sheet. Pt A&Ox4, but confused/memory problems at times noted. Pt up and assisted to bathroom. Fall precautions in place, [facility].   1000 Pt medicated per MAR, pt dropped 2 pills, wasted and took 2 new pills for RN. Pt upset about going to rehab. Asking RN if she has a bank account set up. RN reoriented pt, pt states, "I know I'm in the hospital". WCTM.   1100 Dr. Maryfrances Bunnell at bedside, new orders received, pt updated, WCTM.   1400 Pt visiting with son [name] at bedside, updated with POC, WCTM.   1730 Pt getting bath. NAD.   1815 Pt sleeping in bed, fall precautions in place, WCTM.

## 2018-04-23 NOTE — Progress Notes (Addendum)
3:21 pm CSW seeking LOG SNF bed for patient. 30 day LOG with Medicaid pending is approved by Luci Bank, Director of Care Coordination Department. Patient's son applied for Medicaid today at [facility] DSS (Medicaid pending number [id number]). Faxed out referrals. [address] and [facility] reviewing referral.  Patient's PASRR remains under review. Patient will be assessed in person by [facility] assessor prior to approval.   CSW to follow and support.  10:17 am Patient's PASRR expires today ([id number] E valid 7/16 - 04/23/18). Patient will require new PASRR to return to a SNF. CSW started new PASRR screen and it is under review. CSW to follow.  Abigail Butts, LCSWA [phone]

## 2018-04-24 NOTE — Progress Notes (Signed)
PROGRESS NOTE    [name]  ZOX:[id number] DOB: 1955-09-10 DOA: 04/14/2018 PCP: Madelin Headings, MD      Brief Narrative:  Mrs.  [name] is a 62 y.o. F with HTN, COPD not on home O2, Afib off anticoagulants due to recent hemorrhagic stroke, depression, CAD s/p CABG in and PCI in , CHF previously reduced EF, hypothyroidism, hx CVA in 2012 and then hemorrhagic stroke in 2019 and OSA on CPAP who presents with fall and confusion, thought initially to have sepsis, started on empiric antibiotics.  Cultures negative, antibiotics stopped, patient without worsening fever/sepsis.  Unfortunately, hospital course subsequently complicated by persistent confusion, Afib with RVR.  More recently, has required Psychiatry consultation for capacity, and was judged not to have capacity for medical decision making.     Assessment & Plan:  UTI, sepsis doubted Initial presenting complaint. The patient ultimately appeared NOT to have sepsis (no evidence of end ordan damage).  She was treated with Aztreonam for 3 days, urine and blood cultures were negative.  Antibiotics were stopped and CBC and fever curve remained within normal limits.    Hypertension -Continue metoprolol, diltiazem -Lisinopril and clonidine stopped   Acute metabolic encephalopathy Dementia This appears to have been multifactorial metabolic encephalopathy from infection, Afib, recent strokes, dementia, and electrolyte abnormalities.    She has know stabilized from a cognitive standpoint, but appears to have new cognitive deficits, likely from her recent stroke.  Psych has evaluated, patient does not have capacity for medical treamtent decisions.  Hypothyoidism -Continue levothyroxine  Chronic diastolic CHF Stable, now appears euvolemic.  Coronary disease secondary prevention -Continue spirin, statin  COPD No active disease  Stroke/IVH The patient had stroke in 2012, and recently an intraventricular hemorrhage/hemorrhagic  stroke in July.  This was resolved.  Paroxysmal atrial fibrillation Rate controlled currently. -Continue aspirin, Cardizem, metoprolol  Depression -Continue Wellbutrin, Lexapro  Fall  Smoking  Other medications -Continue H2 RA -Continue topiramate -Continue Mag-Ox -Continue vitamin B12        DVT prophylaxis: SCDs Code Status: FULL Family Communication: None present MDM and disposition Plan: The below labs and imaging reports were reviewed and summarized above.  The patient's previous medical record was reviewed and summarized above.  Medication management as above.  She was admitted with altered mental status, ultimately thought to be multifactorial, possibly UTI, dehydration, A. fib with RVR, in the setting of her cognitive deficits from her recent strokes.  The patient now lacks capacity for medical decision-making, and is too high risk to discharge her home without proper supervision.  At the moment we are awaiting safe placement.   Consultants:     Procedures:     Antimicrobials:       Subjective: Discussed with nursing, no respiratory distress, fever, pain complaints overnight.  Patient reports she is well, insomnia.  No dyspnea on exertion, dizziness, respiratory distress, dyspnea, swelling.  No palpitations.  Objective: Vitals:   04/23/18 1328 04/23/18 2026 04/24/18 0613 04/24/18 1501  BP: 126/80 (!) 128/93 124/61 139/89  Pulse: 79 67 68 72  Resp: (!) 22 20 18  (!) 72  Temp: 98 F (36.7 C) 97.9 F (36.6 C) 97.7 F (36.5 C) 97.8 F (36.6 C)  TempSrc: Oral Oral Oral Oral  SpO2: 97% 100% 100%   Weight:   68.8 kg   Height:        Intake/Output Summary (Last 24 hours) at 04/24/2018 1510 Last data filed at 04/24/2018 0947 Gross per 24 hour  Intake 120 ml  Output -  Net 120 ml   Filed Weights   04/21/18 0400 04/22/18 0325 04/24/18 0613  Weight: 68.6 kg 68.3 kg 68.8 kg    Examination: General appearance: Adult female, sitting on the edge  of the bed, no acute distress, interactive HEENT: Anicteric, conjunctive are pink, lids and lashes normal.  No nasal deformity, discharge, epistaxis.  Lips moist, teeth normal, oropharynx moist, no oral lesions, hearing normal. Skin: Warm and dry, no jaundice, no suspicious rashes or lesions, mild libido in the arms and legs.  Cap refill normal. Cardiac: Heart rate normal, irregular rhythm, no murmurs, JVP normal, no lower extremity edema.  Radial pulses 2+ and symmetric. Respiratory: Normal respiratory rate and rhythm, lungs clear without rales or wheezes. Abdomen: Abdomen soft.  no TTP. No ascites, distension, hepatosplenomegaly.   MSK: No deformities or effusions. Neuro: Awake and alert, extraocular movements intact, moves all extremities with 5/5 strength and equal coordination, speech fluent.    Psych: Sensorium intact and responding to questions, attention normal, affect blunted, judgment insight    Data Reviewed: I have personally reviewed following labs and imaging studies:  CBC: Recent Labs  Lab 04/20/18 0319  WBC 3.5*  HGB 11.6*  HCT 35.2*  MCV 100.6*  PLT 163   Basic Metabolic Panel: Recent Labs  Lab 04/18/18 0342 04/19/18 0419 04/20/18 0319 04/23/18 0356  NA 139 138 137 139  K 3.3* 3.6 3.4* 3.5  CL 108 106 109 112*  CO2 25 23 22  20*  GLUCOSE 94 100* 97 95  BUN 12 10 10 8   CREATININE 1.23* 1.12* 1.09* 1.23*  CALCIUM 8.7* 8.5* 8.5* 8.3*  MG  --  1.4* 1.9  --   PHOS  --  3.0  --   --    GFR: Estimated Creatinine Clearance: 46.3 mL/min (A) (by C-G formula based on SCr of 1.23 mg/dL (H)). Liver Function Tests: Recent Labs  Lab 04/19/18 0419  ALBUMIN 2.7*   No results for input(s): LIPASE, AMYLASE in the last 168 hours. No results for input(s): AMMONIA in the last 168 hours. Coagulation Profile: No results for input(s): INR, PROTIME in the last 168 hours. Cardiac Enzymes: No results for input(s): CKTOTAL, CKMB, CKMBINDEX, TROPONINI in the last 168  hours. BNP (last 3 results) No results for input(s): PROBNP in the last 8760 hours. HbA1C: No results for input(s): HGBA1C in the last 72 hours. CBG: Recent Labs  Lab 04/23/18 1125 04/23/18 1642  GLUCAP 89 81   Lipid Profile: No results for input(s): CHOL, HDL, LDLCALC, TRIG, CHOLHDL, LDLDIRECT in the last 72 hours. Thyroid Function Tests: No results for input(s): TSH, T4TOTAL, FREET4, T3FREE, THYROIDAB in the last 72 hours. Anemia Panel: No results for input(s): VITAMINB12, FOLATE, FERRITIN, TIBC, IRON, RETICCTPCT in the last 72 hours. Urine analysis:    Component Value Date/Time   COLORURINE AMBER (A) 04/15/2018 0046   APPEARANCEUR HAZY (A) 04/15/2018 0046   LABSPEC 1.021 04/15/2018 0046   PHURINE 5.0 04/15/2018 0046   GLUCOSEU NEGATIVE 04/15/2018 0046   HGBUR SMALL (A) 04/15/2018 0046   BILIRUBINUR NEGATIVE 04/15/2018 0046   BILIRUBINUR neg 05/11/2014 2056   KETONESUR 5 (A) 04/15/2018 0046   PROTEINUR NEGATIVE 04/15/2018 0046   UROBILINOGEN 1.0 04/01/2015 1530   NITRITE NEGATIVE 04/15/2018 0046   LEUKOCYTESUR MODERATE (A) 04/15/2018 0046   Sepsis Labs: @LABRCNTIP (procalcitonin:4,lacticacidven:4)  ) Recent Results (from the past 240 hour(s))  Urine Culture     Status: Abnormal   Collection Time: 04/15/18 12:44 AM  Result Value  Ref Range Status   Specimen Description URINE, RANDOM  Final   Special Requests NONE  Final   Culture (A)  Final    <10,000 COLONIES/mL INSIGNIFICANT GROWTH Performed at [facility], [address], [place], [place] [address]    Report Status 04/16/2018 FINAL  Final  Blood Culture (routine x 2)     Status: None   Collection Time: 04/15/18  2:07 AM  Result Value Ref Range Status   Specimen Description BLOOD LEFT ANTECUBITAL  Final   Special Requests   Final    BOTTLES DRAWN AEROBIC AND ANAEROBIC Blood Culture results may not be optimal due to an inadequate volume of blood received in culture bottles   Culture   Final    NO GROWTH  5 DAYS Performed at [facility], [address] [place], [place] [address]    Report Status 04/20/2018 FINAL  Final  Blood Culture (routine x 2)     Status: None   Collection Time: 04/15/18  2:07 AM  Result Value Ref Range Status   Specimen Description BLOOD LEFT HAND  Final   Special Requests   Final    BOTTLES DRAWN AEROBIC ONLY Blood Culture results may not be optimal due to an inadequate volume of blood received in culture bottles   Culture   Final    NO GROWTH 5 DAYS Performed at [facility], [address] [place], [place] [address]    Report Status 04/20/2018 FINAL  Final         Radiology Studies: No results found.      Scheduled Meds: . aspirin EC  81 mg Oral Daily  . atorvastatin  40 mg Oral q1800  . buPROPion  150 mg Oral q morning - 10a  . diltiazem  60 mg Oral Q6H  . escitalopram  10 mg Oral Daily  . famotidine  20 mg Oral BID  . fluticasone  2 spray Each Nare Daily  . levothyroxine  150 mcg Oral QAC breakfast  . magnesium oxide  400 mg Oral Daily  . metoprolol tartrate  25 mg Oral BID  . nicotine  21 mg Transdermal Daily  . topiramate  50 mg Oral BID  . cyanocobalamin  1,000 mcg Oral Daily  . Vitamin D (Ergocalciferol)  50,000 Units Oral Q7 days   Continuous Infusions:    LOS: 9 days    Time spent: 25 minutes    Alberteen Sam, MD [facility] 04/24/2018, 3:10 PM     Pager [phone] --- please page though AMION:  [email or web address] Password [email or web address] If 7PM-7AM, please contact night-coverage

## 2018-04-24 NOTE — Progress Notes (Signed)
Pt very agitated and confused. Pt thinks that there is a monkey in her room and she says that the monkey is illegal and needs to be taken out of her room. I explained to her that there is no monkey in her room and that she is at the hospital. Pt very agitated and sat back down in her bed. I asked if I could call her son for her, but she refused and says she can do it herself. Pt says she is nausea and does not want to take her medications tonight. I explained to the patient the importance of taking her meds for her blood pressure, but once again pt got very agitated. Will continue to monitor the pt.

## 2018-04-24 NOTE — Progress Notes (Signed)
Pt IV occluded, removed per protocol. Discussed with MD, per MD ok to not restart IV at this time.   Joylene Grapes, RN, BSN

## 2018-04-24 NOTE — Care Management Important Message (Signed)
Important Message  Patient Details  Name: MARIAVICTORIA ZIKA MRN: [id number] Date of Birth: 1956/02/22   Medicare Important Message Given:  Yes    Oralia Rud Dvon Jiles 04/24/2018, 4:33 PM

## 2018-04-24 NOTE — Progress Notes (Signed)
Physical Therapy Treatment Patient Details Name: [name] MRN: [id number] DOB: Mar 05, 1956 Today's Date: 04/24/2018    History of Present Illness 62 y.o. female admitted with AMS from SNF. Believed to have UTI with possible sepsis. PMH includes but limited to: CVA right MCA, Hypotension, COPD, CAD,  CABG, B Knee surgery. Pt sustained fall at SNF    PT Comments    Patient demonstrating mild improvement with therapy, ambulating hallway 60' with min A at times to provide stability. Pt currently AO to person and place only. Agree that patient is high fall risk and will need 24/7 support after d/c. SNF recs appropriate.   Follow Up Recommendations  SNF;Supervision/Assistance - 24 hour     Equipment Recommendations  Rolling walker with 5" wheels    Recommendations for Other Services OT consult     Precautions / Restrictions Precautions Precautions: Fall Precaution Comments: tachycardia Restrictions Weight Bearing Restrictions: No    Mobility  Bed Mobility Overal bed mobility: Needs Assistance Bed Mobility: Sit to Supine     Supine to sit: Supervision Sit to supine: Min guard      Transfers Overall transfer level: Needs assistance Equipment used: 1 person hand held assist Transfers: Sit to/from Stand Sit to Stand: Min guard         General transfer comment: min guard for safety; slight unsteadiness when rising to feet  Ambulation/Gait Ambulation/Gait assistance: Min guard Gait Distance (Feet): 60 Feet Assistive device: 1 person hand held assist Gait Pattern/deviations: Step-through pattern;Decreased stride length;Drifts right/left;Trunk flexed Gait velocity: decreased   General Gait Details: pt ambulating hallway with unsteady gait, Narrow BOS, hands on guarding required to ensure safety. pt denies chest pain or dizziness this session   Stairs             Wheelchair Mobility    Modified Rankin (Stroke Patients Only)       Balance Overall  balance assessment: Needs assistance Sitting-balance support: No upper extremity supported;Feet supported Sitting balance-Leahy Scale: Good     Standing balance support: Single extremity supported;During functional activity Standing balance-Leahy Scale: Poor Standing balance comment: reliant on external support                            Cognition Arousal/Alertness: Awake/alert Behavior During Therapy: Flat affect Overall Cognitive Status: No family/caregiver present to determine baseline cognitive functioning Area of Impairment: Orientation;Awareness;Attention;Memory;Safety/judgement                 Orientation Level: Disoriented to;Place;Time;Situation Current Attention Level: Sustained Memory: Decreased short-term memory Following Commands: Follows one step commands consistently Safety/Judgement: Decreased awareness of safety;Decreased awareness of deficits Awareness: Intellectual Problem Solving: Requires verbal cues General Comments: very falt affect - able to participate in conversation and state prior history without needing redirection      Exercises      General Comments        Pertinent Vitals/Pain Pain Assessment: No/denies pain    Home Living                      Prior Function            PT Goals (current goals can now be found in the care plan section) Acute Rehab PT Goals Patient Stated Goal: "I want to get out of here" PT Goal Formulation: With patient Time For Goal Achievement: 04/23/18 Potential to Achieve Goals: Good Progress towards PT goals: Progressing toward goals  Frequency    Min 2X/week      PT Plan Current plan remains appropriate    Co-evaluation              AM-PAC PT "6 Clicks" Daily Activity  Outcome Measure  Difficulty turning over in bed (including adjusting bedclothes, sheets and blankets)?: A Little Difficulty moving from lying on back to sitting on the side of the bed? : A  Little Difficulty sitting down on and standing up from a chair with arms (e.g., wheelchair, bedside commode, etc,.)?: Unable Help needed moving to and from a bed to chair (including a wheelchair)?: A Little Help needed walking in hospital room?: A Little Help needed climbing 3-5 steps with a railing? : A Little 6 Click Score: 16    End of Session Equipment Utilized During Treatment: Gait belt Activity Tolerance: Patient tolerated treatment well Patient left: in bed;with call bell/phone within reach Nurse Communication: Mobility status PT Visit Diagnosis: Other abnormalities of gait and mobility (R26.89);Other symptoms and signs involving the nervous system (R29.898);Muscle weakness (generalized) (M62.81)     Time: 7014-1030 PT Time Calculation (min) (ACUTE ONLY): 20 min  Charges:  $Gait Training: 8-22 mins                     Etta Grandchild, PT, DPT Acute Rehab Services Pager: [phone]      Etta Grandchild 04/24/2018, 9:30 AM

## 2018-04-24 NOTE — Consult Note (Signed)
   [facility] CM Inpatient Consult   04/24/2018  [name] [birth year] [id number]   Follow up:  Patient for skilled nursing with LOG.  Patient with [employer].  Will assign [facility] social worker to follow for transitional needs as possible.  Care Coordination and follow up with inpatient care management staff.  Charlesetta Shanks, RN BSN CCM [facility]  [phone] business mobile phone Toll free office [phone]

## 2018-04-24 NOTE — Progress Notes (Signed)
CSW spoke to Corrin Parker with [facility] and confirmed patient has been assessed in-person. Information from assessment sent to [facility] office and remains under review.  [facility] and [address] offered LOG bed for patient. Patient's son has chosen [facility] and will complete admissions paperwork.   30 day LOG approved by Luci Bank, Director of Care Coordination Department on 8/15. CSW to follow and support with discharge when [facility] complete.  Abigail Butts, LCSWA [phone]

## 2018-04-25 NOTE — Progress Notes (Signed)
PROGRESS NOTE    Tina JESSON  ZOX:[id number] DOB: Jan 30, 1956 DOA: 04/14/2018 PCP: Madelin Headings, MD      Brief Narrative:  Mrs.  [name] is a 62 y.o. F with HTN, COPD not on home O2, Afib off anticoagulants due to recent hemorrhagic stroke, depression, CAD s/p CABG in and PCI in , CHF previously reduced EF, hypothyroidism, hx CVA in 2012 and then hemorrhagic stroke in 2019 and OSA on CPAP who presents with fall and confusion, thought initially to have sepsis, started on empiric antibiotics.  Cultures negative, antibiotics stopped, patient without worsening fever/sepsis.  Unfortunately, hospital course subsequently complicated by persistent confusion, Afib with RVR.  More recently, has required Psychiatry consultation for capacity, and was judged not to have capacity for medical decision making.     Assessment & Plan:  UTI, sepsis doubted Initial presenting complaint. The patient ultimately appeared NOT to have sepsis (no evidence of end ordan damage).  She was treated with Aztreonam for 3 days, urine and blood cultures were negative.  Antibiotics were stopped and CBC and fever curve remained within normal limits.    Hypertension Normotensive -Continue metoprolol, diltiazem -Lisinopril and clonidine stopped   Acute metabolic encephalopathy Dementia This appears to have been multifactorial metabolic encephalopathy from infection, Afib, recent strokes, dementia, and electrolyte abnormalities.    She has know stabilized from a cognitive standpoint, but appears to have new cognitive deficits, likely from her recent stroke.  Psych has evaluated, patient does not have capacity for medical treamtent decisions.  Hypothyoidism sTable -Continue levothyroxine  Chronic diastolic CHF Stable, now appears euvolemic.  Coronary disease secondary prevention -Continue aspirin, statin  COPD No active disease  Stroke/IVH The patient had stroke in 2012, and recently an intraventricular  hemorrhage/hemorrhagic stroke in July.  This was resolved.  Paroxysmal atrial fibrillation Rate controlled currently. -Continue aspirin, Cardizem, metoprolol  Depression -Continue Wellbutrin, Lexapro  Fall  Smoking  Other medications -Continue Pepcid, topiramate, Mag-Ox, vitamin B12       DVT prophylaxis: SCDs Code Status: FULL Family Communication: None present MDM and disposition Plan: The below labs and imaging reports were reviewed and summarized above.  The patient's previous medical record was reviewed and summarized above.  Acacian management as above  She was admitted with altered mental status, ultimately thought to be multifactorial, possibly UTI, dehydration, A. fib with RVR, in the setting of her cognitive deficits from her recent strokes.  Patient now lacks capacity for medical decision-making, and continues to do so.  As a result of that she is too high risk to discharge home without proper supervision.  At the moment we are awaiting SNF placement   Consultants:     Procedures:     Antimicrobials:       Subjective: Discussed with nursing, no respiratory distress, fever, pain complaints overnight.  Patient reports she is well, insomnia.  No dyspnea on exertion, dizziness, respiratory distress, dyspnea, swelling.  No palpitations.  Objective: Vitals:   04/24/18 2105 04/25/18 0407 04/25/18 0413 04/25/18 1525  BP: 115/85  (!) 135/99 (!) 120/91  Pulse: 88  67 (!) 56  Resp:      Temp: 98.2 F (36.8 C)  (!) 97.4 F (36.3 C) (!) 97.4 F (36.3 C)  TempSrc: Oral  Oral Oral  SpO2: 99%  100% 100%  Weight:  68.4 kg    Height:       No intake or output data in the 24 hours ending 04/25/18 1607 Filed Weights   04/22/18 0325  04/24/18 0613 04/25/18 0407  Weight: 68.3 kg 68.8 kg 68.4 kg    Examination: General appearance: Adult female, lying in bed, arouses easily, no acute distress HEENT: Anicteric, conjunctive are pink, lids and lashes normal.  No  nasal deformity, discharge, epistaxis.  Lips moist, teeth normal, oropharynx moist, no oral lesions, hearing normal. Skin: Warm and dry, no jaundice, no suspicious rashes or lesions, mild libido in the arms and legs.  Cap refill normal. Cardiac: RRR, no murmurs, no lower extremity edema Respiratory: Normal respiratory rate and rhythm, lungs clear without rales or wheezes. Abdomen: Abdomen soft.  no TTP. No ascites, distension, hepatosplenomegaly.   MSK: No deformities or effusions. Neuro: Awake and alert, extraocular movements intact, moves all extremities with 5/5 strength and equal coordination, speech fluent.    Psych: Sensorium intact and responding to questions, attention normal, affect blunted, judgment insight    Data Reviewed: I have personally reviewed following labs and imaging studies:  CBC: Recent Labs  Lab 04/20/18 0319  WBC 3.5*  HGB 11.6*  HCT 35.2*  MCV 100.6*  PLT 163   Basic Metabolic Panel: Recent Labs  Lab 04/19/18 0419 04/20/18 0319 04/23/18 0356  NA 138 137 139  K 3.6 3.4* 3.5  CL 106 109 112*  CO2 23 22 20*  GLUCOSE 100* 97 95  BUN 10 10 8   CREATININE 1.12* 1.09* 1.23*  CALCIUM 8.5* 8.5* 8.3*  MG 1.4* 1.9  --   PHOS 3.0  --   --    GFR: Estimated Creatinine Clearance: 46.2 mL/min (A) (by C-G formula based on SCr of 1.23 mg/dL (H)). Liver Function Tests: Recent Labs  Lab 04/19/18 0419  ALBUMIN 2.7*   No results for input(s): LIPASE, AMYLASE in the last 168 hours. No results for input(s): AMMONIA in the last 168 hours. Coagulation Profile: No results for input(s): INR, PROTIME in the last 168 hours. Cardiac Enzymes: No results for input(s): CKTOTAL, CKMB, CKMBINDEX, TROPONINI in the last 168 hours. BNP (last 3 results) No results for input(s): PROBNP in the last 8760 hours. HbA1C: No results for input(s): HGBA1C in the last 72 hours. CBG: Recent Labs  Lab 04/23/18 1125 04/23/18 1642  GLUCAP 89 81   Lipid Profile: No results for  input(s): CHOL, HDL, LDLCALC, TRIG, CHOLHDL, LDLDIRECT in the last 72 hours. Thyroid Function Tests: No results for input(s): TSH, T4TOTAL, FREET4, T3FREE, THYROIDAB in the last 72 hours. Anemia Panel: No results for input(s): VITAMINB12, FOLATE, FERRITIN, TIBC, IRON, RETICCTPCT in the last 72 hours. Urine analysis:    Component Value Date/Time   COLORURINE AMBER (A) 04/15/2018 0046   APPEARANCEUR HAZY (A) 04/15/2018 0046   LABSPEC 1.021 04/15/2018 0046   PHURINE 5.0 04/15/2018 0046   GLUCOSEU NEGATIVE 04/15/2018 0046   HGBUR SMALL (A) 04/15/2018 0046   BILIRUBINUR NEGATIVE 04/15/2018 0046   BILIRUBINUR neg 05/11/2014 2056   KETONESUR 5 (A) 04/15/2018 0046   PROTEINUR NEGATIVE 04/15/2018 0046   UROBILINOGEN 1.0 04/01/2015 1530   NITRITE NEGATIVE 04/15/2018 0046   LEUKOCYTESUR MODERATE (A) 04/15/2018 0046   Sepsis Labs: @LABRCNTIP (procalcitonin:4,lacticacidven:4)  ) No results found for this or any previous visit (from the past 240 hour(s)).       Radiology Studies: No results found.      Scheduled Meds: . aspirin EC  81 mg Oral Daily  . atorvastatin  40 mg Oral q1800  . buPROPion  150 mg Oral q morning - 10a  . diltiazem  60 mg Oral Q6H  . escitalopram  10 mg Oral Daily  . famotidine  20 mg Oral BID  . fluticasone  2 spray Each Nare Daily  . levothyroxine  150 mcg Oral QAC breakfast  . magnesium oxide  400 mg Oral Daily  . metoprolol tartrate  25 mg Oral BID  . nicotine  21 mg Transdermal Daily  . topiramate  50 mg Oral BID  . cyanocobalamin  1,000 mcg Oral Daily  . Vitamin D (Ergocalciferol)  50,000 Units Oral Q7 days   Continuous Infusions:    LOS: 10 days    Time spent: 15 minutes    Alberteen Sam, MD [facility] 04/25/2018, 4:07 PM     Pager [phone] --- please page though AMION:  [email or web address] Password [email or web address] If 7PM-7AM, please contact night-coverage

## 2018-04-25 NOTE — Plan of Care (Signed)
Ate 50% of breakfast today.

## 2018-04-26 NOTE — Progress Notes (Signed)
PROGRESS NOTE    [name]  QKM:[id number] DOB: Jun 11, 1956 DOA: 04/14/2018 PCP: Madelin Headings, MD      Brief Narrative:  Mrs.  [name] is a 62 y.o. F with HTN, COPD not on home O2, Afib off anticoagulants due to recent hemorrhagic stroke, depression, CAD s/p CABG in and PCI in , CHF previously reduced EF, hypothyroidism, hx CVA in 2012 and then hemorrhagic stroke in 2019 and OSA on CPAP who presents with fall and confusion, thought initially to have sepsis, started on empiric antibiotics.  Cultures negative, antibiotics stopped, patient without worsening fever/sepsis.  Unfortunately, hospital course subsequently complicated by persistent confusion, Afib with RVR.  More recently, has required Psychiatry consultation for capacity, and was judged not to have capacity for medical decision making.     Assessment & Plan:  UTI, sepsis doubted Initial presenting complaint. The patient ultimately appeared NOT to have sepsis (no evidence of end ordan damage).  She was treated with Aztreonam for 3 days, urine and blood cultures were negative.  Antibiotics were stopped and CBC and fever curve remained within normal limits.    Hypertension Normotensive -Continue metoprolol, diltiazem -Lisinopril and clonidine stopped   Acute metabolic encephalopathy Dementia This appears to have been multifactorial metabolic encephalopathy from infection, Afib, recent strokes, dementia, and electrolyte abnormalities.    She has know stabilized from a cognitive standpoint, but appears to have new cognitive deficits, likely from her recent stroke.  Psych has evaluated, patient does not have capacity for medical treamtent decisions.  Hypothyoidism Stable -Continue levothyroxine  Chronic diastolic CHF Stable, now appears euvolemic.  Coronary disease secondary prevention -Continue aspirin, statin  COPD No active disease  Stroke/IVH The patient had stroke in 2012, and recently an intraventricular  hemorrhage/hemorrhagic stroke in July.  This was resolved.  Paroxysmal atrial fibrillation Heart rate well controlled -Continue aspirin, Cardizem, metoprolol  Depression -Continue Wellbutrin, Lexapro  Fall  Smoking  Other medications -Continue Pepcid, topiramate, Mag-Ox, vitamin B12       DVT prophylaxis: SCDs Code Status: FULL Family Communication: None present MDM and disposition Plan: The below labs and imaging reports were reviewed and summarized above.  The patient's previous medical record was reviewed and summarized above.  Acacian management as above  She was admitted with altered mental status, ultimately thought to be multifactorial, possibly UTI, dehydration, A. fib with RVR, in the setting of her cognitive deficits from her recent strokes.  Ask capacity for medical decision-making as a result of that she is too high risk to be discharged home, placement is pending in a safe setting.   Consultants:     Procedures:     Antimicrobials:       Subjective: Discussed case with nursing, no new fever, respiratory distress, confusion, agitation, pain complaints.  Patient low appetite, sad mood, but otherwise no palpitations, chest pain, dyspnea.  Objective: Vitals:   04/25/18 2133 04/26/18 0346 04/26/18 0347 04/26/18 1419  BP: 116/80 117/82  124/84  Pulse: (!) 47 60  61  Resp:      Temp: 97.7 F (36.5 C) 98.2 F (36.8 C)  98.8 F (37.1 C)  TempSrc: Oral Oral  Oral  SpO2: 99% 99%  98%  Weight:   68.3 kg   Height:       No intake or output data in the 24 hours ending 04/26/18 1612 Filed Weights   04/24/18 0613 04/25/18 0407 04/26/18 0347  Weight: 68.8 kg 68.4 kg 68.3 kg    Examination: General appearance: Adult  female, sitting on edge of bed, interactive, conversational HEENT: Anicteric, conjunctive are pink, lids and lashes normal.  No nasal deformity, discharge, epistaxis.  Lips moist, teeth normal, oropharynx moist, no oral lesions, hearing  normal. Skin: Warm and dry, no jaundice, no suspicious rashes or lesions, mild libido in the arms and legs.  Cap refill normal. Cardiac: Regular rate and rhythm, no lower extremity edema Respiratory: Normal respiratory rate and rhythm, lungs clear to auscultation Abdomen: Abdomen soft.  no TTP. No ascites, distension, hepatosplenomegaly.   MSK: No deformities or effusions. Neuro: Awake and alert, extraocular movements intact, moves all extremities with 5/5 strength and equal coordination, speech fluent.    Psych: Affect very flat.  Attention normal.   Data Reviewed: I have personally reviewed following labs and imaging studies:  CBC: Recent Labs  Lab 04/20/18 0319  WBC 3.5*  HGB 11.6*  HCT 35.2*  MCV 100.6*  PLT 163   Basic Metabolic Panel: Recent Labs  Lab 04/20/18 0319 04/23/18 0356  NA 137 139  K 3.4* 3.5  CL 109 112*  CO2 22 20*  GLUCOSE 97 95  BUN 10 8  CREATININE 1.09* 1.23*  CALCIUM 8.5* 8.3*  MG 1.9  --    GFR: Estimated Creatinine Clearance: 46.1 mL/min (A) (by C-G formula based on SCr of 1.23 mg/dL (H)). Liver Function Tests: No results for input(s): AST, ALT, ALKPHOS, BILITOT, PROT, ALBUMIN in the last 168 hours. No results for input(s): LIPASE, AMYLASE in the last 168 hours. No results for input(s): AMMONIA in the last 168 hours. Coagulation Profile: No results for input(s): INR, PROTIME in the last 168 hours. Cardiac Enzymes: No results for input(s): CKTOTAL, CKMB, CKMBINDEX, TROPONINI in the last 168 hours. BNP (last 3 results) No results for input(s): PROBNP in the last 8760 hours. HbA1C: No results for input(s): HGBA1C in the last 72 hours. CBG: Recent Labs  Lab 04/23/18 1125 04/23/18 1642  GLUCAP 89 81   Lipid Profile: No results for input(s): CHOL, HDL, LDLCALC, TRIG, CHOLHDL, LDLDIRECT in the last 72 hours. Thyroid Function Tests: No results for input(s): TSH, T4TOTAL, FREET4, T3FREE, THYROIDAB in the last 72 hours. Anemia Panel: No  results for input(s): VITAMINB12, FOLATE, FERRITIN, TIBC, IRON, RETICCTPCT in the last 72 hours. Urine analysis:    Component Value Date/Time   COLORURINE AMBER (A) 04/15/2018 0046   APPEARANCEUR HAZY (A) 04/15/2018 0046   LABSPEC 1.021 04/15/2018 0046   PHURINE 5.0 04/15/2018 0046   GLUCOSEU NEGATIVE 04/15/2018 0046   HGBUR SMALL (A) 04/15/2018 0046   BILIRUBINUR NEGATIVE 04/15/2018 0046   BILIRUBINUR neg 05/11/2014 2056   KETONESUR 5 (A) 04/15/2018 0046   PROTEINUR NEGATIVE 04/15/2018 0046   UROBILINOGEN 1.0 04/01/2015 1530   NITRITE NEGATIVE 04/15/2018 0046   LEUKOCYTESUR MODERATE (A) 04/15/2018 0046   Sepsis Labs: @LABRCNTIP (procalcitonin:4,lacticacidven:4)  ) No results found for this or any previous visit (from the past 240 hour(s)).       Radiology Studies: No results found.      Scheduled Meds: . aspirin EC  81 mg Oral Daily  . atorvastatin  40 mg Oral q1800  . buPROPion  150 mg Oral q morning - 10a  . diltiazem  60 mg Oral Q6H  . escitalopram  10 mg Oral Daily  . famotidine  20 mg Oral BID  . fluticasone  2 spray Each Nare Daily  . levothyroxine  150 mcg Oral QAC breakfast  . magnesium oxide  400 mg Oral Daily  . metoprolol tartrate  25 mg Oral BID  . nicotine  21 mg Transdermal Daily  . topiramate  50 mg Oral BID  . cyanocobalamin  1,000 mcg Oral Daily  . Vitamin D (Ergocalciferol)  50,000 Units Oral Q7 days   Continuous Infusions:    LOS: 11 days    Time spent: 15 minutes    Alberteen Sam, MD [facility] 04/26/2018, 4:12 PM     Pager [phone] --- please page though AMION:  [email or web address] If 7PM-7AM, please contact night-coverage

## 2018-04-27 LAB — CBC
HCT: 36.2 % (ref 36.0–46.0)
HEMOGLOBIN: 11.8 g/dL — AB (ref 12.0–15.0)
MCH: 33 pg (ref 26.0–34.0)
MCHC: 32.6 g/dL (ref 30.0–36.0)
MCV: 101.1 fL — AB (ref 78.0–100.0)
Platelets: 206 10*3/uL (ref 150–400)
RBC: 3.58 MIL/uL — AB (ref 3.87–5.11)
RDW: 11.9 % (ref 11.5–15.5)
WBC: 3.6 10*3/uL — AB (ref 4.0–10.5)

## 2018-04-27 LAB — BASIC METABOLIC PANEL
ANION GAP: 7 (ref 5–15)
BUN: 6 mg/dL (ref 6–20)
CHLORIDE: 110 mmol/L (ref 98–111)
CO2: 23 mmol/L (ref 22–32)
Calcium: 8.8 mg/dL — ABNORMAL LOW (ref 8.9–10.3)
Creatinine, Ser: 1.27 mg/dL — ABNORMAL HIGH (ref 0.44–1.00)
GFR calc non Af Amer: 45 mL/min — ABNORMAL LOW (ref >60)
GFR, EST AFRICAN AMERICAN: 52 mL/min — AB (ref >60)
Glucose, Bld: 94 mg/dL (ref 70–99)
Potassium: 4 mmol/L (ref 3.5–5.1)
SODIUM: 140 mmol/L (ref 135–145)

## 2018-04-27 NOTE — Discharge Summary (Signed)
Physician Discharge Summary  [name] ZOX:[id number] DOB: [birth year] DOA: 04/14/2018  PCP: Tina Headings, MD  Admit date: 04/14/2018 Discharge date: 04/27/2018  Admitted From: [name]  Disposition:  SNF   Recommendations for Outpatient Follow-up:  1. Follow up with PCP in 1-2 weeks 2. Please obtain BMP/CBC in one week  Home Health: None  Equipment/Devices: None  Discharge Condition: Fair  CODE STATUS: FULL Diet recommendation: Cardiac  Brief/Interim Summary: Mrs.  [name] is a 62 y.o. F with HTN, COPD not on home O2, Afib off anticoagulants due to recent hemorrhagic stroke, depression, CAD s/p CABG in and PCI in , CHF previously reduced EF, hypothyroidism, hx CVA in 2012 and then hemorrhagic stroke in 2019 and OSA on CPAP who presents with fall and confusion, thought initially to have sepsis, started on empiric antibiotics.  Cultures negative, antibiotics stopped, patient without worsening fever/sepsis.  Unfortunately, [name] course subsequently complicated by persistent confusion, Afib with RVR.  More recently, has required Psychiatry consultation for capacity, and was judged not to have capacity for medical decision making.      Discharge Diagnoses:   Acute metabolic encephalopathy Dementia This appears to have been multifactorial metabolic encephalopathy from dehdyration, possible cystitis, Afib, recent strokes, dementia, and electrolyte abnormalities.    She has know stabilized from a cognitive standpoint, but appears to have new cognitive deficits, likely from her recent stroke resulting in dementia.    Psych at [name] evaluated the patient, felt she did not have capacity for medical treatment decisions.    Possible cystitis Sepsis ruled out Initial concern for sepsis, however there was no organ damage or lactic acidosis.   She was treated with Aztreonam for 3 days, urine and blood cultures were negative.  Antibiotics were stopped and CBC and fever curve  remained within normal limits.   Hypertension Normotensive on metoprolol, diltiazem alone.   Clonidine and lisinopril caused hypotension.  Hypothyoidism  Chronic diastolic CHF Stable, now appears euvolemic.  Coronary disease secondary prevention  COPD No active disease  Stroke/IVH The patient had stroke in 2012, and recently an intraventricular hemorrhage/hemorrhagic stroke in July.    Paroxysmal atrial fibrillation Heart rate well controlled.  Anticoagulation is contraindicated with recent IVH.  Rates controlled on diltiazem and metoprolol.    Depression Appears stable on Wellbutrin, Lexapro  Smoking Smoking cessation recommended.     Discharge Instructions  Discharge Instructions    AMB Referral to [name] Care Management   Complete by:  As directed    Please assign to [name] social worker for follow up at [name].    Patient's son is contact.  Patient has been deemed to not have capacity.  She is a HTA patient and her SNF was not [place].  [name] signed LOG today.  [name] Care Management to follow up as son is trying to get Medicaid. He is the contact person. Please see multiple notes for details.  We may need to assist with personal care services.  Please assess for community needs as appropriate.  Tina Shanks, RN BSN CCM [facility]  [phone] business mobile phone Toll free office [phone]   Reason for consult:  Please assign to social worker for transitional care needs   Diagnoses of:   COPD/ Pneumonia Stroke: Ischemic/TIA     Expected date of contact:  1-3 days (reserved for [name] discharges)   Call MD for:  persistant nausea and vomiting   Complete by:  As directed    Call MD for:  redness, tenderness, or signs of infection (pain, swelling, redness, odor or green/yellow discharge around incision site)   Complete by:  As directed    Call MD for:  severe uncontrolled pain   Complete by:  As directed    Diet - low  sodium heart healthy   Complete by:  As directed    Discharge instructions   Complete by:  As directed    Continue Cardizem and metoprolol for heart rate control. Hold off on clonidine and lisinopril due to risk of hypotension.   Increase activity slowly   Complete by:  As directed      Allergies as of 04/27/2018      Reactions   Avelox [moxifloxacin Hcl In Nacl] Other (See Comments)   Mental breakdown.   Pamelor [nortriptyline Hcl] Other (See Comments)   Made her want to hurt herself   Amoxicillin Hives   Hydrocodone Itching   Oxycodone Itching   Penicillins Hives      Sulfa Antibiotics Other (See Comments)   unknown      Medication List    STOP taking these medications   cloNIDine 0.1 MG tablet Commonly known as:  CATAPRES   diltiazem 120 MG 12 hr capsule Commonly known as:  CARDIZEM SR Replaced by:  diltiazem 60 MG tablet   lisinopril 10 MG tablet Commonly known as:  PRINIVIL,ZESTRIL     TAKE these medications   acetaminophen 325 MG tablet Commonly known as:  TYLENOL Take 650 mg by mouth every 6 (six) hours as needed for mild pain.   aspirin 81 MG EC tablet Take 1 tablet (81 mg total) by mouth daily.   atorvastatin 40 MG tablet Commonly known as:  LIPITOR Take 1 tablet (40 mg total) by mouth daily at 6 PM.   buPROPion 150 MG 24 hr tablet Commonly known as:  WELLBUTRIN XL Take 1 tablet (150 mg total) by mouth every morning.   butalbital-acetaminophen-caffeine 50-325-40 MG tablet Commonly known as:  FIORICET, ESGIC Take 1 tablet by mouth every 8 (eight) hours as needed for headache.   cyanocobalamin 1000 MCG tablet Take 1 tablet (1,000 mcg total) by mouth daily.   diltiazem 60 MG tablet Commonly known as:  CARDIZEM Take 1 tablet (60 mg total) by mouth every 6 (six) hours. Replaces:  diltiazem 120 MG 12 hr capsule   escitalopram 10 MG tablet Commonly known as:  LEXAPRO Take 1 tablet (10 mg total) by mouth daily.   fluticasone 50 MCG/ACT nasal  spray Commonly known as:  FLONASE Place 2 sprays into both nostrils daily.   levothyroxine 200 MCG tablet Commonly known as:  SYNTHROID, LEVOTHROID Take 1 tablet (200 mcg total) by mouth daily before breakfast.   magnesium oxide 400 MG tablet Commonly known as:  MAG-OX Take 1 tablet (400 mg total) by mouth daily.   metoprolol tartrate 25 MG tablet Commonly known as:  LOPRESSOR Take 1 tablet (25 mg total) by mouth 2 (two) times daily.   mirtazapine 15 MG tablet Commonly known as:  REMERON Take 15 mg by mouth at bedtime.   PROAIR HFA 108 (90 Base) MCG/ACT inhaler Generic drug:  albuterol INHALE 2 PUFFS BY MOUTH EVERY 6 HOURS AS NEEDED FOR SHORTNESS OF BREATH   ranitidine 150 MG tablet Commonly known as:  ZANTAC Take 1 tablet (150 mg total) by mouth 2 (two) times daily.   topiramate 50 MG tablet Commonly known as:  TOPAMAX Take 1 tablet (50 mg total) by mouth 2 (two) times daily.  Vitamin D (Ergocalciferol) 50000 units Caps capsule Commonly known as:  DRISDOL Take 50,000 Units by mouth every 7 (seven) days. On Friday       Contact information for follow-up providers    Panosh, Neta Mends, MD. Schedule an appointment as soon as possible for a visit in 1 week(s).   Specialties:  Internal Medicine, Pediatrics Contact information: [address] [place] [address] [phone]        Lars Masson, MD .   Specialty:  Cardiology Contact information: [address] [place] [address] [phone]            Contact information for after-discharge care    Destination    [name] .   Service:  Skilled Nursing Contact information: [address] [place] [address] [phone]                 Allergies  Allergen Reactions  . Avelox [Moxifloxacin Hcl In Nacl] Other (See Comments)    Mental breakdown.  [name] [Nortriptyline Hcl] Other (See Comments)    Made her want to hurt  herself  . Amoxicillin Hives  . Hydrocodone Itching  . Oxycodone Itching  . Penicillins Hives       . Sulfa Antibiotics Other (See Comments)    unknown    Consultations:  Psychiatry  Neurology    Procedures/Studies: Dg Chest 2 View  Result Date: 04/15/2018 CLINICAL DATA:  Unwitnessed fall with facial droop and slurred speech. EXAM: CHEST - 2 VIEW COMPARISON:  03/25/2018 FINDINGS: Patient is status post CABG. Nonaneurysmal atherosclerotic aorta is noted. There is stable cardiomegaly with diffuse interstitial prominence suspicious for mild interstitial edema. No effusion or pneumothorax. Degenerative changes are present along dorsal spine. Surgical clips project over the right upper thorax. IMPRESSION: Cardiomegaly with aortic atherosclerosis and post CABG change. Mild diffuse interstitial prominence may reflect a component of mild interstitial edema. Electronically Signed   By: Tollie Eth M.D.   On: 04/15/2018 01:23   Ct Angio Chest Pe W And/or Wo Contrast  Result Date: 04/15/2018 CLINICAL DATA:  Shortness of breath. EXAM: CT ANGIOGRAPHY CHEST WITH CONTRAST TECHNIQUE: Multidetector CT imaging of the chest was performed using the standard protocol during bolus administration of intravenous contrast. Multiplanar CT image reconstructions and MIPs were obtained to evaluate the vascular anatomy. CONTRAST:  80mL ISOVUE-370 IOPAMIDOL (ISOVUE-370) INJECTION 76% COMPARISON:  Chest CT 11/16/2010 Chest radiograph 04/15/2018 FINDINGS: Cardiovascular: --Pulmonary arteries: Contrast injection is sufficient to demonstrate satisfactory opacification of the pulmonary arteries to the segmental level. There is no pulmonary embolus. The main pulmonary artery is within normal limits for size. --Aorta: Limited opacification of the aorta due to bolus timing optimization for the pulmonary arteries. Conventional 3 vessel aortic branching pattern. The aortic course and caliber are normal. There is no aortic  atherosclerosis. --Heart: Mild cardiomegaly. No pericardial effusion. Mediastinum/Nodes: No mediastinal, hilar or axillary lymphadenopathy. The visualized thyroid and thoracic esophageal course are unremarkable. Lungs/Pleura: No pulmonary nodules or masses. No pleural effusion or pneumothorax. No focal airspace consolidation. No focal pleural abnormality. Upper Abdomen: Contrast bolus timing is not optimized for evaluation of the abdominal organs. Within this limitation, the visualized organs of the upper abdomen are normal. Musculoskeletal: No chest wall abnormality. No acute or significant osseous findings. Review of the MIP images confirms the above findings. IMPRESSION: 1. No pulmonary embolus. 2. Mild cardiomegaly. 3. No acute thoracic abnormality. Electronically Signed   By: Chrisandra Netters.D.  On: 04/15/2018 05:01   Ct Head Code Stroke Wo Contrast  Result Date: 04/15/2018 CLINICAL DATA:  Code stroke. Left-sided facial droop and right arm weakness. EXAM: CT HEAD WITHOUT CONTRAST TECHNIQUE: Contiguous axial images were obtained from the base of the skull through the vertex without intravenous contrast. COMPARISON:  03/25/2018 head CT FINDINGS: Brain: There is no acute hemorrhage or extra-axial collection. There is debris within the left lateral ventricle compatible with resolving blood products. There is generalized atrophy without lobar predilection. Old infarct in the right MCA territory with associated encephalomalacia. There is hypoattenuation of the periventricular white matter, most commonly indicating chronic ischemic microangiopathy. Vascular: No abnormal hyperdensity of the major intracranial arteries or dural venous sinuses. No intracranial atherosclerosis. Skull: The visualized skull base, calvarium and extracranial soft tissues are normal. Sinuses/Orbits: Moderate ethmoid and maxillary sinus mucosal thickening. The orbits are normal. ASPECTS [facility] Stroke Program Early CT Score) - Ganglionic  level infarction (caudate, lentiform nuclei, internal capsule, insula, M1-M3 cortex): 7 - Supraganglionic infarction (M4-M6 cortex): 3 Total score (0-10 with 10 being normal): 10 IMPRESSION: 1. No acute hemorrhage. 2. Resolving blood products in the left lateral ventricle and encephalomalacia at the site of prior right MCA infarct. 3. ASPECTS is 10. These results were communicated to Dr. Georgiana Spinner Aroor at 12:07 am on 04/15/2018 by text page via the [name] messaging system. Electronically Signed   By: Deatra Robinson M.D.   On: 04/15/2018 00:09       Subjective: Feeling well.  Appetite fine.  No chest pain, palpitations, abdominal pain, dyspnea, confusion, fever.  Discharge Exam: Vitals:   04/27/18 0827 04/27/18 1155  BP:  114/84  Pulse: 67 62  Resp:  20  Temp:  98.9 F (37.2 C)  SpO2:  98%   Vitals:   04/27/18 0337 04/27/18 0825 04/27/18 0827 04/27/18 1155  BP: 115/86 131/79  114/84  Pulse: 63  67 62  Resp: 20   20  Temp: 98 F (36.7 C)   98.9 F (37.2 C)  TempSrc: Oral   Oral  SpO2: 100%   98%  Weight: 68.2 kg     Height:        General: Pt is alert, awake, not in acute distress, lying in bed, interactive Cardiovascular: RRR, S1/S2 +, no rubs, no gallops Respiratory: CTA bilaterally, no wheezing, no rhonchi Abdominal: Soft, NT, ND, bowel sounds + Extremities: no edema, no cyanosis    The results of significant diagnostics from this hospitalization (including imaging, microbiology, ancillary and laboratory) are listed below for reference.     Microbiology: No results found for this or any previous visit (from the past 240 hour(s)).   Labs: BNP (last 3 results) Recent Labs    03/25/18 2016  BNP 608.8*   Basic Metabolic Panel: Recent Labs  Lab 04/23/18 0356 04/27/18 0632  NA 139 140  K 3.5 4.0  CL 112* 110  CO2 20* 23  GLUCOSE 95 94  BUN 8 6  CREATININE 1.23* 1.27*  CALCIUM 8.3* 8.8*   Liver Function Tests: No results for input(s): AST, ALT, ALKPHOS,  BILITOT, PROT, ALBUMIN in the last 168 hours. No results for input(s): LIPASE, AMYLASE in the last 168 hours. No results for input(s): AMMONIA in the last 168 hours. CBC: Recent Labs  Lab 04/27/18 0632  WBC 3.6*  HGB 11.8*  HCT 36.2  MCV 101.1*  PLT 206   Cardiac Enzymes: No results for input(s): CKTOTAL, CKMB, CKMBINDEX, TROPONINI in the last 168 hours. BNP: Invalid input(s):  POCBNP CBG: Recent Labs  Lab 04/23/18 1125 04/23/18 1642  GLUCAP 89 81   D-Dimer No results for input(s): DDIMER in the last 72 hours. Hgb A1c No results for input(s): HGBA1C in the last 72 hours. Lipid Profile No results for input(s): CHOL, HDL, LDLCALC, TRIG, CHOLHDL, LDLDIRECT in the last 72 hours. Thyroid function studies No results for input(s): TSH, T4TOTAL, T3FREE, THYROIDAB in the last 72 hours.  Invalid input(s): FREET3 Anemia work up No results for input(s): VITAMINB12, FOLATE, FERRITIN, TIBC, IRON, RETICCTPCT in the last 72 hours. Urinalysis    Component Value Date/Time   COLORURINE AMBER (A) 04/15/2018 0046   APPEARANCEUR HAZY (A) 04/15/2018 0046   LABSPEC 1.021 04/15/2018 0046   PHURINE 5.0 04/15/2018 0046   GLUCOSEU NEGATIVE 04/15/2018 0046   HGBUR SMALL (A) 04/15/2018 0046   BILIRUBINUR NEGATIVE 04/15/2018 0046   BILIRUBINUR neg 05/11/2014 2056   KETONESUR 5 (A) 04/15/2018 0046   PROTEINUR NEGATIVE 04/15/2018 0046   UROBILINOGEN 1.0 04/01/2015 1530   NITRITE NEGATIVE 04/15/2018 0046   LEUKOCYTESUR MODERATE (A) 04/15/2018 0046   Sepsis Labs Invalid input(s): PROCALCITONIN,  WBC,  LACTICIDVEN Microbiology No results found for this or any previous visit (from the past 240 hour(s)).   Time coordinating discharge: 20 minutes       SIGNED:   Alberteen Sam, MD  [facility] 04/27/2018, 11:59 AM

## 2018-04-27 NOTE — [place] FL2 (Signed)
[place] MEDICAID FL2 LEVEL OF CARE SCREENING TOOL     IDENTIFICATION  Patient Name: LEEBA [name] Birthdate: Sep 21, 1955 Sex: female Admission Date (Current Location): 04/14/2018  [facility] and [place] Number:  Producer, television/film/video and Address:  [facility], [address], [place], [place] [address]      Provider Number: [id number]  Attending Physician Name and Address:  Alberteen Sam, *  Relative Name and Phone Number:  [name], son, [phone]    Current Level of Care: Hospital Recommended Level of Care: Skilled Nursing Facility Prior Approval Number:    Date Approved/Denied:   PASRR Number: [id number] F (valid 8/19 - 07/26/18)  Discharge Plan: SNF    Current Diagnoses: Patient Active Problem List   Diagnosis Date Noted  . Evaluation by psychiatric service required   . GERD (gastroesophageal reflux disease) 04/15/2018  . UTI (urinary tract infection) 04/15/2018  . Atrial fibrillation, chronic (HCC) 04/15/2018  . Depression 04/15/2018  . Fall 04/15/2018  . Tobacco abuse 04/15/2018  . Sepsis (HCC) 04/15/2018  . Acute metabolic encephalopathy 04/15/2018  . Hypertensive emergency 03/25/2018  . NSTEMI (non-ST elevated myocardial infarction) (HCC)   . Troponin level elevated   . IVH (intraventricular hemorrhage) (HCC) 03/19/2018  . 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 Long term current use of amiodarone 03/27/2017  . Severe recurrent major depression without psychotic features (HCC) 11/19/2016    Class: Chronic  . Pre-procedure lab exam 09/13/2016  . Generalized headaches 07/04/2016  . Protein-calorie malnutrition, severe (HCC) 04/03/2015  . Chronic diastolic CHF (congestive heart failure), NYHA class 2 (HCC) 01/24/2015  . Demand ischemia secondary to AF with RVR 12/13/2014  . CKD (chronic kidney disease), stage III (HCC) 11/11/2014  . Hypokalemia 05/22/2014  . Mood disorder (HCC) 03/20/2014  . HTN (hypertension) 03/20/2014   . Anemia, unspecified 03/20/2014  . Smoker 03/20/2014  . Atrial fibrillation with RVR (HCC) 03/03/2014  . AKI (acute kidney injury) (HCC) 03/03/2014  . Hypotension 03/03/2014  . Pulmonary hypertension (HCC) 03/03/2014  . COPD (chronic obstructive pulmonary disease) (HCC) 03/03/2014  . H/O: CVA (cerebrovascular accident)   . Long-term (current) use of anticoagulants   . Cardiomyopathy-h/o tachycardia mediated-EF 65% per echo March 2016 02/18/2014  . Hyperlipidemia 02/18/2014  . CAD '07, LAD PCI 2012, SVG-PDA PTCA 11/10/14 02/16/2014  . Chronic diastolic heart failure (HCC)   . Pulmonary nodules   . Hypothyroidism   . OSA- C-pap intol     Orientation RESPIRATION BLADDER Height & Weight     Self, Time, Situation, Place  Normal Continent, External catheter Weight: 150 lb 4.8 oz (68.2 kg) Height:  5\' 4"  (162.6 cm)  BEHAVIORAL SYMPTOMS/MOOD NEUROLOGICAL BOWEL NUTRITION STATUS      Continent Diet(please see DC summary)  AMBULATORY STATUS COMMUNICATION OF NEEDS Skin   Limited Assist Verbally Normal                       Personal Care Assistance Level of Assistance  Bathing, Feeding, Dressing Bathing Assistance: Maximum assistance Feeding assistance: Limited assistance Dressing Assistance: Maximum assistance     Functional Limitations Info  Sight, Hearing, Speech Sight Info: Adequate Hearing Info: Adequate Speech Info: Adequate    SPECIAL CARE FACTORS FREQUENCY  PT (By licensed PT), OT (By licensed OT)     PT Frequency: 5x/week OT Frequency: 5x/week            Contractures Contractures Info: Not present    Additional Factors Info  Code  Status, Allergies, Psychotropic Code Status Info: Full Allergies Info: Avelox Moxifloxacin Hcl In Nacl, Pamelor Nortriptyline Hcl, Amoxicillin, Hydrocodone, Oxycodone, Penicillins, Sulfa Antibiotics Psychotropic Info: wellbutrin, clonidine, lexapro         Current Medications (04/27/2018):  This is the current hospital active  medication list Current Facility-Administered Medications  Medication Dose Route Frequency Provider Last Rate Last Dose  . acetaminophen (TYLENOL) tablet 650 mg  650 mg Oral Q6H PRN [name], MD   650 mg at 04/26/18 1756  . albuterol (PROVENTIL) (2.5 MG/3ML) 0.083% nebulizer solution 2.5 mg  2.5 mg Nebulization Q4H PRN [name], MD      . aspirin EC tablet 81 mg  81 mg Oral Daily [name], MD   81 mg at 04/27/18 0825  . atorvastatin (LIPITOR) tablet 40 mg  40 mg Oral q1800 [name], MD   40 mg at 04/26/18 1703  . buPROPion (WELLBUTRIN XL) 24 hr tablet 150 mg  150 mg Oral q morning - 10a [name], MD   150 mg at 04/27/18 0825  . diltiazem (CARDIZEM) tablet 60 mg  60 mg Oral Q6H Berton Mount I, MD   60 mg at 04/27/18 0825  . escitalopram (LEXAPRO) tablet 10 mg  10 mg Oral Daily [name], MD   10 mg at 04/27/18 0825  . famotidine (PEPCID) tablet 20 mg  20 mg Oral BID [name], MD   20 mg at 04/27/18 0827  . fluticasone (FLONASE) 50 MCG/ACT nasal spray 2 spray  2 spray Each Nare Daily [name], MD   2 spray at 04/23/18 0955  . hydrALAZINE (APRESOLINE) injection 5 mg  5 mg Intravenous Q2H PRN [name], MD      . levothyroxine (SYNTHROID, LEVOTHROID) tablet 150 mcg  150 mcg Oral QAC breakfast Berton Mount I, MD   150 mcg at 04/27/18 0630  . magnesium oxide (MAG-OX) tablet 400 mg  400 mg Oral Daily [name], MD   400 mg at 04/27/18 0827  . metoprolol tartrate (LOPRESSOR) tablet 25 mg  25 mg Oral BID Berton Mount I, MD   25 mg at 04/27/18 0827  . nicotine (NICODERM CQ - dosed in mg/24 hours) patch 21 mg  21 mg Transdermal Daily [name], MD   21 mg at 04/27/18 8916  . ondansetron (ZOFRAN) tablet 4 mg  4 mg Oral Q6H PRN [name], MD       Or  . ondansetron [facility]) injection 4 mg  4 mg Intravenous Q6H PRN [name], MD      . topiramate (TOPAMAX) tablet 50 mg  50 mg Oral BID [name], MD   50 mg at 04/26/18 2131  . vitamin B-12 (CYANOCOBALAMIN) tablet 1,000 mcg   1,000 mcg Oral Daily [name], MD   1,000 mcg at 04/27/18 9450  . Vitamin D (Ergocalciferol) (DRISDOL) capsule 50,000 Units  50,000 Units Oral Q7 days [name], MD   50,000 Units at 04/24/18 3888  . zolpidem (AMBIEN) tablet 5 mg  5 mg Oral QHS PRN [name], MD         Discharge Medications: Please see discharge summary for a list of discharge medications.  Relevant Imaging Results:  Relevant Lab Results:   Additional Information SSN: [id number]  Abigail Butts, LCSW

## 2018-04-27 NOTE — Clinical Social Work Placement (Signed)
   CLINICAL SOCIAL WORK PLACEMENT  NOTE  Date:  04/27/2018  Patient Details  Name: [name] MRN: [id number] Date of Birth: 01/25/1956  Clinical Social Work is seeking post-discharge placement for this patient at the Skilled  Nursing Facility level of care (*CSW will initial, date and re-position this form in  chart as items are completed):  Yes   Patient/family provided with [facility] Clinical Social Work Department's list of facilities offering this level of care within the geographic area requested by the patient (or if unable, by the patient's family).  Yes   Patient/family informed of their freedom to choose among providers that offer the needed level of care, that participate in Medicare, Medicaid or managed care program needed by the patient, have an available bed and are willing to accept the patient.  Yes   Patient/family informed of 's ownership interest in [facility] and [facility], as well as of the fact that they are under no obligation to receive care at these facilities.  PASRR submitted to EDS on 04/23/18     PASRR number received on 04/27/18     Existing PASRR number confirmed on       FL2 transmitted to all facilities in geographic area requested by pt/family on 04/23/18     FL2 transmitted to all facilities within larger geographic area on       Patient informed that his/her managed care company has contracts with or will negotiate with certain facilities, including the following:  [facility])         Patient/family informed of bed offers received.  Patient chooses bed at [facility])     Physician recommends and patient chooses bed at      Patient to be transferred to [facility]) on 04/27/18.  Patient to be transferred to facility by PTAR     Patient family notified on 04/27/18 of transfer.  Name of family  member notified:  [name], son     PHYSICIAN       Additional Comment:    _______________________________________________ Abigail Butts, LCSW 04/27/2018, 12:13 PM

## 2018-04-27 NOTE — Progress Notes (Addendum)
Patient will discharge to [facility] ([facility] Anticipated discharge date: 04/27/18 Family notified: [name], son Transportation by: [facility]  Nurse to call report to [phone]. Patient will go to room 130A at the facility.   CSW signing off.  Abigail Butts, LCSWA  Clinical Social Worker

## 2018-04-27 NOTE — Progress Notes (Signed)
Report given to Newton Pigg LPN. With [facility].

## 2018-04-28 ENCOUNTER — Other Ambulatory Visit: Payer: Self-pay | Admitting: Licensed Clinical Social Worker

## 2018-04-28 DIAGNOSIS — E119 Type 2 diabetes mellitus without complications: Secondary | ICD-10-CM | POA: Diagnosis not present

## 2018-04-28 DIAGNOSIS — J449 Chronic obstructive pulmonary disease, unspecified: Secondary | ICD-10-CM | POA: Diagnosis not present

## 2018-04-28 DIAGNOSIS — I10 Essential (primary) hypertension: Secondary | ICD-10-CM | POA: Diagnosis not present

## 2018-04-28 DIAGNOSIS — G4730 Sleep apnea, unspecified: Secondary | ICD-10-CM | POA: Diagnosis not present

## 2018-04-28 NOTE — Patient Outreach (Addendum)
[facility]) Care Management  04/28/2018  [name] 20-Oct-1955 [id number]  [facility] CSW arrived at [facility] to complete initial [facility] Consult after receiving new request to follow patient for safe discharge. [facility] CSW spoke with SNF social worker Roxanne and was informed that patient was only approved to stay at facility for two weeks. [facility] CSW informed Roxanne that patient's son mentioned to our staff that he was interested in patient applying for Medicaid. SNF social worker agreeable to follow up with son on this request. [facility] CSW met with patient successfully. HIPPA verifications received. [facility] CSW introduced self, reason for visit today and of [facility] CM Services. Patient agreeable to services but does not wish to sign consent form now. Patient gave verbal permission for [facility] CSW to contact her son in order to discuss her plan. [facility] CSW was able to reach patient's son by phone successfully. HIPPA verifications received also. Son reports that he does not feel comfortable with patient returning home with her two roommates as he feels this is an unsafe discharge plan. Son reports living in [place], [place] and that he already went to DSS and applied for Medicaid on patient's behalf. Son was unable to state which exact Medicaid he applied for( long term or community.) Patient reports not being interested in LTC placement at a nursing facility or assisted living facility but son feels that this is the best option for her due to her worsening health conditions. Patient has been to several SNF's over the past few months and son feels that patient is deny about her ability to care for herself now. However, patient reports that she is still able to do for herself. Patient reports not being able to afford a private pay caregiver once she returns home either. Patient reports having stable transportation thru [employer]. Patient and son agreed on a meeting between the two to come to an agreement about  patient's expected discharge plan post SNF. Meeting will take place after son gets off work. [facility] CSW will help support family once a decision has been made. [facility] CSW will continue to follow patient while at SNF.   Eula Fried, BSW, MSW, [name] Phone: [phone] Fax: [phone]

## 2018-05-04 ENCOUNTER — Other Ambulatory Visit: Payer: Self-pay | Admitting: *Deleted

## 2018-05-04 NOTE — Patient Outreach (Signed)
[facility]) Care Management  05/04/2018  KIRYN KOKOSZKA December 17, 1955 [id number]   Post Acute Care coordination phone call to [facility] in assigned social worker's absence for an update on patient's progress in rehab. This Child psychotherapist spoke with Roxanne who stated that patient is making minimal progress. She is unable to walk or sit up in a chair. Per Leandro Reasoner, she does not think patient will be ready for discharge and is requesting an extension on the LOG. Per Leandro Reasoner, patient's son has applied for Medicaid and once approved will cover the rehab stay retroactively.  Phone call to LCSWA Abigail Butts [facility] to request LOG extension.    Adriana Reams [facility] Care Management [phone]

## 2018-05-05 ENCOUNTER — Telehealth: Payer: Self-pay | Admitting: *Deleted

## 2018-05-05 ENCOUNTER — Other Ambulatory Visit: Payer: Self-pay | Admitting: Licensed Clinical Social Worker

## 2018-05-05 ENCOUNTER — Inpatient Hospital Stay (HOSPITAL_COMMUNITY)
Admission: EM | Admit: 2018-05-05 | Discharge: 2018-05-08 | DRG: 682 | Disposition: A | Discharge status: Skilled Nursing Facility | Payer: PPO | Source: Skilled Nursing Facility | Attending: Internal Medicine | Admitting: Internal Medicine

## 2018-05-05 ENCOUNTER — Encounter: Payer: Self-pay | Admitting: Adult Health

## 2018-05-05 ENCOUNTER — Emergency Department (HOSPITAL_COMMUNITY): Payer: PPO

## 2018-05-05 ENCOUNTER — Other Ambulatory Visit: Payer: Self-pay

## 2018-05-05 ENCOUNTER — Ambulatory Visit: Payer: PPO | Admitting: Adult Health

## 2018-05-05 ENCOUNTER — Encounter (HOSPITAL_COMMUNITY): Payer: Self-pay | Admitting: General Practice

## 2018-05-05 DIAGNOSIS — E872 Acidosis: Secondary | ICD-10-CM | POA: Diagnosis present

## 2018-05-05 DIAGNOSIS — I2510 Atherosclerotic heart disease of native coronary artery without angina pectoris: Secondary | ICD-10-CM | POA: Diagnosis not present

## 2018-05-05 DIAGNOSIS — G4730 Sleep apnea, unspecified: Secondary | ICD-10-CM | POA: Diagnosis present

## 2018-05-05 DIAGNOSIS — F17210 Nicotine dependence, cigarettes, uncomplicated: Secondary | ICD-10-CM | POA: Diagnosis not present

## 2018-05-05 DIAGNOSIS — R0989 Other specified symptoms and signs involving the circulatory and respiratory systems: Secondary | ICD-10-CM | POA: Diagnosis not present

## 2018-05-05 DIAGNOSIS — Z79890 Hormone replacement therapy (postmenopausal): Secondary | ICD-10-CM | POA: Diagnosis not present

## 2018-05-05 DIAGNOSIS — Z7189 Other specified counseling: Secondary | ICD-10-CM | POA: Diagnosis not present

## 2018-05-05 DIAGNOSIS — G9341 Metabolic encephalopathy: Secondary | ICD-10-CM | POA: Diagnosis present

## 2018-05-05 DIAGNOSIS — R6520 Severe sepsis without septic shock: Secondary | ICD-10-CM | POA: Diagnosis not present

## 2018-05-05 DIAGNOSIS — Z951 Presence of aortocoronary bypass graft: Secondary | ICD-10-CM | POA: Diagnosis not present

## 2018-05-05 DIAGNOSIS — F0390 Unspecified dementia without behavioral disturbance: Secondary | ICD-10-CM | POA: Diagnosis not present

## 2018-05-05 DIAGNOSIS — I481 Persistent atrial fibrillation: Secondary | ICD-10-CM | POA: Diagnosis not present

## 2018-05-05 DIAGNOSIS — F329 Major depressive disorder, single episode, unspecified: Secondary | ICD-10-CM | POA: Diagnosis present

## 2018-05-05 DIAGNOSIS — J449 Chronic obstructive pulmonary disease, unspecified: Secondary | ICD-10-CM | POA: Diagnosis not present

## 2018-05-05 DIAGNOSIS — A419 Sepsis, unspecified organism: Secondary | ICD-10-CM | POA: Diagnosis not present

## 2018-05-05 DIAGNOSIS — E785 Hyperlipidemia, unspecified: Secondary | ICD-10-CM | POA: Diagnosis present

## 2018-05-05 DIAGNOSIS — Z7901 Long term (current) use of anticoagulants: Secondary | ICD-10-CM

## 2018-05-05 DIAGNOSIS — N390 Urinary tract infection, site not specified: Secondary | ICD-10-CM | POA: Diagnosis present

## 2018-05-05 DIAGNOSIS — E162 Hypoglycemia, unspecified: Secondary | ICD-10-CM | POA: Diagnosis not present

## 2018-05-05 DIAGNOSIS — K219 Gastro-esophageal reflux disease without esophagitis: Secondary | ICD-10-CM | POA: Diagnosis not present

## 2018-05-05 DIAGNOSIS — F0150 Vascular dementia without behavioral disturbance: Secondary | ICD-10-CM | POA: Diagnosis present

## 2018-05-05 DIAGNOSIS — Z7951 Long term (current) use of inhaled steroids: Secondary | ICD-10-CM

## 2018-05-05 DIAGNOSIS — M2550 Pain in unspecified joint: Secondary | ICD-10-CM | POA: Diagnosis not present

## 2018-05-05 DIAGNOSIS — I482 Chronic atrial fibrillation, unspecified: Secondary | ICD-10-CM | POA: Diagnosis present

## 2018-05-05 DIAGNOSIS — I5032 Chronic diastolic (congestive) heart failure: Secondary | ICD-10-CM | POA: Diagnosis not present

## 2018-05-05 DIAGNOSIS — Z6825 Body mass index (BMI) 25.0-25.9, adult: Secondary | ICD-10-CM

## 2018-05-05 DIAGNOSIS — E860 Dehydration: Secondary | ICD-10-CM | POA: Diagnosis not present

## 2018-05-05 DIAGNOSIS — I10 Essential (primary) hypertension: Secondary | ICD-10-CM | POA: Diagnosis present

## 2018-05-05 DIAGNOSIS — Z8673 Personal history of transient ischemic attack (TIA), and cerebral infarction without residual deficits: Secondary | ICD-10-CM

## 2018-05-05 DIAGNOSIS — I429 Cardiomyopathy, unspecified: Secondary | ICD-10-CM | POA: Diagnosis not present

## 2018-05-05 DIAGNOSIS — E039 Hypothyroidism, unspecified: Secondary | ICD-10-CM | POA: Diagnosis not present

## 2018-05-05 DIAGNOSIS — I499 Cardiac arrhythmia, unspecified: Secondary | ICD-10-CM | POA: Diagnosis not present

## 2018-05-05 DIAGNOSIS — Z7982 Long term (current) use of aspirin: Secondary | ICD-10-CM | POA: Diagnosis not present

## 2018-05-05 DIAGNOSIS — Z79899 Other long term (current) drug therapy: Secondary | ICD-10-CM | POA: Diagnosis not present

## 2018-05-05 DIAGNOSIS — G9340 Encephalopathy, unspecified: Secondary | ICD-10-CM | POA: Diagnosis present

## 2018-05-05 DIAGNOSIS — N179 Acute kidney failure, unspecified: Principal | ICD-10-CM | POA: Diagnosis present

## 2018-05-05 DIAGNOSIS — T383X5A Adverse effect of insulin and oral hypoglycemic [antidiabetic] drugs, initial encounter: Secondary | ICD-10-CM | POA: Diagnosis present

## 2018-05-05 DIAGNOSIS — E440 Moderate protein-calorie malnutrition: Secondary | ICD-10-CM

## 2018-05-05 DIAGNOSIS — Z7401 Bed confinement status: Secondary | ICD-10-CM | POA: Diagnosis not present

## 2018-05-05 DIAGNOSIS — I110 Hypertensive heart disease with heart failure: Secondary | ICD-10-CM | POA: Diagnosis present

## 2018-05-05 DIAGNOSIS — Z7984 Long term (current) use of oral hypoglycemic drugs: Secondary | ICD-10-CM

## 2018-05-05 DIAGNOSIS — Z8249 Family history of ischemic heart disease and other diseases of the circulatory system: Secondary | ICD-10-CM | POA: Diagnosis not present

## 2018-05-05 DIAGNOSIS — I4891 Unspecified atrial fibrillation: Secondary | ICD-10-CM | POA: Diagnosis not present

## 2018-05-05 DIAGNOSIS — R000 Tachycardia, unspecified: Secondary | ICD-10-CM | POA: Diagnosis not present

## 2018-05-05 DIAGNOSIS — R5381 Other malaise: Secondary | ICD-10-CM | POA: Diagnosis not present

## 2018-05-05 DIAGNOSIS — E161 Other hypoglycemia: Secondary | ICD-10-CM | POA: Diagnosis not present

## 2018-05-05 DIAGNOSIS — Z515 Encounter for palliative care: Secondary | ICD-10-CM | POA: Diagnosis not present

## 2018-05-05 DIAGNOSIS — R4182 Altered mental status, unspecified: Secondary | ICD-10-CM | POA: Diagnosis not present

## 2018-05-05 LAB — URINALYSIS, ROUTINE W REFLEX MICROSCOPIC
Bilirubin Urine: NEGATIVE
Glucose, UA: NEGATIVE mg/dL
HGB URINE DIPSTICK: NEGATIVE
Ketones, ur: NEGATIVE mg/dL
NITRITE: NEGATIVE
PROTEIN: NEGATIVE mg/dL
SPECIFIC GRAVITY, URINE: 1.016 (ref 1.005–1.030)
pH: 5 (ref 5.0–8.0)

## 2018-05-05 LAB — COMPREHENSIVE METABOLIC PANEL
ALK PHOS: 63 U/L (ref 38–126)
ALT: 16 U/L (ref 0–44)
AST: 28 U/L (ref 15–41)
Albumin: 2.6 g/dL — ABNORMAL LOW (ref 3.5–5.0)
Anion gap: 12 (ref 5–15)
BUN: 53 mg/dL — AB (ref 6–20)
CALCIUM: 8.7 mg/dL — AB (ref 8.9–10.3)
CO2: 22 mmol/L (ref 22–32)
Chloride: 100 mmol/L (ref 98–111)
Creatinine, Ser: 2.83 mg/dL — ABNORMAL HIGH (ref 0.44–1.00)
GFR, EST AFRICAN AMERICAN: 20 mL/min — AB (ref >60)
GFR, EST NON AFRICAN AMERICAN: 17 mL/min — AB (ref >60)
Glucose, Bld: 96 mg/dL (ref 70–99)
Potassium: 3.3 mmol/L — ABNORMAL LOW (ref 3.5–5.1)
SODIUM: 134 mmol/L — AB (ref 135–145)
Total Bilirubin: 0.9 mg/dL (ref 0.3–1.2)
Total Protein: 5.9 g/dL — ABNORMAL LOW (ref 6.5–8.1)

## 2018-05-05 LAB — I-STAT TROPONIN, ED: Troponin i, poc: 0.05 ng/mL (ref 0.00–0.08)

## 2018-05-05 LAB — CBC WITH DIFFERENTIAL/PLATELET
Abs Immature Granulocytes: 0 10*3/uL (ref 0.0–0.1)
BASOS ABS: 0 10*3/uL (ref 0.0–0.1)
Basophils Relative: 1 %
EOS ABS: 0.1 10*3/uL (ref 0.0–0.7)
EOS PCT: 1 %
HEMATOCRIT: 38.6 % (ref 36.0–46.0)
Hemoglobin: 12.7 g/dL (ref 12.0–15.0)
Immature Granulocytes: 1 %
Lymphocytes Relative: 9 %
Lymphs Abs: 0.6 10*3/uL — ABNORMAL LOW (ref 0.7–4.0)
MCH: 32.9 pg (ref 26.0–34.0)
MCHC: 32.9 g/dL (ref 30.0–36.0)
MCV: 100 fL (ref 78.0–100.0)
Monocytes Absolute: 0.5 10*3/uL (ref 0.1–1.0)
Monocytes Relative: 8 %
Neutro Abs: 5.2 10*3/uL (ref 1.7–7.7)
Neutrophils Relative %: 80 %
Platelets: 240 10*3/uL (ref 150–400)
RBC: 3.86 MIL/uL — AB (ref 3.87–5.11)
RDW: 12 % (ref 11.5–15.5)
WBC: 6.3 10*3/uL (ref 4.0–10.5)

## 2018-05-05 LAB — CBG MONITORING, ED
GLUCOSE-CAPILLARY: 90 mg/dL (ref 70–99)
GLUCOSE-CAPILLARY: 102 mg/dL — AB (ref 70–99)
Glucose-Capillary: 150 mg/dL — ABNORMAL HIGH (ref 70–99)
Glucose-Capillary: 38 mg/dL — CL (ref 70–99)

## 2018-05-05 LAB — I-STAT CG4 LACTIC ACID, ED
Lactic Acid, Venous: 2.01 mmol/L (ref 0.5–1.9)
Lactic Acid, Venous: 1.21 mmol/L (ref 0.5–1.9)

## 2018-05-05 LAB — BRAIN NATRIURETIC PEPTIDE: B NATRIURETIC PEPTIDE 5: 103.4 pg/mL — AB (ref 0.0–100.0)

## 2018-05-05 MED ORDER — METOPROLOL TARTRATE 25 MG PO TABS
25.0000 mg | ORAL_TABLET | Freq: Two times a day (BID) | ORAL | Status: DC
  Administered 2018-05-06: 25 mg via ORAL
  Filled 2018-05-05: qty 1

## 2018-05-05 MED ORDER — ATORVASTATIN CALCIUM 20 MG PO TABS
40.0000 mg | ORAL_TABLET | Freq: Every day | ORAL | Status: DC
  Administered 2018-05-06 – 2018-05-08 (×3): 40 mg via ORAL
  Filled 2018-05-05 (×3): qty 2

## 2018-05-05 MED ORDER — ACETAMINOPHEN 650 MG RE SUPP: 650.0000 mg | Freq: Four times a day (QID) | RECTAL | Status: DC | PRN

## 2018-05-05 MED ORDER — MIRTAZAPINE 15 MG PO TABS: 15.0000 mg | ORAL_TABLET | Freq: Every day | ORAL | Status: DC

## 2018-05-05 MED ORDER — LEVOTHYROXINE SODIUM 200 MCG PO TABS
200.0000 ug | ORAL_TABLET | Freq: Every day | ORAL | Status: DC
  Administered 2018-05-06: 200 ug via ORAL
  Filled 2018-05-05: qty 2
  Filled 2018-05-05: qty 1

## 2018-05-05 MED ORDER — BUPROPION HCL ER (XL) 150 MG PO TB24
150.0000 mg | ORAL_TABLET | Freq: Every morning | ORAL | Status: DC
  Administered 2018-05-06: 150 mg via ORAL
  Filled 2018-05-05: qty 1

## 2018-05-05 MED ORDER — DEXTROSE 50 % IV SOLN
1.0000 | Freq: Once | INTRAVENOUS | Status: AC
  Administered 2018-05-05 – 2018-05-06 (×2): 50 mL via INTRAVENOUS
  Filled 2018-05-05: qty 50

## 2018-05-05 MED ORDER — DEXTROSE 10 % IV SOLN: INTRAVENOUS | Status: DC

## 2018-05-05 MED ORDER — SODIUM CHLORIDE 4 MEQ/ML IV SOLN
INTRAVENOUS | Status: DC
  Administered 2018-05-05 – 2018-05-06 (×2): via INTRAVENOUS
  Filled 2018-05-05 (×4): qty 1000

## 2018-05-05 MED ORDER — DOCUSATE SODIUM 100 MG PO CAPS
100.0000 mg | ORAL_CAPSULE | Freq: Two times a day (BID) | ORAL | Status: DC
  Administered 2018-05-05 – 2018-05-06 (×2): 100 mg via ORAL
  Filled 2018-05-05 (×2): qty 1

## 2018-05-05 MED ORDER — TOPIRAMATE 25 MG PO TABS
50.0000 mg | ORAL_TABLET | Freq: Two times a day (BID) | ORAL | Status: DC
  Administered 2018-05-06 (×2): 50 mg via ORAL
  Filled 2018-05-05 (×3): qty 2

## 2018-05-05 MED ORDER — FLUTICASONE PROPIONATE 50 MCG/ACT NA SUSP
2.0000 | Freq: Every day | NASAL | Status: DC
  Administered 2018-05-06: 2 via NASAL
  Filled 2018-05-05: qty 16

## 2018-05-05 MED ORDER — SODIUM CHLORIDE 0.9 % IV SOLN: 1.0000 g | INTRAVENOUS | Status: DC

## 2018-05-05 MED ORDER — ONDANSETRON HCL 4 MG PO TABS: 4.0000 mg | ORAL_TABLET | Freq: Four times a day (QID) | ORAL | Status: DC | PRN

## 2018-05-05 MED ORDER — DILTIAZEM HCL 60 MG PO TABS
60.0000 mg | ORAL_TABLET | Freq: Four times a day (QID) | ORAL | Status: DC
  Administered 2018-05-05 – 2018-05-06 (×5): 60 mg via ORAL
  Filled 2018-05-05 (×5): qty 1

## 2018-05-05 MED ORDER — ESCITALOPRAM OXALATE 10 MG PO TABS
10.0000 mg | ORAL_TABLET | Freq: Every day | ORAL | Status: DC
  Administered 2018-05-06: 10 mg via ORAL
  Filled 2018-05-05: qty 1

## 2018-05-05 MED ORDER — SODIUM CHLORIDE 0.9 % IV SOLN: 2.0000 g | Freq: Once | INTRAVENOUS | Status: DC

## 2018-05-05 MED ORDER — SODIUM CHLORIDE 0.9% FLUSH: 3.0000 mL | INTRAVENOUS | Status: DC | PRN

## 2018-05-05 MED ORDER — ENOXAPARIN SODIUM 30 MG/0.3ML ~~LOC~~ SOLN: 30.0000 mg | SUBCUTANEOUS | Status: DC

## 2018-05-05 MED ORDER — ACETAMINOPHEN 325 MG PO TABS
650.0000 mg | ORAL_TABLET | Freq: Four times a day (QID) | ORAL | Status: DC | PRN
  Administered 2018-05-05: 650 mg via ORAL
  Filled 2018-05-05: qty 2

## 2018-05-05 MED ORDER — LACTATED RINGERS IV SOLN
INTRAVENOUS | Status: DC
  Administered 2018-05-05 – 2018-05-06 (×2): via INTRAVENOUS

## 2018-05-05 MED ORDER — ALBUTEROL SULFATE (2.5 MG/3ML) 0.083% IN NEBU: 3.0000 mL | INHALATION_SOLUTION | RESPIRATORY_TRACT | Status: DC | PRN

## 2018-05-05 MED ORDER — ONDANSETRON HCL 4 MG/2ML IJ SOLN: 4.0000 mg | Freq: Four times a day (QID) | INTRAMUSCULAR | Status: DC | PRN

## 2018-05-05 MED ORDER — ASPIRIN EC 81 MG PO TBEC
81.0000 mg | DELAYED_RELEASE_TABLET | Freq: Every day | ORAL | Status: DC
  Administered 2018-05-06: 81 mg via ORAL
  Filled 2018-05-05: qty 1

## 2018-05-05 MED ORDER — FAMOTIDINE 20 MG PO TABS
20.0000 mg | ORAL_TABLET | Freq: Two times a day (BID) | ORAL | Status: DC
  Administered 2018-05-06: 20 mg via ORAL
  Filled 2018-05-05: qty 1

## 2018-05-05 MED ORDER — MAGNESIUM OXIDE 400 (241.3 MG) MG PO TABS
400.0000 mg | ORAL_TABLET | Freq: Every day | ORAL | Status: DC
  Administered 2018-05-06 – 2018-05-08 (×3): 400 mg via ORAL
  Filled 2018-05-05 (×3): qty 1

## 2018-05-05 MED ORDER — SODIUM CHLORIDE 0.9 % IV BOLUS
500.0000 mL | Freq: Once | INTRAVENOUS | Status: AC
  Administered 2018-05-05: 500 mL via INTRAVENOUS

## 2018-05-05 NOTE — Progress Notes (Signed)
New Admission Note:   Arrival Method: Bed  Mental Orientation: Alert  Telemetry: 29M 09  Assessment: Completed Skin: intact ingrown toe nail left great toe  IV: right for arm  Pain: 0/10  Tubes: Safety Measures: Safety Fall Prevention Plan has been given, discussed and signed Admission: Completed [facility] Orientation: Patient has been orientated to the room, unit and staff.  Family: none at the bedside   Orders have been reviewed and implemented. Will continue to monitor the patient. Call light has been placed within reach and bed alarm has been activated.   Kenji Mapel RN [facility] Renal Phone: [phone]

## 2018-05-05 NOTE — Progress Notes (Signed)
Pharmacy Antibiotic Note  [name] is a 62 y.o. female admitted on 05/05/2018 with AMS and hypoglycemia.  Pharmacy has been consulted for cefepime dosing for sepsis and UTI.  Patient has AKI.  WBC WNL, LA 2.01 > 1.21.  First dose of antibiotic already ordered.   Plan: Cefepime 1gm IV Q24H, start tomorrow Monitor renal fxn, clinical progress, micro data to determine LOT     No data recorded.  Recent Labs  Lab 05/05/18 1214 05/05/18 1235 05/05/18 1246 05/05/18 1436  WBC  --  6.3  --   --   CREATININE 2.83*  --   --   --   LATICACIDVEN  --   --  2.01* 1.21    Estimated Creatinine Clearance: 20.1 mL/min (A) (by C-G formula based on SCr of 2.83 mg/dL (H)).    Allergies  Allergen Reactions  . Avelox [Moxifloxacin Hcl In Nacl] Other (See Comments)    Mental breakdown.  [name] [Nortriptyline Hcl] Other (See Comments)    Made her want to hurt herself  . Amoxicillin Hives  . Hydrocodone Itching  . Oxycodone Itching  . Penicillins Hives       . Sulfa Antibiotics Other (See Comments)    unknown    Cefepime 8/27 >>  8/27 UCx -  8/27 BCx -    [name] D. Laney Potash, PharmD, BCPS, BCCCP 05/05/2018, 3:48 PM

## 2018-05-05 NOTE — ED Notes (Signed)
Pt given Coke per EDP

## 2018-05-05 NOTE — ED Triage Notes (Addendum)
Pt arrives from [facility] via [facility] after staff reported pt to be altered from baseline. Pt is alert but unable to accurately give year or month at time of triage. Initial EMS cbg was 42, 25g d10 given PTA. Pt denies SOB or CP at time of triage. Pt has hx of CVA with residual left sided weakness. Pt states she usually walks without assistive device. EMS reported hx of a-fib, presents today with a-fib RVR (140s).

## 2018-05-05 NOTE — H&P (Signed)
History and Physical    [name] XLK:[id number] DOB: [birth year] DOA: 05/05/2018  PCP: Madelin Headings, MD Patient coming from: [facility]  Chief Complaint: AMS  HPI: [name] is a 62 y.o. female with medical history significant of afib on Xarelto; dementia; hypothyroidism; HTN; CAD s/p CABG; homelessness; HLD; COPD; and systolic CHF presenting with AMS.  She was unaccompanied at the time of my evaluation.  She was oriented to person and place but not time and was otherwise unable to answer any questions.   ED Course:  AMS - glucose 42, but no h/o DM.  Given glucose, a bit better.  Sepsis criteria, likely UTI.  Also with AKI.  Glucose 38, given D50 and given D10.  Treated with abx for UTI.  BP improved to 120 systolic.  ?hospice.  Review of Systems: Unable to perform    Past Medical History:  Diagnosis Date  . Acute right MCA stroke (HCC) 11/07/10  . Atrial fibrillation (HCC)    a. s/p TEE-DCCV 02/2104; b. Xarelto started  . CAD (coronary artery disease)    a.  cath 09/2010: LAD stent patent, S-Int/dCFX ok, S-PDA ok, L-LAD atretic;  b. Lexiscan Myoview (02/2014):  no ischemia, EF 55%; c. 11/2014 Cath/PCI: LM nl, LAD 20pISR, LCX 80-53m, OM1 nl, RI 70p, RCA 40-67m, RPDA 95ost/95-37m (PTCA only w/ reduction to 50p/70m), 60d, L->LAD atretic, VG->RI->OM nl, VG->PDA 100p.  . Cardiomyopathy with EF 40% at TEE 02/17/14 (likely tachycardia mediated - Myoview 02/19/14 neg for ischemia with normal EF) 02/18/2014  . COPD (chronic obstructive pulmonary disease) (HCC)   . Depression   . Eczema   . GERD (gastroesophageal reflux disease)   . Headache   . HLD (hyperlipidemia)   . Homelessness 11/12/2011  . 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 Hx of CABG   . Hx of transesophageal echocardiography (TEE) for monitoring 11/2010   TEE 11/2010: EF 60-65%, BAE, trivial atrial septal shunt;  right heart cath  in 10/2010 with elevated R and L heart pressures and diuretic started  . Hypertension   . Hypothyroidism   . Persistent atrial fibrillation (HCC) 10/2014  . Pulmonary nodules    repeat CT due in 11/2011  . Sleep apnea    recent sleep study  in 04/2014 per chart review  shows no significant OSA    Past Surgical History:  Procedure Laterality Date  . bilateral knee surgery    . BLADDER SURGERY    . CARDIOVERSION N/A 02/17/2014   Procedure: CARDIOVERSION;  Surgeon: Pricilla Riffle, MD;  Location: [facility] ENDOSCOPY;  Service: Cardiovascular;  Laterality: N/A;  . CARDIOVERSION N/A 09/20/2016   Procedure: CARDIOVERSION;  Surgeon: Lars Masson, MD;  Location: [facility] ENDOSCOPY;  Service: Cardiovascular;  Laterality: N/A;  . cath 2012    . CHEST WALL RECONSTRUCTION    . CORONARY ARTERY BYPASS GRAFT    . debriment for infection in chest    . HERNIA REPAIR    . LEFT AND RIGHT HEART CATHETERIZATION WITH CORONARY ANGIOGRAM N/A 11/10/2014   Procedure: LEFT AND RIGHT HEART CATHETERIZATION WITH CORONARY ANGIOGRAM;  Surgeon: Marykay Lex, MD;  Location: [facility] CATH LAB;  Service: Cardiovascular;  Laterality: N/A;  . Left mastoidectomy    . TEE WITHOUT CARDIOVERSION N/A 02/17/2014   Procedure: TRANSESOPHAGEAL ECHOCARDIOGRAM (TEE);  Surgeon: Pricilla Riffle, MD;  Location: [facility]  ENDOSCOPY;  Service: Cardiovascular;  Laterality: N/A;    Social History   Socioeconomic History  . Marital status: Divorced    Spouse name: Not on file  . Number of children: 2  . Years of education: Not on file  . Highest education level: Not on file  Occupational History  . Occupation: DISABLE   Social Needs  . Financial resource strain: Not on file  . Food insecurity:    Worry: Not on file    Inability: Not on file  . Transportation needs:    Medical: Not on file    Non-medical: Not on file  Tobacco Use  . Smoking status: Current Every Day Smoker    Packs/day: 0.50    Years: 30.00    Pack years: 15.00    Types: Cigarettes     Last attempt to quit: 09/10/2014    Years since quitting: 3.6  . Smokeless tobacco: Never Used  Substance and Sexual Activity  . Alcohol use: No    Alcohol/week: 0.0 standard drinks  . Drug use: No  . Sexual activity: Not Currently  Lifestyle  . Physical activity:    Days per week: Not on file    Minutes per session: Not on file  . Stress: Not on file  Relationships  . Social connections:    Talks on phone: Not on file    Gets together: Not on file    Attends religious service: Not on file    Active member of club or organization: Not on file    Attends meetings of clubs or organizations: Not on file    Relationship status: Not on file  . Intimate partner violence:    Fear of current or ex partner: Not on file    Emotionally abused: Not on file    Physically abused: Not on file    Forced sexual activity: Not on file  Other Topics Concern  . Not on file  Social History Narrative   Divorced   After stroke was living with sister and mother after sister asked her to leave. Mom has since deceased    Difficulties over control.    Then moved back in for about 6 weeks.   . tranportaion difficult son  helping   Ex-smoker   Was living at friends  Sister and her had argument and she was told to leave .    She was living in a homeless shelter for the last 3 weeks [facility]    son had help with transportation and medication       Work status regular  before got sick on short-term disability now lost t insurance after the stroke and couldn't work was  denied ssi    2 times.  She is not eligible for Medicaid because she doesn't have dependent children.         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   Lives in  rented house 2 house mates males keep tp self has her own b  room  Doing well     Allergies  Allergen Reactions  . Avelox [Moxifloxacin Hcl In Nacl] Other (See Comments)    Mental breakdown.  Tina Keens [Nortriptyline Hcl] Other (See Comments)    Made her want to hurt  herself  . Amoxicillin Hives  . Hydrocodone Itching  . Oxycodone Itching  . Penicillins Hives       . Sulfa Antibiotics Other (See Comments)    unknown    Family History  Problem Relation Age of Onset  . COPD Mother   . Heart disease Mother   . Arthritis Mother        Rheumatoid and PMR  . Osteoporosis Mother        Mom fractured hip  . Diabetes type II Mother   . Depression Mother   . Heart attack Father   . Depression Father   . Hypertension Father   . Alcohol abuse Father   . Depression Sister   . Anxiety disorder Sister   . Drug abuse Sister   . Stroke Neg Hx     Prior to Admission medications   Medication Sig Start Date End Date Taking? Authorizing Provider  acetaminophen (TYLENOL) 325 MG tablet Take 650 mg by mouth every 6 (six) hours as needed for mild pain.    [provider]  aspirin EC 81 MG EC tablet Take 1 tablet (81 mg total) by mouth daily. 03/25/18   Layne Benton, NP  atorvastatin (LIPITOR) 40 MG tablet Take 1 tablet (40 mg total) by mouth daily at 6 PM. 03/24/18   Layne Benton, NP  buPROPion (WELLBUTRIN XL) 150 MG 24 hr tablet Take 1 tablet (150 mg total) by mouth every morning. 04/01/18   Osvaldo Shipper, MD  butalbital-acetaminophen-caffeine (FIORICET, ESGIC) [phone] MG tablet Take 1 tablet by mouth every 8 (eight) hours as needed for headache. 03/24/18   Layne Benton, NP  diltiazem (CARDIZEM) 60 MG tablet Take 1 tablet (60 mg total) by mouth every 6 (six) hours. 04/20/18   Alison Murray, MD  escitalopram (LEXAPRO) 10 MG tablet Take 1 tablet (10 mg total) by mouth daily. 10/29/17 10/29/18  Plovsky, Earvin Hansen, MD  fluticasone (FLONASE) 50 MCG/ACT nasal spray Place 2 sprays into both nostrils daily. 08/15/16   Panosh, Neta Mends, MD    levothyroxine (SYNTHROID, LEVOTHROID) 200 MCG tablet Take 1 tablet (200 mcg total) by mouth daily before breakfast. 03/25/18   Layne Benton, NP  magnesium oxide (MAG-OX) 400 MG tablet Take 1 tablet (400 mg total) by mouth daily. 09/17/16   Lars Masson, MD  metoprolol tartrate (LOPRESSOR) 25 MG tablet Take 1 tablet (25 mg total) by mouth 2 (two) times daily. 04/01/18   Osvaldo Shipper, MD  mirtazapine (REMERON) 15 MG tablet Take 15 mg by mouth at bedtime.    [provider]  PROAIR HFA 108 [phone] Base) MCG/ACT inhaler INHALE 2 PUFFS BY MOUTH EVERY 6 HOURS AS NEEDED FOR SHORTNESS OF BREATH 02/03/18   Leslye Peer, MD  ranitidine (ZANTAC) 150 MG tablet Take 1 tablet (150 mg total) by mouth 2 (two) times daily. 04/16/17   Panosh, Neta Mends, MD  topiramate (TOPAMAX) 50 MG tablet Take 1 tablet (50 mg total) by mouth 2 (two) times daily. 10/13/17   Micki Riley, MD  vitamin B-12 1000 MCG tablet Take 1 tablet (1,000 mcg total) by mouth daily. 03/25/18   Layne Benton, NP  Vitamin D, Ergocalciferol, (DRISDOL) 50000 units CAPS capsule Take 50,000 Units by mouth every 7 (seven) days. On Friday    [provider]    Physical Exam: Vitals:   05/05/18 1545 05/05/18 1600 05/05/18 1630 05/05/18 1642  BP: 121/82 103/72 91/68   Pulse:  Resp: (!) 22 18 18    SpO2:    95%     General:  Appears calm and comfortable and is NAD Eyes:  PERRL, EOMI, normal lids, iris ENT:  grossly normal hearing, lips & tongue, somewhat dry mm Neck:  no LAD, masses or thyromegaly Cardiovascular:  RRR, no m/r/g. No LE edema.  Respiratory:   CTA bilaterally with no wheezes/rales/rhonchi.  Normal respiratory effort. Abdomen:  soft, NT, ND, NABS Skin:  no rash or induration seen on limited exam Musculoskeletal:  Appears to have LE atrophy Psychiatric: blunted mood and affect, speech sparse but attempted to answer some questions, AOx2, mostly stares into space Neurologic: unable to assess    Radiological  Exams on Admission: Dg Chest Port 1 View  Result Date: 05/05/2018 CLINICAL DATA:  AMS; Altered from baseline. Pt is alert but unable to accurately give year or month at time of triage. I Pt has hx of CVA with residual left sided weakness. Pt states she usually walks without assistive device. EXAM: PORTABLE CHEST 1 VIEW COMPARISON:  04/15/2018 FINDINGS: Status post CABG. Heart is enlarged. There are mildly prominent interstitial markings, stable over prior studies and likely chronic. No discrete consolidations. No pleural effusions. IMPRESSION: Stable cardiomegaly and prominent interstitial markings. Electronically Signed   By: Norva Pavlov M.D.   On: 05/05/2018 12:51    EKG: Independently reviewed.  Afib with rate 117; nonspecific ST changes with no evidence of acute ischemia, likely rate related   Labs on Admission: I have personally reviewed the available labs and imaging studies at the time of the admission.  Pertinent labs:   BUN 52/Creatinine 2.83/GFR 20; 6/1.27/52 Albumin 2.6 BNP 103.4 Troponin 0.05 Lactate 2.01, 1.21 CBC essentially normal UA: small LE, many bacteria  Assessment/Plan Principal Problem:   Acute metabolic encephalopathy Active Problems:   Hypothyroidism   Chronic diastolic heart failure (HCC)   HTN (hypertension)   Atrial fibrillation, chronic (HCC)   Acute renal failure (ARF) (HCC)   Hypoglycemia without diagnosis of diabetes mellitus   Dementia   Acute metabolic encephalopathy -Patient with recent (d/c 8/19) admission for the same -At that time, presentation was thought to be multifactorial, related to dehydration and progressive vascular dementia -While initial thought was that today's episode was also related to UTI/sepsis, she does not have apparent fever or other SIRS criteria and has other causes for AMS including hypoglycemia and renal failure. -Elevated lactate is thought to be related to renal failure and has cleared. -Will not continue  antibiotics at this time, although blood and urine cultures are pending.  Acute renal failure -Baseline creatinine is 1.1-1.2.   -Today's creatinine is 2.83 and BUN is 53 on admission.  -Likely due to prerenal failure secondary to dehydration in the setting of poor PO intake related to dementia. -IVF with both D10 infusion and also LR -Follow up renal function by BMP -Avoid ACEI and NSAIDs  Hypoglycemia -Hypoglycemia is likely related to poor PO intake, as above -A1c was 5.1 on 7/13; she does not have known DM -She required D50 x 2 and was started on D10 infusion - will continue  Dementia -She appears to have early and rapidly progressive dementia, likely vascular in nature -It would be helpful to have a goals of care discussion with her family; will order palliative care consult -Previously seen by psychiatry (8/12) and was found to lack capacity; her depression was also thought to be inadequately treated.  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  Chronic diastolic heart failure -Appears dry -Will rehydrate as above and monitor for evidence of volume overload -Unable to tolerate ACE due to hypotension  Hypothyroidism -Check TSH -Continue Synthroid at current dose for now  HTN -Normotensive on metoprolol, diltiazem -Clonidine and lisinopril caused hypotension    DVT prophylaxis:  SCDs Code Status: Full - confirmed with SNF paperwork Family Communication: None present Disposition Plan:  Back to SNF once clinically improved Consults called:  Palliative care Admission status: Admit - It is my clinical opinion that admission to INPATIENT is reasonable and necessary because of the expectation that this  patient will require hospital care that crosses at least 2 midnights to treat this condition based on the medical complexity of the problems presented.  Given the aforementioned information, the predictability of an adverse outcome is felt to be significant.    Jonah Blue MD [facility]  If note is complete, please contact covering daytime or nighttime physician. [email or web address] Password [email or web address]  05/05/2018, 4:48 PM

## 2018-05-05 NOTE — Patient Outreach (Signed)
[facility]) Care Management  05/05/2018  [name] [birth year] [id number]  [facility] CSW arrived at [facility] to complete [facility] Consult. When [facility] CSW arrived in patient's room, patient did not respond to any questions when prompted and was unable to provide any information during session. SNF social wroker Leandro Reasoner reported that they contacted the attending physician who is on their way to evaluate patient as this is not her baseline. SNF social worker reports that patient may go to the ED today. [facility] CSW received notification later in the day that patient arrived at [facility] ED. [facility] CSW will monitor case closely and await for hospital discharge.   Dickie La, BSW, MSW, LCSW [facility] [email or web address] Phone: [phone] Fax: [phone]

## 2018-05-05 NOTE — ED Notes (Signed)
Pt put on Purewick after I&O cath at 14.18.

## 2018-05-05 NOTE — Progress Notes (Deleted)
[facility] [address] [place]. [place] [address] [phone]       OFFICE FOLLOW UP NOTE  [name] Date of Birth:  [birth year] Medical Record Number:  [id number]   Reason for Referral:  hospital stroke follow up  CHIEF COMPLAINT:  No chief complaint on file.   HPI: [name] is being seen today for initial visit in the office for IVH secondary to hypertension on 03/18/2018. History obtained from *** and chart review. Reviewed all radiology images and labs personally.  [name] a 62 y.o.femalewith history ofAF on Xarelto, HTN, CAD s/p CABG, HLD, prior stroke, and tobacco usepresenting with HA.found to have IVH secondary to HTN and Xarelto coagulopathy. Xarelto was reversed and stopped, plavix stopped. Once stable post hemorrhage, was started on low dose aspirin. Plan check CT in 3-4 weeks. If IVH resolving, will place on eliquis and stop aspirin. Neurology will arrange at time of followup. Therapy recommends SNF at time of d/c.    ROS:   14 system review of systems performed and negative with exception of ***  PMH:  Past Medical History:  Diagnosis Date  . Acute right MCA stroke (HCC) 11/07/10  . Atrial fibrillation (HCC)    a. s/p TEE-DCCV 02/2104; b. Xarelto started  . CAD (coronary artery disease)    a.  cath 09/2010: LAD stent patent, S-Int/dCFX ok, S-PDA ok, L-LAD atretic;  b. Lexiscan Myoview (02/2014):  no ischemia, EF 55%; c. 11/2014 Cath/PCI: LM nl, LAD 20pISR, LCX 80-31m, OM1 nl, RI 70p, RCA 40-69m, RPDA 95ost/95-83m (PTCA only w/ reduction to 50p/25m), 60d, L->LAD atretic, VG->RI->OM nl, VG->PDA 100p.  . Cardiomyopathy with EF 40% at TEE 02/17/14 (likely tachycardia mediated - Myoview 02/19/14 neg for ischemia with normal EF) 02/18/2014  . COPD (chronic obstructive pulmonary disease) (HCC)   . Depression   . Eczema   . GERD (gastroesophageal reflux disease)   . Headache   . HLD (hyperlipidemia)   . Homelessness 11/12/2011    . 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 Hx of CABG   . Hx of transesophageal echocardiography (TEE) for monitoring 11/2010   TEE 11/2010: EF 60-65%, BAE, trivial atrial septal shunt;  right heart cath in 10/2010 with elevated R and L heart pressures and diuretic started  . Hypertension   . Hypothyroidism   . Persistent atrial fibrillation (HCC) 10/2014  . Pulmonary nodules    repeat CT due in 11/2011  . Sleep apnea    recent sleep study  in 04/2014 per chart review  shows no significant OSA    PSH:  Past Surgical History:  Procedure Laterality Date  . bilateral knee surgery    . BLADDER SURGERY    . CARDIOVERSION N/A 02/17/2014   Procedure: CARDIOVERSION;  Surgeon: Pricilla Riffle, MD;  Location: [facility] ENDOSCOPY;  Service: Cardiovascular;  Laterality: N/A;  . CARDIOVERSION N/A 09/20/2016   Procedure: CARDIOVERSION;  Surgeon: Lars Masson, MD;  Location: [facility] ENDOSCOPY;  Service: Cardiovascular;  Laterality: N/A;  . cath 2012    . CHEST WALL RECONSTRUCTION    . CORONARY ARTERY BYPASS GRAFT    . debriment for infection in chest    . HERNIA REPAIR    . LEFT AND RIGHT HEART CATHETERIZATION WITH CORONARY ANGIOGRAM N/A 11/10/2014   Procedure: LEFT AND RIGHT HEART CATHETERIZATION WITH CORONARY ANGIOGRAM;  Surgeon: Marykay Lex, MD;  Location: [facility] CATH LAB;  Service: Cardiovascular;  Laterality: N/A;  . Left mastoidectomy    . TEE WITHOUT CARDIOVERSION N/A 02/17/2014   Procedure: TRANSESOPHAGEAL ECHOCARDIOGRAM (TEE);  Surgeon: Pricilla Riffle, MD;  Location: [facility] ENDOSCOPY;  Service: Cardiovascular;  Laterality: N/A;    Social History:  Social History   Socioeconomic History  . Marital status: Divorced    Spouse name: Not on file  . Number of children: 2  . Years of education: Not on file  . Highest education level: Not on file  Occupational History  . Occupation: DISABLE   Social Needs  . Financial  resource strain: Not on file  . Food insecurity:    Worry: Not on file    Inability: Not on file  . Transportation needs:    Medical: Not on file    Non-medical: Not on file  Tobacco Use  . Smoking status: Current Every Day Smoker    Packs/day: 0.50    Years: 30.00    Pack years: 15.00    Types: Cigarettes    Last attempt to quit: 09/10/2014    Years since quitting: 3.6  . Smokeless tobacco: Never Used  Substance and Sexual Activity  . Alcohol use: No    Alcohol/week: 0.0 standard drinks  . Drug use: No  . Sexual activity: Not Currently  Lifestyle  . Physical activity:    Days per week: Not on file    Minutes per session: Not on file  . Stress: Not on file  Relationships  . Social connections:    Talks on phone: Not on file    Gets together: Not on file    Attends religious service: Not on file    Active member of club or organization: Not on file    Attends meetings of clubs or organizations: Not on file    Relationship status: Not on file  . Intimate partner violence:    Fear of current or ex partner: Not on file    Emotionally abused: Not on file    Physically abused: Not on file    Forced sexual activity: Not on file  Other Topics Concern  . Not on file  Social History Narrative   Divorced   After stroke was living with sister and mother after sister asked her to leave. Mom has since deceased    Difficulties over control.    Then moved back in for about 6 weeks.   . tranportaion difficult son  helping   Ex-smoker   Was living at friends  Sister and her had argument and she was told to leave .    She was living in a homeless shelter for the last 3 weeks [facility]    son had help with transportation and medication       Work status regular  before got sick on short-term disability now lost t insurance after the stroke and couldn't work was  denied ssi    2 times.  She is not eligible for Medicaid because she doesn't have dependent children.         College  graduate ;psychology [employer] graduated May 2011   Has children   Now on medicare /medicaid disability  So she has some acess to health services    Has a drivers licence has stopped driving cause doesn't feel safe son takes her to get groceries .   Lives in  rented house 2 house mates males keep tp self  has her own b room  Doing well     Family History:  Family History  Problem Relation Age of Onset  . COPD Mother   . Heart disease Mother   . Arthritis Mother        Rheumatoid and PMR  . Osteoporosis Mother        Mom fractured hip  . Diabetes type II Mother   . Depression Mother   . Heart attack Father   . Depression Father   . Hypertension Father   . Alcohol abuse Father   . Depression Sister   . Anxiety disorder Sister   . Drug abuse Sister   . Stroke Neg Hx     Medications:   Current Outpatient Medications on File Prior to Visit  Medication Sig Dispense Refill  . acetaminophen (TYLENOL) 325 MG tablet Take 650 mg by mouth every 6 (six) hours as needed for mild pain.    [name] aspirin EC 81 MG EC tablet Take 1 tablet (81 mg total) by mouth daily.    [name] atorvastatin (LIPITOR) 40 MG tablet Take 1 tablet (40 mg total) by mouth daily at 6 PM.    . buPROPion (WELLBUTRIN XL) 150 MG 24 hr tablet Take 1 tablet (150 mg total) by mouth every morning.    . butalbital-acetaminophen-caffeine (FIORICET, ESGIC) 50-325-40 MG tablet Take 1 tablet by mouth every 8 (eight) hours as needed for headache. 14 tablet 0  . diltiazem (CARDIZEM) 60 MG tablet Take 1 tablet (60 mg total) by mouth every 6 (six) hours. 120 tablet 0  . escitalopram (LEXAPRO) 10 MG tablet Take 1 tablet (10 mg total) by mouth daily. 90 tablet 1  . fluticasone (FLONASE) 50 MCG/ACT nasal spray Place 2 sprays into both nostrils daily. 48 g 1  . levothyroxine (SYNTHROID, LEVOTHROID) 200 MCG tablet Take 1 tablet (200 mcg total) by mouth daily before breakfast.    . magnesium oxide (MAG-OX) 400 MG tablet Take 1 tablet (400 mg total) by  mouth daily. 180 tablet 1  . metoprolol tartrate (LOPRESSOR) 25 MG tablet Take 1 tablet (25 mg total) by mouth 2 (two) times daily.    . mirtazapine (REMERON) 15 MG tablet Take 15 mg by mouth at bedtime.    [name] PROAIR HFA 108 (90 Base) MCG/ACT inhaler INHALE 2 PUFFS BY MOUTH EVERY 6 HOURS AS NEEDED FOR SHORTNESS OF BREATH 1 Inhaler 0  . ranitidine (ZANTAC) 150 MG tablet Take 1 tablet (150 mg total) by mouth 2 (two) times daily. 60 tablet 1  . topiramate (TOPAMAX) 50 MG tablet Take 1 tablet (50 mg total) by mouth 2 (two) times daily. 180 tablet 1  . vitamin B-12 1000 MCG tablet Take 1 tablet (1,000 mcg total) by mouth daily.    . Vitamin D, Ergocalciferol, (DRISDOL) 50000 units CAPS capsule Take 50,000 Units by mouth every 7 (seven) days. On Friday     No current facility-administered medications on file prior to visit.     Allergies:   Allergies  Allergen Reactions  . Avelox [Moxifloxacin Hcl In Nacl] Other (See Comments)    Mental breakdown.  [name] [Nortriptyline Hcl] Other (See Comments)    Made her want to hurt herself  . Amoxicillin Hives  . Hydrocodone Itching  . Oxycodone Itching  . Penicillins Hives       . Sulfa Antibiotics Other (See Comments)    unknown     Physical Exam  There were no vitals filed for this visit. There is  no height or weight on file to calculate BMI. No exam data present  General: well developed, well nourished, seated, in no evident distress Head: head normocephalic and atraumatic.   Neck: supple with no carotid or supraclavicular bruits Cardiovascular: regular rate and rhythm, no murmurs Musculoskeletal: no deformity Skin:  no rash/petichiae Vascular:  Normal pulses all extremities  Neurologic Exam Mental Status: Awake and fully alert. Oriented to place and time. Recent and remote memory intact. Attention span, concentration and fund of knowledge appropriate. Mood and affect appropriate.  Cranial Nerves: Fundoscopic exam reveals sharp disc  margins. Pupils equal, briskly reactive to light. Extraocular movements full without nystagmus. Visual fields full to confrontation. Hearing intact. Facial sensation intact. Face, tongue, palate moves normally and symmetrically.  Motor: Normal bulk and tone. Normal strength in all tested extremity muscles. Sensory.: intact to touch , pinprick , position and vibratory sensation.  Coordination: Rapid alternating movements normal in all extremities. Finger-to-nose and heel-to-shin performed accurately bilaterally. Gait and Station: Arises from chair without difficulty. Stance is normal. Gait demonstrates normal stride length and balance . Able to heel, toe and tandem walk without difficulty.  Reflexes: 1+ and symmetric. Toes downgoing.    NIHSS  *** Modified Rankin  *** HAS-BLED *** CHA2DS2-VASc ***   Diagnostic Data (Labs, Imaging, Testing)  CT HEAD WO CONTRAST ***  MR BRAIN WO CONTRAST ***  CT ANGIO HEAD W OR WO CONTRAST CT ANGIO NECK W OR WO CONTRAST ***  MR MRA HEAD  MR MRA NECK ***  ECHOCARDIOGRAM ***    ASSESSMENT: [name] is a 62 y.o. year old female here with *** on *** secondary to ***. Vascular risk factors include ***.     PLAN: -Continue {anticoagulants:31417}  and ***  for secondary stroke prevention -F/u with PCP regarding your *** management -continue to monitor BP at home -advised to continue to stay active and maintain a healthy diet -Maintain strict control of hypertension with blood pressure goal below 130/90, diabetes with hemoglobin A1c goal below 6.5% and cholesterol with LDL cholesterol (bad cholesterol) goal below 70 mg/dL. I also advised the patient to eat a healthy diet with plenty of whole grains, cereals, fruits and vegetables, exercise regularly and maintain ideal body weight.  Follow up in *** or call earlier if needed   Greater than 50% of time during this 25 minute visit was spent on counseling,explanation of diagnosis of ***,  reviewing risk factor management of ***, planning of further management, discussion with patient and family and coordination of care    George Hugh, [facility]  [facility] [address] [place], [place] [address]  Phone [phone] Fax [phone] Note: This document was prepared with digital dictation and possible smart phrase technology. Any transcriptional errors that result from this process are unintentional.

## 2018-05-05 NOTE — ED Provider Notes (Signed)
[facility] EMERGENCY DEPARTMENT Provider Note   CSN: [id number] Arrival date & time: 05/05/18  1147   LEVEL 5 CAVEAT - DEMENTIA   History   Chief Complaint Chief Complaint  Patient presents with  . Altered Mental Status    HPI [name] is a 62 y.o. female.  HPI  62 year old female with a history of A. fib, right MCA stroke, COPD, cardiomyopathy/CHF and multiple other medical problems presents from the nursing facility via EMS with altered mental status.  EMS found her glucose to be 42 and she was lethargic.  They gave her D10 and her mental status has improved.  She is more awake now but is altered.  I do not know her exact baseline as there is no family or other personnel available at the bedside.  She denies any acute complaints.  When asked about pain she states her bottom hurts because of the bed.  Is noted to have a cough which she states she has had for "a long time".  Past Medical History:  Diagnosis Date  . Acute right MCA stroke (HCC) 11/07/10  . Atrial fibrillation (HCC)    a. s/p TEE-DCCV 02/2104; b. Xarelto started  . CAD (coronary artery disease)    a.  cath 09/2010: LAD stent patent, S-Int/dCFX ok, S-PDA ok, L-LAD atretic;  b. Lexiscan Myoview (02/2014):  no ischemia, EF 55%; c. 11/2014 Cath/PCI: LM nl, LAD 20pISR, LCX 80-28m, OM1 nl, RI 70p, RCA 40-27m, RPDA 95ost/95-87m (PTCA only w/ reduction to 50p/79m), 60d, L->LAD atretic, VG->RI->OM nl, VG->PDA 100p.  . Cardiomyopathy with EF 40% at TEE 02/17/14 (likely tachycardia mediated - Myoview 02/19/14 neg for ischemia with normal EF) 02/18/2014  . COPD (chronic obstructive pulmonary disease) (HCC)   . Depression   . Eczema   . GERD (gastroesophageal reflux disease)   . Headache   . HLD (hyperlipidemia)   . Homelessness 11/12/2011  . 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 Hx of CABG   . Hx of transesophageal  echocardiography (TEE) for monitoring 11/2010   TEE 11/2010: EF 60-65%, BAE, trivial atrial septal shunt;  right heart cath in 10/2010 with elevated R and L heart pressures and diuretic started  . Hypertension   . Hypothyroidism   . Persistent atrial fibrillation (HCC) 10/2014  . Pulmonary nodules    repeat CT due in 11/2011  . Sleep apnea    recent sleep study  in 04/2014 per chart review  shows no significant OSA    Patient Active Problem List   Diagnosis Date Noted  . Evaluation by psychiatric service required   . GERD (gastroesophageal reflux disease) 04/15/2018  . UTI (urinary tract infection) 04/15/2018  . Atrial fibrillation, chronic (HCC) 04/15/2018  . Depression 04/15/2018  . Fall 04/15/2018  . Tobacco abuse 04/15/2018  . Sepsis (HCC) 04/15/2018  . Acute metabolic encephalopathy 04/15/2018  . Hypertensive emergency 03/25/2018  . NSTEMI (non-ST elevated myocardial infarction) (HCC)   . Troponin level elevated   . IVH (intraventricular hemorrhage) (HCC) 03/19/2018  . 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 Long term current use of amiodarone 03/27/2017  . Severe recurrent major depression without psychotic features (HCC) 11/19/2016    Class: Chronic  . Pre-procedure lab exam 09/13/2016  . Generalized headaches 07/04/2016  . Protein-calorie malnutrition, severe (HCC) 04/03/2015  . Chronic diastolic CHF (congestive heart failure), NYHA  class 2 (HCC) 01/24/2015  . Demand ischemia secondary to AF with RVR 12/13/2014  . CKD (chronic kidney disease), stage III (HCC) 11/11/2014  . Hypokalemia 05/22/2014  . Mood disorder (HCC) 03/20/2014  . HTN (hypertension) 03/20/2014  . Anemia, unspecified 03/20/2014  . Smoker 03/20/2014  . Atrial fibrillation with RVR (HCC) 03/03/2014  . AKI (acute kidney injury) (HCC) 03/03/2014  . Hypotension 03/03/2014  . Pulmonary hypertension (HCC) 03/03/2014  . COPD (chronic obstructive pulmonary disease) (HCC) 03/03/2014  . H/O: CVA (cerebrovascular  accident)   . Long-term (current) use of anticoagulants   . Cardiomyopathy-h/o tachycardia mediated-EF 65% per echo March 2016 02/18/2014  . Hyperlipidemia 02/18/2014  . CAD '07, LAD PCI 2012, SVG-PDA PTCA 11/10/14 02/16/2014  . Chronic diastolic heart failure (HCC)   . Pulmonary nodules   . Hypothyroidism   . OSA- C-pap intol     Past Surgical History:  Procedure Laterality Date  . bilateral knee surgery    . BLADDER SURGERY    . CARDIOVERSION N/A 02/17/2014   Procedure: CARDIOVERSION;  Surgeon: Pricilla Riffle, MD;  Location: [facility] ENDOSCOPY;  Service: Cardiovascular;  Laterality: N/A;  . CARDIOVERSION N/A 09/20/2016   Procedure: CARDIOVERSION;  Surgeon: Lars Masson, MD;  Location: [facility] ENDOSCOPY;  Service: Cardiovascular;  Laterality: N/A;  . cath 2012    . CHEST WALL RECONSTRUCTION    . CORONARY ARTERY BYPASS GRAFT    . debriment for infection in chest    . HERNIA REPAIR    . LEFT AND RIGHT HEART CATHETERIZATION WITH CORONARY ANGIOGRAM N/A 11/10/2014   Procedure: LEFT AND RIGHT HEART CATHETERIZATION WITH CORONARY ANGIOGRAM;  Surgeon: Marykay Lex, MD;  Location: [facility] CATH LAB;  Service: Cardiovascular;  Laterality: N/A;  . Left mastoidectomy    . TEE WITHOUT CARDIOVERSION N/A 02/17/2014   Procedure: TRANSESOPHAGEAL ECHOCARDIOGRAM (TEE);  Surgeon: Pricilla Riffle, MD;  Location: [facility] ENDOSCOPY;  Service: Cardiovascular;  Laterality: N/A;     OB History   None      Home Medications    Prior to Admission medications   Medication Sig Start Date End Date Taking? Authorizing Provider  acetaminophen (TYLENOL) 325 MG tablet Take 650 mg by mouth every 6 (six) hours as needed for mild pain.    [provider]  aspirin EC 81 MG EC tablet Take 1 tablet (81 mg total) by mouth daily. 03/25/18   Layne Benton, NP  atorvastatin (LIPITOR) 40 MG tablet Take 1 tablet (40 mg total) by mouth daily at 6 PM. 03/24/18   Layne Benton, NP  buPROPion (WELLBUTRIN XL) 150 MG 24 hr tablet Take 1  tablet (150 mg total) by mouth every morning. 04/01/18   Osvaldo Shipper, MD  butalbital-acetaminophen-caffeine (FIORICET, ESGIC) [phone] MG tablet Take 1 tablet by mouth every 8 (eight) hours as needed for headache. 03/24/18   Layne Benton, NP  diltiazem (CARDIZEM) 60 MG tablet Take 1 tablet (60 mg total) by mouth every 6 (six) hours. 04/20/18   Alison Murray, MD  escitalopram (LEXAPRO) 10 MG tablet Take 1 tablet (10 mg total) by mouth daily. 10/29/17 10/29/18  Plovsky, Earvin Hansen, MD  fluticasone (FLONASE) 50 MCG/ACT nasal spray Place 2 sprays into both nostrils daily. 08/15/16   Panosh, Neta Mends, MD  levothyroxine (SYNTHROID, LEVOTHROID) 200 MCG tablet Take 1 tablet (200 mcg total) by mouth daily before breakfast. 03/25/18   Layne Benton, NP  magnesium oxide (MAG-OX) 400 MG tablet Take 1 tablet (400 mg total) by mouth  daily. 09/17/16   Lars Masson, MD  metoprolol tartrate (LOPRESSOR) 25 MG tablet Take 1 tablet (25 mg total) by mouth 2 (two) times daily. 04/01/18   Osvaldo Shipper, MD  mirtazapine (REMERON) 15 MG tablet Take 15 mg by mouth at bedtime.    [provider]  PROAIR HFA 108 [phone] Base) MCG/ACT inhaler INHALE 2 PUFFS BY MOUTH EVERY 6 HOURS AS NEEDED FOR SHORTNESS OF BREATH 02/03/18   Leslye Peer, MD  ranitidine (ZANTAC) 150 MG tablet Take 1 tablet (150 mg total) by mouth 2 (two) times daily. 04/16/17   Panosh, Neta Mends, MD  topiramate (TOPAMAX) 50 MG tablet Take 1 tablet (50 mg total) by mouth 2 (two) times daily. 10/13/17   Micki Riley, MD  vitamin B-12 1000 MCG tablet Take 1 tablet (1,000 mcg total) by mouth daily. 03/25/18   Layne Benton, NP  Vitamin D, Ergocalciferol, (DRISDOL) 50000 units CAPS capsule Take 50,000 Units by mouth every 7 (seven) days. On Friday    [provider]    Family History Family History  Problem Relation Age of Onset  . COPD Mother   . Heart disease Mother   . Arthritis Mother        Rheumatoid and PMR  . Osteoporosis Mother         Mom fractured hip  . Diabetes type II Mother   . Depression Mother   . Heart attack Father   . Depression Father   . Hypertension Father   . Alcohol abuse Father   . Depression Sister   . Anxiety disorder Sister   . Drug abuse Sister   . Stroke Neg Hx     Social History Social History   Tobacco Use  . Smoking status: Current Every Day Smoker    Packs/day: 0.50    Years: 30.00    Pack years: 15.00    Types: Cigarettes    Last attempt to quit: 09/10/2014    Years since quitting: 3.6  . Smokeless tobacco: Never Used  Substance Use Topics  . Alcohol use: No    Alcohol/week: 0.0 standard drinks  . Drug use: No     Allergies   Avelox [moxifloxacin hcl in nacl]; Pamelor [nortriptyline hcl]; Amoxicillin; Hydrocodone; Oxycodone; Penicillins; and Sulfa antibiotics   Review of Systems Review of Systems  Unable to perform ROS: Dementia     Physical Exam Updated Vital Signs BP 121/89   Pulse (!) 32   Resp (!) 28   SpO2 93%   Physical Exam  Constitutional: She appears well-developed and well-nourished.  HENT:  Head: Normocephalic and atraumatic.  Right Ear: External ear normal.  Left Ear: External ear normal.  Nose: Nose normal.  Eyes: Right eye exhibits no discharge. Left eye exhibits no discharge.  Cardiovascular: Normal heart sounds. An irregular rhythm present. Tachycardia present.  Pulmonary/Chest: Effort normal.  Intermittent weak cough. Coarse breath sounds   Abdominal: Soft. There is no tenderness.  Musculoskeletal: She exhibits no edema.  Neurological: She is alert.  Awake, alert, disoriented to time and place  Skin: Skin is warm and dry.  Nursing note and vitals reviewed.    ED Treatments / Results  Labs (all labs ordered are listed, but only abnormal results are displayed) Labs Reviewed  COMPREHENSIVE METABOLIC PANEL - Abnormal; Notable for the following components:      Result Value   Sodium 134 (*)    Potassium 3.3 (*)    BUN 53 (*)  Creatinine, Ser 2.83 (*)    Calcium 8.7 (*)    Total Protein 5.9 (*)    Albumin 2.6 (*)    GFR calc non Af Amer 17 (*)    GFR calc Af Amer 20 (*)    All other components within normal limits  CBC WITH DIFFERENTIAL/PLATELET - Abnormal; Notable for the following components:   RBC 3.86 (*)    Lymphs Abs 0.6 (*)    All other components within normal limits  URINALYSIS, ROUTINE W REFLEX MICROSCOPIC - Abnormal; Notable for the following components:   APPearance HAZY (*)    Leukocytes, UA SMALL (*)    Bacteria, UA MANY (*)    All other components within normal limits  BRAIN NATRIURETIC PEPTIDE - Abnormal; Notable for the following components:   B Natriuretic Peptide 103.4 (*)    All other components within normal limits  CBG MONITORING, ED - Abnormal; Notable for the following components:   Glucose-Capillary 150 (*)    All other components within normal limits  CBG MONITORING, ED - Abnormal; Notable for the following components:   Glucose-Capillary 38 (*)    All other components within normal limits  I-STAT CG4 LACTIC ACID, ED - Abnormal; Notable for the following components:   Lactic Acid, Venous 2.01 (*)    All other components within normal limits  CULTURE, BLOOD (ROUTINE X 2)  CULTURE, BLOOD (ROUTINE X 2)  URINE CULTURE  I-STAT TROPONIN, ED  CBG MONITORING, ED  CBG MONITORING, ED  I-STAT CG4 LACTIC ACID, ED  CBG MONITORING, ED    EKG EKG Interpretation  Date/Time:  Tuesday May 05 2018 11:51:33 EDT Ventricular Rate:  117 PR Interval:    QRS Duration: 109 QT Interval:  313 QTC Calculation: 437 R Axis:   4 Text Interpretation:  Atrial fibrillation with RVR Ventricular premature complex RSR' in V1 or V2, right VCD or RVH LVH with secondary repolarization abnormality rate is faster compared to Apr 14 2018 Confirmed by Pricilla Loveless [phone]) on 05/05/2018 12:37:18 PM   Radiology Dg Chest Port 1 View  Result Date: 05/05/2018 CLINICAL DATA:  AMS; Altered from baseline. Pt  is alert but unable to accurately give year or month at time of triage. I Pt has hx of CVA with residual left sided weakness. Pt states she usually walks without assistive device. EXAM: PORTABLE CHEST 1 VIEW COMPARISON:  04/15/2018 FINDINGS: Status post CABG. Heart is enlarged. There are mildly prominent interstitial markings, stable over prior studies and likely chronic. No discrete consolidations. No pleural effusions. IMPRESSION: Stable cardiomegaly and prominent interstitial markings. Electronically Signed   By: Norva Pavlov M.D.   On: 05/05/2018 12:51    Procedures .Critical Care Performed by: Pricilla Loveless, MD Authorized by: Pricilla Loveless, MD   Critical care provider statement:    Critical care time (minutes):  35   Critical care time was exclusive of:  Separately billable procedures and treating other patients   Critical care was necessary to treat or prevent imminent or life-threatening deterioration of the following conditions:  Endocrine crisis, renal failure and sepsis   Critical care was time spent personally by me on the following activities:  Development of treatment plan with patient or surrogate, discussions with consultants, evaluation of patient's response to treatment, examination of patient, ordering and performing treatments and interventions, ordering and review of laboratory studies, ordering and review of radiographic studies, pulse oximetry, re-evaluation of patient's condition and review of old charts   (including critical care time)  Medications  Ordered in ED Medications  sodium chloride flush (NS) 0.9 % injection 3 mL (has no administration in time range)  dextrose 10 % 1,000 mL with sodium chloride 0.45 %, potassium chloride 40 mEq/L infusion ( Intravenous New Bag/Given 05/05/18 1526)  ceFEPIme (MAXIPIME) 2 g in sodium chloride 0.9 % 100 mL IVPB (has no administration in time range)  ceFEPIme (MAXIPIME) 1 g in sodium chloride 0.9 % 100 mL IVPB (has no  administration in time range)  sodium chloride 0.9 % bolus 500 mL (0 mLs Intravenous Stopped 05/05/18 1521)  dextrose 50 % solution 50 mL (50 mLs Intravenous Given 05/05/18 1433)     Initial Impression / Assessment and Plan / ED Course  I have reviewed the triage vital signs and the nursing notes.  Pertinent labs & imaging results that were available during my care of the patient were reviewed by me and considered in my medical decision making (see chart for details).     Patient presents with tachycardia and borderline blood pressure with systolic BP in the 90s.  She is awake and alert but confused which I think is her baseline based on previous notes.  Initial lethargy probably from hypoglycemia.  However it is very difficult to get patient to eat or drink anything here and she did develop recurrent hypoglycemia requiring D50.  I then placed her on a D10 drip.  She is noted to have renal failure with mildly elevated lactate as well.  No fever and no clear source of infection until urinalysis has returned and shows likely UTI.  Code sepsis called.  She does not appear to have shock otherwise and appears to have severe sepsis.  Given fluids and antibiotics.  She will need admission to the hospitalist service.  Final Clinical Impressions(s) / ED Diagnoses   Final diagnoses:  Hypoglycemia  Acute kidney injury (HCC)  Severe sepsis (HCC)  Acute UTI    ED Discharge Orders    None       Pricilla Loveless, MD 05/05/18 [phone]

## 2018-05-05 NOTE — Telephone Encounter (Signed)
Pt. noshowed f/u appt. with Shanda Bumps today/fim

## 2018-05-06 ENCOUNTER — Encounter (HOSPITAL_COMMUNITY): Payer: Self-pay | Admitting: *Deleted

## 2018-05-06 DIAGNOSIS — Z515 Encounter for palliative care: Secondary | ICD-10-CM

## 2018-05-06 DIAGNOSIS — Z7189 Other specified counseling: Secondary | ICD-10-CM

## 2018-05-06 DIAGNOSIS — G9341 Metabolic encephalopathy: Secondary | ICD-10-CM

## 2018-05-06 DIAGNOSIS — N390 Urinary tract infection, site not specified: Secondary | ICD-10-CM

## 2018-05-06 LAB — CBC
HCT: 36.6 % (ref 36.0–46.0)
HEMOGLOBIN: 11.7 g/dL — AB (ref 12.0–15.0)
MCH: 32.8 pg (ref 26.0–34.0)
MCHC: 32 g/dL (ref 30.0–36.0)
MCV: 102.5 fL — ABNORMAL HIGH (ref 78.0–100.0)
PLATELETS: 202 10*3/uL (ref 150–400)
RBC: 3.57 MIL/uL — AB (ref 3.87–5.11)
RDW: 11.9 % (ref 11.5–15.5)
WBC: 6.2 10*3/uL (ref 4.0–10.5)

## 2018-05-06 LAB — BLOOD CULTURE ID PANEL (REFLEXED)
ACINETOBACTER BAUMANNII: NOT DETECTED
CANDIDA KRUSEI: NOT DETECTED
Candida albicans: NOT DETECTED
Candida glabrata: NOT DETECTED
Candida parapsilosis: NOT DETECTED
Candida tropicalis: NOT DETECTED
ENTEROCOCCUS SPECIES: NOT DETECTED
ESCHERICHIA COLI: NOT DETECTED
Enterobacter cloacae complex: NOT DETECTED
Enterobacteriaceae species: NOT DETECTED
Haemophilus influenzae: NOT DETECTED
Klebsiella oxytoca: NOT DETECTED
Klebsiella pneumoniae: NOT DETECTED
LISTERIA MONOCYTOGENES: NOT DETECTED
METHICILLIN RESISTANCE: DETECTED — AB
NEISSERIA MENINGITIDIS: NOT DETECTED
PROTEUS SPECIES: NOT DETECTED
PSEUDOMONAS AERUGINOSA: NOT DETECTED
SERRATIA MARCESCENS: NOT DETECTED
STAPHYLOCOCCUS AUREUS BCID: NOT DETECTED
STREPTOCOCCUS AGALACTIAE: NOT DETECTED
STREPTOCOCCUS SPECIES: NOT DETECTED
Staphylococcus species: DETECTED — AB
Streptococcus pneumoniae: NOT DETECTED
Streptococcus pyogenes: NOT DETECTED

## 2018-05-06 LAB — BASIC METABOLIC PANEL
ANION GAP: 10 (ref 5–15)
BUN: 39 mg/dL — ABNORMAL HIGH (ref 6–20)
CALCIUM: 8.3 mg/dL — AB (ref 8.9–10.3)
CO2: 19 mmol/L — ABNORMAL LOW (ref 22–32)
CREATININE: 1.88 mg/dL — AB (ref 0.44–1.00)
Chloride: 106 mmol/L (ref 98–111)
GFR calc Af Amer: 32 mL/min — ABNORMAL LOW (ref >60)
GFR calc non Af Amer: 28 mL/min — ABNORMAL LOW (ref >60)
GLUCOSE: 117 mg/dL — AB (ref 70–99)
Potassium: 3.8 mmol/L (ref 3.5–5.1)
Sodium: 135 mmol/L (ref 135–145)

## 2018-05-06 LAB — GLUCOSE, CAPILLARY
GLUCOSE-CAPILLARY: 71 mg/dL (ref 70–99)
GLUCOSE-CAPILLARY: 160 mg/dL — AB (ref 70–99)
Glucose-Capillary: 214 mg/dL — ABNORMAL HIGH (ref 70–99)
Glucose-Capillary: 34 mg/dL — CL (ref 70–99)
Glucose-Capillary: 57 mg/dL — ABNORMAL LOW (ref 70–99)
Glucose-Capillary: 73 mg/dL (ref 70–99)
Glucose-Capillary: 79 mg/dL (ref 70–99)
Glucose-Capillary: <10 mg/dL — CL (ref 70–99)

## 2018-05-06 LAB — CORTISOL: Cortisol, Plasma: 10.4 ug/dL

## 2018-05-06 LAB — URINE CULTURE: Culture: NO GROWTH

## 2018-05-06 LAB — MRSA PCR SCREENING: MRSA BY PCR: NEGATIVE

## 2018-05-06 MED ORDER — DEXTROSE-NACL 5-0.9 % IV SOLN
INTRAVENOUS | Status: DC
  Administered 2018-05-06: 12:00:00 via INTRAVENOUS

## 2018-05-06 MED ORDER — DEXTROSE 10 % IV SOLN
INTRAVENOUS | Status: DC
  Administered 2018-05-06 – 2018-05-07 (×3): via INTRAVENOUS

## 2018-05-06 MED ORDER — DEXTROSE 50 % IV SOLN
INTRAVENOUS | Status: AC
  Administered 2018-05-06: 50 mL via INTRAVENOUS
  Filled 2018-05-06: qty 50

## 2018-05-06 MED ORDER — METHYLPREDNISOLONE SODIUM SUCC 125 MG IJ SOLR
125.0000 mg | Freq: Once | INTRAMUSCULAR | Status: AC
  Administered 2018-05-06: 125 mg via INTRAVENOUS
  Filled 2018-05-06: qty 2

## 2018-05-06 NOTE — Progress Notes (Signed)
CRITICAL VALUE ALERT  Critical Value: CBG 34  Date & Time Notied:  1420  Provider Notified: MD Adhikara  Recheck CBG is now  27  Patient ate at 16:18pm CBG was 71 CBG at 1826pm is  79 . Will continue to monitor.  Orders Received/Actions taken: see orders

## 2018-05-06 NOTE — Consult Note (Addendum)
   [facility] CM Inpatient Consult   05/06/2018  [name] [birth year] [id number]   Patient recently active and assessed for re-admission in the [facility]. Patient in the [employer] plan and from a skilled facility stay at [facility] facility.  Patient admitted with Altered Mental Status. Met with the patient at the bedside.  No family in the room.  Patient alert to self and place.  She states she has two sons.  Patient slow to respond to questions. Will continue to follow for progress and disposition.  Went by to speak with inpatient social worker but not available.  Will follow.  For questions contact:   Natividad Brood, RN BSN [facility] Liaison  [phone] business mobile phone Toll free office [phone]

## 2018-05-06 NOTE — Progress Notes (Signed)
PT Cancellation Note  Patient Details Name: [name] MRN: [id number] DOB: [birth year]   Cancelled Treatment:    Reason Eval/Treat Not Completed: Medical issues which prohibited therapy Pt with low blood sugar and RN requesting to hold PT. Will follow up as pt medically appropriate.   Gladys Damme, PT, DPT  Acute Rehabilitation Services  Pager: [phone]  [facility] Prom 05/06/2018, 2:54 PM

## 2018-05-06 NOTE — Progress Notes (Signed)
Palliative care progress note  Palliative consult received. Met briefly with [name].    She does not possess insight into her conditions to make her own decisions.    Will call to arrange time for meeting with son.  Full consult to follow.  Micheline Rough, MD [place] [facility] [phone]

## 2018-05-06 NOTE — Progress Notes (Signed)
PHARMACY - PHYSICIAN COMMUNICATION CRITICAL VALUE ALERT - BLOOD CULTURE IDENTIFICATION (BCID)  [name] is an 62 y.o. female who presented to [facility] on 05/05/2018 with a chief complaint of AMS  Assessment:   62 yo F presents with AMS. Started on abx for possible UTI/sepsis. Blood cx 1/4 with staph species. Probable contaminant. Abx have been stopped.  Name of physician (or Provider) Contacted: A. Adhikari  Current antibiotics: None  Changes to prescribed antibiotics recommended:  No change needed  Results for orders placed or performed during the hospital encounter of 05/05/18  Blood Culture ID Panel (Reflexed) (Collected: 05/05/2018  1:13 PM)  Result Value Ref Range   Enterococcus species NOT DETECTED NOT DETECTED   Listeria monocytogenes NOT DETECTED NOT DETECTED   Staphylococcus species DETECTED (A) NOT DETECTED   Staphylococcus aureus NOT DETECTED NOT DETECTED   Methicillin resistance DETECTED (A) NOT DETECTED   Streptococcus species NOT DETECTED NOT DETECTED   Streptococcus agalactiae NOT DETECTED NOT DETECTED   Streptococcus pneumoniae NOT DETECTED NOT DETECTED   Streptococcus pyogenes NOT DETECTED NOT DETECTED   Acinetobacter baumannii NOT DETECTED NOT DETECTED   Enterobacteriaceae species NOT DETECTED NOT DETECTED   Enterobacter cloacae complex NOT DETECTED NOT DETECTED   Escherichia coli NOT DETECTED NOT DETECTED   Klebsiella oxytoca NOT DETECTED NOT DETECTED   Klebsiella pneumoniae NOT DETECTED NOT DETECTED   Proteus species NOT DETECTED NOT DETECTED   Serratia marcescens NOT DETECTED NOT DETECTED   Haemophilus influenzae NOT DETECTED NOT DETECTED   Neisseria meningitidis NOT DETECTED NOT DETECTED   Pseudomonas aeruginosa NOT DETECTED NOT DETECTED   Candida albicans NOT DETECTED NOT DETECTED   Candida glabrata NOT DETECTED NOT DETECTED   Candida krusei NOT DETECTED NOT DETECTED   Candida parapsilosis NOT DETECTED NOT DETECTED   Candida tropicalis NOT  DETECTED NOT DETECTED    [name] 05/06/2018  12:13 PM

## 2018-05-06 NOTE — [place] FL2 (Signed)
[place] MEDICAID FL2 LEVEL OF CARE SCREENING TOOL     IDENTIFICATION  Patient Name: [name] Birthdate: [birth year] Sex: female Admission Date (Current Location): 05/05/2018  [facility] and [place] Number:  Producer, television/film/video and Address:  [facility], [address], [place], [place] [address]      Provider Number: [id number]  Attending Physician Name and Address:  Burnadette Pop, MD  Relative Name and Phone Number:  [name], son, [phone]    Current Level of Care: Hospital Recommended Level of Care: Skilled Nursing Facility Prior Approval Number:    Date Approved/Denied:   PASRR Number: [id number] F (valid 8/19 - 07/26/18)  Discharge Plan: SNF    Current Diagnoses: Patient Active Problem List   Diagnosis Date Noted  . Acute renal failure (ARF) (HCC) 05/05/2018  . Hypoglycemia without diagnosis of diabetes mellitus 05/05/2018  . Dementia 05/05/2018  . Evaluation by psychiatric service required   . GERD (gastroesophageal reflux disease) 04/15/2018  . UTI (urinary tract infection) 04/15/2018  . Atrial fibrillation, chronic (HCC) 04/15/2018  . Depression 04/15/2018  . Fall 04/15/2018  . Tobacco abuse 04/15/2018  . Sepsis (HCC) 04/15/2018  . Acute metabolic encephalopathy 04/15/2018  . Hypertensive emergency 03/25/2018  . NSTEMI (non-ST elevated myocardial infarction) (HCC)   . Troponin level elevated   . IVH (intraventricular hemorrhage) (HCC) 03/19/2018  . 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 Long term current use of amiodarone 03/27/2017  . Severe recurrent major depression without psychotic features (HCC) 11/19/2016    Class: Chronic  . Pre-procedure lab exam 09/13/2016  . Generalized headaches 07/04/2016  . Protein-calorie malnutrition, severe (HCC) 04/03/2015  . Chronic diastolic CHF (congestive heart failure), NYHA class 2 (HCC) 01/24/2015  . Demand ischemia secondary to AF with RVR 12/13/2014  . CKD (chronic kidney  disease), stage III (HCC) 11/11/2014  . Hypokalemia 05/22/2014  . Mood disorder (HCC) 03/20/2014  . HTN (hypertension) 03/20/2014  . Anemia, unspecified 03/20/2014  . Smoker 03/20/2014  . Atrial fibrillation with RVR (HCC) 03/03/2014  . AKI (acute kidney injury) (HCC) 03/03/2014  . Hypotension 03/03/2014  . Pulmonary hypertension (HCC) 03/03/2014  . COPD (chronic obstructive pulmonary disease) (HCC) 03/03/2014  . H/O: CVA (cerebrovascular accident)   . Long-term (current) use of anticoagulants   . Cardiomyopathy-h/o tachycardia mediated-EF 65% per echo March 2016 02/18/2014  . Hyperlipidemia 02/18/2014  . CAD '07, LAD PCI 2012, SVG-PDA PTCA 11/10/14 02/16/2014  . Chronic diastolic heart failure (HCC)   . Pulmonary nodules   . Hypothyroidism   . OSA- C-pap intol     Orientation RESPIRATION BLADDER Height & Weight     Self, Place  Normal, O2(intermittant O2) External catheter, Incontinent Weight: 149 lb 14.6 oz (68 kg) Height:  5\' 4"  (162.6 cm)  BEHAVIORAL SYMPTOMS/MOOD NEUROLOGICAL BOWEL NUTRITION STATUS      Continent Diet(please see DC summary)  AMBULATORY STATUS COMMUNICATION OF NEEDS Skin   Extensive Assist Verbally Normal                       Personal Care Assistance Level of Assistance  Bathing, Feeding, Dressing Bathing Assistance: Maximum assistance Feeding assistance: Independent Dressing Assistance: Limited assistance     Functional Limitations Info  Sight, Hearing, Speech Sight Info: Adequate Hearing Info: Adequate Speech Info: Adequate    SPECIAL CARE FACTORS FREQUENCY  PT (By licensed PT), OT (By licensed OT)     PT Frequency: 5x week OT Frequency: 5x week  Contractures Contractures Info: Not present    Additional Factors Info  Code Status, Allergies, Psychotropic Code Status Info: Full Code Allergies Info: AVELOX MOXIFLOXACIN HCL IN NACL, AMOXICILLIN, PAMELOR NORTRIPTYLINE HCL, PENICILLINS, HYDROCODONE, OXYCODONE, SULFA  ANTIBIOTICS  Psychotropic Info: buPROPion (WELLBUTRIN XL) 24 hr tablet 150 mg morning at 10am PO; escitalopram (LEXAPRO) tablet 10 mg daily PO; mirtazapine (REMERON) tablet 15 mg daily at bedtime PO         Current Medications (05/06/2018):  This is the current hospital active medication list Current Facility-Administered Medications  Medication Dose Route Frequency Provider Last Rate Last Dose  . acetaminophen (TYLENOL) tablet 650 mg  650 mg Oral Q6H PRN Jonah Blue, MD   650 mg at 05/05/18 2236   Or  . acetaminophen (TYLENOL) suppository 650 mg  650 mg Rectal Q6H PRN Jonah Blue, MD      . albuterol (PROVENTIL) (2.5 MG/3ML) 0.083% nebulizer solution 3 mL  3 mL Inhalation Q4H PRN Jonah Blue, MD      . aspirin EC tablet 81 mg  81 mg Oral Daily Jonah Blue, MD   81 mg at 05/06/18 1128  . atorvastatin (LIPITOR) tablet 40 mg  40 mg Oral q1800 Jonah Blue, MD      . buPROPion (WELLBUTRIN XL) 24 hr tablet 150 mg  150 mg Oral q morning - 10a Jonah Blue, MD   150 mg at 05/06/18 1129  . dextrose 10 % infusion   Intravenous Continuous Adhikari, Amrit, MD      . diltiazem (CARDIZEM) tablet 60 mg  60 mg Oral Q6H Jonah Blue, MD   60 mg at 05/06/18 1128  . docusate sodium (COLACE) capsule 100 mg  100 mg Oral BID Jonah Blue, MD   100 mg at 05/06/18 1129  . escitalopram (LEXAPRO) tablet 10 mg  10 mg Oral Daily Jonah Blue, MD   10 mg at 05/06/18 1128  . famotidine (PEPCID) tablet 20 mg  20 mg Oral BID Jonah Blue, MD   20 mg at 05/06/18 1129  . fluticasone (FLONASE) 50 MCG/ACT nasal spray 2 spray  2 spray Each Nare Daily Jonah Blue, MD   2 spray at 05/06/18 1130  . levothyroxine (SYNTHROID, LEVOTHROID) tablet 200 mcg  200 mcg Oral QAC breakfast Jonah Blue, MD   200 mcg at 05/06/18 [phone]  . magnesium oxide (MAG-OX) tablet 400 mg  400 mg Oral Daily Jonah Blue, MD   400 mg at 05/06/18 1128  . methylPREDNISolone sodium succinate (SOLU-MEDROL) 125 mg/2 mL  injection 125 mg  125 mg Intravenous Once Burnadette Pop, MD      . metoprolol tartrate (LOPRESSOR) tablet 25 mg  25 mg Oral BID Jonah Blue, MD   25 mg at 05/06/18 1131  . mirtazapine (REMERON) tablet 15 mg  15 mg Oral QHS Jonah Blue, MD      . ondansetron [facility]) tablet 4 mg  4 mg Oral Q6H PRN Jonah Blue, MD       Or  . ondansetron [facility]) injection 4 mg  4 mg Intravenous Q6H PRN Jonah Blue, MD      . sodium chloride flush (NS) 0.9 % injection 3 mL  3 mL Intravenous PRN Jonah Blue, MD      . topiramate (TOPAMAX) tablet 50 mg  50 mg Oral BID Jonah Blue, MD   50 mg at 05/06/18 1130     Discharge Medications: Please see discharge summary for a list of discharge medications.  Relevant Imaging Results:  Relevant Lab Results:  Additional Information SSN: [id number] [place], [place]

## 2018-05-06 NOTE — Clinical Social Work Note (Signed)
Clinical Social Work Assessment  Patient Details  Name: [name] MRN: [id number] Date of Birth: 10/20/1955  Date of referral:  05/06/18               Reason for consult:  Discharge Planning, Facility Placement                Permission sought to share information with:  Family Supports Permission granted to share information::  Yes, Verbal Permission Granted  Name::     [name]  Agency::     Relationship::  Son  Contact Information:  [phone]  Housing/Transportation Living arrangements for the past 2 months:  Skilled Nursing Facility([facility]) Source of Information:  Adult Children, Other (Comment Required)([name], [facility]) Patient Interpreter Needed:  None Criminal Activity/Legal Involvement Pertinent to Current Situation/Hospitalization:  No - Comment as needed Significant Relationships:  Adult Children(Son [name]) Lives with:  Facility Resident Do you feel safe going back to the place where you live?  Yes(Patient wants to d/c home but also reported that she cannot get out of bed or transfer without assistance) Need for family participation in patient care:  Yes (Comment)  Care giving concerns:  Patient reported that she gets the help she needs at the skilled facility, but it is not 'friendly' help. Son [name] reported that the SNF she came from might not be his first choice for his mother, but he understands why she is there and that she needs assistance.  Social Worker assessment / plan: CSW initially talked with patient at the bedside regarding her discharge plan. [name] was sitting up in bed and was alert, but not totally oriented. She intially informed CSW that she was going home, but when asked informed CSW that she could not get out of bed without assistance. Patient does not like the help or the food at the facility understands that she will be returning to [facility] at discharge.  CSW talked with patient's son [name] by phone  while in the room with patient and he provided history and indicated that his mother would be returning to [facility] at discharge. He reported that she has roommates, but no help at home. Son expressed understanding of how patient came to be accepted to [facility] on a prior admission and is agreement with her returning to this facility.  CSW advised that he has applied for Medicaid and needs to take worker some additional information so that application can be processed.  CSW contacted hospital liaison Tina regarding patient's discharge and they are willing to take patient back with another LOG.  Employment status:  Disabled (Comment on whether or not currently receiving Disability) Insurance information:  Managed Medicare([employer]) PT Recommendations:  Skilled Nursing Facility Information / Referral to community resources:  Other (Comment Required)(None needed or requested as son is in agreement with patient returning to [facility])  Patient/Family's Response to care: No concerns expressed by patient or son regarding care during hospitalization.  Patient/Family's Understanding of and Emotional Response to Diagnosis, Current Treatment, and Prognosis:  Son expressed understanding of patient's need for continued SNF stay.  Emotional Assessment Appearance:  Appears stated age Attitude/Demeanor/Rapport:  Engaged Affect (typically observed):  Other, Flat(Not oriented) Orientation:  Oriented to Self Alcohol / Substance use:  Tobacco Use, Alcohol Use, Illicit Drugs(Patient reported that she smokes and does not drink or use illicit drugs) Psych involvement (Current and /or in the community):  No (Comment)  Discharge Needs  Concerns to be addressed:  Discharge Planning Concerns  Readmission within the last 30 days:  Yes Current discharge risk:  None Barriers to Discharge:  Continued Medical Work up   [employer] Lazaro Arms, LCSW 05/06/2018, 6:17 PM

## 2018-05-06 NOTE — Progress Notes (Signed)
PROGRESS NOTE    [name]  BJY:[id number] DOB: 1956-05-31 DOA: 05/05/2018 PCP: Madelin Headings, MD   Brief Narrative: Patient is a 62 year old female with past medical history of A. Fib,.  Hypertension, coronary disease status post CABG, hypothyroidism, dementia, hyperlipidemia, COPD, CHF who was sent from [facility] health care for the evaluation of altered mental status.  Patient was found to be hypoglycemic on presentation.  Urinary tract infection was also suspected on presentation.  Patient was also found to have acute kidney injury.  Assessment & Plan:   Principal Problem:   Acute metabolic encephalopathy Active Problems:   Hypothyroidism   Chronic diastolic heart failure (HCC)   HTN (hypertension)   Atrial fibrillation, chronic (HCC)   Acute renal failure (ARF) (HCC)   Hypoglycemia without diagnosis of diabetes mellitus   Dementia   Confusion/dementia/acute metabolic encephalopathy ?: Multifactorial etiology.  Most likely secondary to hypoglycemia, dehydration on this admission.  She has history of recent stroke, dementia.  She was just discharged from here after being managed for the same problem on 04/27/18.  Currently she is oriented to place and person.  She was also able to tell the current year.  She has been evaluated by psychiatrist on her last admission. She could have early and rapidly progressing dementia.  Palliative care was also requested for evaluation to discuss goals of care. CT head on 04/15/2018 showed resolving blood products in the left lateral ventricle and encephalomalacia at the site of prior right MCA infarct.  Hypoglycemia: Unknown etiology.  Patient is not on any antihyperglycemic agents.  Had to be started on D10.  I have discontinued D10 infusion and have started on D5- normal saline for now.  Patient's blood sugars are acceptable this morning.  We will continue to monitor CBC.  Hypoglycemia suspected to be from poor oral intake.  Her hemoglobin A1c on  7/13 was 5.1.  Acute kidney injury: Baseline creatinine ranges from 1.1-1.2.  Presented with creatinine of 2.8.  Kidney function improving with IV fluids.  We will continue to monitor her BMP.  Bacteremia: 1 of the blood cultures showed methicillin-resistant coagulase negative staph.  Most likely this is contamination.  We will repeat the blood cultures.  Lactic acidosis: Most likely secondary to dehydration.  Resolved with IV fluids.  UTI: Urinalysis was suggestive.  Patient does not report any dysuria.  We will follow-up urine culture.  Will hold on antibiotics.  Atrial fibrillation: Currently heart rate is controlled.  Currently she is on metoprolol and Cardizem.  She is not on any anticoagulation because of history of intraventricular hemorrhage .  Hypertension: Currently normotensive.  Continue current regimen  Hypothyroidism: Continue Synthyroid  History of chronic diastolic CHF: Currently euvolemic.  Continue current medications.  History of COPD: Currently stable.  History of stroke/IVH: History of stroke in 2012.  Recent history of intraventricular hemorrhage in July this year.  Depression: Seen by psychiatry on her last admission.  Currently on Wellbutrin, Lexapro.   DVT prophylaxis: SCD Code Status: Full Family Communication: None present at the bedside Disposition Plan: Back to skilled nursing facility in 1 to 2 days.   Consultants: None  Procedures:None  Antimicrobials:None  Subjective: Patient seen and examined at bedside this morning.  Remains comfortable.  Her mental status has improved today.  Hemodynamically stable  Objective: Vitals:   05/05/18 2041 05/05/18 2311 05/06/18 0455 05/06/18 1026  BP: 116/88  116/83 117/86  Pulse: 60  67 67  Resp: 18  18 18   Temp: [name])  97.4 F (36.3 C)  98 F (36.7 C)   TempSrc: Oral  Oral   SpO2: 92%  (!) 84%   Weight:  68 kg    Height:  5\' 4"  (1.626 m)      Intake/Output Summary (Last 24 hours) at 05/06/2018  1033 Last data filed at 05/06/2018 0600 Gross per 24 hour  Intake 3795.06 ml  Output 0 ml  Net 3795.06 ml   Filed Weights   05/05/18 2311  Weight: 68 kg    Examination:  General exam: Appears calm and comfortable ,Not in distress,average built HEENT:PERRL,Oral mucosa moist, Ear/Nose normal on gross exam Respiratory system: Bilateral equal air entry, normal vesicular breath sounds, no wheezes or crackles  Cardiovascular system: S1 & S2 heard, RRR. No JVD, murmurs, rubs, gallops or clicks. No pedal edema. Gastrointestinal system: Abdomen is nondistended, soft and nontender. No organomegaly or masses felt. Normal bowel sounds heard. Central nervous system: Alert and oriented to place and person.  Could tell current year.  No focal neurological deficits. Extremities: No edema, no clubbing ,no cyanosis, distal peripheral pulses palpable. Skin: No rashes, lesions or ulcers,no icterus ,no pallor   Data Reviewed: I have personally reviewed following labs and imaging studies  CBC: Recent Labs  Lab 05/05/18 1235 05/06/18 0324  WBC 6.3 6.2  NEUTROABS 5.2  --   HGB 12.7 11.7*  HCT 38.6 36.6  MCV 100.0 102.5*  PLT 240 202   Basic Metabolic Panel: Recent Labs  Lab 05/05/18 1214 05/06/18 0324  NA 134* 135  K 3.3* 3.8  CL 100 106  CO2 22 19*  GLUCOSE 96 117*  BUN 53* 39*  CREATININE 2.83* 1.88*  CALCIUM 8.7* 8.3*   GFR: Estimated Creatinine Clearance: 30.1 mL/min (A) (by C-G formula based on SCr of 1.88 mg/dL (H)). Liver Function Tests: Recent Labs  Lab 05/05/18 1214  AST 28  ALT 16  ALKPHOS 63  BILITOT 0.9  PROT 5.9*  ALBUMIN 2.6*   No results for input(s): LIPASE, AMYLASE in the last 168 hours. No results for input(s): AMMONIA in the last 168 hours. Coagulation Profile: No results for input(s): INR, PROTIME in the last 168 hours. Cardiac Enzymes: No results for input(s): CKTOTAL, CKMB, CKMBINDEX, TROPONINI in the last 168 hours. BNP (last 3 results) No results  for input(s): PROBNP in the last 8760 hours. HbA1C: No results for input(s): HGBA1C in the last 72 hours. CBG: Recent Labs  Lab 05/05/18 1158 05/05/18 1424 05/05/18 1504 05/05/18 1638  GLUCAP 150* 38* 90 102*   Lipid Profile: No results for input(s): CHOL, HDL, LDLCALC, TRIG, CHOLHDL, LDLDIRECT in the last 72 hours. Thyroid Function Tests: No results for input(s): TSH, T4TOTAL, FREET4, T3FREE, THYROIDAB in the last 72 hours. Anemia Panel: No results for input(s): VITAMINB12, FOLATE, FERRITIN, TIBC, IRON, RETICCTPCT in the last 72 hours. Sepsis Labs: Recent Labs  Lab 05/05/18 1246 05/05/18 1436  LATICACIDVEN 2.01* 1.21    Recent Results (from the past 240 hour(s))  Blood Culture (routine x 2)     Status: None (Preliminary result)   Collection Time: 05/05/18 12:35 PM  Result Value Ref Range Status   Specimen Description BLOOD LEFT ANTECUBITAL  Final   Special Requests   Final    BOTTLES DRAWN AEROBIC AND ANAEROBIC Blood Culture adequate volume   Culture   Final    NO GROWTH < 24 HOURS Performed at [facility], [address] [place], [place] [address]    Report Status PENDING  Incomplete  Blood Culture (routine x 2)     Status: None (Preliminary result)   Collection Time: 05/05/18  1:13 PM  Result Value Ref Range Status   Specimen Description BLOOD RIGHT ANTECUBITAL  Final   Special Requests   Final    BOTTLES DRAWN AEROBIC AND ANAEROBIC Blood Culture adequate volume   Culture  Setup Time   Final    GRAM POSITIVE COCCI IN CLUSTERS ANAEROBIC BOTTLE ONLY Organism ID to follow CRITICAL RESULT CALLED TO, READ BACK BY AND VERIFIED WITH: Patrick North PharmD 10:00 05/06/18 ([email or web address])    Culture   Final    NO GROWTH < 24 HOURS Performed at [facility], [address] [place], [place] [address]    Report Status PENDING  Incomplete  Blood Culture ID Panel (Reflexed)     Status: Abnormal   Collection Time: 05/05/18  1:13 PM  Result Value Ref Range Status    Enterococcus species NOT DETECTED NOT DETECTED Final   Listeria monocytogenes NOT DETECTED NOT DETECTED Final   Staphylococcus species DETECTED (A) NOT DETECTED Final    Comment: Methicillin (oxacillin) resistant coagulase negative staphylococcus. Possible blood culture contaminant (unless isolated from more than one blood culture draw or clinical case suggests pathogenicity). No antibiotic treatment is indicated for blood  culture contaminants. CRITICAL RESULT CALLED TO, READ BACK BY AND VERIFIED WITH: Patrick North PharmD 10:00 05/06/18 ([email or web address])    Staphylococcus aureus NOT DETECTED NOT DETECTED Final   Methicillin resistance DETECTED (A) NOT DETECTED Final    Comment: CRITICAL RESULT CALLED TO, READ BACK BY AND VERIFIED WITH: Patrick North PharmD 10:00 05/06/18 ([email or web address])    Streptococcus species NOT DETECTED NOT DETECTED Final   Streptococcus agalactiae NOT DETECTED NOT DETECTED Final   Streptococcus pneumoniae NOT DETECTED NOT DETECTED Final   Streptococcus pyogenes NOT DETECTED NOT DETECTED Final   Acinetobacter baumannii NOT DETECTED NOT DETECTED Final   Enterobacteriaceae species NOT DETECTED NOT DETECTED Final   Enterobacter cloacae complex NOT DETECTED NOT DETECTED Final   Escherichia coli NOT DETECTED NOT DETECTED Final   Klebsiella oxytoca NOT DETECTED NOT DETECTED Final   Klebsiella pneumoniae NOT DETECTED NOT DETECTED Final   Proteus species NOT DETECTED NOT DETECTED Final   Serratia marcescens NOT DETECTED NOT DETECTED Final   Haemophilus influenzae NOT DETECTED NOT DETECTED Final   Neisseria meningitidis NOT DETECTED NOT DETECTED Final   Pseudomonas aeruginosa NOT DETECTED NOT DETECTED Final   Candida albicans NOT DETECTED NOT DETECTED Final   Candida glabrata NOT DETECTED NOT DETECTED Final   Candida krusei NOT DETECTED NOT DETECTED Final   Candida parapsilosis NOT DETECTED NOT DETECTED Final   Candida tropicalis NOT DETECTED NOT DETECTED Final    Comment: Performed at  [facility], [address], [place], [place] [address]  MRSA PCR Screening     Status: None   Collection Time: 05/06/18  8:16 AM  Result Value Ref Range Status   MRSA by PCR NEGATIVE NEGATIVE Final    Comment:        The GeneXpert MRSA Assay (FDA approved for NASAL specimens only), is one component of a comprehensive MRSA colonization surveillance program. It is not intended to diagnose MRSA infection nor to guide or monitor treatment for MRSA infections. Performed at [facility], [address], [place], [place] [address]          Radiology Studies: Dg Chest Port 1 View  Result Date: 05/05/2018 CLINICAL DATA:  AMS; Altered from baseline. Pt is  alert but unable to accurately give year or month at time of triage. I Pt has hx of CVA with residual left sided weakness. Pt states she usually walks without assistive device. EXAM: PORTABLE CHEST 1 VIEW COMPARISON:  04/15/2018 FINDINGS: Status post CABG. Heart is enlarged. There are mildly prominent interstitial markings, stable over prior studies and likely chronic. No discrete consolidations. No pleural effusions. IMPRESSION: Stable cardiomegaly and prominent interstitial markings. Electronically Signed   By: Norva Pavlov M.D.   On: 05/05/2018 12:51        Scheduled Meds: . aspirin EC  81 mg Oral Daily  . atorvastatin  40 mg Oral q1800  . buPROPion  150 mg Oral q morning - 10a  . diltiazem  60 mg Oral Q6H  . docusate sodium  100 mg Oral BID  . escitalopram  10 mg Oral Daily  . famotidine  20 mg Oral BID  . fluticasone  2 spray Each Nare Daily  . levothyroxine  200 mcg Oral QAC breakfast  . magnesium oxide  400 mg Oral Daily  . metoprolol tartrate  25 mg Oral BID  . mirtazapine  15 mg Oral QHS  . topiramate  50 mg Oral BID   Continuous Infusions: . dextrose 5 % and 0.9% NaCl    . lactated ringers 100 mL/hr at 05/06/18 0600     LOS: 1 day    Time spent: 35 mins.More than 50% of that time was spent in  counseling and/or coordination of care.      Burnadette Pop, MD [facility] Pager [phone]  If 7PM-7AM, please contact night-coverage [email or web address] Password [facility] 05/06/2018, 10:33 AM

## 2018-05-07 DIAGNOSIS — E440 Moderate protein-calorie malnutrition: Secondary | ICD-10-CM

## 2018-05-07 DIAGNOSIS — F0390 Unspecified dementia without behavioral disturbance: Secondary | ICD-10-CM

## 2018-05-07 LAB — GLUCOSE, CAPILLARY
GLUCOSE-CAPILLARY: 172 mg/dL — AB (ref 70–99)
GLUCOSE-CAPILLARY: 141 mg/dL — AB (ref 70–99)
GLUCOSE-CAPILLARY: 117 mg/dL — AB (ref 70–99)
GLUCOSE-CAPILLARY: 83 mg/dL (ref 70–99)
Glucose-Capillary: 136 mg/dL — ABNORMAL HIGH (ref 70–99)
Glucose-Capillary: 121 mg/dL — ABNORMAL HIGH (ref 70–99)
Glucose-Capillary: 126 mg/dL — ABNORMAL HIGH (ref 70–99)
Glucose-Capillary: 118 mg/dL — ABNORMAL HIGH (ref 70–99)

## 2018-05-07 LAB — BASIC METABOLIC PANEL
Anion gap: 9 (ref 5–15)
BUN: 23 mg/dL — ABNORMAL HIGH (ref 6–20)
CO2: 23 mmol/L (ref 22–32)
Calcium: 8.4 mg/dL — ABNORMAL LOW (ref 8.9–10.3)
Chloride: 101 mmol/L (ref 98–111)
Creatinine, Ser: 1.27 mg/dL — ABNORMAL HIGH (ref 0.44–1.00)
GFR calc Af Amer: 52 mL/min — ABNORMAL LOW (ref >60)
GFR, EST NON AFRICAN AMERICAN: 45 mL/min — AB (ref >60)
GLUCOSE: 211 mg/dL — AB (ref 70–99)
Potassium: 3.9 mmol/L (ref 3.5–5.1)
Sodium: 133 mmol/L — ABNORMAL LOW (ref 135–145)

## 2018-05-07 LAB — C-PEPTIDE: C PEPTIDE: 10.6 ng/mL — AB (ref 1.1–4.4)

## 2018-05-07 MED ORDER — ENSURE ENLIVE PO LIQD
237.0000 mL | Freq: Two times a day (BID) | ORAL | Status: DC
  Administered 2018-05-07 – 2018-05-08 (×3): 237 mL via ORAL

## 2018-05-07 MED ORDER — SODIUM CHLORIDE 0.9 % IV SOLN
INTRAVENOUS | Status: DC
  Administered 2018-05-07 – 2018-05-08 (×3): via INTRAVENOUS

## 2018-05-07 NOTE — Progress Notes (Signed)
PROGRESS NOTE    [name]  PNP:[id number] DOB: 09/15/1955 DOA: 05/05/2018 PCP: Madelin Headings, MD   Brief Narrative: Patient is a 62 year old female with past medical history of A. Fib,.  Hypertension, coronary disease status post CABG, hypothyroidism, dementia, hyperlipidemia, COPD, CHF who was sent from [facility] health care for the evaluation of altered mental status.  Patient was found to be hypoglycemic on presentation.  Urinary tract infection was also suspected on presentation.  Patient was also found to have acute kidney injury.  Assessment & Plan:   Principal Problem:   Acute metabolic encephalopathy Active Problems:   Hypothyroidism   Chronic diastolic heart failure (HCC)   HTN (hypertension)   Atrial fibrillation, chronic (HCC)   Acute renal failure (ARF) (HCC)   Hypoglycemia without diagnosis of diabetes mellitus   Dementia   Confusion/dementia/acute metabolic encephalopathy ?: Multifactorial etiology.  Most likely secondary to hypoglycemia, dehydration on this admission.  She has history of recent stroke, dementia.  She was just discharged from here after being managed for the same problem on 04/27/18.  Currently she is oriented to place and person.  She was also able to tell the current year.  She has been evaluated by psychiatrist on her last admission. She could have early and rapidly progressing dementia.  Palliative care was also requested for evaluation to discuss goals of care.Planning for completion of Most forms CT head on 04/15/2018 showed resolving blood products in the left lateral ventricle and encephalomalacia at the site of prior right MCA infarct. Her mental status is back to baseline now.  Hypoglycemia: Patient was on glipizide and metformin at home as per the medical reconciliation.  Patient became hypoglycemic due to these agents on the background of  acute kidney injury. D10 stopped. Patient's blood sugars are acceptable this morning.  We will continue  to monitor CBG.  Her hemoglobin A1c on 7/13 was 5.1.We will discontinue these agents on discharge.  C-peptide came elevated.  She was given a dose of Solu-Medrol yesterday.  Acute kidney injury: Baseline creatinine ranges from 1.1-1.2.  Presented with creatinine of 2.8.  Kidney function improving with IV fluids.  We will continue to monitor her BMP.  Bacteremia: 1 of the blood cultures showed methicillin-resistant coagulase negative staph.  Most likely this is contamination.  Repeat blood cultures sent .  Lactic acidosis: Most likely secondary to dehydration.  Resolved with IV fluids.  UTI: Urine culture did not show any growth  Atrial fibrillation: Currently heart rate is controlled.  On her previous medication list as per the discharge summaries, she should be on metoprolol and Cardizem. But current med list does not show  these medications.Will check with pharmacy.  She is not on any anticoagulation because of history of intraventricular hemorrhage .  Hypertension: Currently normotensive.   Hypothyroidism: Continue Synthyroid  History of chronic diastolic CHF: Currently euvolemic.  Continue current medications.  History of COPD: Currently stable.  History of stroke/IVH: History of stroke in 2012.  Recent history of intraventricular hemorrhage in July this year.  Depression: Seen by psychiatry on her last admission.  Was supposed to be on Wellbutrin, Lexapro.  But current med list shows she is on fluoxetine   DVT prophylaxis: SCD Code Status: Full Family Communication: None present at the bedside Disposition Plan: Back to skilled nursing facility tomorrow   Consultants: None  Procedures:None  Antimicrobials:None  Subjective: Patient seen and examined at bedside this morning.  Remains comfortable.  Her mental status has improved today.  Hemodynamically stable.  Blood sugars have improved  Objective: Vitals:   05/06/18 1736 05/06/18 2100 05/07/18 0501 05/07/18 0830  BP: (!)  136/94 105/79 109/63 106/88  Pulse: 74 84 (!) 58 (!) 105  Resp: 18 18 16 18   Temp: 98.1 F (36.7 C) 98 F (36.7 C) 98 F (36.7 C) (!) 97.4 F (36.3 C)  TempSrc: Oral  Oral Oral  SpO2: (!) 83% 99% 99% (!) 82%  Weight:  68 kg    Height:        Intake/Output Summary (Last 24 hours) at 05/07/2018 1122 Last data filed at 05/07/2018 1100 Gross per 24 hour  Intake 1535.59 ml  Output 1100 ml  Net 435.59 ml   Filed Weights   05/05/18 2311 05/06/18 2100  Weight: 68 kg 68 kg    Examination:  General exam: Appears calm and comfortable ,Not in distress,average built HEENT:PERRL,Oral mucosa moist, Ear/Nose normal on gross exam Respiratory system: Bilateral equal air entry, normal vesicular breath sounds, no wheezes or crackles  Cardiovascular system: S1 & S2 heard, RRR. No JVD, murmurs, rubs, gallops or clicks. No pedal edema. Gastrointestinal system: Abdomen is nondistended, soft and nontender. No organomegaly or masses felt. Normal bowel sounds heard. Central nervous system: Alert and oriented to place and person.  Could tell current year.  No focal neurological deficits. Extremities: No edema, no clubbing ,no cyanosis, distal peripheral pulses palpable. Skin: No rashes, lesions or ulcers,no icterus ,no pallor   Data Reviewed: I have personally reviewed following labs and imaging studies  CBC: Recent Labs  Lab 05/05/18 1235 05/06/18 0324  WBC 6.3 6.2  NEUTROABS 5.2  --   HGB 12.7 11.7*  HCT 38.6 36.6  MCV 100.0 102.5*  PLT 240 202   Basic Metabolic Panel: Recent Labs  Lab 05/05/18 1214 05/06/18 0324 05/07/18 0320  NA 134* 135 133*  K 3.3* 3.8 3.9  CL 100 106 101  CO2 22 19* 23  GLUCOSE 96 117* 211*  BUN 53* 39* 23*  CREATININE 2.83* 1.88* 1.27*  CALCIUM 8.7* 8.3* 8.4*   GFR: Estimated Creatinine Clearance: 44.6 mL/min (A) (by C-G formula based on SCr of 1.27 mg/dL (H)). Liver Function Tests: Recent Labs  Lab 05/05/18 1214  AST 28  ALT 16  ALKPHOS 63    BILITOT 0.9  PROT 5.9*  ALBUMIN 2.6*   No results for input(s): LIPASE, AMYLASE in the last 168 hours. No results for input(s): AMMONIA in the last 168 hours. Coagulation Profile: No results for input(s): INR, PROTIME in the last 168 hours. Cardiac Enzymes: No results for input(s): CKTOTAL, CKMB, CKMBINDEX, TROPONINI in the last 168 hours. BNP (last 3 results) No results for input(s): PROBNP in the last 8760 hours. HbA1C: No results for input(s): HGBA1C in the last 72 hours. CBG: Recent Labs  Lab 05/06/18 1905 05/06/18 2059 05/06/18 2334 05/07/18 0735 05/07/18 1038  GLUCAP 73 160* 214* 172* 141*   Lipid Profile: No results for input(s): CHOL, HDL, LDLCALC, TRIG, CHOLHDL, LDLDIRECT in the last 72 hours. Thyroid Function Tests: No results for input(s): TSH, T4TOTAL, FREET4, T3FREE, THYROIDAB in the last 72 hours. Anemia Panel: No results for input(s): VITAMINB12, FOLATE, FERRITIN, TIBC, IRON, RETICCTPCT in the last 72 hours. Sepsis Labs: Recent Labs  Lab 05/05/18 1246 05/05/18 1436  LATICACIDVEN 2.01* 1.21    Recent Results (from the past 240 hour(s))  Blood Culture (routine x 2)     Status: None (Preliminary result)   Collection Time: 05/05/18 12:35 PM  Result Value  Ref Range Status   Specimen Description BLOOD LEFT ANTECUBITAL  Final   Special Requests   Final    BOTTLES DRAWN AEROBIC AND ANAEROBIC Blood Culture adequate volume   Culture   Final    NO GROWTH 2 DAYS Performed at [facility], [address], [place], [place] [address]    Report Status PENDING  Incomplete  Blood Culture (routine x 2)     Status: Abnormal (Preliminary result)   Collection Time: 05/05/18  1:13 PM  Result Value Ref Range Status   Specimen Description BLOOD RIGHT ANTECUBITAL  Final   Special Requests   Final    BOTTLES DRAWN AEROBIC AND ANAEROBIC Blood Culture adequate volume   Culture  Setup Time   Final    GRAM POSITIVE COCCI IN CLUSTERS ANAEROBIC BOTTLE ONLY CRITICAL  RESULT CALLED TO, READ BACK BY AND VERIFIED WITH: Patrick North PharmD 10:00 05/06/18 ([email or web address]) Performed at [facility], [address] [place], [place] [address]    Culture STAPHYLOCOCCUS SPECIES (COAGULASE NEGATIVE) (A)  Final   Report Status PENDING  Incomplete  Blood Culture ID Panel (Reflexed)     Status: Abnormal   Collection Time: 05/05/18  1:13 PM  Result Value Ref Range Status   Enterococcus species NOT DETECTED NOT DETECTED Final   Listeria monocytogenes NOT DETECTED NOT DETECTED Final   Staphylococcus species DETECTED (A) NOT DETECTED Final    Comment: Methicillin (oxacillin) resistant coagulase negative staphylococcus. Possible blood culture contaminant (unless isolated from more than one blood culture draw or clinical case suggests pathogenicity). No antibiotic treatment is indicated for blood  culture contaminants. CRITICAL RESULT CALLED TO, READ BACK BY AND VERIFIED WITH: Patrick North PharmD 10:00 05/06/18 ([email or web address])    Staphylococcus aureus NOT DETECTED NOT DETECTED Final   Methicillin resistance DETECTED (A) NOT DETECTED Final    Comment: CRITICAL RESULT CALLED TO, READ BACK BY AND VERIFIED WITH: Patrick North PharmD 10:00 05/06/18 ([email or web address])    Streptococcus species NOT DETECTED NOT DETECTED Final   Streptococcus agalactiae NOT DETECTED NOT DETECTED Final   Streptococcus pneumoniae NOT DETECTED NOT DETECTED Final   Streptococcus pyogenes NOT DETECTED NOT DETECTED Final   Acinetobacter baumannii NOT DETECTED NOT DETECTED Final   Enterobacteriaceae species NOT DETECTED NOT DETECTED Final   Enterobacter cloacae complex NOT DETECTED NOT DETECTED Final   Escherichia coli NOT DETECTED NOT DETECTED Final   Klebsiella oxytoca NOT DETECTED NOT DETECTED Final   Klebsiella pneumoniae NOT DETECTED NOT DETECTED Final   Proteus species NOT DETECTED NOT DETECTED Final   Serratia marcescens NOT DETECTED NOT DETECTED Final   Haemophilus influenzae NOT DETECTED NOT DETECTED  Final   Neisseria meningitidis NOT DETECTED NOT DETECTED Final   Pseudomonas aeruginosa NOT DETECTED NOT DETECTED Final   Candida albicans NOT DETECTED NOT DETECTED Final   Candida glabrata NOT DETECTED NOT DETECTED Final   Candida krusei NOT DETECTED NOT DETECTED Final   Candida parapsilosis NOT DETECTED NOT DETECTED Final   Candida tropicalis NOT DETECTED NOT DETECTED Final    Comment: Performed at [facility], [address] [place], [place] [address]  Urine culture     Status: None   Collection Time: 05/05/18  2:15 PM  Result Value Ref Range Status   Specimen Description URINE, CATHETERIZED  Final   Special Requests NONE  Final   Culture   Final    NO GROWTH Performed at [facility], [address] [place], [place] [address]  Report Status 05/06/2018 FINAL  Final  MRSA PCR Screening     Status: None   Collection Time: 05/06/18  8:16 AM  Result Value Ref Range Status   MRSA by PCR NEGATIVE NEGATIVE Final    Comment:        The GeneXpert MRSA Assay (FDA approved for NASAL specimens only), is one component of a comprehensive MRSA colonization surveillance program. It is not intended to diagnose MRSA infection nor to guide or monitor treatment for MRSA infections. Performed at [facility], [address], [place], [place] [address]   Culture, blood (routine x 2)     Status: None (Preliminary result)   Collection Time: 05/06/18 10:44 AM  Result Value Ref Range Status   Specimen Description BLOOD BLOOD LEFT FOREARM  Final   Special Requests   Final    BOTTLES DRAWN AEROBIC AND ANAEROBIC Blood Culture results may not be optimal due to an inadequate volume of blood received in culture bottles   Culture   Final    NO GROWTH < 24 HOURS Performed at [facility], [address] [place], [place] [address]    Report Status PENDING  Incomplete  Culture, blood (routine x 2)     Status: None (Preliminary result)   Collection Time: 05/06/18 10:45 AM    Result Value Ref Range Status   Specimen Description BLOOD LEFT ANTECUBITAL  Final   Special Requests   Final    BOTTLES DRAWN AEROBIC AND ANAEROBIC Blood Culture adequate volume   Culture   Final    NO GROWTH < 24 HOURS Performed at [facility], [address], [place], [place] [address]    Report Status PENDING  Incomplete         Radiology Studies: Dg Chest Port 1 View  Result Date: 05/05/2018 CLINICAL DATA:  AMS; Altered from baseline. Pt is alert but unable to accurately give year or month at time of triage. I Pt has hx of CVA with residual left sided weakness. Pt states she usually walks without assistive device. EXAM: PORTABLE CHEST 1 VIEW COMPARISON:  04/15/2018 FINDINGS: Status post CABG. Heart is enlarged. There are mildly prominent interstitial markings, stable over prior studies and likely chronic. No discrete consolidations. No pleural effusions. IMPRESSION: Stable cardiomegaly and prominent interstitial markings. Electronically Signed   By: Norva Pavlov M.D.   On: 05/05/2018 12:51        Scheduled Meds: . atorvastatin  40 mg Oral q1800  . magnesium oxide  400 mg Oral Daily   Continuous Infusions: . sodium chloride 75 mL/hr at 05/07/18 0947     LOS: 2 days    Time spent: 25 mins.More than 50% of that time was spent in counseling and/or coordination of care.      Burnadette Pop, MD [facility] Pager [phone]  If 7PM-7AM, please contact night-coverage [email or web address] Password [email or web address] 05/07/2018, 11:22 AM

## 2018-05-07 NOTE — Progress Notes (Signed)
Palliative care progress note  Reason for consult: Goals of care in light of progressive dementia, residual effect of strokes  I saw and examined [name] again today.  She remains unable to speak for herself in regards to wishes for goals of care.  I was able to speak with her son/HCPOA via phone.  He lives locally, but is currently without transportation and will not be able to get to hospital to meet in person.  I introduced palliative care as specialized medical care for people living with serious illness. It focuses on providing relief from the symptoms and stress of a serious illness. The goal is to improve quality of life for both the patient and the family.  We discussed clinical course for his mother since her stroke in 2012.  In light of this, we also discussed wishes moving forward in regard to advanced directives.  Concepts specific to code status and rehospitalization discussed.  We discussed difference between a aggressive medical intervention path and a palliative, comfort focused care path.  Values and goals of care important to patient and family were attempted to be elicited.  Her son understands concern.  At this point he feels that she needs time once she is situated at rehab to see how much she can recover.  Reports that after her stroke in 2012 she was in similar state and recovered over the next year.  He appears very realistic when discussing that this may not be the case this time.  - Continue current care plan.  Her son is continuing to work on Radiation protection practitioner for her. - Will send copy of MOST for and Hard Choices for Loving People for his review.  Questions and concerns addressed.   PMT will continue to support holistically.  Total time: 50 minutes Greater than 50%  of this time was spent counseling and coordinating care related to the above assessment and plan.  Romie Minus, MD [facility] Palliative Medicine Team [phone]

## 2018-05-07 NOTE — Evaluation (Signed)
Physical Therapy Evaluation Patient Details Name: [name] MRN: [id number] DOB: [birth year] Today's Date: 05/07/2018   History of Present Illness  62 y.o. female admitted with AMS from SNF. PMH includes but limited to: CVA right MCA, Hypotension, COPD, CAD,  CABG, B Knee surgery.   Clinical Impression  Patient with significant regression compared to last admission on 8/16 when she was ambulating with walker. On PT evaluation, pt displaying significantly decreased functional mobility secondary to LLE weakness/decreased range of motion, increased tone, cognitive deficits, and poor sitting balance. Requiring max assist to progress to edge of bed and once edge of bed, patient with episode of immediate urinary and fecal incontinence, requiring return to supine for performing peri care with nurse tech. Discussed patient's status with RN, Albin Felling. Recommend return to SNF. Will follow acutely.     Follow Up Recommendations SNF;Supervision/Assistance - 24 hour    Equipment Recommendations  Other (comment)(defer to next venue)    Recommendations for Other Services OT consult     Precautions / Restrictions Precautions Precautions: Fall Precaution Comments: urinary frequency Restrictions Weight Bearing Restrictions: No      Mobility  Bed Mobility Overal bed mobility: Needs Assistance Bed Mobility: Sit to Supine;Rolling;Supine to Sit Rolling: Max assist   Supine to sit: Max assist Sit to supine: Max assist;+2 for safety/equipment;+2 for physical assistance   General bed mobility comments: max assist for progressing LLE to edge of bed and assisting trunk to upright. max assist for rolling right and left to perform peri care  Transfers                 General transfer comment: Deferred  Ambulation/Gait                Stairs            Wheelchair Mobility    Modified Rankin (Stroke Patients Only) Modified Rankin (Stroke Patients Only) Pre-Morbid Rankin Score:  Moderately severe disability Modified Rankin: Severe disability     Balance Overall balance assessment: Needs assistance Sitting-balance support: No upper extremity supported;Feet supported Sitting balance-Leahy Scale: Poor Sitting balance - Comments: requiring mod-max assist to hold upright                                     Pertinent Vitals/Pain Pain Assessment: Faces Faces Pain Scale: Hurts a little bit Pain Location: abdomen Pain Descriptors / Indicators: Guarding;Discomfort Pain Intervention(s): Monitored during session    Home Living Family/patient expects to be discharged to:: Skilled nursing facility                      Prior Function Level of Independence: Needs assistance   Gait / Transfers Assistance Needed: poor historian. per chart review, on last admission (8/16) ambulating 60 feet with walker and min assist. However, appears to have drastic functional change since then           Hand Dominance   Dominant Hand: Right    Extremity/Trunk Assessment   Upper Extremity Assessment Upper Extremity Assessment: RUE deficits/detail;LUE deficits/detail RUE Deficits / Details: WFL for tasks assessed LUE Deficits / Details: Residual deficits from previous CVA. Patient resting in shoulder internal rotation and elbow flexion. PROM shoulder flexion WFL, elbow extension limited.     Lower Extremity Assessment Lower Extremity Assessment: RLE deficits/detail;LLE deficits/detail RLE Deficits / Details: at least anti gravity LLE Deficits / Details: baseline residual deficits from  CVA. Ankle dorsiflexion grossly limited. Increased tone       Communication   Communication: No difficulties  Cognition Arousal/Alertness: Awake/alert Behavior During Therapy: Flat affect Overall Cognitive Status: No family/caregiver present to determine baseline cognitive functioning Area of Impairment: Orientation;Awareness;Attention;Memory;Safety/judgement                  Orientation Level: Disoriented to;Time;Situation Current Attention Level: Sustained Memory: Decreased short-term memory Following Commands: Follows one step commands inconsistently Safety/Judgement: Decreased awareness of safety;Decreased awareness of deficits Awareness: Intellectual Problem Solving: Requires verbal cues;Slow processing;Decreased initiation;Difficulty sequencing;Requires tactile cues General Comments: very flat affect, oriented to self and place but not time. perseveration on statements. follows simple commands inconsistently and unable to follow multistep commands      General Comments      Exercises     Assessment/Plan    PT Assessment Patient needs continued PT services  PT Problem List Decreased strength;Decreased mobility;Decreased cognition;Decreased balance;Decreased knowledge of precautions;Decreased safety awareness;Decreased activity tolerance;Cardiopulmonary status limiting activity;Decreased knowledge of use of DME;Decreased range of motion       PT Treatment Interventions      PT Goals (Current goals can be found in the Care Plan section)  Acute Rehab PT Goals Patient Stated Goal: pt unable to participate in goal setting PT Goal Formulation: Patient unable to participate in goal setting Time For Goal Achievement: 05/21/18 Potential to Achieve Goals: Fair    Frequency Min 2X/week   Barriers to discharge        Co-evaluation               AM-PAC PT "6 Clicks" Daily Activity  Outcome Measure Difficulty turning over in bed (including adjusting bedclothes, sheets and blankets)?: Unable Difficulty moving from lying on back to sitting on the side of the bed? : Unable Difficulty sitting down on and standing up from a chair with arms (e.g., wheelchair, bedside commode, etc,.)?: Unable Help needed moving to and from a bed to chair (including a wheelchair)?: Total Help needed walking in hospital room?: Total Help needed climbing  3-5 steps with a railing? : Total 6 Click Score: 6    End of Session   Activity Tolerance: Patient limited by fatigue Patient left: in bed;with call bell/phone within reach;with bed alarm set Nurse Communication: Mobility status PT Visit Diagnosis: Other abnormalities of gait and mobility (R26.89);Other symptoms and signs involving the nervous system (R29.898);Muscle weakness (generalized) (M62.81)    Time: 1308-6578 PT Time Calculation (min) (ACUTE ONLY): 41 min   Charges:   PT Evaluation $PT Eval Moderate Complexity: 1 Mod PT Treatments $Therapeutic Activity: 23-37 mins        Laurina Bustle, PT, DPT Acute Rehabilitation Services  Pager: [phone]   Vanetta Mulders 05/07/2018, 10:59 AM

## 2018-05-07 NOTE — Progress Notes (Signed)
Initial Nutrition Assessment  DOCUMENTATION CODES:   Non-severe (moderate) malnutrition in context of chronic illness  INTERVENTION:   Ensure Enlive po BID, each supplement provides 350 kcal and 20 grams of protein  Feeding assistance and encouragement as needed at meal times   NUTRITION DIAGNOSIS:   Moderate Malnutrition related to chronic illness(progressive dementia, stroke with residual weakness, CHF, COPD) as evidenced by mild muscle depletion, percent weight loss.  GOAL:   Patient will meet greater than or equal to 90% of their needs  MONITOR:   PO intake, Supplement acceptance, Weight trends, Labs  REASON FOR ASSESSMENT:   Malnutrition Screening Tool    ASSESSMENT:   61 yo female admitted with AMS with hypoglycemia on presentation, baseline dementia; pt also with AKI. PMH includes HTN, CAD /CABG, COPD, HLD, CHF, dementia, stroke. Pt resides at [facility]. Pt with admission earlier this month for falls, confusion. At that time, pt seen by psychiatry and deemed to not have capacity to make own medical decisions   Pt alert on visit today, very slow to respond. Not able to get much information from pt. Pt does indicate "yes" to poor appetite and not eating well and "yes" to having lost weight but unsure how much.  Recorded po intake 0-25%  Pt appears to have lost weight recently. Weight appears to be in the 160-170 pound range over the past year up until this month; current wt 150 pounds. 10-20 pounds in 1 month; >5% wt loss in 1 month; significant for time frame   Labs: sodium 133 Meds: NS at 75 ml/hr, solumedrol   NUTRITION - FOCUSED PHYSICAL EXAM:    Most Recent Value  Orbital Region  No depletion  Upper Arm Region  No depletion  Thoracic and Lumbar Region  No depletion  Buccal Region  No depletion  Temple Region  No depletion  Clavicle and Acromion Bone Region  Mild depletion  Scapular Bone Region  Mild depletion  Dorsal Hand  Mild depletion   Patellar Region  Mild depletion  Anterior Thigh Region  Moderate depletion  Posterior Calf Region  Mild depletion       Diet Order:   Diet Order            Diet regular Room service appropriate? Yes; Fluid consistency: Thin  Diet effective now              EDUCATION NEEDS:   Not appropriate for education at this time  Skin:  Skin Assessment: Reviewed RN Assessment  Last BM:  8/27  Height:   Ht Readings from Last 1 Encounters:  05/05/18 5\' 4"  (1.626 m)    Weight:   Wt Readings from Last 1 Encounters:  05/06/18 68 kg    Ideal Body Weight:     BMI:  Body mass index is 25.73 kg/m.  Estimated Nutritional Needs:   Kcal:  1700-1900 kcals   Protein:  85-95 g  Fluid:  >/= 1.7 L   Romelle Starcher MS, RD, LDN, CNSC [phone] Pager  [phone] Weekend/On-Call Pager

## 2018-05-07 NOTE — Consult Note (Signed)
Consultation Note Date: 05/07/2018   Patient Name: [name]  DOB: Apr 11, 1956  MRN: [id number]  Age / Sex: 62 y.o., female  PCP: Regis Bill Standley Brooking, MD Referring Physician: Shelly Coss, MD  Reason for Consultation: Establishing goals of care  HPI/Patient Profile: 62 y.o. female  with past medical history of a fib on Xarelto, dementia, hypothyroidism, HTN, CAD s/p CABG, HLD, COPD, systolic CHF admitted on 01/11/6978 with AMS and likely sepsis from UTI.   Clinical Assessment and Goals of Care: I met today with Ms. Bratcher.  Flat affect.  Very slow to answer or does not answer most questions.  Will sometimes repeat back what I say rather than answer question.  With repeated questioning, she is oriented, but with no insight into her medical condition.   SUMMARY OF RECOMMENDATIONS   - Patient not able to make her own decisions.  Will reach out to son for discussion of goals of care.  Would benefit from completion of MOST form is possible.  Code Status/Advance Care Planning:  Full code   Symptom Management:   Per primary  Palliative Prophylaxis:   Delirium Protocol  Additional Recommendations (Limitations, Scope, Preferences):  Full Scope Treatment  Psycho-social/Spiritual:   Desire for further Chaplaincy support:no  Additional Recommendations: Caregiving  Support/Resources  Prognosis:   Unable to determine  Discharge Planning: SNF      Primary Diagnoses: Present on Admission: . Acute renal failure (ARF) ([place]) . Hypothyroidism . (Resolved) Acute encephalopathy . Hypoglycemia without diagnosis of diabetes mellitus . Dementia . HTN (hypertension) . Chronic diastolic heart failure ([place]) . Atrial fibrillation, chronic ([place]) . Acute metabolic encephalopathy   I have reviewed the medical record, interviewed the patient and family, and examined the patient. The following aspects are  pertinent.  Past Medical History:  Diagnosis Date  . Acute right MCA stroke ([place]) 11/07/10  . Atrial fibrillation ([place])    a. s/p TEE-DCCV 02/2104; b. Xarelto started  . CAD (coronary artery disease)    a.  cath 09/2010: LAD stent patent, S-Int/dCFX ok, S-PDA ok, L-LAD atretic;  b. Lexiscan Myoview (02/2014):  no ischemia, EF 55%; c. 11/2014 Cath/PCI: LM nl, LAD 20pISR, LCX 80-18m OM1 nl, RI 70p, RCA 40-598mRPDA 95ost/95-9539mTCA only w/ reduction to 50p/35m85m0d, L->LAD atretic, VG->RI->OM nl, VG->PDA 100p.  . Cardiomyopathy with EF 40% at TEE 02/17/14 (likely tachycardia mediated - Myoview 02/19/14 neg for ischemia with normal EF) 02/18/2014  . COPD (chronic obstructive pulmonary disease) (HCC)[place]. Depression   . Eczema   . GERD (gastroesophageal reflux disease)   . Headache   . HLD (hyperlipidemia)   . Homelessness 11/12/2011  . HPV test positive    with Ascus on pap 2015, followed by Dr HollMatthew SarasHx MRSA infection    Chest wall syndrome post CABG  . Hx of CABG   . Hx of transesophageal echocardiography (TEE) for monitoring 11/2010   TEE 11/2010: EF 60-65%, BAE, trivial atrial septal shunt;  right heart cath in 10/2010 with elevated  R and L heart pressures and diuretic started  . Hypertension   . Hypothyroidism   . Persistent atrial fibrillation ([place]) 10/2014  . Pulmonary nodules    repeat CT due in 11/2011  . Sleep apnea    recent sleep study  in 04/2014 per chart review  shows no significant OSA   Social History   Socioeconomic History  . Marital status: Divorced    Spouse name: Not on file  . Number of children: 2  . Years of education: Not on file  . Highest education level: Not on file  Occupational History  . Occupation: DISABLE   Social Needs  . Financial resource strain: Not on file  . Food insecurity:    Worry: Not on file    Inability: Not on file  . Transportation needs:    Medical: Not on file    Non-medical: Not on file  Tobacco Use  . Smoking status: Current  Every Day Smoker    Packs/day: 0.50    Years: 46.00    Pack years: 23.00    Types: Cigarettes  . Smokeless tobacco: Never Used  . Tobacco comment: 05/05/2018 "quit a couple weeks ago"  Substance and Sexual Activity  . Alcohol use: No    Alcohol/week: 0.0 standard drinks  . Drug use: No  . Sexual activity: Not Currently  Lifestyle  . Physical activity:    Days per week: Not on file    Minutes per session: Not on file  . Stress: Not on file  Relationships  . Social connections:    Talks on phone: Not on file    Gets together: Not on file    Attends religious service: Not on file    Active member of club or organization: Not on file    Attends meetings of clubs or organizations: Not on file    Relationship status: Not on file  Other Topics Concern  . Not on file  Social History Narrative   8/11   Divorced   After stroke was living with sister and mother after sister asked her to leave. Mom has since deceased    Difficulties over control.    Then moved back in for about 6 weeks.   . tranportaion difficult son  helping   Ex-smoker   Was living at friends  Sister and her had argument and she was told to leave .    She was living in a homeless shelter for the last 3 weeks [facility]    son had help with transportation and medication       Work status regular  before got sick on short-term disability now lost t insurance after the stroke and couldn't work was  denied ssi    2 times.  She is not eligible for Medicaid because she doesn't have dependent children.         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   Lives in  rented house 2 house mates males keep tp self has her own b room  Doing well    Family History  Problem Relation Age of Onset  . COPD Mother   .  Heart disease Mother   . Arthritis Mother        Rheumatoid and  PMR  . Osteoporosis Mother        Mom fractured hip  . Diabetes type II Mother   . Depression Mother   . Heart attack Father   . Depression Father   . Hypertension Father   . Alcohol abuse Father   . Depression Sister   . Anxiety disorder Sister   . Drug abuse Sister   . Stroke Neg Hx    Scheduled Meds: . atorvastatin  40 mg Oral q1800  . magnesium oxide  400 mg Oral Daily   Continuous Infusions: . sodium chloride 75 mL/hr at 05/07/18 0947   PRN Meds:.acetaminophen **OR** acetaminophen, albuterol, ondansetron **OR** ondansetron (ZOFRAN) IV, sodium chloride flush Medications Prior to Admission:  Prior to Admission medications   Medication Sig Start Date End Date Taking? Authorizing Provider  atorvastatin (LIPITOR) 40 MG tablet Take 1 tablet (40 mg total) by mouth daily at 6 PM. Patient taking differently: Take 20 mg by mouth daily at 6 PM.  03/24/18  Yes Biby, Massie Kluver, NP  cetirizine (ZYRTEC) 10 MG tablet Take 10 mg by mouth daily.   Yes [provider]  dicyclomine (BENTYL) 20 MG tablet Take 20 mg by mouth 2 (two) times daily.   Yes [provider]  diphenoxylate-atropine (LOMOTIL) 2.5-0.025 MG tablet Take 1 tablet by mouth every 8 (eight) hours as needed for diarrhea or loose stools.   Yes [provider]  FLUoxetine (PROZAC) 20 MG capsule Take 40 mg by mouth daily.   Yes [provider]  gabapentin (NEURONTIN) 300 MG capsule Take 300 mg by mouth every 8 (eight) hours.   Yes [provider]  glipiZIDE (GLUCOTROL) 5 MG tablet Take 5 mg by mouth 2 (two) times daily before a meal.   Yes [provider]  hydrochlorothiazide (HYDRODIURIL) 25 MG tablet Take 25 mg by mouth daily.   Yes [provider]  lisinopril (PRINIVIL,ZESTRIL) 10 MG tablet Take 10 mg by mouth daily.   Yes [provider]  metFORMIN (GLUCOPHAGE) 1000 MG tablet Take 1,000 mg by  mouth 2 (two) times daily with a meal.   Yes [provider]  methocarbamol (ROBAXIN) 750 MG tablet Take 750 mg by mouth every 12 (twelve) hours.   Yes [provider]  ondansetron (ZOFRAN) 4 MG tablet Take 4 mg by mouth every 6 (six) hours.   Yes [provider]   Allergies  Allergen Reactions  . Avelox [Moxifloxacin Hcl In Nacl] Other (See Comments)    Mental breakdown.  [name] Amoxicillin Hives  . Pamelor [Nortriptyline Hcl] Other (See Comments)    Made her want to hurt herself  . Penicillins Hives       . Hydrocodone Itching  . Oxycodone Itching  . Sulfa Antibiotics Other (See Comments)    unknown   Review of Systems Denies complaints.  Unreliable historian.  Physical Exam General: Alert, awake, in no acute distress.  HEENT: No bruits, no goiter, no JVD Heart: Regular rate and rhythm. No murmur appreciated. Lungs: Good air movement, clear Abdomen: Soft, nontender, nondistended, positive bowel sounds.  Ext: No significant edema Skin: Warm and dry Neuro: Grossly intact, nonfocal.  Vital Signs: BP 106/88 (BP Location: Left Arm)   Pulse (!) 105   Temp (!) 97.4 F (36.3 C) (Oral)   Resp 18   Ht '5\' 4"'$  (1.626 m)   Wt 68 kg   SpO2 (!) 82%   BMI 25.73 kg/m  Pain Scale: 0-10   Pain Score: 0-No pain  SpO2: SpO2: (!) 82 % O2 Device:SpO2: (!) 82 % O2 Flow Rate: .O2 Flow Rate (L/min): 2 L/min  IO: Intake/output summary:   Intake/Output Summary (Last 24 hours) at 05/07/2018 1020 Last data filed at 05/07/2018 0600 Gross per 24 hour  Intake 1535.59 ml  Output 1200 ml  Net 335.59 ml    LBM: Last BM Date: (PTA) Baseline Weight: Weight: 68 kg Most recent weight: Weight: 68 kg     Palliative Assessment/Data:     Time In: 1700 Time Out: 1800 Time Total: 60 Greater than 50%  of this time was spent counseling and coordinating care related to the above assessment and plan.  Signed by: Micheline Rough, MD   Please contact Palliative Medicine  Team phone at [phone] for questions and concerns.  For individual provider: See Shea Evans

## 2018-05-08 ENCOUNTER — Other Ambulatory Visit: Payer: Self-pay | Admitting: Licensed Clinical Social Worker

## 2018-05-08 LAB — BASIC METABOLIC PANEL
ANION GAP: 7 (ref 5–15)
BUN: 17 mg/dL (ref 6–20)
CO2: 22 mmol/L (ref 22–32)
Calcium: 8.5 mg/dL — ABNORMAL LOW (ref 8.9–10.3)
Chloride: 110 mmol/L (ref 98–111)
Creatinine, Ser: 1.06 mg/dL — ABNORMAL HIGH (ref 0.44–1.00)
GFR calc Af Amer: >60 mL/min (ref >60)
GFR calc non Af Amer: 56 mL/min — ABNORMAL LOW (ref >60)
Glucose, Bld: 98 mg/dL (ref 70–99)
Potassium: 3.8 mmol/L (ref 3.5–5.1)
SODIUM: 139 mmol/L (ref 135–145)

## 2018-05-08 LAB — GLUCOSE, CAPILLARY
GLUCOSE-CAPILLARY: 147 mg/dL — AB (ref 70–99)
GLUCOSE-CAPILLARY: 67 mg/dL — AB (ref 70–99)
GLUCOSE-CAPILLARY: 64 mg/dL — AB (ref 70–99)
Glucose-Capillary: 104 mg/dL — ABNORMAL HIGH (ref 70–99)
Glucose-Capillary: 65 mg/dL — ABNORMAL LOW (ref 70–99)
Glucose-Capillary: 69 mg/dL — ABNORMAL LOW (ref 70–99)
Glucose-Capillary: 73 mg/dL (ref 70–99)
Glucose-Capillary: 81 mg/dL (ref 70–99)
Glucose-Capillary: 91 mg/dL (ref 70–99)

## 2018-05-08 MED ORDER — DEXTROSE 50 % IV SOLN
INTRAVENOUS | Status: AC
  Administered 2018-05-08: 50 mL
  Filled 2018-05-08: qty 50

## 2018-05-08 MED ORDER — PANTOPRAZOLE SODIUM 40 MG PO TBEC: 40.0000 mg | DELAYED_RELEASE_TABLET | Freq: Every day | ORAL | 0 refills | Status: DC

## 2018-05-08 MED ORDER — PANTOPRAZOLE SODIUM 40 MG PO TBEC
40.0000 mg | DELAYED_RELEASE_TABLET | Freq: Every day | ORAL | Status: DC
  Administered 2018-05-08: 40 mg via ORAL
  Filled 2018-05-08: qty 1

## 2018-05-08 MED ORDER — DEXTROSE-NACL 5-0.9 % IV SOLN
INTRAVENOUS | Status: DC
  Administered 2018-05-08: 15:00:00 via INTRAVENOUS

## 2018-05-08 MED ORDER — GI COCKTAIL ~~LOC~~
30.0000 mL | Freq: Once | ORAL | Status: AC
  Administered 2018-05-08: 30 mL via ORAL
  Filled 2018-05-08: qty 30

## 2018-05-08 NOTE — Progress Notes (Addendum)
New LOG approved by social work Materials engineer - 30 days room and board and 2 weeks PT/OT at 3x/week. LOG sent to and accepted by facility. Patient's Medicaid still pending. Medicaid application done 04/23/18, pending # [id number]. Patient's Medicaid caseworker is [employer], [phone].  Patient will [facility]. Anticipated discharge date: 05/08/18 Family notified: [name], son Transportation by: [facility]  Nurse to call report to [phone].   CSW signing off.  Abigail Butts, LCSWA  Clinical Social Worker

## 2018-05-08 NOTE — Discharge Summary (Signed)
Physician Discharge Summary  ENAYAH OVERSON UYQ:[id number] DOB: 1956-07-21 DOA: 05/05/2018  PCP: Madelin Headings, MD  Admit date: 05/05/2018 Discharge date: 05/08/2018  Admitted From: SNF Disposition:  SNF  Discharge Condition:Stable CODE STATUS:FULL Diet recommendation: Heart Healthy  Brief/Interim Summary:  Patient is a 62 year old female with past medical history of A. Fib,.  Hypertension, coronary disease status post CABG, hypothyroidism, dementia, hyperlipidemia, COPD, CHF who was sent from [facility] health care for the evaluation of altered mental status.  Patient was found to be hypoglycemic on presentation.  Urinary tract infection was also suspected on presentation.  Patient was also found to have acute kidney injury.   Patient was admitted for the management of hypoglycemia.  Hypoglycemia was suspected secondary to glipizide and metformin she was taking at the skilled nursing facility on the background of acute kidney injury.   Might be associated with poor oral intake. We will permanently discontinue glipizide and metformin.  Her kidney functions improved significantly with IV fluids.  Palliative care was also consulted on this admission regarding discussion of goals of care.    She is stable for discharge back to skilled nursing facility today.  Following problems were addressed during her hospitalization:  Confusion/dementia/acute metabolic encephalopathy : Multifactorial etiology.  Most likely secondary to hypoglycemia, dehydration on this admission.  She has history of recent stroke, dementia.  She was just discharged from here after being admitted for the evaluation of confusion on 04/27/18.  Currently she is oriented to place and person.  She was also able to tell the current year.  She has been evaluated by psychiatrist on her last admission. She could have early and rapidly progressing dementia.  Palliative care was also requested for evaluation to discuss goals of care.Palliative  care discussed with the son.   CT head on 04/15/2018 showed resolving blood products in the left lateral ventricle and encephalomalacia at the site of prior right MCA infarct. Her mental status is back to baseline now.  Hypoglycemia: Patient was on glipizide and metformin at home as per the medical reconciliation report.  Patient most likely became hypoglycemic due to these agents on the background of  acute kidney injury. She was initially started on D10 but snow has been stopped. Last CBG was 91. Her hemoglobin A1c on 7/13 was 5.1.We will discontinue these glipizide and metformin on discharge.  C-peptide came elevated, most likely due to the prolonged effect of these medications on a background of acute kidney injury.  She was given a dose of Solu-Medrol which helped increase the blood glucose.  Her hypoglycemia could also be contributed due to her persistent poor oral intake.  Nutrition should follow her at the skilled nursing facility.  Acute kidney injury: Baseline creatinine ranges from 1.1-1.2.  Presented with creatinine of 2.8.  Kidney function improved with IV fluids.   Bacteremia: 1 of the blood cultures showed methicillin-resistant coagulase negative staph.  Most likely this is contamination.  Repeat blood cultures sent and they are NGTD  Lactic acidosis: Most likely secondary to dehydration.  Resolved with IV fluids.  UTI: Urine culture did not show any growth  Atrial fibrillation: Currently heart rate is controlled.  On her previous medication list as per the discharge summaries, she should be on metoprolol and Cardizem. But current med list does not show  these medications. She is not on any anticoagulation because of history of intraventricular hemorrhage .  Checked with the pharmacy staff who communicated with the nursing home staff and concluded that the  current home medication list is the most recent and appropriate one.  Hypertension: Currently normotensive.    Hypothyroidism: Continue Synthyroid  History of chronic diastolic CHF: Currently euvolemic.  Continue lisinopril.  Last echocardiogram done on 7/19 showed ejection fraction of 50%.  History of COPD: Currently stable.  History of stroke/IVH: History of stroke in 2012.  Recent history of intraventricular hemorrhage in July this year.  Depression: Seen by psychiatry on her last admission.  Was supposed to be on Wellbutrin, Lexapro.  But current med list shows she is on fluoxetine  Discharge Diagnoses:  Principal Problem:   Acute metabolic encephalopathy Active Problems:   Hypothyroidism   Chronic diastolic heart failure (HCC)   HTN (hypertension)   Atrial fibrillation, chronic (HCC)   Acute renal failure (ARF) (HCC)   Hypoglycemia without diagnosis of diabetes mellitus   Dementia   Malnutrition of moderate degree    Discharge Instructions  Discharge Instructions    Diet - low sodium heart healthy   Complete by:  As directed    Discharge instructions   Complete by:  As directed    1) Continue to monitor blood sugars at the skilled nursing facility.  Avoid any kind of antihyperglycemic agents. 2)Take prescribed medications as instructed. 3)Do CBC and BMP tests in a week.   Increase activity slowly   Complete by:  As directed      Allergies as of 05/08/2018      Reactions   Avelox [moxifloxacin Hcl In Nacl] Other (See Comments)   Mental breakdown.   Amoxicillin Hives   Pamelor [nortriptyline Hcl] Other (See Comments)   Made her want to hurt herself   Penicillins Hives      Hydrocodone Itching   Oxycodone Itching   Sulfa Antibiotics Other (See Comments)   unknown      Medication List    STOP taking these medications   glipiZIDE 5 MG tablet Commonly known as:  GLUCOTROL   hydrochlorothiazide 25 MG tablet Commonly known as:  HYDRODIURIL   metFORMIN 1000 MG tablet Commonly known as:  GLUCOPHAGE   methocarbamol 750 MG tablet Commonly known as:   ROBAXIN   traMADol 50 MG tablet Commonly known as:  ULTRAM     TAKE these medications   atorvastatin 40 MG tablet Commonly known as:  LIPITOR Take 1 tablet (40 mg total) by mouth daily at 6 PM. What changed:  how much to take   cetirizine 10 MG tablet Commonly known as:  ZYRTEC Take 10 mg by mouth daily.   dicyclomine 20 MG tablet Commonly known as:  BENTYL Take 20 mg by mouth 2 (two) times daily.   diphenoxylate-atropine 2.5-0.025 MG tablet Commonly known as:  LOMOTIL Take 1 tablet by mouth every 8 (eight) hours as needed for diarrhea or loose stools.   FLUoxetine 20 MG capsule Commonly known as:  PROZAC Take 40 mg by mouth daily.   gabapentin 300 MG capsule Commonly known as:  NEURONTIN Take 300 mg by mouth every 8 (eight) hours.   lisinopril 10 MG tablet Commonly known as:  PRINIVIL,ZESTRIL Take 10 mg by mouth daily.   ondansetron 4 MG tablet Commonly known as:  ZOFRAN Take 4 mg by mouth every 6 (six) hours.   pantoprazole 40 MG tablet Commonly known as:  PROTONIX Take 1 tablet (40 mg total) by mouth daily. Start taking on:  05/09/2018      Contact information for after-discharge care    Destination    [facility] .  Service:  Skilled Nursing Contact information: [address] [place] [address] [phone]             Allergies  Allergen Reactions  . Avelox [Moxifloxacin Hcl In Nacl] Other (See Comments)    Mental breakdown.  [name] Amoxicillin Hives  . Pamelor [Nortriptyline Hcl] Other (See Comments)    Made her want to hurt herself  . Penicillins Hives       . Hydrocodone Itching  . Oxycodone Itching  . Sulfa Antibiotics Other (See Comments)    unknown    Consultations:  Palliative care   Procedures/Studies: Dg Chest 2 View  Result Date: 04/15/2018 CLINICAL DATA:  Unwitnessed fall with facial droop and slurred speech. EXAM: CHEST - 2 VIEW COMPARISON:  03/25/2018 FINDINGS: Patient is status  post CABG. Nonaneurysmal atherosclerotic aorta is noted. There is stable cardiomegaly with diffuse interstitial prominence suspicious for mild interstitial edema. No effusion or pneumothorax. Degenerative changes are present along dorsal spine. Surgical clips project over the right upper thorax. IMPRESSION: Cardiomegaly with aortic atherosclerosis and post CABG change. Mild diffuse interstitial prominence may reflect a component of mild interstitial edema. Electronically Signed   By: Tollie Eth M.D.   On: 04/15/2018 01:23   Ct Angio Chest Pe W And/or Wo Contrast  Result Date: 04/15/2018 CLINICAL DATA:  Shortness of breath. EXAM: CT ANGIOGRAPHY CHEST WITH CONTRAST TECHNIQUE: Multidetector CT imaging of the chest was performed using the standard protocol during bolus administration of intravenous contrast. Multiplanar CT image reconstructions and MIPs were obtained to evaluate the vascular anatomy. CONTRAST:  80mL ISOVUE-370 IOPAMIDOL (ISOVUE-370) INJECTION 76% COMPARISON:  Chest CT 11/16/2010 Chest radiograph 04/15/2018 FINDINGS: Cardiovascular: --Pulmonary arteries: Contrast injection is sufficient to demonstrate satisfactory opacification of the pulmonary arteries to the segmental level. There is no pulmonary embolus. The main pulmonary artery is within normal limits for size. --Aorta: Limited opacification of the aorta due to bolus timing optimization for the pulmonary arteries. Conventional 3 vessel aortic branching pattern. The aortic course and caliber are normal. There is no aortic atherosclerosis. --Heart: Mild cardiomegaly. No pericardial effusion. Mediastinum/Nodes: No mediastinal, hilar or axillary lymphadenopathy. The visualized thyroid and thoracic esophageal course are unremarkable. Lungs/Pleura: No pulmonary nodules or masses. No pleural effusion or pneumothorax. No focal airspace consolidation. No focal pleural abnormality. Upper Abdomen: Contrast bolus timing is not optimized for evaluation of  the abdominal organs. Within this limitation, the visualized organs of the upper abdomen are normal. Musculoskeletal: No chest wall abnormality. No acute or significant osseous findings. Review of the MIP images confirms the above findings. IMPRESSION: 1. No pulmonary embolus. 2. Mild cardiomegaly. 3. No acute thoracic abnormality. Electronically Signed   By: Deatra Robinson M.D.   On: 04/15/2018 05:01   Dg Chest Port 1 View  Result Date: 05/05/2018 CLINICAL DATA:  AMS; Altered from baseline. Pt is alert but unable to accurately give year or month at time of triage. I Pt has hx of CVA with residual left sided weakness. Pt states she usually walks without assistive device. EXAM: PORTABLE CHEST 1 VIEW COMPARISON:  04/15/2018 FINDINGS: Status post CABG. Heart is enlarged. There are mildly prominent interstitial markings, stable over prior studies and likely chronic. No discrete consolidations. No pleural effusions. IMPRESSION: Stable cardiomegaly and prominent interstitial markings. Electronically Signed   By: Norva Pavlov M.D.   On: 05/05/2018 12:51   Ct Head Code Stroke Wo Contrast  Result Date: 04/15/2018 CLINICAL DATA:  Code stroke. Left-sided facial droop and right arm weakness. EXAM: CT  HEAD WITHOUT CONTRAST TECHNIQUE: Contiguous axial images were obtained from the base of the skull through the vertex without intravenous contrast. COMPARISON:  03/25/2018 head CT FINDINGS: Brain: There is no acute hemorrhage or extra-axial collection. There is debris within the left lateral ventricle compatible with resolving blood products. There is generalized atrophy without lobar predilection. Old infarct in the right MCA territory with associated encephalomalacia. There is hypoattenuation of the periventricular white matter, most commonly indicating chronic ischemic microangiopathy. Vascular: No abnormal hyperdensity of the major intracranial arteries or dural venous sinuses. No intracranial atherosclerosis. Skull:  The visualized skull base, calvarium and extracranial soft tissues are normal. Sinuses/Orbits: Moderate ethmoid and maxillary sinus mucosal thickening. The orbits are normal. ASPECTS [facility] Stroke Program Early CT Score) - Ganglionic level infarction (caudate, lentiform nuclei, internal capsule, insula, M1-M3 cortex): 7 - Supraganglionic infarction (M4-M6 cortex): 3 Total score (0-10 with 10 being normal): 10 IMPRESSION: 1. No acute hemorrhage. 2. Resolving blood products in the left lateral ventricle and encephalomalacia at the site of prior right MCA infarct. 3. ASPECTS is 10. These results were communicated to Dr. Georgiana Spinner Aroor at 12:07 am on 04/15/2018 by text page via the [facility] messaging system. Electronically Signed   By: Deatra Robinson M.D.   On: 04/15/2018 00:09       Subjective: Patient seen and examined the bedside this morning.  Complained of some abdominal discomfort this morning.  GI cocktail and Protonix were given.  She is hemodynamically stable.  Last blood sugar checked was 91.  She is  stable for discharge to skilled nursing facility today.  Discharge Exam: Vitals:   05/08/18 0538 05/08/18 0944  BP: (!) 159/89 (!) 147/104  Pulse: 74 89  Resp: 18 18  Temp: (!) 97 F (36.1 C) 98 F (36.7 C)  SpO2: 94% (!) 87%   Vitals:   05/07/18 1815 05/07/18 2022 05/08/18 0538 05/08/18 0944  BP: 137/89 (!) 131/99 (!) 159/89 (!) 147/104  Pulse: 73 80 74 89  Resp: 18 18 18 18   Temp: (!) 97.5 F (36.4 C) 98 F (36.7 C) (!) 97 F (36.1 C) 98 F (36.7 C)  TempSrc: Oral  Oral Oral  SpO2: 94% 100% 94% (!) 87%  Weight:      Height:        General: Pt is alert, awake, not in acute distress Cardiovascular: RRR, S1/S2 +, no rubs, no gallops Respiratory: CTA bilaterally, no wheezing, no rhonchi Abdominal: Soft, NT, ND, bowel sounds + Extremities: no edema, no cyanosis    The results of significant diagnostics from this hospitalization (including imaging, microbiology, ancillary and  laboratory) are listed below for reference.     Microbiology: Recent Results (from the past 240 hour(s))  Blood Culture (routine x 2)     Status: None (Preliminary result)   Collection Time: 05/05/18 12:35 PM  Result Value Ref Range Status   Specimen Description BLOOD LEFT ANTECUBITAL  Final   Special Requests   Final    BOTTLES DRAWN AEROBIC AND ANAEROBIC Blood Culture adequate volume   Culture   Final    NO GROWTH 3 DAYS Performed at [facility], [address], [place], [place] [address]    Report Status PENDING  Incomplete  Blood Culture (routine x 2)     Status: Abnormal (Preliminary result)   Collection Time: 05/05/18  1:13 PM  Result Value Ref Range Status   Specimen Description BLOOD RIGHT ANTECUBITAL  Final   Special Requests   Final  BOTTLES DRAWN AEROBIC AND ANAEROBIC Blood Culture adequate volume   Culture  Setup Time   Final    GRAM POSITIVE COCCI IN CLUSTERS ANAEROBIC BOTTLE ONLY CRITICAL RESULT CALLED TO, READ BACK BY AND VERIFIED WITH: Patrick North PharmD 10:00 05/06/18 ([email or web address])    Culture (A)  Final    STAPHYLOCOCCUS SPECIES (COAGULASE NEGATIVE) THE SIGNIFICANCE OF ISOLATING THIS ORGANISM FROM A SINGLE SET OF BLOOD CULTURES WHEN MULTIPLE SETS ARE DRAWN IS UNCERTAIN. PLEASE NOTIFY THE MICROBIOLOGY DEPARTMENT WITHIN ONE WEEK IF SPECIATION AND SENSITIVITIES ARE REQUIRED. Performed at [facility], [address] [place], [place] [address]    Report Status PENDING  Incomplete  Blood Culture ID Panel (Reflexed)     Status: Abnormal   Collection Time: 05/05/18  1:13 PM  Result Value Ref Range Status   Enterococcus species NOT DETECTED NOT DETECTED Final   Listeria monocytogenes NOT DETECTED NOT DETECTED Final   Staphylococcus species DETECTED (A) NOT DETECTED Final    Comment: Methicillin (oxacillin) resistant coagulase negative staphylococcus. Possible blood culture contaminant (unless isolated from more than one blood culture draw or clinical case  suggests pathogenicity). No antibiotic treatment is indicated for blood  culture contaminants. CRITICAL RESULT CALLED TO, READ BACK BY AND VERIFIED WITH: Patrick North PharmD 10:00 05/06/18 ([email or web address])    Staphylococcus aureus NOT DETECTED NOT DETECTED Final   Methicillin resistance DETECTED (A) NOT DETECTED Final    Comment: CRITICAL RESULT CALLED TO, READ BACK BY AND VERIFIED WITH: Patrick North PharmD 10:00 05/06/18 ([email or web address])    Streptococcus species NOT DETECTED NOT DETECTED Final   Streptococcus agalactiae NOT DETECTED NOT DETECTED Final   Streptococcus pneumoniae NOT DETECTED NOT DETECTED Final   Streptococcus pyogenes NOT DETECTED NOT DETECTED Final   Acinetobacter baumannii NOT DETECTED NOT DETECTED Final   Enterobacteriaceae species NOT DETECTED NOT DETECTED Final   Enterobacter cloacae complex NOT DETECTED NOT DETECTED Final   Escherichia coli NOT DETECTED NOT DETECTED Final   Klebsiella oxytoca NOT DETECTED NOT DETECTED Final   Klebsiella pneumoniae NOT DETECTED NOT DETECTED Final   Proteus species NOT DETECTED NOT DETECTED Final   Serratia marcescens NOT DETECTED NOT DETECTED Final   Haemophilus influenzae NOT DETECTED NOT DETECTED Final   Neisseria meningitidis NOT DETECTED NOT DETECTED Final   Pseudomonas aeruginosa NOT DETECTED NOT DETECTED Final   Candida albicans NOT DETECTED NOT DETECTED Final   Candida glabrata NOT DETECTED NOT DETECTED Final   Candida krusei NOT DETECTED NOT DETECTED Final   Candida parapsilosis NOT DETECTED NOT DETECTED Final   Candida tropicalis NOT DETECTED NOT DETECTED Final    Comment: Performed at [facility], [address], [place], [place] [address]  Urine culture     Status: None   Collection Time: 05/05/18  2:15 PM  Result Value Ref Range Status   Specimen Description URINE, CATHETERIZED  Final   Special Requests NONE  Final   Culture   Final    NO GROWTH Performed at [facility], [address], [place], [place]  [address]    Report Status 05/06/2018 FINAL  Final  MRSA PCR Screening     Status: None   Collection Time: 05/06/18  8:16 AM  Result Value Ref Range Status   MRSA by PCR NEGATIVE NEGATIVE Final    Comment:        The GeneXpert MRSA Assay (FDA approved for NASAL specimens only), is one component of a comprehensive MRSA colonization surveillance program. It is not intended to  diagnose MRSA infection nor to guide or monitor treatment for MRSA infections. Performed at [facility], [address] [place], [place] [address]   Culture, blood (routine x 2)     Status: None (Preliminary result)   Collection Time: 05/06/18 10:44 AM  Result Value Ref Range Status   Specimen Description BLOOD BLOOD LEFT FOREARM  Final   Special Requests   Final    BOTTLES DRAWN AEROBIC AND ANAEROBIC Blood Culture results may not be optimal due to an inadequate volume of blood received in culture bottles   Culture   Final    NO GROWTH 2 DAYS Performed at [facility], [address] [place], [place] [address]    Report Status PENDING  Incomplete  Culture, blood (routine x 2)     Status: None (Preliminary result)   Collection Time: 05/06/18 10:45 AM  Result Value Ref Range Status   Specimen Description BLOOD LEFT ANTECUBITAL  Final   Special Requests   Final    BOTTLES DRAWN AEROBIC AND ANAEROBIC Blood Culture adequate volume   Culture   Final    NO GROWTH 2 DAYS Performed at [facility], [address] [place], [place] [address]    Report Status PENDING  Incomplete     Labs: BNP (last 3 results) Recent Labs    03/25/18 2016 05/05/18 1235  BNP 608.8* 103.4*   Basic Metabolic Panel: Recent Labs  Lab 05/05/18 1214 05/06/18 0324 05/07/18 0320 05/08/18 0619  NA 134* 135 133* 139  K 3.3* 3.8 3.9 3.8  CL 100 106 101 110  CO2 22 19* 23 22  GLUCOSE 96 117* 211* 98  BUN 53* 39* 23* 17  CREATININE 2.83* 1.88* 1.27* 1.06*  CALCIUM 8.7* 8.3* 8.4* 8.5*   Liver Function  Tests: Recent Labs  Lab 05/05/18 1214  AST 28  ALT 16  ALKPHOS 63  BILITOT 0.9  PROT 5.9*  ALBUMIN 2.6*   No results for input(s): LIPASE, AMYLASE in the last 168 hours. No results for input(s): AMMONIA in the last 168 hours. CBC: Recent Labs  Lab 05/05/18 1235 05/06/18 0324  WBC 6.3 6.2  NEUTROABS 5.2  --   HGB 12.7 11.7*  HCT 38.6 36.6  MCV 100.0 102.5*  PLT 240 202   Cardiac Enzymes: No results for input(s): CKTOTAL, CKMB, CKMBINDEX, TROPONINI in the last 168 hours. BNP: Invalid input(s): POCBNP CBG: Recent Labs  Lab 05/08/18 0642 05/08/18 0741 05/08/18 1157 05/08/18 1345 05/08/18 1500  GLUCAP 65* 147* 67* 64* 91   D-Dimer No results for input(s): DDIMER in the last 72 hours. Hgb A1c No results for input(s): HGBA1C in the last 72 hours. Lipid Profile No results for input(s): CHOL, HDL, LDLCALC, TRIG, CHOLHDL, LDLDIRECT in the last 72 hours. Thyroid function studies No results for input(s): TSH, T4TOTAL, T3FREE, THYROIDAB in the last 72 hours.  Invalid input(s): FREET3 Anemia work up No results for input(s): VITAMINB12, FOLATE, FERRITIN, TIBC, IRON, RETICCTPCT in the last 72 hours. Urinalysis    Component Value Date/Time   COLORURINE YELLOW 05/05/2018 1235   APPEARANCEUR HAZY (A) 05/05/2018 1235   LABSPEC 1.016 05/05/2018 1235   PHURINE 5.0 05/05/2018 1235   GLUCOSEU NEGATIVE 05/05/2018 1235   HGBUR NEGATIVE 05/05/2018 1235   BILIRUBINUR NEGATIVE 05/05/2018 1235   BILIRUBINUR neg 05/11/2014 2056   KETONESUR NEGATIVE 05/05/2018 1235   PROTEINUR NEGATIVE 05/05/2018 1235   UROBILINOGEN 1.0 04/01/2015 1530   NITRITE NEGATIVE 05/05/2018 1235   LEUKOCYTESUR  SMALL (A) 05/05/2018 1235   Sepsis Labs Invalid input(s): PROCALCITONIN,  WBC,  LACTICIDVEN Microbiology Recent Results (from the past 240 hour(s))  Blood Culture (routine x 2)     Status: None (Preliminary result)   Collection Time: 05/05/18 12:35 PM  Result Value Ref Range Status   Specimen  Description BLOOD LEFT ANTECUBITAL  Final   Special Requests   Final    BOTTLES DRAWN AEROBIC AND ANAEROBIC Blood Culture adequate volume   Culture   Final    NO GROWTH 3 DAYS Performed at [facility], [address] [place], [place] [address]    Report Status PENDING  Incomplete  Blood Culture (routine x 2)     Status: Abnormal (Preliminary result)   Collection Time: 05/05/18  1:13 PM  Result Value Ref Range Status   Specimen Description BLOOD RIGHT ANTECUBITAL  Final   Special Requests   Final    BOTTLES DRAWN AEROBIC AND ANAEROBIC Blood Culture adequate volume   Culture  Setup Time   Final    GRAM POSITIVE COCCI IN CLUSTERS ANAEROBIC BOTTLE ONLY CRITICAL RESULT CALLED TO, READ BACK BY AND VERIFIED WITH: Patrick North PharmD 10:00 05/06/18 ([email or web address])    Culture (A)  Final    STAPHYLOCOCCUS SPECIES (COAGULASE NEGATIVE) THE SIGNIFICANCE OF ISOLATING THIS ORGANISM FROM A SINGLE SET OF BLOOD CULTURES WHEN MULTIPLE SETS ARE DRAWN IS UNCERTAIN. PLEASE NOTIFY THE MICROBIOLOGY DEPARTMENT WITHIN ONE WEEK IF SPECIATION AND SENSITIVITIES ARE REQUIRED. Performed at [facility], [address] [place], [place] [address]    Report Status PENDING  Incomplete  Blood Culture ID Panel (Reflexed)     Status: Abnormal   Collection Time: 05/05/18  1:13 PM  Result Value Ref Range Status   Enterococcus species NOT DETECTED NOT DETECTED Final   Listeria monocytogenes NOT DETECTED NOT DETECTED Final   Staphylococcus species DETECTED (A) NOT DETECTED Final    Comment: Methicillin (oxacillin) resistant coagulase negative staphylococcus. Possible blood culture contaminant (unless isolated from more than one blood culture draw or clinical case suggests pathogenicity). No antibiotic treatment is indicated for blood  culture contaminants. CRITICAL RESULT CALLED TO, READ BACK BY AND VERIFIED WITH: Patrick North PharmD 10:00 05/06/18 ([email or web address])    Staphylococcus aureus NOT DETECTED NOT DETECTED  Final   Methicillin resistance DETECTED (A) NOT DETECTED Final    Comment: CRITICAL RESULT CALLED TO, READ BACK BY AND VERIFIED WITH: Patrick North PharmD 10:00 05/06/18 ([email or web address])    Streptococcus species NOT DETECTED NOT DETECTED Final   Streptococcus agalactiae NOT DETECTED NOT DETECTED Final   Streptococcus pneumoniae NOT DETECTED NOT DETECTED Final   Streptococcus pyogenes NOT DETECTED NOT DETECTED Final   Acinetobacter baumannii NOT DETECTED NOT DETECTED Final   Enterobacteriaceae species NOT DETECTED NOT DETECTED Final   Enterobacter cloacae complex NOT DETECTED NOT DETECTED Final   Escherichia coli NOT DETECTED NOT DETECTED Final   Klebsiella oxytoca NOT DETECTED NOT DETECTED Final   Klebsiella pneumoniae NOT DETECTED NOT DETECTED Final   Proteus species NOT DETECTED NOT DETECTED Final   Serratia marcescens NOT DETECTED NOT DETECTED Final   Haemophilus influenzae NOT DETECTED NOT DETECTED Final   Neisseria meningitidis NOT DETECTED NOT DETECTED Final   Pseudomonas aeruginosa NOT DETECTED NOT DETECTED Final   Candida albicans NOT DETECTED NOT DETECTED Final   Candida glabrata NOT DETECTED NOT DETECTED Final   Candida krusei NOT DETECTED NOT DETECTED Final   Candida parapsilosis NOT DETECTED NOT DETECTED Final   Candida tropicalis NOT DETECTED  NOT DETECTED Final    Comment: Performed at [facility], [address], [place], [place] [address]  Urine culture     Status: None   Collection Time: 05/05/18  2:15 PM  Result Value Ref Range Status   Specimen Description URINE, CATHETERIZED  Final   Special Requests NONE  Final   Culture   Final    NO GROWTH Performed at [facility], [address] [place], [place] [address]    Report Status 05/06/2018 FINAL  Final  MRSA PCR Screening     Status: None   Collection Time: 05/06/18  8:16 AM  Result Value Ref Range Status   MRSA by PCR NEGATIVE NEGATIVE Final    Comment:        The GeneXpert MRSA Assay (FDA approved  for NASAL specimens only), is one component of a comprehensive MRSA colonization surveillance program. It is not intended to diagnose MRSA infection nor to guide or monitor treatment for MRSA infections. Performed at [facility], [address] [place], [place] [address]   Culture, blood (routine x 2)     Status: None (Preliminary result)   Collection Time: 05/06/18 10:44 AM  Result Value Ref Range Status   Specimen Description BLOOD BLOOD LEFT FOREARM  Final   Special Requests   Final    BOTTLES DRAWN AEROBIC AND ANAEROBIC Blood Culture results may not be optimal due to an inadequate volume of blood received in culture bottles   Culture   Final    NO GROWTH 2 DAYS Performed at [facility], [address], [place], [place] [address]    Report Status PENDING  Incomplete  Culture, blood (routine x 2)     Status: None (Preliminary result)   Collection Time: 05/06/18 10:45 AM  Result Value Ref Range Status   Specimen Description BLOOD LEFT ANTECUBITAL  Final   Special Requests   Final    BOTTLES DRAWN AEROBIC AND ANAEROBIC Blood Culture adequate volume   Culture   Final    NO GROWTH 2 DAYS Performed at [facility], [address] [place], [place] [address]    Report Status PENDING  Incomplete    Please note: You were cared for by a hospitalist during your hospital stay. Once you are discharged, your primary care physician will handle any further medical issues. Please note that NO REFILLS for any discharge medications will be authorized once you are discharged, as it is imperative that you return to your primary care physician (or establish a relationship with a primary care physician if you do not have one) for your post hospital discharge needs so that they can reassess your need for medications and monitor your lab values.    Time coordinating discharge: 40 minutes  SIGNED:   Burnadette Pop, MD  [facility] 05/08/2018, 3:10 PM Pager  [phone]  If 7PM-7AM, please contact night-coverage [email or web address] Password [email or web address]

## 2018-05-08 NOTE — Progress Notes (Signed)
PROGRESS NOTE    [name]  HFS:[id number] DOB: [birth year] DOA: 05/05/2018 PCP: Madelin Headings, MD   Brief Narrative: Patient is a 62 year old female with past medical history of A. Fib,.  Hypertension, coronary disease status post CABG, hypothyroidism, dementia, hyperlipidemia, COPD, CHF who was sent from [facility] health care for the evaluation of altered mental status.  Patient was found to be hypoglycemic on presentation.  Urinary tract infection was also suspected on presentation.  Patient was also found to have acute kidney injury.  Her hospital course has remained stable.  Hypoglycemia was suspected secondary to glipizide and metformin she was taking at the skilled nursing facility on the background of acute kidney injury.  We will permanently discontinue glipizide and metformin.  Her kidney functions improved significantly with IV fluids.  Palliative care was also consulted on this admission regarding discussion of goals of care.    Assessment & Plan:   Principal Problem:   Acute metabolic encephalopathy Active Problems:   Hypothyroidism   Chronic diastolic heart failure (HCC)   HTN (hypertension)   Atrial fibrillation, chronic (HCC)   Acute renal failure (ARF) (HCC)   Hypoglycemia without diagnosis of diabetes mellitus   Dementia   Malnutrition of moderate degree   Confusion/dementia/acute metabolic encephalopathy ?: Multifactorial etiology.  Most likely secondary to hypoglycemia, dehydration on this admission.  She has history of recent stroke, dementia.  She was just discharged from here after being managed for the same problem on 04/27/18.  Currently she is oriented to place and person.  She was also able to tell the current year.  She has been evaluated by psychiatrist on her last admission. She could have early and rapidly progressing dementia.  Palliative care was also requested for evaluation to discuss goals of care.Palliative care discussed with the son.   CT head on  04/15/2018 showed resolving blood products in the left lateral ventricle and encephalomalacia at the site of prior right MCA infarct. Her mental status is back to baseline now.  Hypoglycemia: Patient was on glipizide and metformin at home as per the medical reconciliation report.  Patient most likely became hypoglycemic due to these agents on the background of  acute kidney injury. D10 stopped. Patient's blood sugars have remained on the lower side   this morning.  We will continue to monitor CBG.  Her hemoglobin A1c on 7/13 was 5.1.We will discontinue these glipizide and metformin on discharge.  C-peptide came elevated.  She was given a dose of Solu-Medrol which helped increase the blood glucose.  Acute kidney injury: Baseline creatinine ranges from 1.1-1.2.  Presented with creatinine of 2.8.  Kidney function improved with IV fluids.  We will continue to monitor her BMP.  Bacteremia: 1 of the blood cultures showed methicillin-resistant coagulase negative staph.  Most likely this is contamination.  Repeat blood cultures sent and they are NGTD  Lactic acidosis: Most likely secondary to dehydration.  Resolved with IV fluids.  UTI: Urine culture did not show any growth  Atrial fibrillation: Currently heart rate is controlled.  On her previous medication list as per the discharge summaries, she should be on metoprolol and Cardizem. But current med list does not show  these medications. She is not on any anticoagulation because of history of intraventricular hemorrhage .  Checked with the pharmacy staff who communicated with the nursing home staff and concluded that the current home medication list is the most recent and appropriate one.  Hypertension: Currently normotensive.   Hypothyroidism: Continue Synthyroid  History of chronic diastolic CHF: Currently euvolemic.  Continue current medications.  History of COPD: Currently stable.  History of stroke/IVH: History of stroke in 2012.  Recent history  of intraventricular hemorrhage in July this year.  Depression: Seen by psychiatry on her last admission.  Was supposed to be on Wellbutrin, Lexapro.  But current med list shows she is on fluoxetine   DVT prophylaxis: SCD Code Status: Full Family Communication: None present at the bedside Disposition Plan: Back to skilled nursing facility tomorrow   Consultants: None  Procedures:None  Antimicrobials:None  Subjective: Patient seen and examined at bedside this morning.  Remains comfortable.  Her mental status has improved remains stable.  Hemodynamically stable.  She was plan for discharge today to skilled nursing facility but her blood sugars are consistently in the range of 60s this morning.  Will hold the discharge process for today.  Plan for tomorrow.  Objective: Vitals:   05/07/18 1815 05/07/18 2022 05/08/18 0538 05/08/18 0944  BP: 137/89 (!) 131/99 (!) 159/89 (!) 147/104  Pulse: 73 80 74 89  Resp: 18 18 18 18   Temp: (!) 97.5 F (36.4 C) 98 F (36.7 C) (!) 97 F (36.1 C) 98 F (36.7 C)  TempSrc: Oral  Oral Oral  SpO2: 94% 100% 94% (!) 87%  Weight:      Height:        Intake/Output Summary (Last 24 hours) at 05/08/2018 1449 Last data filed at 05/08/2018 1246 Gross per 24 hour  Intake 1975.02 ml  Output 2300 ml  Net -324.98 ml   Filed Weights   05/05/18 2311 05/06/18 2100  Weight: 68 kg 68 kg    Examination:  General exam: Appears calm and comfortable ,Not in distress,average built HEENT:PERRL,Oral mucosa moist, Ear/Nose normal on gross exam Respiratory system: Bilateral equal air entry, normal vesicular breath sounds, no wheezes or crackles  Cardiovascular system: S1 & S2 heard, RRR. No JVD, murmurs, rubs, gallops or clicks. No pedal edema. Gastrointestinal system: Abdomen is nondistended, soft and nontender. No organomegaly or masses felt. Normal bowel sounds heard. Central nervous system: Alert and oriented to place and person.  Could tell current year.  No  focal neurological deficits. Extremities: No edema, no clubbing ,no cyanosis, distal peripheral pulses palpable. Skin: No rashes, lesions or ulcers,no icterus ,no pallor   Data Reviewed: I have personally reviewed following labs and imaging studies  CBC: Recent Labs  Lab 05/05/18 1235 05/06/18 0324  WBC 6.3 6.2  NEUTROABS 5.2  --   HGB 12.7 11.7*  HCT 38.6 36.6  MCV 100.0 102.5*  PLT 240 202   Basic Metabolic Panel: Recent Labs  Lab 05/05/18 1214 05/06/18 0324 05/07/18 0320 05/08/18 0619  NA 134* 135 133* 139  K 3.3* 3.8 3.9 3.8  CL 100 106 101 110  CO2 22 19* 23 22  GLUCOSE 96 117* 211* 98  BUN 53* 39* 23* 17  CREATININE 2.83* 1.88* 1.27* 1.06*  CALCIUM 8.7* 8.3* 8.4* 8.5*   GFR: Estimated Creatinine Clearance: 53.5 mL/min (A) (by C-G formula based on SCr of 1.06 mg/dL (H)). Liver Function Tests: Recent Labs  Lab 05/05/18 1214  AST 28  ALT 16  ALKPHOS 63  BILITOT 0.9  PROT 5.9*  ALBUMIN 2.6*   No results for input(s): LIPASE, AMYLASE in the last 168 hours. No results for input(s): AMMONIA in the last 168 hours. Coagulation Profile: No results for input(s): INR, PROTIME in the last 168 hours. Cardiac Enzymes: No results for input(s): CKTOTAL, CKMB,  CKMBINDEX, TROPONINI in the last 168 hours. BNP (last 3 results) No results for input(s): PROBNP in the last 8760 hours. HbA1C: No results for input(s): HGBA1C in the last 72 hours. CBG: Recent Labs  Lab 05/08/18 0231 05/08/18 0642 05/08/18 0741 05/08/18 1157 05/08/18 1345  GLUCAP 81 65* 147* 67* 64*   Lipid Profile: No results for input(s): CHOL, HDL, LDLCALC, TRIG, CHOLHDL, LDLDIRECT in the last 72 hours. Thyroid Function Tests: No results for input(s): TSH, T4TOTAL, FREET4, T3FREE, THYROIDAB in the last 72 hours. Anemia Panel: No results for input(s): VITAMINB12, FOLATE, FERRITIN, TIBC, IRON, RETICCTPCT in the last 72 hours. Sepsis Labs: Recent Labs  Lab 05/05/18 1246 05/05/18 1436    LATICACIDVEN 2.01* 1.21    Recent Results (from the past 240 hour(s))  Blood Culture (routine x 2)     Status: None (Preliminary result)   Collection Time: 05/05/18 12:35 PM  Result Value Ref Range Status   Specimen Description BLOOD LEFT ANTECUBITAL  Final   Special Requests   Final    BOTTLES DRAWN AEROBIC AND ANAEROBIC Blood Culture adequate volume   Culture   Final    NO GROWTH 3 DAYS Performed at [facility], [address], [place], [place] [address]    Report Status PENDING  Incomplete  Blood Culture (routine x 2)     Status: Abnormal (Preliminary result)   Collection Time: 05/05/18  1:13 PM  Result Value Ref Range Status   Specimen Description BLOOD RIGHT ANTECUBITAL  Final   Special Requests   Final    BOTTLES DRAWN AEROBIC AND ANAEROBIC Blood Culture adequate volume   Culture  Setup Time   Final    GRAM POSITIVE COCCI IN CLUSTERS ANAEROBIC BOTTLE ONLY CRITICAL RESULT CALLED TO, READ BACK BY AND VERIFIED WITH: Patrick North PharmD 10:00 05/06/18 ([email or web address])    Culture (A)  Final    STAPHYLOCOCCUS SPECIES (COAGULASE NEGATIVE) THE SIGNIFICANCE OF ISOLATING THIS ORGANISM FROM A SINGLE SET OF BLOOD CULTURES WHEN MULTIPLE SETS ARE DRAWN IS UNCERTAIN. PLEASE NOTIFY THE MICROBIOLOGY DEPARTMENT WITHIN ONE WEEK IF SPECIATION AND SENSITIVITIES ARE REQUIRED. Performed at [facility], [address], [place], [place] [address]    Report Status PENDING  Incomplete  Blood Culture ID Panel (Reflexed)     Status: Abnormal   Collection Time: 05/05/18  1:13 PM  Result Value Ref Range Status   Enterococcus species NOT DETECTED NOT DETECTED Final   Listeria monocytogenes NOT DETECTED NOT DETECTED Final   Staphylococcus species DETECTED (A) NOT DETECTED Final    Comment: Methicillin (oxacillin) resistant coagulase negative staphylococcus. Possible blood culture contaminant (unless isolated from more than one blood culture draw or clinical case suggests pathogenicity). No  antibiotic treatment is indicated for blood  culture contaminants. CRITICAL RESULT CALLED TO, READ BACK BY AND VERIFIED WITH: Patrick North PharmD 10:00 05/06/18 ([email or web address])    Staphylococcus aureus NOT DETECTED NOT DETECTED Final   Methicillin resistance DETECTED (A) NOT DETECTED Final    Comment: CRITICAL RESULT CALLED TO, READ BACK BY AND VERIFIED WITH: Patrick North PharmD 10:00 05/06/18 ([email or web address])    Streptococcus species NOT DETECTED NOT DETECTED Final   Streptococcus agalactiae NOT DETECTED NOT DETECTED Final   Streptococcus pneumoniae NOT DETECTED NOT DETECTED Final   Streptococcus pyogenes NOT DETECTED NOT DETECTED Final   Acinetobacter baumannii NOT DETECTED NOT DETECTED Final   Enterobacteriaceae species NOT DETECTED NOT DETECTED Final   Enterobacter cloacae complex NOT DETECTED NOT DETECTED Final   Escherichia coli NOT  DETECTED NOT DETECTED Final   Klebsiella oxytoca NOT DETECTED NOT DETECTED Final   Klebsiella pneumoniae NOT DETECTED NOT DETECTED Final   Proteus species NOT DETECTED NOT DETECTED Final   Serratia marcescens NOT DETECTED NOT DETECTED Final   Haemophilus influenzae NOT DETECTED NOT DETECTED Final   Neisseria meningitidis NOT DETECTED NOT DETECTED Final   Pseudomonas aeruginosa NOT DETECTED NOT DETECTED Final   Candida albicans NOT DETECTED NOT DETECTED Final   Candida glabrata NOT DETECTED NOT DETECTED Final   Candida krusei NOT DETECTED NOT DETECTED Final   Candida parapsilosis NOT DETECTED NOT DETECTED Final   Candida tropicalis NOT DETECTED NOT DETECTED Final    Comment: Performed at [facility], [address], [place], [place] [address]  Urine culture     Status: None   Collection Time: 05/05/18  2:15 PM  Result Value Ref Range Status   Specimen Description URINE, CATHETERIZED  Final   Special Requests NONE  Final   Culture   Final    NO GROWTH Performed at [facility], [address], [place], [place] [address]    Report Status  05/06/2018 FINAL  Final  MRSA PCR Screening     Status: None   Collection Time: 05/06/18  8:16 AM  Result Value Ref Range Status   MRSA by PCR NEGATIVE NEGATIVE Final    Comment:        The GeneXpert MRSA Assay (FDA approved for NASAL specimens only), is one component of a comprehensive MRSA colonization surveillance program. It is not intended to diagnose MRSA infection nor to guide or monitor treatment for MRSA infections. Performed at [facility], [address] [place], [place] [address]   Culture, blood (routine x 2)     Status: None (Preliminary result)   Collection Time: 05/06/18 10:44 AM  Result Value Ref Range Status   Specimen Description BLOOD BLOOD LEFT FOREARM  Final   Special Requests   Final    BOTTLES DRAWN AEROBIC AND ANAEROBIC Blood Culture results may not be optimal due to an inadequate volume of blood received in culture bottles   Culture   Final    NO GROWTH 2 DAYS Performed at [facility], [address], [place], [place] [address]    Report Status PENDING  Incomplete  Culture, blood (routine x 2)     Status: None (Preliminary result)   Collection Time: 05/06/18 10:45 AM  Result Value Ref Range Status   Specimen Description BLOOD LEFT ANTECUBITAL  Final   Special Requests   Final    BOTTLES DRAWN AEROBIC AND ANAEROBIC Blood Culture adequate volume   Culture   Final    NO GROWTH 2 DAYS Performed at [facility], [address], [place], [place] [address]    Report Status PENDING  Incomplete         Radiology Studies: No results found.      Scheduled Meds: . atorvastatin  40 mg Oral q1800  . feeding supplement (ENSURE ENLIVE)  237 mL Oral BID BM  . magnesium oxide  400 mg Oral Daily  . pantoprazole  40 mg Oral Daily   Continuous Infusions: . sodium chloride 75 mL/hr at 05/08/18 1020     LOS: 3 days    Time spent: 25 mins.More than 50% of that time was spent in counseling and/or coordination of  care.      Burnadette Pop, MD [facility] Pager [phone]  If 7PM-7AM, please  contact night-coverage [email or web address] Password [email or web address] 05/08/2018, 2:49 PM

## 2018-05-08 NOTE — Progress Notes (Signed)
Report given to Reggie at [facility]. Waiting for [facility] to transport pt.

## 2018-05-08 NOTE — Clinical Social Work Placement (Signed)
   CLINICAL SOCIAL WORK PLACEMENT  NOTE  Date:  05/08/2018  Patient Details  Name: [name] MRN: [id number] Date of Birth: 12/14/1955  Clinical Social Work is seeking post-discharge placement for this patient at the Skilled  Nursing Facility level of care (*CSW will initial, date and re-position this form in  chart as items are completed):  Yes   Patient/family provided with [place] Clinical Social Work Department's list of facilities offering this level of care within the geographic area requested by the patient (or if unable, by the patient's family).  Yes   Patient/family informed of their freedom to choose among providers that offer the needed level of care, that participate in Medicare, Medicaid or managed care program needed by the patient, have an available bed and are willing to accept the patient.  Yes   Patient/family informed of [facility]'s ownership interest in [facility] and [facility], as well as of the fact that they are under no obligation to receive care at these facilities.  PASRR submitted to EDS on       PASRR number received on       Existing PASRR number confirmed on 05/06/18     FL2 transmitted to all facilities in geographic area requested by pt/family on 05/06/18     FL2 transmitted to all facilities within larger geographic area on       Patient informed that his/her managed care company has contracts with or will negotiate with certain facilities, including the following:  [facility])         Patient/family informed of bed offers received.  Patient chooses bed at [facility]     Physician recommends and patient chooses bed at      Patient to be transferred to [facility]) on 05/08/18.  Patient to be transferred to facility by [facility]     Patient family notified on 05/08/18 of transfer.  Name of family member  notified:  [name], son     PHYSICIAN       Additional Comment:    _______________________________________________ Abigail Butts, LCSW 05/08/2018, 3:24 PM

## 2018-05-08 NOTE — Progress Notes (Signed)
Encouraged patient to drink or eat due to her CBG's are on the borderline low side. Told patient to drink more ensure and she stated she does not like to be force fed. Told patient will be back later to help her drink her ensure. Patient have a very poor appetite. Not in distress. Seen watching TV in her room.

## 2018-05-08 NOTE — Progress Notes (Signed)
Hypoglycemic Event  CBG: Results for CONYA, FREERKSEN (MRN [id number]) as of 05/08/2018 06:55  Ref. Range 05/08/2018 06:42  Glucose-Capillary Latest Ref Range: 70 - 99 mg/dL 65 (L)    Treatment: Y07 IV 50 mL  Symptoms: None  Follow-up CBG: Time: CBG Result:Results for GWENDALYN, CAPONE (MRN [id number]) as of 05/08/2018 20:46  Ref. Range 05/08/2018 07:41  Glucose-Capillary Latest Ref Range: 70 - 99 mg/dL 854 (H)    Possible Reasons for Event: Inadequate meal intake  Comments/MD notified:Refused to drink anything     Carmin Muskrat

## 2018-05-08 NOTE — Patient Outreach (Signed)
[facility]) Care Management  05/08/2018  ALEXANDERIA OBAS [birth year] [id number]  [facility] CSW received incoming call from inpatient CSW Abigail Butts and discussed case. Inpatient is needing confirmation that family is actively working with caseworker in order for patient to gain Medicaid. [facility] CSW completed call to DSS and was able to locate [facility] eligibility caseworker Melissa Gentle at number [phone] but was unable to reach her successfully. [facility] CSW left a voice message encouraging a return call to either this [facility] CSW or inpatient CSW Abigail Butts with requested information in order to gain extension on LOG.  Dickie La, BSW, MSW, LCSW [facility] [email or web address] Phone: [phone] Fax: [phone]

## 2018-05-08 NOTE — Progress Notes (Signed)
CSW awaiting call back from Melissa Gentle at DSS regarding patient's Medicaid application [phone]). CSW has consulted with clinical supervisor regarding new LOG for patient to return to [facility]. Will need to confirm status of patient's Medicaid application before LOG can be approved. CSW to follow.  Abigail Butts, LCSWA [phone]

## 2018-05-09 LAB — CULTURE, BLOOD (ROUTINE X 2): Special Requests: ADEQUATE

## 2018-05-10 LAB — CULTURE, BLOOD (ROUTINE X 2)
Culture: NO GROWTH
Special Requests: ADEQUATE

## 2018-05-11 LAB — CULTURE, BLOOD (ROUTINE X 2)
Culture: NO GROWTH
Culture: NO GROWTH
SPECIAL REQUESTS: ADEQUATE

## 2018-05-11 NOTE — Care Management Important Message (Signed)
Important Message  Patient Details  Name: [name] MRN: [id number] Date of Birth: 1955/10/20   Medicare Important Message Given:  Yes    Dorena Bodo 05/11/2018, 8:27 AM

## 2018-05-13 DIAGNOSIS — E119 Type 2 diabetes mellitus without complications: Secondary | ICD-10-CM | POA: Diagnosis not present

## 2018-05-13 DIAGNOSIS — K219 Gastro-esophageal reflux disease without esophagitis: Secondary | ICD-10-CM | POA: Diagnosis not present

## 2018-05-13 DIAGNOSIS — I6389 Other cerebral infarction: Secondary | ICD-10-CM | POA: Diagnosis not present

## 2018-05-13 DIAGNOSIS — I10 Essential (primary) hypertension: Secondary | ICD-10-CM | POA: Diagnosis not present

## 2018-05-14 ENCOUNTER — Other Ambulatory Visit: Payer: Self-pay | Admitting: Licensed Clinical Social Worker

## 2018-05-14 DIAGNOSIS — Z79899 Other long term (current) drug therapy: Secondary | ICD-10-CM | POA: Diagnosis not present

## 2018-05-14 DIAGNOSIS — E119 Type 2 diabetes mellitus without complications: Secondary | ICD-10-CM | POA: Diagnosis not present

## 2018-05-14 DIAGNOSIS — G300 Alzheimer's disease with early onset: Secondary | ICD-10-CM | POA: Diagnosis not present

## 2018-05-14 DIAGNOSIS — J449 Chronic obstructive pulmonary disease, unspecified: Secondary | ICD-10-CM | POA: Diagnosis not present

## 2018-05-14 DIAGNOSIS — D649 Anemia, unspecified: Secondary | ICD-10-CM | POA: Diagnosis not present

## 2018-05-14 DIAGNOSIS — G9341 Metabolic encephalopathy: Secondary | ICD-10-CM | POA: Diagnosis not present

## 2018-05-14 NOTE — Patient Outreach (Signed)
[facility]) Care Management  05/14/2018  [name] 1955-09-30 [id number]  [facility] CSW arrived at [facility] and successfully completed [facility] consult and coordination. Patient reports that she has been sleeping well but has still been experiencing severe neck pain. Patient reports that her appetite remains normal and that she has been eating good. Patient shares that she was unable to participate in PT this morning because she felt disoriented and that PT will come back later. [facility] CSW met with PT Manager and was informed that patient has really not been able to make much progress in therapy and was only approved for 3 days of PT per week for 2 weeks. PT Manager reports that patient cannot get up at all and will need LTC placement. [facility] CSW met with SNF social worker briefly. [facility] CSW informed her that patient was deemed incompetent during one of her hospital admissions as she was not aware of this.   [facility] CSW will follow up within two weeks and will continue to follow patient closely during her SNF stay.  Eula Fried, BSW, MSW, [name] Phone: [phone] Fax: [phone]

## 2018-05-18 DIAGNOSIS — I6389 Other cerebral infarction: Secondary | ICD-10-CM | POA: Diagnosis not present

## 2018-05-18 DIAGNOSIS — I482 Chronic atrial fibrillation: Secondary | ICD-10-CM | POA: Diagnosis not present

## 2018-05-18 DIAGNOSIS — E119 Type 2 diabetes mellitus without complications: Secondary | ICD-10-CM | POA: Diagnosis not present

## 2018-05-18 DIAGNOSIS — I10 Essential (primary) hypertension: Secondary | ICD-10-CM | POA: Diagnosis not present

## 2018-05-22 LAB — SULFONYLUREA HYPOGLYCEMICS PANEL, SERUM
Acetohexamide: NEGATIVE ug/mL (ref 20–60)
Chlorpropamide: NEGATIVE ug/mL (ref 75–250)
GLIMEPIRIDE: NEGATIVE ng/mL (ref 80–250)
Glipizide: NEGATIVE ng/mL (ref 200–1000)
Glyburide: NEGATIVE ng/mL
Nateglinide: NEGATIVE ng/mL
REPAGLINIDE: NEGATIVE ng/mL
TOLBUTAMIDE: NEGATIVE ug/mL (ref 40–100)
Tolazamide: NEGATIVE ug/mL

## 2018-05-25 DIAGNOSIS — I10 Essential (primary) hypertension: Secondary | ICD-10-CM | POA: Diagnosis not present

## 2018-05-25 DIAGNOSIS — E119 Type 2 diabetes mellitus without complications: Secondary | ICD-10-CM | POA: Diagnosis not present

## 2018-05-25 DIAGNOSIS — I6389 Other cerebral infarction: Secondary | ICD-10-CM | POA: Diagnosis not present

## 2018-05-25 DIAGNOSIS — I482 Chronic atrial fibrillation: Secondary | ICD-10-CM | POA: Diagnosis not present

## 2018-05-26 ENCOUNTER — Other Ambulatory Visit: Payer: Self-pay | Admitting: Licensed Clinical Social Worker

## 2018-05-26 NOTE — Patient Outreach (Signed)
  [facility]) Care Management  [facility] Social Work  05/26/2018  [name] Apr 15, 1956 [id number]  Encounter Medications:  Outpatient Encounter Medications as of 05/26/2018  Medication Sig  . atorvastatin (LIPITOR) 40 MG tablet Take 1 tablet (40 mg total) by mouth daily at 6 PM. (Patient taking differently: Take 20 mg by mouth daily at 6 PM. )  . cetirizine (ZYRTEC) 10 MG tablet Take 10 mg by mouth daily.  [name] dicyclomine (BENTYL) 20 MG tablet Take 20 mg by mouth 2 (two) times daily.  . diphenoxylate-atropine (LOMOTIL) 2.5-0.025 MG tablet Take 1 tablet by mouth every 8 (eight) hours as needed for diarrhea or loose stools.  [name] FLUoxetine (PROZAC) 20 MG capsule Take 40 mg by mouth daily.  [name] gabapentin (NEURONTIN) 300 MG capsule Take 300 mg by mouth every 8 (eight) hours.  [name] lisinopril (PRINIVIL,ZESTRIL) 10 MG tablet Take 10 mg by mouth daily.  . ondansetron (ZOFRAN) 4 MG tablet Take 4 mg by mouth every 6 (six) hours.  . pantoprazole (PROTONIX) 40 MG tablet Take 1 tablet (40 mg total) by mouth daily.   No facility-administered encounter medications on file as of 05/26/2018.     Functional Status:  In your present state of health, do you have any difficulty performing the following activities: 05/05/2018 04/20/2018  Hearing? N N  Vision? N N  Difficulty concentrating or making decisions? Malvin Johns  Walking or climbing stairs? Y N  Dressing or bathing? Y N  Doing errands, shopping? Malvin Johns  Some recent data might be hidden    Fall/Depression Screening:  PHQ 2/9 Scores 05/22/2015 03/31/2015  PHQ - 2 Score 2 0  PHQ- 9 Score 17 -    Assessment: [facility] CSW arrived at [facility] to complete routine PAC Consult but was informed by SNF social worker that they had just taken patient back to PT for therapy. [facility] CSW unable to meet with patient today but was able to coordinate care with SNF social worker. She reports that they have started taking patient to the PT room to do therapy  instead of doing it in the room. SNF social worker reports that she is going to schedule a meeting with son soon in order to gain final updates on Medicaid and LTC placement. SNF social worker agreeable to keep [facility] CSW updated on this. Plan is still for patient to reside at facility for long term placement.  Plan: [facility] CSW will follow up within one week and re-attempt PAC Consult.  Dickie La, BSW, MSW, LCSW [email or web address] Phone: [phone] Fax: [phone]

## 2018-05-27 ENCOUNTER — Other Ambulatory Visit: Payer: Self-pay | Admitting: Licensed Clinical Social Worker

## 2018-05-27 NOTE — Patient Outreach (Signed)
[facility]) Care Management  05/27/2018  RAVEN GROEBNER 08-14-1956 [id number]  Assessment-CSW completed outreach attempt today to patient's son. CSW unable to reach him successfully. CSW left a HIPPA compliant voice message encouraging a return call once available in order to discuss Medicaid enrollment and LTC placement.  Plan-CSW will await return call or complete an additional outreach if needed.  Dickie La, BSW, MSW, LCSW [employer] [email or web address] Phone: [phone] Fax: [phone]

## 2018-06-01 DIAGNOSIS — E119 Type 2 diabetes mellitus without complications: Secondary | ICD-10-CM | POA: Diagnosis not present

## 2018-06-01 DIAGNOSIS — I482 Chronic atrial fibrillation: Secondary | ICD-10-CM | POA: Diagnosis not present

## 2018-06-01 DIAGNOSIS — J449 Chronic obstructive pulmonary disease, unspecified: Secondary | ICD-10-CM | POA: Diagnosis not present

## 2018-06-01 DIAGNOSIS — I10 Essential (primary) hypertension: Secondary | ICD-10-CM | POA: Diagnosis not present

## 2018-06-03 ENCOUNTER — Other Ambulatory Visit: Payer: Self-pay | Admitting: Licensed Clinical Social Worker

## 2018-06-03 NOTE — Patient Outreach (Signed)
[facility]) Care Management  06/03/2018  [name] September 21, 1955 [id number]  [facility] CSW arrived at [facility] to complete [facility] Consult. [facility] CSW met with patient briefly before she had to leave for therapy. [facility] CSW reminded patient on her upcoming [facility] appointment with Dr. Casimiro Needle on 06/10/18. Patient reports to be doing well but desires to return home. Per SNF social worker, patient is unable to discharge from facility unless she were to become independent and she has not been able to much progress per therapy. [facility] CSW met with SNF social worker Roxanne. Roxanne reports that patient's plan is remain at facility for long term care. Per SNF social worker, patient has more periods of being lucid than not recently which is improvement as well. [facility] CSW will follow up within two weeks.  Eula Fried, BSW, MSW, [email or web address] Phone: [phone] Fax: [phone]

## 2018-06-10 ENCOUNTER — Ambulatory Visit (HOSPITAL_COMMUNITY): Payer: PPO | Admitting: Psychiatry

## 2018-06-10 ENCOUNTER — Other Ambulatory Visit: Payer: Self-pay | Admitting: Licensed Clinical Social Worker

## 2018-06-10 ENCOUNTER — Encounter

## 2018-06-10 DIAGNOSIS — J449 Chronic obstructive pulmonary disease, unspecified: Secondary | ICD-10-CM | POA: Diagnosis not present

## 2018-06-10 DIAGNOSIS — E119 Type 2 diabetes mellitus without complications: Secondary | ICD-10-CM | POA: Diagnosis not present

## 2018-06-10 DIAGNOSIS — I4819 Other persistent atrial fibrillation: Secondary | ICD-10-CM | POA: Diagnosis not present

## 2018-06-10 DIAGNOSIS — I10 Essential (primary) hypertension: Secondary | ICD-10-CM | POA: Diagnosis not present

## 2018-06-10 NOTE — Patient Outreach (Signed)
[facility]) Care Management  06/10/2018  [name] [birth year] [id number]  [facility] CSW arrived at [facility] and successfully completed PAC Consult. Patient reports to be feeling more alert and oriented today. Patient shares that she is experiencing ongoing chronic pain and states her pain is a 9 today on a scale from 1-10 with 10 being the highest. Patient reports that she has an appointment with Dr. Donell Beers this afternoon. Patient reports that her appetite is still low as she does not like the food at the facility. Patient reports that she also does not like drinking ensure. However, patient has communicated with nursing staff and they have agreed to bring patient a peanut butter and jelly sandwich daily with each lunch and dinner so that patient can at least have something she likes and is willing to eat. Patient reports that this has helped with her eating. Patient shares that she has been elevating her legs throughout the day and night to help alleviate her pain. Patient has not been able to bare any weight. Patient reports another upcoming appointment with Dr. Lyn Hollingshead, a sleep specialist. Patient is unable to recall this appointment date and time. Patient agreeable to [facility] CSW continuing to follow her during her SNF stay. Patient plans to transition to long term once her Medicare days are cut. Patient continues to receive therapy at this time.  Dickie La, BSW, MSW, LCSW [employer] [name] Phone: [phone] Fax: [phone]

## 2018-06-11 DIAGNOSIS — F0280 Dementia in other diseases classified elsewhere without behavioral disturbance: Secondary | ICD-10-CM | POA: Diagnosis not present

## 2018-06-11 DIAGNOSIS — E119 Type 2 diabetes mellitus without complications: Secondary | ICD-10-CM | POA: Diagnosis not present

## 2018-06-11 DIAGNOSIS — F329 Major depressive disorder, single episode, unspecified: Secondary | ICD-10-CM | POA: Diagnosis not present

## 2018-06-11 DIAGNOSIS — I10 Essential (primary) hypertension: Secondary | ICD-10-CM | POA: Diagnosis not present

## 2018-06-16 ENCOUNTER — Other Ambulatory Visit: Payer: Self-pay | Admitting: Licensed Clinical Social Worker

## 2018-06-16 NOTE — Patient Outreach (Addendum)
[facility]) Care Management  06/16/2018  [name] 03-Sep-1956 [id number]  [facility] CSW arrived at patient's room at [facility] and completed [facility] Consult. Patient shares that she is experiencing more SOB lately and is unsure why. She reports that she has trouble catching her breath even while she is laying down in the bed and not moving. Patient reports that although her SOB has increased, her appetite has improved somewhat. Patient reports that the cafeteria continues to make accommodations for her to help her be able to eat. Patient reports that she is trying to figure out why her cell phone will not work. [facility] CSW informed her that phone was not charged and assisted her with putting in on the charger. [facility] CSW provided emotional support during session which patient was receptive to. Patient shares that she wishes to return home but knows she is not strong enough to do so. Reflective listening provided during conversation as well as appropriate interventions. [facility] CSW will follow up within two weeks.  Dickie La, BSW, MSW, LCSW [employer] [email or web address] Phone: [phone] Fax: [phone]

## 2018-06-17 ENCOUNTER — Emergency Department (HOSPITAL_COMMUNITY): Payer: PPO

## 2018-06-17 ENCOUNTER — Encounter (HOSPITAL_COMMUNITY): Payer: Self-pay | Admitting: Emergency Medicine

## 2018-06-17 ENCOUNTER — Inpatient Hospital Stay (HOSPITAL_COMMUNITY)
Admission: EM | Admit: 2018-06-17 | Discharge: 2018-07-07 | DRG: 853 | Disposition: A | Discharge status: Hospice | Payer: PPO | Source: Skilled Nursing Facility | Attending: Internal Medicine | Admitting: Internal Medicine

## 2018-06-17 DIAGNOSIS — Z6824 Body mass index (BMI) 24.0-24.9, adult: Secondary | ICD-10-CM

## 2018-06-17 DIAGNOSIS — A419 Sepsis, unspecified organism: Principal | ICD-10-CM | POA: Diagnosis present

## 2018-06-17 DIAGNOSIS — Y846 Urinary catheterization as the cause of abnormal reaction of the patient, or of later complication, without mention of misadventure at the time of the procedure: Secondary | ICD-10-CM | POA: Diagnosis not present

## 2018-06-17 DIAGNOSIS — Z66 Do not resuscitate: Secondary | ICD-10-CM | POA: Diagnosis present

## 2018-06-17 DIAGNOSIS — Z01818 Encounter for other preprocedural examination: Secondary | ICD-10-CM

## 2018-06-17 DIAGNOSIS — R0603 Acute respiratory distress: Secondary | ICD-10-CM

## 2018-06-17 DIAGNOSIS — I69354 Hemiplegia and hemiparesis following cerebral infarction affecting left non-dominant side: Secondary | ICD-10-CM | POA: Diagnosis not present

## 2018-06-17 DIAGNOSIS — K219 Gastro-esophageal reflux disease without esophagitis: Secondary | ICD-10-CM | POA: Diagnosis present

## 2018-06-17 DIAGNOSIS — F0390 Unspecified dementia without behavioral disturbance: Secondary | ICD-10-CM | POA: Diagnosis present

## 2018-06-17 DIAGNOSIS — I82401 Acute embolism and thrombosis of unspecified deep veins of right lower extremity: Secondary | ICD-10-CM | POA: Diagnosis not present

## 2018-06-17 DIAGNOSIS — I619 Nontraumatic intracerebral hemorrhage, unspecified: Secondary | ICD-10-CM | POA: Diagnosis not present

## 2018-06-17 DIAGNOSIS — I82409 Acute embolism and thrombosis of unspecified deep veins of unspecified lower extremity: Secondary | ICD-10-CM | POA: Diagnosis not present

## 2018-06-17 DIAGNOSIS — G9340 Encephalopathy, unspecified: Secondary | ICD-10-CM | POA: Diagnosis not present

## 2018-06-17 DIAGNOSIS — R58 Hemorrhage, not elsewhere classified: Secondary | ICD-10-CM

## 2018-06-17 DIAGNOSIS — G4733 Obstructive sleep apnea (adult) (pediatric): Secondary | ICD-10-CM | POA: Diagnosis present

## 2018-06-17 DIAGNOSIS — I4819 Other persistent atrial fibrillation: Secondary | ICD-10-CM | POA: Diagnosis present

## 2018-06-17 DIAGNOSIS — J9600 Acute respiratory failure, unspecified whether with hypoxia or hypercapnia: Secondary | ICD-10-CM

## 2018-06-17 DIAGNOSIS — E876 Hypokalemia: Secondary | ICD-10-CM | POA: Diagnosis not present

## 2018-06-17 DIAGNOSIS — K661 Hemoperitoneum: Secondary | ICD-10-CM | POA: Diagnosis not present

## 2018-06-17 DIAGNOSIS — N179 Acute kidney failure, unspecified: Secondary | ICD-10-CM | POA: Diagnosis not present

## 2018-06-17 DIAGNOSIS — G9341 Metabolic encephalopathy: Secondary | ICD-10-CM | POA: Diagnosis not present

## 2018-06-17 DIAGNOSIS — R05 Cough: Secondary | ICD-10-CM | POA: Diagnosis not present

## 2018-06-17 DIAGNOSIS — Z466 Encounter for fitting and adjustment of urinary device: Secondary | ICD-10-CM | POA: Diagnosis not present

## 2018-06-17 DIAGNOSIS — S3993XA Unspecified injury of pelvis, initial encounter: Secondary | ICD-10-CM | POA: Diagnosis not present

## 2018-06-17 DIAGNOSIS — Z978 Presence of other specified devices: Secondary | ICD-10-CM

## 2018-06-17 DIAGNOSIS — I5042 Chronic combined systolic (congestive) and diastolic (congestive) heart failure: Secondary | ICD-10-CM | POA: Diagnosis present

## 2018-06-17 DIAGNOSIS — M79605 Pain in left leg: Secondary | ICD-10-CM | POA: Diagnosis not present

## 2018-06-17 DIAGNOSIS — J440 Chronic obstructive pulmonary disease with acute lower respiratory infection: Secondary | ICD-10-CM | POA: Diagnosis not present

## 2018-06-17 DIAGNOSIS — E785 Hyperlipidemia, unspecified: Secondary | ICD-10-CM | POA: Diagnosis present

## 2018-06-17 DIAGNOSIS — K117 Disturbances of salivary secretion: Secondary | ICD-10-CM | POA: Diagnosis not present

## 2018-06-17 DIAGNOSIS — K5900 Constipation, unspecified: Secondary | ICD-10-CM | POA: Diagnosis not present

## 2018-06-17 DIAGNOSIS — J9601 Acute respiratory failure with hypoxia: Secondary | ICD-10-CM

## 2018-06-17 DIAGNOSIS — Z4682 Encounter for fitting and adjustment of non-vascular catheter: Secondary | ICD-10-CM | POA: Diagnosis not present

## 2018-06-17 DIAGNOSIS — Z515 Encounter for palliative care: Secondary | ICD-10-CM | POA: Diagnosis not present

## 2018-06-17 DIAGNOSIS — E039 Hypothyroidism, unspecified: Secondary | ICD-10-CM | POA: Diagnosis present

## 2018-06-17 DIAGNOSIS — B961 Klebsiella pneumoniae [K. pneumoniae] as the cause of diseases classified elsewhere: Secondary | ICD-10-CM | POA: Diagnosis present

## 2018-06-17 DIAGNOSIS — R404 Transient alteration of awareness: Secondary | ICD-10-CM | POA: Diagnosis not present

## 2018-06-17 DIAGNOSIS — I82412 Acute embolism and thrombosis of left femoral vein: Secondary | ICD-10-CM | POA: Diagnosis present

## 2018-06-17 DIAGNOSIS — I2510 Atherosclerotic heart disease of native coronary artery without angina pectoris: Secondary | ICD-10-CM | POA: Diagnosis present

## 2018-06-17 DIAGNOSIS — I82432 Acute embolism and thrombosis of left popliteal vein: Secondary | ICD-10-CM | POA: Diagnosis not present

## 2018-06-17 DIAGNOSIS — Z951 Presence of aortocoronary bypass graft: Secondary | ICD-10-CM

## 2018-06-17 DIAGNOSIS — R6521 Severe sepsis with septic shock: Secondary | ICD-10-CM | POA: Diagnosis present

## 2018-06-17 DIAGNOSIS — I959 Hypotension, unspecified: Secondary | ICD-10-CM | POA: Diagnosis not present

## 2018-06-17 DIAGNOSIS — I4891 Unspecified atrial fibrillation: Secondary | ICD-10-CM

## 2018-06-17 DIAGNOSIS — Y95 Nosocomial condition: Secondary | ICD-10-CM | POA: Diagnosis present

## 2018-06-17 DIAGNOSIS — N390 Urinary tract infection, site not specified: Secondary | ICD-10-CM | POA: Diagnosis not present

## 2018-06-17 DIAGNOSIS — R0602 Shortness of breath: Secondary | ICD-10-CM

## 2018-06-17 DIAGNOSIS — I952 Hypotension due to drugs: Secondary | ICD-10-CM | POA: Diagnosis present

## 2018-06-17 DIAGNOSIS — J690 Pneumonitis due to inhalation of food and vomit: Secondary | ICD-10-CM

## 2018-06-17 DIAGNOSIS — I429 Cardiomyopathy, unspecified: Secondary | ICD-10-CM | POA: Diagnosis not present

## 2018-06-17 DIAGNOSIS — I82442 Acute embolism and thrombosis of left tibial vein: Secondary | ICD-10-CM | POA: Diagnosis not present

## 2018-06-17 DIAGNOSIS — E46 Unspecified protein-calorie malnutrition: Secondary | ICD-10-CM | POA: Diagnosis present

## 2018-06-17 DIAGNOSIS — Z95828 Presence of other vascular implants and grafts: Secondary | ICD-10-CM

## 2018-06-17 DIAGNOSIS — J9811 Atelectasis: Secondary | ICD-10-CM | POA: Diagnosis not present

## 2018-06-17 DIAGNOSIS — Z79899 Other long term (current) drug therapy: Secondary | ICD-10-CM

## 2018-06-17 DIAGNOSIS — N170 Acute kidney failure with tubular necrosis: Secondary | ICD-10-CM | POA: Diagnosis present

## 2018-06-17 DIAGNOSIS — D6489 Other specified anemias: Secondary | ICD-10-CM | POA: Diagnosis not present

## 2018-06-17 DIAGNOSIS — D62 Acute posthemorrhagic anemia: Secondary | ICD-10-CM | POA: Diagnosis not present

## 2018-06-17 DIAGNOSIS — I998 Other disorder of circulatory system: Secondary | ICD-10-CM | POA: Diagnosis present

## 2018-06-17 DIAGNOSIS — I2609 Other pulmonary embolism with acute cor pulmonale: Secondary | ICD-10-CM

## 2018-06-17 DIAGNOSIS — I509 Heart failure, unspecified: Secondary | ICD-10-CM | POA: Diagnosis not present

## 2018-06-17 DIAGNOSIS — Z818 Family history of other mental and behavioral disorders: Secondary | ICD-10-CM

## 2018-06-17 DIAGNOSIS — F17210 Nicotine dependence, cigarettes, uncomplicated: Secondary | ICD-10-CM | POA: Diagnosis not present

## 2018-06-17 DIAGNOSIS — R4182 Altered mental status, unspecified: Secondary | ICD-10-CM | POA: Diagnosis not present

## 2018-06-17 DIAGNOSIS — M79604 Pain in right leg: Secondary | ICD-10-CM | POA: Diagnosis not present

## 2018-06-17 DIAGNOSIS — R0600 Dyspnea, unspecified: Secondary | ICD-10-CM | POA: Diagnosis not present

## 2018-06-17 DIAGNOSIS — T83031A Leakage of indwelling urethral catheter, initial encounter: Secondary | ICD-10-CM | POA: Diagnosis not present

## 2018-06-17 DIAGNOSIS — R338 Other retention of urine: Secondary | ICD-10-CM | POA: Diagnosis not present

## 2018-06-17 DIAGNOSIS — I2693 Single subsegmental pulmonary embolism without acute cor pulmonale: Secondary | ICD-10-CM | POA: Diagnosis not present

## 2018-06-17 DIAGNOSIS — J151 Pneumonia due to Pseudomonas: Secondary | ICD-10-CM | POA: Diagnosis not present

## 2018-06-17 DIAGNOSIS — J90 Pleural effusion, not elsewhere classified: Secondary | ICD-10-CM | POA: Diagnosis not present

## 2018-06-17 DIAGNOSIS — I2699 Other pulmonary embolism without acute cor pulmonale: Secondary | ICD-10-CM | POA: Diagnosis not present

## 2018-06-17 DIAGNOSIS — I82452 Acute embolism and thrombosis of left peroneal vein: Secondary | ICD-10-CM | POA: Diagnosis present

## 2018-06-17 DIAGNOSIS — Z0189 Encounter for other specified special examinations: Secondary | ICD-10-CM

## 2018-06-17 DIAGNOSIS — E878 Other disorders of electrolyte and fluid balance, not elsewhere classified: Secondary | ICD-10-CM | POA: Diagnosis not present

## 2018-06-17 DIAGNOSIS — Z8249 Family history of ischemic heart disease and other diseases of the circulatory system: Secondary | ICD-10-CM

## 2018-06-17 DIAGNOSIS — Z825 Family history of asthma and other chronic lower respiratory diseases: Secondary | ICD-10-CM

## 2018-06-17 DIAGNOSIS — E872 Acidosis: Secondary | ICD-10-CM | POA: Diagnosis not present

## 2018-06-17 DIAGNOSIS — Z955 Presence of coronary angioplasty implant and graft: Secondary | ICD-10-CM

## 2018-06-17 DIAGNOSIS — I110 Hypertensive heart disease with heart failure: Secondary | ICD-10-CM | POA: Diagnosis present

## 2018-06-17 DIAGNOSIS — L89020 Pressure ulcer of left elbow, unstageable: Secondary | ICD-10-CM | POA: Diagnosis present

## 2018-06-17 DIAGNOSIS — R0902 Hypoxemia: Secondary | ICD-10-CM | POA: Diagnosis not present

## 2018-06-17 DIAGNOSIS — L8990 Pressure ulcer of unspecified site, unspecified stage: Secondary | ICD-10-CM

## 2018-06-17 DIAGNOSIS — R34 Anuria and oliguria: Secondary | ICD-10-CM | POA: Diagnosis not present

## 2018-06-17 DIAGNOSIS — Z7189 Other specified counseling: Secondary | ICD-10-CM | POA: Diagnosis not present

## 2018-06-17 DIAGNOSIS — D649 Anemia, unspecified: Secondary | ICD-10-CM | POA: Diagnosis not present

## 2018-06-17 DIAGNOSIS — R0989 Other specified symptoms and signs involving the circulatory and respiratory systems: Secondary | ICD-10-CM | POA: Diagnosis not present

## 2018-06-17 DIAGNOSIS — I5030 Unspecified diastolic (congestive) heart failure: Secondary | ICD-10-CM | POA: Diagnosis not present

## 2018-06-17 LAB — URINALYSIS, ROUTINE W REFLEX MICROSCOPIC
BILIRUBIN URINE: NEGATIVE
GLUCOSE, UA: NEGATIVE mg/dL
Ketones, ur: NEGATIVE mg/dL
NITRITE: NEGATIVE
PH: 7 (ref 5.0–8.0)
Protein, ur: 100 mg/dL — AB
SPECIFIC GRAVITY, URINE: 1.017 (ref 1.005–1.030)
WBC, UA: >50 WBC/hpf — ABNORMAL HIGH (ref 0–5)

## 2018-06-17 LAB — CBC WITH DIFFERENTIAL/PLATELET
ABS IMMATURE GRANULOCYTES: 0.05 10*3/uL (ref 0.00–0.07)
Basophils Absolute: 0 10*3/uL (ref 0.0–0.1)
Basophils Relative: 1 %
Eosinophils Absolute: 0.2 10*3/uL (ref 0.0–0.5)
Eosinophils Relative: 4 %
HCT: 32.3 % — ABNORMAL LOW (ref 36.0–46.0)
HEMOGLOBIN: 10.2 g/dL — AB (ref 12.0–15.0)
Immature Granulocytes: 1 %
LYMPHS PCT: 15 %
Lymphs Abs: 0.9 10*3/uL (ref 0.7–4.0)
MCH: 31.3 pg (ref 26.0–34.0)
MCHC: 31.6 g/dL (ref 30.0–36.0)
MCV: 99.1 fL (ref 80.0–100.0)
MONO ABS: 0.4 10*3/uL (ref 0.1–1.0)
Monocytes Relative: 7 %
NEUTROS ABS: 4.6 10*3/uL (ref 1.7–7.7)
Neutrophils Relative %: 72 %
Platelets: 253 10*3/uL (ref 150–400)
RBC: 3.26 MIL/uL — AB (ref 3.87–5.11)
RDW: 15 % (ref 11.5–15.5)
WBC: 6.3 10*3/uL (ref 4.0–10.5)
nRBC: 0 % (ref 0.0–0.2)

## 2018-06-17 LAB — COMPREHENSIVE METABOLIC PANEL
ALBUMIN: 2.5 g/dL — AB (ref 3.5–5.0)
ALT: 9 U/L (ref 0–44)
AST: 19 U/L (ref 15–41)
Alkaline Phosphatase: 85 U/L (ref 38–126)
Anion gap: 11 (ref 5–15)
BUN: 20 mg/dL (ref 6–20)
CHLORIDE: 103 mmol/L (ref 98–111)
CO2: 25 mmol/L (ref 22–32)
Calcium: 7.8 mg/dL — ABNORMAL LOW (ref 8.9–10.3)
Creatinine, Ser: 1.42 mg/dL — ABNORMAL HIGH (ref 0.44–1.00)
GFR calc Af Amer: 45 mL/min — ABNORMAL LOW (ref >60)
GFR, EST NON AFRICAN AMERICAN: 39 mL/min — AB (ref >60)
GLUCOSE: 91 mg/dL (ref 70–99)
Potassium: 3.5 mmol/L (ref 3.5–5.1)
SODIUM: 139 mmol/L (ref 135–145)
Total Bilirubin: 0.6 mg/dL (ref 0.3–1.2)
Total Protein: 5.8 g/dL — ABNORMAL LOW (ref 6.5–8.1)

## 2018-06-17 LAB — I-STAT VENOUS BLOOD GAS, ED
Acid-base deficit: 2 mmol/L (ref 0.0–2.0)
BICARBONATE: 24.2 mmol/L (ref 20.0–28.0)
O2 SAT: 53 %
PCO2 VEN: 45 mmHg (ref 44.0–60.0)
PO2 VEN: 30 mmHg — AB (ref 32.0–45.0)
TCO2: 26 mmol/L (ref 22–32)
pH, Ven: 7.34 (ref 7.250–7.430)

## 2018-06-17 LAB — I-STAT CG4 LACTIC ACID, ED: Lactic Acid, Venous: 2.34 mmol/L (ref 0.5–1.9)

## 2018-06-17 LAB — I-STAT TROPONIN, ED: Troponin i, poc: 0 ng/mL (ref 0.00–0.08)

## 2018-06-17 MED ORDER — IOPAMIDOL (ISOVUE-370) INJECTION 76%
80.0000 mL | Freq: Once | INTRAVENOUS | Status: AC | PRN
  Administered 2018-06-17: 80 mL via INTRAVENOUS

## 2018-06-17 MED ORDER — IOPAMIDOL (ISOVUE-370) INJECTION 76%
INTRAVENOUS | Status: AC
  Filled 2018-06-17: qty 100

## 2018-06-17 MED ORDER — FAMOTIDINE IN NACL 20-0.9 MG/50ML-% IV SOLN
20.0000 mg | Freq: Two times a day (BID) | INTRAVENOUS | Status: DC
  Administered 2018-06-17 – 2018-06-19 (×4): 20 mg via INTRAVENOUS
  Filled 2018-06-17 (×4): qty 50

## 2018-06-17 MED ORDER — SODIUM CHLORIDE 0.9 % IV SOLN
2.0000 g | INTRAVENOUS | Status: DC
  Administered 2018-06-18: 2 g via INTRAVENOUS
  Filled 2018-06-17: qty 20

## 2018-06-17 MED ORDER — LACTATED RINGERS IV BOLUS
500.0000 mL | Freq: Once | INTRAVENOUS | Status: AC
  Administered 2018-06-17: 500 mL via INTRAVENOUS

## 2018-06-17 MED ORDER — SODIUM CHLORIDE 0.9 % IV BOLUS: 1000.0000 mL | Freq: Once | INTRAVENOUS | Status: DC

## 2018-06-17 MED ORDER — METHYLPREDNISOLONE SODIUM SUCC 125 MG IJ SOLR
125.0000 mg | Freq: Once | INTRAMUSCULAR | Status: AC
  Administered 2018-06-17: 125 mg via INTRAVENOUS
  Filled 2018-06-17: qty 2

## 2018-06-17 MED ORDER — IPRATROPIUM-ALBUTEROL 0.5-2.5 (3) MG/3ML IN SOLN
3.0000 mL | Freq: Once | RESPIRATORY_TRACT | Status: AC
  Administered 2018-06-17: 3 mL via RESPIRATORY_TRACT
  Filled 2018-06-17: qty 3

## 2018-06-17 MED ORDER — BUDESONIDE 0.5 MG/2ML IN SUSP
0.5000 mg | Freq: Two times a day (BID) | RESPIRATORY_TRACT | Status: DC
  Administered 2018-06-17 – 2018-07-05 (×35): 0.5 mg via RESPIRATORY_TRACT
  Filled 2018-06-17 (×38): qty 2

## 2018-06-17 MED ORDER — IPRATROPIUM BROMIDE 0.02 % IN SOLN
0.5000 mg | Freq: Four times a day (QID) | RESPIRATORY_TRACT | Status: DC
  Administered 2018-06-17 – 2018-06-18 (×3): 0.5 mg via RESPIRATORY_TRACT
  Filled 2018-06-17 (×3): qty 2.5

## 2018-06-17 MED ORDER — HEPARIN BOLUS VIA INFUSION
4000.0000 [IU] | Freq: Once | INTRAVENOUS | Status: AC
  Administered 2018-06-17: 4000 [IU] via INTRAVENOUS
  Filled 2018-06-17: qty 4000

## 2018-06-17 MED ORDER — HEPARIN (PORCINE) IN NACL 100-0.45 UNIT/ML-% IJ SOLN
1150.0000 [IU]/h | INTRAMUSCULAR | Status: DC
  Administered 2018-06-17 (×2): 1150 [IU]/h via INTRAVENOUS
  Filled 2018-06-17: qty 250

## 2018-06-17 MED ORDER — LEVALBUTEROL HCL 0.63 MG/3ML IN NEBU
0.6300 mg | INHALATION_SOLUTION | Freq: Four times a day (QID) | RESPIRATORY_TRACT | Status: DC
  Administered 2018-06-17 – 2018-06-18 (×3): 0.63 mg via RESPIRATORY_TRACT
  Filled 2018-06-17 (×3): qty 3

## 2018-06-17 MED ORDER — DILTIAZEM HCL 25 MG/5ML IV SOLN: 15.0000 mg | Freq: Once | INTRAVENOUS | Status: DC

## 2018-06-17 NOTE — ED Provider Notes (Signed)
[facility] EMERGENCY DEPARTMENT Provider Note   CSN: [id number] Arrival date & time: 06/17/18  1437     History   Chief Complaint Chief Complaint  Patient presents with  . Cough  . Shortness of Breath    HPI [name] is a 62 y.o. female with PMH/o Afib (supposed to be on metropolol and diltiazem), COPD, brought in by EMS from [facility] who presents for cough and SOB. Nursing home staff noted low O2 sats and difficulty obtaining vitals, prompting EMS call. On initial EMS arrival, her O2 sat was 76% on RA with noted rales and rhonchi. She was given a breathing treatment which improved and placed on 2L O2 which bumped her up to 84% on RA. Patient reports she has been having a dry cough for awhile. She does not know if she has been using her inhalers more frequently. She states that she started having some SOB today. She also reports some generalized abdominal pain that has been ongoing for about a month. She denies any nausea/vomiting and has been eating/drinking appropriately. She felt like she has had some mild BLE edema. She has not had any overlying warmth, erythema. Patient denies any fever, CP.   The history is provided by the patient and the EMS personnel.    Past Medical History:  Diagnosis Date  . Acute right MCA stroke (HCC) 11/07/10  . Atrial fibrillation (HCC)    a. s/p TEE-DCCV 02/2104; b. Xarelto started  . CAD (coronary artery disease)    a.  cath 09/2010: LAD stent patent, S-Int/dCFX ok, S-PDA ok, L-LAD atretic;  b. Lexiscan Myoview (02/2014):  no ischemia, EF 55%; c. 11/2014 Cath/PCI: LM nl, LAD 20pISR, LCX 80-51m, OM1 nl, RI 70p, RCA 40-29m, RPDA 95ost/95-58m (PTCA only w/ reduction to 50p/33m), 60d, L->LAD atretic, VG->RI->OM nl, VG->PDA 100p.  . Cardiomyopathy with EF 40% at TEE 02/17/14 (likely tachycardia mediated - Myoview 02/19/14 neg for ischemia with normal EF) 02/18/2014  . COPD (chronic obstructive pulmonary disease) (HCC)   .  Depression   . Eczema   . GERD (gastroesophageal reflux disease)   . Headache   . HLD (hyperlipidemia)   . Homelessness 11/12/2011  . 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 Hx of CABG   . Hx of transesophageal echocardiography (TEE) for monitoring 11/2010   TEE 11/2010: EF 60-65%, BAE, trivial atrial septal shunt;  right heart cath in 10/2010 with elevated R and L heart pressures and diuretic started  . Hypertension   . Hypothyroidism   . Persistent atrial fibrillation 10/2014  . Pulmonary nodules    repeat CT due in 11/2011  . Sleep apnea    recent sleep study  in 04/2014 per chart review  shows no significant OSA    Patient Active Problem List   Diagnosis Date Noted  . Pulmonary embolism (HCC) 06/17/2018  . Malnutrition of moderate degree 05/07/2018  . Acute renal failure (ARF) (HCC) 05/05/2018  . Hypoglycemia without diagnosis of diabetes mellitus 05/05/2018  . Dementia (HCC) 05/05/2018  . Evaluation by psychiatric service required   . GERD (gastroesophageal reflux disease) 04/15/2018  . UTI (urinary tract infection) 04/15/2018  . Atrial fibrillation, chronic 04/15/2018  . Depression 04/15/2018  . Fall 04/15/2018  . Tobacco abuse 04/15/2018  . Sepsis (HCC) 04/15/2018  . Acute metabolic encephalopathy 04/15/2018  . Hypertensive emergency  03/25/2018  . NSTEMI (non-ST elevated myocardial infarction) (HCC)   . Troponin level elevated   . IVH (intraventricular hemorrhage) (HCC) 03/19/2018  . 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 Long term current use of amiodarone 03/27/2017  . Severe recurrent major depression without psychotic features (HCC) 11/19/2016    Class: Chronic  . Pre-procedure lab exam 09/13/2016  . Generalized headaches 07/04/2016  . Protein-calorie malnutrition, severe (HCC) 04/03/2015  . Chronic diastolic CHF (congestive heart failure), NYHA class 2 (HCC) 01/24/2015  . Demand  ischemia secondary to AF with RVR 12/13/2014  . CKD (chronic kidney disease), stage III (HCC) 11/11/2014  . Hypokalemia 05/22/2014  . Mood disorder (HCC) 03/20/2014  . HTN (hypertension) 03/20/2014  . Anemia, unspecified 03/20/2014  . Smoker 03/20/2014  . Atrial fibrillation with RVR (HCC) 03/03/2014  . AKI (acute kidney injury) (HCC) 03/03/2014  . Hypotension 03/03/2014  . Pulmonary hypertension (HCC) 03/03/2014  . COPD (chronic obstructive pulmonary disease) (HCC) 03/03/2014  . H/O: CVA (cerebrovascular accident)   . Long-term (current) use of anticoagulants   . Cardiomyopathy-h/o tachycardia mediated-EF 65% per echo March 2016 02/18/2014  . Hyperlipidemia 02/18/2014  . CAD '07, LAD PCI 2012, SVG-PDA PTCA 11/10/14 02/16/2014  . Atrial fibrillation with rapid ventricular response s/p TEE-DCCV (02/17/14) 02/16/2014  . Chronic diastolic heart failure (HCC)   . Pulmonary nodules   . Hypothyroidism   . OSA- C-pap intol     Past Surgical History:  Procedure Laterality Date  . BLADDER SURGERY    . CARDIAC CATHETERIZATION  2012  . CARDIOVERSION N/A 02/17/2014   Procedure: CARDIOVERSION;  Surgeon: Pricilla Riffle, MD;  Location: [facility] ENDOSCOPY;  Service: Cardiovascular;  Laterality: N/A;  . CARDIOVERSION N/A 09/20/2016   Procedure: CARDIOVERSION;  Surgeon: Lars Masson, MD;  Location: [facility] ENDOSCOPY;  Service: Cardiovascular;  Laterality: N/A;  . CHEST WALL RECONSTRUCTION    . CORONARY ARTERY BYPASS GRAFT    . debriment for infection in chest    . HERNIA REPAIR    . KNEE ARTHROSCOPY Bilateral   . LEFT AND RIGHT HEART CATHETERIZATION WITH CORONARY ANGIOGRAM N/A 11/10/2014   Procedure: LEFT AND RIGHT HEART CATHETERIZATION WITH CORONARY ANGIOGRAM;  Surgeon: Marykay Lex, MD;  Location: [facility] CATH LAB;  Service: Cardiovascular;  Laterality: N/A;  . Left mastoidectomy    . TEE WITHOUT CARDIOVERSION N/A 02/17/2014   Procedure: TRANSESOPHAGEAL ECHOCARDIOGRAM (TEE);  Surgeon: Pricilla Riffle, MD;   Location: [facility] ENDOSCOPY;  Service: Cardiovascular;  Laterality: N/A;     OB History   None      Home Medications    Prior to Admission medications   Medication Sig Start Date End Date Taking? Authorizing Provider  atorvastatin (LIPITOR) 40 MG tablet Take 1 tablet (40 mg total) by mouth daily at 6 PM. 03/24/18  Yes Biby, Jani Files, NP  cetirizine (ZYRTEC) 10 MG tablet Take 10 mg by mouth daily.   Yes [provider]  dicyclomine (BENTYL) 20 MG tablet Take 20 mg by mouth 2 (two) times daily.   Yes [provider]  diphenoxylate-atropine (LOMOTIL) 2.5-0.025 MG tablet Take 1 tablet by mouth 3 (three) times daily as needed for diarrhea or loose stools.    Yes [provider]  ENSURE (ENSURE) Take 237 mLs by mouth 3 (three) times daily between meals.   Yes [provider]  FLUoxetine (PROZAC) 20 MG capsule Take 20 mg by mouth daily.    Yes [provider]  gabapentin (NEURONTIN) 300 MG capsule Take 300 mg  by mouth 3 (three) times daily.    Yes [provider]  lisinopril (PRINIVIL,ZESTRIL) 10 MG tablet Take 10 mg by mouth every evening.    Yes [provider]  ondansetron (ZOFRAN) 4 MG tablet Take 4 mg by mouth every 6 (six) hours as needed for nausea or vomiting.    Yes [provider]  pantoprazole (PROTONIX) 40 MG tablet Take 1 tablet (40 mg total) by mouth daily. 05/09/18  Yes Burnadette Pop, MD    Family History Family History  Problem Relation Age of Onset  . COPD Mother   . Heart disease Mother   . Arthritis Mother        Rheumatoid and PMR  . Osteoporosis Mother        Mom fractured hip  . Diabetes type II Mother   . Depression Mother   . Heart attack Father   . Depression Father   . Hypertension Father   . Alcohol abuse Father   . Depression Sister   . Anxiety disorder Sister   . Drug abuse Sister   . Stroke Neg Hx     Social History Social History   Tobacco Use  . Smoking status: Current Every  Day Smoker    Packs/day: 0.50    Years: 46.00    Pack years: 23.00    Types: Cigarettes  . Smokeless tobacco: Never Used  . Tobacco comment: 05/05/2018 "quit a couple weeks ago"  Substance Use Topics  . Alcohol use: No    Alcohol/week: 0.0 standard drinks  . Drug use: No     Allergies   Avelox [moxifloxacin hcl in nacl]; Amoxicillin; Pamelor [nortriptyline hcl]; Penicillins; Other; Hydrocodone; Oxycodone; and Sulfa antibiotics   Review of Systems Review of Systems  Constitutional: Negative for fever.  Respiratory: Positive for cough and shortness of breath.   Cardiovascular: Negative for chest pain.  Gastrointestinal: Negative for abdominal pain, nausea and vomiting.  Genitourinary: Negative for dysuria and hematuria.  Neurological: Negative for headaches.  All other systems reviewed and are negative.    Physical Exam Updated Vital Signs BP 94/82   Pulse (!) 25   Temp 97.8 F (36.6 C) (Oral)   Resp (!) 22   Ht 5\' 4"  (1.626 m)   Wt 68 kg   SpO2 100%   BMI 25.75 kg/m   Physical Exam  Constitutional: She appears well-developed and well-nourished.  HENT:  Head: Normocephalic and atraumatic.  Mouth/Throat: Oropharynx is clear and moist and mucous membranes are normal.  Eyes: Pupils are equal, round, and reactive to light. Conjunctivae, EOM and lids are normal.  Neck: Full passive range of motion without pain.  Cardiovascular: Normal rate, regular rhythm, normal heart sounds and normal pulses. Exam reveals no gallop and no friction rub.  No murmur heard. Pulses:      Radial pulses are 2+ on the right side, and 2+ on the left side.       Dorsalis pedis pulses are 2+ on the right side, and 2+ on the left side.  Pulmonary/Chest: She has decreased breath sounds. She has rhonchi. She has rales.  Mild increased work of breathing. Speaking in short sentences. Diffuse rales and rhonchi noted throughout all lung fields.   Abdominal: Soft. Normal appearance. There is no  tenderness. There is no rigidity and no guarding.  Musculoskeletal: Normal range of motion.  BLE are symmetric in appearance. No overlying warmth, erythema, edema.   Neurological: She is alert.  Skin: Skin is warm and dry. Capillary  refill takes 2 to 3 seconds.  Slightly delayed cap refill noted to the bilateral toes and finger tips. Bilateral feet are slightly cool to touch.   Psychiatric: She has a normal mood and affect. Her speech is normal.  Nursing note and vitals reviewed.    ED Treatments / Results  Labs (all labs ordered are listed, but only abnormal results are displayed) Labs Reviewed  COMPREHENSIVE METABOLIC PANEL - Abnormal; Notable for the following components:      Result Value   Creatinine, Ser 1.42 (*)    Calcium 7.8 (*)    Total Protein 5.8 (*)    Albumin 2.5 (*)    GFR calc non Af Amer 39 (*)    GFR calc Af Amer 45 (*)    All other components within normal limits  CBC WITH DIFFERENTIAL/PLATELET - Abnormal; Notable for the following components:   RBC 3.26 (*)    Hemoglobin 10.2 (*)    HCT 32.3 (*)    All other components within normal limits  URINALYSIS, ROUTINE W REFLEX MICROSCOPIC - Abnormal; Notable for the following components:   Color, Urine AMBER (*)    APPearance CLOUDY (*)    Hgb urine dipstick SMALL (*)    Protein, ur 100 (*)    Leukocytes, UA MODERATE (*)    WBC, UA >50 (*)    Bacteria, UA MANY (*)    Non Squamous Epithelial 0-5 (*)    All other components within normal limits  I-STAT CG4 LACTIC ACID, ED - Abnormal; Notable for the following components:   Lactic Acid, Venous 2.34 (*)    All other components within normal limits  I-STAT VENOUS BLOOD GAS, ED - Abnormal; Notable for the following components:   pO2, Ven 30.0 (*)    All other components within normal limits  CULTURE, BLOOD (ROUTINE X 2)  CULTURE, BLOOD (ROUTINE X 2)  URINE CULTURE  MRSA PCR SCREENING  BLOOD GAS, VENOUS  BRAIN NATRIURETIC PEPTIDE  MAGNESIUM  PHOSPHORUS    PROCALCITONIN  BASIC METABOLIC PANEL  MAGNESIUM  PHOSPHORUS  HEPARIN LEVEL (UNFRACTIONATED)  I-STAT TROPONIN, ED  CBG MONITORING, ED  I-STAT CG4 LACTIC ACID, ED    EKG EKG Interpretation  Date/Time:  Wednesday June 17 2018 15:03:23 EDT Ventricular Rate:  122 PR Interval:    QRS Duration: 97 QT Interval:  363 QTC Calculation: 518 R Axis:   8 Text Interpretation:  Atrial fibrillation RSR' in V1 or V2, right VCD or RVH Abnormal T, consider ischemia, lateral leads Prolonged QT interval Confirmed by Virgina Norfolk [phone]) on 06/17/2018 3:40:02 PM   Radiology Ct Head Wo Contrast  Result Date: 06/17/2018 CLINICAL DATA:  62 year old female status post intraventricular hemorrhage in July. Now presenting with acute PE. Starting on heparin. EXAM: CT HEAD WITHOUT CONTRAST TECHNIQUE: Contiguous axial images were obtained from the base of the skull through the vertex without intravenous contrast. COMPARISON:  Head CTs 04/15/2018 and earlier. FINDINGS: Brain: Completely resolved intraventricular hemorrhage since July. Ex vacuo right lateral ventricle enlargement is related to the chronic right MCA territory encephalomalacia. No ventriculomegaly or transependymal edema. Stable gray-white matter differentiation throughout the brain. No midline shift, ventriculomegaly, mass effect, evidence of mass lesion, intracranial hemorrhage or evidence of cortically based acute infarction. Vascular: Mild Calcified atherosclerosis at the skull base. There is a degree of residual intravascular contrast from the chest CTA earlier today. Skull: Negative. Sinuses/Orbits: Improved paranasal sinus aeration throughout. Chronic left mastoidectomy. Stable mild right mastoid effusion. Other: No acute orbit or  scalp soft tissue findings. IMPRESSION: 1. No acute intracranial abnormality. 2. Completely resolved intraventricular hemorrhage since July. 3. Chronic right MCA infarct. Electronically Signed   By: Odessa Fleming M.D.   On:  06/17/2018 21:02   Ct Angio Chest Pe W And/or Wo Contrast  Result Date: 06/17/2018 CLINICAL DATA:  Cough and abnormal breath sounds with hypoxia EXAM: CT ANGIOGRAPHY CHEST WITH CONTRAST TECHNIQUE: Multidetector CT imaging of the chest was performed using the standard protocol during bolus administration of intravenous contrast. Multiplanar CT image reconstructions and MIPs were obtained to evaluate the vascular anatomy. CONTRAST:  70 mL ISOVUE-370 COMPARISON:  04/03/2011 FINDINGS: Cardiovascular: Thoracic aorta demonstrates atherosclerotic calcifications as well as changes of prior coronary bypass grafting. Mild cardiac enlargement is seen. Coronary calcifications are noted. The aorta is incompletely opacified precluding adequate evaluation. The pulmonary artery shows a normal branching pattern bilaterally. The left pulmonary artery and its branches are within normal limits. On the right however there is a large clot within the right main pulmonary artery and extending into the upper and lower lobe branches. The RV LV ratio is greater than 1 consistent with a degree of right heart strain. Mediastinum/Nodes: Thoracic inlet is within normal limits. No significant hilar or mediastinal adenopathy is noted. The esophagus as visualized is within normal limits. Lungs/Pleura: Lungs are well aerated bilaterally with emphysematous changes. Small bilateral pleural effusions and dependent atelectatic changes are seen. No findings to suggest pulmonary infarct are noted at this time. Mild alveolar edema is noted particularly in the right upper lobe. No focal confluent infiltrate is seen. Upper Abdomen: Cholelithiasis is noted without complicating factors. No other focal abnormality is noted. Musculoskeletal: Degenerative changes of the thoracic spine are seen. Chronic changes at the costosternal junction on the right is seen. Some mild herniation of abdominal fat is noted inferiorly below the xiphoid stable in appearance from  a prior exam from 2012. Review of the MIP images confirms the above findings. IMPRESSION: Positive for acute PE primarily within the right pulmonary arterial system with CT evidence of right heart strain (RV/LV Ratio is greater than 1) consistent with at least submassive (intermediate risk) PE. The presence of right heart strain has been associated with an increased risk of morbidity and mortality. Please activate Code PE by paging [phone]. Mild alveolar edema is noted particularly in the right upper lobe. Small effusions and bibasilar dependent atelectatic changes. Critical Value/emergent results were called by telephone at the time of interpretation on 06/17/2018 at 6:24 pm to Dr. Virgina Norfolk , who verbally acknowledged these results. Aortic Atherosclerosis (ICD10-I70.0) and Emphysema (ICD10-J43.9). Electronically Signed   By: Alcide Clever M.D.   On: 06/17/2018 18:28   Dg Chest Portable 1 View  Result Date: 06/17/2018 CLINICAL DATA:  Cough and rhonchi. EXAM: PORTABLE CHEST 1 VIEW COMPARISON:  05/05/2018 FINDINGS: Stable cardiomegaly with post CABG change. Nonaneurysmal slightly atherosclerotic aorta. No overt pulmonary edema, consolidation, effusion or pneumothorax. Mild chronic interstitial prominence redemonstrated of the lungs. No acute osseous abnormality. IMPRESSION: Stable cardiomegaly with minimal aortic atherosclerosis and post CABG change. No active pulmonary disease. Electronically Signed   By: Tollie Eth M.D.   On: 06/17/2018 16:01    Procedures .Critical Care Performed by: Maxwell Caul, PA-C Authorized by: Maxwell Caul, PA-C   Critical care provider statement:    Critical care time (minutes):  60   Critical care was necessary to treat or prevent imminent or life-threatening deterioration of the following conditions:  Cardiac failure, respiratory failure, circulatory failure,  sepsis and shock   Critical care was time spent personally by me on the following activities:   Discussions with consultants, evaluation of patient's response to treatment, examination of patient, ordering and performing treatments and interventions, ordering and review of laboratory studies, ordering and review of radiographic studies, pulse oximetry, re-evaluation of patient's condition, obtaining history from patient or surrogate and review of old charts   (including critical care time)  Medications Ordered in ED Medications  iopamidol (ISOVUE-370) 76 % injection (has no administration in time range)  cefTRIAXone (ROCEPHIN) 2 g in sodium chloride 0.9 % 100 mL IVPB (2 g Intravenous New Bag/Given 06/18/18 0010)  sodium chloride 0.9 % bolus 1,000 mL (1,000 mLs Intravenous Transfusing/Transfer 06/17/18 2034)  famotidine (PEPCID) IVPB 20 mg premix ( Intravenous Stopped 06/17/18 2335)  ipratropium (ATROVENT) nebulizer solution 0.5 mg (0.5 mg Nebulization Given 06/17/18 2107)  levalbuterol (XOPENEX) nebulizer solution 0.63 mg (0.63 mg Nebulization Given 06/17/18 2107)  budesonide (PULMICORT) nebulizer solution 0.5 mg (0.5 mg Nebulization Given 06/17/18 2305)  heparin ADULT infusion 100 units/mL (25000 units/237mL sodium chloride 0.45%) (1,150 Units/hr Intravenous Rate/Dose Verify 06/18/18 0000)  methylPREDNISolone sodium succinate (SOLU-MEDROL) 125 mg/2 mL injection 125 mg (125 mg Intravenous Given 06/17/18 1551)  ipratropium-albuterol (DUONEB) 0.5-2.5 (3) MG/3ML nebulizer solution 3 mL (3 mLs Nebulization Given 06/17/18 1540)  lactated ringers bolus 500 mL (0 mLs Intravenous Stopped 06/17/18 1734)  iopamidol (ISOVUE-370) 76 % injection 80 mL (80 mLs Intravenous Contrast Given 06/17/18 1750)  heparin bolus via infusion 4,000 Units (4,000 Units Intravenous Bolus from Bag 06/17/18 2033)     Initial Impression / Assessment and Plan / ED Course  I have reviewed the triage vital signs and the nursing notes.  Pertinent labs & imaging results that were available during my care of the patient were reviewed  by me and considered in my medical decision making (see chart for details).     62 y.o. F with PMH/o COPD, CHF, Afib (supposed to be on diltiazem and metropolol), Dementia who presents for evaluation of cough and SOB. Brought in by EMS for low O2 sats. No fevers, CP. On exam, patient is afebrile. She has some mild increased work of breathing but able to speak in short sentences. Vitals reviewed. Difficulty obtaining O2 sats. Patient with rales and rhonchi diffusely throughout all lung fields. BLE are symmetric in appearance. No overlying warmth, erythema. Consider infectious process vs COPD exacerbation vs CHF exacerbation, though patient does not look overwhelmingly fluid overloaded. Plan for CXR, breathing treatments, basic labs. Given hypoxia, will plan to start on BiPap.   VBG shows pH 7.3, pCO2 45.0, pO2 30. Trop negative. CMP shows Cr 1.42 which is slightly above baseline. CBC shows no leukocytosis. Hgb is 10.2 and Hct is 32.3. Chest XR shows stable cardiomegaly. No evidence of pulmonary edema or infectious process. Given hypotension and lack of pulmonary edema on exam, will give fluids. Additionally given persistent hypoxia, will plan for CTA chest for evaluation of PE.   CT is positive for acute PE primarily within the right pulmonary arterial system with evidence of right heart strain.  Could be initiated.  Will discuss with critical care.  Patient heart rate has increased at this time.  She still in A. fib and it looks like she is maintaining rate between 1 4160.  Question of this is secondary to the PE process going on.  Additionally, patient's blood pressure is maintaining between 80-90 systolically.  Review of patient's chart shows that she had previously been  on Xarelto for management of her A. fib.  In July 2019, she was admitted with intracranial hemorrhage and was therefore discontinued on her Xarelto.  It does not look like she was ever restarted on the Xarelto.  Given concerns with blood  thinners, will hold off on heparinizing for PE until critical care sees patient.  Discussed patient with Dr. Wallace Cullens [facility]). He will come see the pa tient.   Discussed with critical care.  They will accept patient for admission.  They will do CT head and proceed with anticoagulation for treatment of PE.  Final Clinical Impressions(s) / ED Diagnoses   Final diagnoses:  Other acute pulmonary embolism with acute cor pulmonale (HCC)  Acute respiratory failure with hypoxia (HCC)  Urinary tract infection without hematuria, site unspecified    ED Discharge Orders    None       Maxwell Caul, PA-C 06/18/18 0047    Virgina Norfolk, DO 06/18/18 0308
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
## 2018-06-17 NOTE — Progress Notes (Signed)
Pt transported on Bipap to CT and returned to ED D31 without complications.

## 2018-06-17 NOTE — Progress Notes (Signed)
Patient reports 9/10 cramping chest pain. Patient reports this has been going on for months. Elink notified. Orders received.

## 2018-06-17 NOTE — ED Provider Notes (Signed)
Medical screening examination/treatment/procedure(s) were conducted as a shared visit with non-physician practitioner(s) and myself.  I personally evaluated the patient during the encounter. Briefly, the patient is a 62 y.o. female with history of hypertension, CAD, atrial fibrillation, COPD, right MCA stroke who presents to the ED with cough, shortness of breath.  Patient with tachypnea, hypothermia, unable to obtain pulse ox upon arrival.  Patient supposedly hypoxic with shortness of breath at facility today.  Overall she is difficult to obtain a pulse ox on.  When there is a waveform she appears to be in the 80s.  Patient was started on BiPAP for increased work of breathing upon arrival.  However lung sounds appear diminished throughout, no peripheral edema.  Concern for COPD exacerbation versus volume overload versus CAD.  Possible PE. Possible sepsis.  Patient improved while on BiPAP.  Overall appears comfortable on it.  EKG showed atrial fibrillation with rapid ventricular rate.  Patient given 500 cc bolus as after her chest x-ray showed no signs of large volume overload or pneumonia concern for likely COPD exacerbation versus PE.  Troponin within normal limits.  Blood gas unremarkable.  No significant leukocytosis.  No significant anemia.  Mild elevation in creatinine and mild elevation lactic acid.  Blood cultures collected.  Urinalysis shows signs of urinary tract infection.  Started on antibiotics.  However suspect pulmonary process given continued hypoxia after bear hugger was placed and reliable pulse ox was found and CT PE study was ordered.  Patient radiology called me on the phone to discuss that CT PE study showed large pulmonary embolus with right ventricular strain.  Patient was previously on blood thinners but had acute hemorrhage recently from that and is no longer on anticoagulation.  The ICU was consulted and would like head CT and will likely start IV heparin given the risk/benefit with the  patient.  Suspect patient with symptoms mostly from PE.  Blood pressure was labile and she did have some intermittently low blood pressures.  Patient was transitioned off BiPAP and was stable on 5 L of oxygen.  Patient with PE and UTI.  Admitted to the ICU in critical condition.  Remained hemodynamically fair throughout my care.  This chart was dictated using voice recognition software.  Despite best efforts to proofread,  errors can occur which can change the documentation meaning.    EKG Interpretation  Date/Time:  Wednesday June 17 2018 15:03:23 EDT Ventricular Rate:  122 PR Interval:    QRS Duration: 97 QT Interval:  363 QTC Calculation: 518 R Axis:   8 Text Interpretation:  Atrial fibrillation RSR' in V1 or V2, right VCD or RVH Abnormal T, consider ischemia, lateral leads Prolonged QT interval Confirmed by Virgina Norfolk [phone]) on 06/17/2018 3:40:02 PM        .Critical Care Performed by: Virgina Norfolk, DO Authorized by: Virgina Norfolk, DO   Critical care provider statement:    Critical care time (minutes):  45   Critical care time was exclusive of:  Separately billable procedures and treating other patients and teaching time   Critical care was necessary to treat or prevent imminent or life-threatening deterioration of the following conditions:  Circulatory failure and respiratory failure   Critical care was time spent personally by me on the following activities:  Development of treatment plan with patient or surrogate, evaluation of patient's response to treatment, examination of patient, ordering and performing treatments and interventions, ordering and review of laboratory studies, ordering and review of radiographic studies, pulse oximetry, re-evaluation  of patient's condition and review of old charts   I assumed direction of critical care for this patient from another provider in my specialty: no       Virgina Norfolk, DO 06/18/18 [phone]

## 2018-06-17 NOTE — ED Triage Notes (Signed)
Pt arrives via EMS from [facility] where staff reports they were unable to obtain O2 sats on pt. Staff gave pt a breathing tx and put on [place] 5L. Pt denies pain or any distress. Cough noted, rhonchi in upper lobes. Hx of COPD, CHF, dementia. EMS reports trouble obtaining O2 sats also

## 2018-06-17 NOTE — ED Notes (Signed)
Checked lactic acid status- lab never ran. I-stat collected
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
## 2018-06-17 NOTE — Plan of Care (Signed)
°  Problem: Coping: °Goal: Level of anxiety will decrease °Outcome: Progressing °  °

## 2018-06-17 NOTE — Progress Notes (Signed)
eLink Physician-Brief Progress Note Patient Name: [name] DOB: [birth year] MRN: [id number]   Date of Service  06/17/2018  HPI/Events of Note  Pt admitted with PE currently on Heparin infusion. PT is complaining of some chest discomfort which pre-dates current diagnosis.  eICU Interventions  Tylenol 1000 mg po Q 6 hrs prn pain        Okoronkwo U Ogan 06/17/2018, 10:42 PM

## 2018-06-17 NOTE — Progress Notes (Signed)
ANTICOAGULATION CONSULT NOTE - Initial Consult  Pharmacy Consult for heparin Indication: pulmonary embolus  Allergies  Allergen Reactions  . Avelox [Moxifloxacin Hcl In Nacl] Other (See Comments)    Mental breakdown.  [name] Amoxicillin Hives  . Pamelor [Nortriptyline Hcl] Other (See Comments)    Made her want to hurt herself  . Penicillins Hives     Has patient had a PCN reaction causing immediate rash, facial/tongue/throat swelling, SOB or lightheadedness with hypotension: Yes Has patient had a PCN reaction causing severe rash involving mucus membranes or skin necrosis: Unk Has patient had a PCN reaction that required hospitalization: Unk Has patient had a PCN reaction occurring within the last 10 years: Unk If all of the above answers are "NO", then may proceed with Cephalosporin use.   . Other Itching    Any med that ends in -caine  . Hydrocodone Itching  . Oxycodone Itching  . Sulfa Antibiotics Other (See Comments)    Reaction unknown    Patient Measurements: Height: 5\' 4"  (162.6 cm) Weight: 150 lb (68 kg) IBW/kg (Calculated) : 54.7 Heparin Dosing Weight: 68 kg  Vital Signs: Temp: 97.4 F (36.3 C) (10/09 1713) Temp Source: Rectal (10/09 1713) BP: 117/86 (10/09 1930) Pulse Rate: 86 (10/09 1930)  Labs: Recent Labs    06/17/18 1515  HGB 10.2*  HCT 32.3*  PLT 253  CREATININE 1.42*    Estimated Creatinine Clearance: 39.9 mL/min (A) (by C-G formula based on SCr of 1.42 mg/dL (H)).   Medical History: Past Medical History:  Diagnosis Date  . Acute right MCA stroke (HCC) 11/07/10  . Atrial fibrillation (HCC)    a. s/p TEE-DCCV 02/2104; b. Xarelto started  . CAD (coronary artery disease)    a.  cath 09/2010: LAD stent patent, S-Int/dCFX ok, S-PDA ok, L-LAD atretic;  b. Lexiscan Myoview (02/2014):  no ischemia, EF 55%; c. 11/2014 Cath/PCI: LM nl, LAD 20pISR, LCX 80-4m, OM1 nl, RI 70p, RCA 40-13m, RPDA 95ost/95-36m (PTCA only w/ reduction to 50p/53m), 60d, L->LAD atretic,  VG->RI->OM nl, VG->PDA 100p.  . Cardiomyopathy with EF 40% at TEE 02/17/14 (likely tachycardia mediated - Myoview 02/19/14 neg for ischemia with normal EF) 02/18/2014  . COPD (chronic obstructive pulmonary disease) (HCC)   . Depression   . Eczema   . GERD (gastroesophageal reflux disease)   . Headache   . HLD (hyperlipidemia)   . Homelessness 11/12/2011  . 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 Hx of CABG   . Hx of transesophageal echocardiography (TEE) for monitoring 11/2010   TEE 11/2010: EF 60-65%, BAE, trivial atrial septal shunt;  right heart cath in 10/2010 with elevated R and L heart pressures and diuretic started  . Hypertension   . Hypothyroidism   . Persistent atrial fibrillation 10/2014  . Pulmonary nodules    repeat CT due in 11/2011  . Sleep apnea    recent sleep study  in 04/2014 per chart review  shows no significant OSA    Assessment: 62 yo F presents with SOB. CT angio shows acute PE with heart strain. No anticoag PTA. Hgb 10.2, plts wnl. Pharmacy consulted to start heparin.  Goal of Therapy:  Heparin level 0.3-0.7 units/ml Monitor platelets by anticoagulation protocol: Yes   Plan:  Give heparin 4,000 unit bolus Start heparin gtt at 1,150 units/hr Check 6 hr heparin level Monitor daily heparin level, CBC,  s/s of bleed   Geoff Dacanay J 06/17/2018,8:27 PM

## 2018-06-17 NOTE — Progress Notes (Signed)
Patient expressed concerns with the care she had previously been receiving at skilled nursing facility. Social work ordered per protocol. Will continue to assess and monitor.

## 2018-06-18 ENCOUNTER — Other Ambulatory Visit: Payer: Self-pay

## 2018-06-18 ENCOUNTER — Inpatient Hospital Stay (HOSPITAL_COMMUNITY): Payer: PPO

## 2018-06-18 ENCOUNTER — Encounter (HOSPITAL_COMMUNITY): Payer: Self-pay

## 2018-06-18 DIAGNOSIS — L8990 Pressure ulcer of unspecified site, unspecified stage: Secondary | ICD-10-CM

## 2018-06-18 DIAGNOSIS — I5030 Unspecified diastolic (congestive) heart failure: Secondary | ICD-10-CM

## 2018-06-18 LAB — BASIC METABOLIC PANEL
Anion gap: 9 (ref 5–15)
BUN: 18 mg/dL (ref 6–20)
CHLORIDE: 106 mmol/L (ref 98–111)
CO2: 22 mmol/L (ref 22–32)
Calcium: 7.2 mg/dL — ABNORMAL LOW (ref 8.9–10.3)
Creatinine, Ser: 1.34 mg/dL — ABNORMAL HIGH (ref 0.44–1.00)
GFR calc Af Amer: 49 mL/min — ABNORMAL LOW (ref >60)
GFR calc non Af Amer: 42 mL/min — ABNORMAL LOW (ref >60)
GLUCOSE: 145 mg/dL — AB (ref 70–99)
POTASSIUM: 3.9 mmol/L (ref 3.5–5.1)
Sodium: 137 mmol/L (ref 135–145)

## 2018-06-18 LAB — MRSA PCR SCREENING: MRSA BY PCR: NEGATIVE

## 2018-06-18 LAB — CBC
HCT: 31.9 % — ABNORMAL LOW (ref 36.0–46.0)
Hemoglobin: 9.4 g/dL — ABNORMAL LOW (ref 12.0–15.0)
MCH: 29.7 pg (ref 26.0–34.0)
MCHC: 29.5 g/dL — ABNORMAL LOW (ref 30.0–36.0)
MCV: 100.6 fL — AB (ref 80.0–100.0)
NRBC: 0 % (ref 0.0–0.2)
PLATELETS: 242 10*3/uL (ref 150–400)
RBC: 3.17 MIL/uL — ABNORMAL LOW (ref 3.87–5.11)
RDW: 14.8 % (ref 11.5–15.5)
WBC: 6.1 10*3/uL (ref 4.0–10.5)

## 2018-06-18 LAB — ECHOCARDIOGRAM COMPLETE
Height: 64 in
Weight: 2398.6 oz

## 2018-06-18 LAB — BRAIN NATRIURETIC PEPTIDE: B Natriuretic Peptide: 206.5 pg/mL — ABNORMAL HIGH (ref 0.0–100.0)

## 2018-06-18 LAB — HEPARIN LEVEL (UNFRACTIONATED)
HEPARIN UNFRACTIONATED: 1.3 [IU]/mL — AB (ref 0.30–0.70)
HEPARIN UNFRACTIONATED: 0.83 [IU]/mL — AB (ref 0.30–0.70)
Heparin Unfractionated: 1.6 IU/mL — ABNORMAL HIGH (ref 0.30–0.70)

## 2018-06-18 LAB — MAGNESIUM
Magnesium: 0.9 mg/dL — CL (ref 1.7–2.4)
Magnesium: 3.1 mg/dL — ABNORMAL HIGH (ref 1.7–2.4)

## 2018-06-18 LAB — PROCALCITONIN

## 2018-06-18 LAB — PHOSPHORUS: Phosphorus: 3.7 mg/dL (ref 2.5–4.6)

## 2018-06-18 LAB — LACTIC ACID, PLASMA: Lactic Acid, Venous: 2.1 mmol/L (ref 0.5–1.9)

## 2018-06-18 MED ORDER — SODIUM CHLORIDE 0.9 % IV SOLN
INTRAVENOUS | Status: DC
  Administered 2018-06-18: 1000 mL via INTRAVENOUS

## 2018-06-18 MED ORDER — JUVEN PO PACK
1.0000 | PACK | Freq: Two times a day (BID) | ORAL | Status: DC
  Administered 2018-06-18 – 2018-06-19 (×3): 1 via ORAL
  Filled 2018-06-18 (×8): qty 1

## 2018-06-18 MED ORDER — POVIDONE-IODINE 10 % EX SOLN
Freq: Two times a day (BID) | CUTANEOUS | Status: DC
  Administered 2018-06-18 (×2): 1 via TOPICAL
  Administered 2018-06-19: 10:00:00 via TOPICAL
  Administered 2018-06-20: 1 via TOPICAL
  Administered 2018-06-20: 23:00:00 via TOPICAL
  Administered 2018-06-21: 1 via TOPICAL
  Administered 2018-06-22 (×3): via TOPICAL
  Administered 2018-06-23: 1 via TOPICAL
  Administered 2018-06-23 – 2018-06-26 (×5): via TOPICAL
  Administered 2018-06-26 – 2018-06-27 (×2): 1 via TOPICAL
  Administered 2018-06-27: 22:00:00 via TOPICAL
  Administered 2018-06-28: 1 via TOPICAL
  Administered 2018-06-29 – 2018-07-07 (×18): via TOPICAL
  Filled 2018-06-18 (×3): qty 118

## 2018-06-18 MED ORDER — SODIUM CHLORIDE 0.9 % IV SOLN
1.0000 g | INTRAVENOUS | Status: AC
  Administered 2018-06-19 – 2018-06-22 (×4): 1 g via INTRAVENOUS
  Filled 2018-06-18 (×4): qty 10

## 2018-06-18 MED ORDER — SODIUM CHLORIDE 0.9 % IV SOLN
6.0000 g | Freq: Once | INTRAVENOUS | Status: AC
  Administered 2018-06-18: 6 g via INTRAVENOUS
  Filled 2018-06-18: qty 12

## 2018-06-18 MED ORDER — HEPARIN (PORCINE) IN NACL 100-0.45 UNIT/ML-% IJ SOLN
750.0000 [IU]/h | INTRAMUSCULAR | Status: DC
  Administered 2018-06-18 (×2): 650 [IU]/h via INTRAVENOUS
  Administered 2018-06-20 – 2018-06-22 (×2): 700 [IU]/h via INTRAVENOUS
  Administered 2018-06-23 – 2018-06-24 (×2): 750 [IU]/h via INTRAVENOUS
  Administered 2018-06-26: 700 [IU]/h via INTRAVENOUS
  Filled 2018-06-18 (×6): qty 250

## 2018-06-18 MED ORDER — HEPARIN (PORCINE) IN NACL 100-0.45 UNIT/ML-% IJ SOLN
950.0000 [IU]/h | INTRAMUSCULAR | Status: DC
  Administered 2018-06-18: 950 [IU]/h via INTRAVENOUS

## 2018-06-18 MED ORDER — LACTATED RINGERS IV BOLUS
1000.0000 mL | Freq: Once | INTRAVENOUS | Status: AC
  Administered 2018-06-18: 1000 mL via INTRAVENOUS

## 2018-06-18 MED ORDER — ENSURE ENLIVE PO LIQD
237.0000 mL | Freq: Two times a day (BID) | ORAL | Status: DC
  Administered 2018-06-18 – 2018-06-19 (×2): 237 mL via ORAL

## 2018-06-18 MED ORDER — SODIUM CHLORIDE 0.9 % IV BOLUS
250.0000 mL | Freq: Once | INTRAVENOUS | Status: AC
  Administered 2018-06-18: 250 mL via INTRAVENOUS

## 2018-06-18 MED ORDER — IPRATROPIUM BROMIDE 0.02 % IN SOLN
0.5000 mg | Freq: Four times a day (QID) | RESPIRATORY_TRACT | Status: DC
  Administered 2018-06-18 – 2018-06-19 (×4): 0.5 mg via RESPIRATORY_TRACT
  Filled 2018-06-18 (×4): qty 2.5

## 2018-06-18 MED ORDER — LEVALBUTEROL HCL 0.63 MG/3ML IN NEBU
0.6300 mg | INHALATION_SOLUTION | Freq: Four times a day (QID) | RESPIRATORY_TRACT | Status: DC
  Administered 2018-06-18 – 2018-06-19 (×4): 0.63 mg via RESPIRATORY_TRACT
  Filled 2018-06-18 (×4): qty 3

## 2018-06-18 NOTE — Progress Notes (Signed)
CRITICAL VALUE ALERT  Critical Value:  Lactic Acid - 2.1  Date & Time Notied:  06/18/2018 0947  Provider Notified: Posey Boyer PCCM NP  Orders Received/Actions taken: None at this time

## 2018-06-18 NOTE — Evaluation (Signed)
Clinical/Bedside Swallow Evaluation Patient Details  Name: [name] MRN: [id number] Date of Birth: December 31, 1955  Today's Date: 06/18/2018 Time: SLP Start Time (ACUTE ONLY): 1145 SLP Stop Time (ACUTE ONLY): 1201 SLP Time Calculation (min) (ACUTE ONLY): 16 min  Past Medical History:  Past Medical History:  Diagnosis Date  . Acute right MCA stroke (HCC) 11/07/10  . Atrial fibrillation (HCC)    a. s/p TEE-DCCV 02/2104; b. Xarelto started  . CAD (coronary artery disease)    a.  cath 09/2010: LAD stent patent, S-Int/dCFX ok, S-PDA ok, L-LAD atretic;  b. Lexiscan Myoview (02/2014):  no ischemia, EF 55%; c. 11/2014 Cath/PCI: LM nl, LAD 20pISR, LCX 80-66m, OM1 nl, RI 70p, RCA 40-15m, RPDA 95ost/95-71m (PTCA only w/ reduction to 50p/28m), 60d, L->LAD atretic, VG->RI->OM nl, VG->PDA 100p.  . Cardiomyopathy with EF 40% at TEE 02/17/14 (likely tachycardia mediated - Myoview 02/19/14 neg for ischemia with normal EF) 02/18/2014  . COPD (chronic obstructive pulmonary disease) (HCC)   . Depression   . Eczema   . GERD (gastroesophageal reflux disease)   . Headache   . HLD (hyperlipidemia)   . Homelessness 11/12/2011  . 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 Hx of CABG   . Hx of transesophageal echocardiography (TEE) for monitoring 11/2010   TEE 11/2010: EF 60-65%, BAE, trivial atrial septal shunt;  right heart cath in 10/2010 with elevated R and L heart pressures and diuretic started  . Hypertension   . Hypothyroidism   . Persistent atrial fibrillation 10/2014  . Pulmonary nodules    repeat CT due in 11/2011  . Sleep apnea    recent sleep study  in 04/2014 per chart review  shows no significant OSA   Past Surgical History:  Past Surgical History:  Procedure Laterality Date  . BLADDER SURGERY    . CARDIAC CATHETERIZATION  2012  . CARDIOVERSION N/A 02/17/2014   Procedure: CARDIOVERSION;  Surgeon: Pricilla Riffle, MD;   Location: [facility] ENDOSCOPY;  Service: Cardiovascular;  Laterality: N/A;  . CARDIOVERSION N/A 09/20/2016   Procedure: CARDIOVERSION;  Surgeon: Lars Masson, MD;  Location: [facility] ENDOSCOPY;  Service: Cardiovascular;  Laterality: N/A;  . CHEST WALL RECONSTRUCTION    . CORONARY ARTERY BYPASS GRAFT    . debriment for infection in chest    . HERNIA REPAIR    . KNEE ARTHROSCOPY Bilateral   . LEFT AND RIGHT HEART CATHETERIZATION WITH CORONARY ANGIOGRAM N/A 11/10/2014   Procedure: LEFT AND RIGHT HEART CATHETERIZATION WITH CORONARY ANGIOGRAM;  Surgeon: Marykay Lex, MD;  Location: [facility] CATH LAB;  Service: Cardiovascular;  Laterality: N/A;  . Left mastoidectomy    . TEE WITHOUT CARDIOVERSION N/A 02/17/2014   Procedure: TRANSESOPHAGEAL ECHOCARDIOGRAM (TEE);  Surgeon: Pricilla Riffle, MD;  Location: [facility] ENDOSCOPY;  Service: Cardiovascular;  Laterality: N/A;   HPI:  62 year old female hx CVA, COPD, who resides in SNF after recent ICH / IVH with L sided weakness. Now presenting with hypoxia and PE. Started on heparin infusion. No histoyr of dysphagia from prior CVA   Assessment / Plan / Recommendation Clinical Impression  Pt demonstrates stable function, no significant shortness of breath impacting swallow function despite PE. Pt known to this SLP from prior admit for CVA which did not result in dysphagia. Pt did have an intermittent slight cough/throat clear throught SLP visit, not directly indicative of decreased  airway protection with PO. Will initiate a regular diet and thin liquids and f/u x1 for tolerance. Discussed with RN.  SLP Visit Diagnosis: Dysphagia, oropharyngeal phase (R13.12)    Aspiration Risk  Mild aspiration risk    Diet Recommendation Regular;Thin liquid   Liquid Administration via: Cup;Straw Medication Administration: Whole meds with liquid Supervision: Staff to assist with self feeding Compensations: Slow rate;Small sips/bites Postural Changes: Seated upright at 90 degrees    Other   Recommendations Oral Care Recommendations: Oral care BID   Follow up Recommendations Skilled Nursing facility      Frequency and Duration min 2x/week  1 week       Prognosis Prognosis for Safe Diet Advancement: Good      Swallow Study   General HPI: 62 year old female hx CVA, COPD, who resides in SNF after recent ICH / IVH with L sided weakness. Now presenting with hypoxia and PE. Started on heparin infusion. No histoyr of dysphagia from prior CVA Type of Study: Bedside Swallow Evaluation Previous Swallow Assessment: noe Diet Prior to this Study: NPO Temperature Spikes Noted: No Respiratory Status: Nasal cannula History of Recent Intubation: No Behavior/Cognition: Alert;Cooperative;Pleasant mood Oral Cavity Assessment: Within Functional Limits Oral Care Completed by SLP: No Oral Cavity - Dentition: Adequate natural dentition Vision: Functional for self-feeding Self-Feeding Abilities: Able to feed self Patient Positioning: Upright in bed Baseline Vocal Quality: Normal Volitional Cough: Strong Volitional Swallow: Able to elicit    Oral/Motor/Sensory Function Overall Oral Motor/Sensory Function: Mild impairment Facial ROM: Within Functional Limits Facial Symmetry: Abnormal symmetry left;Suspected CN VII (facial) dysfunction Facial Strength: Within Functional Limits Facial Sensation: Within Functional Limits Lingual ROM: Within Functional Limits Lingual Symmetry: Within Functional Limits Lingual Strength: Within Functional Limits Lingual Sensation: Within Functional Limits Velum: Within Functional Limits   Ice Chips Ice chips: Not tested   Thin Liquid Thin Liquid: Within functional limits Presentation: Cup;Straw;Self Fed    Nectar Thick Nectar Thick Liquid: Not tested   Honey Thick Honey Thick Liquid: Not tested   Puree Puree: Within functional limits Presentation: Spoon   Solid     Solid: Within functional limits      [name], [name] 06/18/2018,1:39  PM

## 2018-06-18 NOTE — Progress Notes (Signed)
  Echocardiogram 2D Echocardiogram has been performed.  [name] [name] 06/18/2018, 12:53 PM

## 2018-06-18 NOTE — Progress Notes (Signed)
NAME:  MAUDE SPITALE, MRN:  [id number], DOB:  1955/10/19, LOS: 1 ADMISSION DATE:  06/17/2018, CONSULTATION DATE:  06/17/2018 REFERRING MD:  Dr. Lockie Mola, CHIEF COMPLAINT:  PE   Brief History   62 year old female hx CVA, COPD, who resides in SNF after recent ICH / IVH with L sided weakness. Now presenting with hypoxia and PE. Started on heparin infusion.   Past Medical History  Atrial fibrillation, COPD, CHF, SVA, IVH 03/2018  Significant Hospital Events   10/9 admit  Consults: date of consult/date signed off & final recs:  PCCM 10/9 > Neurology 10/9 >  Procedures (surgical and bedside):  Echocardiogram 10/10 >>   Significant Diagnostic Tests:  10/9 CTA PE >> Positive for acute PE primarily within the right pulmonary arterial system with CT evidence of right heart strain (RV/LV Ratio is greater than 1). Mild alveolar edema is noted particularly in the right upper lobe. Small effusions and bibasilar dependent atelectatic changes. Aortic Atherosclerosis and Emphysema  Head Ct 10/9 >> IVH resolved, chronic right MCA infarct, no acute abnormality   Micro Data:  10/9 BCx 2 >> 10/9 UC >>  Antimicrobials:  Ceftriaxone 10/9 >  Subjective:  A bit slow to respond but denies any discomfort, dyspnea Has not required any further BiPAP  Objective   Blood pressure (!) 82/61, pulse (!) 47, temperature (!) 96.8 F (36 C), temperature source Axillary, resp. rate 17, height 5\' 4"  (1.626 m), weight 68 kg, SpO2 (!) 85 %.    FiO2 (%):  [50 %] 50 %   Intake/Output Summary (Last 24 hours) at 06/18/2018 1053 Last data filed at 06/18/2018 1000 Gross per 24 hour  Intake 1090.53 ml  Output 600 ml  Net 490.53 ml   Filed Weights   06/17/18 1441 06/18/18 0409  Weight: 68 kg 68 kg    Examination: General: Chronically ill elderly woman, no distress on oxygen cannula HENT: Normocephalic with no oral lesions or secretions Lungs: Distant bilaterally but no wheezing Cardiovascular: Irregularly  irregular, heart rate 90s Abdomen: Soft, nondistended, positive bowel sounds Extremities: Left upper extremity and lower extremity are both contracted Neuro: Wakes easily to voice but slow to respond to questions, oriented x2, moves her right upper and lower extremity, can barely move her left arm, contracted GU: No Foley at this time  Resolved Hospital Problem list     Assessment & Plan:   Acute PE: Right sided with increased RV/LV ratio.Has been off anticoagulation since ICH in July.  Hypoxemic respiratory failure on presentation requiring BiPAP -Continue heparin infusion, suspect we can start her on enteral anticoagulation given reassuring CT head -Neurology was consulted regarding any contraindications regarding anticoagulation -Echocardiogram pending -No indication for targeted lytics -BiPAP off, wean nasal cannula O2 as tolerated  Atrial fibrillation with RVR, now controlled rate: prescribed diltiazem and metoprolol, but neither listed on SNF MAR. -Telemetry monitoring -Additional volume resuscitation 10/10 and follow blood pressure, heart rate -May need to restart either diltiazem or metoprolol as an outpatient when/if blood pressure will allow  History hypertension, currently relative hypotension -Holding home lisinopril  Probable urinary tract infection -Continue ceftriaxone for now pending culture data, likely finish 5 days  AKI Hypomagnesemia -Repeat IV fluid bolus 10/10 -Follow BMP, urine output -Replace electro lites as indicated -She is having some urinary retention, required I/oh cath once.  May need to repeat.  If continued issue could require Foley  COPD without acute exacerbation -Scheduled Pulmicort -Xopenex, Atrovent changed to 4 times daily -No role steroids  at this time  CAD with history of CABG Hyperlipidemia -Continue telemetry monitoring -Restart Lipitor when taking good p.o.  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  Resolved on CT head this admission -Appreciate neurology advice.  Suspect that there will be no contraindication anticoagulation  Pressure injuries left heel and left elbow -WOC input today  Disposition / Summary of Today's Plan 06/18/18   Admit to ICU for close monitoring    Diet: SLP evaluation pending, currently n.p.o. Pain/Anxiety/Delirium protocol (if indicated): n/a VAP protocol (if indicated): n/a DVT prophylaxis: full dose heparin GI prophylaxis: Pepcid Hyperglycemia protocol:  Mobility: bed rest Code Status: Full Family Communication: Son updated on 10/9  Labs   CBC: Recent Labs  Lab 06/17/18 1515 06/18/18 0726  WBC 6.3 6.1  NEUTROABS 4.6  --   HGB 10.2* 9.4*  HCT 32.3* 31.9*  MCV 99.1 100.6*  PLT 253 242    Basic Metabolic Panel: Recent Labs  Lab 06/17/18 1515 06/18/18 0308  NA 139 137  K 3.5 3.9  CL 103 106  CO2 25 22  GLUCOSE 91 145*  BUN 20 18  CREATININE 1.42* 1.34*  CALCIUM 7.8* 7.2*  MG  --  0.9*  PHOS  --  3.7   GFR: Estimated Creatinine Clearance: 42.3 mL/min (A) (by C-G formula based on SCr of 1.34 mg/dL (H)). Recent Labs  Lab 06/17/18 1515 06/17/18 1706 06/18/18 0308 06/18/18 0726  PROCALCITON  --   --  <0.10  --   WBC 6.3  --   --  6.1  LATICACIDVEN  --  2.34*  --  2.1*    Liver Function Tests: Recent Labs  Lab 06/17/18 1515  AST 19  ALT 9  ALKPHOS 85  BILITOT 0.6  PROT 5.8*  ALBUMIN 2.5*   No results for input(s): LIPASE, AMYLASE in the last 168 hours. No results for input(s): AMMONIA in the last 168 hours.  ABG    Component Value Date/Time   PHART 7.367 11/10/2014 1420   PCO2ART 34.3 (L) 11/10/2014 1420   PO2ART 77.0 (L) 11/10/2014 1420   HCO3 24.2 06/17/2018 1551   TCO2 26 06/17/2018 1551   ACIDBASEDEF 2.0 06/17/2018 1551   O2SAT 53.0 06/17/2018 1551     Coagulation Profile: No results for input(s): INR, PROTIME in the last 168 hours.  Cardiac Enzymes: No results for input(s): CKTOTAL, CKMB,  CKMBINDEX, TROPONINI in the last 168 hours.  HbA1C: Hgb A1c MFr Bld  Date/Time Value Ref Range Status  03/21/2018 06:50 AM 5.1 4.8 - 5.6 % Final    Comment:    (NOTE) Pre diabetes:          5.7%-6.4% Diabetes:              >6.4% Glycemic control for   <7.0% adults with diabetes   02/11/2011 06:00 AM  <5.7 % Final   5.4 (NOTE)                                                                       According to the ADA Clinical Practice Recommendations for 2011, when HbA1c is used as a screening test:   >=6.5%   Diagnostic of Diabetes Mellitus           (  if abnormal result  is confirmed)  5.7-6.4%   Increased risk of developing Diabetes Mellitus  References:Diagnosis and Classification of Diabetes Mellitus,Diabetes Care,2011,34(Suppl 1):S62-S69 and Standards of Medical Care in         Diabetes - 2011,Diabetes Care,2011,34  (Suppl 1):S11-S61.    CBG: No results for input(s): GLUCAP in the last 168 hours.  Admitting History of Present Illness.   62 year old female with extensive PMH significant for but not limited to tobacco abuse, COPD, sleep apnea, systolic HF, A. Fib (no AC), CVA, GERD, HTN, Hypothyroidism, and dementia presenting from [facility] for dry cough and shortness of breath with recent mild lower extremity edema.  Staff had difficulty obtaining O2 sats and found by EMS with O2 sat of 76% on room air with rales and rhonchi.  No reports of fever, chest pain, nausea, vomiting.  Normal appetite.   In ER, labs noted for neg troponin, sCr 1.42, VBG 7.3/ 45/ 30, Hgb 10.2, normal WBC, lactic 2.34, normal LFTs, . UTI noted for moderate leukocytes, bacteria, mucous, and WBC> 50.  CXR without pulmonary edema or obvious infiltrate, noted for stable cardiomegaly. She was persistently hypoxic requiring ongoing BiPAP and hypotensive requiring fluid bolus.  CTA PE positive, noted for R PE with RV/ LV ratio 1.  PCCM consulted for admission.   Independent CC time 34 minutes   Levy Pupa, MD, PhD 06/18/2018, 11:20 AM [facility] [phone] or if no answer [phone]

## 2018-06-18 NOTE — Progress Notes (Signed)
ANTICOAGULATION CONSULT NOTE  Pharmacy Consult for Heparin Indication: pulmonary embolus  Allergies  Allergen Reactions  . Avelox [Moxifloxacin Hcl In Nacl] Other (See Comments)    Mental breakdown  . Amoxicillin Hives  . Pamelor [Nortriptyline Hcl] Other (See Comments)    Made her want to hurt herself  . Penicillins Hives     Has patient had a PCN reaction causing immediate rash, facial/tongue/throat swelling, SOB or lightheadedness with hypotension: Yes Has patient had a PCN reaction causing severe rash involving mucus membranes or skin necrosis: Unk Has patient had a PCN reaction that required hospitalization: Unk Has patient had a PCN reaction occurring within the last 10 years: Unk If all of the above answers are "NO", then may proceed with Cephalosporin use.   . Other Itching    Any med that ends in -caine  . Hydrocodone Itching  . Oxycodone Itching  . Sulfa Antibiotics Other (See Comments)    "Allergic," per [facility]    Patient Measurements: Height: 5\' 4"  (162.6 cm) Weight: 149 lb 14.6 oz (68 kg) IBW/kg (Calculated) : 54.7 Heparin Dosing Weight: 68 kg  Vital Signs: Temp: 96.8 F (36 C) (10/10 0808) Temp Source: Axillary (10/10 0808) BP: 116/72 (10/10 1300) Pulse Rate: 54 (10/10 1300)  Labs: Recent Labs    06/17/18 1515 06/18/18 0308 06/18/18 0726 06/18/18 1329  HGB 10.2*  --  9.4*  --   HCT 32.3*  --  31.9*  --   PLT 253  --  242  --   HEPARINUNFRC  --  1.60*  --  1.30*  CREATININE 1.42* 1.34*  --   --     Estimated Creatinine Clearance: 42.3 mL/min (A) (by C-G formula based on SCr of 1.34 mg/dL (H)).   Assessment: 43 YOF presents with SOB and CTA shows acute PE with RHS.  Pharmacy has been consulted to manage IV heparin.  Patient is not on anticoagulation PTA.   Heparin level remains supra-therapeutic.  Unable to confirm whether lab is drawn appropriately per RN, but lab trend is consistent with previous value.  Noted patient has a recent history of  ICH, but CT on admit shows resolution.  No bleeding per RN.   Goal of Therapy:  Heparin level 0.3-0.7 units/ml Monitor platelets by anticoagulation protocol: Yes    Plan:  Hold heparin for 1.5 hours (informed RN at 1500), then resume at 650 units/hr Check 6 hr heparin level Daily heparin level and CBC Monitor closely for s/sx of bleeding   Danashia Landers D. Laney Potash, PharmD, BCPS, BCCCP 06/18/2018, 3:01 PM

## 2018-06-18 NOTE — Progress Notes (Signed)
eLink Physician-Brief Progress Note Patient Name: [name] DOB: 1956-08-09 MRN: [id number]   Date of Service  06/18/2018  HPI/Events of Note  Hypomagnesemia , Hypotension  eICU Interventions  Magnesium iv replacement with 6 gm Mg 2+ over six hours, 250 ml normal saline infusion over 30 minutes        Shmuel Girgis U [name] 06/18/2018, 4:32 AM

## 2018-06-18 NOTE — Progress Notes (Signed)
CRITICAL VALUE ALERT  Critical Value:  0.9 magnesium   Date & Time Notied:  06/18/2018 2831  Provider Notified: Pola Corn  Orders Received/Actions taken: awaiting further orders.   Additionally, notified of hypotension. Systolic's mid 80's

## 2018-06-18 NOTE — Consult Note (Addendum)
WOC Nurse wound consult note Reason for Consult:left elbow unstageable wound, left posterior heel blood filled blister,DTI Wound type:pressure and shear Pressure Injury POA: Yes Measurement:left elbow 1.5cm round, unknown depth due to 100% black eschar, no drainage, no odor. Left posterior heel 2cm x 1.5cm blood filled bulla, full thickness pressure wound, no drainage, no odor. Wound bed:see above Drainage (amount, consistency, odor) see above Periwound:see above Dressing procedure/placement/frequency:I have provided nurses with orders for left elbow wound, peel foam dressing, paint with Betadine, let dry, replace foam for protection. To left posterior heel, foam dressing to keep bulla intact and protect heel. Pt has reddened area where current appliance on her left leg is rubbing. Foam to lateral foot at base of left great toe. Pt is malnourished, Nutritional consult already in place. We will not follow, but will remain available to this patient, to nursing, and the medical and/or surgical teams.  Please re-consult if we need to assist further.  Barnett Hatter, RN-C, WTA-C Wound Treatment Associate

## 2018-06-18 NOTE — Progress Notes (Signed)
Initial Nutrition Assessment  DOCUMENTATION CODES:   Non-severe (moderate) malnutrition in context of chronic illness  INTERVENTION:   - Ensure Enlive po BID, each supplement provides 350 kcal and 20 grams of protein (chocolate or vanilla flavors)  - 1 packet Juven BID, each packet provides 80 calories, 8 grams of carbohydrate, and 14 grams of amino acids; supplement contains CaHMB, glutamine, and arginine, to promote wound healing  - Recommend Ocuvite daily for wound healing  - Encourage adequate PO intake  NUTRITION DIAGNOSIS:   Moderate Malnutrition related to chronic illness (dementia, COPD, CKD stage III) as evidenced by mild fat depletion, mild muscle depletion, percent weight loss (12.5% weight loss in 3 months).  GOAL:   Patient will meet greater than or equal to 90% of their needs  MONITOR:   PO intake, Supplement acceptance, Weight trends, Skin, Labs  REASON FOR ASSESSMENT:   Malnutrition Screening Tool    ASSESSMENT:   62 year old female who presented to the ED on 10/9 from [facility] with cough and SOB. Pt admitted with PE. PMH significant for dementia, CAD, hypertension, hyperlipidemia, COPD, CKD stage III, hypothyroidism, stroke, depression, atrial fibrillation, and GERD.   Spoke with pt at bedside. WOC RN in room providing nursing care at time of visit. SLP entering room for swallow evaluation as RD was leaving. Pt now on Regular diet.  Pt states that she does not have a good appetite and that this has been the case "for most of my life." Pt states that she attributes this to "upbringing" and did not elaborate further.  Pt shares that she eats 3 meals daily as well as snacks. Pt reports drinking water, coffee, and regular or caffeine-free Coke throughout the day.  Breakfast: bacon on a biscuit (eats 100% of bacon, ~50% of biscuit), coffee Lunch: [place] sandwich (may have bacon on the sandwich) Dinner: 1-2 slices of pizza Snacks: pretzels  Pt states  that she has consumed oral nutrition supplements in the past but that recently "I haven't tolerated them." Pt denies diarrhea or nausea but states, "I just haven't wanted to eat them." Pt is agreeable to having vanilla or chocolate Ensure Enlive during admission. RD to order.  Pt reports that she has been progressively losing weight over time. Pt states that she used to "need to lose weight" and that her highest weight was 300 lbs. Pt states that initially, her weight loss was intentional and that she lost down to 232 lbs in 2014. Pt reports that at this time, she began experiencing unintentional weight loss down to 160 lbs. Pt states that this is due to "not eating."  Per weight history in chart, pt has lost 21.3 lbs over the past 3 months. This is a 12.5% weight loss which is significant for timeframe.  Wt Readings from Last 10 Encounters:  06/18/18 68 kg  05/06/18 68 kg  04/27/18 68.2 kg  04/01/18 72.8 kg  03/18/18 77.7 kg  03/22/18 77.6 kg  06/11/17 74.8 kg  04/16/17 76.4 kg  03/27/17 77.1 kg  02/15/17 75.8 kg   Medications reviewed and include: IV Pepcid 20 mg BID, IV antibiotics, NaCl @ 10 ml/hr, IV magnesium sulfate 6 grams once  Labs reviewed: magnesium 0.9 (L) - repleted, creatinine 1.34 (H), hemoglobin 9.4 (L), HCT 31. (L)  NUTRITION - FOCUSED PHYSICAL EXAM:    Most Recent Value  Orbital Region  Mild depletion  Upper Arm Region  No depletion  Thoracic and Lumbar Region  No depletion  Buccal Region  Mild  depletion  [employer]  No depletion  Clavicle Bone Region  Mild depletion  Clavicle and Acromion Bone Region  Mild depletion  Scapular Bone Region  Unable to assess  Dorsal Hand  No depletion  Patellar Region  Mild depletion  Anterior Thigh Region  Mild depletion  Posterior Calf Region  Mild depletion  Edema (RD Assessment)  None  Hair  Reviewed  Eyes  Reviewed  Mouth  Reviewed  Skin  Reviewed  Nails  Reviewed       Diet Order:   Diet Order             Diet regular Room service appropriate? Yes; Fluid consistency: Thin  Diet effective now              EDUCATION NEEDS:   No education needs have been identified at this time  Skin:  Skin Assessment: Skin Integrity Issues: DTI: L elbow Unstageable: L ankle  Last BM:  PTA/unknown  Height:   Ht Readings from Last 1 Encounters:  06/17/18 5\' 4"  (1.626 m)    Weight:   Wt Readings from Last 1 Encounters:  06/18/18 68 kg    Ideal Body Weight:  54.55 kg  BMI:  Body mass index is 25.73 kg/m.  Estimated Nutritional Needs:   Kcal:  1700-1900  Protein:  85-100 grams  Fluid:  1.7-1.9 L    Earma Reading, MS, RD, LDN Inpatient Clinical Dietitian Pager: [phone] Weekend/After Hours: [phone]

## 2018-06-18 NOTE — Progress Notes (Signed)
ANTICOAGULATION CONSULT NOTE   Pharmacy Consult for Heparin Indication: pulmonary embolus  Allergies  Allergen Reactions  . Avelox [Moxifloxacin Hcl In Nacl] Other (See Comments)    Mental breakdown  . Amoxicillin Hives  . Pamelor [Nortriptyline Hcl] Other (See Comments)    Made her want to hurt herself  . Penicillins Hives     Has patient had a PCN reaction causing immediate rash, facial/tongue/throat swelling, SOB or lightheadedness with hypotension: Yes Has patient had a PCN reaction causing severe rash involving mucus membranes or skin necrosis: Unk Has patient had a PCN reaction that required hospitalization: Unk Has patient had a PCN reaction occurring within the last 10 years: Unk If all of the above answers are "NO", then may proceed with Cephalosporin use.   . Other Itching    Any med that ends in -caine  . Hydrocodone Itching  . Oxycodone Itching  . Sulfa Antibiotics Other (See Comments)    "Allergic," per [facility]    Patient Measurements: Height: 5\' 4"  (162.6 cm) Weight: 149 lb 14.6 oz (68 kg) IBW/kg (Calculated) : 54.7 Heparin Dosing Weight: 68 kg  Vital Signs: Temp: 97.8 F (36.6 C) (10/10 0000) Temp Source: Oral (10/10 0000) BP: 86/66 (10/10 0400) Pulse Rate: 58 (10/10 0300)  Labs: Recent Labs    06/17/18 1515 06/18/18 0308  HGB 10.2*  --   HCT 32.3*  --   PLT 253  --   HEPARINUNFRC  --  1.60*  CREATININE 1.42* 1.34*    Estimated Creatinine Clearance: 42.3 mL/min (A) (by C-G formula based on SCr of 1.34 mg/dL (H)).   Medical History: Past Medical History:  Diagnosis Date  . Acute right MCA stroke (HCC) 11/07/10  . Atrial fibrillation (HCC)    a. s/p TEE-DCCV 02/2104; b. Xarelto started  . CAD (coronary artery disease)    a.  cath 09/2010: LAD stent patent, S-Int/dCFX ok, S-PDA ok, L-LAD atretic;  b. Lexiscan Myoview (02/2014):  no ischemia, EF 55%; c. 11/2014 Cath/PCI: LM nl, LAD 20pISR, LCX 80-22m, OM1 nl, RI 70p, RCA 40-38m, RPDA 95ost/95-35m (PTCA  only w/ reduction to 50p/85m), 60d, L->LAD atretic, VG->RI->OM nl, VG->PDA 100p.  . Cardiomyopathy with EF 40% at TEE 02/17/14 (likely tachycardia mediated - Myoview 02/19/14 neg for ischemia with normal EF) 02/18/2014  . COPD (chronic obstructive pulmonary disease) (HCC)   . Depression   . Eczema   . GERD (gastroesophageal reflux disease)   . Headache   . HLD (hyperlipidemia)   . Homelessness 11/12/2011  . 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 Hx of CABG   . Hx of transesophageal echocardiography (TEE) for monitoring 11/2010   TEE 11/2010: EF 60-65%, BAE, trivial atrial septal shunt;  right heart cath in 10/2010 with elevated R and L heart pressures and diuretic started  . Hypertension   . Hypothyroidism   . Persistent atrial fibrillation 10/2014  . Pulmonary nodules    repeat CT due in 11/2011  . Sleep apnea    recent sleep study  in 04/2014 per chart review  shows no significant OSA    Assessment: 62 yo F presents with SOB. CT angio shows acute PE with heart strain. No anticoag PTA. Hgb 10.2, plts wnl. Pharmacy consulted to start heparin.  10/10 AM update: heparin level is supra-therapeutic, lab drawn from arm opposite heparin infusion, no issues per RN.  Goal of Therapy:  Heparin level 0.3-0.7 units/ml Monitor platelets by anticoagulation protocol: Yes   Plan:  Hold heparin x 1 hr Re-start heparin at 950 units/hr at 0530 1300 HL Monitor daily heparin level, CBC, s/s of bleed   Abran Duke 06/18/2018,4:17 AM

## 2018-06-19 DIAGNOSIS — N390 Urinary tract infection, site not specified: Secondary | ICD-10-CM

## 2018-06-19 LAB — BASIC METABOLIC PANEL
Anion gap: 7 (ref 5–15)
BUN: 12 mg/dL (ref 6–20)
CALCIUM: 7.5 mg/dL — AB (ref 8.9–10.3)
CO2: 23 mmol/L (ref 22–32)
CREATININE: 1 mg/dL (ref 0.44–1.00)
Chloride: 108 mmol/L (ref 98–111)
GFR calc non Af Amer: 60 mL/min — ABNORMAL LOW (ref >60)
GLUCOSE: 89 mg/dL (ref 70–99)
Potassium: 3.5 mmol/L (ref 3.5–5.1)
Sodium: 138 mmol/L (ref 135–145)

## 2018-06-19 LAB — CBC
HEMATOCRIT: 29.2 % — AB (ref 36.0–46.0)
Hemoglobin: 9.1 g/dL — ABNORMAL LOW (ref 12.0–15.0)
MCH: 30.5 pg (ref 26.0–34.0)
MCHC: 31.2 g/dL (ref 30.0–36.0)
MCV: 98 fL (ref 80.0–100.0)
PLATELETS: 258 10*3/uL (ref 150–400)
RBC: 2.98 MIL/uL — ABNORMAL LOW (ref 3.87–5.11)
RDW: 15.1 % (ref 11.5–15.5)
WBC: 6.7 10*3/uL (ref 4.0–10.5)
nRBC: 0 % (ref 0.0–0.2)

## 2018-06-19 LAB — HEPARIN LEVEL (UNFRACTIONATED)
HEPARIN UNFRACTIONATED: 0.41 [IU]/mL (ref 0.30–0.70)
Heparin Unfractionated: <0.1 IU/mL — ABNORMAL LOW (ref 0.30–0.70)

## 2018-06-19 LAB — MAGNESIUM: Magnesium: 2.5 mg/dL — ABNORMAL HIGH (ref 1.7–2.4)

## 2018-06-19 MED ORDER — METOPROLOL TARTRATE 12.5 MG HALF TABLET
12.5000 mg | ORAL_TABLET | Freq: Two times a day (BID) | ORAL | Status: DC
  Administered 2018-06-19 – 2018-06-21 (×3): 12.5 mg via ORAL
  Filled 2018-06-19 (×4): qty 1

## 2018-06-19 MED ORDER — DILTIAZEM HCL-DEXTROSE 100-5 MG/100ML-% IV SOLN (PREMIX)
5.0000 mg/h | INTRAVENOUS | Status: AC
  Administered 2018-06-19: 5 mg/h via INTRAVENOUS
  Filled 2018-06-19 (×3): qty 100

## 2018-06-19 MED ORDER — DILTIAZEM LOAD VIA INFUSION
10.0000 mg | Freq: Once | INTRAVENOUS | Status: AC
  Administered 2018-06-19: 10 mg via INTRAVENOUS
  Filled 2018-06-19: qty 10

## 2018-06-19 MED ORDER — FAMOTIDINE 20 MG PO TABS
20.0000 mg | ORAL_TABLET | Freq: Two times a day (BID) | ORAL | Status: DC
  Administered 2018-06-19 – 2018-06-21 (×4): 20 mg via ORAL
  Filled 2018-06-19 (×5): qty 1

## 2018-06-19 MED ORDER — IPRATROPIUM BROMIDE 0.02 % IN SOLN
0.5000 mg | Freq: Three times a day (TID) | RESPIRATORY_TRACT | Status: DC
  Administered 2018-06-19 – 2018-06-21 (×6): 0.5 mg via RESPIRATORY_TRACT
  Filled 2018-06-19 (×6): qty 2.5

## 2018-06-19 MED ORDER — LORAZEPAM 2 MG/ML IJ SOLN
0.5000 mg | INTRAMUSCULAR | Status: DC | PRN
  Administered 2018-06-19 – 2018-06-21 (×7): 1 mg via INTRAVENOUS
  Filled 2018-06-19 (×7): qty 1

## 2018-06-19 MED ORDER — FLUOXETINE HCL 20 MG PO CAPS
20.0000 mg | ORAL_CAPSULE | Freq: Every day | ORAL | Status: DC
  Administered 2018-06-19 – 2018-06-21 (×3): 20 mg via ORAL
  Filled 2018-06-19 (×3): qty 1

## 2018-06-19 MED ORDER — LEVALBUTEROL HCL 0.63 MG/3ML IN NEBU
0.6300 mg | INHALATION_SOLUTION | Freq: Three times a day (TID) | RESPIRATORY_TRACT | Status: DC
  Administered 2018-06-19 – 2018-06-21 (×6): 0.63 mg via RESPIRATORY_TRACT
  Filled 2018-06-19 (×6): qty 3

## 2018-06-19 MED ORDER — METOPROLOL TARTRATE 5 MG/5ML IV SOLN
10.0000 mg | Freq: Once | INTRAVENOUS | Status: AC
  Administered 2018-06-19: 10 mg via INTRAVENOUS
  Filled 2018-06-19: qty 10

## 2018-06-19 MED ORDER — ONDANSETRON HCL 4 MG/2ML IJ SOLN
4.0000 mg | Freq: Four times a day (QID) | INTRAMUSCULAR | Status: DC | PRN
  Administered 2018-06-19: 4 mg via INTRAVENOUS
  Filled 2018-06-19: qty 2

## 2018-06-19 NOTE — Plan of Care (Signed)
  Problem: Clinical Measurements: Goal: Cardiovascular complication will be avoided Outcome: Progressing   Problem: Nutrition: Goal: Adequate nutrition will be maintained Outcome: Progressing   Problem: Skin Integrity: Goal: Risk for impaired skin integrity will decrease Outcome: Progressing   Problem: Elimination: Goal: Will not experience complications related to bowel motility Outcome: Not Progressing    Patient has not had a bowel movement in several days. Will continue bowel regimen.  Goal: Will not experience complications related to urinary retention Outcome: Not Progressing         Patient required placement of foley d/t repeated incidence of retention.

## 2018-06-19 NOTE — Progress Notes (Signed)
[facility] TEAM 1 - Stepdown/ICU TEAM  [name]  WGN:[id number] DOB: 1955-11-24 DOA: 06/17/2018 PCP: Madelin Headings, MD    Brief Narrative:  62 year old female w/ a hx of tobacco abuse, dementia, sleep apnea, systolic CHF, HTN, Hypothyroidism, CVA, and COPD who resides in a SNF after a recent ICH / IVH resulting in L sided weakness who presented with hypoxia and and was found to have PE.    Significant Events: 10/9 admit 10/11 [facility] assumed care  Subjective: Having trouble w/ RVR today. Is alert. Denies cp, n/v, abdom pain, or sob.   Assessment & Plan:  Acute R PE - increased RV/LV ratio - Hypoxic resp failure  off anticoagulation since ICH in July - continue heparin infusion for now - lytic tx was not indicated   Atrial fibrillation with RVR Was previously prescribed diltiazem and metoprolol, but neither listed on SNF MAR - w/ RVR today will add scheduled lopressor - if that does not work may require cardizem gtt   HTN Controlled at this time   Klebsiella urinary tract infection Continue ceftriaxone to finish 5 days - f/u sensitivites   AKI Resolving w/ volume expansion - follow trend  Hypomagnesemia Corrected w/ supplementation   Urinary retention required I/O cath in serial fashion - foley placed 10/11  COPD without acute exacerbation Change to Xopenex so as not to exacerbate RVR  CAD with history of CABG  Hyperlipidemia  History of right MCA stroke with residual L sided weakness  IVH July 2019 resolved on CT head this admission  Pressure injuries left heel and left elbow WOC suggestions being followed   DVT prophylaxis: IV heparin  Code Status: FULL CODE Family Communication: no family present at time of exam  Disposition Plan: SDU  Consultants:  PCCM  Antimicrobials:  Ceftriaxone 10/9 >  Objective: Blood pressure (!) 123/96, pulse (!) 113, temperature 98.3 F (36.8 C), temperature source Oral, resp. rate 15, height 5\' 4"  (1.626 m),  weight 70.3 kg, SpO2 100 %.  Intake/Output Summary (Last 24 hours) at 06/19/2018 1335 Last data filed at 06/19/2018 1200 Gross per 24 hour  Intake 1246.22 ml  Output 2020 ml  Net -773.78 ml   Filed Weights   06/17/18 1441 06/18/18 0409 06/19/18 0500  Weight: 68 kg 68 kg 70.3 kg    Examination: General: No acute respiratory distress Lungs: Clear to auscultation bilaterally without wheezes or crackles Cardiovascular: irreg irreg - rate 135bpm  Abdomen: Nontender, nondistended, soft, bowel sounds positive, no rebound, no ascites, no appreciable mass Extremities: 1+ R LE edema   CBC: Recent Labs  Lab 06/17/18 1515 06/18/18 0726 06/19/18 0336  WBC 6.3 6.1 6.7  NEUTROABS 4.6  --   --   HGB 10.2* 9.4* 9.1*  HCT 32.3* 31.9* 29.2*  MCV 99.1 100.6* 98.0  PLT 253 242 258   Basic Metabolic Panel: Recent Labs  Lab 06/17/18 1515 06/18/18 0308 06/18/18 1525 06/19/18 0336  NA 139 137  --  138  K 3.5 3.9  --  3.5  CL 103 106  --  108  CO2 25 22  --  23  GLUCOSE 91 145*  --  89  BUN 20 18  --  12  CREATININE 1.42* 1.34*  --  1.00  CALCIUM 7.8* 7.2*  --  7.5*  MG  --  0.9* 3.1* 2.5*  PHOS  --  3.7  --   --    GFR: Estimated Creatinine Clearance: 57.5 mL/min (by C-G formula  based on SCr of 1 mg/dL).  Liver Function Tests: Recent Labs  Lab 06/17/18 1515  AST 19  ALT 9  ALKPHOS 85  BILITOT 0.6  PROT 5.8*  ALBUMIN 2.5*    HbA1C: Hgb A1c MFr Bld  Date/Time Value Ref Range Status  03/21/2018 06:50 AM 5.1 4.8 - 5.6 % Final    Comment:    (NOTE) Pre diabetes:          5.7%-6.4% Diabetes:              >6.4% Glycemic control for   <7.0% adults with diabetes   02/11/2011 06:00 AM  <5.7 % Final   5.4 (NOTE)                                                                       According to the ADA Clinical Practice Recommendations for 2011, when HbA1c is used as a screening test:   >=6.5%   Diagnostic of Diabetes Mellitus           (if abnormal result  is confirmed)   5.7-6.4%   Increased risk of developing Diabetes Mellitus  References:Diagnosis and Classification of Diabetes Mellitus,Diabetes Care,2011,34(Suppl 1):S62-S69 and Standards of Medical Care in         Diabetes - 2011,Diabetes Care,2011,34  (Suppl 1):S11-S61.    Recent Results (from the past 240 hour(s))  Blood Culture (routine x 2)     Status: None (Preliminary result)   Collection Time: 06/17/18  3:13 PM  Result Value Ref Range Status   Specimen Description BLOOD SITE NOT SPECIFIED  Final   Special Requests   Final    BOTTLES DRAWN AEROBIC AND ANAEROBIC Blood Culture adequate volume   Culture   Final    NO GROWTH < 24 HOURS Performed at [facility], [address] [place], [place] [address]    Report Status PENDING  Incomplete  Blood Culture (routine x 2)     Status: None (Preliminary result)   Collection Time: 06/17/18  3:45 PM  Result Value Ref Range Status   Specimen Description BLOOD LEFT ANTECUBITAL  Final   Special Requests   Final    BOTTLES DRAWN AEROBIC ONLY Blood Culture results may not be optimal due to an inadequate volume of blood received in culture bottles   Culture   Final    NO GROWTH < 24 HOURS Performed at [facility], [address], [place], [place] [address]    Report Status PENDING  Incomplete  Culture, Urine     Status: Abnormal (Preliminary result)   Collection Time: 06/17/18  5:25 PM  Result Value Ref Range Status   Specimen Description URINE, RANDOM  Final   Special Requests   Final    NONE Performed at [facility], [address], [place], [place] [address]    Culture >=100,000 COLONIES/mL KLEBSIELLA PNEUMONIAE (A)  Final   Report Status PENDING  Incomplete  MRSA PCR Screening     Status: None   Collection Time: 06/17/18 10:30 PM  Result Value Ref Range Status   MRSA by PCR NEGATIVE NEGATIVE Final    Comment:        The GeneXpert MRSA Assay (FDA approved for NASAL specimens  only), is one component of a comprehensive MRSA  colonization surveillance program. It is not intended to diagnose MRSA infection nor to guide or monitor treatment for MRSA infections. Performed at [facility], [address], [place], [place] [address]      Scheduled Meds: . budesonide (PULMICORT) nebulizer solution  0.5 mg Nebulization BID  . feeding supplement (ENSURE ENLIVE)  237 mL Oral BID BM  . ipratropium  0.5 mg Nebulization TID  . levalbuterol  0.63 mg Nebulization TID  . nutrition supplement (JUVEN)  1 packet Oral BID BM  . povidone-iodine   Topical BID   Continuous Infusions: . sodium chloride 10 mL/hr at 06/19/18 1200  . cefTRIAXone (ROCEPHIN)  IV Stopped (06/19/18 0040)  . famotidine (PEPCID) IV Stopped (06/19/18 0948)  . heparin 450 Units/hr (06/19/18 1200)  . sodium chloride Stopped (06/18/18 0825)     LOS: 2 days   Lonia Blood, MD [facility] Office  [phone] Pager - Text Page per Amion  If 7PM-7AM, please contact night-coverage per Amion 06/19/2018, 1:35 PM

## 2018-06-19 NOTE — Progress Notes (Signed)
ANTICOAGULATION CONSULT NOTE   Pharmacy Consult for Heparin Indication: pulmonary embolus  Allergies  Allergen Reactions  . Avelox [Moxifloxacin Hcl In Nacl] Other (See Comments)    Mental breakdown  . Amoxicillin Hives  . Pamelor [Nortriptyline Hcl] Other (See Comments)    Made her want to hurt herself  . Penicillins Hives     Has patient had a PCN reaction causing immediate rash, facial/tongue/throat swelling, SOB or lightheadedness with hypotension: Yes Has patient had a PCN reaction causing severe rash involving mucus membranes or skin necrosis: Unk Has patient had a PCN reaction that required hospitalization: Unk Has patient had a PCN reaction occurring within the last 10 years: Unk If all of the above answers are "NO", then may proceed with Cephalosporin use.   . Other Itching    Any med that ends in -caine  . Hydrocodone Itching  . Oxycodone Itching  . Sulfa Antibiotics Other (See Comments)    "Allergic," per [facility]    Patient Measurements: Height: 5\' 4"  (162.6 cm) Weight: 149 lb 14.6 oz (68 kg) IBW/kg (Calculated) : 54.7 Heparin Dosing Weight: 68 kg  Vital Signs: Temp: 98.2 F (36.8 C) (10/10 1900) Temp Source: Oral (10/10 1900) BP: 115/87 (10/10 2100) Pulse Rate: 108 (10/10 2100)  Labs: Recent Labs    06/17/18 1515 06/18/18 0308 06/18/18 0726 06/18/18 1329 06/18/18 2224  HGB 10.2*  --  9.4*  --   --   HCT 32.3*  --  31.9*  --   --   PLT 253  --  242  --   --   HEPARINUNFRC  --  1.60*  --  1.30* 0.83*  CREATININE 1.42* 1.34*  --   --   --     Estimated Creatinine Clearance: 42.3 mL/min (A) (by C-G formula based on SCr of 1.34 mg/dL (H)).   Medical History: Past Medical History:  Diagnosis Date  . Acute right MCA stroke (HCC) 11/07/10  . Atrial fibrillation (HCC)    a. s/p TEE-DCCV 02/2104; b. Xarelto started  . CAD (coronary artery disease)    a.  cath 09/2010: LAD stent patent, S-Int/dCFX ok, S-PDA ok, L-LAD atretic;  b. Lexiscan Myoview (02/2014):   no ischemia, EF 55%; c. 11/2014 Cath/PCI: LM nl, LAD 20pISR, LCX 80-74m, OM1 nl, RI 70p, RCA 40-80m, RPDA 95ost/95-47m (PTCA only w/ reduction to 50p/68m), 60d, L->LAD atretic, VG->RI->OM nl, VG->PDA 100p.  . Cardiomyopathy with EF 40% at TEE 02/17/14 (likely tachycardia mediated - Myoview 02/19/14 neg for ischemia with normal EF) 02/18/2014  . COPD (chronic obstructive pulmonary disease) (HCC)   . Depression   . Eczema   . GERD (gastroesophageal reflux disease)   . Headache   . HLD (hyperlipidemia)   . Homelessness 11/12/2011  . 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 Hx of CABG   . Hx of transesophageal echocardiography (TEE) for monitoring 11/2010   TEE 11/2010: EF 60-65%, BAE, trivial atrial septal shunt;  right heart cath in 10/2010 with elevated R and L heart pressures and diuretic started  . Hypertension   . Hypothyroidism   . Persistent atrial fibrillation 10/2014  . Pulmonary nodules    repeat CT due in 11/2011  . Sleep apnea    recent sleep study  in 04/2014 per chart review  shows no significant OSA    Assessment: 62 yo F presents with SOB.  CT angio shows acute PE with heart strain. No anticoag PTA. Hgb 10.2, plts wnl. Pharmacy consulted to start heparin.  10/11 AM update: heparin level is supra-therapeutic but trending down after multiple rate decreases  Goal of Therapy:  Heparin level 0.3-0.7 units/ml Monitor platelets by anticoagulation protocol: Yes   Plan:  Dec heparin to 450 units/hr 0800 HL Monitor daily heparin level, CBC, s/s of bleed   Abran Duke 06/19/2018,12:03 AM

## 2018-06-19 NOTE — Progress Notes (Signed)
  Speech Language Pathology Treatment: Dysphagia  Patient Details Name: [name] MRN: [id number] DOB: 12-Apr-1956 Today's Date: 06/19/2018 Time: 1440-1450 SLP Time Calculation (min) (ACUTE ONLY): 10 min  Assessment / Plan / Recommendation Clinical Impression  Pt was seen for skilled ST targeting dysphagia goals.  Pt was partially reclined in bed due to restlessly moving about.  Pt was repositioned to maximize safety for PO intake.  Pt declined solids due to poor appetite but consumed thin liquids via straw sips with no overt s/s of aspiration.  RN reports good toleration of POs and ate 100% of her breakfast this morning without incident.  As a result, no further ST needs are indicated at this time.    HPI HPI: 62 year old female hx CVA, COPD, who resides in SNF after recent ICH / IVH with L sided weakness. Now presenting with hypoxia and PE. Started on heparin infusion. No histoyr of dysphagia from prior CVA      SLP Plan  Discharge SLP treatment due to (comment)(pt at baseline for swallowing function)       Recommendations  Diet recommendations: Regular;Thin liquid Liquids provided via: Cup;Straw Medication Administration: Whole meds with liquid Supervision: Patient able to self feed Compensations: Minimize environmental distractions;Slow rate;Small sips/bites Postural Changes and/or Swallow Maneuvers: Out of bed for meals;Seated upright 90 degrees;Upright 30-60 min after meal                Oral Care Recommendations: Oral care BID Follow up Recommendations: Skilled Nursing facility Plan: Discharge SLP treatment due to (comment)(pt at baseline for swallowing function)       GO                [name], [name] 06/19/2018, 2:52 PM

## 2018-06-19 NOTE — Consult Note (Signed)
   [facility] CM Inpatient Consult   06/19/2018  [name] [birth year] [id number]  Patient is currently active with [facility] Care Management for follow up at [facility] for transitional needs with [facility] CSW for possible long term skilled facility needs. .   Will follow for progress and disposition needs and alert [facility] Staff of ongoing needs as appropriate.    Chart has been reviewed and MD notes of HX and Physical as follows: Patient is a 62 year old female with extensive PMH significant for but not limited to tobacco abuse, COPD, sleep apnea, systolic HF, A. Fib (no AC), CVA, GERD, HTN, Hypothyroidism, and dementia presenting from [facility] SNF [corrected] for dry cough and shortness of breath with recent mild lower extremity edema.  Staff had difficulty obtaining O2 sats and found by EMS with O2 sat of 76% on room air with rales and rhonchi.   Updated inpatient RNCM, Silva Bandy, regarding patient being followed by [facility] Care Management.      Of note, [facility] Care Management services does not replace or interfere with any services that are needed or arranged by inpatient case management or social work.  For additional questions or referrals please contact:  Charlesetta Shanks, RN BSN CCM [facility]  [phone] business mobile phone Toll free office [phone]

## 2018-06-19 NOTE — Progress Notes (Signed)
ANTICOAGULATION CONSULT NOTE   Pharmacy Consult for Heparin Indication: pulmonary embolus  Allergies  Allergen Reactions  . Avelox [Moxifloxacin Hcl In Nacl] Other (See Comments)    Mental breakdown  . Amoxicillin Hives  . Pamelor [Nortriptyline Hcl] Other (See Comments)    Made her want to hurt herself  . Penicillins Hives     Has patient had a PCN reaction causing immediate rash, facial/tongue/throat swelling, SOB or lightheadedness with hypotension: Yes Has patient had a PCN reaction causing severe rash involving mucus membranes or skin necrosis: Unk Has patient had a PCN reaction that required hospitalization: Unk Has patient had a PCN reaction occurring within the last 10 years: Unk If all of the above answers are "NO", then may proceed with Cephalosporin use.   . Other Itching    Any med that ends in -caine  . Hydrocodone Itching  . Oxycodone Itching  . Sulfa Antibiotics Other (See Comments)    "Allergic," per [facility]    Patient Measurements: Height: 5\' 4"  (162.6 cm) Weight: 154 lb 15.7 oz (70.3 kg) IBW/kg (Calculated) : 54.7 Heparin Dosing Weight: 68 kg  Vital Signs: Temp: 97.7 F (36.5 C) (10/11 0727) Temp Source: Oral (10/11 0727) BP: 111/92 (10/11 0900) Pulse Rate: 81 (10/11 0900)  Labs: Recent Labs    06/17/18 1515  06/18/18 0308 06/18/18 0726 06/18/18 1329 06/18/18 2224 06/19/18 0336 06/19/18 0742  HGB 10.2*  --   --  9.4*  --   --  9.1*  --   HCT 32.3*  --   --  31.9*  --   --  29.2*  --   PLT 253  --   --  242  --   --  258  --   HEPARINUNFRC  --    < > 1.60*  --  1.30* 0.83*  --  0.41  CREATININE 1.42*  --  1.34*  --   --   --  1.00  --    < > = values in this interval not displayed.    Estimated Creatinine Clearance: 57.5 mL/min (by C-G formula based on SCr of 1 mg/dL).   Medical History: Past Medical History:  Diagnosis Date  . Acute right MCA stroke (HCC) 11/07/10  . Atrial fibrillation (HCC)    a. s/p TEE-DCCV 02/2104; b. Xarelto started   . CAD (coronary artery disease)    a.  cath 09/2010: LAD stent patent, S-Int/dCFX ok, S-PDA ok, L-LAD atretic;  b. Lexiscan Myoview (02/2014):  no ischemia, EF 55%; c. 11/2014 Cath/PCI: LM nl, LAD 20pISR, LCX 80-81m, OM1 nl, RI 70p, RCA 40-11m, RPDA 95ost/95-22m (PTCA only w/ reduction to 50p/55m), 60d, L->LAD atretic, VG->RI->OM nl, VG->PDA 100p.  . Cardiomyopathy with EF 40% at TEE 02/17/14 (likely tachycardia mediated - Myoview 02/19/14 neg for ischemia with normal EF) 02/18/2014  . COPD (chronic obstructive pulmonary disease) (HCC)   . Depression   . Eczema   . GERD (gastroesophageal reflux disease)   . Headache   . HLD (hyperlipidemia)   . Homelessness 11/12/2011  . 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 Hx of CABG   . Hx of transesophageal echocardiography (TEE) for monitoring 11/2010   TEE 11/2010: EF 60-65%, BAE, trivial atrial septal shunt;  right heart cath in 10/2010 with elevated R and L heart pressures and diuretic started  . Hypertension   . Hypothyroidism   .  Persistent atrial fibrillation 10/2014  . Pulmonary nodules    repeat CT due in 11/2011  . Sleep apnea    recent sleep study  in 04/2014 per chart review  shows no significant OSA    Assessment: 62 yo F presents with SOB. CT angio shows acute PE with heart strain. No anticoag PTA. Currently on IV heparin at 450 units/hr. HL this AM is therapeutic at 0.41. Hgb down slightly, plts wnl.   Goal of Therapy:  Heparin level 0.3-0.7 units/ml Monitor platelets by anticoagulation protocol: Yes   Plan:  Continue heparin at 450 units/hr 1400 confirmatory HL  Monitor daily heparin level, CBC, s/s of bleed  Vinnie Level, PharmD., BCPS Clinical Pharmacist Clinical phone for 06/19/18 until 3:30pm: [phone] If after 3:30pm, please refer to [facility] for unit-specific pharmacist

## 2018-06-19 NOTE — Progress Notes (Signed)
Patient with recurrent need for In and Out catheterization. Patient consistently not voiding and has been catheterized 2 times on two different shifts. Patient's bladder scan once again showing results greater than 400. Received order to place foley.   Noe Gens, RN

## 2018-06-19 NOTE — Progress Notes (Signed)
ANTICOAGULATION CONSULT NOTE   Pharmacy Consult for Heparin Indication: pulmonary embolus  Allergies  Allergen Reactions  . Avelox [Moxifloxacin Hcl In Nacl] Other (See Comments)    Mental breakdown  . Amoxicillin Hives  . Pamelor [Nortriptyline Hcl] Other (See Comments)    Made her want to hurt herself  . Penicillins Hives     Has patient had a PCN reaction causing immediate rash, facial/tongue/throat swelling, SOB or lightheadedness with hypotension: Yes Has patient had a PCN reaction causing severe rash involving mucus membranes or skin necrosis: Unk Has patient had a PCN reaction that required hospitalization: Unk Has patient had a PCN reaction occurring within the last 10 years: Unk If all of the above answers are "NO", then may proceed with Cephalosporin use.   . Other Itching    Any med that ends in -caine  . Hydrocodone Itching  . Oxycodone Itching  . Sulfa Antibiotics Other (See Comments)    "Allergic," per [facility]    Patient Measurements: Height: 5\' 4"  (162.6 cm) Weight: 154 lb 15.7 oz (70.3 kg) IBW/kg (Calculated) : 54.7 Heparin Dosing Weight: 68 kg  Vital Signs: Temp: 98.5 F (36.9 C) (10/11 1825) Temp Source: Oral (10/11 1825) BP: 116/68 (10/11 1825) Pulse Rate: 67 (10/11 1825)  Labs: Recent Labs    06/17/18 1515 06/18/18 0308 06/18/18 0726  06/18/18 2224 06/19/18 0336 06/19/18 0742 06/19/18 1607  HGB 10.2*  --  9.4*  --   --  9.1*  --   --   HCT 32.3*  --  31.9*  --   --  29.2*  --   --   PLT 253  --  242  --   --  258  --   --   HEPARINUNFRC  --  1.60*  --    < > 0.83*  --  0.41 <0.10*  CREATININE 1.42* 1.34*  --   --   --  1.00  --   --    < > = values in this interval not displayed.    Estimated Creatinine Clearance: 57.5 mL/min (by C-G formula based on SCr of 1 mg/dL).   Medical History: Past Medical History:  Diagnosis Date  . Acute right MCA stroke (HCC) 11/07/10  . Atrial fibrillation (HCC)    a. s/p TEE-DCCV 02/2104; b. Xarelto  started  . CAD (coronary artery disease)    a.  cath 09/2010: LAD stent patent, S-Int/dCFX ok, S-PDA ok, L-LAD atretic;  b. Lexiscan Myoview (02/2014):  no ischemia, EF 55%; c. 11/2014 Cath/PCI: LM nl, LAD 20pISR, LCX 80-37m, OM1 nl, RI 70p, RCA 40-4m, RPDA 95ost/95-72m (PTCA only w/ reduction to 50p/38m), 60d, L->LAD atretic, VG->RI->OM nl, VG->PDA 100p.  . Cardiomyopathy with EF 40% at TEE 02/17/14 (likely tachycardia mediated - Myoview 02/19/14 neg for ischemia with normal EF) 02/18/2014  . COPD (chronic obstructive pulmonary disease) (HCC)   . Depression   . Eczema   . GERD (gastroesophageal reflux disease)   . Headache   . HLD (hyperlipidemia)   . Homelessness 11/12/2011  . 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 Hx of CABG   . Hx of transesophageal echocardiography (TEE) for monitoring 11/2010   TEE 11/2010: EF 60-65%, BAE, trivial atrial septal shunt;  right heart cath in 10/2010 with elevated R and L heart pressures and diuretic started  . Hypertension   . Hypothyroidism   .  Persistent atrial fibrillation 10/2014  . Pulmonary nodules    repeat CT due in 11/2011  . Sleep apnea    recent sleep study  in 04/2014 per chart review  shows no significant OSA    Assessment: 62 yo F presents with SOB. CT angio shows acute PE with heart strain. No anticoag PTA. Currently on IV heparin at 450 units/hr. HL this evening is subtherapeutic at <0.10. Nurse says that infusion has not been interrupted.   Goal of Therapy:  Heparin level 0.3-0.7 units/ml Monitor platelets by anticoagulation protocol: Yes   Plan:  Increase heparin at 600 units/hr  0300 HL  Monitor daily heparin level, CBC, s/s of bleed   Jeanella Cara, PharmD, [facility] Clinical Pharmacist Please see AMION for all Pharmacists' Contact Phone Numbers 06/19/2018, 7:07 PM

## 2018-06-20 LAB — COMPREHENSIVE METABOLIC PANEL
ALK PHOS: 71 U/L (ref 38–126)
ALT: 9 U/L (ref 0–44)
AST: 22 U/L (ref 15–41)
Albumin: 2.7 g/dL — ABNORMAL LOW (ref 3.5–5.0)
Anion gap: 9 (ref 5–15)
BILIRUBIN TOTAL: 0.3 mg/dL (ref 0.3–1.2)
BUN: 19 mg/dL (ref 6–20)
CALCIUM: 8.3 mg/dL — AB (ref 8.9–10.3)
CO2: 22 mmol/L (ref 22–32)
CREATININE: 1.32 mg/dL — AB (ref 0.44–1.00)
Chloride: 108 mmol/L (ref 98–111)
GFR, EST AFRICAN AMERICAN: 50 mL/min — AB (ref >60)
GFR, EST NON AFRICAN AMERICAN: 43 mL/min — AB (ref >60)
Glucose, Bld: 101 mg/dL — ABNORMAL HIGH (ref 70–99)
Potassium: 4.1 mmol/L (ref 3.5–5.1)
Sodium: 139 mmol/L (ref 135–145)
Total Protein: 5.9 g/dL — ABNORMAL LOW (ref 6.5–8.1)

## 2018-06-20 LAB — HEPARIN LEVEL (UNFRACTIONATED)
HEPARIN UNFRACTIONATED: 0.17 [IU]/mL — AB (ref 0.30–0.70)
HEPARIN UNFRACTIONATED: 0.38 [IU]/mL (ref 0.30–0.70)

## 2018-06-20 LAB — URINE CULTURE: Culture: >=100000 — AB

## 2018-06-20 LAB — CBC
HCT: 30.3 % — ABNORMAL LOW (ref 36.0–46.0)
HEMOGLOBIN: 9.3 g/dL — AB (ref 12.0–15.0)
MCH: 30.2 pg (ref 26.0–34.0)
MCHC: 30.7 g/dL (ref 30.0–36.0)
MCV: 98.4 fL (ref 80.0–100.0)
Platelets: 393 10*3/uL (ref 150–400)
RBC: 3.08 MIL/uL — AB (ref 3.87–5.11)
RDW: 15.1 % (ref 11.5–15.5)
WBC: 10.6 10*3/uL — ABNORMAL HIGH (ref 4.0–10.5)
nRBC: 0 % (ref 0.0–0.2)

## 2018-06-20 MED ORDER — HALOPERIDOL LACTATE 5 MG/ML IJ SOLN
2.0000 mg | Freq: Once | INTRAMUSCULAR | Status: AC
  Administered 2018-06-20: 2 mg via INTRAVENOUS
  Filled 2018-06-20: qty 1

## 2018-06-20 MED ORDER — DILTIAZEM HCL 60 MG PO TABS
60.0000 mg | ORAL_TABLET | Freq: Four times a day (QID) | ORAL | Status: DC
  Administered 2018-06-20 – 2018-06-21 (×2): 60 mg via ORAL
  Filled 2018-06-20 (×2): qty 1

## 2018-06-20 NOTE — Plan of Care (Signed)
Patient is extremely confused and has delayed responses.  Discussed plan of care for the evening with patient.  Emphasized using the call bell when assistance is needed.  No teach back displayed.

## 2018-06-20 NOTE — Progress Notes (Signed)
[facility] TEAM 1 - Stepdown/ICU TEAM  [name]  WJX:[id number] DOB: 1956/07/18 DOA: 06/17/2018 PCP: Madelin Headings, MD    Brief Narrative:  62 year old female w/ a hx of tobacco abuse, dementia, sleep apnea, systolic CHF, HTN, Hypothyroidism, CVA, and COPD who resides in a SNF after a recent ICH / IVH resulting in L sided weakness who presented with hypoxia and and was found to have PE.    Significant Events: 10/9 admit 10/11 TRH assumed care  Subjective: HR now much better controlled on cardizem gtt. Resting comfortably in bed. No resp distress and no uncontrolled pain.   Assessment & Plan:  Acute R PE - increased RV/LV ratio - Hypoxic resp failure  off anticoagulation since ICH in July - continue heparin infusion for now - lytic tx was not indicated - likely driving her RVR  Atrial fibrillation with RVR Was previously prescribed diltiazem and metoprolol, but neither listed on SNF MAR - now well controlled on cardizem gtt and scheduled BB - attempt to transition off CCB gtt today   HTN Controlled   Klebsiella urinary tract infection Continue ceftriaxone to finish 5 days - proven sensitive on culture   AKI crt now trending back up - follow   Urinary retention required I/O cath in serial fashion - foley placed 10/11 - cont urecholine w/ plan to attempt to remove foley again soon   COPD without acute exacerbation Using Xopenex so as not to exacerbate RVR  CAD with history of CABG  Hyperlipidemia  History of right MCA stroke with residual L sided weakness  IVH July 2019 resolved on CT head this admission  Pressure injuries left heel and left elbow WOC suggestions being followed   DVT prophylaxis: IV heparin  Code Status: FULL CODE Family Communication: no family present at time of exam  Disposition Plan: SDU  Consultants:  PCCM  Antimicrobials:  Ceftriaxone 10/9 >  Objective: Blood pressure 90/71, pulse 84, temperature 97.6 F (36.4 C),  temperature source Oral, resp. rate 16, height 5\' 4"  (1.626 m), weight 69.7 kg, SpO2 94 %.  Intake/Output Summary (Last 24 hours) at 06/20/2018 1436 Last data filed at 06/20/2018 1200 Gross per 24 hour  Intake 442.99 ml  Output 375 ml  Net 67.99 ml   Filed Weights   06/18/18 0409 06/19/18 0500 06/20/18 0447  Weight: 68 kg 70.3 kg 69.7 kg    Examination: General: NAD Lungs: CTA B - no wheezing  Cardiovascular: irreg irreg - rate 80  Abdomen: NT/ND, soft, bs+, no mass  Extremities: 1+ R LE edema w/o change   CBC: Recent Labs  Lab 06/17/18 1515 06/18/18 0726 06/19/18 0336 06/20/18 0251  WBC 6.3 6.1 6.7 10.6*  NEUTROABS 4.6  --   --   --   HGB 10.2* 9.4* 9.1* 9.3*  HCT 32.3* 31.9* 29.2* 30.3*  MCV 99.1 100.6* 98.0 98.4  PLT 253 242 258 393   Basic Metabolic Panel: Recent Labs  Lab 06/18/18 0308 06/18/18 1525 06/19/18 0336 06/20/18 0251  NA 137  --  138 139  K 3.9  --  3.5 4.1  CL 106  --  108 108  CO2 22  --  23 22  GLUCOSE 145*  --  89 101*  BUN 18  --  12 19  CREATININE 1.34*  --  1.00 1.32*  CALCIUM 7.2*  --  7.5* 8.3*  MG 0.9* 3.1* 2.5*  --   PHOS 3.7  --   --   --  GFR: Estimated Creatinine Clearance: 43.4 mL/min (A) (by C-G formula based on SCr of 1.32 mg/dL (H)).  Liver Function Tests: Recent Labs  Lab 06/17/18 1515 06/20/18 0251  AST 19 22  ALT 9 9  ALKPHOS 85 71  BILITOT 0.6 0.3  PROT 5.8* 5.9*  ALBUMIN 2.5* 2.7*    HbA1C: Hgb A1c MFr Bld  Date/Time Value Ref Range Status  03/21/2018 06:50 AM 5.1 4.8 - 5.6 % Final    Comment:    (NOTE) Pre diabetes:          5.7%-6.4% Diabetes:              >6.4% Glycemic control for   <7.0% adults with diabetes   02/11/2011 06:00 AM  <5.7 % Final   5.4 (NOTE)                                                                       According to the ADA Clinical Practice Recommendations for 2011, when HbA1c is used as a screening test:   >=6.5%   Diagnostic of Diabetes Mellitus           (if  abnormal result  is confirmed)  5.7-6.4%   Increased risk of developing Diabetes Mellitus  References:Diagnosis and Classification of Diabetes Mellitus,Diabetes Care,2011,34(Suppl 1):S62-S69 and Standards of Medical Care in         Diabetes - 2011,Diabetes Care,2011,34  (Suppl 1):S11-S61.    Recent Results (from the past 240 hour(s))  Blood Culture (routine x 2)     Status: None (Preliminary result)   Collection Time: 06/17/18  3:13 PM  Result Value Ref Range Status   Specimen Description BLOOD SITE NOT SPECIFIED  Final   Special Requests   Final    BOTTLES DRAWN AEROBIC AND ANAEROBIC Blood Culture adequate volume   Culture   Final    NO GROWTH 3 DAYS Performed at [facility], [address], [place], [place] [address]    Report Status PENDING  Incomplete  Blood Culture (routine x 2)     Status: None (Preliminary result)   Collection Time: 06/17/18  3:45 PM  Result Value Ref Range Status   Specimen Description BLOOD LEFT ANTECUBITAL  Final   Special Requests   Final    BOTTLES DRAWN AEROBIC ONLY Blood Culture results may not be optimal due to an inadequate volume of blood received in culture bottles   Culture   Final    NO GROWTH 3 DAYS Performed at [facility], [address], [place], [place] [address]    Report Status PENDING  Incomplete  Culture, Urine     Status: Abnormal   Collection Time: 06/17/18  5:25 PM  Result Value Ref Range Status   Specimen Description URINE, RANDOM  Final   Special Requests   Final    NONE Performed at [facility], [address], [place], [place] [address]    Culture >=100,000 COLONIES/mL KLEBSIELLA PNEUMONIAE (A)  Final   Report Status 06/20/2018 FINAL  Final   Organism ID, Bacteria KLEBSIELLA PNEUMONIAE (A)  Final      Susceptibility   Klebsiella pneumoniae - MIC*    AMPICILLIN RESISTANT Resistant     CEFAZOLIN <=4 SENSITIVE  Sensitive     CEFTRIAXONE <=1 SENSITIVE Sensitive     CIPROFLOXACIN <=0.25 SENSITIVE Sensitive      GENTAMICIN <=1 SENSITIVE Sensitive     IMIPENEM <=0.25 SENSITIVE Sensitive     NITROFURANTOIN 32 SENSITIVE Sensitive     TRIMETH/SULFA <=20 SENSITIVE Sensitive     AMPICILLIN/SULBACTAM <=2 SENSITIVE Sensitive     PIP/TAZO <=4 SENSITIVE Sensitive     Extended ESBL NEGATIVE Sensitive     * >=100,000 COLONIES/mL KLEBSIELLA PNEUMONIAE  MRSA PCR Screening     Status: None   Collection Time: 06/17/18 10:30 PM  Result Value Ref Range Status   MRSA by PCR NEGATIVE NEGATIVE Final    Comment:        The GeneXpert MRSA Assay (FDA approved for NASAL specimens only), is one component of a comprehensive MRSA colonization surveillance program. It is not intended to diagnose MRSA infection nor to guide or monitor treatment for MRSA infections. Performed at [facility], [address], [place], [place] [address]      Scheduled Meds: . budesonide (PULMICORT) nebulizer solution  0.5 mg Nebulization BID  . famotidine  20 mg Oral BID  . feeding supplement (ENSURE ENLIVE)  237 mL Oral BID BM  . FLUoxetine  20 mg Oral Daily  . ipratropium  0.5 mg Nebulization TID  . levalbuterol  0.63 mg Nebulization TID  . metoprolol tartrate  12.5 mg Oral BID  . nutrition supplement (JUVEN)  1 packet Oral BID BM  . povidone-iodine   Topical BID   Continuous Infusions: . sodium chloride 10 mL/hr at 06/19/18 1200  . cefTRIAXone (ROCEPHIN)  IV 1 g (06/20/18 0111)  . diltiazem (CARDIZEM) infusion 5 mg/hr (06/19/18 1646)  . heparin 700 Units/hr (06/20/18 0450)  . sodium chloride Stopped (06/18/18 0825)     LOS: 3 days   Lonia Blood, MD [facility] Office  [phone] Pager - Text Page per Amion  If 7PM-7AM, please contact night-coverage per Amion 06/20/2018, 2:36 PM

## 2018-06-20 NOTE — Progress Notes (Signed)
ANTICOAGULATION CONSULT NOTE   Pharmacy Consult for Heparin Indication: pulmonary embolus  Allergies  Allergen Reactions  . Avelox [Moxifloxacin Hcl In Nacl] Other (See Comments)    Mental breakdown  . Amoxicillin Hives  . Pamelor [Nortriptyline Hcl] Other (See Comments)    Made her want to hurt herself  . Penicillins Hives     Has patient had a PCN reaction causing immediate rash, facial/tongue/throat swelling, SOB or lightheadedness with hypotension: Yes Has patient had a PCN reaction causing severe rash involving mucus membranes or skin necrosis: Unk Has patient had a PCN reaction that required hospitalization: Unk Has patient had a PCN reaction occurring within the last 10 years: Unk If all of the above answers are "NO", then may proceed with Cephalosporin use.   . Other Itching    Any med that ends in -caine  . Hydrocodone Itching  . Oxycodone Itching  . Sulfa Antibiotics Other (See Comments)    "Allergic," per [facility]    Patient Measurements: Height: 5\' 4"  (162.6 cm) Weight: 154 lb 15.7 oz (70.3 kg) IBW/kg (Calculated) : 54.7 Heparin Dosing Weight: 68 kg  Vital Signs: Temp: 98.5 F (36.9 C) (10/11 1825) Temp Source: Oral (10/11 1825) BP: 124/74 (10/12 0344) Pulse Rate: 93 (10/12 0344)  Labs: Recent Labs    06/18/18 0308 06/18/18 0726  06/19/18 0336 06/19/18 0742 06/19/18 1607 06/20/18 0251  HGB  --  9.4*  --  9.1*  --   --  9.3*  HCT  --  31.9*  --  29.2*  --   --  30.3*  PLT  --  242  --  258  --   --  393  HEPARINUNFRC 1.60*  --    < >  --  0.41 <0.10* 0.17*  CREATININE 1.34*  --   --  1.00  --   --  1.32*   < > = values in this interval not displayed.    Estimated Creatinine Clearance: 43.6 mL/min (A) (by C-G formula based on SCr of 1.32 mg/dL (H)).   Medical History: Past Medical History:  Diagnosis Date  . Acute right MCA stroke (HCC) 11/07/10  . Atrial fibrillation (HCC)    a. s/p TEE-DCCV 02/2104; b. Xarelto started  . CAD (coronary artery  disease)    a.  cath 09/2010: LAD stent patent, S-Int/dCFX ok, S-PDA ok, L-LAD atretic;  b. Lexiscan Myoview (02/2014):  no ischemia, EF 55%; c. 11/2014 Cath/PCI: LM nl, LAD 20pISR, LCX 80-39m, OM1 nl, RI 70p, RCA 40-83m, RPDA 95ost/95-26m (PTCA only w/ reduction to 50p/63m), 60d, L->LAD atretic, VG->RI->OM nl, VG->PDA 100p.  . Cardiomyopathy with EF 40% at TEE 02/17/14 (likely tachycardia mediated - Myoview 02/19/14 neg for ischemia with normal EF) 02/18/2014  . COPD (chronic obstructive pulmonary disease) (HCC)   . Depression   . Eczema   . GERD (gastroesophageal reflux disease)   . Headache   . HLD (hyperlipidemia)   . Homelessness 11/12/2011  . 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 Hx of CABG   . Hx of transesophageal echocardiography (TEE) for monitoring 11/2010   TEE 11/2010: EF 60-65%, BAE, trivial atrial septal shunt;  right heart cath in 10/2010 with elevated R and L heart pressures and diuretic started  . Hypertension   . Hypothyroidism   . Persistent atrial fibrillation 10/2014  . Pulmonary nodules    repeat  CT due in 11/2011  . Sleep apnea    recent sleep study  in 04/2014 per chart review  shows no significant OSA    Assessment: 62 yo F presents with SOB. CT angio shows acute PE with heart strain. No anticoag PTA. Hgb 10.2, plts wnl. Pharmacy consulted to start heparin.  10/12 AM update: heparin level is low this AM but trending up, no issues per RN, Hgb stable  Goal of Therapy:  Heparin level 0.3-0.7 units/ml Monitor platelets by anticoagulation protocol: Yes   Plan:  Inc heparin to 700 units/hr 1300 HL Monitor daily heparin level, CBC, s/s of bleed   Abran Duke 06/20/2018,4:44 AM

## 2018-06-20 NOTE — Progress Notes (Signed)
RT called to pt's room, RN stated pt was hypoxic.  After assessing the pt.  Pt's sp02 is 99% on 2L [place] no distress noted.  Will continue monitor.

## 2018-06-20 NOTE — Progress Notes (Signed)
ANTICOAGULATION CONSULT NOTE - Follow Up Consult  Pharmacy Consult for Heparin Indication: pulmonary embolus  Allergies  Allergen Reactions  . Avelox [Moxifloxacin Hcl In Nacl] Other (See Comments)    Mental breakdown  . Amoxicillin Hives  . Pamelor [Nortriptyline Hcl] Other (See Comments)    Made her want to hurt herself  . Penicillins Hives     Has patient had a PCN reaction causing immediate rash, facial/tongue/throat swelling, SOB or lightheadedness with hypotension: Yes Has patient had a PCN reaction causing severe rash involving mucus membranes or skin necrosis: Unk Has patient had a PCN reaction that required hospitalization: Unk Has patient had a PCN reaction occurring within the last 10 years: Unk If all of the above answers are "NO", then may proceed with Cephalosporin use.   . Other Itching    Any med that ends in -caine  . Hydrocodone Itching  . Oxycodone Itching  . Sulfa Antibiotics Other (See Comments)    "Allergic," per [facility]    Patient Measurements: Height: 5\' 4"  (162.6 cm) Weight: 153 lb 11.2 oz (69.7 kg) IBW/kg (Calculated) : 54.7 Heparin Dosing Weight: 68 kg  Vital Signs: Temp: 97.6 F (36.4 C) (10/12 1217) Temp Source: Oral (10/12 1217) BP: 90/71 (10/12 1217) Pulse Rate: 84 (10/12 1217)  Labs: Recent Labs    06/18/18 0308 06/18/18 0726  06/19/18 0336  06/19/18 1607 06/20/18 0251 06/20/18 1308  HGB  --  9.4*  --  9.1*  --   --  9.3*  --   HCT  --  31.9*  --  29.2*  --   --  30.3*  --   PLT  --  242  --  258  --   --  393  --   HEPARINUNFRC 1.60*  --    < >  --    < > <0.10* 0.17* 0.38  CREATININE 1.34*  --   --  1.00  --   --  1.32*  --    < > = values in this interval not displayed.    Estimated Creatinine Clearance: 43.4 mL/min (A) (by C-G formula based on SCr of 1.32 mg/dL (H)).   Medications:  Scheduled:  . budesonide (PULMICORT) nebulizer solution  0.5 mg Nebulization BID  . famotidine  20 mg Oral BID  . feeding supplement (ENSURE  ENLIVE)  237 mL Oral BID BM  . FLUoxetine  20 mg Oral Daily  . ipratropium  0.5 mg Nebulization TID  . levalbuterol  0.63 mg Nebulization TID  . metoprolol tartrate  12.5 mg Oral BID  . nutrition supplement (JUVEN)  1 packet Oral BID BM  . povidone-iodine   Topical BID    Assessment: 62 yo F presents with SOB. CT angio shows acute PE with heart strain. No anticoag PTA.   Patient's heparin rate increased this morning, afternoon heparin level this afternoon therapeutic at 0.38  Hgb low but stable, platelets wnl.    Goal of Therapy:  Heparin level 0.3-0.7 units/ml Monitor platelets by anticoagulation protocol: Yes   Plan:  Continue heparin 700 units / hour Daily heparin level, CBC, s/sx of bleeding    Thank you for allowing pharmacy to be a part of this patient's care. Bradley Ferris, PharmD 06/20/2018 1:52 PM PGY-1 Pharmacy Resident Phone: [phone] Please check [email or web address] for unit-specific pharmacist phone numbers after 3:00 P.M.

## 2018-06-21 ENCOUNTER — Inpatient Hospital Stay (HOSPITAL_COMMUNITY): Payer: PPO

## 2018-06-21 LAB — BLOOD GAS, ARTERIAL
Acid-Base Excess: 0.7 mmol/L (ref 0.0–2.0)
BICARBONATE: 24.2 mmol/L (ref 20.0–28.0)
Drawn by: 365271
O2 Content: 2 L/min
O2 SAT: 93.8 %
PCO2 ART: 32.1 mmHg (ref 32.0–48.0)
PH ART: 7.483 — AB (ref 7.350–7.450)
Patient temperature: 96.1
pO2, Arterial: 61.1 mmHg — ABNORMAL LOW (ref 83.0–108.0)

## 2018-06-21 LAB — BASIC METABOLIC PANEL
Anion gap: 8 (ref 5–15)
BUN: 15 mg/dL (ref 6–20)
CHLORIDE: 108 mmol/L (ref 98–111)
CO2: 23 mmol/L (ref 22–32)
CREATININE: 1.12 mg/dL — AB (ref 0.44–1.00)
Calcium: 8.2 mg/dL — ABNORMAL LOW (ref 8.9–10.3)
GFR calc Af Amer: >60 mL/min (ref >60)
GFR calc non Af Amer: 52 mL/min — ABNORMAL LOW (ref >60)
GLUCOSE: 79 mg/dL (ref 70–99)
POTASSIUM: 3.7 mmol/L (ref 3.5–5.1)
SODIUM: 139 mmol/L (ref 135–145)

## 2018-06-21 LAB — CBC
HCT: 28.7 % — ABNORMAL LOW (ref 36.0–46.0)
HEMOGLOBIN: 8.7 g/dL — AB (ref 12.0–15.0)
MCH: 30.4 pg (ref 26.0–34.0)
MCHC: 30.3 g/dL (ref 30.0–36.0)
MCV: 100.3 fL — AB (ref 80.0–100.0)
Platelets: 238 10*3/uL (ref 150–400)
RBC: 2.86 MIL/uL — AB (ref 3.87–5.11)
RDW: 15.1 % (ref 11.5–15.5)
WBC: 6.8 10*3/uL (ref 4.0–10.5)
nRBC: 0 % (ref 0.0–0.2)

## 2018-06-21 LAB — HEPARIN LEVEL (UNFRACTIONATED): Heparin Unfractionated: 0.34 IU/mL (ref 0.30–0.70)

## 2018-06-21 MED ORDER — MORPHINE SULFATE (PF) 2 MG/ML IV SOLN
2.0000 mg | Freq: Once | INTRAVENOUS | Status: AC
  Administered 2018-06-21: 2 mg via INTRAVENOUS
  Filled 2018-06-21: qty 1

## 2018-06-21 MED ORDER — DEXTROSE-NACL 5-0.9 % IV SOLN
INTRAVENOUS | Status: DC
  Administered 2018-06-21 – 2018-06-22 (×2): via INTRAVENOUS

## 2018-06-21 MED ORDER — METOPROLOL TARTRATE 5 MG/5ML IV SOLN
5.0000 mg | Freq: Four times a day (QID) | INTRAVENOUS | Status: DC | PRN
  Administered 2018-06-22: 5 mg via INTRAVENOUS
  Filled 2018-06-21: qty 5

## 2018-06-21 MED ORDER — IPRATROPIUM BROMIDE 0.02 % IN SOLN
0.5000 mg | Freq: Two times a day (BID) | RESPIRATORY_TRACT | Status: DC
  Administered 2018-06-21 – 2018-06-22 (×2): 0.5 mg via RESPIRATORY_TRACT
  Filled 2018-06-21 (×2): qty 2.5

## 2018-06-21 MED ORDER — ALBUTEROL SULFATE (2.5 MG/3ML) 0.083% IN NEBU
INHALATION_SOLUTION | RESPIRATORY_TRACT | Status: AC
  Filled 2018-06-21: qty 3

## 2018-06-21 MED ORDER — LEVALBUTEROL HCL 0.63 MG/3ML IN NEBU
INHALATION_SOLUTION | RESPIRATORY_TRACT | Status: AC
  Administered 2018-06-21: 0.63 mg
  Filled 2018-06-21: qty 3

## 2018-06-21 MED ORDER — LEVALBUTEROL HCL 0.63 MG/3ML IN NEBU
0.6300 mg | INHALATION_SOLUTION | Freq: Two times a day (BID) | RESPIRATORY_TRACT | Status: DC
  Administered 2018-06-21 – 2018-06-22 (×2): 0.63 mg via RESPIRATORY_TRACT
  Filled 2018-06-21 (×2): qty 3

## 2018-06-21 MED ORDER — LORAZEPAM 2 MG/ML IJ SOLN
0.5000 mg | INTRAMUSCULAR | Status: DC | PRN
  Administered 2018-06-21 – 2018-06-22 (×3): 0.5 mg via INTRAVENOUS
  Filled 2018-06-21 (×4): qty 1

## 2018-06-21 MED ORDER — LEVALBUTEROL HCL 0.63 MG/3ML IN NEBU
0.6300 mg | INHALATION_SOLUTION | Freq: Four times a day (QID) | RESPIRATORY_TRACT | Status: DC | PRN
  Administered 2018-06-22: 0.63 mg via RESPIRATORY_TRACT
  Filled 2018-06-21: qty 3

## 2018-06-21 NOTE — Progress Notes (Signed)
Patient experiencing somnolence with brief periods of wakefulness that result in severe agitation, increase in heart rate and blood pressure. See flowsheet. During these periods, she appears to be in pain, but does not verbalize any needs and does not follow commands. Nightshift nursing staff noted she had marked tachypnea and dyspnea with diaphragmatic retractions and they reported bowel sounds audible in chest cavity. These findings were reported to Dr. Sharon Seller this morning via telephone conservations.

## 2018-06-21 NOTE — Progress Notes (Signed)
Pt became agitated and combative when awake resulting in increased HR (140s), also refusing PO medications. PRN ativan given, HR decreases below 100bpm.  Provider notified via page.

## 2018-06-21 NOTE — Progress Notes (Signed)
ANTICOAGULATION CONSULT NOTE - Follow Up Consult  Pharmacy Consult for Heparin Indication: pulmonary embolus  Allergies  Allergen Reactions  . Avelox [Moxifloxacin Hcl In Nacl] Other (See Comments)    Mental breakdown  . Amoxicillin Hives  . Pamelor [Nortriptyline Hcl] Other (See Comments)    Made her want to hurt herself  . Penicillins Hives     Has patient had a PCN reaction causing immediate rash, facial/tongue/throat swelling, SOB or lightheadedness with hypotension: Yes Has patient had a PCN reaction causing severe rash involving mucus membranes or skin necrosis: Unk Has patient had a PCN reaction that required hospitalization: Unk Has patient had a PCN reaction occurring within the last 10 years: Unk If all of the above answers are "NO", then may proceed with Cephalosporin use.   . Other Itching    Any med that ends in -caine  . Hydrocodone Itching  . Oxycodone Itching  . Sulfa Antibiotics Other (See Comments)    "Allergic," per [facility]    Patient Measurements: Height: 5\' 4"  (162.6 cm) Weight: 151 lb 14.4 oz (68.9 kg) IBW/kg (Calculated) : 54.7 Heparin Dosing Weight: 68 kg  Vital Signs: Temp: 96.1 F (35.6 C) (10/13 0806) Temp Source: Axillary (10/13 0806) BP: 89/58 (10/13 0806) Pulse Rate: 80 (10/13 0806)  Labs: Recent Labs    06/19/18 0336  06/20/18 0251 06/20/18 1308 06/21/18 0635  HGB 9.1*  --  9.3*  --  8.7*  HCT 29.2*  --  30.3*  --  28.7*  PLT 258  --  393  --  238  HEPARINUNFRC  --    < > 0.17* 0.38 0.34  CREATININE 1.00  --  1.32*  --  1.12*   < > = values in this interval not displayed.    Estimated Creatinine Clearance: 50.9 mL/min (A) (by C-G formula based on SCr of 1.12 mg/dL (H)).   Medications:  Scheduled:  . budesonide (PULMICORT) nebulizer solution  0.5 mg Nebulization BID  . diltiazem  60 mg Oral Q6H  . famotidine  20 mg Oral BID  . feeding supplement (ENSURE ENLIVE)  237 mL Oral BID BM  . FLUoxetine  20 mg Oral Daily  .  ipratropium  0.5 mg Nebulization BID  . levalbuterol  0.63 mg Nebulization BID  . metoprolol tartrate  12.5 mg Oral BID  . nutrition supplement (JUVEN)  1 packet Oral BID BM  . povidone-iodine   Topical BID    Assessment: 62 yo F presents with SOB. CT angio shows acute PE with heart strain. No anticoag PTA.  Patient's heparin rate increased yesterday morning and therapeutic HL in afternoon.   Patient's HL continues to be therapeutic this morning. CBC low but stable. Continuing current infusion regimen. No bleeding noted.    Goal of Therapy:  Heparin level 0.3-0.7 units/ml Monitor platelets by anticoagulation protocol: Yes   Plan:  Continue heparin 700 units / hour Daily heparin level, CBC, s/sx of bleeding  Thank you for allowing pharmacy to be a part of this patient's care.  Bradley Ferris, PharmD 06/21/2018 8:24 AM PGY-1 Pharmacy Resident Phone: [phone] Please check [email or web address] for unit-specific pharmacist phone numbers after 3:00 P.M.

## 2018-06-21 NOTE — Significant Event (Addendum)
Rapid Response Event Note  Overview:  Called by bedside RN stating pt with resp distress, tachycardic, tachypnic    Initial Focused Assessment: On arrival pt agitated in bed, not following commands (appears this has been on ongoing issue since admission), RR 24, spO2 94%, HR 120. Lung sounds rhonchi/rales. RT at bedside administering neb treatment.   After reading thru chart, it appears pt has had issues with agitation and delirium along with afib-rvr. Cardizem converted from IV to PO 10/13 although it appears she did not receive multiple PO doses d/t pt mental status. Abg done this am-unremarkable, Head CT done today was negative as well.   Interventions: Chest Xray ordered, TRH paged, Abg ordered, Blount at bedside to assess. Metoprolol IV PRN ordered for HR control. Thorough oral care performed.   Plan of Care (if not transferred): Will continue to monitor. If pt continues to desat, will attempt Bipap although I do not know if she will cooperate and keep mask on. Continue to watch HR to keep <120, keep spO2 >90%. Spoke with bedside RN about plan and instructed to call with any changes/questions/concerns.   2100 pt currently resting in bed with no distress noted VSS. Will continue to monitor and remain available to staff.  Event Summary:  called at  1942   event ended at  2105  0045: Follow up- pt resting in bed with no signs of distress noted. HR 109, BP 149/117, RR 19, spO2 97% on 4L [place].   0240: called by bedside RN d/t pt with increased agitation without any recent stimulation. On arrival pt with HR 150's (got as high as 177), RR 40's, spO2 100% on 4L [place]. Lungs sound rhonchorous throughout with audible wheezing from across the room. PRN Metoprolol IV given, RT called and neb given. TRH paged and concern for respiratory status explained, CCM consulted. Pt transferred to 2M12 at 0430.     Mordecai Rasmussen

## 2018-06-21 NOTE — Progress Notes (Signed)
Pt found to be in resp distress with increased WOB, increased HR to 130's, and increased RR to 35. Pt SPO2 78. Switched pt to mask 4L with no improvement. Paged respiratory, nebulizer tx given. Resp and Rapid at bedside. BBS with Exp wheezing and ronchi. Paged Bruna Demarais, NP. Orders given for ABGs and stated will come up after results of ABG.

## 2018-06-21 NOTE — Progress Notes (Signed)
[place] TEAM 1 - Stepdown/ICU TEAM  ZMYA LYNG  RKY:[id number] DOB: [birth year] DOA: 06/17/2018 PCP: Madelin Headings, MD    Brief Narrative:  62 year old female w/ a hx of tobacco abuse, dementia, sleep apnea, systolic CHF, HTN, Hypothyroidism, CVA, and COPD who resides in a SNF after a recent ICH / IVH resulting in L sided weakness who presented with hypoxia and and was found to have PE.    Significant Events: 10/9 admit 10/11 [facility] assumed care  Subjective: The patient's mental status has been declining over the last 24hrs, w/ the majority of the day spent asleep, with intermittent episodes of severe agitation and delirium. She is not able to provide a hx/ROS. She is presently very somnolent and will not awaken to my voice or exam.   Assessment & Plan:  Acute R PE - increased RV/LV ratio - Hypoxic resp failure  off anticoagulation since ICH in July - continue heparin infusion for now - lytic tx was not indicated  Obtundation / Delirium MS has been on a decline for ~36hrs now, but much worse over last 24hrs - check CT head to r/o recurrent ICH - check ABG   Atrial fibrillation with RVR Was previously prescribed diltiazem and metoprolol, but neither listed on SNF MAR - HR controlled presently, but having episodes of RVR related to agitation - off cardizem gtt for now but may require return    HTN Controlled   Klebsiella urinary tract infection Continue ceftriaxone to finish 5 days - proven sensitive on culture   AKI crt waxing and waning - following trend   Urinary retention required I/O cath in serial fashion - foley placed 10/11 - cont urecholine w/ plan to attempt to remove foley again when MS improves   COPD without acute exacerbation Using Xopenex so as not to exacerbate RVR  CAD with history of CABG  Hyperlipidemia  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 rate 16, height 5\' 4"  (1.626 m), weight 68.9 kg, SpO2 97 %.  Intake/Output Summary (Last 24 hours) at 06/21/2018 0904 Last data filed at 06/21/2018 0700 Gross per 24 hour  Intake 606.03 ml  Output 675 ml  Net -68.97 ml   Filed Weights   06/19/18 0500 06/20/18 0447 06/21/18 0415  Weight: 70.3 kg 69.7 kg 68.9 kg    Examination: General: NAD - obtunded/somnolent  Lungs: CTA B w/o wheezing or focal crackles  Cardiovascular: irreg irreg - rate controlled - no M  Abdomen: NT/ND, soft, bs+, no mass  Extremities: trace B LE edema - no cyanosis   CBC: Recent Labs  Lab 06/17/18 1515  06/19/18 0336 06/20/18 0251 06/21/18 0635  WBC 6.3   < > 6.7 10.6* 6.8  NEUTROABS 4.6  --   --   --   --   HGB 10.2*   < > 9.1* 9.3* 8.7*  HCT 32.3*   < > 29.2* 30.3* 28.7*  MCV 99.1   < > 98.0 98.4 100.3*  PLT 253   < >  258 393 238   < > = values in this interval not displayed.   Basic Metabolic Panel: Recent Labs  Lab 06/18/18 0308 06/18/18 1525 06/19/18 0336 06/20/18 0251 06/21/18 0635  NA 137  --  138 139 139  K 3.9  --  3.5 4.1 3.7  CL 106  --  108 108 108  CO2 22  --  23 22 23   GLUCOSE 145*  --  89 101* 79  BUN 18  --  12 19 15   CREATININE 1.34*  --  1.00 1.32* 1.12*  CALCIUM 7.2*  --  7.5* 8.3* 8.2*  MG 0.9* 3.1* 2.5*  --   --   PHOS 3.7  --   --   --   --    GFR: Estimated Creatinine Clearance: 50.9 mL/min (A) (by C-G formula based on SCr of 1.12 mg/dL (H)).  Liver Function Tests: Recent Labs  Lab 06/17/18 1515 06/20/18 0251  AST 19 22  ALT 9 9  ALKPHOS 85 71  BILITOT 0.6 0.3   PROT 5.8* 5.9*  ALBUMIN 2.5* 2.7*    HbA1C: Hgb A1c MFr Bld  Date/Time Value Ref Range Status  03/21/2018 06:50 AM 5.1 4.8 - 5.6 % Final    Comment:    (NOTE) Pre diabetes:          5.7%-6.4% Diabetes:              >6.4% Glycemic control for   <7.0% adults with diabetes   02/11/2011 06:00 AM  <5.7 % Final   5.4 (NOTE)                                                                       According to the ADA Clinical Practice Recommendations for 2011, when HbA1c is used as a screening test:   >=6.5%   Diagnostic of Diabetes Mellitus           (if abnormal result  is confirmed)  5.7-6.4%   Increased risk of developing Diabetes Mellitus  References:Diagnosis and Classification of Diabetes Mellitus,Diabetes Care,2011,34(Suppl 1):S62-S69 and Standards of Medical Care in         Diabetes - 2011,Diabetes Care,2011,34  (Suppl 1):S11-S61.    Recent Results (from the past 240 hour(s))  Blood Culture (routine x 2)     Status: None (Preliminary result)   Collection Time: 06/17/18  3:13 PM  Result Value Ref Range Status   Specimen Description BLOOD SITE NOT SPECIFIED  Final   Special Requests   Final    BOTTLES DRAWN AEROBIC AND ANAEROBIC Blood Culture adequate volume   Culture   Final    NO GROWTH 4 DAYS Performed at [facility], [address], [place], [place] [address]    Report Status PENDING  Incomplete  Blood Culture (routine x 2)     Status: None (Preliminary result)   Collection Time: 06/17/18  3:45 PM  Result Value Ref Range Status   Specimen Description BLOOD LEFT ANTECUBITAL  Final   Special Requests   Final    BOTTLES DRAWN AEROBIC ONLY Blood Culture results may not be optimal due to an inadequate volume of blood received in culture bottles   Culture   Final    NO  GROWTH 4 DAYS Performed at [facility], [address], [place], [place] [address]    Report Status PENDING  Incomplete  Culture, Urine     Status: Abnormal   Collection Time: 06/17/18  5:25 PM   Result Value Ref Range Status   Specimen Description URINE, RANDOM  Final   Special Requests   Final    NONE Performed at [facility], [address], [place], [place] [address]    Culture >=100,000 COLONIES/mL KLEBSIELLA PNEUMONIAE (A)  Final   Report Status 06/20/2018 FINAL  Final   Organism ID, Bacteria KLEBSIELLA PNEUMONIAE (A)  Final      Susceptibility   Klebsiella pneumoniae - MIC*    AMPICILLIN RESISTANT Resistant     CEFAZOLIN <=4 SENSITIVE Sensitive     CEFTRIAXONE <=1 SENSITIVE Sensitive     CIPROFLOXACIN <=0.25 SENSITIVE Sensitive     GENTAMICIN <=1 SENSITIVE Sensitive     IMIPENEM <=0.25 SENSITIVE Sensitive     NITROFURANTOIN 32 SENSITIVE Sensitive     TRIMETH/SULFA <=20 SENSITIVE Sensitive     AMPICILLIN/SULBACTAM <=2 SENSITIVE Sensitive     PIP/TAZO <=4 SENSITIVE Sensitive     Extended ESBL NEGATIVE Sensitive     * >=100,000 COLONIES/mL KLEBSIELLA PNEUMONIAE  MRSA PCR Screening     Status: None   Collection Time: 06/17/18 10:30 PM  Result Value Ref Range Status   MRSA by PCR NEGATIVE NEGATIVE Final    Comment:        The GeneXpert MRSA Assay (FDA approved for NASAL specimens only), is one component of a comprehensive MRSA colonization surveillance program. It is not intended to diagnose MRSA infection nor to guide or monitor treatment for MRSA infections. Performed at [facility], [address] [place], [place] [address]      Scheduled Meds: . budesonide (PULMICORT) nebulizer solution  0.5 mg Nebulization BID  . diltiazem  60 mg Oral Q6H  . famotidine  20 mg Oral BID  . feeding supplement (ENSURE ENLIVE)  237 mL Oral BID BM  . FLUoxetine  20 mg Oral Daily  . ipratropium  0.5 mg Nebulization BID  . levalbuterol  0.63 mg Nebulization BID  . metoprolol tartrate  12.5 mg Oral BID  . nutrition supplement (JUVEN)  1 packet Oral BID BM  . povidone-iodine   Topical BID   Continuous Infusions: . sodium chloride 10 mL/hr at 06/19/18 1200   . cefTRIAXone (ROCEPHIN)  IV 1 g (06/21/18 0104)  . heparin 700 Units/hr (06/20/18 1926)  . sodium chloride Stopped (06/18/18 0825)     LOS: 4 days   Lonia Blood, MD [facility] Office  [phone] Pager - Text Page per Amion  If 7PM-7AM, please contact night-coverage per Amion 06/21/2018, 9:04 AM

## 2018-06-21 NOTE — Progress Notes (Signed)
Order for cardizem gtt expired and PO medication in place.  Pt BP 90/70s with HR 80s.  Cardizem gtt stopped and provider paged to confirm discontinuation of gtt.

## 2018-06-21 NOTE — Progress Notes (Signed)
  Called by RN to assess patient. Patient agitated, has increased WOB, increased HR and increased RR. Patient currently on Venturi mask 31% 4lpm.  Scheduled nebulizer tx given. BBS noted for Exp Wheezing and Rhonhi. NPC. Placed on 4 lpm [place]. Rapid at bedside.

## 2018-06-21 NOTE — Progress Notes (Signed)
Called by RN to check on patient who just got back from CT. Patient is agitated, has increased WOB and RR. BBS are expiratory wheezing. HR 150-170s. Xopenex treatment was given because of HR and progress note to not exacerbate RVR. RN made aware.

## 2018-06-22 ENCOUNTER — Inpatient Hospital Stay (HOSPITAL_COMMUNITY): Payer: PPO

## 2018-06-22 DIAGNOSIS — I2693 Single subsegmental pulmonary embolism without acute cor pulmonale: Secondary | ICD-10-CM

## 2018-06-22 DIAGNOSIS — J9601 Acute respiratory failure with hypoxia: Secondary | ICD-10-CM

## 2018-06-22 DIAGNOSIS — I2699 Other pulmonary embolism without acute cor pulmonale: Secondary | ICD-10-CM

## 2018-06-22 LAB — POCT I-STAT 3, ART BLOOD GAS (G3+)
Acid-base deficit: 4 mmol/L — ABNORMAL HIGH (ref 0.0–2.0)
Acid-base deficit: 6 mmol/L — ABNORMAL HIGH (ref 0.0–2.0)
BICARBONATE: 20.7 mmol/L (ref 20.0–28.0)
Bicarbonate: 19.5 mmol/L — ABNORMAL LOW (ref 20.0–28.0)
O2 SAT: 100 %
O2 Saturation: 99 %
PCO2 ART: 28.8 mmHg — AB (ref 32.0–48.0)
PH ART: 7.437 (ref 7.350–7.450)
PO2 ART: 110 mmHg — AB (ref 83.0–108.0)
PO2 ART: 345 mmHg — AB (ref 83.0–108.0)
Patient temperature: 98.3
Patient temperature: 97.9
TCO2: 20 mmol/L — ABNORMAL LOW (ref 22–32)
TCO2: 22 mmol/L (ref 22–32)
pCO2 arterial: 45.3 mmHg (ref 32.0–48.0)
pH, Arterial: 7.265 — ABNORMAL LOW (ref 7.350–7.450)

## 2018-06-22 LAB — CBC
HCT: 25.7 % — ABNORMAL LOW (ref 36.0–46.0)
Hemoglobin: 8 g/dL — ABNORMAL LOW (ref 12.0–15.0)
MCH: 30.7 pg (ref 26.0–34.0)
MCHC: 31.1 g/dL (ref 30.0–36.0)
MCV: 98.5 fL (ref 80.0–100.0)
NRBC: 0 % (ref 0.0–0.2)
PLATELETS: 226 10*3/uL (ref 150–400)
RBC: 2.61 MIL/uL — AB (ref 3.87–5.11)
RDW: 15 % (ref 11.5–15.5)
WBC: 5.5 10*3/uL (ref 4.0–10.5)

## 2018-06-22 LAB — COMPREHENSIVE METABOLIC PANEL
ALT: 8 U/L (ref 0–44)
AST: 20 U/L (ref 15–41)
Albumin: 2.5 g/dL — ABNORMAL LOW (ref 3.5–5.0)
Alkaline Phosphatase: 56 U/L (ref 38–126)
Anion gap: 8 (ref 5–15)
BUN: 13 mg/dL (ref 6–20)
CHLORIDE: 112 mmol/L — AB (ref 98–111)
CO2: 21 mmol/L — ABNORMAL LOW (ref 22–32)
Calcium: 7.8 mg/dL — ABNORMAL LOW (ref 8.9–10.3)
Creatinine, Ser: 1.18 mg/dL — ABNORMAL HIGH (ref 0.44–1.00)
GFR calc Af Amer: 57 mL/min — ABNORMAL LOW (ref >60)
GFR calc non Af Amer: 49 mL/min — ABNORMAL LOW (ref >60)
GLUCOSE: 99 mg/dL (ref 70–99)
POTASSIUM: 3.1 mmol/L — AB (ref 3.5–5.1)
Sodium: 141 mmol/L (ref 135–145)
Total Bilirubin: 0.5 mg/dL (ref 0.3–1.2)
Total Protein: 5.2 g/dL — ABNORMAL LOW (ref 6.5–8.1)

## 2018-06-22 LAB — HEPARIN LEVEL (UNFRACTIONATED)
HEPARIN UNFRACTIONATED: 0.2 [IU]/mL — AB (ref 0.30–0.70)
HEPARIN UNFRACTIONATED: 0.34 [IU]/mL (ref 0.30–0.70)
Heparin Unfractionated: 0.48 IU/mL (ref 0.30–0.70)

## 2018-06-22 LAB — GLUCOSE, CAPILLARY: Glucose-Capillary: 88 mg/dL (ref 70–99)

## 2018-06-22 LAB — LACTIC ACID, PLASMA
LACTIC ACID, VENOUS: 3.1 mmol/L — AB (ref 0.5–1.9)
Lactic Acid, Venous: 4.2 mmol/L (ref 0.5–1.9)

## 2018-06-22 LAB — MAGNESIUM: MAGNESIUM: 1.3 mg/dL — AB (ref 1.7–2.4)

## 2018-06-22 LAB — AMMONIA: AMMONIA: 30 umol/L (ref 9–35)

## 2018-06-22 MED ORDER — POTASSIUM CHLORIDE 10 MEQ/100ML IV SOLN
10.0000 meq | INTRAVENOUS | Status: AC
  Administered 2018-06-22 (×2): 10 meq via INTRAVENOUS
  Filled 2018-06-22 (×2): qty 100

## 2018-06-22 MED ORDER — SODIUM CHLORIDE 0.9 % IV SOLN
INTRAVENOUS | Status: DC
  Administered 2018-06-22 – 2018-06-30 (×5): via INTRAVENOUS

## 2018-06-22 MED ORDER — MIDAZOLAM HCL 2 MG/2ML IJ SOLN
2.0000 mg | Freq: Once | INTRAMUSCULAR | Status: AC
  Administered 2018-06-22: 2 mg via INTRAVENOUS

## 2018-06-22 MED ORDER — FENTANYL CITRATE (PF) 100 MCG/2ML IJ SOLN
100.0000 ug | INTRAMUSCULAR | Status: DC | PRN
  Administered 2018-06-24 – 2018-06-25 (×2): 100 ug via INTRAVENOUS
  Filled 2018-06-22 (×5): qty 2

## 2018-06-22 MED ORDER — HYDRALAZINE HCL 20 MG/ML IJ SOLN: 10.0000 mg | INTRAMUSCULAR | Status: DC | PRN

## 2018-06-22 MED ORDER — DEXTROSE IN LACTATED RINGERS 5 % IV SOLN
INTRAVENOUS | Status: DC
  Administered 2018-06-22 – 2018-06-23 (×3): via INTRAVENOUS

## 2018-06-22 MED ORDER — SODIUM CHLORIDE 0.9 % IV SOLN
1.0000 g | Freq: Two times a day (BID) | INTRAVENOUS | Status: DC
  Administered 2018-06-22 – 2018-06-24 (×5): 1 g via INTRAVENOUS
  Filled 2018-06-22 (×5): qty 1

## 2018-06-22 MED ORDER — SODIUM CHLORIDE 0.9 % IV BOLUS
500.0000 mL | Freq: Once | INTRAVENOUS | Status: AC
  Administered 2018-06-22: 500 mL via INTRAVENOUS

## 2018-06-22 MED ORDER — ORAL CARE MOUTH RINSE
15.0000 mL | Freq: Two times a day (BID) | OROMUCOSAL | Status: DC
  Administered 2018-06-22: 15 mL via OROMUCOSAL

## 2018-06-22 MED ORDER — ETOMIDATE 2 MG/ML IV SOLN
20.0000 mg | Freq: Once | INTRAVENOUS | Status: AC
  Administered 2018-06-22: 20 mg via INTRAVENOUS

## 2018-06-22 MED ORDER — ORAL CARE MOUTH RINSE
15.0000 mL | OROMUCOSAL | Status: DC
  Administered 2018-06-22 – 2018-06-28 (×63): 15 mL via OROMUCOSAL

## 2018-06-22 MED ORDER — SODIUM CHLORIDE 0.9 % IV BOLUS: 500.0000 mL | Freq: Once | INTRAVENOUS | Status: DC

## 2018-06-22 MED ORDER — METRONIDAZOLE IN NACL 5-0.79 MG/ML-% IV SOLN
500.0000 mg | Freq: Three times a day (TID) | INTRAVENOUS | Status: DC
  Filled 2018-06-22 (×2): qty 100

## 2018-06-22 MED ORDER — METOPROLOL TARTRATE 5 MG/5ML IV SOLN
2.5000 mg | INTRAVENOUS | Status: DC | PRN
  Administered 2018-06-26 – 2018-07-01 (×8): 2.5 mg via INTRAVENOUS
  Filled 2018-06-22 (×8): qty 5

## 2018-06-22 MED ORDER — VANCOMYCIN HCL IN DEXTROSE 750-5 MG/150ML-% IV SOLN
750.0000 mg | Freq: Two times a day (BID) | INTRAVENOUS | Status: DC
  Administered 2018-06-23 (×3): 750 mg via INTRAVENOUS
  Filled 2018-06-22 (×4): qty 150

## 2018-06-22 MED ORDER — PANTOPRAZOLE SODIUM 40 MG PO PACK
40.0000 mg | PACK | Freq: Every day | ORAL | Status: DC
  Administered 2018-06-22 – 2018-06-27 (×6): 40 mg
  Filled 2018-06-22 (×8): qty 20

## 2018-06-22 MED ORDER — FUROSEMIDE 10 MG/ML IJ SOLN
20.0000 mg | Freq: Once | INTRAMUSCULAR | Status: AC
  Administered 2018-06-22: 20 mg via INTRAVENOUS
  Filled 2018-06-22: qty 2

## 2018-06-22 MED ORDER — MAGNESIUM SULFATE 2 GM/50ML IV SOLN
2.0000 g | Freq: Once | INTRAVENOUS | Status: AC
  Administered 2018-06-22: 2 g via INTRAVENOUS
  Filled 2018-06-22 (×2): qty 50

## 2018-06-22 MED ORDER — PANTOPRAZOLE SODIUM 40 MG PO PACK
40.0000 mg | PACK | Freq: Every day | ORAL | Status: DC
  Filled 2018-06-22: qty 20

## 2018-06-22 MED ORDER — MIDAZOLAM HCL 2 MG/2ML IJ SOLN
2.0000 mg | INTRAMUSCULAR | Status: DC | PRN
  Administered 2018-06-22 – 2018-06-27 (×9): 2 mg via INTRAVENOUS
  Filled 2018-06-22 (×8): qty 2

## 2018-06-22 MED ORDER — MIDAZOLAM HCL 2 MG/2ML IJ SOLN
INTRAMUSCULAR | Status: AC
  Filled 2018-06-22: qty 2

## 2018-06-22 MED ORDER — FENTANYL CITRATE (PF) 100 MCG/2ML IJ SOLN
INTRAMUSCULAR | Status: AC
  Filled 2018-06-22: qty 2

## 2018-06-22 MED ORDER — ETOMIDATE 2 MG/ML IV SOLN
10.0000 mg | Freq: Once | INTRAVENOUS | Status: AC
  Administered 2018-06-22: 10 mg via INTRAVENOUS

## 2018-06-22 MED ORDER — DEXTROSE 50 % IV SOLN
INTRAVENOUS | Status: AC
  Filled 2018-06-22: qty 50

## 2018-06-22 MED ORDER — DEXTROSE 5 % IN LACTATED RINGERS IV BOLUS
750.0000 mL | Freq: Once | INTRAVENOUS | Status: AC
  Administered 2018-06-22: 750 mL via INTRAVENOUS

## 2018-06-22 MED ORDER — DEXMEDETOMIDINE HCL IN NACL 400 MCG/100ML IV SOLN
0.0000 ug/kg/h | INTRAVENOUS | Status: AC
  Administered 2018-06-22: 0.6 ug/kg/h via INTRAVENOUS
  Administered 2018-06-22: 0.4 ug/kg/h via INTRAVENOUS
  Administered 2018-06-23: 0.8 ug/kg/h via INTRAVENOUS
  Administered 2018-06-23: 1.1 ug/kg/h via INTRAVENOUS
  Administered 2018-06-23: 0.6 ug/kg/h via INTRAVENOUS
  Administered 2018-06-24 – 2018-06-25 (×7): 1.2 ug/kg/h via INTRAVENOUS
  Filled 2018-06-22 (×12): qty 100

## 2018-06-22 MED ORDER — POTASSIUM CHLORIDE 10 MEQ/100ML IV SOLN
10.0000 meq | INTRAVENOUS | Status: AC
  Administered 2018-06-22 (×3): 10 meq via INTRAVENOUS
  Filled 2018-06-22 (×3): qty 100

## 2018-06-22 MED ORDER — METRONIDAZOLE IN NACL 5-0.79 MG/ML-% IV SOLN
500.0000 mg | Freq: Three times a day (TID) | INTRAVENOUS | Status: DC
  Administered 2018-06-22 – 2018-06-24 (×6): 500 mg via INTRAVENOUS
  Filled 2018-06-22 (×7): qty 100

## 2018-06-22 MED ORDER — MIDAZOLAM HCL 2 MG/2ML IJ SOLN
2.0000 mg | INTRAMUSCULAR | Status: AC | PRN
  Administered 2018-06-22 – 2018-06-23 (×3): 2 mg via INTRAVENOUS
  Filled 2018-06-22 (×4): qty 2

## 2018-06-22 MED ORDER — SODIUM CHLORIDE 0.9 % IV BOLUS
500.0000 mL | Freq: Once | INTRAVENOUS | Status: AC
  Administered 2018-06-22: 12:00:00 via INTRAVENOUS

## 2018-06-22 MED ORDER — VANCOMYCIN HCL 10 G IV SOLR
1250.0000 mg | Freq: Once | INTRAVENOUS | Status: AC
  Administered 2018-06-22: 1250 mg via INTRAVENOUS
  Filled 2018-06-22: qty 1250

## 2018-06-22 MED ORDER — PANTOPRAZOLE SODIUM 40 MG PO PACK: 40.0000 mg | PACK | Freq: Every day | ORAL | Status: DC

## 2018-06-22 MED ORDER — CHLORHEXIDINE GLUCONATE 0.12% ORAL RINSE (MEDLINE KIT)
15.0000 mL | Freq: Two times a day (BID) | OROMUCOSAL | Status: DC
  Administered 2018-06-22 – 2018-07-07 (×30): 15 mL via OROMUCOSAL

## 2018-06-22 MED ORDER — PHENYLEPHRINE HCL-NACL 10-0.9 MG/250ML-% IV SOLN
0.0000 ug/min | INTRAVENOUS | Status: DC
  Administered 2018-06-22: 100 ug/min via INTRAVENOUS
  Administered 2018-06-22: 30 ug/min via INTRAVENOUS
  Administered 2018-06-23 (×2): 10 ug/min via INTRAVENOUS
  Administered 2018-06-24: 15 ug/min via INTRAVENOUS
  Administered 2018-06-24: 10 ug/min via INTRAVENOUS
  Administered 2018-06-25: 20 ug/min via INTRAVENOUS
  Administered 2018-06-25: 90 ug/min via INTRAVENOUS
  Filled 2018-06-22 (×8): qty 250

## 2018-06-22 MED ORDER — FENTANYL CITRATE (PF) 100 MCG/2ML IJ SOLN
100.0000 ug | Freq: Once | INTRAMUSCULAR | Status: AC
  Administered 2018-06-22: 100 ug via INTRAVENOUS

## 2018-06-22 MED ORDER — FENTANYL CITRATE (PF) 100 MCG/2ML IJ SOLN
100.0000 ug | INTRAMUSCULAR | Status: DC | PRN
  Administered 2018-06-24 – 2018-06-25 (×6): 100 ug via INTRAVENOUS
  Filled 2018-06-22 (×5): qty 2

## 2018-06-22 NOTE — Clinical Social Work Note (Signed)
Clinical Social Work Assessment  Patient Details  Name: LELAND LEIDEL MRN: [id number] Date of Birth: 15-May-1956  Date of referral:  06/22/18               Reason for consult:  Facility Placement, Discharge Planning                Permission sought to share information with:  Family Supports Permission granted to share information::  Yes, Release of Information Signed  Name::     [name]  Agency::  family  Relationship::  son   Contact Information:  [name] [phone]  Housing/Transportation Living arrangements for the past 2 months:  Skilled Nursing Facility([facility] and [facility] ) Source of Information:  Adult Children Patient Interpreter Needed:  None Criminal Activity/Legal Involvement Pertinent to Current Situation/Hospitalization:  No - Comment as needed Significant Relationships:  Adult Children Lives with:  Facility Resident Do you feel safe going back to the place where you live?  Yes Need for family participation in patient care:  Yes (Comment)  Care giving concerns:  CSW consulted as pt is from [facility].    Social Worker assessment / plan:  CSW spoke with pt's son [name] as Charity fundraiser reported that pt is not alert and oriented at this time. CSW spoke with [name] and was informed that pt has been at [facility] for a little over a month. Son expressed that pt is at [facility] on [address]. [name] expressed that there was no other place to take pt has pt is needing long term care. CSW sought further details on concerns with [facility]. Son expressed that the staff at the facility is awesome however the building itself isnt what pt or son were looking for exactly. Son expressed that pt has a sensitivity to smell and that is also an issue along with yelling of other pt's and staff members.   Son expressed that at this time the plan is still for pt to return back to [facility] at the time of discharge.   Employment status:  Disabled (Comment on  whether or not currently receiving Disability) Insurance information:  Managed Care, Self Pay (Medicaid Pending)(per Tammy from [facility] pt has Medicaid. ) PT Recommendations:  Not assessed at this time Information / Referral to community resources:  Skilled Nursing Facility  Patient/Family's Response to care:  Pt's son appeared to be understanding and agreeable to plan of care at this time.   Patient/Family's Understanding of and Emotional Response to Diagnosis, Current Treatment, and Prognosis:  No further questions or concerns have been presented to CSW at this time. Emotional response was positive and understanding at this time.   Emotional Assessment Appearance:  Appears stated age Attitude/Demeanor/Rapport:  Unable to Assess Affect (typically observed):  Unable to Assess Orientation:  (unable to assess. ) Alcohol / Substance use:  Not Applicable Psych involvement (Current and /or in the community):  No (Comment)  Discharge Needs  Concerns to be addressed:  Care Coordination Readmission within the last 30 days:  No Current discharge risk:  Dependent with Mobility Barriers to Discharge:  Continued Medical Work up   [employer], LCSWA 06/22/2018, 10:21 AM

## 2018-06-22 NOTE — Progress Notes (Signed)
VASCULAR LAB PRELIMINARY  PRELIMINARY  PRELIMINARY  PRELIMINARY  Bilateral lower extremity venous duplex completed.    Preliminary report:  There is acute, highly mobile DVT noted in the proximal left femoral vein.  There is acute occlusive DVT noted in the mid to distal femoral vein, popliteal, and posterior tibial and peroneal veins.  Nefertiti Mohamad, RVT 06/22/2018, 10:23 AM

## 2018-06-22 NOTE — Progress Notes (Signed)
Called by Rapid for PRN breathing treatment. BBS noted for expiratory wheezing. Patient has increased WOB, RR in 40's, HR in 130's.  Patient agitation resolved. HR 113, RR 18.

## 2018-06-22 NOTE — Progress Notes (Signed)
Pt became agitated HR 130"s-160's, RR 30's-40's. Rapid and Respiratory called to bedside.

## 2018-06-22 NOTE — Progress Notes (Signed)
Gave pt 0.5mg  of Ativan IV. Wasted 1.5mg  in sharps with Tish, Rn as a witness.

## 2018-06-22 NOTE — Progress Notes (Signed)
Jovita Kussmaul, NP at bedside to reassess pt's periodic labored breathing and agitation post transfer from 2W.  Ativan given per order with reduction in agitation noted.  Will continue to monitor.

## 2018-06-22 NOTE — Procedures (Signed)
Intubation Procedure Note JEANINNE LODICO [id number] 03/09/1956  Procedure: Intubation Indications: Respiratory insufficiency  Procedure Details Consent: Unable to obtain consent because of altered level of consciousness. Time Out: Verified patient identification, verified procedure, site/side was marked, verified correct patient position, special equipment/implants available, medications/allergies/relevent history reviewed, required imaging and test results available.  Performed  Maximum sterile technique was used including gloves, hand hygiene and mask.  MAC and 3  Versed 2  fent 100  Mcg , another 100 after intubation for biting down ETT Etomidate 20 , another 10 after intubation  Evaluation Hemodynamic Status: BP stable throughout; O2 sats: stable throughout Patient's Current Condition: stable Complications: No apparent complications Patient did tolerate procedure well. Chest X-ray ordered to verify placement.  CXR: pending.   Leanna Sato Elsworth S[place] 06/22/2018

## 2018-06-22 NOTE — Progress Notes (Signed)
[place] for Heparin Indication: pulmonary embolus  Allergies  Allergen Reactions  . Avelox [Moxifloxacin Hcl In Nacl] Other (See Comments)    Mental breakdown  . Amoxicillin Hives  . Pamelor [Nortriptyline Hcl] Other (See Comments)    Made her want to hurt herself  . Penicillins Hives     Has patient had a PCN reaction causing immediate rash, facial/tongue/throat swelling, SOB or lightheadedness with hypotension: Yes Has patient had a PCN reaction causing severe rash involving mucus membranes or skin necrosis: Unk Has patient had a PCN reaction that required hospitalization: Unk Has patient had a PCN reaction occurring within the last 10 years: Unk If all of the above answers are "NO", then may proceed with Cephalosporin use.   . Other Itching    Any med that ends in -caine  . Hydrocodone Itching  . Oxycodone Itching  . Sulfa Antibiotics Other (See Comments)    "Allergic," per [facility]    Patient Measurements: Height: '5\' 3"'  (160 cm) Weight: 151 lb 7.3 oz (68.7 kg) IBW/kg (Calculated) : 52.4 Heparin Dosing Weight: 68 kg  Vital Signs: Temp: 97.9 F (36.6 C) (10/14 1123) Temp Source: Axillary (10/14 1123) BP: 94/63 (10/14 1200) Pulse Rate: 95 (10/14 1200)  Labs: Recent Labs    06/20/18 0251  06/21/18 0635 06/22/18 0342 06/22/18 1218  HGB 9.3*  --  8.7* 8.0*  --   HCT 30.3*  --  28.7* 25.7*  --   PLT 393  --  238 226  --   HEPARINUNFRC 0.17*   < > 0.34 0.20* 0.34  CREATININE 1.32*  --  1.12* 1.18*  --    < > = values in this interval not displayed.    Estimated Creatinine Clearance: 47.1 mL/min (A) (by C-G formula based on SCr of 1.18 mg/dL (H)).   Medications:  Scheduled:  . budesonide (PULMICORT) nebulizer solution  0.5 mg Nebulization BID  . chlorhexidine gluconate (MEDLINE KIT)  15 mL Mouth Rinse BID  . mouth rinse  15 mL Mouth Rinse BID  . mouth rinse  15 mL Mouth Rinse 10 times per day  . pantoprazole sodium  40 mg  Per Tube Q1200  . povidone-iodine   Topical BID    Assessment: 62 yo F presents with SOB. CT angio shows acute PE with heart strain. No anticoag PTA.  Patient's heparin level this AM was subtherapeutic at 0.2, however, the heparin drip was off for at least 30 minutes prior to lab drawn.    Patient's heparin level is on the lower end of therapeutic this afternoon (0.34). Hemoglobin has trended down from 8.7 to 8.0, but pltc remains stable. Nurse stated patient had slight bleeding at IV line today, but it is controlled now.  Of note, patient's preliminary bilateral lower extremity venous duplex reports "highly mobile DVT noted in the proximal left femoral vein", so will increase patient's heparin rate to ensure adequate coverage.  Goal of Therapy:  Heparin level 0.3-0.7 units/ml Monitor platelets by anticoagulation protocol: Yes   Plan:  Increase heparin to 750 units/hr  Check heparin level in 6 hours Daily heparin level, CBC Monitor closely for signs/symptoms of bleeding  Thank you for allowing pharmacy to be a part of this patient's care.  Leron Croak, PharmD PGY1 Pharmacy Resident Phone: [phone]  Please check AMION for all [place] phone numbers

## 2018-06-22 NOTE — Progress Notes (Addendum)
NAME:  [name], MRN:  [id number], DOB:  [birth year], LOS: 5 ADMISSION DATE:  06/17/2018, CONSULTATION DATE:  06/17/2018 REFERRING MD:  Dr. Lockie Mola, CHIEF COMPLAINT:  PE   Brief History   62 year old female hx CVA, COPD, who resides in SNF after recent ICH / IVH with L sided weakness. Now presenting with hypoxia and PE. Started on heparin infusion.   10/14 AM patient became progressively lethargic with tachypnea and hypoxia. Patient transferred back to ICU for closer monitoring.   Past Medical History  Atrial fibrillation, COPD, CHF, SVA, IVH 03/2018  Significant Hospital Events   10/9 > Admit Acute PE 10/10 > Speech Evaluation > Mild aspiration Risk >  No aspiration Noted  10/11 > Transfer to [facility]   Consults: date of consult/date signed off & final recs:  PCCM 10/9 > Neurology 10/9 >  Procedures (surgical and bedside):  N/A   Significant Diagnostic Tests:  10/9 CTA PE > Positive for acute PE primarily within the right pulmonary arterial system with CT evidence of right heart strain (RV/LV Ratio is greater than 1). Mild alveolar edema is noted particularly in the right upper lobe. Small effusions and bibasilar dependent atelectatic changes. Aortic Atherosclerosis and Emphysema  Head Ct 10/9 >> IVH resolved, chronic right MCA infarct, no acute abnormality Echocardiogram 10/10 > Left ventricle: The cavity size was normal. There was mild focal   basal hypertrophy of the septum. Systolic function was normal.   The estimated ejection fraction was in the range of 55% to 60%.   Wall motion was normal; there were no regional wall motion   abnormalities. Features are consistent with a pseudonormal left   ventricular filling pattern, with concomitant abnormal relaxation   and increased filling pressure (grade 2 diastolic dysfunction). - Aortic valve: There was trivial regurgitation. - Mitral valve: There was mild regurgitation. - Left atrium: The atrium was severely dilated. CXR  10/13 > Cardiomegaly.New moderate left effusion with underlying opacities which may represent atelectasis or infection. Heterogeneous opacities right lung base may represent atelectasis.   Micro Data:  BCx 2 10/9 >>   UC 10/9 > Klebsiella Pneumoniae  Blood 10/14 >   Antimicrobials:  Ceftriaxone 10/9 > 10/13  Cefepime/Flagyl 10/14 >>  Subjective:    Objective   Blood pressure (!) 149/117, pulse (!) 107, temperature (!) 97.5 F (36.4 C), temperature source Oral, resp. rate 18, height 5\' 4"  (1.626 m), weight 68.9 kg, SpO2 99 %.    FiO2 (%):  [31 %] 31 %   Intake/Output Summary (Last 24 hours) at 06/22/2018 0411 Last data filed at 06/21/2018 1755 Gross per 24 hour  Intake 676.47 ml  Output 450 ml  Net 226.47 ml   Filed Weights   06/19/18 0500 06/20/18 0447 06/21/18 0415  Weight: 70.3 kg 69.7 kg 68.9 kg    Examination: General: Chronically ill appearing elderly female, moderate respiratory distress  HENT: Dry MM Lungs: Tachypnea, Rhonchi Cardiovascular: Irregular, Tachy Abdomen: Soft, nondistended, active bowel sounds Extremities: Contracted left upper/lower extremity  Neuro: Lethargic, does not follows commands, pupils intact GU: Foley in place   Resolved Hospital Problem list    Assessment & Plan:   Acute Hypoxic Respiratory Distress Left Side Pleural Effusion  Concern for Aspiration, CXR with right base opacity   Acute PE Has been off anticoagulation since ICH in July.  H/O COPD Plan  -Wean Supplemental Oxygen Maintain Saturation >92  -Lasix 20 meq  -NT Suction as needed  -Monitor for need of intubation  due to acute encephalopathy and increased secretions -Given concern for aspiration event and right base opacity will start Cefepime/Flagyl (given penicillin allergy) and send Respiratory Culture    Diastolic HF (EF 55-60, G2DD)  A.Fib with RVR  Acute PE  H/O HTN, CAD s/p CABG, HLD Plan -Cardiac Monitoring  -Continue heparin infusion -PO Metoprolol and  Cardizem on hold due to inability to take PO medications at this time  -PRN Metoprolol, May need Cardizem gtt (currently HR 102 after 5 mg Metoprolol)   AKI > Improving  Hypomagnesemia Hypokalemia  Urinary Retention  Plan  -Trend BMP -Replace electrolytes as indicated  -Lasix as above -Continue Foley   Dysphagia  Plan  -NPO -Continue to Follow with Speech Therapy   Klebsiella UTI -Completed 5 days of ceftriaxone   Acute Encephalopathy  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 Pain/Anxiety/Delirium protocol (if indicated): n/a VAP protocol (if indicated): n/a DVT prophylaxis: full dose heparin GI prophylaxis: Pepcid Hyperglycemia protocol:  Mobility: bed rest Code Status: Full Family Communication: Son updated on 10/9  Labs   CBC: Recent Labs  Lab 06/17/18 1515 06/18/18 0726 06/19/18 0336 06/20/18 0251 06/21/18 0635 06/22/18 0342  WBC 6.3 6.1 6.7 10.6* 6.8 5.5  NEUTROABS 4.6  --   --   --   --   --   HGB 10.2* 9.4* 9.1* 9.3* 8.7* 8.0*  HCT 32.3* 31.9* 29.2* 30.3* 28.7* 25.7*  MCV 99.1 100.6* 98.0 98.4 100.3* 98.5  PLT 253 242 258 393 238 226    Basic Metabolic Panel: Recent Labs  Lab 06/17/18 1515 06/18/18 0308 06/18/18 1525 06/19/18 0336 06/20/18 0251 06/21/18 0635  NA 139 137  --  138 139 139  K 3.5 3.9  --  3.5 4.1 3.7  CL 103 106  --  108 108 108  CO2 25 22  --  23 22 23   GLUCOSE 91 145*  --  89 101* 79  BUN 20 18  --  12 19 15   CREATININE 1.42* 1.34*  --  1.00 1.32* 1.12*  CALCIUM 7.8* 7.2*  --  7.5* 8.3* 8.2*  MG  --  0.9* 3.1* 2.5*  --   --   PHOS  --  3.7  --   --   --   --    GFR: Estimated Creatinine Clearance: 50.9 mL/min (A) (by C-G formula  based on SCr of 1.12 mg/dL (H)). Recent Labs  Lab 06/17/18 1706 06/18/18 0308 06/18/18 0726 06/19/18 0336 06/20/18 0251 06/21/18 0635 06/22/18 0342  PROCALCITON  --  <0.10  --   --   --   --   --   WBC  --   --  6.1 6.7 10.6* 6.8 5.5  LATICACIDVEN 2.34*  --  2.1*  --   --   --   --     Liver Function Tests: Recent Labs  Lab 06/17/18 1515 06/20/18 0251  AST 19 22  ALT 9 9  ALKPHOS 85 71  BILITOT 0.6 0.3  PROT 5.8* 5.9*  ALBUMIN 2.5* 2.7*   No results for input(s): LIPASE, AMYLASE in the last 168 hours. No results for input(s): AMMONIA in the last 168 hours.  ABG  Component Value Date/Time   PHART 7.390 06/21/2018 2045   PCO2ART 39.0 06/21/2018 2045   PO2ART PENDING 06/21/2018 2045   HCO3 23.3 06/21/2018 2045   TCO2 26 06/17/2018 1551   ACIDBASEDEF 1.1 06/21/2018 2045   O2SAT 50.9 06/21/2018 2045     Coagulation Profile: No results for input(s): INR, PROTIME in the last 168 hours.  Cardiac Enzymes: No results for input(s): CKTOTAL, CKMB, CKMBINDEX, TROPONINI in the last 168 hours.  HbA1C: Hgb A1c MFr Bld  Date/Time Value Ref Range Status  03/21/2018 06:50 AM 5.1 4.8 - 5.6 % Final    Comment:    (NOTE) Pre diabetes:          5.7%-6.4% Diabetes:              >6.4% Glycemic control for   <7.0% adults with diabetes   02/11/2011 06:00 AM  <5.7 % Final   5.4 (NOTE)                                                                       According to the ADA Clinical Practice Recommendations for 2011, when HbA1c is used as a screening test:   >=6.5%   Diagnostic of Diabetes Mellitus           (if abnormal result  is confirmed)  5.7-6.4%   Increased risk of developing Diabetes Mellitus  References:Diagnosis and Classification of Diabetes Mellitus,Diabetes Care,2011,34(Suppl 1):S62-S69 and Standards of Medical Care in         Diabetes - 2011,Diabetes Care,2011,34  (Suppl 1):S11-S61.    CBG: No results for input(s): GLUCAP in the last 168 hours.  Admitting  History of Present Illness.   62 year old female with extensive PMH significant for but not limited to tobacco abuse, COPD, sleep apnea, systolic HF, A. Fib (no AC), CVA, GERD, HTN, Hypothyroidism, and dementia presenting from [facility] for dry cough and shortness of breath with recent mild lower extremity edema.  Staff had difficulty obtaining O2 sats and found by EMS with O2 sat of 76% on room air with rales and rhonchi.  No reports of fever, chest pain, nausea, vomiting.  Normal appetite.   In ER, labs noted for neg troponin, sCr 1.42, VBG 7.3/ 45/ 30, Hgb 10.2, normal WBC, lactic 2.34, normal LFTs, . UTI noted for moderate leukocytes, bacteria, mucous, and WBC> 50.  CXR without pulmonary edema or obvious infiltrate, noted for stable cardiomegaly. She was persistently hypoxic requiring ongoing BiPAP and hypotensive requiring fluid bolus.  CTA PE positive, noted for R PE with RV/ LV ratio 1.  PCCM consulted for admission.   CC time 45 minutes   Jovita Kussmaul, AGACNP-BC [facility]  PCCM Pgr: [phone]

## 2018-06-22 NOTE — Progress Notes (Signed)
Pharmacy Antibiotic Note  [name] is a 62 y.o. female admitted on 06/17/2018 with PE/UTI.  Pharmacy has been consulted for Cefepime/Flagyl dosing for possible aspiration PNA. WBC WNL. CrCl ~45-50.  Noted PCN allergy-tolerated ceftriaxone this admission, should be OK with Cefepime   Plan: Cefepime 1g IV q12h Flagyl 500 mg IV q8h Trend WBC, temp, renal function  F/U infectious work-up   Height: 5\' 4"  (162.6 cm) Weight: 151 lb 7.3 oz (68.7 kg) IBW/kg (Calculated) : 54.7  Temp (24hrs), Avg:97 F (36.1 C), Min:96.1 F (35.6 C), Max:97.5 F (36.4 C)  Recent Labs  Lab 06/17/18 1706 06/18/18 0308 06/18/18 0726 06/19/18 0336 06/20/18 0251 06/21/18 0635 06/22/18 0342  WBC  --   --  6.1 6.7 10.6* 6.8 5.5  CREATININE  --  1.34*  --  1.00 1.32* 1.12* 1.18*  LATICACIDVEN 2.34*  --  2.1*  --   --   --  3.1*    Estimated Creatinine Clearance: 48.3 mL/min (A) (by C-G formula based on SCr of 1.18 mg/dL (H)).    Allergies  Allergen Reactions  . Avelox [Moxifloxacin Hcl In Nacl] Other (See Comments)    Mental breakdown  . Amoxicillin Hives  . Pamelor [Nortriptyline Hcl] Other (See Comments)    Made her want to hurt herself  . Penicillins Hives     Has patient had a PCN reaction causing immediate rash, facial/tongue/throat swelling, SOB or lightheadedness with hypotension: Yes Has patient had a PCN reaction causing severe rash involving mucus membranes or skin necrosis: Unk Has patient had a PCN reaction that required hospitalization: Unk Has patient had a PCN reaction occurring within the last 10 years: Unk If all of the above answers are "NO", then may proceed with Cephalosporin use.   . Other Itching    Any med that ends in -caine  . Hydrocodone Itching  . Oxycodone Itching  . Sulfa Antibiotics Other (See Comments)    "Allergic," per [facility]    Abran Duke 06/22/2018 5:45 AM

## 2018-06-22 NOTE — Progress Notes (Addendum)
Pharmacy Antibiotic Note  [name] is a 62 y.o. female admitted on 06/17/2018 with PE/UTI, now presenting with concerns of aspiration pneumonia.  Pharmacy has been consulted for vancomycin dosing. Patient is s/p 5 day antibiotic course with ceftriaxone. Patient was initiated on vancomycin, cefepime and metronidazole on 10/14 with concerns for possible aspiration. Patient is currently afebrile, WBC remains wnl at 5.5, and Scr is 1.18 (~CrCl 45 - 50 mL/min).   Plan: Give Vancomycin loading dose of 1250 mg IV x 1 Start Vancomycin 750 mg IV Q12H Monitor renal function, CBC, LOT, clinical progress, VT at steady state Follow up cultures/sensitivities   Height: 5\' 4"  (162.6 cm) Weight: 151 lb 7.3 oz (68.7 kg) IBW/kg (Calculated) : 54.7  Temp (24hrs), Avg:97.4 F (36.3 C), Min:96.6 F (35.9 C), Max:98.3 F (36.8 C)  Recent Labs  Lab 06/17/18 1706 06/18/18 0308 06/18/18 0726 06/19/18 0336 06/20/18 0251 06/21/18 0635 06/22/18 0342  WBC  --   --  6.1 6.7 10.6* 6.8 5.5  CREATININE  --  1.34*  --  1.00 1.32* 1.12* 1.18*  LATICACIDVEN 2.34*  --  2.1*  --   --   --  3.1*    Estimated Creatinine Clearance: 48.3 mL/min (A) (by C-G formula based on SCr of 1.18 mg/dL (H)).    Allergies  Allergen Reactions  . Avelox [Moxifloxacin Hcl In Nacl] Other (See Comments)    Mental breakdown  . Amoxicillin Hives  . Pamelor [Nortriptyline Hcl] Other (See Comments)    Made her want to hurt herself  . Penicillins Hives     Has patient had a PCN reaction causing immediate rash, facial/tongue/throat swelling, SOB or lightheadedness with hypotension: Yes Has patient had a PCN reaction causing severe rash involving mucus membranes or skin necrosis: Unk Has patient had a PCN reaction that required hospitalization: Unk Has patient had a PCN reaction occurring within the last 10 years: Unk If all of the above answers are "NO", then may proceed with Cephalosporin use.   . Other Itching    Any med  that ends in -caine  . Hydrocodone Itching  . Oxycodone Itching  . Sulfa Antibiotics Other (See Comments)    "Allergic," per [facility]    Antimicrobials this admission: Vancomycin 10/14 >>  Cefepime 10/14 >> Flagyl 10/14 >> Ceftriaxone 10/10 >> 10/14  Dose adjustments this admission: N/A  Microbiology results: 10/14 Trach aspirate - Few GPC in clusters; Few GPR; rare budding yeast seen 10/9 MRSA PCR - negative 10/9 BCx - NGTD 10/9 UCx - 100k K.pneumo (final)  Thank you for allowing pharmacy to be a part of this patient's care.  Lenord Carbo, PharmD PGY1 Pharmacy Resident Phone: [phone]  Please check AMION for all [facility] phone numbers 06/22/2018 11:22 AM

## 2018-06-22 NOTE — Progress Notes (Signed)
ANTICOAGULATION CONSULT NOTE  Pharmacy Consult for Heparin Indication: pulmonary embolus  Allergies  Allergen Reactions  . Avelox [Moxifloxacin Hcl In Nacl] Other (See Comments)    Mental breakdown  . Amoxicillin Hives  . Pamelor [Nortriptyline Hcl] Other (See Comments)    Made her want to hurt herself  . Penicillins Hives     Has patient had a PCN reaction causing immediate rash, facial/tongue/throat swelling, SOB or lightheadedness with hypotension: Yes Has patient had a PCN reaction causing severe rash involving mucus membranes or skin necrosis: Unk Has patient had a PCN reaction that required hospitalization: Unk Has patient had a PCN reaction occurring within the last 10 years: Unk If all of the above answers are "NO", then may proceed with Cephalosporin use.   . Other Itching    Any med that ends in -caine  . Hydrocodone Itching  . Oxycodone Itching  . Sulfa Antibiotics Other (See Comments)    "Allergic," per [facility]    Patient Measurements: Height: 5\' 4"  (162.6 cm) Weight: 151 lb 14.4 oz (68.9 kg) IBW/kg (Calculated) : 54.7 Heparin Dosing Weight: 68 kg  Vital Signs: Temp: 97.5 F (36.4 C) (10/13 2345) Temp Source: Oral (10/13 2345) BP: 187/150 (10/14 0436) Pulse Rate: 126 (10/14 0436)  Labs: Recent Labs    06/20/18 0251 06/20/18 1308 06/21/18 0635 06/22/18 0342  HGB 9.3*  --  8.7* 8.0*  HCT 30.3*  --  28.7* 25.7*  PLT 393  --  238 226  HEPARINUNFRC 0.17* 0.38 0.34 0.20*  CREATININE 1.32*  --  1.12* 1.18*    Estimated Creatinine Clearance: 48.3 mL/min (A) (by C-G formula based on SCr of 1.18 mg/dL (H)).   Medications:  Scheduled:  . budesonide (PULMICORT) nebulizer solution  0.5 mg Nebulization BID  . furosemide  20 mg Intravenous Once  . ipratropium  0.5 mg Nebulization BID  . levalbuterol  0.63 mg Nebulization BID  . povidone-iodine   Topical BID    Assessment: 62 yo F presents with SOB. CT angio shows acute PE with heart strain. No anticoag  PTA.  Patient's heparin rate increased yesterday morning and therapeutic HL in afternoon.   Patient's HL continues to be therapeutic this morning. CBC low but stable. Continuing current infusion regimen. No bleeding noted.   10/14 AM update: heparin level is low at 0.2, however, the heparin drip was off for at least 30 minutes prior to lab drawn, will continue at current rate and re-time heparin level for 1300  Goal of Therapy:  Heparin level 0.3-0.7 units/ml Monitor platelets by anticoagulation protocol: Yes   Plan:  Continue heparin 700 units/hr given off time Re-check heparin level at 1300  Abran Duke, PharmD, BCPS Clinical Pharmacist Phone: [phone]

## 2018-06-22 NOTE — Progress Notes (Signed)
CRITICAL VALUE ALERT  Critical Value:  Lactic Acid 4.2  Date & Time Notied:  06/22/18 1348   Provider Notified: Dr. Vassie Loll @ 1350  Orders Received/Actions taken: none at this time

## 2018-06-22 NOTE — Progress Notes (Signed)
eLink Physician-Brief Progress Note Patient Name: [name] DOB: 07/16/1956 MRN: [id number]   Date of Service  06/22/2018  HPI/Events of Note  Hypertension - BP = 187/150. Now BP = 103/82.  eICU Interventions  Will order: 1. Increase Metoprolol to 2.5 - 5 mg IV PRN HR > 120. 2. Hydralazine 10 mg IV Q 4 hours PRN SBP > 170 or SBP > 100.     Intervention Category Major Interventions: Hypertension - evaluation and management  Jeray Shugart Eugene 06/22/2018, 4:52 AM

## 2018-06-22 NOTE — Progress Notes (Signed)
eLink Physician-Brief Progress Note Patient Name: [name] DOB: Dec 19, 1955 MRN: [id number]   Date of Service  06/22/2018  HPI/Events of Note  K+ replaced today. BMP not rechecked post replacement.   eICU Interventions  Will replace K+.      Intervention Category Major Interventions: Electrolyte abnormality - evaluation and management  Brandin Stetzer Eugene 06/22/2018, 9:41 PM

## 2018-06-22 NOTE — Progress Notes (Signed)
Stable post-intubation Weaning pressors.   Plan Cont course as outlined in progress note.   Simonne Martinet ACNP-BC [facility] Pulmonary/Critical Care Pager # [phone] OR # [phone] if no answer

## 2018-06-22 NOTE — Progress Notes (Addendum)
NAME:  [name], MRN:  [id number], DOB:  1956/05/16, LOS: 5 ADMISSION DATE:  06/17/2018, CONSULTATION DATE:  06/17/2018 REFERRING MD:  Dr. Lockie Mola, CHIEF COMPLAINT:  PE   Brief History   62 year old female hx CVA, COPD, who resides in SNF after recent ICH / IVH with L sided weakness. Now presenting with hypoxia and PE. Started on heparin infusion.   10/14 AM patient became progressively lethargic with tachypnea and hypoxia. Patient transferred back to ICU for closer monitoring.   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 10/14: Critical care reconsulted for respiratory distress  Consults: date of consult/date signed off & final recs:  PCCM 10/9 > Neurology 10/9 >  Procedures (surgical and bedside):  N/A   Significant Diagnostic Tests:  10/9 CTA PE > Positive for acute PE primarily within the right pulmonary arterial system with CT evidence of right heart strain (RV/LV Ratio is greater than 1). Mild alveolar edema is noted particularly in the right upper lobe. Small effusions and bibasilar dependent atelectatic changes. Aortic Atherosclerosis and Emphysema  Head Ct 10/9 >> IVH resolved, chronic right MCA infarct, no acute abnormality Echocardiogram 10/10 > Left ventricle: The cavity size was normal. There was mild focal   basal hypertrophy of the septum. Systolic function was normal.   The estimated ejection fraction was in the range of 55% to 60%.   Wall motion was normal; there were no regional wall motion   abnormalities. Features are consistent with a pseudonormal left   ventricular filling pattern, with concomitant abnormal  relaxation   and increased filling pressure (grade 2 diastolic dysfunction). - Aortic valve: There was trivial regurgitation. - Mitral valve: There was mild regurgitation. - Left atrium: The atrium was severely dilated. CXR 10/13 > Cardiomegaly.New moderate left effusion with underlying opacities which may represent atelectasis or infection. Heterogeneous opacities right lung base may represent atelectasis.   Micro Data:  BCx 2 10/9 >>   UC 10/9 > Klebsiella Pneumoniae  Blood 10/14 >   Antimicrobials:  Ceftriaxone 10/9 > 10/13  Cefepime/Flagyl 10/14 >>  Subjective:  Nonverbal.  Does appear to have respiratory distress  Objective   Blood pressure (Abnormal) 158/118, pulse (Abnormal) 123, temperature 98.3 F (36.8 C), temperature source Axillary, resp. rate (Abnormal) 36, height 5\' 4"  (1.626 m), weight 68.7 kg, SpO2 95 %.    FiO2 (%):  [31 %] 31 %   Intake/Output Summary (Last 24 hours) at 06/22/2018 0851 Last data filed at 06/22/2018 1505 Gross per 24 hour  Intake 986.51 ml  Output 200 ml  Net 786.51 ml   Filed Weights   06/20/18 0447 06/21/18 0415 06/22/18 0500  Weight: 69.7 kg 68.9 kg 68.7 kg    Examination: General: This is acute on chronic appearing 62 year old white female currently lying in bed.  She is encephalopathic, but otherwise nonfocal. HEENT mucous membranes are dry, neck veins flat, sclera nonicteric, pupils equal and reactive. Pulmonary: Coarse scattered right greater than left breath sounds right side worse than left.  She does have some mild respiratory retractions Cardiac: Tachycardic irregular.  Atrial fibrillation on telemetry with rapid ventricular response Abdomen: Soft.  No tenderness.  No organomegaly.  Positive  bowel sounds Extremities: Warm, dependent edema.  Scattered areas of ecchymosis.  Strong pulses. Neuro: Left upper extremity is contracted left lower extremity with immobilization as well.  Moving right side spontaneously awake, nonverbal,  will moan only GU: Clear yellow urine  Resolved Hospital Problem list   Klebsiella urinary tract infection, completed 5 days of ceftriaxone  Assessment & Plan:   Acute hypoxic respiratory failure.  Suspect aspiration pneumonia Plan Stat portable chest x-ray Arterial blood gas N.p.o. 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, IVH July 2019, Dementia  -Ammonia 30 -CT head Negative for hemorrhage, was rechecked given IV heparin Plan Hold any sedating medications Empirically treat for infection Supportive care  Atrial fibrillation with rapid ventricular response Plan Continuing IV heparin Continue telemetry monitoring We will place a as needed order for Lopressor, reluctant to start rate control scheduled medications IV hydration, she appears volume depleted to me Her QTC was over 500, will avoid amiodarone  History of coronary artery disease, grade 2 diastolic dysfunction and prior Coronary artery bypass grafting, hyperlipidemia and hypertension Plan Continue telemetry monitoring  AKI: This it initially improved, now worse again.   Plan  IV hydration  Allow for mild positive volume status   Fluid and electrolyte imbalance: Hypokalemia, mild hyperchloremia, hypomagnesemia non-anion gap metabolic acidosis plan  Replace potassium, and magnesium LR bolus then change IV fluid to D5 lactated Ringer at 75 Follow-up a.m. chemistry  Mild lactic acidosis Plan Fluid challenge, then repeat lactic acid  Anemia of critical illness No evidence of bleeding Plan Follow-up CBC   Dysphagia  Plan  N.p.o. for now  Protein calorie malnutrition Plan If mental status does not improve we will need to initiate tube  feeding  Pressure injuries left heel and left elbow -Wound Care Following   Disposition / Summary of Today's Plan 06/22/18   Now in the intensive care.  Working diagnosis is acute encephalopathy primarily metabolic in origin likely secondary to aspiration event versus hcap.  We will add vancomycin, provide gentle IV hydration, repeat lactic acid, and continue supportive care.  I will ask the nursing staff to reach out to family, we need to sit down and have goals of care discussion.    Diet: SLP evaluation pending, currently n.p.o. Pain/Anxiety/Delirium protocol (if indicated): n/a, stopping benzodiazepines  VAP protocol (if indicated): n/a DVT prophylaxis: full dose heparin GI prophylaxis: Pepcid Hyperglycemia protocol:  Mobility: bed rest Code Status: Full Family Communication: Son updated on 10/9  Labs   CBC: Recent Labs  Lab 06/17/18 1515 06/18/18 0726 06/19/18 0336 06/20/18 0251 06/21/18 0635 06/22/18 0342  WBC 6.3 6.1 6.7 10.6* 6.8 5.5  NEUTROABS 4.6  --   --   --   --   --   HGB 10.2* 9.4* 9.1* 9.3* 8.7* 8.0*  HCT 32.3* 31.9* 29.2* 30.3* 28.7* 25.7*  MCV 99.1 100.6* 98.0 98.4 100.3* 98.5  PLT 253 242 258 393 238 226    Basic Metabolic Panel: Recent Labs  Lab 06/18/18 0308 06/18/18 1525 06/19/18 0336 06/20/18 0251 06/21/18 0635 06/22/18 0342  NA 137  --  138 139 139 141  K 3.9  --  3.5 4.1 3.7 3.1*  CL 106  --  108 108 108 112*  CO2 22  --  23  22 23 21*  GLUCOSE 145*  --  89 101* 79 99  BUN 18  --  12 19 15 13   CREATININE 1.34*  --  1.00 1.32* 1.12* 1.18*  CALCIUM 7.2*  --  7.5* 8.3* 8.2* 7.8*  MG 0.9* 3.1* 2.5*  --   --  1.3*  PHOS 3.7  --   --   --   --   --    GFR: Estimated Creatinine Clearance: 48.3 mL/min (A) (by C-G formula based on SCr of 1.18 mg/dL (H)). Recent Labs  Lab 06/17/18 1706 06/18/18 0308 06/18/18 0726 06/19/18 0336 06/20/18 0251 06/21/18 0635 06/22/18 0342  PROCALCITON  --  <0.10  --   --   --   --   --   WBC  --   --  6.1  6.7 10.6* 6.8 5.5  LATICACIDVEN 2.34*  --  2.1*  --   --   --  3.1*    Liver Function Tests: Recent Labs  Lab 06/17/18 1515 06/20/18 0251 06/22/18 0342  AST 19 22 20   ALT 9 9 8   ALKPHOS 85 71 56  BILITOT 0.6 0.3 0.5  PROT 5.8* 5.9* 5.2*  ALBUMIN 2.5* 2.7* 2.5*   No results for input(s): LIPASE, AMYLASE in the last 168 hours. Recent Labs  Lab 06/22/18 0342  AMMONIA 30    ABG    Component Value Date/Time   PHART 7.390 06/21/2018 2045   PCO2ART 39.0 06/21/2018 2045   PO2ART PENDING 06/21/2018 2045   HCO3 23.3 06/21/2018 2045   TCO2 26 06/17/2018 1551   ACIDBASEDEF 1.1 06/21/2018 2045   O2SAT 50.9 06/21/2018 2045     Coagulation Profile: No results for input(s): INR, PROTIME in the last 168 hours.  Cardiac Enzymes: No results for input(s): CKTOTAL, CKMB, CKMBINDEX, TROPONINI in the last 168 hours.  HbA1C: Hgb A1c MFr Bld  Date/Time Value Ref Range Status  03/21/2018 06:50 AM 5.1 4.8 - 5.6 % Final    Comment:    (NOTE) Pre diabetes:          5.7%-6.4% Diabetes:              >6.4% Glycemic control for   <7.0% adults with diabetes   02/11/2011 06:00 AM  <5.7 % Final   5.4 (NOTE)                                                                       According to the ADA Clinical Practice Recommendations for 2011, when HbA1c is used as a screening test:   >=6.5%   Diagnostic of Diabetes Mellitus           (if abnormal result  is confirmed)  5.7-6.4%   Increased risk of developing Diabetes Mellitus  References:Diagnosis and Classification of Diabetes Mellitus,Diabetes Care,2011,34(Suppl 1):S62-S69 and Standards of Medical Care in         Diabetes - 2011,Diabetes Care,2011,34  (Suppl 1):S11-S61.    CBG: No results for input(s): GLUCAP in the last 168 hours.   My critical care x38 minutes   Simonne Martinet ACNP-BC [facility] Pulmonary/Critical Care Pager # [phone] OR # [phone] if no answer

## 2018-06-22 NOTE — Progress Notes (Deleted)
Pt's daughter Geraldine Contras present during wound change post bath this shift.  Approximately 0330 Geraldine Contras became verbally abusive towards Editor, commissioning, stating she made it a point to mark a heparin injection site one week ago today, and Art gallery manager of not bathing pt as she claims it is the same dressing.  Annabell Sabal, RN and [employer], [place] at bedside to witness daughter's agitation.  Handoff communication given to Lincoln Brigham, RN at 0400 per Annabell Sabal, Consulting civil engineer.

## 2018-06-22 NOTE — Progress Notes (Signed)
[place] for Heparin Indication: pulmonary embolus  Allergies  Allergen Reactions  . Avelox [Moxifloxacin Hcl In Nacl] Other (See Comments)    Mental breakdown  . Amoxicillin Hives  . Pamelor [Nortriptyline Hcl] Other (See Comments)    Made her want to hurt herself  . Penicillins Hives     Has patient had a PCN reaction causing immediate rash, facial/tongue/throat swelling, SOB or lightheadedness with hypotension: Yes Has patient had a PCN reaction causing severe rash involving mucus membranes or skin necrosis: Unk Has patient had a PCN reaction that required hospitalization: Unk Has patient had a PCN reaction occurring within the last 10 years: Unk If all of the above answers are "NO", then may proceed with Cephalosporin use.   . Other Itching    Any med that ends in -caine  . Hydrocodone Itching  . Oxycodone Itching  . Sulfa Antibiotics Other (See Comments)    "Allergic," per [facility]    Patient Measurements: Height: '5\' 3"'  (160 cm) Weight: 151 lb 7.3 oz (68.7 kg) IBW/kg (Calculated) : 52.4 Heparin Dosing Weight: 68 kg  Vital Signs: Temp: 97.4 F (36.3 C) (10/14 1929) Temp Source: Oral (10/14 1929) BP: 96/72 (10/14 2100) Pulse Rate: 93 (10/14 1800)  Labs: Recent Labs    06/20/18 0251  06/21/18 0635 06/22/18 0342 06/22/18 1218 06/22/18 2110  HGB 9.3*  --  8.7* 8.0*  --   --   HCT 30.3*  --  28.7* 25.7*  --   --   PLT 393  --  238 226  --   --   HEPARINUNFRC 0.17*   < > 0.34 0.20* 0.34 0.48  CREATININE 1.32*  --  1.12* 1.18*  --   --    < > = values in this interval not displayed.    Estimated Creatinine Clearance: 47.1 mL/min (A) (by C-G formula based on SCr of 1.18 mg/dL (H)).   Medications:  Scheduled:  . budesonide (PULMICORT) nebulizer solution  0.5 mg Nebulization BID  . chlorhexidine gluconate (MEDLINE KIT)  15 mL Mouth Rinse BID  . mouth rinse  15 mL Mouth Rinse BID  . mouth rinse  15 mL Mouth Rinse 10 times per  day  . pantoprazole sodium  40 mg Per Tube Q1200  . povidone-iodine   Topical BID    Assessment: 62 yo F presents with SOB. CT angio shows acute PE with heart strain. No anticoag PTA.   Heparin level therapeutic this evening  Goal of Therapy:  Heparin level 0.3-0.7 units/ml Monitor platelets by anticoagulation protocol: Yes   Plan:  Continue heparin at 750 units / hr Follow up AM labs  Thank you Anette Guarneri, PharmD

## 2018-06-22 NOTE — Progress Notes (Signed)
Increased RR from 16 to 18 based on ABG. MD made aware

## 2018-06-23 ENCOUNTER — Inpatient Hospital Stay: Payer: Self-pay

## 2018-06-23 ENCOUNTER — Inpatient Hospital Stay (HOSPITAL_COMMUNITY): Payer: PPO

## 2018-06-23 DIAGNOSIS — R6521 Severe sepsis with septic shock: Secondary | ICD-10-CM

## 2018-06-23 DIAGNOSIS — I2699 Other pulmonary embolism without acute cor pulmonale: Secondary | ICD-10-CM

## 2018-06-23 DIAGNOSIS — A419 Sepsis, unspecified organism: Principal | ICD-10-CM

## 2018-06-23 LAB — CULTURE, BLOOD (ROUTINE X 2)
CULTURE: NO GROWTH
Culture: NO GROWTH
Special Requests: ADEQUATE

## 2018-06-23 LAB — BLOOD CULTURE ID PANEL (REFLEXED)
Acinetobacter baumannii: NOT DETECTED
CANDIDA ALBICANS: NOT DETECTED
Candida glabrata: NOT DETECTED
Candida krusei: NOT DETECTED
Candida parapsilosis: NOT DETECTED
Candida tropicalis: NOT DETECTED
Carbapenem resistance: NOT DETECTED
ENTEROBACTER CLOACAE COMPLEX: NOT DETECTED
ESCHERICHIA COLI: NOT DETECTED
Enterobacteriaceae species: NOT DETECTED
Enterococcus species: NOT DETECTED
HAEMOPHILUS INFLUENZAE: NOT DETECTED
Klebsiella oxytoca: NOT DETECTED
Klebsiella pneumoniae: NOT DETECTED
LISTERIA MONOCYTOGENES: NOT DETECTED
Methicillin resistance: DETECTED — AB
NEISSERIA MENINGITIDIS: NOT DETECTED
Proteus species: NOT DETECTED
Pseudomonas aeruginosa: NOT DETECTED
STAPHYLOCOCCUS AUREUS BCID: NOT DETECTED
STAPHYLOCOCCUS SPECIES: DETECTED — AB
STREPTOCOCCUS PYOGENES: NOT DETECTED
Serratia marcescens: NOT DETECTED
Streptococcus agalactiae: NOT DETECTED
Streptococcus pneumoniae: NOT DETECTED
Streptococcus species: NOT DETECTED
Vancomycin resistance: NOT DETECTED

## 2018-06-23 LAB — HEPARIN LEVEL (UNFRACTIONATED): Heparin Unfractionated: 0.48 [IU]/mL (ref 0.30–0.70)

## 2018-06-23 LAB — CBC
HCT: 25.7 % — ABNORMAL LOW (ref 36.0–46.0)
Hemoglobin: 7.7 g/dL — ABNORMAL LOW (ref 12.0–15.0)
MCH: 29.7 pg (ref 26.0–34.0)
MCHC: 30 g/dL (ref 30.0–36.0)
MCV: 99.2 fL (ref 80.0–100.0)
Platelets: 235 10*3/uL (ref 150–400)
RBC: 2.59 MIL/uL — ABNORMAL LOW (ref 3.87–5.11)
RDW: 15.2 % (ref 11.5–15.5)
WBC: 8.3 10*3/uL (ref 4.0–10.5)
nRBC: 0 % (ref 0.0–0.2)

## 2018-06-23 LAB — HEPATIC FUNCTION PANEL
ALBUMIN: 2.2 g/dL — AB (ref 3.5–5.0)
ALT: 8 U/L (ref 0–44)
AST: 18 U/L (ref 15–41)
Alkaline Phosphatase: 51 U/L (ref 38–126)
BILIRUBIN DIRECT: 0.1 mg/dL (ref 0.0–0.2)
Indirect Bilirubin: 0.5 mg/dL (ref 0.3–0.9)
TOTAL PROTEIN: 4.5 g/dL — AB (ref 6.5–8.1)
Total Bilirubin: 0.6 mg/dL (ref 0.3–1.2)

## 2018-06-23 LAB — BASIC METABOLIC PANEL WITH GFR
Anion gap: 8 (ref 5–15)
BUN: 10 mg/dL (ref 6–20)
CO2: 20 mmol/L — ABNORMAL LOW (ref 22–32)
Calcium: 7.3 mg/dL — ABNORMAL LOW (ref 8.9–10.3)
Chloride: 110 mmol/L (ref 98–111)
Creatinine, Ser: 1.1 mg/dL — ABNORMAL HIGH (ref 0.44–1.00)
GFR calc Af Amer: >60 mL/min
GFR calc non Af Amer: 53 mL/min — ABNORMAL LOW
Glucose, Bld: 90 mg/dL (ref 70–99)
Potassium: 2.9 mmol/L — ABNORMAL LOW (ref 3.5–5.1)
Sodium: 138 mmol/L (ref 135–145)

## 2018-06-23 LAB — PHOSPHORUS: Phosphorus: 1.5 mg/dL — ABNORMAL LOW (ref 2.5–4.6)

## 2018-06-23 LAB — GLUCOSE, CAPILLARY
Glucose-Capillary: 113 mg/dL — ABNORMAL HIGH (ref 70–99)
Glucose-Capillary: 102 mg/dL — ABNORMAL HIGH (ref 70–99)
Glucose-Capillary: 86 mg/dL (ref 70–99)

## 2018-06-23 LAB — MAGNESIUM: Magnesium: 1.3 mg/dL — ABNORMAL LOW (ref 1.7–2.4)

## 2018-06-23 MED ORDER — FUROSEMIDE 10 MG/ML IJ SOLN
20.0000 mg | Freq: Once | INTRAMUSCULAR | Status: AC
  Administered 2018-06-23: 20 mg via INTRAVENOUS
  Filled 2018-06-23: qty 2

## 2018-06-23 MED ORDER — VITAL HIGH PROTEIN PO LIQD
1000.0000 mL | ORAL | Status: DC
  Administered 2018-06-23: 1000 mL

## 2018-06-23 MED ORDER — POTASSIUM PHOSPHATES 15 MMOLE/5ML IV SOLN
30.0000 mmol | Freq: Once | INTRAVENOUS | Status: AC
  Administered 2018-06-23: 30 mmol via INTRAVENOUS
  Filled 2018-06-23: qty 10

## 2018-06-23 MED ORDER — VITAL AF 1.2 CAL PO LIQD
1000.0000 mL | ORAL | Status: DC
  Administered 2018-06-23 – 2018-06-25 (×3): 1000 mL

## 2018-06-23 MED ORDER — PRO-STAT SUGAR FREE PO LIQD
30.0000 mL | Freq: Every day | ORAL | Status: DC
  Administered 2018-06-23 – 2018-06-28 (×6): 30 mL
  Filled 2018-06-23 (×6): qty 30

## 2018-06-23 MED ORDER — POTASSIUM CHLORIDE 20 MEQ/15ML (10%) PO SOLN
40.0000 meq | Freq: Four times a day (QID) | ORAL | Status: AC
  Administered 2018-06-23 (×2): 40 meq
  Filled 2018-06-23 (×2): qty 30

## 2018-06-23 MED ORDER — MAGNESIUM SULFATE 4 GM/100ML IV SOLN
4.0000 g | Freq: Once | INTRAVENOUS | Status: AC
  Administered 2018-06-23: 4 g via INTRAVENOUS
  Filled 2018-06-23: qty 100

## 2018-06-23 NOTE — Progress Notes (Signed)
Initial Nutrition Assessment  DOCUMENTATION CODES:   Non-severe (moderate) malnutrition in context of chronic illness  INTERVENTION:    Vital AF 1.2 at 20 ml/h. Recommend increase by 10 ml every 8 hours to goal rate of 45 ml/h (1080 ml per day)  Pro-stat 30 ml once daily  Provides 1396 kcal, 96 gm protein, 876 ml free water daily  Monitor magnesium, potassium, and phosphorus daily for at least 3 days, MD to replete as needed, as pt is at risk for refeeding syndrome given moderate PCM and low potassium, phosphorus, and magnesium.  NUTRITION DIAGNOSIS:   Moderate Malnutrition related to chronic illness(dementia, COPD, CKD stage III) as evidenced by mild fat depletion, mild muscle depletion, percent weight loss(12.5% weight loss in 3 months).  Ongoing  GOAL:   Patient will meet greater than or equal to 90% of their needs  Being addressed with TF initiation  MONITOR:   Vent status, TF tolerance, Weight trends, Labs, Skin  REASON FOR ASSESSMENT:   Ventilator, Consult Enteral/tube feeding initiation and management  ASSESSMENT:   62 year old female who presented to the ED on 10/9 from [facility] with cough and SOB. Pt admitted with PE. PMH significant for dementia, CAD, hypertension, hyperlipidemia, COPD, CKD stage III, hypothyroidism, stroke, depression, atrial fibrillation, and GERD.  Patient required emergent intubation and transfer to the ICU on 10/14 due to presumed aspiration. Aspiration PNA suspected. Received MD Consult for TF initiation and management. OGT in place.  Patient is currently intubated on ventilator support MV: 7.4 L/min Temp (24hrs), Avg:97.4 F (36.3 C), Min:97.2 F (36.2 C), Max:97.7 F (36.5 C)   Labs reviewed. Potassium 2.9 (L), phosphorus 1.5 (L), magnesium 1.3 (L) Medications reviewed and include precedex, Flagyl, neosynephrine, KCl, potassium phosphate.   Diet Order:   Diet Order            Diet NPO time specified  Diet effective  now              EDUCATION NEEDS:   No education needs have been identified at this time  Skin:  Skin Assessment: Skin Integrity Issues: Skin Integrity Issues:: DTI, Unstageable DTI: L elbow Unstageable: L ankle  Last BM:  10/11  Height:   Ht Readings from Last 1 Encounters:  06/22/18 5\' 3"  (1.6 m)    Weight:   Wt Readings from Last 1 Encounters:  06/23/18 68.5 kg    Ideal Body Weight:  54.55 kg  BMI:  Body mass index is 26.75 kg/m.  Estimated Nutritional Needs:   Kcal:  1415  Protein:  95-110 gm  Fluid:  1.7-1.9 L    Joaquin Courts, RD, LDN, CNSC Pager [phone] After Hours Pager [phone]

## 2018-06-23 NOTE — Progress Notes (Signed)
Arrived to discuss PICC placement with patient/ and or family. Patient unable to sign. Unable to reach family member for consent. Primary RN notified.

## 2018-06-23 NOTE — Progress Notes (Signed)
[place] for Heparin Indication: pulmonary embolus  Allergies  Allergen Reactions  . Avelox [Moxifloxacin Hcl In Nacl] Other (See Comments)    Mental breakdown  . Amoxicillin Hives  . Pamelor [Nortriptyline Hcl] Other (See Comments)    Made her want to hurt herself  . Penicillins Hives     Has patient had a PCN reaction causing immediate rash, facial/tongue/throat swelling, SOB or lightheadedness with hypotension: Yes Has patient had a PCN reaction causing severe rash involving mucus membranes or skin necrosis: Unk Has patient had a PCN reaction that required hospitalization: Unk Has patient had a PCN reaction occurring within the last 10 years: Unk If all of the above answers are "NO", then may proceed with Cephalosporin use.   . Other Itching    Any med that ends in -caine  . Hydrocodone Itching  . Oxycodone Itching  . Sulfa Antibiotics Other (See Comments)    "Allergic," per [facility]    Patient Measurements: Height: _0  (160 cm) Weight: 151 lb 0.2 oz (68.5 kg) IBW/kg (Calculated) : 52.4 Heparin Dosing Weight: 68 kg  Vital Signs: Temp: 97.2 F (36.2 C) (10/15 0419) Temp Source: Oral (10/15 0419) BP: 98/81 (10/15 0900) Pulse Rate: 75 (10/15 0815)  Labs: Recent Labs    06/21/18 0635 06/22/18 0342 06/22/18 1218 06/22/18 2110 06/23/18 0007 06/23/18 0301  HGB 8.7* 8.0*  --   --  7.7*  --   HCT 28.7* 25.7*  --   --  25.7*  --   PLT 238 226  --   --  235  --   HEPARINUNFRC 0.34 0.20* 0.34 0.48 0.48  --   CREATININE 1.12* 1.18*  --   --   --  1.10*    Estimated Creatinine Clearance: 50.5 mL/min (A) (by C-G formula based on SCr of 1.1 mg/dL (H)).   Medications:  Scheduled:  . budesonide (PULMICORT) nebulizer solution  0.5 mg Nebulization BID  . chlorhexidine gluconate (MEDLINE KIT)  15 mL Mouth Rinse BID  . mouth rinse  15 mL Mouth Rinse 10 times per day  . pantoprazole sodium  40 mg Per Tube Q1200  . povidone-iodine    Topical BID    Assessment: 62 yo F presents with SOB. CT angio shows acute PE with heart strain. No anticoag PTA. Heparin rate was increased on 10/14 due to patient's preliminary bilateral lower extremity venous duplex reporting "highly mobile DVT noted in the proximal left femoral vein." Patient's heparin level this AM remains therapeutic at 0.48.    Hemoglobin has trended down from 8.0 to 7.7, but pltc remains stable. No new issues reported with heparin gtt per nurse.   Goal of Therapy:  Heparin level 0.3-0.7 units/ml Monitor platelets by anticoagulation protocol: Yes   Plan:  Continue heparin at 750 units/hr  Daily heparin level, CBC Monitor closely for signs/symptoms of bleeding  Thank you for allowing pharmacy to be a part of this patient's care.  Leron Croak, PharmD PGY1 Pharmacy Resident Phone: [phone]  Please check AMION for all [facility] phone numbers

## 2018-06-23 NOTE — Progress Notes (Signed)
NAME:  [name], MRN:  [id number], DOB:  12-28-1955, LOS: 6 ADMISSION DATE:  06/17/2018, CONSULTATION DATE:  06/17/2018 REFERRING MD:  Dr. Lockie Mola, CHIEF COMPLAINT:  PE   Brief History   62 year old female hx CVA, COPD, who resides in SNF after recent ICH / IVH with L sided weakness. Adm 10/9  with hypoxia and PE. Started on heparin infusion.   10/14 AM patient became progressively lethargic with tachypnea and hypoxia. 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 10/14: Critical care reconsulted for respiratory distress  Consults: date of consult/date signed off & final recs:  PCCM 10/9 > Neurology 10/9 >  Procedures (surgical and bedside):  ETT10/14 >>  Significant Diagnostic Tests:  10/9 CTA PE > Positive for acute PE primarily within the right pulmonary arterial system with CT evidence of right heart strain (RV/LV Ratio is greater than 1). Mild alveolar edema is noted particularly in the right upper lobe. Small effusions and bibasilar dependent atelectatic changes. Aortic Atherosclerosis and Emphysema  Head Ct 10/9 >> IVH resolved, chronic right MCA infarct, no acute abnormality Echocardiogram 10/10 > nml LVSF ,grade 2 diastolic dysfunction. .    Micro Data:  BCx 2 10/9 >>  ng UC 10/9 > Klebsiella Pneumoniae  Blood 10/14 >  resp 10/14 >>  Antimicrobials:  Ceftriaxone 10/9 > 10/13  Cefepime/Flagyl 10/14 >>  Subjective:   She is critically ill, intubated, on low-dose Neo-Synephrine. Agitated when Precedex started down Afebrile. Low urine  output  Objective   Blood pressure 98/81, pulse 75, temperature (!) 97.2 F (36.2 C), temperature source Oral, resp. rate 18, height 5\' 3"  (1.6 m), weight 68.5 kg, SpO2 100 %.    Vent Mode: PRVC FiO2 (%):  [40 %-100 %] 40 % Set Rate:  [16 bmp-18 bmp] 18 bmp Vt Set:  [410 mL] 410 mL PEEP:  [5 cmH20] 5 cmH20 Plateau Pressure:  [11 cmH20-16 cmH20] 15 cmH20   Intake/Output Summary (Last 24 hours) at 06/23/2018 1054 Last data filed at 06/23/2018 1000 Gross per 24 hour  Intake 6216.15 ml  Output 1512 ml  Net 4704.15 ml   Filed Weights   06/21/18 0415 06/22/18 0500 06/23/18 0500  Weight: 68.9 kg 68.7 kg 68.5 kg    Examination:  Elderly woman, orally intubated, sedated on Precedex, RA SS -3 Decreased breath sounds bilateral, S1-S2 irregular rate controlled. 1+ edema, no JVD Soft and nontender abdomen  Chest x-ray personally reviewed which shows increasing edema pattern and infiltrates  Resolved Hospital Problem list   Klebsiella urinary tract infection, completed 5 days of ceftriaxone  Assessment & Plan:   Acute hypoxic respiratory failure.  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   Plan  IV hydration  Allow for mild positive volume status   Fluid and electrolyte imbalance: Hypokalemia, hypophosphatemia, mild hyperchloremia, hypomagnesemia non-anion gap metabolic acidosis plan  Replace potassium, magnesium and phosphate   Dysphagia  Protein calorie malnutrition Plan Start tube feeds  Pressure injuries left heel and left elbow -Wound Care Following   Disposition / Summary of Today's Plan 06/23/18   Acute encephalopathy primarily metabolic in origin likely secondary to aspiration event versus hcap.  There has been some renal insult, respiratory status has stabilized on the vent, not ready to start weaning yet to fluid overload and agitation Replace electrolytes today, start tube feeds and place PICC line in anticipation of longer hospital stay    Code Status: Full Family Communication: Multiple attempts to reach son  Labs   CBC: Recent Labs  Lab 06/17/18 1515  06/19/18 0336 06/20/18 0251 06/21/18 0635 06/22/18 0342 06/23/18 0007  WBC 6.3   < > 6.7 10.6* 6.8 5.5 8.3  NEUTROABS 4.6  --   --   --   --   --   --   HGB 10.2*   < > 9.1* 9.3* 8.7* 8.0* 7.7*  HCT 32.3*   < > 29.2* 30.3* 28.7* 25.7* 25.7*  MCV 99.1   < > 98.0 98.4 100.3* 98.5 99.2  PLT 253   < > 258 393 238 226 235   < > = values in this interval not displayed.    Basic Metabolic Panel: Recent Labs  Lab 06/18/18 0308 06/18/18 1525 06/19/18 0336 06/20/18 0251 06/21/18 0635 06/22/18 0342 06/23/18 0301  NA 137  --  138 139 139 141 138  K 3.9  --  3.5 4.1 3.7 3.1* 2.9*  CL 106  --  108 108 108 112* 110  CO2 22  --  23 22 23  21* 20*  GLUCOSE 145*  --  89 101* 79 99 90  BUN 18  --  12 19 15 13 10   CREATININE 1.34*  --  1.00 1.32* 1.12* 1.18* 1.10*  CALCIUM 7.2*  --  7.5* 8.3* 8.2* 7.8* 7.3*  MG 0.9* 3.1* 2.5*  --   --  1.3* 1.3*  PHOS 3.7  --   --   --   --   --  1.5*   GFR: Estimated Creatinine Clearance: 50.5 mL/min (A) (by C-G formula  based on SCr of 1.1 mg/dL (H)). Recent Labs  Lab 06/17/18 1706 06/18/18 0308 06/18/18 0726  06/20/18 0251 06/21/18 1610 06/22/18 0342 06/22/18 1218 06/23/18 0007  PROCALCITON  --  <0.10  --   --   --   --   --   --   --   WBC  --   --  6.1   < > 10.6* 6.8 5.5  --  8.3  LATICACIDVEN 2.34*  --  2.1*  --   --   --  3.1* 4.2*  --    < > = values in this interval not displayed.    Liver Function Tests: Recent Labs  Lab 06/17/18 1515 06/20/18 0251 06/22/18 0342 06/23/18 0301  AST 19 22 20 18   ALT 9 9 8 8   ALKPHOS 85 71  56 51  BILITOT 0.6 0.3 0.5 0.6  PROT 5.8* 5.9* 5.2* 4.5*  ALBUMIN 2.5* 2.7* 2.5* 2.2*   No results for input(s): LIPASE, AMYLASE in the last 168 hours. Recent Labs  Lab 06/22/18 0342  AMMONIA 30    ABG    Component Value Date/Time   PHART 7.265 (L) 06/22/2018 1237   PCO2ART 45.3 06/22/2018 1237   PO2ART 345.0 (H) 06/22/2018 1237   HCO3 20.7 06/22/2018 1237   TCO2 22 06/22/2018 1237   ACIDBASEDEF 6.0 (H) 06/22/2018 1237   O2SAT 100.0 06/22/2018 1237     Coagulation Profile: No results for input(s): INR, PROTIME in the last 168 hours.  Cardiac Enzymes: No results for input(s): CKTOTAL, CKMB, CKMBINDEX, TROPONINI in the last 168 hours.  HbA1C: Hgb A1c MFr Bld  Date/Time Value Ref Range Status  03/21/2018 06:50 AM 5.1 4.8 - 5.6 % Final    Comment:    (NOTE) Pre diabetes:          5.7%-6.4% Diabetes:              >6.4% Glycemic control for   <7.0% adults with diabetes   02/11/2011 06:00 AM  <5.7 % Final   5.4 (NOTE)                                                                       According to the ADA Clinical Practice Recommendations for 2011, when HbA1c is used as a screening test:   >=6.5%   Diagnostic of Diabetes Mellitus           (if abnormal result  is confirmed)  5.7-6.4%   Increased risk of developing Diabetes Mellitus  References:Diagnosis and Classification of Diabetes Mellitus,Diabetes Care,2011,34(Suppl 1):S62-S69 and  Standards of Medical Care in         Diabetes - 2011,Diabetes Care,2011,34  (Suppl 1):S11-S61.    CBG: Recent Labs  Lab 06/22/18 0426  GLUCAP 88     My critical care time x 28m  Cyril Mourning MD. FCCP. [facility] Pager [phone] If no response call [phone]   06/23/2018

## 2018-06-23 NOTE — Progress Notes (Signed)
PHARMACY - PHYSICIAN COMMUNICATION CRITICAL VALUE ALERT - BLOOD CULTURE IDENTIFICATION (BCID)  [name] is an 62 y.o. female who presented to [facility] on 06/17/2018 with a chief complaint of pneumonia  Assessment:  GPC (1/4 bottles)  Current antibiotics: Vancomycin / Cefepime  Changes to prescribed antibiotics recommended:  Patient is on recommended antibiotics - No changes needed, Likely contaminant  Results for orders placed or performed during the hospital encounter of 06/17/18  Blood Culture ID Panel (Reflexed) (Collected: 06/22/2018  9:00 AM)  Result Value Ref Range   Enterococcus species NOT DETECTED NOT DETECTED   Vancomycin resistance NOT DETECTED NOT DETECTED   Listeria monocytogenes NOT DETECTED NOT DETECTED   Staphylococcus species DETECTED (A) NOT DETECTED   Staphylococcus aureus (BCID) NOT DETECTED NOT DETECTED   Methicillin resistance DETECTED (A) NOT DETECTED   Streptococcus species NOT DETECTED NOT DETECTED   Streptococcus agalactiae NOT DETECTED NOT DETECTED   Streptococcus pneumoniae NOT DETECTED NOT DETECTED   Streptococcus pyogenes NOT DETECTED NOT DETECTED   Acinetobacter baumannii NOT DETECTED NOT DETECTED   Enterobacteriaceae species NOT DETECTED NOT DETECTED   Enterobacter cloacae complex NOT DETECTED NOT DETECTED   Escherichia coli NOT DETECTED NOT DETECTED   Klebsiella oxytoca NOT DETECTED NOT DETECTED   Klebsiella pneumoniae NOT DETECTED NOT DETECTED   Proteus species NOT DETECTED NOT DETECTED   Serratia marcescens NOT DETECTED NOT DETECTED   Carbapenem resistance NOT DETECTED NOT DETECTED   Haemophilus influenzae NOT DETECTED NOT DETECTED   Neisseria meningitidis NOT DETECTED NOT DETECTED   Pseudomonas aeruginosa NOT DETECTED NOT DETECTED   Candida albicans NOT DETECTED NOT DETECTED   Candida glabrata NOT DETECTED NOT DETECTED   Candida krusei NOT DETECTED NOT DETECTED   Candida parapsilosis NOT DETECTED NOT DETECTED   Candida tropicalis  NOT DETECTED NOT DETECTED    Elwin Sleight 06/23/2018  8:57 PM

## 2018-06-24 ENCOUNTER — Inpatient Hospital Stay (HOSPITAL_COMMUNITY): Payer: PPO

## 2018-06-24 LAB — VANCOMYCIN, TROUGH: VANCOMYCIN TR: 29 ug/mL — AB (ref 15–20)

## 2018-06-24 LAB — BASIC METABOLIC PANEL
ANION GAP: 10 (ref 5–15)
BUN: 8 mg/dL (ref 6–20)
CO2: 22 mmol/L (ref 22–32)
Calcium: 7.3 mg/dL — ABNORMAL LOW (ref 8.9–10.3)
Chloride: 104 mmol/L (ref 98–111)
Creatinine, Ser: 0.99 mg/dL (ref 0.44–1.00)
Glucose, Bld: 95 mg/dL (ref 70–99)
POTASSIUM: 3.1 mmol/L — AB (ref 3.5–5.1)
SODIUM: 136 mmol/L (ref 135–145)

## 2018-06-24 LAB — CBC
HEMATOCRIT: 28.2 % — AB (ref 36.0–46.0)
Hemoglobin: 8.9 g/dL — ABNORMAL LOW (ref 12.0–15.0)
MCH: 30.8 pg (ref 26.0–34.0)
MCHC: 31.6 g/dL (ref 30.0–36.0)
MCV: 97.6 fL (ref 80.0–100.0)
PLATELETS: 220 10*3/uL (ref 150–400)
RBC: 2.89 MIL/uL — AB (ref 3.87–5.11)
RDW: 15.3 % (ref 11.5–15.5)
WBC: 9.5 10*3/uL (ref 4.0–10.5)
nRBC: 0 % (ref 0.0–0.2)

## 2018-06-24 LAB — PHOSPHORUS: Phosphorus: 3.2 mg/dL (ref 2.5–4.6)

## 2018-06-24 LAB — GLUCOSE, CAPILLARY
GLUCOSE-CAPILLARY: 93 mg/dL (ref 70–99)
Glucose-Capillary: 101 mg/dL — ABNORMAL HIGH (ref 70–99)
Glucose-Capillary: 105 mg/dL — ABNORMAL HIGH (ref 70–99)
Glucose-Capillary: 84 mg/dL (ref 70–99)
Glucose-Capillary: 87 mg/dL (ref 70–99)
Glucose-Capillary: 89 mg/dL (ref 70–99)
Glucose-Capillary: 94 mg/dL (ref 70–99)

## 2018-06-24 LAB — HEPARIN LEVEL (UNFRACTIONATED): Heparin Unfractionated: 0.55 IU/mL (ref 0.30–0.70)

## 2018-06-24 LAB — MAGNESIUM: MAGNESIUM: 1.9 mg/dL (ref 1.7–2.4)

## 2018-06-24 MED ORDER — SODIUM CHLORIDE 0.9% FLUSH
10.0000 mL | INTRAVENOUS | Status: DC | PRN
  Administered 2018-06-27 – 2018-06-30 (×2): 10 mL
  Filled 2018-06-24 (×2): qty 40

## 2018-06-24 MED ORDER — VANCOMYCIN HCL IN DEXTROSE 750-5 MG/150ML-% IV SOLN
750.0000 mg | INTRAVENOUS | Status: DC
  Administered 2018-06-25: 750 mg via INTRAVENOUS
  Filled 2018-06-24: qty 150

## 2018-06-24 MED ORDER — FUROSEMIDE 10 MG/ML IJ SOLN
20.0000 mg | Freq: Two times a day (BID) | INTRAMUSCULAR | Status: DC
  Administered 2018-06-24 – 2018-06-30 (×13): 20 mg via INTRAVENOUS
  Filled 2018-06-24 (×13): qty 2

## 2018-06-24 MED ORDER — SODIUM CHLORIDE 0.9 % IV SOLN
1.0000 g | Freq: Two times a day (BID) | INTRAVENOUS | Status: DC
  Administered 2018-06-24 – 2018-06-25 (×2): 1 g via INTRAVENOUS
  Filled 2018-06-24 (×2): qty 1

## 2018-06-24 MED ORDER — POTASSIUM CHLORIDE 20 MEQ/15ML (10%) PO SOLN
30.0000 meq | ORAL | Status: AC
  Administered 2018-06-24 (×2): 30 meq
  Filled 2018-06-24 (×2): qty 30

## 2018-06-24 MED ORDER — SODIUM CHLORIDE 0.9 % IV SOLN
2.0000 g | Freq: Three times a day (TID) | INTRAVENOUS | Status: DC
  Filled 2018-06-24: qty 2

## 2018-06-24 MED ORDER — CHLORHEXIDINE GLUCONATE CLOTH 2 % EX PADS
6.0000 | MEDICATED_PAD | Freq: Every day | CUTANEOUS | Status: DC
  Administered 2018-06-24 – 2018-07-06 (×14): 6 via TOPICAL

## 2018-06-24 NOTE — Progress Notes (Signed)
[place] for Heparin Indication: pulmonary embolus  Allergies  Allergen Reactions  . Avelox [Moxifloxacin Hcl In Nacl] Other (See Comments)    Mental breakdown  . Amoxicillin Hives  . Pamelor [Nortriptyline Hcl] Other (See Comments)    Made her want to hurt herself  . Penicillins Hives     Has patient had a PCN reaction causing immediate rash, facial/tongue/throat swelling, SOB or lightheadedness with hypotension: Yes Has patient had a PCN reaction causing severe rash involving mucus membranes or skin necrosis: Unk Has patient had a PCN reaction that required hospitalization: Unk Has patient had a PCN reaction occurring within the last 10 years: Unk If all of the above answers are "NO", then may proceed with Cephalosporin use.   . Other Itching    Any med that ends in -caine  . Hydrocodone Itching  . Oxycodone Itching  . Sulfa Antibiotics Other (See Comments)    "Allergic," per [facility]    Patient Measurements: Height: _0  (160 cm) Weight: 152 lb 8.9 oz (69.2 kg) IBW/kg (Calculated) : 52.4 Heparin Dosing Weight: 68 kg  Vital Signs: Temp: 97.5 F (36.4 C) (10/16 0700) Temp Source: Oral (10/16 0700) BP: 113/85 (10/16 1000) Pulse Rate: 156 (10/16 0930)  Labs: Recent Labs    06/22/18 0342  06/22/18 2110 06/23/18 0007 06/23/18 0301 06/24/18 0314 06/24/18 0811  HGB 8.0*  --   --  7.7*  --   --  8.9*  HCT 25.7*  --   --  25.7*  --   --  28.2*  PLT 226  --   --  235  --   --  220  HEPARINUNFRC 0.20*   < > 0.48 0.48  --  0.55  --   CREATININE 1.18*  --   --   --  1.10* 0.99  --    < > = values in this interval not displayed.    Estimated Creatinine Clearance: 56.4 mL/min (by C-G formula based on SCr of 0.99 mg/dL).   Medications:  Scheduled:  . budesonide (PULMICORT) nebulizer solution  0.5 mg Nebulization BID  . chlorhexidine gluconate (MEDLINE KIT)  15 mL Mouth Rinse BID  . feeding supplement (PRO-STAT SUGAR FREE 64)  30 mL  Per Tube Daily  . feeding supplement (VITAL AF 1.2 CAL)  1,000 mL Per Tube Q24H  . furosemide  20 mg Intravenous Q12H  . mouth rinse  15 mL Mouth Rinse 10 times per day  . pantoprazole sodium  40 mg Per Tube Q1200  . povidone-iodine   Topical BID    Assessment: 62 yo F presents with SOB. CT angio shows acute PE with heart strain. No anticoag PTA. Heparin rate was increased on 10/14 due to patient's preliminary bilateral lower extremity venous duplex reporting "highly mobile DVT noted in the proximal left femoral vein." Patient's heparin level this AM remains therapeutic at 0.55.    Hemoglobin has improved 7.7 to 8.9, pltc remains stable. No new issues reported with heparin gtt per nurse.   Goal of Therapy:  Heparin level 0.3-0.7 units/ml Monitor platelets by anticoagulation protocol: Yes   Plan:  Continue heparin at 750 units/hr  Daily heparin level, CBC Monitor closely for signs/symptoms of bleeding  Thank you for allowing pharmacy to be a part of this patient's care.  Leron Croak, PharmD PGY1 Pharmacy Resident Phone: [phone]  Please check AMION for all [place] phone numbers

## 2018-06-24 NOTE — Progress Notes (Signed)
[facility] ADULT ICU REPLACEMENT PROTOCOL FOR AM LAB REPLACEMENT ONLY  The patient does apply for the [facility] Adult ICU Electrolyte Replacment Protocol based on the criteria listed below:   1. Is GFR >/= 40 ml/min? Yes.    Patient's GFR today is >60 2. Is urine output >/= 0.5 ml/kg/hr for the last 6 hours? Yes.   Patient's UOP is 1.4 ml/kg/hr 3. Is BUN < 60 mg/dL? Yes.    Patient's BUN today is 8 4. Abnormal electrolyte(s): K-3.1 5. Ordered repletion with: per protocol 6. If a panic level lab has been reported, has the CCM MD in charge been notified? Yes.  .   Physician:  Dr. Janne Lab, Dixon Boos 06/24/2018 5:53 AM

## 2018-06-24 NOTE — Progress Notes (Addendum)
62 year old female hx CVA, COPD, who resides in SNF after recent ICH / IVH with L sided weakness. Adm 10/9  with hypoxia and PE. Started on heparin infusion.   10/14 AM patient became progressively lethargic with tachypnea and hypoxia. Patient transferred back to ICU , intubated  >> presumed aspiration pneumonia  She remains critically ill, orally intubated with severe intermittent agitation in spite of max dose of Precedex.  She does not follow commands although I was able to get her to open her eyes.  She does wean and pulls good volumes on pressure support 10/5, secretions are minimal, decreased breath sounds bilateral, S1-S2 irregular, 1+ edema.  Labs reviewed which show hypokalemia. Chest x-ray reviewed which still shows edema pattern with bilateral small effusions and bibasilar infiltrates.  Impression/plan  Acute hypoxic respiratory failure-we will continue with spontaneous breathing trials Add Lasix 20 every 12.  Aspiration pneumonia-continue cefepime, continue vancomycin since she has MRSA 1/2 on blood culture but likely contaminant we will discontinue 24 hours, continue Flagyl for 5 days.  DVT/ PE - ct heparin  Acute metabolic encephalopathy-continue with Precedex and intermittent fentanyl Head CT confirm negative for bleed on heparin  Hypokalemia will be repleted, magnesium and phosphorus normal levels now.  Updated son [name] in detail.  He indicates that she would not want long-term life support.  Based on this it is reasonable to continue current life support measures but will not reintubate once we have optimized and extubated.  Okay to use low-dose pressors meantime, DNR will be issued.  My critical care time x 93m  Rakesh V. Vassie Loll MD

## 2018-06-24 NOTE — Progress Notes (Signed)
Peripherally Inserted Central Catheter/Midline Placement  The IV Nurse has discussed with the patient and/or persons authorized to consent for the patient, the purpose of this procedure and the potential benefits and risks involved with this procedure.  The benefits include less needle sticks, lab draws from the catheter, and the patient may be discharged home with the catheter. Risks include, but not limited to, infection, bleeding, blood clot (thrombus formation), and puncture of an artery; nerve damage and irregular heartbeat and possibility to perform a PICC exchange if needed/ordered by physician.  Alternatives to this procedure were also discussed.  Bard Power PICC patient education guide, fact sheet on infection prevention and patient information card has been provided to patient /or left at bedside.    PICC/Midline Placement Documentation  PICC Double Lumen 06/24/18 PICC Right Basilic 34 cm 0 cm (Active)  Indication for Insertion or Continuance of Line Prolonged intravenous therapies 06/24/2018 10:59 AM  Exposed Catheter (cm) 0 cm 06/24/2018 10:59 AM  Site Assessment Intact;Dry;Clean 06/24/2018 10:59 AM  Lumen #1 Status Flushed;Blood return noted;Saline locked 06/24/2018 10:59 AM  Lumen #2 Status Flushed;Blood return noted;Saline locked 06/24/2018 10:59 AM  Dressing Type Transparent 06/24/2018 10:59 AM  Dressing Status Clean;Dry;Intact 06/24/2018 10:59 AM  Dressing Change Due 07/01/18 06/24/2018 10:59 AM       Audrie Gallus 06/24/2018, 11:01 AM

## 2018-06-24 NOTE — Progress Notes (Signed)
Pharmacy Antibiotic Note  [name] is a 62 y.o. female admitted on 06/17/2018 with PE/UTI, now presenting with concerns of aspiration pneumonia. Patient is currently on D#3 of vancomycin, Cefepime and flagyl. WBC has normalized. SCr 0.99. Patient's trach aspirate grew out pseudomonas today.   Of note, a 12 hour Vanc trough was supratherapeutic at 29. Patient's serum creatinine is likely not reflective of antibiotic clearance   Plan: Stop flagyl per MD Decrease vancomycin to 750 mg IV Q 24 hours. Next dose 12 hours from now. Likely can d/c tomorrow if other cultures remain negative.  Will continue cefepime at 1 gm IV Q 12 hours for now    Height: 5\' 3"  (160 cm) Weight: 152 lb 8.9 oz (69.2 kg) IBW/kg (Calculated) : 52.4  Temp (24hrs), Avg:97.5 F (36.4 C), Min:97.4 F (36.3 C), Max:97.7 F (36.5 C)  Recent Labs  Lab 06/17/18 1706  06/18/18 0726  06/20/18 0251 06/21/18 6553 06/22/18 0342 06/22/18 1218 06/23/18 0007 06/23/18 0301 06/24/18 0314 06/24/18 0811 06/24/18 1116  WBC  --   --  6.1   < > 10.6* 6.8 5.5  --  8.3  --   --  9.5  --   CREATININE  --    < >  --    < > 1.32* 1.12* 1.18*  --   --  1.10* 0.99  --   --   LATICACIDVEN 2.34*  --  2.1*  --   --   --  3.1* 4.2*  --   --   --   --   --   VANCOTROUGH  --   --   --   --   --   --   --   --   --   --   --   --  29*   < > = values in this interval not displayed.    Estimated Creatinine Clearance: 56.4 mL/min (by C-G formula based on SCr of 0.99 mg/dL).    Allergies  Allergen Reactions  . Avelox [Moxifloxacin Hcl In Nacl] Other (See Comments)    Mental breakdown  . Amoxicillin Hives  . Pamelor [Nortriptyline Hcl] Other (See Comments)    Made her want to hurt herself  . Penicillins Hives     Has patient had a PCN reaction causing immediate rash, facial/tongue/throat swelling, SOB or lightheadedness with hypotension: Yes Has patient had a PCN reaction causing severe rash involving mucus membranes or skin  necrosis: Unk Has patient had a PCN reaction that required hospitalization: Unk Has patient had a PCN reaction occurring within the last 10 years: Unk If all of the above answers are "NO", then may proceed with Cephalosporin use.   . Other Itching    Any med that ends in -caine  . Hydrocodone Itching  . Oxycodone Itching  . Sulfa Antibiotics Other (See Comments)    "Allergic," per [facility]    Antimicrobials this admission: Vancomycin 10/14 >>  Cefepime 10/14 >> Flagyl 10/14 >> 10/16 Ceftriaxone 10/10 >> 10/14  Dose adjustments this admission: N/A  Microbiology results: 10/14 Trach aspirate - Moderate pseudomonas  10/14 BCx 1/2 Coag neg staph  10/9 MRSA PCR - negative 10/9 BCx - Neg 10/9 UCx - 100k K.pneumo (final)  Thank you for allowing pharmacy to be a part of this patient's care.  Vinnie Level, PharmD., BCPS Clinical Pharmacist Clinical phone for 06/24/18 until 3:30pm: [phone] If after 3:30pm, please refer to [facility] for unit-specific pharmacist

## 2018-06-24 NOTE — Progress Notes (Signed)
CSW spoke with Tammy from [facility] to give update on t. CSW aware that pt is now intubated back in the ICU. CSW aware that pt is on precedex. CSW informed by Tammy that if pt returns to the facility once medically stable that pt will need a new PASAR as current once has expired. CSW will continue to follow for appropriate level of care and further discharge needs.    Claude Manges Kymberlee Viger, MSW, LCSW-A Emergency Department Clinical Social Worker [phone]

## 2018-06-25 ENCOUNTER — Other Ambulatory Visit: Payer: Self-pay | Admitting: Licensed Clinical Social Worker

## 2018-06-25 ENCOUNTER — Inpatient Hospital Stay (HOSPITAL_COMMUNITY): Payer: PPO

## 2018-06-25 DIAGNOSIS — G9340 Encephalopathy, unspecified: Secondary | ICD-10-CM

## 2018-06-25 LAB — CULTURE, RESPIRATORY W GRAM STAIN

## 2018-06-25 LAB — CULTURE, RESPIRATORY: SPECIAL REQUESTS: NORMAL

## 2018-06-25 LAB — CULTURE, BLOOD (ROUTINE X 2): Special Requests: ADEQUATE

## 2018-06-25 LAB — BASIC METABOLIC PANEL
ANION GAP: 8 (ref 5–15)
BUN: 12 mg/dL (ref 6–20)
CHLORIDE: 100 mmol/L (ref 98–111)
CO2: 26 mmol/L (ref 22–32)
Calcium: 7.7 mg/dL — ABNORMAL LOW (ref 8.9–10.3)
Creatinine, Ser: 0.84 mg/dL (ref 0.44–1.00)
GFR calc Af Amer: >60 mL/min (ref >60)
GFR calc non Af Amer: >60 mL/min (ref >60)
GLUCOSE: 119 mg/dL — AB (ref 70–99)
Potassium: 3.3 mmol/L — ABNORMAL LOW (ref 3.5–5.1)
Sodium: 134 mmol/L — ABNORMAL LOW (ref 135–145)

## 2018-06-25 LAB — GLUCOSE, CAPILLARY
GLUCOSE-CAPILLARY: 84 mg/dL (ref 70–99)
GLUCOSE-CAPILLARY: 92 mg/dL (ref 70–99)
GLUCOSE-CAPILLARY: 104 mg/dL — AB (ref 70–99)
Glucose-Capillary: 100 mg/dL — ABNORMAL HIGH (ref 70–99)
Glucose-Capillary: 107 mg/dL — ABNORMAL HIGH (ref 70–99)
Glucose-Capillary: 88 mg/dL (ref 70–99)

## 2018-06-25 LAB — PHOSPHORUS: Phosphorus: 2.5 mg/dL (ref 2.5–4.6)

## 2018-06-25 LAB — CBC
HEMATOCRIT: 26.6 % — AB (ref 36.0–46.0)
HEMOGLOBIN: 8.5 g/dL — AB (ref 12.0–15.0)
MCH: 31.1 pg (ref 26.0–34.0)
MCHC: 32 g/dL (ref 30.0–36.0)
MCV: 97.4 fL (ref 80.0–100.0)
NRBC: 0 % (ref 0.0–0.2)
Platelets: 220 10*3/uL (ref 150–400)
RBC: 2.73 MIL/uL — AB (ref 3.87–5.11)
RDW: 15.8 % — ABNORMAL HIGH (ref 11.5–15.5)
WBC: 6.8 10*3/uL (ref 4.0–10.5)

## 2018-06-25 LAB — HEPARIN LEVEL (UNFRACTIONATED): Heparin Unfractionated: 0.65 IU/mL (ref 0.30–0.70)

## 2018-06-25 LAB — MAGNESIUM: Magnesium: 1.4 mg/dL — ABNORMAL LOW (ref 1.7–2.4)

## 2018-06-25 MED ORDER — SODIUM CHLORIDE 0.9 % IV SOLN
2.0000 g | Freq: Two times a day (BID) | INTRAVENOUS | Status: AC
  Administered 2018-06-25 – 2018-06-28 (×7): 2 g via INTRAVENOUS
  Filled 2018-06-25 (×7): qty 2

## 2018-06-25 MED ORDER — POTASSIUM CHLORIDE 20 MEQ/15ML (10%) PO SOLN
30.0000 meq | ORAL | Status: AC
  Administered 2018-06-25 (×2): 30 meq
  Filled 2018-06-25 (×2): qty 30

## 2018-06-25 MED ORDER — BISACODYL 5 MG PO TBEC
5.0000 mg | DELAYED_RELEASE_TABLET | Freq: Once | ORAL | Status: AC
  Administered 2018-06-25: 5 mg via ORAL
  Filled 2018-06-25: qty 1

## 2018-06-25 MED ORDER — PHENYLEPHRINE HCL-NACL 40-0.9 MG/250ML-% IV SOLN
0.0000 ug/min | INTRAVENOUS | Status: DC
  Administered 2018-06-25: 100 ug/min via INTRAVENOUS
  Administered 2018-06-26: 20 ug/min via INTRAVENOUS
  Administered 2018-06-26: 100 ug/min via INTRAVENOUS
  Administered 2018-06-26 – 2018-06-27 (×2): 120 ug/min via INTRAVENOUS
  Filled 2018-06-25 (×5): qty 250

## 2018-06-25 MED ORDER — DEXMEDETOMIDINE HCL IN NACL 400 MCG/100ML IV SOLN
0.0000 ug/kg/h | INTRAVENOUS | Status: AC
  Administered 2018-06-25: 1.2 ug/kg/h via INTRAVENOUS
  Administered 2018-06-25: 1 ug/kg/h via INTRAVENOUS
  Administered 2018-06-25: 1.1 ug/kg/h via INTRAVENOUS
  Administered 2018-06-26 (×2): 1.2 ug/kg/h via INTRAVENOUS
  Administered 2018-06-27: 0.6 ug/kg/h via INTRAVENOUS
  Filled 2018-06-25 (×7): qty 100

## 2018-06-25 MED ORDER — MAGNESIUM SULFATE 2 GM/50ML IV SOLN
2.0000 g | Freq: Once | INTRAVENOUS | Status: AC
  Administered 2018-06-25: 2 g via INTRAVENOUS
  Filled 2018-06-25: qty 50

## 2018-06-25 MED ORDER — DOCUSATE SODIUM 100 MG PO CAPS
100.0000 mg | ORAL_CAPSULE | Freq: Two times a day (BID) | ORAL | Status: DC
  Administered 2018-06-25 – 2018-06-26 (×3): 100 mg via ORAL
  Filled 2018-06-25 (×6): qty 1

## 2018-06-25 MED ORDER — QUETIAPINE FUMARATE 50 MG PO TABS
50.0000 mg | ORAL_TABLET | Freq: Two times a day (BID) | ORAL | Status: DC
  Administered 2018-06-25 – 2018-06-26 (×3): 50 mg via ORAL
  Filled 2018-06-25 (×3): qty 1

## 2018-06-25 NOTE — Progress Notes (Signed)
NAME:  [name], MRN:  [id number], DOB:  02-Apr-1956, LOS: 8 ADMISSION DATE:  06/17/2018, CONSULTATION DATE:  06/17/2018 REFERRING MD:  Dr. Lockie Mola, CHIEF COMPLAINT:  PE   Brief History   62 year old female hx CVA, COPD, who resides in SNF after recent ICH / IVH with L sided weakness. Adm 10/9  with hypoxia and PE. Started on heparin infusion.   10/14 AM patient became progressively lethargic with tachypnea and hypoxia. Patient transferred back to ICU , intubated  >> presumed aspiration pneumonia  GOC discussion with son, patient is now DNR with one way extubation planned.  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 10/14: Critical care reconsulted for respiratory distress  Consults: date of consult/date signed off & final recs:  PCCM 10/9 > Neurology 10/9 >  Procedures (surgical and bedside):  ETT10/14 >>  Significant Diagnostic Tests:  10/9 CTA PE > Positive for acute PE primarily within the right pulmonary arterial system with CT evidence of right heart strain (RV/LV Ratio is greater than 1). Mild alveolar edema is noted particularly in the right upper lobe. Small effusions and bibasilar dependent atelectatic changes. Aortic Atherosclerosis and Emphysema  Head Ct 10/9 >> IVH resolved, chronic right MCA infarct, no acute abnormality Echocardiogram 10/10 > nml LVSF ,grade 2 diastolic dysfunction. .    Micro Data:  BCx 2 10/9 >>  ng UC 10/9 > Klebsiella Pneumoniae  Blood 10/14 > MRSA resp 10/14 >> tracheal aspirate:psuedomonas   Antimicrobials:  Ceftriaxone 10/9 > 10/14  Cefepime 10/14 >> Flagyl 1014/ >10/16 Vanc  06/23/09/17  Subjective:   She is critically ill, intubated, on low-dose Neo-Synephrine. Agitated when Precedex started down, will add seroquel 50 BID.  Will use prn fentanyl before prn versed, restraint only if absolutely necessary Hypothermic to 97.5 Low urine output  Objective   Blood pressure (!) 86/69, pulse (!) 102, temperature (!) 97.5 F (36.4 C), temperature source Axillary, resp. rate 13, height 5\' 3"  (1.6 m), weight 69.5 kg, SpO2 95 %.    Vent Mode: PRVC FiO2 (%):  [40 %] 40 % Set Rate:  [18 bmp] 18 bmp Vt Set:  [410 mL] 410 mL PEEP:  [5 cmH20] 5 cmH20 Pressure Support:  [5 cmH20] 5 cmH20 Plateau Pressure:  [13 cmH20-16 cmH20] 13 cmH20   Intake/Output Summary (Last 24 hours) at 06/25/2018 1021 Last data filed at 06/25/2018 0900 Gross per 24 hour  Intake 1802.74 ml  Output 3060 ml  Net -1257.26 ml   Filed Weights   06/23/18 0500 06/24/18 0500 06/25/18 0500  Weight: 68.5 kg 69.2 kg 69.5 kg    Examination:  Elderly woman, intubated, sedated on Precedex but still with attempts to pull at lines so now in mitt,  Decreased breath sounds symmetrically, irregular rate controlled 1+ edema distally Soft abdomen  Chest x-ray personally reviewed which shows increasing edema pattern and infiltrates  Resolved Hospital Problem list   Klebsiella urinary tract infection, completed 5 days of ceftriaxone  Assessment & Plan:   Acute hypoxic respiratory failure.  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, IVH July 2019, Dementia  -Ammonia 30 on 10/14 -CT head 10/13  Negative for hemorrhage, was rechecked given IV heparin Plan Titrate Precedex to RA SS goal -1  Atrial fibrillation w/o RVR, currently rate controlled but did have one charted 149bpm at 0300 10/17, now resolved Plan Her QTC was over 500, will avoid amiodarone Use Neo-Synephrine for septic shock  History of coronary artery disease, grade 2 diastolic dysfunction and prior Coronary artery bypass grafting, hyperlipidemia and hypertension Plan Continue telemetry monitoring  AKI: sCR trending down last 4 days from 1.18 to 0.84 10/17 Plan  IV hydration  Now w/ lasix 20 BID  Fluid and electrolyte imbalance: Hypokalemia, hypophosphatemia, mild hyperchloremia, hypomagnesemia non-anion gap metabolic acidosis plan  Replace potassium, magnesium and phosphate  Dysphagia  Protein calorie malnutrition Plan Cont tube feeds  Constipation: mild belly exam but no stool production PLAN Dulcolax x1 Colace BID  Pressure injuries left heel and left elbow -Wound Care Following   Disposition / Summary of Today's Plan 06/25/18   Acute encephalopathy primarily metabolic in origin likely secondary to aspiration event versus hcap.  There has been some renal insult but now recovering, respiratory status has stabilized on the vent, attempts to weaning frustrated by agitation vs sedation. Replace electrolytes today, cont tube feeds, try to avoid benzo and treat agitation with seroquel 50 BID and fentanyl before resorting to versed Constipation addressed with dulcoloax x1 and colace BID     Code Status: Full Family Communication: Multiple attempts to reach son  Labs   CBC: Recent Labs  Lab 06/21/18 0635 06/22/18 0342 06/23/18 0007 06/24/18 0811 06/25/18 0547  WBC 6.8 5.5 8.3 9.5 6.8  HGB 8.7* 8.0* 7.7* 8.9* 8.5*  HCT 28.7* 25.7* 25.7* 28.2* 26.6*  MCV 100.3* 98.5 99.2 97.6 97.4  PLT 238 226 235 220 220    Basic Metabolic Panel: Recent Labs  Lab 06/19/18 0336  06/21/18 0635  06/22/18 0342 06/23/18 0301 06/24/18 0314 06/25/18 0547  NA 138   < > 139 141 138 136 134*  K 3.5   < > 3.7 3.1* 2.9* 3.1* 3.3*  CL 108   < > 108 112* 110 104 100  CO2 23   < > 23 21* 20* 22 26  GLUCOSE 89   < > 79 99 90 95 119*  BUN 12   < > 15 13 10 8 12   CREATININE 1.00   < > 1.12* 1.18* 1.10* 0.99 0.84  CALCIUM 7.5*   < > 8.2* 7.8* 7.3* 7.3* 7.7*  MG 2.5*  --   --  1.3* 1.3* 1.9 1.4*  PHOS  --   --   --   --  1.5* 3.2 2.5   < > = values in this interval not displayed.   GFR: Estimated Creatinine Clearance: 66.6 mL/min (by C-G formula based on SCr of 0.84 mg/dL). Recent Labs  Lab 06/22/18 0342 06/22/18 1218 06/23/18 0007 06/24/18 0811 06/25/18 0547  WBC 5.5  --  8.3 9.5 6.8  LATICACIDVEN 3.1* 4.2*  --   --   --     Liver Function Tests: Recent Labs  Lab 06/20/18 0251 06/22/18 0342 06/23/18 0301  AST 22 20 18   ALT 9 8 8   ALKPHOS 71 56 51  BILITOT 0.3 0.5 0.6  PROT 5.9* 5.2* 4.5*  ALBUMIN 2.7* 2.5* 2.2*   No results for input(s): LIPASE, AMYLASE in the last 168 hours. Recent Labs  Lab 06/22/18 0342  AMMONIA 30    ABG    Component Value Date/Time   PHART 7.265 (L) 06/22/2018 1237   PCO2ART 45.3 06/22/2018 1237   PO2ART 345.0 (H) 06/22/2018 1237   HCO3 20.7 06/22/2018 1237   TCO2 22 06/22/2018 1237   ACIDBASEDEF 6.0 (H) 06/22/2018 1237   O2SAT 100.0 06/22/2018 1237     Coagulation Profile: No results for input(s): INR, PROTIME in the last 168 hours.  Cardiac Enzymes: No results for input(s): CKTOTAL, CKMB, CKMBINDEX, TROPONINI in the last 168 hours.  HbA1C: Hgb A1c MFr Bld  Date/Time Value Ref Range Status  03/21/2018 06:50 AM 5.1 4.8 - 5.6 % Final    Comment:    (NOTE) Pre diabetes:          5.7%-6.4% Diabetes:              >6.4% Glycemic control for   <7.0% adults with diabetes   02/11/2011 06:00 AM  <5.7 % Final   5.4 (NOTE)                                                                       According to the ADA Clinical Practice  Recommendations for 2011, when HbA1c is used as a screening test:   >=6.5%   Diagnostic of Diabetes Mellitus           (if abnormal result  is confirmed)  5.7-6.4%   Increased risk of developing Diabetes Mellitus  References:Diagnosis and Classification of Diabetes Mellitus,Diabetes Care,2011,34(Suppl 1):S62-S69 and Standards of Medical Care in         Diabetes - 2011,Diabetes Care,2011,34  (Suppl 1):S11-S61.    CBG: Recent Labs  Lab 06/24/18 1513 06/24/18 1946 06/24/18 2349 06/25/18 0414 06/25/18 0754  GLUCAP 84 87 89 84 92     Marthenia Rolling, DO  PGY2 [place] 06/25/2018 10:22 AM

## 2018-06-25 NOTE — Progress Notes (Signed)
[place] for Heparin Indication: pulmonary embolus  Allergies  Allergen Reactions  . Avelox [Moxifloxacin Hcl In Nacl] Other (See Comments)    Mental breakdown  . Amoxicillin Hives  . Pamelor [Nortriptyline Hcl] Other (See Comments)    Made her want to hurt herself  . Penicillins Hives     Has patient had a PCN reaction causing immediate rash, facial/tongue/throat swelling, SOB or lightheadedness with hypotension: Yes Has patient had a PCN reaction causing severe rash involving mucus membranes or skin necrosis: Unk Has patient had a PCN reaction that required hospitalization: Unk Has patient had a PCN reaction occurring within the last 10 years: Unk If all of the above answers are "NO", then may proceed with Cephalosporin use.   . Other Itching    Any med that ends in -caine  . Hydrocodone Itching  . Oxycodone Itching  . Sulfa Antibiotics Other (See Comments)    "Allergic," per [facility]    Patient Measurements: Height: '5\' 3"'  (160 cm) Weight: 153 lb 3.5 oz (69.5 kg) IBW/kg (Calculated) : 52.4 Heparin Dosing Weight: 68 kg  Vital Signs: Temp: 97.5 F (36.4 C) (10/17 0757) Temp Source: Axillary (10/17 0757) BP: 86/69 (10/17 0806) Pulse Rate: 102 (10/17 0806)  Labs: Recent Labs    06/23/18 0007 06/23/18 0301 06/24/18 0314 06/24/18 0811 06/25/18 0547  HGB 7.7*  --   --  8.9* 8.5*  HCT 25.7*  --   --  28.2* 26.6*  PLT 235  --   --  220 220  HEPARINUNFRC 0.48  --  0.55  --  0.65  CREATININE  --  1.10* 0.99  --  0.84    Estimated Creatinine Clearance: 66.6 mL/min (by C-G formula based on SCr of 0.84 mg/dL).   Medications:  Scheduled:  . budesonide (PULMICORT) nebulizer solution  0.5 mg Nebulization BID  . chlorhexidine gluconate (MEDLINE KIT)  15 mL Mouth Rinse BID  . Chlorhexidine Gluconate Cloth  6 each Topical Daily  . docusate sodium  100 mg Oral BID  . feeding supplement (PRO-STAT SUGAR FREE 64)  30 mL Per Tube Daily  .  feeding supplement (VITAL AF 1.2 CAL)  1,000 mL Per Tube Q24H  . furosemide  20 mg Intravenous Q12H  . mouth rinse  15 mL Mouth Rinse 10 times per day  . pantoprazole sodium  40 mg Per Tube Q1200  . potassium chloride  30 mEq Per Tube Q4H  . povidone-iodine   Topical BID  . QUEtiapine  50 mg Oral BID    Assessment: 62 yo F presents with SOB. CT angio shows acute PE with heart strain. No anticoag PTA. Heparin rate was increased on 10/14 due to patient's preliminary bilateral lower extremity venous duplex reporting "highly mobile DVT noted in the proximal left femoral vein." Patient's heparin level this AM on the higher end of therapeutic at 0.65.    Hemoglobin remains stable from yesterday, pltc remains stable. No new issues reported with heparin gtt or bleeding per nurse.   Goal of Therapy:  Heparin level 0.3-0.7 units/ml Monitor platelets by anticoagulation protocol: Yes   Plan:  Decrease heparin to 700 units/hr  Daily heparin level, CBC Monitor closely for signs/symptoms of bleeding  Thank you for allowing pharmacy to be a part of this patient's care.  Leron Croak, PharmD PGY1 Pharmacy Resident Phone: [phone]  Please check AMION for all [place] phone numbers

## 2018-06-25 NOTE — Progress Notes (Signed)
Pharmacy Antibiotic Note  [name] is a 62 y.o. female admitted on 06/17/2018 with PE/UTI, now presenting with concerns of aspiration pneumonia. Patient is on D#4 of antibiotic treatment with cefepime. WBC remains wnl. Scr 0.84. Patient's trach aspirate grew out pseudomonas 10/16.   Plan: Increase cefepime to 2g IV Q12H Stop vancomycin per MD Monitor patient's Scr, clinical progression, abx LOT  Height: 5\' 3"  (160 cm) Weight: 153 lb 3.5 oz (69.5 kg) IBW/kg (Calculated) : 52.4  Temp (24hrs), Avg:97.7 F (36.5 C), Min:97 F (36.1 C), Max:98.1 F (36.7 C)  Recent Labs  Lab 06/21/18 0635 06/22/18 0342 06/22/18 1218 06/23/18 0007 06/23/18 0301 06/24/18 0314 06/24/18 0811 06/24/18 1116 06/25/18 0547  WBC 6.8 5.5  --  8.3  --   --  9.5  --  6.8  CREATININE 1.12* 1.18*  --   --  1.10* 0.99  --   --  0.84  LATICACIDVEN  --  3.1* 4.2*  --   --   --   --   --   --   VANCOTROUGH  --   --   --   --   --   --   --  29*  --     Estimated Creatinine Clearance: 66.6 mL/min (by C-G formula based on SCr of 0.84 mg/dL).    Allergies  Allergen Reactions  . Avelox [Moxifloxacin Hcl In Nacl] Other (See Comments)    Mental breakdown  . Amoxicillin Hives  . Pamelor [Nortriptyline Hcl] Other (See Comments)    Made her want to hurt herself  . Penicillins Hives     Has patient had a PCN reaction causing immediate rash, facial/tongue/throat swelling, SOB or lightheadedness with hypotension: Yes Has patient had a PCN reaction causing severe rash involving mucus membranes or skin necrosis: Unk Has patient had a PCN reaction that required hospitalization: Unk Has patient had a PCN reaction occurring within the last 10 years: Unk If all of the above answers are "NO", then may proceed with Cephalosporin use.   . Other Itching    Any med that ends in -caine  . Hydrocodone Itching  . Oxycodone Itching  . Sulfa Antibiotics Other (See Comments)    "Allergic," per [facility]    Antimicrobials this  admission: Vancomycin 10/14 >> 10/17 Cefepime 10/14 >> Flagyl 10/14 >> 10/16 Ceftriaxone 10/10 >> 10/14  Dose adjustments this admission: N/A  Microbiology results: 10/14 Trach aspirate - Moderate pseudomonas (S to cefepime) 10/14 BCx 1/2 Coag neg staph  10/9 MRSA PCR - negative 10/9 BCx - Neg 10/9 UCx - 100k K.pneumo (final)  Thank you for allowing pharmacy to be a part of this patient's care.  Lenord Carbo, PharmD PGY1 Pharmacy Resident Phone: [phone]  Please check AMION for all [facility] Pharmacy phone numbers

## 2018-06-25 NOTE — Progress Notes (Signed)
62 year old NHR hx CVA, COPD, recent ICH / IVH with L sided weakness.Adm 10/9with hypoxia and PE. Started on heparin infusion.   10/14 AM patient became progressively lethargic with tachypnea and hypoxia. Patient transferred back to ICU, intubated >>presumed aspiration pneumonia, head CT negative.  She remains critically ill and weaning attempts have been limited by intermittent agitation spite of maximum dose of Precedex.  She opens eyes to name but does not follow commands.  She is able to wean with good tidal volumes of pressure support 10/5 but goes apneic once fentanyl was given for sedation.  Decreased breath sounds bilateral, no rhonchi, 1+ edema, S1-S2 regular.  Chest x-ray personally reviewed which shows edema pattern with bibasilar airspace disease and effusions. Labs significant for hypokalemia and hypomagnesemia.  Impression/plan Acute respiratory failure-continue weaning attempts will need better control of agitation appeared to make some progress here.  Goal is for one-way extubation eventually once respiratory status is optimized.  Acute metabolic encephalopathy-continue Precedex and intermittent fentanyl.  Will add low-dose Seroquel 50 twice daily  DVT/PE-continue heparin HCAP/aspiration pneumonia-respiratory culture showing Pseudomonas, continue cefepime and discontinue vancomycin  Hypokalemia and hypomagnesemia will be repleted.  My critical care time x 64m  Cyril Mourning MD. FCCP. [facility] Pager [phone] If no response call [phone]   06/25/2018

## 2018-06-25 NOTE — Patient Outreach (Signed)
[facility]) Care Management  06/25/2018  [name] 1956-01-31 [id number]  Patient remains inpatient at this time. [facility] CSW will continue to complete chart review and will monitor patient's case closely. [facility] CSW will follow patient if/when she returns to SNF.  Dickie La, BSW, MSW, LCSW [facility] [name] Phone: [phone] Fax: [phone]

## 2018-06-26 ENCOUNTER — Inpatient Hospital Stay (HOSPITAL_COMMUNITY): Payer: PPO

## 2018-06-26 LAB — GLUCOSE, CAPILLARY
GLUCOSE-CAPILLARY: 101 mg/dL — AB (ref 70–99)
GLUCOSE-CAPILLARY: 108 mg/dL — AB (ref 70–99)
Glucose-Capillary: 104 mg/dL — ABNORMAL HIGH (ref 70–99)
Glucose-Capillary: 133 mg/dL — ABNORMAL HIGH (ref 70–99)
Glucose-Capillary: 98 mg/dL (ref 70–99)
Glucose-Capillary: 116 mg/dL — ABNORMAL HIGH (ref 70–99)

## 2018-06-26 LAB — BLOOD GAS, ARTERIAL
ACID-BASE DEFICIT: 1.1 mmol/L (ref 0.0–2.0)
BICARBONATE: 23.3 mmol/L (ref 20.0–28.0)
Drawn by: 347621
O2 Content: 4 L/min
O2 Saturation: 50.9 %
PCO2 ART: 39 mmHg (ref 32.0–48.0)
PH ART: 7.39 (ref 7.350–7.450)
Patient temperature: 97.3

## 2018-06-26 LAB — BASIC METABOLIC PANEL
Anion gap: 6 (ref 5–15)
BUN: 13 mg/dL (ref 6–20)
CALCIUM: 7.8 mg/dL — AB (ref 8.9–10.3)
CO2: 27 mmol/L (ref 22–32)
Chloride: 100 mmol/L (ref 98–111)
Creatinine, Ser: 0.75 mg/dL (ref 0.44–1.00)
GFR calc Af Amer: >60 mL/min (ref >60)
Glucose, Bld: 91 mg/dL (ref 70–99)
POTASSIUM: 3.5 mmol/L (ref 3.5–5.1)
SODIUM: 133 mmol/L — AB (ref 135–145)

## 2018-06-26 LAB — CBC
HCT: 25.1 % — ABNORMAL LOW (ref 36.0–46.0)
Hemoglobin: 7.6 g/dL — ABNORMAL LOW (ref 12.0–15.0)
MCH: 29.7 pg (ref 26.0–34.0)
MCHC: 30.3 g/dL (ref 30.0–36.0)
MCV: 98 fL (ref 80.0–100.0)
PLATELETS: 212 10*3/uL (ref 150–400)
RBC: 2.56 MIL/uL — AB (ref 3.87–5.11)
RDW: 15.8 % — AB (ref 11.5–15.5)
WBC: 5.4 10*3/uL (ref 4.0–10.5)
nRBC: 0 % (ref 0.0–0.2)

## 2018-06-26 LAB — HEPARIN LEVEL (UNFRACTIONATED)
HEPARIN UNFRACTIONATED: 0.26 [IU]/mL — AB (ref 0.30–0.70)
HEPARIN UNFRACTIONATED: 0.36 [IU]/mL (ref 0.30–0.70)

## 2018-06-26 LAB — PHOSPHORUS: Phosphorus: 1.6 mg/dL — ABNORMAL LOW (ref 2.5–4.6)

## 2018-06-26 LAB — MAGNESIUM: MAGNESIUM: 1.7 mg/dL (ref 1.7–2.4)

## 2018-06-26 MED ORDER — QUETIAPINE FUMARATE 100 MG PO TABS
100.0000 mg | ORAL_TABLET | Freq: Two times a day (BID) | ORAL | Status: DC
  Administered 2018-06-26: 100 mg via ORAL
  Filled 2018-06-26 (×2): qty 1

## 2018-06-26 MED ORDER — VITAL AF 1.2 CAL PO LIQD
1000.0000 mL | ORAL | Status: DC
  Administered 2018-06-27: 1000 mL

## 2018-06-26 MED ORDER — MAGNESIUM SULFATE 2 GM/50ML IV SOLN
2.0000 g | Freq: Once | INTRAVENOUS | Status: AC
  Administered 2018-06-26: 2 g via INTRAVENOUS
  Filled 2018-06-26: qty 50

## 2018-06-26 MED ORDER — LACTULOSE 10 GM/15ML PO SOLN
20.0000 g | Freq: Once | ORAL | Status: AC
  Administered 2018-06-26: 20 g
  Filled 2018-06-26: qty 30

## 2018-06-26 MED ORDER — FENTANYL CITRATE (PF) 100 MCG/2ML IJ SOLN
50.0000 ug | INTRAMUSCULAR | Status: DC | PRN
  Administered 2018-06-26: 50 ug via INTRAVENOUS
  Administered 2018-06-26: 100 ug via INTRAVENOUS
  Administered 2018-06-26: 50 ug via INTRAVENOUS
  Administered 2018-06-27: 100 ug via INTRAVENOUS
  Administered 2018-06-27 – 2018-06-28 (×3): 50 ug via INTRAVENOUS
  Administered 2018-06-28: 100 ug via INTRAVENOUS
  Filled 2018-06-26 (×7): qty 2

## 2018-06-26 MED ORDER — VITAL AF 1.2 CAL PO LIQD: 1000.0000 mL | ORAL | Status: DC

## 2018-06-26 MED ORDER — POTASSIUM PHOSPHATES 15 MMOLE/5ML IV SOLN
30.0000 mmol | Freq: Once | INTRAVENOUS | Status: AC
  Administered 2018-06-26: 30 mmol via INTRAVENOUS
  Filled 2018-06-26: qty 10

## 2018-06-26 NOTE — Progress Notes (Signed)
[place] for Heparin Indication: pulmonary embolus  Allergies  Allergen Reactions  . Avelox [Moxifloxacin Hcl In Nacl] Other (See Comments)    Mental breakdown  . Amoxicillin Hives  . Pamelor [Nortriptyline Hcl] Other (See Comments)    Made her want to hurt herself  . Penicillins Hives     Has patient had a PCN reaction causing immediate rash, facial/tongue/throat swelling, SOB or lightheadedness with hypotension: Yes Has patient had a PCN reaction causing severe rash involving mucus membranes or skin necrosis: Unk Has patient had a PCN reaction that required hospitalization: Unk Has patient had a PCN reaction occurring within the last 10 years: Unk If all of the above answers are "NO", then may proceed with Cephalosporin use.   . Other Itching    Any med that ends in -caine  . Hydrocodone Itching  . Oxycodone Itching  . Sulfa Antibiotics Other (See Comments)    "Allergic," per [facility]    Patient Measurements: Height: '5\' 3"'  (160 cm) Weight: 152 lb 1.9 oz (69 kg) IBW/kg (Calculated) : 52.4 Heparin Dosing Weight: 68 kg  Vital Signs: Temp: 98.2 F (36.8 C) (10/18 0800) Temp Source: Oral (10/18 0800) BP: 102/78 (10/18 0845) Pulse Rate: 102 (10/18 0830)  Labs: Recent Labs    06/24/18 0314  06/24/18 0811 06/25/18 0547 06/26/18 0518  HGB  --    < > 8.9* 8.5* 7.6*  HCT  --   --  28.2* 26.6* 25.1*  PLT  --   --  220 220 212  HEPARINUNFRC 0.55  --   --  0.65 0.26*  CREATININE 0.99  --   --  0.84 0.75   < > = values in this interval not displayed.    Estimated Creatinine Clearance: 69.7 mL/min (by C-G formula based on SCr of 0.75 mg/dL).   Medications:  Scheduled:  . budesonide (PULMICORT) nebulizer solution  0.5 mg Nebulization BID  . chlorhexidine gluconate (MEDLINE KIT)  15 mL Mouth Rinse BID  . Chlorhexidine Gluconate Cloth  6 each Topical Daily  . docusate sodium  100 mg Oral BID  . feeding supplement (PRO-STAT SUGAR FREE  64)  30 mL Per Tube Daily  . feeding supplement (VITAL AF 1.2 CAL)  1,000 mL Per Tube Q24H  . furosemide  20 mg Intravenous Q12H  . lactulose  20 g Per Tube Once  . mouth rinse  15 mL Mouth Rinse 10 times per day  . pantoprazole sodium  40 mg Per Tube Q1200  . povidone-iodine   Topical BID  . QUEtiapine  100 mg Oral BID    Assessment: 62 yo F presents with SOB. CT angio shows acute PE with heart strain. No anticoag PTA. Heparin rate was increased on 10/14 due to patient's preliminary bilateral lower extremity venous duplex reporting "highly mobile DVT noted in the proximal left femoral vein." Patient's heparin level this AM was slightly subtherapeutic at 0.26 at decreased gtt rate of 700 units/hr. No off times for heparin gtt noted on [facility] and confirmed with nurse.   Hemoglobin has decreased from 8.5 to 7.6 today, pltc remains stable. No issues with heparin gtt or signs/symptoms of bleeding per nurse.   Goal of Therapy:  Heparin level 0.3-0.7 units/ml Monitor platelets by anticoagulation protocol: Yes   Plan:  Increase heparin to 750 units/hr  6 hour heparin level  Daily heparin level, CBC Monitor closely for signs/symptoms of bleeding  Thank you for allowing pharmacy to be a part of this  patient's care.  Leron Croak, PharmD PGY1 Pharmacy Resident Phone: [phone]  Please check AMION for all [place] phone numbers

## 2018-06-26 NOTE — Progress Notes (Signed)
[place] for Heparin Indication: pulmonary embolus  Allergies  Allergen Reactions  . Avelox [Moxifloxacin Hcl In Nacl] Other (See Comments)    Mental breakdown  . Amoxicillin Hives  . Pamelor [Nortriptyline Hcl] Other (See Comments)    Made her want to hurt herself  . Penicillins Hives     Has patient had a PCN reaction causing immediate rash, facial/tongue/throat swelling, SOB or lightheadedness with hypotension: Yes Has patient had a PCN reaction causing severe rash involving mucus membranes or skin necrosis: Unk Has patient had a PCN reaction that required hospitalization: Unk Has patient had a PCN reaction occurring within the last 10 years: Unk If all of the above answers are "NO", then may proceed with Cephalosporin use.   . Other Itching    Any med that ends in -caine  . Hydrocodone Itching  . Oxycodone Itching  . Sulfa Antibiotics Other (See Comments)    "Allergic," per [facility]    Patient Measurements: Height: '5\' 3"'  (160 cm) Weight: 152 lb 1.9 oz (69 kg) IBW/kg (Calculated) : 52.4 Heparin Dosing Weight: 68 kg  Vital Signs: Temp: 97.7 F (36.5 C) (10/18 1515) Temp Source: Oral (10/18 1515) BP: 124/107 (10/18 1530) Pulse Rate: 79 (10/18 1530)  Labs: Recent Labs    06/24/18 0314  06/24/18 0811 06/25/18 0547 06/26/18 0518 06/26/18 1534  HGB  --    < > 8.9* 8.5* 7.6*  --   HCT  --   --  28.2* 26.6* 25.1*  --   PLT  --   --  220 220 212  --   HEPARINUNFRC 0.55  --   --  0.65 0.26* 0.36  CREATININE 0.99  --   --  0.84 0.75  --    < > = values in this interval not displayed.    Estimated Creatinine Clearance: 69.7 mL/min (by C-G formula based on SCr of 0.75 mg/dL).   Medications:  Scheduled:  . budesonide (PULMICORT) nebulizer solution  0.5 mg Nebulization BID  . chlorhexidine gluconate (MEDLINE KIT)  15 mL Mouth Rinse BID  . Chlorhexidine Gluconate Cloth  6 each Topical Daily  . docusate sodium  100 mg Oral BID  .  feeding supplement (PRO-STAT SUGAR FREE 64)  30 mL Per Tube Daily  . feeding supplement (VITAL AF 1.2 CAL)  1,000 mL Per Tube Q24H  . furosemide  20 mg Intravenous Q12H  . mouth rinse  15 mL Mouth Rinse 10 times per day  . pantoprazole sodium  40 mg Per Tube Q1200  . povidone-iodine   Topical BID  . QUEtiapine  100 mg Oral BID    Assessment: 62 yo F presents with SOB. CT angio shows acute PE with heart strain. Heparin level is 0.36, therapeutic after rate was increased to 750 units/hr this morning. No bleeding noted per chart.  Goal of Therapy:  Heparin level 0.3-0.7 units/ml Monitor platelets by anticoagulation protocol: Yes   Plan:  Continue heparin 750 units/hr  F/u AM labs Daily heparin level, CBC  Maryanna Shape, PharmD, BCPS, BCPPS Clinical Pharmacist  Pager: [phone]

## 2018-06-26 NOTE — Progress Notes (Signed)
62 year old NHR hx CVA, COPD, recent ICH / IVH with L sided weakness.Adm 10/9with hypoxia and PE. Started on heparin infusion.   10/14 AM patient became progressively lethargic with tachypnea and hypoxia. Patient transferred back to ICU, intubated >>presumed aspiration pneumonia, head CT negative.  Since then she has remained intubated critically ill with severe agitation in spite of maximum dose of Precedex.  Seroquel was added yesterday and with this she appears calmer. She seems to tolerate bedside weaning on pressure support 12/5 with good tidal volumes.  Exam shows decreased breath sounds, no rhonchi, S1-S2 irregular, 1+ edema, frail and deconditioned woman.  Chest x-ray personally reviewed which shows bibasal airspace disease and effusions.  Labs show mild hypokalemia, hypophosphatemia and hypomagnesemia.  Impression/plan  Acute respiratory failure-try to decrease pressure support.  Goal is for one-way extubation once respiratory status is optimized.  Acute metabolic encephalopathy-we will increase Seroquel 200 twice daily if QTC permits, continue Precedex and intermittent fentanyl.  HCAP with Pseudomonas and respiratory culture-continue cefepime for total 7 days until 10/21.  Hypokalemia/hypophosphatemia and hypomagnesemia will be repleted Continue diuresis with Lasix 20 every 12 for negative balance  Atrial fibrillation/RVR-goal will be rate control with Lopressor as needed DVT/PE-continue heparin, transition to Lovenox at some point  Son [name] has been updated as of yesterday  My critical care time x 56m  Cyril Mourning MD. Tonny Bollman. [place] [facility] Pager [phone] If no response call [phone]   06/26/2018

## 2018-06-26 NOTE — Progress Notes (Signed)
CSW remains involved for further needs. Pt remains intubated on vent at 30 percent. CSW will continue to follow for discharge needs once medically stable.    Claude Manges Kolyn Rozario, MSW, LCSW-A Emergency Department Clinical Social Worker [phone]

## 2018-06-26 NOTE — Progress Notes (Signed)
Nutrition Follow-up  DOCUMENTATION CODES:   Non-severe (moderate) malnutrition in context of chronic illness  INTERVENTION:    Increase Vital AF 1.2 to goal rate of 45 ml/h with pro-stat 30 ml once daily to provide 1396 kcal, 96 gm protein, 876 ml free water daily.  Continue to monitor magnesium, potassium, and phosphorus, MD to replete as needed, as pt is at risk for refeeding syndrome given moderate malnutrition.  NUTRITION DIAGNOSIS:   Moderate Malnutrition related to chronic illness(dementia, COPD, CKD stage III) as evidenced by mild fat depletion, mild muscle depletion, percent weight loss(12.5% weight loss in 3 months).  Ongoing   GOAL:   Patient will meet greater than or equal to 90% of their needs  Being addressed with TF  MONITOR:   Vent status, TF tolerance, Weight trends, Labs, Skin  ASSESSMENT:   62 year old female who presented to the ED on 10/9 from [facility] with cough and SOB. Pt admitted with PE. PMH significant for dementia, CAD, hypertension, hyperlipidemia, COPD, CKD stage III, hypothyroidism, stroke, depression, atrial fibrillation, and GERD.  Patient is tolerating TF well at 20 ml/h via OGT. Showing signs of refeeding syndrome with low magnesium, potassium, and phosphorus. Potassium and magnesium WNL today with IV repletion. Spoke with CCM, okay to advance TF to goal rate. Plans for 1-way extubation when respiratory status has bene optimized.   Patient is currently intubated on ventilator support MV: 7.3 L/min Temp (24hrs), Avg:98.1 F (36.7 C), Min:97.5 F (36.4 C), Max:98.6 F (37 C)   Labs reviewed. Sodium 133 (L), phosphorus 1.6 (L) CBG's: [phone] Medications reviewed and include Neosynephrine, potassium phosphate, magnesium sulfate, Lasix, Colace.   I/O +3.8 L since admission    Diet Order:   Diet Order            Diet NPO time specified  Diet effective now              EDUCATION NEEDS:   No education needs have been  identified at this time  Skin:  Skin Assessment: Skin Integrity Issues: Skin Integrity Issues:: DTI, Unstageable DTI: L elbow Unstageable: L ankle  Last BM:  10/18 (type 4)  Height:   Ht Readings from Last 1 Encounters:  06/22/18 5\' 3"  (1.6 m)    Weight:   Wt Readings from Last 1 Encounters:  06/26/18 69 kg    Ideal Body Weight:  54.55 kg  BMI:  Body mass index is 26.95 kg/m.  Estimated Nutritional Needs:   Kcal:  1375  Protein:  95-110 gm  Fluid:  1.7-1.9 L    Joaquin Courts, RD, LDN, CNSC Pager [phone] After Hours Pager [phone]

## 2018-06-27 ENCOUNTER — Inpatient Hospital Stay (HOSPITAL_COMMUNITY): Payer: PPO

## 2018-06-27 LAB — HEPARIN LEVEL (UNFRACTIONATED): Heparin Unfractionated: 0.27 IU/mL — ABNORMAL LOW (ref 0.30–0.70)

## 2018-06-27 LAB — GLUCOSE, CAPILLARY
GLUCOSE-CAPILLARY: 92 mg/dL (ref 70–99)
GLUCOSE-CAPILLARY: 117 mg/dL — AB (ref 70–99)
GLUCOSE-CAPILLARY: 104 mg/dL — AB (ref 70–99)
Glucose-Capillary: 104 mg/dL — ABNORMAL HIGH (ref 70–99)
Glucose-Capillary: 101 mg/dL — ABNORMAL HIGH (ref 70–99)

## 2018-06-27 LAB — BASIC METABOLIC PANEL
Anion gap: 8 (ref 5–15)
BUN: 14 mg/dL (ref 6–20)
CALCIUM: 7.8 mg/dL — AB (ref 8.9–10.3)
CHLORIDE: 102 mmol/L (ref 98–111)
CO2: 26 mmol/L (ref 22–32)
CREATININE: 0.74 mg/dL (ref 0.44–1.00)
GFR calc non Af Amer: >60 mL/min (ref >60)
GLUCOSE: 106 mg/dL — AB (ref 70–99)
Potassium: 3.3 mmol/L — ABNORMAL LOW (ref 3.5–5.1)
Sodium: 136 mmol/L (ref 135–145)

## 2018-06-27 LAB — CBC
HEMATOCRIT: 26.9 % — AB (ref 36.0–46.0)
HEMOGLOBIN: 8.3 g/dL — AB (ref 12.0–15.0)
MCH: 30.4 pg (ref 26.0–34.0)
MCHC: 30.9 g/dL (ref 30.0–36.0)
MCV: 98.5 fL (ref 80.0–100.0)
Platelets: 213 10*3/uL (ref 150–400)
RBC: 2.73 MIL/uL — ABNORMAL LOW (ref 3.87–5.11)
RDW: 16.4 % — AB (ref 11.5–15.5)
WBC: 7.5 10*3/uL (ref 4.0–10.5)
nRBC: 0 % (ref 0.0–0.2)

## 2018-06-27 LAB — PHOSPHORUS: Phosphorus: 2.9 mg/dL (ref 2.5–4.6)

## 2018-06-27 LAB — CULTURE, BLOOD (ROUTINE X 2)
Culture: NO GROWTH
Special Requests: ADEQUATE

## 2018-06-27 LAB — MAGNESIUM: Magnesium: 1.7 mg/dL (ref 1.7–2.4)

## 2018-06-27 MED ORDER — QUETIAPINE FUMARATE 50 MG PO TABS: 150.0000 mg | ORAL_TABLET | Freq: Two times a day (BID) | ORAL | Status: DC

## 2018-06-27 MED ORDER — QUETIAPINE FUMARATE 50 MG PO TABS
150.0000 mg | ORAL_TABLET | Freq: Two times a day (BID) | ORAL | Status: DC
  Administered 2018-06-27: 150 mg via ORAL
  Filled 2018-06-27 (×3): qty 1

## 2018-06-27 MED ORDER — QUETIAPINE FUMARATE 50 MG PO TABS
150.0000 mg | ORAL_TABLET | Freq: Two times a day (BID) | ORAL | Status: DC
  Administered 2018-06-27 – 2018-06-28 (×2): 150 mg
  Filled 2018-06-27 (×4): qty 1

## 2018-06-27 MED ORDER — ENOXAPARIN SODIUM 80 MG/0.8ML ~~LOC~~ SOLN
1.0000 mg/kg | Freq: Two times a day (BID) | SUBCUTANEOUS | Status: DC
  Administered 2018-06-27 – 2018-06-28 (×4): 70 mg via SUBCUTANEOUS
  Filled 2018-06-27 (×5): qty 0.7

## 2018-06-27 MED ORDER — DOCUSATE SODIUM 50 MG/5ML PO LIQD
100.0000 mg | Freq: Two times a day (BID) | ORAL | Status: DC
  Administered 2018-06-27 – 2018-06-28 (×2): 100 mg
  Filled 2018-06-27 (×2): qty 10

## 2018-06-27 NOTE — Progress Notes (Signed)
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 Started on heparin infusion.   10/14 AM patient became progressively lethargic with tachypnea and hypoxia. Patient transferred back to ICU, intubated >>presumed aspiration pneumonia,head CT negative.   She remains critically ill, requiring Precedex for agitation and low-dose Neo-Synephrine, Seroquel has been titrated to 100 twice daily but still has episodes of breakthrough agitation When she is calm, she seems to wean on pressure support 12/5 with good tidal volumes. Exam-agitated movement grossly nonfocal, decreased breath sounds bilateral, 1+ edema, S1-S2 irregular, soft nontender abdomen.  Labs show stable anemia, no leukocytosis, electrolytes pending. Chest x-ray personally reviewed continues to show bibasilar airspace disease and effusions.  Impression/plan  acute respiratory failure -likely due to aspiration pneumonia, weaning attempts are being limited by agitation, and goal is for one-way extubation once fully optimized.  Acute metabolic encephalopathy-increase Seroquel to 150 twice daily, continue low-dose Precedex and try to minimize this since it is causing some hypotension and intermittent fentanyl. She is requiring Neo-Synephrine likely due to medication induced hypotension.  HCAP with Pseudomonas-continue cefepime for 7 days until 10/21.  Volume overload-continue Lasix 20 every 12, left lites have been repleted  DVT/PE -transition to Lovenox  My critical care time x 49m  Cyril Mourning MD. FCCP. [place] Pager [phone] If no response call [phone]   06/27/2018

## 2018-06-28 ENCOUNTER — Inpatient Hospital Stay (HOSPITAL_COMMUNITY): Payer: PPO

## 2018-06-28 LAB — GLUCOSE, CAPILLARY
GLUCOSE-CAPILLARY: 76 mg/dL (ref 70–99)
GLUCOSE-CAPILLARY: 78 mg/dL (ref 70–99)
GLUCOSE-CAPILLARY: 98 mg/dL (ref 70–99)
Glucose-Capillary: 90 mg/dL (ref 70–99)
Glucose-Capillary: 74 mg/dL (ref 70–99)
Glucose-Capillary: 73 mg/dL (ref 70–99)

## 2018-06-28 LAB — CBC
HCT: 24.1 % — ABNORMAL LOW (ref 36.0–46.0)
HEMOGLOBIN: 7.4 g/dL — AB (ref 12.0–15.0)
MCH: 30.3 pg (ref 26.0–34.0)
MCHC: 30.7 g/dL (ref 30.0–36.0)
MCV: 98.8 fL (ref 80.0–100.0)
NRBC: 0 % (ref 0.0–0.2)
Platelets: 126 10*3/uL — ABNORMAL LOW (ref 150–400)
RBC: 2.44 MIL/uL — AB (ref 3.87–5.11)
RDW: 16.5 % — ABNORMAL HIGH (ref 11.5–15.5)
WBC: 5 10*3/uL (ref 4.0–10.5)

## 2018-06-28 LAB — BASIC METABOLIC PANEL
ANION GAP: 9 (ref 5–15)
BUN: 16 mg/dL (ref 6–20)
CHLORIDE: 98 mmol/L (ref 98–111)
CO2: 30 mmol/L (ref 22–32)
CREATININE: 0.75 mg/dL (ref 0.44–1.00)
Calcium: 8.1 mg/dL — ABNORMAL LOW (ref 8.9–10.3)
GFR calc Af Amer: >60 mL/min (ref >60)
GFR calc non Af Amer: >60 mL/min (ref >60)
Glucose, Bld: 123 mg/dL — ABNORMAL HIGH (ref 70–99)
Potassium: 3.1 mmol/L — ABNORMAL LOW (ref 3.5–5.1)
SODIUM: 137 mmol/L (ref 135–145)

## 2018-06-28 MED ORDER — METOPROLOL TARTRATE 25 MG/10 ML ORAL SUSPENSION
12.5000 mg | Freq: Two times a day (BID) | ORAL | Status: DC
  Administered 2018-06-28: 12.5 mg
  Filled 2018-06-28 (×3): qty 5

## 2018-06-28 MED ORDER — POTASSIUM CHLORIDE 20 MEQ/15ML (10%) PO SOLN
40.0000 meq | Freq: Once | ORAL | Status: AC
  Administered 2018-06-28: 40 meq
  Filled 2018-06-28: qty 30

## 2018-06-28 MED ORDER — ORAL CARE MOUTH RINSE
15.0000 mL | Freq: Two times a day (BID) | OROMUCOSAL | Status: DC
  Administered 2018-06-29 – 2018-07-07 (×13): 15 mL via OROMUCOSAL

## 2018-06-28 NOTE — Progress Notes (Signed)
NAME:  [name], MRN:  [id number], DOB:  06-07-1956, LOS: 11 ADMISSION DATE:  06/17/2018, CONSULTATION DATE:  06/17/2018 REFERRING MD:  Dr. Lockie Mola, CHIEF COMPLAINT:  PE   Brief History   62 year old female hx CVA, COPD, who resides in SNF after recent ICH / IVH with L sided weakness. Adm 10/9  with hypoxia and PE. Started on heparin infusion.   10/14 AM patient became progressively lethargic with tachypnea and hypoxia. 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 10/14: Critical care reconsulted for respiratory distress  Consults: date of consult/date signed off & final recs:  PCCM 10/9 > Neurology 10/9 >  Procedures (surgical and bedside):  ETT10/14 >>  Significant Diagnostic Tests:  10/9 CTA PE > Positive for acute PE primarily within the right pulmonary arterial system with CT evidence of right heart strain (RV/LV Ratio is greater than 1). Mild alveolar edema is noted particularly in the right upper lobe. Small effusions and bibasilar dependent atelectatic changes. Aortic Atherosclerosis and Emphysema  Head CT 10/9 >> IVH resolved, chronic right MCA infarct, no acute abnormality Echocardiogram 10/10 > nml LVSF ,grade 2 diastolic dysfunction. .    Micro Data:  BCx 2 10/9 >>  ng UC 10/9 > Klebsiella Pneumoniae  Blood 10/14 > coag neg staph 1/2 >> favor contaminant resp 10/14 >> pseudomonas   Antimicrobials:  Ceftriaxone 10/9 > 10/14  Cefepime 10/14 >> Flagyl 1014/ >10/16 Vanc 06/23/09/17  Subjective:   Remains critically ill,  neo gtt tapered off Off precedex this am, relatively calm AF-RVR on monitor with HR 130s   Objective   Blood pressure (!) 142/91, pulse (!) 121, temperature 98.1 F (36.7 C), temperature source Axillary, resp. rate (!) 25, height 5\' 3"  (1.6 m), weight 65 kg, SpO2 100 %.    Vent Mode: CPAP;PSV FiO2 (%):  [30 %] 30 % Set Rate:  [18 bmp] 18 bmp Vt Set:  [410 mL] 410 mL PEEP:  [5 cmH20] 5 cmH20 Pressure Support:  [5 cmH20-10 cmH20] 5 cmH20 Plateau Pressure:  [13 cmH20-15 cmH20] 13 cmH20   Intake/Output Summary (Last 24 hours) at 06/28/2018 0832 Last data filed at 06/28/2018 0800 Gross per 24 hour  Intake 1792.92 ml  Output 2885 ml  Net -1092.08 ml   Filed Weights   06/25/18 0500 06/26/18 0500 06/28/18 0500  Weight: 69.5 kg 69 kg 65 kg    Examination: Elderly woman, chronically ill-appearing, much calmer even off Precedex this morning. 1+ edema No pallor/icterus, no petechia on skin Decreased breath sounds bilateral, weaning on pressure support 10/5 with good tidal volume Soft and nontender abdomen Grossly nonfocal, follows commands and motioning for tube to be taken out  Chest x-ray personally reviewed which shows remarkable improvement in bilateral airspace disease and effusions  Resolved Hospital Problem list   Klebsiella urinary tract infection, completed 5 days of ceftriaxone  Assessment & Plan:   Acute hypoxic respiratory failure.  Suspect aspiration pneumonia with small effusions Plan Tolerating pressure support , this is the best she has looked and I doubt there will be further improvement, may  be our window for trial of extubation Ct  cefepime until 10/21  Pulmonary embolus Plan On treatment dose Lovenox , given  history of IVH, not sure what would be the best anticoagulation for her ? 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 Replete hypokalemia, hypophosphatemia and hypomagnesemia have been repleted  HCAP with Pseudomonas -cefepime until 10/21  Dysphagia  Protein calorie malnutrition Plan Cont tube feeds, will need swallow evaluation once extubated  Constipation: mild belly exam but no stool production PLAN Dulcolax x1 Colace BID  Pressure injuries left heel and left elbow -Wound Care Following   Disposition / Summary of Today's Plan 06/28/18   This is the best she has looked all week, tolerating pressure support 5/5, encephalopathy is improved, chest x-ray looks better, this is her window to extubate.  As per prior discussion with son, no reintubation and she will be a full DNR afterwards    Code Status: Full Family Communication: Liz Malady updated  Labs   CBC: Recent Labs  Lab 06/24/18 0811 06/25/18 0547 06/26/18 0518 06/27/18 0658 06/28/18 0500  WBC 9.5 6.8 5.4 7.5 5.0  HGB 8.9* 8.5* 7.6* 8.3* 7.4*  HCT 28.2* 26.6* 25.1* 26.9* 24.1*  MCV 97.6 97.4 98.0 98.5 98.8  PLT 220 220 212 213 126*    Basic Metabolic Panel: Recent Labs  Lab 06/23/18 0301 06/24/18 0314 06/25/18 0547 06/26/18 0518 06/27/18 0658 06/28/18 0500  NA 138 136 134* 133* 136 137  K 2.9* 3.1* 3.3* 3.5 3.3* 3.1*  CL 110 104 100 100 102 98  CO2 20* 22 26 27 26 30   GLUCOSE 90 95 119* 91 106* 123*  BUN 10 8 12 13 14 16   CREATININE 1.10* 0.99 0.84 0.75 0.74 0.75  CALCIUM 7.3* 7.3* 7.7* 7.8* 7.8* 8.1*  MG 1.3* 1.9  1.4* 1.7 1.7  --   PHOS 1.5* 3.2 2.5 1.6* 2.9  --    GFR: Estimated Creatinine Clearance: 67.8 mL/min (by C-G formula based on SCr of 0.75 mg/dL). Recent Labs  Lab 06/22/18 0342 06/22/18 1218  06/25/18 0547 06/26/18 0518 06/27/18 0658 06/28/18 0500  WBC 5.5  --    < > 6.8 5.4 7.5 5.0  LATICACIDVEN 3.1* 4.2*  --   --   --   --   --    < > = values in this interval not displayed.    Liver Function Tests: Recent Labs  Lab 06/22/18 0342 06/23/18 0301  AST 20 18  ALT 8 8  ALKPHOS 56 51  BILITOT 0.5 0.6  PROT 5.2* 4.5*  ALBUMIN 2.5* 2.2*   No results for input(s): LIPASE, AMYLASE in the last 168 hours. Recent Labs  Lab 06/22/18 0342  AMMONIA 30    ABG    Component Value Date/Time   PHART 7.265 (L) 06/22/2018 1237   PCO2ART 45.3 06/22/2018 1237   PO2ART 345.0 (H) 06/22/2018 1237   HCO3 20.7 06/22/2018  1237   TCO2 22 06/22/2018 1237   ACIDBASEDEF 6.0 (H) 06/22/2018 1237   O2SAT 100.0 06/22/2018 1237     Coagulation Profile: No results for input(s): INR, PROTIME in the last 168 hours.  Cardiac Enzymes: No results for input(s): CKTOTAL, CKMB, CKMBINDEX, TROPONINI in the last 168 hours.  HbA1C: Hgb A1c MFr Bld  Date/Time Value Ref Range Status  03/21/2018 06:50 AM 5.1 4.8 - 5.6 % Final    Comment:    (NOTE) Pre diabetes:          5.7%-6.4% Diabetes:              >6.4% Glycemic control for   <7.0% adults with diabetes   02/11/2011 06:00 AM  <5.7 % Final   5.4 (NOTE)                                                                       According to the ADA Clinical Practice Recommendations for 2011, when HbA1c is used as a screening test:   >=6.5%   Diagnostic of Diabetes Mellitus           (if abnormal result  is confirmed)  5.7-6.4%   Increased risk of developing Diabetes Mellitus  References:Diagnosis and Classification of Diabetes Mellitus,Diabetes Care,2011,34(Suppl 1):S62-S69 and Standards of Medical Care in         Diabetes - 2011,Diabetes Care,2011,34    (Suppl 1):S11-S61.    CBG: Recent Labs  Lab 06/27/18 1541 06/27/18 1931 06/28/18 0013 06/28/18 0349 06/28/18 0735  GLUCAP 104* 101* 78 90 76     My critical care time x 59m  Cyril Mourning MD. FCCP. [place] [facility] Pager [phone] If no response call [phone]    06/28/2018 8:32 AM

## 2018-06-28 NOTE — Procedures (Signed)
Extubation Procedure Note  Patient Details:   Name: [name] DOB: May 29, 1956 MRN: [id number]   Airway Documentation:    Vent end date: 06/28/18 Vent end time: 0933   Evaluation  O2 sats: stable throughout Complications: No apparent complications Patient did tolerate procedure well. Bilateral Breath Sounds: Clear, Diminished   Yes   RT extubated patient to 3L [place] per MD order with RN at bedside. RT notified Dr.Alva, MD of poor cuff leak and he said to proceed with the extubation. Patient tolerated well currently sating 98%. No stridor noted and RT will continue to monitor as needed.   Lura Em 06/28/2018, 9:36 AM

## 2018-06-29 ENCOUNTER — Inpatient Hospital Stay (HOSPITAL_COMMUNITY): Payer: PPO

## 2018-06-29 LAB — BASIC METABOLIC PANEL
Anion gap: 11 (ref 5–15)
BUN: 13 mg/dL (ref 6–20)
CALCIUM: 8.9 mg/dL (ref 8.9–10.3)
CO2: 31 mmol/L (ref 22–32)
CREATININE: 0.76 mg/dL (ref 0.44–1.00)
Chloride: 96 mmol/L — ABNORMAL LOW (ref 98–111)
Glucose, Bld: 107 mg/dL — ABNORMAL HIGH (ref 70–99)
Potassium: 3.6 mmol/L (ref 3.5–5.1)
SODIUM: 138 mmol/L (ref 135–145)

## 2018-06-29 LAB — GLUCOSE, CAPILLARY
GLUCOSE-CAPILLARY: 82 mg/dL (ref 70–99)
GLUCOSE-CAPILLARY: 78 mg/dL (ref 70–99)
Glucose-Capillary: 49 mg/dL — ABNORMAL LOW (ref 70–99)
Glucose-Capillary: 72 mg/dL (ref 70–99)
Glucose-Capillary: 80 mg/dL (ref 70–99)
Glucose-Capillary: 84 mg/dL (ref 70–99)
Glucose-Capillary: 94 mg/dL (ref 70–99)
Glucose-Capillary: 99 mg/dL (ref 70–99)

## 2018-06-29 LAB — CBC
HCT: 28.8 % — ABNORMAL LOW (ref 36.0–46.0)
Hemoglobin: 9 g/dL — ABNORMAL LOW (ref 12.0–15.0)
MCH: 31 pg (ref 26.0–34.0)
MCHC: 31.3 g/dL (ref 30.0–36.0)
MCV: 99.3 fL (ref 80.0–100.0)
NRBC: 0 % (ref 0.0–0.2)
PLATELETS: 181 10*3/uL (ref 150–400)
RBC: 2.9 MIL/uL — ABNORMAL LOW (ref 3.87–5.11)
RDW: 16.7 % — AB (ref 11.5–15.5)
WBC: 6.1 10*3/uL (ref 4.0–10.5)

## 2018-06-29 MED ORDER — DOCUSATE SODIUM 50 MG/5ML PO LIQD
100.0000 mg | Freq: Two times a day (BID) | ORAL | Status: DC
  Administered 2018-06-29 – 2018-07-04 (×10): 100 mg via ORAL
  Filled 2018-06-29 (×11): qty 10

## 2018-06-29 MED ORDER — ENOXAPARIN SODIUM 60 MG/0.6ML ~~LOC~~ SOLN
60.0000 mg | Freq: Two times a day (BID) | SUBCUTANEOUS | Status: DC
  Administered 2018-06-29 – 2018-07-02 (×8): 60 mg via SUBCUTANEOUS
  Filled 2018-06-29 (×10): qty 0.6

## 2018-06-29 MED ORDER — QUETIAPINE FUMARATE 25 MG PO TABS
150.0000 mg | ORAL_TABLET | Freq: Two times a day (BID) | ORAL | Status: DC
  Administered 2018-06-29 – 2018-06-30 (×2): 150 mg via ORAL
  Filled 2018-06-29: qty 6
  Filled 2018-06-29: qty 1

## 2018-06-29 MED ORDER — METOPROLOL TARTRATE 25 MG/10 ML ORAL SUSPENSION
12.5000 mg | Freq: Two times a day (BID) | ORAL | Status: DC
  Administered 2018-06-29: 12.5 mg via ORAL
  Filled 2018-06-29: qty 10
  Filled 2018-06-29: qty 5

## 2018-06-29 MED ORDER — PANTOPRAZOLE SODIUM 40 MG PO PACK
40.0000 mg | PACK | Freq: Every day | ORAL | Status: DC
  Administered 2018-06-30 – 2018-07-03 (×4): 40 mg via ORAL
  Filled 2018-06-29 (×5): qty 20

## 2018-06-29 MED ORDER — FENTANYL CITRATE (PF) 100 MCG/2ML IJ SOLN
25.0000 ug | Freq: Once | INTRAMUSCULAR | Status: AC
  Administered 2018-06-29: 25 ug via INTRAVENOUS
  Filled 2018-06-29: qty 2

## 2018-06-29 MED ORDER — DILTIAZEM HCL-DEXTROSE 100-5 MG/100ML-% IV SOLN (PREMIX)
5.0000 mg/h | INTRAVENOUS | Status: DC
  Administered 2018-06-29: 5 mg/h via INTRAVENOUS
  Administered 2018-06-29: 10 mg/h via INTRAVENOUS
  Filled 2018-06-29 (×2): qty 100

## 2018-06-29 NOTE — Evaluation (Signed)
Physical Therapy Evaluation Patient Details Name: [name] MRN: [id number] DOB: [birth year] Today's Date: 06/29/2018   History of Present Illness  62 y.o. female admitted on 06/17/18 for hypoxia and (+) PE.  Intubated 10/14-10/20/19.  Pt with significant PMH of R MCA stroke with residual L sided weakness, A-fib, HTN, CABG, COPD, CAD.    Clinical Impression  Pt is a poor historian, but is likely non-ambulatory at baseline.  She was participative in my assessment and attempted to stand (after she told me she stands to get into her [facility]), then after multiple failed attempts she reported that the SNF used a "machine" to get her up to the [facility].  I am not sure which she does, but her functional presentation points to the later (using a lift to get up).  She is appropriate to return to SNF at discharge.   PT to follow acutely for deficits listed below.      Follow Up Recommendations SNF    Equipment Recommendations  Hospital bed;Wheelchair cushion (measurements PT);Wheelchair (measurements PT);Other (comment)(hoyer lift)    Recommendations for Other Services   NA    Precautions / Restrictions Precautions Precautions: Fall Precaution Comments: left sided weakness Required Braces or Orthoses: Other Brace/Splint Other Brace/Splint: PRAFO for L foot      Mobility  Bed Mobility               General bed mobility comments: NT as ultimately I used the maxi sky lift to get her back into the bed.   Transfers Overall transfer level: Needs assistance Equipment used: None Transfers: Sit to/from Stand Sit to Stand: Max assist         General transfer comment: Attempted to stand multiple times from the recliner chair without success.  Pt initially said she stood to get into her WC at SNF, but then later, she stated that they used a machine.  Ultimately, I had to use the maxi sky total lift to get her safely back into the bed.          Balance Overall balance assessment: Needs  assistance Sitting-balance support: Feet supported;Single extremity supported Sitting balance-Leahy Scale: Zero Sitting balance - Comments: max assist to assist pt in brining her trunk forward off of the recliner chair back rest.  Postural control: Posterior lean                                   Pertinent Vitals/Pain Pain Assessment: Faces Faces Pain Scale: Hurts little more Pain Location: back Pain Descriptors / Indicators: Aching Pain Intervention(s): Limited activity within patient's tolerance;Monitored during session;Repositioned    Home Living Family/patient expects to be discharged to:: Skilled nursing facility                 Additional Comments: Pt from SNF returning to SNF    Prior Function Level of Independence: Needs assistance   Gait / Transfers Assistance Needed: Pt is a poor historian, based on her functional presentation she was either a total lift using a machine to her WC or a heavy squat pivot to the [facility] prior to this admission.   ADL's / Homemaking Assistance Needed: likely total A        Hand Dominance   Dominant Hand: Right    Extremity/Trunk Assessment   Upper Extremity Assessment Upper Extremity Assessment: LUE deficits/detail LUE Deficits / Details: left arm with increased flexion tone and very contracted with  fisting hand.  She needs her nails cut as she is at risk for her nails to make wounds on her palm.     Lower Extremity Assessment Lower Extremity Assessment: LLE deficits/detail LLE Deficits / Details: left leg also with increased tone and L ankle pointed into dorsiflexion despite ill fitting PRAFO boot.  She is tight on this side and unable to functionally use this leg for transfers.     Cervical / Trunk Assessment Cervical / Trunk Assessment: Other exceptions Cervical / Trunk Exceptions: Pt with very stiff trunk, difficulty coming forward over her legs and feet when I attempted to have her squat pivot back to the bed.   Posterior preference.   Communication   Communication: No difficulties  Cognition Arousal/Alertness: Awake/alert Behavior During Therapy: WFL for tasks assessed/performed Overall Cognitive Status: History of cognitive impairments - at baseline                                 General Comments: Likely her baseline             Assessment/Plan    PT Assessment Patient needs continued PT services  PT Problem List Decreased strength;Decreased range of motion;Decreased mobility;Decreased balance;Decreased cognition;Decreased knowledge of use of DME;Decreased knowledge of precautions;Decreased safety awareness       PT Treatment Interventions Functional mobility training;Therapeutic exercise;Therapeutic activities;Neuromuscular re-education;Balance training;Cognitive remediation;Patient/family education    PT Goals (Current goals can be found in the Care Plan section)  Acute Rehab PT Goals Patient Stated Goal: none stated PT Goal Formulation: With patient Time For Goal Achievement: 07/13/18 Potential to Achieve Goals: Fair    Frequency Min 2X/week           AM-PAC PT "6 Clicks" Daily Activity  Outcome Measure Difficulty turning over in bed (including adjusting bedclothes, sheets and blankets)?: Unable Difficulty moving from lying on back to sitting on the side of the bed? : Unable Difficulty sitting down on and standing up from a chair with arms (e.g., wheelchair, bedside commode, etc,.)?: Unable Help needed moving to and from a bed to chair (including a wheelchair)?: Total Help needed walking in hospital room?: Total Help needed climbing 3-5 steps with a railing? : Total 6 Click Score: 6    End of Session Equipment Utilized During Treatment: Gait belt Activity Tolerance: Patient tolerated treatment well Patient left: in bed;with call bell/phone within reach;with bed alarm set Nurse Communication: Mobility status;Need for lift equipment PT Visit Diagnosis:  Muscle weakness (generalized) (M62.81);Difficulty in walking, not elsewhere classified (R26.2)    Time: 1610-9604 PT Time Calculation (min) (ACUTE ONLY): 23 min   Charges:          Lurena Joiner B. Averi Kilty, PT, DPT  Acute Rehabilitation #[phone] pager #[phone] office   PT Evaluation $PT Eval Moderate Complexity: 1 Mod PT Treatments $Therapeutic Activity: 8-22 mins        06/29/2018, 9:18 PM

## 2018-06-29 NOTE — Progress Notes (Signed)
CSW continues to follow pt for further needs at this time. Pt has been extubated to nasal cannula at this time. CSW will continue to follow for further needs. CSW did received report from Tammy with [facility] that if pt returns to the facility-pt would need a new PASAR as pt's old one has expired. CSW advised Tammy that CSW would follow and make note so that PASAR could be resubmitted once pt is more stable and alert.     Claude Manges Kymberly Blomberg, MSW, LCSW-A Emergency Department Clinical Social Worker [phone]

## 2018-06-29 NOTE — Evaluation (Signed)
Clinical/Bedside Swallow Evaluation Patient Details  Name: [name] MRN: [id number] Date of Birth: 14-Apr-1956  Today's Date: 06/29/2018 Time: SLP Start Time (ACUTE ONLY): 0350 SLP Stop Time (ACUTE ONLY): 0901 SLP Time Calculation (min) (ACUTE ONLY): 24 min  Past Medical History:  Past Medical History:  Diagnosis Date  . Acute right MCA stroke (HCC) 11/07/10  . Atrial fibrillation (HCC)    a. s/p TEE-DCCV 02/2104; b. Xarelto started  . CAD (coronary artery disease)    a.  cath 09/2010: LAD stent patent, S-Int/dCFX ok, S-PDA ok, L-LAD atretic;  b. Lexiscan Myoview (02/2014):  no ischemia, EF 55%; c. 11/2014 Cath/PCI: LM nl, LAD 20pISR, LCX 80-7m, OM1 nl, RI 70p, RCA 40-48m, RPDA 95ost/95-27m (PTCA only w/ reduction to 50p/67m), 60d, L->LAD atretic, VG->RI->OM nl, VG->PDA 100p.  . Cardiomyopathy with EF 40% at TEE 02/17/14 (likely tachycardia mediated - Myoview 02/19/14 neg for ischemia with normal EF) 02/18/2014  . COPD (chronic obstructive pulmonary disease) (HCC)   . Depression   . Eczema   . GERD (gastroesophageal reflux disease)   . Headache   . HLD (hyperlipidemia)   . Homelessness 11/12/2011  . 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 Hx of CABG   . Hx of transesophageal echocardiography (TEE) for monitoring 11/2010   TEE 11/2010: EF 60-65%, BAE, trivial atrial septal shunt;  right heart cath in 10/2010 with elevated R and L heart pressures and diuretic started  . Hypertension   . Hypothyroidism   . Persistent atrial fibrillation 10/2014  . Pulmonary nodules    repeat CT due in 11/2011  . Sleep apnea    recent sleep study  in 04/2014 per chart review  shows no significant OSA   Past Surgical History:  Past Surgical History:  Procedure Laterality Date  . BLADDER SURGERY    . CARDIAC CATHETERIZATION  2012  . CARDIOVERSION N/A 02/17/2014   Procedure: CARDIOVERSION;  Surgeon: Pricilla Riffle, MD;   Location: [facility] ENDOSCOPY;  Service: Cardiovascular;  Laterality: N/A;  . CARDIOVERSION N/A 09/20/2016   Procedure: CARDIOVERSION;  Surgeon: Lars Masson, MD;  Location: [facility] ENDOSCOPY;  Service: Cardiovascular;  Laterality: N/A;  . CHEST WALL RECONSTRUCTION    . CORONARY ARTERY BYPASS GRAFT    . debriment for infection in chest    . HERNIA REPAIR    . KNEE ARTHROSCOPY Bilateral   . LEFT AND RIGHT HEART CATHETERIZATION WITH CORONARY ANGIOGRAM N/A 11/10/2014   Procedure: LEFT AND RIGHT HEART CATHETERIZATION WITH CORONARY ANGIOGRAM;  Surgeon: Marykay Lex, MD;  Location: [facility] CATH LAB;  Service: Cardiovascular;  Laterality: N/A;  . Left mastoidectomy    . TEE WITHOUT CARDIOVERSION N/A 02/17/2014   Procedure: TRANSESOPHAGEAL ECHOCARDIOGRAM (TEE);  Surgeon: Pricilla Riffle, MD;  Location: [facility] ENDOSCOPY;  Service: Cardiovascular;  Laterality: N/A;   HPI:  62 year old female hx CVA, COPD, GERD, who resides in SNF after recent ICH / IVH with L sided weakness. Now presenting with hypoxia and PE. Started on heparin infusion. No h/o dysphagia documented but pt reports a h/o needing thickened liquids. BSE completed this admission on 10/10 with slight cough/throat clear not directly indicative of decreased airway protection. Regular diet/thin liquids recommended; SLP f/u x1 and s/o. On 10/14 pt became progressively lethargic with tachypnea and hypoxia; she was transferred back to ICU and intubated 10/14; one-way extubation 10/20.  Assessment / Plan / Recommendation Clinical Impression  Pt is alert but slow to respond; vocal quality is strong considering length of intubation, although it is mildly raspy. Pt describes a h/o using thickener in her drinks ("after my stroke several years ago"). She has multiple subswallows per bolus today, not described during initial BSE prior to intubation, and she also does not appear to follow commands quite as consistently, as she does not cough or swallow to command, nor does she  follow commands for a full oral motor exam. Cognitively, she does not drink 3 oz of water consecutively to better screen for aspiration risk, but there is a delayed cough response after SLP left the room. Recommend proceeding with MBS to better assess orophrayngeal function. Would keep NPO except for essential meds in puree pending completion, scheduled for later this morning. SLP Visit Diagnosis: Dysphagia, oropharyngeal phase (R13.12)    Aspiration Risk  Mild aspiration risk    Diet Recommendation NPO except meds   Medication Administration: Crushed with puree    Other  Recommendations Oral Care Recommendations: Oral care QID   Follow up Recommendations Skilled Nursing facility      Frequency and Duration            Prognosis Prognosis for Safe Diet Advancement: Good      Swallow Study   General HPI: 62 year old female hx CVA, COPD, GERD, who resides in SNF after recent ICH / IVH with L sided weakness. Now presenting with hypoxia and PE. Started on heparin infusion. No h/o dysphagia documented but pt reports a h/o needing thickened liquids. BSE completed this admission on 10/10 with slight cough/throat clear not directly indicative of decreased airway protection. Regular diet/thin liquids recommended; SLP f/u x1 and s/o. On 10/14 pt became progressively lethargic with tachypnea and hypoxia; she was transferred back to ICU and intubated 10/14; one-way extubation 10/20. Type of Study: Bedside Swallow Evaluation Previous Swallow Assessment: see HPI Diet Prior to this Study: NPO Temperature Spikes Noted: No Respiratory Status: Nasal cannula History of Recent Intubation: Yes Length of Intubations (days): 7 days Date extubated: 06/28/18 Behavior/Cognition: Alert;Cooperative;Pleasant mood Oral Cavity Assessment: Within Functional Limits Oral Care Completed by SLP: Yes Oral Cavity - Dentition: Adequate natural dentition Self-Feeding Abilities: Needs assist Patient Positioning:  Upright in bed Baseline Vocal Quality: Other (comment)(mildly raspy) Volitional Cough: Cognitively unable to elicit Volitional Swallow: Unable to elicit    Oral/Motor/Sensory Function Overall Oral Motor/Sensory Function: Mild impairment Facial ROM: Reduced left;Suspected CN VII (facial) dysfunction Facial Symmetry: Abnormal symmetry left;Suspected CN VII (facial) dysfunction Lingual ROM: (does not protrude tongue)   Ice Chips Ice chips: Within functional limits Presentation: Spoon   Thin Liquid Thin Liquid: Impaired Presentation: Cup;Spoon;Straw Pharyngeal  Phase Impairments: Multiple swallows;Cough - Delayed    Nectar Thick Nectar Thick Liquid: Not tested   Honey Thick Honey Thick Liquid: Not tested   Puree Puree: Impaired Presentation: Spoon Pharyngeal Phase Impairments: Multiple swallows;Cough - Delayed   Solid     Solid: Not tested      Maxcine Ham 06/29/2018,9:17 AM  Maxcine Ham, M.A. CCC-SLP Acute Herbalist [phone] Office [phone]

## 2018-06-29 NOTE — Progress Notes (Signed)
NAME:  [name], MRN:  [id number], DOB:  [birth year], LOS: 12 ADMISSION DATE:  06/17/2018, CONSULTATION DATE:  06/17/2018 REFERRING MD:  Dr. Lockie Mola, CHIEF COMPLAINT:  PE   Brief History   62 year old female hx CVA, COPD, who resides in SNF after recent ICH / IVH with L sided weakness. Adm 10/9  with hypoxia and PE. Started on heparin infusion.   10/14 AM patient became progressively lethargic with tachypnea and hypoxia. 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 10/14: Critical care reconsulted for respiratory distress 10/20: one way extubation  Consults: date of consult/date signed off & final recs:  PCCM 10/9 > Speech 10/21>  Procedures (surgical and bedside):  ETT10/14 >> 10/20  Significant Diagnostic Tests:  10/9 CTA PE > Positive for acute PE primarily within the right pulmonary arterial system with CT evidence of right heart strain (RV/LV Ratio is greater than 1). Mild alveolar edema is noted particularly in the right upper lobe. Small effusions and bibasilar dependent atelectatic changes. Aortic Atherosclerosis and Emphysema  Head CT 10/9 >> IVH resolved, chronic right MCA infarct, no acute abnormality Echocardiogram 10/10 > nml LVSF ,grade 2 diastolic dysfunction.  Micro Data:  BCx 2 10/9 >>  ng UC 10/9 > Klebsiella Pneumoniae  Blood 10/14 > coag neg staph 1/2 >> favor contaminant resp 10/14 >> pseudomonas   Antimicrobials:  Ceftriaxone 10/9 > 10/14  Cefepime 10/14 >> 10/20 Flagyl 1014/ >10/16 Vanc 06/23/09/17  Subjective:    Remains critically ill, now extubated, relatively calm neo gtt tapered off x24hrs, Off precedex x32hrs AF-RVR on monitor with HR 140s early AM 10/21, but rate controlled since 0500   Objective   Blood pressure 122/78, pulse 75, temperature 97.8 F (36.6 C), temperature source Oral, resp. rate (!) 28, height 5\' 3"  (1.6 m), weight 62.5 kg, SpO2 96 %.        Intake/Output Summary (Last 24 hours) at 06/29/2018 1419 Last data filed at 06/29/2018 1100 Gross per 24 hour  Intake 365 ml  Output 2275 ml  Net -1910 ml   Filed Weights   06/26/18 0500 06/28/18 0500 06/29/18 0500  Weight: 69 kg 65 kg 62.5 kg    Examination:  Elderly woman, chronically ill-appearing, calm and communicative, delayed responses 1+ edema No pallor/icterus, no petechia on skin Course but present breath sounds diffusely Soft and nontender abdomen Grossly nonfocal, follows commands, some weakness assymetrically  Chest x-ray from 10/20 personally reviewed which shows remarkable improvement in bilateral airspace disease and effusions  Resolved Hospital Problem list   Klebsiella urinary tract infection, completed 5 days of ceftriaxone  Assessment & Plan:   Acute hypoxic respiratory failure.  Suspect aspiration pneumonia with small effusions. Now extubated x24hrs, completed cefepime course Plan: DNI Continue respiratory support as needed  Pulmonary embolus Plan On treatment dose Lovenox , given  history of IVH, not sure what would be the best anticoagulation for her ? eliquis  Acute metabolic encephalopathy superimposed on H/O Right MCA with residual L sided weakness, IVH July 2019, Dementia  -Ammonia 30 on 10/14 -CT head 10/13 Negative  for hemorrhage, was rechecked given IV heparin seroquel titrated up to 150mg  BID Now off precedex >24hrs Plan Will consider Seroquel taper now post extubation  Atrial fibrillation w/o RVR- 1x prn metoprolol needed 10/20 for rate control Plan Cont low-dose prn  metoprolol via tube with IV Lopressor for heart rate more than 120 On dilt drip, most recent rate 10 Scheduled 12.5 metoprolol BID  History of coronary artery disease, grade 2 diastolic dysfunction and prior Coronary artery bypass grafting, hyperlipidemia and hypertension Plan Continue telemetry monitoring  AKI: Resolved (now down 5.5kg this admission) Plan  cont lasix 20 BID for negative balance with clearing of effusions. Cont Replete hypokalemia, hypophosphatemia and hypomagnesemia as needed  ANEMIA: Hgb mids 7s/8s since admission, down from last month baseline >11 -will continue to trend daily, likely transfusion threshold <7.  HCAP with Pseudomonas -treated with cefepime until 10/20, now extubated  Dysphagia  Protein calorie malnutrition Plan SLP consulted for swallow eval, advised dysphagia 1 diet  Constipation: mild belly exam but no stool production PLAN Cont Colace BID  Pressure injuries left heel and left elbow -Wound Care Following   Disposition / Summary of Today's Plan 06/29/18   As per prior discussion with son/patient, s/p extubation she is full DNR  Paged [facility] ([facility]) about assuming care am of 10/22, accepted.  We will transfer to stepdown now and CCM will remain primary until am.     Code Status: DNR Family Communication: Liz Malady updated  Labs   CBC: Recent Labs  Lab 06/25/18 0547 06/26/18 0518 06/27/18 0658 06/28/18 0500 06/29/18 0911  WBC 6.8 5.4 7.5 5.0 6.1  HGB 8.5* 7.6* 8.3* 7.4* 9.0*  HCT 26.6* 25.1* 26.9* 24.1* 28.8*  MCV 97.4 98.0 98.5 98.8 99.3  PLT 220 212 213 126* 181    Basic Metabolic Panel: Recent Labs  Lab 06/23/18 0301 06/24/18 0314 06/25/18 0547 06/26/18 0518 06/27/18 0658 06/28/18 0500 06/29/18 0911  NA 138 136 134* 133* 136 137 138  K 2.9* 3.1* 3.3* 3.5 3.3* 3.1* 3.6  CL 110 104 100 100 102 98 96*  CO2 20* 22 26 27 26 30 31   GLUCOSE 90 95 119* 91 106* 123* 107*  BUN 10 8 12 13 14 16 13   CREATININE  1.10* 0.99 0.84 0.75 0.74 0.75 0.76  CALCIUM 7.3* 7.3* 7.7* 7.8* 7.8* 8.1* 8.9  MG 1.3* 1.9 1.4* 1.7 1.7  --   --   PHOS 1.5* 3.2 2.5 1.6* 2.9  --   --    GFR: Estimated Creatinine Clearance: 61.9 mL/min (by C-G formula based on SCr of 0.76 mg/dL). Recent Labs  Lab 06/26/18 0518 06/27/18 0658 06/28/18 0500 06/29/18 0911  WBC 5.4 7.5 5.0 6.1    Liver Function Tests: Recent Labs  Lab 06/23/18 0301  AST 18  ALT 8  ALKPHOS 51  BILITOT 0.6  PROT 4.5*  ALBUMIN 2.2*   No results for input(s): LIPASE, AMYLASE in the last 168 hours. No results for input(s): AMMONIA in the last 168 hours.  ABG    Component Value Date/Time   PHART 7.265 (L) 06/22/2018 1237   PCO2ART 45.3 06/22/2018 1237   PO2ART 345.0 (H) 06/22/2018 1237   HCO3 20.7 06/22/2018 1237   TCO2 22 06/22/2018 1237   ACIDBASEDEF 6.0 (H) 06/22/2018 1237   O2SAT 100.0 06/22/2018 1237     Coagulation Profile: No results for input(s): INR, PROTIME in the last 168 hours.  Cardiac Enzymes: No results for input(s): CKTOTAL, CKMB, CKMBINDEX, TROPONINI in the  last 168 hours.  HbA1C: Hgb A1c MFr Bld  Date/Time Value Ref Range Status  03/21/2018 06:50 AM 5.1 4.8 - 5.6 % Final    Comment:    (NOTE) Pre diabetes:          5.7%-6.4% Diabetes:              >6.4% Glycemic control for   <7.0% adults with diabetes   02/11/2011 06:00 AM  <5.7 % Final   5.4 (NOTE)                                                                       According to the ADA Clinical Practice Recommendations for 2011, when HbA1c is used as a screening test:   >=6.5%   Diagnostic of Diabetes Mellitus           (if abnormal result  is confirmed)  5.7-6.4%   Increased risk of developing Diabetes Mellitus  References:Diagnosis and Classification of Diabetes Mellitus,Diabetes Care,2011,34(Suppl 1):S62-S69 and Standards of Medical Care in         Diabetes - 2011,Diabetes Care,2011,34  (Suppl 1):S11-S61.    CBG: Recent Labs  Lab 06/28/18 1926  06/29/18 0006 06/29/18 0342 06/29/18 0709 06/29/18 1133  GLUCAP 73 80 82 72 78     Marthenia Rolling, DO  PGY2 [address] [place] 06/29/2018 2:19 PM     06/29/2018

## 2018-06-29 NOTE — Progress Notes (Signed)
Modified Barium Swallow Progress Note  Patient Details  Name: [name] MRN: [id number] Date of Birth: 1956-04-23  Today's Date: 06/29/2018  Modified Barium Swallow completed.  Full report located under Chart Review in the Imaging Section.  Brief recommendations include the following:  Clinical Impression  Pt presents with a moderate oral and mild pharyngeal dysphagia. Her mentation is brighter than it was even this morning, with quicker responses and more prolonged utterances. She has oral holding of all textures, requiringMax faded to Mod verbal cueing to initiated posterior transit. She has piecemeal swallowing, consistently swallowing boluses in two parts, and needing consistent cueing to swallow both parts. Across the study, her timing did begin to improve with cueing. Her pharyngeal phase demonstrated good timing and and strength across consistencies, although she did have a single episode of premature spillage into the airway while consuming thin liquids via straw. When she did aspirate, it was sensed, triggering a cough response with only a second or two of delay. Recommend starting with Dys 1 diet and thin liquids via cup. Prognosis is good for advancement with additional time post-extubation, clearing of mental status, and reintroduction of PO diet. SLP will f/u for tolerance and ready to advance as swallow becomes swifter.   Swallow Evaluation Recommendations       SLP Diet Recommendations: Dysphagia 1 (Puree) solids;Thin liquid   Liquid Administration via: Cup;No straw   Medication Administration: Whole meds with puree   Supervision: Staff to assist with self feeding;Full supervision/cueing for compensatory strategies   Compensations: Minimize environmental distractions;Slow rate;Small sips/bites   Postural Changes: Seated upright at 90 degrees   Oral Care Recommendations: Oral care BID        Maxcine Ham 06/29/2018,1:21 PM   Maxcine Ham, M.A.  CCC-SLP Acute Herbalist [phone] Office [phone]

## 2018-06-29 NOTE — Progress Notes (Signed)
Attending:    Subjective: Resting comfortably Wants to eat  Objective: Vitals:   06/29/18 0858 06/29/18 0900 06/29/18 1000 06/29/18 1100  BP:  (!) 140/91 107/84 122/78  Pulse:  (!) 59 72 75  Resp:  (!) 29 (!) 26 (!) 28  Temp:    97.8 F (36.6 C)  TempSrc:    Oral  SpO2: 100% 100% 94% 96%  Weight:      Height:          Intake/Output Summary (Last 24 hours) at 06/29/2018 1158 Last data filed at 06/29/2018 1100 Gross per 24 hour  Intake 395 ml  Output 3075 ml  Net -2680 ml    General:  Resting comfortably in bed HENT: NCAT OP clear PULM: CTA B, normal effort CV: Irreg irregR, no mgr GI: BS+, soft, nontender MSK: normal bulk and tone Neuro: awake, alert, slow speech but clear, R hemiparalysis    CBC    Component Value Date/Time   WBC 6.1 06/29/2018 0911   RBC 2.90 (L) 06/29/2018 0911   HGB 9.0 (L) 06/29/2018 0911   HGB 12.8 09/13/2016 1315   HCT 28.8 (L) 06/29/2018 0911   HCT 37.9 09/13/2016 1315   PLT 181 06/29/2018 0911   PLT 201 09/13/2016 1315   MCV 99.3 06/29/2018 0911   MCV 95 09/13/2016 1315   MCH 31.0 06/29/2018 0911   MCHC 31.3 06/29/2018 0911   RDW 16.7 (H) 06/29/2018 0911   RDW 14.4 09/13/2016 1315   LYMPHSABS 0.9 06/17/2018 1515   LYMPHSABS 1.6 09/13/2016 1315   MONOABS 0.4 06/17/2018 1515   EOSABS 0.2 06/17/2018 1515   EOSABS 0.2 09/13/2016 1315   BASOSABS 0.0 06/17/2018 1515   BASOSABS 0.0 09/13/2016 1315    BMET    Component Value Date/Time   NA 138 06/29/2018 0911   NA 144 03/27/2017 1000   K 3.6 06/29/2018 0911   CL 96 (L) 06/29/2018 0911   CO2 31 06/29/2018 0911   GLUCOSE 107 (H) 06/29/2018 0911   BUN 13 06/29/2018 0911   BUN 22 03/27/2017 1000   CREATININE 0.76 06/29/2018 0911   CREATININE 1.07 03/31/2015 1306   CALCIUM 8.9 06/29/2018 0911   GFRNONAA >60 06/29/2018 0911   GFRNONAA 58 (L) 03/31/2015 1306   GFRAA >60 06/29/2018 0911   GFRAA 67 03/31/2015 1306     Impression/Plan: Acute respiratory failure with  hypoxemia: resolve Dysphagia: MBS now, keep NPO in meantime Pseudomonas pneumonia: completed cefepime, monitor for fever PE: start eliquis History of ICH: monitor carefully on eliquis CVA history PT consult  Move to [facility] service   Heber [place], MD [facility] Pager: [phone] Cell: [phone] After 3pm or if no response, call [phone]

## 2018-06-30 LAB — BASIC METABOLIC PANEL
Anion gap: 8 (ref 5–15)
BUN: 15 mg/dL (ref 6–20)
CO2: 31 mmol/L (ref 22–32)
CREATININE: 0.9 mg/dL (ref 0.44–1.00)
Calcium: 8.1 mg/dL — ABNORMAL LOW (ref 8.9–10.3)
Chloride: 98 mmol/L (ref 98–111)
GFR calc Af Amer: >60 mL/min (ref >60)
GFR calc non Af Amer: >60 mL/min (ref >60)
Glucose, Bld: 97 mg/dL (ref 70–99)
Potassium: 3.2 mmol/L — ABNORMAL LOW (ref 3.5–5.1)
SODIUM: 137 mmol/L (ref 135–145)

## 2018-06-30 LAB — PREPARE RBC (CROSSMATCH)

## 2018-06-30 LAB — CBC
HEMATOCRIT: 23.1 % — AB (ref 36.0–46.0)
HEMATOCRIT: 25.4 % — AB (ref 36.0–46.0)
HEMOGLOBIN: 7.6 g/dL — AB (ref 12.0–15.0)
Hemoglobin: 7.1 g/dL — ABNORMAL LOW (ref 12.0–15.0)
MCH: 30.3 pg (ref 26.0–34.0)
MCH: 29.6 pg (ref 26.0–34.0)
MCHC: 30.7 g/dL (ref 30.0–36.0)
MCHC: 29.9 g/dL — AB (ref 30.0–36.0)
MCV: 98.7 fL (ref 80.0–100.0)
MCV: 98.8 fL (ref 80.0–100.0)
Platelets: 129 10*3/uL — ABNORMAL LOW (ref 150–400)
Platelets: 150 10*3/uL (ref 150–400)
RBC: 2.34 MIL/uL — ABNORMAL LOW (ref 3.87–5.11)
RBC: 2.57 MIL/uL — ABNORMAL LOW (ref 3.87–5.11)
RDW: 17.1 % — ABNORMAL HIGH (ref 11.5–15.5)
RDW: 16.9 % — ABNORMAL HIGH (ref 11.5–15.5)
WBC: 4.4 10*3/uL (ref 4.0–10.5)
WBC: 3.7 10*3/uL — AB (ref 4.0–10.5)
nRBC: 0 % (ref 0.0–0.2)
nRBC: 0 % (ref 0.0–0.2)

## 2018-06-30 LAB — GLUCOSE, CAPILLARY
GLUCOSE-CAPILLARY: 89 mg/dL (ref 70–99)
GLUCOSE-CAPILLARY: 92 mg/dL (ref 70–99)
Glucose-Capillary: 102 mg/dL — ABNORMAL HIGH (ref 70–99)
Glucose-Capillary: 89 mg/dL (ref 70–99)
Glucose-Capillary: 92 mg/dL (ref 70–99)
Glucose-Capillary: 86 mg/dL (ref 70–99)
Glucose-Capillary: 83 mg/dL (ref 70–99)

## 2018-06-30 MED ORDER — SODIUM CHLORIDE 0.9 % IV BOLUS
500.0000 mL | Freq: Once | INTRAVENOUS | Status: AC
  Administered 2018-06-30: 500 mL via INTRAVENOUS

## 2018-06-30 MED ORDER — SODIUM CHLORIDE 0.9 % IV BOLUS
250.0000 mL | Freq: Once | INTRAVENOUS | Status: AC
  Administered 2018-06-30: 250 mL via INTRAVENOUS

## 2018-06-30 MED ORDER — POTASSIUM CHLORIDE 10 MEQ/100ML IV SOLN
10.0000 meq | INTRAVENOUS | Status: DC
  Administered 2018-06-30 (×3): 10 meq via INTRAVENOUS
  Filled 2018-06-30 (×3): qty 100

## 2018-06-30 MED ORDER — QUETIAPINE FUMARATE 25 MG PO TABS
25.0000 mg | ORAL_TABLET | Freq: Two times a day (BID) | ORAL | Status: DC
  Administered 2018-06-30 – 2018-07-04 (×9): 25 mg via ORAL
  Filled 2018-06-30 (×10): qty 1

## 2018-06-30 MED ORDER — METOPROLOL TARTRATE 5 MG/5ML IV SOLN
5.0000 mg | Freq: Four times a day (QID) | INTRAVENOUS | Status: DC
  Administered 2018-06-30 – 2018-07-01 (×3): 5 mg via INTRAVENOUS
  Filled 2018-06-30 (×3): qty 5

## 2018-06-30 MED ORDER — POTASSIUM CHLORIDE 10 MEQ/100ML IV SOLN
INTRAVENOUS | Status: AC
  Administered 2018-06-30: 10 meq
  Filled 2018-06-30: qty 100

## 2018-06-30 MED ORDER — ENSURE ENLIVE PO LIQD
237.0000 mL | Freq: Two times a day (BID) | ORAL | Status: DC
  Administered 2018-06-30 – 2018-07-03 (×5): 237 mL via ORAL

## 2018-06-30 MED ORDER — SODIUM CHLORIDE 0.9% IV SOLUTION
Freq: Once | INTRAVENOUS | Status: DC
  Administered 2018-06-30: 16:00:00 via INTRAVENOUS

## 2018-06-30 NOTE — Progress Notes (Signed)
  Speech Language Pathology Treatment: Dysphagia  Patient Details Name: [name] MRN: [id number] DOB: April 21, 1956 Today's Date: 06/30/2018 Time: 8921-1941 SLP Time Calculation (min) (ACUTE ONLY): 10 min  Assessment / Plan / Recommendation Clinical Impression  Pt is slower to respond this morning compared to yesterday afternoon, although swallow function appears grossly consistent with MBS results. Multiple swallows are likely suggestive of piecemeal swallowing; she has no overt signs of aspiration. Oral holding continues to require Mod cues. Recommend continuing current diet and precautions for now (straw removed from room). Will f/u for readiness to advance solids as mentation and timing of oral phase improve.   HPI HPI: 62 year old female hx CVA, COPD, GERD, who resides in SNF after recent ICH / IVH with L sided weakness. Now presenting with hypoxia and PE. Started on heparin infusion. No h/o dysphagia documented but pt reports a h/o needing thickened liquids. BSE completed this admission on 10/10 with slight cough/throat clear not directly indicative of decreased airway protection. Regular diet/thin liquids recommended; SLP f/u x1 and s/o. On 10/14 pt became progressively lethargic with tachypnea and hypoxia; she was transferred back to ICU and intubated 10/14; one-way extubation 10/20.      SLP Plan  Continue with current plan of care       Recommendations  Diet recommendations: Dysphagia 1 (puree);Thin liquid Liquids provided via: Cup;No straw Medication Administration: Whole meds with puree Supervision: Staff to assist with self feeding;Full supervision/cueing for compensatory strategies Compensations: Minimize environmental distractions;Slow rate;Small sips/bites Postural Changes and/or Swallow Maneuvers: Seated upright 90 degrees;Upright 30-60 min after meal                Oral Care Recommendations: Oral care BID Follow up Recommendations: Skilled Nursing facility SLP  Visit Diagnosis: Dysphagia, oropharyngeal phase (R13.12) Plan: Continue with current plan of care       GO                Maxcine Ham 06/30/2018, 10:14 AM  Maxcine Ham, M.A. CCC-SLP Acute Herbalist [phone] Office [phone]

## 2018-06-30 NOTE — Significant Event (Addendum)
Rapid Response Event Note  Received a call from 2W RN about patient being hypotensive, RN informed me that patient was transferred out of the ICU earlier in the evening. BP were being taken in the legs, per RN patient was not as alert as before when she arrived.   I was with another patient with a medical emergency. I asked the RN to start 250cc NS bolus and RN was calling [facility] MD as well.   RN called me back at 0037 and informed me that patient's BP had not improved, I asked that they give another 250cc bolus.   When I arrived to the bedside with PCCM MD, Patient's BP was 80/66 (70). Patient was awake and following commands. Patient received 1L NS bolus total.   Patient received Lasix prior to leaving the ICU and after arriving to 2W received 12.5mg  Lopressor PO, patient was also on Cardizem drip that was stopped. HR was in the 80s AF.  Will treat patient's symptoms as needed.   Initial Call 1215 End Time 0215

## 2018-06-30 NOTE — Progress Notes (Signed)
[facility] TEAM 1 - Stepdown/ICU TEAM  LORALEI RADCLIFFE  PPI:[id number] DOB: 07-17-1956 DOA: 06/17/2018 PCP: Burnis Medin, MD    Brief Narrative:  62 year old female w/ a hx of tobacco abuse, dementia, sleep apnea, systolic CHF, HTN, Hypothyroidism, CVA, and COPD who resides in a SNF after a recent ICH / IVH resulting in L sided weakness who presented with hypoxia and and was found to have PE.    Significant Events: 10/9 admit 10/11 [facility] assumed care  Subjective: Pt is awake, but is confused. She is not able to provide a ROS. There is no apparent resp distress or uncontrolled pain, though she is tachycardic at 100-115.   Assessment & Plan:  Acute R PE - increased RV/LV ratio - Acute Hypoxic resp failure  off anticoagulation since ICH in July - lytic tx was not indicated - presently stable on lovenox   Pseudomonas PNA Completed course of cefepime - afebrile w/ normal WBC   Obtundation / Delirium MS has waxed and wained over her prolonged hospital course - her current delirium is likely due to her prolonged hypotension last night - follow clinically for now - if she does not improve over the next 24-48hrs a more extensive eval may be required   Atrial fibrillation with RVR cardizem gtt stopped early this morning due to hypotension - pt now tachycardic, but BP also marginal - may have to consider amio if this persists   Klebsiella urinary tract infection tx completed   AKI Resolved   Persistent normocytic anemia Likely related to smoldering critical illness - w/ hx of CAD need Hgb > 8.0 - transfuse 1U PRBC today and follow   Urinary retention plan to attempt to remove foley again when MS improves   COPD without acute exacerbation Using Xopenex so as not to exacerbate RVR - no acute exacerbation   CAD with history of CABG  Hyperlipidemia  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 rate (!) 29, height '5\' 3"'  (1.6 m), weight 67.6 kg, SpO2 99 %.  Intake/Output Summary (Last 24 hours) at 06/30/2018 1428 Last data filed at 06/30/2018 0855 Gross per 24 hour  Intake 589.37 ml  Output 1050 ml  Net -460.63 ml   Filed Weights   06/28/18 0500 06/29/18 0500 06/29/18 2131  Weight: 65 kg 62.5 kg 67.6 kg    Examination: General: NAD - alert but delirious  Lungs: CTA B - no wheezing or crackles  Cardiovascular: tachycardic - irreg - no M  Abdomen: NT/ND, soft, bs+, no mass  Extremities: trace B LE edema w/o cyanosis   CBC: Recent Labs  Lab 06/29/18 0911 06/30/18 0349 06/30/18 1006  WBC 6.1 4.4 3.7*  HGB 9.0* 7.1* 7.6*  HCT 28.8* 23.1* 25.4*  MCV 99.3 98.7 98.8  PLT 181 129* 063   Basic Metabolic Panel: Recent Labs  Lab 06/25/18 0547 06/26/18 0518 06/27/18 0658 06/28/18 0500 06/29/18 0911 06/30/18 0349  NA 134* 133* 136 137 138 137  K 3.3* 3.5 3.3* 3.1* 3.6 3.2*  CL 100 100  102 98 96* 98  CO2 '26 27 26 30 31 31  ' GLUCOSE 119* 91 106* 123* 107* 97  BUN '12 13 14 16 13 15  ' CREATININE 0.84 0.75 0.74 0.75 0.76 0.90  CALCIUM 7.7* 7.8* 7.8* 8.1* 8.9 8.1*  MG 1.4* 1.7 1.7  --   --   --   PHOS 2.5 1.6* 2.9  --   --   --    GFR: Estimated Creatinine Clearance: 61.4 mL/min (by C-G formula based on SCr of 0.9 mg/dL).  Liver Function Tests: No results for input(s): AST, ALT, ALKPHOS, BILITOT, PROT, ALBUMIN in the last 168 hours.  HbA1C: Hgb A1c MFr Bld  Date/Time Value Ref Range Status  03/21/2018 06:50 AM 5.1 4.8 - 5.6 % Final    Comment:    (NOTE) Pre diabetes:          5.7%-6.4% Diabetes:               >6.4% Glycemic control for   <7.0% adults with diabetes   02/11/2011 06:00 AM  <5.7 % Final   5.4 (NOTE)                                                                       According to the ADA Clinical Practice Recommendations for 2011, when HbA1c is used as a screening test:   >=6.5%   Diagnostic of Diabetes Mellitus           (if abnormal result  is confirmed)  5.7-6.4%   Increased risk of developing Diabetes Mellitus  References:Diagnosis and Classification of Diabetes Mellitus,Diabetes VXYI,0165,53(ZSMOL 1):S62-S69 and Standards of Medical Care in         Diabetes - 2011,Diabetes Care,2011,34  (Suppl 1):S11-S61.    Recent Results (from the past 240 hour(s))  Culture, respiratory (non-expectorated)     Status: None   Collection Time: 06/22/18  5:07 AM  Result Value Ref Range Status   Specimen Description TRACHEAL ASPIRATE  Final   Special Requests NONE  Final   Gram Stain   Final    FEW WBC PRESENT, PREDOMINANTLY PMN RARE SQUAMOUS EPITHELIAL CELLS PRESENT FEW GRAM POSITIVE COCCI IN PAIRS IN CLUSTERS RARE BUDDING YEAST SEEN FEW GRAM POSITIVE RODS Performed at [facility], [place] [address], [place], [place] [address]    Culture MODERATE PSEUDOMONAS AERUGINOSA  Final   Report Status 06/25/2018 FINAL  Final   Organism ID, Bacteria PSEUDOMONAS AERUGINOSA  Final      Susceptibility   Pseudomonas aeruginosa - MIC*    CEFTAZIDIME 4 SENSITIVE Sensitive     CIPROFLOXACIN <=0.25 SENSITIVE Sensitive     GENTAMICIN <=1 SENSITIVE Sensitive     IMIPENEM 1 SENSITIVE Sensitive     PIP/TAZO 8 SENSITIVE Sensitive     CEFEPIME 4 SENSITIVE Sensitive     * MODERATE PSEUDOMONAS AERUGINOSA  Culture, blood (routine x 2)     Status: Abnormal   Collection Time: 06/22/18  9:00 AM  Result Value Ref Range Status   Specimen Description BLOOD LEFT HAND  Final   Special Requests   Final    BOTTLES DRAWN AEROBIC ONLY Blood Culture adequate volume   Culture  Setup Time   Final     AEROBIC BOTTLE ONLY  GRAM POSITIVE COCCI CRITICAL RESULT CALLED TO, READ BACK BY AND VERIFIED WITH: L POWELL PHARMD 06/23/18 2043 JDW    Culture (A)  Final    STAPHYLOCOCCUS SPECIES (COAGULASE NEGATIVE) THE SIGNIFICANCE OF ISOLATING THIS ORGANISM FROM A SINGLE SET OF BLOOD CULTURES WHEN MULTIPLE SETS ARE DRAWN IS UNCERTAIN. PLEASE NOTIFY THE MICROBIOLOGY DEPARTMENT WITHIN ONE WEEK IF SPECIATION AND SENSITIVITIES ARE REQUIRED. Performed at [facility], [place] [address], [place], [place] [address]    Report Status 06/25/2018 FINAL  Final  Blood Culture ID Panel (Reflexed)     Status: Abnormal   Collection Time: 06/22/18  9:00 AM  Result Value Ref Range Status   Enterococcus species NOT DETECTED NOT DETECTED Final   Vancomycin resistance NOT DETECTED NOT DETECTED Final   Listeria monocytogenes NOT DETECTED NOT DETECTED Final   Staphylococcus species DETECTED (A) NOT DETECTED Final    Comment: CRITICAL RESULT CALLED TO, READ BACK BY AND VERIFIED WITH: L POWELL PHARMD 06/23/18 2043 JDW Methicillin (oxacillin) resistant coagulase negative staphylococcus. Possible blood culture contaminant (unless isolated from more than one blood culture draw or clinical case suggests pathogenicity). No antibiotic treatment is indicated for blood  culture contaminants.    Staphylococcus aureus (BCID) NOT DETECTED NOT DETECTED Final   Methicillin resistance DETECTED (A) NOT DETECTED Final    Comment: CRITICAL RESULT CALLED TO, READ BACK BY AND VERIFIED WITH: L POWELL PHARMD 06/23/18 2043 JDW    Streptococcus species NOT DETECTED NOT DETECTED Final   Streptococcus agalactiae NOT DETECTED NOT DETECTED Final   Streptococcus pneumoniae NOT DETECTED NOT DETECTED Final   Streptococcus pyogenes NOT DETECTED NOT DETECTED Final   Acinetobacter baumannii NOT DETECTED NOT DETECTED Final   Enterobacteriaceae species NOT DETECTED NOT DETECTED Final   Enterobacter cloacae complex NOT DETECTED NOT DETECTED Final    Escherichia coli NOT DETECTED NOT DETECTED Final   Klebsiella oxytoca NOT DETECTED NOT DETECTED Final   Klebsiella pneumoniae NOT DETECTED NOT DETECTED Final   Proteus species NOT DETECTED NOT DETECTED Final   Serratia marcescens NOT DETECTED NOT DETECTED Final   Carbapenem resistance NOT DETECTED NOT DETECTED Final   Haemophilus influenzae NOT DETECTED NOT DETECTED Final   Neisseria meningitidis NOT DETECTED NOT DETECTED Final   Pseudomonas aeruginosa NOT DETECTED NOT DETECTED Final   Candida albicans NOT DETECTED NOT DETECTED Final   Candida glabrata NOT DETECTED NOT DETECTED Final   Candida krusei NOT DETECTED NOT DETECTED Final   Candida parapsilosis NOT DETECTED NOT DETECTED Final   Candida tropicalis NOT DETECTED NOT DETECTED Final    Comment: Performed at [facility], [place] [address] [place], [place] [address]  Culture, blood (routine x 2)     Status: None   Collection Time: 06/22/18  9:08 AM  Result Value Ref Range Status   Specimen Description BLOOD RIGHT HAND  Final   Special Requests   Final    BOTTLES DRAWN AEROBIC ONLY Blood Culture adequate volume   Culture   Final    NO GROWTH 5 DAYS Performed at [facility], [place] [address], [place], [place] [address]    Report Status 06/27/2018 FINAL  Final  Culture, respiratory (non-expectorated)     Status: None   Collection Time: 06/22/18 11:38 AM  Result Value Ref Range Status   Specimen Description TRACHEAL ASPIRATE  Final   Special Requests Normal  Final   Gram Stain   Final    RARE WBC PRESENT,BOTH PMN AND MONONUCLEAR NO ORGANISMS SEEN NO  SQUAMOUS EPITHELIAL CELLS PRESENT Performed at [facility], [place] [address] [place], [place] [address]    Culture RARE PSEUDOMONAS AERUGINOSA  Final   Report Status 06/25/2018 FINAL  Final   Organism ID, Bacteria PSEUDOMONAS AERUGINOSA  Final      Susceptibility   Pseudomonas aeruginosa - MIC*    CEFTAZIDIME 4 SENSITIVE Sensitive     CIPROFLOXACIN <=0.25  SENSITIVE Sensitive     GENTAMICIN <=1 SENSITIVE Sensitive     IMIPENEM 1 SENSITIVE Sensitive     PIP/TAZO 8 SENSITIVE Sensitive     CEFEPIME 4 SENSITIVE Sensitive     * RARE PSEUDOMONAS AERUGINOSA     Scheduled Meds: . budesonide (PULMICORT) nebulizer solution  0.5 mg Nebulization BID  . chlorhexidine gluconate (MEDLINE KIT)  15 mL Mouth Rinse BID  . Chlorhexidine Gluconate Cloth  6 each Topical Daily  . docusate  100 mg Oral BID  . enoxaparin (LOVENOX) injection  60 mg Subcutaneous Q12H  . feeding supplement (ENSURE ENLIVE)  237 mL Oral BID BM  . furosemide  20 mg Intravenous Q12H  . mouth rinse  15 mL Mouth Rinse q12n4p  . metoprolol tartrate  12.5 mg Oral BID  . pantoprazole sodium  40 mg Oral Q1200  . povidone-iodine   Topical BID  . QUEtiapine  150 mg Oral BID     LOS: 13 days   Cherene Altes, MD [facility] Office  [phone] Pager - Text Page per Amion  If 7PM-7AM, please contact night-coverage per Amion 06/30/2018, 2:28 PM

## 2018-06-30 NOTE — Progress Notes (Signed)
Pt's right arm restricted, pt's left arm contracted. RT unable to palpate or doppler L radial pulse, attempted L Brachial ABG, but was unable to return blood. Venous gas collected, and ran. Results given to RN. RT will continue to monitor.

## 2018-06-30 NOTE — Progress Notes (Addendum)
2355: blood pressure noted to be 71/36. Patient minimally responsive. Will not answer questions. Only opens eyes to physical stimulation. cardizem drip stopped immediately. Rapid response called and bolus initiated. Please see MAR for complete list of meds administered prior to change in status. Pertinent medications include: lasixs 20mg  IV, 150mg  PO seroquel, 12.5mg  PO metoprolol. Lennox Laity with e-link/PCCM notified of changes in patient status.   0030: BP: 62/42 (46)  0038: BP: 55/27 (35). Puja with Rapid Response called again. Additional bolus infusing. PCCM contacted again. Awaiting physician orders.   0043: CBG 102  0044: respiratory notified of ABG  0045: BP:69/42 (47).   8264: Additional bolus administered per PCCM.   0145: 1L NS total into patient. Most recent BP:61/37(43). Patient is now able to say name and answer short questions. Follow commands (squeeze hands, nose to finger touches, smile). Rapid response and MD at bedside.

## 2018-06-30 NOTE — Progress Notes (Signed)
Nutrition Follow-up  DOCUMENTATION CODES:   Non-severe (moderate) malnutrition in context of chronic illness  INTERVENTION:    Ensure Enlive po BID, each supplement provides 350 kcal and 20 grams of protein  NUTRITION DIAGNOSIS:   Moderate Malnutrition related to chronic illness(dementia, COPD, CKD stage III) as evidenced by mild fat depletion, mild muscle depletion, percent weight loss(12.5% weight loss in 3 months).  Ongoing  GOAL:   Patient will meet greater than or equal to 90% of their needs  Unmet  MONITOR:   PO intake, Supplement acceptance  ASSESSMENT:   62 year old female who presented to the ED on 10/9 from [facility] with cough and SOB. Pt admitted with PE. PMH significant for dementia, CAD, hypertension, hyperlipidemia, COPD, CKD stage III, hypothyroidism, stroke, depression, atrial fibrillation, and GERD.  S/P one-way extubation on 10/20. SLP following for dysphagia. Diet advanced to dysphagia 1 with thin liquids 10/21. Patient consumed 50% of afternoon meal yesterday. Patient had a hypotensive episode late last night. She did not eat much breakfast this morning. Labs and medications reviewed.    Diet Order:   Diet Order            DIET - DYS 1 Room service appropriate? Yes; Fluid consistency: Thin  Diet effective now              EDUCATION NEEDS:   No education needs have been identified at this time  Skin:  Skin Assessment: Skin Integrity Issues: Skin Integrity Issues:: DTI, Unstageable DTI: L elbow Unstageable: L ankle  Last BM:  10/19 (type 6)  Height:   Ht Readings from Last 1 Encounters:  06/22/18 5\' 3"  (1.6 m)    Weight:   Wt Readings from Last 1 Encounters:  06/29/18 67.6 kg    Ideal Body Weight:  54.55 kg  BMI:  Body mass index is 26.39 kg/m.  Estimated Nutritional Needs:   Kcal:  1600-1800  Protein:  80-100 gm  Fluid:  1.7-1.9 L    Joaquin Courts, RD, LDN, CNSC Pager [phone] After Hours Pager  [phone]

## 2018-06-30 NOTE — Progress Notes (Signed)
eLink Physician-Brief Progress Note Patient Name: [name] DOB: 1956-05-19 MRN: [id number]   Date of Service  06/30/2018  HPI/Events of Note  Hypotensive SBP 50 and patient appears more lethargic.  Accucheck 102.  On going fluid bolus with slight improvement in BP.  Diltiazem drip now off.  Patient received metoprolol tartrate 12.5 and Seroquel.  eICU Interventions  Patient has been fluid negative for the past few days, will give additional NS 250 bolus.  Possibly medication effect.     Intervention Category Major Interventions: Hypotension - evaluation and management  Darl Pikes 06/30/2018, 12:50 AM

## 2018-06-30 NOTE — Clinical Social Work Note (Signed)
PASARR pending for return to [facility]. 30 day note filled out and placed on pt's chart for MD to sign. MD notified via page.   [place], [place] [phone]

## 2018-06-30 NOTE — Progress Notes (Signed)
SpO2 not picking up on monitor. Checked with personal pulse ox, SpO2 98% on 2L [place], HR correlating with HR on monitor.

## 2018-06-30 NOTE — Progress Notes (Signed)
ABG results  PH- 7.49 CO2- 42.9 Bicarb- 32.5  Results called to Elita Boone, RN.  BP remain low 52/34 (MAP 37). 750cc bolus given with no-minimal change in BP. BP checked in 3 different extremities.   Will continue to monitor closely. Dorna Leitz, RN 06/30/18 1:23 AM

## 2018-07-01 DIAGNOSIS — K5900 Constipation, unspecified: Secondary | ICD-10-CM

## 2018-07-01 LAB — CBC
HEMATOCRIT: 28.7 % — AB (ref 36.0–46.0)
Hemoglobin: 8.8 g/dL — ABNORMAL LOW (ref 12.0–15.0)
MCH: 29.3 pg (ref 26.0–34.0)
MCHC: 30.7 g/dL (ref 30.0–36.0)
MCV: 95.7 fL (ref 80.0–100.0)
NRBC: 0 % (ref 0.0–0.2)
PLATELETS: 127 10*3/uL — AB (ref 150–400)
RBC: 3 MIL/uL — ABNORMAL LOW (ref 3.87–5.11)
RDW: 18.3 % — AB (ref 11.5–15.5)
WBC: 4.5 10*3/uL (ref 4.0–10.5)

## 2018-07-01 LAB — BLOOD GAS, ARTERIAL
ACID-BASE EXCESS: 8.8 mmol/L — AB (ref 0.0–2.0)
BICARBONATE: 32.5 mmol/L — AB (ref 20.0–28.0)
O2 Content: 2 L/min
O2 Saturation: 45.5 %
Patient temperature: 98.6
pCO2 arterial: 42.9 mmHg (ref 32.0–48.0)
pH, Arterial: 7.492 — ABNORMAL HIGH (ref 7.350–7.450)

## 2018-07-01 LAB — TYPE AND SCREEN
ABO/RH(D): O POS
ANTIBODY SCREEN: NEGATIVE
Unit division: 0

## 2018-07-01 LAB — COMPREHENSIVE METABOLIC PANEL
ALK PHOS: 53 U/L (ref 38–126)
ALT: 10 U/L (ref 0–44)
AST: 19 U/L (ref 15–41)
Albumin: 2.4 g/dL — ABNORMAL LOW (ref 3.5–5.0)
Anion gap: 7 (ref 5–15)
BUN: 17 mg/dL (ref 6–20)
CALCIUM: 8.4 mg/dL — AB (ref 8.9–10.3)
CO2: 32 mmol/L (ref 22–32)
CREATININE: 0.96 mg/dL (ref 0.44–1.00)
Chloride: 99 mmol/L (ref 98–111)
GFR calc non Af Amer: >60 mL/min (ref >60)
Glucose, Bld: 86 mg/dL (ref 70–99)
Potassium: 3.6 mmol/L (ref 3.5–5.1)
Sodium: 138 mmol/L (ref 135–145)
Total Bilirubin: 0.6 mg/dL (ref 0.3–1.2)
Total Protein: 5.5 g/dL — ABNORMAL LOW (ref 6.5–8.1)

## 2018-07-01 LAB — GLUCOSE, CAPILLARY
GLUCOSE-CAPILLARY: 81 mg/dL (ref 70–99)
Glucose-Capillary: 87 mg/dL (ref 70–99)
Glucose-Capillary: 84 mg/dL (ref 70–99)
Glucose-Capillary: 95 mg/dL (ref 70–99)

## 2018-07-01 LAB — BPAM RBC
BLOOD PRODUCT EXPIRATION DATE: 201911252359
ISSUE DATE / TIME: 201910221544
Unit Type and Rh: 5100

## 2018-07-01 LAB — MAGNESIUM: Magnesium: 1.4 mg/dL — ABNORMAL LOW (ref 1.7–2.4)

## 2018-07-01 MED ORDER — POLYETHYLENE GLYCOL 3350 17 G PO PACK
17.0000 g | PACK | Freq: Once | ORAL | Status: AC
  Administered 2018-07-01: 17 g via ORAL
  Filled 2018-07-01: qty 1

## 2018-07-01 MED ORDER — METOPROLOL TARTRATE 5 MG/5ML IV SOLN
2.5000 mg | INTRAVENOUS | Status: DC | PRN
  Administered 2018-07-01: 5 mg via INTRAVENOUS
  Filled 2018-07-01: qty 5

## 2018-07-01 MED ORDER — DILTIAZEM HCL ER 60 MG PO CP12
120.0000 mg | ORAL_CAPSULE | Freq: Two times a day (BID) | ORAL | Status: DC
  Administered 2018-07-01 – 2018-07-04 (×6): 120 mg via ORAL
  Filled 2018-07-01 (×9): qty 2

## 2018-07-01 MED ORDER — DILTIAZEM HCL ER 90 MG PO CP12
90.0000 mg | ORAL_CAPSULE | Freq: Two times a day (BID) | ORAL | Status: DC
  Administered 2018-07-01: 90 mg via ORAL
  Filled 2018-07-01 (×2): qty 1

## 2018-07-01 NOTE — Progress Notes (Addendum)
[place] TEAM 1 - Stepdown/ICU TEAM  [name]  YSA:[id number] DOB: 1956/01/14 DOA: 06/17/2018 PCP: Burnis Medin, MD    Brief Narrative:  62 year old female w/ a hx of tobacco abuse, dementia, sleep apnea, systolic CHF, HTN, Hypothyroidism, CVA, and COPD who resides in a SNF after a recent ICH / IVH resulting in L sided weakness who presented with hypoxia and and was found to have PE.    Significant Events: 10/9 admit 10/11 [facility] assumed care  Subjective: Pt is awake, but is confused.  By nursing, has not had a bowel movement in 4 days.  Still some issues with tachycardia  Assessment & Plan:  Acute R PE - increased RV/LV ratio - Acute Hypoxic resp failure  off anticoagulation since ICH in July - lytic tx was not indicated - presently stable on lovenox   Pseudomonas PNA Completed course of cefepime - afebrile w/ normal WBC   Obtundation / Delirium MS has waxed and wained over her prolonged hospital course - her current delirium is likely due to her prolonged hypotension last night - follow clinically for now -  Atrial fibrillation with RVR cardizem gtt stopped early this morning due to hypotension - pt now tachycardic, but BP also marginal .have changed scheduled IV Lopressor to p.o. Cardizem.  May have to consider amio if this persists   Klebsiella urinary tract infection tx completed   AKI Resolved   Persistent normocytic anemia Likely related to smoldering critical illness - w/ hx of CAD need Hgb > 8.0 - transfuse 1U PRBC today and follow   Constipation: We will try MiraLAX.  Already on Colace  Urinary retention Foley with some leakage.  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 rate 18, height '5\' 3"'  (1.6 m), weight 68.7 kg, SpO2 98 %.  Intake/Output Summary (Last 24 hours) at 07/01/2018 1408 Last data filed at 07/01/2018 1237 Gross per 24 hour  Intake 963 ml  Output 400 ml  Net 563 ml   Filed Weights   06/29/18 0500 06/29/18 2131 07/01/18 0600  Weight: 62.5 kg 67.6 kg 68.7 kg    Examination: General: Responds somewhat to questions, oriented x1, no acute distress Lungs: Clear to auscultation bilaterally Cardiovascular: Regular rhythm, episodic tachycardia Abdomen: Soft, nontender, nondistended, positive bowel sounds Extremities: No clubbing cyanosis or edema  CBC: Recent Labs  Lab 06/30/18 0349 06/30/18 1006 07/01/18 0418  WBC 4.4 3.7* 4.5  HGB 7.1* 7.6* 8.8*  HCT 23.1* 25.4* 28.7*  MCV 98.7 98.8 95.7  PLT 129* 150 323*   Basic Metabolic Panel: Recent Labs  Lab 06/25/18 0547 06/26/18 0518 06/27/18 0658  06/29/18 0911 06/30/18 0349 07/01/18 0418  NA 134* 133* 136   < > 138 137 138  K 3.3* 3.5 3.3*   < > 3.6 3.2* 3.6  CL 100 100 102   < > 96* 98 99  CO2 '26 27 26   ' < > 31 31 32  GLUCOSE 119* 91 106*   < > 107* 97 86  BUN '12 13 14   ' < > '13 15 17  ' CREATININE 0.84 0.75 0.74   < > 0.76 0.90 0.96  CALCIUM 7.7* 7.8* 7.8*   < > 8.9 8.1* 8.4*  MG 1.4* 1.7 1.7  --   --   --  1.4*  PHOS 2.5 1.6* 2.9  --   --   --   --    < > = values in this interval not displayed.   GFR: Estimated Creatinine Clearance: 57.9 mL/min (by C-G formula based on SCr of 0.96 mg/dL).  Liver Function Tests: Recent Labs  Lab 07/01/18 0418  AST 19  ALT 10  ALKPHOS 53    BILITOT 0.6  PROT 5.5*  ALBUMIN 2.4*    HbA1C: Hgb A1c MFr Bld  Date/Time Value Ref Range Status  03/21/2018 06:50 AM 5.1 4.8 - 5.6 % Final    Comment:    (NOTE) Pre diabetes:          5.7%-6.4% Diabetes:              >6.4% Glycemic control for   <7.0% adults with diabetes   02/11/2011 06:00 AM  <5.7 % Final   5.4 (NOTE)                                                                       According to the ADA Clinical Practice Recommendations for 2011, when HbA1c is used as a screening test:   >=6.5%   Diagnostic of Diabetes Mellitus           (if abnormal result  is confirmed)  5.7-6.4%   Increased risk of developing Diabetes Mellitus  References:Diagnosis and Classification of Diabetes Mellitus,Diabetes BVQX,4503,88(EKCMK 1):S62-S69 and Standards of Medical Care in         Diabetes - 2011,Diabetes Care,2011,34  (Suppl 1):S11-S61.    Recent Results (from the past 240 hour(s))  Culture, respiratory (non-expectorated)     Status: None   Collection Time: 06/22/18  5:07 AM  Result Value Ref Range Status   Specimen Description TRACHEAL ASPIRATE  Final   Special Requests NONE  Final   Gram Stain   Final    FEW WBC PRESENT, PREDOMINANTLY PMN RARE SQUAMOUS EPITHELIAL CELLS PRESENT FEW GRAM POSITIVE COCCI IN PAIRS IN CLUSTERS RARE BUDDING YEAST SEEN FEW GRAM POSITIVE RODS Performed at [facility], [place] [address], [place], [place] [address]    Culture MODERATE PSEUDOMONAS AERUGINOSA  Final   Report Status 06/25/2018 FINAL  Final   Organism ID, Bacteria PSEUDOMONAS AERUGINOSA  Final      Susceptibility   Pseudomonas aeruginosa - MIC*    CEFTAZIDIME 4 SENSITIVE Sensitive     CIPROFLOXACIN <=0.25 SENSITIVE Sensitive     GENTAMICIN <=1 SENSITIVE Sensitive     IMIPENEM 1 SENSITIVE Sensitive     PIP/TAZO 8 SENSITIVE Sensitive     CEFEPIME 4 SENSITIVE Sensitive     * MODERATE PSEUDOMONAS AERUGINOSA  Culture, blood (routine x 2)     Status: Abnormal   Collection  Time:  06/22/18  9:00 AM  Result Value Ref Range Status   Specimen Description BLOOD LEFT HAND  Final   Special Requests   Final    BOTTLES DRAWN AEROBIC ONLY Blood Culture adequate volume   Culture  Setup Time   Final    AEROBIC BOTTLE ONLY GRAM POSITIVE COCCI CRITICAL RESULT CALLED TO, READ BACK BY AND VERIFIED WITH: L POWELL PHARMD 06/23/18 2043 JDW    Culture (A)  Final    STAPHYLOCOCCUS SPECIES (COAGULASE NEGATIVE) THE SIGNIFICANCE OF ISOLATING THIS ORGANISM FROM A SINGLE SET OF BLOOD CULTURES WHEN MULTIPLE SETS ARE DRAWN IS UNCERTAIN. PLEASE NOTIFY THE MICROBIOLOGY DEPARTMENT WITHIN ONE WEEK IF SPECIATION AND SENSITIVITIES ARE REQUIRED. Performed at [facility], [place] [address] [place], [place] [address]    Report Status 06/25/2018 FINAL  Final  Blood Culture ID Panel (Reflexed)     Status: Abnormal   Collection Time: 06/22/18  9:00 AM  Result Value Ref Range Status   Enterococcus species NOT DETECTED NOT DETECTED Final   Vancomycin resistance NOT DETECTED NOT DETECTED Final   Listeria monocytogenes NOT DETECTED NOT DETECTED Final   Staphylococcus species DETECTED (A) NOT DETECTED Final    Comment: CRITICAL RESULT CALLED TO, READ BACK BY AND VERIFIED WITH: L POWELL PHARMD 06/23/18 2043 JDW Methicillin (oxacillin) resistant coagulase negative staphylococcus. Possible blood culture contaminant (unless isolated from more than one blood culture draw or clinical case suggests pathogenicity). No antibiotic treatment is indicated for blood  culture contaminants.    Staphylococcus aureus (BCID) NOT DETECTED NOT DETECTED Final   Methicillin resistance DETECTED (A) NOT DETECTED Final    Comment: CRITICAL RESULT CALLED TO, READ BACK BY AND VERIFIED WITH: L POWELL PHARMD 06/23/18 2043 JDW    Streptococcus species NOT DETECTED NOT DETECTED Final   Streptococcus agalactiae NOT DETECTED NOT DETECTED Final   Streptococcus pneumoniae NOT DETECTED NOT DETECTED Final   Streptococcus pyogenes  NOT DETECTED NOT DETECTED Final   Acinetobacter baumannii NOT DETECTED NOT DETECTED Final   Enterobacteriaceae species NOT DETECTED NOT DETECTED Final   Enterobacter cloacae complex NOT DETECTED NOT DETECTED Final   Escherichia coli NOT DETECTED NOT DETECTED Final   Klebsiella oxytoca NOT DETECTED NOT DETECTED Final   Klebsiella pneumoniae NOT DETECTED NOT DETECTED Final   Proteus species NOT DETECTED NOT DETECTED Final   Serratia marcescens NOT DETECTED NOT DETECTED Final   Carbapenem resistance NOT DETECTED NOT DETECTED Final   Haemophilus influenzae NOT DETECTED NOT DETECTED Final   Neisseria meningitidis NOT DETECTED NOT DETECTED Final   Pseudomonas aeruginosa NOT DETECTED NOT DETECTED Final   Candida albicans NOT DETECTED NOT DETECTED Final   Candida glabrata NOT DETECTED NOT DETECTED Final   Candida krusei NOT DETECTED NOT DETECTED Final   Candida parapsilosis NOT DETECTED NOT DETECTED Final   Candida tropicalis NOT DETECTED NOT DETECTED Final    Comment: Performed at [facility], [place] [address], [place], [place] [address]  Culture, blood (routine x 2)     Status: None   Collection Time: 06/22/18  9:08 AM  Result Value Ref Range Status   Specimen Description BLOOD RIGHT HAND  Final   Special Requests   Final    BOTTLES DRAWN AEROBIC ONLY Blood Culture adequate volume   Culture   Final    NO GROWTH 5 DAYS Performed at [facility], [place] [address] [place],  [address]    Report Status 06/27/2018 FINAL  Final  Culture, respiratory (non-expectorated)  Status: None   Collection Time: 06/22/18 11:38 AM  Result Value Ref Range Status   Specimen Description TRACHEAL ASPIRATE  Final   Special Requests Normal  Final   Gram Stain   Final    RARE WBC PRESENT,BOTH PMN AND MONONUCLEAR NO ORGANISMS SEEN NO SQUAMOUS EPITHELIAL CELLS PRESENT Performed at [facility], [address] [place], [place] [address]    Culture RARE PSEUDOMONAS AERUGINOSA  Final    Report Status 06/25/2018 FINAL  Final   Organism ID, Bacteria PSEUDOMONAS AERUGINOSA  Final      Susceptibility   Pseudomonas aeruginosa - MIC*    CEFTAZIDIME 4 SENSITIVE Sensitive     CIPROFLOXACIN <=0.25 SENSITIVE Sensitive     GENTAMICIN <=1 SENSITIVE Sensitive     IMIPENEM 1 SENSITIVE Sensitive     PIP/TAZO 8 SENSITIVE Sensitive     CEFEPIME 4 SENSITIVE Sensitive     * RARE PSEUDOMONAS AERUGINOSA     Scheduled Meds: . budesonide (PULMICORT) nebulizer solution  0.5 mg Nebulization BID  . chlorhexidine gluconate (MEDLINE KIT)  15 mL Mouth Rinse BID  . Chlorhexidine Gluconate Cloth  6 each Topical Daily  . diltiazem  90 mg Oral Q12H  . docusate  100 mg Oral BID  . enoxaparin (LOVENOX) injection  60 mg Subcutaneous Q12H  . feeding supplement (ENSURE ENLIVE)  237 mL Oral BID BM  . mouth rinse  15 mL Mouth Rinse q12n4p  . pantoprazole sodium  40 mg Oral Q1200  . povidone-iodine   Topical BID  . QUEtiapine  25 mg Oral BID     LOS: 14 days   Tina Brod, MD [facility]  To reach me or the doctor on call, go to: [email or web address] Password [email or web address]  If 7PM-7AM, please contact night-coverage per Amion 07/01/2018, 2:08 PM

## 2018-07-02 MED ORDER — METOPROLOL SUCCINATE ER 25 MG PO TB24
12.5000 mg | ORAL_TABLET | Freq: Every day | ORAL | Status: DC
  Administered 2018-07-02 – 2018-07-04 (×3): 12.5 mg via ORAL
  Filled 2018-07-02 (×3): qty 1

## 2018-07-02 NOTE — Consult Note (Signed)
   [facility] CM Inpatient Consult   07/02/2018  AJIA SAGENDORF [birth year] [id number]  Follow up:  Patient awaiting for SNF [long term?].  Went by to speak with patient and she states she feels some better.  She is alert but slow to answer.  She states she is "watching this show on the TV but I think I got the most out of it already."  Will continue to follow for disposition and [facility] Care Management follow up to assure patient is on Medicaid for long term residency.  For questions, please contact:  Charlesetta Shanks, RN BSN CCM [facility]  [phone] business mobile phone Toll free office [phone]

## 2018-07-02 NOTE — Progress Notes (Signed)
  Speech Language Pathology Treatment: Dysphagia  Patient Details Name: [name] MRN: [id number] DOB: [birth year] Today's Date: 07/02/2018 Time: 6606-0045 SLP Time Calculation (min) (ACUTE ONLY): 11 min  Assessment / Plan / Recommendation Clinical Impression  Although pt still has increased processing time, overall her responses are less delayed than during previous visit. She needs assistance with feeding, Mod cues for oral holding, and appears to exhibit piecemeal swallowing. She had one immediate cough following a larger cup sip of thin liquid after a particularly prolonged episode of oral holding, which per MBS, is likely suggestive of airway compromise secondary to premature spillage. Otherwise, no overt signs of aspiration were observed. Recommend to maintain current diet for now. Will continue to follow and assess with advanced solid textures.   HPI HPI: 62 year old female hx CVA, COPD, GERD, who resides in SNF after recent ICH / IVH with L sided weakness. Now presenting with hypoxia and PE. Started on heparin infusion. No h/o dysphagia documented but pt reports a h/o needing thickened liquids. BSE completed this admission on 10/10 with slight cough/throat clear not directly indicative of decreased airway protection. Regular diet/thin liquids recommended; SLP f/u x1 and s/o. On 10/14 pt became progressively lethargic with tachypnea and hypoxia; she was transferred back to ICU and intubated 10/14; one-way extubation 10/20.      SLP Plan  Continue with current plan of care       Recommendations  Diet recommendations: Dysphagia 1 (puree);Thin liquid Liquids provided via: Cup;No straw Medication Administration: Whole meds with puree Supervision: Staff to assist with self feeding;Full supervision/cueing for compensatory strategies Compensations: Minimize environmental distractions;Slow rate;Small sips/bites Postural Changes and/or Swallow Maneuvers: Seated upright 90 degrees;Upright  30-60 min after meal                Oral Care Recommendations: Oral care BID Follow up Recommendations: Skilled Nursing facility SLP Visit Diagnosis: Dysphagia, oropharyngeal phase (R13.12) Plan: Continue with current plan of care       GO                Maxcine Ham 07/02/2018, 9:43 AM  Maxcine Ham, M.A. CCC-SLP Acute Herbalist [phone] Office [phone]

## 2018-07-02 NOTE — Progress Notes (Signed)
Physical Therapy Treatment Patient Details Name: [name] MRN: [id number] DOB: [birth year] Today's Date: 07/02/2018    History of Present Illness 62 y.o. female admitted on 06/17/18 for hypoxia and (+) PE.  Intubated 10/14-10/20/19.  Pt with significant PMH of R MCA stroke with residual L sided weakness, A-fib, HTN, CABG, COPD, CAD.      PT Comments    Patient tolerating sitting EOB without physical assistance. Worked on therex and PROM, discussed contracture risk reduction with Charity fundraiser. Pt limited by baseline cognitive  deficits, attempted to educate on therex will assess ability to retain info next session although suspect minimal cary over.    Follow Up Recommendations  SNF     Equipment Recommendations  Hospital bed;Wheelchair cushion (measurements PT);Wheelchair (measurements PT);Other (comment)(hoyer lift)    Recommendations for Other Services       Precautions / Restrictions Precautions Precautions: Fall Precaution Comments: left sided weakness Required Braces or Orthoses: Other Brace/Splint Other Brace/Splint: PRAFO for L foot Restrictions Weight Bearing Restrictions: No    Mobility  Bed Mobility Overal bed mobility: Needs Assistance Bed Mobility: Supine to Sit;Sit to Supine     Supine to sit: Max assist Sit to supine: Max assist   General bed mobility comments: Max assist to bring to sitting EOB, assist with L LE and L UE  Transfers                    Ambulation/Gait                 Stairs             Wheelchair Mobility    Modified Rankin (Stroke Patients Only)       Balance                                            Cognition Arousal/Alertness: Awake/alert Behavior During Therapy: WFL for tasks assessed/performed Overall Cognitive Status: History of cognitive impairments - at baseline                                 General Comments: pt near baseline with inapproprioate speech, "im  sorry your mother has to see me like this, my leg is just wanting to have a discussion" follows commands 70% of time       Exercises Other Exercises Other Exercises: PROPM of LUE, shoudler elbow and wrist hand- disucssed with RN to utilize towel in hand to reduce risk of hand contraction.      General Comments        Pertinent Vitals/Pain Pain Assessment: Faces Faces Pain Scale: No hurt    Home Living                      Prior Function            PT Goals (current goals can now be found in the care plan section) Acute Rehab PT Goals Patient Stated Goal: none stated PT Goal Formulation: With patient Time For Goal Achievement: 07/13/18 Potential to Achieve Goals: Fair Progress towards PT goals: Progressing toward goals    Frequency    Min 2X/week      PT Plan Current plan remains appropriate    Co-evaluation  AM-PAC PT "6 Clicks" Daily Activity  Outcome Measure  Difficulty turning over in bed (including adjusting bedclothes, sheets and blankets)?: Unable Difficulty moving from lying on back to sitting on the side of the bed? : Unable Difficulty sitting down on and standing up from a chair with arms (e.g., wheelchair, bedside commode, etc,.)?: Unable Help needed moving to and from a bed to chair (including a wheelchair)?: Total Help needed walking in hospital room?: Total Help needed climbing 3-5 steps with a railing? : Total 6 Click Score: 6    End of Session   Activity Tolerance: Patient tolerated treatment well Patient left: in bed;with call bell/phone within reach;with bed alarm set Nurse Communication: Mobility status;Need for lift equipment PT Visit Diagnosis: Muscle weakness (generalized) (M62.81);Difficulty in walking, not elsewhere classified (R26.2)     Time: 8527-7824 PT Time Calculation (min) (ACUTE ONLY): 22 min  Charges:  $Therapeutic Activity: 8-22 mins                     Etta Grandchild, PT, DPT Acute  Rehabilitation Services Pager: [phone] Office: [phone]     Etta Grandchild 07/02/2018, 5:27 PM

## 2018-07-02 NOTE — Progress Notes (Signed)
[place] TEAM 1 - Stepdown/ICU TEAM  [name]  IWP:[id number] DOB: 1956-07-13 DOA: 06/17/2018 PCP: Burnis Medin, MD    Brief Narrative:  62 year old female w/ a hx of tobacco abuse, dementia, sleep apnea, systolic CHF, HTN, Hypothyroidism, CVA, and COPD who resides in a SNF after a recent ICH / IVH resulting in L sided weakness who presented with hypoxia and and was found to have PE.    Significant Events: 10/9 admit 10/11 [facility] assumed care  Subjective: Pt is awake, but is confused.  Tachycardia improving  Assessment & Plan:  Acute R PE - increased RV/LV ratio - Acute Hypoxic resp failure  off anticoagulation since ICH in July - lytic tx was not indicated - presently stable on lovenox   Pseudomonas PNA Completed course of cefepime - afebrile w/ normal WBC   Obtundation / Delirium MS has waxed and wained over her prolonged hospital course.  She is slow to respond, but answers questions appropriately  Atrial fibrillation with RVR cardizem gtt stopped early this morning due to hypotension - pt now tachycardic, but BP also marginal .now on p.o. Cardizem.  Will add Toprol-XL.  Heart rate seems to be getting a little better, although still above 100  Klebsiella urinary tract infection tx completed   AKI Resolved   Persistent normocytic anemia Likely related to smoldering critical illness - w/ hx of CAD need Hgb > 8.0 - transfuse 1U PRBC today and follow   Constipation: Already on Colace.  Responded to MiraLAX  Urinary retention Foley with some leakage.  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 rate 20, height _0  (1.6 m), weight 65.1 kg, SpO2 98 %.  Intake/Output Summary (Last 24 hours) at 07/02/2018 1523 Last data filed at 07/02/2018 5053 Gross per 24 hour  Intake 180 ml  Output 550 ml  Net -370 ml   Filed Weights   06/29/18 2131 07/01/18 0600 07/02/18 0600  Weight: 67.6 kg 68.7 kg 65.1 kg    Examination: General: Responds somewhat to questions, oriented x1, no acute distress Lungs: Clear to auscultation bilaterally Cardiovascular: Regular rate and rhythm, S1-S2, occasional episodes of tachycardia Abdomen: Soft, nontender, nondistended, positive bowel sounds Extremities: No clubbing cyanosis or edema  CBC: Recent Labs  Lab 06/30/18 0349 06/30/18 1006 07/01/18 0418  WBC 4.4 3.7* 4.5  HGB 7.1* 7.6* 8.8*  HCT 23.1* 25.4* 28.7*  MCV 98.7 98.8 95.7  PLT 129* 150 976*   Basic Metabolic Panel: Recent Labs  Lab 06/26/18 0518 06/27/18 0658  06/29/18 0911 06/30/18 0349 07/01/18 0418  NA 133* 136   < > 138 137 138  K 3.5 3.3*   < > 3.6 3.2* 3.6  CL 100 102   < > 96* 98 99  CO2 27 26   < >  31 31 32  GLUCOSE 91 106*   < > 107* 97 86  BUN 13 14   < > _0 CREATININE 0.75 0.74   < > 0.76 0.90 0.96  CALCIUM 7.8* 7.8*   < > 8.9 8.1* 8.4*  MG 1.7 1.7  --   --   --  1.4*  PHOS 1.6* 2.9  --   --   --   --    < > = values in this interval not displayed.   GFR: Estimated Creatinine Clearance: 56.6 mL/min (by C-G formula based on SCr of 0.96 mg/dL).  Liver Function Tests: Recent Labs  Lab 07/01/18 0418  AST 19  ALT 10  ALKPHOS 53  BILITOT 0.6  PROT 5.5*  ALBUMIN 2.4*    HbA1C: Hgb A1c MFr Bld  Date/Time Value Ref Range Status   03/21/2018 06:50 AM 5.1 4.8 - 5.6 % Final    Comment:    (NOTE) Pre diabetes:          5.7%-6.4% Diabetes:              >6.4% Glycemic control for   <7.0% adults with diabetes   02/11/2011 06:00 AM  <5.7 % Final   5.4 (NOTE)                                                                       According to the ADA Clinical Practice Recommendations for 2011, when HbA1c is used as a screening test:   >=6.5%   Diagnostic of Diabetes Mellitus           (if abnormal result  is confirmed)  5.7-6.4%   Increased risk of developing Diabetes Mellitus  References:Diagnosis and Classification of Diabetes Mellitus,Diabetes WLNL,8921,19(ERDEY 1):S62-S69 and Standards of Medical Care in         Diabetes - 2011,Diabetes Care,2011,34  (Suppl 1):S11-S61.    No results found for this or any previous visit (from the past 240 hour(s)).   Scheduled Meds: . budesonide (PULMICORT) nebulizer solution  0.5 mg Nebulization BID  . chlorhexidine gluconate (MEDLINE KIT)  15 mL Mouth Rinse BID  . Chlorhexidine Gluconate Cloth  6 each Topical Daily  . diltiazem  120 mg Oral Q12H  . docusate  100 mg Oral BID  . enoxaparin (LOVENOX) injection  60 mg Subcutaneous Q12H  . feeding supplement (ENSURE ENLIVE)  237 mL Oral BID BM  . mouth rinse  15 mL Mouth Rinse q12n4p  . pantoprazole sodium  40 mg Oral Q1200  . povidone-iodine   Topical BID  . QUEtiapine  25 mg Oral BID     LOS: 15 days   Annita Brod, MD [facility]  To reach me or the doctor on call, go to: [email or web address] Password [email or web address]  If 7PM-7AM, please contact night-coverage per Amion 07/02/2018, 3:23 PM

## 2018-07-02 NOTE — Care Management Important Message (Signed)
Important Message  Patient Details  Name: [name] MRN: [id number] Date of Birth: 1956-05-14   Medicare Important Message Given:  Yes    Tameca Jerez 07/02/2018, 11:52 AM

## 2018-07-02 NOTE — NC FL2 (Signed)
[place] LEVEL OF CARE SCREENING TOOL     IDENTIFICATION  Patient Name: [name] Birthdate: Oct 03, 1955 Sex: female Admission Date (Current Location): 06/17/2018  [facility] and [place] Number:  Herbalist and Address:  [facility] [place], [place] [address] [place], [place] [address]      Provider Number: [id number]  Attending Physician Name and Address:  Annita Brod, MD  Relative Name and Phone Number:       Current Level of Care: Hospital Recommended Level of Care: [place] Prior Approval Number:    Date Approved/Denied:   PASRR Number:    Discharge Plan: SNF    Current Diagnoses: Patient Active Problem List   Diagnosis Date Noted  . Acute respiratory failure with hypoxia ([place])   . Pressure injury of skin 06/18/2018  . Pulmonary embolism ([place]) 06/17/2018  . Malnutrition of moderate degree 05/07/2018  . Acute renal failure (ARF) ([place]) 05/05/2018  . Hypoglycemia without diagnosis of diabetes mellitus 05/05/2018  . Dementia ([place]) 05/05/2018  . Evaluation by psychiatric service required   . GERD (gastroesophageal reflux disease) 04/15/2018  . UTI (urinary tract infection) 04/15/2018  . Atrial fibrillation, chronic 04/15/2018  . Depression 04/15/2018  . Fall 04/15/2018  . Tobacco abuse 04/15/2018  . Sepsis ([place]) 04/15/2018  . Acute metabolic encephalopathy 55/97/4163  . Hypertensive emergency 03/25/2018  . NSTEMI (non-ST elevated myocardial infarction) ([place])   . Troponin level elevated   . IVH (intraventricular hemorrhage) ([place]) 03/19/2018  . 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 Long term current use of amiodarone 03/27/2017  . Severe recurrent major depression without psychotic features ([place]) 11/19/2016    Class: Chronic  . Pre-procedure lab exam 09/13/2016  . Generalized headaches 07/04/2016  . Protein-calorie malnutrition, severe ([place]) 04/03/2015  . Chronic diastolic CHF (congestive heart  failure), NYHA class 2 ([place]) 01/24/2015  . Demand ischemia secondary to AF with RVR 12/13/2014  . CKD (chronic kidney disease), stage III ([place]) 11/11/2014  . Hypokalemia 05/22/2014  . Mood disorder ([place]) 03/20/2014  . HTN (hypertension) 03/20/2014  . Anemia, unspecified 03/20/2014  . Smoker 03/20/2014  . Atrial fibrillation with RVR ([place]) 03/03/2014  . AKI (acute kidney injury) ([place]) 03/03/2014  . Hypotension 03/03/2014  . Pulmonary hypertension ([place]) 03/03/2014  . COPD (chronic obstructive pulmonary disease) ([place]) 03/03/2014  . H/O: CVA (cerebrovascular accident)   . Long-term (current) use of anticoagulants   . Cardiomyopathy-h/o tachycardia mediated-EF 65% per echo March 2016 02/18/2014  . Hyperlipidemia 02/18/2014  . CAD '07, LAD PCI 2012, SVG-PDA PTCA 11/10/14 02/16/2014  . Atrial fibrillation with rapid ventricular response s/p TEE-DCCV (02/17/14) 02/16/2014  . Chronic diastolic heart failure ([place])   . Pulmonary nodules   . Hypothyroidism   . OSA- C-pap intol     Orientation RESPIRATION BLADDER Height & Weight     Self  Normal Incontinent, Indwelling catheter([place] placed 10/11) Weight: 143 lb 8 oz (65.1 kg) Height:  '5\' 3"'  (160 cm)  BEHAVIORAL SYMPTOMS/MOOD NEUROLOGICAL BOWEL NUTRITION STATUS      Continent Diet(DYS 1 diet, thin liquids)  AMBULATORY STATUS COMMUNICATION OF NEEDS Skin   Extensive Assist Verbally PU Stage and Appropriate Care(Unstageable: left ankle, foam dressing, change PRN. Left elbow, foam dressing,  change every 5 days.)                       Personal Care Assistance Level of Assistance  Dressing, Feeding, Bathing Bathing Assistance: Maximum assistance Feeding assistance:  Limited assistance Dressing Assistance: Maximum assistance     Functional Limitations Info  Sight, Hearing, Speech Sight Info: Adequate Hearing Info: Adequate Speech Info: Adequate    SPECIAL CARE FACTORS FREQUENCY  PT (By licensed PT), OT (By licensed OT)     PT  Frequency: 2x OT Frequency: 2x            Contractures Contractures Info: Not present    Additional Factors Info  Code Status, Allergies Code Status Info: DNR Allergies Info: Avelox Moxifloxacin Hcl In Nacl, Amoxicillin, Pamelor Nortriptyline Hcl, Penicillins, Other, Hydrocodone, Oxycodone, Sulfa Antibiotics           Current Medications (07/02/2018):  This is the current hospital active medication list Current Facility-Administered Medications  Medication Dose Route Frequency Provider Last Rate Last Dose  . 0.9 %  sodium chloride infusion   Intravenous Continuous Rigoberto Noel, MD 10 mL/hr at 06/30/18 1618    . budesonide (PULMICORT) nebulizer solution 0.5 mg  0.5 mg Nebulization BID Collene Gobble, MD   0.5 mg at 07/02/18 0944  . chlorhexidine gluconate (MEDLINE KIT) (PERIDEX) 0.12 % solution 15 mL  15 mL Mouth Rinse BID Rigoberto Noel, MD   15 mL at 07/02/18 0942  . Chlorhexidine Gluconate Cloth 2 % PADS 6 each  6 each Topical Daily Rigoberto Noel, MD   6 each at 07/02/18 [phone]  . diltiazem (CARDIZEM SR) 12 hr capsule 120 mg  120 mg Oral Q12H Annita Brod, MD   120 mg at 07/02/18 0935  . docusate (COLACE) 50 MG/5ML liquid 100 mg  100 mg Oral BID Simonne Maffucci B, MD   100 mg at 07/02/18 0935  . enoxaparin (LOVENOX) injection 60 mg  60 mg Subcutaneous Q12H Dang, Thuy D, RPH   60 mg at 07/01/18 2238  . feeding supplement (ENSURE ENLIVE) (ENSURE ENLIVE) liquid 237 mL  237 mL Oral BID BM Cherene Altes, MD   237 mL at 07/02/18 0936  . levalbuterol (XOPENEX) nebulizer solution 0.63 mg  0.63 mg Nebulization Q6H PRN Cherene Altes, MD   0.63 mg at 06/22/18 0253  . MEDLINE mouth rinse  15 mL Mouth Rinse q12n4p Rigoberto Noel, MD   15 mL at 07/01/18 1147  . metoprolol tartrate (LOPRESSOR) injection 2.5-5 mg  2.5-5 mg Intravenous Q3H PRN Annita Brod, MD   5 mg at 07/01/18 2128  . pantoprazole sodium (PROTONIX) 40 mg/20 mL oral suspension 40 mg  40 mg Oral Q1200  Simonne Maffucci B, MD   40 mg at 07/01/18 1141  . povidone-iodine (BETADINE) 10 % external solution   Topical BID Collene Gobble, MD      . QUEtiapine (SEROQUEL) tablet 25 mg  25 mg Oral BID Cherene Altes, MD   25 mg at 07/02/18 0935  . sodium chloride flush (NS) 0.9 % injection 10-40 mL  10-40 mL Intracatheter PRN Rigoberto Noel, MD   10 mL at 06/30/18 9532     Discharge Medications: Please see discharge summary for a list of discharge medications.  Relevant Imaging Results:  Relevant Lab Results:   Additional Information SSN: [id number]  Evart Mcdonnell A Mikeal Winstanley, LCSW

## 2018-07-03 ENCOUNTER — Encounter (HOSPITAL_COMMUNITY): Payer: Self-pay | Admitting: Urology

## 2018-07-03 ENCOUNTER — Inpatient Hospital Stay (HOSPITAL_COMMUNITY): Payer: PPO

## 2018-07-03 LAB — CBC
HEMATOCRIT: 22.3 % — AB (ref 36.0–46.0)
Hemoglobin: 7.1 g/dL — ABNORMAL LOW (ref 12.0–15.0)
MCH: 30.6 pg (ref 26.0–34.0)
MCHC: 31.8 g/dL (ref 30.0–36.0)
MCV: 96.1 fL (ref 80.0–100.0)
NRBC: 0 % (ref 0.0–0.2)
PLATELETS: 192 10*3/uL (ref 150–400)
RBC: 2.32 MIL/uL — ABNORMAL LOW (ref 3.87–5.11)
RDW: 18.3 % — AB (ref 11.5–15.5)
WBC: 9.5 10*3/uL (ref 4.0–10.5)

## 2018-07-03 LAB — BASIC METABOLIC PANEL
ANION GAP: 8 (ref 5–15)
BUN: 18 mg/dL (ref 6–20)
CALCIUM: 8.7 mg/dL — AB (ref 8.9–10.3)
CO2: 26 mmol/L (ref 22–32)
CREATININE: 1.33 mg/dL — AB (ref 0.44–1.00)
Chloride: 105 mmol/L (ref 98–111)
GFR calc Af Amer: 49 mL/min — ABNORMAL LOW (ref >60)
GFR, EST NON AFRICAN AMERICAN: 42 mL/min — AB (ref >60)
GLUCOSE: 164 mg/dL — AB (ref 70–99)
Potassium: 3.9 mmol/L (ref 3.5–5.1)
Sodium: 139 mmol/L (ref 135–145)

## 2018-07-03 LAB — PREPARE RBC (CROSSMATCH)

## 2018-07-03 MED ORDER — FUROSEMIDE 10 MG/ML IJ SOLN
20.0000 mg | Freq: Once | INTRAMUSCULAR | Status: DC
  Filled 2018-07-03: qty 2

## 2018-07-03 MED ORDER — MORPHINE SULFATE (PF) 2 MG/ML IV SOLN: 2.0000 mg | Freq: Once | INTRAVENOUS | Status: DC

## 2018-07-03 MED ORDER — OXYBUTYNIN CHLORIDE 5 MG PO TABS
5.0000 mg | ORAL_TABLET | Freq: Three times a day (TID) | ORAL | Status: DC
  Administered 2018-07-03 – 2018-07-04 (×5): 5 mg via ORAL
  Filled 2018-07-03 (×5): qty 1

## 2018-07-03 MED ORDER — SODIUM CHLORIDE 0.9% IV SOLUTION
Freq: Once | INTRAVENOUS | Status: AC
  Administered 2018-07-03: 18:00:00 via INTRAVENOUS

## 2018-07-03 MED ORDER — FENTANYL CITRATE (PF) 100 MCG/2ML IJ SOLN
12.5000 ug | INTRAMUSCULAR | Status: DC | PRN
  Administered 2018-07-03 – 2018-07-05 (×5): 12.5 ug via INTRAVENOUS
  Filled 2018-07-03 (×5): qty 2

## 2018-07-03 MED ORDER — APIXABAN 5 MG PO TABS
10.0000 mg | ORAL_TABLET | Freq: Two times a day (BID) | ORAL | Status: DC
  Administered 2018-07-03: 10 mg via ORAL
  Filled 2018-07-03: qty 2

## 2018-07-03 MED ORDER — FENTANYL CITRATE (PF) 100 MCG/2ML IJ SOLN
INTRAMUSCULAR | Status: AC
  Administered 2018-07-03: 12.5 ug via INTRAVENOUS
  Filled 2018-07-03: qty 2

## 2018-07-03 MED ORDER — FENTANYL CITRATE (PF) 100 MCG/2ML IJ SOLN
12.5000 ug | Freq: Once | INTRAMUSCULAR | Status: AC
  Administered 2018-07-03: 12.5 ug via INTRAVENOUS

## 2018-07-03 MED ORDER — APIXABAN 5 MG PO TABS: 5.0000 mg | ORAL_TABLET | Freq: Two times a day (BID) | ORAL | Status: DC

## 2018-07-03 NOTE — Progress Notes (Signed)
ANTICOAGULATION CONSULT NOTE - Initial Consult  Pharmacy Consult for Eliquis Indication: pulmonary embolus  Allergies  Allergen Reactions  . Avelox [Moxifloxacin Hcl In Nacl] Other (See Comments)    Mental breakdown  . Amoxicillin Hives  . Pamelor [Nortriptyline Hcl] Other (See Comments)    Made her want to hurt herself  . Penicillins Hives     Has patient had a PCN reaction causing immediate rash, facial/tongue/throat swelling, SOB or lightheadedness with hypotension: Yes Has patient had a PCN reaction causing severe rash involving mucus membranes or skin necrosis: Unk Has patient had a PCN reaction that required hospitalization: Unk Has patient had a PCN reaction occurring within the last 10 years: Unk If all of the above answers are "NO", then may proceed with Cephalosporin use.   . Other Itching    Any med that ends in -caine  . Hydrocodone Itching  . Oxycodone Itching  . Sulfa Antibiotics Other (See Comments)    "Allergic," per [facility]    Patient Measurements: Height: 5\' 3"  (160 cm) Weight: 143 lb 8 oz (65.1 kg) IBW/kg (Calculated) : 52.4  Vital Signs: Temp: 98.2 F (36.8 C) (10/25 0758) Temp Source: Oral (10/25 0758) BP: 113/75 (10/25 0758) Pulse Rate: 90 (10/25 0802)  Labs: Recent Labs    07/01/18 0418  HGB 8.8*  HCT 28.7*  PLT 127*  CREATININE 0.96    Estimated Creatinine Clearance: 56.6 mL/min (by C-G formula based on SCr of 0.96 mg/dL).   Medical History: Past Medical History:  Diagnosis Date  . Acute right MCA stroke (HCC) 11/07/10  . Atrial fibrillation (HCC)    a. s/p TEE-DCCV 02/2104; b. Xarelto started  . CAD (coronary artery disease)    a.  cath 09/2010: LAD stent patent, S-Int/dCFX ok, S-PDA ok, L-LAD atretic;  b. Lexiscan Myoview (02/2014):  no ischemia, EF 55%; c. 11/2014 Cath/PCI: LM nl, LAD 20pISR, LCX 80-54m, OM1 nl, RI 70p, RCA 40-38m, RPDA 95ost/95-41m (PTCA only w/ reduction to 50p/49m), 60d, L->LAD atretic, VG->RI->OM nl, VG->PDA 100p.  .  Cardiomyopathy with EF 40% at TEE 02/17/14 (likely tachycardia mediated - Myoview 02/19/14 neg for ischemia with normal EF) 02/18/2014  . COPD (chronic obstructive pulmonary disease) (HCC)   . Depression   . Eczema   . GERD (gastroesophageal reflux disease)   . Headache   . HLD (hyperlipidemia)   . Homelessness 11/12/2011  . 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 Hx of CABG   . Hx of transesophageal echocardiography (TEE) for monitoring 11/2010   TEE 11/2010: EF 60-65%, BAE, trivial atrial septal shunt;  right heart cath in 10/2010 with elevated R and L heart pressures and diuretic started  . Hypertension   . Hypothyroidism   . Persistent atrial fibrillation 10/2014  . Pulmonary nodules    repeat CT due in 11/2011  . Sleep apnea    recent sleep study  in 04/2014 per chart review  shows no significant OSA    Medications:  Medications Prior to Admission  Medication Sig Dispense Refill Last Dose  . atorvastatin (LIPITOR) 40 MG tablet Take 1 tablet (40 mg total) by mouth daily at 6 PM.   06/16/2018 at 1800  . cetirizine (ZYRTEC) 10 MG tablet Take 10 mg by mouth daily.   06/17/2018 at 0730  . dicyclomine (BENTYL) 20 MG tablet Take 20 mg by mouth 2 (two) times daily.  06/17/2018 at 0900  . diphenoxylate-atropine (LOMOTIL) 2.5-0.025 MG tablet Take 1 tablet by mouth 3 (three) times daily as needed for diarrhea or loose stools.    06/17/2018 at 1400  . ENSURE (ENSURE) Take 237 mLs by mouth 3 (three) times daily between meals.   06/17/2018 at 1200  . FLUoxetine (PROZAC) 20 MG capsule Take 20 mg by mouth daily.    06/17/2018 at 0900  . gabapentin (NEURONTIN) 300 MG capsule Take 300 mg by mouth 3 (three) times daily.    06/17/2018 at 1400  . lisinopril (PRINIVIL,ZESTRIL) 10 MG tablet Take 10 mg by mouth every evening.    06/16/2018 at 1800  . ondansetron (ZOFRAN) 4 MG tablet Take 4 mg by mouth every 6 (six) hours as needed  for nausea or vomiting.    06/17/2018 at 1200  . pantoprazole (PROTONIX) 40 MG tablet Take 1 tablet (40 mg total) by mouth daily. 30 tablet 0 06/17/2018 at 0900    Assessment: 62 yo F admitted 10/9 with acute PE.  Pt was initially started on Heparin and transitioned to Lovenox while in the ICU.  Pt clinically improving and appropriate to change to oral anticoagulation.  Of note, pt was not on anticoag PTA due to hx ICH in July but has been stable on anticoag since admission with no major bleeding noted.  Goal of Therapy:  therapeutic anticoaglation Monitor platelets by anticoagulation protocol: Yes   Plan:  Discontinue Lovenox (last dose 10/24 PM) Start Eliquis 10mg  PO BID x 7 days, then 5mg  PO BID - first dose now Continue to monitor for s/sx bleed  [employer], Pharm.D., BCPS Clinical Pharmacist Pager: [phone] Clinical phone for 07/03/2018 from 8:30-4:00 is [phone].  **Pharmacist phone directory can now be found on [email or web address] ([email or web address]).  Listed under [facility].  07/03/2018 12:37 PM

## 2018-07-03 NOTE — Progress Notes (Signed)
Patient ID: [name], female   DOB: Jun 23, 1956, 62 y.o.   MRN: [id number]   Full note to follow.   85fr foley replaced and irrigates quantitatively.   I don't think she is in retention but has another cause for the mass, pain and oliguria.     I have ordered a stat CT AP.

## 2018-07-03 NOTE — Consult Note (Signed)
Subjective: CC: Suprapubic pain.  Hx: [name] is a 62 yo WF who I was asked to see in consultation by Dr. Waldron Labs for foley placement.  She had reduced catheter output this morning with leakage around the catheter and several attempts didn't return urine despite irrigation and a PVR of 400+ ml on bladder scanning.  She is current agitated and in marked pain pointing to the lower abdomen.   She has had prior bladder surgery on review of her records but is unable to provide additional GU history.   ROS:  Review of Systems  Unable to perform ROS: Dementia    Allergies  Allergen Reactions  . Avelox [Moxifloxacin Hcl In Nacl] Other (See Comments)    Mental breakdown  . Amoxicillin Hives  . Pamelor [Nortriptyline Hcl] Other (See Comments)    Made her want to hurt herself  . Penicillins Hives     Has patient had a PCN reaction causing immediate rash, facial/tongue/throat swelling, SOB or lightheadedness with hypotension: Yes Has patient had a PCN reaction causing severe rash involving mucus membranes or skin necrosis: Unk Has patient had a PCN reaction that required hospitalization: Unk Has patient had a PCN reaction occurring within the last 10 years: Unk If all of the above answers are "NO", then may proceed with Cephalosporin use.   . Other Itching    Any med that ends in -caine  . Hydrocodone Itching  . Oxycodone Itching  . Sulfa Antibiotics Other (See Comments)    "Allergic," per [facility]    Past Medical History:  Diagnosis Date  . Acute right MCA stroke ([place]) 11/07/10  . Atrial fibrillation ([place])    a. s/p TEE-DCCV 02/2104; b. Xarelto started  . CAD (coronary artery disease)    a.  cath 09/2010: LAD stent patent, S-Int/dCFX ok, S-PDA ok, L-LAD atretic;  b. Lexiscan Myoview (02/2014):  no ischemia, EF 55%; c. 11/2014 Cath/PCI: LM nl, LAD 20pISR, LCX 80-51m OM1 nl, RI 70p, RCA 40-511mRPDA 95ost/95-9572mTCA only w/ reduction to 50p/96m21m0d, L->LAD atretic, VG->RI->OM nl, VG->PDA  100p.  . Cardiomyopathy with EF 40% at TEE 02/17/14 (likely tachycardia mediated - Myoview 02/19/14 neg for ischemia with normal EF) 02/18/2014  . COPD (chronic obstructive pulmonary disease) (HCC)[place]. Depression   . Eczema   . GERD (gastroesophageal reflux disease)   . Headache   . HLD (hyperlipidemia)   . Homelessness 11/12/2011  . HPV test positive    with Ascus on pap 2015, followed by Dr HollMatthew SarasHx MRSA infection    Chest wall syndrome post CABG  . Hx of CABG   . Hx of transesophageal echocardiography (TEE) for monitoring 11/2010   TEE 11/2010: EF 60-65%, BAE, trivial atrial septal shunt;  right heart cath in 10/2010 with elevated R and L heart pressures and diuretic started  . Hypertension   . Hypothyroidism   . Persistent atrial fibrillation 10/2014  . Pulmonary nodules    repeat CT due in 11/2011  . Sleep apnea    recent sleep study  in 04/2014 per chart review  shows no significant OSA    Past Surgical History:  Procedure Laterality Date  . BLADDER SURGERY    . CARDIAC CATHETERIZATION  2012  . CARDIOVERSION N/A 02/17/2014   Procedure: CARDIOVERSION;  Surgeon: PaulFay Records;  [facility];  Service: Cardiovascular;  Laterality: N/A;  . CARDIOVERSION N/A 09/20/2016   Procedure: CARDIOVERSION;  Surgeon: KataDorothy Spark;  [facility];  Service: Cardiovascular;  Laterality: N/A;  . CHEST WALL RECONSTRUCTION    . CORONARY ARTERY BYPASS GRAFT    . debriment for infection in chest    . HERNIA REPAIR    . KNEE ARTHROSCOPY Bilateral   . LEFT AND RIGHT HEART CATHETERIZATION WITH CORONARY ANGIOGRAM N/A 11/10/2014   Procedure: LEFT AND RIGHT HEART CATHETERIZATION WITH CORONARY ANGIOGRAM;  Surgeon: Leonie Man, MD;  Location: [facility] CATH LAB;  Service: Cardiovascular;  Laterality: N/A;  . Left mastoidectomy    . TEE WITHOUT CARDIOVERSION N/A 02/17/2014   Procedure: TRANSESOPHAGEAL ECHOCARDIOGRAM (TEE);  Surgeon: Fay Records, MD;  Location: [facility] ENDOSCOPY;   Service: Cardiovascular;  Laterality: N/A;    Social History   Socioeconomic History  . Marital status: Divorced    Spouse name: Not on file  . Number of children: 2  . Years of education: Not on file  . Highest education level: Not on file  Occupational History  . Occupation: DISABLE   Social Needs  . Financial resource strain: Not on file  . Food insecurity:    Worry: Not on file    Inability: Not on file  . Transportation needs:    Medical: Not on file    Non-medical: Not on file  Tobacco Use  . Smoking status: Current Every Day Smoker    Packs/day: 0.50    Years: 46.00    Pack years: 23.00    Types: Cigarettes  . Smokeless tobacco: Never Used  . Tobacco comment: 05/05/2018 "quit a couple weeks ago"  Substance and Sexual Activity  . Alcohol use: No    Alcohol/week: 0.0 standard drinks  . Drug use: No  . Sexual activity: Not Currently  Lifestyle  . Physical activity:    Days per week: Not on file    Minutes per session: Not on file  . Stress: Not on file  Relationships  . Social connections:    Talks on phone: Not on file    Gets together: Not on file    Attends religious service: Not on file    Active member of club or organization: Not on file    Attends meetings of clubs or organizations: Not on file    Relationship status: Not on file  . Intimate partner violence:    Fear of current or ex partner: Not on file    Emotionally abused: Not on file    Physically abused: Not on file    Forced sexual activity: Not on file  Other Topics Concern  . Not on file  Social History Narrative   8/11   Divorced   After stroke was living with sister and mother after sister asked her to leave. Mom has since deceased    Difficulties over control.    Then moved back in for about 6 weeks.   . tranportaion difficult son  helping   Ex-smoker   Was living at friends  Sister and her had argument and she was told to leave .    She was living in a homeless shelter for the last  3 weeks [facility]    son had help with transportation and medication       Work status regular  before got sick on short-term disability now lost t insurance after the stroke and couldn't work was  denied ssi    2 times.  She is not eligible for Medicaid because she doesn't have dependent children.  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   Lives in  rented house 2 house mates males keep tp self has her own b room  Doing well     Family History  Problem Relation Age of Onset  . COPD Mother   . Heart disease Mother   . Arthritis Mother        Rheumatoid and PMR  . Osteoporosis Mother        Mom fractured hip  . Diabetes type II Mother   . Depression Mother   . Heart attack Father   . Depression Father   . Hypertension Father   . Alcohol abuse Father   . Depression Sister   . Anxiety disorder Sister   . Drug abuse Sister   . Stroke Neg Hx     Anti-infectives: Anti-infectives (From admission, onward)   Start     Dose/Rate Route Frequency Ordered Stop   06/25/18 2200  ceFEPIme (MAXIPIME) 2 g in sodium chloride 0.9 % 100 mL IVPB     2 g 200 mL/hr over 30 Minutes Intravenous Every 12 hours 06/25/18 1012 06/28/18 2258   06/25/18 0000  vancomycin (VANCOCIN) IVPB 750 mg/150 ml premix  Status:  Discontinued     750 mg 150 mL/hr over 60 Minutes Intravenous Every 24 hours 06/24/18 1309 06/25/18 0926   06/24/18 2100  ceFEPIme (MAXIPIME) 1 g in sodium chloride 0.9 % 100 mL IVPB  Status:  Discontinued     1 g 200 mL/hr over 30 Minutes Intravenous Every 12 hours 06/24/18 1313 06/25/18 1012   06/24/18 1400  ceFEPIme (MAXIPIME) 2 g in sodium chloride 0.9 % 100 mL IVPB  Status:  Discontinued     2 g 200 mL/hr over 30 Minutes Intravenous Every 8 hours 06/24/18 1122  06/24/18 1313   06/22/18 2300  vancomycin (VANCOCIN) IVPB 750 mg/150 ml premix  Status:  Discontinued     750 mg 150 mL/hr over 60 Minutes Intravenous Every 12 hours 06/22/18 1049 06/24/18 1309   06/22/18 1200  metroNIDAZOLE (FLAGYL) IVPB 500 mg  Status:  Discontinued     500 mg 100 mL/hr over 60 Minutes Intravenous Every 8 hours 06/22/18 0815 06/24/18 1122   06/22/18 1100  vancomycin (VANCOCIN) 1,250 mg in sodium chloride 0.9 % 250 mL IVPB     1,250 mg 166.7 mL/hr over 90 Minutes Intravenous  Once 06/22/18 1030 06/22/18 1929   06/22/18 0600  metroNIDAZOLE (FLAGYL) IVPB 500 mg  Status:  Discontinued     500 mg 100 mL/hr over 60 Minutes Intravenous Every 8 hours 06/22/18 0543 06/22/18 0815   06/22/18 0545  ceFEPIme (MAXIPIME) 1 g in sodium chloride 0.9 % 100 mL IVPB  Status:  Discontinued     1 g 200 mL/hr over 30 Minutes Intravenous Every 12 hours 06/22/18 0543 06/24/18 1122   06/19/18 0100  cefTRIAXone (ROCEPHIN) 1 g in sodium chloride 0.9 % 100 mL IVPB     1 g 200 mL/hr over 30 Minutes Intravenous Every 24 hours 06/18/18 1004 06/22/18 1928   06/17/18 2130  cefTRIAXone (ROCEPHIN) 2 g in sodium chloride 0.9 % 100 mL IVPB  Status:  Discontinued     2 g 200 mL/hr over 30 Minutes Intravenous Every 24 hours 06/17/18 2002  06/18/18 1004      Current Facility-Administered Medications  Medication Dose Route Frequency Provider Last Rate Last Dose  . 0.9 %  sodium chloride infusion   Intravenous Continuous Annita Brod, MD 10 mL/hr at 06/30/18 1618    . budesonide (PULMICORT) nebulizer solution 0.5 mg  0.5 mg Nebulization BID Annita Brod, MD   0.5 mg at 07/03/18 0801  . chlorhexidine gluconate (MEDLINE KIT) (PERIDEX) 0.12 % solution 15 mL  15 mL Mouth Rinse BID Annita Brod, MD   15 mL at 07/03/18 0909  . Chlorhexidine Gluconate Cloth 2 % PADS 6 each  6 each Topical Daily Annita Brod, MD   6 each at 07/03/18 [phone]  . diltiazem (CARDIZEM SR) 12 hr capsule 120 mg  120 mg  Oral Q12H Annita Brod, MD   120 mg at 07/03/18 0910  . docusate (COLACE) 50 MG/5ML liquid 100 mg  100 mg Oral BID Annita Brod, MD   100 mg at 07/03/18 0905  . feeding supplement (ENSURE ENLIVE) (ENSURE ENLIVE) liquid 237 mL  237 mL Oral BID BM Annita Brod, MD   237 mL at 07/03/18 1507  . levalbuterol (XOPENEX) nebulizer solution 0.63 mg  0.63 mg Nebulization Q6H PRN Annita Brod, MD   0.63 mg at 06/22/18 0253  . MEDLINE mouth rinse  15 mL Mouth Rinse q12n4p Annita Brod, MD   15 mL at 07/03/18 1507  . metoprolol succinate (TOPROL-XL) 24 hr tablet 12.5 mg  12.5 mg Oral Daily Annita Brod, MD   12.5 mg at 07/03/18 0907  . metoprolol tartrate (LOPRESSOR) injection 2.5-5 mg  2.5-5 mg Intravenous Q3H PRN Annita Brod, MD   5 mg at 07/01/18 2128  . oxybutynin (DITROPAN) tablet 5 mg  5 mg Oral TID [name], Tina Huguenin, MD   5 mg at 07/03/18 1306  . pantoprazole sodium (PROTONIX) 40 mg/20 mL oral suspension 40 mg  40 mg Oral Q1200 Annita Brod, MD   40 mg at 07/03/18 1302  . povidone-iodine (BETADINE) 10 % external solution   Topical BID Annita Brod, MD      . QUEtiapine (SEROQUEL) tablet 25 mg  25 mg Oral BID Annita Brod, MD   25 mg at 07/03/18 0911  . sodium chloride flush (NS) 0.9 % injection 10-40 mL  10-40 mL Intracatheter PRN Annita Brod, MD   10 mL at 06/30/18 0855     Objective: Vital signs in last 24 hours: Temp:  [98.1 F (36.7 C)-98.6 F (37 C)] 98.2 F (36.8 C) (10/25 0758) Pulse Rate:  [82-102] 90 (10/25 0802) Resp:  [10-24] 13 (10/25 0802) BP: (113-146)/(75-89) 113/75 (10/25 0758) SpO2:  [96 %-98 %] 97 % (10/25 0802) Weight:  [64.2 kg] 64.2 kg (10/25 1300)  Intake/Output from previous day: 10/24 0701 - 10/25 0700 In: 100 [P.O.:100] Out: 400 [Urine:400] Intake/Output this shift: Total I/O In: 120 [P.O.:120] Out: -    Physical Exam  Constitutional: She appears well-developed and well-nourished.  HENT:   Head: Normocephalic and atraumatic.  Cardiovascular: Normal rate and regular rhythm.  Pulmonary/Chest: Effort normal. No respiratory distress.  Abdominal: Soft. She exhibits mass (in suprapubic area to umbilicus.). There is tenderness.  Genitourinary:  Genitourinary Comments: The introital anatomy is normal. Her urethra is in the normal position.  A foley was easily placed but there was no return but the catheter irrigated quantitatively.  There is a mass effect in the area of the  bladder beyond the anterior vaginal wall.   Musculoskeletal:       Right lower leg: She exhibits no tenderness and no edema.       Left lower leg: She exhibits no tenderness and no edema.  Skin: Skin is warm and dry. There is pallor.  Psychiatric: She is agitated.  Vitals reviewed.   Lab Results:  Recent Labs    07/01/18 0418  WBC 4.5  HGB 8.8*  HCT 28.7*  PLT 127*   BMET Recent Labs    07/01/18 0418  NA 138  K 3.6  CL 99  CO2 32  GLUCOSE 86  BUN 17  CREATININE 0.96  CALCIUM 8.4*   PT/INR No results for input(s): LABPROT, INR in the last 72 hours. ABG No results for input(s): PHART, HCO3 in the last 72 hours.  Invalid input(s): PCO2, PO2  Studies/Results: Ct Abdomen Pelvis Wo Contrast  Result Date: 07/03/2018 CLINICAL DATA:  Suprapubic mass and pain. EXAM: CT ABDOMEN AND PELVIS WITHOUT CONTRAST TECHNIQUE: Multidetector CT imaging of the abdomen and pelvis was performed following the standard protocol without IV contrast. COMPARISON:  CT scan from 2012. FINDINGS: Lower chest: The lung bases are grossly clear. Moderate breathing motion artifact but no definite infiltrates or effusions. Streaky bibasilar atelectasis. Diastasis of the chest wall possibly related to prior trauma or median sternotomy. Hepatobiliary: No focal hepatic lesions or intrahepatic biliary dilatation. Layering gallstones noted the gallbladder. No common bile duct dilatation. Pancreas: No mass, inflammation or ductal  dilatation. Moderate pancreatic atrophy. Spleen: Normal size.  No focal lesions. Adrenals/Urinary Tract: Mildly thickened adrenal glands bilaterally without discrete lesions. No renal calculi or mass. No ureteral calculi. The bladder is being compressed by a large left-sided extra peritoneal pelvic hematoma also involving the abdominal retroperitoneum. This measures approximately 11.3 x 11.7 x 8.5 cm. There is a Foley catheter in the bladder and small amount of air. Stomach/Bowel: Grossly normal. No inflammatory changes, mass lesions or obstructive findings. Small duodenal lipoma noted incidentally. Vascular/Lymphatic: Advanced atherosclerotic calcifications involving the aorta and iliac arteries and branch vessels. No mesenteric or retroperitoneal mass or adenopathy. Reproductive: Grossly normal. Other: Large left-sided retroperitoneal and extraperitoneal pelvic hematoma as detailed above. There is also a small hematoma in the anterior abdominal wall on the right side. There is also a small left rectus muscle hematoma. Musculoskeletal: No significant bony findings. IMPRESSION: 1. Large left-sided extraperitoneal pelvic hematoma also in the lower abdominal retroperitoneum. Moderate mass effect on the bladder. 2. Small hematoma in the left rectus muscle and also in the right lower subcutaneous abdominal wall. 3. No intra-abdominal/intrapelvic mass or adenopathy. 4. Foley catheter in good position without complicating features. These results will be called to the ordering clinician or representative by the Radiologist Assistant, and communication documented in the PACS or zVision Dashboard. Electronically Signed   By: Marijo Sanes M.D.   On: 07/03/2018 15:38   A 41f foley was placed with sterile technique after a betadine prep.  The catheter was irrigated with quantitative clear return and placed to straight drainage.    I ordered a stat CT AP which shows a large left retroperitonal, rectus sheath and pelvic  hematoma with extrinsic bladder compression.    Assessment: Severe oliguria with a large left pelvic and retroperitoneal hematoma on recent Lovenox.    Leave foley for fluid management.   No other GU recommendations at this time.     CC: Dr. DPhillips Climes      JIrine Seal10/25/2019 [phone]

## 2018-07-03 NOTE — Progress Notes (Addendum)
[name]  KGU:[id number] DOB: Nov 16, 1955 DOA: 06/17/2018 PCP: Burnis Medin, MD    Brief Narrative:  62 year old female w/ a hx of tobacco abuse, dementia, sleep apnea, systolic CHF, HTN, Hypothyroidism, CVA, and COPD who resides in a SNF after a recent ICH / IVH  while on Xarelto for A. fib resulting in L sided weakness who presented with hypoxia and and was found to have PE.    Significant Events: 10/9 > Admit Acute PE 10/10 > Speech Evaluation > Mild aspiration Risk >  No aspiration Noted  10/11 > Transfer to [facility]  10/13: Increased tachycardia, tachypnea and respiratory distress rapid response called, more agitated, recurrent atrial fibrillation with rapid ventricular response. 10/14: Critical care reconsulted for respiratory distress, intubated 10/20: one way extubation  Subjective: Pt is awake, but is confused.  Has some urinary retention, and leaking around Foley site overnight  Assessment & Plan:  Acute R PE  - increased RV/LV ratio - Acute Hypoxic resp failure  -off anticoagulation since ICH in July - lytic tx was not indicated -She is currently on Lovenox, I have discussed with Dr Erlinda Hong from neurology, initial plan was to repeat CT head in 3 to 4 weeks from initial IVH event, of no recurrence of bleed she was supposed to be started on Eliquis, I have discussed with him, she had a repeat CTA head x2 with no evidence of IVH hemorrhage, so recommendation is to resume on Eliquis, given her submassive PE on admission will start Eliquis with a loading dose.  Pseudomonas PNA Completed course of cefepime - afebrile w/ normal WBC   Obtundation / Delirium MS has waxed and wained over her prolonged hospital course.  She is slow to respond, but answers questions appropriately  Atrial fibrillation with RVR A. fib with RVR hard to control, especially in the setting of hypotension, now blood pressure has improved, as well heart rate is improved after adding Toprol-XL, so for now  continue with Cardizem and Toprol-XL . -Please see above discussion about anticoagulation .  Urinary retention  -Patient has Foley catheter, had some leaking around, bladder scan this morning showing retention, so Foley catheter was discontinued, multiple attempts were in and out has been performed with no success, as well with Foley catheter was reinserted and flushed with no urine output, so we will proceed with coud catheter, if no urine output will consult urology .   Klebsiella urinary tract infection tx completed   AKI Resolved   Persistent normocytic anemia Likely related to smoldering critical illness - w/ hx of CAD need Hgb > 8.0 - transfuse 1U PRBC today and follow   Constipation: Already on Colace.  Responded to MiraLAX  Urinary retention Foley with some leakage.  Attempting to change to pure wick catheter  COPD without acute exacerbation Using Xopenex so as not to exacerbate RVR - no acute exacerbation   CAD with history of CABG  Hyperlipidemia  History of right MCA stroke with residual L sided weakness  IVH July 2019 resolved on CT head this admission  Pressure injuries left heel and left elbow WOC suggestions being followed   Addendum: CT abdomen pelvis significant for large x-ray peritoneal/retroperitoneal/left abdominal wall hematoma most likely contributing to her significant pain, as well causing extrinsic bladder compression, this point I have stopped her anticoagulation, will obtain stat CBC to monitor her hemoglobin, and transfuse if needed, will keep active type and screen, have discussed with IR, will place IVC filter, and her  current PE, in the setting of right lower extremity acute DVT. -I have discussed with the son and updated him, and have informed him the significant complication, and her prognosis is quite poor, and palliative medicine will be consulted, and he is agreeable to this plan.  DVT prophylaxis: lovenox   Code Status: DNR - NO  CODE Family Communication: no family present at time of exam  Disposition Plan: Eventual return back to skilled nursing, once approved  Consultants:  PCCM  Antimicrobials:  None presently  Objective: Blood pressure 113/75, pulse 90, temperature 98.2 F (36.8 C), temperature source Oral, resp. rate 13, height _0  (1.6 m), weight 65.1 kg, SpO2 97 %.  Intake/Output Summary (Last 24 hours) at 07/03/2018 1147 Last data filed at 07/03/2018 0900 Gross per 24 hour  Intake 220 ml  Output 400 ml  Net -180 ml   Filed Weights   06/29/18 2131 07/01/18 0600 07/02/18 0600  Weight: 67.6 kg 68.7 kg 65.1 kg    Examination:  Awake , confused, restless  symmetrical Chest wall movement, Good air movement bilaterally, CTAB Irregular irregular,No Gallops,Rubs or new Murmurs, No Parasternal Heave +ve B.Sounds, Abd Soft, has some suprapubic tenderness, No rebound - guarding or rigidity. No Cyanosis, Clubbing or edema,    CBC: Recent Labs  Lab 06/30/18 0349 06/30/18 1006 07/01/18 0418  WBC 4.4 3.7* 4.5  HGB 7.1* 7.6* 8.8*  HCT 23.1* 25.4* 28.7*  MCV 98.7 98.8 95.7  PLT 129* 150 993*   Basic Metabolic Panel: Recent Labs  Lab 06/27/18 0658  06/29/18 0911 06/30/18 0349 07/01/18 0418  NA 136   < > 138 137 138  K 3.3*   < > 3.6 3.2* 3.6  CL 102   < > 96* 98 99  CO2 26   < > 31 31 32  GLUCOSE 106*   < > 107* 97 86  BUN 14   < > _1 CREATININE 0.74   < > 0.76 0.90 0.96  CALCIUM 7.8*   < > 8.9 8.1* 8.4*  MG 1.7  --   --   --  1.4*  PHOS 2.9  --   --   --   --    < > = values in this interval not displayed.   GFR: Estimated Creatinine Clearance: 56.6 mL/min (by C-G formula based on SCr of 0.96 mg/dL).  Liver Function Tests: Recent Labs  Lab 07/01/18 0418  AST 19  ALT 10  ALKPHOS 53  BILITOT 0.6  PROT 5.5*  ALBUMIN 2.4*    HbA1C: Hgb A1c MFr Bld  Date/Time Value Ref Range Status  03/21/2018 06:50 AM 5.1 4.8 - 5.6 % Final    Comment:    (NOTE) Pre diabetes:           5.7%-6.4% Diabetes:              >6.4% Glycemic control for   <7.0% adults with diabetes   02/11/2011 06:00 AM  <5.7 % Final   5.4 (NOTE)                                                                       According to the ADA Clinical Practice Recommendations for 2011, when HbA1c is  used as a screening test:   >=6.5%   Diagnostic of Diabetes Mellitus           (if abnormal result  is confirmed)  5.7-6.4%   Increased risk of developing Diabetes Mellitus  References:Diagnosis and Classification of Diabetes Mellitus,Diabetes NZDK,2099,06(UJNWM 1):S62-S69 and Standards of Medical Care in         Diabetes - 2011,Diabetes GEEA,3353,31  (Suppl 1):S11-S61.    No results found for this or any previous visit (from the past 240 hour(s)).   Scheduled Meds: . budesonide (PULMICORT) nebulizer solution  0.5 mg Nebulization BID  . chlorhexidine gluconate (MEDLINE KIT)  15 mL Mouth Rinse BID  . Chlorhexidine Gluconate Cloth  6 each Topical Daily  . diltiazem  120 mg Oral Q12H  . docusate  100 mg Oral BID  . enoxaparin (LOVENOX) injection  60 mg Subcutaneous Q12H  . feeding supplement (ENSURE ENLIVE)  237 mL Oral BID BM  . mouth rinse  15 mL Mouth Rinse q12n4p  . metoprolol succinate  12.5 mg Oral Daily  . oxybutynin  5 mg Oral TID  . pantoprazole sodium  40 mg Oral Q1200  . povidone-iodine   Topical BID  . QUEtiapine  25 mg Oral BID     LOS: 16 days   Phillips Climes, MD [facility]  To reach me or the doctor on call, go to: [email or web address] Password [email or web address]  If 7PM-7AM, please contact night-coverage per Amion 07/03/2018, 11:47 AM

## 2018-07-03 NOTE — Consult Note (Signed)
Chief Complaint: Patient was seen in consultation today for IVC filter placement.  Referring Physician(s): Dr. Randol Kern  Supervising Physician: Simonne Come  Patient Status: [facility] - In-pt  History of Present Illness: [name] is a 62 y.o. 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 She presented from SNF with cough and hypoxia, CTA showed acute PE in right pulmonary arterial system with evidence of right heart strain - she was started on heparin drip and then changed to Lovenox. Lower extremity venous study on 10/14 showed acute, highly mobile DVT in the proximal left femoral vein as well as cute occlusive DVT in the mid to distal femoral vein, popliteal vein, posterior tibial vein and peroneal veins. Request made to IR for IVC filter placement.  She also recently had decreased output from her foley today with leakage around the catheter - foley was removed and several attempts were made to in and out cath which were unsuccessful. Dr. Annabell Howells was consulted who ordered CT abdomen/pelvis w/o contrast which showed a large left-sided extraperitoneal pelvic hematoma also in the lower abdominal retroperitoneum, small hematoma in the the left rectus muscle and right lower subcutaneous abdominal wall.   Patient with waxing and waning mental status - poor historian on exam today. Very pleasant and responds to some questions appropriately, talking to people in the room who are not there. Complains of right leg pain, abdominal pain and cough today.   Past Medical History:  Diagnosis Date  . Acute right MCA stroke (HCC) 11/07/10  . Atrial fibrillation (HCC)    a. s/p TEE-DCCV 02/2104; b. Xarelto started  . CAD (coronary artery disease)    a.  cath 09/2010: LAD stent patent, S-Int/dCFX ok,  S-PDA ok, L-LAD atretic;  b. Lexiscan Myoview (02/2014):  no ischemia, EF 55%; c. 11/2014 Cath/PCI: LM nl, LAD 20pISR, LCX 80-35m, OM1 nl, RI 70p, RCA 40-29m, RPDA 95ost/95-77m (PTCA only w/ reduction to 50p/68m), 60d, L->LAD atretic, VG->RI->OM nl, VG->PDA 100p.  . Cardiomyopathy with EF 40% at TEE 02/17/14 (likely tachycardia mediated - Myoview 02/19/14 neg for ischemia with normal EF) 02/18/2014  . COPD (chronic obstructive pulmonary disease) (HCC)   . Depression   . Eczema   . GERD (gastroesophageal reflux disease)   . Headache   . HLD (hyperlipidemia)   . Homelessness 11/12/2011  . 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 Hx of CABG   . Hx of transesophageal echocardiography (TEE) for monitoring 11/2010   TEE 11/2010: EF 60-65%, BAE, trivial atrial septal shunt;  right heart cath in 10/2010 with elevated R and L heart pressures and diuretic started  . Hypertension   . Hypothyroidism   . Persistent atrial fibrillation 10/2014  . Pulmonary nodules    repeat CT due in 11/2011  . Sleep apnea    recent sleep study  in 04/2014 per chart review  shows no significant OSA    Past Surgical History:  Procedure Laterality Date  . BLADDER SURGERY    . CARDIAC CATHETERIZATION  2012  . CARDIOVERSION N/A 02/17/2014   Procedure: CARDIOVERSION;  Surgeon: Pricilla Riffle, MD;  Location: [facility] ENDOSCOPY;  Service: Cardiovascular;  Laterality: N/A;  .  CARDIOVERSION N/A 09/20/2016   Procedure: CARDIOVERSION;  Surgeon: Lars Masson, MD;  Location: [facility] ENDOSCOPY;  Service: Cardiovascular;  Laterality: N/A;  . CHEST WALL RECONSTRUCTION    . CORONARY ARTERY BYPASS GRAFT    . debriment for infection in chest    . HERNIA REPAIR    . KNEE ARTHROSCOPY Bilateral   . LEFT AND RIGHT HEART CATHETERIZATION WITH CORONARY ANGIOGRAM N/A 11/10/2014   Procedure: LEFT AND RIGHT HEART CATHETERIZATION WITH CORONARY ANGIOGRAM;  Surgeon: Marykay Lex, MD;  Location: [facility] CATH LAB;  Service: Cardiovascular;  Laterality: N/A;  . Left mastoidectomy    . TEE WITHOUT CARDIOVERSION N/A 02/17/2014   Procedure: TRANSESOPHAGEAL ECHOCARDIOGRAM (TEE);  Surgeon: Pricilla Riffle, MD;  Location: [facility] ENDOSCOPY;  Service: Cardiovascular;  Laterality: N/A;    Allergies: Avelox [moxifloxacin hcl in nacl]; Amoxicillin; Pamelor [nortriptyline hcl]; Penicillins; Other; Hydrocodone; Oxycodone; and Sulfa antibiotics  Medications: Prior to Admission medications   Medication Sig Start Date End Date Taking? Authorizing Provider  atorvastatin (LIPITOR) 40 MG tablet Take 1 tablet (40 mg total) by mouth daily at 6 PM. 03/24/18  Yes Biby, Jani Files, NP  cetirizine (ZYRTEC) 10 MG tablet Take 10 mg by mouth daily.   Yes [provider]  dicyclomine (BENTYL) 20 MG tablet Take 20 mg by mouth 2 (two) times daily.   Yes [provider]  diphenoxylate-atropine (LOMOTIL) 2.5-0.025 MG tablet Take 1 tablet by mouth 3 (three) times daily as needed for diarrhea or loose stools.    Yes [provider]  ENSURE (ENSURE) Take 237 mLs by mouth 3 (three) times daily between meals.   Yes [provider]  FLUoxetine (PROZAC) 20 MG capsule Take 20 mg by mouth daily.    Yes [provider]  gabapentin (NEURONTIN) 300 MG capsule Take 300 mg by mouth 3 (three) times daily.    Yes [provider]  lisinopril (PRINIVIL,ZESTRIL) 10 MG tablet Take 10 mg by mouth every evening.    Yes [provider]  ondansetron (ZOFRAN) 4 MG tablet Take 4 mg by mouth every 6 (six) hours as needed for nausea or vomiting.    Yes [provider]  pantoprazole (PROTONIX) 40 MG tablet Take 1 tablet (40 mg total) by mouth daily. 05/09/18  Yes Burnadette Pop, MD     Family History  Problem Relation Age of Onset  . COPD Mother   . Heart disease Mother   . Arthritis Mother        Rheumatoid and PMR  . Osteoporosis Mother        Mom fractured  hip  . Diabetes type II Mother   . Depression Mother   . Heart attack Father   . Depression Father   . Hypertension Father   . Alcohol abuse Father   . Depression Sister   . Anxiety disorder Sister   . Drug abuse Sister   . Stroke Neg Hx     Social History   Socioeconomic History  . Marital status: Divorced    Spouse name: Not on file  . Number of children: 2  . Years of education: Not on file  . Highest education level: Not on file  Occupational History  . Occupation: DISABLE   Social Needs  . Financial resource strain: Not on file  . Food insecurity:    Worry: Not on file    Inability: Not on file  . Transportation needs:    Medical: Not on file    Non-medical:  Not on file  Tobacco Use  . Smoking status: Current Every Day Smoker    Packs/day: 0.50    Years: 46.00    Pack years: 23.00    Types: Cigarettes  . Smokeless tobacco: Never Used  . Tobacco comment: 05/05/2018 "quit a couple weeks ago"  Substance and Sexual Activity  . Alcohol use: No    Alcohol/week: 0.0 standard drinks  . Drug use: No  . Sexual activity: Not Currently  Lifestyle  . Physical activity:    Days per week: Not on file    Minutes per session: Not on file  . Stress: Not on file  Relationships  . Social connections:    Talks on phone: Not on file    Gets together: Not on file    Attends religious service: Not on file    Active member of club or organization: Not on file    Attends meetings of clubs or organizations: Not on file    Relationship status: Not on file  Other Topics Concern  . Not on file  Social History Narrative   8/11   Divorced   After stroke was living with sister and mother after sister asked her to leave. Mom has since deceased    Difficulties over control.    Then moved back in for about 6 weeks.   . tranportaion difficult son  helping   Ex-smoker   Was living at friends  Sister and her had argument and she was told to leave .    She was living in a homeless  shelter for the last 3 weeks [facility]    son had help with transportation and medication       Work status regular  before got sick on short-term disability now lost t insurance after the stroke and couldn't work was  denied ssi    2 times.  She is not eligible for Medicaid because she doesn't have dependent children.         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   Lives in  rented house 2 house mates males keep tp self has her own b room  Doing well      Review of Systems: A 12 point ROS discussed and pertinent positives are indicated in the HPI above.  All other systems are negative.  Review of Systems  Constitutional:       Limited ROS due to mental status  Respiratory: Positive for cough. Negative for shortness of breath.   Cardiovascular: Negative for chest pain.  Gastrointestinal: Positive for abdominal pain.  Musculoskeletal:       "I can't believe how much it hurts" pointing to right leg    Vital Signs: BP 113/75 (BP Location: Left Arm)   Pulse 90   Temp 98.2 F (36.8 C) (Oral)   Resp 13   Ht 5\' 3"  (1.6 m)   Wt 141 lb 9.6 oz (64.2 kg)   SpO2 97%   BMI 25.08 kg/m   Physical Exam  Constitutional: No distress.  Patient appears to be talking to someone on the right side of her bed upon entry to room, easily redirected. Pleasant, answers some questions.  HENT:  Head: Normocephalic.  Cardiovascular: Normal rate, regular rhythm and normal heart  sounds.  Pulmonary/Chest: Effort normal.  Abdominal: Soft. There is tenderness (lower abdomen).  Musculoskeletal:  Mottling LLE, dressings on feet Can move right toes but not left.   Neurological: She is alert.  Skin: Skin is warm and dry. She is not diaphoretic.  Multiple bruises upper extremities  Nursing  note and vitals reviewed.    MD Evaluation Airway: WNL Heart: WNL Abdomen: WNL Chest/ Lungs: Other (comments) Chest/ lungs comments: congested/coughing ASA  Classification: 3 Mallampati/Airway Score: Two   Imaging: Ct Abdomen Pelvis Wo Contrast  Result Date: 07/03/2018 CLINICAL DATA:  Suprapubic mass and pain. EXAM: CT ABDOMEN AND PELVIS WITHOUT CONTRAST TECHNIQUE: Multidetector CT imaging of the abdomen and pelvis was performed following the standard protocol without IV contrast. COMPARISON:  CT scan from 2012. FINDINGS: Lower chest: The lung bases are grossly clear. Moderate breathing motion artifact but no definite infiltrates or effusions. Streaky bibasilar atelectasis. Diastasis of the chest wall possibly related to prior trauma or median sternotomy. Hepatobiliary: No focal hepatic lesions or intrahepatic biliary dilatation. Layering gallstones noted the gallbladder. No common bile duct dilatation. Pancreas: No mass, inflammation or ductal dilatation. Moderate pancreatic atrophy. Spleen: Normal size.  No focal lesions. Adrenals/Urinary Tract: Mildly thickened adrenal glands bilaterally without discrete lesions. No renal calculi or mass. No ureteral calculi. The bladder is being compressed by a large left-sided extra peritoneal pelvic hematoma also involving the abdominal retroperitoneum. This measures approximately 11.3 x 11.7 x 8.5 cm. There is a Foley catheter in the bladder and small amount of air. Stomach/Bowel: Grossly normal. No inflammatory changes, mass lesions or obstructive findings. Small duodenal lipoma noted incidentally. Vascular/Lymphatic: Advanced atherosclerotic calcifications involving the aorta and iliac arteries and branch vessels. No mesenteric or retroperitoneal mass or adenopathy. Reproductive: Grossly normal. Other: Large left-sided retroperitoneal and extraperitoneal pelvic hematoma as detailed above. There is also a small hematoma in the anterior abdominal wall on the  right side. There is also a small left rectus muscle hematoma. Musculoskeletal: No significant bony findings. IMPRESSION: 1. Large left-sided extraperitoneal pelvic hematoma also in the lower abdominal retroperitoneum. Moderate mass effect on the bladder. 2. Small hematoma in the left rectus muscle and also in the right lower subcutaneous abdominal wall. 3. No intra-abdominal/intrapelvic mass or adenopathy. 4. Foley catheter in good position without complicating features. These results will be called to the ordering clinician or representative by the Radiologist Assistant, and communication documented in the PACS or zVision Dashboard. Electronically Signed   By: Rudie Meyer M.D.   On: 07/03/2018 15:38   Dg Abd 1 View - Kub  Result Date: 06/22/2018 CLINICAL DATA:  Orogastric tube placement. EXAM: ABDOMEN - 1 VIEW COMPARISON:  None. FINDINGS: The bowel gas pattern is normal. No radio-opaque calculi or other significant radiographic abnormality are seen. Distal tip of orogastric tube seen in expected position of proximal stomach. IMPRESSION: Distal tip of orogastric tube seen in expected position of proximal stomach. No evidence of bowel obstruction or ileus. Electronically Signed   By: Lupita Raider, M.D.   On: 06/22/2018 12:15   Ct Head Wo Contrast  Result Date: 06/21/2018 CLINICAL DATA:  Altered mental status. EXAM: CT HEAD WITHOUT CONTRAST TECHNIQUE: Contiguous axial images were obtained from the base of the skull through the vertex without intravenous contrast. COMPARISON:  CT head dated June 17, 2018. FINDINGS: Brain: No evidence of acute infarction, hemorrhage, hydrocephalus, extra-axial collection or mass lesion/mass effect. Unchanged chronic right MCA territory encephalomalacia and ex vacuo dilatation of the right lateral ventricle. Vascular: Atherosclerotic  vascular calcification of the carotid siphons. No hyperdense vessel. Skull: Negative. Sinuses/Orbits: No acute finding. Unchanged small  right mastoid effusion. Prior left mastoidectomy. Other: None. IMPRESSION: 1.  No acute intracranial abnormality. 2. Chronic right MCA infarct. Electronically Signed   By: Obie Dredge M.D.   On: 06/21/2018 12:38   Ct Head Wo Contrast  Result Date: 06/17/2018 CLINICAL DATA:  62 year old female status post intraventricular hemorrhage in July. Now presenting with acute PE. Starting on heparin. EXAM: CT HEAD WITHOUT CONTRAST TECHNIQUE: Contiguous axial images were obtained from the base of the skull through the vertex without intravenous contrast. COMPARISON:  Head CTs 04/15/2018 and earlier. FINDINGS: Brain: Completely resolved intraventricular hemorrhage since July. Ex vacuo right lateral ventricle enlargement is related to the chronic right MCA territory encephalomalacia. No ventriculomegaly or transependymal edema. Stable gray-white matter differentiation throughout the brain. No midline shift, ventriculomegaly, mass effect, evidence of mass lesion, intracranial hemorrhage or evidence of cortically based acute infarction. Vascular: Mild Calcified atherosclerosis at the skull base. There is a degree of residual intravascular contrast from the chest CTA earlier today. Skull: Negative. Sinuses/Orbits: Improved paranasal sinus aeration throughout. Chronic left mastoidectomy. Stable mild right mastoid effusion. Other: No acute orbit or scalp soft tissue findings. IMPRESSION: 1. No acute intracranial abnormality. 2. Completely resolved intraventricular hemorrhage since July. 3. Chronic right MCA infarct. Electronically Signed   By: Odessa Fleming M.D.   On: 06/17/2018 21:02   Ct Angio Chest Pe W And/or Wo Contrast  Result Date: 06/17/2018 CLINICAL DATA:  Cough and abnormal breath sounds with hypoxia EXAM: CT ANGIOGRAPHY CHEST WITH CONTRAST TECHNIQUE: Multidetector CT imaging of the chest was performed using the standard protocol during bolus administration of intravenous contrast. Multiplanar CT image reconstructions  and MIPs were obtained to evaluate the vascular anatomy. CONTRAST:  70 mL [id number] COMPARISON:  04/03/2011 FINDINGS: Cardiovascular: Thoracic aorta demonstrates atherosclerotic calcifications as well as changes of prior coronary bypass grafting. Mild cardiac enlargement is seen. Coronary calcifications are noted. The aorta is incompletely opacified precluding adequate evaluation. The pulmonary artery shows a normal branching pattern bilaterally. The left pulmonary artery and its branches are within normal limits. On the right however there is a large clot within the right main pulmonary artery and extending into the upper and lower lobe branches. The RV LV ratio is greater than 1 consistent with a degree of right heart strain. Mediastinum/Nodes: Thoracic inlet is within normal limits. No significant hilar or mediastinal adenopathy is noted. The esophagus as visualized is within normal limits. Lungs/Pleura: Lungs are well aerated bilaterally with emphysematous changes. Small bilateral pleural effusions and dependent atelectatic changes are seen. No findings to suggest pulmonary infarct are noted at this time. Mild alveolar edema is noted particularly in the right upper lobe. No focal confluent infiltrate is seen. Upper Abdomen: Cholelithiasis is noted without complicating factors. No other focal abnormality is noted. Musculoskeletal: Degenerative changes of the thoracic spine are seen. Chronic changes at the costosternal junction on the right is seen. Some mild herniation of abdominal fat is noted inferiorly below the xiphoid stable in appearance from a prior exam from 2012. Review of the MIP images confirms the above findings. IMPRESSION: Positive for acute PE primarily within the right pulmonary arterial system with CT evidence of right heart strain (RV/LV Ratio is greater than 1) consistent with at least submassive (intermediate risk) PE. The presence of right heart strain has been associated with an increased  risk of morbidity and mortality. Please activate Code PE by paging [phone].  Mild alveolar edema is noted particularly in the right upper lobe. Small effusions and bibasilar dependent atelectatic changes. Critical Value/emergent results were called by telephone at the time of interpretation on 06/17/2018 at 6:24 pm to Dr. Virgina Norfolk , who verbally acknowledged these results. Aortic Atherosclerosis (ICD10-I70.0) and Emphysema (ICD10-J43.9). Electronically Signed   By: Alcide Clever M.D.   On: 06/17/2018 18:28   Dg Chest Port 1 View  Result Date: 06/28/2018 CLINICAL DATA:  Acute respiratory failure. EXAM: PORTABLE CHEST 1 VIEW COMPARISON:  One-view chest x-ray 06/27/2018 FINDINGS: Heart is enlarged. Endotracheal tube terminates 3.8 cm above the carina. A right side PICC line is stable. Side port of the NG tube is just beyond the GE junction. Aeration of both lungs is slightly improved. Small effusions and bibasilar atelectasis remain. IMPRESSION: 1. Improving aeration of both lungs. 2. Residual small bilateral pleural effusions and associated atelectasis. 3. Support apparatus stable. Electronically Signed   By: Marin Roberts M.D.   On: 06/28/2018 10:06   Dg Chest Port 1 View  Result Date: 06/27/2018 CLINICAL DATA:  Acute respiratory failure. EXAM: PORTABLE CHEST 1 VIEW COMPARISON:  One-view chest x-ray 06/26/2018 FINDINGS: Endotracheal tube terminates 2.5 cm above the carina. NG tube courses off the inferior border the film. A right-sided PICC line is in place. Heart is enlarged. CABG is noted. Interstitial edema is slightly improved. Bilateral pleural effusions remain. Bibasilar airspace disease likely reflects atelectasis. IMPRESSION: 1. Support apparatus stable and in satisfactory position. 2. Slight decrease in interstitial edema. 3. Cardiomegaly with persistent bilateral effusions and basilar airspace disease, likely atelectasis. Findings are consistent with congestive heart failure.  Electronically Signed   By: Marin Roberts M.D.   On: 06/27/2018 07:28   Dg Chest Port 1 View  Result Date: 06/26/2018 CLINICAL DATA:  Acute respiratory failure EXAM: PORTABLE CHEST 1 VIEW COMPARISON:  06/25/2018 FINDINGS: Cardiac shadow remains enlarged. Postsurgical changes are again seen. The endotracheal tube, right-sided PICC line and nasogastric catheter are again seen and stable. Bilateral pleural effusions are noted. Left retrocardiac consolidation is seen. IMPRESSION: Bilateral pleural effusions with left basilar consolidation. Tubes and lines as described. Electronically Signed   By: Alcide Clever M.D.   On: 06/26/2018 07:15   Dg Chest Port 1 View  Result Date: 06/25/2018 CLINICAL DATA:  Acute respiratory failure. EXAM: PORTABLE CHEST 1 VIEW COMPARISON:  06/24/2018 FINDINGS: Endotracheal tube is 2.0 cm above the carina. PICC line tip in the lower SVC region. Nasogastric tube extends into the abdomen. Persistent basilar chest densities are most compatible with pleural fluid and volume loss. Negative for a pneumothorax. Stable hazy densities in the medial right upper lung. Heart size is upper limits of normal but stable. IMPRESSION: 1. Stable position of the support apparatuses. 2. No significant change in the lung disease. Persistent basilar chest densities are most compatible with pleural fluid and atelectasis/consolidation. Electronically Signed   By: Richarda Overlie M.D.   On: 06/25/2018 09:11   Dg Chest Port 1 View  Result Date: 06/24/2018 CLINICAL DATA:  Insertion of central venous catheter, history of atrial fibrillation and cardiomyopathy, prior CABG with eventual debridement for infection EXAM: PORTABLE CHEST 1 VIEW COMPARISON:  Chest x-ray of 06/24/2018 and CT chest of 06/17/2017 FINDINGS: The tip of the endotracheal tube is very near the carina, less than 1 cm above the carina and withdrawing by 2 cm is recommended. Right central venous line tip is seen to the mid low SVC. No  pneumothorax is seen. Cardiomegaly and mild pulmonary  vascular congestion is present with probable small effusions left-greater-than-right. NG tube extends below the hemidiaphragm. IMPRESSION: 1. Right central venous line tip seen to the lower SVC. No pneumothorax. 2. Cardiomegaly with mild CHF and small effusions, left greater than right. 3. Endotracheal tube tip very near the carina. Recommend retracting by approximately 2 cm. Electronically Signed   By: Dwyane Dee M.D.   On: 06/24/2018 12:04   Dg Chest Port 1 View  Result Date: 06/24/2018 CLINICAL DATA:  Patient admitted 06/17/2018 with hypoxia and pulmonary embolus. Intubated. EXAM: PORTABLE CHEST 1 VIEW COMPARISON:  Single-view of the chest 06/23/2018 and 06/22/2018. FINDINGS: The patient's neck appears to be in flexion. Endotracheal tube is 0.8 cm above the carina. NG tube courses into the stomach and below the inferior margin the film. Left greater than right pleural effusions and basilar airspace disease appear similar to yesterday's exam. Heart size is enlarged. No pneumothorax. Surgical clips right axilla noted. IMPRESSION: No notable change in left greater than right pleural effusions and basilar airspace disease, likely atelectasis. Endotracheal tube tip is 0.8 cm above the carina which may be secondary to the patient's neck being in flexion. Electronically Signed   By: Drusilla Kanner M.D.   On: 06/24/2018 10:05   Portable Chest Xray  Result Date: 06/23/2018 CLINICAL DATA:  Shortness of breath. EXAM: PORTABLE CHEST 1 VIEW COMPARISON:  Radiograph of June 22, 2018. FINDINGS: Stable cardiomegaly. Endotracheal and nasogastric tubes are unchanged in position. Status post coronary artery bypass graft. Increased central pulmonary vascular congestion and bilateral pulmonary edema is noted. Mild to moderate left pleural effusion is noted with associated atelectasis. No pneumothorax is noted. Bony thorax is unremarkable. IMPRESSION: Increased  central pulmonary vascular congestion and bilateral pulmonary edema, with mild to moderate left pleural effusion with associated atelectasis. Stable support apparatus. Electronically Signed   By: Lupita Raider, M.D.   On: 06/23/2018 08:42   Dg Chest Port 1 View  Result Date: 06/22/2018 CLINICAL DATA:  Endotracheal tube placement. EXAM: PORTABLE CHEST 1 VIEW COMPARISON:  Radiograph of same day. FINDINGS: Stable cardiomegaly. Status post coronary artery bypass graft. Endotracheal tube is projected over tracheal air shadow with distal tip 2 cm above the carina. Nasogastric tube is seen entering stomach. No pneumothorax is noted. Mild bibasilar subsegmental atelectasis is noted with probable small pleural effusions. Bony thorax is unremarkable. Right axillary surgical clips are noted. IMPRESSION: Endotracheal tube in grossly good position. Interval placement of nasogastric tube. Probable mild bibasilar subsegmental atelectasis with small pleural effusions. Electronically Signed   By: Lupita Raider, M.D.   On: 06/22/2018 12:14   Dg Chest Port 1 View  Result Date: 06/22/2018 CLINICAL DATA:  Shortness of breath. Aspiration pneumonia. Recent pulmonary emboli. EXAM: PORTABLE CHEST 1 VIEW COMPARISON:  Chest x-rays dated 06/21/2018 and 06/17/2018 and chest CT dated 06/17/2018. FINDINGS: There is new slight hazy perihilar pulmonary vascular congestion bilaterally. Small bilateral effusions, less prominent on right. Overall heart size is normal. CABG. No acute bone abnormality. IMPRESSION: New slight pulmonary vascular congestion. Small bilateral effusions, diminished on the right. Electronically Signed   By: Francene Boyers M.D.   On: 06/22/2018 10:23   Dg Chest Port 1 View  Result Date: 06/21/2018 CLINICAL DATA:  Patient with acute respiratory distress. EXAM: PORTABLE CHEST 1 VIEW COMPARISON:  Chest radiograph 06/17/2018 FINDINGS: Monitoring leads overlie the patient. Stable cardiomegaly. New moderate left  pleural effusion with underlying opacities. Minimal right basilar heterogeneous opacities. IMPRESSION: Cardiomegaly. New moderate left effusion with underlying  opacities which may represent atelectasis or infection. Heterogeneous opacities right lung base may represent atelectasis. Electronically Signed   By: Annia Belt M.D.   On: 06/21/2018 20:33   Dg Chest Portable 1 View  Result Date: 06/17/2018 CLINICAL DATA:  Cough and rhonchi. EXAM: PORTABLE CHEST 1 VIEW COMPARISON:  05/05/2018 FINDINGS: Stable cardiomegaly with post CABG change. Nonaneurysmal slightly atherosclerotic aorta. No overt pulmonary edema, consolidation, effusion or pneumothorax. Mild chronic interstitial prominence redemonstrated of the lungs. No acute osseous abnormality. IMPRESSION: Stable cardiomegaly with minimal aortic atherosclerosis and post CABG change. No active pulmonary disease. Electronically Signed   By: Tollie Eth M.D.   On: 06/17/2018 16:01   Dg Swallowing Func-speech Pathology  Result Date: 06/29/2018 Objective Swallowing Evaluation: Type of Study: MBS-Modified Barium Swallow Study  Patient Details Name: [name] MRN: [id number] Date of Birth: 05/25/1956 Today's Date: 06/29/2018 Time: SLP Start Time (ACUTE ONLY): 1211 -SLP Stop Time (ACUTE ONLY): 1232 SLP Time Calculation (min) (ACUTE ONLY): 21 min Past Medical History: Past Medical History: Diagnosis Date . Acute right MCA stroke (HCC) 11/07/10 . Atrial fibrillation (HCC)   a. s/p TEE-DCCV 02/2104; b. Xarelto started . CAD (coronary artery disease)   a.  cath 09/2010: LAD stent patent, S-Int/dCFX ok, S-PDA ok, L-LAD atretic;  b. Lexiscan Myoview (02/2014):  no ischemia, EF 55%; c. 11/2014 Cath/PCI: LM nl, LAD 20pISR, LCX 80-37m, OM1 nl, RI 70p, RCA 40-52m, RPDA 95ost/95-55m (PTCA only w/ reduction to 50p/27m), 60d, L->LAD atretic, VG->RI->OM nl, VG->PDA 100p. . Cardiomyopathy with EF 40% at TEE 02/17/14 (likely tachycardia mediated - Myoview 02/19/14 neg for ischemia with  normal EF) 02/18/2014 . COPD (chronic obstructive pulmonary disease) (HCC)  . Depression  . Eczema  . GERD (gastroesophageal reflux disease)  . Headache  . HLD (hyperlipidemia)  . Homelessness 11/12/2011 . 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 Hx MRSA infection   Chest wall syndrome post CABG . Hx of CABG  . Hx of transesophageal echocardiography (TEE) for monitoring 11/2010  TEE 11/2010: EF 60-65%, BAE, trivial atrial septal shunt;  right heart cath in 10/2010 with elevated R and L heart pressures and diuretic started . Hypertension  . Hypothyroidism  . Persistent atrial fibrillation 10/2014 . Pulmonary nodules   repeat CT due in 11/2011 . Sleep apnea   recent sleep study  in 04/2014 per chart review  shows no significant OSA Past Surgical History: Past Surgical History: Procedure Laterality Date . BLADDER SURGERY   . CARDIAC CATHETERIZATION  2012 . CARDIOVERSION N/A 02/17/2014  Procedure: CARDIOVERSION;  Surgeon: Pricilla Riffle, MD;  Location: [facility] ENDOSCOPY;  Service: Cardiovascular;  Laterality: N/A; . CARDIOVERSION N/A 09/20/2016  Procedure: CARDIOVERSION;  Surgeon: Lars Masson, MD;  Location: [facility] ENDOSCOPY;  Service: Cardiovascular;  Laterality: N/A; . CHEST WALL RECONSTRUCTION   . CORONARY ARTERY BYPASS GRAFT   . debriment for infection in chest   . HERNIA REPAIR   . KNEE ARTHROSCOPY Bilateral  . LEFT AND RIGHT HEART CATHETERIZATION WITH CORONARY ANGIOGRAM N/A 11/10/2014  Procedure: LEFT AND RIGHT HEART CATHETERIZATION WITH CORONARY ANGIOGRAM;  Surgeon: Marykay Lex, MD;  Location: [facility] CATH LAB;  Service: Cardiovascular;  Laterality: N/A; . Left mastoidectomy   . TEE WITHOUT CARDIOVERSION N/A 02/17/2014  Procedure: TRANSESOPHAGEAL ECHOCARDIOGRAM (TEE);  Surgeon: Pricilla Riffle, MD;  Location: [facility] ENDOSCOPY;  Service: Cardiovascular;  Laterality: N/A; HPI: 62 year old female hx CVA, COPD, GERD, who resides in SNF after recent ICH /  IVH with L sided weakness. Now presenting with hypoxia and  PE. Started on heparin infusion. No h/o dysphagia documented but pt reports a h/o needing thickened liquids. BSE completed this admission on 10/10 with slight cough/throat clear not directly indicative of decreased airway protection. Regular diet/thin liquids recommended; SLP f/u x1 and s/o. On 10/14 pt became progressively lethargic with tachypnea and hypoxia; she was transferred back to ICU and intubated 10/14; one-way extubation 10/20.  Subjective: pt quicker to respond than she was this morning Assessment / Plan / Recommendation CHL IP CLINICAL IMPRESSIONS 06/29/2018 Clinical Impression Pt presents with a moderate oral and mild pharyngeal dysphagia. Her mentation is brighter than it was even this morning, with quicker responses and more prolonged utterances. She has oral holding of all textures, requiringMax faded to Mod verbal cueing to initiated posterior transit. She has piecemeal swallowing, consistently swallowing boluses in two parts, and needing consistent cueing to swallow both parts. Across the study, her timing did begin to improve with cueing. Her pharyngeal phase demonstrated good timing and and strength across consistencies, although she did have a single episode of premature spillage into the airway while consuming thin liquids via straw. When she did aspirate, it was sensed, triggering a cough response with only a second or two of delay. Recommend starting with Dys 1 diet and thin liquids via cup. Prognosis is good for advancement with additional time post-extubation, clearing of mental status, and reintroduction of PO diet. SLP will f/u for tolerance and ready to advance as swallow becomes swifter. SLP Visit Diagnosis Dysphagia, oropharyngeal phase (R13.12) Attention and concentration deficit following -- Frontal lobe and executive function deficit following -- Impact on safety and function Mild aspiration risk   CHL IP TREATMENT RECOMMENDATION 06/29/2018 Treatment Recommendations Therapy as  outlined in treatment plan below   Prognosis 06/29/2018 Prognosis for Safe Diet Advancement Good Barriers to Reach Goals -- Barriers/Prognosis Comment -- CHL IP DIET RECOMMENDATION 06/29/2018 SLP Diet Recommendations Dysphagia 1 (Puree) solids;Thin liquid Liquid Administration via Cup;No straw Medication Administration Whole meds with puree Compensations Minimize environmental distractions;Slow rate;Small sips/bites Postural Changes Seated upright at 90 degrees   CHL IP OTHER RECOMMENDATIONS 06/29/2018 Recommended Consults -- Oral Care Recommendations Oral care BID Other Recommendations --   CHL IP FOLLOW UP RECOMMENDATIONS 06/29/2018 Follow up Recommendations Skilled Nursing facility   [facility] IP FREQUENCY AND DURATION 06/29/2018 Speech Therapy Frequency (ACUTE ONLY) min 2x/week Treatment Duration 2 weeks      CHL IP ORAL PHASE 06/29/2018 Oral Phase Impaired Oral - Pudding Teaspoon -- Oral - Pudding Cup -- Oral - Honey Teaspoon -- Oral - Honey Cup -- Oral - Nectar Teaspoon -- Oral - Nectar Cup -- Oral - Nectar Straw -- Oral - Thin Teaspoon Holding of bolus;Piecemeal swallowing Oral - Thin Cup Holding of bolus;Piecemeal swallowing Oral - Thin Straw Holding of bolus;Piecemeal swallowing;Premature spillage Oral - Puree Holding of bolus;Piecemeal swallowing Oral - Mech Soft Holding of bolus;Piecemeal swallowing Oral - Regular -- Oral - Multi-Consistency -- Oral - Pill Holding of bolus Oral Phase - Comment --  CHL IP PHARYNGEAL PHASE 06/29/2018 Pharyngeal Phase Impaired Pharyngeal- Pudding Teaspoon -- Pharyngeal -- Pharyngeal- Pudding Cup -- Pharyngeal -- Pharyngeal- Honey Teaspoon -- Pharyngeal -- Pharyngeal- Honey Cup -- Pharyngeal -- Pharyngeal- Nectar Teaspoon -- Pharyngeal -- Pharyngeal- Nectar Cup -- Pharyngeal -- Pharyngeal- Nectar Straw -- Pharyngeal -- Pharyngeal- Thin Teaspoon WFL Pharyngeal -- Pharyngeal- Thin Cup WFL Pharyngeal -- Pharyngeal- Thin Straw Penetration/Aspiration before swallow Pharyngeal Material  enters airway, passes BELOW cords  and not ejected out despite cough attempt by patient;Material enters airway, passes BELOW cords then ejected out Pharyngeal- Puree WFL Pharyngeal -- Pharyngeal- Mechanical Soft WFL Pharyngeal -- Pharyngeal- Regular -- Pharyngeal -- Pharyngeal- Multi-consistency -- Pharyngeal -- Pharyngeal- Pill WFL Pharyngeal -- Pharyngeal Comment --  CHL IP CERVICAL ESOPHAGEAL PHASE 06/29/2018 Cervical Esophageal Phase WFL Pudding Teaspoon -- Pudding Cup -- Honey Teaspoon -- Honey Cup -- Nectar Teaspoon -- Nectar Cup -- Nectar Straw -- Thin Teaspoon -- Thin Cup -- Thin Straw -- Puree -- Mechanical Soft -- Regular -- Multi-consistency -- Pill -- Cervical Esophageal Comment -- [name] 06/29/2018, 1:24 PM [name], M.A. CCC-SLP Acute Rehabilitation Services Pager [phone] Office [phone]              [place] Ekg Site Rite  Result Date: 06/23/2018 If [facility] image not attached, placement could not be confirmed due to current cardiac rhythm.   Labs:  CBC: Recent Labs    06/29/18 0911 06/30/18 0349 06/30/18 1006 07/01/18 0418  WBC 6.1 4.4 3.7* 4.5  HGB 9.0* 7.1* 7.6* 8.8*  HCT 28.8* 23.1* 25.4* 28.7*  PLT 181 129* 150 127*    COAGS: Recent Labs    04/14/18 2355  INR 1.18  APTT 29    BMP: Recent Labs    06/28/18 0500 06/29/18 0911 06/30/18 0349 07/01/18 0418  NA 137 138 137 138  K 3.1* 3.6 3.2* 3.6  CL 98 96* 98 99  CO2 30 31 31  32  GLUCOSE 123* 107* 97 86  BUN 16 13 15 17   CALCIUM 8.1* 8.9 8.1* 8.4*  CREATININE 0.75 0.76 0.90 0.96  GFRNONAA >60 >60 >60 >60  GFRAA >60 >60 >60 >60    LIVER FUNCTION TESTS: Recent Labs    06/20/18 0251 06/22/18 0342 06/23/18 0301 07/01/18 0418  BILITOT 0.3 0.5 0.6 0.6  AST 22 20 18 19   ALT 9 8 8 10   ALKPHOS 71 56 51 53  PROT 5.9* 5.2* 4.5* 5.5*  ALBUMIN 2.7* 2.5* 2.2* 2.4*    TUMOR MARKERS: No results for input(s): AFPTM, CEA, CA199, CHROMGRNA in the last 8760 hours.  Assessment and  Plan:  Patient with history of ICH on Xarelto for a.fib who presented to [facility] ED from SNF on 10/9 due to cough and hypoxia. She was found to have acute PE, started on heparin IV and admitted for further management. Lower extremity venous study was performed on 10/14 which showed left lower extremity DVT - request has been made to IR for IVC filter placement prior to return to SNF.   Patient is afebrile, WBC 4.5, no recent INR, RR 17, SpO2 100% on room air. She does have continued cough and congestion. Xarelto was stopped in July 2019 following ICH, heparin IV was stopped 10/19, Lovenox SQ was stopped 10/24 and 1 dose of Eliquis was given 10/25 however this has been d/ced as well.  Case discussed with Dr. Grace Isaac by Dr. Randol Kern - Dr. Grace Isaac agrees to proceed with IVC filter placement which will most likely be permanent given patient's overall debility. Orders for NPO after midnight, CBC and INR in chart.  Risks and benefits discussed with the patient's son including, but not limited to bleeding, infection, contrast induced renal failure, filter fracture or migration which can lead to emergency surgery or even death, strut penetration with damage or irritation to adjacent structures and caval thrombosis.  All of the patient's son's questions were answered, patient's son is agreeable to proceed.  Consent signed and in  IR.  Thank you for this interesting consult.  I greatly enjoyed meeting TANDI HANKO and look forward to participating in their care.  A copy of this report was sent to the requesting provider on this date.  Electronically Signed: Villa Herb, PA-C 07/03/2018, 4:30 PM   I spent a total of 40 Minutes in face to face in clinical consultation, greater than 50% of which was counseling/coordinating care for IVC filter placement.

## 2018-07-04 ENCOUNTER — Encounter (HOSPITAL_COMMUNITY): Payer: Self-pay | Admitting: Interventional Radiology

## 2018-07-04 ENCOUNTER — Inpatient Hospital Stay (HOSPITAL_COMMUNITY): Payer: PPO

## 2018-07-04 DIAGNOSIS — M79605 Pain in left leg: Secondary | ICD-10-CM

## 2018-07-04 DIAGNOSIS — R58 Hemorrhage, not elsewhere classified: Secondary | ICD-10-CM

## 2018-07-04 DIAGNOSIS — M79604 Pain in right leg: Secondary | ICD-10-CM

## 2018-07-04 DIAGNOSIS — N179 Acute kidney failure, unspecified: Secondary | ICD-10-CM

## 2018-07-04 HISTORY — PX: IR IVC FILTER PLMT / S&I /IMG GUID/MOD SED: IMG701

## 2018-07-04 LAB — PROTIME-INR
INR: 1.66
Prothrombin Time: 19.4 seconds — ABNORMAL HIGH (ref 11.4–15.2)

## 2018-07-04 LAB — BASIC METABOLIC PANEL
Anion gap: 13 (ref 5–15)
BUN: 25 mg/dL — ABNORMAL HIGH (ref 6–20)
CALCIUM: 9 mg/dL (ref 8.9–10.3)
CO2: 23 mmol/L (ref 22–32)
CREATININE: 2.01 mg/dL — AB (ref 0.44–1.00)
Chloride: 104 mmol/L (ref 98–111)
GFR calc non Af Amer: 26 mL/min — ABNORMAL LOW (ref >60)
GFR, EST AFRICAN AMERICAN: 30 mL/min — AB (ref >60)
Glucose, Bld: 182 mg/dL — ABNORMAL HIGH (ref 70–99)
Potassium: 4.7 mmol/L (ref 3.5–5.1)
Sodium: 140 mmol/L (ref 135–145)

## 2018-07-04 LAB — CBC
HCT: 25.7 % — ABNORMAL LOW (ref 36.0–46.0)
Hemoglobin: 8.4 g/dL — ABNORMAL LOW (ref 12.0–15.0)
MCH: 28.3 pg (ref 26.0–34.0)
MCHC: 32.7 g/dL (ref 30.0–36.0)
MCV: 86.5 fL (ref 80.0–100.0)
PLATELETS: 230 10*3/uL (ref 150–400)
RBC: 2.97 MIL/uL — ABNORMAL LOW (ref 3.87–5.11)
RDW: 22.1 % — ABNORMAL HIGH (ref 11.5–15.5)
WBC: 16.1 10*3/uL — AB (ref 4.0–10.5)
nRBC: 0 % (ref 0.0–0.2)

## 2018-07-04 LAB — PREPARE RBC (CROSSMATCH)

## 2018-07-04 LAB — MAGNESIUM: MAGNESIUM: 1.5 mg/dL — AB (ref 1.7–2.4)

## 2018-07-04 MED ORDER — LIDOCAINE HCL 1 % IJ SOLN
INTRAMUSCULAR | Status: AC
  Filled 2018-07-04: qty 20

## 2018-07-04 MED ORDER — FENTANYL CITRATE (PF) 100 MCG/2ML IJ SOLN
INTRAMUSCULAR | Status: AC | PRN
  Administered 2018-07-04: 25 ug via INTRAVENOUS

## 2018-07-04 MED ORDER — SODIUM CHLORIDE 0.9 % IV BOLUS
1000.0000 mL | Freq: Once | INTRAVENOUS | Status: AC
  Administered 2018-07-04: 1000 mL via INTRAVENOUS

## 2018-07-04 MED ORDER — MIDAZOLAM HCL 2 MG/2ML IJ SOLN
INTRAMUSCULAR | Status: AC
  Filled 2018-07-04: qty 2

## 2018-07-04 MED ORDER — FENTANYL CITRATE (PF) 100 MCG/2ML IJ SOLN
INTRAMUSCULAR | Status: AC
  Filled 2018-07-04: qty 2

## 2018-07-04 MED ORDER — MIDAZOLAM HCL 2 MG/2ML IJ SOLN
INTRAMUSCULAR | Status: AC | PRN
  Administered 2018-07-04: 0.5 mg via INTRAVENOUS

## 2018-07-04 MED ORDER — LIDOCAINE HCL (PF) 1 % IJ SOLN
INTRAMUSCULAR | Status: AC | PRN
  Administered 2018-07-04: 5 mL

## 2018-07-04 MED ORDER — SODIUM CHLORIDE 0.9 % IV SOLN
INTRAVENOUS | Status: AC | PRN
  Administered 2018-07-04: 10 mL/h via INTRAVENOUS

## 2018-07-04 MED ORDER — SODIUM CHLORIDE 0.9% IV SOLUTION
Freq: Once | INTRAVENOUS | Status: AC
  Administered 2018-07-04: 21:00:00 via INTRAVENOUS

## 2018-07-04 MED ORDER — MAGNESIUM SULFATE IN D5W 1-5 GM/100ML-% IV SOLN
1.0000 g | Freq: Once | INTRAVENOUS | Status: AC
  Administered 2018-07-04: 1 g via INTRAVENOUS
  Filled 2018-07-04: qty 100

## 2018-07-04 NOTE — Consult Note (Addendum)
Hospital Consult    Reason for Consult:  Mottling BLE Requesting Physician:  Dr. Randol Kern MRN #:  [id number]  History of Present Illness: This is a 62 y.o. female with history of dementia, CHF, hypertension, CVA, COPD, who resides in a skilled nursing facility after recent intracranial hemorrhage while on Xarelto for atrial fibrillation.  Anticoagulation was discontinued and outpatient was admitted to the hospital for treatment of left lower extremity DVT and pulmonary embolism.  She is now thought to be in acute oliguric renal failure.  She is a very poor historian with no family present on exam.  She is scheduled for placement of IVC filter by interventional radiology today.  She is being seen in consultation for evaluation of mottling of bilateral lower extremities.  She is able to tell me that she is in pain however does not specify where this pain is coming from.  Patient clearly not oriented and continues to pull on her leads, heart monitor lines, and IV lines.  Per nursing staff patient has been in bed for 2 weeks and is nonambulatory.  Past Medical History:  Diagnosis Date  . Acute right MCA stroke (HCC) 11/07/10  . Atrial fibrillation (HCC)    a. s/p TEE-DCCV 02/2104; b. Xarelto started  . CAD (coronary artery disease)    a.  cath 09/2010: LAD stent patent, S-Int/dCFX ok, S-PDA ok, L-LAD atretic;  b. Lexiscan Myoview (02/2014):  no ischemia, EF 55%; c. 11/2014 Cath/PCI: LM nl, LAD 20pISR, LCX 80-50m, OM1 nl, RI 70p, RCA 40-37m, RPDA 95ost/95-80m (PTCA only w/ reduction to 50p/68m), 60d, L->LAD atretic, VG->RI->OM nl, VG->PDA 100p.  . Cardiomyopathy with EF 40% at TEE 02/17/14 (likely tachycardia mediated - Myoview 02/19/14 neg for ischemia with normal EF) 02/18/2014  . COPD (chronic obstructive pulmonary disease) (HCC)   . Depression   . Eczema   . GERD (gastroesophageal reflux disease)   . Headache   . HLD (hyperlipidemia)   . Homelessness 11/12/2011  . 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 Hx of CABG   . Hx of transesophageal echocardiography (TEE) for monitoring 11/2010   TEE 11/2010: EF 60-65%, BAE, trivial atrial septal shunt;  right heart cath in 10/2010 with elevated R and L heart pressures and diuretic started  . Hypertension   . Hypothyroidism   . Persistent atrial fibrillation 10/2014  . Pulmonary nodules    repeat CT due in 11/2011  . Sleep apnea    recent sleep study  in 04/2014 per chart review  shows no significant OSA    Past Surgical History:  Procedure Laterality Date  . BLADDER SURGERY    . CARDIAC CATHETERIZATION  2012  . CARDIOVERSION N/A 02/17/2014   Procedure: CARDIOVERSION;  Surgeon: Pricilla Riffle, MD;  Location: [facility] ENDOSCOPY;  Service: Cardiovascular;  Laterality: N/A;  . CARDIOVERSION N/A 09/20/2016   Procedure: CARDIOVERSION;  Surgeon: Lars Masson, MD;  Location: [facility] ENDOSCOPY;  Service: Cardiovascular;  Laterality: N/A;  . CHEST WALL RECONSTRUCTION    . CORONARY ARTERY BYPASS GRAFT    . debriment for infection in chest    . HERNIA REPAIR    . KNEE ARTHROSCOPY Bilateral   . LEFT AND RIGHT HEART CATHETERIZATION WITH CORONARY ANGIOGRAM N/A 11/10/2014   Procedure: LEFT AND RIGHT HEART CATHETERIZATION WITH CORONARY ANGIOGRAM;  Surgeon: Marykay Lex, MD;  Location: [facility] CATH LAB;  Service: Cardiovascular;  Laterality: N/A;  . Left mastoidectomy    . TEE WITHOUT CARDIOVERSION N/A 02/17/2014   Procedure: TRANSESOPHAGEAL ECHOCARDIOGRAM (TEE);  Surgeon: Pricilla Riffle, MD;  Location: [facility] ENDOSCOPY;  Service: Cardiovascular;  Laterality: N/A;    Allergies  Allergen Reactions  . Avelox [Moxifloxacin Hcl In Nacl] Other (See Comments)    Mental breakdown  . Amoxicillin Hives  . Pamelor [Nortriptyline Hcl] Other (See Comments)    Made her want to hurt herself  . Penicillins Hives     Has patient had a PCN reaction causing immediate rash, facial/tongue/throat swelling,  SOB or lightheadedness with hypotension: Yes Has patient had a PCN reaction causing severe rash involving mucus membranes or skin necrosis: Unk Has patient had a PCN reaction that required hospitalization: Unk Has patient had a PCN reaction occurring within the last 10 years: Unk If all of the above answers are "NO", then may proceed with Cephalosporin use.   . Other Itching    Any med that ends in -caine  . Hydrocodone Itching  . Oxycodone Itching  . Sulfa Antibiotics Other (See Comments)    "Allergic," per [facility]    Prior to Admission medications   Medication Sig Start Date End Date Taking? Authorizing Provider  atorvastatin (LIPITOR) 40 MG tablet Take 1 tablet (40 mg total) by mouth daily at 6 PM. 03/24/18  Yes Biby, Jani Files, NP  cetirizine (ZYRTEC) 10 MG tablet Take 10 mg by mouth daily.   Yes [provider]  dicyclomine (BENTYL) 20 MG tablet Take 20 mg by mouth 2 (two) times daily.   Yes [provider]  diphenoxylate-atropine (LOMOTIL) 2.5-0.025 MG tablet Take 1 tablet by mouth 3 (three) times daily as needed for diarrhea or loose stools.    Yes [provider]  ENSURE (ENSURE) Take 237 mLs by mouth 3 (three) times daily between meals.   Yes [provider]  FLUoxetine (PROZAC) 20 MG capsule Take 20 mg by mouth daily.    Yes [provider]  gabapentin (NEURONTIN) 300 MG capsule Take 300 mg by mouth 3 (three) times daily.    Yes [provider]  lisinopril (PRINIVIL,ZESTRIL) 10 MG tablet Take 10 mg by mouth every evening.    Yes [provider]  ondansetron (ZOFRAN) 4 MG tablet Take 4 mg by mouth every 6 (six) hours as needed for nausea or vomiting.    Yes [provider]  pantoprazole (PROTONIX) 40 MG tablet Take 1 tablet (40 mg total) by mouth daily. 05/09/18  Yes Burnadette Pop, MD    Social History   Socioeconomic History  . Marital status: Divorced    Spouse name: Not on file  . Number of children: 2  .  Years of education: Not on file  . Highest education level: Not on file  Occupational History  . Occupation: DISABLE   Social Needs  . Financial resource strain: Not on file  . Food insecurity:    Worry: Not on file    Inability: Not on file  . Transportation needs:    Medical: Not on file    Non-medical: Not on file  Tobacco Use  . Smoking status: Current Every Day Smoker    Packs/day: 0.50    Years: 46.00    Pack years: 23.00    Types: Cigarettes  . Smokeless tobacco: Never Used  . Tobacco comment: 05/05/2018 "quit a couple weeks ago"  Substance and Sexual Activity  . Alcohol use: No    Alcohol/week:  0.0 standard drinks  . Drug use: No  . Sexual activity: Not Currently  Lifestyle  . Physical activity:    Days per week: Not on file    Minutes per session: Not on file  . Stress: Not on file  Relationships  . Social connections:    Talks on phone: Not on file    Gets together: Not on file    Attends religious service: Not on file    Active member of club or organization: Not on file    Attends meetings of clubs or organizations: Not on file    Relationship status: Not on file  . Intimate partner violence:    Fear of current or ex partner: Not on file    Emotionally abused: Not on file    Physically abused: Not on file    Forced sexual activity: Not on file  Other Topics Concern  . Not on file  Social History Narrative   8/11   Divorced   After stroke was living with sister and mother after sister asked her to leave. Mom has since deceased    Difficulties over control.    Then moved back in for about 6 weeks.   . tranportaion difficult son  helping   Ex-smoker   Was living at friends  Sister and her had argument and she was told to leave .    She was living in a homeless shelter for the last 3 weeks [facility]    son had help with transportation and medication       Work status regular  before got sick on short-term disability now lost t insurance after the  stroke and couldn't work was  denied ssi    2 times.  She is not eligible for Medicaid because she doesn't have dependent children.         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   Lives in  rented house 2 house mates males keep tp self has her own b room  Doing well      Family History  Problem Relation Age of Onset  . COPD Mother   . Heart disease Mother   . Arthritis Mother        Rheumatoid and PMR  . Osteoporosis Mother        Mom fractured hip  . Diabetes type II Mother   . Depression Mother   . Heart attack Father   . Depression Father   . Hypertension Father   . Alcohol abuse Father   . Depression Sister   . Anxiety disorder Sister   . Drug abuse Sister   . Stroke Neg Hx     ROS: Otherwise negative unless mentioned in HPI  Physical Examination  Vitals:   07/04/18 0935 07/04/18 0936  BP: 125/81   Pulse:  90  Resp:    Temp:    SpO2:     Body mass index is 24.57 kg/m.  General:  Ill appearing female alert but not oriented Gait: Not observed HENT: WNL, normocephalic Pulmonary: normal non-labored breathing with O2 by nasal cannula Abdomen:  soft, NT/ND, no masses Skin: without rashes Vascular Exam/Pulses: Impressive mottling of bilateral lower extremity's to the level of the upper thighs; soft femoral pulses  right greater than left; monophasic right AT and PT by Doppler; soft monophasic left PT by Doppler;  Musculoskeletal: muscle wasting or atrophy of large muscle groups in bilateral lower extremities  Neurologic: Alert but not oriented; unaware of plans for IVC filter Lymph:  Unremarkable  CBC    Component Value Date/Time   WBC 16.1 (H) 07/04/2018 0545   RBC 2.97 (L) 07/04/2018 0545   HGB 8.4 (L) 07/04/2018 0545   HGB 12.8 09/13/2016 1315    HCT 25.7 (L) 07/04/2018 0545   HCT 37.9 09/13/2016 1315   PLT 230 07/04/2018 0545   PLT 201 09/13/2016 1315   MCV 86.5 07/04/2018 0545   MCV 95 09/13/2016 1315   MCH 28.3 07/04/2018 0545   MCHC 32.7 07/04/2018 0545   RDW 22.1 (H) 07/04/2018 0545   RDW 14.4 09/13/2016 1315   LYMPHSABS 0.9 06/17/2018 1515   LYMPHSABS 1.6 09/13/2016 1315   MONOABS 0.4 06/17/2018 1515   EOSABS 0.2 06/17/2018 1515   EOSABS 0.2 09/13/2016 1315   BASOSABS 0.0 06/17/2018 1515   BASOSABS 0.0 09/13/2016 1315    BMET    Component Value Date/Time   NA 140 07/04/2018 0545   NA 144 03/27/2017 1000   K 4.7 07/04/2018 0545   CL 104 07/04/2018 0545   CO2 23 07/04/2018 0545   GLUCOSE 182 (H) 07/04/2018 0545   BUN 25 (H) 07/04/2018 0545   BUN 22 03/27/2017 1000   CREATININE 2.01 (H) 07/04/2018 0545   CREATININE 1.07 03/31/2015 1306   CALCIUM 9.0 07/04/2018 0545   GFRNONAA 26 (L) 07/04/2018 0545   GFRNONAA 58 (L) 03/31/2015 1306   GFRAA 30 (L) 07/04/2018 0545   GFRAA 67 03/31/2015 1306    COAGS: Lab Results  Component Value Date   INR 1.66 07/04/2018   INR 1.18 04/14/2018   INR 1.4 (H) 09/13/2016     Non-Invasive Vascular Imaging:   No vascular imaging noted   ASSESSMENT/PLAN: This is a 62 y.o. female with impressive mottling of bilateral lower extremities  - Soft Doppler signals to the level of the ankle and bilateral lower examinee's however patient with impressive mottling - Given overall clinical picture including poor functional status, dementia, and acute renal failure, patient would not be a candidate for any surgical revascularization  -Vascular surgery will sign off for now -If bilateral lower extremities worsen and become nonviable, would recommend above-the-knee amputations with consent given by POA -Dr. Chestine Spore on-call vascular surgeon also evaluated the patient and will provide further recommendations   Emilie Rutter PA-C [facility] [phone]  I have  seen and evaluated the patient. I agree with the PA note as documented above.  Complicated situation given 62 year old female who had a brain bleed 5 months ago and has severe left-sided hemiparalysis and is nonambulatory.  Most recently admitted to the ICU for PEs and DVTs on anticoagulation as well as underlying pneumonia and then developed large retroperitoneal hematoma and cannot have any anticoagulation at this time.  Called today due to mottling noted of the bilateral lower extremities over the last several days.  On exam she has a right femoral pulse and a weaker left femoral pulse as well as biphasic dorsalis pedis signal on the right and a monophasic posterior tibial signal on the left.  She cannot move or feel her left leg which is a chronic deficit from her stroke.  Her right leg she can move and states it feels normal even though it appears mottled.  She does  have evidence of ischemia to her bilateral lower extremities and the etiology is unclear whether it is from a low flow state and/or thrombotic and/or embolic process.  Would need CTA chest abdomen pelvis and runoff to further evaluate but given she is now oliguric with acute renal failure discussed with hospitalist we would defer CTA.  I think given the complexity of her situation and that she cannot be anticoagulated with her large retroperitoneal hematoma conservative measures would be in her best interest and discussed with the patient that she is at high risk for limb loss if this progresses given the severe mottling on exam.  Given the fact that she is nonambulatory from her stroke it is reasonable that we are not aggressive with limb salvage in this situation especially since she appears to be very deconditioned and ill. May end up needing amputations.   Cephus Shelling, MD [facility] Office: [phone] Pager: [phone]  Cephus Shelling

## 2018-07-04 NOTE — Progress Notes (Signed)
Notified provider of slightly worsening appearance of leg mottling and no urine output.  Lasix had not yet been given due to complexity of patient care needs.  Provider wants it held at this time due to concern of catheter not being able to drain.  Pt does not appear to be SOB.  Remains confused, ashen color, with intermittent bouts of rapid breathing.  Extremities are cold.  Covered pt and increased temperature in room.  Pedal pulses are palpable.  Provider also wants cardizem held due to her hypotension.  Wants to be notified if pt has any sustained tachycardia.  Completed 2nd unit of blood.  Report given to oncoming nurse and charge nurse updated.

## 2018-07-04 NOTE — Procedures (Signed)
Pre procedural Dx: DVT with contraindication to anti-coagulation Post Procedural Dx: Same  Successful placement of an infrarenal IVC filter via the right IJ.  EBL: None  Complications: None immediate  Katherina Right, MD Pager #: [phone]

## 2018-07-04 NOTE — Progress Notes (Signed)
PROGRESS NOTE                                                                                                                                                                                                             Patient Demographics:    [name], is a 62 y.o. female, DOB - 1955-11-13, NKN:[id number]  Admit date - 06/17/2018   Admitting Physician Aldean Jewett, MD  Outpatient Primary MD for the patient is Panosh, Standley Brooking, MD  LOS - 81   Chief Complaint  Patient presents with  . Cough  . Shortness of Breath       Brief Narrative    62 year old female w/ a hx of tobacco abuse, dementia, sleep apnea, systolic CHF, HTN, Hypothyroidism, CVA, and COPD who resides in a SNF after a recent ICH/ IVH while on Xarelto for A. fib resulting in L sided weakness who presented with hypoxia and and was found to have PE.    Significant Events: 10/9 > Admit Acute PE 10/10 >Speech Evaluation >Mild aspiration Risk >No aspiration Noted  10/11 >Transfer to [facility]  10/13: Increased tachycardia, tachypnea and respiratory distress rapid response called, more agitated, recurrent atrial fibrillation with rapid ventricular response. 10/14: Critical care reconsulted for respiratory distress, intubated 10/20: one way extubation 10/25: CT abdomen pelvis significant for large extraperitoneal/retroperitoneal/abdominal wall bleed.   Subjective:    [name] today and episodes of increased work of breathing overnight, complains of abdominal pain, as well she had hypotension overnight .   Assessment  & Plan :    Active Problems:   Atrial fibrillation with rapid ventricular response s/p TEE-DCCV (02/17/14)   Pulmonary embolism (HCC)   Pressure injury of skin   Acute respiratory failure with hypoxia (HCC)  Acute pulmonary embolism - with increased RV/LV ratio - Acute Hypoxic resp failure  -off anticoagulation since ICH in July - lytic tx was not  indicated -Initially on Lovenox for anticoagulation, then she was transitioned to Eliquis, currently off all forms of anticoagulation secondary to large life-threatening retroperitoneal/extraperitoneal/abdominal wall hematoma . -Plan to proceed with IVC filter today, to prevent progression of her PE, in the setting of known left lower extremity DVT.  Bilateral lower extremity ischemia -Lower extremity appears to be mottled today, with significantly diminished pulses, and clammy, evidence of gangrene. -This is most likely  in the setting of perfusion status secondary to profound anemia, and hypotension. -Have discussed with vascular who will evaluate, but at this point in the setting of acute renal failure, overall poor functional status and found patient, unlikely acute intervention is determined, per my discussion with the vascular, supportive, operative management would be the post option right now.  Extraperitoneal/retroperitoneal abdominal bleed with abdominal wall hematoma -Is most likely in the setting of anticoagulation secondary to acute PE and left lower extremity DVT. -Currently anticoagulation on hold, continue with supportive transfusion, transfused 2 units PRBC overnight.  Oliguric acute renal failure -Most likely in the setting of ATN, secondary to hypotension overnight, appears to be oliguric, with only 40 cc output over last 24 hours, will avoid nephrotoxic medications, will give fluid bolus and volume resuscitation.  Transfuse blood as needed -Urology input appreciated, for abdominal bleed causing bladder compression, but no outlet obstruction  Pseudomonas PNA - Completed course of cefepime - afebrile w/ normal WBC   Metabolic encephalopathy - patient with baseline poor mental status secondary to IVH, complicated by long hospital stay, delirium.  Atrial fibrillation with RVR - A. fib with RVR hard to control, especially in the setting of hypotension, now blood pressure has  improved, as well heart rate is improved after adding Toprol-XL, so for now continue with Cardizem and Toprol-XL . -Please see above discussion about anticoagulation . [name] Klebsiella urinary tract infection - tx completed   Anemia -Acute blood loss anemia in the setting of intra-abdominal bleed, continue with supportive care with transfusions, repeat CBC in the afternoon and transfuse if needed, she was transfused 2 units PRBC yesterday.   Constipation: Already on Colace.  Responded to MiraLAX  COPD without acute exacerbation Using Xopenex so as not to exacerbate RVR - no acute exacerbation   CAD with history of CABG  Hyperlipidemia  History of right MCA strokewith residual L sided weakness  IVHJuly 2019 resolved on CT head this admission  Pressure injuries left heel and left elbow WOC suggestions being followed   Goals of care -I have discussed with son, updated him about her multiple comorbidities and decline in her medical status, and new acute renal failure, abdominal bleed, I have started conversation with him about goals of care, she is DNR, they understand her prognosis is very poor , especially in the setting with intra-abdominal acute bleed and subacute PE, as well developing renal failure, palliative medicine has been consulted, and they understand she continues to decline, and may not survive this hospitalization.  And we have started the discussion about hospice and comfort care.    Code Status : DNR  Family Communication  : Discussed with son via phone today  Disposition Plan  : Pending further work up, remains in stepdown   Consults  :  PCCM, urology,vascular ,IR  Procedures  : None  DVT Prophylaxis  : Right lower extremity SCD, Lovenox stopped 07/03/2018  Lab Results  Component Value Date   PLT 230 07/04/2018    Antibiotics  :    Anti-infectives (From admission, onward)   Start     Dose/Rate Route Frequency Ordered Stop   06/25/18 2200   ceFEPIme (MAXIPIME) 2 g in sodium chloride 0.9 % 100 mL IVPB     2 g 200 mL/hr over 30 Minutes Intravenous Every 12 hours 06/25/18 1012 06/28/18 2258   06/25/18 0000  vancomycin (VANCOCIN) IVPB 750 mg/150 ml premix  Status:  Discontinued     750 mg 150 mL/hr over 60 Minutes  Intravenous Every 24 hours 06/24/18 1309 06/25/18 0926   06/24/18 2100  ceFEPIme (MAXIPIME) 1 g in sodium chloride 0.9 % 100 mL IVPB  Status:  Discontinued     1 g 200 mL/hr over 30 Minutes Intravenous Every 12 hours 06/24/18 1313 06/25/18 1012   06/24/18 1400  ceFEPIme (MAXIPIME) 2 g in sodium chloride 0.9 % 100 mL IVPB  Status:  Discontinued     2 g 200 mL/hr over 30 Minutes Intravenous Every 8 hours 06/24/18 1122 06/24/18 1313   06/22/18 2300  vancomycin (VANCOCIN) IVPB 750 mg/150 ml premix  Status:  Discontinued     750 mg 150 mL/hr over 60 Minutes Intravenous Every 12 hours 06/22/18 1049 06/24/18 1309   06/22/18 1200  metroNIDAZOLE (FLAGYL) IVPB 500 mg  Status:  Discontinued     500 mg 100 mL/hr over 60 Minutes Intravenous Every 8 hours 06/22/18 0815 06/24/18 1122   06/22/18 1100  vancomycin (VANCOCIN) 1,250 mg in sodium chloride 0.9 % 250 mL IVPB     1,250 mg 166.7 mL/hr over 90 Minutes Intravenous  Once 06/22/18 1030 06/22/18 1929   06/22/18 0600  metroNIDAZOLE (FLAGYL) IVPB 500 mg  Status:  Discontinued     500 mg 100 mL/hr over 60 Minutes Intravenous Every 8 hours 06/22/18 0543 06/22/18 0815   06/22/18 0545  ceFEPIme (MAXIPIME) 1 g in sodium chloride 0.9 % 100 mL IVPB  Status:  Discontinued     1 g 200 mL/hr over 30 Minutes Intravenous Every 12 hours 06/22/18 0543 06/24/18 1122   06/19/18 0100  cefTRIAXone (ROCEPHIN) 1 g in sodium chloride 0.9 % 100 mL IVPB     1 g 200 mL/hr over 30 Minutes Intravenous Every 24 hours 06/18/18 1004 06/22/18 1928   06/17/18 2130  cefTRIAXone (ROCEPHIN) 2 g in sodium chloride 0.9 % 100 mL IVPB  Status:  Discontinued     2 g 200 mL/hr over 30 Minutes Intravenous Every 24 hours  06/17/18 2002 06/18/18 1004        Objective:   Vitals:   07/04/18 0740 07/04/18 0755 07/04/18 0935 07/04/18 0936  BP:  115/84 125/81   Pulse:  88  90  Resp:  (!) 22    Temp:  99.5 F (37.5 C)    TempSrc:  Oral    SpO2: 97%     Weight:      Height:        Wt Readings from Last 3 Encounters:  07/04/18 62.9 kg  05/06/18 68 kg  04/27/18 68.2 kg     Intake/Output Summary (Last 24 hours) at 07/04/2018 1124 Last data filed at 07/04/2018 0630 Gross per 24 hour  Intake 647.4 ml  Output 90 ml  Net 557.4 ml     Physical Exam  Awake, confused, appears to be mildly uncomfortable  Symmetrical Chest wall movement, just at the bases, but no wheezing  Irregular irregular,No Gallops,Rubs or new Murmurs, No Parasternal Heave +ve B.Sounds, has abdominal pain, mainly in the suprapubic area, where there is a mass felt in the abdomen wall, which is most likely her hematoma. Bilateral lower extremity mottled, with diminished distal pulses.    Data Review:    CBC Recent Labs  Lab 06/30/18 0349 06/30/18 1006 07/01/18 0418 07/03/18 1631 07/04/18 0545  WBC 4.4 3.7* 4.5 9.5 16.1*  HGB 7.1* 7.6* 8.8* 7.1* 8.4*  HCT 23.1* 25.4* 28.7* 22.3* 25.7*  PLT 129* 150 127* 192 230  MCV 98.7 98.8 95.7 96.1 86.5  MCH 30.3  29.6 29.3 30.6 28.3  MCHC 30.7 29.9* 30.7 31.8 32.7  RDW 17.1* 16.9* 18.3* 18.3* 22.1*    Chemistries  Recent Labs  Lab 06/29/18 0911 06/30/18 0349 07/01/18 0418 07/03/18 1631 07/04/18 0545  NA 138 137 138 139 140  K 3.6 3.2* 3.6 3.9 4.7  CL 96* 98 99 105 104  CO2 31 31 32 26 23  GLUCOSE 107* 97 86 164* 182*  BUN '13 15 17 18 ' 25*  CREATININE 0.76 0.90 0.96 1.33* 2.01*  CALCIUM 8.9 8.1* 8.4* 8.7* 9.0  MG  --   --  1.4*  --  1.5*  AST  --   --  19  --   --   ALT  --   --  10  --   --   ALKPHOS  --   --  53  --   --   BILITOT  --   --  0.6  --   --     ------------------------------------------------------------------------------------------------------------------ No results for input(s): CHOL, HDL, LDLCALC, TRIG, CHOLHDL, LDLDIRECT in the last 72 hours.  Lab Results  Component Value Date   HGBA1C 5.1 03/21/2018   ------------------------------------------------------------------------------------------------------------------ No results for input(s): TSH, T4TOTAL, T3FREE, THYROIDAB in the last 72 hours.  Invalid input(s): FREET3 ------------------------------------------------------------------------------------------------------------------ No results for input(s): VITAMINB12, FOLATE, FERRITIN, TIBC, IRON, RETICCTPCT in the last 72 hours.  Coagulation profile Recent Labs  Lab 07/04/18 0545  INR 1.66    No results for input(s): DDIMER in the last 72 hours.  Cardiac Enzymes No results for input(s): CKMB, TROPONINI, MYOGLOBIN in the last 168 hours.  Invalid input(s): CK ------------------------------------------------------------------------------------------------------------------    Component Value Date/Time   BNP 206.5 (H) 06/18/2018 0308    Inpatient Medications  Scheduled Meds: . budesonide (PULMICORT) nebulizer solution  0.5 mg Nebulization BID  . chlorhexidine gluconate (MEDLINE KIT)  15 mL Mouth Rinse BID  . Chlorhexidine Gluconate Cloth  6 each Topical Daily  . diltiazem  120 mg Oral Q12H  . docusate  100 mg Oral BID  . feeding supplement (ENSURE ENLIVE)  237 mL Oral BID BM  . furosemide  20 mg Intravenous Once  . mouth rinse  15 mL Mouth Rinse q12n4p  . metoprolol succinate  12.5 mg Oral Daily  . oxybutynin  5 mg Oral TID  . pantoprazole sodium  40 mg Oral Q1200  . povidone-iodine   Topical BID  . QUEtiapine  25 mg Oral BID   Continuous Infusions: . sodium chloride 75 mL/hr at 07/03/18 1545  . magnesium sulfate 1 - 4 g bolus IVPB    . sodium chloride 1,000 mL (07/04/18 0931)   PRN Meds:.fentaNYL  (SUBLIMAZE) injection, levalbuterol, metoprolol tartrate, sodium chloride flush  Micro Results No results found for this or any previous visit (from the past 240 hour(s)).  Radiology Reports Ct Abdomen Pelvis Wo Contrast  Result Date: 07/03/2018 CLINICAL DATA:  Suprapubic mass and pain. EXAM: CT ABDOMEN AND PELVIS WITHOUT CONTRAST TECHNIQUE: Multidetector CT imaging of the abdomen and pelvis was performed following the standard protocol without IV contrast. COMPARISON:  CT scan from 2012. FINDINGS: Lower chest: The lung bases are grossly clear. Moderate breathing motion artifact but no definite infiltrates or effusions. Streaky bibasilar atelectasis. Diastasis of the chest wall possibly related to prior trauma or median sternotomy. Hepatobiliary: No focal hepatic lesions or intrahepatic biliary dilatation. Layering gallstones noted the gallbladder. No common bile duct dilatation. Pancreas: No mass, inflammation or ductal dilatation. Moderate pancreatic atrophy. Spleen: Normal size.  No focal  lesions. Adrenals/Urinary Tract: Mildly thickened adrenal glands bilaterally without discrete lesions. No renal calculi or mass. No ureteral calculi. The bladder is being compressed by a large left-sided extra peritoneal pelvic hematoma also involving the abdominal retroperitoneum. This measures approximately 11.3 x 11.7 x 8.5 cm. There is a Foley catheter in the bladder and small amount of air. Stomach/Bowel: Grossly normal. No inflammatory changes, mass lesions or obstructive findings. Small duodenal lipoma noted incidentally. Vascular/Lymphatic: Advanced atherosclerotic calcifications involving the aorta and iliac arteries and branch vessels. No mesenteric or retroperitoneal mass or adenopathy. Reproductive: Grossly normal. Other: Large left-sided retroperitoneal and extraperitoneal pelvic hematoma as detailed above. There is also a small hematoma in the anterior abdominal wall on the right side. There is also a  small left rectus muscle hematoma. Musculoskeletal: No significant bony findings. IMPRESSION: 1. Large left-sided extraperitoneal pelvic hematoma also in the lower abdominal retroperitoneum. Moderate mass effect on the bladder. 2. Small hematoma in the left rectus muscle and also in the right lower subcutaneous abdominal wall. 3. No intra-abdominal/intrapelvic mass or adenopathy. 4. Foley catheter in good position without complicating features. These results will be called to the ordering clinician or representative by the Radiologist Assistant, and communication documented in the PACS or zVision Dashboard. Electronically Signed   By: Marijo Sanes M.D.   On: 07/03/2018 15:38   Dg Abd 1 View - Kub  Result Date: 06/22/2018 CLINICAL DATA:  Orogastric tube placement. EXAM: ABDOMEN - 1 VIEW COMPARISON:  None. FINDINGS: The bowel gas pattern is normal. No radio-opaque calculi or other significant radiographic abnormality are seen. Distal tip of orogastric tube seen in expected position of proximal stomach. IMPRESSION: Distal tip of orogastric tube seen in expected position of proximal stomach. No evidence of bowel obstruction or ileus. Electronically Signed   By: Marijo Conception, M.D.   On: 06/22/2018 12:15   Ct Head Wo Contrast  Result Date: 06/21/2018 CLINICAL DATA:  Altered mental status. EXAM: CT HEAD WITHOUT CONTRAST TECHNIQUE: Contiguous axial images were obtained from the base of the skull through the vertex without intravenous contrast. COMPARISON:  CT head dated June 17, 2018. FINDINGS: Brain: No evidence of acute infarction, hemorrhage, hydrocephalus, extra-axial collection or mass lesion/mass effect. Unchanged chronic right MCA territory encephalomalacia and ex vacuo dilatation of the right lateral ventricle. Vascular: Atherosclerotic vascular calcification of the carotid siphons. No hyperdense vessel. Skull: Negative. Sinuses/Orbits: No acute finding. Unchanged small right mastoid effusion. Prior  left mastoidectomy. Other: None. IMPRESSION: 1.  No acute intracranial abnormality. 2. Chronic right MCA infarct. Electronically Signed   By: Titus Dubin M.D.   On: 06/21/2018 12:38   Ct Head Wo Contrast  Result Date: 06/17/2018 CLINICAL DATA:  62 year old female status post intraventricular hemorrhage in July. Now presenting with acute PE. Starting on heparin. EXAM: CT HEAD WITHOUT CONTRAST TECHNIQUE: Contiguous axial images were obtained from the base of the skull through the vertex without intravenous contrast. COMPARISON:  Head CTs 04/15/2018 and earlier. FINDINGS: Brain: Completely resolved intraventricular hemorrhage since July. Ex vacuo right lateral ventricle enlargement is related to the chronic right MCA territory encephalomalacia. No ventriculomegaly or transependymal edema. Stable gray-white matter differentiation throughout the brain. No midline shift, ventriculomegaly, mass effect, evidence of mass lesion, intracranial hemorrhage or evidence of cortically based acute infarction. Vascular: Mild Calcified atherosclerosis at the skull base. There is a degree of residual intravascular contrast from the chest CTA earlier today. Skull: Negative. Sinuses/Orbits: Improved paranasal sinus aeration throughout. Chronic left mastoidectomy. Stable mild right mastoid effusion.  Other: No acute orbit or scalp soft tissue findings. IMPRESSION: 1. No acute intracranial abnormality. 2. Completely resolved intraventricular hemorrhage since July. 3. Chronic right MCA infarct. Electronically Signed   By: Genevie Ann M.D.   On: 06/17/2018 21:02   Ct Angio Chest Pe W And/or Wo Contrast  Result Date: 06/17/2018 CLINICAL DATA:  Cough and abnormal breath sounds with hypoxia EXAM: CT ANGIOGRAPHY CHEST WITH CONTRAST TECHNIQUE: Multidetector CT imaging of the chest was performed using the standard protocol during bolus administration of intravenous contrast. Multiplanar CT image reconstructions and MIPs were obtained to  evaluate the vascular anatomy. CONTRAST:  70 mL [id number] COMPARISON:  04/03/2011 FINDINGS: Cardiovascular: Thoracic aorta demonstrates atherosclerotic calcifications as well as changes of prior coronary bypass grafting. Mild cardiac enlargement is seen. Coronary calcifications are noted. The aorta is incompletely opacified precluding adequate evaluation. The pulmonary artery shows a normal branching pattern bilaterally. The left pulmonary artery and its branches are within normal limits. On the right however there is a large clot within the right main pulmonary artery and extending into the upper and lower lobe branches. The RV LV ratio is greater than 1 consistent with a degree of right heart strain. Mediastinum/Nodes: Thoracic inlet is within normal limits. No significant hilar or mediastinal adenopathy is noted. The esophagus as visualized is within normal limits. Lungs/Pleura: Lungs are well aerated bilaterally with emphysematous changes. Small bilateral pleural effusions and dependent atelectatic changes are seen. No findings to suggest pulmonary infarct are noted at this time. Mild alveolar edema is noted particularly in the right upper lobe. No focal confluent infiltrate is seen. Upper Abdomen: Cholelithiasis is noted without complicating factors. No other focal abnormality is noted. Musculoskeletal: Degenerative changes of the thoracic spine are seen. Chronic changes at the costosternal junction on the right is seen. Some mild herniation of abdominal fat is noted inferiorly below the xiphoid stable in appearance from a prior exam from 2012. Review of the MIP images confirms the above findings. IMPRESSION: Positive for acute PE primarily within the right pulmonary arterial system with CT evidence of right heart strain (RV/LV Ratio is greater than 1) consistent with at least submassive (intermediate risk) PE. The presence of right heart strain has been associated with an increased risk of morbidity and  mortality. Please activate Code PE by paging [phone]. Mild alveolar edema is noted particularly in the right upper lobe. Small effusions and bibasilar dependent atelectatic changes. Critical Value/emergent results were called by telephone at the time of interpretation on 06/17/2018 at 6:24 pm to Dr. Lennice Sites , who verbally acknowledged these results. Aortic Atherosclerosis (ICD10-I70.0) and Emphysema (ICD10-J43.9). Electronically Signed   By: Inez Catalina M.D.   On: 06/17/2018 18:28   Dg Chest Port 1 View  Result Date: 07/03/2018 CLINICAL DATA:  Shortness of Breath EXAM: PORTABLE CHEST 1 VIEW COMPARISON:  June 28, 2018 FINDINGS: Endotracheal tube and nasogastric tube have been removed. Central catheter tip is in the superior vena cava. No pneumothorax. There is no evident edema or consolidation. Heart size and pulmonary vascularity are normal. Heart is upper normal in size with pulmonary vascularity normal. Patient is status post coronary artery bypass grafting. No bone lesions. Postoperative changes are noted in the right upper thoracic region. IMPRESSION: No edema or consolidation. Stable cardiac silhouette. Central catheter tip in superior vena cava. No pneumothorax. Electronically Signed   By: Lowella Grip III M.D.   On: 07/03/2018 22:16   Dg Chest Port 1 View  Result Date: 06/28/2018 CLINICAL DATA:  Acute respiratory failure. EXAM: PORTABLE CHEST 1 VIEW COMPARISON:  One-view chest x-ray 06/27/2018 FINDINGS: Heart is enlarged. Endotracheal tube terminates 3.8 cm above the carina. A right side PICC line is stable. Side port of the NG tube is just beyond the GE junction. Aeration of both lungs is slightly improved. Small effusions and bibasilar atelectasis remain. IMPRESSION: 1. Improving aeration of both lungs. 2. Residual small bilateral pleural effusions and associated atelectasis. 3. Support apparatus stable. Electronically Signed   By: San Morelle M.D.   On: 06/28/2018  10:06   Dg Chest Port 1 View  Result Date: 06/27/2018 CLINICAL DATA:  Acute respiratory failure. EXAM: PORTABLE CHEST 1 VIEW COMPARISON:  One-view chest x-ray 06/26/2018 FINDINGS: Endotracheal tube terminates 2.5 cm above the carina. NG tube courses off the inferior border the film. A right-sided PICC line is in place. Heart is enlarged. CABG is noted. Interstitial edema is slightly improved. Bilateral pleural effusions remain. Bibasilar airspace disease likely reflects atelectasis. IMPRESSION: 1. Support apparatus stable and in satisfactory position. 2. Slight decrease in interstitial edema. 3. Cardiomegaly with persistent bilateral effusions and basilar airspace disease, likely atelectasis. Findings are consistent with congestive heart failure. Electronically Signed   By: San Morelle M.D.   On: 06/27/2018 07:28   Dg Chest Port 1 View  Result Date: 06/26/2018 CLINICAL DATA:  Acute respiratory failure EXAM: PORTABLE CHEST 1 VIEW COMPARISON:  06/25/2018 FINDINGS: Cardiac shadow remains enlarged. Postsurgical changes are again seen. The endotracheal tube, right-sided PICC line and nasogastric catheter are again seen and stable. Bilateral pleural effusions are noted. Left retrocardiac consolidation is seen. IMPRESSION: Bilateral pleural effusions with left basilar consolidation. Tubes and lines as described. Electronically Signed   By: Inez Catalina M.D.   On: 06/26/2018 07:15   Dg Chest Port 1 View  Result Date: 06/25/2018 CLINICAL DATA:  Acute respiratory failure. EXAM: PORTABLE CHEST 1 VIEW COMPARISON:  06/24/2018 FINDINGS: Endotracheal tube is 2.0 cm above the carina. PICC line tip in the lower SVC region. Nasogastric tube extends into the abdomen. Persistent basilar chest densities are most compatible with pleural fluid and volume loss. Negative for a pneumothorax. Stable hazy densities in the medial right upper lung. Heart size is upper limits of normal but stable. IMPRESSION: 1. Stable  position of the support apparatuses. 2. No significant change in the lung disease. Persistent basilar chest densities are most compatible with pleural fluid and atelectasis/consolidation. Electronically Signed   By: Markus Daft M.D.   On: 06/25/2018 09:11   Dg Chest Port 1 View  Result Date: 06/24/2018 CLINICAL DATA:  Insertion of central venous catheter, history of atrial fibrillation and cardiomyopathy, prior CABG with eventual debridement for infection EXAM: PORTABLE CHEST 1 VIEW COMPARISON:  Chest x-ray of 06/24/2018 and CT chest of 06/17/2017 FINDINGS: The tip of the endotracheal tube is very near the carina, less than 1 cm above the carina and withdrawing by 2 cm is recommended. Right central venous line tip is seen to the mid low SVC. No pneumothorax is seen. Cardiomegaly and mild pulmonary vascular congestion is present with probable small effusions left-greater-than-right. NG tube extends below the hemidiaphragm. IMPRESSION: 1. Right central venous line tip seen to the lower SVC. No pneumothorax. 2. Cardiomegaly with mild CHF and small effusions, left greater than right. 3. Endotracheal tube tip very near the carina. Recommend retracting by approximately 2 cm. Electronically Signed   By: Ivar Drape M.D.   On: 06/24/2018 12:04   Dg Chest Port 1 View  Result Date:  06/24/2018 CLINICAL DATA:  Patient admitted 06/17/2018 with hypoxia and pulmonary embolus. Intubated. EXAM: PORTABLE CHEST 1 VIEW COMPARISON:  Single-view of the chest 06/23/2018 and 06/22/2018. FINDINGS: The patient's neck appears to be in flexion. Endotracheal tube is 0.8 cm above the carina. NG tube courses into the stomach and below the inferior margin the film. Left greater than right pleural effusions and basilar airspace disease appear similar to yesterday's exam. Heart size is enlarged. No pneumothorax. Surgical clips right axilla noted. IMPRESSION: No notable change in left greater than right pleural effusions and basilar airspace  disease, likely atelectasis. Endotracheal tube tip is 0.8 cm above the carina which may be secondary to the patient's neck being in flexion. Electronically Signed   By: Inge Rise M.D.   On: 06/24/2018 10:05   Portable Chest Xray  Result Date: 06/23/2018 CLINICAL DATA:  Shortness of breath. EXAM: PORTABLE CHEST 1 VIEW COMPARISON:  Radiograph of June 22, 2018. FINDINGS: Stable cardiomegaly. Endotracheal and nasogastric tubes are unchanged in position. Status post coronary artery bypass graft. Increased central pulmonary vascular congestion and bilateral pulmonary edema is noted. Mild to moderate left pleural effusion is noted with associated atelectasis. No pneumothorax is noted. Bony thorax is unremarkable. IMPRESSION: Increased central pulmonary vascular congestion and bilateral pulmonary edema, with mild to moderate left pleural effusion with associated atelectasis. Stable support apparatus. Electronically Signed   By: Marijo Conception, M.D.   On: 06/23/2018 08:42   Dg Chest Port 1 View  Result Date: 06/22/2018 CLINICAL DATA:  Endotracheal tube placement. EXAM: PORTABLE CHEST 1 VIEW COMPARISON:  Radiograph of same day. FINDINGS: Stable cardiomegaly. Status post coronary artery bypass graft. Endotracheal tube is projected over tracheal air shadow with distal tip 2 cm above the carina. Nasogastric tube is seen entering stomach. No pneumothorax is noted. Mild bibasilar subsegmental atelectasis is noted with probable small pleural effusions. Bony thorax is unremarkable. Right axillary surgical clips are noted. IMPRESSION: Endotracheal tube in grossly good position. Interval placement of nasogastric tube. Probable mild bibasilar subsegmental atelectasis with small pleural effusions. Electronically Signed   By: Marijo Conception, M.D.   On: 06/22/2018 12:14   Dg Chest Port 1 View  Result Date: 06/22/2018 CLINICAL DATA:  Shortness of breath. Aspiration pneumonia. Recent pulmonary emboli. EXAM:  PORTABLE CHEST 1 VIEW COMPARISON:  Chest x-rays dated 06/21/2018 and 06/17/2018 and chest CT dated 06/17/2018. FINDINGS: There is new slight hazy perihilar pulmonary vascular congestion bilaterally. Small bilateral effusions, less prominent on right. Overall heart size is normal. CABG. No acute bone abnormality. IMPRESSION: New slight pulmonary vascular congestion. Small bilateral effusions, diminished on the right. Electronically Signed   By: Lorriane Shire M.D.   On: 06/22/2018 10:23   Dg Chest Port 1 View  Result Date: 06/21/2018 CLINICAL DATA:  Patient with acute respiratory distress. EXAM: PORTABLE CHEST 1 VIEW COMPARISON:  Chest radiograph 06/17/2018 FINDINGS: Monitoring leads overlie the patient. Stable cardiomegaly. New moderate left pleural effusion with underlying opacities. Minimal right basilar heterogeneous opacities. IMPRESSION: Cardiomegaly. New moderate left effusion with underlying opacities which may represent atelectasis or infection. Heterogeneous opacities right lung base may represent atelectasis. Electronically Signed   By: Lovey Newcomer M.D.   On: 06/21/2018 20:33   Dg Chest Portable 1 View  Result Date: 06/17/2018 CLINICAL DATA:  Cough and rhonchi. EXAM: PORTABLE CHEST 1 VIEW COMPARISON:  05/05/2018 FINDINGS: Stable cardiomegaly with post CABG change. Nonaneurysmal slightly atherosclerotic aorta. No overt pulmonary edema, consolidation, effusion or pneumothorax. Mild chronic interstitial prominence redemonstrated of  the lungs. No acute osseous abnormality. IMPRESSION: Stable cardiomegaly with minimal aortic atherosclerosis and post CABG change. No active pulmonary disease. Electronically Signed   By: Ashley Royalty M.D.   On: 06/17/2018 16:01   Dg Swallowing Func-speech Pathology  Result Date: 06/29/2018 Objective Swallowing Evaluation: Type of Study: MBS-Modified Barium Swallow Study  Patient Details Name: AIYANNAH FAYAD MRN: [id number] Date of Birth: 04/19/1956 Today's Date:  06/29/2018 Time: SLP Start Time (ACUTE ONLY): 1211 -SLP Stop Time (ACUTE ONLY): 1232 SLP Time Calculation (min) (ACUTE ONLY): 21 min Past Medical History: Past Medical History: Diagnosis Date . Acute right MCA stroke ([place]) 11/07/10 . Atrial fibrillation ([place])   a. s/p TEE-DCCV 02/2104; b. Xarelto started . CAD (coronary artery disease)   a.  cath 09/2010: LAD stent patent, S-Int/dCFX ok, S-PDA ok, L-LAD atretic;  b. Lexiscan Myoview (02/2014):  no ischemia, EF 55%; c. 11/2014 Cath/PCI: LM nl, LAD 20pISR, LCX 80-29m OM1 nl, RI 70p, RCA 40-581mRPDA 95ost/95-9541mTCA only w/ reduction to 50p/20m30m0d, L->LAD atretic, VG->RI->OM nl, VG->PDA 100p. . Cardiomyopathy with EF 40% at TEE 02/17/14 (likely tachycardia mediated - Myoview 02/19/14 neg for ischemia with normal EF) 02/18/2014 . COPD (chronic obstructive pulmonary disease) (HCC)[place] Depression  . Eczema  . GERD (gastroesophageal reflux disease)  . Headache  . HLD (hyperlipidemia)  . Homelessness 11/12/2011 . 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 Hx of CABG  . Hx of transesophageal echocardiography (TEE) for monitoring 11/2010  TEE 11/2010: EF 60-65%, BAE, trivial atrial septal shunt;  right heart cath in 10/2010 with elevated R and L heart pressures and diuretic started . Hypertension  . Hypothyroidism  . Persistent atrial fibrillation 10/2014 . Pulmonary nodules   repeat CT due in 11/2011 . Sleep apnea   recent sleep study  in 04/2014 per chart review  shows no significant OSA Past Surgical History: Past Surgical History: Procedure Laterality Date . BLADDER SURGERY   . CARDIAC CATHETERIZATION  2012 . CARDIOVERSION N/A 02/17/2014  Procedure: CARDIOVERSION;  Surgeon: PaulFay Records;  [facility];  Service: Cardiovascular;  Laterality: N/A; . CARDIOVERSION N/A 09/20/2016  Procedure: CARDIOVERSION;  Surgeon: KataDorothy Spark;  [facility];   Laterality: N/A; . CHEST WALL RECONSTRUCTION   . CORONARY ARTERY BYPASS GRAFT   . debriment for infection in chest   . HERNIA REPAIR   . KNEE ARTHROSCOPY Bilateral  . LEFT AND RIGHT HEART CATHETERIZATION WITH CORONARY ANGIOGRAM N/A 11/10/2014  Procedure: LEFT AND RIGHT HEART CATHETERIZATION WITH CORONARY ANGIOGRAM;  Surgeon: DaviLeonie Man;  [facility];  Service: Cardiovascular;  Laterality: N/A; . Left mastoidectomy   . TEE WITHOUT CARDIOVERSION N/A 02/17/2014  Procedure: TRANSESOPHAGEAL ECHOCARDIOGRAM (TEE);  Surgeon: PaulFay Records;  [facility];  Service: Cardiovascular;  Laterality: N/A; HPI: 62 y28r old female hx CVA, COPD, GERD, who resides in SNF [facility] with L sided weakness. Now presenting with hypoxia and PE. Started on heparin infusion. No h/o dysphagia documented but pt reports a h/o needing thickened liquids. BSE completed this admission on 10/10 with slight cough/throat clear not directly indicative of decreased airway protection. Regular diet/thin liquids recommended; SLP f/u x1 and s/o. On 10/14 pt became progressively lethargic with tachypnea and hypoxia; she was transferred back to ICU and intubated 10/14; one-way extubation 10/20.  Subjective: pt  quicker to respond than she was this morning Assessment / Plan / Recommendation CHL IP CLINICAL IMPRESSIONS 06/29/2018 Clinical Impression Pt presents with a moderate oral and mild pharyngeal dysphagia. Her mentation is brighter than it was even this morning, with quicker responses and more prolonged utterances. She has oral holding of all textures, requiringMax faded to Mod verbal cueing to initiated posterior transit. She has piecemeal swallowing, consistently swallowing boluses in two parts, and needing consistent cueing to swallow both parts. Across the study, her timing did begin to improve with cueing. Her pharyngeal phase demonstrated good timing and and strength across consistencies, although she did have  a single episode of premature spillage into the airway while consuming thin liquids via straw. When she did aspirate, it was sensed, triggering a cough response with only a second or two of delay. Recommend starting with Dys 1 diet and thin liquids via cup. Prognosis is good for advancement with additional time post-extubation, clearing of mental status, and reintroduction of PO diet. SLP will f/u for tolerance and ready to advance as swallow becomes swifter. SLP Visit Diagnosis Dysphagia, oropharyngeal phase (R13.12) Attention and concentration deficit following -- Frontal lobe and executive function deficit following -- Impact on safety and function Mild aspiration risk   CHL IP TREATMENT RECOMMENDATION 06/29/2018 Treatment Recommendations Therapy as outlined in treatment plan below   Prognosis 06/29/2018 Prognosis for Safe Diet Advancement Good Barriers to Reach Goals -- Barriers/Prognosis Comment -- CHL IP DIET RECOMMENDATION 06/29/2018 SLP Diet Recommendations Dysphagia 1 (Puree) solids;Thin liquid Liquid Administration via Cup;No straw Medication Administration Whole meds with puree Compensations Minimize environmental distractions;Slow rate;Small sips/bites Postural Changes Seated upright at 90 degrees   CHL IP OTHER RECOMMENDATIONS 06/29/2018 Recommended Consults -- Oral Care Recommendations Oral care BID Other Recommendations --   CHL IP FOLLOW UP RECOMMENDATIONS 06/29/2018 Follow up Recommendations Skilled Nursing facility   [facility] IP FREQUENCY AND DURATION 06/29/2018 Speech Therapy Frequency (ACUTE ONLY) min 2x/week Treatment Duration 2 weeks      CHL IP ORAL PHASE 06/29/2018 Oral Phase Impaired Oral - Pudding Teaspoon -- Oral - Pudding Cup -- Oral - Honey Teaspoon -- Oral - Honey Cup -- Oral - Nectar Teaspoon -- Oral - Nectar Cup -- Oral - Nectar Straw -- Oral - Thin Teaspoon Holding of bolus;Piecemeal swallowing Oral - Thin Cup Holding of bolus;Piecemeal swallowing Oral - Thin Straw Holding of  bolus;Piecemeal swallowing;Premature spillage Oral - Puree Holding of bolus;Piecemeal swallowing Oral - Mech Soft Holding of bolus;Piecemeal swallowing Oral - Regular -- Oral - Multi-Consistency -- Oral - Pill Holding of bolus Oral Phase - Comment --  CHL IP PHARYNGEAL PHASE 06/29/2018 Pharyngeal Phase Impaired Pharyngeal- Pudding Teaspoon -- Pharyngeal -- Pharyngeal- Pudding Cup -- Pharyngeal -- Pharyngeal- Honey Teaspoon -- Pharyngeal -- Pharyngeal- Honey Cup -- Pharyngeal -- Pharyngeal- Nectar Teaspoon -- Pharyngeal -- Pharyngeal- Nectar Cup -- Pharyngeal -- Pharyngeal- Nectar Straw -- Pharyngeal -- Pharyngeal- Thin Teaspoon WFL Pharyngeal -- Pharyngeal- Thin Cup WFL Pharyngeal -- Pharyngeal- Thin Straw Penetration/Aspiration before swallow Pharyngeal Material enters airway, passes BELOW cords and not ejected out despite cough attempt by patient;Material enters airway, passes BELOW cords then ejected out Pharyngeal- Puree WFL Pharyngeal -- Pharyngeal- Mechanical Soft WFL Pharyngeal -- Pharyngeal- Regular -- Pharyngeal -- Pharyngeal- Multi-consistency -- Pharyngeal -- Pharyngeal- Pill WFL Pharyngeal -- Pharyngeal Comment --  CHL IP CERVICAL ESOPHAGEAL PHASE 06/29/2018 Cervical Esophageal Phase WFL Pudding Teaspoon -- Pudding Cup -- Honey Teaspoon -- Honey Cup -- Nectar Teaspoon -- Nectar Cup -- Nectar Straw -- Thin  Teaspoon -- Thin Cup -- Thin Straw -- Puree -- Mechanical Soft -- Regular -- Multi-consistency -- Pill -- Cervical Esophageal Comment -- Germain Osgood 06/29/2018, 1:24 PM Germain Osgood, M.A. CCC-SLP Acute Rehabilitation Services Pager [phone] Office [phone]              [place] Ekg Site Rite  Result Date: 06/23/2018 If [facility] image not attached, placement could not be confirmed due to current cardiac rhythm.    Phillips Climes M.D on 07/04/2018 at 11:24 AM  Between 7am to 7pm - Pager - [phone]  After 7pm go to [email or web address] - password [facility]  [facility] -   Office  [phone]

## 2018-07-04 NOTE — Progress Notes (Signed)
Pt assessment concerning at this time.  Breathing shallow, confusion, ashen color and leg mottling noted.  Pedal pulses positive bilaterally.  Hypotension (see flowsheet).  Noted crackles to upper airway.  Rapid response called for additional assessment and provider notified of findings.  Pt with no urine output since foley catheter placed and pt repeating that she "needs to pee."  Per report, there is concern that pressure from the retroperitoneal hematoma maybe occluding catheter outflow.  She presently has 1 more unit of blood to give of 2 that are ordered.  Completed first unit.  Orders for CXR received and IV lasix.  Slid pt up in bed, encouraged cough.  Started 2nd unit of prbc.  Will continue to monitor closely.  Charge nurse and rapid present.

## 2018-07-04 NOTE — Progress Notes (Signed)
CRITICAL VALUE ALERT  Critical Value:  Hemeglobin 6.6  Date & Time Notied:  10/26    1920  Provider Notified: Bruna Obie  Orders Received/Actions taken:

## 2018-07-05 DIAGNOSIS — Z7189 Other specified counseling: Secondary | ICD-10-CM

## 2018-07-05 DIAGNOSIS — R0603 Acute respiratory distress: Secondary | ICD-10-CM

## 2018-07-05 LAB — CBC
HCT: 20.3 % — ABNORMAL LOW (ref 36.0–46.0)
HCT: 29.7 % — ABNORMAL LOW (ref 36.0–46.0)
HEMOGLOBIN: 6.6 g/dL — AB (ref 12.0–15.0)
Hemoglobin: 10 g/dL — ABNORMAL LOW (ref 12.0–15.0)
MCH: 28.7 pg (ref 26.0–34.0)
MCH: 29.7 pg (ref 26.0–34.0)
MCHC: 32.5 g/dL (ref 30.0–36.0)
MCHC: 33.7 g/dL (ref 30.0–36.0)
MCV: 88.3 fL (ref 80.0–100.0)
MCV: 88.1 fL (ref 80.0–100.0)
NRBC: 0.1 % (ref 0.0–0.2)
Platelets: 227 10*3/uL (ref 150–400)
Platelets: 213 10*3/uL (ref 150–400)
RBC: 2.3 MIL/uL — ABNORMAL LOW (ref 3.87–5.11)
RBC: 3.37 MIL/uL — ABNORMAL LOW (ref 3.87–5.11)
RDW: 22.5 % — ABNORMAL HIGH (ref 11.5–15.5)
RDW: 18 % — ABNORMAL HIGH (ref 11.5–15.5)
WBC: 17 10*3/uL — ABNORMAL HIGH (ref 4.0–10.5)
WBC: 15.2 10*3/uL — AB (ref 4.0–10.5)
nRBC: 0 % (ref 0.0–0.2)

## 2018-07-05 LAB — BASIC METABOLIC PANEL
ANION GAP: 15 (ref 5–15)
BUN: 33 mg/dL — ABNORMAL HIGH (ref 6–20)
CALCIUM: 8.5 mg/dL — AB (ref 8.9–10.3)
CHLORIDE: 105 mmol/L (ref 98–111)
CO2: 22 mmol/L (ref 22–32)
CREATININE: 2.64 mg/dL — AB (ref 0.44–1.00)
GFR calc non Af Amer: 19 mL/min — ABNORMAL LOW (ref >60)
GFR, EST AFRICAN AMERICAN: 21 mL/min — AB (ref >60)
Glucose, Bld: 120 mg/dL — ABNORMAL HIGH (ref 70–99)
Potassium: 5.2 mmol/L — ABNORMAL HIGH (ref 3.5–5.1)
SODIUM: 142 mmol/L (ref 135–145)

## 2018-07-05 MED ORDER — FENTANYL CITRATE (PF) 100 MCG/2ML IJ SOLN
25.0000 ug | INTRAMUSCULAR | Status: DC | PRN
  Administered 2018-07-05 – 2018-07-06 (×3): 25 ug via INTRAVENOUS
  Filled 2018-07-05 (×3): qty 2

## 2018-07-05 MED ORDER — LORAZEPAM 1 MG PO TABS
1.0000 mg | ORAL_TABLET | ORAL | Status: DC | PRN
  Filled 2018-07-05: qty 1

## 2018-07-05 MED ORDER — LORAZEPAM 2 MG/ML IJ SOLN
1.0000 mg | INTRAMUSCULAR | Status: DC | PRN
  Administered 2018-07-06: 1 mg via INTRAVENOUS
  Filled 2018-07-05 (×2): qty 1

## 2018-07-05 MED ORDER — ONDANSETRON 4 MG PO TBDP: 4.0000 mg | ORAL_TABLET | Freq: Four times a day (QID) | ORAL | Status: DC | PRN

## 2018-07-05 MED ORDER — ACETAMINOPHEN 325 MG PO TABS: 650.0000 mg | ORAL_TABLET | Freq: Four times a day (QID) | ORAL | Status: DC | PRN

## 2018-07-05 MED ORDER — HALOPERIDOL 0.5 MG PO TABS: 0.5000 mg | ORAL_TABLET | ORAL | Status: DC | PRN

## 2018-07-05 MED ORDER — LORAZEPAM 2 MG/ML PO CONC: 1.0000 mg | ORAL | Status: DC | PRN

## 2018-07-05 MED ORDER — ACETAMINOPHEN 650 MG RE SUPP: 650.0000 mg | Freq: Four times a day (QID) | RECTAL | Status: DC | PRN

## 2018-07-05 MED ORDER — ONDANSETRON HCL 4 MG/2ML IJ SOLN: 4.0000 mg | Freq: Four times a day (QID) | INTRAMUSCULAR | Status: DC | PRN

## 2018-07-05 MED ORDER — HALOPERIDOL LACTATE 2 MG/ML PO CONC: 0.5000 mg | ORAL | Status: DC | PRN

## 2018-07-05 MED ORDER — HALOPERIDOL LACTATE 5 MG/ML IJ SOLN: 0.5000 mg | INTRAMUSCULAR | Status: DC | PRN

## 2018-07-05 NOTE — Progress Notes (Signed)
[facility]  Subjective  - Legs are more mottled today.  Oliguric ARF.  Worsening clinical decline.  Hgb dropped.     Objective (!) 133/92 73 97.7 F (36.5 C) (Oral) (!) 23 92%  Intake/Output Summary (Last 24 hours) at 07/05/2018 0946 Last data filed at 07/05/2018 0600 Gross per 24 hour  Intake 630 ml  Output 90 ml  Net 540 ml    General: Appears very ill Palpable femoral pulses R DP biphasic L PT monophasic   Laboratory Lab Results: Recent Labs    07/04/18 1719 07/05/18 0550  WBC 17.0* 15.2*  HGB 6.6* 10.0*  HCT 20.3* 29.7*  PLT 227 213   BMET Recent Labs    07/04/18 0545 07/05/18 0550  NA 140 142  K 4.7 5.2*  CL 104 105  CO2 23 22  GLUCOSE 182* 120*  BUN 25* 33*  CREATININE 2.01* 2.64*  CALCIUM 9.0 8.5*    COAG Lab Results  Component Value Date   INR 1.66 07/04/2018   INR 1.18 04/14/2018   INR 1.4 (H) 09/13/2016   No results found for: PTT  Assessment/Planning:  Discussed plan of care with son last night.  Family planning to proceed with comfort care given continued decline and overally very ill, which is very reasonable.  Vascular surgery will sign off.  Cephus Shelling 07/05/2018 9:46 AM --

## 2018-07-05 NOTE — Progress Notes (Signed)
PROGRESS NOTE                                                                                                                                                                                                             Patient Demographics:    [name], is a 62 y.o. female, DOB - November 15, 1955, LJQ:[id number]  Admit date - 06/17/2018   Admitting Physician Aldean Jewett, MD  Outpatient Primary MD for the patient is Panosh, Standley Brooking, MD  LOS - 18   Chief Complaint  Patient presents with  . Cough  . Shortness of Breath       Brief Narrative    62 year old female w/ a hx of tobacco abuse, dementia, sleep apnea, systolic CHF, HTN, Hypothyroidism, CVA, and COPD who resides in a SNF after a recent ICH/ IVH while on Xarelto for A. fib resulting in L sided weakness who presented with hypoxia and and was found to have PE.  Total course was complicated by respiratory failure required intubation, she was successfully extubated, had acute blood loss anemia where she required 5 units PRBC transfusion secondary to retro-/extraperitoneal hemorrhage and abdominal wall hematoma most likely in the setting of the anticoagulation, oliguric acute renal failure, patient clinically continued to deteriorate, with overall very poor prognosis, and has been made to transition to comfort care, please see discussion below  Significant Events: 10/9 > Admit Acute PE 10/10 >Speech Evaluation >Mild aspiration Risk >No aspiration Noted  10/11 >Transfer to [facility]  10/13: Increased tachycardia, tachypnea and respiratory distress rapid response called, more agitated, recurrent atrial fibrillation with rapid ventricular response. 10/14: Critical care reconsulted for respiratory distress, intubated 10/20: one way extubation 10/25: CT abdomen pelvis significant for large extraperitoneal/retroperitoneal/abdominal wall bleed. 10/26: IVC filter placement IR   Subjective:    [name] today and episodes of increased work of breathing overnight, complains of abdominal pain, as well she had hypotension overnight .   Assessment  & Plan :    Active Problems:   Atrial fibrillation with rapid ventricular response s/p TEE-DCCV (02/17/14)   Pulmonary embolism (HCC)   Pressure injury of skin   Acute respiratory failure with hypoxia ([place])  Acute pulmonary embolism - with increased RV/LV ratio - Acute Hypoxic resp failure  -off anticoagulation since ICH in July - lytic tx was not indicated -  Initially on Lovenox for anticoagulation, then she was transitioned to Eliquis, currently off all forms of anticoagulation secondary to large life-threatening retroperitoneal/extraperitoneal/abdominal wall hematoma . -IVC filter placed by IR 07/04/2018 prevent progression of her PE, in the setting of known left lower extremity DVT. -Patient is comfort care currently  Bilateral lower extremity ischemia -Lower extremity appears to be mottled today, with significantly diminished pulses, and clammy, evidence of gangrene. -This is most likely in the setting of perfusion status secondary to profound anemia, and hypotension. -Vascular surgery input greatly appreciated, recommendation were for nonoperative management, -Discomfort care currently.  Extraperitoneal/retroperitoneal abdominal bleed with abdominal wall hematoma -Is most likely in the setting of anticoagulation secondary to acute PE and left lower extremity DVT. -Currently anticoagulation on hold, continue with supportive transfusion, required another 2 units PRBC transfusion overnight, so far 4 units. -He is comfort care currently, no further labs  Oliguric acute renal failure -Most likely in the setting of ATN, secondary to hypotension overnight, appears to be oliguric, unfortunately renal function continues to deteriorate despite appropriate volume resuscitation with IV fluid boluses and blood transfusions, . -Urology input  appreciated, for abdominal bleed causing bladder compression, but no outlet obstruction  Pseudomonas PNA - Completed course of cefepime - afebrile w/ normal WBC   Metabolic encephalopathy - patient with baseline poor mental status secondary to IVH, complicated by long hospital stay, delirium.  Atrial fibrillation with RVR - A. fib with RVR hard to control, especially in the setting of hypotension, now blood pressure has improved, as well heart rate is improved after adding Toprol-XL, so for now continue with Cardizem and Toprol-XL . -Please see above discussion about anticoagulation . [name] Klebsiella urinary tract infection - tx completed   Anemia -Acute blood loss anemia in the setting of intra-abdominal bleed, continue with supportive care with transfusions, repeat CBC in the afternoon and transfuse if needed, she was transfused 2 units PRBC yesterday.   Constipation: Already on Colace.  Responded to MiraLAX  COPD without acute exacerbation Using Xopenex so as not to exacerbate RVR - no acute exacerbation   CAD with history of CABG  Hyperlipidemia  History of right MCA strokewith residual L sided weakness  IVHJuly 2019 resolved on CT head this admission  Pressure injuries left heel and left elbow WOC suggestions being followed   Goals of care -Unfortunately patient has staring events last July with her IVH, since then she continues to have rapid decline of her clinical condition and life quality with multiple hospitalizations since, currently she is having multiorgan failure, nonambulatory at baseline, with very poor prognosis, difficult amount of discomfort, I have discussed the son, plan is to proceed with comfort care measures today, I have stopped all nonessential medications, I have initiated palliative comfort care order set, will assess over the next 24 hours to determine disposition plan, palliative care consulted.    Code Status : DNR/comfort  care  Family Communication  : Discussed with son via phone today  Disposition Plan  : Pending further work up, remains in stepdown   Consults  :  PCCM, urology,vascular ,IR  Procedures  : None  DVT Prophylaxis  : Right lower extremity SCD, Lovenox stopped 07/03/2018  Lab Results  Component Value Date   PLT 213 07/05/2018    Antibiotics  :    Anti-infectives (From admission, onward)   Start     Dose/Rate Route Frequency Ordered Stop   06/25/18 2200  ceFEPIme (MAXIPIME) 2 g in sodium chloride 0.9 % 100 mL  IVPB     2 g 200 mL/hr over 30 Minutes Intravenous Every 12 hours 06/25/18 1012 06/28/18 2258   06/25/18 0000  vancomycin (VANCOCIN) IVPB 750 mg/150 ml premix  Status:  Discontinued     750 mg 150 mL/hr over 60 Minutes Intravenous Every 24 hours 06/24/18 1309 06/25/18 0926   06/24/18 2100  ceFEPIme (MAXIPIME) 1 g in sodium chloride 0.9 % 100 mL IVPB  Status:  Discontinued     1 g 200 mL/hr over 30 Minutes Intravenous Every 12 hours 06/24/18 1313 06/25/18 1012   06/24/18 1400  ceFEPIme (MAXIPIME) 2 g in sodium chloride 0.9 % 100 mL IVPB  Status:  Discontinued     2 g 200 mL/hr over 30 Minutes Intravenous Every 8 hours 06/24/18 1122 06/24/18 1313   06/22/18 2300  vancomycin (VANCOCIN) IVPB 750 mg/150 ml premix  Status:  Discontinued     750 mg 150 mL/hr over 60 Minutes Intravenous Every 12 hours 06/22/18 1049 06/24/18 1309   06/22/18 1200  metroNIDAZOLE (FLAGYL) IVPB 500 mg  Status:  Discontinued     500 mg 100 mL/hr over 60 Minutes Intravenous Every 8 hours 06/22/18 0815 06/24/18 1122   06/22/18 1100  vancomycin (VANCOCIN) 1,250 mg in sodium chloride 0.9 % 250 mL IVPB     1,250 mg 166.7 mL/hr over 90 Minutes Intravenous  Once 06/22/18 1030 06/22/18 1929   06/22/18 0600  metroNIDAZOLE (FLAGYL) IVPB 500 mg  Status:  Discontinued     500 mg 100 mL/hr over 60 Minutes Intravenous Every 8 hours 06/22/18 0543 06/22/18 0815   06/22/18 0545  ceFEPIme (MAXIPIME) 1 g in sodium  chloride 0.9 % 100 mL IVPB  Status:  Discontinued     1 g 200 mL/hr over 30 Minutes Intravenous Every 12 hours 06/22/18 0543 06/24/18 1122   06/19/18 0100  cefTRIAXone (ROCEPHIN) 1 g in sodium chloride 0.9 % 100 mL IVPB     1 g 200 mL/hr over 30 Minutes Intravenous Every 24 hours 06/18/18 1004 06/22/18 1928   06/17/18 2130  cefTRIAXone (ROCEPHIN) 2 g in sodium chloride 0.9 % 100 mL IVPB  Status:  Discontinued     2 g 200 mL/hr over 30 Minutes Intravenous Every 24 hours 06/17/18 2002 06/18/18 1004        Objective:   Vitals:   07/05/18 0045 07/05/18 0100 07/05/18 0435 07/05/18 0917  BP: 134/90 126/80 (!) 133/92   Pulse: 79 72 73   Resp: (!) 25 (!) 22 (!) 23   Temp: 97.6 F (36.4 C) (!) 97.4 F (36.3 C) 97.7 F (36.5 C)   TempSrc: Oral Oral Oral   SpO2: 92% 92%  92%  Weight:      Height:        Wt Readings from Last 3 Encounters:  07/04/18 62.9 kg  05/06/18 68 kg  04/27/18 68.2 kg     Intake/Output Summary (Last 24 hours) at 07/05/2018 1224 Last data filed at 07/05/2018 0600 Gross per 24 hour  Intake 630 ml  Output 90 ml  Net 540 ml     Physical Exam  Awake, confused, moaning, uncomfortable -Keep neck with diminished air entry at the bases, no wheezing Dominant tender in the suprapubic area, around hematoma site, bowel sounds present Regular rate and rhythm, no rubs or gallops Extremities with no edema, but they are extremely mottled, with diminished pulses.     Data Review:    CBC Recent Labs  Lab 07/01/18 0418 07/03/18 1631 07/04/18 0545  07/04/18 1719 07/05/18 0550  WBC 4.5 9.5 16.1* 17.0* 15.2*  HGB 8.8* 7.1* 8.4* 6.6* 10.0*  HCT 28.7* 22.3* 25.7* 20.3* 29.7*  PLT 127* 192 230 227 213  MCV 95.7 96.1 86.5 88.3 88.1  MCH 29.3 30.6 28.3 28.7 29.7  MCHC 30.7 31.8 32.7 32.5 33.7  RDW 18.3* 18.3* 22.1* 22.5* 18.0*    Chemistries  Recent Labs  Lab 06/30/18 0349 07/01/18 0418 07/03/18 1631 07/04/18 0545 07/05/18 0550  NA 137 138 139 140 142   K 3.2* 3.6 3.9 4.7 5.2*  CL 98 99 105 104 105  CO2 31 32 _0 GLUCOSE 97 86 164* 182* 120*  BUN _1 25* 33*  CREATININE 0.90 0.96 1.33* 2.01* 2.64*  CALCIUM 8.1* 8.4* 8.7* 9.0 8.5*  MG  --  1.4*  --  1.5*  --   AST  --  19  --   --   --   ALT  --  10  --   --   --   ALKPHOS  --  53  --   --   --   BILITOT  --  0.6  --   --   --    ------------------------------------------------------------------------------------------------------------------ No results for input(s): CHOL, HDL, LDLCALC, TRIG, CHOLHDL, LDLDIRECT in the last 72 hours.  Lab Results  Component Value Date   HGBA1C 5.1 03/21/2018   ------------------------------------------------------------------------------------------------------------------ No results for input(s): TSH, T4TOTAL, T3FREE, THYROIDAB in the last 72 hours.  Invalid input(s): FREET3 ------------------------------------------------------------------------------------------------------------------ No results for input(s): VITAMINB12, FOLATE, FERRITIN, TIBC, IRON, RETICCTPCT in the last 72 hours.  Coagulation profile Recent Labs  Lab 07/04/18 0545  INR 1.66    No results for input(s): DDIMER in the last 72 hours.  Cardiac Enzymes No results for input(s): CKMB, TROPONINI, MYOGLOBIN in the last 168 hours.  Invalid input(s): CK ------------------------------------------------------------------------------------------------------------------    Component Value Date/Time   BNP 206.5 (H) 06/18/2018 0308    Inpatient Medications  Scheduled Meds: . budesonide (PULMICORT) nebulizer solution  0.5 mg Nebulization BID  . chlorhexidine gluconate (MEDLINE KIT)  15 mL Mouth Rinse BID  . Chlorhexidine Gluconate Cloth  6 each Topical Daily  . docusate  100 mg Oral BID  . feeding supplement (ENSURE ENLIVE)  237 mL Oral BID BM  . furosemide  20 mg Intravenous Once  . mouth rinse  15 mL Mouth Rinse q12n4p  . povidone-iodine   Topical BID    Continuous Infusions:  PRN Meds:.acetaminophen **OR** acetaminophen, fentaNYL (SUBLIMAZE) injection, fentaNYL (SUBLIMAZE) injection, haloperidol **OR** haloperidol **OR** haloperidol lactate, levalbuterol, LORazepam **OR** LORazepam **OR** LORazepam, metoprolol tartrate, ondansetron **OR** ondansetron (ZOFRAN) IV, sodium chloride flush  Micro Results No results found for this or any previous visit (from the past 240 hour(s)).  Radiology Reports Ct Abdomen Pelvis Wo Contrast  Result Date: 07/03/2018 CLINICAL DATA:  Suprapubic mass and pain. EXAM: CT ABDOMEN AND PELVIS WITHOUT CONTRAST TECHNIQUE: Multidetector CT imaging of the abdomen and pelvis was performed following the standard protocol without IV contrast. COMPARISON:  CT scan from 2012. FINDINGS: Lower chest: The lung bases are grossly clear. Moderate breathing motion artifact but no definite infiltrates or effusions. Streaky bibasilar atelectasis. Diastasis of the chest wall possibly related to prior trauma or median sternotomy. Hepatobiliary: No focal hepatic lesions or intrahepatic biliary dilatation. Layering gallstones noted the gallbladder. No common bile duct dilatation. Pancreas: No mass, inflammation or ductal dilatation. Moderate pancreatic atrophy. Spleen: Normal size.  No focal lesions. Adrenals/Urinary Tract: Mildly thickened adrenal glands  bilaterally without discrete lesions. No renal calculi or mass. No ureteral calculi. The bladder is being compressed by a large left-sided extra peritoneal pelvic hematoma also involving the abdominal retroperitoneum. This measures approximately 11.3 x 11.7 x 8.5 cm. There is a Foley catheter in the bladder and small amount of air. Stomach/Bowel: Grossly normal. No inflammatory changes, mass lesions or obstructive findings. Small duodenal lipoma noted incidentally. Vascular/Lymphatic: Advanced atherosclerotic calcifications involving the aorta and iliac arteries and branch vessels. No mesenteric  or retroperitoneal mass or adenopathy. Reproductive: Grossly normal. Other: Large left-sided retroperitoneal and extraperitoneal pelvic hematoma as detailed above. There is also a small hematoma in the anterior abdominal wall on the right side. There is also a small left rectus muscle hematoma. Musculoskeletal: No significant bony findings. IMPRESSION: 1. Large left-sided extraperitoneal pelvic hematoma also in the lower abdominal retroperitoneum. Moderate mass effect on the bladder. 2. Small hematoma in the left rectus muscle and also in the right lower subcutaneous abdominal wall. 3. No intra-abdominal/intrapelvic mass or adenopathy. 4. Foley catheter in good position without complicating features. These results will be called to the ordering clinician or representative by the Radiologist Assistant, and communication documented in the PACS or zVision Dashboard. Electronically Signed   By: Marijo Sanes M.D.   On: 07/03/2018 15:38   Dg Abd 1 View - Kub  Result Date: 06/22/2018 CLINICAL DATA:  Orogastric tube placement. EXAM: ABDOMEN - 1 VIEW COMPARISON:  None. FINDINGS: The bowel gas pattern is normal. No radio-opaque calculi or other significant radiographic abnormality are seen. Distal tip of orogastric tube seen in expected position of proximal stomach. IMPRESSION: Distal tip of orogastric tube seen in expected position of proximal stomach. No evidence of bowel obstruction or ileus. Electronically Signed   By: Marijo Conception, M.D.   On: 06/22/2018 12:15   Ct Head Wo Contrast  Result Date: 06/21/2018 CLINICAL DATA:  Altered mental status. EXAM: CT HEAD WITHOUT CONTRAST TECHNIQUE: Contiguous axial images were obtained from the base of the skull through the vertex without intravenous contrast. COMPARISON:  CT head dated June 17, 2018. FINDINGS: Brain: No evidence of acute infarction, hemorrhage, hydrocephalus, extra-axial collection or mass lesion/mass effect. Unchanged chronic right MCA territory  encephalomalacia and ex vacuo dilatation of the right lateral ventricle. Vascular: Atherosclerotic vascular calcification of the carotid siphons. No hyperdense vessel. Skull: Negative. Sinuses/Orbits: No acute finding. Unchanged small right mastoid effusion. Prior left mastoidectomy. Other: None. IMPRESSION: 1.  No acute intracranial abnormality. 2. Chronic right MCA infarct. Electronically Signed   By: Titus Dubin M.D.   On: 06/21/2018 12:38   Ct Head Wo Contrast  Result Date: 06/17/2018 CLINICAL DATA:  62 year old female status post intraventricular hemorrhage in July. Now presenting with acute PE. Starting on heparin. EXAM: CT HEAD WITHOUT CONTRAST TECHNIQUE: Contiguous axial images were obtained from the base of the skull through the vertex without intravenous contrast. COMPARISON:  Head CTs 04/15/2018 and earlier. FINDINGS: Brain: Completely resolved intraventricular hemorrhage since July. Ex vacuo right lateral ventricle enlargement is related to the chronic right MCA territory encephalomalacia. No ventriculomegaly or transependymal edema. Stable gray-white matter differentiation throughout the brain. No midline shift, ventriculomegaly, mass effect, evidence of mass lesion, intracranial hemorrhage or evidence of cortically based acute infarction. Vascular: Mild Calcified atherosclerosis at the skull base. There is a degree of residual intravascular contrast from the chest CTA earlier today. Skull: Negative. Sinuses/Orbits: Improved paranasal sinus aeration throughout. Chronic left mastoidectomy. Stable mild right mastoid effusion. Other: No acute orbit or scalp soft  tissue findings. IMPRESSION: 1. No acute intracranial abnormality. 2. Completely resolved intraventricular hemorrhage since July. 3. Chronic right MCA infarct. Electronically Signed   By: Genevie Ann M.D.   On: 06/17/2018 21:02   Ct Angio Chest Pe W And/or Wo Contrast  Result Date: 06/17/2018 CLINICAL DATA:  Cough and abnormal breath sounds  with hypoxia EXAM: CT ANGIOGRAPHY CHEST WITH CONTRAST TECHNIQUE: Multidetector CT imaging of the chest was performed using the standard protocol during bolus administration of intravenous contrast. Multiplanar CT image reconstructions and MIPs were obtained to evaluate the vascular anatomy. CONTRAST:  70 mL [id number] COMPARISON:  04/03/2011 FINDINGS: Cardiovascular: Thoracic aorta demonstrates atherosclerotic calcifications as well as changes of prior coronary bypass grafting. Mild cardiac enlargement is seen. Coronary calcifications are noted. The aorta is incompletely opacified precluding adequate evaluation. The pulmonary artery shows a normal branching pattern bilaterally. The left pulmonary artery and its branches are within normal limits. On the right however there is a large clot within the right main pulmonary artery and extending into the upper and lower lobe branches. The RV LV ratio is greater than 1 consistent with a degree of right heart strain. Mediastinum/Nodes: Thoracic inlet is within normal limits. No significant hilar or mediastinal adenopathy is noted. The esophagus as visualized is within normal limits. Lungs/Pleura: Lungs are well aerated bilaterally with emphysematous changes. Small bilateral pleural effusions and dependent atelectatic changes are seen. No findings to suggest pulmonary infarct are noted at this time. Mild alveolar edema is noted particularly in the right upper lobe. No focal confluent infiltrate is seen. Upper Abdomen: Cholelithiasis is noted without complicating factors. No other focal abnormality is noted. Musculoskeletal: Degenerative changes of the thoracic spine are seen. Chronic changes at the costosternal junction on the right is seen. Some mild herniation of abdominal fat is noted inferiorly below the xiphoid stable in appearance from a prior exam from 2012. Review of the MIP images confirms the above findings. IMPRESSION: Positive for acute PE primarily within the  right pulmonary arterial system with CT evidence of right heart strain (RV/LV Ratio is greater than 1) consistent with at least submassive (intermediate risk) PE. The presence of right heart strain has been associated with an increased risk of morbidity and mortality. Please activate Code PE by paging [phone]. Mild alveolar edema is noted particularly in the right upper lobe. Small effusions and bibasilar dependent atelectatic changes. Critical Value/emergent results were called by telephone at the time of interpretation on 06/17/2018 at 6:24 pm to Dr. Lennice Sites , who verbally acknowledged these results. Aortic Atherosclerosis (ICD10-I70.0) and Emphysema (ICD10-J43.9). Electronically Signed   By: Inez Catalina M.D.   On: 06/17/2018 18:28   Ir Ivc Filter Plmt / S&i /img Guid/mod Sed  Result Date: 07/04/2018 INDICATION: DVT and pulmonary embolism. Patient recently started on anti coagulation however has subsequently developed a symptomatic retroperitoneal hematoma. As such, request made for placement of an IVC filter for the purposes of caval interruption. Given the patient's multiple medical comorbidities, the IVC filter will be considered a permanent device. EXAM: ULTRASOUND GUIDANCE FOR VASCULAR ACCESS IVC CATHETERIZATION AND VENOGRAM IVC FILTER INSERTION COMPARISON:  None. MEDICATIONS: None. ANESTHESIA/SEDATION: Fentanyl 25 mcg IV; Versed 0.5 mg IV Sedation Time: 16 minutes; The patient was continuously monitored during the procedure by the interventional radiology nurse under my direct supervision. CONTRAST:  None, CO2 was utilized for this examination. FLUOROSCOPY TIME:  1 minute, 30 seconds (9 mGy) COMPLICATIONS: None immediate PROCEDURE: Informed consent was obtained from the patient following explanation of  the procedure, risks, benefits and alternatives. The patient understands, agrees and consents for the procedure. All questions were addressed. A time out was performed prior to the initiation  of the procedure. Maximal barrier sterile technique utilized including caps, mask, sterile gowns, sterile gloves, large sterile drape, hand hygiene, and Betadine prep. Under sterile condition and local anesthesia, right internal jugular venous access was performed with ultrasound. An ultrasound image was saved and sent to PACS. Over a guidewire, the IVC filter delivery sheath and inner dilator were advanced into the IVC just above the IVC bifurcation. CO2 injection was performed for an IVC venogram. Through the delivery sheath, a retrievable Denali IVC filter was deployed below the level of the renal veins and above the IVC bifurcation. Limited post deployment venacavagram was performed. The delivery sheath was removed and hemostasis was obtained with manual compression. A dressing was placed. The patient tolerated the procedure well without immediate post procedural complication. FINDINGS: The IVC is patent. CO2 venogram is limited secondary to patient's inability to cooperate with the imaging however there is no definitive evidence of thrombus, stenosis, or occlusion. No variant venous anatomy. Successful placement of the IVC filter below the level of the renal veins. Above also supported by imaging findings demonstrated on preceding noncontrast abdominal CT. IMPRESSION: Successful ultrasound and fluoroscopically guided placement of an infrarenal retrievable IVC filter via right jugular approach. PLAN: Due to patient related comorbidities and/or clinical necessity, this IVC filter should be considered a permanent device. This patient will not be actively followed for future filter retrieval. Electronically Signed   By: Sandi Mariscal M.D.   On: 07/04/2018 15:48   Dg Chest Port 1 View  Result Date: 07/03/2018 CLINICAL DATA:  Shortness of Breath EXAM: PORTABLE CHEST 1 VIEW COMPARISON:  June 28, 2018 FINDINGS: Endotracheal tube and nasogastric tube have been removed. Central catheter tip is in the superior vena  cava. No pneumothorax. There is no evident edema or consolidation. Heart size and pulmonary vascularity are normal. Heart is upper normal in size with pulmonary vascularity normal. Patient is status post coronary artery bypass grafting. No bone lesions. Postoperative changes are noted in the right upper thoracic region. IMPRESSION: No edema or consolidation. Stable cardiac silhouette. Central catheter tip in superior vena cava. No pneumothorax. Electronically Signed   By: Lowella Grip III M.D.   On: 07/03/2018 22:16   Dg Chest Port 1 View  Result Date: 06/28/2018 CLINICAL DATA:  Acute respiratory failure. EXAM: PORTABLE CHEST 1 VIEW COMPARISON:  One-view chest x-ray 06/27/2018 FINDINGS: Heart is enlarged. Endotracheal tube terminates 3.8 cm above the carina. A right side PICC line is stable. Side port of the NG tube is just beyond the GE junction. Aeration of both lungs is slightly improved. Small effusions and bibasilar atelectasis remain. IMPRESSION: 1. Improving aeration of both lungs. 2. Residual small bilateral pleural effusions and associated atelectasis. 3. Support apparatus stable. Electronically Signed   By: San Morelle M.D.   On: 06/28/2018 10:06   Dg Chest Port 1 View  Result Date: 06/27/2018 CLINICAL DATA:  Acute respiratory failure. EXAM: PORTABLE CHEST 1 VIEW COMPARISON:  One-view chest x-ray 06/26/2018 FINDINGS: Endotracheal tube terminates 2.5 cm above the carina. NG tube courses off the inferior border the film. A right-sided PICC line is in place. Heart is enlarged. CABG is noted. Interstitial edema is slightly improved. Bilateral pleural effusions remain. Bibasilar airspace disease likely reflects atelectasis. IMPRESSION: 1. Support apparatus stable and in satisfactory position. 2. Slight decrease in interstitial edema. 3.  Cardiomegaly with persistent bilateral effusions and basilar airspace disease, likely atelectasis. Findings are consistent with congestive heart failure.  Electronically Signed   By: San Morelle M.D.   On: 06/27/2018 07:28   Dg Chest Port 1 View  Result Date: 06/26/2018 CLINICAL DATA:  Acute respiratory failure EXAM: PORTABLE CHEST 1 VIEW COMPARISON:  06/25/2018 FINDINGS: Cardiac shadow remains enlarged. Postsurgical changes are again seen. The endotracheal tube, right-sided PICC line and nasogastric catheter are again seen and stable. Bilateral pleural effusions are noted. Left retrocardiac consolidation is seen. IMPRESSION: Bilateral pleural effusions with left basilar consolidation. Tubes and lines as described. Electronically Signed   By: Inez Catalina M.D.   On: 06/26/2018 07:15   Dg Chest Port 1 View  Result Date: 06/25/2018 CLINICAL DATA:  Acute respiratory failure. EXAM: PORTABLE CHEST 1 VIEW COMPARISON:  06/24/2018 FINDINGS: Endotracheal tube is 2.0 cm above the carina. PICC line tip in the lower SVC region. Nasogastric tube extends into the abdomen. Persistent basilar chest densities are most compatible with pleural fluid and volume loss. Negative for a pneumothorax. Stable hazy densities in the medial right upper lung. Heart size is upper limits of normal but stable. IMPRESSION: 1. Stable position of the support apparatuses. 2. No significant change in the lung disease. Persistent basilar chest densities are most compatible with pleural fluid and atelectasis/consolidation. Electronically Signed   By: Markus Daft M.D.   On: 06/25/2018 09:11   Dg Chest Port 1 View  Result Date: 06/24/2018 CLINICAL DATA:  Insertion of central venous catheter, history of atrial fibrillation and cardiomyopathy, prior CABG with eventual debridement for infection EXAM: PORTABLE CHEST 1 VIEW COMPARISON:  Chest x-ray of 06/24/2018 and CT chest of 06/17/2017 FINDINGS: The tip of the endotracheal tube is very near the carina, less than 1 cm above the carina and withdrawing by 2 cm is recommended. Right central venous line tip is seen to the mid low SVC. No  pneumothorax is seen. Cardiomegaly and mild pulmonary vascular congestion is present with probable small effusions left-greater-than-right. NG tube extends below the hemidiaphragm. IMPRESSION: 1. Right central venous line tip seen to the lower SVC. No pneumothorax. 2. Cardiomegaly with mild CHF and small effusions, left greater than right. 3. Endotracheal tube tip very near the carina. Recommend retracting by approximately 2 cm. Electronically Signed   By: Ivar Drape M.D.   On: 06/24/2018 12:04   Dg Chest Port 1 View  Result Date: 06/24/2018 CLINICAL DATA:  Patient admitted 06/17/2018 with hypoxia and pulmonary embolus. Intubated. EXAM: PORTABLE CHEST 1 VIEW COMPARISON:  Single-view of the chest 06/23/2018 and 06/22/2018. FINDINGS: The patient's neck appears to be in flexion. Endotracheal tube is 0.8 cm above the carina. NG tube courses into the stomach and below the inferior margin the film. Left greater than right pleural effusions and basilar airspace disease appear similar to yesterday's exam. Heart size is enlarged. No pneumothorax. Surgical clips right axilla noted. IMPRESSION: No notable change in left greater than right pleural effusions and basilar airspace disease, likely atelectasis. Endotracheal tube tip is 0.8 cm above the carina which may be secondary to the patient's neck being in flexion. Electronically Signed   By: Inge Rise M.D.   On: 06/24/2018 10:05   Portable Chest Xray  Result Date: 06/23/2018 CLINICAL DATA:  Shortness of breath. EXAM: PORTABLE CHEST 1 VIEW COMPARISON:  Radiograph of June 22, 2018. FINDINGS: Stable cardiomegaly. Endotracheal and nasogastric tubes are unchanged in position. Status post coronary artery bypass graft. Increased central pulmonary vascular  congestion and bilateral pulmonary edema is noted. Mild to moderate left pleural effusion is noted with associated atelectasis. No pneumothorax is noted. Bony thorax is unremarkable. IMPRESSION: Increased  central pulmonary vascular congestion and bilateral pulmonary edema, with mild to moderate left pleural effusion with associated atelectasis. Stable support apparatus. Electronically Signed   By: Marijo Conception, M.D.   On: 06/23/2018 08:42   Dg Chest Port 1 View  Result Date: 06/22/2018 CLINICAL DATA:  Endotracheal tube placement. EXAM: PORTABLE CHEST 1 VIEW COMPARISON:  Radiograph of same day. FINDINGS: Stable cardiomegaly. Status post coronary artery bypass graft. Endotracheal tube is projected over tracheal air shadow with distal tip 2 cm above the carina. Nasogastric tube is seen entering stomach. No pneumothorax is noted. Mild bibasilar subsegmental atelectasis is noted with probable small pleural effusions. Bony thorax is unremarkable. Right axillary surgical clips are noted. IMPRESSION: Endotracheal tube in grossly good position. Interval placement of nasogastric tube. Probable mild bibasilar subsegmental atelectasis with small pleural effusions. Electronically Signed   By: Marijo Conception, M.D.   On: 06/22/2018 12:14   Dg Chest Port 1 View  Result Date: 06/22/2018 CLINICAL DATA:  Shortness of breath. Aspiration pneumonia. Recent pulmonary emboli. EXAM: PORTABLE CHEST 1 VIEW COMPARISON:  Chest x-rays dated 06/21/2018 and 06/17/2018 and chest CT dated 06/17/2018. FINDINGS: There is new slight hazy perihilar pulmonary vascular congestion bilaterally. Small bilateral effusions, less prominent on right. Overall heart size is normal. CABG. No acute bone abnormality. IMPRESSION: New slight pulmonary vascular congestion. Small bilateral effusions, diminished on the right. Electronically Signed   By: Lorriane Shire M.D.   On: 06/22/2018 10:23   Dg Chest Port 1 View  Result Date: 06/21/2018 CLINICAL DATA:  Patient with acute respiratory distress. EXAM: PORTABLE CHEST 1 VIEW COMPARISON:  Chest radiograph 06/17/2018 FINDINGS: Monitoring leads overlie the patient. Stable cardiomegaly. New moderate left  pleural effusion with underlying opacities. Minimal right basilar heterogeneous opacities. IMPRESSION: Cardiomegaly. New moderate left effusion with underlying opacities which may represent atelectasis or infection. Heterogeneous opacities right lung base may represent atelectasis. Electronically Signed   By: Lovey Newcomer M.D.   On: 06/21/2018 20:33   Dg Chest Portable 1 View  Result Date: 06/17/2018 CLINICAL DATA:  Cough and rhonchi. EXAM: PORTABLE CHEST 1 VIEW COMPARISON:  05/05/2018 FINDINGS: Stable cardiomegaly with post CABG change. Nonaneurysmal slightly atherosclerotic aorta. No overt pulmonary edema, consolidation, effusion or pneumothorax. Mild chronic interstitial prominence redemonstrated of the lungs. No acute osseous abnormality. IMPRESSION: Stable cardiomegaly with minimal aortic atherosclerosis and post CABG change. No active pulmonary disease. Electronically Signed   By: Ashley Royalty M.D.   On: 06/17/2018 16:01   Dg Swallowing Func-speech Pathology  Result Date: 06/29/2018 Objective Swallowing Evaluation: Type of Study: MBS-Modified Barium Swallow Study  Patient Details Name: MERCER STALLWORTH MRN: [id number] Date of Birth: 07-28-1956 Today's Date: 06/29/2018 Time: SLP Start Time (ACUTE ONLY): 1211 -SLP Stop Time (ACUTE ONLY): 1232 SLP Time Calculation (min) (ACUTE ONLY): 21 min Past Medical History: Past Medical History: Diagnosis Date . Acute right MCA stroke ([place]) 11/07/10 . Atrial fibrillation ([place])   a. s/p TEE-DCCV 02/2104; b. Xarelto started . CAD (coronary artery disease)   a.  cath 09/2010: LAD stent patent, S-Int/dCFX ok, S-PDA ok, L-LAD atretic;  b. Lexiscan Myoview (02/2014):  no ischemia, EF 55%; c. 11/2014 Cath/PCI: LM nl, LAD 20pISR, LCX 80-74m OM1 nl, RI 70p, RCA 40-536mRPDA 95ost/95-9533mTCA only w/ reduction to 50p/52m39m0d, L->LAD atretic, VG->RI->OM nl, VG->PDA 100p. . Cardiomyopathy  with EF 40% at TEE 02/17/14 (likely tachycardia mediated - Myoview 02/19/14 neg for ischemia with  normal EF) 02/18/2014 . COPD (chronic obstructive pulmonary disease) ([place])  . Depression  . Eczema  . GERD (gastroesophageal reflux disease)  . Headache  . HLD (hyperlipidemia)  . Homelessness 11/12/2011 . 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 Hx MRSA infection   Chest wall syndrome post CABG . Hx of CABG  . Hx of transesophageal echocardiography (TEE) for monitoring 11/2010  TEE 11/2010: EF 60-65%, BAE, trivial atrial septal shunt;  right heart cath in 10/2010 with elevated R and L heart pressures and diuretic started . Hypertension  . Hypothyroidism  . Persistent atrial fibrillation 10/2014 . Pulmonary nodules   repeat CT due in 11/2011 . Sleep apnea   recent sleep study  in 04/2014 per chart review  shows no significant OSA Past Surgical History: Past Surgical History: Procedure Laterality Date . BLADDER SURGERY   . CARDIAC CATHETERIZATION  2012 . CARDIOVERSION N/A 02/17/2014  Procedure: CARDIOVERSION;  Surgeon: Fay Records, MD;  Location[facility] ENDOSCOPY;  Service: Cardiovascular;  Laterality: N/A; . CARDIOVERSION N/A 09/20/2016  Procedure: CARDIOVERSION;  Surgeon: Dorothy Spark, MD;  Location: [place];  Service: Cardiovascular;  Laterality: N/A; . CHEST WALL RECONSTRUCTION   . CORONARY ARTERY BYPASS GRAFT   . debriment for infection in chest   . HERNIA REPAIR   . KNEE ARTHROSCOPY Bilateral  . LEFT AND RIGHT HEART CATHETERIZATION WITH CORONARY ANGIOGRAM N/A 11/10/2014  Procedure: LEFT AND RIGHT HEART CATHETERIZATION WITH CORONARY ANGIOGRAM;  Surgeon: Leonie Man, MD;  Location: [facility] CATH LAB;  Service: Cardiovascular;  Laterality: N/A; . Left mastoidectomy   . TEE WITHOUT CARDIOVERSION N/A 02/17/2014  Procedure: TRANSESOPHAGEAL ECHOCARDIOGRAM (TEE);  Surgeon: Fay Records, MD;  Location: [facility] ENDOSCOPY;  Service: Cardiovascular;  Laterality: N/A; HPI: 62 year old female hx CVA, COPD, GERD, who resides in SNF after recent [place] / IVH with L sided weakness. Now presenting with hypoxia and  PE. Started on heparin infusion. No h/o dysphagia documented but pt reports a h/o needing thickened liquids. BSE completed this admission on 10/10 with slight cough/throat clear not directly indicative of decreased airway protection. Regular diet/thin liquids recommended; SLP f/u x1 and s/o. On 10/14 pt became progressively lethargic with tachypnea and hypoxia; she was transferred back to ICU and intubated 10/14; one-way extubation 10/20.  Subjective: pt quicker to respond than she was this morning Assessment / Plan / Recommendation CHL IP CLINICAL IMPRESSIONS 06/29/2018 Clinical Impression Pt presents with a moderate oral and mild pharyngeal dysphagia. Her mentation is brighter than it was even this morning, with quicker responses and more prolonged utterances. She has oral holding of all textures, requiringMax faded to Mod verbal cueing to initiated posterior transit. She has piecemeal swallowing, consistently swallowing boluses in two parts, and needing consistent cueing to swallow both parts. Across the study, her timing did begin to improve with cueing. Her pharyngeal phase demonstrated good timing and and strength across consistencies, although she did have a single episode of premature spillage into the airway while consuming thin liquids via straw. When she did aspirate, it was sensed, triggering a cough response with only a second or two of delay. Recommend starting with Dys 1 diet and thin liquids via cup. Prognosis is good for advancement with additional time post-extubation, clearing of mental status, and reintroduction of PO diet. SLP will f/u for tolerance and ready to advance as swallow  becomes swifter. SLP Visit Diagnosis Dysphagia, oropharyngeal phase (R13.12) Attention and concentration deficit following -- Frontal lobe and executive function deficit following -- Impact on safety and function Mild aspiration risk   CHL IP TREATMENT RECOMMENDATION 06/29/2018 Treatment Recommendations Therapy as  outlined in treatment plan below   Prognosis 06/29/2018 Prognosis for Safe Diet Advancement Good Barriers to Reach Goals -- Barriers/Prognosis Comment -- CHL IP DIET RECOMMENDATION 06/29/2018 SLP Diet Recommendations Dysphagia 1 (Puree) solids;Thin liquid Liquid Administration via Cup;No straw Medication Administration Whole meds with puree Compensations Minimize environmental distractions;Slow rate;Small sips/bites Postural Changes Seated upright at 90 degrees   CHL IP OTHER RECOMMENDATIONS 06/29/2018 Recommended Consults -- Oral Care Recommendations Oral care BID Other Recommendations --   CHL IP FOLLOW UP RECOMMENDATIONS 06/29/2018 Follow up Recommendations Skilled Nursing facility   [facility] IP FREQUENCY AND DURATION 06/29/2018 Speech Therapy Frequency (ACUTE ONLY) min 2x/week Treatment Duration 2 weeks      CHL IP ORAL PHASE 06/29/2018 Oral Phase Impaired Oral - Pudding Teaspoon -- Oral - Pudding Cup -- Oral - Honey Teaspoon -- Oral - Honey Cup -- Oral - Nectar Teaspoon -- Oral - Nectar Cup -- Oral - Nectar Straw -- Oral - Thin Teaspoon Holding of bolus;Piecemeal swallowing Oral - Thin Cup Holding of bolus;Piecemeal swallowing Oral - Thin Straw Holding of bolus;Piecemeal swallowing;Premature spillage Oral - Puree Holding of bolus;Piecemeal swallowing Oral - Mech Soft Holding of bolus;Piecemeal swallowing Oral - Regular -- Oral - Multi-Consistency -- Oral - Pill Holding of bolus Oral Phase - Comment --  CHL IP PHARYNGEAL PHASE 06/29/2018 Pharyngeal Phase Impaired Pharyngeal- Pudding Teaspoon -- Pharyngeal -- Pharyngeal- Pudding Cup -- Pharyngeal -- Pharyngeal- Honey Teaspoon -- Pharyngeal -- Pharyngeal- Honey Cup -- Pharyngeal -- Pharyngeal- Nectar Teaspoon -- Pharyngeal -- Pharyngeal- Nectar Cup -- Pharyngeal -- Pharyngeal- Nectar Straw -- Pharyngeal -- Pharyngeal- Thin Teaspoon WFL Pharyngeal -- Pharyngeal- Thin Cup WFL Pharyngeal -- Pharyngeal- Thin Straw Penetration/Aspiration before swallow Pharyngeal Material  enters airway, passes BELOW cords and not ejected out despite cough attempt by patient;Material enters airway, passes BELOW cords then ejected out Pharyngeal- Puree WFL Pharyngeal -- Pharyngeal- Mechanical Soft WFL Pharyngeal -- Pharyngeal- Regular -- Pharyngeal -- Pharyngeal- Multi-consistency -- Pharyngeal -- Pharyngeal- Pill WFL Pharyngeal -- Pharyngeal Comment --  CHL IP CERVICAL ESOPHAGEAL PHASE 06/29/2018 Cervical Esophageal Phase WFL Pudding Teaspoon -- Pudding Cup -- Honey Teaspoon -- Honey Cup -- Nectar Teaspoon -- Nectar Cup -- Nectar Straw -- Thin Teaspoon -- Thin Cup -- Thin Straw -- Puree -- Mechanical Soft -- Regular -- Multi-consistency -- Pill -- Cervical Esophageal Comment -- Germain Osgood 06/29/2018, 1:24 PM Germain Osgood, M.A. CCC-SLP Acute Rehabilitation Services Pager [phone] Office [phone]              [place] Ekg Site Rite  Result Date: 06/23/2018 If [facility] image not attached, placement could not be confirmed due to current cardiac rhythm.    Phillips Climes M.D on 07/05/2018 at 12:24 PM  Between 7am to 7pm - Pager - [phone]  After 7pm go to [email or web address] - password [facility]  [facility] -  Office  [phone]

## 2018-07-05 NOTE — Progress Notes (Signed)
Patient having continuous decline, worsening oliguric acute renal failure,more mottled today, more altered, confused and lethargic, I have discussed with son, she has very poor prognosis, with very poor life quality, after discussion with the son vision has been made to continue with comfort measures only. Tina Bienenstock MD

## 2018-07-06 ENCOUNTER — Other Ambulatory Visit: Payer: Self-pay | Admitting: Licensed Clinical Social Worker

## 2018-07-06 DIAGNOSIS — N179 Acute kidney failure, unspecified: Secondary | ICD-10-CM

## 2018-07-06 DIAGNOSIS — Z515 Encounter for palliative care: Secondary | ICD-10-CM

## 2018-07-06 DIAGNOSIS — D649 Anemia, unspecified: Secondary | ICD-10-CM

## 2018-07-06 LAB — BPAM RBC
BLOOD PRODUCT EXPIRATION DATE: 201911212359
BLOOD PRODUCT EXPIRATION DATE: 201911262359
Blood Product Expiration Date: 201911252359
Blood Product Expiration Date: 201911262359
ISSUE DATE / TIME: 201910252026
ISSUE DATE / TIME: 201910270033
ISSUE DATE / TIME: 201910251733
ISSUE DATE / TIME: 201910262041
UNIT TYPE AND RH: 5100
UNIT TYPE AND RH: 5100
Unit Type and Rh: 5100
Unit Type and Rh: 5100

## 2018-07-06 LAB — TYPE AND SCREEN
ABO/RH(D): O POS
ANTIBODY SCREEN: NEGATIVE
UNIT DIVISION: 0
Unit division: 0
Unit division: 0
Unit division: 0

## 2018-07-06 MED ORDER — FENTANYL CITRATE (PF) 100 MCG/2ML IJ SOLN
25.0000 ug | INTRAMUSCULAR | Status: DC | PRN
  Administered 2018-07-06 – 2018-07-07 (×5): 50 ug via INTRAVENOUS
  Filled 2018-07-06 (×5): qty 2

## 2018-07-06 MED ORDER — GLYCOPYRROLATE 0.2 MG/ML IJ SOLN
0.2000 mg | INTRAMUSCULAR | Status: DC | PRN
  Administered 2018-07-06 – 2018-07-07 (×3): 0.2 mg via INTRAVENOUS
  Filled 2018-07-06 (×3): qty 1

## 2018-07-06 NOTE — Progress Notes (Signed)
PMT NP met with son Mitzi Hansen) and niece Almyra Free) in family waiting room. Discussed events leading up to hospitalization and course of hospitalization including diagnoses, interventions, and poor prognosis. Son shares his mother's wishes against heroic measures/life-prolonging measures including resuscitation, life support, or feeding tube. He confirms his decision for comfort measures only, understanding poor prognosis. Son agreeable with transfer to hospice facility when bed available and if she remains stable for transfer. SW consulted. Symptom management medications have been ordered.   Full note to follow.    NO CHARGE  Ihor Dow, FNP-C Palliative Medicine Team  Phone: [phone] Fax: [phone]

## 2018-07-06 NOTE — Progress Notes (Signed)
  Speech Language Pathology Treatment: Dysphagia  Patient Details Name: [name] MRN: [id number] DOB: October 12, 1955 Today's Date: 07/06/2018 Time: 0900-0910 SLP Time Calculation (min) (ACUTE ONLY): 10 min  Assessment / Plan / Recommendation Clinical Impression  Pt seen with am meal. She is awake, but does not respond to social interaction, does not express any interest in PO, does not attend to PO. She is dramatically different from the last time I personally saw her, which has been several weeks. SLP offered max verbal and tactile cues for pt to attend to cup to lips, liquid to lips. Intake almost entirely passive with anterior spillage, grimacing and poor bolus control with suspected spill to pharynx and probable aspiration. Pts mentation is poor and motivation to eat and drink appears absent, risk of aspiration high. Given plan for comfort measures, will not make changes to diet so that pt can have access to PO if she desires it, but further SLP intervention at this point appears futile. Will sign off.   HPI HPI: 61 year old female hx CVA, COPD, GERD, who resides in SNF after recent I[facility] / IVH with L sided weakness. Now presenting with hypoxia and PE. Started on heparin infusion. No h/o dysphagia documented but pt reports a h/o needing thickened liquids. BSE completed this admission on 10/10 with slight cough/throat clear not directly indicative of decreased airway protection. Regular diet/thin liquids recommended; SLP f/u x1 and s/o. On 10/14 pt became progressively lethargic with tachypnea and hypoxia; she was transferred back to ICU and intubated 10/14; one-way extubation 10/20.      SLP Plan  Discharge SLP treatment due to (comment)       Recommendations  Diet recommendations: Thin liquid Medication Administration: Crushed with puree Supervision: Staff to assist with self feeding;Full supervision/cueing for compensatory strategies Compensations: Minimize environmental  distractions;Slow rate;Small sips/bites Postural Changes and/or Swallow Maneuvers: Seated upright 90 degrees;Upright 30-60 min after meal                Follow up Recommendations: None Plan: Discharge SLP treatment due to (comment)       GO               Harlon Ditty, MA CCC-SLP  Acute Rehabilitation Services Pager [phone] Office [phone]  [name] 07/06/2018, 9:19 AM

## 2018-07-06 NOTE — Progress Notes (Signed)
PT Cancellation Note  Patient Details Name: [name] MRN: [id number] DOB: [birth year]   Cancelled Treatment:    Reason Eval/Treat Not Completed: Other (comment); patient comfort and PT order discontinued.  Will sign off.   [name] 07/06/2018, 12:46 PM  [name], PT Acute Rehabilitation Services [phone] 07/06/2018

## 2018-07-06 NOTE — Progress Notes (Signed)
PMT consult received and chart reviewed. Palliative NP to meet with son, [name] this afternoon at 3:30pm. Thank you.  NO CHARGE  Vennie Homans, FNP-C Palliative Medicine Team  Phone: [phone] Fax: [phone]

## 2018-07-06 NOTE — Progress Notes (Signed)
PROGRESS NOTE                                                                                                                                                                                                             Patient Demographics:    [name], is a 62 y.o. female, DOB - [birth year], HVF:[id number]  Admit date - 06/17/2018   Admitting Physician Aldean Jewett, MD  Outpatient Primary MD for the patient is Panosh, Standley Brooking, MD  LOS - 92   Chief Complaint  Patient presents with  . Cough  . Shortness of Breath       Brief Narrative    62 year old female w/ a hx of tobacco abuse, dementia, sleep apnea, systolic CHF, HTN, Hypothyroidism, CVA, and COPD who resides in a SNF after a recent ICH/ IVH while on Xarelto for A. fib resulting in L sided weakness who presented with hypoxia and and was found to have PE.  Total course was complicated by respiratory failure required intubation, she was successfully extubated, had acute blood loss anemia where she required 5 units PRBC transfusion secondary to retro-/extraperitoneal hemorrhage and abdominal wall hematoma most likely in the setting of the anticoagulation, oliguric acute renal failure, patient clinically continued to deteriorate, with overall very poor prognosis, and has been made to transition to comfort care, please see discussion below  Significant Events: 10/9 > Admit Acute PE 10/10 >Speech Evaluation >Mild aspiration Risk >No aspiration Noted  10/11 >Transfer to [facility]  10/13: Increased tachycardia, tachypnea and respiratory distress rapid response called, more agitated, recurrent atrial fibrillation with rapid ventricular response. 10/14: Critical care reconsulted for respiratory distress, intubated 10/20: one way extubation 10/25: CT abdomen pelvis significant for large extraperitoneal/retroperitoneal/abdominal wall bleed. 10/26: IVC filter placement IR   Subjective:    [name] today no significant events overnight, actually she does appear to be comfortable today .   Assessment  & Plan :    Active Problems:   Atrial fibrillation with rapid ventricular response s/p TEE-DCCV (02/17/14)   Pulmonary embolism (HCC)   Pressure injury of skin   Acute respiratory failure with hypoxia ([place])  Acute pulmonary embolism - with increased RV/LV ratio - Acute Hypoxic resp failure  -off anticoagulation since ICH in July - lytic tx was not indicated -Initially on Lovenox for anticoagulation, then  she was transitioned to Eliquis, currently off all forms of anticoagulation secondary to large life-threatening retroperitoneal/extraperitoneal/abdominal wall hematoma . -IVC filter placed by IR 07/04/2018 prevent progression of her PE, in the setting of known left lower extremity DVT. -Patient is comfort care currently  Bilateral lower extremity ischemia -Lower extremity appears to be mottled today, with significantly diminished pulses, and clammy, evidence of gangrene. -This is most likely in the setting of perfusion status secondary to profound anemia, and hypotension. -Vascular surgery input greatly appreciated, recommendation were for nonoperative management, -comfort care currently.  Extraperitoneal/retroperitoneal abdominal bleed with abdominal wall hematoma -Is most likely in the setting of anticoagulation secondary to acute PE and left lower extremity DVT. -Currently anticoagulation on hold, continue with supportive transfusion, required another 2 units PRBC transfusion overnight, so far 4 units. -He is comfort care currently, no further labs  Oliguric acute renal failure -Most likely in the setting of ATN, secondary to hypotension overnight, appears to be oliguric, unfortunately renal function continues to deteriorate despite appropriate volume resuscitation with IV fluid boluses and blood transfusions, .  Far had 200 cc urine output over last 48 hours and her Foley  bag. -Urology input appreciated, for abdominal bleed causing bladder compression, but no outlet obstruction  Pseudomonas PNA - Completed course of cefepime - afebrile w/ normal WBC   Metabolic encephalopathy - patient with baseline poor mental status secondary to IVH, complicated by long hospital stay, delirium.  Atrial fibrillation with RVR - A. fib with RVR hard to control, especially in the setting of hypotension, now blood pressure has improved, as well heart rate is improved after adding Toprol-XL, so for now continue with Cardizem and Toprol-XL . -Please see above discussion about anticoagulation . [name] Klebsiella urinary tract infection - tx completed   Anemia -Acute blood loss anemia in the setting of intra-abdominal bleed, continue with supportive care with transfusions, repeat CBC in the afternoon and transfuse if needed, she was transfused 2 units PRBC yesterday.   Constipation: Already on Colace.  Responded to MiraLAX  COPD without acute exacerbation Using Xopenex so as not to exacerbate RVR - no acute exacerbation   CAD with history of CABG  Hyperlipidemia  History of right MCA strokewith residual L sided weakness  IVHJuly 2019 resolved on CT head this admission  Pressure injuries left heel and left elbow WOC suggestions being followed   Goals of care -Unfortunately patient has staring events last July with her IVH, since then she continues to have rapid decline of her clinical condition and life quality with multiple hospitalizations since, currently she is having multiorgan failure, nonambulatory at baseline, with very poor prognosis, difficult amount of discomfort, I have discussed the son, plan is to proceed with comfort care measures today, I have stopped all nonessential medications, I have initiated palliative comfort care order set, discussed with palliative care, to evaluate today.  Code Status : DNR/comfort care  Family Communication  : None  at bedside  Disposition Plan  : Pending palliative care evaluation n   Consults  :  PCCM, urology,vascular ,IR  Procedures  : None  DVT Prophylaxis  : Has IVC filter Lab Results  Component Value Date   PLT 213 07/05/2018    Antibiotics  :    Anti-infectives (From admission, onward)   Start     Dose/Rate Route Frequency Ordered Stop   06/25/18 2200  ceFEPIme (MAXIPIME) 2 g in sodium chloride 0.9 % 100 mL IVPB     2 g 200 mL/hr over  30 Minutes Intravenous Every 12 hours 06/25/18 1012 06/28/18 2258   06/25/18 0000  vancomycin (VANCOCIN) IVPB 750 mg/150 ml premix  Status:  Discontinued     750 mg 150 mL/hr over 60 Minutes Intravenous Every 24 hours 06/24/18 1309 06/25/18 0926   06/24/18 2100  ceFEPIme (MAXIPIME) 1 g in sodium chloride 0.9 % 100 mL IVPB  Status:  Discontinued     1 g 200 mL/hr over 30 Minutes Intravenous Every 12 hours 06/24/18 1313 06/25/18 1012   06/24/18 1400  ceFEPIme (MAXIPIME) 2 g in sodium chloride 0.9 % 100 mL IVPB  Status:  Discontinued     2 g 200 mL/hr over 30 Minutes Intravenous Every 8 hours 06/24/18 1122 06/24/18 1313   06/22/18 2300  vancomycin (VANCOCIN) IVPB 750 mg/150 ml premix  Status:  Discontinued     750 mg 150 mL/hr over 60 Minutes Intravenous Every 12 hours 06/22/18 1049 06/24/18 1309   06/22/18 1200  metroNIDAZOLE (FLAGYL) IVPB 500 mg  Status:  Discontinued     500 mg 100 mL/hr over 60 Minutes Intravenous Every 8 hours 06/22/18 0815 06/24/18 1122   06/22/18 1100  vancomycin (VANCOCIN) 1,250 mg in sodium chloride 0.9 % 250 mL IVPB     1,250 mg 166.7 mL/hr over 90 Minutes Intravenous  Once 06/22/18 1030 06/22/18 1929   06/22/18 0600  metroNIDAZOLE (FLAGYL) IVPB 500 mg  Status:  Discontinued     500 mg 100 mL/hr over 60 Minutes Intravenous Every 8 hours 06/22/18 0543 06/22/18 0815   06/22/18 0545  ceFEPIme (MAXIPIME) 1 g in sodium chloride 0.9 % 100 mL IVPB  Status:  Discontinued     1 g 200 mL/hr over 30 Minutes Intravenous Every 12 hours  06/22/18 0543 06/24/18 1122   06/19/18 0100  cefTRIAXone (ROCEPHIN) 1 g in sodium chloride 0.9 % 100 mL IVPB     1 g 200 mL/hr over 30 Minutes Intravenous Every 24 hours 06/18/18 1004 06/22/18 1928   06/17/18 2130  cefTRIAXone (ROCEPHIN) 2 g in sodium chloride 0.9 % 100 mL IVPB  Status:  Discontinued     2 g 200 mL/hr over 30 Minutes Intravenous Every 24 hours 06/17/18 2002 06/18/18 1004        Objective:   Vitals:   07/05/18 0917 07/05/18 2318 07/06/18 0300 07/06/18 0844  BP:  124/82  123/88  Pulse:  86  87  Resp:  (!) 24  16  Temp:  98.5 F (36.9 C)  98.2 F (36.8 C)  TempSrc:  Oral  Oral  SpO2: 92% 97%  97%  Weight:   67.5 kg   Height:        Wt Readings from Last 3 Encounters:  07/06/18 67.5 kg  05/06/18 68 kg  04/27/18 68.2 kg     Intake/Output Summary (Last 24 hours) at 07/06/2018 1042 Last data filed at 07/06/2018 0300 Gross per 24 hour  Intake -  Output 200 ml  Net -200 ml     Physical Exam  Awake, appears to be more comfortable today, minimally verbal Symmetrical Chest wall movement, fair air entry bilaterally, no wheezing RRR,No Gallops,Rubs or new Murmurs, No Parasternal Heave +ve B.Sounds, Abd Soft, abdominal tenderness to palpation  Lower extremity no edema, actually they are much less mottled today    Data Review:    CBC Recent Labs  Lab 07/01/18 0418 07/03/18 1631 07/04/18 0545 07/04/18 1719 07/05/18 0550  WBC 4.5 9.5 16.1* 17.0* 15.2*  HGB 8.8* 7.1* 8.4* 6.6* 10.0*  HCT 28.7* 22.3* 25.7* 20.3* 29.7*  PLT 127* 192 230 227 213  MCV 95.7 96.1 86.5 88.3 88.1  MCH 29.3 30.6 28.3 28.7 29.7  MCHC 30.7 31.8 32.7 32.5 33.7  RDW 18.3* 18.3* 22.1* 22.5* 18.0*    Chemistries  Recent Labs  Lab 06/30/18 0349 07/01/18 0418 07/03/18 1631 07/04/18 0545 07/05/18 0550  NA 137 138 139 140 142  K 3.2* 3.6 3.9 4.7 5.2*  CL 98 99 105 104 105  CO2 31 32 _0 GLUCOSE 97 86 164* 182* 120*  BUN _1 25* 33*  CREATININE 0.90 0.96  1.33* 2.01* 2.64*  CALCIUM 8.1* 8.4* 8.7* 9.0 8.5*  MG  --  1.4*  --  1.5*  --   AST  --  19  --   --   --   ALT  --  10  --   --   --   ALKPHOS  --  53  --   --   --   BILITOT  --  0.6  --   --   --    ------------------------------------------------------------------------------------------------------------------ No results for input(s): CHOL, HDL, LDLCALC, TRIG, CHOLHDL, LDLDIRECT in the last 72 hours.  Lab Results  Component Value Date   HGBA1C 5.1 03/21/2018   ------------------------------------------------------------------------------------------------------------------ No results for input(s): TSH, T4TOTAL, T3FREE, THYROIDAB in the last 72 hours.  Invalid input(s): FREET3 ------------------------------------------------------------------------------------------------------------------ No results for input(s): VITAMINB12, FOLATE, FERRITIN, TIBC, IRON, RETICCTPCT in the last 72 hours.  Coagulation profile Recent Labs  Lab 07/04/18 0545  INR 1.66    No results for input(s): DDIMER in the last 72 hours.  Cardiac Enzymes No results for input(s): CKMB, TROPONINI, MYOGLOBIN in the last 168 hours.  Invalid input(s): CK ------------------------------------------------------------------------------------------------------------------    Component Value Date/Time   BNP 206.5 (H) 06/18/2018 0308    Inpatient Medications  Scheduled Meds: . chlorhexidine gluconate (MEDLINE KIT)  15 mL Mouth Rinse BID  . Chlorhexidine Gluconate Cloth  6 each Topical Daily  . docusate  100 mg Oral BID  . feeding supplement (ENSURE ENLIVE)  237 mL Oral BID BM  . furosemide  20 mg Intravenous Once  . mouth rinse  15 mL Mouth Rinse q12n4p  . povidone-iodine   Topical BID   Continuous Infusions:  PRN Meds:.acetaminophen **OR** acetaminophen, fentaNYL (SUBLIMAZE) injection, haloperidol **OR** haloperidol **OR** haloperidol lactate, levalbuterol, LORazepam **OR** LORazepam **OR** LORazepam,  metoprolol tartrate, ondansetron **OR** ondansetron (ZOFRAN) IV, sodium chloride flush  Micro Results No results found for this or any previous visit (from the past 240 hour(s)).  Radiology Reports Ct Abdomen Pelvis Wo Contrast  Result Date: 07/03/2018 CLINICAL DATA:  Suprapubic mass and pain. EXAM: CT ABDOMEN AND PELVIS WITHOUT CONTRAST TECHNIQUE: Multidetector CT imaging of the abdomen and pelvis was performed following the standard protocol without IV contrast. COMPARISON:  CT scan from 2012. FINDINGS: Lower chest: The lung bases are grossly clear. Moderate breathing motion artifact but no definite infiltrates or effusions. Streaky bibasilar atelectasis. Diastasis of the chest wall possibly related to prior trauma or median sternotomy. Hepatobiliary: No focal hepatic lesions or intrahepatic biliary dilatation. Layering gallstones noted the gallbladder. No common bile duct dilatation. Pancreas: No mass, inflammation or ductal dilatation. Moderate pancreatic atrophy. Spleen: Normal size.  No focal lesions. Adrenals/Urinary Tract: Mildly thickened adrenal glands bilaterally without discrete lesions. No renal calculi or mass. No ureteral calculi. The bladder is being compressed by a large left-sided extra peritoneal pelvic hematoma also involving the abdominal retroperitoneum. This measures approximately  11.3 x 11.7 x 8.5 cm. There is a Foley catheter in the bladder and small amount of air. Stomach/Bowel: Grossly normal. No inflammatory changes, mass lesions or obstructive findings. Small duodenal lipoma noted incidentally. Vascular/Lymphatic: Advanced atherosclerotic calcifications involving the aorta and iliac arteries and branch vessels. No mesenteric or retroperitoneal mass or adenopathy. Reproductive: Grossly normal. Other: Large left-sided retroperitoneal and extraperitoneal pelvic hematoma as detailed above. There is also a small hematoma in the anterior abdominal wall on the right side. There is also  a small left rectus muscle hematoma. Musculoskeletal: No significant bony findings. IMPRESSION: 1. Large left-sided extraperitoneal pelvic hematoma also in the lower abdominal retroperitoneum. Moderate mass effect on the bladder. 2. Small hematoma in the left rectus muscle and also in the right lower subcutaneous abdominal wall. 3. No intra-abdominal/intrapelvic mass or adenopathy. 4. Foley catheter in good position without complicating features. These results will be called to the ordering clinician or representative by the Radiologist Assistant, and communication documented in the PACS or zVision Dashboard. Electronically Signed   By: Marijo Sanes M.D.   On: 07/03/2018 15:38   Dg Abd 1 View - Kub  Result Date: 06/22/2018 CLINICAL DATA:  Orogastric tube placement. EXAM: ABDOMEN - 1 VIEW COMPARISON:  None. FINDINGS: The bowel gas pattern is normal. No radio-opaque calculi or other significant radiographic abnormality are seen. Distal tip of orogastric tube seen in expected position of proximal stomach. IMPRESSION: Distal tip of orogastric tube seen in expected position of proximal stomach. No evidence of bowel obstruction or ileus. Electronically Signed   By: Marijo Conception, M.D.   On: 06/22/2018 12:15   Ct Head Wo Contrast  Result Date: 06/21/2018 CLINICAL DATA:  Altered mental status. EXAM: CT HEAD WITHOUT CONTRAST TECHNIQUE: Contiguous axial images were obtained from the base of the skull through the vertex without intravenous contrast. COMPARISON:  CT head dated June 17, 2018. FINDINGS: Brain: No evidence of acute infarction, hemorrhage, hydrocephalus, extra-axial collection or mass lesion/mass effect. Unchanged chronic right MCA territory encephalomalacia and ex vacuo dilatation of the right lateral ventricle. Vascular: Atherosclerotic vascular calcification of the carotid siphons. No hyperdense vessel. Skull: Negative. Sinuses/Orbits: No acute finding. Unchanged small right mastoid effusion.  Prior left mastoidectomy. Other: None. IMPRESSION: 1.  No acute intracranial abnormality. 2. Chronic right MCA infarct. Electronically Signed   By: Titus Dubin M.D.   On: 06/21/2018 12:38   Ct Head Wo Contrast  Result Date: 06/17/2018 CLINICAL DATA:  62 year old female status post intraventricular hemorrhage in July. Now presenting with acute PE. Starting on heparin. EXAM: CT HEAD WITHOUT CONTRAST TECHNIQUE: Contiguous axial images were obtained from the base of the skull through the vertex without intravenous contrast. COMPARISON:  Head CTs 04/15/2018 and earlier. FINDINGS: Brain: Completely resolved intraventricular hemorrhage since July. Ex vacuo right lateral ventricle enlargement is related to the chronic right MCA territory encephalomalacia. No ventriculomegaly or transependymal edema. Stable gray-white matter differentiation throughout the brain. No midline shift, ventriculomegaly, mass effect, evidence of mass lesion, intracranial hemorrhage or evidence of cortically based acute infarction. Vascular: Mild Calcified atherosclerosis at the skull base. There is a degree of residual intravascular contrast from the chest CTA earlier today. Skull: Negative. Sinuses/Orbits: Improved paranasal sinus aeration throughout. Chronic left mastoidectomy. Stable mild right mastoid effusion. Other: No acute orbit or scalp soft tissue findings. IMPRESSION: 1. No acute intracranial abnormality. 2. Completely resolved intraventricular hemorrhage since July. 3. Chronic right MCA infarct. Electronically Signed   By: Genevie Ann M.D.   On: 06/17/2018  21:02   Ct Angio Chest Pe W And/or Wo Contrast  Result Date: 06/17/2018 CLINICAL DATA:  Cough and abnormal breath sounds with hypoxia EXAM: CT ANGIOGRAPHY CHEST WITH CONTRAST TECHNIQUE: Multidetector CT imaging of the chest was performed using the standard protocol during bolus administration of intravenous contrast. Multiplanar CT image reconstructions and MIPs were obtained  to evaluate the vascular anatomy. CONTRAST:  70 mL [id number] COMPARISON:  04/03/2011 FINDINGS: Cardiovascular: Thoracic aorta demonstrates atherosclerotic calcifications as well as changes of prior coronary bypass grafting. Mild cardiac enlargement is seen. Coronary calcifications are noted. The aorta is incompletely opacified precluding adequate evaluation. The pulmonary artery shows a normal branching pattern bilaterally. The left pulmonary artery and its branches are within normal limits. On the right however there is a large clot within the right main pulmonary artery and extending into the upper and lower lobe branches. The RV LV ratio is greater than 1 consistent with a degree of right heart strain. Mediastinum/Nodes: Thoracic inlet is within normal limits. No significant hilar or mediastinal adenopathy is noted. The esophagus as visualized is within normal limits. Lungs/Pleura: Lungs are well aerated bilaterally with emphysematous changes. Small bilateral pleural effusions and dependent atelectatic changes are seen. No findings to suggest pulmonary infarct are noted at this time. Mild alveolar edema is noted particularly in the right upper lobe. No focal confluent infiltrate is seen. Upper Abdomen: Cholelithiasis is noted without complicating factors. No other focal abnormality is noted. Musculoskeletal: Degenerative changes of the thoracic spine are seen. Chronic changes at the costosternal junction on the right is seen. Some mild herniation of abdominal fat is noted inferiorly below the xiphoid stable in appearance from a prior exam from 2012. Review of the MIP images confirms the above findings. IMPRESSION: Positive for acute PE primarily within the right pulmonary arterial system with CT evidence of right heart strain (RV/LV Ratio is greater than 1) consistent with at least submassive (intermediate risk) PE. The presence of right heart strain has been associated with an increased risk of morbidity and  mortality. Please activate Code PE by paging [phone]. Mild alveolar edema is noted particularly in the right upper lobe. Small effusions and bibasilar dependent atelectatic changes. Critical Value/emergent results were called by telephone at the time of interpretation on 06/17/2018 at 6:24 pm to Dr. Lennice Sites , who verbally acknowledged these results. Aortic Atherosclerosis (ICD10-I70.0) and Emphysema (ICD10-J43.9). Electronically Signed   By: Inez Catalina M.D.   On: 06/17/2018 18:28   Ir Ivc Filter Plmt / S&i /img Guid/mod Sed  Result Date: 07/04/2018 INDICATION: DVT and pulmonary embolism. Patient recently started on anti coagulation however has subsequently developed a symptomatic retroperitoneal hematoma. As such, request made for placement of an IVC filter for the purposes of caval interruption. Given the patient's multiple medical comorbidities, the IVC filter will be considered a permanent device. EXAM: ULTRASOUND GUIDANCE FOR VASCULAR ACCESS IVC CATHETERIZATION AND VENOGRAM IVC FILTER INSERTION COMPARISON:  None. MEDICATIONS: None. ANESTHESIA/SEDATION: Fentanyl 25 mcg IV; Versed 0.5 mg IV Sedation Time: 16 minutes; The patient was continuously monitored during the procedure by the interventional radiology nurse under my direct supervision. CONTRAST:  None, CO2 was utilized for this examination. FLUOROSCOPY TIME:  1 minute, 30 seconds (9 mGy) COMPLICATIONS: None immediate PROCEDURE: Informed consent was obtained from the patient following explanation of the procedure, risks, benefits and alternatives. The patient understands, agrees and consents for the procedure. All questions were addressed. A time out was performed prior to the initiation of the procedure. Maximal barrier  sterile technique utilized including caps, mask, sterile gowns, sterile gloves, large sterile drape, hand hygiene, and Betadine prep. Under sterile condition and local anesthesia, right internal jugular venous access was  performed with ultrasound. An ultrasound image was saved and sent to PACS. Over a guidewire, the IVC filter delivery sheath and inner dilator were advanced into the IVC just above the IVC bifurcation. CO2 injection was performed for an IVC venogram. Through the delivery sheath, a retrievable Denali IVC filter was deployed below the level of the renal veins and above the IVC bifurcation. Limited post deployment venacavagram was performed. The delivery sheath was removed and hemostasis was obtained with manual compression. A dressing was placed. The patient tolerated the procedure well without immediate post procedural complication. FINDINGS: The IVC is patent. CO2 venogram is limited secondary to patient's inability to cooperate with the imaging however there is no definitive evidence of thrombus, stenosis, or occlusion. No variant venous anatomy. Successful placement of the IVC filter below the level of the renal veins. Above also supported by imaging findings demonstrated on preceding noncontrast abdominal CT. IMPRESSION: Successful ultrasound and fluoroscopically guided placement of an infrarenal retrievable IVC filter via right jugular approach. PLAN: Due to patient related comorbidities and/or clinical necessity, this IVC filter should be considered a permanent device. This patient will not be actively followed for future filter retrieval. Electronically Signed   By: Sandi Mariscal M.D.   On: 07/04/2018 15:48   Dg Chest Port 1 View  Result Date: 07/03/2018 CLINICAL DATA:  Shortness of Breath EXAM: PORTABLE CHEST 1 VIEW COMPARISON:  June 28, 2018 FINDINGS: Endotracheal tube and nasogastric tube have been removed. Central catheter tip is in the superior vena cava. No pneumothorax. There is no evident edema or consolidation. Heart size and pulmonary vascularity are normal. Heart is upper normal in size with pulmonary vascularity normal. Patient is status post coronary artery bypass grafting. No bone lesions.  Postoperative changes are noted in the right upper thoracic region. IMPRESSION: No edema or consolidation. Stable cardiac silhouette. Central catheter tip in superior vena cava. No pneumothorax. Electronically Signed   By: Lowella Grip III M.D.   On: 07/03/2018 22:16   Dg Chest Port 1 View  Result Date: 06/28/2018 CLINICAL DATA:  Acute respiratory failure. EXAM: PORTABLE CHEST 1 VIEW COMPARISON:  One-view chest x-ray 06/27/2018 FINDINGS: Heart is enlarged. Endotracheal tube terminates 3.8 cm above the carina. A right side PICC line is stable. Side port of the NG tube is just beyond the GE junction. Aeration of both lungs is slightly improved. Small effusions and bibasilar atelectasis remain. IMPRESSION: 1. Improving aeration of both lungs. 2. Residual small bilateral pleural effusions and associated atelectasis. 3. Support apparatus stable. Electronically Signed   By: San Morelle M.D.   On: 06/28/2018 10:06   Dg Chest Port 1 View  Result Date: 06/27/2018 CLINICAL DATA:  Acute respiratory failure. EXAM: PORTABLE CHEST 1 VIEW COMPARISON:  One-view chest x-ray 06/26/2018 FINDINGS: Endotracheal tube terminates 2.5 cm above the carina. NG tube courses off the inferior border the film. A right-sided PICC line is in place. Heart is enlarged. CABG is noted. Interstitial edema is slightly improved. Bilateral pleural effusions remain. Bibasilar airspace disease likely reflects atelectasis. IMPRESSION: 1. Support apparatus stable and in satisfactory position. 2. Slight decrease in interstitial edema. 3. Cardiomegaly with persistent bilateral effusions and basilar airspace disease, likely atelectasis. Findings are consistent with congestive heart failure. Electronically Signed   By: San Morelle M.D.   On: 06/27/2018 07:28  Dg Chest Port 1 View  Result Date: 06/26/2018 CLINICAL DATA:  Acute respiratory failure EXAM: PORTABLE CHEST 1 VIEW COMPARISON:  06/25/2018 FINDINGS: Cardiac shadow  remains enlarged. Postsurgical changes are again seen. The endotracheal tube, right-sided PICC line and nasogastric catheter are again seen and stable. Bilateral pleural effusions are noted. Left retrocardiac consolidation is seen. IMPRESSION: Bilateral pleural effusions with left basilar consolidation. Tubes and lines as described. Electronically Signed   By: Inez Catalina M.D.   On: 06/26/2018 07:15   Dg Chest Port 1 View  Result Date: 06/25/2018 CLINICAL DATA:  Acute respiratory failure. EXAM: PORTABLE CHEST 1 VIEW COMPARISON:  06/24/2018 FINDINGS: Endotracheal tube is 2.0 cm above the carina. PICC line tip in the lower SVC region. Nasogastric tube extends into the abdomen. Persistent basilar chest densities are most compatible with pleural fluid and volume loss. Negative for a pneumothorax. Stable hazy densities in the medial right upper lung. Heart size is upper limits of normal but stable. IMPRESSION: 1. Stable position of the support apparatuses. 2. No significant change in the lung disease. Persistent basilar chest densities are most compatible with pleural fluid and atelectasis/consolidation. Electronically Signed   By: Markus Daft M.D.   On: 06/25/2018 09:11   Dg Chest Port 1 View  Result Date: 06/24/2018 CLINICAL DATA:  Insertion of central venous catheter, history of atrial fibrillation and cardiomyopathy, prior CABG with eventual debridement for infection EXAM: PORTABLE CHEST 1 VIEW COMPARISON:  Chest x-ray of 06/24/2018 and CT chest of 06/17/2017 FINDINGS: The tip of the endotracheal tube is very near the carina, less than 1 cm above the carina and withdrawing by 2 cm is recommended. Right central venous line tip is seen to the mid low SVC. No pneumothorax is seen. Cardiomegaly and mild pulmonary vascular congestion is present with probable small effusions left-greater-than-right. NG tube extends below the hemidiaphragm. IMPRESSION: 1. Right central venous line tip seen to the lower SVC. No  pneumothorax. 2. Cardiomegaly with mild CHF and small effusions, left greater than right. 3. Endotracheal tube tip very near the carina. Recommend retracting by approximately 2 cm. Electronically Signed   By: Ivar Drape M.D.   On: 06/24/2018 12:04   Dg Chest Port 1 View  Result Date: 06/24/2018 CLINICAL DATA:  Patient admitted 06/17/2018 with hypoxia and pulmonary embolus. Intubated. EXAM: PORTABLE CHEST 1 VIEW COMPARISON:  Single-view of the chest 06/23/2018 and 06/22/2018. FINDINGS: The patient's neck appears to be in flexion. Endotracheal tube is 0.8 cm above the carina. NG tube courses into the stomach and below the inferior margin the film. Left greater than right pleural effusions and basilar airspace disease appear similar to yesterday's exam. Heart size is enlarged. No pneumothorax. Surgical clips right axilla noted. IMPRESSION: No notable change in left greater than right pleural effusions and basilar airspace disease, likely atelectasis. Endotracheal tube tip is 0.8 cm above the carina which may be secondary to the patient's neck being in flexion. Electronically Signed   By: Inge Rise M.D.   On: 06/24/2018 10:05   Portable Chest Xray  Result Date: 06/23/2018 CLINICAL DATA:  Shortness of breath. EXAM: PORTABLE CHEST 1 VIEW COMPARISON:  Radiograph of June 22, 2018. FINDINGS: Stable cardiomegaly. Endotracheal and nasogastric tubes are unchanged in position. Status post coronary artery bypass graft. Increased central pulmonary vascular congestion and bilateral pulmonary edema is noted. Mild to moderate left pleural effusion is noted with associated atelectasis. No pneumothorax is noted. Bony thorax is unremarkable. IMPRESSION: Increased central pulmonary vascular congestion and bilateral  pulmonary edema, with mild to moderate left pleural effusion with associated atelectasis. Stable support apparatus. Electronically Signed   By: Marijo Conception, M.D.   On: 06/23/2018 08:42   Dg Chest  Port 1 View  Result Date: 06/22/2018 CLINICAL DATA:  Endotracheal tube placement. EXAM: PORTABLE CHEST 1 VIEW COMPARISON:  Radiograph of same day. FINDINGS: Stable cardiomegaly. Status post coronary artery bypass graft. Endotracheal tube is projected over tracheal air shadow with distal tip 2 cm above the carina. Nasogastric tube is seen entering stomach. No pneumothorax is noted. Mild bibasilar subsegmental atelectasis is noted with probable small pleural effusions. Bony thorax is unremarkable. Right axillary surgical clips are noted. IMPRESSION: Endotracheal tube in grossly good position. Interval placement of nasogastric tube. Probable mild bibasilar subsegmental atelectasis with small pleural effusions. Electronically Signed   By: Marijo Conception, M.D.   On: 06/22/2018 12:14   Dg Chest Port 1 View  Result Date: 06/22/2018 CLINICAL DATA:  Shortness of breath. Aspiration pneumonia. Recent pulmonary emboli. EXAM: PORTABLE CHEST 1 VIEW COMPARISON:  Chest x-rays dated 06/21/2018 and 06/17/2018 and chest CT dated 06/17/2018. FINDINGS: There is new slight hazy perihilar pulmonary vascular congestion bilaterally. Small bilateral effusions, less prominent on right. Overall heart size is normal. CABG. No acute bone abnormality. IMPRESSION: New slight pulmonary vascular congestion. Small bilateral effusions, diminished on the right. Electronically Signed   By: Lorriane Shire M.D.   On: 06/22/2018 10:23   Dg Chest Port 1 View  Result Date: 06/21/2018 CLINICAL DATA:  Patient with acute respiratory distress. EXAM: PORTABLE CHEST 1 VIEW COMPARISON:  Chest radiograph 06/17/2018 FINDINGS: Monitoring leads overlie the patient. Stable cardiomegaly. New moderate left pleural effusion with underlying opacities. Minimal right basilar heterogeneous opacities. IMPRESSION: Cardiomegaly. New moderate left effusion with underlying opacities which may represent atelectasis or infection. Heterogeneous opacities right lung base  may represent atelectasis. Electronically Signed   By: Lovey Newcomer M.D.   On: 06/21/2018 20:33   Dg Chest Portable 1 View  Result Date: 06/17/2018 CLINICAL DATA:  Cough and rhonchi. EXAM: PORTABLE CHEST 1 VIEW COMPARISON:  05/05/2018 FINDINGS: Stable cardiomegaly with post CABG change. Nonaneurysmal slightly atherosclerotic aorta. No overt pulmonary edema, consolidation, effusion or pneumothorax. Mild chronic interstitial prominence redemonstrated of the lungs. No acute osseous abnormality. IMPRESSION: Stable cardiomegaly with minimal aortic atherosclerosis and post CABG change. No active pulmonary disease. Electronically Signed   By: Ashley Royalty M.D.   On: 06/17/2018 16:01   Dg Swallowing Func-speech Pathology  Result Date: 06/29/2018 Objective Swallowing Evaluation: Type of Study: MBS-Modified Barium Swallow Study  Patient Details Name: KYLIAH DEANDA MRN: [id number] Date of Birth: 03-12-1956 Today's Date: 06/29/2018 Time: SLP Start Time (ACUTE ONLY): 1211 -SLP Stop Time (ACUTE ONLY): 1232 SLP Time Calculation (min) (ACUTE ONLY): 21 min Past Medical History: Past Medical History: Diagnosis Date . Acute right MCA stroke ([place]) 11/07/10 . Atrial fibrillation ([place])   a. s/p TEE-DCCV 02/2104; b. Xarelto started . CAD (coronary artery disease)   a.  cath 09/2010: LAD stent patent, S-Int/dCFX ok, S-PDA ok, L-LAD atretic;  b. Lexiscan Myoview (02/2014):  no ischemia, EF 55%; c. 11/2014 Cath/PCI: LM nl, LAD 20pISR, LCX 80-88m OM1 nl, RI 70p, RCA 40-581mRPDA 95ost/95-9558mTCA only w/ reduction to 50p/49m87m0d, L->LAD atretic, VG->RI->OM nl, VG->PDA 100p. . Cardiomyopathy with EF 40% at TEE 02/17/14 (likely tachycardia mediated - Myoview 02/19/14 neg for ischemia with normal EF) 02/18/2014 . COPD (chronic obstructive pulmonary disease) (HCC)[place] Depression  . Eczema  .  GERD (gastroesophageal reflux disease)  . Headache  . HLD (hyperlipidemia)  . Homelessness 11/12/2011 . 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 Hx MRSA infection   Chest wall syndrome post CABG . Hx of CABG  . Hx of transesophageal echocardiography (TEE) for monitoring 11/2010  TEE 11/2010: EF 60-65%, BAE, trivial atrial septal shunt;  right heart cath in 10/2010 with elevated R and L heart pressures and diuretic started . Hypertension  . Hypothyroidism  . Persistent atrial fibrillation 10/2014 . Pulmonary nodules   repeat CT due in 11/2011 . Sleep apnea   recent sleep study  in 04/2014 per chart review  shows no significant OSA Past Surgical History: Past Surgical History: Procedure Laterality Date . BLADDER SURGERY   . CARDIAC CATHETERIZATION  2012 . CARDIOVERSION N/A 02/17/2014  Procedure: CARDIOVERSION;  Surgeon: Fay Records, MD;  Location: [facility] ENDOSCOPY;  Service: Cardiovascular;  Laterality: N/A; . CARDIOVERSION N/A 09/20/2016  Procedure: CARDIOVERSION;  Surgeon: Dorothy Spark, MD;  Location: [place];  Service: Cardiovascular;  Laterality: N/A; . CHEST WALL RECONSTRUCTION   . CORONARY ARTERY BYPASS GRAFT   . debriment for infection in chest   . HERNIA REPAIR   . KNEE ARTHROSCOPY Bilateral  . LEFT AND RIGHT HEART CATHETERIZATION WITH CORONARY ANGIOGRAM N/A 11/10/2014  Procedure: LEFT AND RIGHT HEART CATHETERIZATION WITH CORONARY ANGIOGRAM;  Surgeon: Leonie Man, MD;  Location: [facility] CATH LAB;  Service: Cardiovascular;  Laterality: N/A; . Left mastoidectomy   . TEE WITHOUT CARDIOVERSION N/A 02/17/2014  Procedure: TRANSESOPHAGEAL ECHOCARDIOGRAM (TEE);  Surgeon: Fay Records, MD;  Location: [facility] ENDOSCOPY;  Service: Cardiovascular;  Laterality: N/A; HPI: 62 year old female hx CVA, COPD, GERD, who resides in SNF after recent [place] / IVH with L sided weakness. Now presenting with hypoxia and PE. Started on heparin infusion. No h/o dysphagia documented but pt reports a h/o needing thickened liquids. BSE completed this admission on 10/10 with slight cough/throat clear not directly indicative of decreased airway protection. Regular diet/thin  liquids recommended; SLP f/u x1 and s/o. On 10/14 pt became progressively lethargic with tachypnea and hypoxia; she was transferred back to ICU and intubated 10/14; one-way extubation 10/20.  Subjective: pt quicker to respond than she was this morning Assessment / Plan / Recommendation CHL IP CLINICAL IMPRESSIONS 06/29/2018 Clinical Impression Pt presents with a moderate oral and mild pharyngeal dysphagia. Her mentation is brighter than it was even this morning, with quicker responses and more prolonged utterances. She has oral holding of all textures, requiringMax faded to Mod verbal cueing to initiated posterior transit. She has piecemeal swallowing, consistently swallowing boluses in two parts, and needing consistent cueing to swallow both parts. Across the study, her timing did begin to improve with cueing. Her pharyngeal phase demonstrated good timing and and strength across consistencies, although she did have a single episode of premature spillage into the airway while consuming thin liquids via straw. When she did aspirate, it was sensed, triggering a cough response with only a second or two of delay. Recommend starting with Dys 1 diet and thin liquids via cup. Prognosis is good for advancement with additional time post-extubation, clearing of mental status, and reintroduction of PO diet. SLP will f/u for tolerance and ready to advance as swallow becomes swifter. SLP Visit Diagnosis Dysphagia, oropharyngeal phase (R13.12) Attention and concentration deficit following -- Frontal lobe and executive function deficit following -- Impact on safety and function Mild aspiration risk   CHL  IP TREATMENT RECOMMENDATION 06/29/2018 Treatment Recommendations Therapy as outlined in treatment plan below   Prognosis 06/29/2018 Prognosis for Safe Diet Advancement Good Barriers to Reach Goals -- Barriers/Prognosis Comment -- CHL IP DIET RECOMMENDATION 06/29/2018 SLP Diet Recommendations Dysphagia 1 (Puree) solids;Thin liquid  Liquid Administration via Cup;No straw Medication Administration Whole meds with puree Compensations Minimize environmental distractions;Slow rate;Small sips/bites Postural Changes Seated upright at 90 degrees   CHL IP OTHER RECOMMENDATIONS 06/29/2018 Recommended Consults -- Oral Care Recommendations Oral care BID Other Recommendations --   CHL IP FOLLOW UP RECOMMENDATIONS 06/29/2018 Follow up Recommendations Skilled Nursing facility   [facility] IP FREQUENCY AND DURATION 06/29/2018 Speech Therapy Frequency (ACUTE ONLY) min 2x/week Treatment Duration 2 weeks      CHL IP ORAL PHASE 06/29/2018 Oral Phase Impaired Oral - Pudding Teaspoon -- Oral - Pudding Cup -- Oral - Honey Teaspoon -- Oral - Honey Cup -- Oral - Nectar Teaspoon -- Oral - Nectar Cup -- Oral - Nectar Straw -- Oral - Thin Teaspoon Holding of bolus;Piecemeal swallowing Oral - Thin Cup Holding of bolus;Piecemeal swallowing Oral - Thin Straw Holding of bolus;Piecemeal swallowing;Premature spillage Oral - Puree Holding of bolus;Piecemeal swallowing Oral - Mech Soft Holding of bolus;Piecemeal swallowing Oral - Regular -- Oral - Multi-Consistency -- Oral - Pill Holding of bolus Oral Phase - Comment --  CHL IP PHARYNGEAL PHASE 06/29/2018 Pharyngeal Phase Impaired Pharyngeal- Pudding Teaspoon -- Pharyngeal -- Pharyngeal- Pudding Cup -- Pharyngeal -- Pharyngeal- Honey Teaspoon -- Pharyngeal -- Pharyngeal- Honey Cup -- Pharyngeal -- Pharyngeal- Nectar Teaspoon -- Pharyngeal -- Pharyngeal- Nectar Cup -- Pharyngeal -- Pharyngeal- Nectar Straw -- Pharyngeal -- Pharyngeal- Thin Teaspoon WFL Pharyngeal -- Pharyngeal- Thin Cup WFL Pharyngeal -- Pharyngeal- Thin Straw Penetration/Aspiration before swallow Pharyngeal Material enters airway, passes BELOW cords and not ejected out despite cough attempt by patient;Material enters airway, passes BELOW cords then ejected out Pharyngeal- Puree WFL Pharyngeal -- Pharyngeal- Mechanical Soft WFL Pharyngeal -- Pharyngeal- Regular --  Pharyngeal -- Pharyngeal- Multi-consistency -- Pharyngeal -- Pharyngeal- Pill WFL Pharyngeal -- Pharyngeal Comment --  CHL IP CERVICAL ESOPHAGEAL PHASE 06/29/2018 Cervical Esophageal Phase WFL Pudding Teaspoon -- Pudding Cup -- Honey Teaspoon -- Honey Cup -- Nectar Teaspoon -- Nectar Cup -- Nectar Straw -- Thin Teaspoon -- Thin Cup -- Thin Straw -- Puree -- Mechanical Soft -- Regular -- Multi-consistency -- Pill -- Cervical Esophageal Comment -- Germain Osgood 06/29/2018, 1:24 PM Germain Osgood, M.A. CCC-SLP Acute Rehabilitation Services Pager [phone] Office [phone]              [place] Ekg Site Rite  Result Date: 06/23/2018 If [facility] image not attached, placement could not be confirmed due to current cardiac rhythm.    Phillips Climes M.D on 07/06/2018 at 10:42 AM  Between 7am to 7pm - Pager - [phone]  After 7pm go to [email or web address] - password [facility]  [facility] -  Office  [phone]

## 2018-07-06 NOTE — Consult Note (Signed)
Consultation Note Date: 07/06/18  Patient Name: [name]  DOB: February 19, 1956  MRN: [id number]  Age / Sex: 62 y.o., female  PCP: Regis Bill Standley Brooking, MD Referring Physician: Albertine Patricia, MD  Reason for Consultation: Establishing goals of care and Terminal Care  HPI/Patient Profile: 62 y.o. female  with past medical history of recent ICH/IVH while on zarelto, afib, COPD, CHF, CVA, smoker, OSA, HTN, thyroid disease admitted on 06/17/2018 with hypoxia. CTA positive for acute PE with evidence of right heart strain. Patient with acute respiratory failure requiring intubation/mechanical ventilation, successfully extubated. Course of hospitalization complicated by acute blood loss anemia secondary to retro/extraperitoneal hemorrhage/abdominal wall hematoma, oliguric acute renal failure, metabolic encephalopathy, atrial fibrillation with RVR, Klebsiella UTI, and pseudomonas PNA. Poor prognosis. Palliative medicine consultation for goals of care/terminal care.   Clinical Assessment and Goals of Care:  I have reviewed medical records, discussed with care team, and met with patient's son Mitzi Hansen) and niece Almyra Free) to discuss diagnosis, prognosis, GOC, EOL wishes, disposition and options.  Introduced Palliative Medicine as specialized medical care for people living with serious illness. It focuses on providing relief from the symptoms and stress of a serious illness.   We discussed a brief life review of the patient. The patient has two children but other son is incarcerated. Mitzi Hansen shares that Patina has "alienated" herself from all family besides him, which became worse after CVA in February 2012. Since her recent stroke, her health has continued to decline and he believes his mother has been depressed with losing her independence. Prior to hospitalization, she was living at [facility].   Discussed events leading up  to hospitalization and course of hospitalization including diagnoses and interventions.   I attempted to elicit values and goals of care important to the patient and son. Advanced directives, concepts specific to code status, artifical feeding and hydration, and rehospitalization were considered and discussed. Mitzi Hansen shares that his mother has always spoke of her wishes against heroic measures including resuscitation, life support, and feeding tubes. She would not want to be a "vegetable." He shares that she would not wish for this poor quality of life. Mitzi Hansen confirms DNR/DNI and NO feeding tube, understanding poor prognosis with declining nutritional status.   The difference between aggressive medical intervention and comfort care was considered in light of the patient's goals of care. Mitzi Hansen recalls his conversation with Dr. Waldron Labs and recommendation for comfort. Mitzi Hansen states "I just want her to be comfortable." Discussed shift to comfort with focus on comfort and dignity at EOL. Educated on medications as needed to ensure relief from suffering.    Introduced hospice options and philosophy. Mitzi Hansen is interested in transfer to hospice facility if she remains stable for transfer.  Questions and concerns were addressed. PMT contact information given. Emotional support provided.     SUMMARY OF RECOMMENDATIONS    Son confirms patient's wishes against heroic measures including DNR/DNI and NO feeding tube.   Comfort measures only. Interventions not aimed at comfort have been discontinued.   Symptom  management--see below.   SW consulted for residential hospice placement.   Comfort feeds per patient/family request.   Code Status/Advance Care Planning:  DNR  Symptom Management:   Fentanyl 25-83mg IV q1h prn pain/dyspnea/air hunger  Robinul 0.246mIV q4h prn secretions  Ativan 47m74mV q4h prn anxiety  Palliative Prophylaxis:   Aspiration, Delirium Protocol, Frequent Pain Assessment,  Oral Care and Turn Reposition  Additional Recommendations (Limitations, Scope, Preferences):  Full Comfort Care  Psycho-social/Spiritual:   Desire for further Chaplaincy support:yes  Additional Recommendations: Caregiving  Support/Resources, Compassionate Wean Education and Education on Hospice  Prognosis:   < 2 weeks likely days with acute PE/DVT not on anticoagulation, retroperitoneal/abdominal wall bleed, acute respiratory failure, acute renal failure, anemia, and severe decline in functional/cognitive/nutritional status.   Discharge Planning: Hospice facility      Primary Diagnoses: Present on Admission: . Pulmonary embolism ([facility] I have reviewed the medical record, interviewed the patient and family, and examined the patient. The following aspects are pertinent.  Past Medical History:  Diagnosis Date  . Acute right MCA stroke ([facility]12  . Atrial fibrillation ([facility]  a. s/p TEE-DCCV 02/2104; b. Xarelto started  . CAD (coronary artery disease)    a.  cath 09/2010: LAD stent patent, S-Int/dCFX ok, S-PDA ok, L-LAD atretic;  b. Lexiscan Myoview (02/2014):  no ischemia, EF 55%; c. 11/2014 Cath/PCI: LM nl, LAD 20pISR, LCX 80-65m98m1 nl, RI 70p, RCA 40-80m,26mA 95ost/95-25m (55m only w/ reduction to 50p/74m), 747m L->LAD atretic, VG->RI->OM nl, VG->PDA 100p.  . Cardiomyopathy with EF 40% at TEE 02/17/14 (likely tachycardia mediated - Myoview 02/19/14 neg for ischemia with normal EF) 02/18/2014  . COPD (chronic obstructive pulmonary disease) (HCC)   [place]   . Eczema   . GERD (gastroesophageal reflux disease)   . Headache   . HLD (hyperlipidemia)   . Homelessness 11/12/2011  . 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 Hx of CABG   . Hx of transesophageal echocardiography (TEE) for monitoring 11/2010   TEE 11/2010: EF 60-65%, BAE, trivial atrial septal shunt;  right heart cath in 10/2010  with elevated R and L heart pressures and diuretic started  . Hypertension   . Hypothyroidism   . Persistent atrial fibrillation 10/2014  . Pulmonary nodules    repeat CT due in 11/2011  . Sleep apnea    recent sleep study  in 04/2014 per chart review  shows no significant OSA   Social History   Socioeconomic History  . Marital status: Divorced    Spouse name: Not on file  . Number of children: 2  . Years of education: Not on file  . Highest education level: Not on file  Occupational History  . Occupation: DISABLE   Social Needs  . Financial resource strain: Not on file  . Food insecurity:    Worry: Not on file    Inability: Not on file  . Transportation needs:    Medical: Not on file    Non-medical: Not on file  Tobacco Use  . Smoking status: Current Every Day Smoker    Packs/day: 0.50    Years: 46.00    Pack years: 23.00    Types: Cigarettes  . Smokeless tobacco: Never Used  . Tobacco comment: 05/05/2018 "quit a couple weeks ago"  Substance and Sexual Activity  . Alcohol use:  No    Alcohol/week: 0.0 standard drinks  . Drug use: No  . Sexual activity: Not Currently  Lifestyle  . Physical activity:    Days per week: Not on file    Minutes per session: Not on file  . Stress: Not on file  Relationships  . Social connections:    Talks on phone: Not on file    Gets together: Not on file    Attends religious service: Not on file    Active member of club or organization: Not on file    Attends meetings of clubs or organizations: Not on file    Relationship status: Not on file  Other Topics Concern  . Not on file  Social History Narrative   8/11   Divorced   After stroke was living with sister and mother after sister asked her to leave. Mom has since deceased    Difficulties over control.    Then moved back in for about 6 weeks.   . tranportaion difficult son  helping   Ex-smoker   Was living at friends  Sister and her had argument and she was told to leave .     She was living in a homeless shelter for the last 3 weeks [facility]    son had help with transportation and medication       Work status regular  before got sick on short-term disability now lost t insurance after the stroke and couldn't work was  denied ssi    2 times.  She is not eligible for Medicaid because she doesn't have dependent children.         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   Lives in  rented house 2 house mates males keep tp self has her own b room  Doing well    Family History  Problem Relation Age of Onset  . COPD Mother   . Heart disease Mother   . Arthritis Mother        Rheumatoid and PMR  . Osteoporosis Mother        Mom fractured hip  . Diabetes type II Mother   . Depression Mother   . Heart attack Father   . Depression Father   . Hypertension Father   . Alcohol abuse Father   . Depression Sister   . Anxiety disorder Sister   . Drug abuse Sister   . Stroke Neg Hx    Scheduled Meds: . chlorhexidine gluconate (MEDLINE KIT)  15 mL Mouth Rinse BID  . furosemide  20 mg Intravenous Once  . mouth rinse  15 mL Mouth Rinse q12n4p  . povidone-iodine   Topical BID   Continuous Infusions: PRN Meds:.acetaminophen **OR** acetaminophen, fentaNYL (SUBLIMAZE) injection, glycopyrrolate, haloperidol **OR** haloperidol **OR** haloperidol lactate, levalbuterol, LORazepam **OR** LORazepam **OR** LORazepam, metoprolol tartrate, ondansetron **OR** ondansetron (ZOFRAN) IV, sodium chloride flush Medications Prior to Admission:  Prior to Admission medications   Medication Sig Start Date End Date Taking? Authorizing Provider  atorvastatin (LIPITOR) 40 MG tablet Take 1 tablet (40 mg total) by mouth daily at 6 PM. 03/24/18  Yes Biby, Massie Kluver, NP  cetirizine  (ZYRTEC) 10 MG tablet Take 10 mg by mouth daily.   Yes [provider]  dicyclomine (  BENTYL) 20 MG tablet Take 20 mg by mouth 2 (two) times daily.   Yes [provider]  diphenoxylate-atropine (LOMOTIL) 2.5-0.025 MG tablet Take 1 tablet by mouth 3 (three) times daily as needed for diarrhea or loose stools.    Yes [provider]  ENSURE (ENSURE) Take 237 mLs by mouth 3 (three) times daily between meals.   Yes [provider]  FLUoxetine (PROZAC) 20 MG capsule Take 20 mg by mouth daily.    Yes [provider]  gabapentin (NEURONTIN) 300 MG capsule Take 300 mg by mouth 3 (three) times daily.    Yes [provider]  lisinopril (PRINIVIL,ZESTRIL) 10 MG tablet Take 10 mg by mouth every evening.    Yes [provider]  ondansetron (ZOFRAN) 4 MG tablet Take 4 mg by mouth every 6 (six) hours as needed for nausea or vomiting.    Yes [provider]  pantoprazole (PROTONIX) 40 MG tablet Take 1 tablet (40 mg total) by mouth daily. 05/09/18  Yes Shelly Coss, MD   Allergies  Allergen Reactions  . Avelox [Moxifloxacin Hcl In Nacl] Other (See Comments)    Mental breakdown  . Amoxicillin Hives  . Pamelor [Nortriptyline Hcl] Other (See Comments)    Made her want to hurt herself  . Penicillins Hives     Has patient had a PCN reaction causing immediate rash, facial/tongue/throat swelling, SOB or lightheadedness with hypotension: Yes Has patient had a PCN reaction causing severe rash involving mucus membranes or skin necrosis: Unk Has patient had a PCN reaction that required hospitalization: Unk Has patient had a PCN reaction occurring within the last 10 years: Unk If all of the above answers are "NO", then may proceed with Cephalosporin use.   . Other Itching    Any med that ends in -caine  . Hydrocodone Itching  . Oxycodone Itching  . Sulfa Antibiotics Other (See Comments)    "Allergic," per MAR   Review of Systems  Unable to  perform ROS: Acuity of condition   Physical Exam  Constitutional: She appears cachectic. She is easily aroused. She appears ill.  HENT:  Head: Normocephalic and atraumatic.  Pulmonary/Chest: Accessory muscle usage present. No tachypnea. No respiratory distress. She has decreased breath sounds. She has rhonchi.  Abdominal: There is no tenderness.  Neurological: She is easily aroused.  Eyes open, nonverbal and not following commands. Appears uncomfortable with slight agitation/increased WOB during visit. RN to give prn fentanyl.   Skin: Skin is warm and dry. Ecchymosis noted. There is pallor.  Psychiatric: Cognition and memory are impaired. She is noncommunicative. She is inattentive.  Nursing note and vitals reviewed.   Vital Signs: BP 99/69 (BP Location: Left Arm)   Pulse 75   Temp 98.1 F (36.7 C) (Axillary)   Resp 20   Ht '5\' 3"'  (1.6 m)   Wt 67.4 kg   SpO2 99%   BMI 26.32 kg/m  Pain Scale: CPOT   Pain Score: Asleep   SpO2: SpO2: 99 % O2 Device:SpO2: 99 % O2 Flow Rate: .O2 Flow Rate (L/min): 5 L/min  IO: Intake/output summary:   Intake/Output Summary (Last 24 hours) at 07/07/2018 0918 Last data filed at 07/07/2018 0400 Gross per 24 hour  Intake 0 ml  Output 525 ml  Net -525 ml    LBM: Last BM Date: 07/03/18 Baseline Weight: Weight: 68 kg Most recent weight: Weight: 67.4 kg     Palliative Assessment/Data: PPS 20%   Flowsheet Rows  Most Recent Value  Intake Tab  Referral Department  Hospitalist  Unit at Time of Referral  Med/Surg Unit  Palliative Care Primary Diagnosis  -- [multi-organ failure]  Palliative Care Type  New Palliative care  Reason for referral  Clarify Goals of Care  Date first seen by Palliative Care  07/06/18  Clinical Assessment  Palliative Performance Scale Score  20%  Psychosocial & Spiritual Assessment  Palliative Care Outcomes  Patient/Family meeting held?  Yes  Who was at the meeting?  son, niece  Palliative Care Outcomes   Clarified goals of care, Improved pain interventions, Improved non-pain symptom therapy, Counseled regarding hospice, Provided end of life care assistance, Transitioned to hospice, ACP counseling assistance, Provided psychosocial or spiritual support, Changed to focus on comfort      Time In/Out: 4373-5789  Time Total: 6mn Greater than 50%  of this time was spent counseling and coordinating care related to the above assessment and plan.  Signed by:  MIhor Dow FNP-C Palliative Medicine Team  Phone: [phone]: [phone]  Please contact Palliative Medicine Team phone at [phone] questions and concerns.  For individual provider: See AShea Evans

## 2018-07-06 NOTE — Patient Outreach (Signed)
[facility]) Care Management  07/06/2018  [name] 1955-10-22 [id number]  Patient remains inpatient at this time. [facility] CSW will continue to monitor case closely and await for hospital discharge.   [name], BSW, MSW, LCSW [facility] [email or web address] Phone: [phone] Fax: [phone]

## 2018-07-07 ENCOUNTER — Other Ambulatory Visit: Payer: Self-pay | Admitting: Licensed Clinical Social Worker

## 2018-07-07 DIAGNOSIS — R58 Hemorrhage, not elsewhere classified: Secondary | ICD-10-CM

## 2018-07-07 DIAGNOSIS — Z515 Encounter for palliative care: Secondary | ICD-10-CM

## 2018-07-07 DIAGNOSIS — K117 Disturbances of salivary secretion: Secondary | ICD-10-CM

## 2018-07-07 MED ORDER — SCOPOLAMINE 1 MG/3DAYS TD PT72: 1.0000 | MEDICATED_PATCH | TRANSDERMAL | 12 refills | Status: AC

## 2018-07-07 MED ORDER — HALOPERIDOL 0.5 MG PO TABS: 0.5000 mg | ORAL_TABLET | ORAL | Status: AC | PRN

## 2018-07-07 MED ORDER — ONDANSETRON 4 MG PO TBDP: 4.0000 mg | ORAL_TABLET | Freq: Four times a day (QID) | ORAL | 0 refills | Status: AC | PRN

## 2018-07-07 MED ORDER — ACETAMINOPHEN 325 MG PO TABS: 650.0000 mg | ORAL_TABLET | Freq: Four times a day (QID) | ORAL | Status: AC | PRN

## 2018-07-07 MED ORDER — SCOPOLAMINE 1 MG/3DAYS TD PT72
1.0000 | MEDICATED_PATCH | TRANSDERMAL | Status: DC
  Administered 2018-07-07: 1.5 mg via TRANSDERMAL
  Filled 2018-07-07: qty 1

## 2018-07-07 MED ORDER — LORAZEPAM 1 MG PO TABS: 1.0000 mg | ORAL_TABLET | ORAL | 0 refills | Status: AC | PRN

## 2018-07-07 MED ORDER — FENTANYL CITRATE (PF) 100 MCG/2ML IJ SOLN: 25.0000 ug | INTRAMUSCULAR | 0 refills | Status: AC | PRN

## 2018-07-07 NOTE — Consult Note (Signed)
   [facility] CM Inpatient Consult   07/07/2018  [name] 09-Dec-1955 [id number]   Follow up: Active with [facility] CM  Patient is for Hospice facility [[facility]]. Will update [facility] active staff of patient's current disposition.  Chart reviewed and reveals the patient is currently transitioning to [facility]. Patient will have her post hospital needs met at the facility.  [facility] Care Management to sign off.   Natividad Brood, RN BSN [facility] Liaison  [phone] business mobile phone Toll free office [phone]

## 2018-07-07 NOTE — Discharge Summary (Signed)
[name], is a 62 y.o. female  DOB [birth year]  MRN [id number].  Admission date:  06/17/2018  Admitting Physician  Reyes Ivan, MD  Discharge Date:  07/07/2018   Primary MD  Madelin Headings, MD  Recommendations for primary care physician for things to follow:  - Management per hospice facility    Admission Diagnosis  SOB   Discharge Diagnosis  SOB    Active Problems:   Atrial fibrillation with rapid ventricular response s/p TEE-DCCV (02/17/14)   Terminal care   Pulmonary embolism (HCC)   Pressure injury of skin   Acute respiratory failure with hypoxia (HCC)   Palliative care by specialist   Retroperitoneal bleed   Increased oropharyngeal secretions      Past Medical History:  Diagnosis Date  . Acute right MCA stroke (HCC) 11/07/10  . Atrial fibrillation (HCC)    a. s/p TEE-DCCV 02/2104; b. Xarelto started  . CAD (coronary artery disease)    a.  cath 09/2010: LAD stent patent, S-Int/dCFX ok, S-PDA ok, L-LAD atretic;  b. Lexiscan Myoview (02/2014):  no ischemia, EF 55%; c. 11/2014 Cath/PCI: LM nl, LAD 20pISR, LCX 80-32m, OM1 nl, RI 70p, RCA 40-56m, RPDA 95ost/95-34m (PTCA only w/ reduction to 50p/72m), 60d, L->LAD atretic, VG->RI->OM nl, VG->PDA 100p.  . Cardiomyopathy with EF 40% at TEE 02/17/14 (likely tachycardia mediated - Myoview 02/19/14 neg for ischemia with normal EF) 02/18/2014  . COPD (chronic obstructive pulmonary disease) (HCC)   . Depression   . Eczema   . GERD (gastroesophageal reflux disease)   . Headache   . HLD (hyperlipidemia)   . Homelessness 11/12/2011  . 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 Hx of CABG   . Hx of transesophageal echocardiography (TEE) for monitoring 11/2010   TEE 11/2010: EF 60-65%, BAE, trivial atrial septal shunt;  right heart cath in 10/2010 with elevated R and L  heart pressures and diuretic started  . Hypertension   . Hypothyroidism   . Persistent atrial fibrillation 10/2014  . Pulmonary nodules    repeat CT due in 11/2011  . Sleep apnea    recent sleep study  in 04/2014 per chart review  shows no significant OSA    Past Surgical History:  Procedure Laterality Date  . BLADDER SURGERY    . CARDIAC CATHETERIZATION  2012  . CARDIOVERSION N/A 02/17/2014   Procedure: CARDIOVERSION;  Surgeon: Pricilla Riffle, MD;  Location: [facility] ENDOSCOPY;  Service: Cardiovascular;  Laterality: N/A;  . CARDIOVERSION N/A 09/20/2016   Procedure: CARDIOVERSION;  Surgeon: Lars Masson, MD;  Location: [facility] ENDOSCOPY;  Service: Cardiovascular;  Laterality: N/A;  . CHEST WALL RECONSTRUCTION    . CORONARY ARTERY BYPASS GRAFT    . debriment for infection in chest    . HERNIA REPAIR    . IR IVC FILTER PLMT / S&I /IMG GUID/MOD SED  07/04/2018  . KNEE  ARTHROSCOPY Bilateral   . LEFT AND RIGHT HEART CATHETERIZATION WITH CORONARY ANGIOGRAM N/A 11/10/2014   Procedure: LEFT AND RIGHT HEART CATHETERIZATION WITH CORONARY ANGIOGRAM;  Surgeon: Marykay Lex, MD;  Location: [facility] CATH LAB;  Service: Cardiovascular;  Laterality: N/A;  . Left mastoidectomy    . TEE WITHOUT CARDIOVERSION N/A 02/17/2014   Procedure: TRANSESOPHAGEAL ECHOCARDIOGRAM (TEE);  Surgeon: Pricilla Riffle, MD;  Location: [facility] ENDOSCOPY;  Service: Cardiovascular;  Laterality: N/A;       History of present illness and  Hospital Course:     Kindly see H&P for history of present illness and admission details, please review complete Labs, Consult reports and Test reports for all details in brief  HPI  from the history and physical done on the day of admission 06/17/2018  62 year old female who resides in SNF after recent ICH with L sided weakness. Now presenting with hypoxia and PE. Started on heparin infusion.     Hospital Course  62 year old female w/ a hx of tobacco abuse, dementia, sleep apnea, systolic CHF, HTN,  Hypothyroidism, CVA, and COPD who resides in a SNF after a recent ICH/ IVHwhile on Xarelto for A. fib resulting in L sided weakness who presented with hypoxia and and was found to have PE.  Total course was complicated by respiratory failure required intubation, she was successfully extubated, had acute blood loss anemia where she required 5 units PRBC transfusion secondary to retro-/extraperitoneal hemorrhage and abdominal wall hematoma most likely in the setting of the anticoagulation, oliguric acute renal failure, patient clinically continued to deteriorate, with overall very poor prognosis, and has been made to transition to comfort care, please see discussion below  Significant Events: 10/9 > Admit Acute PE 10/10 >Speech Evaluation >Mild aspiration Risk >No aspiration Noted  10/11 >Transfer to [facility]  10/13: Increased tachycardia, tachypnea and respiratory distress rapid response called, more agitated, recurrent atrial fibrillation with rapid ventricular response. 10/14: Critical care reconsulted for respiratory distress, intubated 10/20: one way extubation 10/25: CT abdomen pelvis significant for large extraperitoneal/retroperitoneal/abdominal wall bleed. 10/26: IVC filter placement IR  Acute pulmonary embolism - with increased RV/LV ratio - Acute Hypoxic resp failure -off anticoagulation since ICH in July - lytic tx was not indicated -Initially on Lovenox for anticoagulation, then she was transitioned to Eliquis, currently off all forms of anticoagulation secondary to large life-threatening retroperitoneal/extraperitoneal/abdominal wall hematoma . -IVC filter placed by IR 07/04/2018 prevent progression of her PE, in the setting of known left lower extremity DVT. -Patient is comfort care currently  Bilateral lower extremity ischemia -Lower extremity appears to be mottled today, with significantly diminished pulses, and clammy, evidence of gangrene. -This is most likely in the  setting of perfusion status secondary to profound anemia, and hypotension. -Vascular surgery input greatly appreciated, recommendation were for nonoperative management, -comfort care currently.  Extraperitoneal/retroperitoneal abdominal bleed with abdominal wall hematoma -Is most likely in the setting of anticoagulation secondary to acute PE and left lower extremity DVT. -Currently anticoagulation on hold, continue with supportive transfusion, required another 2 units PRBC transfusion overnight, so far 4 units. -She is comfort care currently, no further labs  Oliguric acute renal failure -Most likely in the setting of ATN, secondary to hypotension overnight, appears to be oliguric, unfortunately renal function continues to deteriorate despite appropriate volume resuscitation with IV fluid boluses and blood transfusions, .  Far had 200 cc urine output over last 48 hours and her Foley bag. -Urology input appreciated, for abdominal bleed causing bladder compression,  but no outlet obstruction  Pseudomonas PNA - Completed course of cefepime - afebrile w/ normal WBC   Metabolic encephalopathy - patient with baseline poor mental status secondary to IVH, complicated by long hospital stay, delirium.  Atrial fibrillation with RVR - A. fib with RVR hard to control, especially in the setting of hypotension, now blood pressure has improved, as well heart rate is improved after adding Toprol-XL, so for now continue with Cardizem and Toprol-XL. -Please see above discussion about anticoagulation. . Klebsiella urinary tract infection - tx completed   Anemia -Acute blood loss anemia in the setting of intra-abdominal bleed, continue with supportive care with transfusions, repeat CBC in the afternoon and transfuse if needed, she was transfused 2 units PRBC yesterday.   Constipation: Already on Colace. Responded to MiraLAX  COPD without acute exacerbation Using Xopenex so as not to  exacerbate RVR - no acute exacerbation   CAD with history of CABG  Hyperlipidemia  History of right MCA strokewith residual L sided weakness  IVHJuly 2019 resolved on CT head this admission  Pressure injuries left heel and left elbow WOC suggestions being followed  Goals of care -Unfortunately patient has staring events last July with her IVH, since then she continues to have rapid decline of her clinical condition and life quality with multiple hospitalizations since, currently she is having multiorgan failure, nonambulatory at baseline, with very poor prognosis, difficult amount of discomfort, I have discussed the son, plan is to proceed with comfort care measures today, patient was seen by palliative medicine, and recommendation has been to discharge to hospice facility.  Discharge Condition:   comfort care, hospice facility discharge       Discharge Instructions  and  Discharge Medications    Discharge Instructions    Discharge instructions   Complete by:  As directed    Management per hospice facility     Allergies as of 07/07/2018      Reactions   Avelox [moxifloxacin Hcl In Nacl] Other (See Comments)   Mental breakdown   Amoxicillin Hives   Pamelor [nortriptyline Hcl] Other (See Comments)   Made her want to hurt herself   Penicillins Hives   Has patient had a PCN reaction causing immediate rash, facial/tongue/throat swelling, SOB or lightheadedness with hypotension: Yes Has patient had a PCN reaction causing severe rash involving mucus membranes or skin necrosis: Unk Has patient had a PCN reaction that required hospitalization: Unk Has patient had a PCN reaction occurring within the last 10 years: Unk If all of the above answers are "NO", then may proceed with Cephalosporin use.   Other Itching   Any med that ends in -caine   Hydrocodone Itching   Oxycodone Itching   Sulfa Antibiotics Other (See Comments)   "Allergic," per [facility]      Medication  List    STOP taking these medications   atorvastatin 40 MG tablet Commonly known as:  LIPITOR   cetirizine 10 MG tablet Commonly known as:  ZYRTEC   dicyclomine 20 MG tablet Commonly known as:  BENTYL   diphenoxylate-atropine 2.5-0.025 MG tablet Commonly known as:  LOMOTIL   ENSURE   FLUoxetine 20 MG capsule Commonly known as:  PROZAC   gabapentin 300 MG capsule Commonly known as:  NEURONTIN   lisinopril 10 MG tablet Commonly known as:  PRINIVIL,ZESTRIL   ondansetron 4 MG tablet Commonly known as:  ZOFRAN   pantoprazole 40 MG tablet Commonly known as:  PROTONIX     TAKE these medications  acetaminophen 325 MG tablet Commonly known as:  TYLENOL Take 2 tablets (650 mg total) by mouth every 6 (six) hours as needed for mild pain (or Fever >/= 101).   fentaNYL 100 MCG/2ML injection Commonly known as:  SUBLIMAZE Inject 0.5 mLs (25 mcg total) into the vein every 2 (two) hours as needed for severe pain (dyspnea/air hunger).   haloperidol 0.5 MG tablet Commonly known as:  HALDOL Take 1 tablet (0.5 mg total) by mouth every 4 (four) hours as needed for agitation (or delirium).   LORazepam 1 MG tablet Commonly known as:  ATIVAN Take 1 tablet (1 mg total) by mouth every 4 (four) hours as needed for anxiety.   ondansetron 4 MG disintegrating tablet Commonly known as:  ZOFRAN-ODT Take 1 tablet (4 mg total) by mouth every 6 (six) hours as needed for nausea.   scopolamine 1 MG/3DAYS Commonly known as:  TRANSDERM-SCOP Place 1 patch (1.5 mg total) onto the skin every 3 (three) days. Start taking on:  07/10/2018         Diet and Activity recommendation: See Discharge Instructions above   Consults obtained -   PCCM, urology,vascular ,IR,palliative  Major procedures and Radiology Reports - PLEASE review detailed and final reports for all details, in brief -      Ct Abdomen Pelvis Wo Contrast  Result Date: 07/03/2018 CLINICAL DATA:  Suprapubic mass and pain.  EXAM: CT ABDOMEN AND PELVIS WITHOUT CONTRAST TECHNIQUE: Multidetector CT imaging of the abdomen and pelvis was performed following the standard protocol without IV contrast. COMPARISON:  CT scan from 2012. FINDINGS: Lower chest: The lung bases are grossly clear. Moderate breathing motion artifact but no definite infiltrates or effusions. Streaky bibasilar atelectasis. Diastasis of the chest wall possibly related to prior trauma or median sternotomy. Hepatobiliary: No focal hepatic lesions or intrahepatic biliary dilatation. Layering gallstones noted the gallbladder. No common bile duct dilatation. Pancreas: No mass, inflammation or ductal dilatation. Moderate pancreatic atrophy. Spleen: Normal size.  No focal lesions. Adrenals/Urinary Tract: Mildly thickened adrenal glands bilaterally without discrete lesions. No renal calculi or mass. No ureteral calculi. The bladder is being compressed by a large left-sided extra peritoneal pelvic hematoma also involving the abdominal retroperitoneum. This measures approximately 11.3 x 11.7 x 8.5 cm. There is a Foley catheter in the bladder and small amount of air. Stomach/Bowel: Grossly normal. No inflammatory changes, mass lesions or obstructive findings. Small duodenal lipoma noted incidentally. Vascular/Lymphatic: Advanced atherosclerotic calcifications involving the aorta and iliac arteries and branch vessels. No mesenteric or retroperitoneal mass or adenopathy. Reproductive: Grossly normal. Other: Large left-sided retroperitoneal and extraperitoneal pelvic hematoma as detailed above. There is also a small hematoma in the anterior abdominal wall on the right side. There is also a small left rectus muscle hematoma. Musculoskeletal: No significant bony findings. IMPRESSION: 1. Large left-sided extraperitoneal pelvic hematoma also in the lower abdominal retroperitoneum. Moderate mass effect on the bladder. 2. Small hematoma in the left rectus muscle and also in the right lower  subcutaneous abdominal wall. 3. No intra-abdominal/intrapelvic mass or adenopathy. 4. Foley catheter in good position without complicating features. These results will be called to the ordering clinician or representative by the Radiologist Assistant, and communication documented in the PACS or zVision Dashboard. Electronically Signed   By: Rudie Meyer M.D.   On: 07/03/2018 15:38   Dg Abd 1 View - Kub  Result Date: 06/22/2018 CLINICAL DATA:  Orogastric tube placement. EXAM: ABDOMEN - 1 VIEW COMPARISON:  None. FINDINGS: The bowel gas pattern  is normal. No radio-opaque calculi or other significant radiographic abnormality are seen. Distal tip of orogastric tube seen in expected position of proximal stomach. IMPRESSION: Distal tip of orogastric tube seen in expected position of proximal stomach. No evidence of bowel obstruction or ileus. Electronically Signed   By: Lupita Raider, M.D.   On: 06/22/2018 12:15   Ct Head Wo Contrast  Result Date: 06/21/2018 CLINICAL DATA:  Altered mental status. EXAM: CT HEAD WITHOUT CONTRAST TECHNIQUE: Contiguous axial images were obtained from the base of the skull through the vertex without intravenous contrast. COMPARISON:  CT head dated June 17, 2018. FINDINGS: Brain: No evidence of acute infarction, hemorrhage, hydrocephalus, extra-axial collection or mass lesion/mass effect. Unchanged chronic right MCA territory encephalomalacia and ex vacuo dilatation of the right lateral ventricle. Vascular: Atherosclerotic vascular calcification of the carotid siphons. No hyperdense vessel. Skull: Negative. Sinuses/Orbits: No acute finding. Unchanged small right mastoid effusion. Prior left mastoidectomy. Other: None. IMPRESSION: 1.  No acute intracranial abnormality. 2. Chronic right MCA infarct. Electronically Signed   By: Obie Dredge M.D.   On: 06/21/2018 12:38   Ct Head Wo Contrast  Result Date: 06/17/2018 CLINICAL DATA:  62 year old female status post intraventricular  hemorrhage in July. Now presenting with acute PE. Starting on heparin. EXAM: CT HEAD WITHOUT CONTRAST TECHNIQUE: Contiguous axial images were obtained from the base of the skull through the vertex without intravenous contrast. COMPARISON:  Head CTs 04/15/2018 and earlier. FINDINGS: Brain: Completely resolved intraventricular hemorrhage since July. Ex vacuo right lateral ventricle enlargement is related to the chronic right MCA territory encephalomalacia. No ventriculomegaly or transependymal edema. Stable gray-white matter differentiation throughout the brain. No midline shift, ventriculomegaly, mass effect, evidence of mass lesion, intracranial hemorrhage or evidence of cortically based acute infarction. Vascular: Mild Calcified atherosclerosis at the skull base. There is a degree of residual intravascular contrast from the chest CTA earlier today. Skull: Negative. Sinuses/Orbits: Improved paranasal sinus aeration throughout. Chronic left mastoidectomy. Stable mild right mastoid effusion. Other: No acute orbit or scalp soft tissue findings. IMPRESSION: 1. No acute intracranial abnormality. 2. Completely resolved intraventricular hemorrhage since July. 3. Chronic right MCA infarct. Electronically Signed   By: Odessa Fleming M.D.   On: 06/17/2018 21:02   Ct Angio Chest Pe W And/or Wo Contrast  Result Date: 06/17/2018 CLINICAL DATA:  Cough and abnormal breath sounds with hypoxia EXAM: CT ANGIOGRAPHY CHEST WITH CONTRAST TECHNIQUE: Multidetector CT imaging of the chest was performed using the standard protocol during bolus administration of intravenous contrast. Multiplanar CT image reconstructions and MIPs were obtained to evaluate the vascular anatomy. CONTRAST:  70 mL [id number] COMPARISON:  04/03/2011 FINDINGS: Cardiovascular: Thoracic aorta demonstrates atherosclerotic calcifications as well as changes of prior coronary bypass grafting. Mild cardiac enlargement is seen. Coronary calcifications are noted. The aorta is  incompletely opacified precluding adequate evaluation. The pulmonary artery shows a normal branching pattern bilaterally. The left pulmonary artery and its branches are within normal limits. On the right however there is a large clot within the right main pulmonary artery and extending into the upper and lower lobe branches. The RV LV ratio is greater than 1 consistent with a degree of right heart strain. Mediastinum/Nodes: Thoracic inlet is within normal limits. No significant hilar or mediastinal adenopathy is noted. The esophagus as visualized is within normal limits. Lungs/Pleura: Lungs are well aerated bilaterally with emphysematous changes. Small bilateral pleural effusions and dependent atelectatic changes are seen. No findings to suggest pulmonary infarct are noted at this time. Mild  alveolar edema is noted particularly in the right upper lobe. No focal confluent infiltrate is seen. Upper Abdomen: Cholelithiasis is noted without complicating factors. No other focal abnormality is noted. Musculoskeletal: Degenerative changes of the thoracic spine are seen. Chronic changes at the costosternal junction on the right is seen. Some mild herniation of abdominal fat is noted inferiorly below the xiphoid stable in appearance from a prior exam from 2012. Review of the MIP images confirms the above findings. IMPRESSION: Positive for acute PE primarily within the right pulmonary arterial system with CT evidence of right heart strain (RV/LV Ratio is greater than 1) consistent with at least submassive (intermediate risk) PE. The presence of right heart strain has been associated with an increased risk of morbidity and mortality. Please activate Code PE by paging [phone]. Mild alveolar edema is noted particularly in the right upper lobe. Small effusions and bibasilar dependent atelectatic changes. Critical Value/emergent results were called by telephone at the time of interpretation on 06/17/2018 at 6:24 pm to Dr. Virgina Norfolk , who verbally acknowledged these results. Aortic Atherosclerosis (ICD10-I70.0) and Emphysema (ICD10-J43.9). Electronically Signed   By: Alcide Clever M.D.   On: 06/17/2018 18:28   Ir Ivc Filter Plmt / S&i /img Guid/mod Sed  Result Date: 07/04/2018 INDICATION: DVT and pulmonary embolism. Patient recently started on anti coagulation however has subsequently developed a symptomatic retroperitoneal hematoma. As such, request made for placement of an IVC filter for the purposes of caval interruption. Given the patient's multiple medical comorbidities, the IVC filter will be considered a permanent device. EXAM: ULTRASOUND GUIDANCE FOR VASCULAR ACCESS IVC CATHETERIZATION AND VENOGRAM IVC FILTER INSERTION COMPARISON:  None. MEDICATIONS: None. ANESTHESIA/SEDATION: Fentanyl 25 mcg IV; Versed 0.5 mg IV Sedation Time: 16 minutes; The patient was continuously monitored during the procedure by the interventional radiology nurse under my direct supervision. CONTRAST:  None, CO2 was utilized for this examination. FLUOROSCOPY TIME:  1 minute, 30 seconds (9 mGy) COMPLICATIONS: None immediate PROCEDURE: Informed consent was obtained from the patient following explanation of the procedure, risks, benefits and alternatives. The patient understands, agrees and consents for the procedure. All questions were addressed. A time out was performed prior to the initiation of the procedure. Maximal barrier sterile technique utilized including caps, mask, sterile gowns, sterile gloves, large sterile drape, hand hygiene, and Betadine prep. Under sterile condition and local anesthesia, right internal jugular venous access was performed with ultrasound. An ultrasound image was saved and sent to PACS. Over a guidewire, the IVC filter delivery sheath and inner dilator were advanced into the IVC just above the IVC bifurcation. CO2 injection was performed for an IVC venogram. Through the delivery sheath, a retrievable Denali IVC filter was  deployed below the level of the renal veins and above the IVC bifurcation. Limited post deployment venacavagram was performed. The delivery sheath was removed and hemostasis was obtained with manual compression. A dressing was placed. The patient tolerated the procedure well without immediate post procedural complication. FINDINGS: The IVC is patent. CO2 venogram is limited secondary to patient's inability to cooperate with the imaging however there is no definitive evidence of thrombus, stenosis, or occlusion. No variant venous anatomy. Successful placement of the IVC filter below the level of the renal veins. Above also supported by imaging findings demonstrated on preceding noncontrast abdominal CT. IMPRESSION: Successful ultrasound and fluoroscopically guided placement of an infrarenal retrievable IVC filter via right jugular approach. PLAN: Due to patient related comorbidities and/or clinical necessity, this IVC filter should be considered a  permanent device. This patient will not be actively followed for future filter retrieval. Electronically Signed   By: Simonne Come M.D.   On: 07/04/2018 15:48   Dg Chest Port 1 View  Result Date: 07/03/2018 CLINICAL DATA:  Shortness of Breath EXAM: PORTABLE CHEST 1 VIEW COMPARISON:  June 28, 2018 FINDINGS: Endotracheal tube and nasogastric tube have been removed. Central catheter tip is in the superior vena cava. No pneumothorax. There is no evident edema or consolidation. Heart size and pulmonary vascularity are normal. Heart is upper normal in size with pulmonary vascularity normal. Patient is status post coronary artery bypass grafting. No bone lesions. Postoperative changes are noted in the right upper thoracic region. IMPRESSION: No edema or consolidation. Stable cardiac silhouette. Central catheter tip in superior vena cava. No pneumothorax. Electronically Signed   By: Bretta Bang III M.D.   On: 07/03/2018 22:16   Dg Chest Port 1 View  Result Date:  06/28/2018 CLINICAL DATA:  Acute respiratory failure. EXAM: PORTABLE CHEST 1 VIEW COMPARISON:  One-view chest x-ray 06/27/2018 FINDINGS: Heart is enlarged. Endotracheal tube terminates 3.8 cm above the carina. A right side PICC line is stable. Side port of the NG tube is just beyond the GE junction. Aeration of both lungs is slightly improved. Small effusions and bibasilar atelectasis remain. IMPRESSION: 1. Improving aeration of both lungs. 2. Residual small bilateral pleural effusions and associated atelectasis. 3. Support apparatus stable. Electronically Signed   By: Marin Roberts M.D.   On: 06/28/2018 10:06   Dg Chest Port 1 View  Result Date: 06/27/2018 CLINICAL DATA:  Acute respiratory failure. EXAM: PORTABLE CHEST 1 VIEW COMPARISON:  One-view chest x-ray 06/26/2018 FINDINGS: Endotracheal tube terminates 2.5 cm above the carina. NG tube courses off the inferior border the film. A right-sided PICC line is in place. Heart is enlarged. CABG is noted. Interstitial edema is slightly improved. Bilateral pleural effusions remain. Bibasilar airspace disease likely reflects atelectasis. IMPRESSION: 1. Support apparatus stable and in satisfactory position. 2. Slight decrease in interstitial edema. 3. Cardiomegaly with persistent bilateral effusions and basilar airspace disease, likely atelectasis. Findings are consistent with congestive heart failure. Electronically Signed   By: Marin Roberts M.D.   On: 06/27/2018 07:28   Dg Chest Port 1 View  Result Date: 06/26/2018 CLINICAL DATA:  Acute respiratory failure EXAM: PORTABLE CHEST 1 VIEW COMPARISON:  06/25/2018 FINDINGS: Cardiac shadow remains enlarged. Postsurgical changes are again seen. The endotracheal tube, right-sided PICC line and nasogastric catheter are again seen and stable. Bilateral pleural effusions are noted. Left retrocardiac consolidation is seen. IMPRESSION: Bilateral pleural effusions with left basilar consolidation. Tubes and  lines as described. Electronically Signed   By: Alcide Clever M.D.   On: 06/26/2018 07:15   Dg Chest Port 1 View  Result Date: 06/25/2018 CLINICAL DATA:  Acute respiratory failure. EXAM: PORTABLE CHEST 1 VIEW COMPARISON:  06/24/2018 FINDINGS: Endotracheal tube is 2.0 cm above the carina. PICC line tip in the lower SVC region. Nasogastric tube extends into the abdomen. Persistent basilar chest densities are most compatible with pleural fluid and volume loss. Negative for a pneumothorax. Stable hazy densities in the medial right upper lung. Heart size is upper limits of normal but stable. IMPRESSION: 1. Stable position of the support apparatuses. 2. No significant change in the lung disease. Persistent basilar chest densities are most compatible with pleural fluid and atelectasis/consolidation. Electronically Signed   By: Richarda Overlie M.D.   On: 06/25/2018 09:11   Dg Chest [facility]  Result Date: 06/24/2018 CLINICAL DATA:  Insertion of central venous catheter, history of atrial fibrillation and cardiomyopathy, prior CABG with eventual debridement for infection EXAM: PORTABLE CHEST 1 VIEW COMPARISON:  Chest x-ray of 06/24/2018 and CT chest of 06/17/2017 FINDINGS: The tip of the endotracheal tube is very near the carina, less than 1 cm above the carina and withdrawing by 2 cm is recommended. Right central venous line tip is seen to the mid low SVC. No pneumothorax is seen. Cardiomegaly and mild pulmonary vascular congestion is present with probable small effusions left-greater-than-right. NG tube extends below the hemidiaphragm. IMPRESSION: 1. Right central venous line tip seen to the lower SVC. No pneumothorax. 2. Cardiomegaly with mild CHF and small effusions, left greater than right. 3. Endotracheal tube tip very near the carina. Recommend retracting by approximately 2 cm. Electronically Signed   By: Dwyane Dee M.D.   On: 06/24/2018 12:04   Dg Chest Port 1 View  Result Date: 06/24/2018 CLINICAL DATA:   Patient admitted 06/17/2018 with hypoxia and pulmonary embolus. Intubated. EXAM: PORTABLE CHEST 1 VIEW COMPARISON:  Single-view of the chest 06/23/2018 and 06/22/2018. FINDINGS: The patient's neck appears to be in flexion. Endotracheal tube is 0.8 cm above the carina. NG tube courses into the stomach and below the inferior margin the film. Left greater than right pleural effusions and basilar airspace disease appear similar to yesterday's exam. Heart size is enlarged. No pneumothorax. Surgical clips right axilla noted. IMPRESSION: No notable change in left greater than right pleural effusions and basilar airspace disease, likely atelectasis. Endotracheal tube tip is 0.8 cm above the carina which may be secondary to the patient's neck being in flexion. Electronically Signed   By: Drusilla Kanner M.D.   On: 06/24/2018 10:05   Portable Chest Xray  Result Date: 06/23/2018 CLINICAL DATA:  Shortness of breath. EXAM: PORTABLE CHEST 1 VIEW COMPARISON:  Radiograph of June 22, 2018. FINDINGS: Stable cardiomegaly. Endotracheal and nasogastric tubes are unchanged in position. Status post coronary artery bypass graft. Increased central pulmonary vascular congestion and bilateral pulmonary edema is noted. Mild to moderate left pleural effusion is noted with associated atelectasis. No pneumothorax is noted. Bony thorax is unremarkable. IMPRESSION: Increased central pulmonary vascular congestion and bilateral pulmonary edema, with mild to moderate left pleural effusion with associated atelectasis. Stable support apparatus. Electronically Signed   By: Lupita Raider, M.D.   On: 06/23/2018 08:42   Dg Chest Port 1 View  Result Date: 06/22/2018 CLINICAL DATA:  Endotracheal tube placement. EXAM: PORTABLE CHEST 1 VIEW COMPARISON:  Radiograph of same day. FINDINGS: Stable cardiomegaly. Status post coronary artery bypass graft. Endotracheal tube is projected over tracheal air shadow with distal tip 2 cm above the carina.  Nasogastric tube is seen entering stomach. No pneumothorax is noted. Mild bibasilar subsegmental atelectasis is noted with probable small pleural effusions. Bony thorax is unremarkable. Right axillary surgical clips are noted. IMPRESSION: Endotracheal tube in grossly good position. Interval placement of nasogastric tube. Probable mild bibasilar subsegmental atelectasis with small pleural effusions. Electronically Signed   By: Lupita Raider, M.D.   On: 06/22/2018 12:14   Dg Chest Port 1 View  Result Date: 06/22/2018 CLINICAL DATA:  Shortness of breath. Aspiration pneumonia. Recent pulmonary emboli. EXAM: PORTABLE CHEST 1 VIEW COMPARISON:  Chest x-rays dated 06/21/2018 and 06/17/2018 and chest CT dated 06/17/2018. FINDINGS: There is new slight hazy perihilar pulmonary vascular congestion bilaterally. Small bilateral effusions, less prominent on right. Overall heart size is normal. CABG. No acute bone  abnormality. IMPRESSION: New slight pulmonary vascular congestion. Small bilateral effusions, diminished on the right. Electronically Signed   By: Francene Boyers M.D.   On: 06/22/2018 10:23   Dg Chest Port 1 View  Result Date: 06/21/2018 CLINICAL DATA:  Patient with acute respiratory distress. EXAM: PORTABLE CHEST 1 VIEW COMPARISON:  Chest radiograph 06/17/2018 FINDINGS: Monitoring leads overlie the patient. Stable cardiomegaly. New moderate left pleural effusion with underlying opacities. Minimal right basilar heterogeneous opacities. IMPRESSION: Cardiomegaly. New moderate left effusion with underlying opacities which may represent atelectasis or infection. Heterogeneous opacities right lung base may represent atelectasis. Electronically Signed   By: Annia Belt M.D.   On: 06/21/2018 20:33   Dg Chest Portable 1 View  Result Date: 06/17/2018 CLINICAL DATA:  Cough and rhonchi. EXAM: PORTABLE CHEST 1 VIEW COMPARISON:  05/05/2018 FINDINGS: Stable cardiomegaly with post CABG change. Nonaneurysmal slightly  atherosclerotic aorta. No overt pulmonary edema, consolidation, effusion or pneumothorax. Mild chronic interstitial prominence redemonstrated of the lungs. No acute osseous abnormality. IMPRESSION: Stable cardiomegaly with minimal aortic atherosclerosis and post CABG change. No active pulmonary disease. Electronically Signed   By: Tollie Eth M.D.   On: 06/17/2018 16:01   Dg Swallowing Func-speech Pathology  Result Date: 06/29/2018 Objective Swallowing Evaluation: Type of Study: MBS-Modified Barium Swallow Study  Patient Details Name: [name] MRN: [id number] Date of Birth: 1956/01/08 Today's Date: 06/29/2018 Time: SLP Start Time (ACUTE ONLY): 1211 -SLP Stop Time (ACUTE ONLY): 1232 SLP Time Calculation (min) (ACUTE ONLY): 21 min Past Medical History: Past Medical History: Diagnosis Date . Acute right MCA stroke (HCC) 11/07/10 . Atrial fibrillation (HCC)   a. s/p TEE-DCCV 02/2104; b. Xarelto started . CAD (coronary artery disease)   a.  cath 09/2010: LAD stent patent, S-Int/dCFX ok, S-PDA ok, L-LAD atretic;  b. Lexiscan Myoview (02/2014):  no ischemia, EF 55%; c. 11/2014 Cath/PCI: LM nl, LAD 20pISR, LCX 80-39m, OM1 nl, RI 70p, RCA 40-95m, RPDA 95ost/95-18m (PTCA only w/ reduction to 50p/93m), 60d, L->LAD atretic, VG->RI->OM nl, VG->PDA 100p. . Cardiomyopathy with EF 40% at TEE 02/17/14 (likely tachycardia mediated - Myoview 02/19/14 neg for ischemia with normal EF) 02/18/2014 . COPD (chronic obstructive pulmonary disease) (HCC)  . Depression  . Eczema  . GERD (gastroesophageal reflux disease)  . Headache  . HLD (hyperlipidemia)  . Homelessness 11/12/2011 . 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 Hx MRSA infection   Chest wall syndrome post CABG . Hx of CABG  . Hx of transesophageal echocardiography (TEE) for monitoring 11/2010  TEE 11/2010: EF 60-65%, BAE, trivial atrial septal shunt;  right heart cath in 10/2010 with elevated R and L heart pressures and diuretic started . Hypertension  .  Hypothyroidism  . Persistent atrial fibrillation 10/2014 . Pulmonary nodules   repeat CT due in 11/2011 . Sleep apnea   recent sleep study  in 04/2014 per chart review  shows no significant OSA Past Surgical History: Past Surgical History: Procedure Laterality Date . BLADDER SURGERY   . CARDIAC CATHETERIZATION  2012 . CARDIOVERSION N/A 02/17/2014  Procedure: CARDIOVERSION;  Surgeon: Pricilla Riffle, MD;  Location: [facility] ENDOSCOPY;  Service: Cardiovascular;  Laterality: N/A; . CARDIOVERSION N/A 09/20/2016  Procedure: CARDIOVERSION;  Surgeon: Lars Masson, MD;  Location: [facility] ENDOSCOPY;  Service: Cardiovascular;  Laterality: N/A; . CHEST WALL RECONSTRUCTION   . CORONARY ARTERY BYPASS GRAFT   . debriment for infection in chest   . HERNIA REPAIR   . KNEE ARTHROSCOPY Bilateral  .  LEFT AND RIGHT HEART CATHETERIZATION WITH CORONARY ANGIOGRAM N/A 11/10/2014  Procedure: LEFT AND RIGHT HEART CATHETERIZATION WITH CORONARY ANGIOGRAM;  Surgeon: Marykay Lex, MD;  Location: [facility] CATH LAB;  Service: Cardiovascular;  Laterality: N/A; . Left mastoidectomy   . 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 Now presenting with hypoxia and PE. Started on heparin infusion. No h/o dysphagia documented but pt reports a h/o needing thickened liquids. BSE completed this admission on 10/10 with slight cough/throat clear not directly indicative of decreased airway protection. Regular diet/thin liquids recommended; SLP f/u x1 and s/o. On 10/14 pt became progressively lethargic with tachypnea and hypoxia; she was transferred back to ICU and intubated 10/14; one-way extubation 10/20.  Subjective: pt quicker to respond than she was this morning Assessment / Plan / Recommendation CHL IP CLINICAL IMPRESSIONS  06/29/2018 Clinical Impression Pt presents with a moderate oral and mild pharyngeal dysphagia. Her mentation is brighter than it was even this morning, with quicker responses and more prolonged utterances. She has oral holding of all textures, requiringMax faded to Mod verbal cueing to initiated posterior transit. She has piecemeal swallowing, consistently swallowing boluses in two parts, and needing consistent cueing to swallow both parts. Across the study, her timing did begin to improve with cueing. Her pharyngeal phase demonstrated good timing and and strength across consistencies, although she did have a single episode of premature spillage into the airway while consuming thin liquids via straw. When she did aspirate, it was sensed, triggering a cough response with only a second or two of delay. Recommend starting with Dys 1 diet and thin liquids via cup. Prognosis is good for advancement with additional time post-extubation, clearing of mental status, and reintroduction of PO diet. SLP will f/u for tolerance and ready to advance as swallow becomes swifter. SLP Visit Diagnosis Dysphagia, oropharyngeal phase (R13.12) Attention and concentration deficit following -- Frontal lobe and executive function deficit following -- Impact on safety and function Mild aspiration risk   CHL IP TREATMENT RECOMMENDATION 06/29/2018 Treatment Recommendations Therapy as outlined in treatment plan below   Prognosis 06/29/2018 Prognosis for Safe Diet Advancement Good Barriers to Reach Goals -- Barriers/Prognosis Comment -- CHL IP DIET RECOMMENDATION 06/29/2018 SLP Diet Recommendations Dysphagia 1 (Puree) solids;Thin liquid Liquid Administration via Cup;No straw Medication Administration Whole meds with puree Compensations Minimize environmental distractions;Slow rate;Small sips/bites Postural Changes Seated upright at 90 degrees   CHL IP OTHER RECOMMENDATIONS 06/29/2018 Recommended Consults -- Oral Care Recommendations Oral care BID  Other Recommendations --   CHL IP FOLLOW UP RECOMMENDATIONS 06/29/2018 Follow up Recommendations Skilled Nursing facility   [facility] IP FREQUENCY AND DURATION 06/29/2018 Speech Therapy Frequency (ACUTE ONLY) min 2x/week Treatment Duration 2 weeks      CHL IP ORAL PHASE 06/29/2018 Oral Phase Impaired Oral - Pudding Teaspoon -- Oral - Pudding Cup -- Oral - Honey Teaspoon -- Oral - Honey Cup -- Oral - Nectar Teaspoon -- Oral - Nectar Cup -- Oral - Nectar Straw -- Oral - Thin Teaspoon Holding of bolus;Piecemeal swallowing Oral - Thin Cup Holding of bolus;Piecemeal swallowing Oral - Thin Straw Holding of bolus;Piecemeal swallowing;Premature spillage Oral - Puree Holding of bolus;Piecemeal swallowing Oral - Mech Soft Holding of bolus;Piecemeal swallowing Oral - Regular -- Oral - Multi-Consistency -- Oral -  Pill Holding of bolus Oral Phase - Comment --  CHL IP PHARYNGEAL PHASE 06/29/2018 Pharyngeal Phase Impaired Pharyngeal- Pudding Teaspoon -- Pharyngeal -- Pharyngeal- Pudding Cup -- Pharyngeal -- Pharyngeal- Honey Teaspoon -- Pharyngeal -- Pharyngeal- Honey Cup -- Pharyngeal -- Pharyngeal- Nectar Teaspoon -- Pharyngeal -- Pharyngeal- Nectar Cup -- Pharyngeal -- Pharyngeal- Nectar Straw -- Pharyngeal -- Pharyngeal- Thin Teaspoon WFL Pharyngeal -- Pharyngeal- Thin Cup WFL Pharyngeal -- Pharyngeal- Thin Straw Penetration/Aspiration before swallow Pharyngeal Material enters airway, passes BELOW cords and not ejected out despite cough attempt by patient;Material enters airway, passes BELOW cords then ejected out Pharyngeal- Puree WFL Pharyngeal -- Pharyngeal- Mechanical Soft WFL Pharyngeal -- Pharyngeal- Regular -- Pharyngeal -- Pharyngeal- Multi-consistency -- Pharyngeal -- Pharyngeal- Pill WFL Pharyngeal -- Pharyngeal Comment --  CHL IP CERVICAL ESOPHAGEAL PHASE 06/29/2018 Cervical Esophageal Phase WFL Pudding Teaspoon -- Pudding Cup -- Honey Teaspoon -- Honey Cup -- Nectar Teaspoon -- Nectar Cup -- Nectar Straw -- Thin Teaspoon  -- Thin Cup -- Thin Straw -- Puree -- Mechanical Soft -- Regular -- Multi-consistency -- Pill -- Cervical Esophageal Comment -- Maxcine Ham 06/29/2018, 1:24 PM Maxcine Ham, M.A. CCC-SLP Acute Rehabilitation Services Pager [phone] Office [phone]              [place] Ekg Site Rite  Result Date: 06/23/2018 [facility] image not attached, placement could not be confirmed due to current cardiac rhythm.   Micro Results     No results found for this or any previous visit (from the past 240 hour(s)).     Today   Subjective:   [name] today lethargic, cannot provide any complaints, no significant events overnight per staff Objective:   Blood pressure 99/69, pulse 75, temperature 98.1 F (36.7 C), temperature source Axillary, resp. rate 20, height 5\' 3"  (1.6 m), weight 67.4 kg, SpO2 99 %.   Intake/Output Summary (Last 24 hours) at 07/07/2018 1155 Last data filed at 07/07/2018 0400 Gross per 24 hour  Intake 0 ml  Output 525 ml  Net -525 ml    Exam Lethargic, appears comfortable, noncommunicative,  Symmetrical Chest wall movement, Minister entry, with upper airway audible secretions RRR,No Gallops,Rubs or new Murmurs, No Parasternal Heave +ve B.Sounds, Abd Soft, Non tender,  Mildly Mottled, no edema  Data Review   CBC w Diff:  Lab Results  Component Value Date   WBC 15.2 (H) 07/05/2018   HGB 10.0 (L) 07/05/2018   HGB 12.8 09/13/2016   HCT 29.7 (L) 07/05/2018   HCT 37.9 09/13/2016   PLT 213 07/05/2018   PLT 201 09/13/2016   LYMPHOPCT 15 06/17/2018   MONOPCT 7 06/17/2018   EOSPCT 4 06/17/2018   BASOPCT 1 06/17/2018    CMP:  Lab Results  Component Value Date   NA 142 07/05/2018   NA 144 03/27/2017   K 5.2 (H) 07/05/2018   CL 105 07/05/2018   CO2 22 07/05/2018   BUN 33 (H) 07/05/2018   BUN 22 03/27/2017   CREATININE 2.64 (H) 07/05/2018   CREATININE 1.07 03/31/2015   PROT 5.5 (L) 07/01/2018   PROT 6.3 09/13/2016   ALBUMIN 2.4 (L)  07/01/2018   ALBUMIN 3.9 09/13/2016   BILITOT 0.6 07/01/2018   BILITOT 0.2 09/13/2016   ALKPHOS 53 07/01/2018   AST 19 07/01/2018   ALT 10 07/01/2018  .   Total Time in preparing paper work, data evaluation and todays exam - 30 minutes  Huey Bienenstock M.D on 07/07/2018 at 11:55 AM  [facility]  Office  [phone]

## 2018-07-07 NOTE — Clinical Social Work Note (Signed)
Family has decided on [facility]. Referral was made to [facility] with [facility]. [facility] does have beds available today. Carley Hammed will follow up with CSW.  [place], [place] [phone]

## 2018-07-07 NOTE — Discharge Instructions (Signed)
Management per hospice facility 

## 2018-07-07 NOTE — Patient Outreach (Signed)
[facility]) Care Management  07/07/2018  OFELIA ASQUITH 08-16-1956 [id number]  [facility] CSW received update from [facility] Liaison that family has decided on [facility] and patient will likely go there today. Patient will transition to Hospice Care at this time. [facility] CSW will complete case closure and will sign off.  Dickie La, BSW, MSW, LCSW [facility] [email or web address] Phone: [phone] Fax: [phone]

## 2018-07-07 NOTE — Progress Notes (Signed)
Daily Progress Note   Patient Name: [name]       Date: 07/07/2018 DOB: May 13, 1956  Age: 62 y.o. MRN#: [id number] Attending Physician: Albertine Patricia, MD Primary Care Physician: Burnis Medin, MD Admit Date: 06/17/2018  Reason for Consultation/Follow-up: Establishing goals of care and Terminal Care  Subjective: Patient lethargic but appears comfortable. Audible secretions.   Spoke with son, Mitzi Hansen via telephone. He will be at the hospital around 12:30 this afternoon. Updated on current status and initiation of scopolamine patch for secretions. Explained that vitals are stable and she could transfer to hospice facility today if bed becomes available. Answered all questions. Mitzi Hansen has PMT contact information.   Length of Stay: 20  Current Medications: Scheduled Meds:  . chlorhexidine gluconate (MEDLINE KIT)  15 mL Mouth Rinse BID  . furosemide  20 mg Intravenous Once  . mouth rinse  15 mL Mouth Rinse q12n4p  . povidone-iodine   Topical BID  . scopolamine  1 patch Transdermal Q72H    Continuous Infusions:  PRN Meds: acetaminophen **OR** acetaminophen, fentaNYL (SUBLIMAZE) injection, glycopyrrolate, haloperidol **OR** haloperidol **OR** haloperidol lactate, levalbuterol, LORazepam **OR** LORazepam **OR** LORazepam, metoprolol tartrate, ondansetron **OR** ondansetron (ZOFRAN) IV, sodium chloride flush  Physical Exam  Constitutional: She appears lethargic. She appears cachectic. She appears ill.  HENT:  Head: Normocephalic and atraumatic.  Cardiovascular: Regular rhythm.  Pulmonary/Chest: No accessory muscle usage. No tachypnea. No respiratory distress. She has rhonchi.  Audible secretions  Abdominal: There is no tenderness.  Neurological: She appears lethargic.  Skin:  Skin is warm and dry. There is pallor.  BLE mottling  Nursing note and vitals reviewed.          Vital Signs: BP 99/69 (BP Location: Left Arm)   Pulse 75   Temp 98.1 F (36.7 C) (Axillary)   Resp 20   Ht _0  (1.6 m)   Wt 67.4 kg   SpO2 99%   BMI 26.32 kg/m  SpO2: SpO2: 99 % O2 Device: O2 Device: High Flow Nasal Cannula O2 Flow Rate: O2 Flow Rate (L/min): 5 L/min  Intake/output summary:   Intake/Output Summary (Last 24 hours) at 07/07/2018 1010 Last data filed at 07/07/2018 0400 Gross per 24 hour  Intake 0 ml  Output 525 ml  Net -525 ml   LBM: Last BM Date:  07/03/18 Baseline Weight: Weight: 68 kg Most recent weight: Weight: 67.4 kg       Palliative Assessment/Data: PPS 10%   Flowsheet Rows     Most Recent Value  Intake Tab  Referral Department  Hospitalist  Unit at Time of Referral  Med/Surg Unit  Palliative Care Primary Diagnosis  -- [multi-organ failure]  Palliative Care Type  New Palliative care  Reason for referral  Clarify Goals of Care  Date first seen by Palliative Care  07/06/18  Clinical Assessment  Palliative Performance Scale Score  20%  Psychosocial & Spiritual Assessment  Palliative Care Outcomes  Patient/Family meeting held?  Yes  Who was at the meeting?  son, niece  Palliative Care Outcomes  Clarified goals of care, Improved pain interventions, Improved non-pain symptom therapy, Counseled regarding hospice, Provided end of life care assistance, Transitioned to hospice, ACP counseling assistance, Provided psychosocial or spiritual support, Changed to focus on comfort      Patient Active Problem List   Diagnosis Date Noted  . Palliative care by specialist   . Retroperitoneal bleed   . Acute respiratory failure with hypoxia ([place])   . Pressure injury of skin 06/18/2018  . Pulmonary embolism ([place]) 06/17/2018  . Malnutrition of moderate degree 05/07/2018  . Acute renal failure (ARF) ([place]) 05/05/2018  . Hypoglycemia without diagnosis of  diabetes mellitus 05/05/2018  . Dementia ([place]) 05/05/2018  . Evaluation by psychiatric service required   . GERD (gastroesophageal reflux disease) 04/15/2018  . UTI (urinary tract infection) 04/15/2018  . Atrial fibrillation, chronic 04/15/2018  . Depression 04/15/2018  . Fall 04/15/2018  . Tobacco abuse 04/15/2018  . Sepsis ([place]) 04/15/2018  . Acute metabolic encephalopathy 15/40/0867  . Hypertensive emergency 03/25/2018  . NSTEMI (non-ST elevated myocardial infarction) ([place])   . Troponin level elevated   . IVH (intraventricular hemorrhage) ([place]) 03/19/2018  . 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 Long term current use of amiodarone 03/27/2017  . Severe recurrent major depression without psychotic features ([place]) 11/19/2016    Class: Chronic  . Terminal care 09/13/2016  . Generalized headaches 07/04/2016  . Protein-calorie malnutrition, severe ([place]) 04/03/2015  . Chronic diastolic CHF (congestive heart failure), NYHA class 2 ([place]) 01/24/2015  . Demand ischemia secondary to AF with RVR 12/13/2014  . CKD (chronic kidney disease), stage III ([place]) 11/11/2014  . Hypokalemia 05/22/2014  . Mood disorder ([place]) 03/20/2014  . HTN (hypertension) 03/20/2014  . Anemia, unspecified 03/20/2014  . Smoker 03/20/2014  . Atrial fibrillation with RVR ([place]) 03/03/2014  . AKI (acute kidney injury) ([place]) 03/03/2014  . Hypotension 03/03/2014  . Pulmonary hypertension ([place]) 03/03/2014  . COPD (chronic obstructive pulmonary disease) ([place]) 03/03/2014  . H/O: CVA (cerebrovascular accident)   . Long-term (current) use of anticoagulants   . Cardiomyopathy-h/o tachycardia mediated-EF 65% per echo March 2016 02/18/2014  . Hyperlipidemia 02/18/2014  . CAD '07, LAD PCI 2012, SVG-PDA PTCA 11/10/14 02/16/2014  . Atrial fibrillation with rapid ventricular response s/p TEE-DCCV (02/17/14) 02/16/2014  . Chronic diastolic heart failure ([place])   . Pulmonary nodules   . Hypothyroidism   . OSA- C-pap intol      Palliative Care Assessment & Plan   Patient Profile: 62 y.o. female  with past medical history of recent ICH/IVH while on zarelto, afib, COPD, CHF, CVA, smoker, OSA, HTN, thyroid disease admitted on 06/17/2018 with hypoxia. CTA positive for acute PE with evidence of right heart strain. Patient with acute respiratory failure requiring intubation/mechanical ventilation, successfully extubated. Course of hospitalization complicated by  acute blood loss anemia secondary to retro/extraperitoneal hemorrhage/abdominal wall hematoma, oliguric acute renal failure, metabolic encephalopathy, atrial fibrillation with RVR, Klebsiella UTI, and pseudomonas PNA. Poor prognosis. Palliative medicine consultation for goals of care/terminal care.   Assessment: Acute pulmonary embolism Acute respiratory failure Retroperitoneal abdominal bleed with abdominal wall hematoma Anemia Oliguric acute renal failure Metabolic encephalopathy Atrial fib RVR Pseudomonas PNA Klebsiella UTI  Recommendations/Plan:  Comfort measures only.   Continue prn medications for symptom management. Scopolamine patch added for audible secretions/rhonchi.   SW consulted for residential hospice placement. Stable to transfer today if bed available.   Goals of Care and Additional Recommendations:  Limitations on Scope of Treatment: Full Comfort Care  Code Status: DNR/DNI   Code Status Orders  (From admission, onward)         Start     Ordered   07/05/18 0814  Do not attempt resuscitation (DNR)  Continuous    Question Answer Comment  In the event of cardiac or respiratory ARREST Do not call a "code blue"   In the event of cardiac or respiratory ARREST Do not perform Intubation, CPR, defibrillation or ACLS   In the event of cardiac or respiratory ARREST Use medication by any route, position, wound care, and other measures to relive pain and suffering. May use oxygen, suction and manual treatment of airway obstruction as  needed for comfort.      07/05/18 0814        Code Status History    Date Active Date Inactive Code Status Order ID Comments User Context   06/24/2018 0911 07/05/2018 0814 DNR [id number]  Rigoberto Noel, MD Inpatient   06/17/2018 2009 06/24/2018 0911 Full Code [id number]  Corey Harold, NP ED   05/05/2018 1620 05/09/2018 0012 Full Code [id number]  Karmen Bongo, MD ED   04/15/2018 0350 04/27/2018 2130 Full Code [id number]  Ivor Costa, MD ED   03/25/2018 2135 04/01/2018 1516 Full Code [id number]  Germain Osgood, PA-C ED   03/18/2018 0905 03/25/2018 0232 Full Code [id number]  Greta Doom, MD ED   04/01/2015 1603 04/03/2015 1630 Full Code [id number]  Donne Hazel, MD ED   12/10/2014 1925 12/14/2014 2015 Full Code [id number]  Samella Parr, NP Inpatient   11/10/2014 1848 11/11/2014 2059 Full Code [id number]  Leonie Man, MD Inpatient   10/11/2014 1409 10/14/2014 1711 DNR [id number]  Melton Alar, PA-C Inpatient   03/03/2014 1818 03/06/2014 2113 Full Code [id number]  Kinnie Feil, MD Inpatient   02/16/2014 1736 02/20/2014 1649 Full Code [id number]  Dorothy Spark, MD Inpatient    Advance Directive Documentation     Most Recent Value  Type of Advance Directive  Healthcare Power of Attorney, Living will  Pre-existing out of facility DNR order (yellow form or pink MOST form)  -  "MOST" Form in Place?  -       Prognosis:  Likely days with acute PE/DVT unable to anticoagulate, retroperitoneal/abdominal wall bleed, acute respiratory failure, acute renal failure, anemia, and extremely poor functional/cognitive/nutritional status.   Discharge Planning:  Hospice facility  Care plan was discussed with RN, son Mitzi Hansen), SW  Thank you for allowing the Palliative Medicine Team to assist in the care of this patient.   Time In: 1000 Time Out: 1015 Total Time 32mn Prolonged Time Billed  no       Greater than 50%  of this time was spent counseling and coordinating care related to the above  assessment and plan.  MJinny Blossom  Cornelia Copa, [facility] Team  Phone: [phone] Fax: [phone]  Please contact Palliative Medicine Team phone at [phone] for questions and concerns.

## 2018-07-07 NOTE — Clinical Social Work Note (Signed)
Admission ppwk is completed with [facility] and d/c summary has been faxed. [facility] has been called. RN please call report to [phone]. Clinical Social Worker will sign off for now as social work intervention is no longer needed. Please consult [place] again if new need arises.   [place], [place] [phone]

## 2018-07-10 DEATH — deceased
